# Patient Record
Sex: Female | Born: 1950
Health system: Southern US, Community
[De-identification: ages and names within clinical notes are randomized; demographics above are authoritative.]

## PROBLEM LIST (undated history)

## (undated) DIAGNOSIS — K635 Polyp of colon: Secondary | ICD-10-CM

## (undated) DIAGNOSIS — F329 Major depressive disorder, single episode, unspecified: Secondary | ICD-10-CM

## (undated) DIAGNOSIS — G4730 Sleep apnea, unspecified: Secondary | ICD-10-CM

## (undated) DIAGNOSIS — J189 Pneumonia, unspecified organism: Secondary | ICD-10-CM

## (undated) DIAGNOSIS — N189 Chronic kidney disease, unspecified: Secondary | ICD-10-CM

## (undated) DIAGNOSIS — F32A Depression, unspecified: Secondary | ICD-10-CM

## (undated) DIAGNOSIS — D151 Benign neoplasm of heart: Secondary | ICD-10-CM

## (undated) DIAGNOSIS — H269 Unspecified cataract: Secondary | ICD-10-CM

## (undated) DIAGNOSIS — E785 Hyperlipidemia, unspecified: Secondary | ICD-10-CM

## (undated) DIAGNOSIS — G061 Intraspinal abscess and granuloma: Secondary | ICD-10-CM

## (undated) DIAGNOSIS — K219 Gastro-esophageal reflux disease without esophagitis: Secondary | ICD-10-CM

## (undated) DIAGNOSIS — A4902 Methicillin resistant Staphylococcus aureus infection, unspecified site: Secondary | ICD-10-CM

## (undated) DIAGNOSIS — K5900 Constipation, unspecified: Secondary | ICD-10-CM

## (undated) DIAGNOSIS — Z803 Family history of malignant neoplasm of breast: Secondary | ICD-10-CM

## (undated) DIAGNOSIS — R0902 Hypoxemia: Secondary | ICD-10-CM

## (undated) DIAGNOSIS — Z8042 Family history of malignant neoplasm of prostate: Secondary | ICD-10-CM

## (undated) DIAGNOSIS — I639 Cerebral infarction, unspecified: Secondary | ICD-10-CM

## (undated) DIAGNOSIS — D509 Iron deficiency anemia, unspecified: Secondary | ICD-10-CM

## (undated) DIAGNOSIS — Z1379 Encounter for other screening for genetic and chromosomal anomalies: Principal | ICD-10-CM

## (undated) DIAGNOSIS — I2510 Atherosclerotic heart disease of native coronary artery without angina pectoris: Secondary | ICD-10-CM

## (undated) DIAGNOSIS — C50919 Malignant neoplasm of unspecified site of unspecified female breast: Secondary | ICD-10-CM

## (undated) DIAGNOSIS — I6609 Occlusion and stenosis of unspecified middle cerebral artery: Secondary | ICD-10-CM

## (undated) DIAGNOSIS — M1990 Unspecified osteoarthritis, unspecified site: Secondary | ICD-10-CM

## (undated) DIAGNOSIS — Z923 Personal history of irradiation: Secondary | ICD-10-CM

## (undated) DIAGNOSIS — I10 Essential (primary) hypertension: Secondary | ICD-10-CM

## (undated) DIAGNOSIS — Z8673 Personal history of transient ischemic attack (TIA), and cerebral infarction without residual deficits: Secondary | ICD-10-CM

## (undated) HISTORY — DX: Family history of malignant neoplasm of breast: Z80.3

## (undated) HISTORY — DX: Atherosclerotic heart disease of native coronary artery without angina pectoris: I25.10

## (undated) HISTORY — DX: Intraspinal abscess and granuloma: G06.1

## (undated) HISTORY — DX: Depression, unspecified: F32.A

## (undated) HISTORY — DX: Iron deficiency anemia, unspecified: D50.9

## (undated) HISTORY — DX: Unspecified cataract: H26.9

## (undated) HISTORY — DX: Polyp of colon: K63.5

## (undated) HISTORY — DX: Gastro-esophageal reflux disease without esophagitis: K21.9

## (undated) HISTORY — DX: Benign neoplasm of heart: D15.1

## (undated) HISTORY — DX: Sleep apnea, unspecified: G47.30

## (undated) HISTORY — DX: Personal history of transient ischemic attack (TIA), and cerebral infarction without residual deficits: Z86.73

## (undated) HISTORY — PX: OTHER SURGICAL HISTORY: SHX169

## (undated) HISTORY — DX: Major depressive disorder, single episode, unspecified: F32.9

## (undated) HISTORY — DX: Encounter for other screening for genetic and chromosomal anomalies: Z13.79

## (undated) HISTORY — DX: Occlusion and stenosis of unspecified middle cerebral artery: I66.09

## (undated) HISTORY — DX: Cerebral infarction, unspecified: I63.9

## (undated) HISTORY — DX: Essential (primary) hypertension: I10

## (undated) HISTORY — DX: Unspecified osteoarthritis, unspecified site: M19.90

## (undated) HISTORY — PX: VESICOVAGINAL FISTULA CLOSURE W/ TAH: SUR271

## (undated) HISTORY — DX: Hyperlipidemia, unspecified: E78.5

## (undated) HISTORY — PX: BREAST LUMPECTOMY: SHX2

## (undated) HISTORY — DX: Family history of malignant neoplasm of prostate: Z80.42

---

## 1969-06-27 HISTORY — PX: TONSILLECTOMY: SUR1361

## 1976-06-27 HISTORY — PX: TOTAL ABDOMINAL HYSTERECTOMY: SHX209

## 1998-01-20 ENCOUNTER — Encounter: Admission: RE | Admit: 1998-01-20 | Discharge: 1998-04-20 | Payer: Self-pay | Admitting: Family Medicine

## 1999-04-26 ENCOUNTER — Ambulatory Visit (HOSPITAL_COMMUNITY): Admission: RE | Admit: 1999-04-26 | Discharge: 1999-04-26 | Payer: Self-pay | Admitting: Orthopedic Surgery

## 1999-04-26 ENCOUNTER — Encounter: Payer: Self-pay | Admitting: Orthopedic Surgery

## 1999-05-18 ENCOUNTER — Ambulatory Visit (HOSPITAL_COMMUNITY): Admission: RE | Admit: 1999-05-18 | Discharge: 1999-05-18 | Payer: Self-pay | Admitting: Family Medicine

## 1999-05-18 ENCOUNTER — Encounter: Payer: Self-pay | Admitting: Family Medicine

## 1999-12-05 ENCOUNTER — Emergency Department (HOSPITAL_COMMUNITY): Admission: EM | Admit: 1999-12-05 | Discharge: 1999-12-05 | Payer: Self-pay | Admitting: Emergency Medicine

## 1999-12-05 ENCOUNTER — Encounter: Payer: Self-pay | Admitting: Emergency Medicine

## 2000-03-02 ENCOUNTER — Ambulatory Visit (HOSPITAL_COMMUNITY): Admission: RE | Admit: 2000-03-02 | Discharge: 2000-03-02 | Payer: Self-pay | Admitting: Family Medicine

## 2000-03-02 ENCOUNTER — Encounter: Payer: Self-pay | Admitting: Family Medicine

## 2000-12-28 ENCOUNTER — Encounter: Payer: Self-pay | Admitting: Emergency Medicine

## 2000-12-28 ENCOUNTER — Emergency Department (HOSPITAL_COMMUNITY): Admission: EM | Admit: 2000-12-28 | Discharge: 2000-12-29 | Payer: Self-pay | Admitting: Emergency Medicine

## 2002-03-11 ENCOUNTER — Emergency Department (HOSPITAL_COMMUNITY): Admission: EM | Admit: 2002-03-11 | Discharge: 2002-03-11 | Payer: Self-pay | Admitting: Emergency Medicine

## 2002-03-11 ENCOUNTER — Encounter: Payer: Self-pay | Admitting: Emergency Medicine

## 2003-08-15 ENCOUNTER — Emergency Department (HOSPITAL_COMMUNITY): Admission: EM | Admit: 2003-08-15 | Discharge: 2003-08-15 | Payer: Self-pay | Admitting: Emergency Medicine

## 2006-03-24 ENCOUNTER — Ambulatory Visit: Payer: Self-pay | Admitting: Internal Medicine

## 2006-03-24 ENCOUNTER — Inpatient Hospital Stay (HOSPITAL_COMMUNITY): Admission: EM | Admit: 2006-03-24 | Discharge: 2006-04-07 | Payer: Self-pay | Admitting: Emergency Medicine

## 2006-03-27 DIAGNOSIS — G061 Intraspinal abscess and granuloma: Secondary | ICD-10-CM

## 2006-03-27 HISTORY — DX: Intraspinal abscess and granuloma: G06.1

## 2006-04-04 ENCOUNTER — Encounter (INDEPENDENT_AMBULATORY_CARE_PROVIDER_SITE_OTHER): Payer: Self-pay | Admitting: Specialist

## 2006-05-05 ENCOUNTER — Ambulatory Visit: Payer: Self-pay | Admitting: Internal Medicine

## 2006-05-05 ENCOUNTER — Encounter (INDEPENDENT_AMBULATORY_CARE_PROVIDER_SITE_OTHER): Payer: Self-pay | Admitting: Internal Medicine

## 2006-05-05 LAB — CONVERTED CEMR LAB
BUN: 12 mg/dL (ref 6–23)
Chloride: 106 meq/L (ref 96–112)
Creatinine, Ser: 0.7 mg/dL (ref 0.40–1.20)
HCT: 34.6 % (ref 34.4–43.3)
MCHC: 33.5 g/dL (ref 33.1–35.4)
Platelets: 250 10*3/uL (ref 152–374)
Potassium: 4.6 meq/L (ref 3.5–5.3)
RDW: 14.7 % (ref 11.5–15.3)

## 2006-09-29 ENCOUNTER — Encounter: Admission: RE | Admit: 2006-09-29 | Discharge: 2006-09-29 | Payer: Self-pay | Admitting: *Deleted

## 2006-09-29 ENCOUNTER — Ambulatory Visit: Payer: Self-pay | Admitting: *Deleted

## 2006-09-29 ENCOUNTER — Ambulatory Visit: Payer: Self-pay | Admitting: Internal Medicine

## 2006-09-29 ENCOUNTER — Inpatient Hospital Stay (HOSPITAL_COMMUNITY): Admission: AD | Admit: 2006-09-29 | Discharge: 2006-10-04 | Payer: Self-pay | Admitting: Internal Medicine

## 2006-09-29 DIAGNOSIS — D631 Anemia in chronic kidney disease: Secondary | ICD-10-CM | POA: Insufficient documentation

## 2006-09-29 DIAGNOSIS — E119 Type 2 diabetes mellitus without complications: Secondary | ICD-10-CM | POA: Insufficient documentation

## 2006-09-29 DIAGNOSIS — L03818 Cellulitis of other sites: Secondary | ICD-10-CM

## 2006-09-29 DIAGNOSIS — D509 Iron deficiency anemia, unspecified: Secondary | ICD-10-CM

## 2006-09-29 DIAGNOSIS — L02818 Cutaneous abscess of other sites: Secondary | ICD-10-CM

## 2006-09-29 DIAGNOSIS — I10 Essential (primary) hypertension: Secondary | ICD-10-CM

## 2006-09-29 LAB — CONVERTED CEMR LAB
Blood Glucose, Fingerstick: 341
Hgb A1c MFr Bld: 9.4 %

## 2006-10-12 ENCOUNTER — Ambulatory Visit: Payer: Self-pay | Admitting: Internal Medicine

## 2006-10-12 LAB — CONVERTED CEMR LAB: Blood Glucose, Fingerstick: 174

## 2006-10-17 ENCOUNTER — Ambulatory Visit: Payer: Self-pay | Admitting: Internal Medicine

## 2006-12-14 ENCOUNTER — Encounter (INDEPENDENT_AMBULATORY_CARE_PROVIDER_SITE_OTHER): Payer: Self-pay | Admitting: Internal Medicine

## 2007-03-30 ENCOUNTER — Encounter: Admission: RE | Admit: 2007-03-30 | Discharge: 2007-03-30 | Payer: Self-pay | Admitting: Internal Medicine

## 2007-05-19 ENCOUNTER — Emergency Department (HOSPITAL_COMMUNITY): Admission: EM | Admit: 2007-05-19 | Discharge: 2007-05-19 | Payer: Self-pay | Admitting: Family Medicine

## 2007-08-20 ENCOUNTER — Encounter: Admission: RE | Admit: 2007-08-20 | Discharge: 2007-08-20 | Payer: Self-pay | Admitting: Internal Medicine

## 2009-01-07 ENCOUNTER — Emergency Department (HOSPITAL_COMMUNITY): Admission: EM | Admit: 2009-01-07 | Discharge: 2009-01-07 | Payer: Self-pay | Admitting: Emergency Medicine

## 2009-10-19 ENCOUNTER — Encounter: Admission: RE | Admit: 2009-10-19 | Discharge: 2009-10-19 | Payer: Self-pay | Admitting: Internal Medicine

## 2009-12-12 ENCOUNTER — Ambulatory Visit: Payer: Self-pay | Admitting: Surgery

## 2009-12-21 ENCOUNTER — Ambulatory Visit: Payer: Self-pay | Admitting: Cardiology

## 2009-12-21 ENCOUNTER — Inpatient Hospital Stay (HOSPITAL_COMMUNITY)
Admission: EM | Admit: 2009-12-21 | Discharge: 2009-12-30 | Payer: Self-pay | Source: Home / Self Care | Admitting: Emergency Medicine

## 2009-12-22 ENCOUNTER — Encounter (INDEPENDENT_AMBULATORY_CARE_PROVIDER_SITE_OTHER): Payer: Self-pay | Admitting: Internal Medicine

## 2009-12-23 ENCOUNTER — Encounter: Payer: Self-pay | Admitting: Cardiology

## 2009-12-24 ENCOUNTER — Ambulatory Visit: Payer: Self-pay | Admitting: Physical Medicine & Rehabilitation

## 2010-02-08 ENCOUNTER — Ambulatory Visit: Payer: Self-pay | Admitting: Internal Medicine

## 2010-02-08 LAB — CONVERTED CEMR LAB
ALT: 13 units/L (ref 0–35)
AST: 17 units/L (ref 0–37)
Alkaline Phosphatase: 48 units/L (ref 39–117)
Anticardiolipin IgM: 1 (ref <11)
BUN: 22 mg/dL (ref 6–23)
Basophils Absolute: 0 10*3/uL (ref 0.0–0.1)
Basophils Relative: 1 % (ref 0–1)
Calcium: 9.5 mg/dL (ref 8.4–10.5)
Creatinine, Ser: 0.99 mg/dL (ref 0.40–1.20)
Eosinophils Absolute: 0.1 10*3/uL (ref 0.0–0.7)
Eosinophils Relative: 1 % (ref 0–5)
Hemoglobin: 11.8 g/dL — ABNORMAL LOW (ref 12.0–15.0)
MCHC: 31.3 g/dL (ref 30.0–36.0)
MCV: 82.9 fL (ref 78.0–100.0)
Monocytes Absolute: 0.3 10*3/uL (ref 0.1–1.0)
Monocytes Relative: 5 % (ref 3–12)
Neutro Abs: 2.9 10*3/uL (ref 1.7–7.7)
RBC: 4.55 M/uL (ref 3.87–5.11)
RDW: 15.1 % (ref 11.5–15.5)
Total Bilirubin: 0.4 mg/dL (ref 0.3–1.2)

## 2010-03-12 ENCOUNTER — Encounter (INDEPENDENT_AMBULATORY_CARE_PROVIDER_SITE_OTHER): Payer: Self-pay | Admitting: Internal Medicine

## 2010-03-12 LAB — CONVERTED CEMR LAB
ALT: 12 units/L (ref 0–35)
AST: 16 units/L (ref 0–37)
CO2: 24 meq/L (ref 19–32)
Calcium: 9.1 mg/dL (ref 8.4–10.5)
Chloride: 110 meq/L (ref 96–112)
Creatinine, Ser: 0.94 mg/dL (ref 0.40–1.20)
INR: 1.05 (ref <1.50)
Potassium: 5 meq/L (ref 3.5–5.3)
Prothrombin Time: 13.9 s (ref 11.6–15.2)
Sodium: 143 meq/L (ref 135–145)
Total CHOL/HDL Ratio: 2.9
Total Protein: 6.6 g/dL (ref 6.0–8.3)
VLDL: 19 mg/dL (ref 0–40)

## 2010-03-16 ENCOUNTER — Encounter (INDEPENDENT_AMBULATORY_CARE_PROVIDER_SITE_OTHER): Payer: Self-pay | Admitting: Internal Medicine

## 2010-03-16 LAB — CONVERTED CEMR LAB
INR: 1.05 (ref <1.50)
Prothrombin Time: 13.9 s (ref 11.6–15.2)

## 2010-05-28 ENCOUNTER — Encounter (INDEPENDENT_AMBULATORY_CARE_PROVIDER_SITE_OTHER): Payer: Self-pay | Admitting: Family Medicine

## 2010-05-28 LAB — CONVERTED CEMR LAB
ALT: 16 units/L (ref 0–35)
AST: 17 units/L (ref 0–37)
Albumin: 4.1 g/dL (ref 3.5–5.2)
CO2: 26 meq/L (ref 19–32)
Calcium: 9 mg/dL (ref 8.4–10.5)
Chloride: 107 meq/L (ref 96–112)
Cholesterol: 237 mg/dL — ABNORMAL HIGH (ref 0–200)
Hemoglobin: 10.9 g/dL — ABNORMAL LOW (ref 12.0–15.0)
Platelets: 268 10*3/uL (ref 150–400)
Potassium: 4.7 meq/L (ref 3.5–5.3)
RDW: 15.6 % — ABNORMAL HIGH (ref 11.5–15.5)
WBC: 7.4 10*3/uL (ref 4.0–10.5)

## 2010-06-03 ENCOUNTER — Emergency Department (HOSPITAL_COMMUNITY): Admission: EM | Admit: 2010-06-03 | Discharge: 2009-10-05 | Payer: Self-pay | Admitting: Emergency Medicine

## 2010-06-04 ENCOUNTER — Inpatient Hospital Stay (HOSPITAL_COMMUNITY)
Admission: EM | Admit: 2010-06-04 | Discharge: 2010-06-09 | Payer: Self-pay | Source: Home / Self Care | Attending: Internal Medicine | Admitting: Internal Medicine

## 2010-06-04 ENCOUNTER — Encounter: Payer: Self-pay | Admitting: Cardiology

## 2010-06-07 ENCOUNTER — Encounter (INDEPENDENT_AMBULATORY_CARE_PROVIDER_SITE_OTHER): Payer: Self-pay | Admitting: Internal Medicine

## 2010-06-23 ENCOUNTER — Ambulatory Visit (HOSPITAL_COMMUNITY)
Admission: RE | Admit: 2010-06-23 | Discharge: 2010-06-23 | Payer: Self-pay | Source: Home / Self Care | Attending: Cardiology | Admitting: Cardiology

## 2010-06-23 ENCOUNTER — Encounter (INDEPENDENT_AMBULATORY_CARE_PROVIDER_SITE_OTHER): Payer: Self-pay | Admitting: *Deleted

## 2010-06-27 DIAGNOSIS — D151 Benign neoplasm of heart: Secondary | ICD-10-CM

## 2010-06-27 HISTORY — PX: CORONARY ARTERY BYPASS GRAFT: SHX141

## 2010-06-27 HISTORY — DX: Benign neoplasm of heart: D15.1

## 2010-06-27 HISTORY — PX: OTHER SURGICAL HISTORY: SHX169

## 2010-06-29 ENCOUNTER — Ambulatory Visit
Admission: RE | Admit: 2010-06-29 | Discharge: 2010-06-29 | Payer: Self-pay | Source: Home / Self Care | Attending: Cardiology | Admitting: Cardiology

## 2010-06-29 ENCOUNTER — Other Ambulatory Visit: Payer: Self-pay | Admitting: Cardiology

## 2010-06-29 DIAGNOSIS — E7800 Pure hypercholesterolemia, unspecified: Secondary | ICD-10-CM | POA: Insufficient documentation

## 2010-06-29 DIAGNOSIS — G459 Transient cerebral ischemic attack, unspecified: Secondary | ICD-10-CM | POA: Insufficient documentation

## 2010-06-29 DIAGNOSIS — E785 Hyperlipidemia, unspecified: Secondary | ICD-10-CM | POA: Insufficient documentation

## 2010-06-29 LAB — PROTIME-INR
INR: 1.1 ratio — ABNORMAL HIGH (ref 0.8–1.0)
Prothrombin Time: 12 s — ABNORMAL HIGH (ref 9.7–11.8)

## 2010-07-12 ENCOUNTER — Ambulatory Visit
Admission: RE | Admit: 2010-07-12 | Discharge: 2010-07-12 | Payer: Self-pay | Source: Home / Self Care | Attending: Thoracic Surgery (Cardiothoracic Vascular Surgery) | Admitting: Thoracic Surgery (Cardiothoracic Vascular Surgery)

## 2010-07-14 ENCOUNTER — Encounter
Admission: RE | Admit: 2010-07-14 | Discharge: 2010-07-14 | Payer: Self-pay | Source: Home / Self Care | Attending: Dentistry | Admitting: Dentistry

## 2010-07-14 ENCOUNTER — Encounter: Payer: Self-pay | Admitting: Cardiology

## 2010-07-18 ENCOUNTER — Encounter: Payer: Self-pay | Admitting: Internal Medicine

## 2010-07-19 ENCOUNTER — Inpatient Hospital Stay (HOSPITAL_COMMUNITY)
Admission: RE | Admit: 2010-07-19 | Discharge: 2010-07-28 | DRG: 229 | Disposition: A | Discharge status: Home Health | Payer: Medicaid Other | Attending: Thoracic Surgery (Cardiothoracic Vascular Surgery) | Admitting: Thoracic Surgery (Cardiothoracic Vascular Surgery)

## 2010-07-19 DIAGNOSIS — G4733 Obstructive sleep apnea (adult) (pediatric): Secondary | ICD-10-CM | POA: Diagnosis present

## 2010-07-19 DIAGNOSIS — I10 Essential (primary) hypertension: Secondary | ICD-10-CM | POA: Diagnosis present

## 2010-07-19 DIAGNOSIS — Z7982 Long term (current) use of aspirin: Secondary | ICD-10-CM

## 2010-07-19 DIAGNOSIS — D62 Acute posthemorrhagic anemia: Secondary | ICD-10-CM | POA: Diagnosis not present

## 2010-07-19 DIAGNOSIS — E785 Hyperlipidemia, unspecified: Secondary | ICD-10-CM | POA: Diagnosis present

## 2010-07-19 DIAGNOSIS — F17200 Nicotine dependence, unspecified, uncomplicated: Secondary | ICD-10-CM | POA: Diagnosis present

## 2010-07-19 DIAGNOSIS — I424 Endocardial fibroelastosis: Secondary | ICD-10-CM | POA: Diagnosis present

## 2010-07-19 DIAGNOSIS — Z7901 Long term (current) use of anticoagulants: Secondary | ICD-10-CM

## 2010-07-19 DIAGNOSIS — I2510 Atherosclerotic heart disease of native coronary artery without angina pectoris: Principal | ICD-10-CM | POA: Diagnosis present

## 2010-07-19 DIAGNOSIS — E1169 Type 2 diabetes mellitus with other specified complication: Secondary | ICD-10-CM | POA: Diagnosis present

## 2010-07-19 DIAGNOSIS — D696 Thrombocytopenia, unspecified: Secondary | ICD-10-CM | POA: Diagnosis not present

## 2010-07-19 DIAGNOSIS — Z8673 Personal history of transient ischemic attack (TIA), and cerebral infarction without residual deficits: Secondary | ICD-10-CM

## 2010-07-20 LAB — COMPREHENSIVE METABOLIC PANEL
ALT: 17 U/L (ref 0–35)
AST: 20 U/L (ref 0–37)
CO2: 25 mEq/L (ref 19–32)
Chloride: 107 mEq/L (ref 96–112)
GFR calc Af Amer: >60 mL/min (ref >60)
GFR calc non Af Amer: 58 mL/min — ABNORMAL LOW (ref >60)
Glucose, Bld: 138 mg/dL — ABNORMAL HIGH (ref 70–99)
Sodium: 140 mEq/L (ref 135–145)
Total Bilirubin: 0.7 mg/dL (ref 0.3–1.2)

## 2010-07-20 LAB — GLUCOSE, CAPILLARY
Glucose-Capillary: 137 mg/dL — ABNORMAL HIGH (ref 70–99)
Glucose-Capillary: 130 mg/dL — ABNORMAL HIGH (ref 70–99)
Glucose-Capillary: 194 mg/dL — ABNORMAL HIGH (ref 70–99)

## 2010-07-20 LAB — PROTIME-INR
INR: 0.89 (ref 0.00–1.49)
Prothrombin Time: 12.2 seconds (ref 11.6–15.2)

## 2010-07-20 LAB — CBC
Hemoglobin: 11.2 g/dL — ABNORMAL LOW (ref 12.0–15.0)
RBC: 4.27 MIL/uL (ref 3.87–5.11)

## 2010-07-20 LAB — DIFFERENTIAL
Basophils Absolute: 0 10*3/uL (ref 0.0–0.1)
Basophils Relative: 1 % (ref 0–1)
Neutro Abs: 2.9 10*3/uL (ref 1.7–7.7)
Neutrophils Relative %: 47 % (ref 43–77)

## 2010-07-21 ENCOUNTER — Other Ambulatory Visit: Payer: Self-pay | Admitting: Thoracic Surgery (Cardiothoracic Vascular Surgery)

## 2010-07-21 LAB — BLOOD GAS, ARTERIAL
Acid-base deficit: 1.3 mmol/L (ref 0.0–2.0)
Bicarbonate: 22.1 mEq/L (ref 20.0–24.0)
FIO2: 0.21 %
O2 Saturation: 96.7 %
pO2, Arterial: 88.9 mmHg (ref 80.0–100.0)

## 2010-07-21 LAB — POCT I-STAT 4, (NA,K, GLUC, HGB,HCT)
Glucose, Bld: 125 mg/dL — ABNORMAL HIGH (ref 70–99)
Glucose, Bld: 100 mg/dL — ABNORMAL HIGH (ref 70–99)
HCT: 33 % — ABNORMAL LOW (ref 36.0–46.0)
HCT: 23 % — ABNORMAL LOW (ref 36.0–46.0)
HCT: 21 % — ABNORMAL LOW (ref 36.0–46.0)
Hemoglobin: 7.8 g/dL — ABNORMAL LOW (ref 12.0–15.0)
Hemoglobin: 7.8 g/dL — ABNORMAL LOW (ref 12.0–15.0)
Potassium: 4.2 mEq/L (ref 3.5–5.1)
Potassium: 4.2 mEq/L (ref 3.5–5.1)
Potassium: 4.1 mEq/L (ref 3.5–5.1)
Potassium: 3.7 mEq/L (ref 3.5–5.1)
Sodium: 136 mEq/L (ref 135–145)
Sodium: 138 mEq/L (ref 135–145)
Sodium: 137 mEq/L (ref 135–145)
Sodium: 138 mEq/L (ref 135–145)
Sodium: 140 mEq/L (ref 135–145)

## 2010-07-21 LAB — URINALYSIS, ROUTINE W REFLEX MICROSCOPIC
Nitrite: NEGATIVE
Protein, ur: NEGATIVE mg/dL
Specific Gravity, Urine: 1.022 (ref 1.005–1.030)
Urobilinogen, UA: 1 mg/dL (ref 0.0–1.0)

## 2010-07-21 LAB — POCT I-STAT 3, ART BLOOD GAS (G3+)
Acid-base deficit: 2 mmol/L (ref 0.0–2.0)
Acid-base deficit: 5 mmol/L — ABNORMAL HIGH (ref 0.0–2.0)
O2 Saturation: 100 %
O2 Saturation: 97 %
Patient temperature: 37.3
pCO2 arterial: 38.5 mmHg (ref 35.0–45.0)
pO2, Arterial: 552 mmHg — ABNORMAL HIGH (ref 80.0–100.0)
pO2, Arterial: 354 mmHg — ABNORMAL HIGH (ref 80.0–100.0)

## 2010-07-21 LAB — CBC
HCT: 30.2 % — ABNORMAL LOW (ref 36.0–46.0)
Hemoglobin: 11 g/dL — ABNORMAL LOW (ref 12.0–15.0)
MCH: 26.2 pg (ref 26.0–34.0)
MCHC: 32.6 g/dL (ref 30.0–36.0)
MCHC: 33.8 g/dL (ref 30.0–36.0)
MCV: 81.4 fL (ref 78.0–100.0)
Platelets: 127 10*3/uL — ABNORMAL LOW (ref 150–400)
Platelets: 130 10*3/uL — ABNORMAL LOW (ref 150–400)
RBC: 2.76 MIL/uL — ABNORMAL LOW (ref 3.87–5.11)
RDW: 14.8 % (ref 11.5–15.5)
RDW: 14.6 % (ref 11.5–15.5)
RDW: 14.6 % (ref 11.5–15.5)
WBC: 6.1 10*3/uL (ref 4.0–10.5)
WBC: 10.2 10*3/uL (ref 4.0–10.5)

## 2010-07-21 LAB — COMPREHENSIVE METABOLIC PANEL
ALT: 16 U/L (ref 0–35)
Alkaline Phosphatase: 44 U/L (ref 39–117)
CO2: 22 mEq/L (ref 19–32)
GFR calc non Af Amer: >60 mL/min (ref >60)
Glucose, Bld: 139 mg/dL — ABNORMAL HIGH (ref 70–99)
Potassium: 4.8 mEq/L (ref 3.5–5.1)
Sodium: 140 mEq/L (ref 135–145)
Total Bilirubin: 0.5 mg/dL (ref 0.3–1.2)

## 2010-07-21 LAB — URINE MICROSCOPIC-ADD ON

## 2010-07-21 LAB — GLUCOSE, CAPILLARY
Glucose-Capillary: 116 mg/dL — ABNORMAL HIGH (ref 70–99)
Glucose-Capillary: 211 mg/dL — ABNORMAL HIGH (ref 70–99)
Glucose-Capillary: 172 mg/dL — ABNORMAL HIGH (ref 70–99)
Glucose-Capillary: 141 mg/dL — ABNORMAL HIGH (ref 70–99)
Glucose-Capillary: 139 mg/dL — ABNORMAL HIGH (ref 70–99)
Glucose-Capillary: 93 mg/dL (ref 70–99)
Glucose-Capillary: 98 mg/dL (ref 70–99)
Glucose-Capillary: 134 mg/dL — ABNORMAL HIGH (ref 70–99)
Glucose-Capillary: 180 mg/dL — ABNORMAL HIGH (ref 70–99)

## 2010-07-21 LAB — GLUCOSE, POCT (MANUAL RESULT ENTRY): Operator id: 3408

## 2010-07-21 LAB — POCT I-STAT, CHEM 8
Calcium, Ion: 1.12 mmol/L (ref 1.12–1.32)
Glucose, Bld: 190 mg/dL — ABNORMAL HIGH (ref 70–99)
HCT: 31 % — ABNORMAL LOW (ref 36.0–46.0)
TCO2: 21 mmol/L (ref 0–100)

## 2010-07-21 LAB — BASIC METABOLIC PANEL
BUN: 16 mg/dL (ref 6–23)
Calcium: 8.9 mg/dL (ref 8.4–10.5)
Creatinine, Ser: 0.94 mg/dL (ref 0.4–1.2)
GFR calc non Af Amer: >60 mL/min (ref >60)
Glucose, Bld: 188 mg/dL — ABNORMAL HIGH (ref 70–99)
Potassium: 4.2 mEq/L (ref 3.5–5.1)

## 2010-07-21 LAB — APTT: aPTT: 30 seconds (ref 24–37)

## 2010-07-21 LAB — CREATININE, SERUM
Creatinine, Ser: 0.78 mg/dL (ref 0.4–1.2)
GFR calc non Af Amer: >60 mL/min (ref >60)

## 2010-07-21 LAB — MRSA PCR SCREENING: MRSA by PCR: NEGATIVE

## 2010-07-21 LAB — ABO/RH: ABO/RH(D): AB POS

## 2010-07-21 LAB — PROTIME-INR
INR: 1.27 (ref 0.00–1.49)
Prothrombin Time: 16.1 seconds — ABNORMAL HIGH (ref 11.6–15.2)

## 2010-07-21 LAB — PLATELET COUNT: Platelets: 134 10*3/uL — ABNORMAL LOW (ref 150–400)

## 2010-07-21 LAB — HEMOGLOBIN AND HEMATOCRIT, BLOOD: HCT: 21.1 % — ABNORMAL LOW (ref 36.0–46.0)

## 2010-07-22 LAB — POCT I-STAT, CHEM 8
Glucose, Bld: 114 mg/dL — ABNORMAL HIGH (ref 70–99)
HCT: 33 % — ABNORMAL LOW (ref 36.0–46.0)
Hemoglobin: 11.2 g/dL — ABNORMAL LOW (ref 12.0–15.0)
Potassium: 4 mEq/L (ref 3.5–5.1)
TCO2: 25 mmol/L (ref 0–100)

## 2010-07-22 LAB — CBC
Hemoglobin: 10.5 g/dL — ABNORMAL LOW (ref 12.0–15.0)
MCHC: 33.1 g/dL (ref 30.0–36.0)
MCV: 81.5 fL (ref 78.0–100.0)
Platelets: 140 10*3/uL — ABNORMAL LOW (ref 150–400)
Platelets: 146 10*3/uL — ABNORMAL LOW (ref 150–400)
RBC: 3.67 MIL/uL — ABNORMAL LOW (ref 3.87–5.11)
RDW: 14.5 % (ref 11.5–15.5)
RDW: 14.9 % (ref 11.5–15.5)
WBC: 12.8 10*3/uL — ABNORMAL HIGH (ref 4.0–10.5)

## 2010-07-22 LAB — GLUCOSE, CAPILLARY
Glucose-Capillary: 156 mg/dL — ABNORMAL HIGH (ref 70–99)
Glucose-Capillary: 112 mg/dL — ABNORMAL HIGH (ref 70–99)

## 2010-07-22 LAB — BASIC METABOLIC PANEL
Chloride: 113 mEq/L — ABNORMAL HIGH (ref 96–112)
GFR calc Af Amer: >60 mL/min (ref >60)
GFR calc non Af Amer: >60 mL/min (ref >60)
Potassium: 4.5 mEq/L (ref 3.5–5.1)
Sodium: 141 mEq/L (ref 135–145)

## 2010-07-22 LAB — POCT I-STAT 3, ART BLOOD GAS (G3+)
Acid-base deficit: 4 mmol/L — ABNORMAL HIGH (ref 0.0–2.0)
Bicarbonate: 20.3 mEq/L (ref 20.0–24.0)
O2 Saturation: 94 %
pO2, Arterial: 68 mmHg — ABNORMAL LOW (ref 80.0–100.0)

## 2010-07-22 LAB — TYPE AND SCREEN
ABO/RH(D): AB POS
Unit division: 0

## 2010-07-22 LAB — CREATININE, SERUM
Creatinine, Ser: 0.97 mg/dL (ref 0.4–1.2)
GFR calc Af Amer: >60 mL/min (ref >60)

## 2010-07-22 LAB — MAGNESIUM: Magnesium: 3 mg/dL — ABNORMAL HIGH (ref 1.5–2.5)

## 2010-07-22 NOTE — Consult Note (Addendum)
  [name], [name]              ACCOUNT NO.:  [id number]  MEDICAL RECORD NO.:  [id number]          PATIENT TYPE:  INP  LOCATION:  2009                         FACILITY:  [place]  PHYSICIAN:  Minus Breeding, MD, FACCDATE OF BIRTH:  1950/08/18  DATE OF CONSULTATION:  07/19/2010 DATE OF DISCHARGE:                                CONSULTATION   PRIMARY:  [name], MD  CARDIOLOGIST:  [name]. [name], MD, [facility]  PROCEDURE:  Left and right heart catheterization.  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  PROCEDURE NOTE:  Left heart catheterization was performed via the left femoral artery, right heart catheterization was performed via the left femoral vein.  Both vessels were cannulated using anterior wall puncture.  A #5-French arterial sheath and a #7-French venous sheath were inserted via the modified Seldinger technique.  Preformed Judkins and pigtail catheter were utilized.  The patient tolerated the procedure well and left the lab in stable condition.  RESULTS:  Hemodynamics:  RA mean 8, RV 45/6, PA 29/14 with a mean of 20, pulmonary capillary pressure mean 10, aorta 143/72.  Coronaries:  The left main was normal.  The LAD had proximal luminal irregularities.  There were luminal irregularities throughout the mid and distal vessel.  First diagonal was moderate sized with proximal 25% stenosis.  Second diagonal was moderate sized with long 70% stenosis. The circumflex had a long proximal 30-40% lesions.  There was a mid obtuse marginal which was large and branching with long proximal 40% stenosis.  Right coronary artery was a dominant vessel.  It was occluded in the distal segment before the PDA.  The PDA was large and appeared to be free of high-grade disease.  There was reasonable collateral flow from the LAD, circumflex to the right coronary artery.  Left ventricle:  The left ventricle was not injected and the valve was not crossed  secondary to the fibroelastoma.  CONCLUSION:  Known mitral valve fibroelastoma.  Two-vessel coronary artery disease including diagonal and right coronary artery.  PLAN:  The patient will have mitral valve repair and CABG by Dr. [name].     Minus Breeding, MD, [facility]     JH/MEDQ  D:  07/19/2010  T:  07/20/2010  Job:  [id number]  Electronically Signed by Minus Breeding MD [facility] on 07/22/2010 12:40:56 PM

## 2010-07-23 LAB — BASIC METABOLIC PANEL
CO2: 26 mEq/L (ref 19–32)
Calcium: 8.4 mg/dL (ref 8.4–10.5)
Chloride: 105 mEq/L (ref 96–112)
Creatinine, Ser: 0.97 mg/dL (ref 0.4–1.2)
Glucose, Bld: 111 mg/dL — ABNORMAL HIGH (ref 70–99)

## 2010-07-23 LAB — CBC
MCH: 27.2 pg (ref 26.0–34.0)
MCHC: 33.1 g/dL (ref 30.0–36.0)
MCV: 82.1 fL (ref 78.0–100.0)
Platelets: 130 10*3/uL — ABNORMAL LOW (ref 150–400)
RDW: 15.1 % (ref 11.5–15.5)
WBC: 11.5 10*3/uL — ABNORMAL HIGH (ref 4.0–10.5)

## 2010-07-23 LAB — GLUCOSE, CAPILLARY: Glucose-Capillary: 130 mg/dL — ABNORMAL HIGH (ref 70–99)

## 2010-07-24 LAB — GLUCOSE, CAPILLARY
Glucose-Capillary: 59 mg/dL — ABNORMAL LOW (ref 70–99)
Glucose-Capillary: 115 mg/dL — ABNORMAL HIGH (ref 70–99)
Glucose-Capillary: 32 mg/dL — CL (ref 70–99)
Glucose-Capillary: 45 mg/dL — ABNORMAL LOW (ref 70–99)

## 2010-07-24 LAB — BASIC METABOLIC PANEL
Chloride: 105 mEq/L (ref 96–112)
GFR calc non Af Amer: 57 mL/min — ABNORMAL LOW (ref >60)
Glucose, Bld: 68 mg/dL — ABNORMAL LOW (ref 70–99)
Potassium: 3.9 mEq/L (ref 3.5–5.1)
Sodium: 139 mEq/L (ref 135–145)

## 2010-07-24 LAB — CBC
HCT: 29.3 % — ABNORMAL LOW (ref 36.0–46.0)
Hemoglobin: 9.6 g/dL — ABNORMAL LOW (ref 12.0–15.0)
MCV: 84 fL (ref 78.0–100.0)
RBC: 3.49 MIL/uL — ABNORMAL LOW (ref 3.87–5.11)
WBC: 10.9 10*3/uL — ABNORMAL HIGH (ref 4.0–10.5)

## 2010-07-25 LAB — GLUCOSE, CAPILLARY
Glucose-Capillary: 42 mg/dL — CL (ref 70–99)
Glucose-Capillary: 157 mg/dL — ABNORMAL HIGH (ref 70–99)
Glucose-Capillary: 101 mg/dL — ABNORMAL HIGH (ref 70–99)

## 2010-07-25 LAB — PROTIME-INR: INR: 0.97 (ref 0.00–1.49)

## 2010-07-26 LAB — GLUCOSE, CAPILLARY
Glucose-Capillary: 58 mg/dL — ABNORMAL LOW (ref 70–99)
Glucose-Capillary: 51 mg/dL — ABNORMAL LOW (ref 70–99)
Glucose-Capillary: 105 mg/dL — ABNORMAL HIGH (ref 70–99)
Glucose-Capillary: 114 mg/dL — ABNORMAL HIGH (ref 70–99)

## 2010-07-27 LAB — PROTIME-INR
INR: 1.08 (ref 0.00–1.49)
Prothrombin Time: 14.2 seconds (ref 11.6–15.2)

## 2010-07-27 LAB — GLUCOSE, CAPILLARY: Glucose-Capillary: 180 mg/dL — ABNORMAL HIGH (ref 70–99)

## 2010-07-28 LAB — GLUCOSE, CAPILLARY

## 2010-07-28 LAB — PROTIME-INR
INR: 1.16 (ref 0.00–1.49)
Prothrombin Time: 15 seconds (ref 11.6–15.2)

## 2010-07-29 NOTE — Discharge Summary (Signed)
[name], [name]              ACCOUNT NO.:  [id number]  MEDICAL RECORD NO.:  [id number]          PATIENT TYPE:  INP  LOCATION:  2017                         FACILITY:  [place]  PHYSICIAN:  Valentina Gu. [name], M.D. DATE OF BIRTH:  05-14-1951  DATE OF ADMISSION:  07/19/2010 DATE OF DISCHARGE:  07/27/2010                              DISCHARGE SUMMARY   ADMITTING DIAGNOSES: 1. Coronary artery vessel disease. 2. Mitral valve mass. 3. 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 4. History of type 2 diabetes mellitus. 5. History of hyperlipidemia. 6. History of hypertension. 7. History of tobacco abuse.  DISCHARGE DIAGNOSES: 1. Coronary artery vessel disease. 2. Mitral valve mass. 3. 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 4. History of type 2 diabetes mellitus. 5. History of hyperlipidemia. 6. History of hypertension. 7. History of tobacco abuse. 8. Acute blood loss anemia. 9. Mild postoperative thrombocytopenia.  PROCEDURE:  Median sternotomy for resection of mitral valve mass, mitral valve repair using CorMatrix patch, CABG x2 (SVG to diagonal 1, SVG to posterior descending coronary artery, coronary endarterectomy of the posterior descending coronary artery), EVH from the right thigh by Dr. [name] on July 21, 2010.  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  HISTORY OF PRESENT ILLNESS:  This is a 60 year old African American female with the aforementioned past medical history, the most recent was hospitalized in June 2011 at [facility] for an acute embolic stroke. MRI and MRA of the brain revealed multiple puncture with areas of the right posterior frontal, right parietal and right occipital lobes consistent with an acute embolic infarction.  There were other scattered areas of small vessel disease as well.  MRA revealed what was felt to be a high-grade stenosis of the proximal right  middle cerebral artery.  TEE done at that time revealed the presence of a small mass attached in the atrial surface of the middle scalp with the posterior leaflet of mitral valve (likely consistent with a benign tumor).  The patient was eventually discharged home on Coumadin as well as physical therapy.  She gradually recovered from her initial stroke, although she did present in December with episodes of increased slurred speech as well as numbness and clumsiness of the left hand.  These symptoms apparently were transient and it was found that the patient had not been noncompliant with Coumadin at that time.  A repeat MRI did not reveal any new findings.  Again, the patient's symptoms resolved and she was discharged home.  She was then seen by Dr. Shawn Stall  with a [facility] and was eventually referred to Dr. [name] regarding further followup of the mitral valve mass.  Repeat transesophageal echocardiogram was done on December 28.  This confirmed the presence of a mass in the atrial surface of posterior leaflet of mitral valve suspicious for a benign papillary fibroelastoma.  There was mild mitral regurgitation, normal left ventricular function and there were no other abnormalities noted.  The patient was then seen in consultation by Dr. [name] on July 12, 2010.  A long discussion was had with the patient regarding necessitation for surgical  intervention for removal of the mitral valve mass.  The patient did at that time have complaints of frequent headaches.  As a result, another MRI of the brain might be obtained prior to surgery.  Dr. [name] then contacted Dr. [name] and it was ultimately decided that the patient needed to be admitted to [facility] on July 19, 2010, so that she could undergo IV heparin window as the Coumadin need to be discontinued prior to undergoing a cardiac catheterization as well as ultimately heart surgery.  A cardiac catheterization was then  done by Dr. Percival Spanish on July 19, 2010.  The patient was found to have a 70% stenosis of the second diagonal as well as an occluded distal segment before the PDA.  The patient was then seen in consultation by Dr. Brett Fairy (as the patient had previous history of CVA).  Dr. Brett Fairy then had a discussion with Dr. Leonie Man who had followed her since June 2011) for first CVA event.  It was ultimately decided the patient was surgically stable to undergo CABG x2, mitral valve repair with CorMatrix patch and resection of mitral valve mass on July 21, 2010.  The patient was extubated without difficultly on the evening of surgery.  She remained afebrile and hemodynamically stable. Swan-Ganz, A-line, chest tubes and Foley were all removed earlier in her postoperative course.  She was found to have acute blood loss anemia postoperatively.  Her H and H went as low as 9.4 and 28.4 respectively. She did not require postoperative transfusion.  The patient was felt surgically stable for transfer from the Intensive Care Unit to [facility] for further convalescence on July 22, 2010.  She had been started on low- dose Coumadin and her PT and INR monitored daily.  She was found to be volume overloaded and diuresed accordingly.  She then did experience several episodes of hypoglycemia.  As a result, the patient's Lantus insulin was discontinued.  She did remain on her Glucotrol and metformin as she had taken preoperatively and her glucose stayed well control without any further episodes of hypoglycemia.  The patient had already been started on a low-dose beta-blocker.  In addition, she was also placed on an ACE inhibitor as her blood pressure did allow this.  She continued to progress with cardiac rehab.  Currently on postop day #5, she is afebrile, heart rate in the 70s to 80s, BP 121/78, O2 sat 92-93% on room air.  Preop weight is 100 kg, today's weight is 104.8 kg.  CBG is 142, 88 and 101 respectively.  On  physical examination; cardiovascular, regular rate and rhythm. Pulmonary, slightly decrease at the base.  Abdomen is soft, nontender. Bowel sounds present.  Extremities, trace lower extremity edema. Sternal wound is clean, dry.  There is some slight erythema proximally, however, there is no drainage from the sternal wound.  On tele, the patient was found to have PVCs as well as ventricular bigeminies. Provided she remains afebrile and hemodynamically stable, she will be surgically stable for discharge on July 27, 2010.  Latest laboratory studies are as follows.  PT and INR 13.6 and 1.02 respectively.  BMET done on January 28; potassium 3.9, sodium 139, BUN and creatinine 10 and 0.99 respectively.  CBC done on this date; H and H is 9.6 and 29.3, white count 10,900, platelet count 148,000.  Last chest x-ray done on July 23, 2010, showed no pneumothorax, low lung volumes, atelectasis at the left base, right pleural effusion and atelectasis.  DISCHARGE INSTRUCTIONS: 1. Diet,  the patient to remain on a low-sodium, heart-healthy, diabetic     diet. 2. Activity.  The patient may walk up steps.  She may shower.  She     should not lift more than 10 pounds for 2 weeks, not to drive until     after 2 weeks.  She is to continue with her breathing exercise     daily.  She is to walk up daily and increase frequency duration as     tolerates. 3. Wound care, she is to use soap and water on her sternal wound and     she is to contact the office if any wound problems arise.  FOLLOWUP APPOINTMENTS: 1. The patient has an appointment to see Dr. [name] on August 09, 2010, at 12:45 p.m., 30 minutes prior to this office appointment a     chest x-ray will be obtained. 2. The patient is to contact Dr. Jacalyn Lefevre office for followup     appointment in 2 weeks. 3. The patient needs to call for followup appointment with her medical     doctor, Dr. Cruzita Lederer regarding further diabetes management. 4.  The patient should contact her neurologist to arrange an     appointment at their discretion.  Finally, the patient is going to have to have a PT and INR obtained 48 hours after discharge with results faxed to Dr. [name].  DISCHARGE MEDICATIONS:  At the time of this dictation include the following; 1. Lasix 40 mg p.o. daily x5 days. 2. Potassium chloride 20 mEq p.o. daily x5 days. 3. Mucinex 600 mg p.o. b.i.d. for cough. 4. Lisinopril 10 mg p.o. daily. 5. Enteric-coated aspirin 81 mg p.o. daily. 6. Ultram 50 mg 1-2 tablets every 4-6 hours as needed for pain. 7. Coumadin 7.5 mg p.o. every evening or as directed by Dr. Jacalyn Lefevre     office. 8. Glipizide 10 mg p.o. q.a.m. 9. Metformin 500 mg p.o. q.a.m. 10.Metoclopramide 10 mg p.o. 3 times daily before meals. 11.Metoprolol tartrate 25 mg p.o. 2 times daily. 12.Pravastatin 40 mg p.o. at bedtime.     Lars Pinks, PA   ______________________________ Valentina Gu [name], M.D.    DZ/MEDQ  D:  07/26/2010  T:  07/27/2010  Job:  [id number]  cc:   Valentina Gu. [name], M.D. [name] [name], MD, [facility] [name], M.D. Pramod P. Leonie Man, MD  Electronically Signed by Lars Pinks PA on 07/27/2010 10:46:39 AM Electronically Signed by [name] M.D. on 07/29/2010 07:44:58 AM

## 2010-07-29 NOTE — Miscellaneous (Signed)
Summary: Orders Update  Clinical Lists Changes  Orders: Added new Test order of TLB-PT (Protime) (85610-PTP) - Signed

## 2010-07-29 NOTE — Miscellaneous (Signed)
Summary: Orders Update  Clinical Lists Changes  Orders: Added new Test order of TLB-BMP (Basic Metabolic Panel-BMET) (80048-METABOL) - Signed 

## 2010-07-29 NOTE — H&P (Signed)
[name], [name]              ACCOUNT NO.:  [id number]  MEDICAL RECORD NO.:  [id number]          PATIENT TYPE:  INP  LOCATION:  2009                         FACILITY:  [place]  PHYSICIAN:  [name]. Johnsie Cancel, MD, FACCDATE OF BIRTH:  [birth year]  DATE OF ADMISSION:  07/19/2010 DATE OF DISCHARGE:                             HISTORY & PHYSICAL   PRIMARY CARDIOLOGIST:  [name]. Stacy Breed, MD, [facility]  PRIMARY CARE PROVIDER:  [name], MD  PATIENT PROFILE:  A 60 year old female with history of mitral valve mass and prior CVA and TIAs who presents for admission for cardiac catheterization followed by heparin bridging followed by mitral valvular surgery.  PROBLEM LIST: 1. A 7 x 9-mm mitral valve mass by TEE in December 2011. 2. History of cerebrovascular accident in June 2011. 3. Cerebrovascular disease including right MCA stenosis. 4. Hypertension. 5. Hyperlipidemia. 6. Type 2 diabetes mellitus. 7. Obstructive sleep apnea. 8. Iron-deficiency anemia. 9. History of lumbar abscess in October 2007. 10.Status post tonsillectomy. 11.Status post hysterectomy. 12.Status post appendectomy. 13.Status post right ankle surgery secondary to trauma.  ALLERGIES:  No known drug allergies.  HISTORY OF PRESENT ILLNESS:  A 60 year old female with prior history of CVA in June 2011 who at that time underwent transesophageal echocardiogram showing a 7 x 9 mm posterior leaflet mitral valve mass. He was subsequently placed on Coumadin as stroke was felt to be embolic. It should be noted that MRA at that time also showed right MCA stenosis. The patient was re-admitted in December for possible TIA symptoms as she had not been taking her Coumadin.  She was subsequently set up for an outpatient transesophageal echocardiogram in late December again revealing mitral valve mass affecting the posterior leaflet.  She was seen by Dr. Stanford Greene in the office on June 29, 2010, and was subsequently  referred to see Dr. Darylene Greene whom she saw on July 12, 2010.  She presents today for elective admission and cardiac catheterization with tentative plan for surgery on July 21, 2010. Coumadin has been on hold since July 16, 2010.  The patient reports chronic dyspnea on exertion after walking about half to one block.  She denies any chest pain.  She notes an occasional wheeze, but unfortunately still smoking intermittently.  HOME MEDICATIONS: 1. Glucotrol 10 mg daily. 2. Metformin 500 mg daily. 3. Lisinopril/hydrochlorothiazide 10/12.5 mg daily. 4. Aspirin 325 mg daily. 5. Pravastatin 40 mg at bedtime. 6. Metoprolol tartrate 25 mg b.i.d. 7. Coumadin 5 mg as directed on hold since July 16, 2010. 8. Reglan 10 mg b.i.d. 9. Tramadol 50 mg p.r.n.  FAMILY HISTORY:  Father died at 43 with cancer.  Mother died at 60 with colon cancer.  She has an older brother with a history of CVA and another older brother with hypertension and younger brother with history of coronary artery disease at 63.  SOCIAL HISTORY:  The patient lives in [place] with her husband.  She has been smoking a better part of 25 or so years.  She smoked 2-1/2 packs a day for 20 years, then quit for 10 years, then resumed about half pack a day for the  past 5 years.  Since November, she has been smoking about a pack of cigarettes every month or two.  She denies alcohol or drug use.  She is not routinely exercising and she is currently out of work since her stroke.  REVIEW OF SYSTEMS:  She says she stays cold ever since being placed on Coumadin.  She has chronic constipation.  She denies any dark or bloody stools.  She has dyspnea as outlined above.  She is a full code. Otherwise all systems reviewed and negative.  PHYSICAL EXAMINATION:  VITAL SIGNS:  She is afebrile, heart rate 70, respirations 16, blood pressure 120/72, pulse ox 98% on room air. GENERAL:  Pleasant African American female in no acute  distress.  Awake, alert and oriented x3.  She has a normal affect. HEENT:  Normal.  Nares grossly intact, nonfocal. SKIN:  Warm and dry without lesions or masses. NECK:  Supple without bruits or JVD. LUNGS:  Respirations are regular and unlabored.  Clear to auscultation. CARDIAC:  Regular S1 and S2.  No S3, S4, or murmurs. ABDOMEN:  Round, soft, nontender, and nondistended.  Bowel sounds present x4. EXTREMITIES:  Warm, dry, and pink.  No clubbing, cyanosis, or edema. Dorsalis pedis and posterior tibial pulses 2+ and equal bilaterally.  ACCESSORY CLINICAL FINDINGS:  All lab work, chest x-ray, and EKG is pending.  ASSESSMENT/PLAN: 1. Mitral valve mass.  The patient has been seen by Cardiothoracic     Surgery in the outpatient setting.  He is being admitted today for     cardiac catheterization and INR is acceptable.  We will check stat     labs now and hopefully proceed with catheterization.  During     catheterization, the patient will require left groin access and     also distal aortogram for possible minimally invasive mitral valve     surgery.  She will require heparin bridging post catheterization     and again surgery is tentatively scheduled for July 21, 2010,     with Dr. Darylene Greene.  It should be noted that the patient has     already been seen by dentistry in the outpatient setting. 2. History of cerebrovascular accident/transient ischemic attacks.     Plan to bridge with heparin post catheterization.  Surgery as     above.  The patient has known cerebrovascular disease and     significant right middle cerebral artery stenosis. 3. Hypertension stable. 4. Hyperlipidemia.  Continue statin therapy. 5. Diabetes mellitus.  We are holding metformin.  Continue Glucotrol,     ACE inhibitor, statin, and aspirin.  We will write for sliding     scale insulin.     Murray Hodgkins, ANP   ______________________________ [name]. Johnsie Cancel, MD, [facility]    CB/MEDQ  D:   07/19/2010  T:  07/20/2010  Job:  [id number]  Electronically Signed by Murray Hodgkins ANP on 07/26/2010 12:42:01 PM Electronically Signed by Jenkins Rouge MD [facility] on 07/29/2010 10:39:35 PM

## 2010-07-29 NOTE — Assessment & Plan Note (Signed)
Summary: np6. cardiac eval - [facility]...   Primary Provider:  [name] MD  CC:  fatigue.  History of Present Illness: 60 year old female evaluation of mitral valve mass and previous TIAs. Patient had a CVA in June of 2011. 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 She was treated with Coumadin. She was readmitted in December with possible TIA symptoms; she had not been taking coumadin. An echocardiogram revealed the posterior mass and she was discharged. She had an outpatient TEE last week which again revealed the mass. Because of the above we were asked to further evaluate. Blood cultures have been negative. Note previous MRA revealed right MCA stenosis as well. She does have some dyspnea with more extreme activities but not with routine activities. There is no orthopnea, PND, syncope or chest pain. She occasionally feels brief palpitations and has minimal pedal edema. She's had no recurrent neurological symptoms in early December.  Current Medications (verified): 1)  Glucotrol 10 Mg Tabs (Glipizide) .... Take 1 Tablet By Mouth Once A Day 2)  Metformin Hcl 500 Mg Tabs (Metformin Hcl) .... Take 1 Tablet By Mouth Once A Day 3)  Lisinopril-Hydrochlorothiazide 10-12.5 Mg Tabs (Lisinopril-Hydrochlorothiazide) .[name].. 1 Tab By Mouth Once Daily 4)  Aspirin Ec 325 Mg Tbec (Aspirin) .... Take One Tablet By Mouth Daily 5)  Pravastatin Sodium 40 Mg Tabs (Pravastatin Sodium) .... Take One Tablet By Mouth Daily At Bedtime 6)  Metoprolol Tartrate 25 Mg Tabs (Metoprolol Tartrate) .... Take One Tablet By Mouth Twice A Day 7)  Warfarin Sodium 5 Mg Tabs (Warfarin Sodium) .... As Directed 8)  Metoclopramide Hcl 10 Mg Tabs (Metoclopramide Hcl) .[name].. 1 Tab By Mouth Two Times A Day 9)  Tramadol Hcl 50 Mg Tabs (Tramadol Hcl) .... As Needed  Allergies: No Known Drug Allergies  Past History:  Past Medical History: Diabetes  mellitus, type II Hypertension Hyperlipidemia Hx of lumber abscess 03/2006 Anemia-iron deficiency OSA CVA  Past Surgical History: Back surgery for abscess Tonsillectomy Hysterectomy Appendectomy Right ankle surgery from previous accident  Family History: Brother with CAD  Social History: Lives alone in [place]. Former tobacco abuse (Quit 11/11). No ETOH, No IVDU. Married.  Vital Signs:  Patient profile:   60 year old female Height:      67 inches Weight:      227 pounds BMI:     35.68 Pulse rate:   70 / minute Resp:     14 per minute BP sitting:   124 / 70  (left arm)  Vitals Entered By: Burnett Kanaris (June 29, 2010 11:37 AM)  Physical Exam  General:  Well developed/well nourished in NAD Skin warm/dry Patient not depressed No peripheral clubbing Back-normal HEENT-normal/normal eyelids Neck supple/normal carotid upstroke bilaterally; no bruits; no JVD; no thyromegaly chest - CTA/ normal expansion CV - RRR/normal S1 and S2; no murmurs, rubs or gallops;  PMI nondisplaced Abdomen -NT/ND, no HSM, no mass, + bowel sounds, no bruit 2+ femoral pulses, no bruits Ext-no edema, chords, 2+ DP Neuro-grossly nonfocal     Impression & Recommendations:  Problem # 1:  TIA (ICD-435.9) Patient has had recurrent neurological events and is also noted to have a mitral valve mass. 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 Blood cultures have been negative. Regardless I think it could be the source of her recurrent events. I think it will need to be resected. I have discussed the patient with Dr. [name]. He will see  the patient in clinic and wants the surgery has been scheduled we will plan cardiac catheterization the day prior to the procedure. She will continue the Coumadin for now. Note the risks and benefits of cardiac catheterization including but not limited to myocardial infarction, CVA and death were discussed and the patient agrees to proceed.  Problem # 2:  HYPERTENSION,  ESSENTIAL NOS (ICD-401.9) Blood pressure controlled on present medications. Will continue. The following medications were removed from the medication list:    Lisinopril 20 Mg Tabs (Lisinopril) .[name]... Take 1 tablet by mouth once a day    Hydrochlorothiazide 25 Mg Tabs (Hydrochlorothiazide) .[name]... Take 1 tablet by mouth once a day Her updated medication list for this problem includes:    Lisinopril-hydrochlorothiazide 10-12.5 Mg Tabs (Lisinopril-hydrochlorothiazide) .[name]... 1 tab by mouth once daily    Aspirin Ec 325 Mg Tbec (Aspirin) .[name]... Take one tablet by mouth daily    Metoprolol Tartrate 25 Mg Tabs (Metoprolol tartrate) .[name]... Take one tablet by mouth twice a day  Problem # 3:  HYPERLIPIDEMIA (ICD-272.4) Continue statin. Lipids and liver monitored by primary care. Her updated medication list for this problem includes:    Pravastatin Sodium 40 Mg Tabs (Pravastatin sodium) .[name]... Take one tablet by mouth daily at bedtime  Problem # 4:  DIABETES MELLITUS, TYPE II (ICD-250.00)  The following medications were removed from the medication list:    Lisinopril 20 Mg Tabs (Lisinopril) .[name]... Take 1 tablet by mouth once a day Her updated medication list for this problem includes:    Glucotrol 10 Mg Tabs (Glipizide) .[name]... Take 1 tablet by mouth once a day    Metformin Hcl 500 Mg Tabs (Metformin hcl) .[name]... Take 1 tablet by mouth once a day    Lisinopril-hydrochlorothiazide 10-12.5 Mg Tabs (Lisinopril-hydrochlorothiazide) .[name]... 1 tab by mouth once daily    Aspirin Ec 325 Mg Tbec (Aspirin) .[name]... Take one tablet by mouth daily  Other Orders: TLB-PT (Protime) (85610-PTP)  Patient Instructions: 1)  Your physician recommends that you schedule a follow-up appointment in: 3 MONTHS  Prevention & Chronic Care Immunizations   Influenza vaccine: Not documented    Tetanus booster: Not documented    Pneumococcal vaccine: Not documented    H. zoster vaccine: Not documented  Colorectal Screening    Hemoccult: Not documented    Colonoscopy: Not documented  Other Screening   Pap smear: Not documented    Mammogram: Not documented    DXA bone density scan: Not documented   Smoking status: quit  (09/29/2006)  Diabetes Mellitus   HgbA1C: 8.1  (02/08/2010)    Eye exam: Not documented    Foot exam: Not documented   High risk foot: Not documented   Foot care education: Not documented    Urine microalbumin/creatinine ratio: Not documented  Lipids   Total Cholesterol: 237  (05/28/2010)   LDL: 137  (05/28/2010)   LDL Direct: Not documented   HDL: 82  (05/28/2010)   Triglycerides: 88  (05/28/2010)    SGOT (AST): 17  (05/28/2010)   SGPT (ALT): 16  (05/28/2010)   Alkaline phosphatase: 48  (05/28/2010)   Total bilirubin: 0.4  (05/28/2010)  Hypertension   Last Blood Pressure: 124 / 70  (06/29/2010)   Serum creatinine: 0.94  (05/28/2010)   Serum potassium 4.7  (05/28/2010)  Self-Management Support :    Diabetes self-management support: Not documented    Hypertension self-management support: Not documented    Lipid self-management support: Not documented     Appended Document: np6.  cardiac eval - [facility]... I discussed the patient with Dr. [name] today. He plans to proceed with resection of her mitral valve mass on January 25. We will discontinue her Coumadin on January 20. She will be admitted to [facility] on January 23 and proceed with cardiac catheterization if her INR is less than 1.6. Following her cardiac catheterization she should be placed on IV heparin until her surgery occurs. She has had 2 previous neurological events. Dr. [name] also plans further evaluation of headaches including repeat MRI prior to her surgery. He has also requested aortogram at the time of her catheterization.

## 2010-07-29 NOTE — Op Note (Signed)
[name], [name]              ACCOUNT NO.:  [id number]  MEDICAL RECORD NO.:  [id number]           PATIENT TYPE:  LOCATION:                                 FACILITY:  PHYSICIAN:  Valentina Gu. [name], M.D. DATE OF BIRTH:  [birth year]  DATE OF PROCEDURE:  07/21/2010 DATE OF DISCHARGE:                              OPERATIVE REPORT   PREOPERATIVE DIAGNOSES: 1. Mitral valve mass. 2. Coronary artery disease.  POSTOPERATIVE DIAGNOSES: 1. Mitral valve mass. 2. Coronary artery disease.  PROCEDURES:  Median sternotomy for resection of mitral valve mass, mitral valve repair, and coronary artery bypass grafting x2 (saphenous vein graft to first diagonal branch, saphenous vein graft to posterior descending coronary artery with coronary endarterectomy, endoscopic saphenous vein harvest from right thigh).  SURGEON:  Valentina Gu. [name], MD  ASSISTANT:  Lars Pinks, PA  ANESTHESIOLOGIST:  Finis Bud, MD  BRIEF CLINICAL NOTE:  The patient is a 60 year old obese female with history of hypertension, type 2 diabetes mellitus, hyperlipidemia, and cerebrovascular disease.  The patient suffered a stroke in June 2011. She was found to have high-grade atherosclerotic stenosis of the right middle cerebral artery.  The patient also had transesophageal echocardiogram performed at that time demonstrating a small mass on the atrial surface of the mitral valve.  She was treated with Coumadin.  She was noncompliant with Coumadin and suffered TIAs in December prompting repeat evaluation.  Repeat MRI and MRA confirmed no new strokes and no significant change in the high-grade stenosis of the right middle cerebral artery.  Repeat transesophageal echocardiogram confirmed the presence of a small mass on the atrial surface of the mitral valve suggestive of papillary fibroelastoma.  The patient was admitted to the hospital and underwent cardiac catheterization demonstrating two-vessel coronary  artery disease with normal left ventricular function.  A full consultation note has been dictated previously.  The patient has also been seen in consultation by the Neurology Team and it is felt that the patient should proceed with surgical intervention for definitive resection of the mitral valve mass in question.  The patient and her family have been counseled at length regarding the indications, risks, and potential benefits of surgery.  Alternative treatment strategies have been discussed.  The patient and her family understand that she may be at somewhat elevated risk for perioperative stroke.  The patient also understands that even in the absence of a new stroke, symptoms and signs related to her previous stroke could transiently become worse during the recovery from her surgery.  They understand and accept all potential associated risks and desire to proceed with surgery as described.  OPERATIVE FINDINGS: 1. Small mass on the atrial surface of the mitral valve which appeared     consistent with papillary fibroelastoma on frozen section     histology. 2. Trace-to-mild mitral regurgitation. 3. Moderate left ventricular hypertrophy. 4. Severe atherosclerotic disease involving the chronically occluded     distal right coronary artery with poor target vessel for grafting. 5. Good-quality saphenous vein conduit for grafting.  OPERATIVE PROCEDURE IN DETAIL:  The patient was brought to the operating room on the above-mentioned date and placed  in the supine position on the operating table.  Central monitoring was established by the anesthesia team under the care and direction of Dr. Finis Bud. Specifically, a Swan-Ganz catheter was placed through the right internal jugular approach.  A radial arterial line was placed.  Intravenous antibiotics were administered.  Following induction with general endotracheal anesthesia, Foley catheter was placed.  The patient's chest, abdomen,  both groins, and both lower extremities were prepared and draped in sterile manner.  Baseline transesophageal echocardiogram was performed by Dr. Oletta Lamas. This confirms the presence of a small mass emanating from the atrial surface of the posterior leaflet of the mitral valve.  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  There is trace to mild mitral regurgitation.  There is normal left ventricular systolic function.  No other significant abnormalities are noted.  Greater saphenous vein is removed from the patient's right thigh using endoscopic vein harvest technique through a small incision made just below the right knee.  The saphenous vein is good-quality conduit. After the saphenous vein has been removed, the small incision in the right leg was closed with absorbable suture.  A median sternotomy incision was performed.  The pericardium was opened. The ascending aorta is normal in appearance.  The patient is heparinized systemically.  The ascending aorta was cannulated for cardiopulmonary bypass.  A small stab incision was made in the right groin.  The right common femoral vein was cannulated with a Seldinger technique and a long flexible guidewire was advanced up through the inferior vena cava through the right atrium into the superior vena cava using transesophageal echocardiogram for guidance.  The femoral vein was dilated with serial dilators, and a 22-French long femoral venous cannula was advanced over the guidewire up through the right atrium until the tip of the cannula extends into the superior vena cava.  A retrograde cardioplegic cannula was placed through the right atrium into the coronary sinus.  Cardiopulmonary bypass was begun.  A second venous cannula was placed directly in the superior vena cava.  An antegrade cardioplegic cannula was placed directly in the ascending aorta.  Distal target vessels were selected for coronary bypass grafting.   There is moderate left ventricular hypertrophy.  The distal right coronary artery and the posterior descending coronary artery are diffusely diseased. The distal right coronary artery is chronically occluded.  Aortic crossclamp was applied, and cold blood cardioplegia was delivered in the antegrade fashion through the aortic root.  Iced saline slush was applied for topical hypothermia.  Supplemental cardioplegia was administered retrograde through the coronary sinus catheter.  The initial cardioplegic arrest was rapid with early diastolic arrest.  The patient is cooled systemically to 32 degrees systemic temperature. Repeat doses of cardioplegia are administered intermittently throughout the entire crossclamp portion of the operation through the aortic root, down subsequently placed vein grafts, and retrograde through the coronary sinus catheter to maintain completely flat electrocardiogram, and left ventricular septal myocardial temperature below 15 degrees centigrade.  The following distal coronary anastomoses are performed: 1. The diagonal branch of the left anterior descending coronary artery     is grafted with a saphenous vein graft in end-to-side fashion.     This vessel measured 1.9 mm in diameter and is a good-quality     target vessel at the site of distal grafting. 2. The posterior descending coronary artery is grafted with a     saphenous vein graft in end-to-side fashion after performing     coronary endarterectomy.  No good  site to open the vessel could be     located and after opening the posterior descending coronary artery     approximately 6 or 8 mm beyond the bifurcation, it is clear that     coronary endarterectomy will be necessary in order to successfully     graft the vessel.  The telescoping technique to remove plaque is     performed and a long plaque from the distal right coronary artery     and the posterior descending coronary arteries is easily removed      without disruption of the plaque.  The distal graft was constructed     without difficulty.  The left atriotomy incision was performed posteriorly through the interatrial groove and continued partway across the back wall of the left atrium after opening the oblique sinus inferiorly.  The left atrium was quite small eliminating exposure.  However, after placement of a self-retaining retractor, there was an obvious fungating mass emanating from the atrial surface of the posterior mitral annulus in the midportion of the posterior leaflet of the valve.  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 There is no associated thrombus.  There is nothing to suggest vegetation or other infection.  The mass is adherent to the posterior leaflet at the level of the posterior annulus via a very small stalk.  The mass and the associated stalk and associated endocardial surface of the posterior leaflet were removed in one piece and sent to pathology.  Preliminary frozen section histology demonstrates findings consistent with papillary fibroelastoma.  It is requested to the pathology office to submit specimen for routine culture.  The small hole in the posterior leaflet was closed with a patch of CorMatrix, bovine submucosal tissue patch.  This small patch was sewn in place with running CV5 Gore-Tex suture.  Ring annuloplasty was not performed as the patient had trivial to mild mitral regurgitation prior to surgery, and the patch did not affect function of the posterior leaflets of the valve at all.  Upon saline testing, the patient's valve remains competent.  Rewarming was begun.  The left atriotomy incision was closed using a two- layer closure of running 3-0 Prolene suture.  Both proximal saphenous vein anastomoses were performed directly to the ascending aorta prior to removal of the aortic crossclamp.  The lungs were ventilated and heart allowed to fill  after which time one final dose of warm retrograde hot shot cardioplegia was administered.  The aortic crossclamp was removed after a total crossclamp time of 84 minutes.  The heart began to beat spontaneously without need for cardioversion. The retrograde cardioplegic cannula was removed.  Epicardial pacing wires were fixed to the right ventricular free wall and to the right atrial appendage.  The superior vena cava cannula was removed.  The patient is rewarmed to 37 degrees centigrade temperature.  The patient is weaned from cardiopulmonary bypass without difficulty.  The patient's rhythm at separation from bypass is a slow junctional escape rhythm.  AV sequential pacing was employed.  No inotropic support is required. Followup transesophageal echocardiogram performed by Dr. Oletta Lamas after separation from bypass demonstrates normal functioning mitral valve with mild mitral regurgitation.  There is normal left ventricular function. No other abnormalities are noted.  Protamine was administered to reverse anticoagulation.  The aortic cannula was removed uneventfully.  The femoral venous cannula was removed, and manual pressure was held on the groin for 30 minutes.  The mediastinum was irrigated with saline solution.  Meticulous surgical hemostasis was ascertained.  Mediastinum was drained with two chest tubes placed through separate stab incisions inferiorly.  The pericardium and soft tissues anterior to the aorta are reapproximated loosely and a patch of CorMatrix was utilized to re-close the pericardium on the anterior surface of the right ventricular outflow tract.  The sternum was closed with double-strength sternal wire.  The soft tissues anterior to the sternum are closed in multiple layers and the skin was closed using running subcuticular skin closure.  The patient tolerated the procedure well and was transported to surgical intensive care unit in stable condition.  There are no  intraoperative complications.  All sponge, instrument, and needle counts were verified correct at completion of the operation.  No blood products were administered.     Valentina Gu. [name], M.D.     CHO/MEDQ  D:  07/21/2010  T:  07/22/2010  Job:  [id number]  cc:   [name]. [name], MD, [facility] Minus Breeding, MD, [place] [facility]. Leonie Man, MD [name], M.D.  Electronically Signed by [name] M.D. on 07/29/2010 07:44:50 AM

## 2010-07-30 ENCOUNTER — Encounter: Payer: Self-pay | Admitting: Cardiology

## 2010-07-30 LAB — CONVERTED CEMR LAB
POC INR: 1.6
Prothrombin Time: 18.8 s

## 2010-08-03 ENCOUNTER — Encounter: Payer: Self-pay | Admitting: Cardiovascular Disease

## 2010-08-03 LAB — CONVERTED CEMR LAB
POC INR: 1.9
Prothrombin Time: 22.8 s

## 2010-08-04 ENCOUNTER — Encounter: Payer: Self-pay | Admitting: Cardiovascular Disease

## 2010-08-04 NOTE — Medication Information (Signed)
Summary: Coumadin Clinic  Anticoagulant Therapy  Managed by: Porfirio Oar, PharmD Referring MD: [name] PCP: [name] MD Supervising MD: [name] MD, Aaron Edelman Indication 1: Atrial Fibrillation Indication 2: CVA Lab Used: [facility] [place] Site: [place] PT 18.8 INR POC 1.6 INR RANGE 2.0-3.0  Dietary changes: no    Health status changes: no    Bleeding/hemorrhagic complications: no    Recent/future hospitalizations: yes       Details: recently discharged after mitral valve repair, CABGx 2 and coronary endarterectomy  Any changes in medication regimen? no    Recent/future dental: no  Any missed doses?: no       Is patient compliant with meds? yes      Comments: Pt took 10mg  last night.  [facility]   Allergies: No Known Drug Allergies  Anticoagulation Management History:      The patient is taking warfarin and comes in today for a routine follow up visit.  Positive risk factors for bleeding include history of CVA/TIA and presence of serious comorbidities.  Negative risk factors for bleeding include an age less than 12 years old.  The bleeding index is 'intermediate risk'.  Positive CHADS2 values include History of HTN, History of Diabetes, and Prior Stroke/CVA/TIA.  Negative CHADS2 values include Age > 60 years old.  Her last INR was 1.1 ratio.  Prothrombin time is 18.8.  Anticoagulation responsible provider: [name] MD, Aaron Edelman.  INR POC: 1.6.    Anticoagulation Management Assessment/Plan:      The patient's current anticoagulation dose is Warfarin sodium 5 mg tabs: as directed.  The next INR is due 08/02/2010.  Anticoagulation instructions were given to patient.  Results were reviewed/authorized by Porfirio Oar, PharmD.  She was notified by Porfirio Oar PharmD.         Current Anticoagulation Instructions: INR 1.6  Spoke with Al while in pt's home.  Continue 7.5mg  daily.  Recheck INR on Monday.

## 2010-08-06 ENCOUNTER — Encounter (INDEPENDENT_AMBULATORY_CARE_PROVIDER_SITE_OTHER): Payer: Medicaid Other | Admitting: Thoracic Surgery (Cardiothoracic Vascular Surgery)

## 2010-08-06 ENCOUNTER — Ambulatory Visit
Admission: RE | Admit: 2010-08-06 | Discharge: 2010-08-06 | Disposition: A | Discharge status: Home / Self Care | Payer: Medicaid Other | Source: Ambulatory Visit | Attending: Thoracic Surgery (Cardiothoracic Vascular Surgery) | Admitting: Thoracic Surgery (Cardiothoracic Vascular Surgery)

## 2010-08-06 ENCOUNTER — Encounter: Payer: Self-pay | Admitting: Cardiology

## 2010-08-06 ENCOUNTER — Other Ambulatory Visit: Payer: Self-pay | Admitting: Thoracic Surgery (Cardiothoracic Vascular Surgery)

## 2010-08-06 DIAGNOSIS — I2510 Atherosclerotic heart disease of native coronary artery without angina pectoris: Secondary | ICD-10-CM

## 2010-08-06 DIAGNOSIS — I059 Rheumatic mitral valve disease, unspecified: Secondary | ICD-10-CM

## 2010-08-06 NOTE — Assessment & Plan Note (Signed)
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  Her postoperative recovery has been entirely uncomplicated.  Since hospital discharge, she has continued to improve very nicely.  She has had a prothrombin time checked on one occasion.  Her INR reportedly was 1.9 and her dose of Coumadin was increased.  She has not yet seen Dr. [name] for routine followup in the office.  Medications Remain unchanged from the time of hospital discharge.  [name] returns for routine followup and reports that overall she is getting along quite nicely.  She still has mild soreness in her chest.  She uses pain medication primarily to help her sleep at night.  She states that sometimes she will take a pain pill in the morning if she feels tight when she first gets up.  Otherwise, she is getting along very nicely. She has not had any shortness of breath.  She is ambulating as well as she did prior to surgery.  She still feels somewhat weak and this seems to be improving.  Her appetite is good.  She has no other complaints. She has not had any tachy palpitations or dizzy spells.  PHYSICAL EXAMINATION:  GENERAL:  Notable for well-appearing female. VITAL SIGNS:  Blood pressure 117/76, pulse 96 and regular, and oxygen saturation 98% on room air. CHEST:  Notable for median sternotomy incision that is healing nicely. The sternum is stable on palpation.  Breath sounds are clear to auscultation and symmetrical bilaterally.  No wheezes, rales, or rhonchi are noted. CARDIOVASCULAR:  Regular rate and rhythm.  No murmurs, rubs, or gallops are appreciated. ABDOMEN:  Soft and  nontender. EXTREMITIES:  Warm and well perfused.  There is no lower extremity edema.  The small incision from endoscopic vein harvest is healing nicely.  There is no lower extremity edema.  The remainder of her physical exam is unremarkable.  DIAGNOSTIC TESTS:  Chest x-ray performed today at the [facility] is reviewed.  This reveals clear lung fields bilaterally.  There is trivial right pleural effusion.  All the sternal wires appear intact. No other abnormalities are noted.  IMPRESSION:  Excellent progress following recent median sternotomy for resection of mitral valve papillary fibroelastoma, mitral valve repair, and coronary artery bypass grafting x2.  [name] is doing very well.  PLAN:  I have encouraged [name] to continue to increase her physical activity as tolerated with a primary limitation at this point remaining that she refrain from heavy lifting or strenuous use of her arms or shoulders.  I have encouraged her to enroll in outpatient cardiac rehab program to help with her strengthening and endurance.  All of her questions have been addressed.  We have not made any changes to her current medications.  We will plan to see [name] back for routine followup in 3 months.  Valentina Gu. [name], M.D. Electronically Signed  CHO/MEDQ  D:  08/06/2010  T:  08/06/2010  Job:  [id number]  cc:   [name]. [name], MD, [place]. Leonie Man, MD [name],  M.D. Minus Breeding, MD, [facility]

## 2010-08-09 ENCOUNTER — Encounter: Payer: Self-pay | Admitting: Thoracic Surgery (Cardiothoracic Vascular Surgery)

## 2010-08-09 DIAGNOSIS — I25810 Atherosclerosis of coronary artery bypass graft(s) without angina pectoris: Secondary | ICD-10-CM | POA: Insufficient documentation

## 2010-08-11 ENCOUNTER — Encounter: Payer: Self-pay | Admitting: Internal Medicine

## 2010-08-11 ENCOUNTER — Telehealth: Payer: Self-pay | Admitting: Cardiovascular Disease

## 2010-08-11 NOTE — Consult Note (Signed)
[name], [name]              ACCOUNT NO.:  [id number]  MEDICAL RECORD NO.:  [id number]          PATIENT TYPE:  INP  LOCATION:  2009                         FACILITY:  [facility]  PHYSICIAN:  Larey Seat, M.D.  DATE OF BIRTH:  1951/04/09  DATE OF CONSULTATION: DATE OF DISCHARGE:                                CONSULTATION   A 60 year old African American right-handed female.  Ms. [name] is also a patient of Dr. [name] and Dr. Antony Contras.  CHIEF COMPLAINT:  Fatigue.  This 60 year old female patient has been admitted to the cardiology service in preparation for possible invasive surgery.  She had a stroke in June 2011 and has since been followed by Dr. Leonie Man.  The stroke was of embolic origin and had left as well as scattered right brain ischemic strokes.  A high-grade stenosis of the right MCA was later found and was considered that these were vasogenic strokes or could have been watershed infarcts if they occurred in combination with low blood pressure but then in the workup completion mitral valve mass was found as well as mitral regurgitation.  The patient was placed on Coumadin. She was noncompliant with the Coumadin and presented in December again with left body symptoms, hand clumsiness, dysarthria, arm numbness, finger numbness on the left hand.  An MRI was repeated but was negative for new stroke.  A TEE had been ordered in December 2011 and again a mass was documented, thought to be a benign mitral valve tumor.  The mitral regurgitation has not progressed, but the patient had clearly suffered TIAs.  The past medical and surgical history includes diabetes mellitus, hypertension, pain, and nausea.  The patient has now been admitted and is awaiting cardiac surgery.  Dr. Clydene Fake opinion about how to approach this surgery was requested.  The patient is currently on Glucotrol, metformin, lisinopril, hydrochlorothiazide, aspirin,  metoprolol, metoclopramide, and tramadol.  She has a sedentary lifestyle.  She has diabetes mellitus, hyperlipidemia.  She is obese and she has hypertension, stroke risk factors as well as the embolic risk factor of the mitral valve mass.  She has no known drug allergies, family history was not obtained.  SOCIAL HISTORY:  The patient quit smoking last month.  She has been disabled since the stroke 2011 and until then worked at the [employer] here in town.  Tobacco use yes.  EtOH no.  Illicit drug use no.  The patient presents with a systolic blood pressure today of 99991111 and a diastolic of 80.  She has a room air saturation of 98%, her temperature is 97.4 degrees Fahrenheit, pulse rate is 69 beats per minute, respiration is 18 per minute.  Her I&O's have not been documented.  CURRENT MEDICATIONS:  Aspirin full dose once a day, diazepam 5 mg once a day, fentanyl patch 100 mcg every 3 days, glipizide 10 mg every day, heparin currently on IV 25,000 units by drip, hydrochlorothiazide in the morning 12.5 mg, insulin NovoLog 15 units t.i.d., lidocaine 30 mg was only for procedure one time, lisinopril cardiovascular medication 10 mg daily, metoprolol 25 mg b.i.d., p.r.n. Versed use.  LAB RESULTS:  Today activated clotting time 99 seconds.  CMP; normal sodium, potassium chloride, CO2, glucose of 138.  The patient clearly has diabetes, BUN is 15, creatinine was 0.98, bilirubin was 0.7, SGOT 20, GPT 17, calcium 9.2, PTT 25, PT 12.2, INR 0.89.  CBC with differential shows a white blood cell count of 6.1 and H&H of 11.2/34.7. The patient's platelet count 256,000.  In December 2011, the patient had a positive cocaine test in this hospital updated June 09, 2010, so last month.  She had a negative versus screening and an HbA1c in December of 7.2, mean plasma glucose of 160.  Lipid profile of 187. Total cholesterol; LDL 102, HDL 60, triglyceride 127.  Besides positive for cocaine, there was no  other drugs of abuse found.  The patient had a left-sided pronator drift.  She feels numb in the left arm and hand.  Her left leg is weaker more than the upper extremity and she does limp with her gait.  She also has dysarthria rather than aphasia and feels paroxysmally worsening of the clumsiness and slurred speech.  She has used a cane or walking stick for stabilization.  She is negative to a straight leg raising pain.  She is negative for Lhermitte's sign.  She is negative for any vision impairment, ptosis, or for abnormal extraocular movements.  She did not provide dorsiflexion and plantar flexion with the right ankle and had an upgoing toe on the left side.  CONCLUSION:  The patient has just undergone a catheterization in preparation for surgery which showed no significant cardiovascular disease.  I would agree with the patient to be on heparin until surgery and to resume Coumadin shortly thereafter of a incomplete resection would be found.  I would consider and have to discuss this with Dr. Leonie Man that he tomorrow morning that she may want to treat the MCA stenosis first and then follow with the mitral valve mass resection. This would allow the reduction of the watershed risk during surgery given that her blood pressures will be low.  Again after surgery, she probably will need to be on Plavix or aspirin unless the resection would be incomplete and Coumadin would need to continue in that case.  I think that her neurological symptoms are likely related to the stenosis since all her documented strokes occurred in the right hemisphere and all symptoms are left-sided.  Again, I will ask Dr. Leonie Man to comment on this since I have seen the patient for the very first time and he has followed her since June 2011.  I appreciate Dr. Jacalyn Lefevre consultation. I will relate to Dr. Leonie Man that the patient will be seen early in the morning of July 20, 2010.     Larey Seat,  M.D.     CD/MEDQ  D:  07/19/2010  T:  07/20/2010  Job:  [id number]  Electronically Signed by Larey Seat M.D. on 08/11/2010 09:36:16 AM

## 2010-08-12 NOTE — Consult Note (Signed)
Summary: [facility]   Imported By: Sallee Provencal 08/05/2010 09:37:19  _____________________________________________________________________  External Attachment:    Type:   Image     Comment:   External Document

## 2010-08-12 NOTE — Medication Information (Addendum)
Summary: Coumadin Clinic  Anticoagulant Therapy  Managed by: Porfirio Oar, PharmD Referring MD: [name] PCP: [name] MD Supervising MD: Johnsie Cancel MD, [name] Indication 1: Atrial Fibrillation Indication 2: CVA Lab Used: [place] Site: [place] [facility] 22.8 INR POC 1.9 INR RANGE 2.0-3.0  Dietary changes: no    Health status changes: no    Bleeding/hemorrhagic complications: no    Recent/future hospitalizations: no    Any changes in medication regimen? no    Recent/future dental: no  Any missed doses?: no       Is patient compliant with meds? yes       Allergies: No Known Drug Allergies  Anticoagulation Management History:      The patient is taking warfarin and comes in today for a routine follow up visit.  Positive risk factors for bleeding include history of CVA/TIA and presence of serious comorbidities.  Negative risk factors for bleeding include an age less than 43 years old.  The bleeding index is 'intermediate risk'.  Positive CHADS2 values include History of HTN, History of Diabetes, and Prior Stroke/CVA/TIA.  Negative CHADS2 values include Age > 48 years old.  Her last INR was 1.1 ratio.  Prothrombin time is 22.8.  Anticoagulation responsible provider: Johnsie Cancel MD, [name].  INR POC: 1.9.    Anticoagulation Management Assessment/Plan:      The patient's current anticoagulation dose is Warfarin sodium 5 mg tabs: as directed.  The next INR is due 08/10/2010.  Anticoagulation instructions were given to patient.  Results were reviewed/authorized by Porfirio Oar, PharmD.  She was notified by Porfirio Oar PharmD.         Prior Anticoagulation Instructions: INR 1.6  Spoke with Al while in pt's home.  Continue 7.5mg  daily.  Recheck INR on Monday.   Current Anticoagulation Instructions: INR 1.9  Spoke with Santiago Glad with [facility] while in pt's home.  Take 10mg  x 1 then resume 7.5mg  daily.  Recheck INR in 1 week.

## 2010-08-13 ENCOUNTER — Encounter: Payer: Self-pay | Admitting: Physician Assistant

## 2010-08-13 ENCOUNTER — Ambulatory Visit (INDEPENDENT_AMBULATORY_CARE_PROVIDER_SITE_OTHER): Payer: Medicaid Other | Admitting: Physician Assistant

## 2010-08-13 DIAGNOSIS — I2510 Atherosclerotic heart disease of native coronary artery without angina pectoris: Secondary | ICD-10-CM

## 2010-08-13 DIAGNOSIS — M629 Disorder of muscle, unspecified: Secondary | ICD-10-CM

## 2010-08-15 ENCOUNTER — Encounter: Payer: Self-pay | Admitting: Cardiology

## 2010-08-17 ENCOUNTER — Encounter: Payer: Self-pay | Admitting: Cardiovascular Disease

## 2010-08-17 LAB — CONVERTED CEMR LAB: Prothrombin Time: 13.5 s

## 2010-08-18 NOTE — Assessment & Plan Note (Signed)
Summary: Post CABG/MV repair   Primary Provider:  Dr. Shawn Stall at [facility]   History of Present Illness: Primary Cardiologist:  Dr. Kirk Ruths  [name] is a 60 year old female with a h/o of mitral valve mass and previous CVA/TIAs. Patient had a CVA in June of 2011. 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 She was treated with Coumadin. She was readmitted in December with possible TIA symptoms; she had not been taking coumadin.  She was referred to surgery.  She was brought in to the hospital 1/24 for a pre-operative cardiac cath that demonstrated 2v CAD with D2 70% and totally occluded dRCA before the PDA.  She then underwent a mitral valve mass resection with MV repair and 2v CABG with S-D2 and S-PDA.  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  She was noted to have post-op blood loss anemia and volume overload.  She returns for follow up.  She is doing well.  She still has some chest soreness.  She denies orthopnea, PND or pedal edema.  She denies significant shortness of breath with exertion.  She denies syncope.  Her Coumadin is currently followed by home health nursing.  She will have this followed with her primary care provider after home health hands.  She has not yet enrolled in cardiac rehabilitation.  She is interested in this.  Current Medications (verified): 1)  Glucotrol 10 Mg Tabs (Glipizide) .... Take 1 Tablet By Mouth Once A Day 2)  Metformin Hcl 500 Mg Tabs (Metformin Hcl) .... Take 1 Tablet By Mouth Once A Day 3)  Lisinopril-Hydrochlorothiazide 10-12.5 Mg Tabs (Lisinopril-Hydrochlorothiazide) .[name].. 1 Tab By Mouth Once Daily 4)  Aspirin Ec 325 Mg Tbec (Aspirin) .... Take One Tablet By Mouth Daily 5)  Pravastatin Sodium 40 Mg Tabs (Pravastatin Sodium) .... Take One Tablet By Mouth Daily At Bedtime 6)  Metoprolol Tartrate 25 Mg Tabs (Metoprolol Tartrate) .... Take One Tablet By Mouth Twice A Day 7)  Warfarin Sodium 5  Mg Tabs (Warfarin Sodium) .... As Directed 8)  Metoclopramide Hcl 10 Mg Tabs (Metoclopramide Hcl) .[name].. 1 Tab By Mouth Two Times A Day 9)  Tramadol Hcl 50 Mg Tabs (Tramadol Hcl) .... As Needed  Allergies (verified): No Known Drug Allergies  Past History:  Past Medical History: CAD   a. s/p CABG 06/2010: S-D2; S-PDA MV fibroelastoma    a. s/p resection and MV repair 06/2010 Dr. [name] HYPERTENSION, ESSENTIAL NOS (ICD-401.9) HYPERLIPIDEMIA (ICD-272.4) h/o TIA (ICD-435.9) ANEMIA-IRON DEFICIENCY (ICD-280.9) DIABETES MELLITUS, TYPE II (ICD-250.00) History of right middle cerebral artery high-grade stenosis. Hx of lumber abscess 03/2006      Review of Systems       As per  the HPI.  All other systems reviewed and negative.   Vital Signs:  Patient profile:   60 year old female Height:      67 inches Weight:      215 pounds BMI:     33.80 Pulse rate:   89 / minute Resp:     16 per minute BP sitting:   124 / 79  (right arm)  Vitals Entered By: Levora Angel, CNA (August 13, 2010 9:31 AM)  Physical Exam  General:  Well nourished, well developed, in no acute distress HEENT: normal Neck: no JVD Cardiac:  normal S1, S2; RRR; no murmur Lungs:  clear to auscultation bilaterally, no wheezing, rhonchi or rales Abd: soft, nontender, no hepatomegaly Ext: no edema Skin: warm and dry Neuro:  CNs  2-12 intact, no focal abnormalities noted    EKG  Procedure date:  08/13/2010  Findings:      normal sinus rhythm Heart rate 87 Normal axis T-wave inversion in one and aVL  Impression & Recommendations:  Problem # 1:  CAD, ARTERY BYPASS GRAFT (ICD-414.04)  She is doing well.  Her chest is still somewhat sore.  We will make sure she is referred to cardiac rehabilitation.  She will continue on aspirin and statin therapy.  Orders: EKG w/ Interpretation (93000) Cardiac Rehabilitation (Cardiac Rehab)  Problem # 2:  MITRAL VALVE PAPILLARY FIBROELASTOMA (ICD-429.81) Status post  excision and mitral valve repair.  Follow up with Dr. [name] as scheduled.  She will continue on Coumadin.  This is followed by home health currently.  She was asked to followup at [facility] thereafter.  Problem # 3:  HYPERTENSION, ESSENTIAL NOS (ICD-401.9) Controlled.  Problem # 4:  HYPERLIPIDEMIA (B2193296.4) This is followed by primary care. Her updated medication list for this problem includes:    Pravastatin Sodium 40 Mg Tabs (Pravastatin sodium) .[name]... Take one tablet by mouth daily at bedtime  Problem # 5:  TIA (ICD-435.9) Continue Coumadin.  Patient Instructions: 1)  Your physician recommends referral and attendance at a Cardiac Rehab Program. as per Richardson Dopp, PA-C.Marland KitchenMarland KitchenCrenshaw. 2)  Your physician recommends that you continue on your current medications as directed. Please refer to the Current Medication list given to you today. 3)  Your physician wants you to follow-up in:  3 month with Dr. [name]. You will receive a reminder letter in the mail two months in advance. If you don't receive a letter, please call our office to schedule the follow-up appointment.

## 2010-08-18 NOTE — Medication Information (Signed)
Summary: Coumadin Clinic  Anticoagulant Therapy  Managed by: Porfirio Oar, PharmD Referring MD: [name] PCP: [name] MD Supervising MD: Haroldine Laws MD, Quillian Quince Indication 1: Atrial Fibrillation Indication 2: CVA Lab Used: [place] Site: [place] [facility] 14.6 INR POC 1.2 INR RANGE 2.0-3.0  Dietary changes: no    Health status changes: no    Bleeding/hemorrhagic complications: no    Recent/future hospitalizations: no    Any changes in medication regimen? no    Recent/future dental: no  Any missed doses?: no       Is patient compliant with meds? yes       Allergies: No Known Drug Allergies  Anticoagulation Management History:      Her anticoagulation is being managed by telephone today.  Positive risk factors for bleeding include history of CVA/TIA and presence of serious comorbidities.  Negative risk factors for bleeding include an age less than 45 years old.  The bleeding index is 'intermediate risk'.  Positive CHADS2 values include History of HTN, History of Diabetes, and Prior Stroke/CVA/TIA.  Negative CHADS2 values include Age > 66 years old.  Her last INR was 1.1 ratio.  Prothrombin time is 14.6.  Anticoagulation responsible provider: Aniella Wandrey MD, Quillian Quince.  INR POC: 1.2.    Anticoagulation Management Assessment/Plan:      The patient's current anticoagulation dose is Warfarin sodium 5 mg tabs: as directed.  The next INR is due 08/17/2010.  Anticoagulation instructions were given to patient.  Results were reviewed/authorized by Porfirio Oar, PharmD.  She was notified by Porfirio Oar PharmD.         Prior Anticoagulation Instructions: INR 1.9  Spoke with Santiago Glad with [facility] while in pt's home.  Take 10mg  x 1 then resume 7.5mg  daily.  Recheck INR in 1 week.   Current Anticoagulation Instructions: INR 1.2  Spoke with Santiago Glad while in pt's home.  Take 10mg  today and tomorrow then increase dose to 7.5mg  daily except 10mg  on Wednesday and Saturday.   Recheck INR in 1 week.

## 2010-08-18 NOTE — Progress Notes (Signed)
Summary: orders for post heart care/3rd call  Phone Note From Other Clinic Call back at [phone]   Caller: [facility] Summary of Call: karen needs order for post heart care teaching. Initial call taken by: Regan Lemming,  August 11, 2010 11:28 AM Caller: [facility] Summary of Call: karen needs order for post heart care teaching. Initial call taken by: Regan Lemming,  August 12, 2010 8:00 AM  Follow-up for Phone Call        per karen from [facility]- pt order ends on sat Neil Crouch  August 12, 2010 8:40 AM  okay given Fredia Beets, RN  August 12, 2010 5:08 PM

## 2010-08-23 ENCOUNTER — Ambulatory Visit (HOSPITAL_COMMUNITY): Payer: Medicaid Other

## 2010-08-23 ENCOUNTER — Encounter: Payer: Self-pay | Admitting: Cardiology

## 2010-08-24 ENCOUNTER — Encounter: Payer: Self-pay | Admitting: Cardiology

## 2010-08-24 NOTE — Progress Notes (Addendum)
Summary: Office Visit  Office Visit   Imported By: Roddie Mc 08/20/2010 09:06:52  _____________________________________________________________________  External Attachment:    Type:   Image     Comment:   External Document

## 2010-08-24 NOTE — Medication Information (Signed)
Summary: Coumadin Clinic  Anticoagulant Therapy  Managed by: Porfirio Oar, PharmD Referring MD: [name] PCP: Dr. Shawn Stall at [facility] MD: Burt Knack MD, Legrand Como Indication 1: Atrial Fibrillation Indication 2: CVA Lab Used: [place] Site: [place] [facility] 13.5 INR POC 1.1 INR RANGE 2.0-3.0  Dietary changes: no    Health status changes: no    Bleeding/hemorrhagic complications: no    Recent/future hospitalizations: no    Any changes in medication regimen? no    Recent/future dental: no   Is patient compliant with meds? yes      Comments: Had RN verify that pt is taking medication. Pt does not use a pill box so unable to fix her weekly dose for her.  The last time her Coumadin was refilled was first part of January for 45 tablets.  Pt should be out of pills but states she put her old bottle in with the new bottle so she has extra pills so unsure if pt is compliant.   Allergies: No Known Drug Allergies  Anticoagulation Management History:      Her anticoagulation is being managed by telephone today.  Positive risk factors for bleeding include history of CVA/TIA and presence of serious comorbidities.  Negative risk factors for bleeding include an age less than 52 years old.  The bleeding index is 'intermediate risk'.  Positive CHADS2 values include History of HTN, History of Diabetes, and Prior Stroke/CVA/TIA.  Negative CHADS2 values include Age > 77 years old.  Her last INR was 1.1 ratio.  Prothrombin time is 13.5.  Anticoagulation responsible provider: Burt Knack MD, Legrand Como.  INR POC: 1.1.    Anticoagulation Management Assessment/Plan:      The patient's current anticoagulation dose is Warfarin sodium 5 mg tabs: as directed.  The target INR is 2.0-3.0.  The next INR is due 08/24/2010.  Anticoagulation instructions were given to patient.  Results were reviewed/authorized by Porfirio Oar, PharmD.  She was notified by Porfirio Oar PharmD.         Prior  Anticoagulation Instructions: INR 1.2  Spoke with Santiago Glad while in pt's home.  Take 10mg  today and tomorrow then increase dose to 7.5mg  daily except 10mg  on Wednesday and Saturday.  Recheck INR in 1 week.   Current Anticoagulation Instructions: INR 1.1  Spoke with Santiago Glad with [facility] while in pt's home.  Increase dose to 10mg  daily except 7.5mg  on Monday, Wednesday and Friday.  Recheck INR in 1 week.

## 2010-08-24 NOTE — Progress Notes (Signed)
Summary: [facility]: Office Visit  [facility]: Office Visit   Imported By: Roddie Mc 08/17/2010 18:16:58  _____________________________________________________________________  External Attachment:    Type:   Image     Comment:   External Document

## 2010-08-25 ENCOUNTER — Ambulatory Visit (HOSPITAL_COMMUNITY): Payer: Medicaid Other

## 2010-08-27 ENCOUNTER — Ambulatory Visit (HOSPITAL_COMMUNITY): Payer: Medicaid Other

## 2010-08-30 ENCOUNTER — Ambulatory Visit (HOSPITAL_COMMUNITY): Payer: Medicaid Other

## 2010-08-31 ENCOUNTER — Encounter (INDEPENDENT_AMBULATORY_CARE_PROVIDER_SITE_OTHER): Payer: Medicaid Other

## 2010-08-31 ENCOUNTER — Encounter: Payer: Self-pay | Admitting: Cardiology

## 2010-08-31 DIAGNOSIS — I4891 Unspecified atrial fibrillation: Secondary | ICD-10-CM

## 2010-08-31 DIAGNOSIS — I6789 Other cerebrovascular disease: Secondary | ICD-10-CM

## 2010-08-31 DIAGNOSIS — Z7901 Long term (current) use of anticoagulants: Secondary | ICD-10-CM

## 2010-08-31 LAB — CONVERTED CEMR LAB: POC INR: 1.4

## 2010-09-01 ENCOUNTER — Ambulatory Visit (HOSPITAL_COMMUNITY): Payer: Medicaid Other

## 2010-09-02 NOTE — Medication Information (Signed)
Summary: [facility]  Anticoagulant Therapy  Managed by: Tula Nakayama, RN, BSN Referring MD: [name] PCP: Dr. Shawn Stall at [place] MD: Aundra Dubin MD, Piya Mesch Indication 1: Atrial Fibrillation Indication 2: CVA Lab Used: [place] Site: [place] PT 16.5 INR POC 1.4 INR RANGE 2.0-3.0  Dietary changes: no    Health status changes: no    Bleeding/hemorrhagic complications: no    Recent/future hospitalizations: no    Any changes in medication regimen? no    Recent/future dental: no  Any missed doses?: no       Is patient compliant with meds? yes       Allergies: No Known Drug Allergies  Anticoagulation Management History:      Her anticoagulation is being managed by telephone today.  Positive risk factors for bleeding include history of CVA/TIA and presence of serious comorbidities.  Negative risk factors for bleeding include an age less than 40 years old.  The bleeding index is 'intermediate risk'.  Positive CHADS2 values include History of HTN, History of Diabetes, and Prior Stroke/CVA/TIA.  Negative CHADS2 values include Age > 73 years old.  Her last INR was 1.1 ratio.  Prothrombin time is 16.5.  Anticoagulation responsible provider: Aundra Dubin MD, Keaton Stirewalt.  INR POC: 1.4.    Anticoagulation Management Assessment/Plan:      The patient's current anticoagulation dose is Warfarin sodium 5 mg tabs: as directed.  The target INR is 2.0-3.0.  The next INR is due 08/31/2010.  Anticoagulation instructions were given to patient.  Results were reviewed/authorized by Tula Nakayama, RN, BSN.  She was notified by Tula Nakayama, RN, BSN.         Prior Anticoagulation Instructions: INR 1.1  Spoke with Santiago Glad with [facility] while in pt's home.  Increase dose to 10mg  daily except 7.5mg  on Monday, Wednesday and Friday.  Recheck INR in 1 week.   Current Anticoagulation Instructions: INR 1.4 Today take 12.5mg s then change dose to 10mg s daily except 7.5mg s on  Mondays. Recheck in one week at [place], [facility] discharging  today.

## 2010-09-03 ENCOUNTER — Ambulatory Visit (HOSPITAL_COMMUNITY): Payer: Medicaid Other

## 2010-09-06 ENCOUNTER — Ambulatory Visit (HOSPITAL_COMMUNITY): Payer: Medicaid Other

## 2010-09-06 LAB — LIPID PANEL
Triglycerides: 127 mg/dL (ref <150)
VLDL: 25 mg/dL (ref 0–40)

## 2010-09-06 LAB — HEMOGLOBIN A1C
Hgb A1c MFr Bld: 7.2 % — ABNORMAL HIGH (ref <5.7)
Mean Plasma Glucose: 160 mg/dL — ABNORMAL HIGH (ref <117)

## 2010-09-06 LAB — PROTIME-INR
INR: 0.89 (ref 0.00–1.49)
INR: 0.89 (ref 0.00–1.49)
INR: 1.11 (ref 0.00–1.49)
Prothrombin Time: 12.2 seconds (ref 11.6–15.2)
Prothrombin Time: 12.2 seconds (ref 11.6–15.2)
Prothrombin Time: 12.8 seconds (ref 11.6–15.2)

## 2010-09-06 LAB — URINALYSIS, ROUTINE W REFLEX MICROSCOPIC
Nitrite: NEGATIVE
Specific Gravity, Urine: 1.03 (ref 1.005–1.030)
pH: 5.5 (ref 5.0–8.0)

## 2010-09-06 LAB — GLUCOSE, CAPILLARY
Glucose-Capillary: 107 mg/dL — ABNORMAL HIGH (ref 70–99)
Glucose-Capillary: 114 mg/dL — ABNORMAL HIGH (ref 70–99)
Glucose-Capillary: 124 mg/dL — ABNORMAL HIGH (ref 70–99)
Glucose-Capillary: 131 mg/dL — ABNORMAL HIGH (ref 70–99)
Glucose-Capillary: 133 mg/dL — ABNORMAL HIGH (ref 70–99)
Glucose-Capillary: 162 mg/dL — ABNORMAL HIGH (ref 70–99)
Glucose-Capillary: 165 mg/dL — ABNORMAL HIGH (ref 70–99)
Glucose-Capillary: 174 mg/dL — ABNORMAL HIGH (ref 70–99)
Glucose-Capillary: 90 mg/dL (ref 70–99)

## 2010-09-06 LAB — CBC
HCT: 32.4 % — ABNORMAL LOW (ref 36.0–46.0)
HCT: 35 % — ABNORMAL LOW (ref 36.0–46.0)
Hemoglobin: 10.5 g/dL — ABNORMAL LOW (ref 12.0–15.0)
MCH: 26.4 pg (ref 26.0–34.0)
MCH: 26.6 pg (ref 26.0–34.0)
MCHC: 32.4 g/dL (ref 30.0–36.0)
MCV: 82 fL (ref 78.0–100.0)
MCV: 82.2 fL (ref 78.0–100.0)
Platelets: 244 10*3/uL (ref 150–400)
RBC: 4.26 MIL/uL (ref 3.87–5.11)
RDW: 14.6 % (ref 11.5–15.5)
RDW: 14.6 % (ref 11.5–15.5)
WBC: 6.3 10*3/uL (ref 4.0–10.5)
WBC: 8 10*3/uL (ref 4.0–10.5)

## 2010-09-06 LAB — BASIC METABOLIC PANEL
BUN: 19 mg/dL (ref 6–23)
Chloride: 107 mEq/L (ref 96–112)
Chloride: 108 mEq/L (ref 96–112)
GFR calc Af Amer: 55 mL/min — ABNORMAL LOW (ref >60)
GFR calc Af Amer: 58 mL/min — ABNORMAL LOW (ref >60)
GFR calc non Af Amer: 46 mL/min — ABNORMAL LOW (ref >60)
Potassium: 3.9 mEq/L (ref 3.5–5.1)
Potassium: 4.4 mEq/L (ref 3.5–5.1)

## 2010-09-06 LAB — URINE MICROSCOPIC-ADD ON

## 2010-09-06 LAB — COMPREHENSIVE METABOLIC PANEL
ALT: 12 U/L (ref 0–35)
BUN: 22 mg/dL (ref 6–23)
CO2: 25 mEq/L (ref 19–32)
Calcium: 8.8 mg/dL (ref 8.4–10.5)
Creatinine, Ser: 1.28 mg/dL — ABNORMAL HIGH (ref 0.4–1.2)
GFR calc non Af Amer: 43 mL/min — ABNORMAL LOW (ref >60)
Glucose, Bld: 124 mg/dL — ABNORMAL HIGH (ref 70–99)
Sodium: 139 mEq/L (ref 135–145)
Total Protein: 5.7 g/dL — ABNORMAL LOW (ref 6.0–8.3)

## 2010-09-06 LAB — URINE DRUGS OF ABUSE SCREEN W ALC, ROUTINE (REF LAB)
Benzodiazepines.: NEGATIVE
Ethyl Alcohol: <10 mg/dL (ref <10)
Marijuana Metabolite: NEGATIVE
Methadone: NEGATIVE
Propoxyphene: NEGATIVE

## 2010-09-06 LAB — CARDIAC PANEL(CRET KIN+CKTOT+MB+TROPI)
CK, MB: 1 ng/mL (ref 0.3–4.0)
Relative Index: INVALID (ref 0.0–2.5)
Total CK: 73 U/L (ref 7–177)

## 2010-09-06 LAB — COCAINE, URINE, CONFIRMATION: Benzoylecgonine GC/MS Conf: 120335 NG/ML — ABNORMAL HIGH

## 2010-09-06 LAB — TSH: TSH: 1.559 u[IU]/mL (ref 0.350–4.500)

## 2010-09-07 NOTE — Letter (Signed)
Summary: [facility] Office Visit Note   [facility] Visit Note   Imported By: Sallee Provencal 08/31/2010 14:45:10  _____________________________________________________________________  External Attachment:    Type:   Image     Comment:   External Document

## 2010-09-07 NOTE — Medication Information (Signed)
Summary: rov/tm  Anticoagulant Therapy  Managed by: Tula Nakayama, RN, BSN Referring MD: [name] PCP: Dr. Shawn Stall at [place] MD: Percival Spanish MD, Jeneen Rinks Indication 1: Atrial Fibrillation Indication 2: CVA Lab Used: [facility] [place] Site: [place] INR POC 1.4 INR RANGE 2.0-3.0  Dietary changes: yes       Details: Ate less green  Health status changes: no    Bleeding/hemorrhagic complications: no    Recent/future hospitalizations: no    Any changes in medication regimen? no    Recent/future dental: no  Any missed doses?: no       Is patient compliant with meds? yes       Allergies: No Known Drug Allergies  Anticoagulation Management History:      The patient is taking warfarin and comes in today for a routine follow up visit.  Positive risk factors for bleeding include history of CVA/TIA and presence of serious comorbidities.  Negative risk factors for bleeding include an age less than 19 years old.  The bleeding index is 'intermediate risk'.  Positive CHADS2 values include History of HTN, History of Diabetes, and Prior Stroke/CVA/TIA.  Negative CHADS2 values include Age > 53 years old.  Her last INR was 1.1 ratio.  Anticoagulation responsible provider: Percival Spanish MD, Jeneen Rinks.  INR POC: 1.4.  Cuvette Lot#: [id number].  Exp: 06/2011.    Anticoagulation Management Assessment/Plan:      The patient's current anticoagulation dose is Warfarin sodium 5 mg tabs: as directed.  The target INR is 2.0-3.0.  The next INR is due 09/09/2010.  Anticoagulation instructions were given to patient.  Results were reviewed/authorized by Tula Nakayama, RN, BSN.  She was notified by Tula Nakayama, RN, BSN.         Prior Anticoagulation Instructions: INR 1.4 Today take 12.5mg s then change dose to 10mg s daily except 7.5mg s on Mondays. Recheck in one week at [place], [facility] discharging  today.   Current Anticoagulation Instructions: INR 1.4 Today take 3 pills then change dose to 2  pills everyday except 2.5 pills on Tuesdays and Saturdays. Recheck in one week.

## 2010-09-08 ENCOUNTER — Ambulatory Visit (HOSPITAL_COMMUNITY): Payer: Medicaid Other

## 2010-09-09 ENCOUNTER — Encounter: Payer: Self-pay | Admitting: Internal Medicine

## 2010-09-09 ENCOUNTER — Encounter: Payer: Self-pay | Admitting: Cardiology

## 2010-09-09 ENCOUNTER — Encounter (INDEPENDENT_AMBULATORY_CARE_PROVIDER_SITE_OTHER): Payer: Medicaid Other

## 2010-09-09 DIAGNOSIS — I6789 Other cerebrovascular disease: Secondary | ICD-10-CM

## 2010-09-09 DIAGNOSIS — I4891 Unspecified atrial fibrillation: Secondary | ICD-10-CM

## 2010-09-09 DIAGNOSIS — G459 Transient cerebral ischemic attack, unspecified: Secondary | ICD-10-CM

## 2010-09-09 DIAGNOSIS — Z7901 Long term (current) use of anticoagulants: Secondary | ICD-10-CM

## 2010-09-10 ENCOUNTER — Encounter: Payer: Self-pay | Admitting: Cardiology

## 2010-09-10 ENCOUNTER — Ambulatory Visit (HOSPITAL_COMMUNITY): Payer: Medicaid Other

## 2010-09-10 DIAGNOSIS — G459 Transient cerebral ischemic attack, unspecified: Secondary | ICD-10-CM

## 2010-09-10 DIAGNOSIS — I4891 Unspecified atrial fibrillation: Secondary | ICD-10-CM

## 2010-09-10 DIAGNOSIS — Z7901 Long term (current) use of anticoagulants: Secondary | ICD-10-CM

## 2010-09-12 LAB — BASIC METABOLIC PANEL
BUN: 18 mg/dL (ref 6–23)
BUN: 12 mg/dL (ref 6–23)
BUN: 13 mg/dL (ref 6–23)
CO2: 24 mEq/L (ref 19–32)
CO2: 28 mEq/L (ref 19–32)
Calcium: 8.8 mg/dL (ref 8.4–10.5)
Calcium: 8.4 mg/dL (ref 8.4–10.5)
Calcium: 8.9 mg/dL (ref 8.4–10.5)
Chloride: 107 mEq/L (ref 96–112)
Chloride: 105 mEq/L (ref 96–112)
Chloride: 110 mEq/L (ref 96–112)
Creatinine, Ser: 0.84 mg/dL (ref 0.4–1.2)
GFR calc Af Amer: >60 mL/min (ref >60)
GFR calc Af Amer: >60 mL/min (ref >60)
GFR calc Af Amer: >60 mL/min (ref >60)
GFR calc non Af Amer: >60 mL/min (ref >60)
GFR calc non Af Amer: >60 mL/min (ref >60)
GFR calc non Af Amer: >60 mL/min (ref >60)
Glucose, Bld: 132 mg/dL — ABNORMAL HIGH (ref 70–99)
Glucose, Bld: 136 mg/dL — ABNORMAL HIGH (ref 70–99)
Glucose, Bld: 170 mg/dL — ABNORMAL HIGH (ref 70–99)
Potassium: 4.2 mEq/L (ref 3.5–5.1)
Potassium: 3.7 mEq/L (ref 3.5–5.1)
Potassium: 3.7 mEq/L (ref 3.5–5.1)
Sodium: 137 mEq/L (ref 135–145)
Sodium: 139 mEq/L (ref 135–145)
Sodium: 137 mEq/L (ref 135–145)
Sodium: 139 mEq/L (ref 135–145)

## 2010-09-12 LAB — GLUCOSE, CAPILLARY
Glucose-Capillary: 116 mg/dL — ABNORMAL HIGH (ref 70–99)
Glucose-Capillary: 149 mg/dL — ABNORMAL HIGH (ref 70–99)
Glucose-Capillary: 162 mg/dL — ABNORMAL HIGH (ref 70–99)
Glucose-Capillary: 166 mg/dL — ABNORMAL HIGH (ref 70–99)
Glucose-Capillary: 167 mg/dL — ABNORMAL HIGH (ref 70–99)
Glucose-Capillary: 180 mg/dL — ABNORMAL HIGH (ref 70–99)
Glucose-Capillary: 78 mg/dL (ref 70–99)
Glucose-Capillary: 110 mg/dL — ABNORMAL HIGH (ref 70–99)
Glucose-Capillary: 139 mg/dL — ABNORMAL HIGH (ref 70–99)
Glucose-Capillary: 142 mg/dL — ABNORMAL HIGH (ref 70–99)
Glucose-Capillary: 144 mg/dL — ABNORMAL HIGH (ref 70–99)
Glucose-Capillary: 151 mg/dL — ABNORMAL HIGH (ref 70–99)
Glucose-Capillary: 157 mg/dL — ABNORMAL HIGH (ref 70–99)
Glucose-Capillary: 158 mg/dL — ABNORMAL HIGH (ref 70–99)
Glucose-Capillary: 193 mg/dL — ABNORMAL HIGH (ref 70–99)
Glucose-Capillary: 223 mg/dL — ABNORMAL HIGH (ref 70–99)

## 2010-09-12 LAB — PROTIME-INR
INR: 0.9 (ref 0.00–1.49)
INR: 1 (ref 0.00–1.49)
Prothrombin Time: 11.5 seconds — ABNORMAL LOW (ref 11.6–15.2)
Prothrombin Time: 12.1 seconds (ref 11.6–15.2)
Prothrombin Time: 13.1 seconds (ref 11.6–15.2)

## 2010-09-12 LAB — CBC
HCT: 33.1 % — ABNORMAL LOW (ref 36.0–46.0)
HCT: 33.9 % — ABNORMAL LOW (ref 36.0–46.0)
HCT: 32.8 % — ABNORMAL LOW (ref 36.0–46.0)
HCT: 37.3 % (ref 36.0–46.0)
Hemoglobin: 11 g/dL — ABNORMAL LOW (ref 12.0–15.0)
Hemoglobin: 11.1 g/dL — ABNORMAL LOW (ref 12.0–15.0)
Hemoglobin: 10.7 g/dL — ABNORMAL LOW (ref 12.0–15.0)
Hemoglobin: 12.4 g/dL (ref 12.0–15.0)
MCH: 27.1 pg (ref 26.0–34.0)
MCH: 27.2 pg (ref 26.0–34.0)
MCH: 27.6 pg (ref 26.0–34.0)
MCHC: 32.8 g/dL (ref 30.0–36.0)
MCHC: 32.9 g/dL (ref 30.0–36.0)
MCHC: 33.2 g/dL (ref 30.0–36.0)
MCHC: 33.4 g/dL (ref 30.0–36.0)
MCV: 82.2 fL (ref 78.0–100.0)
MCV: 82.3 fL (ref 78.0–100.0)
MCV: 82.5 fL (ref 78.0–100.0)
Platelets: 179 10*3/uL (ref 150–400)
Platelets: 194 10*3/uL (ref 150–400)
Platelets: 225 10*3/uL (ref 150–400)
Platelets: 166 10*3/uL (ref 150–400)
RBC: 3.89 MIL/uL (ref 3.87–5.11)
RBC: 4.09 MIL/uL (ref 3.87–5.11)
RBC: 3.66 MIL/uL — ABNORMAL LOW (ref 3.87–5.11)
RBC: 3.98 MIL/uL (ref 3.87–5.11)
RDW: 14.9 % (ref 11.5–15.5)
RDW: 14.7 % (ref 11.5–15.5)
WBC: 6 10*3/uL (ref 4.0–10.5)
WBC: 7.1 10*3/uL (ref 4.0–10.5)
WBC: 5.7 10*3/uL (ref 4.0–10.5)
WBC: 6.3 10*3/uL (ref 4.0–10.5)
WBC: 7.3 10*3/uL (ref 4.0–10.5)

## 2010-09-12 LAB — URINALYSIS, MICROSCOPIC ONLY
Bilirubin Urine: NEGATIVE
Ketones, ur: NEGATIVE mg/dL
Nitrite: NEGATIVE
Protein, ur: 100 mg/dL — AB

## 2010-09-12 LAB — COMPREHENSIVE METABOLIC PANEL
Albumin: 3.1 g/dL — ABNORMAL LOW (ref 3.5–5.2)
Alkaline Phosphatase: 52 U/L (ref 39–117)
BUN: 13 mg/dL (ref 6–23)
Chloride: 106 mEq/L (ref 96–112)
Potassium: 4.4 mEq/L (ref 3.5–5.1)
Total Bilirubin: 0.2 mg/dL — ABNORMAL LOW (ref 0.3–1.2)

## 2010-09-12 LAB — LIPID PANEL
HDL: 57 mg/dL (ref >39)
Triglycerides: 245 mg/dL — ABNORMAL HIGH (ref <150)
VLDL: 49 mg/dL — ABNORMAL HIGH (ref 0–40)

## 2010-09-12 LAB — CULTURE, BLOOD (ROUTINE X 2): Culture: NO GROWTH

## 2010-09-12 LAB — HEMOCCULT GUIAC POC 1CARD (OFFICE): Fecal Occult Bld: POSITIVE

## 2010-09-12 LAB — LIPASE, BLOOD: Lipase: 61 U/L — ABNORMAL HIGH (ref 11–59)

## 2010-09-12 LAB — ANA: Anti Nuclear Antibody(ANA): NEGATIVE

## 2010-09-12 LAB — CARDIAC PANEL(CRET KIN+CKTOT+MB+TROPI): CK, MB: 1.2 ng/mL (ref 0.3–4.0)

## 2010-09-12 LAB — HEMOGLOBIN A1C: Mean Plasma Glucose: 226 mg/dL — ABNORMAL HIGH (ref <117)

## 2010-09-12 LAB — MRSA PCR SCREENING: MRSA by PCR: NEGATIVE

## 2010-09-12 LAB — AMYLASE: Amylase: 178 U/L — ABNORMAL HIGH (ref 0–105)

## 2010-09-13 ENCOUNTER — Ambulatory Visit (HOSPITAL_COMMUNITY): Payer: Medicaid Other

## 2010-09-14 NOTE — Medication Information (Signed)
Summary: rov/tm  Anticoagulant Therapy  Managed by: Tula Nakayama, RN, BSN Referring MD: [name] PCP: Dr. Shawn Stall at [place] MD: Blaire Palomino MD, Jeneen Rinks Indication 1: Atrial Fibrillation Indication 2: CVA Lab Used: [facility] [place] Site: [place] INR POC 1.4 INR RANGE 2.0-3.0  Dietary changes: no    Health status changes: no    Bleeding/hemorrhagic complications: no    Recent/future hospitalizations: no    Any changes in medication regimen? no    Recent/future dental: no  Any missed doses?: yes     Details: missed Tuesday's dose  Is patient compliant with meds? yes       Allergies: No Known Drug Allergies  Anticoagulation Management History:      The patient is taking warfarin and comes in today for a routine follow up visit.  Positive risk factors for bleeding include history of CVA/TIA and presence of serious comorbidities.  Negative risk factors for bleeding include an age less than 43 years old.  The bleeding index is 'intermediate risk'.  Positive CHADS2 values include History of HTN, History of Diabetes, and Prior Stroke/CVA/TIA.  Negative CHADS2 values include Age > 51 years old.  Her last INR was 1.1 ratio.  Anticoagulation responsible provider: Tammera Engert MD, Jeneen Rinks.  INR POC: 1.4.  Cuvette Lot#: [id number].  Exp: 08/2011.    Anticoagulation Management Assessment/Plan:      The patient's current anticoagulation dose is Warfarin sodium 5 mg tabs: as directed.  The target INR is 2.0-3.0.  The next INR is due 09/16/2010.  Anticoagulation instructions were given to patient.  Results were reviewed/authorized by Tula Nakayama, RN, BSN.  She was notified by Freddrick March RN.         Prior Anticoagulation Instructions: INR 1.4 Today take 3 pills then change dose to 2 pills everyday except 2.5 pills on Tuesdays and Saturdays. Recheck in one week.   Current Anticoagulation Instructions: INR 1.4 Today take 3 pills then resume 2 pills everyday  except 2.5 pills on Tuesdays and Saturdays. Recheck in one week.

## 2010-09-15 ENCOUNTER — Ambulatory Visit (HOSPITAL_COMMUNITY): Payer: Medicaid Other

## 2010-09-16 ENCOUNTER — Ambulatory Visit (INDEPENDENT_AMBULATORY_CARE_PROVIDER_SITE_OTHER): Payer: Medicaid Other | Admitting: *Deleted

## 2010-09-16 DIAGNOSIS — G459 Transient cerebral ischemic attack, unspecified: Secondary | ICD-10-CM

## 2010-09-16 DIAGNOSIS — I4891 Unspecified atrial fibrillation: Secondary | ICD-10-CM

## 2010-09-16 DIAGNOSIS — Z7901 Long term (current) use of anticoagulants: Secondary | ICD-10-CM

## 2010-09-16 NOTE — Patient Instructions (Signed)
Start taking 2 tablets daily except 2.5 tablets on Tuesdays, Thursdays, and Saturdays.  Recheck in 1 week.

## 2010-09-17 ENCOUNTER — Ambulatory Visit (HOSPITAL_COMMUNITY): Payer: Medicaid Other

## 2010-09-20 ENCOUNTER — Ambulatory Visit (HOSPITAL_COMMUNITY): Payer: Medicaid Other

## 2010-09-22 ENCOUNTER — Ambulatory Visit (HOSPITAL_COMMUNITY): Payer: Medicaid Other

## 2010-09-23 ENCOUNTER — Ambulatory Visit (INDEPENDENT_AMBULATORY_CARE_PROVIDER_SITE_OTHER): Payer: Medicaid Other | Admitting: *Deleted

## 2010-09-23 DIAGNOSIS — Z7901 Long term (current) use of anticoagulants: Secondary | ICD-10-CM

## 2010-09-23 DIAGNOSIS — G459 Transient cerebral ischemic attack, unspecified: Secondary | ICD-10-CM

## 2010-09-23 DIAGNOSIS — I4891 Unspecified atrial fibrillation: Secondary | ICD-10-CM

## 2010-09-23 NOTE — Letter (Signed)
Summary: Cardiac Rehab  Cardiac Rehab   Imported By: Marilynne Drivers 09/13/2010 16:43:09  _____________________________________________________________________  External Attachment:    Type:   Image     Comment:   External Document

## 2010-09-23 NOTE — Letter (Signed)
Summary: Cardiac Rehab Program  Cardiac Rehab Program   Imported By: Marilynne Drivers 09/13/2010 16:43:47  _____________________________________________________________________  External Attachment:    Type:   Image     Comment:   External Document

## 2010-09-23 NOTE — Patient Instructions (Signed)
INR 2.2 Return to clinic in 3 weeks Try and not to miss any doses Cont with current regimen

## 2010-09-23 NOTE — Letter (Signed)
Summary: [employer]   Imported By: Marilynne Drivers 09/13/2010 16:42:22  _____________________________________________________________________  External Attachment:    Type:   Image     Comment:   External Document

## 2010-09-24 ENCOUNTER — Ambulatory Visit (HOSPITAL_COMMUNITY): Payer: Medicaid Other

## 2010-09-27 ENCOUNTER — Ambulatory Visit (HOSPITAL_COMMUNITY): Payer: Medicaid Other

## 2010-09-29 ENCOUNTER — Ambulatory Visit (HOSPITAL_COMMUNITY): Payer: Medicaid Other

## 2010-10-01 ENCOUNTER — Ambulatory Visit (HOSPITAL_COMMUNITY): Payer: Medicaid Other

## 2010-10-03 LAB — POCT CARDIAC MARKERS
Myoglobin, poc: 70.1 ng/mL (ref 12–200)
Troponin i, poc: <0.05 ng/mL (ref 0.00–0.09)

## 2010-10-03 LAB — GLUCOSE, CAPILLARY

## 2010-10-03 LAB — POCT I-STAT, CHEM 8
BUN: 11 mg/dL (ref 6–23)
Calcium, Ion: 1.13 mmol/L (ref 1.12–1.32)
Chloride: 103 mEq/L (ref 96–112)
HCT: 38 % (ref 36.0–46.0)
Potassium: 4.2 mEq/L (ref 3.5–5.1)
Sodium: 136 mEq/L (ref 135–145)

## 2010-10-04 ENCOUNTER — Ambulatory Visit (HOSPITAL_COMMUNITY): Payer: Medicaid Other

## 2010-10-06 ENCOUNTER — Ambulatory Visit (HOSPITAL_COMMUNITY): Payer: Medicaid Other

## 2010-10-08 ENCOUNTER — Ambulatory Visit (HOSPITAL_COMMUNITY): Payer: Medicaid Other

## 2010-10-11 ENCOUNTER — Ambulatory Visit (HOSPITAL_COMMUNITY): Payer: Medicaid Other

## 2010-10-13 ENCOUNTER — Ambulatory Visit (HOSPITAL_COMMUNITY): Payer: Medicaid Other

## 2010-10-14 ENCOUNTER — Encounter: Payer: Medicaid Other | Admitting: *Deleted

## 2010-10-15 ENCOUNTER — Ambulatory Visit (HOSPITAL_COMMUNITY): Payer: Medicaid Other

## 2010-10-18 ENCOUNTER — Ambulatory Visit (HOSPITAL_COMMUNITY): Payer: Medicaid Other

## 2010-10-20 ENCOUNTER — Ambulatory Visit (HOSPITAL_COMMUNITY): Payer: Medicaid Other

## 2010-10-22 ENCOUNTER — Ambulatory Visit (HOSPITAL_COMMUNITY): Payer: Medicaid Other

## 2010-10-25 ENCOUNTER — Ambulatory Visit (HOSPITAL_COMMUNITY): Payer: Medicaid Other

## 2010-10-27 ENCOUNTER — Ambulatory Visit (HOSPITAL_COMMUNITY): Payer: Medicaid Other

## 2010-10-29 ENCOUNTER — Ambulatory Visit (HOSPITAL_COMMUNITY): Payer: Medicaid Other

## 2010-10-29 ENCOUNTER — Ambulatory Visit (INDEPENDENT_AMBULATORY_CARE_PROVIDER_SITE_OTHER): Payer: Medicaid Other | Admitting: *Deleted

## 2010-10-29 DIAGNOSIS — Z7901 Long term (current) use of anticoagulants: Secondary | ICD-10-CM

## 2010-10-29 DIAGNOSIS — I4891 Unspecified atrial fibrillation: Secondary | ICD-10-CM

## 2010-10-29 DIAGNOSIS — G459 Transient cerebral ischemic attack, unspecified: Secondary | ICD-10-CM

## 2010-10-29 LAB — POCT INR: INR: 1.8

## 2010-10-29 MED ORDER — WARFARIN SODIUM 5 MG PO TABS: 5.0000 mg | ORAL_TABLET | ORAL | Status: DC

## 2010-11-01 ENCOUNTER — Encounter (INDEPENDENT_AMBULATORY_CARE_PROVIDER_SITE_OTHER): Payer: Medicaid Other | Admitting: Thoracic Surgery (Cardiothoracic Vascular Surgery)

## 2010-11-01 ENCOUNTER — Ambulatory Visit (HOSPITAL_COMMUNITY): Payer: Medicaid Other

## 2010-11-01 DIAGNOSIS — I359 Nonrheumatic aortic valve disorder, unspecified: Secondary | ICD-10-CM

## 2010-11-01 DIAGNOSIS — I2510 Atherosclerotic heart disease of native coronary artery without angina pectoris: Secondary | ICD-10-CM

## 2010-11-02 NOTE — Assessment & Plan Note (Signed)
OFFICE VISIT  [name], [name] DOB:  Dec 21, 1950                                        Nov 01, 2010 CHART #:  [id number]  HISTORY OF PRESENT ILLNESS:  The patient returns to the office today for routine followup now approximately 3 months status post coronary artery bypass grafting x2, mitral valve repair and resection of mitral valve papillary fibroelastoma.  She was last seen here in the office on August 06, 2010.  Since then she has done very well.  She is proud of the fact that she continues to abstain from any tobacco use.  Her activity level is good.  She has no shortness of breath.  She has not had any tachypalpitations or dizzy spells.  The remainder of her review of systems is unremarkable.  PHYSICAL EXAMINATION:  Notable for well-appearing female.  Blood pressure 112/77, pulse 65 regular, oxygen saturation 98% on room air. Examination of the chest reveals a well-healed median sternotomy scar. The sternum is stable on palpation.  Auscultation of the chest reveals clear breath sounds that are symmetrical bilaterally.  No wheezes, rales, or rhonchi are noted.  Cardiovascular exam includes regular rate and rhythm.  No murmurs, rubs, or gallops are noted.  The abdomen is soft, nontender.  The extremities are warm and well perfused.  There is no lower extremity edema.  IMPRESSION:  Excellent progress following mitral valve repair, resection of papillary fibroelastoma, and coronary artery bypass grafting.  PLAN:  In the future, the patient will call and return to see [place] as needed.  At this point, she has no significant physical limitations with respect to her previous surgery.  All of her questions have been addressed.  Valentina Gu. [name], M.D. Electronically Signed  CHO/MEDQ  D:  11/01/2010  T:  11/01/2010  Job:  [id number]  cc:   [name]. [name], MD, [place]. Leonie Man, MD [name], M.D.

## 2010-11-03 ENCOUNTER — Ambulatory Visit (HOSPITAL_COMMUNITY): Payer: Medicaid Other

## 2010-11-05 ENCOUNTER — Ambulatory Visit (HOSPITAL_COMMUNITY): Payer: Medicaid Other

## 2010-11-08 ENCOUNTER — Ambulatory Visit (HOSPITAL_COMMUNITY): Payer: Medicaid Other

## 2010-11-09 NOTE — Consult Note (Signed)
NEW PATIENT CONSULTATION   [name], [name]  DOB:  11/19/1950                                        July 12, 2010  CHART #:  [id number]   REASON FOR CONSULTATION:  Mitral valve mass.   HISTORY OF PRESENT ILLNESS:  The patient is a 60 year old obese African  American female from [place] with history of hypertension, type 2  diabetes mellitus, hyperlipidemia, and cerebrovascular disease.  The  patient suffered an acute embolic stroke in June [id number] for which she was  treated at [facility].  MRI and MRA of the brain  revealed multiple punctate areas in the right posterior frontal, right  parietal and right occipital lobes consistent with acute embolic  infarction.  There were other scattered areas of small-vessel disease.  MRA revealed what was felt to be high-grade stenosis of the proximal  right middle cerebral artery.  Transesophageal echocardiogram performed  during that hospitalization revealed the presence of a small mass  attached to the atrial surface of the middle scalp of the posterior  leaflet of the mitral valve consistent with a benign tumor.  The patient  was discharged home with physical therapy on long-term Coumadin therapy.  She gradually recovered from the stroke, although the patient returned  in December with episodes of increased slurred speech as well as  numbness and clumsiness of the left hand.  These symptoms seem to come  and go.  It was noted that the patient had been noncompliant with a  Coumadin at that time.  Repeat MRI at that time did not reveal any new  findings.  The patient's symptoms resolved and she was discharged home.  She was seen in followup by [facility]. [facility], and she  was subsequently referred to Dr. Kirk Ruths for followup of her  mitral valve mass.  Repeat transesophageal echocardiogram was performed  December 28, by Dr. [name].  This confirmed the presence of a mass on  the atrial surface of the posterior leaf of the mitral valve suspicious  for a benign papillary fibroelastoma.  There was mild mitral  regurgitation.  There is normal left ventricular function.  No other  abnormalities were noted.  The patient was referred for possible  surgical intervention.   REVIEW OF SYSTEMS:  GENERAL:  The patient reports gradual progressive  exertional fatigue.  She states that she does not have much of an  appetite, although she has not been gaining nor losing weight over the  last 6 months.  VITAL SIGNS:  She is 5 feet, 7-1/2 inches tall and weighs approximately  227 pounds.  CARDIAC:  The patient does describe symptoms of exertional shortness of  breath as well as occasional palpitations.  She is not having any chest  pain, chest tightness and chest pressure.  She has occasional bilateral  lower extremity edema.  RESPIRATORY:  Notable for intermittent cough.  The patient denies  productive cough, hemoptysis, wheezing.  GASTROINTESTINAL:  Notable for chronic constipation.  The patient  reports no difficulty swallowing.  She denies hematochezia, hematemesis,  melena.  GENITOURINARY:  Negative.  PERIPHERAL VASCULAR:  Negative.  NEUROLOGIC:  Notable for history of stroke this past June manifest as  left-sided numbness and weakness particularly afflicting her left leg  more than her left arm.  She also had associated slurred speech.  She  still has spells, where her slurred speech and clumsiness with her hand  and foot seemed to get a little bit worse as she described in December.  She has not had any other issues other than chronic mild instability of  gait.  She walks with a cane.  She does have frequent headaches and  notes that she has headaches almost every day.  This has been stable for  the past 6 weeks.  MUSCULOSKELETAL:  Notable for chronic problems with arthritis afflicting  her back and shoulders.  PSYCHIATRIC:  Notable for some depression related to  her physical  limitations dating back to her stroke.  HEENT:  Negative.  HEMATOLOGIC:  Notable.  The patient has history of iron deficient  anemia.   PAST MEDICAL HISTORY:  1. Cerebrovascular disease, status post stroke.  2. Right middle cerebral artery stenosis.  3. Hypertension.  4. Type 2 diabetes mellitus.  5. Hyperlipidemia.  6. Mitral valve mass with mild mitral regurgitation.  7. Obstructive sleep apnea.  8. Iron-deficient anemia.   PAST SURGICAL HISTORY:  1. Tonsillectomy.  2. Partial hysterectomy with appendectomy.  3. ORIF of right ankle.  4. Back surgery x2 for lumbar abscess.  5. Breast biopsy.   FAMILY HISTORY:  Noncontributory.   SOCIAL HISTORY:  The patient is married and lives with her husband here  in [place].  She previously worked at the [employer], but she  is now disabled dating back to her stroke in June of this past year.  She has a history of tobacco abuse, although she quit smoking in  December.  Prior to that, she smoked approximately one half pack  cigarettes daily off and on for years.  She does not use alcohol.   CURRENT MEDICATIONS:  1. Glucotrol XL 10 mg daily.  2. Metformin 500 mg daily.  3. Lisinopril/hydrochlorothiazide 10/12.5 one tablet daily.  4. Aspirin 325 mg daily.  5. Pravastatin 40 mg daily.  6. Metoprolol 25 mg twice daily.  7. Warfarin 5 mg daily.  8. Metoclopramide 10 mg daily.  9. Tramadol 50 mg as needed.   DRUG ALLERGIES:  None known.   PHYSICAL EXAMINATION:  General:  The patient is a well-appearing  moderately obese [place] American female, who appears her stated age, in  no acute distress.  Vital Signs:  Blood pressure 123/83, pulse 68 and  regular, oxygen saturation 98% on room air.  HEENT:  Unrevealing.  Neck:  Supple.  There is no cervical nor supraclavicular lymphadenopathy.  Auscultation of the chest reveals clear breath sounds that are  symmetrical bilaterally.  No wheezes, rales or rhonchi noted.   Cardiovascular:  Notable for regular rate and rhythm.  No murmurs, rubs  or gallops are appreciated.  Abdomen:  Moderately obese, soft and  nontender.  Bowel sounds are present.  Extremities:  Warm and well-  perfused.  Femoral pulses are very diminished and difficult to palpate,  although the patient's body habitus may contribute to this.  Distal  pulses are not palpable.  Rectal and GU:  Both deferred.  Neurologic:  Notable for some mild weakness and numbness reported in the left arm and  left leg.   DIAGNOSTIC TEST:  Transesophageal echocardiogram performed June 23, 2010, is reviewed.  This demonstrates a fond-like mass that measures  close to 1-cm in its greatest dimension adherent to the atrial surface  of the middle scalp of the posterior leaflet of mitral valve.  The  appearance of this mass  is suggestive of a papillary fibroelastoma.  No  other abnormalities are noted.  There is mild mitral regurgitation.  The  remainder of the mitral valve leaflets appear normal and there is normal  leaflet motion.  There is nothing to suggest endocarditis or the  presence of a vegetation per se.  There is no calcification noted.  No  other abnormalities are noted.   IMPRESSION:  Benign-appearing mass adherent to the atrial surface of the  posterior leaflet of the mitral valve with appearance on transesophageal  echocardiogram consistent with likely papillary fibroelastoma.  The  patient suffered an embolic stroke in June [id number], and has had recurrent  transient ischemic attack symptoms recently.  The patient also has high-  grade stenosis of the right middle cerebral artery noted on MRA  performed in June of this year as well as repeat MRA in December.  The  patient's stroke was in the right middle cerebral artery distribution  and it is quite possible that the MCA stenosis is the source of her  events.  However, the patient also has mass on the atrial surface of the  mitral valve with  appearance consistent with fibroelastoma and the  possibility of an embolic event from this remains significant.  Presuming that she is stable from a medical standpoint, elective  surgical removal of this mass would probably be beneficial to decrease  her risk for future embolism.  Surgical intervention may come with some  increased risk of exacerbation of ischemic symptoms in the right middle  cerebral artery distribution due to the untreated right middle cerebral  artery stenosis.  The patient also is at significant risk for the  presence of underlying coronary artery disease, and she will certainly  require a cardiac catheterization.   PLAN:  I have discussed matters at length with the patient here in the  office today.  Unfortunately, no family members came with her for her  appointment today.  The rationale for considering surgical intervention  for removal of the mitral valve mass has been discussed.  She  understands that this will not in any way affect the presence of the  high-grade stenosis of the right middle cerebral artery and as such she  will certainly still potentially be at risk for further events related  to this particular issue.  Furthermore, going through open heart surgery  could exacerbate symptoms of ischemia in the right MCA distribution and  her risk of stroke maybe somewhat higher than it would be for patient's  without such well documented disease.  Nevertheless, I think it makes  sense to treat her well-documented mitral valve mass surgically.  There  is no question about its presence and persistence on multiple  transesophageal echocardiograms.  She remains on Coumadin at this time.  She is having frequent headaches, and I think it might make sense to  repeat her MRI of the brain prior to surgery.  I have discussed matters  over the telephone with Dr. [name].  We will plan to admit the patient  to the hospital early next week for intravenous heparin window  as she  comes off of Coumadin, so that she can undergo cardiac catheterization.  We will tentatively plan to proceed with surgery on Wednesday, January  25.  At the time of catheterization, we would specifically request that  imaging of her descending thoracic abdominal aorta and iliac vessels be  performed to evaluate the possibility of aortoiliac disease.  If she  does not have  significant coronary artery disease, we can consider use  of minimally invasive approach for treatment of her mitral valve mass.  She  also needs consultation with the dental service, and we will notify Dr.  Lawana Chambers.  All of her questions have been addressed.   Valentina Gu. [name], M.D.  Electronically Signed   CHO/MEDQ  D:  07/12/2010  T:  07/13/2010  Job:  [id number]   cc:   [name]. [name], MD, [place]. Leonie Man, MD  [name], M.D.  Dr. Shawn Stall

## 2010-11-10 ENCOUNTER — Encounter: Payer: Self-pay | Admitting: Cardiology

## 2010-11-10 ENCOUNTER — Ambulatory Visit (HOSPITAL_COMMUNITY): Payer: Medicaid Other

## 2010-11-11 ENCOUNTER — Ambulatory Visit (INDEPENDENT_AMBULATORY_CARE_PROVIDER_SITE_OTHER): Payer: Medicaid Other | Admitting: *Deleted

## 2010-11-11 DIAGNOSIS — I4891 Unspecified atrial fibrillation: Secondary | ICD-10-CM

## 2010-11-11 DIAGNOSIS — G459 Transient cerebral ischemic attack, unspecified: Secondary | ICD-10-CM

## 2010-11-11 DIAGNOSIS — Z7901 Long term (current) use of anticoagulants: Secondary | ICD-10-CM

## 2010-11-12 ENCOUNTER — Ambulatory Visit (HOSPITAL_COMMUNITY): Payer: Medicaid Other

## 2010-11-12 NOTE — Consult Note (Signed)
NAMEMONTGOMERY, BARTOO                ACCOUNT NO.:  [id number]   MEDICAL RECORD NO.:  [id number]          PATIENT TYPE:  INP   LOCATION:  [id number]                         FACILITY:  [facility]   PHYSICIAN:  Tory Emerald. Benson Norway, MD    DATE OF BIRTH:  [birth year]   DATE OF CONSULTATION:  03/30/2006  DATE OF DISCHARGE:                                   CONSULTATION   REASON FOR CONSULTATION:  Heme positive stool and anemia.   REFERRING PHYSICIAN:  Luane School, M.D.   HISTORY OF PRESENT ILLNESS:  This is a 60 year old female with a past  medical history of uncontrolled diabetes and history of noncompliance with  her medications who is admitted to the hospital with back pain.  The patient  states that her symptoms initially started 1 week prior to admission as a  small blister and then it progressively worsened over the intervening days.  She had fever at home and denied having any drainage at that time.  She  subsequently presented to the emergency room for further evaluation and  treatment.  The patient was admitted to the hospital and found to have a  large abscess in the right sacral region and upper buttocks and underwent a  surgical debridement on March 25, 2006, operatively.  Since that time,  the patient has been doing well and she has been treated with antibiotics  and was felt to have MRSA.  During the course of her hospitalization, her  hemoglobin was noted be trending down from a value of approximately 13, to  her current level in the 10 range.  The patient states having a history of  anemia, however, she does not know the exact values.  The patient also had a  rectal examination and was found to be heme-positive.  Subsequently, a GI  consult was requested for further evaluation.  Additionally, the patient  states having a family history of colon cancer.  She believes her mother  developed colon cancer in her late 37s and she subsequently died at the age  of 30.   PAST MEDICAL AND  SURGICAL HISTORY:  As stated above.  She is status post  hysterectomy.   FAMILY HISTORY:  Significant for the colon cancer in her mother.   SOCIAL HISTORY:  The patient is divorced.  Works as a Scientist, water quality in [employer].  She lives alone and has a daughter.  The patient was previously  a smoker and quit 8 years ago.   REVIEW OF SYSTEMS:  Unable to adequately obtain at this time as the patient  is medicated with pain medicines.   PHYSICAL EXAMINATION:  VITAL SIGNS:  Blood pressure is 178/96, heart rate is  66, respirations 18, temperature is 99.  GENERAL:  The patient is in no acute distress, but she is groggy.  HEENT:  Normocephalic, atraumatic.  Extraocular muscles intact.  Pupils  equal, round, reactive to light.  NECK:  Neck is supple.  No lymphadenopathy.  LUNGS:  Clear to auscultation bilaterally.  CARDIOVASCULAR:  Regular rhythm.  ABDOMEN:  Obese, soft, nontender, nondistended.  EXTREMITIES:  No  clubbing, cyanosis or edema.  The patient has an 8 cm  lateral incision in her upper right buttock which is packed with dressing at  this time and appears to be in intact.  No evidence of overt infection.   LABORATORY DATA AND X-RAY FINDINGS:  White blood cell count 7.9, hemoglobin  9.8, MCV is 79.1, platelets at 307.  Sodium 142, potassium 3.5, chloride is  113, CO2 24, glucose 62, BUN 5, creatinine is 1.3.   IMPRESSION:  1. Heme-positive stool.  2. Family history of colon cancer.  3. Status post incision and drainage of her abscess and lumbar abscess.  4. Diabetes mellitus.  5. Anemia.   RECOMMENDATIONS:  After evaluation of the patient, she does require a  colonoscopy, however, there is no emergent need for it.  Given her history  of noncompliance with diabetes and also the declining hemoglobin, it may be  wise to perform the procedure while she is in the hospital.  I do not feel  that there will be a significant risk of contamination of her wound site  during the prep.  At  this time, the patient would not like to undergo the  colonoscopy tomorrow, but would like to wait.  This will be adequate as this  will allow time to follow her hemoglobin and hematocrit over the intervening  days.  Her anemia is uncertain at this time, but I suspect is most likely  anemia of chronic disease, but further evaluation with iron panel and  ferritin will be performed to check for any evidence of iron-deficiency.  In  addition, her family history of colon cancer does not have any significant  bearing on her as her mother developed disease at a late age. She is still  at average risk for colon cancer, but her heme-positive stool needs to be  evaluated.  Plan is to place the patient for colonoscopy on Tuesday for  evaluation and possibly EGD depending on iron panel and ferritin.      Tory Emerald Benson Norway, MD  Electronically Signed     PDH/MEDQ  D:  03/30/2006  T:  04/01/2006  Job:  [id number]

## 2010-11-12 NOTE — Consult Note (Signed)
NAMECALIA, ARTIM                ACCOUNT NO.:  [id number]   MEDICAL RECORD NO.:  [id number]          PATIENT TYPE:  INP   LOCATION:  3028                         FACILITY:  [facility]   PHYSICIAN:  Adin Hector, MD     DATE OF BIRTH:  1950-07-01   DATE OF CONSULTATION:  09/30/2006  DATE OF DISCHARGE:                                 CONSULTATION   GENERAL SURGERY CONSULTATION:   REQUESTING PHYSICIAN:  Evette Doffing, M.D.   REASON FOR CONSULTATION:  Abscesses on back and right posterior flank  and supergluteal region.   HISTORY OF PRESENT ILLNESS:  Mrs. Rolena Infante is a 60 year old female  diabetic who has had difficulty with having control at home.  She has  had a history of prior skin infections in the past but has had about a  two-week history of swelling and boils with two areas becoming red and  hot and she came to be evaluated in the outpatient clinic for these  issues.  Out of basic concern she was admitted and placed on IV  antibiotics and request for surgical consultation was made for drainage.  She denies any traumas or falls.   PAST MEDICAL HISTORY:  1. Diabetes.  2. Hypertension.  3. History of lumbar abscess in 03/2006.  I do not know the culture      status.  4. Iron-deficiency anemia.  5. She also has acute renal failure in the past in 03/2006 and she had      a tubulovillous polyp noted on a colonoscopy in the past.   PAST SURGICAL HISTORY:  Incision and drainage of right posterior flank  lumbar abscess.   SOCIAL HISTORY:  She lives alone in [place] and is here with her  boyfriend.  About a 50-pack-year history of tobacco, but quit this week.  No alcohol or other IV drug use.   MEDICATIONS:  Include Advil, Glucotrol, ferrous sulfate, lisinopril and  hydrochlorothiazide.   ALLERGIES:  NO KNOWN DRUG ALLERGIES.   FAMILY HISTORY:  Negative for any major recurrent skin infections.  There is no one in the household with recurrent bad infections.  Her  mother  passed away from colon cancer, coronary artery disease but was a  heavy smoker, bronchitis as well.  Siblings:  She has 50 female and 1  female, one was heavy into alcohol and had cirrhosis.  She has three  children who are otherwise healthy.  Note:  She is divorced.   REVIEW OF SYSTEMS:  No fevers, chills, or sweats.  No change with weight  gain or weight loss.  Ophthalmologic, ENT, cardiovascular, respiratory,  GI, GU, dermatologic, endocrine, hematologic, lymph, musculoskeletal,  neurological, psych otherwise negative except for endocrine.  She has  poorly controlled diabetes.  Cardiovascular:  She has hypertension.   PHYSICAL EXAMINATION:  VITAL SIGNS:  Temperature 97.1, blood pressure  162/82, pulse 82, respirations 18, sats 100% sats.  GENERAL:  She is a well-developed, well-nourished, morbidly obese  female, not toxic or in any major distress.  EYES:  Pupils equal, round and reactive to light.  Extraocular movement  are  intact.  Sclerae are not icteric or injected.  HEENT:  Normocephalic with no facial asymmetry.  Mucous membranes are  moist.  She wears dentures.  Neck is supple without any masses.  HEART:  Regular rate and rhythm.  No murmurs, gallops or rubs.  CHEST:  Clear to auscultation bilaterally.  No wheezes, rales or  rhonchi.  ABDOMEN:  Soft, nontender and nondistended.  EXTREMITIES:  No significant clubbing or cyanosis, mild edema.  LYMPH:  No head, neck, axillary or groin lymphadenopathy.  MUSCULOSKELETAL:  She has full active range of motion shoulders, elbows,  wrists, hips, knees and ankles.  NEUROLOGICAL:  Cranial nerves II-XII are intact.  Hand grip is 5/5 equal  and symmetric with no resting or attention tremors.  PSYCHOLOGICAL:  No evidence of dementia, psychosis or paranoia.  SKIN:  She has numerous centimeter sized, raised lesions with some  central dried epidermolysis but no active drainage primarily involving  her right lateral and posterior flank, and on her  posterior back she has  about an 8 x 5 cm area of erythema and tenderness. She has another one  that is about 3 x 3 cm in size and is in the intergluteal fold just  superiorly on the right buttocks side.   LABORATORY DATA:  Her white count is 7.2.  Her hemoglobin A1c is 9.4%.  Glucose is 251.   ASSESSMENT AND PLAN:  [age]-year-old with history of skin  infections in the past with new skin infections suspicious for  folliculitis and poorly controlled diabetes.  1. Agree with aggressive diabetic control.  2. Hypertension, controlled.  3. Options discussed and informed consent was given for incision and      drainage of the abscesses.   PROCEDURE:  She was placed left side down.  Her right back in the area  was prepped and draped in sterile fashion.  Field block of 1% lidocaine  with epinephrine was given.  An about 4 cm incision was made over the  larger area and after some deep probing of about 2 cm deep we did get  into a pocket with some pus and some old wall cavity consistent with an  infected deep sebaceous cyst.  The rest of the tissue appears to be  viable.  A similar incision and drainage was done down the supergluteal  region and again that one was about 4 cm to a deeper cavity that  evacuated pus, although it was not nearly as large.  Wounds were packed  with some gauze, set up with an anesthetic as deep as possible using the  hemostat.  Dry dressing was applied.  Patient tolerated the procedure  well.   I agree with IV Vancomycin.  We will follow up on cultures.  We will  follow with you for some wound care and reconsider excision if her  cellulitis does not improve.      Adin Hector, MD  Electronically Signed     SCG/MEDQ  D:  09/29/2006  T:  10/01/2006  Job:  [id number]

## 2010-11-12 NOTE — Discharge Summary (Signed)
NAMEDEYA, BEADLE                ACCOUNT NO.:  [id number]   MEDICAL RECORD NO.:  [id number]          PATIENT TYPE:  INP   LOCATION:  3028                         FACILITY:  [facility]   PHYSICIAN:  Evette Doffing, M.D.  DATE OF BIRTH:  22-Jan-1951   DATE OF ADMISSION:  09/29/2006  DATE OF DISCHARGE:  10/04/2006                               DISCHARGE SUMMARY   DISCHARGE DIAGNOSES:  1. Abscess of the lower lumbar back and supragluteal crease,      methicillin-resistant Staphylococcus aureus positive and vancomycin      and doxycycline sensitive.  2. Diabetes mellitus type 2, poorly controlled.  3. Hypertension.  4. Vaginal yeast infection.  5. Anemia, normocytic.  6. History of recurrent skin infection and soft tissue abscesses, with      methicillin-resistant Staphylococcus aureus positive in the past.  7. Multiple colonic polyps status post colonoscopy in October2007,      consistent with tubular adenoma, tubulovillous adenoma with no high-      grade dysplasia or malignancy identified.  8. History of acute renal failure during last hospitalization in the      setting of vancomycin, Zosyn and metformin use as well as her acute      illness.   DISCHARGE MEDICATIONS:  1. Doxycycline 100 mg p.o. b.i.d. x14 days.  2. Glipizide 10 mg p.o. daily.  3. Hydrochlorothiazide 25 mg p.o. daily.  4. Lisinopril 10 mg p.o. b.i.d.  5. Iron sulfate 325 mg p.o. b.i.d.  6. Hydrocodone/APAP 5/325 1-2 tabs p.o. q.6 h. p.r.n. pain, with      instructions to take one pill 1 hour before dressing changes.  7. Colace 100 mg p.o. daily.   DISPOSITION AND FOLLOW UP:  Miss Rolena Infante is being discharged from [facility] in stable condition.  She is to follow up first with her  primary care physician, Dr. Cruzita Lederer, on October 17, 2006, at 9:15 a.m. in  the [facility].  At that point Dr. Cruzita Lederer should  assess her lumbar and supragluteal wounds to ensure continued  appropriate healing as  well as response to doxycycline.  Again, she is  being discharged on 2 weeks of antibiotics, at the time of Dr. Arman Filter  evaluation should she need prolonged treatment that can be decided on  then.  Also for Dr. Cruzita Lederer to consider would be a repeat trial of  metformin given this may afford the patient better diabetes control.  Of  note, she has a hemoglobin A1c of 9.4.   She is then to followup with Dr. Johney Maine, [facility] surgery on  April24,2008, at 10:00 a.m.  Dr. Johney Maine will assess the patient's  surgical incision sites as well as  appropriate wound healing.  Of note,  the patient is being discharged with home health RN for daily dressing  changes per surgery and wound care recommendations.   PROCEDURES PERFORMED:  On April5,2008, the patient underwent I&D of the  two abscesses as described below by Dr. Johney Maine.  She tolerated this well  without any complications.   CONSULTATIONS:  Dr. Michael Boston of  [facility].   BRIEF ADMISSION HISTORY AND PHYSICAL:  Miss Rolena Infante is a 60 year old  African-American woman with a history of poorly controlled diabetes,  hypertension, history of MRSA positive cellulitis and abscesses in the  past who presents to the outpatient clinic with complaints of multiple  boils and redness on her lower back and upper buttocks.  Apparently this  has been present for at least the last 2 weeks and the swelling and  redness has grown increasingly worse and painful to the patient.  She  feels that they have become hot and bothersome to her.  She reports this  is similar to her prior presentation earlier in the year when she was  admitted for a MRSA positive abscess.  For further details please see  the chart.   ADMISSION LABORATORIES AND VITALS:   PHYSICAL EXAMINATION:  VITAL SIGNS:  Temperature 97.1, blood pressure  162/92, pulse of 82, respiratory rate of 18, O2 saturations are 100 on  room air.  GENERAL:  This a normal-appearing middle-aged  woman in no apparent  distress.  HEENT:  Eyes:Pupils equally round and reactive to light.  Extraocular  muscles intact.  Mucous membranes were moist with no erythema or  exudate.  NECK:  Was supple, nontender with no JVD.  LUNGS:  Had good air entry bilaterally with no wheezes, rhonchi or  rales.  ABDOMEN:  Was soft, nontender, nondistended.  CARDIOVASCULAR:  She had a regular rate and rhythm with no murmurs, rubs  or gallops.  SKIN:  Examination revealed multiple boils with a large indurated area  approximately 8-10 cm long and 3-4 cm wide that was quite erythematous  and tender to palpation.  Just above the supragluteal fold there is  another large indurated area with a fluctuant mass just over the left  buttock crest.  Again tender to palpation throughout.  MUSCULOSKELETAL:  She had no joint abnormalities.  NEUROLOGICAL:  Cranial nerves II-XII were grossly intact.  She had  normal sensation throughout.  No focal deficits.  PSYCHIATRIC:  Her affect was full and she was appropriate.   ADMISSION LABORATORIES:  White blood cell count 7.2, hemoglobin 11.8,  platelets 259, MCV of 78, sodium 136, potassium 3.9, chloride 103,  bicarb 26, BUN 9, creatinine 0.6 and a glucose of 251, hemoglobin A1c  was 9.4%.  LFTs: Bilirubin 0.4, alk phos 58, AST 19, ALT 13, protein  6.8, albumin 3.2 and a calcium of 9.2.   HOSPITAL COURSE:  Problem 1. LOWER BACK AND SUPRAGLUTEAL ABSCESSES.  The  patient was admitted for IV antibiotics and surgical consultation was  obtained.  Dr. Johney Maine felt comfortable proceeding with I&D of the  lesions.  She tolerated this well and then underwent b.i.d. dressing  changes for several days.  For antibiotic choice she was empirically  treated with vancomycin until further culture data was available.  At  the time of discharge final culture data is available which revealed abundant methicillin-resistant Staphylococcus aureus resistant to  erythromycin and sensitive to  gentamicin, rifampin, Bactrim, vancomycin,  and tetracyclines.  Resistant to oxacillin, penicillin, and  erythromycin.  For further details please see E-Chart.  The patient's  wounds progressively continued to improve in terms of their appearance  and physical exam characteristics.  Again, we are discharging her with 2  weeks of oral antibiotic therapy in the form of b.i.d. doxycycline.  Should she need extended course of an antibiotics I will leave that at  the discretion of Dr. Johney Maine as well  as Dr. Cruzita Lederer.   Problem 2. DIABETES MELLITUS TYPE 2 POORLY CONTROLLED.  The patient has  had these infections in the setting of poorly controlled diabetes.  Apparently, during her last hospitalization, she had been on metformin  when she went into acute renal failure.  However, I think the patient  warrants another retrial of this.  At the time of discharge we will  leave to the discretion of Dr. Cruzita Lederer.  The patient was frequently  brought in food from outside, including [employer], and her blood  sugar was quite difficult to control in-house even on Lantus as well as  her oral hypoglycemics.  Ultimately this patient may require  insulin/lantus therapy.  We've asked that she see Barnabas Harries as well.   Problem 3. ANEMIA. APPARENTLY THIS IS A CHRONIC ISSUE WITH HER AND SHE  IS ON IRON REPLACEMENT THERAPY.  She has undergone colonoscopy as  described in the past medical history section which multiple polyps were  clipped.  Otherwise, her hemoglobin was stable and unchanged throughout  his hospitalization.   Problem 4. HYPERTENSION.  The patient was maintained on her home regimen  with adequate control of blood pressure.  We made no further changes to  this.   Problem 5. 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  We discussed this at length with the patient who is quite  concerned about having a recurrence of these.  This is understandable as  she has had at this  point two pretty major lesions on her back that have  been MRSA positive.  We recommend the patient obtain Hibiclens solution  and apply this as instructed on the bottle.  She is understanding of  this and will attempt to give this a trial.   Problem 6. CONSTIPATION.  The patient apparently deals chronically with  constipation however this been exacerbated in-house with opiate pain  medications.  I also wonder as an outpatient if iron has had a function  of constipation.  Regardless, we are discharging her with a stool  softener and should she still have complaints of constipation at her  followup certainly any other laxative including stimulant laxatives can  be tried.   DISCHARGE LABORATORIES AND VITALS ON THE DAY OF DISCHARGE:  Her  temperature is 98.4, blood pressure 115/72, pulse of 77, respiratory  rate of 20.  No labs were obtained on the day of discharge, however her last BMET revealed a sodium of 139, potassium 4.1, chloride of 105,  bicarb of 29, BUN 13, creatinine 0.92, glucose of 66.  Again, culture  data x2 revealed MRSA.  Her last CBC on April6,2008, revealed a white  blood cell count of 7.6, hemoglobin of 10.9 and MCV of 78 and platelet  count 257.      Katy Apo, M.D.  Electronically Signed      Evette Doffing, M.D.  Electronically Signed    GL/MEDQ  D:  10/04/2006  T:  10/04/2006  Job:  [id number]   cc:   Adin Hector, MD

## 2010-11-12 NOTE — Op Note (Signed)
NAMEKARNA, DOWNHOUR                ACCOUNT NO.:  [id number]   MEDICAL RECORD NO.:  [id number]          PATIENT TYPE:  INP   LOCATION:  [id number]                         FACILITY:  [facility]   PHYSICIAN:  Shellia Carwin, M.D. DATE OF BIRTH:  1950-09-06   DATE OF PROCEDURE:  03/25/2006  DATE OF DISCHARGE:                                 OPERATIVE REPORT   OPERATIVE PROCEDURE:  Incision, drainage, debridement of the large right  sacral-buttock abscess.   SURGEON:  Magdalene River, M.D.   ANESTHESIA:  General.   PREOPERATIVE DIAGNOSIS:  Abscess, right sacral region, low back, upper  buttock.   POSTOPERATIVE DIAGNOSIS:  Abscess, right sacral region, low back, upper  buttock.   CLINICAL SUMMARY:  A 60 year old female with an infected mass of her right  lower back.  She is an overweight diabetic.   OPERATIVE FINDINGS:  The patient had a large abscess with multiple  extensions in the deep subcu and did not extend into the muscle.  The entire  cavity ended up being approximately 8 x 5 inches.   OPERATIVE PROCEDURE:  Under satisfactory general endotracheal anesthesia,  the patient was positioned, prepped and draped in the standard fashion.  A  generous transverse incision made centered over the most fluctuant part of  the mass and I immediately encountered pus.  Then, using finger dissection  and exploration to determine the extent, I cut down and basically unroofed  the entire length of the abscess cavity and went deep to open any pockets.   When we were sure that there were no additional areas, the wound was  irrigated with saline, infiltrated with 30 cc of Marcaine for postop  analgesia and packed with multiple 4x4 gauzes moistened with Betadine.  A  dressing was applied.  She tolerated the procedure.  It is anticipated that  she will probably need repeat wound care in the operating room and then  progressed to outpatient therapy.  The sponge and needle counts were  correct.     ______________________________  Shellia Carwin, M.D.     MRL/MEDQ  D:  03/25/2006  T:  03/27/2006  Job:  [id number]

## 2010-11-12 NOTE — Consult Note (Signed)
[name], Greene                ACCOUNT NO.:  [id number]   MEDICAL RECORD NO.:  [id number]          PATIENT TYPE:  INP   LOCATION:  [id number]                         FACILITY:  [facility]   PHYSICIAN:  Sammuel Hines. Daiva Nakayama, M.D. DATE OF BIRTH:  1950/08/15   DATE OF CONSULTATION:  03/24/2006  DATE OF DISCHARGE:                                   CONSULTATION   HISTORY OF PRESENT ILLNESS:  Ms. [name] is a 60 year old black female with  poorly controlled diabetes who presents with a painful, swollen red area on  her right low back.  The area started about a week ago as a blister.  She  popped it with a pin and it then became more painful and swollen and tender  over the next several days.  She has been running low-grade fevers at home.  She has not had an drainage in the area.  She denies any chest pain,  shortness of breath, diarrhea, or dysuria.  The rest of review of systems  were unremarkable.   PAST MEDICAL HISTORY:  Her past medical history is significant for diabetes  and morbid obesity.   PAST SURGICAL HISTORY:  Past surgical history is significant for ORIF of the  ankle, hysterectomy.   MEDICATIONS:  Her medications include Aleve, and she was supposed to be on  diabetes medicines but stopped taking them.   ALLERGIES:  No know drug allergies.   SOCIAL HISTORY:  She denies any use of alcohol and tobacco products.   FAMILY HISTORY:  Family history is significant for colon cancer in her  mother, pneumonia in her father.   PHYSICAL EXAMINATION:  On physical exam, in general she is an obese, black  female in no acute distress.  Her skin is warm and dry.  No jaundice.  Eyes:  Her extraocular muscles are intact.  Pupils:  Equal, round, and  reactive to light.  Sclerae nonicteric.  Lungs are clear bilaterally with no use of accessory respiratory muscles.  Heart has a regular rate and rhythm with an impulse in the left chest.  Abdomen is soft and nontender with no palpable mass or  hepatosplenomegaly.  EXTREMITIES:  No cyanosis, clubbing, or edema.  Good strength in her arms  and legs.  Psychologically, she is alert and oriented x3 with no evidence of anxiety or  depression.  On examination of her right low back, she has a large, indurated red area  that is very painful.  White count is 12,000.   ASSESSMENT AND PLAN:  This is a 60 year old poorly controlled diabetic lady  with large abscess on the right low back.  I think this will need to be  opened and drained in the operating room.  I have explained to her in detail  the  risks and benefits of the operation to do this as well as some of the  technical aspects including leaving the wound open and needing dressing  changes until it heals up.  She understands and wishes to proceed.  She has  been fed dinner and therefore will have to wait until morning to  go to the  operating room.      Sammuel Hines. Daiva Nakayama, M.D.  Electronically Signed     PST/MEDQ  D:  03/24/2006  T:  03/25/2006  Job:  [id number]

## 2010-11-12 NOTE — Discharge Summary (Signed)
NAMEATIKA, ROGACKI                ACCOUNT NO.:  [id number]   MEDICAL RECORD NO.:  [id number]          PATIENT TYPE:  INP   LOCATION:  [id number]                         FACILITY:  [facility]   PHYSICIAN:  [name], M.D. DATE OF BIRTH:  [birth year]   DATE OF ADMISSION:  03/24/2006  DATE OF DISCHARGE:  04/07/2006                                 DISCHARGE SUMMARY   DISCHARGE DIAGNOSES:  1. Right lumbar abscess, drained.  2. Diabetes mellitus, type 2.  3. Hypertension.  4. Acute renal failure, resolved.  5. Iron deficiency anemia.  6. Tubulovillous polyps x2 plus tubular adenoma by colonoscopy.   DISCHARGE MEDICATIONS:  1. Hydrochlorothiazide 25 mg by mouth daily.  2. Lisinopril 10 mg by mouth 2 times daily.  3. Glipizide 10 mg by mouth daily.  4. Percocet 5/325 mg 1 tablet by mouth every 8 hours plus take 2 tablets      before dressing changes and skip the midday tablet on those days.  5. Ferrous sulfate 325 mg by mouth 2 times daily with meals.   The patient has an appointment with Dr. Rebekah Chesterfield at the [facility] on April 27, 2006 at 8:30 a.m. and with Dr. Cruzita Lederer in the  outpatient clinic on May 06, 2006 at 9:30 a.m.  The patient was  discharged with a vacuum pump and she will follow up with surgery.  Also,  the patient is to return to work when cleared by the Psychologist, sport and exercise.  The patient  was advised to follow carbohydrate moderate diet.  When she will be seen in  the outpatient clinic by Dr. Cruzita Lederer, she should have the BMET and CBC  checked along with blood pressure and glucose.   CONDITION ON DISCHARGE:  Improved.   PROCEDURE:  Mrs. Rolena Infante had a chest x-ray on admission that showed mild  linear atelectasis in the lingula, but no acute abnormality.  She had a  lumbar spine x-ray that showed normal alignment, no fracture or mass, mild  disk degeneration at L3-4 and L4-5 without significant spurring, but no  acute abnormality.  She also had a colonoscopy that  identified 3 polyps.  The first one in the sigmoid colon was a tubular adenoma, no high-grade  dysplasia or malignancy.  The second polyp in the descending colon showed a  tubulovillous adenoma, no high-grade dysplasia or malignancy.  The third one  in the ascending colon showed a tubulovillous adenoma, no high-grade  dysplasia or malignancy.   CONSULTATIONS:  1. Dr. Benson Norway with GI.  [place]. Toth with surgery.   HISTORY OF PRESENT ILLNESS:  The patient was admitted for lower back pain.  For full details, please refer to the patient's chart.  Briefly, Ms. Rolena Infante  is a 60 year old African American woman with a past medical history  significant for diabetes mellitus type 2, currently on no medication for the  last 3 years, presenting with complaints of pain, swelling, and redness in  the right lower back area that started as 2 small blisters along with pain 7  days prior to admission.  She self-punctured the blisters with  a pen, saw  clear discharge coming out, and has tried to rub it with alcohol and Vitamin  E ointment; however, the pain got worse and the swelling increased.  She has a history of left lumbar abscess more than 10 years ago.  She is  also morbidly obese.  She has a history of colon cancer in her mother.   PHYSICAL EXAMINATION:  VITAL SIGNS:  Temperature 99, blood pressure 155/86,  pulse 103 and decreased to 83, respiration rate 22, oxygen saturation 96% on  room air.  GENERAL:  The patient was in no acute distress; however, she was in pain.  She was sitting in bed comfortably.  HEENT:  Eyes:  PERRLA.  Extraocular muscles intact.  No icterus.  ENT:  Oropharynx was clear.  NECK:  Supple with no lymphadenopathy.  No JVD.  No thyromegaly.  RESPIRATIONS:  Clear to auscultation bilaterally.  No wheezes, rales, or  rhonchi; however, distant breath sounds.  CARDIOVASCULAR:  Regular rate and rhythm.  Possibly doubled S1 sounds.  No  murmurs or rubs.  GI:  The abdomen was  soft, nontender, and nondistended.  It was obese.  It  had positive bowel sounds.  She had a midline vertical scar status post  hysterectomy.  EXTREMITIES:  No cyanosis, clubbing, or edema.  SKIN:  On the skin, she had a 6-7 cm area of erythematous elevated region  with diffuse induration, tender to palpation, but with no open lesion, no  discharge, and no soft or fluctuant area noted.  She had 2 healed blisters  near the edge of this macula.  MENTAL STATUS:  She was alert and oriented x3.  NEURO EXAM:  Within normal limits.  PSYCH:  Appropriate.   LABORATORY DATA:  Sodium 135; potassium 4; chloride 99; bicarb 23; BUN 6;  creatinine 0.8; glucose 253; white blood count 12.6; hemoglobin 12.1;  hematocrit 39; thrombocytes 189,000; ANC 10.8; MCV 78.9; anion gap 13;  bilirubin 0.7; alk phos 67; SGOT 13; SGPT 9; protein 6.6; albumin 2.8;  calcium 8.7.  Urinalysis showed small white blood count of 3 to 6, 0 to 2  red blood cells, rare bacteria, negative nitrites, negative leukocyte  esterase, protein 30, glucose 500, microscopic no casts.  She had a PT of  12.6, INR 0.9, PTT 32, TSH 2.090, hemoglobin A1c 11.2%.  A fasting lipid  profile showed cholesterol of 160, triglycerides 94, HDL 40, and LDL 101.   ASSESSMENT/PLAN:  1. Right lumbar abscess.  The patient had a large area that was      erythematous, indurated, and tender to palpation with blisters that      proved to be a deep infection (abscess).  We have treated empirically      with vancomycin and Zosyn to cover for possible methicillin-resistant      Staphylococcus aureus.  We have continued the treatment with vancomycin      for a total of 13 days. The abscess was incised and drained on      March 25, 2006. With antibiotics and IND, the patient started to      feel better, was afebrile and the white blood count started to      decrease.  The patient continued to feel pain during wound dressings     and that was controlled with  Dilaudid at first and then with Percocet      p.o. Wound care and surgery have followed her all along her hospital      stay.  Towards the end of the stay, she was placed on a vacuum pump.      Before discharge, the patient was feeling well and her pain was well      controlled on Percocet.  She was discharged home without any      antibiotics; however, she will receive vacuum treatments at home for at      least 2 months after discharge.  2. Diabetes mellitus, type 2.  The patient has been on no treatment for      the last 3 months before admission.  Her hemoglobin A1c was 11.2.  We      started her on sliding scale insulin and Lantus until after the      surgery, when we re-started metformin.  However, after we re-started      metformin, her creatinine increased from 0.8 to 1.9 and at that point,      we discontinued the metformin.  We also checked a microalbumin to      creatinine ratio and that was 16.6, so less than 30; however, we have      started the patient on an ACE inhibitor at home to reno-protect her      from the diabetes consequences.  3. Hypertension.  The patient continued to have high blood pressure after      the admission from 155/86 to 198/92.  We have started her on      hydrochlorothiazide 12.5 mg a day and then we increased it to 25 mg a      day.  Also, before discharge, we have started her on lisinopril 10 mg      b.i.d.  She is to follow up in the clinic with Dr. Cruzita Lederer, who will      adjust this treatment according to her blood pressure.  4. Anemia.  Her hemoglobin decreased to the lowest value of 9.5 during      this hospital stay. Her iron was 21.  TIBC was 245 and percent      saturation 11.  We concluded that this might be iron deficiency anemia,      but also anemia of chronic disease; however, we have consulted GI and      the patient had a colonoscopy that found 3 colonic polyps.  We have      started her on iron medication b.i.d. and Dr. Cruzita Lederer will  check a CBC      at the patient's clinic appointment in November.  5. Acute renal failure, resolved.  Please see problem number 2 + The      increase in creatinine to 1.9 might have been caused Zosyn (along with      Metformin and likely along with a baseline abnormality induced by      diabetes), therefore at that point we stopped the Zosyn and observed      her for several days to see if her infection would be controlled by      vancomycin alone.  She did not develop any fever, increased white blood      count, or increased pain, so we have maintained her only on the      vancomycin until the end of her hospital stay.  Also towards the end of      the hospital stay, the creatinine has started to decrease.  The day of      discharge, it was 1.1.   DISCHARGE LABORATORY DATA:  Maximum temperature 99; systolic blood pressure  168/88; pulse 69; respiration rate 20; oxygen saturation 98% on room air; white blood count 6.1; hemoglobin 10.4; hematocrit 31.8; thrombocytes 396;  sodium 139; potassium 3.6; chloride 104; bicarb 29; BUN 10; creatinine 1.1;  glucose 200; calcium 9.1.      [name], M.D.  Electronically Signed     CG/MEDQ  D:  04/17/2006  T:  04/18/2006  Job:  [id number]   cc:   Shellia Carwin, M.D.  Tory Emerald Benson Norway, [place]. Daiva Nakayama, M.D.

## 2010-11-15 ENCOUNTER — Ambulatory Visit (HOSPITAL_COMMUNITY): Payer: Medicaid Other

## 2010-11-16 ENCOUNTER — Encounter: Payer: Self-pay | Admitting: Cardiology

## 2010-11-16 ENCOUNTER — Ambulatory Visit (INDEPENDENT_AMBULATORY_CARE_PROVIDER_SITE_OTHER): Payer: Medicaid Other | Admitting: Cardiology

## 2010-11-16 DIAGNOSIS — I059 Rheumatic mitral valve disease, unspecified: Secondary | ICD-10-CM

## 2010-11-16 DIAGNOSIS — Z9889 Other specified postprocedural states: Secondary | ICD-10-CM | POA: Insufficient documentation

## 2010-11-16 DIAGNOSIS — E785 Hyperlipidemia, unspecified: Secondary | ICD-10-CM

## 2010-11-16 DIAGNOSIS — I679 Cerebrovascular disease, unspecified: Secondary | ICD-10-CM | POA: Insufficient documentation

## 2010-11-16 DIAGNOSIS — I2510 Atherosclerotic heart disease of native coronary artery without angina pectoris: Secondary | ICD-10-CM

## 2010-11-16 LAB — LIPID PANEL
HDL: 73.7 mg/dL (ref >39.00)
LDL Cholesterol: 65 mg/dL (ref 0–99)
Total CHOL/HDL Ratio: 2
Triglycerides: 49 mg/dL (ref 0.0–149.0)

## 2010-11-16 LAB — HEPATIC FUNCTION PANEL
AST: 31 U/L (ref 0–37)
Albumin: 3.5 g/dL (ref 3.5–5.2)
Alkaline Phosphatase: 44 U/L (ref 39–117)
Total Bilirubin: 0.2 mg/dL — ABNORMAL LOW (ref 0.3–1.2)

## 2010-11-16 LAB — BASIC METABOLIC PANEL
CO2: 27 mEq/L (ref 19–32)
Chloride: 109 mEq/L (ref 96–112)
Glucose, Bld: 142 mg/dL — ABNORMAL HIGH (ref 70–99)
Sodium: 140 mEq/L (ref 135–145)

## 2010-11-16 MED ORDER — ASPIRIN EC 81 MG PO TBEC: 81.0000 mg | DELAYED_RELEASE_TABLET | Freq: Every day | ORAL | Status: AC

## 2010-11-16 NOTE — Assessment & Plan Note (Signed)
Continue Coumadin. 

## 2010-11-16 NOTE — Assessment & Plan Note (Signed)
Blood pressure controlled. Continue present medications. Check potassium and renal function. 

## 2010-11-16 NOTE — Assessment & Plan Note (Signed)
Previously documented right MCA stenosis. Continue Coumadin but will need to review with neurology long-term needs. 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 This has now been resected.

## 2010-11-16 NOTE — Patient Instructions (Signed)
Your physician recommends that you schedule a follow-up appointment in: 6 months with Dr. [name] Your physician has recommended you make the following change in your medication: Decrease aspirin to 81 mg daily Your physician recommends that you have lab work in: today Your physician has requested that you have an echocardiogram. Echocardiography is a painless test that uses sound waves to create images of your heart. It provides your doctor with information about the size and shape of your heart and how well your heart's chambers and valves are working. This procedure takes approximately one hour. There are no restrictions for this procedure.

## 2010-11-16 NOTE — Assessment & Plan Note (Signed)
Continued SBE prophylaxis. Schedule baseline echocardiogram status post repair.

## 2010-11-16 NOTE — Assessment & Plan Note (Signed)
Continue aspirin and statin. 
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
## 2010-11-17 ENCOUNTER — Ambulatory Visit (HOSPITAL_COMMUNITY): Payer: Medicaid Other

## 2010-11-19 ENCOUNTER — Ambulatory Visit (HOSPITAL_COMMUNITY): Payer: Medicaid Other

## 2010-11-22 ENCOUNTER — Ambulatory Visit (HOSPITAL_COMMUNITY): Payer: Medicaid Other

## 2010-11-24 ENCOUNTER — Ambulatory Visit (HOSPITAL_COMMUNITY): Payer: Medicaid Other

## 2010-11-25 ENCOUNTER — Ambulatory Visit (INDEPENDENT_AMBULATORY_CARE_PROVIDER_SITE_OTHER): Payer: Medicaid Other | Admitting: *Deleted

## 2010-11-25 ENCOUNTER — Ambulatory Visit (HOSPITAL_COMMUNITY): Payer: Medicaid Other | Attending: Cardiology

## 2010-11-25 DIAGNOSIS — E785 Hyperlipidemia, unspecified: Secondary | ICD-10-CM | POA: Insufficient documentation

## 2010-11-25 DIAGNOSIS — G459 Transient cerebral ischemic attack, unspecified: Secondary | ICD-10-CM

## 2010-11-25 DIAGNOSIS — E119 Type 2 diabetes mellitus without complications: Secondary | ICD-10-CM | POA: Insufficient documentation

## 2010-11-25 DIAGNOSIS — I4891 Unspecified atrial fibrillation: Secondary | ICD-10-CM

## 2010-11-25 DIAGNOSIS — I319 Disease of pericardium, unspecified: Secondary | ICD-10-CM | POA: Insufficient documentation

## 2010-11-25 DIAGNOSIS — Z7901 Long term (current) use of anticoagulants: Secondary | ICD-10-CM

## 2010-11-25 DIAGNOSIS — I079 Rheumatic tricuspid valve disease, unspecified: Secondary | ICD-10-CM | POA: Insufficient documentation

## 2010-11-25 DIAGNOSIS — I059 Rheumatic mitral valve disease, unspecified: Secondary | ICD-10-CM | POA: Insufficient documentation

## 2010-11-25 DIAGNOSIS — I10 Essential (primary) hypertension: Secondary | ICD-10-CM | POA: Insufficient documentation

## 2010-11-25 DIAGNOSIS — I379 Nonrheumatic pulmonary valve disorder, unspecified: Secondary | ICD-10-CM | POA: Insufficient documentation

## 2010-11-25 MED ORDER — WARFARIN SODIUM 5 MG PO TABS: 5.0000 mg | ORAL_TABLET | ORAL | Status: DC

## 2010-11-26 ENCOUNTER — Ambulatory Visit (HOSPITAL_COMMUNITY): Payer: Medicaid Other

## 2010-12-09 ENCOUNTER — Ambulatory Visit (INDEPENDENT_AMBULATORY_CARE_PROVIDER_SITE_OTHER): Payer: Medicaid Other | Admitting: *Deleted

## 2010-12-09 DIAGNOSIS — G459 Transient cerebral ischemic attack, unspecified: Secondary | ICD-10-CM

## 2010-12-09 DIAGNOSIS — Z7901 Long term (current) use of anticoagulants: Secondary | ICD-10-CM

## 2010-12-09 DIAGNOSIS — I4891 Unspecified atrial fibrillation: Secondary | ICD-10-CM

## 2010-12-09 LAB — POCT INR: INR: 2.3

## 2010-12-23 ENCOUNTER — Ambulatory Visit (INDEPENDENT_AMBULATORY_CARE_PROVIDER_SITE_OTHER): Payer: Medicaid Other | Admitting: *Deleted

## 2010-12-23 DIAGNOSIS — I4891 Unspecified atrial fibrillation: Secondary | ICD-10-CM

## 2010-12-23 DIAGNOSIS — G459 Transient cerebral ischemic attack, unspecified: Secondary | ICD-10-CM

## 2010-12-23 DIAGNOSIS — Z7901 Long term (current) use of anticoagulants: Secondary | ICD-10-CM

## 2010-12-23 LAB — POCT INR: INR: 1.8

## 2011-01-06 ENCOUNTER — Ambulatory Visit (INDEPENDENT_AMBULATORY_CARE_PROVIDER_SITE_OTHER): Payer: Medicaid Other | Admitting: *Deleted

## 2011-01-06 DIAGNOSIS — I4891 Unspecified atrial fibrillation: Secondary | ICD-10-CM

## 2011-01-06 DIAGNOSIS — G459 Transient cerebral ischemic attack, unspecified: Secondary | ICD-10-CM

## 2011-01-06 DIAGNOSIS — Z7901 Long term (current) use of anticoagulants: Secondary | ICD-10-CM

## 2011-01-26 ENCOUNTER — Telehealth: Payer: Self-pay | Admitting: Cardiovascular Disease

## 2011-01-26 ENCOUNTER — Other Ambulatory Visit: Payer: Self-pay | Admitting: Pharmacist

## 2011-01-26 MED ORDER — WARFARIN SODIUM 5 MG PO TABS: ORAL_TABLET | ORAL | Status: DC

## 2011-01-26 NOTE — Telephone Encounter (Signed)
Spoke with [name] at [facility] and verified pt dose of Warfarin.

## 2011-01-26 NOTE — Telephone Encounter (Signed)
Clarification of warfarin.

## 2011-01-28 ENCOUNTER — Ambulatory Visit (INDEPENDENT_AMBULATORY_CARE_PROVIDER_SITE_OTHER): Payer: Medicaid Other | Admitting: *Deleted

## 2011-01-28 DIAGNOSIS — G459 Transient cerebral ischemic attack, unspecified: Secondary | ICD-10-CM

## 2011-01-28 DIAGNOSIS — Z7901 Long term (current) use of anticoagulants: Secondary | ICD-10-CM

## 2011-01-28 DIAGNOSIS — I4891 Unspecified atrial fibrillation: Secondary | ICD-10-CM

## 2011-01-28 LAB — POCT INR: INR: 1

## 2011-02-11 ENCOUNTER — Encounter: Payer: Medicaid Other | Admitting: *Deleted

## 2011-02-18 ENCOUNTER — Ambulatory Visit (INDEPENDENT_AMBULATORY_CARE_PROVIDER_SITE_OTHER): Payer: Medicaid Other | Admitting: *Deleted

## 2011-02-18 DIAGNOSIS — I4891 Unspecified atrial fibrillation: Secondary | ICD-10-CM

## 2011-02-18 DIAGNOSIS — Z7901 Long term (current) use of anticoagulants: Secondary | ICD-10-CM

## 2011-02-18 DIAGNOSIS — G459 Transient cerebral ischemic attack, unspecified: Secondary | ICD-10-CM

## 2011-02-18 LAB — POCT INR: INR: 2.3

## 2011-03-11 ENCOUNTER — Encounter: Payer: Medicaid Other | Admitting: *Deleted

## 2011-03-18 ENCOUNTER — Ambulatory Visit (INDEPENDENT_AMBULATORY_CARE_PROVIDER_SITE_OTHER): Payer: Medicaid Other | Admitting: *Deleted

## 2011-03-18 DIAGNOSIS — G459 Transient cerebral ischemic attack, unspecified: Secondary | ICD-10-CM

## 2011-03-18 DIAGNOSIS — Z7901 Long term (current) use of anticoagulants: Secondary | ICD-10-CM

## 2011-03-18 DIAGNOSIS — I4891 Unspecified atrial fibrillation: Secondary | ICD-10-CM

## 2011-03-18 LAB — POCT INR: INR: 2.1

## 2011-04-15 ENCOUNTER — Ambulatory Visit (INDEPENDENT_AMBULATORY_CARE_PROVIDER_SITE_OTHER): Payer: Medicaid Other | Admitting: *Deleted

## 2011-04-15 DIAGNOSIS — Z7901 Long term (current) use of anticoagulants: Secondary | ICD-10-CM

## 2011-04-15 DIAGNOSIS — I4891 Unspecified atrial fibrillation: Secondary | ICD-10-CM

## 2011-04-15 DIAGNOSIS — G459 Transient cerebral ischemic attack, unspecified: Secondary | ICD-10-CM

## 2011-05-02 ENCOUNTER — Encounter (HOSPITAL_COMMUNITY): Payer: Self-pay | Admitting: Emergency Medicine

## 2011-05-02 ENCOUNTER — Emergency Department (HOSPITAL_COMMUNITY)
Admission: EM | Admit: 2011-05-02 | Discharge: 2011-05-02 | Disposition: A | Discharge status: Home / Self Care | Payer: Medicaid Other | Attending: Emergency Medicine | Admitting: Emergency Medicine

## 2011-05-02 DIAGNOSIS — I059 Rheumatic mitral valve disease, unspecified: Secondary | ICD-10-CM | POA: Insufficient documentation

## 2011-05-02 DIAGNOSIS — L03031 Cellulitis of right toe: Secondary | ICD-10-CM

## 2011-05-02 DIAGNOSIS — E119 Type 2 diabetes mellitus without complications: Secondary | ICD-10-CM | POA: Insufficient documentation

## 2011-05-02 DIAGNOSIS — Z8673 Personal history of transient ischemic attack (TIA), and cerebral infarction without residual deficits: Secondary | ICD-10-CM | POA: Insufficient documentation

## 2011-05-02 DIAGNOSIS — I2510 Atherosclerotic heart disease of native coronary artery without angina pectoris: Secondary | ICD-10-CM | POA: Insufficient documentation

## 2011-05-02 DIAGNOSIS — M79609 Pain in unspecified limb: Secondary | ICD-10-CM | POA: Insufficient documentation

## 2011-05-02 DIAGNOSIS — I10 Essential (primary) hypertension: Secondary | ICD-10-CM | POA: Insufficient documentation

## 2011-05-02 DIAGNOSIS — Z9889 Other specified postprocedural states: Secondary | ICD-10-CM | POA: Insufficient documentation

## 2011-05-02 DIAGNOSIS — E785 Hyperlipidemia, unspecified: Secondary | ICD-10-CM | POA: Insufficient documentation

## 2011-05-02 DIAGNOSIS — L03039 Cellulitis of unspecified toe: Secondary | ICD-10-CM | POA: Insufficient documentation

## 2011-05-02 DIAGNOSIS — Z951 Presence of aortocoronary bypass graft: Secondary | ICD-10-CM | POA: Insufficient documentation

## 2011-05-02 MED ORDER — CEPHALEXIN 250 MG PO CAPS: 500.0000 mg | ORAL_CAPSULE | Freq: Three times a day (TID) | ORAL | Status: DC

## 2011-05-02 MED ORDER — CEPHALEXIN 500 MG PO CAPS: 500.0000 mg | ORAL_CAPSULE | Freq: Three times a day (TID) | ORAL | Status: AC

## 2011-05-02 NOTE — ED Notes (Signed)
Pt c/o left great toe pain after filing down toenail at little too far several weeks ago; pt sts painful and not healing; pt diabetic
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
## 2011-05-02 NOTE — ED Notes (Signed)
C/o left great toe [pain times three weeks, worse today rated 10/10 when touched.  No pain meds taken .

## 2011-05-05 NOTE — ED Provider Notes (Signed)
Medical screening examination/treatment/procedure(s) were performed by non-physician practitioner and as supervising physician I was immediately available for consultation/collaboration. Rolland Porter, MD, Abram Sander   Janice Norrie, MD 05/05/11 1022

## 2011-05-27 ENCOUNTER — Encounter: Payer: Self-pay | Admitting: Cardiology

## 2011-05-27 ENCOUNTER — Ambulatory Visit: Payer: Medicaid Other | Admitting: Cardiology

## 2011-05-27 ENCOUNTER — Ambulatory Visit (INDEPENDENT_AMBULATORY_CARE_PROVIDER_SITE_OTHER): Payer: Medicaid Other | Admitting: Cardiology

## 2011-05-27 ENCOUNTER — Telehealth: Payer: Self-pay | Admitting: Cardiology

## 2011-05-27 ENCOUNTER — Ambulatory Visit (INDEPENDENT_AMBULATORY_CARE_PROVIDER_SITE_OTHER): Payer: Medicaid Other | Admitting: *Deleted

## 2011-05-27 DIAGNOSIS — Z7901 Long term (current) use of anticoagulants: Secondary | ICD-10-CM

## 2011-05-27 DIAGNOSIS — I4891 Unspecified atrial fibrillation: Secondary | ICD-10-CM

## 2011-05-27 DIAGNOSIS — I10 Essential (primary) hypertension: Secondary | ICD-10-CM

## 2011-05-27 DIAGNOSIS — Z79899 Other long term (current) drug therapy: Secondary | ICD-10-CM

## 2011-05-27 DIAGNOSIS — G459 Transient cerebral ischemic attack, unspecified: Secondary | ICD-10-CM

## 2011-05-27 DIAGNOSIS — E7800 Pure hypercholesterolemia, unspecified: Secondary | ICD-10-CM

## 2011-05-27 LAB — POCT INR: INR: 2.7

## 2011-05-27 MED ORDER — LOSARTAN POTASSIUM 50 MG PO TABS: 50.0000 mg | ORAL_TABLET | Freq: Every day | ORAL | Status: DC

## 2011-05-27 MED ORDER — PRAVASTATIN SODIUM 40 MG PO TABS: 40.0000 mg | ORAL_TABLET | Freq: Every evening | ORAL | Status: DC

## 2011-05-27 MED ORDER — METOPROLOL TARTRATE 25 MG PO TABS: 25.0000 mg | ORAL_TABLET | Freq: Two times a day (BID) | ORAL | Status: DC

## 2011-05-27 NOTE — Assessment & Plan Note (Signed)
Patient not on aspirin as she is on Coumadin. Resume statin.

## 2011-05-27 NOTE — Assessment & Plan Note (Signed)
Patient discontinued Lipitor because of expense. Begin Pravachol 40 mg daily. Check lipids and liver in 6 weeks. Given Coumadin use we will also check CBC. Will also check potassium and renal function given ARB use.

## 2011-05-27 NOTE — Progress Notes (Signed)
[id number] [name] is a pleasant female with a h/o of mitral valve mass and previous CVA/TIAs. Patient had a CVA in June of 2011. 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 She was treated with Coumadin. She was readmitted in December 2011 with possible TIA symptoms; she had not been taking coumadin. She was referred to surgery. She was brought in to the hospital 1/24 for a pre-operative cardiac cath that demonstrated 2v CAD with D2 70% and totally occluded dRCA before the PDA. She then underwent a mitral valve mass resection with MV repair and 2v CABG with S-D2 and S-PDA in Jan 2012. 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 Repeat echocardiogram in May of 2012 showed normal LV function, grade 2 diastolic dysfunction, status post mitral valve repair with mild mitral regurgitation and a trivial pericardial effusion. I last saw her in May of 2012. Since then, the patient denies any dyspnea on exertion, orthopnea, PND, pedal edema, palpitations, syncope or chest pain. Patient has had a cough.    Current Outpatient Prescriptions  Medication Sig Dispense Refill  . aspirin EC 81 MG tablet Take 1 tablet (81 mg total) by mouth daily.  30 tablet  2  . glipiZIDE (GLUCOTROL) 10 MG tablet Take 10 mg by mouth daily.       [name] lisinopril-hydrochlorothiazide (PRINZIDE,ZESTORETIC) 10-12.5 MG per tablet Take 1 tablet by mouth daily.       . metFORMIN (GLUCOPHAGE) 500 MG tablet Take 500 mg by mouth daily.       . metoCLOPramide (REGLAN) 10 MG tablet Take 10 mg by mouth 3 (three) times daily before meals.       . metoprolol tartrate (LOPRESSOR) 25 MG tablet Take 25 mg by mouth daily.       . traMADol (ULTRAM) 50 MG tablet Take 50 mg by mouth 3 (three) times daily as needed. For pain      . warfarin (COUMADIN) 5 MG tablet Take 5 mg by mouth daily. Take as directed by Anticoagulation clinic Takes 2 tabs on Monday and Friday then 2.5 tab all other days          Past  Medical History  Diagnosis Date  . Coronary artery disease     s/p CABG 06/2010: S-D2; S-PDA  . 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 [name]  . Hypertension     essential, NOS  . Hyperlipidemia   . Hx of transient ischemic attack (TIA)   . Anemia, iron deficiency   . Diabetes mellitus     type II  . Stenosis of middle cerebral artery   . Abscess in epidural space of L2-L5 lumbar spine 03/2006    Past Surgical History  Procedure Date  . Vesicovaginal fistula closure w/ tah   . History of ankle fractures requiring surgery   . Mv repair and resection of mass 1/12  . Coronary artery bypass graft 1/12    History   Social History  . Marital Status: Married    Spouse Name: N/A    Number of Children: N/A  . Years of Education: N/A   Occupational History  . Not on file.   Social History Main Topics  . Smoking status: Former Research scientist (life sciences)  . Smokeless tobacco: Not on file  . Alcohol Use: Not on file  . Drug Use: Not on file  . Sexually Active: Not on file   Other Topics Concern  .  Not on file   Social History Narrative  . No narrative on file    ROS: no fevers or chills, productive cough, hemoptysis, dysphasia, odynophagia, melena, hematochezia, dysuria, hematuria, rash, seizure activity, orthopnea, PND, pedal edema, claudication. Remaining systems are negative.  Physical Exam: Well-developed well-nourished in no acute distress.  Skin is warm and dry.  HEENT is normal.  Neck is supple. No thyromegaly.  Chest is clear to auscultation with normal expansion. Status post sternotomy. Cardiovascular exam is regular rate and rhythm. 2/6 systolic ejection murmur Abdominal exam nontender or distended. No masses palpated. Extremities show no edema. neuro grossly intact  ECG NSR with no ST changes

## 2011-05-27 NOTE — Assessment & Plan Note (Signed)
Blood pressure is elevated. She is also complaining of cough which may be related to ACE inhibitor. Discontinue lisinopril HCT and begin Cozaar 50 mg daily. Increase metoprolol to 25 mg b.i.d. Further adjustments based on followup readings.

## 2011-05-27 NOTE — Assessment & Plan Note (Signed)
Continued SBE prophylaxis.

## 2011-05-27 NOTE — Assessment & Plan Note (Signed)
Patient with history of TIAs felt possibly secondary to distal right MCA stenosis. She has been on Coumadin for this. She also had a mitral valve mass that we felt could be contributing to previous TIAs. I have asked her to followup with neurology. If her previous TIAs were clearly related to right MCA stenosis we will continue Coumadin. If more likely related to previous mitral valve mass which has now been resected we could discontinue Coumadin and instead treat with aspirin. I would like neurology input concerning this issue.

## 2011-05-27 NOTE — Patient Instructions (Signed)
Your physician wants you to follow-up in: one year You will receive a reminder letter in the mail two months in advance. If you don't receive a letter, please call our office to schedule the follow-up appointment.   STOP LISINOPRIL/HCT  START LOSARTAN 50 MG ONCE DAILY  INCREASE METOPROLOL TART TO 25 MG TWICE DAILY  START PRAVASTATIN 40 MG ONCE DAILY AT BEDTIME  Your physician recommends that you return for lab work in: [place] TO CONT COUMADIN FOR RIGHT MCA STENOSIS

## 2011-07-08 ENCOUNTER — Other Ambulatory Visit: Payer: Medicaid Other | Admitting: *Deleted

## 2011-07-13 ENCOUNTER — Other Ambulatory Visit: Payer: Self-pay | Admitting: Pharmacist

## 2011-07-13 MED ORDER — WARFARIN SODIUM 5 MG PO TABS: ORAL_TABLET | ORAL | Status: DC

## 2011-07-13 NOTE — Telephone Encounter (Signed)
Patient called and states she is out of her coumadin, she has appointment on Friday with clinic and [facility] lab

## 2011-07-15 ENCOUNTER — Other Ambulatory Visit: Payer: Medicaid Other | Admitting: *Deleted

## 2011-07-15 ENCOUNTER — Encounter: Payer: Medicaid Other | Admitting: *Deleted

## 2011-07-18 ENCOUNTER — Ambulatory Visit (INDEPENDENT_AMBULATORY_CARE_PROVIDER_SITE_OTHER): Payer: Medicaid Other | Admitting: *Deleted

## 2011-07-18 ENCOUNTER — Other Ambulatory Visit (INDEPENDENT_AMBULATORY_CARE_PROVIDER_SITE_OTHER): Payer: Medicaid Other | Admitting: *Deleted

## 2011-07-18 DIAGNOSIS — E7800 Pure hypercholesterolemia, unspecified: Secondary | ICD-10-CM

## 2011-07-18 DIAGNOSIS — I4891 Unspecified atrial fibrillation: Secondary | ICD-10-CM

## 2011-07-18 DIAGNOSIS — I10 Essential (primary) hypertension: Secondary | ICD-10-CM

## 2011-07-18 DIAGNOSIS — Z7901 Long term (current) use of anticoagulants: Secondary | ICD-10-CM

## 2011-07-18 DIAGNOSIS — Z79899 Other long term (current) drug therapy: Secondary | ICD-10-CM

## 2011-07-18 DIAGNOSIS — G459 Transient cerebral ischemic attack, unspecified: Secondary | ICD-10-CM

## 2011-07-18 LAB — CBC WITH DIFFERENTIAL/PLATELET
Basophils Absolute: 0.1 10*3/uL (ref 0.0–0.1)
Basophils Relative: 0.9 % (ref 0.0–3.0)
Eosinophils Absolute: 0.1 10*3/uL (ref 0.0–0.7)
Lymphocytes Relative: 53.3 % — ABNORMAL HIGH (ref 12.0–46.0)
MCHC: 32.5 g/dL (ref 30.0–36.0)
MCV: 83.7 fl (ref 78.0–100.0)
Monocytes Absolute: 0.3 10*3/uL (ref 0.1–1.0)
Neutro Abs: 2.2 10*3/uL (ref 1.4–7.7)
Neutrophils Relative %: 38.6 % — ABNORMAL LOW (ref 43.0–77.0)
RBC: 4 Mil/uL (ref 3.87–5.11)
RDW: 15.7 % — ABNORMAL HIGH (ref 11.5–14.6)

## 2011-07-18 LAB — POCT INR: INR: 2.9

## 2011-07-18 LAB — BASIC METABOLIC PANEL
BUN: 22 mg/dL (ref 6–23)
Creatinine, Ser: 1 mg/dL (ref 0.4–1.2)
GFR: 69.03 mL/min (ref >60.00)
Potassium: 4.3 mEq/L (ref 3.5–5.1)

## 2011-07-18 LAB — LIPID PANEL
Cholesterol: 216 mg/dL — ABNORMAL HIGH (ref 0–200)
Total CHOL/HDL Ratio: 3
Triglycerides: 89 mg/dL (ref 0.0–149.0)
VLDL: 17.8 mg/dL (ref 0.0–40.0)

## 2011-07-18 LAB — LDL CHOLESTEROL, DIRECT: Direct LDL: 101.7 mg/dL

## 2011-07-18 LAB — HEPATIC FUNCTION PANEL
Bilirubin, Direct: 0 mg/dL (ref 0.0–0.3)
Total Bilirubin: 0.3 mg/dL (ref 0.3–1.2)

## 2011-07-19 ENCOUNTER — Telehealth: Payer: Self-pay | Admitting: Cardiology

## 2011-07-19 NOTE — Telephone Encounter (Signed)
Spoke with pt, aware of labs and need for follow up with PCP. She is currently looking for one

## 2011-07-19 NOTE — Telephone Encounter (Signed)
FU Call: Pt returning call from our office to get pt lab results. Please return pt call to discuss further.

## 2011-08-07 ENCOUNTER — Observation Stay (HOSPITAL_COMMUNITY)
Admission: EM | Admit: 2011-08-07 | Discharge: 2011-08-09 | Disposition: A | Discharge status: Home / Self Care | Payer: Medicaid Other | Attending: Internal Medicine | Admitting: Internal Medicine

## 2011-08-07 ENCOUNTER — Emergency Department (HOSPITAL_COMMUNITY): Payer: Medicaid Other

## 2011-08-07 ENCOUNTER — Encounter (HOSPITAL_COMMUNITY): Payer: Self-pay

## 2011-08-07 DIAGNOSIS — R0989 Other specified symptoms and signs involving the circulatory and respiratory systems: Secondary | ICD-10-CM | POA: Insufficient documentation

## 2011-08-07 DIAGNOSIS — Z7901 Long term (current) use of anticoagulants: Secondary | ICD-10-CM

## 2011-08-07 DIAGNOSIS — L03818 Cellulitis of other sites: Secondary | ICD-10-CM

## 2011-08-07 DIAGNOSIS — I4891 Unspecified atrial fibrillation: Secondary | ICD-10-CM

## 2011-08-07 DIAGNOSIS — E119 Type 2 diabetes mellitus without complications: Secondary | ICD-10-CM

## 2011-08-07 DIAGNOSIS — Z79899 Other long term (current) drug therapy: Secondary | ICD-10-CM | POA: Insufficient documentation

## 2011-08-07 DIAGNOSIS — J40 Bronchitis, not specified as acute or chronic: Secondary | ICD-10-CM

## 2011-08-07 DIAGNOSIS — Z9889 Other specified postprocedural states: Secondary | ICD-10-CM

## 2011-08-07 DIAGNOSIS — I2510 Atherosclerotic heart disease of native coronary artery without angina pectoris: Secondary | ICD-10-CM | POA: Insufficient documentation

## 2011-08-07 DIAGNOSIS — I10 Essential (primary) hypertension: Secondary | ICD-10-CM

## 2011-08-07 DIAGNOSIS — Z951 Presence of aortocoronary bypass graft: Secondary | ICD-10-CM | POA: Insufficient documentation

## 2011-08-07 DIAGNOSIS — I679 Cerebrovascular disease, unspecified: Secondary | ICD-10-CM

## 2011-08-07 DIAGNOSIS — J069 Acute upper respiratory infection, unspecified: Principal | ICD-10-CM

## 2011-08-07 DIAGNOSIS — Z8673 Personal history of transient ischemic attack (TIA), and cerebral infarction without residual deficits: Secondary | ICD-10-CM | POA: Insufficient documentation

## 2011-08-07 DIAGNOSIS — I672 Cerebral atherosclerosis: Secondary | ICD-10-CM | POA: Insufficient documentation

## 2011-08-07 DIAGNOSIS — E785 Hyperlipidemia, unspecified: Secondary | ICD-10-CM

## 2011-08-07 DIAGNOSIS — G459 Transient cerebral ischemic attack, unspecified: Secondary | ICD-10-CM

## 2011-08-07 DIAGNOSIS — R509 Fever, unspecified: Secondary | ICD-10-CM

## 2011-08-07 DIAGNOSIS — E871 Hypo-osmolality and hyponatremia: Secondary | ICD-10-CM

## 2011-08-07 DIAGNOSIS — D509 Iron deficiency anemia, unspecified: Secondary | ICD-10-CM

## 2011-08-07 DIAGNOSIS — I25810 Atherosclerosis of coronary artery bypass graft(s) without angina pectoris: Secondary | ICD-10-CM

## 2011-08-07 DIAGNOSIS — R0902 Hypoxemia: Secondary | ICD-10-CM

## 2011-08-07 DIAGNOSIS — L02818 Cutaneous abscess of other sites: Secondary | ICD-10-CM

## 2011-08-07 MED ORDER — ACETAMINOPHEN 325 MG PO TABS
650.0000 mg | ORAL_TABLET | Freq: Once | ORAL | Status: AC
Start: 2011-08-07 — End: 2011-08-08
  Administered 2011-08-08: 650 mg via ORAL
  Filled 2011-08-07: qty 2

## 2011-08-07 MED ORDER — SODIUM CHLORIDE 0.9 % IV SOLN
INTRAVENOUS | Status: DC
  Administered 2011-08-07 – 2011-08-08 (×3): via INTRAVENOUS

## 2011-08-07 NOTE — ED Notes (Signed)
Pt complains of upper respiratory sx since Wednesday

## 2011-08-07 NOTE — ED Provider Notes (Addendum)
History     CSN: [id number]  Arrival date & time 08/07/11  2234   First MD Initiated Contact with Patient 08/07/11 2313      Chief Complaint  Patient presents with  . URI    (Consider location/radiation/quality/duration/timing/severity/associated sxs/prior treatment) HPI This is a 61 year old black female with a 5 day history of fever, chills, cough, body aches, slight sore throat, nausea and decreased appetite. She is here tonight because of worsening shortness of breath and generalized weakness. Her symptoms are now been relieved by over-the-counter medications. She states she is having severe pain in her chest when she coughs or takes a deep breath. She was noted to be hypoxic and febrile in triage. She describes her symptoms as moderate to severe, particularly the shortness of breath and weakness. She states she is chilled at the present time and requests blankets. She also has some chronic pain in her right knee for which an x-ray is pending. She denies vomiting or diarrhea.  Past Medical History  Diagnosis Date  . Coronary artery disease     s/p CABG 06/2010: S-D2; S-PDA  . 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 [name]  . Hypertension     essential, NOS  . Hyperlipidemia   . Hx of transient ischemic attack (TIA)   . Anemia, iron deficiency   . Diabetes mellitus     type II  . Stenosis of middle cerebral artery   . Abscess in epidural space of L2-L5 lumbar spine 03/2006    Past Surgical History  Procedure Date  . Vesicovaginal fistula closure w/ tah   . History of ankle fractures requiring surgery   . Mv repair and resection of mass 1/12  . Coronary artery bypass graft 1/12    Family History  Problem Relation Age of Onset  . Cancer Mother     colon  . Cancer Father     colon  . Cirrhosis Sister     died of GI bleed associated with cirrhosis of the liver  . Stroke Brother     History  Substance Use Topics  .  Smoking status: Former Research scientist (life sciences)  . Smokeless tobacco: Not on file  . Alcohol Use: No    OB History    Grav Para Term Preterm Abortions TAB SAB Ect Mult Living                  Review of Systems  All other systems reviewed and are negative.    Allergies  Review of patient's allergies indicates no known allergies.  Home Medications   Current Outpatient Rx  Name Route Sig Dispense Refill  . ASPIRIN EC 81 MG PO TBEC Oral Take 1 tablet (81 mg total) by mouth daily. 30 tablet 2  . GLIPIZIDE 10 MG PO TABS Oral Take 10 mg by mouth daily.     [name] LOSARTAN POTASSIUM 50 MG PO TABS Oral Take 1 tablet (50 mg total) by mouth daily. 30 tablet 12  . METFORMIN HCL 500 MG PO TABS Oral Take 500 mg by mouth daily.     [name] METOCLOPRAMIDE HCL 10 MG PO TABS Oral Take 10 mg by mouth 3 (three) times daily before meals.     [name] METOPROLOL TARTRATE 25 MG PO TABS Oral Take 1 tablet (25 mg total) by mouth 2 (two) times daily. 60 tablet 12  . PRAVASTATIN SODIUM 40 MG PO TABS Oral Take 1 tablet (40 mg  total) by mouth every evening. 30 tablet 11  . TRAMADOL HCL 50 MG PO TABS Oral Take 50 mg by mouth 3 (three) times daily as needed. For pain    . WARFARIN SODIUM 5 MG PO TABS  take as directed by Anticoagulation clinic 75 tablet 0    BP 140/73  Pulse 117  Temp(Src) 100.9 F (38.3 C) (Oral)  Resp 24  SpO2 86%  Physical Exam General: Well-developed, well-nourished female in no acute distress; appearance consistent with age of record HENT: normocephalic, atraumatic Eyes: pupils equal round and reactive to light; extraocular muscles intact; arcus senilis bilaterally Neck: supple Heart: regular rate and rhythm; no murmurs Lungs: Rales in the bases; rattly cough Abdomen: soft; nondistended; nontender; no masses or hepatosplenomegaly; bowel sounds present Extremities: No deformity; pulses normal; no edema Neurologic: Awake, alert and oriented; motor function intact in all extremities and symmetric; no facial  droop Skin: Warm and dry    ED Course  Procedures (including critical care time)     MDM   Nursing notes and vitals signs, including pulse oximetry, reviewed.  Summary of this visit's results, reviewed by myself:  Labs:  Results for orders placed during the hospital encounter of 08/07/11  CBC      Component Value Range   WBC 7.2  4.0 - 10.5 (K/uL)   RBC 4.46  3.87 - 5.11 (MIL/uL)   Hemoglobin 11.9 (*) 12.0 - 15.0 (g/dL)   HCT 36.1  36.0 - 46.0 (%)   MCV 80.9  78.0 - 100.0 (fL)   MCH 26.7  26.0 - 34.0 (pg)   MCHC 33.0  30.0 - 36.0 (g/dL)   RDW 14.4  11.5 - 15.5 (%)   Platelets 149 (*) 150 - 400 (K/uL)  DIFFERENTIAL      Component Value Range   Neutrophils Relative 73  43 - 77 (%)   Neutro Abs 5.2  1.7 - 7.7 (K/uL)   Lymphocytes Relative 20  12 - 46 (%)   Lymphs Abs 1.4  0.7 - 4.0 (K/uL)   Monocytes Relative 8  3 - 12 (%)   Monocytes Absolute 0.6  0.1 - 1.0 (K/uL)   Eosinophils Relative 0  0 - 5 (%)   Eosinophils Absolute 0.0  0.0 - 0.7 (K/uL)   Basophils Relative 0  0 - 1 (%)   Basophils Absolute 0.0  0.0 - 0.1 (K/uL)  BASIC METABOLIC PANEL      Component Value Range   Sodium 134 (*) 135 - 145 (mEq/L)   Potassium 4.5  3.5 - 5.1 (mEq/L)   Chloride 99  96 - 112 (mEq/L)   CO2 25  19 - 32 (mEq/L)   Glucose, Bld 166 (*) 70 - 99 (mg/dL)   BUN 13  6 - 23 (mg/dL)   Creatinine, Ser 1.02  0.50 - 1.10 (mg/dL)   Calcium 8.9  8.4 - 10.5 (mg/dL)   GFR calc non Af Amer 58 (*) >90 (mL/min)   GFR calc Af Amer 67 (*) >90 (mL/min)  PROTIME-INR      Component Value Range   Prothrombin Time 22.0 (*) 11.6 - 15.2 (seconds)   INR 1.89 (*) 0.00 - 1.49     Imaging Studies: Dg Chest 2 View  08/07/2011  *RADIOLOGY REPORT*  Clinical Data: Rule the chest pain.  Upper respiratory infection. The  CHEST - 2 VIEW  Comparison: Two-view chest 08/06/2010.  Findings: The heart size is normal.  The patient is status post median sternotomy for CABG.  Previously seen right lateral airspace  disease and effusion has cleared.  Minimal bibasilar atelectasis is present.  The lungs are otherwise clear. The visualized soft tissues and bony thorax are unremarkable.  IMPRESSION:  1.  No acute cardiopulmonary disease. 2.  Minimal bibasilar atelectasis.  Original Report Authenticated By: Resa Miner. MATTERN, M.D.   2:21 AM Feels better after nebulizer treatment but oxygen saturation is only 90% on oxygen by nasal cannula. Will have patient admitted. We'll start antibiotics for possible nascent pneumonia.         Wynetta Fines, MD 08/08/11 0221  Wynetta Fines, MD 08/08/11 [phone]

## 2011-08-08 ENCOUNTER — Encounter (HOSPITAL_COMMUNITY): Payer: Self-pay | Admitting: Internal Medicine

## 2011-08-08 DIAGNOSIS — R509 Fever, unspecified: Secondary | ICD-10-CM | POA: Diagnosis present

## 2011-08-08 DIAGNOSIS — E871 Hypo-osmolality and hyponatremia: Secondary | ICD-10-CM | POA: Diagnosis present

## 2011-08-08 DIAGNOSIS — R0902 Hypoxemia: Secondary | ICD-10-CM | POA: Diagnosis present

## 2011-08-08 DIAGNOSIS — J069 Acute upper respiratory infection, unspecified: Secondary | ICD-10-CM | POA: Diagnosis present

## 2011-08-08 DIAGNOSIS — J40 Bronchitis, not specified as acute or chronic: Secondary | ICD-10-CM | POA: Diagnosis present

## 2011-08-08 LAB — MRSA PCR SCREENING: MRSA by PCR: NEGATIVE

## 2011-08-08 LAB — CBC
HCT: 36.1 % (ref 36.0–46.0)
Hemoglobin: 11.9 g/dL — ABNORMAL LOW (ref 12.0–15.0)
MCH: 26.7 pg (ref 26.0–34.0)
MCHC: 33 g/dL (ref 30.0–36.0)

## 2011-08-08 LAB — BASIC METABOLIC PANEL
BUN: 13 mg/dL (ref 6–23)
Chloride: 99 mEq/L (ref 96–112)
Creatinine, Ser: 1.02 mg/dL (ref 0.50–1.10)
GFR calc Af Amer: 67 mL/min — ABNORMAL LOW (ref >90)
GFR calc non Af Amer: 58 mL/min — ABNORMAL LOW (ref >90)
Glucose, Bld: 166 mg/dL — ABNORMAL HIGH (ref 70–99)

## 2011-08-08 LAB — DIFFERENTIAL
Basophils Relative: 0 % (ref 0–1)
Eosinophils Absolute: 0 10*3/uL (ref 0.0–0.7)
Monocytes Absolute: 0.6 10*3/uL (ref 0.1–1.0)
Monocytes Relative: 8 % (ref 3–12)

## 2011-08-08 LAB — INFLUENZA PANEL BY PCR (TYPE A & B)
H1N1 flu by pcr: NOT DETECTED
Influenza A By PCR: NEGATIVE
Influenza B By PCR: NEGATIVE

## 2011-08-08 LAB — GLUCOSE, CAPILLARY
Glucose-Capillary: 189 mg/dL — ABNORMAL HIGH (ref 70–99)
Glucose-Capillary: 110 mg/dL — ABNORMAL HIGH (ref 70–99)

## 2011-08-08 MED ORDER — GUAIFENESIN ER 600 MG PO TB12
600.0000 mg | ORAL_TABLET | Freq: Two times a day (BID) | ORAL | Status: DC
  Administered 2011-08-08 – 2011-08-09 (×3): 600 mg via ORAL
  Filled 2011-08-08 (×4): qty 1

## 2011-08-08 MED ORDER — SODIUM CHLORIDE 0.9 % IV SOLN
Freq: Once | INTRAVENOUS | Status: AC
  Administered 2011-08-08: 08:00:00 via INTRAVENOUS

## 2011-08-08 MED ORDER — LOSARTAN POTASSIUM 50 MG PO TABS
100.0000 mg | ORAL_TABLET | Freq: Every day | ORAL | Status: DC
  Administered 2011-08-08 – 2011-08-09 (×2): 100 mg via ORAL
  Filled 2011-08-08 (×2): qty 2

## 2011-08-08 MED ORDER — DIPHENHYDRAMINE HCL 50 MG PO CAPS
50.0000 mg | ORAL_CAPSULE | Freq: Every evening | ORAL | Status: DC | PRN
  Administered 2011-08-09: 50 mg via ORAL
  Filled 2011-08-08: qty 1

## 2011-08-08 MED ORDER — DEXTROSE 5 % IV SOLN
1.0000 g | Freq: Once | INTRAVENOUS | Status: AC
  Administered 2011-08-08: 1 g via INTRAVENOUS
  Filled 2011-08-08: qty 10

## 2011-08-08 MED ORDER — IPRATROPIUM BROMIDE 0.02 % IN SOLN
0.5000 mg | Freq: Four times a day (QID) | RESPIRATORY_TRACT | Status: DC
  Administered 2011-08-08 – 2011-08-09 (×3): 0.5 mg via RESPIRATORY_TRACT
  Filled 2011-08-08 (×4): qty 2.5

## 2011-08-08 MED ORDER — METOPROLOL TARTRATE 25 MG PO TABS
25.0000 mg | ORAL_TABLET | Freq: Two times a day (BID) | ORAL | Status: DC
  Administered 2011-08-08 – 2011-08-09 (×3): 25 mg via ORAL
  Filled 2011-08-08 (×4): qty 1

## 2011-08-08 MED ORDER — ACETAMINOPHEN 650 MG RE SUPP: 650.0000 mg | Freq: Four times a day (QID) | RECTAL | Status: DC | PRN

## 2011-08-08 MED ORDER — WARFARIN SODIUM 2.5 MG PO TABS
12.5000 mg | ORAL_TABLET | Freq: Once | ORAL | Status: AC
  Administered 2011-08-08: 12.5 mg via ORAL
  Filled 2011-08-08: qty 1

## 2011-08-08 MED ORDER — ONDANSETRON HCL 4 MG/2ML IJ SOLN: 4.0000 mg | Freq: Four times a day (QID) | INTRAMUSCULAR | Status: DC | PRN

## 2011-08-08 MED ORDER — DOCUSATE SODIUM 100 MG PO CAPS
100.0000 mg | ORAL_CAPSULE | Freq: Two times a day (BID) | ORAL | Status: DC
  Administered 2011-08-08 – 2011-08-09 (×3): 100 mg via ORAL
  Filled 2011-08-08 (×4): qty 1

## 2011-08-08 MED ORDER — ALBUTEROL SULFATE (5 MG/ML) 0.5% IN NEBU
2.5000 mg | INHALATION_SOLUTION | Freq: Four times a day (QID) | RESPIRATORY_TRACT | Status: DC
  Administered 2011-08-08 – 2011-08-09 (×3): 2.5 mg via RESPIRATORY_TRACT
  Filled 2011-08-08 (×4): qty 0.5

## 2011-08-08 MED ORDER — ACETAMINOPHEN 325 MG PO TABS
650.0000 mg | ORAL_TABLET | Freq: Four times a day (QID) | ORAL | Status: DC | PRN
Start: 2011-08-08 — End: 2011-08-09
  Administered 2011-08-09: 650 mg via ORAL
  Filled 2011-08-08: qty 2

## 2011-08-08 MED ORDER — WARFARIN SODIUM 5 MG PO TABS
5.0000 mg | ORAL_TABLET | Freq: Once | ORAL | Status: AC
  Administered 2011-08-08: 5 mg via ORAL
  Filled 2011-08-08 (×2): qty 1

## 2011-08-08 MED ORDER — OSELTAMIVIR PHOSPHATE 75 MG PO CAPS
75.0000 mg | ORAL_CAPSULE | Freq: Two times a day (BID) | ORAL | Status: DC
  Administered 2011-08-08: 75 mg via ORAL
  Filled 2011-08-08 (×4): qty 1

## 2011-08-08 MED ORDER — DOXYLAMINE SUCCINATE (SLEEP) 25 MG PO TABS
25.0000 mg | ORAL_TABLET | Freq: Every evening | ORAL | Status: DC | PRN
  Filled 2011-08-08 (×2): qty 1

## 2011-08-08 MED ORDER — METFORMIN HCL 500 MG PO TABS
500.0000 mg | ORAL_TABLET | Freq: Every day | ORAL | Status: DC
  Administered 2011-08-08 – 2011-08-09 (×2): 500 mg via ORAL
  Filled 2011-08-08 (×2): qty 1

## 2011-08-08 MED ORDER — VITAMINS A & D EX OINT
TOPICAL_OINTMENT | CUTANEOUS | Status: AC
  Filled 2011-08-08: qty 5

## 2011-08-08 MED ORDER — AZITHROMYCIN 250 MG PO TABS
250.0000 mg | ORAL_TABLET | Freq: Every day | ORAL | Status: DC
  Administered 2011-08-08 – 2011-08-09 (×2): 250 mg via ORAL
  Filled 2011-08-08 (×2): qty 1

## 2011-08-08 MED ORDER — SENNA 8.6 MG PO TABS
1.0000 | ORAL_TABLET | Freq: Two times a day (BID) | ORAL | Status: DC
  Administered 2011-08-08 – 2011-08-09 (×3): 8.6 mg via ORAL
  Filled 2011-08-08 (×3): qty 1

## 2011-08-08 MED ORDER — ALBUTEROL SULFATE (5 MG/ML) 0.5% IN NEBU
5.0000 mg | INHALATION_SOLUTION | Freq: Once | RESPIRATORY_TRACT | Status: AC
  Administered 2011-08-08: 5 mg via RESPIRATORY_TRACT
  Filled 2011-08-08: qty 1

## 2011-08-08 MED ORDER — AZITHROMYCIN 500 MG IV SOLR
500.0000 mg | Freq: Once | INTRAVENOUS | Status: AC
  Administered 2011-08-08: 500 mg via INTRAVENOUS
  Filled 2011-08-08: qty 500

## 2011-08-08 MED ORDER — HEPARIN SODIUM (PORCINE) 5000 UNIT/ML IJ SOLN
5000.0000 [IU] | Freq: Three times a day (TID) | INTRAMUSCULAR | Status: DC
  Administered 2011-08-08 – 2011-08-09 (×3): 5000 [IU] via SUBCUTANEOUS
  Filled 2011-08-08 (×7): qty 1

## 2011-08-08 MED ORDER — ASPIRIN EC 81 MG PO TBEC
81.0000 mg | DELAYED_RELEASE_TABLET | Freq: Every day | ORAL | Status: DC
  Administered 2011-08-08 – 2011-08-09 (×2): 81 mg via ORAL
  Filled 2011-08-08 (×2): qty 1

## 2011-08-08 MED ORDER — ALBUTEROL SULFATE (5 MG/ML) 0.5% IN NEBU: 2.5000 mg | INHALATION_SOLUTION | RESPIRATORY_TRACT | Status: DC | PRN

## 2011-08-08 MED ORDER — SIMVASTATIN 20 MG PO TABS
20.0000 mg | ORAL_TABLET | Freq: Every day | ORAL | Status: DC
  Administered 2011-08-08: 20 mg via ORAL
  Filled 2011-08-08 (×2): qty 1

## 2011-08-08 MED ORDER — IPRATROPIUM BROMIDE 0.02 % IN SOLN
0.5000 mg | Freq: Once | RESPIRATORY_TRACT | Status: AC
  Administered 2011-08-08: 0.5 mg via RESPIRATORY_TRACT
  Filled 2011-08-08: qty 2.5

## 2011-08-08 MED ORDER — GLIPIZIDE 10 MG PO TABS
10.0000 mg | ORAL_TABLET | Freq: Every day | ORAL | Status: DC
  Administered 2011-08-08 – 2011-08-09 (×2): 10 mg via ORAL
  Filled 2011-08-08 (×2): qty 1

## 2011-08-08 MED ORDER — ONDANSETRON HCL 4 MG PO TABS: 4.0000 mg | ORAL_TABLET | Freq: Four times a day (QID) | ORAL | Status: DC | PRN

## 2011-08-08 NOTE — ED Notes (Signed)
Report given to Carilyn Goodpasture, RN

## 2011-08-08 NOTE — Progress Notes (Signed)
ANTICOAGULATION CONSULT NOTE - Initial Consult  Pharmacy Consult for warfarin Indication: hx of CVA  No Known Allergies  Patient Measurements:   Heparin Dosing Weight:   Vital Signs: Temp: 100.9 F (38.3 C) (02/10 2245) Temp src: Oral (02/10 2245) BP: 129/60 mmHg (02/11 0245) Pulse Rate: 101  (02/11 0300)  Labs:  Basename 08/07/11 2346  HGB 11.9*  HCT 36.1  PLT 149*  APTT --  LABPROT 22.0*  INR 1.89*  HEPARINUNFRC --  CREATININE 1.02  CKTOTAL --  CKMB --  TROPONINI --   The CrCl is unknown because both a height and weight (above a minimum accepted value) are required for this calculation.  Medical History: Past Medical History  Diagnosis Date  . Coronary artery disease 06/2010    s/p CABG 06/2010: S-D2; S-PDA  . 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 [name]  . Hypertension     essential, NOS  . Hyperlipidemia   . Hx of transient ischemic attack (TIA)   . Anemia, iron deficiency   . Diabetes mellitus     type II  . Stenosis of middle cerebral artery   . Abscess in epidural space of L2-L5 lumbar spine 03/2006    Medications:  Prescriptions prior to admission  Medication Sig Dispense Refill  . aspirin EC 81 MG tablet Take 1 tablet (81 mg total) by mouth daily.  30 tablet  2  . dextromethorphan-guaiFENesin (MUCINEX DM) 30-600 MG per 12 hr tablet Take 1 tablet by mouth every 12 (twelve) hours.      [name] doxylamine, Sleep, (UNISOM) 25 MG tablet Take 25 mg by mouth at bedtime as needed.      [name] glipiZIDE (GLUCOTROL) 10 MG tablet Take 10 mg by mouth daily.       [name] losartan (COZAAR) 100 MG tablet Take 100 mg by mouth daily.      . metFORMIN (GLUCOPHAGE) 500 MG tablet Take 500 mg by mouth daily.       . metoCLOPramide (REGLAN) 10 MG tablet Take 10 mg by mouth 3 (three) times daily before meals.       . metoprolol tartrate (LOPRESSOR) 25 MG tablet Take 1 tablet (25 mg total) by mouth 2 (two) times daily.  60 tablet   12  . pravastatin (PRAVACHOL) 40 MG tablet Take 1 tablet (40 mg total) by mouth every evening.  30 tablet  11  . traMADol (ULTRAM) 50 MG tablet Take 50 mg by mouth 3 (three) times daily as needed. For pain      . warfarin (COUMADIN) 5 MG tablet Take 10-12.5 mg by mouth daily. M,F take 10mg  T,W,TH,S,SUN take 12.5 mg  take as directed by Anticoagulation clinic         Assessment: Patient on chronic warfarin for hx of CVA with Low INR.  5mg  given in ED 2/10.  Goal of Therapy:  INR 2-3  Plan:  Warfarin 12.5mg  po x1, daily INR  Nani Skillern Crowford 08/08/2011,5:44 AM

## 2011-08-08 NOTE — H&P (Signed)
PCP:  No primary provider on file. She goes to [facility] cardiology  Chief Complaint:  Viral respiratory symptoms  HPI: 60yoF with h/o CVA 11/2009 and TIA 05/2010, mitral valve papillary fibroelastoma s/p  resection and 2v CABG in 06/2010, middle cerebral artery stenosis presents with upper and  lower respiratory viral infectious type symptoms complicated by hypoxia in the ED.   Pt states almost a week ago she began having upper respiratory infectious type symptoms  consisting of cough, sneezing, runny nose, minimal postnasal drip, minimal sore throat,  cough productive of minimal whitish thick sputum, cold shaking chills, and diarrhea. She  has felt run down and malaised, and not eating much. No high spiking fevers she's noted  but hasn't measured temp. Her chest hurts from having coughed so much, but she denies any  crushing pressure cardiac type chest pain.   In the ED pt was febrile to 100.9, tachy to 117, hypoxic to 86%, improved to mid 90's on  [place]. Labs with hypoNa 134, otherwise normal. CBC normal. INR was 1.89. CXR showed no acute  cardiopulmonary disease, but minimal bibasilar atelectasis, and previously seen right  lateral airspace disease and effusion had cleared. Pt was given tylenol,  albuterol/ipratropium, coumadin 5mg , ceftriaxone and azithromycin. Admission was  requested for the hypoxia.   ROS o/w negative   Past Medical History  Diagnosis Date  . Coronary artery disease 06/2010    s/p CABG 06/2010: S-D2; S-PDA  . 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 [name]  . Hypertension     essential, NOS  . Hyperlipidemia   . Hx of transient ischemic attack (TIA)   . Anemia, iron deficiency   . Diabetes mellitus     type II  . Stenosis of middle cerebral artery   . Abscess in epidural space of L2-L5 lumbar spine 03/2006    Past Surgical History  Procedure Date  . Vesicovaginal fistula closure w/ tah    . History of ankle fractures requiring surgery   . Mv repair and resection of mass 1/12  . Coronary artery bypass graft 1/12   Medications:  HOME MEDS: Reconciled by name with the patient.  Prior to Admission medications   Medication Sig Start Date End Date Taking? Authorizing Provider  aspirin EC 81 MG tablet Take 1 tablet (81 mg total) by mouth daily. 11/16/10 11/16/11 Yes Lelon Perla, MD  dextromethorphan-guaiFENesin [facility] DM) 30-600 MG per 12 hr tablet Take 1 tablet by mouth every 12 (twelve) hours.   Yes Historical Provider, MD  doxylamine, Sleep, (UNISOM) 25 MG tablet Take 25 mg by mouth at bedtime as needed.   Yes Historical Provider, MD  glipiZIDE (GLUCOTROL) 10 MG tablet Take 10 mg by mouth daily.    Yes Historical Provider, MD  losartan (COZAAR) 100 MG tablet Take 100 mg by mouth daily.   Yes Historical Provider, MD  metFORMIN (GLUCOPHAGE) 500 MG tablet Take 500 mg by mouth daily.    Yes Historical Provider, MD  metoCLOPramide (REGLAN) 10 MG tablet Take 10 mg by mouth 3 (three) times daily before meals.    Yes Historical Provider, MD  metoprolol tartrate (LOPRESSOR) 25 MG tablet Take 1 tablet (25 mg total) by mouth 2 (two) times daily. 05/27/11  Yes Lelon Perla, MD  pravastatin (PRAVACHOL) 40 MG tablet Take 1 tablet (40 mg total) by mouth every evening. 05/27/11 05/26/12 Yes Lelon Perla, MD  traMADol Veatrice Bourbon) 50  MG tablet Take 50 mg by mouth 3 (three) times daily as needed. For pain   Yes Historical Provider, MD  warfarin (COUMADIN) 5 MG tablet Take 10-12.5 mg by mouth daily. M,F take 10mg  T,W,TH,S,SUN take 12.5 mg  take as directed by Anticoagulation clinic  07/13/11  Yes Lelon Perla, MD   Allergies:  No Known Allergies  Social History:   reports that she has quit smoking. She does not have any smokeless tobacco history on file. She reports that she does not drink alcohol. Her drug history not on file. Lives at home with her husband.   Family  History: Family History  Problem Relation Age of Onset  . Cancer Mother     colon  . Cancer Father     colon  . Cirrhosis Sister     died of GI bleed associated with cirrhosis of the liver  . Stroke Brother     Physical Exam: Filed Vitals:   08/08/11 0215 08/08/11 0230 08/08/11 0245 08/08/11 0300  BP:   129/60   Pulse: 100 103 101 101  Temp:      TempSrc:      Resp: 23 19 20 15   SpO2: 90% 90% 90% 93%   Blood pressure 129/60, pulse 101, temperature 100.9 F (38.3 C), temperature source Oral, resp. rate 15, SpO2 93.00%.  Gen: Younger than stated age appearing F in no distress but does occasionally have a  junky sounding cough, but is breathing well no distress, no increased WOB, no accessory  muscles. Can relate history well doesn't look toxic HEENT: Arcus senilis, pupils round, sclera clear, normal. Mouth moderately dry appearing.  Lungs: Light inspiratory rales/crackles in the bases, but fairly clear otherwise, good  air movement, no wheezing Heart: regular, not tachycardic, holosystolic murmur heard throughout.  Abd: Obese but soft, NT ND benign overall Extrem: Warm, perfusing well, increased bulk, normal tone Neuro: Alert attentive CN 2-12 intact, moves extremities well, normal exam, non-focal   Labs & Imaging Results for orders placed during the hospital encounter of 08/07/11 (from the past 48 hour(s))  CBC     Status: Abnormal   Collection Time   08/07/11 11:46 PM      Component Value Range Comment   WBC 7.2  4.0 - 10.5 (K/uL)    RBC 4.46  3.87 - 5.11 (MIL/uL)    Hemoglobin 11.9 (*) 12.0 - 15.0 (g/dL)    HCT 36.1  36.0 - 46.0 (%)    MCV 80.9  78.0 - 100.0 (fL)    MCH 26.7  26.0 - 34.0 (pg)    MCHC 33.0  30.0 - 36.0 (g/dL)    RDW 14.4  11.5 - 15.5 (%)    Platelets 149 (*) 150 - 400 (K/uL)   DIFFERENTIAL     Status: Normal   Collection Time   08/07/11 11:46 PM      Component Value Range Comment   Neutrophils Relative 73  43 - 77 (%)    Neutro Abs 5.2  1.7 - 7.7  (K/uL)    Lymphocytes Relative 20  12 - 46 (%)    Lymphs Abs 1.4  0.7 - 4.0 (K/uL)    Monocytes Relative 8  3 - 12 (%)    Monocytes Absolute 0.6  0.1 - 1.0 (K/uL)    Eosinophils Relative 0  0 - 5 (%)    Eosinophils Absolute 0.0  0.0 - 0.7 (K/uL)    Basophils Relative 0  0 - 1 (%)  Basophils Absolute 0.0  0.0 - 0.1 (K/uL)   BASIC METABOLIC PANEL     Status: Abnormal   Collection Time   08/07/11 11:46 PM      Component Value Range Comment   Sodium 134 (*) 135 - 145 (mEq/L)    Potassium 4.5  3.5 - 5.1 (mEq/L)    Chloride 99  96 - 112 (mEq/L)    CO2 25  19 - 32 (mEq/L)    Glucose, Bld 166 (*) 70 - 99 (mg/dL)    BUN 13  6 - 23 (mg/dL)    Creatinine, Ser 1.02  0.50 - 1.10 (mg/dL)    Calcium 8.9  8.4 - 10.5 (mg/dL)    GFR calc non Af Amer 58 (*) >90 (mL/min)    GFR calc Af Amer 67 (*) >90 (mL/min)   PROTIME-INR     Status: Abnormal   Collection Time   08/07/11 11:46 PM      Component Value Range Comment   Prothrombin Time 22.0 (*) 11.6 - 15.2 (seconds)    INR 1.89 (*) 0.00 - 1.49     Dg Chest 2 View  08/07/2011  *RADIOLOGY REPORT*  Clinical Data: Rule the chest pain.  Upper respiratory infection. The  CHEST - 2 VIEW  Comparison: Two-view chest 08/06/2010.  Findings: The heart size is normal.  The patient is status post median sternotomy for CABG.  Previously seen right lateral airspace disease and effusion has cleared.  Minimal bibasilar atelectasis is present.  The lungs are otherwise clear. The visualized soft tissues and bony thorax are unremarkable.  IMPRESSION:  1.  No acute cardiopulmonary disease. 2.  Minimal bibasilar atelectasis.  Original Report Authenticated By: Resa Miner. MATTERN, M.D.    Impression Present on Admission:  .Upper respiratory infection .Bronchitis .Hypoxia .Fever .Hyponatremia .DM w/o Complication Type II  [id number] with h/o CVA 11/2009 and TIA 05/2010, mitral valve papillary fibroelastoma s/p  resection and 2v CABG in 06/2010, middle cerebral artery  stenosis presents with upper and  lower respiratory viral infectious type symptoms complicated by hypoxia in the ED.   1. Fever, tachycardia, hypoxia: She has an upper and likely lower viral respiratory  infection, possibly influenza. She has no WBC count. Reason for admission is hypoxia  noted in the ED. Therefore, will give supportive care, test for flu. No signs of full on  lobar consolidative PNA, but atypical PNA possible so will keep azithromycin.   - Admit observation, once her hypoxia resolves she can be discharged, PT consult with  ambultory O2 sats.  - Flu swab, mucinex, nebulizers, keep on O2 wean as tolerated, stop ceftriaxone and  continue azithromycin, IVF's   2. HypoNa: very minimal. Likely poor PO intake. IVF's and trend.   3. MV mass s/p repair, on Coumadin: Minimally low INR, will trend daily INR and adjust  coumadin per pharmacy   4. Diabetes: continue home glipizide and metformin, do not anticipate any contrast  studies   5. HTN: Continue home losartan, metoprolol, statin, ASA 81  Regular, [facility] team 5 Presumed full code   Other plans as per orders.   Janda Cargo 08/08/2011, 4:49 AM

## 2011-08-08 NOTE — Progress Notes (Signed)
Patient is a pleasant 60 year old black woman who was just admitted a few hours ago for hypoxemia and viral respiratory symptoms. 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 Chest x-ray does not show evidence for pneumonia. Agree with checking her influenza PCR, and agree with the azithromycin given possibility of atypical pneumonia, I will go ahead and treat her presumptively with Tamiflu pending PCR results. We'll also try to wean her off oxygen. As soon as she is able to tolerate activity on room air we'll proceed with discharging her home.
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
## 2011-08-09 LAB — CBC
HCT: 31.2 % — ABNORMAL LOW (ref 36.0–46.0)
Hemoglobin: 10.3 g/dL — ABNORMAL LOW (ref 12.0–15.0)
MCV: 80 fL (ref 78.0–100.0)
Platelets: 155 10*3/uL (ref 150–400)
RBC: 3.9 MIL/uL (ref 3.87–5.11)
WBC: 5.4 10*3/uL (ref 4.0–10.5)

## 2011-08-09 LAB — BASIC METABOLIC PANEL
CO2: 23 mEq/L (ref 19–32)
Calcium: 8.6 mg/dL (ref 8.4–10.5)
Chloride: 104 mEq/L (ref 96–112)
Potassium: 4 mEq/L (ref 3.5–5.1)
Sodium: 137 mEq/L (ref 135–145)

## 2011-08-09 LAB — GLUCOSE, CAPILLARY: Glucose-Capillary: 149 mg/dL — ABNORMAL HIGH (ref 70–99)

## 2011-08-09 LAB — PROTIME-INR: INR: 2.64 — ABNORMAL HIGH (ref 0.00–1.49)

## 2011-08-09 MED ORDER — IPRATROPIUM-ALBUTEROL 18-103 MCG/ACT IN AERO: 2.0000 | INHALATION_SPRAY | Freq: Four times a day (QID) | RESPIRATORY_TRACT | Status: DC

## 2011-08-09 MED ORDER — DM-GUAIFENESIN ER 30-600 MG PO TB12: 2.0000 | ORAL_TABLET | Freq: Two times a day (BID) | ORAL | Status: DC

## 2011-08-09 MED ORDER — AZITHROMYCIN 250 MG PO TABS: ORAL_TABLET | ORAL | Status: DC

## 2011-08-09 MED ORDER — IPRATROPIUM-ALBUTEROL 18-103 MCG/ACT IN AERO
2.0000 | INHALATION_SPRAY | Freq: Four times a day (QID) | RESPIRATORY_TRACT | Status: DC
  Administered 2011-08-09: 2 via RESPIRATORY_TRACT
  Filled 2011-08-09: qty 14.7

## 2011-08-09 MED ORDER — AZITHROMYCIN 250 MG PO TABS: ORAL_TABLET | ORAL | Status: AC

## 2011-08-09 NOTE — Discharge Summary (Signed)
Discharge Note  Name: [name] MRN: [id number] DOB: 07-05-1950 61 y.o.  Date of Admission: 08/07/2011 10:36 PM Date of Discharge: 08/09/2011 Attending Physician: Sheila Oats, MD  Discharge Diagnosis: Active Problems: Upper respiratory infection/ Bronchitis DM w/o Complication Type II S/P mitral valve repair  Hypoxia  Fever  Hyponatremia   Discharge Medications: Medication List  As of 08/09/2011 11:05 AM   TAKE these medications         albuterol-ipratropium 18-103 MCG/ACT inhaler   Commonly known as: COMBIVENT   Inhale 2 puffs into the lungs 4 (four) times daily.      aspirin EC 81 MG tablet   Take 1 tablet (81 mg total) by mouth daily.      azithromycin 250 MG tablet   Commonly known as: ZITHROMAX   For 3 more days      dextromethorphan-guaiFENesin 30-600 MG per 12 hr tablet   Commonly known as: MUCINEX DM   Take 2 tablets by mouth every 12 (twelve) hours.      doxylamine (Sleep) 25 MG tablet   Commonly known as: UNISOM   Take 25 mg by mouth at bedtime as needed.      glipiZIDE 10 MG tablet   Commonly known as: GLUCOTROL   Take 10 mg by mouth daily.      losartan 100 MG tablet   Commonly known as: COZAAR   Take 100 mg by mouth daily.      metFORMIN 500 MG tablet   Commonly known as: GLUCOPHAGE   Take 500 mg by mouth daily.      metoCLOPramide 10 MG tablet   Commonly known as: REGLAN   Take 10 mg by mouth 3 (three) times daily before meals.      metoprolol tartrate 25 MG tablet   Commonly known as: LOPRESSOR   Take 1 tablet (25 mg total) by mouth 2 (two) times daily.      pravastatin 40 MG tablet   Commonly known as: PRAVACHOL   Take 1 tablet (40 mg total) by mouth every evening.      traMADol 50 MG tablet   Commonly known as: ULTRAM   Take 50 mg by mouth 3 (three) times daily as needed. For pain      warfarin 5 MG tablet   Commonly known as: COUMADIN   Take 10-12.5 mg by mouth daily. M,F take 10mg   T,W,TH,S,SUN take 12.5  mg    take as directed by Anticoagulation clinic              Disposition and follow-up:   [name] was discharged from [facility] in improved/ condition.    Follow-up Appointments: Discharge Orders    Future Appointments: Provider: Department: Dept Phone: Center:   08/26/2011 1:45 PM Tiffany Hollace Kinnier, RN [facility] [phone] None     Future Orders Please Complete By Expires   Diet Carb Modified      Increase activity slowly         Consultations:    Procedures Performed:  Dg Chest 2 View  08/07/2011  *RADIOLOGY REPORT*  Clinical Data: Rule the chest pain.  Upper respiratory infection. The  CHEST - 2 VIEW  Comparison: Two-view chest 08/06/2010.  Findings: The heart size is normal.  The patient is status post median sternotomy for CABG.  Previously seen right lateral airspace disease and effusion has cleared.  Minimal bibasilar atelectasis is present.  The lungs are otherwise clear. The visualized soft tissues and  bony thorax are unremarkable.  IMPRESSION:  1.  No acute cardiopulmonary disease. 2.  Minimal bibasilar atelectasis.  Original Report Authenticated By: Resa Miner. MATTERN, M.D.     Admission HPI 70yoF with h/o CVA 11/2009 and TIA 05/2010, mitral valve papillary fibroelastoma s/p  resection and 2v CABG in 06/2010, middle cerebral artery stenosis presents with upper and  lower respiratory viral infectious type symptoms complicated by hypoxia in the ED.  Pt states almost a week ago she began having upper respiratory infectious type symptoms  consisting of cough, sneezing, runny nose, minimal postnasal drip, minimal sore throat,  cough productive of minimal whitish thick sputum, cold shaking chills, and diarrhea. She  has felt run down and malaised, and not eating much. No high spiking fevers she's noted  but hasn't measured temp. Her chest hurts from having coughed so much, but she denies any  crushing pressure cardiac type chest  pain.  In the ED pt was febrile to 100.9, tachy to 117, hypoxic to 86%, improved to mid 90's on  [place]. Labs with hypoNa 134, otherwise normal. CBC normal. INR was 1.89. CXR showed no acute  cardiopulmonary disease, but minimal bibasilar atelectasis, and previously seen right  lateral airspace disease and effusion had cleared. Pt was given tylenol,  albuterol/ipratropium, coumadin 5mg , ceftriaxone and azithromycin. Admission was  requested for the hypoxia.   Physical exam In general: alert and oriented x3 in no distress.  Lungs: Scattered faint inspiratory crackles,  good air movement, no wheezing  Heart: regular, not tachycardic, holosystolic murmur heard throughout.  Abd: Obese but soft, NT ND benign overall  Extrem: No cyanosis and no edema Neuro: CN 2-12 intact, moves extremities well, normal exam, non-focal   Hospital Course by problem list: Active Problems:  DM w/o Complication Type II  S/P mitral valve repair  Upper respiratory infection  Bronchitis  Hypoxia  Fever  Hyponatremia  1. bronchitis/URI:  As discussed above upon admission the patient had a chest x-ray done which showed no acute cardiopulmonary disease-minimal  bibasilar atelectasis was noted. Patient was also hypoxic to 86% on room air and was found to be febrile initially. Following admission she was started on bronchodilators mucolytics and antibiotics with Zithromax. An influenza panel was done and came back negative. The patient's symptoms improved-she is oxygenating well on room air, no further shortness of breath and cough is much better. She is medically stable for discharge at this time and is to follow up outpatient. 2. HypoNa: very minimal. Likely secondary to volume depletion, resolved with IV fluids. Her .  3. MV mass s/p repair, on Coumadin: She was maintained on her Coumadin during this hospital stay, her INR today is therapeutic at 2.64 [facility]/INR done and  further adjustment of her dose as appropriate. 4. Diabetes: She she was maintained on glipizide and metformin, and is to continue them upon discharge.  5. HTN: She was maintained on her outpatient medications and his continue them upon discharge.      Discharge Vitals:  BP 152/80  Pulse 79  Temp(Src) 98.2 F (36.8 C) (Oral)  Resp 18  Ht 5\' 7"  (1.702 m)  Wt 104.6 kg (230 lb 9.6 oz)  BMI 36.12 kg/m2  SpO2 94%  Discharge Labs:  Results for orders placed during the hospital encounter of 08/07/11 (from the past 24 hour(s))  GLUCOSE, CAPILLARY     Status: Normal   Collection Time   08/08/11 12:15 PM  Component Value Range   Glucose-Capillary 88  70 - 99 (mg/dL)   Comment 1 Documented in Chart     Comment 2 Notify RN    GLUCOSE, CAPILLARY     Status: Abnormal   Collection Time   08/08/11  5:13 PM      Component Value Range   Glucose-Capillary 69 (*) 70 - 99 (mg/dL)   Comment 1 Documented in Chart     Comment 2 Notify RN    GLUCOSE, CAPILLARY     Status: Abnormal   Collection Time   08/08/11  6:49 PM      Component Value Range   Glucose-Capillary 110 (*) 70 - 99 (mg/dL)   Comment 1 Documented in Chart     Comment 2 Notify RN    PROTIME-INR     Status: Abnormal   Collection Time   08/09/11  3:40 AM      Component Value Range   Prothrombin Time 28.6 (*) 11.6 - 15.2 (seconds)   INR 2.64 (*) 0.00 - 99991111   BASIC METABOLIC PANEL     Status: Abnormal   Collection Time   08/09/11  3:40 AM      Component Value Range   Sodium 137  135 - 145 (mEq/L)   Potassium 4.0  3.5 - 5.1 (mEq/L)   Chloride 104  96 - 112 (mEq/L)   CO2 23  19 - 32 (mEq/L)   Glucose, Bld 90  70 - 99 (mg/dL)   BUN 10  6 - 23 (mg/dL)   Creatinine, Ser 0.93  0.50 - 1.10 (mg/dL)   Calcium 8.6  8.4 - 10.5 (mg/dL)   GFR calc non Af Amer 65 (*) >90 (mL/min)   GFR calc Af Amer 75 (*) >90 (mL/min)  CBC     Status: Abnormal   Collection Time   08/09/11  3:40 AM      Component Value Range   WBC 5.4  4.0 - 10.5  (K/uL)   RBC 3.90  3.87 - 5.11 (MIL/uL)   Hemoglobin 10.3 (*) 12.0 - 15.0 (g/dL)   HCT 31.2 (*) 36.0 - 46.0 (%)   MCV 80.0  78.0 - 100.0 (fL)   MCH 26.4  26.0 - 34.0 (pg)   MCHC 33.0  30.0 - 36.0 (g/dL)   RDW 14.5  11.5 - 15.5 (%)   Platelets 155  150 - 400 (K/uL)    Signed: Emilene Roma C 08/09/2011, 11:05 AM

## 2011-08-09 NOTE — Progress Notes (Signed)
CARE MANAGEMENT NOTE 08/09/2011  Patient:  ZENOBIA, ALDERFER   Account Number:  [id number]  Date Initiated:  08/09/2011  Documentation initiated by:  Holleigh Crihfield  Subjective/Objective Assessment:   pulnmonary congestion hx of mv replacement, cardiac diease, hypoxia on admit     Action/Plan:   lives at home   Anticipated DC Date:  08/12/2011   Anticipated DC Plan:  HOME/SELF CARE  In-house referral  NA      DC Planning Services  NA      [facility] Choice  NA   Choice offered to / List presented to:  NA   DME arranged  NA      DME agency  NA     [place] arranged  NA      [place] agency  NA   Status of service:  In process, will continue to follow Medicare Important Message given?  NA - LOS <3 / Initial given by admissions (If response is "NO", the following Medicare IM given date fields will be blank) Date Medicare IM given:   Date Additional Medicare IM given:    Discharge Disposition:    Per UR Regulation:  Reviewed for med. necessity/level of care/duration of stay  Comments:  [id number]/Logann Whitebread,RN,BSN,CCM

## 2011-08-09 NOTE — Progress Notes (Signed)
Pt requested sleep aid.  Contacted NP.  Received new orders.  Will continue to monitor pt. Gunnar Bulla Preston .  21:23 06/18/2012

## 2011-08-26 ENCOUNTER — Ambulatory Visit (INDEPENDENT_AMBULATORY_CARE_PROVIDER_SITE_OTHER): Payer: Medicaid Other | Admitting: *Deleted

## 2011-08-26 DIAGNOSIS — Z7901 Long term (current) use of anticoagulants: Secondary | ICD-10-CM

## 2011-08-26 DIAGNOSIS — G459 Transient cerebral ischemic attack, unspecified: Secondary | ICD-10-CM

## 2011-08-26 DIAGNOSIS — I4891 Unspecified atrial fibrillation: Secondary | ICD-10-CM

## 2011-08-26 LAB — POCT INR: INR: 1.9

## 2011-09-05 ENCOUNTER — Encounter: Payer: Self-pay | Admitting: Internal Medicine

## 2011-09-21 ENCOUNTER — Ambulatory Visit: Payer: Medicaid Other | Admitting: Internal Medicine

## 2011-09-26 ENCOUNTER — Ambulatory Visit (INDEPENDENT_AMBULATORY_CARE_PROVIDER_SITE_OTHER): Payer: Medicaid Other | Admitting: Pharmacist

## 2011-09-26 DIAGNOSIS — G459 Transient cerebral ischemic attack, unspecified: Secondary | ICD-10-CM

## 2011-09-26 DIAGNOSIS — Z7901 Long term (current) use of anticoagulants: Secondary | ICD-10-CM

## 2011-09-26 DIAGNOSIS — I4891 Unspecified atrial fibrillation: Secondary | ICD-10-CM

## 2011-10-07 ENCOUNTER — Other Ambulatory Visit: Payer: Self-pay | Admitting: Cardiology

## 2011-10-12 ENCOUNTER — Encounter: Payer: Self-pay | Admitting: Internal Medicine

## 2011-10-14 ENCOUNTER — Ambulatory Visit (INDEPENDENT_AMBULATORY_CARE_PROVIDER_SITE_OTHER): Payer: Medicaid Other | Admitting: Internal Medicine

## 2011-10-14 ENCOUNTER — Other Ambulatory Visit (INDEPENDENT_AMBULATORY_CARE_PROVIDER_SITE_OTHER): Payer: Medicaid Other

## 2011-10-14 ENCOUNTER — Telehealth: Payer: Self-pay | Admitting: Gastroenterology

## 2011-10-14 ENCOUNTER — Encounter: Payer: Self-pay | Admitting: Internal Medicine

## 2011-10-14 VITALS — BP 110/64 | HR 64 | Ht 67.75 in | Wt 237.2 lb

## 2011-10-14 DIAGNOSIS — D649 Anemia, unspecified: Secondary | ICD-10-CM

## 2011-10-14 DIAGNOSIS — K5900 Constipation, unspecified: Secondary | ICD-10-CM

## 2011-10-14 LAB — CBC WITH DIFFERENTIAL/PLATELET
Basophils Absolute: 0.1 10*3/uL (ref 0.0–0.1)
Eosinophils Absolute: 0.1 10*3/uL (ref 0.0–0.7)
Lymphocytes Relative: 38.4 % (ref 12.0–46.0)
MCHC: 32.6 g/dL (ref 30.0–36.0)
Neutrophils Relative %: 54 % (ref 43.0–77.0)
RBC: 4.52 Mil/uL (ref 3.87–5.11)
RDW: 16.1 % — ABNORMAL HIGH (ref 11.5–14.6)

## 2011-10-14 LAB — URINALYSIS, ROUTINE W REFLEX MICROSCOPIC
Hgb urine dipstick: NEGATIVE
Ketones, ur: NEGATIVE
Urine Glucose: NEGATIVE
Urobilinogen, UA: 0.2 (ref 0.0–1.0)

## 2011-10-14 LAB — IBC PANEL
Saturation Ratios: 10.6 % — ABNORMAL LOW (ref 20.0–50.0)
Transferrin: 331.5 mg/dL (ref 212.0–360.0)

## 2011-10-14 MED ORDER — PEG-KCL-NACL-NASULF-NA ASC-C 100 G PO SOLR: 1.0000 | Freq: Once | ORAL | Status: DC

## 2011-10-14 NOTE — Patient Instructions (Signed)
You have been scheduled for a colonoscopy/Endoscopy with propofol. Please follow written instructions given to you at your visit today.  Please pick up your prep kit at the pharmacy within the next 1-3 days.  Your physician has requested that you go to the basement for  lab work before leaving today:  You will be contacted about your [place]. If you do not hear from [place] at least 14 days prior to your procedure Please call us [phone]

## 2011-10-14 NOTE — Telephone Encounter (Signed)
  10/14/2011    RE: EMBERLIN HOWER DOB: Sep 06, 1950 MRN: [id number]   Dear Dr. [name],    We have scheduled the above patient for an endoscopic procedure (endo/colon). Our records show that she is on anticoagulation therapy.   Please advise as to how long the patient may come off her therapy of warfarin prior to the procedure, which is scheduled for 11/25/2011, and whether or not Lovenox bridge is recommended.  Also, normally we do not give prophylactic antibiotics for standard EGD (without dilation) and colonoscopy.  Would you recommend antibiotics in her case?  Please fax back/ or route the completed form to [place] or Caren Griffins at [phone].   Sincerely,  Zenovia Jarred,  MD
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
## 2011-10-21 ENCOUNTER — Telehealth: Payer: Self-pay | Admitting: Pharmacist

## 2011-10-21 ENCOUNTER — Telehealth: Payer: Self-pay | Admitting: Cardiology

## 2011-10-21 NOTE — Telephone Encounter (Signed)
Patient's chart reviewed today for continued Coumadin use. It was noted that atrial fibrillation is included in her problem list. There is no documentation that she has had this previously. It was placed in her chart by error. It will be removed. Her previous CVA may have been related to her mitral valve mass that was resected. We are discontinuing Coumadin and beginning aspirin for cerebrovascular disease. Kirk Ruths

## 2011-10-21 NOTE — Telephone Encounter (Signed)
Reviewed chart with Dr. [name] regarding pt stopping Coumadin for upcoming GI procedure.  During chart review, it was noticed that patient was originally placed on Coumadin for mass on MV that was thought to be causing her TIAs.  She has since had the mass removed.  We had originally planned on having patient f/u with neurology to determine if Coumadin could be switched to ASA only, but pt has an outstanding bill and is unable to make an appt at this time.   In light of this information, Dr. [name] felt it appropriate to stop Coumadin and continue ASA 81mg  daily only for cerebral artery stenosis.   Pt is aware of this information.  Will inform Dr. Vena Rua office of this medication change as well.

## 2011-10-21 NOTE — Telephone Encounter (Signed)
Okay per Dr. [name] to hold Coumadin 5 days prior to procedure.

## 2011-10-21 NOTE — Telephone Encounter (Signed)
Agree with Dcing coumadin and treating with ASA 81 mg daily. Kirk Ruths

## 2011-10-24 ENCOUNTER — Telehealth: Payer: Self-pay | Admitting: Cardiology

## 2011-10-24 NOTE — Telephone Encounter (Signed)
Spoke with pt. Explained to pt that Dr. [name] reviewed her chart last week.  Coumadin was started for mass on valve.  Since mass was removed, will stop Coumadin and continue ASA only for her cerebral artery stenosis.  She is agreeable with this plan.

## 2011-10-24 NOTE — Telephone Encounter (Signed)
New Problem:     Patient called in concerned that stopping her coumadin will have an adverse affect on her cerebral artery.  Please call back.

## 2011-11-25 ENCOUNTER — Ambulatory Visit (AMBULATORY_SURGERY_CENTER): Payer: Medicaid Other | Admitting: Internal Medicine

## 2011-11-25 ENCOUNTER — Encounter: Payer: Self-pay | Admitting: Internal Medicine

## 2011-11-25 VITALS — BP 137/78 | HR 89 | Temp 97.4°F | Resp 20 | Ht 67.0 in | Wt 237.0 lb

## 2011-11-25 DIAGNOSIS — K2990 Gastroduodenitis, unspecified, without bleeding: Secondary | ICD-10-CM

## 2011-11-25 DIAGNOSIS — D126 Benign neoplasm of colon, unspecified: Secondary | ICD-10-CM

## 2011-11-25 DIAGNOSIS — K2970 Gastritis, unspecified, without bleeding: Secondary | ICD-10-CM

## 2011-11-25 DIAGNOSIS — K635 Polyp of colon: Secondary | ICD-10-CM

## 2011-11-25 DIAGNOSIS — D649 Anemia, unspecified: Secondary | ICD-10-CM

## 2011-11-25 MED ORDER — PANTOPRAZOLE SODIUM 40 MG PO TBEC: 40.0000 mg | DELAYED_RELEASE_TABLET | Freq: Every day | ORAL | Status: DC

## 2011-11-25 MED ORDER — SODIUM CHLORIDE 0.9 % IV SOLN: 500.0000 mL | INTRAVENOUS | Status: DC

## 2011-11-25 NOTE — Op Note (Signed)
[facility] [address]. [place], [place]  [id number]  ENDOSCOPY PROCEDURE REPORT  PATIENT:  [name], [name]  MR#:  [id number] BIRTHDATE:  07-21-1950, 60 yrs. old  GENDER:  female ENDOSCOPIST:  Lajuan Lines. Etoy Mcdonnell, MD  PROCEDURE DATE:  11/25/2011 PROCEDURE:  EGD with biopsy, 43239 ASA CLASS:  Class III INDICATIONS:  anemia MEDICATIONS:  MAC sedation, administered by CRNA, propofol (Diprivan) 150 mg IV TOPICAL ANESTHETIC:  Cetacaine Spray  DESCRIPTION OF PROCEDURE:   After the risks benefits and alternatives of the procedure were thoroughly explained, informed consent was obtained.  The LB GIF-H180 [id number] endoscope was introduced through the mouth and advanced to the second portion of the duodenum, without limitations.  The instrument was slowly withdrawn as the mucosa was fully examined. <<PROCEDUREIMAGES>>  LA Grade B, esophagitis was found at the gastroesophageal junction.  Otherwise normal esophagus.  Mild gastritis with scattered erosions was found antrum. Biopsies of the antrum and body of the stomach were obtained and sent to pathology.  A hiatal hernia was found.  Duodenitis was found in the bulb of the duodenum.  Normal otherwise in the second portion of the duodenum. Retroflexed views revealed a hiatal hernia.    The scope was then withdrawn from the patient and the procedure completed.  COMPLICATIONS:  None  ENDOSCOPIC IMPRESSION: 1) Esophagitis at the gastroesophageal junction 2) Otherwise normal esophagus 3) Mild gastritis in the antrum.  Multiple biopsies obtained. 4) Hiatal hernia 5) Duodenitis in the bulb of duodenum 6) Normal in the second portion duodenum  RECOMMENDATIONS: 1) Await pathology results 2) avoid NSAIDS 3) follow-up of helicobacter pylori status, treat if indicated 4) Begin pantoprazole 40 mg daily for acid suppression and to treat the inflammation seen today.  Lajuan Lines. Hilarie Fredrickson, MD  CC:  [facility] Dr.  Jarold Song  n. eSIGNED:   Lajuan Lines. Maricia Scotti at 11/25/2011 10:21 AM  [name], [id number]

## 2011-11-25 NOTE — Op Note (Signed)
[facility] [address]. [place], [place]  [id number]  COLONOSCOPY PROCEDURE REPORT  PATIENT:  [name], [name]  MR#:  [id number] BIRTHDATE:  1950-08-16, 77 yrs. old  GENDER:  female ENDOSCOPIST:  Lajuan Lines. Altus Zaino, MD  PROCEDURE DATE:  11/25/2011 PROCEDURE:  Colonoscopy with snare polypectomy, Colon with cold biopsy polypectomy ASA CLASS:  Class III INDICATIONS:  Anemia, Screening MEDICATIONS:   MAC sedation, administered by CRNA, propofol (Diprivan) 450 mg IV  DESCRIPTION OF PROCEDURE:   After the risks benefits and alternatives of the procedure were thoroughly explained, informed consent was obtained.  Digital rectal exam was performed and revealed no rectal masses.   The LB PCF-H180AL [id number] endoscope was introduced through the anus and advanced to the cecum, which was identified by both the appendix and ileocecal valve, without limitations.  The quality of the prep was good, using MoviPrep. The instrument was then slowly withdrawn as the colon was fully examined. <<PROCEDUREIMAGES>>  FINDINGS:  A 2 cm sessile polyp was found in the ascending colon. Polyp was snared in piecemeal fashion both in forward and retroflexed view, then cauterized with monopolar cautery. Retrieval was successful.  Polypectomy site was tattooed with 1 cc [place] ink.  A 5 mm sessile polyp was found in the ascending colon. Polyp was snared without cautery. Retrieval was successful.  A 1 mm sessile polyp was found in the ascending colon. The polyp was removed using cold biopsy forceps.  Four sessile polyps measuring 4 - 7 mm were found in the transverse colon. Polyps were snared without cautery. Retrieval was successful.  Mild diverticulosis was found in the left colon.   Retroflexed views in the rectum revealed no abnormalities.   The scope was then withdrawn  from the cecum and the procedure completed.  COMPLICATIONS:  None  ENDOSCOPIC IMPRESSION: 1) Sessile polyp in the ascending colon.  Removed and sent to pathology.  Tattoo placed. 2) 2 additional sessile polyps in the ascending colon. Removed and sent to pathology. 3) Four polyps in the transverse colon 4) Mild diverticulosis in the left colon  RECOMMENDATIONS: 1) Hold aspirin, aspirin products, and anti-inflammatory medication for 2 weeks. 2) Await pathology results 3) High fiber diet. 4) Repeat Colonoscopy in 6 months given piecemeal polypectomy. 5) You will receive a letter within 1-2 weeks with the results of your biopsy as well as final recommendations. Please call my office if you have not received a letter after 3 weeks.  Lajuan Lines. Aroldo Galli, MD  CC:  The Patient Dr. Jarold Song  n. eSIGNED:   Lajuan Lines. Mercedies Ganesh at 11/25/2011 10:29 AM  [name], [id number]

## 2011-11-25 NOTE — Patient Instructions (Addendum)
YOU HAD AN ENDOSCOPIC PROCEDURE TODAY AT THE [facility]: Refer to the procedure report that was given to you for any specific questions about what was found during the examination.  If the procedure report does not answer your questions, please call your gastroenterologist to clarify.  If you requested that your care partner not be given the details of your procedure findings, then the procedure report has been included in a sealed envelope for you to review at your convenience later.  YOU SHOULD EXPECT: Some feelings of bloating in the abdomen. Passage of more gas than usual.  Walking can help get rid of the air that was put into your GI tract during the procedure and reduce the bloating. If you had a lower endoscopy (such as a colonoscopy or flexible sigmoidoscopy) you may notice spotting of blood in your stool or on the toilet paper. If you underwent a bowel prep for your procedure, then you may not have a normal bowel movement for a few days.  DIET: Your first meal following the procedure should be a light meal and then it is ok to progress to your normal diet.  A half-sandwich or bowl of soup is an example of a good first meal.  Heavy or fried foods are harder to digest and may make you feel nauseous or bloated.  Likewise meals heavy in dairy and vegetables can cause extra gas to form and this can also increase the bloating.  Drink plenty of fluids but you should avoid alcoholic beverages for 24 hours.  ACTIVITY: Your care partner should take you home directly after the procedure.  You should plan to take it easy, moving slowly for the rest of the day.  You can resume normal activity the day after the procedure however you should NOT DRIVE or use heavy machinery for 24 hours (because of the sedation medicines used during the test).    SYMPTOMS TO REPORT IMMEDIATELY: A gastroenterologist can be reached at any hour.  During normal business hours, 8:30 AM to 5:00 PM Monday through Friday,  call [phone].  After hours and on weekends, please call the GI answering service at [phone] who will take a message and have the physician on call contact you.   Following lower endoscopy (colonoscopy or flexible sigmoidoscopy):  Excessive amounts of blood in the stool  Significant tenderness or worsening of abdominal pains  Swelling of the abdomen that is new, acute  Fever of 100F or higher  Following upper endoscopy (EGD)  Vomiting of blood or coffee ground material  New chest pain or pain under the shoulder blades  Painful or persistently difficult swallowing  New shortness of breath  Fever of 100F or higher  Black, tarry-looking stools  FOLLOW UP: If any biopsies were taken you will be contacted by phone or by letter within the next 1-3 weeks.  Call your gastroenterologist if you have not heard about the biopsies in 3 weeks.  Our staff will call the home number listed on your records the next business day following your procedure to check on you and address any questions or concerns that you may have at that time regarding the information given to you following your procedure. This is a courtesy call and so if there is no answer at the home number and we have not heard from you through the emergency physician on call, we will assume that you have returned to your regular daily activities without incident.  SIGNATURES/CONFIDENTIALITY: You and/or your care   partner have signed paperwork which will be entered into your electronic medical record.  These signatures attest to the fact that that the information above on your After Visit Summary has been reviewed and is understood.  Full responsibility of the confidentiality of this discharge information lies with you and/or your care-partner.   HOLD ASPIRIN, ASPIRIN-CONTAINING PRODUCTS, AND NSAIDS FOR 2 WEEKS  AVOIDS NSAIDS (ANTI-INFLAMMATORIES)  FOLLOW A HIGH FIBER DIET- SEE HANDOUT  REPEAT COLONOSCOPY IN 6 SIX MONTHS- YOU  WILL RECEIVE A REMINDER LETTER AT THE APPROPRIATE TIME

## 2011-11-25 NOTE — Progress Notes (Addendum)
Patient did not experience any of the following events: a burn prior to discharge; a fall within the facility; wrong site/side/patient/procedure/implant event; or a hospital transfer or hospital admission upon discharge from the facility. [phone]) Patient did not have preoperative order for IV antibiotic SSI prophylaxis. [phone])   Reviewed pt's history and with Dr. Hilarie Fredrickson- she is no longer taking Coumadin but is on ASA therapy now.  She and husband are told to hold for 2 weeks, along with any NSAIDs per D.O,

## 2011-11-28 ENCOUNTER — Telehealth: Payer: Self-pay | Admitting: *Deleted

## 2011-11-28 NOTE — Telephone Encounter (Signed)
Left message on callback for f/u

## 2011-12-04 ENCOUNTER — Encounter: Payer: Self-pay | Admitting: Internal Medicine

## 2012-01-12 ENCOUNTER — Ambulatory Visit: Payer: Self-pay | Admitting: Cardiology

## 2012-01-12 DIAGNOSIS — G459 Transient cerebral ischemic attack, unspecified: Secondary | ICD-10-CM

## 2012-01-12 DIAGNOSIS — Z7901 Long term (current) use of anticoagulants: Secondary | ICD-10-CM

## 2012-04-27 ENCOUNTER — Telehealth: Payer: Self-pay | Admitting: *Deleted

## 2012-04-27 NOTE — Telephone Encounter (Signed)
Called pt to remind her of Recall COLON and to ask if she wants to schedule it; pt asks that I call her next week.

## 2012-04-27 NOTE — Telephone Encounter (Signed)
Message copied by Lance Morin on Fri Apr 27, 2012  1:48 PM ------      Message from: Lance Morin      Created: Mon Dec 05, 2011  9:38 AM       Needs recall colon in 6 months

## 2012-04-30 ENCOUNTER — Encounter: Payer: Self-pay | Admitting: Internal Medicine

## 2012-07-26 ENCOUNTER — Encounter: Payer: Self-pay | Admitting: Internal Medicine

## 2012-07-26 NOTE — Telephone Encounter (Signed)
Patient's husband will have her call tomorrow.  I have mailed another recall letter to her home incase she doesn't remember to call or doesn't get the message

## 2012-07-26 NOTE — Telephone Encounter (Signed)
Left message for patient to call back  

## 2012-07-27 ENCOUNTER — Encounter: Payer: Self-pay | Admitting: Internal Medicine

## 2012-08-17 ENCOUNTER — Ambulatory Visit (AMBULATORY_SURGERY_CENTER): Payer: Medicare Other | Admitting: *Deleted

## 2012-08-17 VITALS — Ht 67.75 in | Wt 240.0 lb

## 2012-08-17 DIAGNOSIS — Z1211 Encounter for screening for malignant neoplasm of colon: Secondary | ICD-10-CM

## 2012-08-17 MED ORDER — MOVIPREP 100 G PO SOLR: ORAL | Status: DC

## 2012-08-31 ENCOUNTER — Encounter: Payer: Medicaid Other | Admitting: Internal Medicine

## 2012-10-10 ENCOUNTER — Telehealth: Payer: Self-pay | Admitting: Internal Medicine

## 2012-10-10 DIAGNOSIS — K2990 Gastroduodenitis, unspecified, without bleeding: Secondary | ICD-10-CM

## 2012-10-10 MED ORDER — PANTOPRAZOLE SODIUM 40 MG PO TBEC: 40.0000 mg | DELAYED_RELEASE_TABLET | Freq: Every day | ORAL | Status: DC

## 2012-10-10 NOTE — Telephone Encounter (Signed)
Sent pantoprazole to pt's pharmacy

## 2012-10-12 ENCOUNTER — Ambulatory Visit
Admission: RE | Admit: 2012-10-12 | Discharge: 2012-10-12 | Disposition: A | Discharge status: Home / Self Care | Payer: Medicare Other | Source: Ambulatory Visit | Attending: Orthopedic Surgery | Admitting: Orthopedic Surgery

## 2012-10-12 ENCOUNTER — Other Ambulatory Visit: Payer: Self-pay | Admitting: Orthopedic Surgery

## 2012-10-12 DIAGNOSIS — M25512 Pain in left shoulder: Secondary | ICD-10-CM

## 2012-10-12 DIAGNOSIS — M25511 Pain in right shoulder: Secondary | ICD-10-CM

## 2012-10-12 DIAGNOSIS — M549 Dorsalgia, unspecified: Secondary | ICD-10-CM

## 2012-12-07 ENCOUNTER — Encounter: Payer: Self-pay | Admitting: Internal Medicine

## 2013-02-16 ENCOUNTER — Emergency Department (HOSPITAL_COMMUNITY): Payer: Medicare Other

## 2013-02-16 ENCOUNTER — Inpatient Hospital Stay (HOSPITAL_COMMUNITY)
Admission: EM | Admit: 2013-02-16 | Discharge: 2013-02-20 | DRG: 193 | Disposition: A | Discharge status: Home / Self Care | Payer: Medicare Other | Attending: Internal Medicine | Admitting: Internal Medicine

## 2013-02-16 ENCOUNTER — Encounter (HOSPITAL_COMMUNITY): Payer: Self-pay | Admitting: *Deleted

## 2013-02-16 DIAGNOSIS — Z791 Long term (current) use of non-steroidal anti-inflammatories (NSAID): Secondary | ICD-10-CM

## 2013-02-16 DIAGNOSIS — F3289 Other specified depressive episodes: Secondary | ICD-10-CM | POA: Diagnosis present

## 2013-02-16 DIAGNOSIS — I2510 Atherosclerotic heart disease of native coronary artery without angina pectoris: Secondary | ICD-10-CM | POA: Diagnosis present

## 2013-02-16 DIAGNOSIS — Z9889 Other specified postprocedural states: Secondary | ICD-10-CM

## 2013-02-16 DIAGNOSIS — D631 Anemia in chronic kidney disease: Secondary | ICD-10-CM | POA: Diagnosis present

## 2013-02-16 DIAGNOSIS — J9801 Acute bronchospasm: Secondary | ICD-10-CM | POA: Diagnosis present

## 2013-02-16 DIAGNOSIS — H9220 Otorrhagia, unspecified ear: Secondary | ICD-10-CM | POA: Diagnosis present

## 2013-02-16 DIAGNOSIS — Z8673 Personal history of transient ischemic attack (TIA), and cerebral infarction without residual deficits: Secondary | ICD-10-CM

## 2013-02-16 DIAGNOSIS — F329 Major depressive disorder, single episode, unspecified: Secondary | ICD-10-CM | POA: Diagnosis present

## 2013-02-16 DIAGNOSIS — H9210 Otorrhea, unspecified ear: Secondary | ICD-10-CM | POA: Diagnosis present

## 2013-02-16 DIAGNOSIS — Z951 Presence of aortocoronary bypass graft: Secondary | ICD-10-CM

## 2013-02-16 DIAGNOSIS — R0902 Hypoxemia: Secondary | ICD-10-CM

## 2013-02-16 DIAGNOSIS — E119 Type 2 diabetes mellitus without complications: Secondary | ICD-10-CM | POA: Diagnosis present

## 2013-02-16 DIAGNOSIS — D509 Iron deficiency anemia, unspecified: Secondary | ICD-10-CM | POA: Diagnosis present

## 2013-02-16 DIAGNOSIS — H9209 Otalgia, unspecified ear: Secondary | ICD-10-CM | POA: Diagnosis present

## 2013-02-16 DIAGNOSIS — H269 Unspecified cataract: Secondary | ICD-10-CM | POA: Diagnosis present

## 2013-02-16 DIAGNOSIS — I10 Essential (primary) hypertension: Secondary | ICD-10-CM | POA: Diagnosis present

## 2013-02-16 DIAGNOSIS — K219 Gastro-esophageal reflux disease without esophagitis: Secondary | ICD-10-CM | POA: Diagnosis present

## 2013-02-16 DIAGNOSIS — J9601 Acute respiratory failure with hypoxia: Secondary | ICD-10-CM | POA: Diagnosis present

## 2013-02-16 DIAGNOSIS — E7800 Pure hypercholesterolemia, unspecified: Secondary | ICD-10-CM | POA: Diagnosis present

## 2013-02-16 DIAGNOSIS — J189 Pneumonia, unspecified organism: Principal | ICD-10-CM | POA: Diagnosis present

## 2013-02-16 DIAGNOSIS — M129 Arthropathy, unspecified: Secondary | ICD-10-CM | POA: Diagnosis present

## 2013-02-16 DIAGNOSIS — Z87891 Personal history of nicotine dependence: Secondary | ICD-10-CM

## 2013-02-16 DIAGNOSIS — Z7982 Long term (current) use of aspirin: Secondary | ICD-10-CM

## 2013-02-16 DIAGNOSIS — E785 Hyperlipidemia, unspecified: Secondary | ICD-10-CM | POA: Diagnosis present

## 2013-02-16 DIAGNOSIS — Z79899 Other long term (current) drug therapy: Secondary | ICD-10-CM

## 2013-02-16 DIAGNOSIS — J9600 Acute respiratory failure, unspecified whether with hypoxia or hypercapnia: Secondary | ICD-10-CM | POA: Diagnosis present

## 2013-02-16 DIAGNOSIS — G4730 Sleep apnea, unspecified: Secondary | ICD-10-CM | POA: Diagnosis present

## 2013-02-16 LAB — CBC WITH DIFFERENTIAL/PLATELET
Basophils Absolute: 0 10*3/uL (ref 0.0–0.1)
Basophils Relative: 0 % (ref 0–1)
Basophils Relative: 0 % (ref 0–1)
Eosinophils Absolute: 0.1 10*3/uL (ref 0.0–0.7)
Hemoglobin: 11.4 g/dL — ABNORMAL LOW (ref 12.0–15.0)
Hemoglobin: 11.5 g/dL — ABNORMAL LOW (ref 12.0–15.0)
MCH: 26.6 pg (ref 26.0–34.0)
MCHC: 31.9 g/dL (ref 30.0–36.0)
MCHC: 32 g/dL (ref 30.0–36.0)
Monocytes Relative: 9 % (ref 3–12)
Monocytes Relative: 6 % (ref 3–12)
Neutro Abs: 5.2 10*3/uL (ref 1.7–7.7)
Neutrophils Relative %: 58 % (ref 43–77)
Neutrophils Relative %: 61 % (ref 43–77)
Platelets: 264 10*3/uL (ref 150–400)
Platelets: 256 10*3/uL (ref 150–400)

## 2013-02-16 LAB — EXPECTORATED SPUTUM ASSESSMENT W GRAM STAIN, RFLX TO RESP C

## 2013-02-16 LAB — COMPREHENSIVE METABOLIC PANEL
Albumin: 3.2 g/dL — ABNORMAL LOW (ref 3.5–5.2)
BUN: 9 mg/dL (ref 6–23)
Chloride: 100 mEq/L (ref 96–112)
Creatinine, Ser: 0.96 mg/dL (ref 0.50–1.10)
GFR calc Af Amer: 71 mL/min — ABNORMAL LOW (ref >90)
Total Bilirubin: 0.4 mg/dL (ref 0.3–1.2)
Total Protein: 7.4 g/dL (ref 6.0–8.3)

## 2013-02-16 LAB — MAGNESIUM: Magnesium: 2.4 mg/dL (ref 1.5–2.5)

## 2013-02-16 LAB — BASIC METABOLIC PANEL
BUN: 11 mg/dL (ref 6–23)
GFR calc Af Amer: 68 mL/min — ABNORMAL LOW (ref >90)
GFR calc non Af Amer: 59 mL/min — ABNORMAL LOW (ref >90)
Potassium: 4.2 mEq/L (ref 3.5–5.1)

## 2013-02-16 LAB — PHOSPHORUS: Phosphorus: 3 mg/dL (ref 2.3–4.6)

## 2013-02-16 LAB — PROTIME-INR
INR: 0.9 (ref 0.00–1.49)
Prothrombin Time: 12 seconds (ref 11.6–15.2)

## 2013-02-16 LAB — APTT: aPTT: 28 seconds (ref 24–37)

## 2013-02-16 LAB — STREP PNEUMONIAE URINARY ANTIGEN: Strep Pneumo Urinary Antigen: NEGATIVE

## 2013-02-16 MED ORDER — ALBUTEROL SULFATE (5 MG/ML) 0.5% IN NEBU
5.0000 mg | INHALATION_SOLUTION | RESPIRATORY_TRACT | Status: AC
  Administered 2013-02-16 (×3): 5 mg via RESPIRATORY_TRACT
  Filled 2013-02-16 (×3): qty 1

## 2013-02-16 MED ORDER — ASPIRIN EC 81 MG PO TBEC
81.0000 mg | DELAYED_RELEASE_TABLET | Freq: Every day | ORAL | Status: DC
  Administered 2013-02-16 – 2013-02-20 (×5): 81 mg via ORAL
  Filled 2013-02-16 (×5): qty 1

## 2013-02-16 MED ORDER — DEXTROSE 5 % IV SOLN
1.0000 g | Freq: Once | INTRAVENOUS | Status: AC
  Administered 2013-02-16: 1 g via INTRAVENOUS
  Filled 2013-02-16: qty 10

## 2013-02-16 MED ORDER — GUAIFENESIN ER 600 MG PO TB12
1200.0000 mg | ORAL_TABLET | Freq: Once | ORAL | Status: AC
  Administered 2013-02-16: 1200 mg via ORAL
  Filled 2013-02-16: qty 2

## 2013-02-16 MED ORDER — AMLODIPINE BESYLATE 10 MG PO TABS
10.0000 mg | ORAL_TABLET | Freq: Every day | ORAL | Status: DC
  Administered 2013-02-16 – 2013-02-20 (×5): 10 mg via ORAL
  Filled 2013-02-16 (×5): qty 1

## 2013-02-16 MED ORDER — VITAMIN D 1000 UNITS PO TABS
2000.0000 [IU] | ORAL_TABLET | Freq: Every day | ORAL | Status: DC
  Administered 2013-02-16 – 2013-02-20 (×5): 2000 [IU] via ORAL
  Filled 2013-02-16 (×5): qty 2

## 2013-02-16 MED ORDER — ACETAMINOPHEN 650 MG RE SUPP: 650.0000 mg | Freq: Four times a day (QID) | RECTAL | Status: DC | PRN

## 2013-02-16 MED ORDER — ETODOLAC ER 400 MG PO TB24: 400.0000 mg | ORAL_TABLET | Freq: Two times a day (BID) | ORAL | Status: DC

## 2013-02-16 MED ORDER — SODIUM CHLORIDE 0.9 % IV SOLN
1000.0000 mL | Freq: Once | INTRAVENOUS | Status: AC
  Administered 2013-02-16: 1000 mL via INTRAVENOUS

## 2013-02-16 MED ORDER — SODIUM CHLORIDE 0.9 % IV SOLN
1000.0000 mL | INTRAVENOUS | Status: DC
  Administered 2013-02-17 – 2013-02-20 (×5): 1000 mL via INTRAVENOUS

## 2013-02-16 MED ORDER — ONDANSETRON HCL 4 MG/2ML IJ SOLN: 4.0000 mg | Freq: Four times a day (QID) | INTRAMUSCULAR | Status: DC | PRN

## 2013-02-16 MED ORDER — ONDANSETRON HCL 4 MG PO TABS: 4.0000 mg | ORAL_TABLET | Freq: Four times a day (QID) | ORAL | Status: DC | PRN

## 2013-02-16 MED ORDER — ETODOLAC 200 MG PO CAPS
200.0000 mg | ORAL_CAPSULE | Freq: Four times a day (QID) | ORAL | Status: DC
  Administered 2013-02-16 – 2013-02-20 (×16): 200 mg via ORAL
  Filled 2013-02-16 (×20): qty 1

## 2013-02-16 MED ORDER — HYDROCODONE-ACETAMINOPHEN 5-325 MG PO TABS
1.0000 | ORAL_TABLET | ORAL | Status: DC | PRN
  Administered 2013-02-16: 1 via ORAL
  Administered 2013-02-17: 2 via ORAL
  Filled 2013-02-16: qty 2
  Filled 2013-02-16: qty 1

## 2013-02-16 MED ORDER — ALBUTEROL SULFATE (5 MG/ML) 0.5% IN NEBU
5.0000 mg | INHALATION_SOLUTION | Freq: Once | RESPIRATORY_TRACT | Status: AC
  Administered 2013-02-16: 5 mg via RESPIRATORY_TRACT
  Filled 2013-02-16: qty 1

## 2013-02-16 MED ORDER — DEXTROSE 5 % IV SOLN
1.0000 g | INTRAVENOUS | Status: DC
  Administered 2013-02-17 – 2013-02-20 (×4): 1 g via INTRAVENOUS
  Filled 2013-02-16 (×4): qty 10

## 2013-02-16 MED ORDER — IPRATROPIUM BROMIDE 0.02 % IN SOLN
0.5000 mg | RESPIRATORY_TRACT | Status: DC
  Administered 2013-02-16 (×3): 0.5 mg via RESPIRATORY_TRACT
  Filled 2013-02-16: qty 7.5
  Filled 2013-02-16: qty 2.5

## 2013-02-16 MED ORDER — LOSARTAN POTASSIUM 50 MG PO TABS
100.0000 mg | ORAL_TABLET | Freq: Every day | ORAL | Status: DC
  Administered 2013-02-16 – 2013-02-20 (×5): 100 mg via ORAL
  Filled 2013-02-16 (×5): qty 2

## 2013-02-16 MED ORDER — AZITHROMYCIN 250 MG PO TABS
500.0000 mg | ORAL_TABLET | Freq: Once | ORAL | Status: AC
  Administered 2013-02-16: 500 mg via ORAL
  Filled 2013-02-16: qty 2

## 2013-02-16 MED ORDER — GUAIFENESIN ER 600 MG PO TB12
1200.0000 mg | ORAL_TABLET | Freq: Two times a day (BID) | ORAL | Status: DC
  Administered 2013-02-16 – 2013-02-20 (×9): 1200 mg via ORAL
  Filled 2013-02-16 (×10): qty 2

## 2013-02-16 MED ORDER — ATORVASTATIN CALCIUM 20 MG PO TABS
20.0000 mg | ORAL_TABLET | Freq: Every day | ORAL | Status: DC
  Administered 2013-02-16 – 2013-02-18 (×3): 20 mg via ORAL
  Filled 2013-02-16 (×5): qty 1

## 2013-02-16 MED ORDER — DEXTROSE 5 % IV SOLN
500.0000 mg | INTRAVENOUS | Status: DC
  Administered 2013-02-17 – 2013-02-18 (×2): 500 mg via INTRAVENOUS
  Filled 2013-02-16 (×2): qty 500

## 2013-02-16 MED ORDER — ADULT MULTIVITAMIN W/MINERALS CH
1.0000 | ORAL_TABLET | Freq: Every day | ORAL | Status: DC
  Administered 2013-02-16 – 2013-02-20 (×5): 1 via ORAL
  Filled 2013-02-16 (×5): qty 1

## 2013-02-16 MED ORDER — IPRATROPIUM BROMIDE 0.02 % IN SOLN: 0.5000 mg | RESPIRATORY_TRACT | Status: DC | PRN

## 2013-02-16 MED ORDER — ALBUTEROL SULFATE (5 MG/ML) 0.5% IN NEBU
2.5000 mg | INHALATION_SOLUTION | Freq: Two times a day (BID) | RESPIRATORY_TRACT | Status: DC
  Administered 2013-02-17 – 2013-02-20 (×7): 2.5 mg via RESPIRATORY_TRACT
  Filled 2013-02-16 (×6): qty 0.5

## 2013-02-16 MED ORDER — METOCLOPRAMIDE HCL 10 MG PO TABS
10.0000 mg | ORAL_TABLET | Freq: Three times a day (TID) | ORAL | Status: DC
  Administered 2013-02-16 – 2013-02-20 (×13): 10 mg via ORAL
  Filled 2013-02-16 (×17): qty 1

## 2013-02-16 MED ORDER — DEXTROSE 5 % IV SOLN: 1.0000 g | INTRAVENOUS | Status: DC

## 2013-02-16 MED ORDER — ALBUTEROL SULFATE (5 MG/ML) 0.5% IN NEBU: 2.5000 mg | INHALATION_SOLUTION | RESPIRATORY_TRACT | Status: DC | PRN

## 2013-02-16 MED ORDER — LINAGLIPTIN 5 MG PO TABS
5.0000 mg | ORAL_TABLET | Freq: Every day | ORAL | Status: DC
  Administered 2013-02-16 – 2013-02-20 (×5): 5 mg via ORAL
  Filled 2013-02-16 (×5): qty 1

## 2013-02-16 MED ORDER — IPRATROPIUM BROMIDE 0.02 % IN SOLN
0.5000 mg | Freq: Once | RESPIRATORY_TRACT | Status: AC
  Administered 2013-02-16: 0.5 mg via RESPIRATORY_TRACT
  Filled 2013-02-16: qty 2.5

## 2013-02-16 MED ORDER — METFORMIN HCL 850 MG PO TABS
850.0000 mg | ORAL_TABLET | Freq: Two times a day (BID) | ORAL | Status: DC
  Administered 2013-02-16 – 2013-02-20 (×8): 850 mg via ORAL
  Filled 2013-02-16 (×10): qty 1

## 2013-02-16 MED ORDER — SODIUM CHLORIDE 0.9 % IV SOLN: INTRAVENOUS | Status: DC

## 2013-02-16 MED ORDER — IPRATROPIUM BROMIDE 0.02 % IN SOLN
RESPIRATORY_TRACT | Status: AC
  Filled 2013-02-16: qty 2.5

## 2013-02-16 MED ORDER — ZOLPIDEM TARTRATE 5 MG PO TABS
5.0000 mg | ORAL_TABLET | Freq: Once | ORAL | Status: AC
  Administered 2013-02-17: 5 mg via ORAL
  Filled 2013-02-16: qty 1

## 2013-02-16 MED ORDER — ACETAMINOPHEN 325 MG PO TABS: 650.0000 mg | ORAL_TABLET | Freq: Four times a day (QID) | ORAL | Status: DC | PRN

## 2013-02-16 MED ORDER — PANTOPRAZOLE SODIUM 40 MG PO TBEC
40.0000 mg | DELAYED_RELEASE_TABLET | Freq: Every day | ORAL | Status: DC
  Administered 2013-02-16 – 2013-02-20 (×5): 40 mg via ORAL
  Filled 2013-02-16 (×5): qty 1

## 2013-02-16 MED ORDER — HYDRALAZINE HCL 20 MG/ML IJ SOLN
10.0000 mg | Freq: Four times a day (QID) | INTRAMUSCULAR | Status: DC | PRN
  Administered 2013-02-17: 10 mg via INTRAVENOUS
  Filled 2013-02-16: qty 1

## 2013-02-16 MED ORDER — IPRATROPIUM BROMIDE 0.02 % IN SOLN
0.5000 mg | Freq: Two times a day (BID) | RESPIRATORY_TRACT | Status: DC
  Administered 2013-02-17 – 2013-02-20 (×7): 0.5 mg via RESPIRATORY_TRACT
  Filled 2013-02-16 (×6): qty 2.5

## 2013-02-16 MED ORDER — INDAPAMIDE 1.25 MG PO TABS
1.2500 mg | ORAL_TABLET | Freq: Every morning | ORAL | Status: DC
  Administered 2013-02-16 – 2013-02-20 (×5): 1.25 mg via ORAL
  Filled 2013-02-16 (×6): qty 1

## 2013-02-16 NOTE — ED Notes (Signed)
Attempting to PIV before sending her upstairs.  Already attempted 2X
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
## 2013-02-16 NOTE — ED Notes (Addendum)
Incessant coughing.with brown puss per patient. Warm tea given to sooth throat

## 2013-02-16 NOTE — ED Notes (Signed)
Ambulated around unit. O2 saturation down to 88% with HR 111.

## 2013-02-16 NOTE — ED Notes (Signed)
Receiving breathing treatment 

## 2013-02-16 NOTE — ED Notes (Signed)
Pt has been having cough, chills, body aches,  Nasal congestion and coughing up "pus with blood"
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
## 2013-02-17 DIAGNOSIS — E119 Type 2 diabetes mellitus without complications: Secondary | ICD-10-CM

## 2013-02-17 DIAGNOSIS — H9220 Otorrhagia, unspecified ear: Secondary | ICD-10-CM | POA: Diagnosis present

## 2013-02-17 LAB — LEGIONELLA ANTIGEN, URINE: Legionella Antigen, Urine: NEGATIVE

## 2013-02-17 LAB — CBC
HCT: 31.5 % — ABNORMAL LOW (ref 36.0–46.0)
Hemoglobin: 9.7 g/dL — ABNORMAL LOW (ref 12.0–15.0)
MCH: 25.8 pg — ABNORMAL LOW (ref 26.0–34.0)
MCHC: 30.8 g/dL (ref 30.0–36.0)
RDW: 14.3 % (ref 11.5–15.5)

## 2013-02-17 LAB — COMPREHENSIVE METABOLIC PANEL
ALT: 6 U/L (ref 0–35)
AST: 9 U/L (ref 0–37)
Alkaline Phosphatase: 62 U/L (ref 39–117)
CO2: 26 mEq/L (ref 19–32)
GFR calc Af Amer: 62 mL/min — ABNORMAL LOW (ref >90)
GFR calc non Af Amer: 53 mL/min — ABNORMAL LOW (ref >90)
Glucose, Bld: 165 mg/dL — ABNORMAL HIGH (ref 70–99)
Potassium: 4.3 mEq/L (ref 3.5–5.1)
Sodium: 137 mEq/L (ref 135–145)

## 2013-02-17 LAB — URINE CULTURE

## 2013-02-17 LAB — GLUCOSE, CAPILLARY: Glucose-Capillary: 153 mg/dL — ABNORMAL HIGH (ref 70–99)

## 2013-02-17 MED ORDER — ZOLPIDEM TARTRATE 10 MG PO TABS: 10.0000 mg | ORAL_TABLET | Freq: Every evening | ORAL | Status: DC | PRN

## 2013-02-17 MED ORDER — MORPHINE SULFATE 2 MG/ML IJ SOLN: 1.0000 mg | INTRAMUSCULAR | Status: DC | PRN

## 2013-02-17 MED ORDER — ZOLPIDEM TARTRATE 5 MG PO TABS
5.0000 mg | ORAL_TABLET | Freq: Every evening | ORAL | Status: DC | PRN
  Administered 2013-02-17 – 2013-02-19 (×3): 5 mg via ORAL
  Filled 2013-02-17 (×3): qty 1
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
## 2013-02-17 NOTE — Progress Notes (Signed)
[facility] PROGRESS NOTE  [name] [id number] DOB: Jul 03, 1950 DOA: 02/16/2013 PCP: No primary provider on file.  Brief narrative: 62 year old female with past medical history of hypertension, dyslipidemia and diabetes who presented to [facility] ED with complaints of ongoing cough for past 1 week prior to this admission. Patient reported having cough productive of brownish sputum with subjective fever and chills.  In ED, BP was 197/83 but it did come down to 170/76. Pt did not take morning medications but they were started once she was admitted. T max was 62F. O2 saturation was 93% on room air but it dropped to 88% on ambulation. BMP was unremarkable. CBC revealed only mild drop in hemoglobin at 11.4. CXR was significant for scattered patchy right upper lobe airspace opacities suggesting possible early pneumonia. Pt was started on azithromycin and ceftriaxone in ED.   Assessment and Plan:   Principal Problem:  Acute respiratory failure with hypoxia  - possibly related to community acquired pneumonia; pneumonia order set in place - continue current antibiotics, azithromycin and rocephin started in ED  - continue albuterol and atrovent every 2 hours PRN and every 4 hours scheduled; pt reported significant relief with nebulizer treatments - oxygen support via nasal canula to keep O2 saturation above 90%; O2 saturation 95% this am - mucinex every 12 hours  - follow up blood culture, respiratory culture, legionella results; strep pneumoniae negative  Active Problems:  DM w/o Complication Type II  - 123456 7.4 on this admission indicating poor glycemic control - continue metformin; replace januvia with tradjenta while in hospital  ANEMIA-IRON DEFICIENCY  - hemoglobin 11.4 on this admission; 9.7 today - no indications for transfusion  - continue to monitor Unspecified essential hypertension  - continue cozaar and Norvasc  - hydralazine PRN IV  HYPERLIPIDEMIA  - continue statin therpay   Ear pain/bleeding - unclear if there was a bleed or not but pt reported some ear fullness last night - pain resolved with norco but now has ear pain again so we ordered morphine 1 mg IV every 4 hours PRN severe pain - continue to monitor for signs of bleed  Code Status: full code Family Communication: pt husband at the bedside Disposition Plan: home when stable  [name], MD  [facility] Pager [phone]  If 7PM-7AM, please contact night-coverage [email or web address] Password [facility] 02/17/2013, 8:56 AM   LOS: 1 day   Consultants:  None   Procedures:  None   Antibiotics:  Azithromycin 02/16/2013 -->  Rocephin 02/16/2013 -->  HPI/Subjective: Still congested, coughing but not much sputum coming out.   Objective: Filed Vitals:   02/16/13 1948 02/16/13 2137 02/17/13 0550 02/17/13 0826  BP:  151/66 137/69   Pulse:  100 79   Temp:  98.6 F (37 C) 98.1 F (36.7 C)   TempSrc:  Oral Oral   Resp:  18 16   Height:      Weight:   111.585 kg (246 lb)   SpO2: 95% 97% 97% 95%    Intake/Output Summary (Last 24 hours) at 02/17/13 0856 Last data filed at 02/17/13 0500  Gross per 24 hour  Intake  427.5 ml  Output   2300 ml  Net -1872.5 ml    Exam:   General:  Pt is alert, follows commands appropriately, not in acute distress  Cardiovascular: Regular rate and rhythm, S1/S2, no murmurs, no rubs, no gallops  Respiratory: course breath sounds bilaterally, some wheezing in mid lung lobes  Abdomen: Soft, non tender, non  distended, bowel sounds present, no guarding  Extremities: No edema, pulses DP and PT palpable bilaterally  Neuro: Grossly nonfocal  Data Reviewed: Basic Metabolic Panel:  Recent Labs Lab 02/16/13 0730 02/16/13 1230 02/17/13 0358  NA 135 138 137  K 4.2 4.0 4.3  CL 100 100 104  CO2 26 27 26   GLUCOSE 157* 180* 165*  BUN 11 9 12   CREATININE 1.00 0.96 1.08  CALCIUM 9.5 9.7 8.8  MG  --  2.4  --   PHOS  --  3.0  --    Liver Function Tests:  Recent  Labs Lab 02/16/13 1230 02/17/13 0358  AST 12 9  ALT 8 6  ALKPHOS 75 62  BILITOT 0.4 0.2*  PROT 7.4 6.1  ALBUMIN 3.2* 2.6*   No results found for this basename: LIPASE, AMYLASE,  in the last 168 hours No results found for this basename: AMMONIA,  in the last 168 hours CBC:  Recent Labs Lab 02/16/13 0730 02/16/13 1230 02/17/13 0358  WBC 7.7 8.5 6.9  NEUTROABS 4.4 5.2  --   HGB 11.4* 11.5* 9.7*  HCT 35.7* 35.9* 31.5*  MCV 83.2 83.1 83.8  PLT 264 256 214   Cardiac Enzymes: No results found for this basename: CKTOTAL, CKMB, CKMBINDEX, TROPONINI,  in the last 168 hours BNP: No components found with this basename: POCBNP,  CBG:  Recent Labs Lab 02/17/13 0729  GLUCAP 153*    MRSA PCR SCREENING     Status: None   Collection Time    02/16/13 12:24 PM      Result Value Range Status   MRSA by PCR NEGATIVE  NEGATIVE Final  CULTURE, EXPECTORATED SPUTUM-ASSESSMENT     Status: None   Collection Time    02/16/13  3:51 PM      Result Value Range Status   Specimen Description SPUTUM   Final   Special Requests NONE   Final   Sputum evaluation     Final   Value: THIS SPECIMEN IS ACCEPTABLE. RESPIRATORY CULTURE REPORT TO FOLLOW.   Report Status 02/16/2013 FINAL   Final     Studies: Dg Chest 2 View  02/16/2013   *RADIOLOGY REPORT*  Clinical Data: Cough, wheezing, fever.  CHEST - 2 VIEW  Comparison: 08/07/2011  Findings: Patchy ill-defined airspace opacities in the right upper lobe.  Left lung clear.  Coarse chronic   perihilar interstitial markings.  Heart size normal.  Previous CABG.  No effusion.  IMPRESSION:  1. Scattered patchy right upper lobe air space opacities suggesting possible early pneumonia.   Original Report Authenticated By: D. Vernard Gambles III, MD    Scheduled Meds: . albuterol  2.5 mg Nebulization BID  . amLODipine  10 mg Oral Daily  . aspirin EC  81 mg Oral Daily  . atorvastatin  20 mg Oral q1800  . azithromycin  500 mg Intravenous Q24H  . cefTRIAXone  (ROCEPHIN)  IV  1 g Intravenous Q24H  . cholecalciferol  2,000 Units Oral Daily  . etodolac  200 mg Oral Q6H  . guaiFENesin  1,200 mg Oral BID  . indapamide  1.25 mg Oral q morning - 10a  . ipratropium  0.5 mg Nebulization BID  . linagliptin  5 mg Oral Daily  . losartan  100 mg Oral Daily  . metFORMIN  850 mg Oral BID WC  . metoCLOPramide  10 mg Oral TID AC  . multivitamin with minerals  1 tablet Oral Daily  . pantoprazole  40 mg Oral Daily  Continuous Infusions: . sodium chloride 1,000 mL (02/16/13 1230)

## 2013-02-18 LAB — CBC
Hemoglobin: 9.8 g/dL — ABNORMAL LOW (ref 12.0–15.0)
MCH: 26.7 pg (ref 26.0–34.0)
MCHC: 32.1 g/dL (ref 30.0–36.0)
MCV: 83.1 fL (ref 78.0–100.0)
RBC: 3.67 MIL/uL — ABNORMAL LOW (ref 3.87–5.11)

## 2013-02-18 LAB — GLUCOSE, CAPILLARY: Glucose-Capillary: 104 mg/dL — ABNORMAL HIGH (ref 70–99)

## 2013-02-18 MED ORDER — AZITHROMYCIN 500 MG PO TABS
500.0000 mg | ORAL_TABLET | Freq: Every day | ORAL | Status: DC
  Administered 2013-02-19 – 2013-02-20 (×2): 500 mg via ORAL
  Filled 2013-02-18 (×2): qty 1

## 2013-02-18 MED ORDER — METHYLPREDNISOLONE SODIUM SUCC 125 MG IJ SOLR
60.0000 mg | Freq: Two times a day (BID) | INTRAMUSCULAR | Status: DC
  Administered 2013-02-18 – 2013-02-20 (×4): 60 mg via INTRAVENOUS
  Filled 2013-02-18 (×6): qty 0.96

## 2013-02-18 NOTE — Progress Notes (Signed)
[facility] PROGRESS NOTE  [name] [id number] DOB: 1951-06-20 DOA: 02/16/2013 PCP: No primary provider on file.  Brief narrative: 62 year old female with past medical history of hypertension, dyslipidemia and diabetes who presented to [facility] ED with complaints of ongoing cough for past 1 week prior to this admission. Patient reported having cough productive of brownish sputum with subjective fever and chills.  In ED, BP was 197/83 but it did come down to 170/76. Pt did not take morning medications but they were started once she was admitted. T max was 58F. O2 saturation was 93% on room air but it dropped to 88% on ambulation. BMP was unremarkable. CBC revealed only mild drop in hemoglobin at 11.4. CXR was significant for scattered patchy right upper lobe airspace opacities suggesting possible early pneumonia. Pt was started on azithromycin and ceftriaxone in ED.   Assessment and Plan:   Principal Problem:  Acute respiratory failure with hypoxia  - possibly related to community acquired pneumonia; pneumonia order set in place  - continue current antibiotics, azithromycin and rocephin started in ED  - continue albuterol and atrovent every 2 hours PRN and every 4 hours scheduled - We will add flutter valve as well as Solu-Medrol 60 mg IV every 12 hours; patient has rhonchi on physical exam and lot of congestion unable to clear secretions - oxygen support via nasal canula to keep O2 saturation above 90%; O2 saturation 95% this am  - mucinex every 12 hours  - blood cultures ordered at the time of the admission as a part of pneumonia order set but were not drawn for some reason. Resp culture pending. Legionella and strep pneumoniae negative   Active Problems:  DM w/o Complication Type II  - 123456 7.4 on this admission indicating poor glycemic control  - continue metformin; replaced [place] with tradjenta while in hospital  ANEMIA-IRON DEFICIENCY  - hemoglobin 11.4 on this admission;  9.7 today  - no indications for transfusion  - continue to monitor  Unspecified essential hypertension  - continue cozaar and Norvasc  - BP 134/57 - hydralazine PRN IV  HYPERLIPIDEMIA  - continue statin therpay  Ear pain/bleeding  - unclear if there was a bleed or not but pt reported some ear fullness last night  - pain resolved with norco but now has ear pain again so we ordered morphine 1 mg IV every 4 hours PRN severe pain  - continue to monitor for signs of bleed   Code Status: full code  Family Communication: pt husband at the bedside  Disposition Plan: home when stable   [name], MD  [facility]  Pager [phone]   Consultants:  None  Procedures:  None  Antibiotics:  Azithromycin 02/16/2013 -->  Rocephin 02/16/2013 -->   If 7PM-7AM, please contact night-coverage [email or web address] Password [facility] 02/18/2013, 12:57 PM   LOS: 2 days    HPI/Subjective: No acute overnight events,   Objective: Filed Vitals:   02/17/13 2006 02/17/13 2158 02/18/13 0535 02/18/13 0909  BP:  141/56 134/57   Pulse: 87 89 76   Temp:  98.7 F (37.1 C) 98.4 F (36.9 C)   TempSrc:  Oral Oral   Resp: 17 16 16    Height:      Weight:   111.63 kg (246 lb 1.6 oz)   SpO2: 97% 95% 96% 95%    Intake/Output Summary (Last 24 hours) at 02/18/13 1257 Last data filed at 02/18/13 0656  Gross per 24 hour  Intake 5836.67 ml  Output  2850 ml  Net 2986.67 ml    Exam:   General:  Pt is alert, follows commands appropriately, not in acute distress  Cardiovascular: Regular rate and rhythm, S1/S2, no murmurs, no rubs, no gallops  Respiratory: congested with rhonchi in mid lungs, mild wheezing   Abdomen: Soft, non tender, non distended, bowel sounds present, no guarding  Extremities: No edema, pulses DP and PT palpable bilaterally  Neuro: Grossly nonfocal  Data Reviewed: Basic Metabolic Panel:  Recent Labs Lab 02/16/13 0730 02/16/13 1230 02/17/13 0358  NA 135 138 137  K 4.2 4.0 4.3  CL 100  100 104  CO2 26 27 26   GLUCOSE 157* 180* 165*  BUN 11 9 12   CREATININE 1.00 0.96 1.08  CALCIUM 9.5 9.7 8.8  MG  --  2.4  --   PHOS  --  3.0  --    Liver Function Tests:  Recent Labs Lab 02/16/13 1230 02/17/13 0358  AST 12 9  ALT 8 6  ALKPHOS 75 62  BILITOT 0.4 0.2*  PROT 7.4 6.1  ALBUMIN 3.2* 2.6*   No results found for this basename: LIPASE, AMYLASE,  in the last 168 hours No results found for this basename: AMMONIA,  in the last 168 hours CBC:  Recent Labs Lab 02/16/13 0730 02/16/13 1230 02/17/13 0358 02/18/13 0340  WBC 7.7 8.5 6.9 7.1  NEUTROABS 4.4 5.2  --   --   HGB 11.4* 11.5* 9.7* 9.8*  HCT 35.7* 35.9* 31.5* 30.5*  MCV 83.2 83.1 83.8 83.1  PLT 264 256 214 231   Cardiac Enzymes: No results found for this basename: CKTOTAL, CKMB, CKMBINDEX, TROPONINI,  in the last 168 hours BNP: No components found with this basename: POCBNP,  CBG:  Recent Labs Lab 02/17/13 0729 02/18/13 0737  GLUCAP 153* 104*    MRSA PCR SCREENING     Status: None   Collection Time    02/16/13 12:24 PM      Result Value Range Status   MRSA by PCR NEGATIVE  NEGATIVE Final  CULTURE, EXPECTORATED SPUTUM-ASSESSMENT     Status: None   Collection Time    02/16/13  3:51 PM      Result Value Range Status   Specimen Description SPUTUM   Final   Special Requests NONE   Final   Sputum evaluation     Final   Value: THIS SPECIMEN IS ACCEPTABLE. RESPIRATORY CULTURE REPORT TO FOLLOW.   Report Status 02/16/2013 FINAL   Final  CULTURE, RESPIRATORY (NON-EXPECTORATED)     Status: None   Collection Time    02/16/13  3:51 PM      Result Value Range Status   Specimen Description SPUTUM   Final   Special Requests NONE   Final   Gram Stain     Final   Value: RARE WBC PRESENT, PREDOMINANTLY PMN     RARE SQUAMOUS EPITHELIAL CELLS PRESENT     RARE GRAM POSITIVE COCCI IN PAIRS     Performed at [facility]   Culture     Final   Value: NORMAL OROPHARYNGEAL FLORA     Performed at [facility]   Report Status PENDING   Incomplete  URINE CULTURE     Status: None   Collection Time    02/16/13  5:00 PM      Result Value Range Status   Specimen Description URINE, RANDOM   Final   Special Requests NONE   Final   Culture  Setup  Time     Final   Value: 02/16/2013 21:07     Performed at [place]     Final   Value: NO GROWTH     Performed at [facility]   Culture     Final   Value: NO GROWTH     Performed at [facility]   Report Status 02/17/2013 FINAL   Final     Studies: No results found.  Scheduled Meds: . albuterol  2.5 mg Nebulization BID  . amLODipine  10 mg Oral Daily  . aspirin EC  81 mg Oral Daily  . atorvastatin  20 mg Oral q1800  . azithromycin  500 mg Intravenous Q24H  . cefTRIAXone (ROCEPHIN)  IV  1 g Intravenous Q24H  . cholecalciferol  2,000 Units Oral Daily  . etodolac  200 mg Oral Q6H  . guaiFENesin  1,200 mg Oral BID  . indapamide  1.25 mg Oral q morning - 10a  . ipratropium  0.5 mg Nebulization BID  . linagliptin  5 mg Oral Daily  . losartan  100 mg Oral Daily  . metFORMIN  850 mg Oral BID WC  . methylPREDNISolone (SOLU-MEDROL) injection  60 mg Intravenous Q12H  . metoCLOPramide  10 mg Oral TID AC  . multivitamin with minerals  1 tablet Oral Daily  . pantoprazole  40 mg Oral Daily   Continuous Infusions: . sodium chloride 1,000 mL (02/17/13 1056)

## 2013-02-18 NOTE — Care Management Note (Signed)
   CARE MANAGEMENT NOTE 02/18/2013  Patient:  [name], [name]   Account Number:  [id number]  Date Initiated:  02/18/2013  Documentation initiated by:  Tarita Deshmukh  Subjective/Objective Assessment:   62 yo female admitted with PNA. PTA pt from home. No PCP on record.     Action/Plan:   Home when stable   Anticipated DC Date:     Anticipated DC Plan:  [place]  CM consult      Choice offered to / List presented to:  NA   DME arranged  NA      DME agency  NA     [place] arranged  NA      [place] agency  NA   Status of service:  In process, will continue to follow Medicare Important Message given?   (If response is "NO", the following Medicare IM given date fields will be blank) Date Medicare IM given:   Date Additional Medicare IM given:    Discharge Disposition:    Per UR Regulation:  Reviewed for med. necessity/level of care/duration of stay  If discussed at [place] of Stay Meetings, dates discussed:    Comments:  02/18/13 [place] [id number] Chart reviewed for utilization of services. NO PCP on record. CM to provide pt with PCP resources prior to discharge.

## 2013-02-19 LAB — CULTURE, RESPIRATORY W GRAM STAIN

## 2013-02-19 LAB — GLUCOSE, CAPILLARY

## 2013-02-19 NOTE — Progress Notes (Signed)
[facility] PROGRESS NOTE  [name] [id number] DOB: May 26, 1951 DOA: 02/16/2013 PCP: No primary provider on file.  Brief narrative: 62 year old female with past medical history of hypertension, dyslipidemia and diabetes who presented to [facility] ED with complaints of ongoing cough for past 1 week prior to this admission. Patient reported having cough productive of brownish sputum with subjective fever and chills.  In ED, BP was 197/83 but it did come down to 170/76. Pt did not take morning medications but they were started once she was admitted. T max was 57F. O2 saturation was 93% on room air but it dropped to 88% on ambulation. BMP was unremarkable. CBC revealed only mild drop in hemoglobin at 11.4. CXR was significant for scattered patchy right upper lobe airspace opacities suggesting possible early pneumonia. Pt was started on azithromycin and ceftriaxone in ED.   Assessment and Plan:   Principal Problem:  Acute respiratory failure with hypoxia  - Likely secondary to community acquired pneumonia  - We will continue current antibiotics, azithromycin and Rocephin - We added Solu-Medrol 60 mg IV every 12 hours and flutter valve and patient reported significant improvement overnight - Continue to use albuterol and Atrovent every 2 hours as needed as well as every 4 hours schedule - May use nasal cannula to keep oxygen saturation above 90% if needed; patient maintains oxygen saturation at 96% at this time with 2 L nasal cannula  - mucinex every 12 hours  - blood cultures ordered at the time of the admission as a part of pneumonia order set but were not drawn for some reason. Resp culture is acceptable but results still pending. Legionella and strep pneumoniae negative  Active Problems:  DM w/o Complication Type II  - 123456 7.4 on this admission indicating slightly poor glycemic control  - continue metformin; replaced [place] with tradjenta while in hospital  ANEMIA-IRON DEFICIENCY  -  hemoglobin 11.4 on this admission and subsequently 9.7, 9.8 - no indications for transfusion  - continue to monitor  Unspecified essential hypertension  - continue cozaar and Norvasc  - BP 132/81 - hydralazine PRN IV  HYPERLIPIDEMIA  - continue statin therpay  Ear pain/bleeding  - unclear if there was a bleed or not; no reports of bleeding from the ears overnight or this morning - Patient still experiences ear fullness but better since yesterday  Code Status: full code  Family Communication: pt husband at the bedside  Disposition Plan: home when stable   [name], MD  [facility]  Pager [phone]   Consultants:  None  Procedures:  None  Antibiotics:  Azithromycin 02/16/2013 -->  Rocephin 02/16/2013 -->   If 7PM-7AM, please contact night-coverage [email or web address] Password [facility] 02/19/2013, 12:33 PM   LOS: 3 days    HPI/Subjective: Feels somewhat better this am, less congested.  Objective: Filed Vitals:   02/18/13 2117 02/18/13 2151 02/19/13 0631 02/19/13 0851  BP:  176/69 182/81   Pulse: 93 100 87   Temp:  98.1 F (36.7 C) 97.7 F (36.5 C)   TempSrc:  Oral Oral   Resp: 18 18 18    Height:      Weight:      SpO2: 97% 94% 97% 96%    Intake/Output Summary (Last 24 hours) at 02/19/13 1233 Last data filed at 02/19/13 0900  Gross per 24 hour  Intake   2303 ml  Output   4150 ml  Net  -1847 ml    Exam:   General:  Pt is alert,  follows commands appropriately, not in acute distress  Cardiovascular: Regular rate and rhythm, S1/S2, no murmurs, no rubs, no gallops  Respiratory: still congested, rhonchi in mid lung lobes to upper lung lobes  Abdomen: Soft, non tender, non distended, bowel sounds present, no guarding  Extremities: No edema, pulses DP and PT palpable bilaterally  Neuro: Grossly nonfocal  Data Reviewed: Basic Metabolic Panel:  Recent Labs Lab 02/16/13 0730 02/16/13 1230 02/17/13 0358  NA 135 138 137  K 4.2 4.0 4.3  CL 100 100 104  CO2 26 27  26   GLUCOSE 157* 180* 165*  BUN 11 9 12   CREATININE 1.00 0.96 1.08  CALCIUM 9.5 9.7 8.8  MG  --  2.4  --   PHOS  --  3.0  --    Liver Function Tests:  Recent Labs Lab 02/16/13 1230 02/17/13 0358  AST 12 9  ALT 8 6  ALKPHOS 75 62  BILITOT 0.4 0.2*  PROT 7.4 6.1  ALBUMIN 3.2* 2.6*   No results found for this basename: LIPASE, AMYLASE,  in the last 168 hours No results found for this basename: AMMONIA,  in the last 168 hours CBC:  Recent Labs Lab 02/16/13 0730 02/16/13 1230 02/17/13 0358 02/18/13 0340  WBC 7.7 8.5 6.9 7.1  NEUTROABS 4.4 5.2  --   --   HGB 11.4* 11.5* 9.7* 9.8*  HCT 35.7* 35.9* 31.5* 30.5*  MCV 83.2 83.1 83.8 83.1  PLT 264 256 214 231   Cardiac Enzymes: No results found for this basename: CKTOTAL, CKMB, CKMBINDEX, TROPONINI,  in the last 168 hours BNP: No components found with this basename: POCBNP,  CBG:  Recent Labs Lab 02/17/13 0729 02/18/13 0737 02/19/13 0751  GLUCAP 153* 104* 252*    Recent Results (from the past 240 hour(s))  MRSA PCR SCREENING     Status: None   Collection Time    02/16/13 12:24 PM      Result Value Range Status   MRSA by PCR NEGATIVE  NEGATIVE Final   Comment:            The GeneXpert MRSA Assay (FDA     approved for NASAL specimens     only), is one component of a     comprehensive MRSA colonization     surveillance program. It is not     intended to diagnose MRSA     infection nor to guide or     monitor treatment for     MRSA infections.  CULTURE, EXPECTORATED SPUTUM-ASSESSMENT     Status: None   Collection Time    02/16/13  3:51 PM      Result Value Range Status   Specimen Description SPUTUM   Final   Special Requests NONE   Final   Sputum evaluation     Final   Value: THIS SPECIMEN IS ACCEPTABLE. RESPIRATORY CULTURE REPORT TO FOLLOW.   Report Status 02/16/2013 FINAL   Final  CULTURE, RESPIRATORY (NON-EXPECTORATED)     Status: None   Collection Time    02/16/13  3:51 PM      Result Value Range  Status   Specimen Description SPUTUM   Final   Special Requests NONE   Final   Gram Stain     Final   Value: RARE WBC PRESENT, PREDOMINANTLY PMN     RARE SQUAMOUS EPITHELIAL CELLS PRESENT     RARE GRAM POSITIVE COCCI IN PAIRS     Performed at [facility]  Final   Value: NORMAL OROPHARYNGEAL FLORA     Performed at [facility]   Report Status 02/19/2013 FINAL   Final  URINE CULTURE     Status: None   Collection Time    02/16/13  5:00 PM      Result Value Range Status   Specimen Description URINE, RANDOM   Final   Special Requests NONE   Final   Culture  Setup Time     Final   Value: 02/16/2013 21:07     Performed at [place]     Final   Value: NO GROWTH     Performed at [facility]   Culture     Final   Value: NO GROWTH     Performed at [facility]   Report Status 02/17/2013 FINAL   Final     Studies: No results found.  Scheduled Meds: . albuterol  2.5 mg Nebulization BID  . amLODipine  10 mg Oral Daily  . aspirin EC  81 mg Oral Daily  . atorvastatin  20 mg Oral q1800  . azithromycin  500 mg Oral Daily  . cefTRIAXone (ROCEPHIN)  IV  1 g Intravenous Q24H  . cholecalciferol  2,000 Units Oral Daily  . etodolac  200 mg Oral Q6H  . guaiFENesin  1,200 mg Oral BID  . indapamide  1.25 mg Oral q morning - 10a  . ipratropium  0.5 mg Nebulization BID  . linagliptin  5 mg Oral Daily  . losartan  100 mg Oral Daily  . metFORMIN  850 mg Oral BID WC  . methylPREDNISolone (SOLU-MEDROL) injection  60 mg Intravenous Q12H  . metoCLOPramide  10 mg Oral TID AC  . multivitamin with minerals  1 tablet Oral Daily  . pantoprazole  40 mg Oral Daily   Continuous Infusions: . sodium chloride 1,000 mL (02/19/13 0547)

## 2013-02-19 NOTE — Progress Notes (Signed)
Inpatient Diabetes Program Recommendations  AACE/ADA: New Consensus Statement on Inpatient Glycemic Control (2013)  Target Ranges:  Prepandial:   less than 140 mg/dL      Peak postprandial:   less than 180 mg/dL (1-2 hours)      Critically ill patients:  140 - 180 mg/dL   Reason for Visit: Hyperglycemia Results for [name], [name] (MRN [id number]) as of 02/19/2013 13:59  Ref. Range 02/19/2013 07:51  Glucose-Capillary Latest Range: 70-99 mg/dL 252 (H)  Results for [name], [name] (MRN [id number]) as of 02/19/2013 13:59  Ref. Range 02/16/2013 07:30  Hemoglobin A1C Latest Range: <5.7 % 7.4 (H)     Inpatient Diabetes Program Recommendations Correction (SSI): Add Novolog moderate tidwc and hs HgbA1C: 7.4% Diet: Change diet to CHO mod med  Note: Will follow while inpatient.  Thank you. Lorenda Peck, RD, LDN, CDE Inpatient Diabetes Coordinator [phone]

## 2013-02-19 NOTE — Progress Notes (Signed)
Stopped to see patient today. She and her husband were appreciative of my visit. Prayed, asking for presence, healing, and continued improvement in her condition. Offered support and encouragement.

## 2013-02-20 LAB — GLUCOSE, CAPILLARY: Glucose-Capillary: 327 mg/dL — ABNORMAL HIGH (ref 70–99)

## 2013-02-20 MED ORDER — PREDNISONE (PAK) 10 MG PO TABS: ORAL_TABLET | ORAL | Status: DC

## 2013-02-20 MED ORDER — LEVOFLOXACIN 500 MG PO TABS: 500.0000 mg | ORAL_TABLET | Freq: Every day | ORAL | Status: DC

## 2013-02-20 MED ORDER — GUAIFENESIN ER 600 MG PO TB12: 1200.0000 mg | ORAL_TABLET | Freq: Two times a day (BID) | ORAL | Status: DC

## 2013-02-20 MED ORDER — ALBUTEROL SULFATE HFA 108 (90 BASE) MCG/ACT IN AERS: 2.0000 | INHALATION_SPRAY | Freq: Four times a day (QID) | RESPIRATORY_TRACT | Status: DC | PRN

## 2013-02-20 NOTE — Care Management Note (Signed)
Cm spoke with patient at the bedside concerning discharge planning. No PCP on record. Pt states PCP Dr. Darlyne Russian. Pt utilizes [facility] on [address].  Patient lives home with spouse.No other barriers identified.   Mechele Claude, RN, MSN [phone]

## 2013-02-20 NOTE — Discharge Summary (Signed)
Physician Discharge Summary  [name] [id number] DOB: 13-Aug-1950 DOA: 02/16/2013  PCP: [name], MD  Admit date: 02/16/2013 Discharge date: 02/20/2013  Recommendations for Outpatient Follow-up:  1. Recommend followup with PCP 1-2 weeks post discharge. Consider outpatient pulmonary function testing. 2. Recommend reassessment of glycemic control by PCP given hemoglobin A1c 7. 4%.  Discharge Diagnoses:  Principal Problem:    Acute respiratory failure with hypoxia secondary to community-acquired pneumonia and bronchospasm Active Problems:    DM w/o Complication Type II    ANEMIA-IRON DEFICIENCY    Unspecified essential hypertension    HYPERLIPIDEMIA    CAP (community acquired pneumonia)    Ear bleeding   Discharge Condition: Improved.  Diet recommendation: Low-sodium, heart healthy, carbohydrate modified.  History of present illness:  The patient is a 62 year old female with past medical history of hypertension, dyslipidemia and diabetes who was admitted on 02/16/2013 with cough, fever and chills. She's been treated for community-acquired pneumonia and bronchospasm during the course of her hospital stay.  Hospital Course by problem:  Principal Problem:  Acute respiratory failure with hypoxia  -Secondary to community acquired pneumonia with bronchospasm. Discharge home on an additional 7 days of Levaquin therapy and albuterol inhaler to use his needed. -Solu-Medrol added 02/19/2013. Will send home on a six-day prednisone taper. -Blood cultures  ordered at the time of the admission as a part of pneumonia order set but were not drawn for some reason.  -Resp culture only grew normal oropharyngeal flora . Legionella and strep pneumoniae negative. Active Problems:  DM w/o Complication Type II  -Hemoglobin A1c 7.4 on this admission indicating suboptimal glycemic control. Recommend close followup with PCP for reassessment of glycemic control. -Continue metformin and  Januvia at discharge.   ANEMIA-IRON DEFICIENCY  -Mild, with no indications for transfusion while in the hospital.  Unspecified essential hypertension  -Controlled on Cozaar and Norvasc.  HYPERLIPIDEMIA  -Continue  statin therapy.  Ear pain/bleeding  -No specific etiology found. Recommend outpatient referral to ENT if symptoms persistent.    Procedures:  None.   Consultations:  None.   Discharge Exam: Filed Vitals:   02/20/13 0515  BP: 166/82  Pulse: 84  Temp: 97.8 F (36.6 C)  Resp: 15   Filed Vitals:   02/19/13 1932 02/19/13 2205 02/20/13 0515 02/20/13 0755  BP:  155/66 166/82   Pulse:  102 84   Temp:  98 F (36.7 C) 97.8 F (36.6 C)   TempSrc:  Oral Oral   Resp:  16 15   Height:      Weight:      SpO2: 96% 97% 96% 99%    Gen:  NAD Cardiovascular:  RRR, No M/R/G Respiratory: Lungs CTAB, no active wheezing heard.  Gastrointestinal: Abdomen soft, NT/ND with normal active bowel sounds. Extremities: No C/E/C   Discharge Instructions  Discharge Orders   Future Orders Complete By Expires   Call MD for:  extreme fatigue  As directed    Call MD for:  temperature >100.4  As directed    Call MD for:  As directed    Scheduling Instructions:     Worsening shortness of breath.   Diet - low sodium heart healthy  As directed    Diet Carb Modified  As directed    Discharge instructions  As directed    Comments:     You were cared for by Dr. Jacquelynn Cree  (a hospitalist) during your hospital stay. If you have any questions about your discharge medications or  the care you received while you were in the hospital after you are discharged, you can call the unit and ask to speak with the hospitalist on call if the hospitalist that took care of you is not available. Once you are discharged, your primary care physician will handle any further medical issues. Please note that NO REFILLS for any discharge medications will be authorized once you are discharged, as it is  imperative that you return to your primary care physician (or establish a relationship with a primary care physician if you do not have one) for your aftercare needs so that they can reassess your need for medications and monitor your lab values.  Any outstanding tests can be reviewed by your PCP at your follow up visit.  It is also important to review any medicine changes with your PCP.  Please bring these d/c instructions with you to your next visit so your physician can review these changes with you.  If you do not have a primary care physician, you can call [phone] for a physician referral.  It is highly recommended that you obtain a PCP for hospital follow up.   Increase activity slowly  As directed        Medication List    STOP taking these medications       ALKA-SELTZER PLS ALLERGY & CGH 5-6.25-10-325 MG Caps  Generic drug:  Phenyleph-Doxylamine-DM-APAP     THERAFLU SEVERE COLD & COUGH PACKET Misc  Generic drug:  Phenyleph-Diphenhyd-DM-APAP     VICKS DAYQUIL/NYQUIL CLD & FLU PO      TAKE these medications       albuterol 108 (90 BASE) MCG/ACT inhaler  Commonly known as:  PROVENTIL HFA;VENTOLIN HFA  Inhale 2 puffs into the lungs every 6 (six) hours as needed for wheezing.     amLODipine 10 MG tablet  Commonly known as:  NORVASC  Take 10 mg by mouth daily.     aspirin EC 81 MG tablet  Take 81 mg by mouth daily.     atorvastatin 20 MG tablet  Commonly known as:  LIPITOR  Take 20 mg by mouth daily.     etodolac 400 MG 24 hr tablet  Commonly known as:  LODINE XL  Take 400 mg by mouth 2 (two) times daily.     guaiFENesin 600 MG 12 hr tablet  Commonly known as:  MUCINEX  Take 2 tablets (1,200 mg total) by mouth 2 (two) times daily.     indapamide 1.25 MG tablet  Commonly known as:  LOZOL  Take 1.25 mg by mouth every morning.     levofloxacin 500 MG tablet  Commonly known as:  LEVAQUIN  Take 1 tablet (500 mg total) by mouth daily.     losartan 100 MG tablet   Commonly known as:  COZAAR  Take 100 mg by mouth daily.     metFORMIN 850 MG tablet  Commonly known as:  GLUCOPHAGE  Take 850 mg by mouth 2 (two) times daily with a meal.     metoCLOPramide 10 MG tablet  Commonly known as:  REGLAN  Take 10 mg by mouth 3 (three) times daily before meals.     multivitamin with minerals Tabs tablet  Take 1 tablet by mouth daily.     pantoprazole 40 MG tablet  Commonly known as:  PROTONIX  Take 1 tablet (40 mg total) by mouth daily.     predniSONE 10 MG tablet  Commonly known as:  STERAPRED UNI-PAK  Take  6 tablets on 02/21/2013, then decrease by one tablet daily until off.     sitaGLIPtin 100 MG tablet  Commonly known as:  JANUVIA  Take 100 mg by mouth daily.     traMADol 50 MG tablet  Commonly known as:  ULTRAM  Take 50 mg by mouth 3 (three) times daily as needed. For pain     Vitamin D 2000 UNITS tablet  Take 2,000 Units by mouth daily.          The results of significant diagnostics from this hospitalization (including imaging, microbiology, ancillary and laboratory) are listed below for reference.    Significant Diagnostic Studies: Dg Chest 2 View  02/16/2013   *RADIOLOGY REPORT*  Clinical Data: Cough, wheezing, fever.  CHEST - 2 VIEW  Comparison: 08/07/2011  Findings: Patchy ill-defined airspace opacities in the right upper lobe.  Left lung clear.  Coarse chronic   perihilar interstitial markings.  Heart size normal.  Previous CABG.  No effusion.  IMPRESSION:  1. Scattered patchy right upper lobe air space opacities suggesting possible early pneumonia.   Original Report Authenticated By: D. Wallace Going, MD    Labs:  Basic Metabolic Panel:  Recent Labs Lab 02/16/13 0730 02/16/13 1230 02/17/13 0358  NA 135 138 137  K 4.2 4.0 4.3  CL 100 100 104  CO2 26 27 26   GLUCOSE 157* 180* 165*  BUN 11 9 12   CREATININE 1.00 0.96 1.08  CALCIUM 9.5 9.7 8.8  MG  --  2.4  --   PHOS  --  3.0  --    GFR Estimated Creatinine Clearance:  68.7 ml/min (by C-G formula based on Cr of 1.08). Liver Function Tests:  Recent Labs Lab 02/16/13 1230 02/17/13 0358  AST 12 9  ALT 8 6  ALKPHOS 75 62  BILITOT 0.4 0.2*  PROT 7.4 6.1  ALBUMIN 3.2* 2.6*   Coagulation profile  Recent Labs Lab 02/16/13 1230  INR 0.90    CBC:  Recent Labs Lab 02/16/13 0730 02/16/13 1230 02/17/13 0358 02/18/13 0340  WBC 7.7 8.5 6.9 7.1  NEUTROABS 4.4 5.2  --   --   HGB 11.4* 11.5* 9.7* 9.8*  HCT 35.7* 35.9* 31.5* 30.5*  MCV 83.2 83.1 83.8 83.1  PLT 264 256 214 231   CBG:  Recent Labs Lab 02/17/13 0729 02/18/13 0737 02/19/13 0751 02/20/13 0752  GLUCAP 153* 104* 252* 327*   Microbiology Recent Results (from the past 240 hour(s))  MRSA PCR SCREENING     Status: None   Collection Time    02/16/13 12:24 PM      Result Value Range Status   MRSA by PCR NEGATIVE  NEGATIVE Final   Comment:            The GeneXpert MRSA Assay (FDA     approved for NASAL specimens     only), is one component of a     comprehensive MRSA colonization     surveillance program. It is not     intended to diagnose MRSA     infection nor to guide or     monitor treatment for     MRSA infections.  CULTURE, EXPECTORATED SPUTUM-ASSESSMENT     Status: None   Collection Time    02/16/13  3:51 PM      Result Value Range Status   Specimen Description SPUTUM   Final   Special Requests NONE   Final   Sputum evaluation     Final  Value: THIS SPECIMEN IS ACCEPTABLE. RESPIRATORY CULTURE REPORT TO FOLLOW.   Report Status 02/16/2013 FINAL   Final  CULTURE, RESPIRATORY (NON-EXPECTORATED)     Status: None   Collection Time    02/16/13  3:51 PM      Result Value Range Status   Specimen Description SPUTUM   Final   Special Requests NONE   Final   Gram Stain     Final   Value: RARE WBC PRESENT, PREDOMINANTLY PMN     RARE SQUAMOUS EPITHELIAL CELLS PRESENT     RARE GRAM POSITIVE COCCI IN PAIRS     Performed at [facility]   Culture     Final    Value: NORMAL OROPHARYNGEAL FLORA     Performed at [facility]   Report Status 02/19/2013 FINAL   Final  URINE CULTURE     Status: None   Collection Time    02/16/13  5:00 PM      Result Value Range Status   Specimen Description URINE, RANDOM   Final   Special Requests NONE   Final   Culture  Setup Time     Final   Value: 02/16/2013 21:07     Performed at [facility]     Final   Value: NO GROWTH     Performed at [facility]   Culture     Final   Value: NO GROWTH     Performed at [facility]   Report Status 02/17/2013 FINAL   Final    Time coordinating discharge: 35 minutes.   Signed:  Lynee Rosenbach  Pager [phone] [facility] 02/20/2013, 11:47 AM

## 2013-02-20 NOTE — Progress Notes (Signed)
Patient discharged home, all discharge medications and instructions reviewed and questions answered. Patient to be assisted to vehicle by wheelchair when ride arrives.

## 2013-03-05 ENCOUNTER — Ambulatory Visit: Payer: Medicare Other

## 2013-07-22 ENCOUNTER — Encounter (HOSPITAL_COMMUNITY): Payer: Self-pay | Admitting: Emergency Medicine

## 2013-07-22 ENCOUNTER — Emergency Department (HOSPITAL_COMMUNITY)
Admission: EM | Admit: 2013-07-22 | Discharge: 2013-07-22 | Disposition: A | Discharge status: Home / Self Care | Payer: Medicare Other | Attending: Emergency Medicine | Admitting: Emergency Medicine

## 2013-07-22 DIAGNOSIS — Z860100 Personal history of colon polyps, unspecified: Secondary | ICD-10-CM | POA: Insufficient documentation

## 2013-07-22 DIAGNOSIS — F3289 Other specified depressive episodes: Secondary | ICD-10-CM | POA: Insufficient documentation

## 2013-07-22 DIAGNOSIS — M129 Arthropathy, unspecified: Secondary | ICD-10-CM | POA: Insufficient documentation

## 2013-07-22 DIAGNOSIS — Z8673 Personal history of transient ischemic attack (TIA), and cerebral infarction without residual deficits: Secondary | ICD-10-CM | POA: Insufficient documentation

## 2013-07-22 DIAGNOSIS — E785 Hyperlipidemia, unspecified: Secondary | ICD-10-CM | POA: Insufficient documentation

## 2013-07-22 DIAGNOSIS — Z79899 Other long term (current) drug therapy: Secondary | ICD-10-CM | POA: Insufficient documentation

## 2013-07-22 DIAGNOSIS — E119 Type 2 diabetes mellitus without complications: Secondary | ICD-10-CM | POA: Insufficient documentation

## 2013-07-22 DIAGNOSIS — K029 Dental caries, unspecified: Secondary | ICD-10-CM | POA: Insufficient documentation

## 2013-07-22 DIAGNOSIS — I10 Essential (primary) hypertension: Secondary | ICD-10-CM | POA: Insufficient documentation

## 2013-07-22 DIAGNOSIS — Z792 Long term (current) use of antibiotics: Secondary | ICD-10-CM | POA: Insufficient documentation

## 2013-07-22 DIAGNOSIS — Z951 Presence of aortocoronary bypass graft: Secondary | ICD-10-CM | POA: Insufficient documentation

## 2013-07-22 DIAGNOSIS — R51 Headache: Secondary | ICD-10-CM | POA: Insufficient documentation

## 2013-07-22 DIAGNOSIS — H9209 Otalgia, unspecified ear: Secondary | ICD-10-CM | POA: Insufficient documentation

## 2013-07-22 DIAGNOSIS — I2510 Atherosclerotic heart disease of native coronary artery without angina pectoris: Secondary | ICD-10-CM | POA: Insufficient documentation

## 2013-07-22 DIAGNOSIS — Z87891 Personal history of nicotine dependence: Secondary | ICD-10-CM | POA: Insufficient documentation

## 2013-07-22 DIAGNOSIS — Z862 Personal history of diseases of the blood and blood-forming organs and certain disorders involving the immune mechanism: Secondary | ICD-10-CM | POA: Insufficient documentation

## 2013-07-22 DIAGNOSIS — Z7982 Long term (current) use of aspirin: Secondary | ICD-10-CM | POA: Insufficient documentation

## 2013-07-22 DIAGNOSIS — F329 Major depressive disorder, single episode, unspecified: Secondary | ICD-10-CM | POA: Insufficient documentation

## 2013-07-22 DIAGNOSIS — K219 Gastro-esophageal reflux disease without esophagitis: Secondary | ICD-10-CM | POA: Insufficient documentation

## 2013-07-22 DIAGNOSIS — K089 Disorder of teeth and supporting structures, unspecified: Secondary | ICD-10-CM | POA: Insufficient documentation

## 2013-07-22 DIAGNOSIS — K0889 Other specified disorders of teeth and supporting structures: Secondary | ICD-10-CM

## 2013-07-22 MED ORDER — HYDROCODONE-ACETAMINOPHEN 5-325 MG PO TABS: 1.0000 | ORAL_TABLET | ORAL | Status: DC | PRN

## 2013-07-22 MED ORDER — HYDROCODONE-ACETAMINOPHEN 5-325 MG PO TABS
1.0000 | ORAL_TABLET | Freq: Once | ORAL | Status: AC
  Administered 2013-07-22: 1 via ORAL
  Filled 2013-07-22: qty 1

## 2013-07-22 MED ORDER — AMOXICILLIN 500 MG PO CAPS
500.0000 mg | ORAL_CAPSULE | Freq: Once | ORAL | Status: AC
Start: 2013-07-22 — End: 2013-07-22
  Administered 2013-07-22: 500 mg via ORAL
  Filled 2013-07-22: qty 1

## 2013-07-22 MED ORDER — AMOXICILLIN 500 MG PO CAPS: 500.0000 mg | ORAL_CAPSULE | Freq: Three times a day (TID) | ORAL | Status: DC

## 2013-07-22 NOTE — ED Provider Notes (Signed)
Medical screening examination/treatment/procedure(s) were performed by non-physician practitioner and as supervising physician I was immediately available for consultation/collaboration.  EKG Interpretation   None        Merryl Hacker, MD 07/22/13 [phone]

## 2013-07-22 NOTE — Discharge Instructions (Signed)
Dental Care and Dentist Visits Dental care supports good overall health. Regular dental visits can also help you avoid dental pain, bleeding, infection, and other more serious health problems in the future. It is important to keep the mouth healthy because diseases in the teeth, gums, and other oral tissues can spread to other areas of the body. Some problems, such as diabetes, heart disease, and pre-term labor have been associated with poor oral health.  See your dentist every 6 months. If you experience emergency problems such as a toothache or broken tooth, go to the dentist right away. If you see your dentist regularly, you may catch problems early. It is easier to be treated for problems in the early stages.  WHAT TO EXPECT AT A DENTIST VISIT  Your dentist will look for many common oral health problems and recommend proper treatment. At your regular dental visit, you can expect:  Gentle cleaning of the teeth and gums. This includes scraping and polishing. This helps to remove the sticky substance around the teeth and gums (plaque). Plaque forms in the mouth shortly after eating. Over time, plaque hardens on the teeth as tartar. If tartar is not removed regularly, it can cause problems. Cleaning also helps remove stains.  Periodic X-rays. These pictures of the teeth and supporting bone will help your dentist assess the health of your teeth.  Periodic fluoride treatments. Fluoride is a natural mineral shown to help strengthen teeth. Fluoride treatmentinvolves applying a fluoride gel or varnish to the teeth. It is most commonly done in children.  Examination of the mouth, tongue, jaws, teeth, and gums to look for any oral health problems, such as:  Cavities (dental caries). This is decay on the tooth caused by plaque, sugar, and acid in the mouth. It is best to catch a cavity when it is small.  Inflammation of the gums caused by plaque buildup (gingivitis).  Problems with the mouth or malformed  or misaligned teeth.  Oral cancer or other diseases of the soft tissues or jaws. KEEP YOUR TEETH AND GUMS HEALTHY For healthy teeth and gums, follow these general guidelines as well as your dentist's specific advice:  Have your teeth professionally cleaned at the dentist every 6 months.  Brush twice daily with a fluoride toothpaste.  Floss your teeth daily.  Ask your dentist if you need fluoride supplements, treatments, or fluoride toothpaste.  Eat a healthy diet. Reduce foods and drinks with added sugar.  Avoid smoking. TREATMENT FOR ORAL HEALTH PROBLEMS If you have oral health problems, treatment varies depending on the conditions present in your teeth and gums.  Your caregiver will most likely recommend good oral hygiene at each visit.  For cavities, gingivitis, or other oral health disease, your caregiver will perform a procedure to treat the problem. This is typically done at a separate appointment. Sometimes your caregiver will refer you to another dental specialist for specific tooth problems or for surgery. SEEK IMMEDIATE DENTAL CARE IF:  You have pain, bleeding, or soreness in the gum, tooth, jaw, or mouth area.  A permanent tooth becomes loose or separated from the gum socket.  You experience a blow or injury to the mouth or jaw area. Document Released: 02/23/2011 Document Revised: 09/05/2011 Document Reviewed: 02/23/2011 [facility] Patient Information 2014 [place], [place].  Dental Pain A tooth ache may be caused by cavities (tooth decay). Cavities expose the nerve of the tooth to air and hot or cold temperatures. It may come from an infection or abscess (also called a  boil or furuncle) around your tooth. It is also often caused by dental caries (tooth decay). This causes the pain you are having. DIAGNOSIS  Your caregiver can diagnose this problem by exam. TREATMENT   If caused by an infection, it may be treated with medications which kill germs (antibiotics) and pain  medications as prescribed by your caregiver. Take medications as directed.  Only take over-the-counter or prescription medicines for pain, discomfort, or fever as directed by your caregiver.  Whether the tooth ache today is caused by infection or dental disease, you should see your dentist as soon as possible for further care. SEEK MEDICAL CARE IF: The exam and treatment you received today has been provided on an emergency basis only. This is not a substitute for complete medical or dental care. If your problem worsens or new problems (symptoms) appear, and you are unable to meet with your dentist, call or return to this location. SEEK IMMEDIATE MEDICAL CARE IF:   You have a fever.  You develop redness and swelling of your face, jaw, or neck.  You are unable to open your mouth.  You have severe pain uncontrolled by pain medicine. MAKE SURE YOU:   Understand these instructions.  Will watch your condition.  Will get help right away if you are not doing well or get worse. Document Released: 06/13/2005 Document Revised: 09/05/2011 Document Reviewed: 01/30/2008 [facility] Patient Information 2014 [place].

## 2013-07-22 NOTE — ED Notes (Signed)
Pt c/o top right jaw toothache x 3 wks
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
## 2013-07-22 NOTE — ED Notes (Signed)
Pt has been out of her BP med due to financial trouble

## 2013-12-24 ENCOUNTER — Encounter (HOSPITAL_COMMUNITY): Payer: Self-pay | Admitting: Emergency Medicine

## 2013-12-24 ENCOUNTER — Emergency Department (HOSPITAL_COMMUNITY): Payer: Medicare HMO

## 2013-12-24 ENCOUNTER — Inpatient Hospital Stay (HOSPITAL_COMMUNITY)
Admission: EM | Admit: 2013-12-24 | Discharge: 2013-12-27 | DRG: 189 | Disposition: A | Discharge status: Home / Self Care | Payer: Medicare HMO | Attending: Internal Medicine | Admitting: Internal Medicine

## 2013-12-24 DIAGNOSIS — I3139 Other pericardial effusion (noninflammatory): Secondary | ICD-10-CM

## 2013-12-24 DIAGNOSIS — Z8673 Personal history of transient ischemic attack (TIA), and cerebral infarction without residual deficits: Secondary | ICD-10-CM | POA: Diagnosis not present

## 2013-12-24 DIAGNOSIS — R0602 Shortness of breath: Secondary | ICD-10-CM | POA: Diagnosis not present

## 2013-12-24 DIAGNOSIS — D509 Iron deficiency anemia, unspecified: Secondary | ICD-10-CM | POA: Diagnosis present

## 2013-12-24 DIAGNOSIS — Z9889 Other specified postprocedural states: Secondary | ICD-10-CM

## 2013-12-24 DIAGNOSIS — D631 Anemia in chronic kidney disease: Secondary | ICD-10-CM | POA: Diagnosis present

## 2013-12-24 DIAGNOSIS — Z800 Family history of malignant neoplasm of digestive organs: Secondary | ICD-10-CM | POA: Diagnosis not present

## 2013-12-24 DIAGNOSIS — Z951 Presence of aortocoronary bypass graft: Secondary | ICD-10-CM | POA: Diagnosis not present

## 2013-12-24 DIAGNOSIS — J9600 Acute respiratory failure, unspecified whether with hypoxia or hypercapnia: Principal | ICD-10-CM | POA: Diagnosis present

## 2013-12-24 DIAGNOSIS — Z79899 Other long term (current) drug therapy: Secondary | ICD-10-CM

## 2013-12-24 DIAGNOSIS — I272 Pulmonary hypertension, unspecified: Secondary | ICD-10-CM

## 2013-12-24 DIAGNOSIS — J449 Chronic obstructive pulmonary disease, unspecified: Secondary | ICD-10-CM | POA: Diagnosis present

## 2013-12-24 DIAGNOSIS — Z7982 Long term (current) use of aspirin: Secondary | ICD-10-CM

## 2013-12-24 DIAGNOSIS — Z833 Family history of diabetes mellitus: Secondary | ICD-10-CM | POA: Diagnosis not present

## 2013-12-24 DIAGNOSIS — I313 Pericardial effusion (noninflammatory): Secondary | ICD-10-CM

## 2013-12-24 DIAGNOSIS — Z8614 Personal history of Methicillin resistant Staphylococcus aureus infection: Secondary | ICD-10-CM

## 2013-12-24 DIAGNOSIS — E785 Hyperlipidemia, unspecified: Secondary | ICD-10-CM | POA: Diagnosis present

## 2013-12-24 DIAGNOSIS — I10 Essential (primary) hypertension: Secondary | ICD-10-CM | POA: Diagnosis present

## 2013-12-24 DIAGNOSIS — J9601 Acute respiratory failure with hypoxia: Secondary | ICD-10-CM | POA: Diagnosis present

## 2013-12-24 DIAGNOSIS — E1165 Type 2 diabetes mellitus with hyperglycemia: Secondary | ICD-10-CM

## 2013-12-24 DIAGNOSIS — Z860100 Personal history of colon polyps, unspecified: Secondary | ICD-10-CM

## 2013-12-24 DIAGNOSIS — K219 Gastro-esophageal reflux disease without esophagitis: Secondary | ICD-10-CM | POA: Diagnosis present

## 2013-12-24 DIAGNOSIS — J441 Chronic obstructive pulmonary disease with (acute) exacerbation: Secondary | ICD-10-CM

## 2013-12-24 DIAGNOSIS — E119 Type 2 diabetes mellitus without complications: Secondary | ICD-10-CM

## 2013-12-24 DIAGNOSIS — I2510 Atherosclerotic heart disease of native coronary artery without angina pectoris: Secondary | ICD-10-CM | POA: Diagnosis present

## 2013-12-24 DIAGNOSIS — IMO0001 Reserved for inherently not codable concepts without codable children: Secondary | ICD-10-CM | POA: Diagnosis present

## 2013-12-24 DIAGNOSIS — Z87891 Personal history of nicotine dependence: Secondary | ICD-10-CM

## 2013-12-24 DIAGNOSIS — R079 Chest pain, unspecified: Secondary | ICD-10-CM

## 2013-12-24 DIAGNOSIS — N189 Chronic kidney disease, unspecified: Secondary | ICD-10-CM | POA: Diagnosis present

## 2013-12-24 DIAGNOSIS — E7800 Pure hypercholesterolemia, unspecified: Secondary | ICD-10-CM | POA: Diagnosis present

## 2013-12-24 DIAGNOSIS — I200 Unstable angina: Secondary | ICD-10-CM

## 2013-12-24 DIAGNOSIS — G4730 Sleep apnea, unspecified: Secondary | ICD-10-CM | POA: Diagnosis present

## 2013-12-24 DIAGNOSIS — Z954 Presence of other heart-valve replacement: Secondary | ICD-10-CM | POA: Diagnosis not present

## 2013-12-24 DIAGNOSIS — Z823 Family history of stroke: Secondary | ICD-10-CM | POA: Diagnosis not present

## 2013-12-24 DIAGNOSIS — R0600 Dyspnea, unspecified: Secondary | ICD-10-CM | POA: Diagnosis present

## 2013-12-24 DIAGNOSIS — I319 Disease of pericardium, unspecified: Secondary | ICD-10-CM | POA: Diagnosis present

## 2013-12-24 DIAGNOSIS — J438 Other emphysema: Secondary | ICD-10-CM

## 2013-12-24 HISTORY — DX: Methicillin resistant Staphylococcus aureus infection, unspecified site: A49.02

## 2013-12-24 HISTORY — DX: Hypoxemia: R09.02

## 2013-12-24 LAB — HEPATIC FUNCTION PANEL
ALK PHOS: 63 U/L (ref 39–117)
ALT: 9 U/L (ref 0–35)
AST: 13 U/L (ref 0–37)
Albumin: 3.2 g/dL — ABNORMAL LOW (ref 3.5–5.2)
BILIRUBIN DIRECT: 0.2 mg/dL (ref 0.0–0.3)
Indirect Bilirubin: 0.1 mg/dL — ABNORMAL LOW (ref 0.3–0.9)
TOTAL PROTEIN: 6.5 g/dL (ref 6.0–8.3)
Total Bilirubin: 0.3 mg/dL (ref 0.3–1.2)

## 2013-12-24 LAB — CBC
HEMATOCRIT: 36.6 % (ref 36.0–46.0)
Hemoglobin: 11.9 g/dL — ABNORMAL LOW (ref 12.0–15.0)
MCH: 25.9 pg — AB (ref 26.0–34.0)
MCHC: 32.5 g/dL (ref 30.0–36.0)
MCV: 79.7 fL (ref 78.0–100.0)
Platelets: 232 10*3/uL (ref 150–400)
RBC: 4.59 MIL/uL (ref 3.87–5.11)
RDW: 14.4 % (ref 11.5–15.5)
WBC: 8.1 10*3/uL (ref 4.0–10.5)

## 2013-12-24 LAB — BASIC METABOLIC PANEL
BUN: 16 mg/dL (ref 6–23)
CHLORIDE: 100 meq/L (ref 96–112)
CO2: 25 meq/L (ref 19–32)
CREATININE: 1.01 mg/dL (ref 0.50–1.10)
Calcium: 9.3 mg/dL (ref 8.4–10.5)
GFR calc Af Amer: 67 mL/min — ABNORMAL LOW (ref >90)
GFR calc non Af Amer: 58 mL/min — ABNORMAL LOW (ref >90)
GLUCOSE: 234 mg/dL — AB (ref 70–99)
Potassium: 4.1 mEq/L (ref 3.7–5.3)
Sodium: 138 mEq/L (ref 137–147)

## 2013-12-24 LAB — GLUCOSE, CAPILLARY: Glucose-Capillary: 509 mg/dL — ABNORMAL HIGH (ref 70–99)

## 2013-12-24 LAB — PRO B NATRIURETIC PEPTIDE: Pro B Natriuretic peptide (BNP): 460.7 pg/mL — ABNORMAL HIGH (ref 0–125)

## 2013-12-24 LAB — TROPONIN I: Troponin I: <0.3 ng/mL (ref <0.30)

## 2013-12-24 LAB — I-STAT TROPONIN, ED: Troponin i, poc: 0.02 ng/mL (ref 0.00–0.08)

## 2013-12-24 MED ORDER — METHYLPREDNISOLONE SODIUM SUCC 125 MG IJ SOLR
125.0000 mg | Freq: Once | INTRAMUSCULAR | Status: AC
  Administered 2013-12-24: 125 mg via INTRAVENOUS
  Filled 2013-12-24: qty 2

## 2013-12-24 MED ORDER — IPRATROPIUM-ALBUTEROL 0.5-2.5 (3) MG/3ML IN SOLN
3.0000 mL | Freq: Once | RESPIRATORY_TRACT | Status: AC
  Administered 2013-12-24: 3 mL via RESPIRATORY_TRACT
  Filled 2013-12-24: qty 3

## 2013-12-24 MED ORDER — ONDANSETRON HCL 4 MG/2ML IJ SOLN
4.0000 mg | Freq: Four times a day (QID) | INTRAMUSCULAR | Status: DC | PRN
  Administered 2013-12-26: 4 mg via INTRAVENOUS
  Filled 2013-12-24: qty 2

## 2013-12-24 MED ORDER — PANTOPRAZOLE SODIUM 40 MG PO TBEC
40.0000 mg | DELAYED_RELEASE_TABLET | Freq: Every day | ORAL | Status: DC
  Administered 2013-12-24: 40 mg via ORAL
  Filled 2013-12-24: qty 1

## 2013-12-24 MED ORDER — INDAPAMIDE 1.25 MG PO TABS
1.2500 mg | ORAL_TABLET | Freq: Every morning | ORAL | Status: DC
  Administered 2013-12-25 – 2013-12-27 (×3): 1.25 mg via ORAL
  Filled 2013-12-24 (×4): qty 1

## 2013-12-24 MED ORDER — SODIUM CHLORIDE 0.9 % IV SOLN
Freq: Once | INTRAVENOUS | Status: AC
  Administered 2013-12-24: 15:00:00 via INTRAVENOUS

## 2013-12-24 MED ORDER — AMLODIPINE BESYLATE 10 MG PO TABS
10.0000 mg | ORAL_TABLET | Freq: Every day | ORAL | Status: DC
  Administered 2013-12-24 – 2013-12-27 (×4): 10 mg via ORAL
  Filled 2013-12-24 (×4): qty 1

## 2013-12-24 MED ORDER — ASPIRIN 81 MG PO CHEW
CHEWABLE_TABLET | ORAL | Status: AC
  Filled 2013-12-24: qty 1

## 2013-12-24 MED ORDER — ENOXAPARIN SODIUM 60 MG/0.6ML ~~LOC~~ SOLN
0.5000 mg/kg | SUBCUTANEOUS | Status: DC
  Administered 2013-12-25 – 2013-12-26 (×3): 55 mg via SUBCUTANEOUS
  Filled 2013-12-24 (×5): qty 0.6

## 2013-12-24 MED ORDER — SODIUM CHLORIDE 0.9 % IJ SOLN
3.0000 mL | Freq: Two times a day (BID) | INTRAMUSCULAR | Status: DC
  Administered 2013-12-24 – 2013-12-27 (×4): 3 mL via INTRAVENOUS

## 2013-12-24 MED ORDER — HYDROMORPHONE HCL PF 1 MG/ML IJ SOLN: 1.0000 mg | INTRAMUSCULAR | Status: DC | PRN

## 2013-12-24 MED ORDER — METOCLOPRAMIDE HCL 10 MG PO TABS: 10.0000 mg | ORAL_TABLET | Freq: Three times a day (TID) | ORAL | Status: DC

## 2013-12-24 MED ORDER — ASPIRIN EC 81 MG PO TBEC
81.0000 mg | DELAYED_RELEASE_TABLET | Freq: Every day | ORAL | Status: DC
  Administered 2013-12-24 – 2013-12-27 (×4): 81 mg via ORAL
  Filled 2013-12-24 (×4): qty 1

## 2013-12-24 MED ORDER — ADULT MULTIVITAMIN W/MINERALS CH
1.0000 | ORAL_TABLET | Freq: Every day | ORAL | Status: DC
  Administered 2013-12-24: 1 via ORAL
  Filled 2013-12-24: qty 1

## 2013-12-24 MED ORDER — IOHEXOL 350 MG/ML SOLN
100.0000 mL | Freq: Once | INTRAVENOUS | Status: AC | PRN
  Administered 2013-12-24: 100 mL via INTRAVENOUS

## 2013-12-24 MED ORDER — IPRATROPIUM-ALBUTEROL 0.5-2.5 (3) MG/3ML IN SOLN
3.0000 mL | RESPIRATORY_TRACT | Status: DC | PRN
  Administered 2013-12-26: 3 mL via RESPIRATORY_TRACT

## 2013-12-24 MED ORDER — ATORVASTATIN CALCIUM 20 MG PO TABS
20.0000 mg | ORAL_TABLET | Freq: Every day | ORAL | Status: DC
  Administered 2013-12-24 – 2013-12-27 (×4): 20 mg via ORAL
  Filled 2013-12-24: qty 2
  Filled 2013-12-24 (×3): qty 1

## 2013-12-24 MED ORDER — IBUPROFEN 200 MG PO TABS
200.0000 mg | ORAL_TABLET | Freq: Four times a day (QID) | ORAL | Status: DC | PRN
  Filled 2013-12-24: qty 1

## 2013-12-24 MED ORDER — ASPIRIN 325 MG PO TABS
325.0000 mg | ORAL_TABLET | Freq: Once | ORAL | Status: AC
  Administered 2013-12-24: 325 mg via ORAL
  Filled 2013-12-24: qty 1

## 2013-12-24 MED ORDER — MORPHINE SULFATE 4 MG/ML IJ SOLN
4.0000 mg | Freq: Once | INTRAMUSCULAR | Status: AC
  Administered 2013-12-24: 4 mg via INTRAVENOUS
  Filled 2013-12-24: qty 1

## 2013-12-24 MED ORDER — PANTOPRAZOLE SODIUM 40 MG PO TBEC: 40.0000 mg | DELAYED_RELEASE_TABLET | Freq: Every day | ORAL | Status: DC

## 2013-12-24 MED ORDER — NITROGLYCERIN IN D5W 200-5 MCG/ML-% IV SOLN
10.0000 ug/min | INTRAVENOUS | Status: DC
  Administered 2013-12-24: 10 ug/min via INTRAVENOUS
  Filled 2013-12-24: qty 250

## 2013-12-24 MED ORDER — NITROGLYCERIN 0.4 MG SL SUBL
0.4000 mg | SUBLINGUAL_TABLET | SUBLINGUAL | Status: AC | PRN
  Administered 2013-12-24 (×3): 0.4 mg via SUBLINGUAL
  Filled 2013-12-24: qty 1

## 2013-12-24 MED ORDER — TRAMADOL HCL 50 MG PO TABS: 50.0000 mg | ORAL_TABLET | Freq: Three times a day (TID) | ORAL | Status: DC | PRN

## 2013-12-24 MED ORDER — ENOXAPARIN SODIUM 40 MG/0.4ML ~~LOC~~ SOLN: 40.0000 mg | SUBCUTANEOUS | Status: DC

## 2013-12-24 MED ORDER — IPRATROPIUM-ALBUTEROL 0.5-2.5 (3) MG/3ML IN SOLN
3.0000 mL | RESPIRATORY_TRACT | Status: DC
  Administered 2013-12-24 (×2): 3 mL via RESPIRATORY_TRACT
  Filled 2013-12-24 (×2): qty 3

## 2013-12-24 MED ORDER — METFORMIN HCL 850 MG PO TABS
850.0000 mg | ORAL_TABLET | Freq: Every day | ORAL | Status: DC
  Administered 2013-12-25 – 2013-12-27 (×3): 850 mg via ORAL
  Filled 2013-12-24 (×5): qty 1

## 2013-12-24 MED ORDER — IPRATROPIUM-ALBUTEROL 0.5-2.5 (3) MG/3ML IN SOLN
3.0000 mL | Freq: Three times a day (TID) | RESPIRATORY_TRACT | Status: DC
  Administered 2013-12-25 – 2013-12-26 (×4): 3 mL via RESPIRATORY_TRACT
  Filled 2013-12-24 (×6): qty 3

## 2013-12-24 MED ORDER — LOSARTAN POTASSIUM 50 MG PO TABS
100.0000 mg | ORAL_TABLET | Freq: Every day | ORAL | Status: DC
  Administered 2013-12-24 – 2013-12-27 (×4): 100 mg via ORAL
  Filled 2013-12-24 (×5): qty 2

## 2013-12-24 MED ORDER — ONDANSETRON HCL 4 MG PO TABS: 4.0000 mg | ORAL_TABLET | Freq: Four times a day (QID) | ORAL | Status: DC | PRN

## 2013-12-24 NOTE — ED Notes (Signed)
Pt held in ER at this time, elevators are not working and pt is therefore unable to be transported to floor.

## 2013-12-24 NOTE — Progress Notes (Signed)
ANTICOAGULATION CONSULT NOTE - Initial Consult  Pharmacy Consult for Lovenox Indication: VTE prophylaxis  No Known Allergies  Patient Measurements:     Vital Signs: Temp: 98.9 F (37.2 C) (06/30 1251) Temp src: Oral (06/30 1251) BP: 160/65 mmHg (06/30 1713) Pulse Rate: 88 (06/30 1713)  Labs:  Recent Labs  12/24/13 1148 12/24/13 1601  HGB 11.9*  --   HCT 36.6  --   PLT 232  --   CREATININE 1.01  --   TROPONINI  --  <0.30    The CrCl is unknown because both a height and weight (above a minimum accepted value) are required for this calculation.   Medical History: Past Medical History  Diagnosis Date  . Coronary artery disease     a. s/p CABG 06/2010: S-D2; S-PDA (at time of MV surgery).  . 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 mitral valve - s/p resection and MV repair 06/2010 Dr. [name].  . Hypertension   . Stroke     a. 11/2009: mitral mass diagnosed at this time, also has distal R MCA stenosis, tx with coumadin. b. Readmitted 05/2010 with TIA symptoms - had not been taking Coumadin. s/p MV surgery 06/2010. Coumadin stopped 2013 after review of chart by Dr. [name] since mass was removed (stroke felt possibly related to this).   [name] Hx of transient ischemic attack (TIA)     a. See stroke section.  . Anemia, iron deficiency   . Diabetes mellitus     type II  . Stenosis of middle cerebral artery     a. Distal R MCA.  [name] Abscess in epidural space of L2-L5 lumbar spine 03/2006  . Arthritis   . Colon polyp     a. Multiple colonic polyps status post colonoscopy in October 2007, consistent with tubular adenoma, tubulovillous adenoma with no high-grade dysplasia or malignancy identified.   . Hepatitis     ?  [name] Sleep apnea   . Cataract   . Depression   . GERD (gastroesophageal reflux disease)   . Hyperlipidemia   . MRSA infection     a. History of recurrent skin infection and soft tissue abscesses, with MRSA positive in the past.  . Hypoxia     a. Has history of  acute hypoxic respiratory failure in the setting of bronchitis/PNA or prior admissions.      Assessment: 58 yoF admitted with acute respiratory failure with hypoxia/COPD exacerbation.  Pharmacy consulted to dose Lovenox for VTE prophylaxis.  Renal function WNL, Hgb 11.9, platelets WNL.  Weight 111 kg.  BMI 38.  Goal of Therapy:  VTE prophylaxis Monitor platelets by anticoagulation protocol: Yes   Plan:  Lovenox 55 mg (1 mg/kg for BMI>30) SQ q24h.  Hershal Coria 12/24/2013,7:14 PM

## 2013-12-24 NOTE — ED Notes (Signed)
Per pt, states central chest pain on and off for over a week-states discomfort with deep inhalation-history of stroke, and heart surgery and cath placement

## 2013-12-24 NOTE — ED Notes (Signed)
Pt to CT

## 2013-12-24 NOTE — ED Notes (Signed)
Initial Contact - pt to [place] with family, changed to hospital gown, placed to cardiac/02 monitor.  Pt reports constant, but intermittently worse, L sided CP and SOB x1week.  Pt reports +DOE, +orthopnea.  Appears winded while at rest.  Speaking full/clear sentences.  Pt denies fevers/chills.  Pt reports CP L sided, nonradiating, reproducible with deep breath.  Skin PWD.  A+Ox4.  Dr. Darl Householder at bedside on arrival.  NAD.

## 2013-12-24 NOTE — Consult Note (Signed)
Cardiology Consultation Note  Patient ID: [name], MRN: [id number], DOB/AGE: 08/05/1950 63 y.o. Admit date: 12/24/2013   Date of Consult: 12/24/2013 Primary Physician: Stacy Peers, MD Primary Cardiologist: [name]  Chief Complaint: CP Reason for Consult: CP  HPI: Ms. [name] is a 63 y/o F with history of CVA 11/2009 in setting of distal R MCA stenosis treated with Coumadin and also dx'd with mitral valve papillary fibroelastoma, TIA 05/2010 with subsequent resection/MV repair & 2V CABG 06/2010, hypoxia in setting of PNA/bronchitis in the past, 30+ yrs former tobacco (now with secondhand), DM, and HTN. Per notes from 2013 from our Coumadin clinic, after Dr. Stanford Greene had reviewed chart, her Coumadin was stopped since mass was removed and she was continued on ASA only for her cerebral artery stenosis. She has not been seen by [place] in 2 years. She states she had insurance issues so no longer has a PCP and as a result has been out of her medicines for over a month, except aspirin, metformin, and ibuprofen for her back PRN.  For the past week she's had left-sided sharp chest pain particularly related to inspiration. It is described as a pulling sensation that feels like it's pulling up towards her neck. She's also had more SOB this past week. No nausea, vomiting, palpitations, weight changes, syncope, LEE, chills, cough, fever. Chest pain is not exertional. She's had intermittent headaches for the last month. No visual changes or focal neuro changes reported. She came to the ER where she received 3 SL NTG without relief, then 4mg  morphine with improvement in symptoms. She also was given a Duoneb and NTG gtt. BP elevated in the 160s range. CXR: mild bibasilar atx. CT Angio: no PE; dilation of pulm artery up to 3.9cm which can be seen with pulm HTN, small L pleural effusion, small pericardial effusion. Labs significant for glu 234, Hgb 11.9, troponin neg x1. EKG with lateral TW changes that have been  seen on tracings in 2012. O2 sat in ED occasionally dipping into upper 80s, stable in the upper 90s post-neb on nasal cannula. ED has also ordered Solu-Medrol.   Past Medical History  Diagnosis Date  . Coronary artery disease     a. s/p CABG 06/2010: S-D2; S-PDA (at time of MV surgery).  . 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 mitral valve - s/p resection and MV repair 06/2010 Dr. Roxy Greene.  . Hypertension   . Stroke     a. 11/2009: mitral mass diagnosed at this time, also has distal R MCA stenosis, tx with coumadin. b. Readmitted 05/2010 with TIA symptoms - had not been taking Coumadin. s/p MV surgery 06/2010.    [name] Hx of transient ischemic attack (TIA)     a. See stroke section.  . Anemia, iron deficiency   . Diabetes mellitus     type II  . Stenosis of middle cerebral artery     a. Distal R MCA.  [name] Abscess in epidural space of L2-L5 lumbar spine 03/2006  . Arthritis   . Colon polyp     a. Multiple colonic polyps status post colonoscopy in October 2007, consistent with tubular adenoma, tubulovillous adenoma with no high-grade dysplasia or malignancy identified.   . Hepatitis     ?  [name] Sleep apnea   . Cataract   . Depression   . GERD (gastroesophageal reflux disease)   . Hyperlipidemia   . MRSA infection     a. History of recurrent skin infection and soft  tissue abscesses, with MRSA positive in the past.  . Hypoxia     a. Has history of acute hypoxic respiratory failure in the setting of bronchitis/PNA or prior admissions.      Most Recent Cardiac Studies: 06/2010: PROCEDURE: Median sternotomy for resection of mitral valve mass, mitral  valve repair using CorMatrix patch, CABG x2 (SVG to diagonal 1, SVG to  posterior descending coronary artery, coronary endarterectomy of the  posterior descending coronary artery), EVH from the right thigh by Dr.  Roxy Greene on July 21, 2010.  Cath 06/2010 PROCEDURE: Left and right heart catheterization.  INDICATIONS: Evaluate the patient with  mitral valve fibroelastoma.  PROCEDURE NOTE: Left heart catheterization was performed via the left  femoral artery, right heart catheterization was performed via the left  femoral vein. Both vessels were cannulated using anterior wall  puncture. A #5-French arterial sheath and a #7-French venous sheath  were inserted via the modified Seldinger technique. Preformed Judkins  and pigtail catheter were utilized. The patient tolerated the procedure  well and left the lab in stable condition.  RESULTS: Hemodynamics: RA mean 8, RV 45/6, PA 29/14 with a mean of 20,  pulmonary capillary pressure mean 10, aorta 143/72.  Coronaries: The left main was normal. The LAD had proximal luminal  irregularities. There were luminal irregularities throughout the mid  and distal vessel. First diagonal was moderate sized with proximal 25%  stenosis. Second diagonal was moderate sized with long 70% stenosis.  The circumflex had a long proximal 30-40% lesions. There was a mid  obtuse marginal which was large and branching with long proximal 40%  stenosis. Right coronary artery was a dominant vessel. It was occluded  in the distal segment before the PDA. The PDA was large and appeared to  be free of high-grade disease. There was reasonable collateral flow  from the LAD, circumflex to the right coronary artery.  Left ventricle: The left ventricle was not injected and the valve was  not crossed secondary to the fibroelastoma.  CONCLUSION: Known mitral valve fibroelastoma. Two-vessel coronary  artery disease including diagonal and right coronary artery.  PLAN: The patient will have mitral valve repair and CABG by Dr. Roxy Greene.  Stacy Breeding, MD, [facility]    2D Echo 10/2010 - Left ventricle: The cavity size was normal. Wall thickness was increased in a pattern of moderate LVH. Systolic function was normal. The estimated ejection fraction was in the range of 55% to 60%. Wall motion was normal; there were no regional wall  motion abnormalities. Features are consistent with a pseudonormal left ventricular filling pattern, with concomitant abnormal relaxation and increased filling pressure (grade 2 diastolic dysfunction). - Mitral valve: Prior procedures included surgical repair. There was systolic anterior motion. Mild regurgitation. - Pulmonary arteries: Systolic pressure was mildly increased. PA peak pressure: 16mm Hg (S). - Pericardium, extracardiac: A trivial pericardial effusion was identified.   Surgical History:  Past Surgical History  Procedure Laterality Date  . Vesicovaginal fistula closure w/ tah    . History of ankle fractures requiring surgery    . Mv repair and resection of mass  1/12  . Coronary artery bypass graft  1/12  . Total abdominal hysterectomy  1978  . Tonsillectomy  1971     Home Meds: Prior to Admission medications   Medication Sig Start Date End Date Taking? Authorizing Tresia Revolorio  albuterol (PROVENTIL HFA;VENTOLIN HFA) 108 (90 BASE) MCG/ACT inhaler Inhale 2 puffs into the lungs every 6 (six) hours as needed for wheezing or shortness  of breath.   Yes Historical Hendrix Yurkovich, MD  amLODipine (NORVASC) 10 MG tablet Take 10 mg by mouth daily.   Yes Historical Elaysha Bevard, MD  aspirin EC 81 MG tablet Take 81 mg by mouth daily.   Yes Historical Camara Renstrom, MD  atorvastatin (LIPITOR) 20 MG tablet Take 20 mg by mouth daily.   Yes Historical Loveda Colaizzi, MD  calcium-vitamin D 250-100 MG-UNIT per tablet Take 1 tablet by mouth daily.   Yes Historical Raechal Raben, MD  Cholecalciferol (VITAMIN D) 2000 UNITS tablet Take 2,000 Units by mouth daily.   Yes Historical Malijah Lietz, MD  etodolac (LODINE XL) 400 MG 24 hr tablet Take 400 mg by mouth 2 (two) times daily as needed (muscle pain.).    Yes Historical Ronzell Laban, MD  ibuprofen (ADVIL,MOTRIN) 200 MG tablet Take 200 mg by mouth every 6 (six) hours as needed for moderate pain.   Yes Historical Timofey Carandang, MD  indapamide (LOZOL) 1.25 MG tablet Take 1.25 mg by mouth every  morning.   Yes Historical Lynae Pederson, MD  losartan (COZAAR) 100 MG tablet Take 100 mg by mouth daily.   Yes Historical Ashland Wiseman, MD  metFORMIN (GLUCOPHAGE) 850 MG tablet Take 850 mg by mouth daily with breakfast.    Yes Historical Aliciana Ricciardi, MD  metoCLOPramide (REGLAN) 10 MG tablet Take 10 mg by mouth 3 (three) times daily before meals.   Yes Historical Dwain Huhn, MD  Multiple Vitamin (MULTIVITAMIN WITH MINERALS) TABS tablet Take 1 tablet by mouth daily.   Yes Historical Sala Tague, MD  pantoprazole (PROTONIX) 40 MG tablet Take 40 mg by mouth daily.   Yes Historical Kilynn Fitzsimmons, MD  traMADol (ULTRAM) 50 MG tablet Take 50 mg by mouth 3 (three) times daily as needed. For pain   Yes Historical Kevork Joyce, MD  pantoprazole (PROTONIX) 40 MG tablet Take 1 tablet (40 mg total) by mouth daily. 10/10/12 10/10/13  Jerene Bears, MD    Inpatient Medications:    . nitroGLYCERIN 10 mcg/min (12/24/13 1432)    Allergies: No Known Allergies  History   Social History  . Marital Status: Married    Spouse Name: N/A    Number of Children: 3  . Years of Education: N/A   Occupational History  .     Social History Main Topics  . Smoking status: Former Smoker    Quit date: 10/14/2010  . Smokeless tobacco: Never Used  . Alcohol Use: No  . Drug Use: No  . Sexual Activity: Not on file   Other Topics Concern  . Not on file   Social History Narrative   Lives with husband, stay at home, uses cane occasionally, still active/ambulatory.      Family History  Problem Relation Age of Onset  . Colon cancer Mother   . Prostate cancer Father   . Cirrhosis Sister     died of GI bleed associated with cirrhosis of the liver  . Stroke Brother   . Diabetes Brother     x3  . Kidney disease Brother   . Irritable bowel syndrome Mother      Review of Systems: General: negative for chills, fever, night sweats or weight changes.  Cardiovascular: see above Dermatological: negative for rash Respiratory: negative for  cough or wheezing Urologic: negative for hematuria Abdominal: negative for nausea, vomiting, diarrhea, bright red blood per rectum, melena, or hematemesis Neurologic: negative for visual changes, syncope, or dizziness All other systems reviewed and are otherwise negative except as noted above.  Labs:  Lab Results  Component Value Date  WBC 8.1 12/24/2013   HGB 11.9* 12/24/2013   HCT 36.6 12/24/2013   MCV 79.7 12/24/2013   PLT 232 12/24/2013    Recent Labs Lab 12/24/13 1148  NA 138  K 4.1  CL 100  CO2 25  BUN 16  CREATININE 1.01  CALCIUM 9.3  GLUCOSE 234*   Lab Results  Component Value Date   CHOL 216* 07/18/2011   HDL 82.30 07/18/2011   LDLCALC 65 11/16/2010   TRIG 89.0 07/18/2011   Troponin neg x 1  Radiology/Studies:  Dg Chest 2 View  12/24/2013   CLINICAL DATA:  Chest pain  EXAM: CHEST  2 VIEW  COMPARISON:  02/16/2013  FINDINGS: CABG changes. Heart size is within normal limits. Negative for heart failure. Negative for pneumonia. Mild atelectasis in the lung bases. Negative for pleural effusion.  IMPRESSION: Very mild bibasilar atelectasis.   Electronically Signed   By: [name] M.D.   On: 12/24/2013 13:20   Ct Angio Chest Pe W/cm &/or Wo Cm  12/24/2013   CLINICAL DATA:  Shortness of breath  EXAM: CT ANGIOGRAPHY CHEST WITH CONTRAST  TECHNIQUE: Multidetector CT imaging of the chest was performed using the standard protocol during bolus administration of intravenous contrast. Multiplanar CT image reconstructions and MIPs were obtained to evaluate the vascular anatomy.  CONTRAST:  130mL OMNIPAQUE IOHEXOL 350 MG/ML SOLN  COMPARISON:  Prior radiograph from earlier the same day.  FINDINGS: The thyroid gland is within normal limits.  No pathologically enlarged mediastinal, hilar, or axillary lymph nodes are identified.  Intrathoracic aorta is of normal caliber. Moderate atherosclerotic calcifications present within the aortic arch. Great vessels within normal limits.  Sequelae of  prior CABG are seen. Small pericardial effusion present.  Pulmonary arterial tree is well opacified. The main pulmonary artery is dilated 3.9 cm, which can be seen with underlying pulmonary hypertension. No filling defects seen to suggest acute pulmonary embolus. Re-formatted imaging confirms these findings.  A small layering left pleural effusion is present. No interlobular septal thickening to suggest pulmonary edema identified. Bibasilar atelectasis is present. No focal infiltrates identified. No pneumothorax.  Visualized portions of the upper abdomen are unremarkable.  No acute osseous abnormality. No worrisome lytic or blastic osseous lesions.  IMPRESSION: 1. No CT evidence of acute pulmonary embolism. 2. Dilatation of the main pulmonary artery up to 3.9 cm, which can be seen with underlying pulmonary hypertension. 3. Small layering left pleural effusion. 4. Small pericardial effusion. 5. Sequelae of prior CABG. 6.   Electronically Signed   By: [name] M.D.   On: 12/24/2013 13:54   EKG: NSR 99bpm, TWI I, avL, V5-V6 (TWI I, V5-V6 new from 04/2011 tracing but similar changes seen in 07/2010 ekg)  Physical Exam: Blood pressure 177/79, pulse 91, temperature 98.9 F (37.2 C), temperature source Oral, resp. rate 13, SpO2 99.00%. General: Well developed, well nourished AAF in no acute distress. Head: Normocephalic, atraumatic, sclera non-icteric, no xanthomas, nares are without discharge.  Neck: Negative for carotid bruits. JVD not elevated. Lungs: Coarse at bases but otherwise clear without wheezes, rales, or rhonchi. Breathing is unlabored. Heart: RRR with S1 S2. No murmurs, rubs, or gallops appreciated. Abdomen: Soft, non-tender, non-distended with normoactive bowel sounds. No hepatomegaly. No rebound/guarding. No obvious abdominal masses. Msk:  Strength and tone appear normal for age. Extremities: No clubbing or cyanosis. No edema.  Distal pedal pulses are 2+ and equal  bilaterally. Neuro: Alert and oriented X 3. No facial asymmetry. No focal deficit. Moves all extremities  spontaneously. Psych:  Responds to questions appropriately with a normal affect.   Assessment and Plan:  1. Chest pain and intermittent hypoxia in ED ? COPD, symptoms atypical for ischemia   Pain does not appear to be cardiac in origin  Most likely pleurisy   She has rhonchi on exam  I think this may be triggered by possible infection or exacerbation of COPD>  2. CAD s/p 2V CABG 06/2010  Again, I am not convinced symptoms represent angina.   3. H/o mitral papillary fibroelastoma s/p resection/MV repair 06/2010 4. Possible pulmonary HTN 5. Small pericardial effusion 6. H/o CVA 7. Recent increase in headaches  Signed, Dayna Dunn PA-C 12/24/2013, 3:02 PM  Patinet seen and examined I have amended note to reflect my findings.  Patients symptoms of chest pain began last wk.  Pleuritic.  Initial trop negative  Exam with rhonchi, symptoms of discomfort worse with deep breath.   EKG changes are not new.  Can check one additional enzyme (non point of care)  Would not pursue further .  I  Would not recomm any further cardiac testing  Rx pulmonary.    Resume meds to control HTN and hyperlipidemia.  WIll make sure that patient has f/u as outpatinet.  Will be available as needed.  Please call  Dorris Carnes

## 2013-12-24 NOTE — ED Notes (Addendum)
Pt resting on stretcher, denies needs/complaints.  NAD.
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
## 2013-12-24 NOTE — ED Notes (Signed)
Reassumed charting at this time in EPIC, refer to downtime forms
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
## 2013-12-24 NOTE — ED Notes (Signed)
Cardiology at bedside.

## 2013-12-24 NOTE — ED Notes (Signed)
Patient transported to X-ray 

## 2013-12-25 DIAGNOSIS — I319 Disease of pericardium, unspecified: Secondary | ICD-10-CM

## 2013-12-25 LAB — BASIC METABOLIC PANEL
Anion gap: 14 (ref 5–15)
BUN: 22 mg/dL (ref 6–23)
BUN: 21 mg/dL (ref 6–23)
BUN: 17 mg/dL (ref 6–23)
BUN: 17 mg/dL (ref 6–23)
CALCIUM: 9.4 mg/dL (ref 8.4–10.5)
CO2: 17 mEq/L — ABNORMAL LOW (ref 19–32)
CO2: 22 mEq/L (ref 19–32)
CO2: 22 mEq/L (ref 19–32)
CO2: 23 mEq/L (ref 19–32)
CREATININE: 0.94 mg/dL (ref 0.50–1.10)
Calcium: 8.7 mg/dL (ref 8.4–10.5)
Calcium: 9 mg/dL (ref 8.4–10.5)
Calcium: 9.3 mg/dL (ref 8.4–10.5)
Chloride: 95 mEq/L — ABNORMAL LOW (ref 96–112)
Chloride: 102 mEq/L (ref 96–112)
Chloride: 99 mEq/L (ref 96–112)
Chloride: 99 mEq/L (ref 96–112)
Creatinine, Ser: 1.13 mg/dL — ABNORMAL HIGH (ref 0.50–1.10)
Creatinine, Ser: 1.05 mg/dL (ref 0.50–1.10)
Creatinine, Ser: 0.9 mg/dL (ref 0.50–1.10)
GFR calc Af Amer: 58 mL/min — ABNORMAL LOW (ref >90)
GFR calc Af Amer: 64 mL/min — ABNORMAL LOW (ref >90)
GFR calc Af Amer: 77 mL/min — ABNORMAL LOW (ref >90)
GFR calc non Af Amer: 50 mL/min — ABNORMAL LOW (ref >90)
GFR calc non Af Amer: 55 mL/min — ABNORMAL LOW (ref >90)
GFR calc non Af Amer: 66 mL/min — ABNORMAL LOW (ref >90)
GFR calc non Af Amer: 63 mL/min — ABNORMAL LOW (ref >90)
GFR, EST AFRICAN AMERICAN: 73 mL/min — AB (ref >90)
Glucose, Bld: 541 mg/dL — ABNORMAL HIGH (ref 70–99)
Glucose, Bld: 277 mg/dL — ABNORMAL HIGH (ref 70–99)
Glucose, Bld: 189 mg/dL — ABNORMAL HIGH (ref 70–99)
Glucose, Bld: 250 mg/dL — ABNORMAL HIGH (ref 70–99)
Potassium: 4.4 mEq/L (ref 3.7–5.3)
Potassium: 4.5 mEq/L (ref 3.7–5.3)
Potassium: 3.8 mEq/L (ref 3.7–5.3)
Potassium: 3.9 mEq/L (ref 3.7–5.3)
Sodium: 133 mEq/L — ABNORMAL LOW (ref 137–147)
Sodium: 138 mEq/L (ref 137–147)
Sodium: 138 mEq/L (ref 137–147)
Sodium: 136 mEq/L — ABNORMAL LOW (ref 137–147)

## 2013-12-25 LAB — CBC
HCT: 32.2 % — ABNORMAL LOW (ref 36.0–46.0)
HEMOGLOBIN: 10.7 g/dL — AB (ref 12.0–15.0)
MCH: 26.2 pg (ref 26.0–34.0)
MCHC: 33.2 g/dL (ref 30.0–36.0)
MCV: 78.7 fL (ref 78.0–100.0)
Platelets: 214 10*3/uL (ref 150–400)
RBC: 4.09 MIL/uL (ref 3.87–5.11)
RDW: 14.4 % (ref 11.5–15.5)
WBC: 7.4 10*3/uL (ref 4.0–10.5)

## 2013-12-25 LAB — GLUCOSE, CAPILLARY
GLUCOSE-CAPILLARY: 444 mg/dL — AB (ref 70–99)
GLUCOSE-CAPILLARY: 117 mg/dL — AB (ref 70–99)
GLUCOSE-CAPILLARY: 188 mg/dL — AB (ref 70–99)
Glucose-Capillary: 378 mg/dL — ABNORMAL HIGH (ref 70–99)
Glucose-Capillary: 322 mg/dL — ABNORMAL HIGH (ref 70–99)
Glucose-Capillary: 174 mg/dL — ABNORMAL HIGH (ref 70–99)
Glucose-Capillary: 157 mg/dL — ABNORMAL HIGH (ref 70–99)
Glucose-Capillary: 173 mg/dL — ABNORMAL HIGH (ref 70–99)
Glucose-Capillary: 163 mg/dL — ABNORMAL HIGH (ref 70–99)
Glucose-Capillary: 238 mg/dL — ABNORMAL HIGH (ref 70–99)

## 2013-12-25 LAB — HEMOGLOBIN A1C
HEMOGLOBIN A1C: 9.5 % — AB (ref <5.7)
Mean Plasma Glucose: 226 mg/dL — ABNORMAL HIGH (ref <117)

## 2013-12-25 LAB — LIPID PANEL
Cholesterol: 217 mg/dL — ABNORMAL HIGH (ref 0–200)
HDL: 118 mg/dL (ref >39)
LDL Cholesterol: 88 mg/dL (ref 0–99)
TRIGLYCERIDES: 53 mg/dL (ref <150)
Total CHOL/HDL Ratio: 1.8 RATIO
VLDL: 11 mg/dL (ref 0–40)

## 2013-12-25 LAB — MRSA PCR SCREENING: MRSA BY PCR: NEGATIVE

## 2013-12-25 LAB — TROPONIN I
Troponin I: <0.3 ng/mL (ref <0.30)
Troponin I: <0.3 ng/mL (ref <0.30)
Troponin I: <0.3 ng/mL (ref <0.30)

## 2013-12-25 MED ORDER — INSULIN GLARGINE 100 UNIT/ML ~~LOC~~ SOLN
10.0000 [IU] | Freq: Every day | SUBCUTANEOUS | Status: DC
  Administered 2013-12-25 – 2013-12-27 (×3): 10 [IU] via SUBCUTANEOUS
  Filled 2013-12-25 (×3): qty 0.1

## 2013-12-25 MED ORDER — ACETAMINOPHEN 500 MG PO TABS: 1000.0000 mg | ORAL_TABLET | ORAL | Status: DC | PRN

## 2013-12-25 MED ORDER — POTASSIUM CHLORIDE 10 MEQ/100ML IV SOLN
10.0000 meq | INTRAVENOUS | Status: AC
  Administered 2013-12-25 (×2): 10 meq via INTRAVENOUS
  Filled 2013-12-25: qty 100

## 2013-12-25 MED ORDER — DEXTROSE-NACL 5-0.45 % IV SOLN
INTRAVENOUS | Status: DC
  Administered 2013-12-25: 08:00:00 via INTRAVENOUS

## 2013-12-25 MED ORDER — DEXTROSE 50 % IV SOLN: 25.0000 mL | INTRAVENOUS | Status: DC | PRN

## 2013-12-25 MED ORDER — HYDRALAZINE HCL 20 MG/ML IJ SOLN
5.0000 mg | Freq: Four times a day (QID) | INTRAMUSCULAR | Status: DC | PRN
  Administered 2013-12-25: 5 mg via INTRAVENOUS
  Filled 2013-12-25: qty 0.25
  Filled 2013-12-25: qty 1

## 2013-12-25 MED ORDER — HYDRALAZINE HCL 25 MG PO TABS
25.0000 mg | ORAL_TABLET | Freq: Three times a day (TID) | ORAL | Status: DC
  Administered 2013-12-25 – 2013-12-27 (×6): 25 mg via ORAL
  Filled 2013-12-25 (×9): qty 1

## 2013-12-25 MED ORDER — LIVING WELL WITH DIABETES BOOK
Freq: Once | Status: AC
  Administered 2013-12-25: 16:00:00
  Filled 2013-12-25: qty 1

## 2013-12-25 MED ORDER — INSULIN ASPART 100 UNIT/ML ~~LOC~~ SOLN
0.0000 [IU] | Freq: Three times a day (TID) | SUBCUTANEOUS | Status: DC
  Administered 2013-12-25: 2 [IU] via SUBCUTANEOUS
  Administered 2013-12-25: 1 [IU] via SUBCUTANEOUS
  Administered 2013-12-26: 2 [IU] via SUBCUTANEOUS
  Administered 2013-12-26: 3 [IU] via SUBCUTANEOUS
  Administered 2013-12-26 – 2013-12-27 (×3): 2 [IU] via SUBCUTANEOUS

## 2013-12-25 MED ORDER — TRAZODONE HCL 50 MG PO TABS
25.0000 mg | ORAL_TABLET | Freq: Every evening | ORAL | Status: DC | PRN
  Administered 2013-12-25 – 2013-12-26 (×2): 25 mg via ORAL
  Filled 2013-12-25 (×2): qty 1

## 2013-12-25 MED ORDER — SODIUM CHLORIDE 0.9 % IV SOLN
INTRAVENOUS | Status: DC
  Administered 2013-12-25: 3.8 [IU]/h via INTRAVENOUS
  Filled 2013-12-25: qty 1

## 2013-12-25 MED ORDER — SODIUM CHLORIDE 0.9 % IV SOLN: INTRAVENOUS | Status: DC

## 2013-12-25 MED ORDER — SODIUM CHLORIDE 0.9 % IV SOLN
INTRAVENOUS | Status: DC
  Administered 2013-12-25: 100 mL via INTRAVENOUS
  Administered 2013-12-25 (×2): via INTRAVENOUS

## 2013-12-25 NOTE — Progress Notes (Addendum)
Clinical Social Work Department BRIEF PSYCHOSOCIAL ASSESSMENT 12/25/2013  Patient:  [name], [name]     Account Number:  [id number]     Admit date:  12/24/2013  Clinical Social Worker:  Lacie Scotts  Date/Time:  12/25/2013 03:19 PM  Referred by:  Physician  Date Referred:  12/25/2013 Referred for  Other - See comment   Other Referral:   COPD GOLD   Interview type:  Patient Other interview type:    PSYCHOSOCIAL DATA Living Status:  HUSBAND Admitted from facility:   Level of care:   Primary support name:  Shanon Brow Primary support relationship to patient:  SPOUSE Degree of support available:   supportive    CURRENT CONCERNS Current Concerns  Other - See comment   Other Concerns:   COPD GOLD    SOCIAL WORK ASSESSMENT / PLAN Pt is a 63 yr old female living at home prior to hospitalization. CSW consulted due to COPD Gold protocol. CSW met with pt to complete anxiety / depression questionnaire. Completed forms are in shadow chart. Pt does experience depressed mood and anxiety at times. Pt states she was referred to counseling following her stroke a few yrs ago. Pt expressed an interest in receiving resources and would like to return to counseling for additional support.  Pt also requesting assistance with arranging for a PCP, and possibly assistance with [employer] [facility] [employer] services. RNCM has been alerted.   Assessment/plan status:  Signing off  Other assessment/ plan:   Information/referral to community resources:   MH resources provided.    PATIENT'S/FAMILY'S RESPONSE TO PLAN OF CARE: Pt is feeling better and looking forward to transferring off of step down unit. " I was scared last night . Staff was very reassuring. " Pt is interested in supportive counseling and looking forward to returning to her home when she is stable.   Werner Lean LCSW [phone]
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
## 2013-12-25 NOTE — Progress Notes (Signed)
PT Cancellation Note  Patient Details Name: [name] MRN: [id number] DOB: Dec 10, 1950   Cancelled Treatment:    Reason Eval/Treat Not Completed: Medical issues which prohibited therapy (BP too high)Check back in AM.   Claretha Cooper 12/25/2013, 4:26 PM

## 2013-12-25 NOTE — Progress Notes (Signed)
Echocardiogram 2D Echocardiogram has been performed.  [name] 12/25/2013, 9:20 AM

## 2013-12-25 NOTE — Progress Notes (Signed)
Inpatient Diabetes Program Recommendations  AACE/ADA: New Consensus Statement on Inpatient Glycemic Control (2013)  Target Ranges:  Prepandial:   less than 140 mg/dL      Peak postprandial:   less than 180 mg/dL (1-2 hours)      Critically ill patients:  140 - 180 mg/dL   Reason for Visit: Diabetes Consult  Diabetes history: DM2 Outpatient Diabetes medications: None, previously on metformin 850 mg bid Current orders for Inpatient glycemic control: Lantus 10 units QAM, Novolog sensitive tidwc, metformin 850 mg bid Transitioned off insulin drip to SQ insulin.  Pt states her insurance did not allow her to continue seeing her PCP and needed to find another one. Made several attempts to secure a PCP but was unable to. Has not taken meds x 1 month.  Pt states she needs glucose meter and supplies prior to discharge. Has "fear of needles" and does not want to go home on insulin. Discussed insulin pens and pt seems willing to try this.  Will teach insulin pen administration tomorrow if care manager states her insurance will cover pens. Will order consult. Pt seems very motivated to make lifestyle changes to control her diabetes. Will need PCP when discharged for diabetes management.  Inpatient Diabetes Program Recommendations Insulin - Basal: Increase Lantus to 20 units QAM (.2xkg) Correction (SSI): Increase Novolog to moderate tidwc and hs Insulin - Meal Coverage: If post-prandial blood sugars >180 mg/dL, add Novolog 3 units tidwc Oral Agents: Consider addition of tradjenta 5 mg QD HgbA1C: 9.5% - uncontrolled Outpatient Referral: OP Diabetes Education consult for uncontrolled DM  Note: Will order Living Well With Diabetes book. Care manager consult for PCP and coverage of insulin pens on pt's private insurance.  Will follow closely. Lorenda Peck, RD, LDN, CDE Inpatient Diabetes Coordinator [phone]

## 2013-12-25 NOTE — Progress Notes (Signed)
PROGRESS NOTE  [name] [id number] DOB: [birth year] DOA: 12/24/2013 PCP: [name], MD  HPI: 63 year old female with past medical history of mitral valve papillary fibroelastoma status post MV repair, was on coumadin but this was stopped since 2013, CABG in 2012, history of CVA (right distal MCA stenosis), diabetes, ran out of medications for past 1 month. She presented to [facility] ED 12/24/2013 with worsening left sided chest pain for past 1 week prior to this admission associated with worsening shortness of breath especially on exertion and also with deep inspiration.   Assessment/Plan:  Acute respiratory failure with hypoxia / COPD exacerbation  - Possible COPD exacerbation but CXR on admission showed only very mild bibasilar atelectasis  - she is on room air this morning, improving.  - We will continue BD treatment, duoneb every 4 hours scheduled and as needed  - Oxygen support via [place] to keep O2 saturation above 90%; Dr. Charlies Silvers spoke with Dr. Halford Chessman of PCCM on admission - since pt resp status is stable at this time no specific recommendations from PCCM.   Chest pain / small pericardial efusion  - Possibly related to hypoxia, cardiac enzymes normal, cardiology evaluated patient - 2D echo pending  - Continue aspirin and atorvastatin   DM w/o Complication Type II  - [id number] is 9.5, patient does NOT want to start insulin, will restart metformin since she has been off of it for 1 month.   ANEMIA-IRON DEFICIENCY - No current indications for transfusion   Essential hypertension - Per cardiology, started nitroglycerin drip initially.  - restarted home losartan and norvasc, monitor and titrate as indicated. She has been off of her BP meds for a month.   HYPERLIPIDEMIA - Continue atorvastatin 20 mg daily   Diet: carb modified Fluids: NS at 100 cc/h DVT Prophylaxis: Lovenox  Code Status: Full Family Communication: d/w patient  Disposition Plan: floor transfer later today    Consultants:  Cardiology   Procedures:  None    Antibiotics - none   HPI/Subjective: No complaints, feels much better this morning, very talkative.   Objective: Filed Vitals:   12/25/13 0758 12/25/13 0800 12/25/13 0853 12/25/13 0936  BP:  176/74 198/79 174/71  Pulse:  76  84  Temp:  97.9 F (36.6 C)    TempSrc:  Oral    Resp:  12  16  Height:      Weight:      SpO2: 100% 100%  98%    Intake/Output Summary (Last 24 hours) at 12/25/13 1131 Last data filed at 12/25/13 1000  Gross per 24 hour  Intake   1243 ml  Output      0 ml  Net   1243 ml   Filed Weights   12/24/13 1937 12/24/13 2228  Weight: 111.585 kg (246 lb) 112.8 kg (248 lb 10.9 oz)    Exam:   General:  NAD  Cardiovascular: regular rate and rhythm, without MRG  Respiratory: good air movement, clear to auscultation throughout, no wheezing, ronchi or rales  Abdomen: soft, not tender to palpation, positive bowel sounds  MSK: no peripheral edema  Neuro: non focal  Data Reviewed: Basic Metabolic Panel:  Recent Labs Lab 12/24/13 1148 12/24/13 2302 12/25/13 0520 12/25/13 1022  NA 138 133* 138 138  K 4.1 4.4 4.5 3.8  CL 100 95* 102 99  CO2 25 17* 22 22  GLUCOSE 234* 541* 277* 189*  BUN 16 22 21 17   CREATININE 1.01 1.13* 1.05 0.90  CALCIUM 9.3 8.7 9.0 9.3   Liver Function Tests:  Recent Labs Lab 12/24/13 1601  AST 13  ALT 9  ALKPHOS 63  BILITOT 0.3  PROT 6.5  ALBUMIN 3.2*   No results found for this basename: LIPASE, AMYLASE,  in the last 168 hours No results found for this basename: AMMONIA,  in the last 168 hours CBC:  Recent Labs Lab 12/24/13 1148 12/25/13 0500  WBC 8.1 7.4  HGB 11.9* 10.7*  HCT 36.6 32.2*  MCV 79.7 78.7  PLT 232 214   Cardiac Enzymes:  Recent Labs Lab 12/24/13 1601 12/24/13 2330 12/25/13 0427 12/25/13 1022  TROPONINI <0.30 <0.30 <0.30 <0.30   BNP (last 3 results)  Recent Labs  12/24/13 1601  PROBNP 460.7*   CBG:  Recent  Labs Lab 12/25/13 0403 12/25/13 0627 12/25/13 0736 12/25/13 0849 12/25/13 0953  GLUCAP 322* 174* 117* 157* 188*    Recent Results (from the past 240 hour(s))  MRSA PCR SCREENING     Status: None   Collection Time    12/24/13 10:58 PM      Result Value Ref Range Status   MRSA by PCR NEGATIVE  NEGATIVE Final   Comment:            The GeneXpert MRSA Assay (FDA     approved for NASAL specimens     only), is one component of a     comprehensive MRSA colonization     surveillance program. It is not     intended to diagnose MRSA     infection nor to guide or     monitor treatment for     MRSA infections.     Performed at [facility]     Studies: Dg Chest 2 View  12/24/2013   CLINICAL DATA:  Chest pain  EXAM: CHEST  2 VIEW  COMPARISON:  02/16/2013  FINDINGS: CABG changes. Heart size is within normal limits. Negative for heart failure. Negative for pneumonia. Mild atelectasis in the lung bases. Negative for pleural effusion.  IMPRESSION: Very mild bibasilar atelectasis.   Electronically Signed   By: [name] M.D.   On: 12/24/2013 13:20   Ct Angio Chest Pe W/cm &/or Wo Cm  12/24/2013   CLINICAL DATA:  Shortness of breath  EXAM: CT ANGIOGRAPHY CHEST WITH CONTRAST  TECHNIQUE: Multidetector CT imaging of the chest was performed using the standard protocol during bolus administration of intravenous contrast. Multiplanar CT image reconstructions and MIPs were obtained to evaluate the vascular anatomy.  CONTRAST:  144mL OMNIPAQUE IOHEXOL 350 MG/ML SOLN  COMPARISON:  Prior radiograph from earlier the same day.  FINDINGS: The thyroid gland is within normal limits.  No pathologically enlarged mediastinal, hilar, or axillary lymph nodes are identified.  Intrathoracic aorta is of normal caliber. Moderate atherosclerotic calcifications present within the aortic arch. Great vessels within normal limits.  Sequelae of prior CABG are seen. Small pericardial effusion present.  Pulmonary arterial  tree is well opacified. The main pulmonary artery is dilated 3.9 cm, which can be seen with underlying pulmonary hypertension. No filling defects seen to suggest acute pulmonary embolus. Re-formatted imaging confirms these findings.  A small layering left pleural effusion is present. No interlobular septal thickening to suggest pulmonary edema identified. Bibasilar atelectasis is present. No focal infiltrates identified. No pneumothorax.  Visualized portions of the upper abdomen are unremarkable.  No acute osseous abnormality. No worrisome lytic or blastic osseous lesions.  IMPRESSION: 1. No CT evidence of acute pulmonary embolism.  2. Dilatation of the main pulmonary artery up to 3.9 cm, which can be seen with underlying pulmonary hypertension. 3. Small layering left pleural effusion. 4. Small pericardial effusion. 5. Sequelae of prior CABG. 6.   Electronically Signed   By: [name] M.D.   On: 12/24/2013 13:54    Scheduled Meds: . amLODipine  10 mg Oral Daily  . aspirin EC  81 mg Oral Daily  . atorvastatin  20 mg Oral Daily  . enoxaparin (LOVENOX) injection  0.5 mg/kg Subcutaneous Q24H  . indapamide  1.25 mg Oral q morning - 10a  . insulin aspart  0-9 Units Subcutaneous TID WC  . insulin glargine  10 Units Subcutaneous Daily  . ipratropium-albuterol  3 mL Nebulization TID  . losartan  100 mg Oral Daily  . metFORMIN  850 mg Oral Q breakfast  . sodium chloride  3 mL Intravenous Q12H   Continuous Infusions: . sodium chloride 100 mL/hr at 12/25/13 1000  . dextrose 5 % and 0.45% NaCl Stopped (12/25/13 1000)  . insulin (NOVOLIN-R) infusion Stopped (12/25/13 0954)    Principal Problem:   Acute respiratory failure with hypoxia Active Problems:   DM w/o Complication Type II   ANEMIA-IRON DEFICIENCY   Unspecified essential hypertension   HYPERLIPIDEMIA   S/P mitral valve repair   COPD (chronic obstructive pulmonary disease)   Time spent: 35  This note has been created with Scientist, clinical (histocompatibility and immunogenetics). Any transcriptional errors are unintentional.   Marzetta Board, MD [facility] Pager [phone]. If 7 PM - 7 AM, please contact night-coverage at [email or web address], password [facility] 12/25/2013, 11:31 AM  LOS: 1 day

## 2013-12-26 LAB — BASIC METABOLIC PANEL
ANION GAP: 11 (ref 5–15)
BUN: 16 mg/dL (ref 6–23)
CO2: 24 meq/L (ref 19–32)
Calcium: 8.8 mg/dL (ref 8.4–10.5)
Chloride: 105 mEq/L (ref 96–112)
Creatinine, Ser: 0.94 mg/dL (ref 0.50–1.10)
GFR calc Af Amer: 73 mL/min — ABNORMAL LOW (ref >90)
GFR calc non Af Amer: 63 mL/min — ABNORMAL LOW (ref >90)
GLUCOSE: 178 mg/dL — AB (ref 70–99)
POTASSIUM: 4.1 meq/L (ref 3.7–5.3)
SODIUM: 140 meq/L (ref 137–147)

## 2013-12-26 LAB — GLUCOSE, CAPILLARY
GLUCOSE-CAPILLARY: 151 mg/dL — AB (ref 70–99)
Glucose-Capillary: 198 mg/dL — ABNORMAL HIGH (ref 70–99)
Glucose-Capillary: 201 mg/dL — ABNORMAL HIGH (ref 70–99)
Glucose-Capillary: 204 mg/dL — ABNORMAL HIGH (ref 70–99)

## 2013-12-26 MED ORDER — METOCLOPRAMIDE HCL 10 MG PO TABS
10.0000 mg | ORAL_TABLET | Freq: Once | ORAL | Status: AC
  Administered 2013-12-26: 10 mg via ORAL
  Filled 2013-12-26: qty 1

## 2013-12-26 MED ORDER — METOCLOPRAMIDE HCL 10 MG PO TABS
10.0000 mg | ORAL_TABLET | Freq: Three times a day (TID) | ORAL | Status: DC
  Administered 2013-12-27 (×2): 10 mg via ORAL
  Filled 2013-12-26 (×5): qty 1

## 2013-12-26 MED ORDER — IPRATROPIUM-ALBUTEROL 0.5-2.5 (3) MG/3ML IN SOLN
3.0000 mL | Freq: Four times a day (QID) | RESPIRATORY_TRACT | Status: DC
  Administered 2013-12-26 – 2013-12-27 (×3): 3 mL via RESPIRATORY_TRACT
  Filled 2013-12-26 (×3): qty 3

## 2013-12-26 NOTE — Evaluation (Signed)
Occupational Therapy Evaluation Patient Details Name: [name] MRN: [id number] DOB: 1951/04/02 Today's Date: 12/26/2013    History of Present Illness Pt was admitted for acute respiratory failure with hypoxia.  PMH is significant for MV repair, CABG, CVA and DM   Clinical Impression   This 63 year old female was admitted for the above.  She is close to baseline for adls, however, she has recently had dysequilibrium with falls/near falls when walking to bathroom.  OT will focus on safety and balance to increase independence with this.  Only performed SPT today.  Recommended that she use BSC at home when alone, but pt may not be agreeable to this.  Will also work on ambulating to bathroom. Goals are for mod I to supervision.    Follow Up Recommendations  Home health OT (with adaptations to minimize fall risk;would benefit from OP)    Equipment Recommendations  Other (comment) (recommend shower seat vs. tub bench.  HHOT can further asses)    Recommendations for Other Services       Precautions / Restrictions Precautions Precautions: Fall Restrictions Weight Bearing Restrictions: No      Mobility Bed Mobility                  Transfers Overall transfer level: Needs assistance Equipment used: None Transfers: Sit to/from Stand;Stand Pivot Transfers Sit to Stand: Supervision Stand pivot transfers: Min guard       General transfer comment: for safety.  Pt held onto bed rail and chair.  Needs bil UEs to push up    Balance Overall balance assessment: History of Falls                                          ADL Overall ADL's : Needs assistance/impaired     Grooming: Set up;Sitting   Upper Body Bathing: Set up;Sitting   Lower Body Bathing: Supervison/ safety;Sit to/from stand   Upper Body Dressing : Set up;Sitting   Lower Body Dressing: Supervision/safety;Sit to/from stand   Toilet Transfer: Min guard;Stand-pivot   Toileting-  Water quality scientist and Hygiene: Supervision/safety;Sit to/from stand         General ADL Comments: Discussed safety at home:  pt is not willing to try RW. She reports she mostly falls to side.  She has a 3:1 commode and is willing to put it over commode to make rising easier.  Also discussed using next to bed/chair in day with pine cleaner in bucket.  She will think about this.  Educated on tub seat/bench, but this is an out of pocket expense for her.  Also, educated on energy conservation.  Pt reports taking rest breaks at home and breaking activities up.  She reports that she doesn't do much other than basic ADLs     Vision                     Perception     Praxis      Pertinent Vitals/Pain No c/o pain     Hand Dominance     Extremity/Trunk Assessment Upper Extremity Assessment Upper Extremity Assessment: Overall WFL for tasks assessed           Communication Communication Communication: No difficulties   Cognition Arousal/Alertness: Awake/alert Behavior During Therapy: WFL for tasks assessed/performed Overall Cognitive Status: Within Functional Limits for tasks assessed  General Comments       Exercises       Shoulder Instructions      Home Living Family/patient expects to be discharged to:: Private residence Living Arrangements: Spouse/significant other Available Help at Discharge: Family;Available PRN/intermittently               Bathroom Shower/Tub: Tub/shower unit   Bathroom Toilet: Standard     Home Equipment: Cane - single point          Prior Functioning/Environment Level of Independence: Needs assistance;Independent with assistive device(s)        Comments: mod I ambulating but falls; assist with ADLs from husband intermittently and he is near when she showers    OT Diagnosis: Generalized weakness   OT Problem List: Decreased strength;Decreased activity tolerance;Impaired balance (sitting  and/or standing);Decreased knowledge of use of DME or AE   OT Treatment/Interventions: Self-care/ADL training;DME and/or AE instruction;Energy conservation;Balance training;Patient/family education    OT Goals(Current goals can be found in the care plan section) Acute Rehab OT Goals Patient Stated Goal: agreeable to OT.  No specific goal stated OT Goal Formulation: With patient Time For Goal Achievement: 01/09/14 Potential to Achieve Goals: Good ADL Goals Pt Will Perform Grooming: with supervision;standing Pt Will Transfer to Toilet: with supervision;bedside commode;ambulating Additional ADL Goal #1: pt will perform SPT to [facility] at mod I level  OT Frequency: Min 2X/week   Barriers to D/C:            Co-evaluation              End of Session    Activity Tolerance: Patient tolerated treatment well Patient left: in chair;with call bell/phone within reach;with chair alarm set   Time: [id number] OT Time Calculation (min): 20 min Charges:  OT General Charges $OT Visit: 1 Procedure OT Evaluation $Initial OT Evaluation Tier I: 1 Procedure OT Treatments $Self Care/Home Management : 8-22 mins G-Codes:    [name] 2014-01-06, 9:36 AM  Lesle Chris, OTR/L [phone] January 06, 2014

## 2013-12-26 NOTE — Evaluation (Signed)
Physical Therapy Evaluation Patient Details Name: [name] MRN: [id number] DOB: [birth year] Today's Date: 12/26/2013   History of Present Illness  Pt was admitted for acute respiratory failure with hypoxia.  PMH is significant for MV repair, CABG, CVA and DM  Clinical Impression  Pt ambulated in hall, HR 121, sats 100%. Pt will benefit from PT to address problems listed in note.     Follow Up Recommendations No PT follow up    Equipment Recommendations  None recommended by PT    Recommendations for Other Services       Precautions / Restrictions Precautions Precautions: Fall Precaution Comments: monitor VS      Mobility  Bed Mobility                  Transfers Overall transfer level: Needs assistance Equipment used: None Transfers: Sit to/from Stand Sit to Stand: Supervision Stand pivot transfers: Min guard       General transfer comment: for safety.  Pt held onto bed rail and chair.  Needs bil UEs to push up  Ambulation/Gait Ambulation/Gait assistance: Min guard Ambulation Distance (Feet): 300 Feet Assistive device: 1 person hand held assist Gait Pattern/deviations: Step-through pattern     General Gait Details: laterally shifts trunk to L, had  ankle fx in past.  Stairs            Wheelchair Mobility    Modified Rankin (Stroke Patients Only)       Balance Overall balance assessment: History of Falls                                           Pertinent Vitals/Pain Pre 100-121 Sats100%-98%    Home Living Family/patient expects to be discharged to:: Private residence Living Arrangements: Spouse/significant other Available Help at Discharge: Family;Available PRN/intermittently Type of Home: House Home Access: Stairs to enter       Home Equipment: Kasandra Knudsen - single point      Prior Function Level of Independence: Needs assistance;Independent with assistive device(s)         Comments: mod I ambulating but  falls; assist with ADLs from husband intermittently and he is near when she showers     Hand Dominance        Extremity/Trunk Assessment   Upper Extremity Assessment: Overall WFL for tasks assessed           Lower Extremity Assessment: Overall WFL for tasks assessed         Communication      Cognition Arousal/Alertness: Awake/alert Behavior During Therapy: WFL for tasks assessed/performed Overall Cognitive Status: Within Functional Limits for tasks assessed                      General Comments      Exercises        Assessment/Plan    PT Assessment Patient needs continued PT services  PT Diagnosis Difficulty walking   PT Problem List Decreased mobility  PT Treatment Interventions Gait training;Functional mobility training;Therapeutic activities   PT Goals (Current goals can be found in the Care Plan section) Acute Rehab PT Goals Patient Stated Goal: wants to go home PT Goal Formulation: With patient Time For Goal Achievement: 12/26/13 Potential to Achieve Goals: Good    Frequency Min 3X/week   Barriers to discharge        Co-evaluation  End of Session Equipment Utilized During Treatment: Gait belt Activity Tolerance: Patient tolerated treatment well Patient left: in chair;with call bell/phone within reach;with chair alarm set;with family/visitor present Nurse Communication: Mobility status         Time: 1445-1506 PT Time Calculation (min): 21 min   Charges:   PT Evaluation $Initial PT Evaluation Tier I: 1 Procedure PT Treatments $Gait Training: 8-22 mins   PT G Codes:          Claretha Cooper 12/26/2013, 3:56 PM Tresa Endo PT [phone]

## 2013-12-26 NOTE — Progress Notes (Signed)
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 She presented to [facility] ED 12/24/2013 with worsening left sided chest pain for past 1 week prior to this admission associated with worsening shortness of breath especially on exertion and also with deep inspiration.   Assessment/Plan:  Acute respiratory failure with hypoxia / COPD exacerbation  - Possible COPD exacerbation but CXR on admission showed only very mild bibasilar atelectasis  - she is on room air this morning, improving.  - We will continue BD treatment, duoneb every 4 hours scheduled and as needed  - Oxygen support via [place] to keep O2 saturation above 90%; Dr. Charlies Silvers spoke with Dr. Halford Chessman of PCCM on admission - since pt resp status is stable at this time no specific recommendations from PCCM.   Chest pain / small pericardial efusion  - Possibly related to hypoxia, cardiac enzymes normal, cardiology evaluated patient - 2D echo EF normal, grade 2 diastolic - Continue aspirin and atorvastatin   DM w/o Complication Type II  - [id number] is 9.5, patient does NOT want to start insulin, will restart metformin since she has been off of it for 1 month.   ANEMIA-IRON DEFICIENCY - No current indications for transfusion   Essential hypertension - Per cardiology, started nitroglycerin drip initially.  - restarted home losartan and norvasc, monitor and titrate as indicated. She has been off of her BP meds for a month.   HYPERLIPIDEMIA - Continue atorvastatin 20 mg daily   Diet: carb modified Fluids: NS at 100 cc/h DVT Prophylaxis: Lovenox  Code Status: Full Family Communication: d/w patient  Disposition Plan: floor transfer  later today   Consultants:  Cardiology   Procedures:  2D echo   Antibiotics - none   HPI/Subjective: Feels well, breathing better   Objective: Filed Vitals:   12/26/13 1034 12/26/13 1333 12/26/13 1405 12/26/13 1437  BP:  160/72 153/80   Pulse:   95   Temp:   98.5 F (36.9 C)   TempSrc:   Oral   Resp:   18   Height:      Weight:      SpO2: 98%  99% 97%    Intake/Output Summary (Last 24 hours) at 12/26/13 1546 Last data filed at 12/26/13 1300  Gross per 24 hour  Intake    840 ml  Output      0 ml  Net    840 ml   Filed Weights   12/24/13 1937 12/24/13 2228 12/25/13 1800  Weight: 111.585 kg (246 lb) 112.8 kg (248 lb 10.9 oz) 112.8 kg (248 lb 10.9 oz)    Exam:  General:  NAD  Cardiovascular: regular rate and rhythm, without MRG  Respiratory: good air movement, clear to auscultation throughout, no wheezing, ronchi or rales  Abdomen: soft, not tender to palpation, positive bowel sounds  MSK: no peripheral edema  Neuro: non focal  Data Reviewed: Basic Metabolic Panel:  Recent Labs Lab 12/24/13 2302 12/25/13 0520 12/25/13 1022 12/25/13 1825 12/26/13 0622  NA 133* 138 138 136* 140  K 4.4 4.5 3.8 3.9 4.1  CL 95* 102 99 99 105  CO2 17* 22 22 23 24   GLUCOSE 541* 277* 189* 250*  178*  BUN 22 21 17 17 16   CREATININE 1.13* 1.05 0.90 0.94 0.94  CALCIUM 8.7 9.0 9.3 9.4 8.8   Liver Function Tests:  Recent Labs Lab 12/24/13 1601  AST 13  ALT 9  ALKPHOS 63  BILITOT 0.3  PROT 6.5  ALBUMIN 3.2*   CBC:  Recent Labs Lab 12/24/13 1148 12/25/13 0500  WBC 8.1 7.4  HGB 11.9* 10.7*  HCT 36.6 32.2*  MCV 79.7 78.7  PLT 232 214   Cardiac Enzymes:  Recent Labs Lab 12/24/13 1601 12/24/13 2330 12/25/13 0427 12/25/13 1022  TROPONINI <0.30 <0.30 <0.30 <0.30   BNP (last 3 results)  Recent Labs  12/24/13 1601  PROBNP 460.7*   CBG:  Recent Labs Lab 12/25/13 1117 12/25/13 1642 12/25/13 2142 12/26/13 0746 12/26/13 1158  GLUCAP 173*  163* 238* 151* 198*    Recent Results (from the past 240 hour(s))  MRSA PCR SCREENING     Status: None   Collection Time    12/24/13 10:58 PM      Result Value Ref Range Status   MRSA by PCR NEGATIVE  NEGATIVE Final   Comment:            The GeneXpert MRSA Assay (FDA     approved for NASAL specimens     only), is one component of a     comprehensive MRSA colonization     surveillance program. It is not     intended to diagnose MRSA     infection nor to guide or     monitor treatment for     MRSA infections.     Performed at [facility]     Studies: No results found.  Scheduled Meds: . amLODipine  10 mg Oral Daily  . aspirin EC  81 mg Oral Daily  . atorvastatin  20 mg Oral Daily  . enoxaparin (LOVENOX) injection  0.5 mg/kg Subcutaneous Q24H  . hydrALAZINE  25 mg Oral 3 times per day  . indapamide  1.25 mg Oral q morning - 10a  . insulin aspart  0-9 Units Subcutaneous TID WC  . insulin glargine  10 Units Subcutaneous Daily  . ipratropium-albuterol  3 mL Nebulization QID  . losartan  100 mg Oral Daily  . metFORMIN  850 mg Oral Q breakfast  . sodium chloride  3 mL Intravenous Q12H   Continuous Infusions: . dextrose 5 % and 0.45% NaCl Stopped (12/25/13 1000)   Principal Problem:   Acute respiratory failure with hypoxia Active Problems:   DM w/o Complication Type II   ANEMIA-IRON DEFICIENCY   Unspecified essential hypertension   HYPERLIPIDEMIA   S/P mitral valve repair   COPD (chronic obstructive pulmonary disease)  Time spent: 25  This note has been created with Surveyor, quantity. Any transcriptional errors are unintentional.   Marzetta Board, MD [facility] Pager [phone]. If 7 PM - 7 AM, please contact night-coverage at [email or web address], password [facility] 12/26/2013, 3:46 PM  LOS: 2 days

## 2013-12-27 LAB — GLUCOSE, CAPILLARY
GLUCOSE-CAPILLARY: 169 mg/dL — AB (ref 70–99)
Glucose-Capillary: 157 mg/dL — ABNORMAL HIGH (ref 70–99)

## 2013-12-27 MED ORDER — TRAMADOL HCL 50 MG PO TABS: 50.0000 mg | ORAL_TABLET | Freq: Three times a day (TID) | ORAL | Status: DC | PRN

## 2013-12-27 MED ORDER — INDAPAMIDE 1.25 MG PO TABS: 1.2500 mg | ORAL_TABLET | Freq: Every morning | ORAL | Status: DC

## 2013-12-27 MED ORDER — ASPIRIN EC 81 MG PO TBEC: 81.0000 mg | DELAYED_RELEASE_TABLET | Freq: Every day | ORAL | Status: DC

## 2013-12-27 MED ORDER — PANTOPRAZOLE SODIUM 40 MG PO TBEC: 40.0000 mg | DELAYED_RELEASE_TABLET | Freq: Every day | ORAL | Status: DC

## 2013-12-27 MED ORDER — ALBUTEROL SULFATE HFA 108 (90 BASE) MCG/ACT IN AERS: 2.0000 | INHALATION_SPRAY | Freq: Four times a day (QID) | RESPIRATORY_TRACT | Status: DC | PRN

## 2013-12-27 MED ORDER — AMLODIPINE BESYLATE 10 MG PO TABS: 10.0000 mg | ORAL_TABLET | Freq: Every day | ORAL | Status: DC

## 2013-12-27 MED ORDER — CALCIUM CITRATE-VITAMIN D 250-100 MG-UNIT PO TABS: 1.0000 | ORAL_TABLET | Freq: Every day | ORAL | Status: DC

## 2013-12-27 MED ORDER — METFORMIN HCL 850 MG PO TABS: 850.0000 mg | ORAL_TABLET | Freq: Two times a day (BID) | ORAL | Status: DC

## 2013-12-27 MED ORDER — LOSARTAN POTASSIUM 100 MG PO TABS: 100.0000 mg | ORAL_TABLET | Freq: Every day | ORAL | Status: DC

## 2013-12-27 MED ORDER — TRAZODONE 25 MG HALF TABLET: 25.0000 mg | ORAL_TABLET | Freq: Every evening | ORAL | Status: DC | PRN

## 2013-12-27 MED ORDER — ATORVASTATIN CALCIUM 20 MG PO TABS: 20.0000 mg | ORAL_TABLET | Freq: Every day | ORAL | Status: DC

## 2013-12-27 MED ORDER — HYDRALAZINE HCL 25 MG PO TABS: 25.0000 mg | ORAL_TABLET | Freq: Three times a day (TID) | ORAL | Status: DC

## 2013-12-27 MED ORDER — METOCLOPRAMIDE HCL 10 MG PO TABS: 10.0000 mg | ORAL_TABLET | Freq: Three times a day (TID) | ORAL | Status: DC

## 2013-12-27 MED ORDER — LINAGLIPTIN 5 MG PO TABS: 5.0000 mg | ORAL_TABLET | Freq: Every day | ORAL | Status: DC

## 2013-12-27 NOTE — Progress Notes (Signed)
Information given to pt for [facility] and [facility]. Pt states that she will go there on Monday for an appointment.

## 2013-12-27 NOTE — Discharge Instructions (Signed)
You were cared for by a hospitalist during your hospital stay. If you have any questions about your discharge medications or the care you received while you were in the hospital after you are discharged, you can call the unit and asked to speak with the hospitalist on call if the hospitalist that took care of you is not available. Once you are discharged, your primary care physician will handle any further medical issues. Please note that NO REFILLS for any discharge medications will be authorized once you are discharged, as it is imperative that you return to your primary care physician (or establish a relationship with a primary care physician if you do not have one) for your aftercare needs so that they can reassess your need for medications and monitor your lab values.     If you do not have a primary care physician, you can call [phone] for a physician referral.  Follow with Primary MD in 1-2 weeks  Accuchecks 4 times/day, Once in AM empty stomach and then before each meal. Log in all results and show them to your Prim.MD. If any glucose reading is under 80 or above 300 call your Prim MD immediately. Follow Low glucose instructions for glucose under 80 as instructed.   Get CBC, CMP checked by your doctor and again as further instructed.  Get a 2 view Chest X ray done next visit if you had Pneumonia of Lung problems at the [place] reviewed and adjusted.  Please request your Prim.MD to go over all Hospital Tests and Procedure/Radiological results at the follow up, please get all Hospital records sent to your Prim MD by signing hospital release before you go home.  Activity: As tolerated with Full fall precautions use walker/cane & assistance as needed  Diet: diabetic  For Heart failure patients - Check your Weight same time everyday, if you gain over 2 pounds, or you develop in leg swelling, experience more shortness of breath or chest pain, call your Primary MD  immediately. Follow Cardiac Low Salt Diet and 1.8 lit/day fluid restriction.  Disposition Home  If you experience worsening of your admission symptoms, develop shortness of breath, life threatening emergency, suicidal or homicidal thoughts you must seek medical attention immediately by calling 911 or calling your MD immediately  if symptoms less severe.  You Must read complete instructions/literature along with all the possible adverse reactions/side effects for all the Medicines you take and that have been prescribed to you. Take any new Medicines after you have completely understood and accpet all the possible adverse reactions/side effects.   Do not drive and provide baby sitting services if your were admitted for syncope or siezures until you have seen by Primary MD or a Neurologist and advised to do so again.  Do not drive when taking Pain medications.   Do not take more than prescribed Pain, Sleep and Anxiety Medications  Special Instructions: If you have smoked or chewed Tobacco  in the last 2 yrs please stop smoking, stop any regular Alcohol  and or any Recreational drug use.  Wear Seat belts while driving.

## 2013-12-28 NOTE — Discharge Summary (Signed)
Physician Discharge Summary  ALLISYN SCHEIBNER [id number] DOB: 1951/06/14 DOA: 12/24/2013  PCP: [name], MD  Admit date: 12/24/2013 Discharge date: 12/28/2013  Time spent: 35 minutes  Recommendations for Outpatient Follow-up:  1. Follow up with PCP in 1-2 weeks   Recommendations for primary care physician for things to follow:  Check CBGs  Discharge Diagnoses:  Principal Problem:   Acute respiratory failure with hypoxia Active Problems:   DM w/o Complication Type II   ANEMIA-IRON DEFICIENCY   Unspecified essential hypertension   HYPERLIPIDEMIA   S/P mitral valve repair   COPD (chronic obstructive pulmonary disease)   Discharge Condition: stable  Diet recommendation: heart healthy. 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 She presented to [facility] ED 12/24/2013 with worsening left sided chest pain for past 1 week prior to this admission associated with worsening shortness of breath especially on exertion and also with deep inspiration.   Hospital Course:  Acute respiratory failure with hypoxia / COPD exacerbation - Possible COPD exacerbation but CXR on admission showed only very mild bibasilar atelectasis, patient improved rapidly with breathing treatments alone, was able to be weaned off oxygen within 24 hours, she received one dose of steroids in the emergency room, however had no wheezing and steroids were not continued. It was not felt that she has active infection, and she was not on antibiotics. Her presentation is likely due to the fact that she ran out of all of her  medications. Per patient, she was no longer able to see her primary care provider per her insurance company, however could not get an appointment with any of the other recommended primary care physicians in time and she was unable to get any of her chronic medications refilled. Chest pain / small pericardial efusion - Possibly related to hypoxia, cardiac enzymes normal, cardiology evaluated patient while hospitalized. She had a 2-D echo which showed normal ejection fraction and grade 2 diastolic dysfunction, and patient was counseled for low salt intake and daily weights. Continue her previous medications. DM w/o Complication Type II, uncontrolled.  A1C is 9.5, patient does NOT want to start insulin, will restart metformin since she has been off of it for 1 month. Her regimen has been supplemented by [facility], and she was advised to check her CBGs at least 2 times a day before breakfast and before lunch or dinner, have her sugar log and see her primary care provider in one to 2 weeks. ANEMIA-IRON DEFICIENCY - No current indications for transfusion  Essential hypertension - restarted her home medications HYPERLIPIDEMIA - Continue atorvastatin 20 mg daily  Procedures:  2-D echo  Study Conclusions  - Left ventricle: The cavity size was normal. Wall thickness was normal. Systolic function was vigorous. The estimated ejection fraction was in the range of 65% to 70%. Wall motion was normal; there were no regional wall motion abnormalities. Features are consistent with a pseudonormal left ventricular filling pattern, with concomitant abnormal relaxation and increased filling pressure (grade 2 diastolic dysfunction). - Aortic valve: Not well visualized. Mildly calcified annulus. - Mitral  valve: Calcified annulus. Mildly thickened leaflets. Records indicate prior mitral valve repair. There was trivial regurgitation. Mean gradient (D): 4 mm Hg. Valve area by continuity equation (using LVOT flow): 3.97 cm^2. -  Right atrium: Central venous pressure (est): 3 mm Hg. - Tricuspid valve: There was trivial regurgitation. - Pulmonary arteries: PA peak pressure: 28 mm Hg (S). - Pericardium, extracardiac: There was no pericardial effusion.  Consultations:  Cardiology   Discharge Exam: Filed Vitals:   12/27/13 0524 12/27/13 0536 12/27/13 0840 12/27/13 1149  BP: 173/70 162/71    Pulse:  84    Temp:  98.1 F (36.7 C)    TempSrc:  Oral    Resp:  18    Height:      Weight:      SpO2:  98% 98% 98%    General: NAD Cardiovascular: RRR Respiratory: CTA biL  Discharge Instructions  Discharge Instructions   Ambulatory referral to Nutrition and Diabetic Education    Complete by:  As directed             Medication List         albuterol 108 (90 BASE) MCG/ACT inhaler  Commonly known as:  PROVENTIL HFA;VENTOLIN HFA  Inhale 2 puffs into the lungs every 6 (six) hours as needed for wheezing or shortness of breath.     amLODipine 10 MG tablet  Commonly known as:  NORVASC  Take 1 tablet (10 mg total) by mouth daily.     aspirin EC 81 MG tablet  Take 1 tablet (81 mg total) by mouth daily.     atorvastatin 20 MG tablet  Commonly known as:  LIPITOR  Take 1 tablet (20 mg total) by mouth daily.     calcium-vitamin D 250-100 MG-UNIT per tablet  Take 1 tablet by mouth daily.     hydrALAZINE 25 MG tablet  Commonly known as:  APRESOLINE  Take 1 tablet (25 mg total) by mouth 3 (three) times daily.     ibuprofen 200 MG tablet  Commonly known as:  ADVIL,MOTRIN  Take 200 mg by mouth every 6 (six) hours as needed for moderate pain.     indapamide 1.25 MG tablet  Commonly known as:  LOZOL  Take 1 tablet (1.25 mg total) by mouth every morning.     linagliptin 5 MG Tabs tablet  Commonly known as:  TRADJENTA  Take 1 tablet (5 mg total) by mouth daily.     losartan 100 MG tablet  Commonly known as:  COZAAR  Take 1 tablet (100 mg total) by mouth daily.     metFORMIN 850 MG tablet  Commonly known  as:  GLUCOPHAGE  Take 1 tablet (850 mg total) by mouth 2 (two) times daily with a meal.     metoCLOPramide 10 MG tablet  Commonly known as:  REGLAN  Take 1 tablet (10 mg total) by mouth 3 (three) times daily before meals.     multivitamin with minerals Tabs tablet  Take 1 tablet by mouth daily.     pantoprazole 40 MG tablet  Commonly known as:  PROTONIX  Take 1 tablet (40 mg total) by mouth daily.     pantoprazole 40 MG tablet  Commonly known as:  PROTONIX  Take 1 tablet (40 mg total) by mouth daily.     traMADol 50 MG tablet  Commonly known as:  ULTRAM  Take 1 tablet (50 mg total) by mouth 3 (three) times daily as needed. For pain  traZODone 25 mg Tabs tablet  Commonly known as:  DESYREL  Take 0.5 tablets (25 mg total) by mouth at bedtime as needed for sleep.     Vitamin D 2000 UNITS tablet  Take 2,000 Units by mouth daily.           Follow-up Information   Follow up with DEVINE, ALMA, MD. Schedule an appointment as soon as possible for a visit in 2 weeks.   Specialty:  Internal Medicine   Contact information:   [address] [place] [address] [phone]      The results of significant diagnostics from this hospitalization (including imaging, microbiology, ancillary and laboratory) are listed below for reference.    Significant Diagnostic Studies: Dg Chest 2 View  12/24/2013   CLINICAL DATA:  Chest pain  EXAM: CHEST  2 VIEW  COMPARISON:  02/16/2013  FINDINGS: CABG changes. Heart size is within normal limits. Negative for heart failure. Negative for pneumonia. Mild atelectasis in the lung bases. Negative for pleural effusion.  IMPRESSION: Very mild bibasilar atelectasis.   Electronically Signed   By: [name] M.D.   On: 12/24/2013 13:20   Ct Angio Chest Pe W/cm &/or Wo Cm  12/24/2013   CLINICAL DATA:  Shortness of breath  EXAM: CT ANGIOGRAPHY CHEST WITH CONTRAST  TECHNIQUE: Multidetector CT imaging of the chest was performed using the standard  protocol during bolus administration of intravenous contrast. Multiplanar CT image reconstructions and MIPs were obtained to evaluate the vascular anatomy.  CONTRAST:  177mL OMNIPAQUE IOHEXOL 350 MG/ML SOLN  COMPARISON:  Prior radiograph from earlier the same day.  FINDINGS: The thyroid gland is within normal limits.  No pathologically enlarged mediastinal, hilar, or axillary lymph nodes are identified.  Intrathoracic aorta is of normal caliber. Moderate atherosclerotic calcifications present within the aortic arch. Great vessels within normal limits.  Sequelae of prior CABG are seen. Small pericardial effusion present.  Pulmonary arterial tree is well opacified. The main pulmonary artery is dilated 3.9 cm, which can be seen with underlying pulmonary hypertension. No filling defects seen to suggest acute pulmonary embolus. Re-formatted imaging confirms these findings.  A small layering left pleural effusion is present. No interlobular septal thickening to suggest pulmonary edema identified. Bibasilar atelectasis is present. No focal infiltrates identified. No pneumothorax.  Visualized portions of the upper abdomen are unremarkable.  No acute osseous abnormality. No worrisome lytic or blastic osseous lesions.  IMPRESSION: 1. No CT evidence of acute pulmonary embolism. 2. Dilatation of the main pulmonary artery up to 3.9 cm, which can be seen with underlying pulmonary hypertension. 3. Small layering left pleural effusion. 4. Small pericardial effusion. 5. Sequelae of prior CABG. 6.   Electronically Signed   By: [name] M.D.   On: 12/24/2013 13:54    Microbiology: Recent Results (from the past 240 hour(s))  MRSA PCR SCREENING     Status: None   Collection Time    12/24/13 10:58 PM      Result Value Ref Range Status   MRSA by PCR NEGATIVE  NEGATIVE Final   Comment:            The GeneXpert MRSA Assay (FDA     approved for NASAL specimens     only), is one component of a     comprehensive MRSA  colonization     surveillance program. It is not     intended to diagnose MRSA     infection nor to guide or  monitor treatment for     MRSA infections.     Performed at [facility]: Basic Metabolic Panel:  Recent Labs Lab 12/24/13 2302 12/25/13 0520 12/25/13 1022 12/25/13 1825 12/26/13 0622  NA 133* 138 138 136* 140  K 4.4 4.5 3.8 3.9 4.1  CL 95* 102 99 99 105  CO2 17* 22 22 23 24   GLUCOSE 541* 277* 189* 250* 178*  BUN 22 21 17 17 16   CREATININE 1.13* 1.05 0.90 0.94 0.94  CALCIUM 8.7 9.0 9.3 9.4 8.8   Liver Function Tests:  Recent Labs Lab 12/24/13 1601  AST 13  ALT 9  ALKPHOS 63  BILITOT 0.3  PROT 6.5  ALBUMIN 3.2*   CBC:  Recent Labs Lab 12/24/13 1148 12/25/13 0500  WBC 8.1 7.4  HGB 11.9* 10.7*  HCT 36.6 32.2*  MCV 79.7 78.7  PLT 232 214   Cardiac Enzymes:  Recent Labs Lab 12/24/13 1601 12/24/13 2330 12/25/13 0427 12/25/13 1022  TROPONINI <0.30 <0.30 <0.30 <0.30   BNP: BNP (last 3 results)  Recent Labs  12/24/13 1601  PROBNP 460.7*   CBG:  Recent Labs Lab 12/26/13 1158 12/26/13 1703 12/26/13 2158 12/27/13 0735 12/27/13 1143  GLUCAP 198* 201* 204* 157* 169*       Signed:  GHERGHE, COSTIN  [facility] 12/28/2013, 12:20 PM

## 2014-01-10 ENCOUNTER — Ambulatory Visit: Payer: Medicare HMO | Attending: Internal Medicine | Admitting: Internal Medicine

## 2014-01-10 VITALS — BP 104/70 | HR 95 | Temp 98.4°F | Resp 14 | Ht 67.0 in | Wt 237.0 lb

## 2014-01-10 DIAGNOSIS — J4489 Other specified chronic obstructive pulmonary disease: Secondary | ICD-10-CM | POA: Insufficient documentation

## 2014-01-10 DIAGNOSIS — I10 Essential (primary) hypertension: Secondary | ICD-10-CM | POA: Insufficient documentation

## 2014-01-10 DIAGNOSIS — Z87891 Personal history of nicotine dependence: Secondary | ICD-10-CM | POA: Insufficient documentation

## 2014-01-10 DIAGNOSIS — J449 Chronic obstructive pulmonary disease, unspecified: Secondary | ICD-10-CM | POA: Insufficient documentation

## 2014-01-10 DIAGNOSIS — E785 Hyperlipidemia, unspecified: Secondary | ICD-10-CM | POA: Insufficient documentation

## 2014-01-10 DIAGNOSIS — E119 Type 2 diabetes mellitus without complications: Secondary | ICD-10-CM | POA: Insufficient documentation

## 2014-01-10 LAB — GLUCOSE, POCT (MANUAL RESULT ENTRY): POC GLUCOSE: 162 mg/dL — AB (ref 70–99)

## 2014-01-10 MED ORDER — FREESTYLE LANCETS MISC: Status: DC

## 2014-01-10 MED ORDER — GLUCOSE BLOOD VI STRP: ORAL_STRIP | Status: DC

## 2014-01-10 MED ORDER — LINAGLIPTIN 5 MG PO TABS: 5.0000 mg | ORAL_TABLET | Freq: Every day | ORAL | Status: DC

## 2014-01-10 MED ORDER — FREESTYLE LITE DEVI: Status: DC

## 2014-01-10 MED ORDER — TRAMADOL HCL 50 MG PO TABS: 50.0000 mg | ORAL_TABLET | Freq: Three times a day (TID) | ORAL | Status: DC | PRN

## 2014-01-10 MED ORDER — TRAZODONE 25 MG HALF TABLET: 25.0000 mg | ORAL_TABLET | Freq: Every evening | ORAL | Status: DC | PRN

## 2014-01-10 MED ORDER — FREESTYLE LITE DEVI: 1.0000 | Freq: Once | Status: DC

## 2014-01-10 NOTE — Patient Instructions (Signed)
Blood Glucose Monitoring Monitoring your blood glucose (also know as blood sugar) helps you to manage your diabetes. It also helps you and your health care provider monitor your diabetes and determine how well your treatment plan is working. WHY SHOULD YOU MONITOR YOUR BLOOD GLUCOSE?  It can help you understand how food, exercise, and medicine affect your blood glucose.  It allows you to know what your blood glucose is at any given moment. You can quickly tell if you are having low blood glucose (hypoglycemia) or high blood glucose (hyperglycemia).  It can help you and your health care provider know how to adjust your medicines.  It can help you understand how to manage an illness or adjust medicine for exercise. WHEN SHOULD YOU TEST? Your health care provider will help you decide how often you should check your blood glucose. This may depend on the type of diabetes you have, your diabetes control, or the types of medicines you are taking. Be sure to write down all of your blood glucose readings so that this information can be reviewed with your health care provider. See below for examples of testing times that your health care provider may suggest. Type 1 Diabetes  Test 4 times a day if you are in good control, using an insulin pump, or perform multiple daily injections.  If your diabetes is not well-controlled or if you are sick, you may need to monitor more often.  It is a good idea to also monitor:  Before and after exercise.  Between meals and 2 hours after a meal.  Occasionally between 2:00 and 3:00 a.m. Type 2 Diabetes  It can vary with each person, but generally, if you are on insulin, test 4 times a day.  If you take medicines by mouth (orally), test 2 times a day.  If you are on a controlled diet, test once a day.  If your diabetes is not well controlled or if you are sick, you may need to monitor more often. HOW TO MONITOR YOUR BLOOD GLUCOSE Supplies Needed  Blood  glucose meter.  Test strips for your meter. Each meter has its own strips. You must use the strips that go with your own meter.  A pricking needle (lancet).  A device that holds the lancet (lancing device).  A journal or log book to write down your results. Procedure  Wash your hands with soap and water. Alcohol is not preferred.  Prick the side of your finger (not the tip) with the lancet.  Gently milk the finger until a small drop of blood appears.  Follow the instructions that come with your meter for inserting the test strip, applying blood to the strip, and using your blood glucose meter. Other Areas to Get Blood for Testing Some meters allow you to use other areas of your body (other than your finger) to test your blood. These areas are called alternative sites. The most common alternative sites are:  The forearm.  The thigh.  The back area of the lower leg.  The palm of the hand. The blood flow in these areas is slower. Therefore, the blood glucose values you get may be delayed, and the numbers are different from what you would get from your fingers. Do not use alternative sites if you think you are having hypoglycemia. Your reading will not be accurate. Always use a finger if you are having hypoglycemia. Also, if you cannot feel your lows (hypoglycemia unawareness), always use your fingers for your blood glucose  checks. ADDITIONAL TIPS FOR GLUCOSE MONITORING  Do not reuse lancets.  Always carry your supplies with you.  All blood glucose meters have a 24-hour "hotline" number to call if you have questions or need help.  Adjust (calibrate) your blood glucose meter with a control solution after finishing a few boxes of strips. BLOOD GLUCOSE RECORD KEEPING It is a good idea to keep a daily record or log of your blood glucose readings. Most glucose meters, if not all, keep your glucose records stored in the meter. Some meters come with the ability to download your records to  your home computer. Keeping a record of your blood glucose readings is especially helpful if you are wanting to look for patterns. Make notes to go along with the blood glucose readings because you might forget what happened at that exact time. Keeping good records helps you and your health care provider to work together to achieve good diabetes management.  Document Released: 06/16/2003 Document Revised: 06/18/2013 Document Reviewed: 11/05/2012 [facility] Patient Information 2015 [place], [place]. This information is not intended to replace advice given to you by your health care provider. Make sure you discuss any questions you have with your health care provider.

## 2014-01-10 NOTE — Progress Notes (Signed)
HFU Pt is here to manage her diabetes and her pulmonary  HTN.

## 2014-01-10 NOTE — Progress Notes (Signed)
Patient ID: [name], female   DOB: 01/31/1951, 63 y.o.   MRN: [id number]  CC: Followup from recent hospitalization  HPI: 63 year old female with multiple medical comorbidities including hypertension, diabetes, COPD, recent hospitalization for COPD exacerbation who presents to clinic for followup. She is doing well at this time. Still has occasional chest tightness associated with coughing. No shortness of breath on exertion. No fevers or chills.  No Known Allergies Past Medical History  Diagnosis Date  . Coronary artery disease     a. s/p CABG 06/2010: S-D2; S-PDA (at time of MV surgery).  . 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 mitral valve - s/p resection and MV repair 06/2010 Dr. [name].  . Hypertension   . Stroke     a. 11/2009: mitral mass diagnosed at this time, also has distal R MCA stenosis, tx with coumadin. b. Readmitted 05/2010 with TIA symptoms - had not been taking Coumadin. s/p MV surgery 06/2010. Coumadin stopped 2013 after review of chart by Dr. [name] since mass was removed (stroke felt possibly related to this).   [name] Hx of transient ischemic attack (TIA)     a. See stroke section.  . Anemia, iron deficiency   . Diabetes mellitus     type II  . Stenosis of middle cerebral artery     a. Distal R MCA.  [name] Abscess in epidural space of L2-L5 lumbar spine 03/2006  . Arthritis   . Colon polyp     a. Multiple colonic polyps status post colonoscopy in October 2007, consistent with tubular adenoma, tubulovillous adenoma with no high-grade dysplasia or malignancy identified.   . Hepatitis     ?  [name] Sleep apnea   . Cataract   . Depression   . GERD (gastroesophageal reflux disease)   . Hyperlipidemia   . MRSA infection     a. History of recurrent skin infection and soft tissue abscesses, with MRSA positive in the past.  . Hypoxia     a. Has history of acute hypoxic respiratory failure in the setting of bronchitis/PNA or prior admissions.   Current Outpatient  Prescriptions on File Prior to Visit  Medication Sig Dispense Refill  . albuterol (PROVENTIL HFA;VENTOLIN HFA) 108 (90 BASE) MCG/ACT inhaler Inhale 2 puffs into the lungs every 6 (six) hours as needed for wheezing or shortness of breath.  1 Inhaler  2  . amLODipine (NORVASC) 10 MG tablet Take 1 tablet (10 mg total) by mouth daily.  30 tablet  1  . aspirin EC 81 MG tablet Take 1 tablet (81 mg total) by mouth daily.  30 tablet  1  . atorvastatin (LIPITOR) 20 MG tablet Take 1 tablet (20 mg total) by mouth daily.  30 tablet  1  . calcium-vitamin D 250-100 MG-UNIT per tablet Take 1 tablet by mouth daily.  30 tablet  1  . Cholecalciferol (VITAMIN D) 2000 UNITS tablet Take 2,000 Units by mouth daily.      . hydrALAZINE (APRESOLINE) 25 MG tablet Take 1 tablet (25 mg total) by mouth 3 (three) times daily.  90 tablet  1  . indapamide (LOZOL) 1.25 MG tablet Take 1 tablet (1.25 mg total) by mouth every morning.  30 tablet  1  . losartan (COZAAR) 100 MG tablet Take 1 tablet (100 mg total) by mouth daily.  30 tablet  1  . metFORMIN (GLUCOPHAGE) 850 MG tablet Take 1 tablet (850 mg total) by mouth 2 (two) times daily with a meal.  60 tablet  1  . metoCLOPramide (REGLAN) 10 MG tablet Take 1 tablet (10 mg total) by mouth 3 (three) times daily before meals.  90 tablet  1  . pantoprazole (PROTONIX) 40 MG tablet Take 1 tablet (40 mg total) by mouth daily.  30 tablet  1  . ibuprofen (ADVIL,MOTRIN) 200 MG tablet Take 200 mg by mouth every 6 (six) hours as needed for moderate pain.      . Multiple Vitamin (MULTIVITAMIN WITH MINERALS) TABS tablet Take 1 tablet by mouth daily.      . pantoprazole (PROTONIX) 40 MG tablet Take 1 tablet (40 mg total) by mouth daily.  30 tablet  5   No current facility-administered medications on file prior to visit.   Family History  Problem Relation Age of Onset  . Colon cancer Mother   . Prostate cancer Father   . Cirrhosis Sister     died of GI bleed associated with cirrhosis of the  liver  . Stroke Brother   . Diabetes Brother     x3  . Kidney disease Brother   . Irritable bowel syndrome Mother    History   Social History  . Marital Status: Married    Spouse Name: N/A    Number of Children: 3  . Years of Education: N/A   Occupational History  .     Social History Main Topics  . Smoking status: Former Smoker    Quit date: 10/14/2010  . Smokeless tobacco: Never Used  . Alcohol Use: No  . Drug Use: No  . Sexual Activity: No   Other Topics Concern  . Not on file   Social History Narrative   Lives with husband, stay at home, uses cane occasionally, still active/ambulatory.     Review of Systems  Constitutional: Negative for fever, chills, diaphoresis, activity change, appetite change and fatigue.  HENT: Negative for ear pain, nosebleeds, congestion, facial swelling, rhinorrhea, neck pain, neck stiffness and ear discharge.   Eyes: Negative for pain, discharge, redness, itching and visual disturbance.  Respiratory: Negative for cough, choking, chest tightness, shortness of breath, wheezing and stridor.   Cardiovascular: Negative for chest pain, palpitations and leg swelling.  Gastrointestinal: Negative for abdominal distention.  Genitourinary: Negative for dysuria, urgency, frequency, hematuria, flank pain, decreased urine volume, difficulty urinating and dyspareunia.  Musculoskeletal: Negative for back pain, joint swelling, arthralgias and gait problem.  Neurological: Negative for dizziness, tremors, seizures, syncope, facial asymmetry, speech difficulty, weakness, light-headedness, numbness and headaches.  Hematological: Negative for adenopathy. Does not bruise/bleed easily.  Psychiatric/Behavioral: Negative for hallucinations, behavioral problems, confusion, dysphoric mood, decreased concentration and agitation.    Objective:   Filed Vitals:   01/10/14 1017  BP: 104/70  Pulse: 95  Temp: 98.4 F (36.9 C)  Resp: 14    Physical Exam   Constitutional: Appears well-developed and well-nourished. No distress.  HENT: Normocephalic. External right and left ear normal. Oropharynx is clear and moist.  Eyes: Conjunctivae and EOM are normal. PERRLA, no scleral icterus.  Neck: Normal ROM. Neck supple. No JVD. No tracheal deviation. No thyromegaly.  CVS: RRR, S1/S2 +, no murmurs, no gallops, no carotid bruit.  Pulmonary: Effort and breath sounds normal, no stridor, rhonchi, wheezes, rales.  Abdominal: Soft. BS +,  no distension, tenderness, rebound or guarding.  Musculoskeletal: Normal range of motion. No edema and no tenderness.  Lymphadenopathy: No lymphadenopathy noted, cervical, inguinal. Neuro: Alert. Normal reflexes, muscle tone coordination. No cranial nerve deficit. Skin: Skin is warm and  dry. No rash noted. Not diaphoretic. No erythema. No pallor.  Psychiatric: Normal mood and affect. Behavior, judgment, thought content normal.   Lab Results  Component Value Date   WBC 7.4 12/25/2013   HGB 10.7* 12/25/2013   HCT 32.2* 12/25/2013   MCV 78.7 12/25/2013   PLT 214 12/25/2013   Lab Results  Component Value Date   CREATININE 0.94 12/26/2013   BUN 16 12/26/2013   NA 140 12/26/2013   K 4.1 12/26/2013   CL 105 12/26/2013   CO2 24 12/26/2013    Lab Results  Component Value Date   HGBA1C 9.5* 12/24/2013   Lipid Panel     Component Value Date/Time   CHOL 217* 12/25/2013 0500   TRIG 53 12/25/2013 0500   HDL 118 12/25/2013 0500   CHOLHDL 1.8 12/25/2013 0500   VLDL 11 12/25/2013 0500   LDLCALC 88 12/25/2013 0500       Assessment and plan:   Patient Active Problem List   Diagnosis Date Noted  . COPD (chronic obstructive pulmonary disease) 12/24/2013    Priority: High stable  - continue albuterol per prior home dosing and frequency   . HYPERLIPIDEMIA 06/29/2010    Priority: Medium  - continue statin therapy   . DM w/o Complication Type II [id number]    Priority: Medium - continue current meds - A1c due in 02/2014  . Unspecified essential  hypertension 09/29/2006    Priority: Medium - We have discussed target BP range - I have advised pt to check BP regularly and to call us back if the numbers are higher than 140/90 - discussed the importance of compliance with medical therapy and diet  - continue current meds

## 2014-01-10 NOTE — Addendum Note (Signed)
Addended by: Robbie Lis on: 01/10/2014 10:58 AM   Modules accepted: Orders

## 2014-02-07 ENCOUNTER — Other Ambulatory Visit: Payer: Self-pay | Admitting: *Deleted

## 2014-02-07 DIAGNOSIS — G4700 Insomnia, unspecified: Secondary | ICD-10-CM

## 2014-02-07 DIAGNOSIS — E785 Hyperlipidemia, unspecified: Secondary | ICD-10-CM

## 2014-02-07 DIAGNOSIS — I10 Essential (primary) hypertension: Secondary | ICD-10-CM

## 2014-02-07 DIAGNOSIS — E119 Type 2 diabetes mellitus without complications: Secondary | ICD-10-CM

## 2014-02-07 DIAGNOSIS — J441 Chronic obstructive pulmonary disease with (acute) exacerbation: Secondary | ICD-10-CM

## 2014-02-07 MED ORDER — LOSARTAN POTASSIUM 100 MG PO TABS: 100.0000 mg | ORAL_TABLET | Freq: Every day | ORAL | Status: DC

## 2014-02-07 MED ORDER — ATORVASTATIN CALCIUM 20 MG PO TABS: 20.0000 mg | ORAL_TABLET | Freq: Every day | ORAL | Status: DC

## 2014-02-07 MED ORDER — TRAZODONE 25 MG HALF TABLET: 25.0000 mg | ORAL_TABLET | Freq: Every evening | ORAL | Status: DC | PRN

## 2014-02-07 MED ORDER — INDAPAMIDE 1.25 MG PO TABS: 1.2500 mg | ORAL_TABLET | Freq: Every morning | ORAL | Status: DC

## 2014-02-07 MED ORDER — TRAMADOL HCL 50 MG PO TABS: 50.0000 mg | ORAL_TABLET | Freq: Three times a day (TID) | ORAL | Status: DC | PRN

## 2014-02-07 MED ORDER — AMLODIPINE BESYLATE 10 MG PO TABS: 10.0000 mg | ORAL_TABLET | Freq: Every day | ORAL | Status: DC

## 2014-02-07 MED ORDER — HYDRALAZINE HCL 25 MG PO TABS: 25.0000 mg | ORAL_TABLET | Freq: Three times a day (TID) | ORAL | Status: DC

## 2014-02-07 MED ORDER — ALBUTEROL SULFATE HFA 108 (90 BASE) MCG/ACT IN AERS: 2.0000 | INHALATION_SPRAY | Freq: Four times a day (QID) | RESPIRATORY_TRACT | Status: DC | PRN

## 2014-02-07 MED ORDER — LINAGLIPTIN 5 MG PO TABS: 5.0000 mg | ORAL_TABLET | Freq: Every day | ORAL | Status: DC

## 2014-02-07 MED ORDER — METOCLOPRAMIDE HCL 10 MG PO TABS: 10.0000 mg | ORAL_TABLET | Freq: Three times a day (TID) | ORAL | Status: DC

## 2014-02-07 MED ORDER — METFORMIN HCL 850 MG PO TABS: 850.0000 mg | ORAL_TABLET | Freq: Two times a day (BID) | ORAL | Status: DC

## 2014-02-07 NOTE — Telephone Encounter (Signed)
[name], [name] presents today for patient's refills to be sent to [facility] as they will be less expensive.

## 2014-02-14 ENCOUNTER — Telehealth: Payer: Self-pay | Admitting: Family Medicine

## 2014-02-14 NOTE — Telephone Encounter (Signed)
Nurse from [facility] calling to get Protonix 40mg  and Tramadol have not been sent to the [facility] Fax: [phone]. Nurse states that she spoke to [place] about medications. Please f/u with nurse.

## 2014-02-14 NOTE — Telephone Encounter (Signed)
Scripts for Tramadol and Trazadone faxed to [facility] @ [phone] per Dr.Jegede

## 2014-02-17 ENCOUNTER — Telehealth: Payer: Self-pay | Admitting: Family Medicine

## 2014-02-17 NOTE — Telephone Encounter (Signed)
[employer] nurse is calling to request a script for Protonix 40mg . Please f/u with nurse.

## 2014-02-17 NOTE — Telephone Encounter (Signed)
Scripts were faxed last Friday to [facility]. Left message on VM.

## 2014-02-19 ENCOUNTER — Encounter: Payer: Medicare HMO | Attending: Internal Medicine | Admitting: *Deleted

## 2014-02-19 ENCOUNTER — Encounter: Payer: Self-pay | Admitting: *Deleted

## 2014-02-19 VITALS — Ht 67.75 in | Wt 233.3 lb

## 2014-02-19 DIAGNOSIS — Z713 Dietary counseling and surveillance: Secondary | ICD-10-CM | POA: Insufficient documentation

## 2014-02-19 DIAGNOSIS — E119 Type 2 diabetes mellitus without complications: Secondary | ICD-10-CM | POA: Diagnosis not present

## 2014-02-19 NOTE — Progress Notes (Signed)
Appt start time: 1400 end time:  1530.  Assessment:  Patient was seen on  02/19/2014 for individual diabetes education. Patient lives with her husband, he shops and they share the preparation of the meals. Recently in hospital for COPD, Does not have a meter, she has a Rx but has not filled it yet so not SMBG yet. States no previous diabetes education. Not able to exercise right now due to breathing difficulties. States feelings of depression with multiple medical problems  Patient Education Plan per assessed needs and concerns is to attend individual session for Diabetes Self Management Education.  Current HbA1c: 9.5%  Preferred Learning Style:   No preference indicated   Learning Readiness:   Ready  Change in progress  MEDICATIONS: see list, diabetes medications are Metformin and Tradjenta  DIETARY INTAKE:  24-hr recall:  B ( AM): if she wakes up in time, will have a bowl of cereal or brunch of left overs  Snk ( AM): no  L ( PM): nutrition bar or chips occasionally Snk ( PM): no D ( PM): lean meat, occasionally starch, always vegetables Snk ( PM): not usually Beverages: water or fruit juice  Usual physical activity: limited due to COPD  Estimated energy needs: 1400 calories 158 g carbohydrates 105 g protein 39 g fat  Progress Towards Goal(s):  In progress.   Nutritional Diagnosis:  NB-1.1 Food and nutrition-related knowledge deficit As related to Diabetes.  As evidenced by A1c of 9.5%.    Intervention:  Nutrition counseling provided.  Discussed diabetes disease process and treatment options.  Discussed physiology of diabetes and role of obesity on insulin resistance.  Encouraged moderate weight reduction to improve glucose levels.    Discussed effects of physical activity on glucose levels and long-term glucose control.  Suggested Arm Chair Exercises as a safer form of physical activity/week.  Reviewed patient medications.  Discussed role of medication on blood  glucose and possible side effects  Discussed blood glucose monitoring and interpretation.  Discussed recommended target ranges and individual ranges.    Described short-term complications: hyper- and hypo-glycemia.  Discussed causes,symptoms, and treatment options.  Discussed prevention, detection, and treatment of long-term complications.  Discussed the role of prolonged elevated glucose levels on body systems.  Discussed role of stress on blood glucose levels and discussed strategies to manage psychosocial issues.  Informed her of [facility] [employer] that she can call for support for her depression if needed.  Next visit:   Provid education on macronutrients on glucose levels.  Provided education on carb counting, importance of regularly scheduled meals/snacks, and meal planning  Discuss recommendations for long-term diabetes self-care.  Provide checklist for medical, dental, and emotional self-care.  Plan:  Consider  increasing your activity level by dancing or Arm Chair Exercises at home  for 5-10 minutes several times daily as tolerated Consider checking BG at alternate times per day    Teaching Method Utilized: Visual, Auditory and Hands on  Handouts given during visit include: Living Well with Diabetes Carb Counting and Food Label handouts Meal Plan Card [facility] [employer] Card  Barriers to learning/adherence to lifestyle change: depression and multiple medical issues  Diabetes self-care support plan:   [facility] support group available  Demonstrated degree of understanding via:  Teach Back   Monitoring/Evaluation:  Dietary intake, exercise, SMBG, and body weight in 1 month(s). Patient wants to verify her insurance coverage for these visits.

## 2014-02-19 NOTE — Patient Instructions (Signed)
Plan:  Consider  increasing your activity level by dancing or Arm Chair Exercises at home  for 5-10 minutes several times daily as tolerated Consider checking BG at alternate times per day

## 2014-03-06 ENCOUNTER — Emergency Department (HOSPITAL_COMMUNITY): Payer: Medicare HMO

## 2014-03-06 ENCOUNTER — Emergency Department (HOSPITAL_COMMUNITY)
Admission: EM | Admit: 2014-03-06 | Discharge: 2014-03-06 | Disposition: A | Discharge status: Home / Self Care | Payer: Medicare HMO | Attending: Emergency Medicine | Admitting: Emergency Medicine

## 2014-03-06 ENCOUNTER — Encounter (HOSPITAL_COMMUNITY): Payer: Self-pay | Admitting: Emergency Medicine

## 2014-03-06 DIAGNOSIS — Z8669 Personal history of other diseases of the nervous system and sense organs: Secondary | ICD-10-CM | POA: Diagnosis not present

## 2014-03-06 DIAGNOSIS — Z23 Encounter for immunization: Secondary | ICD-10-CM | POA: Insufficient documentation

## 2014-03-06 DIAGNOSIS — K219 Gastro-esophageal reflux disease without esophagitis: Secondary | ICD-10-CM | POA: Diagnosis not present

## 2014-03-06 DIAGNOSIS — E119 Type 2 diabetes mellitus without complications: Secondary | ICD-10-CM | POA: Diagnosis not present

## 2014-03-06 DIAGNOSIS — Z7982 Long term (current) use of aspirin: Secondary | ICD-10-CM | POA: Insufficient documentation

## 2014-03-06 DIAGNOSIS — Z8673 Personal history of transient ischemic attack (TIA), and cerebral infarction without residual deficits: Secondary | ICD-10-CM | POA: Diagnosis not present

## 2014-03-06 DIAGNOSIS — Z8614 Personal history of Methicillin resistant Staphylococcus aureus infection: Secondary | ICD-10-CM | POA: Diagnosis not present

## 2014-03-06 DIAGNOSIS — Y9289 Other specified places as the place of occurrence of the external cause: Secondary | ICD-10-CM | POA: Insufficient documentation

## 2014-03-06 DIAGNOSIS — W268XXA Contact with other sharp object(s), not elsewhere classified, initial encounter: Secondary | ICD-10-CM | POA: Diagnosis not present

## 2014-03-06 DIAGNOSIS — M129 Arthropathy, unspecified: Secondary | ICD-10-CM | POA: Diagnosis not present

## 2014-03-06 DIAGNOSIS — E785 Hyperlipidemia, unspecified: Secondary | ICD-10-CM | POA: Diagnosis not present

## 2014-03-06 DIAGNOSIS — Z79899 Other long term (current) drug therapy: Secondary | ICD-10-CM | POA: Insufficient documentation

## 2014-03-06 DIAGNOSIS — Z87891 Personal history of nicotine dependence: Secondary | ICD-10-CM | POA: Diagnosis not present

## 2014-03-06 DIAGNOSIS — Z951 Presence of aortocoronary bypass graft: Secondary | ICD-10-CM | POA: Insufficient documentation

## 2014-03-06 DIAGNOSIS — S91332A Puncture wound without foreign body, left foot, initial encounter: Secondary | ICD-10-CM

## 2014-03-06 DIAGNOSIS — Z860100 Personal history of colon polyps, unspecified: Secondary | ICD-10-CM | POA: Insufficient documentation

## 2014-03-06 DIAGNOSIS — I10 Essential (primary) hypertension: Secondary | ICD-10-CM | POA: Diagnosis not present

## 2014-03-06 DIAGNOSIS — I2510 Atherosclerotic heart disease of native coronary artery without angina pectoris: Secondary | ICD-10-CM | POA: Insufficient documentation

## 2014-03-06 DIAGNOSIS — Y9389 Activity, other specified: Secondary | ICD-10-CM | POA: Insufficient documentation

## 2014-03-06 DIAGNOSIS — S91309A Unspecified open wound, unspecified foot, initial encounter: Secondary | ICD-10-CM | POA: Diagnosis present

## 2014-03-06 DIAGNOSIS — Z862 Personal history of diseases of the blood and blood-forming organs and certain disorders involving the immune mechanism: Secondary | ICD-10-CM | POA: Insufficient documentation

## 2014-03-06 MED ORDER — TETANUS-DIPHTH-ACELL PERTUSSIS 5-2.5-18.5 LF-MCG/0.5 IM SUSP
0.5000 mL | Freq: Once | INTRAMUSCULAR | Status: AC
  Administered 2014-03-06: 0.5 mL via INTRAMUSCULAR
  Filled 2014-03-06: qty 0.5

## 2014-03-06 MED ORDER — CIPROFLOXACIN HCL 500 MG PO TABS: 500.0000 mg | ORAL_TABLET | Freq: Two times a day (BID) | ORAL | Status: DC

## 2014-03-06 NOTE — ED Notes (Signed)
Gave pt coffee and warm blanket per request

## 2014-03-06 NOTE — ED Notes (Signed)
Ortho paged for crutches

## 2014-03-06 NOTE — ED Notes (Signed)
Pt with small laceration to left foot from glass last night; bleeding controlled

## 2014-03-06 NOTE — ED Notes (Signed)
Pt states she stepped on a lightbulb last night and was worried about having glass stuck inside; she is also worried about having a wound on her foot due to diabetes.  Pt states she feels pain 5/10.

## 2014-03-06 NOTE — Discharge Instructions (Signed)
Puncture Wound °A puncture wound is an injury that extends through all layers of the skin and into the tissue beneath the skin (subcutaneous tissue). Puncture wounds become infected easily because germs often enter the body and go beneath the skin during the injury. Having a deep wound with a small entrance point makes it difficult for your caregiver to adequately clean the wound. This is especially true if you have stepped on a nail and it has passed through a dirty shoe or other situations where the wound is obviously contaminated. °CAUSES  °Many puncture wounds involve glass, nails, splinters, fish hooks, or other objects that enter the skin (foreign bodies). A puncture wound may also be caused by a human bite or animal bite. °DIAGNOSIS  °A puncture wound is usually diagnosed by your history and a physical exam. You may need to have an X-ray or an ultrasound to check for any foreign bodies still in the wound. °TREATMENT  °· Your caregiver will clean the wound as thoroughly as possible. Depending on the location of the wound, a bandage (dressing) may be applied. °· Your caregiver might prescribe antibiotic medicines. °· You may need a follow-up visit to check on your wound. Follow all instructions as directed by your caregiver. °HOME CARE INSTRUCTIONS  °· Change your dressing once per day, or as directed by your caregiver. If the dressing sticks, it may be removed by soaking the area in water. °· If your caregiver has given you follow-up instructions, it is very important that you return for a follow-up appointment. Not following up as directed could result in a chronic or permanent injury, pain, and disability. °· Only take over-the-counter or prescription medicines for pain, discomfort, or fever as directed by your caregiver. °· If you are given antibiotics, take them as directed. Finish them even if you start to feel better. °You may need a tetanus shot if: °· You cannot remember when you had your last tetanus  shot. °· You have never had a tetanus shot. °If you got a tetanus shot, your arm may swell, get red, and feel warm to the touch. This is common and not a problem. If you need a tetanus shot and you choose not to have one, there is a rare chance of getting tetanus. Sickness from tetanus can be serious. °You may need a rabies shot if an animal bite caused your puncture wound. °SEEK MEDICAL CARE IF:  °· You have redness, swelling, or increasing pain in the wound. °· You have red streaks going away from the wound. °· You notice a bad smell coming from the wound or dressing. °· You have yellowish-white fluid (pus) coming from the wound. °· You are treated with an antibiotic for infection, but the infection is not getting better. °· You notice something in the wound, such as rubber from your shoe, cloth, or another object. °· You have a fever. °· You have severe pain. °· You have difficulty breathing. °· You feel dizzy or faint. °· You cannot stop vomiting. °· You lose feeling, develop numbness, or cannot move a limb below the wound. °· Your symptoms worsen. °MAKE SURE YOU: °· Understand these instructions. °· Will watch your condition. °· Will get help right away if you are not doing well or get worse. °Document Released: 03/23/2005 Document Revised: 09/05/2011 Document Reviewed: 11/30/2010 °[employer]® Patient Information ©2015 [employer]. This information is not intended to replace advice given to you by your health care provider. Make sure you discuss any questions you   have with your health care provider. ° °

## 2014-03-06 NOTE — Progress Notes (Signed)
Orthopedic Tech Progress Note Patient Details:  [name] [birth year] [id number]  Ortho Devices Type of Ortho Device: Crutches Ortho Device/Splint Interventions: Ordered;Adjustment   Braulio Bosch 03/06/2014, 3:37 PM

## 2014-03-06 NOTE — ED Provider Notes (Signed)
CSN: [id number]     Arrival date & time 03/06/14  1246 History  This chart was scribed for non-physician practitioner Margarita Mail working with No att. providers found by Donato Schultz, ED Scribe. This patient was seen in room TR11C/TR11C and the patient's care was started at 2:24 PM.     Chief Complaint  Patient presents with  . Extremity Laceration    The history is provided by the patient. No language interpreter was used.   HPI Comments: [name] is a 63 y.o. female with a history of DM who presents to the Emergency Department complaining of a painful, sore, small laceration on the sole of her left foot that she incurred last night after she stepped on a light bulb.  She was able to get the bleeding controlled last night but states that the wound started bleeding again this morning.  The bleeding is now controlled.  She is not sure when her last TDAP was.    Past Medical History  Diagnosis Date  . Coronary artery disease     a. s/p CABG 06/2010: S-D2; S-PDA (at time of MV surgery).  . 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 mitral valve - s/p resection and MV repair 06/2010 Dr. [name].  . Hypertension   . Stroke     a. 11/2009: mitral mass diagnosed at this time, also has distal R MCA stenosis, tx with coumadin. b. Readmitted 05/2010 with TIA symptoms - had not been taking Coumadin. s/p MV surgery 06/2010. Coumadin stopped 2013 after review of chart by Dr. [name] since mass was removed (stroke felt possibly related to this).   [name] Hx of transient ischemic attack (TIA)     a. See stroke section.  . Anemia, iron deficiency   . Diabetes mellitus     type II  . Stenosis of middle cerebral artery     a. Distal R MCA.  [name] Abscess in epidural space of L2-L5 lumbar spine 03/2006  . Arthritis   . Colon polyp     a. Multiple colonic polyps status post colonoscopy in October 2007, consistent with tubular adenoma, tubulovillous adenoma with no high-grade dysplasia or malignancy  identified.   . Hepatitis     ?  [name] Sleep apnea   . Cataract   . Depression   . GERD (gastroesophageal reflux disease)   . Hyperlipidemia   . MRSA infection     a. History of recurrent skin infection and soft tissue abscesses, with MRSA positive in the past.  . Hypoxia     a. Has history of acute hypoxic respiratory failure in the setting of bronchitis/PNA or prior admissions.   Past Surgical History  Procedure Laterality Date  . Vesicovaginal fistula closure w/ tah    . History of ankle fractures requiring surgery    . Mv repair and resection of mass  1/12  . Coronary artery bypass graft  1/12  . Total abdominal hysterectomy  1978  . Tonsillectomy  1971   Family History  Problem Relation Age of Onset  . Colon cancer Mother   . Prostate cancer Father   . Cirrhosis Sister     died of GI bleed associated with cirrhosis of the liver  . Stroke Brother   . Diabetes Brother     x3  . Kidney disease Brother   . Irritable bowel syndrome Mother    History  Substance Use Topics  . Smoking status: Former Smoker    Quit date: 10/14/2010  .  Smokeless tobacco: Never Used  . Alcohol Use: No   OB History   Grav Para Term Preterm Abortions TAB SAB Ect Mult Living                 Review of Systems  Skin: Positive for wound.  All other systems reviewed and are negative.     Allergies  Review of patient's allergies indicates no known allergies.  Home Medications   Prior to Admission medications   Medication Sig Start Date End Date Taking? Authorizing Provider  albuterol (PROVENTIL HFA;VENTOLIN HFA) 108 (90 BASE) MCG/ACT inhaler Inhale 2 puffs into the lungs every 6 (six) hours as needed for wheezing or shortness of breath. 02/07/14  Yes Tresa Garter, MD  amLODipine (NORVASC) 10 MG tablet Take 1 tablet (10 mg total) by mouth daily. 02/07/14  Yes Tresa Garter, MD  aspirin EC 81 MG tablet Take 1 tablet (81 mg total) by mouth daily. 12/27/13  Yes Costin Karlyne Greenspan, MD   atorvastatin (LIPITOR) 20 MG tablet Take 1 tablet (20 mg total) by mouth daily. 02/07/14  Yes Tresa Garter, MD  Blood Glucose Monitoring Suppl (FREESTYLE LITE) DEVI Use a test once a day Dx code: 250.00 01/10/14  Yes Robbie Lis, MD  calcium-vitamin D 250-100 MG-UNIT per tablet Take 1 tablet by mouth daily. 12/27/13  Yes Costin Karlyne Greenspan, MD  Cholecalciferol (VITAMIN D) 2000 UNITS tablet Take 2,000 Units by mouth daily.   Yes Historical Provider, MD  glucose blood (FREESTYLE TEST STRIPS) test strip Use a test once a day Dx code: 250.00 01/10/14  Yes Robbie Lis, MD  hydrALAZINE (APRESOLINE) 25 MG tablet Take 1 tablet (25 mg total) by mouth 3 (three) times daily. 02/07/14  Yes Tresa Garter, MD  ibuprofen (ADVIL,MOTRIN) 200 MG tablet Take 200 mg by mouth every 6 (six) hours as needed for moderate pain.   Yes Historical Provider, MD  indapamide (LOZOL) 1.25 MG tablet Take 1 tablet (1.25 mg total) by mouth every morning. 02/07/14  Yes Tresa Garter, MD  Lancets (FREESTYLE) lancets Use a test once a day Dx code: 250.00 01/10/14  Yes Robbie Lis, MD  linagliptin (TRADJENTA) 5 MG TABS tablet Take 1 tablet (5 mg total) by mouth daily. 02/07/14  Yes Tresa Garter, MD  losartan (COZAAR) 100 MG tablet Take 1 tablet (100 mg total) by mouth daily. 02/07/14  Yes Tresa Garter, MD  metFORMIN (GLUCOPHAGE) 850 MG tablet Take 1 tablet (850 mg total) by mouth 2 (two) times daily with a meal. 02/07/14  Yes Tresa Garter, MD  metoCLOPramide (REGLAN) 10 MG tablet Take 1 tablet (10 mg total) by mouth 3 (three) times daily before meals. 02/07/14  Yes Tresa Garter, MD  Multiple Vitamin (MULTIVITAMIN WITH MINERALS) TABS tablet Take 1 tablet by mouth daily.   Yes Historical Provider, MD  pantoprazole (PROTONIX) 40 MG tablet Take 1 tablet (40 mg total) by mouth daily. 12/27/13  Yes Costin Karlyne Greenspan, MD  traMADol (ULTRAM) 50 MG tablet Take 1 tablet (50 mg total) by mouth 3 (three) times  daily as needed. For pain 02/07/14  Yes Tresa Garter, MD  traZODone (DESYREL) 25 mg TABS tablet Take 0.5 tablets (25 mg total) by mouth at bedtime as needed for sleep. 02/07/14  Yes Tresa Garter, MD  ciprofloxacin (CIPRO) 500 MG tablet Take 1 tablet (500 mg total) by mouth 2 (two) times daily. 03/06/14   Margarita Mail, PA-C  pantoprazole (  PROTONIX) 40 MG tablet Take 1 tablet (40 mg total) by mouth daily. 10/10/12 10/10/13  Jerene Bears, MD   BP 131/73  Pulse 78  Temp(Src) 98.4 F (36.9 C) (Oral)  Resp 12  SpO2 99%  Physical Exam  Nursing note and vitals reviewed. Constitutional: She is oriented to person, place, and time. She appears well-developed and well-nourished.  HENT:  Head: Normocephalic and atraumatic.  Eyes: EOM are normal.  Neck: Normal range of motion.  Cardiovascular: Normal rate.   Pulmonary/Chest: Effort normal.  Musculoskeletal: Normal range of motion.  Neurological: She is alert and oriented to person, place, and time.  Skin: Skin is warm and dry.  Psychiatric: She has a normal mood and affect. Her behavior is normal.    ED Course  Procedures (including critical care time)  DIAGNOSTIC STUDIES: Oxygen Saturation is 100% on room air, normal by my interpretation.    COORDINATION OF CARE: 2:25 PM- Discussed obtaining an x-ray of the patient's left foot and the patient agreed to the treatment plan.   Labs Review Labs Reviewed - No data to display  Imaging Review No results found.   EKG Interpretation None      MDM   Final diagnoses:  Puncture wound to foot, left, initial encounter    Negative xray Wound cleansed  PATIENT INFORMED OF POSSIBILITY OF RETAINED FOREIGN MATERIAL EVEN AFTER CLEANSING AND DBRIDEMENT.   D/c with pain meds and abx ad pt has dm/ tinea pedis I personally performed the services described in this documentation, which was scribed in my presence. The recorded information has been reviewed and is accurate.       Margarita Mail, PA-C 03/10/14 2212

## 2014-03-11 NOTE — ED Provider Notes (Signed)
Medical screening examination/treatment/procedure(s) were performed by non-physician practitioner and as supervising physician I was immediately available for consultation/collaboration.   EKG Interpretation None        Houston Siren III, MD 03/11/14 [id number]

## 2014-04-21 ENCOUNTER — Telehealth: Payer: Self-pay | Admitting: Emergency Medicine

## 2014-04-21 NOTE — Telephone Encounter (Signed)
Pt instructed to bring medication list for refills at next week OV No medication need refilled at this time

## 2014-04-28 ENCOUNTER — Ambulatory Visit: Payer: Commercial Managed Care - HMO | Admitting: Internal Medicine

## 2014-05-30 ENCOUNTER — Other Ambulatory Visit: Payer: Self-pay | Admitting: Internal Medicine

## 2014-06-06 ENCOUNTER — Other Ambulatory Visit: Payer: Self-pay | Admitting: Internal Medicine

## 2014-06-09 ENCOUNTER — Other Ambulatory Visit: Payer: Self-pay | Admitting: Internal Medicine

## 2014-06-13 ENCOUNTER — Other Ambulatory Visit: Payer: Self-pay | Admitting: *Deleted

## 2014-06-13 ENCOUNTER — Telehealth: Payer: Self-pay | Admitting: Internal Medicine

## 2014-06-13 NOTE — Telephone Encounter (Signed)
[facility] mail order pharmacy calling for refill on pt's medications. Please f/u with pt/pharmacy.

## 2014-06-13 NOTE — Telephone Encounter (Signed)
[name]

## 2014-06-18 ENCOUNTER — Telehealth: Payer: Self-pay | Admitting: Internal Medicine

## 2014-06-18 DIAGNOSIS — E119 Type 2 diabetes mellitus without complications: Secondary | ICD-10-CM

## 2014-06-18 DIAGNOSIS — I10 Essential (primary) hypertension: Secondary | ICD-10-CM

## 2014-06-18 DIAGNOSIS — E785 Hyperlipidemia, unspecified: Secondary | ICD-10-CM

## 2014-06-18 MED ORDER — METFORMIN HCL 850 MG PO TABS: 850.0000 mg | ORAL_TABLET | Freq: Two times a day (BID) | ORAL | Status: DC

## 2014-06-18 MED ORDER — AMLODIPINE BESYLATE 10 MG PO TABS: 10.0000 mg | ORAL_TABLET | Freq: Every day | ORAL | Status: DC

## 2014-06-18 MED ORDER — LOSARTAN POTASSIUM 100 MG PO TABS
100.0000 mg | ORAL_TABLET | Freq: Every day | ORAL | Status: DC
Start: 2014-06-18 — End: 2014-06-26

## 2014-06-18 MED ORDER — ATORVASTATIN CALCIUM 20 MG PO TABS: 20.0000 mg | ORAL_TABLET | Freq: Every day | ORAL | Status: DC

## 2014-06-18 NOTE — Telephone Encounter (Signed)
Rx Norvasc, Losartan, Metformin and Lipitor was e-script to [facility]  Pt Notified Rx was refill , needs to maintain visit for furfures refills

## 2014-06-18 NOTE — Telephone Encounter (Signed)
Patient calling to speak to clinic staff in regards to a medication refill request. Patient has not been seen in clinic since July. Informed patient that a OV would be needed in order for the providers to refill any meds. Scheduled patient first available appt, 06/26/14. Patient states she was phoned by a Chiropodist and the representative informed patient that she (representative) had spoken to a nurse in regards to med refill and was told by nurse that clinic would be faxing over a 30 day supply of meds, until patient is able to be seen in clinic. Patient would like to know if and when this fax will be made, as she is out of meds. Please assist.

## 2014-06-26 ENCOUNTER — Ambulatory Visit (HOSPITAL_BASED_OUTPATIENT_CLINIC_OR_DEPARTMENT_OTHER): Payer: Medicaid Other | Admitting: *Deleted

## 2014-06-26 ENCOUNTER — Ambulatory Visit: Payer: Commercial Managed Care - HMO | Attending: Internal Medicine | Admitting: Internal Medicine

## 2014-06-26 ENCOUNTER — Encounter: Payer: Self-pay | Admitting: Internal Medicine

## 2014-06-26 VITALS — BP 121/75 | HR 69 | Temp 98.4°F | Resp 16 | Ht 67.0 in | Wt 247.0 lb

## 2014-06-26 DIAGNOSIS — K219 Gastro-esophageal reflux disease without esophagitis: Secondary | ICD-10-CM | POA: Insufficient documentation

## 2014-06-26 DIAGNOSIS — Z79899 Other long term (current) drug therapy: Secondary | ICD-10-CM | POA: Insufficient documentation

## 2014-06-26 DIAGNOSIS — M1990 Unspecified osteoarthritis, unspecified site: Secondary | ICD-10-CM | POA: Insufficient documentation

## 2014-06-26 DIAGNOSIS — G4700 Insomnia, unspecified: Secondary | ICD-10-CM | POA: Insufficient documentation

## 2014-06-26 DIAGNOSIS — M545 Low back pain: Secondary | ICD-10-CM | POA: Insufficient documentation

## 2014-06-26 DIAGNOSIS — E785 Hyperlipidemia, unspecified: Secondary | ICD-10-CM | POA: Insufficient documentation

## 2014-06-26 DIAGNOSIS — I10 Essential (primary) hypertension: Secondary | ICD-10-CM | POA: Diagnosis not present

## 2014-06-26 DIAGNOSIS — E119 Type 2 diabetes mellitus without complications: Secondary | ICD-10-CM | POA: Diagnosis not present

## 2014-06-26 DIAGNOSIS — Z23 Encounter for immunization: Secondary | ICD-10-CM | POA: Diagnosis not present

## 2014-06-26 DIAGNOSIS — Z7982 Long term (current) use of aspirin: Secondary | ICD-10-CM | POA: Insufficient documentation

## 2014-06-26 LAB — COMPLETE METABOLIC PANEL WITH GFR
ALT: 10 U/L (ref 0–35)
AST: 15 U/L (ref 0–37)
Albumin: 3.9 g/dL (ref 3.5–5.2)
Alkaline Phosphatase: 42 U/L (ref 39–117)
BUN: 20 mg/dL (ref 6–23)
CO2: 26 meq/L (ref 19–32)
CREATININE: 1.13 mg/dL — AB (ref 0.50–1.10)
Calcium: 9 mg/dL (ref 8.4–10.5)
Chloride: 105 mEq/L (ref 96–112)
GFR, EST AFRICAN AMERICAN: 59 mL/min — AB
GFR, EST NON AFRICAN AMERICAN: 51 mL/min — AB
GLUCOSE: 126 mg/dL — AB (ref 70–99)
Potassium: 4.6 mEq/L (ref 3.5–5.3)
Sodium: 139 mEq/L (ref 135–145)
TOTAL PROTEIN: 6.1 g/dL (ref 6.0–8.3)
Total Bilirubin: 0.5 mg/dL (ref 0.2–1.2)

## 2014-06-26 LAB — GLUCOSE, POCT (MANUAL RESULT ENTRY): POC Glucose: 126 mg/dl — AB (ref 70–99)

## 2014-06-26 LAB — POCT GLYCOSYLATED HEMOGLOBIN (HGB A1C): Hemoglobin A1C: 7.6

## 2014-06-26 MED ORDER — TRAMADOL HCL 50 MG PO TABS: 50.0000 mg | ORAL_TABLET | Freq: Three times a day (TID) | ORAL | Status: DC | PRN

## 2014-06-26 MED ORDER — LOSARTAN POTASSIUM 100 MG PO TABS: 100.0000 mg | ORAL_TABLET | Freq: Every day | ORAL | Status: DC

## 2014-06-26 MED ORDER — AMLODIPINE BESYLATE 10 MG PO TABS: 10.0000 mg | ORAL_TABLET | Freq: Every day | ORAL | Status: DC

## 2014-06-26 MED ORDER — PANTOPRAZOLE SODIUM 40 MG PO TBEC: 40.0000 mg | DELAYED_RELEASE_TABLET | Freq: Every day | ORAL | Status: DC

## 2014-06-26 MED ORDER — ATORVASTATIN CALCIUM 20 MG PO TABS: 20.0000 mg | ORAL_TABLET | Freq: Every day | ORAL | Status: DC

## 2014-06-26 MED ORDER — INDAPAMIDE 1.25 MG PO TABS: 1.2500 mg | ORAL_TABLET | Freq: Every morning | ORAL | Status: DC

## 2014-06-26 MED ORDER — METFORMIN HCL 850 MG PO TABS: 850.0000 mg | ORAL_TABLET | Freq: Two times a day (BID) | ORAL | Status: DC

## 2014-06-26 MED ORDER — HYDRALAZINE HCL 25 MG PO TABS: 25.0000 mg | ORAL_TABLET | Freq: Three times a day (TID) | ORAL | Status: DC

## 2014-06-26 MED ORDER — LINAGLIPTIN 5 MG PO TABS: 5.0000 mg | ORAL_TABLET | Freq: Every day | ORAL | Status: DC

## 2014-06-26 MED ORDER — ASPIRIN EC 81 MG PO TBEC: 81.0000 mg | DELAYED_RELEASE_TABLET | Freq: Every day | ORAL | Status: AC

## 2014-06-26 MED ORDER — TRAZODONE 25 MG HALF TABLET: 25.0000 mg | ORAL_TABLET | Freq: Every evening | ORAL | Status: DC | PRN

## 2014-06-26 NOTE — Progress Notes (Signed)
Patient here to follow up on DM type 2-battery in your glucose monitor so she has been unable to check sugars She normally checks in the am before breakfast and then again at lunch.  Has not been keeping a log-information given to patient regarding recording her sugars and log given Needs medication refills on tramadol, trazadone, and indapamide. Patient not smoking currently. Patient asking about flu shot and says she had PNA shot in hospital.

## 2014-06-26 NOTE — Patient Instructions (Signed)
Diabetes and Foot Care Diabetes may cause you to have problems because of poor blood supply (circulation) to your feet and legs. This may cause the skin on your feet to become thinner, break easier, and heal more slowly. Your skin may become dry, and the skin may peel and crack. You may also have nerve damage in your legs and feet causing decreased feeling in them. You may not notice minor injuries to your feet that could lead to infections or more serious problems. Taking care of your feet is one of the most important things you can do for yourself.  HOME CARE INSTRUCTIONS  Wear shoes at all times, even in the house. Do not go barefoot. Bare feet are easily injured.  Check your feet daily for blisters, cuts, and redness. If you cannot see the bottom of your feet, use a mirror or ask someone for help.  Wash your feet with warm water (do not use hot water) and mild soap. Then pat your feet and the areas between your toes until they are completely dry. Do not soak your feet as this can dry your skin.  Apply a moisturizing lotion or petroleum jelly (that does not contain alcohol and is unscented) to the skin on your feet and to dry, brittle toenails. Do not apply lotion between your toes.  Trim your toenails straight across. Do not dig under them or around the cuticle. File the edges of your nails with an emery board or nail file.  Do not cut corns or calluses or try to remove them with medicine.  Wear clean socks or stockings every day. Make sure they are not too tight. Do not wear knee-high stockings since they may decrease blood flow to your legs.  Wear shoes that fit properly and have enough cushioning. To break in new shoes, wear them for just a few hours a day. This prevents you from injuring your feet. Always look in your shoes before you put them on to be sure there are no objects inside.  Do not cross your legs. This may decrease the blood flow to your feet.  If you find a minor scrape,  cut, or break in the skin on your feet, keep it and the skin around it clean and dry. These areas may be cleansed with mild soap and water. Do not cleanse the area with peroxide, alcohol, or iodine.  When you remove an adhesive bandage, be sure not to damage the skin around it.  If you have a wound, look at it several times a day to make sure it is healing.  Do not use heating pads or hot water bottles. They may burn your skin. If you have lost feeling in your feet or legs, you may not know it is happening until it is too late.  Make sure your health care provider performs a complete foot exam at least annually or more often if you have foot problems. Report any cuts, sores, or bruises to your health care provider immediately. SEEK MEDICAL CARE IF:   You have an injury that is not healing.  You have cuts or breaks in the skin.  You have an ingrown nail.  You notice redness on your legs or feet.  You feel burning or tingling in your legs or feet.  You have pain or cramps in your legs and feet.  Your legs or feet are numb.  Your feet always feel cold. SEEK IMMEDIATE MEDICAL CARE IF:   There is increasing redness,   swelling, or pain in or around a wound.  There is a red line that goes up your leg.  Pus is coming from a wound.  You develop a fever or as directed by your health care provider.  You notice a bad smell coming from an ulcer or wound. Document Released: 06/10/2000 Document Revised: 02/13/2013 Document Reviewed: 11/20/2012 Exit[employer] Patient Information 2015 [employer]. This information is not intended to replace advice given to you by your health care provider. Make sure you discuss any questions you have with your health care provider.  

## 2014-06-26 NOTE — Progress Notes (Signed)
Patient ID: [name], female   DOB: [birth year], 63 y.o.   MRN: [id number] SUBJECTIVE: 63 y.o. female for follow up of diabetes. Diabetic Review of Systems - medication compliance: compliant all of the time, diabetic diet compliance: compliant most of the time, home glucose monitoring: is not performed due to no battery for machine, last eye exam approximately 3 ago.  Other symptoms and concerns: Takes BP medication daily without skipped doses.  Does not exercise.  She is compliant with DASH diet.      Pain in lower back and joints d/t arthritis. Feeling stiff and pain in the leg.    Current Outpatient Prescriptions  Medication Sig Dispense Refill  . albuterol (PROVENTIL HFA;VENTOLIN HFA) 108 (90 BASE) MCG/ACT inhaler Inhale 2 puffs into the lungs every 6 (six) hours as needed for wheezing or shortness of breath. 1 Inhaler 2  . amLODipine (NORVASC) 10 MG tablet Take 1 tablet (10 mg total) by mouth daily. 30 tablet 1  . aspirin EC 81 MG tablet Take 1 tablet (81 mg total) by mouth daily. 30 tablet 1  . atorvastatin (LIPITOR) 20 MG tablet Take 1 tablet (20 mg total) by mouth daily. 30 tablet 1  . glucose blood (FREESTYLE TEST STRIPS) test strip Use a test once a day Dx code: 250.00 100 each 12  . hydrALAZINE (APRESOLINE) 25 MG tablet TAKE 1 TABLET THREE TIMES DAILY 180 tablet 1  . indapamide (LOZOL) 1.25 MG tablet TAKE 1 TABLET EVERY MORNING 60 tablet 1  . Lancets (FREESTYLE) lancets Use a test once a day Dx code: 250.00 100 each 12  . losartan (COZAAR) 100 MG tablet Take 1 tablet (100 mg total) by mouth daily. 30 tablet 1  . metFORMIN (GLUCOPHAGE) 850 MG tablet Take 1 tablet (850 mg total) by mouth 2 (two) times daily with a meal. 60 tablet 1  . metoCLOPramide (REGLAN) 10 MG tablet Take 10 mg by mouth 4 (four) times daily.    . Multiple Vitamin (MULTIVITAMIN WITH MINERALS) TABS tablet Take 1 tablet by mouth daily.    . pantoprazole (PROTONIX) 40 MG tablet Take 1 tablet (40 mg total) by  mouth daily. 30 tablet 1  . TRADJENTA 5 MG TABS tablet TAKE 1 TABLET EVERY DAY 90 tablet 3  . traMADol (ULTRAM) 50 MG tablet Take 1 tablet (50 mg total) by mouth 3 (three) times daily as needed. For pain 45 tablet 1  . traZODone (DESYREL) 25 mg TABS tablet Take 0.5 tablets (25 mg total) by mouth at bedtime as needed for sleep. 30 tablet 3  . Blood Glucose Monitoring Suppl (FREESTYLE LITE) DEVI Use a test once a day Dx code: 250.00 1 each 0  . calcium-vitamin D 250-100 MG-UNIT per tablet Take 1 tablet by mouth daily. 30 tablet 1  . Cholecalciferol (VITAMIN D) 2000 UNITS tablet Take 2,000 Units by mouth daily.    . ciprofloxacin (CIPRO) 500 MG tablet Take 1 tablet (500 mg total) by mouth 2 (two) times daily. 14 tablet 0  . ibuprofen (ADVIL,MOTRIN) 200 MG tablet Take 200 mg by mouth every 6 (six) hours as needed for moderate pain.    . pantoprazole (PROTONIX) 40 MG tablet Take 1 tablet (40 mg total) by mouth daily. 30 tablet 5   No current facility-administered medications for this visit.    OBJECTIVE: Appearance: alert, well appearing, and in no distress, oriented to person, place, and time and overweight. BP 121/75 mmHg  Pulse 69  Temp(Src) 98.4 F (36.9 C)  Resp 16  Ht 5\' 7"  (1.702 m)  Wt 247 lb (112.038 kg)  BMI 38.68 kg/m2  SpO2 96%  Exam: heart sounds normal rate, regular rhythm, normal S1, S2, no murmurs, rubs, clicks or gallops, normal bilateral carotid upstroke without bruits, no JVD, chest clear, no carotid bruits, feet: warm, good capillary refill, dry cracking heels, normal DP and PT pulses, normal monofilament exam and normal sensory exam  ASSESSMENT: Diabetes Mellitus: stable  PLAN: See orders for this visit as documented in the electronic medical record. Issues reviewed with her: diabetic diet discussed in detail, written exchange diet given, low cholesterol diet, weight control and daily exercise discussed, foot care discussed and Podiatry visits discussed, annual eye  examinations at Ophthalmology discussed and long term diabetic complications discussed.  [name] was seen today for follow-up.  Diagnoses and associated orders for this visit:  Type 2 diabetes mellitus without complication - Glucose (CBG) - HgB A1c - linagliptin (TRADJENTA) 5 MG TABS tablet; Take 1 tablet (5 mg total) by mouth daily. - metFORMIN (GLUCOPHAGE) 850 MG tablet; Take 1 tablet (850 mg total) by mouth 2 (two) times daily with a meal. - Microalbumin, urine - Ambulatory referral to Ophthalmology  Essential hypertension - indapamide (LOZOL) 1.25 MG tablet; Take 1 tablet (1.25 mg total) by mouth every morning. - losartan (COZAAR) 100 MG tablet; Take 1 tablet (100 mg total) by mouth daily. - hydrALAZINE (APRESOLINE) 25 MG tablet; Take 1 tablet (25 mg total) by mouth 3 (three) times daily. - amLODipine (NORVASC) 10 MG tablet; Take 1 tablet (10 mg total) by mouth daily. - COMPLETE METABOLIC PANEL WITH GFR Patient blood pressure is stable and may continue on current medication.  Education on diet, exercise, and modifiable risk factors discussed. Will obtain appropriate labs as needed. Will follow up in 3-6 months.   Hyperlipidemia - atorvastatin (LIPITOR) 20 MG tablet; Take 1 tablet (20 mg total) by mouth daily. - aspirin EC 81 MG tablet; Take 1 tablet (81 mg total) by mouth daily.  Insomnia - traZODone (DESYREL) 25 mg TABS tablet; Take 0.5 tablets (25 mg total) by mouth at bedtime as needed for sleep. Sleep hygiene discussed  Gastroesophageal reflux disease, esophagitis presence not specified - pantoprazole (PROTONIX) 40 MG tablet; Take 1 tablet (40 mg total) by mouth daily. Discussed diet and weight with patient relating to acid reflux.  Went over things that may exacerbate acid reflux such as tomatoes, spicy foods, coffee, carbonated beverages, chocolates, etc.  Advised patient to avoid laying down at least two hours after meals and sleep with HOB elevated.   Other  Orders - traMADol (ULTRAM) 50 MG tablet; Take 1 tablet (50 mg total) by mouth 3 (three) times daily as needed. For pain   Chari Manning, NP 06/27/2014 1:14 PM

## 2014-06-27 LAB — MICROALBUMIN, URINE: Microalb, Ur: 2 mg/dL (ref <2.0)

## 2014-07-02 ENCOUNTER — Telehealth: Payer: Self-pay

## 2014-07-02 NOTE — Telephone Encounter (Signed)
Returned patient phone call Patient was calling in reference to her Lozol Patient had not yet received it from [facility] i informed patient it was confirmed they received it electronically Patient states she will call pharmacy and if need be we can always resend Prescription if pharmacy did not receive

## 2014-08-22 ENCOUNTER — Other Ambulatory Visit: Payer: Self-pay | Admitting: Internal Medicine

## 2014-09-05 ENCOUNTER — Telehealth: Payer: Self-pay | Admitting: Emergency Medicine

## 2014-09-05 NOTE — Telephone Encounter (Signed)
[facility] needs to know if the patient can have a 90 day Rx for Trazadone 50mg 

## 2014-09-08 NOTE — Telephone Encounter (Signed)
[facility] needs to know if the patient can have a 90 day Rx for Trazadone 50mg    c

## 2014-09-10 ENCOUNTER — Telehealth: Payer: Self-pay | Admitting: Emergency Medicine

## 2014-09-10 ENCOUNTER — Encounter: Payer: Self-pay | Admitting: Emergency Medicine

## 2014-09-12 ENCOUNTER — Telehealth: Payer: Self-pay | Admitting: Internal Medicine

## 2014-09-12 NOTE — Telephone Encounter (Signed)
Pharmacy called requesting medication Trazadon 50mg  tab for #90 day supply. Please f/u with pharmacy is approved

## 2014-10-13 ENCOUNTER — Other Ambulatory Visit: Payer: Self-pay | Admitting: Internal Medicine

## 2015-02-22 ENCOUNTER — Encounter (HOSPITAL_COMMUNITY): Payer: Self-pay

## 2015-02-22 ENCOUNTER — Emergency Department (HOSPITAL_COMMUNITY): Payer: Medicare HMO

## 2015-02-22 ENCOUNTER — Emergency Department (HOSPITAL_COMMUNITY)
Admission: EM | Admit: 2015-02-22 | Discharge: 2015-02-22 | Disposition: A | Discharge status: Home / Self Care | Payer: Medicare HMO | Attending: Emergency Medicine | Admitting: Emergency Medicine

## 2015-02-22 DIAGNOSIS — I2510 Atherosclerotic heart disease of native coronary artery without angina pectoris: Secondary | ICD-10-CM | POA: Diagnosis not present

## 2015-02-22 DIAGNOSIS — I10 Essential (primary) hypertension: Secondary | ICD-10-CM | POA: Diagnosis not present

## 2015-02-22 DIAGNOSIS — Z8673 Personal history of transient ischemic attack (TIA), and cerebral infarction without residual deficits: Secondary | ICD-10-CM | POA: Diagnosis not present

## 2015-02-22 DIAGNOSIS — Z862 Personal history of diseases of the blood and blood-forming organs and certain disorders involving the immune mechanism: Secondary | ICD-10-CM | POA: Diagnosis not present

## 2015-02-22 DIAGNOSIS — E785 Hyperlipidemia, unspecified: Secondary | ICD-10-CM | POA: Diagnosis not present

## 2015-02-22 DIAGNOSIS — K219 Gastro-esophageal reflux disease without esophagitis: Secondary | ICD-10-CM | POA: Diagnosis not present

## 2015-02-22 DIAGNOSIS — Z8614 Personal history of Methicillin resistant Staphylococcus aureus infection: Secondary | ICD-10-CM | POA: Diagnosis not present

## 2015-02-22 DIAGNOSIS — F329 Major depressive disorder, single episode, unspecified: Secondary | ICD-10-CM | POA: Insufficient documentation

## 2015-02-22 DIAGNOSIS — Z79899 Other long term (current) drug therapy: Secondary | ICD-10-CM | POA: Insufficient documentation

## 2015-02-22 DIAGNOSIS — M1990 Unspecified osteoarthritis, unspecified site: Secondary | ICD-10-CM | POA: Diagnosis not present

## 2015-02-22 DIAGNOSIS — Z87891 Personal history of nicotine dependence: Secondary | ICD-10-CM | POA: Diagnosis not present

## 2015-02-22 DIAGNOSIS — Z7982 Long term (current) use of aspirin: Secondary | ICD-10-CM | POA: Diagnosis not present

## 2015-02-22 DIAGNOSIS — E119 Type 2 diabetes mellitus without complications: Secondary | ICD-10-CM | POA: Insufficient documentation

## 2015-02-22 DIAGNOSIS — M25552 Pain in left hip: Secondary | ICD-10-CM | POA: Diagnosis present

## 2015-02-22 DIAGNOSIS — M5136 Other intervertebral disc degeneration, lumbar region: Secondary | ICD-10-CM | POA: Insufficient documentation

## 2015-02-22 DIAGNOSIS — M25559 Pain in unspecified hip: Secondary | ICD-10-CM

## 2015-02-22 MED ORDER — OXYCODONE-ACETAMINOPHEN 5-325 MG PO TABS
1.0000 | ORAL_TABLET | Freq: Once | ORAL | Status: AC
  Administered 2015-02-22: 1 via ORAL
  Filled 2015-02-22: qty 1

## 2015-02-22 MED ORDER — ONDANSETRON 4 MG PO TBDP: 4.0000 mg | ORAL_TABLET | Freq: Three times a day (TID) | ORAL | Status: DC | PRN

## 2015-02-22 MED ORDER — ONDANSETRON 8 MG PO TBDP
8.0000 mg | ORAL_TABLET | Freq: Once | ORAL | Status: AC
  Administered 2015-02-22: 8 mg via ORAL
  Filled 2015-02-22: qty 1

## 2015-02-22 MED ORDER — HYDROMORPHONE HCL 1 MG/ML IJ SOLN
1.0000 mg | Freq: Once | INTRAMUSCULAR | Status: AC
  Administered 2015-02-22: 1 mg via INTRAMUSCULAR
  Filled 2015-02-22: qty 1

## 2015-02-22 MED ORDER — HYDROCODONE-ACETAMINOPHEN 5-325 MG PO TABS: 1.0000 | ORAL_TABLET | ORAL | Status: DC | PRN

## 2015-02-22 NOTE — ED Notes (Signed)
Discharge instructions reviewed with patient and husband ° °

## 2015-02-22 NOTE — ED Provider Notes (Signed)
CSN: [id number]     Arrival date & time 02/22/15  [id number] History   First MD Initiated Contact with Patient 02/22/15 1106     Chief Complaint  Patient presents with  . Hip Pain     (Consider location/radiation/quality/duration/timing/severity/associated sxs/prior Treatment) HPI Comments: Pt is a 64 yo female with history of degenerative disc disease who presents to the ED with complaint of left hip pain, onset 1 week. Pt reports worsening pain to her left lower back and hip that radiates around to her anterior hip/groin region. She states that the pain was tolerable until Tuesday when she tripped and fell on her left hip. Denies head injury, LOC. She states pain is aggravated with any movement. Pt denies fever, numbness, tingling, loss of bowel or bladder, weakness, IVDU, cancer or recent spinal manipulation. Denies abdominal pain, N/V/D, urinary sxs. Pt reports she has tried taking ibuprofen at home without relief. She notes she has had difficulty ambulating at home due to the pain. She notes she has a walker at home that she uses intermittently.    Past Medical History  Diagnosis Date  . Coronary artery disease     a. s/p CABG 06/2010: S-D2; S-PDA (at time of MV surgery).  . 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 mitral valve - s/p resection and MV repair 06/2010 Dr. [name].  . Hypertension   . Stroke     a. 11/2009: mitral mass diagnosed at this time, also has distal R MCA stenosis, tx with coumadin. b. Readmitted 05/2010 with TIA symptoms - had not been taking Coumadin. s/p MV surgery 06/2010. Coumadin stopped 2013 after review of chart by Dr. [name] since mass was removed (stroke felt possibly related to this).   [name] Hx of transient ischemic attack (TIA)     a. See stroke section.  . Anemia, iron deficiency   . Diabetes mellitus     type II  . Stenosis of middle cerebral artery     a. Distal R MCA.  [name] Abscess in epidural space of L2-L5 lumbar spine 03/2006  . Arthritis   . Colon  polyp     a. Multiple colonic polyps status post colonoscopy in October 2007, consistent with tubular adenoma, tubulovillous adenoma with no high-grade dysplasia or malignancy identified.   . Hepatitis     ?  [name] Sleep apnea   . Cataract   . Depression   . GERD (gastroesophageal reflux disease)   . Hyperlipidemia   . MRSA infection     a. History of recurrent skin infection and soft tissue abscesses, with MRSA positive in the past.  . Hypoxia     a. Has history of acute hypoxic respiratory failure in the setting of bronchitis/PNA or prior admissions.   Past Surgical History  Procedure Laterality Date  . Vesicovaginal fistula closure w/ tah    . History of ankle fractures requiring surgery    . Mv repair and resection of mass  1/12  . Coronary artery bypass graft  1/12  . Total abdominal hysterectomy  1978  . Tonsillectomy  1971   Family History  Problem Relation Age of Onset  . Colon cancer Mother   . Prostate cancer Father   . Cirrhosis Sister     died of GI bleed associated with cirrhosis of the liver  . Stroke Brother   . Diabetes Brother     x3  . Kidney disease Brother   . Irritable bowel syndrome Mother    Social  History  Substance Use Topics  . Smoking status: Former Smoker    Quit date: 10/14/2010  . Smokeless tobacco: Never Used  . Alcohol Use: No   OB History    No data available     Review of Systems  Musculoskeletal: Positive for back pain.       Left hip pain.  All other systems reviewed and are negative.     Allergies  Review of patient's allergies indicates no known allergies.  Home Medications   Prior to Admission medications   Medication Sig Start Date End Date Taking? Authorizing Provider  albuterol (PROVENTIL HFA;VENTOLIN HFA) 108 (90 BASE) MCG/ACT inhaler Inhale 2 puffs into the lungs every 6 (six) hours as needed for wheezing or shortness of breath. 02/07/14  Yes Tresa Garter, MD  amLODipine (NORVASC) 10 MG tablet Take 1 tablet  (10 mg total) by mouth daily. 06/26/14  Yes Lance Bosch, NP  aspirin EC 81 MG tablet Take 1 tablet (81 mg total) by mouth daily. 06/26/14  Yes Lance Bosch, NP  atorvastatin (LIPITOR) 20 MG tablet Take 1 tablet (20 mg total) by mouth daily. 06/26/14  Yes Lance Bosch, NP  Blood Glucose Monitoring Suppl (FREESTYLE LITE) DEVI Use a test once a day Dx code: 250.00 01/10/14  Yes Robbie Lis, MD  glucose blood (FREESTYLE TEST STRIPS) test strip Use a test once a day Dx code: 250.00 01/10/14  Yes Robbie Lis, MD  hydrALAZINE (APRESOLINE) 25 MG tablet Take 1 tablet (25 mg total) by mouth 3 (three) times daily. 06/26/14  Yes Lance Bosch, NP  ibuprofen (ADVIL,MOTRIN) 200 MG tablet Take 200 mg by mouth every 4 (four) hours as needed for fever, headache, mild pain, moderate pain or cramping.    Yes Historical Provider, MD  indapamide (LOZOL) 1.25 MG tablet Take 1 tablet (1.25 mg total) by mouth every morning. 06/26/14  Yes Lance Bosch, NP  Lancets (FREESTYLE) lancets Use a test once a day Dx code: 250.00 01/10/14  Yes Robbie Lis, MD  linagliptin (TRADJENTA) 5 MG TABS tablet Take 1 tablet (5 mg total) by mouth daily. 06/26/14  Yes Lance Bosch, NP  losartan (COZAAR) 100 MG tablet Take 1 tablet (100 mg total) by mouth daily. 06/26/14  Yes Lance Bosch, NP  metFORMIN (GLUCOPHAGE) 850 MG tablet Take 1 tablet (850 mg total) by mouth 2 (two) times daily with a meal. 06/26/14  Yes Lance Bosch, NP  metoCLOPramide (REGLAN) 10 MG tablet Take 10 mg by mouth 3 (three) times daily before meals.    Yes Historical Provider, MD  pantoprazole (PROTONIX) 40 MG tablet Take 1 tablet (40 mg total) by mouth daily. 06/26/14  Yes Lance Bosch, NP  calcium-vitamin D 250-100 MG-UNIT per tablet Take 1 tablet by mouth daily. Patient not taking: Reported on 02/22/2015 12/27/13   Caren Griffins, MD  ciprofloxacin (CIPRO) 500 MG tablet Take 1 tablet (500 mg total) by mouth 2 (two) times daily. Patient not taking:  Reported on 02/22/2015 03/06/14   Margarita Mail, PA-C  HYDROcodone-acetaminophen (NORCO/VICODIN) 5-325 MG per tablet Take 1 tablet by mouth every 4 (four) hours as needed. 02/22/15   Nona Dell, PA-C  ondansetron (ZOFRAN ODT) 4 MG disintegrating tablet Take 1 tablet (4 mg total) by mouth every 8 (eight) hours as needed for nausea. 02/22/15   Nona Dell, PA-C  pantoprazole (PROTONIX) 40 MG tablet Take 1 tablet (40 mg total) by mouth daily.  10/10/12 10/10/13  Jerene Bears, MD  traZODone (DESYREL) 25 mg TABS tablet Take 0.5 tablets (25 mg total) by mouth at bedtime as needed for sleep. Patient not taking: Reported on 02/22/2015 06/26/14   Lance Bosch, NP   BP 176/82 mmHg  Pulse 74  Temp(Src) 98 F (36.7 C) (Oral)  Resp 18  SpO2 94% Physical Exam  Constitutional: She is oriented to person, place, and time. She appears well-developed and well-nourished.  HENT:  Head: Normocephalic and atraumatic. Head is without raccoon's eyes, without Battle's sign, without abrasion and without contusion.  Eyes: Conjunctivae and EOM are normal. Pupils are equal, round, and reactive to light. Right eye exhibits no discharge. Left eye exhibits no discharge. No scleral icterus.  Neck: Normal range of motion. Neck supple.  Cardiovascular: Normal rate, regular rhythm, normal heart sounds and intact distal pulses.   Pulmonary/Chest: Effort normal and breath sounds normal. She has no wheezes. She has no rales. She exhibits no tenderness.  Abdominal: Soft. Bowel sounds are normal. She exhibits no mass. There is no tenderness. There is no rebound and no guarding.  Musculoskeletal: She exhibits tenderness. She exhibits no edema.  TTP at left paraspinal muscles and left anterior groin/hip. No midline C/T/L tenderness. Decreased ROM of left hip due to pain. Pt able to stand but unable to ambulate due to pain. No deformity, contusions, abrasions noted.  Neurological: She is alert and oriented to person,  place, and time. She has normal strength and normal reflexes. No cranial nerve deficit or sensory deficit.  Decreased strength with flexion of left hip due to pain.  Skin: Skin is warm and dry.  Nursing note and vitals reviewed.   ED Course  Procedures (including critical care time) Labs Review Labs Reviewed - No data to display  Imaging Review Mr Lumbar Spine Wo Contrast  02/22/2015   CLINICAL DATA:  Low back pain extending into the hip, worse with movement. Patient fell 1 week ago. Difficulty walking. No previous relevant surgery. Initial encounter.  EXAM: MRI LUMBAR SPINE WITHOUT CONTRAST  TECHNIQUE: Multiplanar, multisequence MR imaging of the lumbar spine was performed. No intravenous contrast was administered.  COMPARISON:  Lumbar spine radiographs 10/12/2012.  FINDINGS: Radiographs demonstrate 5 lumbar type vertebral bodies. There is a mild convex left scoliosis. The lateral alignment is normal. There is no evidence of acute fracture or pars defect. There are chronic advanced endplate degenerative changes at L3-4 and L4-5. The lumbar pedicles are short on a congenital basis.  The conus medullaris extends to the L1 level and appears normal. No paraspinal abnormalities are identified.  No significant disc space findings from T11-12 through L1-2.  L2-3: There is annular disc bulging with a broad-based extraforaminal disc protrusion on the left. There appears to be a focal extruded disc fragment extending superiorly within the lateral aspect of the left foramen, best seen on axial image 15 of series 7. The fat surrounding the exiting left L2 nerve root is effaced, and there is probable left L2 nerve root encroachment. Facet and ligamentous hypertrophy contribute to mild spinal stenosis.  L3-4: Chronic degenerative disc disease with loss of disc height, annular disc bulging and endplate degeneration asymmetric to the right. There is mild facet and ligamentous hypertrophy contributing to mild spinal  stenosis and mild narrowing of the lateral recesses. There is moderate right and mild left foraminal narrowing.  L4-5: Chronic degenerative disc disease with loss of disc height, annular disc bulging and endplate degeneration. Moderate facet and ligamentous hypertrophy.  These factors contribute to mild spinal stenosis with fairly symmetric narrowing of the lateral recesses and foramina bilaterally.  L5-S1: Disc height and hydration are maintained. Mild bilateral facet hypertrophy. No spinal stenosis or nerve root encroachment.  IMPRESSION: 1. Focal extraforaminal disc extrusion on the left at L2-3 causing probable left L2 nerve root encroachment. This seems the most likely explanation for the patient's current symptoms. 2. Chronic degenerative disc disease at L3-4 and L4-5 with loss of disc height and prominent endplate degeneration. At both levels, there is mild spinal stenosis and narrowing of the lateral recesses. There is also some foraminal narrowing which appears greatest on the right at L3-4. The findings at these levels may be contributory to the patient's symptoms.   Electronically Signed   By: Richardean Sale M.D.   On: 02/22/2015 14:42   Mr Hip Left Wo Contrast  02/22/2015   CLINICAL DATA:  Low back pain extending into the hip, worse with movement. Patient fell 1 week ago. Difficulty walking. No previous relevant surgery. Initial encounter.  EXAM: MR OF THE LEFT HIP WITHOUT CONTRAST  TECHNIQUE: Multiplanar, multisequence MR imaging was performed. No intravenous contrast was administered.  COMPARISON:  Left hip radiographs 02/22/2015.  Lumbar MRI today.  FINDINGS: Bones: Both femoral heads appear normal without evidence of acute fracture, dislocation or avascular necrosis. The visualized bony pelvis appears normal. The sacroiliac joints and symphysis pubis appear normal. Lower lumbar spine findings deferred to earlier examination.  Articular cartilage and labrum  Articular cartilage:  Mild degenerative  changes of both hips per  Labrum: There is no gross labral tear or paralabral abnormality.  Joint or bursal effusion  Joint effusion: No significant hip joint effusion.  Bursae: No focal periarticular fluid collection.  Muscles and tendons  Muscles and tendons: Mild symmetric gluteus tendinosis. The hamstring and iliopsoas tendons appear normal. The piriformis muscles are symmetric.  Other findings  Miscellaneous: A small amount of free pelvic fluid is noted status post hysterectomy. No evidence of adnexal mass.  IMPRESSION: 1. No acute or significant findings demonstrated in the pelvis or hips. 2. Mild degenerative changes of both hips and mild gluteus tendinosis bilaterally. 3. See separate examination of the lumbar spine.   Electronically Signed   By: Richardean Sale M.D.   On: 02/22/2015 15:24   Dg Hip Unilat With Pelvis 2-3 Views Left  02/22/2015   CLINICAL DATA:  Fall, left hip pain.  Fall on 02/21/2015.  EXAM: DG HIP (WITH OR WITHOUT PELVIS) 2-3V LEFT  COMPARISON:  None.  FINDINGS: Mild symmetric degenerative changes in the hips bilaterally. Degenerate changes in the visualized lower lumbar spine. SI joints are symmetric and unremarkable. No acute bony abnormality. Specifically, no fracture, subluxation, or dislocation. Soft tissues are intact.  IMPRESSION: No acute bony abnormality.   Electronically Signed   By: Rolm Baptise M.D.   On: 02/22/2015 11:53   I have personally reviewed and evaluated these images and lab results as part of my medical decision-making.  Filed Vitals:   02/22/15 1517  BP: 176/82  Pulse: 74  Temp:   Resp: 18   Meds given in ED:  Medications  HYDROmorphone (DILAUDID) injection 1 mg (1 mg Intramuscular Given 02/22/15 1150)  oxyCODONE-acetaminophen (PERCOCET/ROXICET) 5-325 MG per tablet 1 tablet (1 tablet Oral Given 02/22/15 1542)  ondansetron (ZOFRAN-ODT) disintegrating tablet 8 mg (8 mg Oral Given 02/22/15 1541)    New Prescriptions   HYDROCODONE-ACETAMINOPHEN  (NORCO/VICODIN) 5-325 MG PER TABLET    Take 1 tablet by  mouth every 4 (four) hours as needed.   ONDANSETRON (ZOFRAN ODT) 4 MG DISINTEGRATING TABLET    Take 1 tablet (4 mg total) by mouth every 8 (eight) hours as needed for nausea.     MDM   Final diagnoses:  Degenerative disc disease, lumbar    Pt presents with left lower back and hip pain that worsened s/p fall. No neuro deficits. No prior history of back surgeries. Ordered xray. Pt given pain medications.   Xray reveals no acute bony abnormality. Pt reports her pain has improved. Pt able to ambulate to restroom but unable to walk back to her room without assistance due to pain. MRI ordered to further evaluate left hip and lumbar spine. Pt reported nausea and pain after returning from MRI, pt given meds for symptomatic relief. Pt reports pain and nausea have improved.  MRI revealed no acute or significant findings, mild degenerative changes of both hips, focal extraforaminal discextrusion on left at L2-3 causing probable left L2 nerve root enroachment. This appears to be the likley cause of the pt's current sxs.   Evaluation does not show pathology requring ongoing emergent intervention or admission. Pt is hemodynamically stable and mentating appropriately. Discussed findings/results and plan with patient/guardian, who agrees with plan. All questions answered. Pt advised to follow up with Neurosurgery, contact info provided. Pt d/c home with rx for Zofran and Norco. Return precautions discussed and outpatient follow up given.     Chesley Noon [place], [place] 02/22/15 1550  Dorie Rank, MD 02/22/15 [phone]

## 2015-02-22 NOTE — ED Notes (Signed)
Pt states pain in left side going down into hip bone.  Pt states pain is worse with movement.  Pt did fall Monday tripping but pain was there before fall.  Pt denies changes in urination.  States she has hx of sciatic pain and feels it is same.

## 2015-02-22 NOTE — ED Notes (Signed)
She had ambulated with much trouble and with limp, however, she did manage to ambulate to our b.r. And back with minimal steadying assistance.  She is aware that our provider has ordered mri.  She states the "pain medicine really helped--I feel pretty good when I'm not walking".

## 2015-02-22 NOTE — ED Notes (Signed)
Patient transported to X-ray 

## 2015-02-22 NOTE — Discharge Instructions (Signed)
Please call the Neurosurgery office for follow up. Please take your prescriptions of Zofran for nausea and Norco for pain control as prescribed. Please return to the emergency department if symptoms worsen.

## 2015-02-22 NOTE — ED Notes (Signed)
Bed: WA04 Expected date:  Expected time:  Means of arrival:  Comments: 

## 2015-02-22 NOTE — ED Notes (Signed)
She is in mri as I write this.

## 2015-02-25 ENCOUNTER — Emergency Department (HOSPITAL_COMMUNITY)
Admission: EM | Admit: 2015-02-25 | Discharge: 2015-02-25 | Disposition: A | Discharge status: Home / Self Care | Payer: Medicare HMO | Attending: Emergency Medicine | Admitting: Emergency Medicine

## 2015-02-25 ENCOUNTER — Encounter (HOSPITAL_COMMUNITY): Payer: Self-pay | Admitting: Emergency Medicine

## 2015-02-25 DIAGNOSIS — Z7982 Long term (current) use of aspirin: Secondary | ICD-10-CM | POA: Insufficient documentation

## 2015-02-25 DIAGNOSIS — I2510 Atherosclerotic heart disease of native coronary artery without angina pectoris: Secondary | ICD-10-CM | POA: Diagnosis not present

## 2015-02-25 DIAGNOSIS — E119 Type 2 diabetes mellitus without complications: Secondary | ICD-10-CM | POA: Diagnosis not present

## 2015-02-25 DIAGNOSIS — Z86010 Personal history of colonic polyps: Secondary | ICD-10-CM | POA: Insufficient documentation

## 2015-02-25 DIAGNOSIS — Z862 Personal history of diseases of the blood and blood-forming organs and certain disorders involving the immune mechanism: Secondary | ICD-10-CM | POA: Insufficient documentation

## 2015-02-25 DIAGNOSIS — M545 Low back pain: Secondary | ICD-10-CM | POA: Diagnosis present

## 2015-02-25 DIAGNOSIS — I10 Essential (primary) hypertension: Secondary | ICD-10-CM | POA: Diagnosis not present

## 2015-02-25 DIAGNOSIS — Z8673 Personal history of transient ischemic attack (TIA), and cerebral infarction without residual deficits: Secondary | ICD-10-CM | POA: Diagnosis not present

## 2015-02-25 DIAGNOSIS — M1990 Unspecified osteoarthritis, unspecified site: Secondary | ICD-10-CM | POA: Insufficient documentation

## 2015-02-25 DIAGNOSIS — Z87891 Personal history of nicotine dependence: Secondary | ICD-10-CM | POA: Diagnosis not present

## 2015-02-25 DIAGNOSIS — H269 Unspecified cataract: Secondary | ICD-10-CM | POA: Insufficient documentation

## 2015-02-25 DIAGNOSIS — Z79899 Other long term (current) drug therapy: Secondary | ICD-10-CM | POA: Diagnosis not present

## 2015-02-25 DIAGNOSIS — M549 Dorsalgia, unspecified: Secondary | ICD-10-CM

## 2015-02-25 DIAGNOSIS — Z951 Presence of aortocoronary bypass graft: Secondary | ICD-10-CM | POA: Insufficient documentation

## 2015-02-25 DIAGNOSIS — Z8614 Personal history of Methicillin resistant Staphylococcus aureus infection: Secondary | ICD-10-CM | POA: Diagnosis not present

## 2015-02-25 DIAGNOSIS — F329 Major depressive disorder, single episode, unspecified: Secondary | ICD-10-CM | POA: Diagnosis not present

## 2015-02-25 DIAGNOSIS — K219 Gastro-esophageal reflux disease without esophagitis: Secondary | ICD-10-CM | POA: Insufficient documentation

## 2015-02-25 DIAGNOSIS — E785 Hyperlipidemia, unspecified: Secondary | ICD-10-CM | POA: Insufficient documentation

## 2015-02-25 LAB — CBC WITH DIFFERENTIAL/PLATELET
BASOS PCT: 0 % (ref 0–1)
Basophils Absolute: 0 10*3/uL (ref 0.0–0.1)
EOS ABS: 0 10*3/uL (ref 0.0–0.7)
Eosinophils Relative: 0 % (ref 0–5)
HCT: 42.3 % (ref 36.0–46.0)
HEMOGLOBIN: 14.1 g/dL (ref 12.0–15.0)
LYMPHS ABS: 2.7 10*3/uL (ref 0.7–4.0)
Lymphocytes Relative: 41 % (ref 12–46)
MCH: 27 pg (ref 26.0–34.0)
MCHC: 33.3 g/dL (ref 30.0–36.0)
MCV: 80.9 fL (ref 78.0–100.0)
Monocytes Absolute: 0.4 10*3/uL (ref 0.1–1.0)
Monocytes Relative: 5 % (ref 3–12)
NEUTROS PCT: 54 % (ref 43–77)
Neutro Abs: 3.5 10*3/uL (ref 1.7–7.7)
Platelets: 221 10*3/uL (ref 150–400)
RBC: 5.23 MIL/uL — AB (ref 3.87–5.11)
RDW: 14.3 % (ref 11.5–15.5)
WBC: 6.7 10*3/uL (ref 4.0–10.5)

## 2015-02-25 LAB — BASIC METABOLIC PANEL
ANION GAP: 13 (ref 5–15)
BUN: 18 mg/dL (ref 6–20)
CHLORIDE: 103 mmol/L (ref 101–111)
CO2: 22 mmol/L (ref 22–32)
Calcium: 9.9 mg/dL (ref 8.9–10.3)
Creatinine, Ser: 1.09 mg/dL — ABNORMAL HIGH (ref 0.44–1.00)
GFR calc non Af Amer: 52 mL/min — ABNORMAL LOW (ref >60)
Glucose, Bld: 168 mg/dL — ABNORMAL HIGH (ref 65–99)
POTASSIUM: 4.1 mmol/L (ref 3.5–5.1)
SODIUM: 138 mmol/L (ref 135–145)

## 2015-02-25 MED ORDER — PREDNISONE 20 MG PO TABS: 40.0000 mg | ORAL_TABLET | Freq: Every day | ORAL | Status: DC

## 2015-02-25 MED ORDER — DEXAMETHASONE SODIUM PHOSPHATE 10 MG/ML IJ SOLN
10.0000 mg | Freq: Once | INTRAMUSCULAR | Status: AC
  Administered 2015-02-25: 10 mg via INTRAVENOUS
  Filled 2015-02-25: qty 1

## 2015-02-25 MED ORDER — LORAZEPAM 2 MG/ML IJ SOLN
1.0000 mg | Freq: Once | INTRAMUSCULAR | Status: AC
  Administered 2015-02-25: 1 mg via INTRAVENOUS
  Filled 2015-02-25: qty 1

## 2015-02-25 MED ORDER — DIAZEPAM 5 MG PO TABS: 5.0000 mg | ORAL_TABLET | ORAL | Status: DC | PRN

## 2015-02-25 MED ORDER — SODIUM CHLORIDE 0.9 % IV SOLN
INTRAVENOUS | Status: DC
  Administered 2015-02-25: 15:00:00 via INTRAVENOUS

## 2015-02-25 MED ORDER — OXYCODONE-ACETAMINOPHEN 7.5-325 MG PO TABS: 1.0000 | ORAL_TABLET | ORAL | Status: DC | PRN

## 2015-02-25 MED ORDER — HYDROMORPHONE HCL 1 MG/ML IJ SOLN
1.0000 mg | Freq: Once | INTRAMUSCULAR | Status: AC
  Administered 2015-02-25: 1 mg via INTRAVENOUS
  Filled 2015-02-25: qty 1

## 2015-02-25 NOTE — Progress Notes (Signed)
Pt has an already scheduled appt with [facility] dr Feliciana Rossetti on 03/06/15 at 1200  \Cm entered in to d/c instructions

## 2015-02-25 NOTE — ED Notes (Signed)
Per pt, states she has bulging disc-was seen and evaluated on Sunday-has appointment with neurosurgeon in Sept

## 2015-02-25 NOTE — Discharge Instructions (Signed)
Back Pain, Adult Low back pain is very common. About 1 in 5 people have back pain.The cause of low back pain is rarely dangerous. The pain often gets better over time.About half of people with a sudden onset of back pain feel better in just 2 weeks. About 8 in 10 people feel better by 6 weeks.  CAUSES Some common causes of back pain include:  Strain of the muscles or ligaments supporting the spine.  Wear and tear (degeneration) of the spinal discs.  Arthritis.  Direct injury to the back. DIAGNOSIS Most of the time, the direct cause of low back pain is not known.However, back pain can be treated effectively even when the exact cause of the pain is unknown.Answering your caregiver's questions about your overall health and symptoms is one of the most accurate ways to make sure the cause of your pain is not dangerous. If your caregiver needs more information, he or she may order lab work or imaging tests (X-rays or MRIs).However, even if imaging tests show changes in your back, this usually does not require surgery. HOME CARE INSTRUCTIONS For many people, back pain returns.Since low back pain is rarely dangerous, it is often a condition that people can learn to manageon their own.   Remain active. It is stressful on the back to sit or stand in one place. Do not sit, drive, or stand in one place for more than 30 minutes at a time. Take short walks on level surfaces as soon as pain allows.Try to increase the length of time you walk each day.  Do not stay in bed.Resting more than 1 or 2 days can delay your recovery.  Do not avoid exercise or work.Your body is made to move.It is not dangerous to be active, even though your back may hurt.Your back will likely heal faster if you return to being active before your pain is gone.  Pay attention to your body when you bend and lift. Many people have less discomfortwhen lifting if they bend their knees, keep the load close to their bodies,and  avoid twisting. Often, the most comfortable positions are those that put less stress on your recovering back.  Find a comfortable position to sleep. Use a firm mattress and lie on your side with your knees slightly bent. If you lie on your back, put a pillow under your knees.  Only take over-the-counter or prescription medicines as directed by your caregiver. Over-the-counter medicines to reduce pain and inflammation are often the most helpful.Your caregiver may prescribe muscle relaxant drugs.These medicines help dull your pain so you can more quickly return to your normal activities and healthy exercise.  Put ice on the injured area.  Put ice in a plastic bag.  Place a towel between your skin and the bag.  Leave the ice on for 15-20 minutes, 03-04 times a day for the first 2 to 3 days. After that, ice and heat may be alternated to reduce pain and spasms.  Ask your caregiver about trying back exercises and gentle massage. This may be of some benefit.  Avoid feeling anxious or stressed.Stress increases muscle tension and can worsen back pain.It is important to recognize when you are anxious or stressed and learn ways to manage it.Exercise is a great option. SEEK MEDICAL CARE IF:  You have pain that is not relieved with rest or medicine.  You have pain that does not improve in 1 week.  You have new symptoms.  You are generally not feeling well. SEEK   IMMEDIATE MEDICAL CARE IF:   You have pain that radiates from your back into your legs.  You develop new bowel or bladder control problems.  You have unusual weakness or numbness in your arms or legs.  You develop nausea or vomiting.  You develop abdominal pain.  You feel faint. Document Released: 06/13/2005 Document Revised: 12/13/2011 Document Reviewed: 10/15/2013 Exit[employer] Patient Information 2015 [employer]. This information is not intended to replace advice given to you by your health care provider. Make sure you  discuss any questions you have with your health care provider.  

## 2015-02-25 NOTE — ED Notes (Signed)
RN at bedside. Will get labs with IV start.

## 2015-02-25 NOTE — ED Notes (Addendum)
Pt discharged to home with husband. Pain is controlled.  Pt verbalized understanding of discharge instructions and follow up care.

## 2015-02-25 NOTE — ED Provider Notes (Signed)
CSN: [id number]     Arrival date & time 02/25/15  1234 History   First MD Initiated Contact with Patient 02/25/15 1411     Chief Complaint  Patient presents with  . Back Pain     (Consider location/radiation/quality/duration/timing/severity/associated sxs/prior Treatment) HPI Comments: Patient here complaining of worsening lower back pain. Seen for similar symptoms 3 days ago and diagnosed with MRI as having disc impingement. No neurological findings. Has been given referral for outpatient neurosurgical evaluation. Pain has continued and characterized as sharp and worse with movement. No bowel or bladder dysfunction. Has used hydrocodone without relief. Denies any trauma. Pain is somewhat better with remaining still  Patient is a 64 y.o. female presenting with back pain. The history is provided by the patient and a relative.  Back Pain   Past Medical History  Diagnosis Date  . Coronary artery disease     a. s/p CABG 06/2010: S-D2; S-PDA (at time of MV surgery).  . 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 mitral valve - s/p resection and MV repair 06/2010 Dr. [name].  . Hypertension   . Stroke     a. 11/2009: mitral mass diagnosed at this time, also has distal R MCA stenosis, tx with coumadin. b. Readmitted 05/2010 with TIA symptoms - had not been taking Coumadin. s/p MV surgery 06/2010. Coumadin stopped 2013 after review of chart by Dr. [name] since mass was removed (stroke felt possibly related to this).   [name] Hx of transient ischemic attack (TIA)     a. See stroke section.  . Anemia, iron deficiency   . Diabetes mellitus     type II  . Stenosis of middle cerebral artery     a. Distal R MCA.  [name] Abscess in epidural space of L2-L5 lumbar spine 03/2006  . Arthritis   . Colon polyp     a. Multiple colonic polyps status post colonoscopy in October 2007, consistent with tubular adenoma, tubulovillous adenoma with no high-grade dysplasia or malignancy identified.   . Hepatitis     ?  [name]  Sleep apnea   . Cataract   . Depression   . GERD (gastroesophageal reflux disease)   . Hyperlipidemia   . MRSA infection     a. History of recurrent skin infection and soft tissue abscesses, with MRSA positive in the past.  . Hypoxia     a. Has history of acute hypoxic respiratory failure in the setting of bronchitis/PNA or prior admissions.   Past Surgical History  Procedure Laterality Date  . Vesicovaginal fistula closure w/ tah    . History of ankle fractures requiring surgery    . Mv repair and resection of mass  1/12  . Coronary artery bypass graft  1/12  . Total abdominal hysterectomy  1978  . Tonsillectomy  1971   Family History  Problem Relation Age of Onset  . Colon cancer Mother   . Prostate cancer Father   . Cirrhosis Sister     died of GI bleed associated with cirrhosis of the liver  . Stroke Brother   . Diabetes Brother     x3  . Kidney disease Brother   . Irritable bowel syndrome Mother    Social History  Substance Use Topics  . Smoking status: Former Smoker    Quit date: 10/14/2010  . Smokeless tobacco: Never Used  . Alcohol Use: No   OB History    No data available     Review of Systems  Musculoskeletal: Positive for back pain.  All other systems reviewed and are negative.     Allergies  Review of patient's allergies indicates no known allergies.  Home Medications   Prior to Admission medications   Medication Sig Start Date End Date Taking? Authorizing Provider  albuterol (PROVENTIL HFA;VENTOLIN HFA) 108 (90 BASE) MCG/ACT inhaler Inhale 2 puffs into the lungs every 6 (six) hours as needed for wheezing or shortness of breath. 02/07/14  Yes Tresa Garter, MD  amLODipine (NORVASC) 10 MG tablet Take 1 tablet (10 mg total) by mouth daily. 06/26/14  Yes Lance Bosch, NP  aspirin EC 81 MG tablet Take 1 tablet (81 mg total) by mouth daily. 06/26/14  Yes Lance Bosch, NP  atorvastatin (LIPITOR) 20 MG tablet Take 1 tablet (20 mg total) by  mouth daily. 06/26/14  Yes Lance Bosch, NP  Blood Glucose Monitoring Suppl (FREESTYLE LITE) DEVI Use a test once a day Dx code: 250.00 01/10/14  Yes Robbie Lis, MD  glucose blood (FREESTYLE TEST STRIPS) test strip Use a test once a day Dx code: 250.00 01/10/14  Yes Robbie Lis, MD  hydrALAZINE (APRESOLINE) 25 MG tablet Take 1 tablet (25 mg total) by mouth 3 (three) times daily. 06/26/14  Yes Lance Bosch, NP  ibuprofen (ADVIL,MOTRIN) 200 MG tablet Take 200 mg by mouth every 4 (four) hours as needed for fever, headache, mild pain, moderate pain or cramping.    Yes Historical Provider, MD  indapamide (LOZOL) 1.25 MG tablet Take 1 tablet (1.25 mg total) by mouth every morning. 06/26/14  Yes Lance Bosch, NP  Lancets (FREESTYLE) lancets Use a test once a day Dx code: 250.00 01/10/14  Yes Robbie Lis, MD  linagliptin (TRADJENTA) 5 MG TABS tablet Take 1 tablet (5 mg total) by mouth daily. 06/26/14  Yes Lance Bosch, NP  losartan (COZAAR) 100 MG tablet Take 1 tablet (100 mg total) by mouth daily. 06/26/14  Yes Lance Bosch, NP  metFORMIN (GLUCOPHAGE) 850 MG tablet Take 1 tablet (850 mg total) by mouth 2 (two) times daily with a meal. 06/26/14  Yes Lance Bosch, NP  metoCLOPramide (REGLAN) 10 MG tablet Take 10 mg by mouth 3 (three) times daily before meals.    Yes Historical Provider, MD  pantoprazole (PROTONIX) 40 MG tablet Take 1 tablet (40 mg total) by mouth daily. 06/26/14  Yes Lance Bosch, NP  calcium-vitamin D 250-100 MG-UNIT per tablet Take 1 tablet by mouth daily. Patient not taking: Reported on 02/22/2015 12/27/13   Caren Griffins, MD  ciprofloxacin (CIPRO) 500 MG tablet Take 1 tablet (500 mg total) by mouth 2 (two) times daily. Patient not taking: Reported on 02/22/2015 03/06/14   Margarita Mail, PA-C  HYDROcodone-acetaminophen (NORCO/VICODIN) 5-325 MG per tablet Take 1 tablet by mouth every 4 (four) hours as needed. Patient not taking: Reported on 02/25/2015 02/22/15   Nona Dell, PA-C  ondansetron (ZOFRAN ODT) 4 MG disintegrating tablet Take 1 tablet (4 mg total) by mouth every 8 (eight) hours as needed for nausea. Patient not taking: Reported on 02/25/2015 02/22/15   Nona Dell, PA-C  pantoprazole (PROTONIX) 40 MG tablet Take 1 tablet (40 mg total) by mouth daily. Patient not taking: Reported on 02/25/2015 10/10/12 02/25/15  Jerene Bears, MD  traZODone (DESYREL) 25 mg TABS tablet Take 0.5 tablets (25 mg total) by mouth at bedtime as needed for sleep. Patient not taking: Reported on 02/22/2015 06/26/14  Lance Bosch, NP   BP 200/105 mmHg  Pulse 99  Temp(Src) 98.2 F (36.8 C) (Oral)  Resp 20  SpO2 97% Physical Exam  Constitutional: She is oriented to person, place, and time. She appears well-developed and well-nourished.  Non-toxic appearance. No distress.  HENT:  Head: Normocephalic and atraumatic.  Eyes: Conjunctivae, EOM and lids are normal. Pupils are equal, round, and reactive to light.  Neck: Normal range of motion. Neck supple. No tracheal deviation present. No thyroid mass present.  Cardiovascular: Normal rate, regular rhythm and normal heart sounds.  Exam reveals no gallop.   No murmur heard. Pulmonary/Chest: Effort normal and breath sounds normal. No stridor. No respiratory distress. She has no decreased breath sounds. She has no wheezes. She has no rhonchi. She has no rales.  Abdominal: Soft. Normal appearance and bowel sounds are normal. She exhibits no distension. There is no tenderness. There is no rebound and no CVA tenderness.  Musculoskeletal: Normal range of motion. She exhibits no edema or tenderness.       Back:  Neurological: She is alert and oriented to person, place, and time. She has normal strength. No cranial nerve deficit or sensory deficit. GCS eye subscore is 4. GCS verbal subscore is 5. GCS motor subscore is 6.  Skin: Skin is warm and dry. No abrasion and no rash noted.  Psychiatric: She has a normal mood  and affect. Her speech is normal and behavior is normal.  Nursing note and vitals reviewed.   ED Course  Procedures (including critical care time) Labs Review Labs Reviewed  CBC WITH DIFFERENTIAL/PLATELET  BASIC METABOLIC PANEL    Imaging Review No results found. I have personally reviewed and evaluated these images and lab results as part of my medical decision-making.   EKG Interpretation None      MDM   Final diagnoses:  None    Patient given pain meds here feels better. Neurological exam is stable. Will follow with the neurosurgeon  Lacretia Leigh, MD 02/25/15 [phone]

## 2015-03-06 ENCOUNTER — Ambulatory Visit: Payer: Medicare HMO | Attending: Internal Medicine | Admitting: Internal Medicine

## 2015-03-06 ENCOUNTER — Encounter: Payer: Self-pay | Admitting: Internal Medicine

## 2015-03-06 VITALS — BP 143/82 | HR 85 | Temp 98.0°F | Resp 16

## 2015-03-06 DIAGNOSIS — M545 Low back pain, unspecified: Secondary | ICD-10-CM

## 2015-03-06 DIAGNOSIS — S30820A Blister (nonthermal) of lower back and pelvis, initial encounter: Secondary | ICD-10-CM | POA: Insufficient documentation

## 2015-03-06 DIAGNOSIS — E119 Type 2 diabetes mellitus without complications: Secondary | ICD-10-CM | POA: Diagnosis not present

## 2015-03-06 LAB — POCT GLYCOSYLATED HEMOGLOBIN (HGB A1C): Hemoglobin A1C: 8.5

## 2015-03-06 LAB — GLUCOSE, POCT (MANUAL RESULT ENTRY): POC GLUCOSE: 248 mg/dL — AB (ref 70–99)

## 2015-03-06 MED ORDER — HYDROCODONE-ACETAMINOPHEN 5-325 MG PO TABS: 1.0000 | ORAL_TABLET | Freq: Four times a day (QID) | ORAL | Status: DC | PRN

## 2015-03-06 MED ORDER — METHOCARBAMOL 500 MG PO TABS: 500.0000 mg | ORAL_TABLET | Freq: Three times a day (TID) | ORAL | Status: DC

## 2015-03-06 NOTE — Progress Notes (Signed)
Patient here for follow up on her back pain Patients son is asking if she can have some flexeril and something else for pain Patient does have an appointment with neuro surgeon coming up this month

## 2015-03-06 NOTE — Progress Notes (Signed)
Patient ID: [name], female   DOB: Nov 03, 1950, 64 y.o.   MRN: [id number]  CC: Back pain,  rash  HPI: [name] is a 64 y.o. female here today for a ED follow-up for back pain.  Patient has past medical history of coronary artery disease, hypertension, stroke, diabetes, sleep apnea. Patient was seen in the ED on 8/28 and 8/31 with complaint of left hip pain and lower back pain. Pt reports worsening pain to her left lower back and hip that radiates around to her anterior hip/groin region. She states that the pain was tolerable until Tuesday when she tripped and fell on her left hip. Denies head injury or LOC. She states pain is aggravated with any movement. Pt denies fever, numbness, tingling, loss of bowel or bladder, weakness. Pt reports she has tried taking ibuprofen at home without relief. She notes she has had difficulty ambulating at home due to the pain. She notes she has a walker at home that she uses intermittently. Pain has now been present for over 2 weeks. She is requesting a muscle relaxer and notes that she has a appointment next week with neurosurgery since her last MRI on 8/28 revealed L2 nerve root encroachment and foraminal narrowing at L3-4.   patient also complains of a rash on her left buttocks that has been present for 4 days.  Patient reports that the area is itchy and red and feels sore to touch.  Patient states she's had these clusters of bumps on her buttocks before that only come in at one area but they quickly blister up scab over and disappear.      Patient has No headache, No chest pain, No abdominal pain - No Nausea, No new weakness tingling or numbness, No Cough - SOB.  No Known Allergies Past Medical History  Diagnosis Date  . Coronary artery disease     a. s/p CABG 06/2010: S-D2; S-PDA (at time of MV surgery).  . 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 mitral valve - s/p resection and MV repair 06/2010 Dr. [name].  . Hypertension   . Stroke     a.  11/2009: mitral mass diagnosed at this time, also has distal R MCA stenosis, tx with coumadin. b. Readmitted 05/2010 with TIA symptoms - had not been taking Coumadin. s/p MV surgery 06/2010. Coumadin stopped 2013 after review of chart by Dr. [name] since mass was removed (stroke felt possibly related to this).   [name] Hx of transient ischemic attack (TIA)     a. See stroke section.  . Anemia, iron deficiency   . Diabetes mellitus     type II  . Stenosis of middle cerebral artery     a. Distal R MCA.  [name] Abscess in epidural space of L2-L5 lumbar spine 03/2006  . Arthritis   . Colon polyp     a. Multiple colonic polyps status post colonoscopy in October 2007, consistent with tubular adenoma, tubulovillous adenoma with no high-grade dysplasia or malignancy identified.   . Hepatitis     ?  [name] Sleep apnea   . Cataract   . Depression   . GERD (gastroesophageal reflux disease)   . Hyperlipidemia   . MRSA infection     a. History of recurrent skin infection and soft tissue abscesses, with MRSA positive in the past.  . Hypoxia     a. Has history of acute hypoxic respiratory failure in the setting of bronchitis/PNA or prior admissions.   Current Outpatient  Prescriptions on File Prior to Visit  Medication Sig Dispense Refill  . amLODipine (NORVASC) 10 MG tablet Take 1 tablet (10 mg total) by mouth daily. 30 tablet 3  . aspirin EC 81 MG tablet Take 1 tablet (81 mg total) by mouth daily. 30 tablet 5  . atorvastatin (LIPITOR) 20 MG tablet Take 1 tablet (20 mg total) by mouth daily. 30 tablet 3  . diazepam (VALIUM) 5 MG tablet Take 1 tablet (5 mg total) by mouth every 4 (four) hours as needed for anxiety. 30 tablet 0  . hydrALAZINE (APRESOLINE) 25 MG tablet Take 1 tablet (25 mg total) by mouth 3 (three) times daily. 180 tablet 3  . HYDROcodone-acetaminophen (NORCO/VICODIN) 5-325 MG per tablet Take 1 tablet by mouth every 4 (four) hours as needed. 10 tablet 0  . ibuprofen (ADVIL,MOTRIN) 200 MG tablet Take  200 mg by mouth every 4 (four) hours as needed for fever, headache, mild pain, moderate pain or cramping.     . indapamide (LOZOL) 1.25 MG tablet Take 1 tablet (1.25 mg total) by mouth every morning. 60 tablet 2  . linagliptin (TRADJENTA) 5 MG TABS tablet Take 1 tablet (5 mg total) by mouth daily. 30 tablet 3  . losartan (COZAAR) 100 MG tablet Take 1 tablet (100 mg total) by mouth daily. 30 tablet 3  . metFORMIN (GLUCOPHAGE) 850 MG tablet Take 1 tablet (850 mg total) by mouth 2 (two) times daily with a meal. 60 tablet 3  . metoCLOPramide (REGLAN) 10 MG tablet Take 10 mg by mouth 3 (three) times daily before meals.     . pantoprazole (PROTONIX) 40 MG tablet Take 1 tablet (40 mg total) by mouth daily. 30 tablet 3  . predniSONE (DELTASONE) 20 MG tablet Take 2 tablets (40 mg total) by mouth daily with breakfast. 10 tablet 0  . albuterol (PROVENTIL HFA;VENTOLIN HFA) 108 (90 BASE) MCG/ACT inhaler Inhale 2 puffs into the lungs every 6 (six) hours as needed for wheezing or shortness of breath. 1 Inhaler 2  . Blood Glucose Monitoring Suppl (FREESTYLE LITE) DEVI Use a test once a day Dx code: 250.00 1 each 0  . calcium-vitamin D 250-100 MG-UNIT per tablet Take 1 tablet by mouth daily. (Patient not taking: Reported on 02/22/2015) 30 tablet 1  . glucose blood (FREESTYLE TEST STRIPS) test strip Use a test once a day Dx code: 250.00 100 each 12  . Lancets (FREESTYLE) lancets Use a test once a day Dx code: 250.00 100 each 12  . ondansetron (ZOFRAN ODT) 4 MG disintegrating tablet Take 1 tablet (4 mg total) by mouth every 8 (eight) hours as needed for nausea. (Patient not taking: Reported on 02/25/2015) 10 tablet 0  . oxyCODONE-acetaminophen (PERCOCET) 7.5-325 MG per tablet Take 1 tablet by mouth every 4 (four) hours as needed for severe pain. 30 tablet 0  . traZODone (DESYREL) 25 mg TABS tablet Take 0.5 tablets (25 mg total) by mouth at bedtime as needed for sleep. (Patient not taking: Reported on 02/22/2015) 30  tablet 3   No current facility-administered medications on file prior to visit.   Family History  Problem Relation Age of Onset  . Colon cancer Mother   . Prostate cancer Father   . Cirrhosis Sister     died of GI bleed associated with cirrhosis of the liver  . Stroke Brother   . Diabetes Brother     x3  . Kidney disease Brother   . Irritable bowel syndrome Mother    Social  History   Social History  . Marital Status: Married    Spouse Name: N/A  . Number of Children: 3  . Years of Education: N/A   Occupational History  .     Social History Main Topics  . Smoking status: Former Smoker    Quit date: 10/14/2010  . Smokeless tobacco: Never Used  . Alcohol Use: No  . Drug Use: No  . Sexual Activity: No   Other Topics Concern  . Not on file   Social History Narrative   Lives with husband, stay at home, uses cane occasionally, still active/ambulatory.     Review of Systems: Other than what is stated in HPI, all other systems are negative.   Objective:   Filed Vitals:   03/06/15 1215  BP: 143/82  Pulse: 85  Temp: 98 F (36.7 C)  Resp: 16    Physical Exam  Constitutional: She is oriented to person, place, and time.  Cardiovascular: Normal rate, regular rhythm and normal heart sounds.   Pulmonary/Chest: Effort normal and breath sounds normal.  Musculoskeletal: She exhibits tenderness ( left paraspinal muscle ). She exhibits no edema.  Neurological: She is alert and oriented to person, place, and time.  Skin: Skin is warm and dry. Rash ( cluster of blisters on left buttocks wwith surrounding erythematouss skin) noted.  Psychiatric: She has a normal mood and affect.     Lab Results  Component Value Date   WBC 6.7 02/25/2015   HGB 14.1 02/25/2015   HCT 42.3 02/25/2015   MCV 80.9 02/25/2015   PLT 221 02/25/2015   Lab Results  Component Value Date   CREATININE 1.09* 02/25/2015   BUN 18 02/25/2015   NA 138 02/25/2015   K 4.1 02/25/2015   CL 103  02/25/2015   CO2 22 02/25/2015    Lab Results  Component Value Date   HGBA1C 8.50 03/06/2015   Lipid Panel     Component Value Date/Time   CHOL 217* 12/25/2013 0500   TRIG 53 12/25/2013 0500   HDL 118 12/25/2013 0500   CHOLHDL 1.8 12/25/2013 0500   VLDL 11 12/25/2013 0500   LDLCALC 88 12/25/2013 0500       Assessment and plan:   Samanthia was seen today for follow-up.  Diagnoses and all orders for this visit:  Left-sided low back pain without sciatica -      Begin methocarbamol (ROBAXIN) 500 MG tablet; Take 1 tablet (500 mg total) by mouth 3 (three) times daily. -     Begin HYDROcodone-acetaminophen (NORCO/VICODIN) 5-325 MG per tablet; Take 1 tablet by mouth every 6 (six) hours as needed.  patient has a follow-up appointment with neurosurgery on September 12.  Find a patient this will be a one-time short refill of narcotic pain medicine she is able to get him a neurosurgery.  Blister of buttock, initial encounter -     Herpes simplex virus(hsv) dna by pcr  Blister on the buttocks appear to be herpes zoster versus herpes simplex  Type 2 diabetes mellitus without complication -     Glucose (CBG) -     HgB A1c   Patient's A1c has increased since last visit 9 months ago.  I have asked patient make a follow-up appointment in 2 weeks to discuss hypertension and diabetes  Return in about 2 weeks (around 03/20/2015) for DM/HTN follow up.       Lance Bosch, [facility] [phone] 03/06/2015, 12:22 PM

## 2015-03-10 LAB — HERPES SIMPLEX VIRUS(HSV) DNA BY PCR

## 2015-03-11 ENCOUNTER — Other Ambulatory Visit: Payer: Self-pay | Admitting: Internal Medicine

## 2015-03-11 ENCOUNTER — Telehealth: Payer: Self-pay | Admitting: Internal Medicine

## 2015-03-11 DIAGNOSIS — Z952 Presence of prosthetic heart valve: Secondary | ICD-10-CM

## 2015-03-11 NOTE — Telephone Encounter (Signed)
Nurse from Dr. Barnett Abu surgeon called to states that patient will need a L2-L3 diskectomy performed soon but she will need surgical clearance from a Cardiologist due to history of mitral valve repair in the past. She was last seen by [facility] in 2015. I will send referral.  Lance Bosch, NP 03/11/2015 2:28 PM

## 2015-03-23 ENCOUNTER — Ambulatory Visit: Payer: Commercial Managed Care - HMO | Admitting: Internal Medicine

## 2015-03-26 ENCOUNTER — Ambulatory Visit: Payer: Medicare HMO | Attending: Internal Medicine | Admitting: Internal Medicine

## 2015-03-26 ENCOUNTER — Encounter: Payer: Self-pay | Admitting: Internal Medicine

## 2015-03-26 VITALS — BP 148/76 | HR 80 | Temp 98.2°F | Resp 16 | Ht 67.0 in | Wt 241.0 lb

## 2015-03-26 DIAGNOSIS — E2839 Other primary ovarian failure: Secondary | ICD-10-CM | POA: Diagnosis not present

## 2015-03-26 DIAGNOSIS — I10 Essential (primary) hypertension: Secondary | ICD-10-CM | POA: Insufficient documentation

## 2015-03-26 DIAGNOSIS — E785 Hyperlipidemia, unspecified: Secondary | ICD-10-CM | POA: Insufficient documentation

## 2015-03-26 DIAGNOSIS — E119 Type 2 diabetes mellitus without complications: Secondary | ICD-10-CM | POA: Diagnosis present

## 2015-03-26 DIAGNOSIS — M545 Low back pain, unspecified: Secondary | ICD-10-CM

## 2015-03-26 DIAGNOSIS — Z794 Long term (current) use of insulin: Secondary | ICD-10-CM | POA: Insufficient documentation

## 2015-03-26 DIAGNOSIS — Z87891 Personal history of nicotine dependence: Secondary | ICD-10-CM | POA: Insufficient documentation

## 2015-03-26 DIAGNOSIS — Z1231 Encounter for screening mammogram for malignant neoplasm of breast: Secondary | ICD-10-CM | POA: Diagnosis not present

## 2015-03-26 DIAGNOSIS — Z1239 Encounter for other screening for malignant neoplasm of breast: Secondary | ICD-10-CM

## 2015-03-26 LAB — GLUCOSE, POCT (MANUAL RESULT ENTRY): POC Glucose: 149 mg/dl — AB (ref 70–99)

## 2015-03-26 MED ORDER — METFORMIN HCL 850 MG PO TABS: 850.0000 mg | ORAL_TABLET | Freq: Two times a day (BID) | ORAL | Status: DC

## 2015-03-26 MED ORDER — ATORVASTATIN CALCIUM 20 MG PO TABS: 20.0000 mg | ORAL_TABLET | Freq: Every day | ORAL | Status: DC

## 2015-03-26 MED ORDER — METHOCARBAMOL 500 MG PO TABS: 500.0000 mg | ORAL_TABLET | Freq: Three times a day (TID) | ORAL | Status: DC

## 2015-03-26 MED ORDER — LOSARTAN POTASSIUM 100 MG PO TABS: 100.0000 mg | ORAL_TABLET | Freq: Every day | ORAL | Status: DC

## 2015-03-26 MED ORDER — AMLODIPINE BESYLATE 10 MG PO TABS: 10.0000 mg | ORAL_TABLET | Freq: Every day | ORAL | Status: DC

## 2015-03-26 MED ORDER — LINAGLIPTIN 5 MG PO TABS: 5.0000 mg | ORAL_TABLET | Freq: Every day | ORAL | Status: DC

## 2015-03-26 MED ORDER — HYDRALAZINE HCL 25 MG PO TABS: 25.0000 mg | ORAL_TABLET | Freq: Three times a day (TID) | ORAL | Status: DC

## 2015-03-26 NOTE — Progress Notes (Signed)
Patient ID: [name], female   DOB: March 07, 1951, 64 y.o.   MRN: [id number] SUBJECTIVE: 64 y.o. female for follow up of diabetes and HTN. Patient reports that she took her blood pressure medication right before entering the room. Diabetic Review of Systems - medication compliance: compliant all of the time, diabetic diet compliance: noncompliant much of the time, home glucose monitoring: is performed regularly, fasting values range 129-159, last eye exam approximately 2 years ago.  Other symptoms and concerns: She reports that she has been seen by Orthopedics and was told that she needs clearance from Cardiology before she can have repair of bulging disc. She notes improvement in pain with Vicodin and Robaxin.   Current Outpatient Prescriptions  Medication Sig Dispense Refill  . amLODipine (NORVASC) 10 MG tablet Take 1 tablet (10 mg total) by mouth daily. 30 tablet 3  . aspirin EC 81 MG tablet Take 1 tablet (81 mg total) by mouth daily. 30 tablet 5  . atorvastatin (LIPITOR) 20 MG tablet Take 1 tablet (20 mg total) by mouth daily. 30 tablet 3  . diazepam (VALIUM) 5 MG tablet Take 1 tablet (5 mg total) by mouth every 4 (four) hours as needed for anxiety. 30 tablet 0  . hydrALAZINE (APRESOLINE) 25 MG tablet Take 1 tablet (25 mg total) by mouth 3 (three) times daily. 180 tablet 3  . HYDROcodone-acetaminophen (NORCO/VICODIN) 5-325 MG per tablet Take 1 tablet by mouth every 6 (six) hours as needed. 60 tablet 0  . indapamide (LOZOL) 1.25 MG tablet Take 1 tablet (1.25 mg total) by mouth every morning. 60 tablet 2  . linagliptin (TRADJENTA) 5 MG TABS tablet Take 1 tablet (5 mg total) by mouth daily. 30 tablet 3  . losartan (COZAAR) 100 MG tablet Take 1 tablet (100 mg total) by mouth daily. 30 tablet 3  . metFORMIN (GLUCOPHAGE) 850 MG tablet Take 1 tablet (850 mg total) by mouth 2 (two) times daily with a meal. 60 tablet 3  . methocarbamol (ROBAXIN) 500 MG tablet Take 1 tablet (500 mg total) by mouth 3  (three) times daily. 60 tablet 0  . metoCLOPramide (REGLAN) 10 MG tablet Take 10 mg by mouth 3 (three) times daily before meals.     . ondansetron (ZOFRAN ODT) 4 MG disintegrating tablet Take 1 tablet (4 mg total) by mouth every 8 (eight) hours as needed for nausea. 10 tablet 0  . pantoprazole (PROTONIX) 40 MG tablet Take 1 tablet (40 mg total) by mouth daily. 30 tablet 3  . predniSONE (DELTASONE) 20 MG tablet Take 2 tablets (40 mg total) by mouth daily with breakfast. 10 tablet 0  . albuterol (PROVENTIL HFA;VENTOLIN HFA) 108 (90 BASE) MCG/ACT inhaler Inhale 2 puffs into the lungs every 6 (six) hours as needed for wheezing or shortness of breath. 1 Inhaler 2  . Blood Glucose Monitoring Suppl (FREESTYLE LITE) DEVI Use a test once a day Dx code: 250.00 1 each 0  . calcium-vitamin D 250-100 MG-UNIT per tablet Take 1 tablet by mouth daily. (Patient not taking: Reported on 02/22/2015) 30 tablet 1  . glucose blood (FREESTYLE TEST STRIPS) test strip Use a test once a day Dx code: 250.00 100 each 12  . ibuprofen (ADVIL,MOTRIN) 200 MG tablet Take 200 mg by mouth every 4 (four) hours as needed for fever, headache, mild pain, moderate pain or cramping.     . Lancets (FREESTYLE) lancets Use a test once a day Dx code: 250.00 100 each 12  . oxyCODONE-acetaminophen (PERCOCET) 7.5-325  MG per tablet Take 1 tablet by mouth every 4 (four) hours as needed for severe pain. 30 tablet 0  . traZODone (DESYREL) 25 mg TABS tablet Take 0.5 tablets (25 mg total) by mouth at bedtime as needed for sleep. (Patient not taking: Reported on 02/22/2015) 30 tablet 3   No current facility-administered medications for this visit.   Review of Systems  Musculoskeletal: Positive for back pain.  Neurological: Positive for tingling.  All other systems reviewed and are negative.   OBJECTIVE: Physical Exam  Constitutional: She is oriented to person, place, and time.  Neck: No JVD present.  Cardiovascular: Normal rate, regular rhythm  and normal heart sounds.   Pulses:      Dorsalis pedis pulses are 2+ on the right side, and 2+ on the left side.       Posterior tibial pulses are 2+ on the right side, and 2+ on the left side.  Pulmonary/Chest: Effort normal and breath sounds normal.  Musculoskeletal: She exhibits no edema or tenderness.  Feet:  Right Foot:  Protective Sensation: 10 sites tested.10 sites sensed. Skin Integrity: Negative for skin breakdown.  Left Foot:  Protective Sensation: 10 sites tested. 10 sites sensed. Skin Integrity: Negative for skin breakdown.  Neurological: She is alert and oriented to person, place, and time.     BP 153/77 mmHg  Pulse 80  Temp(Src) 98.2 F (36.8 C)  Resp 16  Ht 5\' 7"  (1.702 m)  Wt 241 lb (109.317 kg)  BMI 37.74 kg/m2  SpO2 100%   [name] was seen today for follow-up.  Diagnoses and all orders for this visit:  Type 2 diabetes mellitus without complication -     Glucose (CBG) -     Flu Vaccine QUAD 36+ mos PF IM (Fluarix & Fluzone Quad PF) -     Ambulatory referral to Ophthalmology -     Ambulatory referral to Podiatry -     linagliptin (TRADJENTA) 5 MG TABS tablet; Take 1 tablet (5 mg total) by mouth daily. -     metFORMIN (GLUCOPHAGE) 850 MG tablet; Take 1 tablet (850 mg total) by mouth 2 (two) times daily with a meal. Patients diabetes remains uncontrolled as evidence by hemoglobin a1c >8.  Patient has been non-compliant with diet recommendations. Stressed the multiple complications associated with uncontrolled diabetes.  Patient will stay on current medication dose and we will reassess in 3 months.  Essential hypertension -     amLODipine (NORVASC) 10 MG tablet; Take 1 tablet (10 mg total) by mouth daily. -     hydrALAZINE (APRESOLINE) 25 MG tablet; Take 1 tablet (25 mg total) by mouth 3 (three) times daily. -     losartan (COZAAR) 100 MG tablet; Take 1 tablet (100 mg total) by mouth daily. BP elevated in office today because she took medication upon arrival to  clinic. I have stressed compliance with regimen and possible long term effects of kidney damage.  Hyperlipidemia -     atorvastatin (LIPITOR) 20 MG tablet; Take 1 tablet (20 mg total) by mouth daily. Education provided on proper lifestyle changes in order to lower cholesterol. Patient advised to maintain healthy weight and to keep total fat intake at 25-35% of total calories and carbohydrates 50-60% of total daily calories. Explained how high cholesterol places patient at risk for heart disease. Patient placed on appropriate medication and repeat labs in 6 months   Left-sided low back pain without sciatica -     methocarbamol (ROBAXIN) 500 MG tablet;  Take 1 tablet (500 mg total) by mouth 3 (three) times daily. Continue f/u with Ortho  Breast cancer screening, high risk patient -     MM Digital Screening; Future  Estrogen deficiency -     DG Bone Density; Future  Return in about 3 months (around 06/25/2015) for DM/HTN.    Lance Bosch, NP 03/27/2015 3:43 PM

## 2015-03-26 NOTE — Patient Instructions (Signed)
Next week go get your Shingles Vaccine and get in opposite arm as flu shot

## 2015-03-26 NOTE — Progress Notes (Signed)
Patient here for follow up on her diabetes and HTN Patient will need refills but gets some filled at [facility] and the [facility] thru [place]

## 2015-03-30 ENCOUNTER — Ambulatory Visit (INDEPENDENT_AMBULATORY_CARE_PROVIDER_SITE_OTHER): Payer: Medicare HMO | Admitting: Cardiology

## 2015-03-30 ENCOUNTER — Ambulatory Visit (HOSPITAL_COMMUNITY)
Admission: RE | Admit: 2015-03-30 | Discharge: 2015-03-30 | Disposition: A | Discharge status: Home / Self Care | Payer: Medicare HMO | Source: Ambulatory Visit | Attending: Cardiology | Admitting: Cardiology

## 2015-03-30 ENCOUNTER — Encounter: Payer: Self-pay | Admitting: Cardiology

## 2015-03-30 VITALS — BP 140/66 | HR 101 | Ht 67.75 in | Wt 234.2 lb

## 2015-03-30 DIAGNOSIS — I340 Nonrheumatic mitral (valve) insufficiency: Secondary | ICD-10-CM | POA: Diagnosis not present

## 2015-03-30 DIAGNOSIS — I10 Essential (primary) hypertension: Secondary | ICD-10-CM | POA: Insufficient documentation

## 2015-03-30 DIAGNOSIS — D151 Benign neoplasm of heart: Secondary | ICD-10-CM | POA: Insufficient documentation

## 2015-03-30 DIAGNOSIS — E785 Hyperlipidemia, unspecified: Secondary | ICD-10-CM

## 2015-03-30 DIAGNOSIS — E119 Type 2 diabetes mellitus without complications: Secondary | ICD-10-CM | POA: Diagnosis not present

## 2015-03-30 DIAGNOSIS — Z9889 Other specified postprocedural states: Secondary | ICD-10-CM

## 2015-03-30 DIAGNOSIS — I2510 Atherosclerotic heart disease of native coronary artery without angina pectoris: Secondary | ICD-10-CM

## 2015-03-30 DIAGNOSIS — I071 Rheumatic tricuspid insufficiency: Secondary | ICD-10-CM | POA: Insufficient documentation

## 2015-03-30 DIAGNOSIS — I517 Cardiomegaly: Secondary | ICD-10-CM | POA: Insufficient documentation

## 2015-03-30 DIAGNOSIS — I059 Rheumatic mitral valve disease, unspecified: Secondary | ICD-10-CM | POA: Diagnosis present

## 2015-03-30 DIAGNOSIS — I350 Nonrheumatic aortic (valve) stenosis: Secondary | ICD-10-CM | POA: Insufficient documentation

## 2015-03-30 HISTORY — DX: Atherosclerotic heart disease of native coronary artery without angina pectoris: I25.10
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
## 2015-03-30 NOTE — Patient Instructions (Signed)
Medication Instructions:  Your physician recommends that you continue on your current medications as directed. Please refer to the Current Medication list given to you today.   Labwork: None  Testing/Procedures: Your physician has requested that you have an echocardiogram AS SOON AS POSSIBLE. Echocardiography is a painless test that uses sound waves to create images of your heart. It provides your doctor with information about the size and shape of your heart and how well your heart's chambers and valves are working. This procedure takes approximately one hour. There are no restrictions for this procedure.  Your physician has requested that you have a lexiscan myoview AS SOON AS POSSIBLE. For further information please visit [email or web address]. Please follow instruction sheet, as given.  Follow-Up: Your physician wants you to follow-up in: 1 year with Dr. [name]. You will receive a reminder letter in the mail two months in advance. If you don't receive a letter, please call our office to schedule the follow-up appointment.   Any Other Special Instructions Will Be Listed Below (If Applicable).

## 2015-03-30 NOTE — Progress Notes (Signed)
  Echocardiogram 2D Echocardiogram has been performed.  [name] 03/30/2015, 2:03 PM

## 2015-03-30 NOTE — Addendum Note (Signed)
Addended by: Thompson Grayer on: 03/30/2015 10:08 AM   Modules accepted: Orders

## 2015-04-01 ENCOUNTER — Encounter (HOSPITAL_COMMUNITY)
Admission: RE | Admit: 2015-04-01 | Discharge: 2015-04-01 | Disposition: A | Discharge status: Home / Self Care | Payer: Medicare HMO | Source: Ambulatory Visit | Attending: Cardiology | Admitting: Cardiology

## 2015-04-01 ENCOUNTER — Encounter (HOSPITAL_COMMUNITY): Admission: RE | Admit: 2015-04-01 | Payer: Medicare HMO | Source: Ambulatory Visit

## 2015-04-01 ENCOUNTER — Encounter (HOSPITAL_COMMUNITY): Payer: Medicare HMO

## 2015-04-01 DIAGNOSIS — R9439 Abnormal result of other cardiovascular function study: Secondary | ICD-10-CM | POA: Diagnosis not present

## 2015-04-01 DIAGNOSIS — I2510 Atherosclerotic heart disease of native coronary artery without angina pectoris: Secondary | ICD-10-CM

## 2015-04-01 LAB — NM MYOCAR MULTI W/SPECT W/WALL MOTION / EF
CHL CUP MPHR: 155 {beats}/min
CHL CUP NUCLEAR SRS: 5
CHL CUP RESTING HR STRESS: 74 {beats}/min
CSEPEW: 1 METS
Exercise duration (min): 5 min
LV sys vol: 41 mL
LVDIAVOL: 93 mL
NUC STRESS TID: 1.25
Peak HR: 103 {beats}/min
Percent HR: 66 %
RATE: 0.24
SDS: 15
SSS: 20

## 2015-04-01 MED ORDER — REGADENOSON 0.4 MG/5ML IV SOLN
INTRAVENOUS | Status: AC
  Administered 2015-04-01: 0.4 mg via INTRAVENOUS
  Filled 2015-04-01: qty 5

## 2015-04-01 MED ORDER — TECHNETIUM TC 99M SESTAMIBI GENERIC - CARDIOLITE
30.0000 | Freq: Once | INTRAVENOUS | Status: AC | PRN
  Administered 2015-04-01: 30 via INTRAVENOUS

## 2015-04-01 MED ORDER — REGADENOSON 0.4 MG/5ML IV SOLN
0.4000 mg | Freq: Once | INTRAVENOUS | Status: AC
  Administered 2015-04-01: 0.4 mg via INTRAVENOUS

## 2015-04-01 MED ORDER — TECHNETIUM TC 99M SESTAMIBI GENERIC - CARDIOLITE
10.0000 | Freq: Once | INTRAVENOUS | Status: AC | PRN
  Administered 2015-04-01: 10 via INTRAVENOUS

## 2015-04-02 ENCOUNTER — Telehealth: Payer: Self-pay

## 2015-04-02 NOTE — Telephone Encounter (Addendum)
Catheterization scheduled tomorrow, 10/7 at noon with Dr. [place]. Reviewed instructions in great detail with patient and she has no further questions. Patient understands to hold her metformin until 48 hours after cath.    Patient st she is not taking Lozol. Med list updated.

## 2015-04-02 NOTE — Telephone Encounter (Signed)
-----   Message from Sueanne Margarita, MD sent at 04/02/2015  9:54 AM EDT ----- I have just contacted patient and reviewed the results of the study and recommended proceeding with cath.  She is in agreement.  Cardiac catheterization was discussed with the patient fully including risks on myocardial infarction, death, stroke, bleeding, arrhythmia, dye allergy, renal insufficiency or bleeding.  All patient questions and concerns were discussed and the patient understands and is willing to proceed.  She will not need to see PA prior to cath.  Please see if cath can be set up for tomorrow.

## 2015-04-02 NOTE — Addendum Note (Signed)
Addended by: Harland German A on: 04/02/2015 12:26 PM   Modules accepted: Orders, Medications

## 2015-04-03 ENCOUNTER — Encounter (HOSPITAL_COMMUNITY)
Admission: RE | Disposition: A | Discharge status: Home / Self Care | Payer: Self-pay | Source: Ambulatory Visit | Attending: Cardiology

## 2015-04-03 ENCOUNTER — Ambulatory Visit (HOSPITAL_COMMUNITY)
Admission: RE | Admit: 2015-04-03 | Discharge: 2015-04-03 | Disposition: A | Discharge status: Home / Self Care | Payer: Medicare HMO | Source: Ambulatory Visit | Attending: Cardiology | Admitting: Cardiology

## 2015-04-03 DIAGNOSIS — F329 Major depressive disorder, single episode, unspecified: Secondary | ICD-10-CM | POA: Diagnosis not present

## 2015-04-03 DIAGNOSIS — E119 Type 2 diabetes mellitus without complications: Secondary | ICD-10-CM | POA: Diagnosis not present

## 2015-04-03 DIAGNOSIS — R9439 Abnormal result of other cardiovascular function study: Secondary | ICD-10-CM | POA: Diagnosis present

## 2015-04-03 DIAGNOSIS — Z8673 Personal history of transient ischemic attack (TIA), and cerebral infarction without residual deficits: Secondary | ICD-10-CM | POA: Insufficient documentation

## 2015-04-03 DIAGNOSIS — Z8614 Personal history of Methicillin resistant Staphylococcus aureus infection: Secondary | ICD-10-CM | POA: Diagnosis not present

## 2015-04-03 DIAGNOSIS — I6601 Occlusion and stenosis of right middle cerebral artery: Secondary | ICD-10-CM | POA: Diagnosis not present

## 2015-04-03 DIAGNOSIS — G4730 Sleep apnea, unspecified: Secondary | ICD-10-CM | POA: Insufficient documentation

## 2015-04-03 DIAGNOSIS — I2582 Chronic total occlusion of coronary artery: Secondary | ICD-10-CM | POA: Diagnosis not present

## 2015-04-03 DIAGNOSIS — E785 Hyperlipidemia, unspecified: Secondary | ICD-10-CM | POA: Insufficient documentation

## 2015-04-03 DIAGNOSIS — Z7982 Long term (current) use of aspirin: Secondary | ICD-10-CM | POA: Diagnosis not present

## 2015-04-03 DIAGNOSIS — Z7952 Long term (current) use of systemic steroids: Secondary | ICD-10-CM | POA: Diagnosis not present

## 2015-04-03 DIAGNOSIS — I10 Essential (primary) hypertension: Secondary | ICD-10-CM | POA: Insufficient documentation

## 2015-04-03 DIAGNOSIS — D509 Iron deficiency anemia, unspecified: Secondary | ICD-10-CM | POA: Insufficient documentation

## 2015-04-03 DIAGNOSIS — Z9889 Other specified postprocedural states: Secondary | ICD-10-CM

## 2015-04-03 DIAGNOSIS — M1990 Unspecified osteoarthritis, unspecified site: Secondary | ICD-10-CM | POA: Insufficient documentation

## 2015-04-03 DIAGNOSIS — I2510 Atherosclerotic heart disease of native coronary artery without angina pectoris: Secondary | ICD-10-CM | POA: Diagnosis not present

## 2015-04-03 DIAGNOSIS — Z952 Presence of prosthetic heart valve: Secondary | ICD-10-CM | POA: Insufficient documentation

## 2015-04-03 DIAGNOSIS — Z7984 Long term (current) use of oral hypoglycemic drugs: Secondary | ICD-10-CM | POA: Diagnosis not present

## 2015-04-03 DIAGNOSIS — Z87891 Personal history of nicotine dependence: Secondary | ICD-10-CM | POA: Insufficient documentation

## 2015-04-03 DIAGNOSIS — K219 Gastro-esophageal reflux disease without esophagitis: Secondary | ICD-10-CM | POA: Diagnosis not present

## 2015-04-03 DIAGNOSIS — Z951 Presence of aortocoronary bypass graft: Secondary | ICD-10-CM | POA: Diagnosis not present

## 2015-04-03 HISTORY — PX: CARDIAC CATHETERIZATION: SHX172

## 2015-04-03 LAB — BASIC METABOLIC PANEL
Anion gap: 9 (ref 5–15)
BUN: 12 mg/dL (ref 6–20)
CALCIUM: 9.4 mg/dL (ref 8.9–10.3)
CHLORIDE: 105 mmol/L (ref 101–111)
CO2: 27 mmol/L (ref 22–32)
CREATININE: 1.17 mg/dL — AB (ref 0.44–1.00)
GFR calc non Af Amer: 48 mL/min — ABNORMAL LOW (ref >60)
GFR, EST AFRICAN AMERICAN: 55 mL/min — AB (ref >60)
Glucose, Bld: 181 mg/dL — ABNORMAL HIGH (ref 65–99)
Potassium: 4.3 mmol/L (ref 3.5–5.1)
SODIUM: 141 mmol/L (ref 135–145)

## 2015-04-03 LAB — CBC
HEMATOCRIT: 38.3 % (ref 36.0–46.0)
HEMOGLOBIN: 12.2 g/dL (ref 12.0–15.0)
MCH: 26 pg (ref 26.0–34.0)
MCHC: 31.9 g/dL (ref 30.0–36.0)
MCV: 81.7 fL (ref 78.0–100.0)
Platelets: 264 10*3/uL (ref 150–400)
RBC: 4.69 MIL/uL (ref 3.87–5.11)
RDW: 14.3 % (ref 11.5–15.5)
WBC: 4.9 10*3/uL (ref 4.0–10.5)

## 2015-04-03 LAB — GLUCOSE, CAPILLARY
GLUCOSE-CAPILLARY: 179 mg/dL — AB (ref 65–99)
Glucose-Capillary: 137 mg/dL — ABNORMAL HIGH (ref 65–99)

## 2015-04-03 LAB — PROTIME-INR
INR: 0.95 (ref 0.00–1.49)
PROTHROMBIN TIME: 12.8 s (ref 11.6–15.2)

## 2015-04-03 SURGERY — LEFT HEART CATH AND CORS/GRAFTS ANGIOGRAPHY

## 2015-04-03 MED ORDER — FENTANYL CITRATE (PF) 100 MCG/2ML IJ SOLN
INTRAMUSCULAR | Status: AC
  Filled 2015-04-03: qty 4

## 2015-04-03 MED ORDER — SODIUM CHLORIDE 0.9 % IV SOLN: 250.0000 mL | INTRAVENOUS | Status: DC | PRN

## 2015-04-03 MED ORDER — LIDOCAINE HCL (PF) 1 % IJ SOLN
INTRAMUSCULAR | Status: DC | PRN
  Administered 2015-04-03: 12:00:00

## 2015-04-03 MED ORDER — VERAPAMIL HCL 2.5 MG/ML IV SOLN
INTRAVENOUS | Status: DC | PRN
  Administered 2015-04-03: 10 mL via INTRA_ARTERIAL

## 2015-04-03 MED ORDER — HEPARIN SODIUM (PORCINE) 1000 UNIT/ML IJ SOLN
INTRAMUSCULAR | Status: AC
  Filled 2015-04-03: qty 1

## 2015-04-03 MED ORDER — METFORMIN HCL 850 MG PO TABS: 850.0000 mg | ORAL_TABLET | Freq: Two times a day (BID) | ORAL | Status: DC

## 2015-04-03 MED ORDER — SODIUM CHLORIDE 0.9 % WEIGHT BASED INFUSION: 3.0000 mL/kg/h | INTRAVENOUS | Status: AC

## 2015-04-03 MED ORDER — MIDAZOLAM HCL 2 MG/2ML IJ SOLN
INTRAMUSCULAR | Status: DC | PRN
  Administered 2015-04-03 (×2): 1 mg via INTRAVENOUS

## 2015-04-03 MED ORDER — SODIUM CHLORIDE 0.9 % IJ SOLN: 3.0000 mL | INTRAMUSCULAR | Status: DC | PRN

## 2015-04-03 MED ORDER — ASPIRIN 81 MG PO CHEW
81.0000 mg | CHEWABLE_TABLET | ORAL | Status: AC
  Administered 2015-04-03: 81 mg via ORAL

## 2015-04-03 MED ORDER — HYDRALAZINE HCL 25 MG PO TABS: 25.0000 mg | ORAL_TABLET | Freq: Once | ORAL | Status: DC

## 2015-04-03 MED ORDER — ASPIRIN 81 MG PO CHEW
CHEWABLE_TABLET | ORAL | Status: AC
  Filled 2015-04-03: qty 1

## 2015-04-03 MED ORDER — SODIUM CHLORIDE 0.9 % IJ SOLN: 3.0000 mL | Freq: Two times a day (BID) | INTRAMUSCULAR | Status: DC

## 2015-04-03 MED ORDER — LIDOCAINE HCL (PF) 1 % IJ SOLN
INTRAMUSCULAR | Status: AC
  Filled 2015-04-03: qty 30

## 2015-04-03 MED ORDER — VERAPAMIL HCL 2.5 MG/ML IV SOLN
INTRAVENOUS | Status: AC
  Filled 2015-04-03: qty 2

## 2015-04-03 MED ORDER — SODIUM CHLORIDE 0.9 % WEIGHT BASED INFUSION: 1.0000 mL/kg/h | INTRAVENOUS | Status: DC

## 2015-04-03 MED ORDER — MIDAZOLAM HCL 2 MG/2ML IJ SOLN
INTRAMUSCULAR | Status: AC
  Filled 2015-04-03: qty 4

## 2015-04-03 MED ORDER — HEPARIN (PORCINE) IN NACL 2-0.9 UNIT/ML-% IJ SOLN
INTRAMUSCULAR | Status: AC
  Filled 2015-04-03: qty 1000

## 2015-04-03 MED ORDER — IOHEXOL 350 MG/ML SOLN
INTRAVENOUS | Status: DC | PRN
  Administered 2015-04-03: 90 mL via INTRA_ARTERIAL

## 2015-04-03 MED ORDER — FENTANYL CITRATE (PF) 100 MCG/2ML IJ SOLN
INTRAMUSCULAR | Status: DC | PRN
  Administered 2015-04-03 (×2): 25 ug via INTRAVENOUS

## 2015-04-03 MED ORDER — HEPARIN SODIUM (PORCINE) 1000 UNIT/ML IJ SOLN
INTRAMUSCULAR | Status: DC | PRN
  Administered 2015-04-03: 5000 [IU] via INTRAVENOUS

## 2015-04-03 MED ORDER — SODIUM CHLORIDE 0.9 % WEIGHT BASED INFUSION
3.0000 mL/kg/h | INTRAVENOUS | Status: AC
  Administered 2015-04-03: 3 mL/kg/h via INTRAVENOUS

## 2015-04-03 SURGICAL SUPPLY — 13 items
CATH INFINITI 5 FR JL3.5 (CATHETERS) ×4 IMPLANT
CATH INFINITI 5 FR RCB (CATHETERS) ×4 IMPLANT
CATH INFINITI 5FR AL1 (CATHETERS) ×3 IMPLANT
CATH INFINITI 5FR ANG PIGTAIL (CATHETERS) ×4 IMPLANT
CATH INFINITI JR4 5F (CATHETERS) ×4 IMPLANT
DEVICE RAD COMP TR BAND LRG (VASCULAR PRODUCTS) ×4 IMPLANT
GLIDESHEATH SLEND SS 6F .021 (SHEATH) ×4 IMPLANT
KIT HEART LEFT (KITS) ×4 IMPLANT
PACK CARDIAC CATHETERIZATION (CUSTOM PROCEDURE TRAY) ×4 IMPLANT
SYR MEDRAD MARK V 150ML (SYRINGE) ×4 IMPLANT
TRANSDUCER W/STOPCOCK (MISCELLANEOUS) ×4 IMPLANT
TUBING CIL FLEX 10 FLL-RA (TUBING) ×4 IMPLANT
WIRE SAFE-T 1.5MM-J .035X260CM (WIRE) ×4 IMPLANT
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
## 2015-04-03 NOTE — Discharge Instructions (Signed)
Radial Site Care °Refer to this sheet in the next few weeks. These instructions provide you with information about caring for yourself after your procedure. Your health care provider may also give you more specific instructions. Your treatment has been planned according to current medical practices, but problems sometimes occur. Call your health care provider if you have any problems or questions after your procedure. °WHAT TO EXPECT AFTER THE PROCEDURE °After your procedure, it is typical to have the following: °· Bruising at the radial site that usually fades within 1-2 weeks. °· Blood collecting in the tissue (hematoma) that may be painful to the touch. It should usually decrease in size and tenderness within 1-2 weeks. °HOME CARE INSTRUCTIONS °· Take medicines only as directed by your health care provider. °· You may shower 24-48 hours after the procedure or as directed by your health care provider. Remove the bandage (dressing) and gently wash the site with plain soap and water. Pat the area dry with a clean towel. Do not rub the site, because this may cause bleeding. °· Do not take baths, swim, or use a hot tub until your health care provider approves. °· Check your insertion site every day for redness, swelling, or drainage. °· Do not apply powder or lotion to the site. °· Do not flex or bend the affected arm for 24 hours or as directed by your health care provider. °· Do not push or pull heavy objects with the affected arm for 24 hours or as directed by your health care provider. °· Do not lift over 10 lb (4.5 kg) for 5 days after your procedure or as directed by your health care provider. °· Ask your health care provider when it is okay to: °¨ Return to work or school. °¨ Resume usual physical activities or sports. °¨ Resume sexual activity. °· Do not drive home if you are discharged the same day as the procedure. Have someone else drive you. °· You may drive 24 hours after the procedure unless otherwise  instructed by your health care provider. °· Do not operate machinery or power tools for 24 hours after the procedure. °· If your procedure was done as an outpatient procedure, which means that you went home the same day as your procedure, a responsible adult should be with you for the first 24 hours after you arrive home. °· Keep all follow-up visits as directed by your health care provider. This is important. °SEEK MEDICAL CARE IF: °· You have a fever. °· You have chills. °· You have increased bleeding from the radial site. Hold pressure on the site. °SEEK IMMEDIATE MEDICAL CARE IF: °· You have unusual pain at the radial site. °· You have redness, warmth, or swelling at the radial site. °· You have drainage (other than a small amount of blood on the dressing) from the radial site. °· The radial site is bleeding, and the bleeding does not stop after 30 minutes of holding steady pressure on the site. °· Your arm or hand becomes pale, cool, tingly, or numb. °  °This information is not intended to replace advice given to you by your health care provider. Make sure you discuss any questions you have with your health care provider. °  °Document Released: 07/16/2010 Document Revised: 07/04/2014 Document Reviewed: 12/30/2013 °Elsevier Interactive Patient Education ©2016 [employer] ° °

## 2015-04-03 NOTE — Interval H&P Note (Signed)
History and Physical Interval Note:  04/03/2015 11:21 AM  [name]  has presented today for surgery, with the diagnosis of abnormal stress  The various methods of treatment have been discussed with the patient and family. After consideration of risks, benefits and other options for treatment, the patient has consented to  Procedure(s): Left Heart Cath and Coronary Angiography (N/A) as a surgical intervention .  The patient's history has been reviewed, patient examined, no change in status, stable for surgery.  I have reviewed the patient's chart and labs.  Questions were answered to the patient's satisfaction.   Cath Lab Visit (complete for each Cath Lab visit)  Clinical Evaluation Leading to the Procedure:   ACS: No.  Non-ACS:    Anginal Classification: CCS I  Anti-ischemic medical therapy: Minimal Therapy (1 class of medications)  Non-Invasive Test Results: Intermediate-risk stress test findings: cardiac mortality 1-3%/year  Prior CABG: Previous CABG        [name] 04/03/2015 11:21 AM

## 2015-04-06 ENCOUNTER — Telehealth: Payer: Self-pay | Admitting: Cardiology

## 2015-04-06 ENCOUNTER — Encounter (HOSPITAL_COMMUNITY): Payer: Self-pay | Admitting: Cardiology

## 2015-04-06 NOTE — Telephone Encounter (Signed)
New message      Request for surgical clearance:  What type of surgery is being performed? back surgery 1. When is this surgery scheduled? Pending clearance  2. Are there any medications that need to be held prior to surgery and how long? Is pt cleared for back surgery?  3. Name of physician performing surgery? Dr Arnoldo Morale  4. What is your office phone and fax number? Fax [phone]

## 2015-04-06 NOTE — Telephone Encounter (Signed)
Low risk from cardiac standpoint for surgery.

## 2015-04-06 NOTE — Telephone Encounter (Signed)
Please advise 

## 2015-04-07 ENCOUNTER — Other Ambulatory Visit: Payer: Self-pay | Admitting: Neurosurgery

## 2015-04-07 NOTE — Telephone Encounter (Signed)
Printed and placed in MR "to be faxed" bin.

## 2015-04-09 ENCOUNTER — Other Ambulatory Visit: Payer: Medicare HMO

## 2015-04-09 ENCOUNTER — Ambulatory Visit: Payer: Medicare HMO

## 2015-04-14 ENCOUNTER — Encounter (HOSPITAL_COMMUNITY): Payer: Self-pay | Admitting: *Deleted

## 2015-04-14 NOTE — Progress Notes (Signed)
Anesthesia Chart Review: SAME DAY WORK-UP.  Patient is a 64 year old female scheduled for left L2-3 microdiskectomy on 04/15/15 by Dr. Arnoldo Morale.  History includes former smoker, CAD with papillary fibroelastoma of MV s/p CABG (SVG to D2, SVG to PDA) with resection of fibroelastoma and MV repair 06/2010, HTN, GERD, depression, HLD, MRSA, CVA with work-up revealing distal R MCA stenosis but with preserved distal flow and no major bracnch occlusion and MV fibroelastoma 6/2011s/p resection 06/2010, OSA, DM2, PNA. BMI is consistent with obesity. PCP is Chari Manning, NP.  Cardiologist is Dr. Fransico Him who felt patient was low risk from cardiac standpoint for surgery (see telephone encounter 04/06/15).  04/01/15 Nuclear stress test:  There was no ST segment deviation noted during stress.  Findings consistent with ischemia.  This is an intermediate risk study.  The left ventricular ejection fraction is normal (55-65%). 1. Medium-sized, reversible mid to apical inferior and inferolateral perfusion defect. This is suggestive of ischemia. 2. Normal EF with mild inferior hypokinesis.   Subsequently, patient had LHC on 04/03/15 that showed:   Mid RCA lesion, 95% stenosed.  Mid RCA to Dist RCA lesion, 100% stenosed.  Ost Ramus to Ramus lesion, 95% stenosed. This is a tiny branch.  2nd Diag lesion, 100% stenosed.  Prox Cx to Mid Cx lesion, 30% stenosed.  SVG to PDA was injected is normal in caliber, and is anatomically normal.  SVG to second diagonal was injected is normal in caliber, and is anatomically normal.  The left ventricular systolic function is normal. 1. Severe 2 vessel obstructive CAD. The ramus branch is tiny.  2. Patent SVG to PDA 3. Patent SVG to second diagonal 4. Normal LV function 5. No MR Plan: continue medical therapy. She should be low risk for back surgery.  03/30/15 Echo: Study Conclusions - Left ventricle: The cavity size was normal. There was severe  concentric hypertrophy. Systolic function was normal. Theestimated ejection fraction was in the range of 60% to 65%. Wall motion was normal; there were no regional wall motionabnormalities. Doppler parameters are consistent with abnormal left ventricular relaxation (grade 1 diastolic dysfunction). Doppler parameters are consistent with intermediate ventricular end-diastolic filling pressure. - Aortic valve: Trileaflet; normal thickness leaflets. There was mild stenosis. Valve area (VTI): 2.35 cm^2. Valve area (Vmax): 2.31 cm^2. Valve area (Vmean): 2.05 cm^2. - Aortic root: The aortic root was normal in size. - Mitral valve: S/P mitral annulus repair. Mildly thickened leaflets. Mean gradient (D): 5 mm Hg. Valve area by continuity equation (using LVOT flow): 3.04 cm^2. - Right ventricle: The cavity size was normal. Wall thickness was normal. Systolic function was normal. - Right atrium: The atrium was normal in size. - Tricuspid valve: There was mild regurgitation. - Pulmonic valve: There was no regurgitation. - Pulmonary arteries: Systolic pressure was within the normalrange. - Inferior vena cava: The vessel was normal in size. - Pericardium, extracardiac: There was no pericardial effusion. Impressions: There is severe LVH with maximum LVOT gradient 16 mmHg. Gradients across the mitral valve are mildly elevated.  03/30/15 EKG: ST at 101 bpm. Negative T waves in high lateral leads.  Labs (CBC, BMET, PT/INR) from 04/03/15 noted. Glucose 181.  She had recent LHC with medical treatment recommended. If no acute changes then I would anticipate that she could proceed as planned.  George Hugh [facility] Short Stay [facility]/Anesthesiology Phone [phone] 04/14/2015 1:03 PM

## 2015-04-15 ENCOUNTER — Ambulatory Visit (HOSPITAL_COMMUNITY): Payer: Medicare HMO | Admitting: Vascular Surgery

## 2015-04-15 ENCOUNTER — Ambulatory Visit (HOSPITAL_COMMUNITY)
Admission: RE | Admit: 2015-04-15 | Discharge: 2015-04-16 | Disposition: A | Discharge status: Home / Self Care | Payer: Medicare HMO | Source: Ambulatory Visit | Attending: Neurosurgery | Admitting: Neurosurgery

## 2015-04-15 ENCOUNTER — Encounter (HOSPITAL_COMMUNITY)
Admission: RE | Disposition: A | Discharge status: Home / Self Care | Payer: Self-pay | Source: Ambulatory Visit | Attending: Neurosurgery

## 2015-04-15 ENCOUNTER — Ambulatory Visit (HOSPITAL_COMMUNITY): Payer: Medicare HMO

## 2015-04-15 DIAGNOSIS — I10 Essential (primary) hypertension: Secondary | ICD-10-CM | POA: Diagnosis not present

## 2015-04-15 DIAGNOSIS — J449 Chronic obstructive pulmonary disease, unspecified: Secondary | ICD-10-CM | POA: Diagnosis not present

## 2015-04-15 DIAGNOSIS — Z8614 Personal history of Methicillin resistant Staphylococcus aureus infection: Secondary | ICD-10-CM | POA: Insufficient documentation

## 2015-04-15 DIAGNOSIS — Z6836 Body mass index (BMI) 36.0-36.9, adult: Secondary | ICD-10-CM | POA: Insufficient documentation

## 2015-04-15 DIAGNOSIS — E119 Type 2 diabetes mellitus without complications: Secondary | ICD-10-CM | POA: Diagnosis not present

## 2015-04-15 DIAGNOSIS — E785 Hyperlipidemia, unspecified: Secondary | ICD-10-CM | POA: Diagnosis not present

## 2015-04-15 DIAGNOSIS — I2510 Atherosclerotic heart disease of native coronary artery without angina pectoris: Secondary | ICD-10-CM | POA: Insufficient documentation

## 2015-04-15 DIAGNOSIS — Z7982 Long term (current) use of aspirin: Secondary | ICD-10-CM | POA: Diagnosis not present

## 2015-04-15 DIAGNOSIS — Z955 Presence of coronary angioplasty implant and graft: Secondary | ICD-10-CM | POA: Diagnosis not present

## 2015-04-15 DIAGNOSIS — K219 Gastro-esophageal reflux disease without esophagitis: Secondary | ICD-10-CM | POA: Diagnosis not present

## 2015-04-15 DIAGNOSIS — Z8673 Personal history of transient ischemic attack (TIA), and cerebral infarction without residual deficits: Secondary | ICD-10-CM | POA: Insufficient documentation

## 2015-04-15 DIAGNOSIS — M549 Dorsalgia, unspecified: Secondary | ICD-10-CM

## 2015-04-15 DIAGNOSIS — Z87891 Personal history of nicotine dependence: Secondary | ICD-10-CM | POA: Insufficient documentation

## 2015-04-15 DIAGNOSIS — Z7984 Long term (current) use of oral hypoglycemic drugs: Secondary | ICD-10-CM | POA: Diagnosis not present

## 2015-04-15 DIAGNOSIS — M5126 Other intervertebral disc displacement, lumbar region: Secondary | ICD-10-CM | POA: Diagnosis present

## 2015-04-15 DIAGNOSIS — M5116 Intervertebral disc disorders with radiculopathy, lumbar region: Secondary | ICD-10-CM | POA: Diagnosis not present

## 2015-04-15 HISTORY — DX: Constipation, unspecified: K59.00

## 2015-04-15 HISTORY — PX: LUMBAR LAMINECTOMY/DECOMPRESSION MICRODISCECTOMY: SHX5026

## 2015-04-15 LAB — GLUCOSE, CAPILLARY
GLUCOSE-CAPILLARY: 180 mg/dL — AB (ref 65–99)
GLUCOSE-CAPILLARY: 140 mg/dL — AB (ref 65–99)
GLUCOSE-CAPILLARY: 198 mg/dL — AB (ref 65–99)
GLUCOSE-CAPILLARY: 266 mg/dL — AB (ref 65–99)
Glucose-Capillary: 145 mg/dL — ABNORMAL HIGH (ref 65–99)

## 2015-04-15 LAB — CBC
HEMATOCRIT: 37.2 % (ref 36.0–46.0)
HEMOGLOBIN: 11.7 g/dL — AB (ref 12.0–15.0)
MCH: 25.4 pg — AB (ref 26.0–34.0)
MCHC: 31.5 g/dL (ref 30.0–36.0)
MCV: 80.9 fL (ref 78.0–100.0)
Platelets: 248 10*3/uL (ref 150–400)
RBC: 4.6 MIL/uL (ref 3.87–5.11)
RDW: 14.7 % (ref 11.5–15.5)
WBC: 7.9 10*3/uL (ref 4.0–10.5)

## 2015-04-15 LAB — BASIC METABOLIC PANEL
ANION GAP: 11 (ref 5–15)
BUN: 14 mg/dL (ref 6–20)
CALCIUM: 9.5 mg/dL (ref 8.9–10.3)
CO2: 23 mmol/L (ref 22–32)
Chloride: 104 mmol/L (ref 101–111)
Creatinine, Ser: 1.14 mg/dL — ABNORMAL HIGH (ref 0.44–1.00)
GFR, EST AFRICAN AMERICAN: 57 mL/min — AB (ref >60)
GFR, EST NON AFRICAN AMERICAN: 49 mL/min — AB (ref >60)
Glucose, Bld: 183 mg/dL — ABNORMAL HIGH (ref 65–99)
Potassium: 4.2 mmol/L (ref 3.5–5.1)
SODIUM: 138 mmol/L (ref 135–145)

## 2015-04-15 LAB — SURGICAL PCR SCREEN
MRSA, PCR: NEGATIVE
Staphylococcus aureus: POSITIVE — AB

## 2015-04-15 SURGERY — LUMBAR LAMINECTOMY/DECOMPRESSION MICRODISCECTOMY 1 LEVEL
Anesthesia: General | Site: Back | Laterality: Left

## 2015-04-15 MED ORDER — DOCUSATE SODIUM 100 MG PO CAPS
100.0000 mg | ORAL_CAPSULE | Freq: Two times a day (BID) | ORAL | Status: DC
  Administered 2015-04-15 – 2015-04-16 (×3): 100 mg via ORAL
  Filled 2015-04-15 (×3): qty 1

## 2015-04-15 MED ORDER — SODIUM CHLORIDE 0.9 % IR SOLN
Status: DC | PRN
  Administered 2015-04-15: 500 mL

## 2015-04-15 MED ORDER — LINAGLIPTIN 5 MG PO TABS
5.0000 mg | ORAL_TABLET | Freq: Every day | ORAL | Status: DC
  Administered 2015-04-15 – 2015-04-16 (×2): 5 mg via ORAL
  Filled 2015-04-15 (×2): qty 1

## 2015-04-15 MED ORDER — DIAZEPAM 5 MG PO TABS
ORAL_TABLET | ORAL | Status: AC
  Filled 2015-04-15: qty 1

## 2015-04-15 MED ORDER — BUPIVACAINE-EPINEPHRINE (PF) 0.5% -1:200000 IJ SOLN
INTRAMUSCULAR | Status: DC | PRN
  Administered 2015-04-15: 20 mL via PERINEURAL

## 2015-04-15 MED ORDER — AMLODIPINE BESYLATE 10 MG PO TABS
10.0000 mg | ORAL_TABLET | Freq: Every day | ORAL | Status: DC
  Administered 2015-04-16: 10 mg via ORAL
  Filled 2015-04-15: qty 1

## 2015-04-15 MED ORDER — HYDROCODONE-ACETAMINOPHEN 5-325 MG PO TABS: 1.0000 | ORAL_TABLET | ORAL | Status: DC | PRN

## 2015-04-15 MED ORDER — OXYCODONE HCL 5 MG PO TABS: 5.0000 mg | ORAL_TABLET | Freq: Once | ORAL | Status: DC | PRN

## 2015-04-15 MED ORDER — ONDANSETRON HCL 4 MG/2ML IJ SOLN
INTRAMUSCULAR | Status: DC | PRN
  Administered 2015-04-15: 4 mg via INTRAVENOUS

## 2015-04-15 MED ORDER — LOSARTAN POTASSIUM 50 MG PO TABS
100.0000 mg | ORAL_TABLET | Freq: Every day | ORAL | Status: DC
  Administered 2015-04-15 – 2015-04-16 (×2): 100 mg via ORAL
  Filled 2015-04-15 (×2): qty 2

## 2015-04-15 MED ORDER — HYDRALAZINE HCL 25 MG PO TABS
25.0000 mg | ORAL_TABLET | Freq: Three times a day (TID) | ORAL | Status: DC
Start: 2015-04-15 — End: 2015-04-16
  Administered 2015-04-15 – 2015-04-16 (×2): 25 mg via ORAL
  Filled 2015-04-15 (×4): qty 1

## 2015-04-15 MED ORDER — INSULIN ASPART 100 UNIT/ML ~~LOC~~ SOLN
0.0000 [IU] | Freq: Three times a day (TID) | SUBCUTANEOUS | Status: DC
  Administered 2015-04-16 (×2): 3 [IU] via SUBCUTANEOUS

## 2015-04-15 MED ORDER — LACTATED RINGERS IV SOLN: INTRAVENOUS | Status: DC

## 2015-04-15 MED ORDER — HEMOSTATIC AGENTS (NO CHARGE) OPTIME
TOPICAL | Status: DC | PRN
  Administered 2015-04-15: 1 via TOPICAL

## 2015-04-15 MED ORDER — NEOSTIGMINE METHYLSULFATE 10 MG/10ML IV SOLN
INTRAVENOUS | Status: AC
  Filled 2015-04-15: qty 1

## 2015-04-15 MED ORDER — CEFAZOLIN SODIUM 1-5 GM-% IV SOLN
1.0000 g | Freq: Three times a day (TID) | INTRAVENOUS | Status: AC
  Administered 2015-04-15 – 2015-04-16 (×2): 1 g via INTRAVENOUS
  Filled 2015-04-15 (×2): qty 50

## 2015-04-15 MED ORDER — PROMETHAZINE HCL 25 MG/ML IJ SOLN: 6.2500 mg | INTRAMUSCULAR | Status: DC | PRN

## 2015-04-15 MED ORDER — IBUPROFEN 200 MG PO TABS: 200.0000 mg | ORAL_TABLET | ORAL | Status: DC | PRN

## 2015-04-15 MED ORDER — MIDAZOLAM HCL 2 MG/2ML IJ SOLN
INTRAMUSCULAR | Status: AC
  Filled 2015-04-15: qty 4

## 2015-04-15 MED ORDER — ACETAMINOPHEN 325 MG PO TABS: 650.0000 mg | ORAL_TABLET | ORAL | Status: DC | PRN

## 2015-04-15 MED ORDER — ALBUTEROL SULFATE (2.5 MG/3ML) 0.083% IN NEBU: 2.5000 mg | INHALATION_SOLUTION | Freq: Four times a day (QID) | RESPIRATORY_TRACT | Status: DC | PRN

## 2015-04-15 MED ORDER — CEFAZOLIN SODIUM-DEXTROSE 2-3 GM-% IV SOLR
2.0000 g | INTRAVENOUS | Status: AC
  Administered 2015-04-15: 2 g via INTRAVENOUS
  Filled 2015-04-15: qty 50

## 2015-04-15 MED ORDER — ONDANSETRON 4 MG PO TBDP
4.0000 mg | ORAL_TABLET | Freq: Three times a day (TID) | ORAL | Status: DC | PRN
  Filled 2015-04-15: qty 1

## 2015-04-15 MED ORDER — MIDAZOLAM HCL 5 MG/5ML IJ SOLN
INTRAMUSCULAR | Status: DC | PRN
  Administered 2015-04-15: 2 mg via INTRAVENOUS

## 2015-04-15 MED ORDER — LIDOCAINE HCL (CARDIAC) 20 MG/ML IV SOLN
INTRAVENOUS | Status: AC
  Filled 2015-04-15: qty 5

## 2015-04-15 MED ORDER — OXYCODONE-ACETAMINOPHEN 10-325 MG PO TABS: 1.0000 | ORAL_TABLET | ORAL | Status: DC | PRN

## 2015-04-15 MED ORDER — MORPHINE SULFATE (PF) 2 MG/ML IV SOLN: 1.0000 mg | INTRAVENOUS | Status: DC | PRN

## 2015-04-15 MED ORDER — LACTATED RINGERS IV SOLN
INTRAVENOUS | Status: DC | PRN
  Administered 2015-04-15 (×2): via INTRAVENOUS

## 2015-04-15 MED ORDER — OXYCODONE HCL 5 MG/5ML PO SOLN: 5.0000 mg | Freq: Once | ORAL | Status: DC | PRN

## 2015-04-15 MED ORDER — HYDROMORPHONE HCL 1 MG/ML IJ SOLN
0.2500 mg | INTRAMUSCULAR | Status: DC | PRN
  Administered 2015-04-15 (×4): 0.5 mg via INTRAVENOUS

## 2015-04-15 MED ORDER — BISACODYL 10 MG RE SUPP: 10.0000 mg | Freq: Every day | RECTAL | Status: DC | PRN

## 2015-04-15 MED ORDER — MUPIROCIN 2 % EX OINT
1.0000 "application " | TOPICAL_OINTMENT | Freq: Once | CUTANEOUS | Status: AC
  Administered 2015-04-15: 1 via TOPICAL
  Filled 2015-04-15: qty 22

## 2015-04-15 MED ORDER — SUCCINYLCHOLINE CHLORIDE 20 MG/ML IJ SOLN
INTRAMUSCULAR | Status: AC
  Filled 2015-04-15: qty 1

## 2015-04-15 MED ORDER — MENTHOL 3 MG MT LOZG: 1.0000 | LOZENGE | OROMUCOSAL | Status: DC | PRN

## 2015-04-15 MED ORDER — ACETAMINOPHEN 650 MG RE SUPP: 650.0000 mg | RECTAL | Status: DC | PRN

## 2015-04-15 MED ORDER — PROPOFOL 10 MG/ML IV BOLUS
INTRAVENOUS | Status: DC | PRN
  Administered 2015-04-15: 200 mg via INTRAVENOUS

## 2015-04-15 MED ORDER — NEOSTIGMINE METHYLSULFATE 10 MG/10ML IV SOLN
INTRAVENOUS | Status: DC | PRN
  Administered 2015-04-15: 4 mg via INTRAVENOUS

## 2015-04-15 MED ORDER — HYDROMORPHONE HCL 1 MG/ML IJ SOLN
INTRAMUSCULAR | Status: AC
  Filled 2015-04-15: qty 1

## 2015-04-15 MED ORDER — OXYCODONE-ACETAMINOPHEN 5-325 MG PO TABS
1.0000 | ORAL_TABLET | ORAL | Status: DC | PRN
  Administered 2015-04-15 – 2015-04-16 (×4): 2 via ORAL
  Filled 2015-04-15 (×4): qty 2

## 2015-04-15 MED ORDER — GLYCOPYRROLATE 0.2 MG/ML IJ SOLN
INTRAMUSCULAR | Status: AC
  Filled 2015-04-15: qty 3

## 2015-04-15 MED ORDER — LACTATED RINGERS IV SOLN
INTRAVENOUS | Status: DC
  Administered 2015-04-15: 50 mL/h via INTRAVENOUS

## 2015-04-15 MED ORDER — ATORVASTATIN CALCIUM 20 MG PO TABS
20.0000 mg | ORAL_TABLET | Freq: Every day | ORAL | Status: DC
  Administered 2015-04-15 – 2015-04-16 (×2): 20 mg via ORAL
  Filled 2015-04-15 (×2): qty 1

## 2015-04-15 MED ORDER — ALUM & MAG HYDROXIDE-SIMETH 200-200-20 MG/5ML PO SUSP: 30.0000 mL | Freq: Four times a day (QID) | ORAL | Status: DC | PRN

## 2015-04-15 MED ORDER — METOCLOPRAMIDE HCL 10 MG PO TABS
10.0000 mg | ORAL_TABLET | Freq: Three times a day (TID) | ORAL | Status: DC
  Administered 2015-04-15 – 2015-04-16 (×2): 10 mg via ORAL
  Filled 2015-04-15 (×2): qty 1

## 2015-04-15 MED ORDER — ONDANSETRON HCL 4 MG/2ML IJ SOLN
INTRAMUSCULAR | Status: AC
Start: 2015-04-15 — End: 2015-04-15
  Filled 2015-04-15: qty 2

## 2015-04-15 MED ORDER — LIDOCAINE HCL (CARDIAC) 20 MG/ML IV SOLN
INTRAVENOUS | Status: DC | PRN
Start: 2015-04-15 — End: 2015-04-15
  Administered 2015-04-15: 40 mg via INTRAVENOUS
  Administered 2015-04-15: 60 mg via INTRAVENOUS

## 2015-04-15 MED ORDER — ROCURONIUM BROMIDE 50 MG/5ML IV SOLN
INTRAVENOUS | Status: AC
  Filled 2015-04-15: qty 1

## 2015-04-15 MED ORDER — ONDANSETRON HCL 4 MG/2ML IJ SOLN: 4.0000 mg | INTRAMUSCULAR | Status: DC | PRN

## 2015-04-15 MED ORDER — FENTANYL CITRATE (PF) 100 MCG/2ML IJ SOLN
INTRAMUSCULAR | Status: DC | PRN
  Administered 2015-04-15: 150 ug via INTRAVENOUS
  Administered 2015-04-15 (×2): 50 ug via INTRAVENOUS

## 2015-04-15 MED ORDER — PHENOL 1.4 % MT LIQD: 1.0000 | OROMUCOSAL | Status: DC | PRN

## 2015-04-15 MED ORDER — PANTOPRAZOLE SODIUM 40 MG PO TBEC
40.0000 mg | DELAYED_RELEASE_TABLET | Freq: Every day | ORAL | Status: DC
  Administered 2015-04-16: 40 mg via ORAL

## 2015-04-15 MED ORDER — FENTANYL CITRATE (PF) 250 MCG/5ML IJ SOLN
INTRAMUSCULAR | Status: AC
  Filled 2015-04-15: qty 5

## 2015-04-15 MED ORDER — 0.9 % SODIUM CHLORIDE (POUR BTL) OPTIME
TOPICAL | Status: DC | PRN
  Administered 2015-04-15: 1000 mL

## 2015-04-15 MED ORDER — THROMBIN 5000 UNITS EX SOLR
CUTANEOUS | Status: DC | PRN
  Administered 2015-04-15 (×2): 5000 [IU] via TOPICAL

## 2015-04-15 MED ORDER — GLYCOPYRROLATE 0.2 MG/ML IJ SOLN
INTRAMUSCULAR | Status: DC | PRN
  Administered 2015-04-15: .7 mg via INTRAVENOUS

## 2015-04-15 MED ORDER — INSULIN ASPART 100 UNIT/ML ~~LOC~~ SOLN
0.0000 [IU] | Freq: Every day | SUBCUTANEOUS | Status: DC
  Administered 2015-04-15: 3 [IU] via SUBCUTANEOUS

## 2015-04-15 MED ORDER — ROCURONIUM BROMIDE 100 MG/10ML IV SOLN
INTRAVENOUS | Status: DC | PRN
  Administered 2015-04-15: 50 mg via INTRAVENOUS

## 2015-04-15 MED ORDER — DIAZEPAM 5 MG PO TABS
5.0000 mg | ORAL_TABLET | Freq: Four times a day (QID) | ORAL | Status: DC | PRN
  Administered 2015-04-15 (×2): 5 mg via ORAL
  Filled 2015-04-15: qty 1

## 2015-04-15 SURGICAL SUPPLY — 50 items
APL SKNCLS STERI-STRIP NONHPOA (GAUZE/BANDAGES/DRESSINGS) ×1
BAG DECANTER FOR FLEXI CONT (MISCELLANEOUS) ×3 IMPLANT
BENZOIN TINCTURE PRP APPL 2/3 (GAUZE/BANDAGES/DRESSINGS) ×3 IMPLANT
BLADE CLIPPER SURG (BLADE) IMPLANT
BRUSH SCRUB EZ PLAIN DRY (MISCELLANEOUS) ×3 IMPLANT
BUR MATCHSTICK NEURO 3.0 LAGG (BURR) ×3 IMPLANT
BUR PRECISION FLUTE 6.0 (BURR) ×3 IMPLANT
CANISTER SUCT 3000ML PPV (MISCELLANEOUS) ×3 IMPLANT
CLOSURE WOUND 1/2 X4 (GAUZE/BANDAGES/DRESSINGS) ×1
DRAPE LAPAROTOMY 100X72X124 (DRAPES) ×3 IMPLANT
DRAPE MICROSCOPE LEICA (MISCELLANEOUS) ×3 IMPLANT
DRAPE POUCH INSTRU U-SHP 10X18 (DRAPES) ×3 IMPLANT
DRAPE SURG 17X23 STRL (DRAPES) ×12 IMPLANT
ELECT BLADE 4.0 EZ CLEAN MEGAD (MISCELLANEOUS) ×3
ELECT REM PT RETURN 9FT ADLT (ELECTROSURGICAL) ×3
ELECTRODE BLDE 4.0 EZ CLN MEGD (MISCELLANEOUS) ×1 IMPLANT
ELECTRODE REM PT RTRN 9FT ADLT (ELECTROSURGICAL) ×1 IMPLANT
GAUZE SPONGE 4X4 12PLY STRL (GAUZE/BANDAGES/DRESSINGS) ×3 IMPLANT
GAUZE SPONGE 4X4 16PLY XRAY LF (GAUZE/BANDAGES/DRESSINGS) IMPLANT
GLOVE BIO SURGEON STRL SZ8 (GLOVE) ×3 IMPLANT
GLOVE BIO SURGEON STRL SZ8.5 (GLOVE) ×3 IMPLANT
GLOVE EXAM NITRILE LRG STRL (GLOVE) IMPLANT
GLOVE EXAM NITRILE MD LF STRL (GLOVE) IMPLANT
GLOVE EXAM NITRILE XL STR (GLOVE) IMPLANT
GLOVE EXAM NITRILE XS STR PU (GLOVE) IMPLANT
GOWN STRL REUS W/ TWL LRG LVL3 (GOWN DISPOSABLE) IMPLANT
GOWN STRL REUS W/ TWL XL LVL3 (GOWN DISPOSABLE) ×1 IMPLANT
GOWN STRL REUS W/TWL 2XL LVL3 (GOWN DISPOSABLE) IMPLANT
GOWN STRL REUS W/TWL LRG LVL3 (GOWN DISPOSABLE)
GOWN STRL REUS W/TWL XL LVL3 (GOWN DISPOSABLE) ×3
KIT BASIN OR (CUSTOM PROCEDURE TRAY) ×3 IMPLANT
KIT ROOM TURNOVER OR (KITS) ×3 IMPLANT
NDL HYPO 21X1.5 SAFETY (NEEDLE) IMPLANT
NEEDLE HYPO 21X1.5 SAFETY (NEEDLE) IMPLANT
NEEDLE HYPO 22GX1.5 SAFETY (NEEDLE) ×3 IMPLANT
NS IRRIG 1000ML POUR BTL (IV SOLUTION) ×3 IMPLANT
PACK LAMINECTOMY NEURO (CUSTOM PROCEDURE TRAY) ×3 IMPLANT
PAD ARMBOARD 7.5X6 YLW CONV (MISCELLANEOUS) ×9 IMPLANT
PATTIES SURGICAL .5 X1 (DISPOSABLE) IMPLANT
RUBBERBAND STERILE (MISCELLANEOUS) ×6 IMPLANT
SPONGE SURGIFOAM ABS GEL SZ50 (HEMOSTASIS) ×3 IMPLANT
STRIP CLOSURE SKIN 1/2X4 (GAUZE/BANDAGES/DRESSINGS) ×2 IMPLANT
SUT VIC AB 1 CT1 18XBRD ANBCTR (SUTURE) ×1 IMPLANT
SUT VIC AB 1 CT1 8-18 (SUTURE) ×3
SUT VIC AB 2-0 CP2 18 (SUTURE) ×3 IMPLANT
TAPE CLOTH SURG 4X10 WHT LF (GAUZE/BANDAGES/DRESSINGS) ×2 IMPLANT
TAPE STRIPS DRAPE STRL (GAUZE/BANDAGES/DRESSINGS) ×2 IMPLANT
TOWEL OR 17X24 6PK STRL BLUE (TOWEL DISPOSABLE) ×3 IMPLANT
TOWEL OR 17X26 10 PK STRL BLUE (TOWEL DISPOSABLE) ×3 IMPLANT
WATER STERILE IRR 1000ML POUR (IV SOLUTION) ×3 IMPLANT

## 2015-04-15 NOTE — Op Note (Signed)
Brief history: The patient is a 64 year old black female who has complained of back and left leg pain consistent with a lumbar radiculopathy. She has failed medical management and was worked up with a lumbar MRI. This demonstrated a far lateral herniated discs at L2-3 on the left. I discussed the situation with the patient. She has weighed the risks, benefits, and alternative surgery and decided proceed with a left L2-3 discectomy.  Preoperative diagnosis: Left L2-3 herniated disc, lumbar radiculopathy, lumbago  Postoperative diagnosis: The same  Procedure: Left L2-3 far lateral Intervertebral discectomy using micro-dissection  Surgeon: Dr. Earle Gell  Asst.: Dr. Dayton Bailiff  Anesthesia: Gen. endotracheal  Estimated blood loss: Minimal  Drains: None  Complications: None  Description of procedure: The patient was brought to the operating room by the anesthesia team. General endotracheal anesthesia was induced. The patient was turned to the prone position on the Wilson frame. The patient's lumbosacral region was then prepared with Betadine scrub and Betadine solution. Sterile drapes were applied.  I then injected the area to be incised with Marcaine with epinephrine solution. I then used a scalpel to make a linear midline incision over the L2-3 intervertebral disc space. I then used electrocautery to perform a left sided subperiosteal dissection exposing the spinous process and lamina of L2 and L3. We obtained intraoperative radiograph to confirm our location. I then inserted the [facility] retractor for exposure.  We then brought the operative microscope into the field. Under its magnification and illumination we completed the microdissection. I used a high-speed drill to drill off the lateral aspect of the left L2-3 pars. I dissected through the interspinous ligament and removed with a Kerrison punch. We then used microdissection second through the soft tissue and to identify the exiting  left L2 nerve root . We identified a large far lateral herniated disc just cephalad to the nerve root. We removed it and multiple fragments using a micropituitary forceps.   I then palpated along the ventral surface of the thecal sac and along exit route  of the left L2 nerve  root and noted that the neural structures were well decompressed. This completed the decompression.  We then obtained hemostasis using bipolar electrocautery. We irrigated the wound out with bacitracin solution. We then removed the retractor. We then reapproximated the patient's thoracolumbar fascia with interrupted #1 Vicryl suture. We then reapproximated the patient's subcutaneous tissue with interrupted 2-0 Vicryl suture. We then reapproximated patient's skin with Steri-Strips and benzoin. The was then coated with bacitracin ointment. The drapes were removed. The patient was subsequently returned to the supine position where they were extubated by the anesthesia team. The patient was then transported to the postanesthesia care unit in stable condition. All sponge instrument and needle counts were reportedly correct at the end of this case.

## 2015-04-15 NOTE — Progress Notes (Signed)
Subjective:  The patient is somnolent but arousable. She is in no apparent distress.  Objective: Vital signs in last 24 hours: Temp:  [98.4 F (36.9 C)] 98.4 F (36.9 C) (10/19 0900) Pulse Rate:  [77] 77 (10/19 0900) Resp:  [16] 16 (10/19 0900) BP: (167)/(74) 167/74 mmHg (10/19 0900) SpO2:  [99 %] 99 % (10/19 0900) Weight:  [106.142 kg (234 lb)] 106.142 kg (234 lb) (10/19 0900)  Intake/Output from previous day:   Intake/Output this shift: Total I/O In: 1250 [I.V.:1250] Out: 100 [Blood:100]  Physical exam the patient is solid but arousable. She is moving her lower extremities well.  Lab Results:  Recent Labs  04/15/15 0922  WBC 7.9  HGB 11.7*  HCT 37.2  PLT 248   BMET  Recent Labs  04/15/15 0922  NA 138  K 4.2  CL 104  CO2 23  GLUCOSE 183*  BUN 14  CREATININE 1.14*  CALCIUM 9.5    Studies/Results: No results found.  Assessment/Plan: The patient is doing well.      [name] 04/15/2015, 1:46 PM

## 2015-04-15 NOTE — Anesthesia Preprocedure Evaluation (Addendum)
Anesthesia Evaluation  Patient identified by MRN, date of birth, ID band Patient awake    Reviewed: Allergy & Precautions, NPO status , Patient's Chart, lab work & pertinent test results  Airway Mallampati: III  TM Distance: >3 FB Neck ROM: Full    Dental  (+) Teeth Intact, Dental Advisory Given   Pulmonary sleep apnea , COPD, former smoker,    breath sounds clear to auscultation       Cardiovascular hypertension, Pt. on medications + CAD, + CABG and + Peripheral Vascular Disease  + Valvular Problems/Murmurs (s/p mitral valve repair)  Rhythm:Regular Rate:Normal     Neuro/Psych Depression CVA    GI/Hepatic Neg liver ROS, GERD  ,  Endo/Other  diabetes, Type 2, Oral Hypoglycemic AgentsMorbid obesity  Renal/GU Renal InsufficiencyRenal disease     Musculoskeletal  (+) Arthritis ,   Abdominal   Peds  Hematology  (+) anemia ,   Anesthesia Other Findings   Reproductive/Obstetrics                            Lab Results  Component Value Date   WBC 7.9 04/15/2015   HGB 11.7* 04/15/2015   HCT 37.2 04/15/2015   MCV 80.9 04/15/2015   PLT 248 04/15/2015   Lab Results  Component Value Date   CREATININE 1.14* 04/15/2015   BUN 14 04/15/2015   NA 138 04/15/2015   K 4.2 04/15/2015   CL 104 04/15/2015   CO2 23 04/15/2015    Anesthesia Physical Anesthesia Plan  ASA: III  Anesthesia Plan: General   Post-op Pain Management:    Induction: Intravenous  Airway Management Planned: Oral ETT  Additional Equipment:   Intra-op Plan:   Post-operative Plan: Extubation in OR  Informed Consent: I have reviewed the patients History and Physical, chart, labs and discussed the procedure including the risks, benefits and alternatives for the proposed anesthesia with the patient or authorized representative who has indicated his/her understanding and acceptance.   Dental advisory given  Plan Discussed  with: CRNA  Anesthesia Plan Comments:         Anesthesia Quick Evaluation

## 2015-04-15 NOTE — Anesthesia Postprocedure Evaluation (Signed)
  Anesthesia Post-op Note  Patient: [name]  Procedure(s) Performed: Procedure(s) with comments: LUMBAR LAMINECTOMY/DECOMPRESSION MICRODISCECTOMY 1 LEVEL (Left) - Left L23 microdiskectomy  Patient Location: PACU  Anesthesia Type:General  Level of Consciousness: awake and alert   Airway and Oxygen Therapy: Patient Spontanous Breathing  Post-op Pain: mild  Post-op Assessment: Post-op Vital signs reviewed              Post-op Vital Signs: Reviewed  Last Vitals:  Filed Vitals:   04/15/15 1430  BP: 185/73  Pulse: 69  Temp:   Resp: 12    Complications: No apparent anesthesia complications

## 2015-04-15 NOTE — Anesthesia Procedure Notes (Signed)
Procedure Name: Intubation Date/Time: 04/15/2015 11:48 AM Performed by: Eligha Bridegroom Pre-anesthesia Checklist: Emergency Drugs available, Patient identified, Timeout performed, Suction available and Patient being monitored Patient Re-evaluated:Patient Re-evaluated prior to inductionOxygen Delivery Method: Circle system utilized Preoxygenation: Pre-oxygenation with 100% oxygen Intubation Type: IV induction Ventilation: Mask ventilation without difficulty and Oral airway inserted - appropriate to patient size Laryngoscope Size: Mac and 4 Grade View: Grade III Tube type: Oral Tube size: 7.0 mm Airway Equipment and Method: Stylet Placement Confirmation: ETT inserted through vocal cords under direct vision,  breath sounds checked- equal and bilateral and positive ETCO2 Secured at: 21 cm Tube secured with: Tape Dental Injury: Teeth and Oropharynx as per pre-operative assessment  Difficulty Due To: Difficult Airway- due to anterior larynx

## 2015-04-15 NOTE — Evaluation (Signed)
Physical Therapy Evaluation Patient Details Name: [name] MRN: [id number] DOB: 05/07/1951 Today's Date: 04/15/2015   History of Present Illness  pt is a 64 y/o female admitted with L23 disc  herniation, back and left leg radicular pain, s/p L23 microdiscectomy.  Clinical Impression  Pt admitted with/for lumbar surgery..  Pt currently limited functionally due to the problems listed below.  (see problems list.)  Pt will benefit from PT to maximize function and safety to be able to get home safely with available assist of family.     Follow Up Recommendations Home health PT    Equipment Recommendations   (?3 in 1)    Recommendations for Other Services       Precautions / Restrictions Precautions Precautions: Back;Fall Restrictions Weight Bearing Restrictions: No      Mobility  Bed Mobility Overal bed mobility: Needs Assistance Bed Mobility: Sidelying to Sit;Sit to Sidelying;Rolling Rolling: Min assist Sidelying to sit: Min assist     Sit to sidelying: Min assist General bed mobility comments: cued for safest technique and assisted minimally  Transfers Overall transfer level: Needs assistance Equipment used: Rolling walker (2 wheeled) Transfers: Sit to/from Stand Sit to Stand: Min assist         General transfer comment: cued for safety  Ambulation/Gait Ambulation/Gait assistance: Min guard Ambulation Distance (Feet): 250 Feet Assistive device: Rolling walker (2 wheeled) Gait Pattern/deviations: Step-through pattern Gait velocity: slower   General Gait Details: mildly unsteady and guarded with RW  Stairs            Wheelchair Mobility    Modified Rankin (Stroke Patients Only)       Balance Overall balance assessment: Needs assistance   Sitting balance-Leahy Scale: Good     Standing balance support: No upper extremity supported Standing balance-Leahy Scale: Fair                               Pertinent Vitals/Pain  Pain Assessment: 0-10 Pain Score: 6  Pain Location: back Pain Descriptors / Indicators: Aching;Sore Pain Intervention(s): Monitored during session    Home Living Family/patient expects to be discharged to:: Private residence Living Arrangements: Spouse/significant other Available Help at Discharge: Family;Available PRN/intermittently (24/7 through the weekend) Type of Home: House Home Access: Stairs to enter Entrance Stairs-Rails: Right;Left Entrance Stairs-Number of Steps: several Home Layout: One level Home Equipment: Walker - 2 wheels;Cane - single point      Prior Function Level of Independence: Independent with assistive device(s)               Hand Dominance        Extremity/Trunk Assessment   Upper Extremity Assessment: Overall WFL for tasks assessed           Lower Extremity Assessment: Overall WFL for tasks assessed;Generalized weakness (L generally weak from long term pain)         Communication   Communication: No difficulties  Cognition Arousal/Alertness: Awake/alert Behavior During Therapy: WFL for tasks assessed/performed Overall Cognitive Status: Within Functional Limits for tasks assessed                      General Comments General comments (skin integrity, edema, etc.): Advised pt of back prec/care, log roll, lifting restrictions and progression of activity    Exercises        Assessment/Plan    PT Assessment Patient needs continued PT services  PT Diagnosis Acute pain;Generalized weakness  PT Problem List Decreased strength;Decreased activity tolerance;Decreased mobility;Decreased knowledge of use of DME;Decreased knowledge of precautions;Pain  PT Treatment Interventions DME instruction;Gait training;Stair training;Functional mobility training;Therapeutic activities;Patient/family education   PT Goals (Current goals can be found in the Care Plan section) Acute Rehab PT Goals Patient Stated Goal: Home independent PT Goal  Formulation: With patient Time For Goal Achievement: 04/22/15 Potential to Achieve Goals: Good    Frequency Min 5X/week   Barriers to discharge        Co-evaluation               End of Session   Activity Tolerance: Patient tolerated treatment well Patient left: in bed;in CPM Nurse Communication: Mobility status    Functional Assessment Tool Used: clinical judgement Functional Limitation: Mobility: Walking and moving around Mobility: Walking and Moving Around Current Status [id number]): At least 1 percent but less than 20 percent impaired, limited or restricted Mobility: Walking and Moving Around Goal Status [phone]): At least 1 percent but less than 20 percent impaired, limited or restricted    Time: 1745-1814 PT Time Calculation (min) (ACUTE ONLY): 29 min   Charges:   PT Evaluation $Initial PT Evaluation Tier I: 1 Procedure PT Treatments $Gait Training: 8-22 mins   PT G Codes:   PT G-Codes **NOT FOR INPATIENT CLASS** Functional Assessment Tool Used: clinical judgement Functional Limitation: Mobility: Walking and moving around Mobility: Walking and Moving Around Current Status [id number]): At least 1 percent but less than 20 percent impaired, limited or restricted Mobility: Walking and Moving Around Goal Status [phone]): At least 1 percent but less than 20 percent impaired, limited or restricted    [name], Tessie Fass 04/15/2015, 6:31 PM  04/15/2015  Donnella Sham, PT [phone]  (pager)

## 2015-04-15 NOTE — H&P (Signed)
Subjective: The patient is a 64 year old black female who has complained of back and left leg pain consistent with a lumbar radiculopathy. She has failed medical management and has been worked up with a lumbar MRI which demonstrated a far lateral herniated disc at L2-3 on the left. I discussed the various treatment options with the patient including surgery. She has weighed the risks, benefits, and alternative surgery and decided to proceed with a left L2-3 far lateral discectomy.   Past Medical History  Diagnosis Date  . Coronary artery disease     a. s/p CABG 06/2010: S-D2; S-PDA (at time of MV surgery).  . 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 mitral valve - s/p resection and MV repair 06/2010 Dr. [name].  . Hypertension   . Hx of transient ischemic attack (TIA)     a. See stroke section.  . Anemia, iron deficiency   . Stenosis of middle cerebral artery     a. Distal R MCA.  [name] Abscess in epidural space of L2-L5 lumbar spine 03/2006  . Arthritis   . Colon polyp     a. Multiple colonic polyps status post colonoscopy in October 2007, consistent with tubular adenoma, tubulovillous adenoma with no high-grade dysplasia or malignancy identified.   . Sleep apnea   . Cataract   . Depression   . GERD (gastroesophageal reflux disease)   . Hyperlipidemia   . MRSA infection     a. History of recurrent skin infection and soft tissue abscesses, with MRSA positive in the past.  . Hypoxia     a. Has history of acute hypoxic respiratory failure in the setting of bronchitis/PNA or prior admissions.  . 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 CAD (coronary artery disease), native coronary artery 03/30/2015  . Diabetes mellitus     type II  . Constipation   . Stroke [facility])     a. 11/2009: mitral mass diagnosed at this time, also has distal R MCA stenosis, tx with coumadin. b. Readmitted 05/2010 with TIA symptoms - had not been taking Coumadin. s/p MV surgery 06/2010. Coumadin stopped 2013  after review of chart by Dr. [name] since mass was removed (stroke felt possibly related to this).     Past Surgical History  Procedure Laterality Date  . Vesicovaginal fistula closure w/ tah    . History of ankle fractures requiring surgery    . Mv repair and resection of mass  1/12  . Total abdominal hysterectomy  1978  . Tonsillectomy  1971  . Cardiac catheterization  04/03/2015    Procedure: Left Heart Cath and Cors/Grafts Angiography;  Surgeon: Peter M Martinique, MD;  Location: [facility];  Service: Cardiovascular;;  . Coronary artery bypass graft  1/12    No Known Allergies  Social History  Substance Use Topics  . Smoking status: Former Smoker    Quit date: 10/14/2010  . Smokeless tobacco: Never Used  . Alcohol Use: No    Family History  Problem Relation Age of Onset  . Colon cancer Mother   . Prostate cancer Father   . Cirrhosis Sister     died of GI bleed associated with cirrhosis of the liver  . Stroke Brother   . Diabetes Brother     x3  . Kidney disease Brother   . Irritable bowel syndrome Mother    Prior to Admission medications   Medication Sig Start Date End Date Taking? Authorizing Provider  amLODipine (NORVASC) 10 MG tablet Take  1 tablet (10 mg total) by mouth daily. 03/26/15  Yes Lance Bosch, NP  aspirin EC 81 MG tablet Take 1 tablet (81 mg total) by mouth daily. 06/26/14  Yes Lance Bosch, NP  atorvastatin (LIPITOR) 20 MG tablet Take 1 tablet (20 mg total) by mouth daily. 03/26/15  Yes Lance Bosch, NP  hydrALAZINE (APRESOLINE) 25 MG tablet Take 1 tablet (25 mg total) by mouth 3 (three) times daily. 03/26/15  Yes Lance Bosch, NP  linagliptin (TRADJENTA) 5 MG TABS tablet Take 1 tablet (5 mg total) by mouth daily. 03/26/15  Yes Lance Bosch, NP  losartan (COZAAR) 100 MG tablet Take 1 tablet (100 mg total) by mouth daily. 03/26/15  Yes Lance Bosch, NP  methocarbamol (ROBAXIN) 500 MG tablet Take 1 tablet (500 mg total) by mouth 3 (three) times  daily. 03/26/15  Yes Lance Bosch, NP  metoCLOPramide (REGLAN) 10 MG tablet Take 10 mg by mouth 3 (three) times daily before meals.    Yes Historical Provider, MD  oxyCODONE-acetaminophen (PERCOCET) 10-325 MG tablet Take 1 tablet by mouth every 4 (four) hours as needed for pain.  03/10/15  Yes Historical Provider, MD  pantoprazole (PROTONIX) 40 MG tablet Take 1 tablet (40 mg total) by mouth daily. 06/26/14  Yes Lance Bosch, NP  albuterol (PROVENTIL HFA;VENTOLIN HFA) 108 (90 BASE) MCG/ACT inhaler Inhale 2 puffs into the lungs every 6 (six) hours as needed for wheezing or shortness of breath. 02/07/14   Tresa Garter, MD  ibuprofen (ADVIL,MOTRIN) 200 MG tablet Take 200 mg by mouth every 4 (four) hours as needed for fever, headache, mild pain, moderate pain or cramping.     Historical Provider, MD  ondansetron (ZOFRAN-ODT) 4 MG disintegrating tablet Take 4 mg by mouth every 8 (eight) hours as needed for nausea or vomiting.    Historical Provider, MD     Review of Systems  Positive ROS: As above  All other systems have been reviewed and were otherwise negative with the exception of those mentioned in the HPI and as above.  Objective: Vital signs in last 24 hours: Temp:  [98.4 F (36.9 C)] 98.4 F (36.9 C) (10/19 0900) Pulse Rate:  [77] 77 (10/19 0900) Resp:  [16] 16 (10/19 0900) BP: (167)/(74) 167/74 mmHg (10/19 0900) SpO2:  [99 %] 99 % (10/19 0900) Weight:  [106.142 kg (234 lb)] 106.142 kg (234 lb) (10/19 0900)  General Appearance: Alert, cooperative, no distress, Head: Normocephalic, without obvious abnormality, atraumatic Eyes: PERRL, conjunctiva/corneas clear, EOM's intact,    Ears: Normal  Throat: Normal  Neck: Supple, symmetrical, trachea midline, no adenopathy; thyroid: No enlargement/tenderness/nodules; no carotid bruit or JVD Back: Symmetric, no curvature, ROM normal, no CVA tenderness Lungs: Clear to auscultation bilaterally, respirations unlabored Heart: Regular rate  and rhythm, no murmur, rub or gallop Abdomen: Soft, non-tender,, no masses, no organomegaly Extremities: Extremities normal, atraumatic, no cyanosis or edema Pulses: 2+ and symmetric all extremities Skin: Skin color, texture, turgor normal, no rashes or lesions  NEUROLOGIC:   Mental status: alert and oriented, no aphasia, good attention span, Fund of knowledge/ memory ok Motor Exam - grossly normal Sensory Exam - grossly normal Reflexes:  Coordination - grossly normal Gait - grossly normal Balance - grossly normal Cranial Nerves: I: smell Not tested  II: visual acuity  OS: Normal  OD: Normal   II: visual fields Full to confrontation  II: pupils Equal, round, reactive to light  III,VII: ptosis None  III,IV,VI: extraocular  muscles  Full ROM  V: mastication Normal  V: facial light touch sensation  Normal  V,VII: corneal reflex  Present  VII: facial muscle function - upper  Normal  VII: facial muscle function - lower Normal  VIII: hearing Not tested  IX: soft palate elevation  Normal  IX,X: gag reflex Present  XI: trapezius strength  5/5  XI: sternocleidomastoid strength 5/5  XI: neck flexion strength  5/5  XII: tongue strength  Normal    Data Review Lab Results  Component Value Date   WBC 7.9 04/15/2015   HGB 11.7* 04/15/2015   HCT 37.2 04/15/2015   MCV 80.9 04/15/2015   PLT 248 04/15/2015   Lab Results  Component Value Date   NA 138 04/15/2015   K 4.2 04/15/2015   CL 104 04/15/2015   CO2 23 04/15/2015   BUN 14 04/15/2015   CREATININE 1.14* 04/15/2015   GLUCOSE 183* 04/15/2015   Lab Results  Component Value Date   INR 0.95 04/03/2015    Assessment/Plan: Left L2-3 far lateral disc, lumbago, lumbar radiculopathy: I have discussed the situation with the patient. I have reviewed her imaging studies with her and pointed out the abnormalities. We have discussed the various treatment options including surgery. I have described the surgical treatment option of left  L2-3 far lateral discectomy. I have shown her surgical models. We have discussed the risks, benefits, alternatives, and likelihood of achieving goals with surgery. I have answered all the patient's questions. She has decided to proceed with surgery.   Villa Burgin D 04/15/2015 10:49 AM

## 2015-04-15 NOTE — Plan of Care (Signed)
Problem: Consults Goal: Diagnosis - Spinal Surgery Outcome: Completed/Met Date Met:  04/15/15 Microdiscectomy     

## 2015-04-15 NOTE — Transfer of Care (Signed)
Immediate Anesthesia Transfer of Care Note  Patient: [name]  Procedure(s) Performed: Procedure(s) with comments: LUMBAR LAMINECTOMY/DECOMPRESSION MICRODISCECTOMY 1 LEVEL (Left) - Left L23 microdiskectomy  Patient Location: PACU  Anesthesia Type:General  Level of Consciousness: awake and alert   Airway & Oxygen Therapy: Patient Spontanous Breathing and Patient connected to nasal cannula oxygen  Post-op Assessment: Report given to RN and Post -op Vital signs reviewed and stable  Post vital signs: Reviewed and stable  Last Vitals:  Filed Vitals:   04/15/15 0900  BP: 167/74  Pulse: 77  Temp: 36.9 C  Resp: 16    Complications: No apparent anesthesia complications

## 2015-04-16 ENCOUNTER — Encounter (HOSPITAL_COMMUNITY): Payer: Self-pay | Admitting: Neurosurgery

## 2015-04-16 DIAGNOSIS — M5116 Intervertebral disc disorders with radiculopathy, lumbar region: Secondary | ICD-10-CM | POA: Diagnosis not present

## 2015-04-16 LAB — GLUCOSE, CAPILLARY
GLUCOSE-CAPILLARY: 168 mg/dL — AB (ref 65–99)
Glucose-Capillary: 173 mg/dL — ABNORMAL HIGH (ref 65–99)

## 2015-04-16 MED ORDER — CYCLOBENZAPRINE HCL 10 MG PO TABS: 10.0000 mg | ORAL_TABLET | Freq: Three times a day (TID) | ORAL | Status: DC | PRN

## 2015-04-16 MED ORDER — MUPIROCIN 2 % EX OINT
1.0000 "application " | TOPICAL_OINTMENT | Freq: Two times a day (BID) | CUTANEOUS | Status: DC
  Administered 2015-04-16: 1 via NASAL

## 2015-04-16 MED ORDER — DOCUSATE SODIUM 100 MG PO CAPS: 100.0000 mg | ORAL_CAPSULE | Freq: Two times a day (BID) | ORAL | Status: DC

## 2015-04-16 MED ORDER — OXYCODONE-ACETAMINOPHEN 10-325 MG PO TABS: 1.0000 | ORAL_TABLET | ORAL | Status: DC | PRN

## 2015-04-16 NOTE — Progress Notes (Signed)
Patient alert and oriented, mae's well, voiding adequate amount of urine, swallowing without difficulty, no c/o pain. Patient discharged home with family. Script and discharged instructions given to patient. Patient and family stated understanding of d/c instructions given and has an appointment with MD. 

## 2015-04-16 NOTE — Discharge Summary (Signed)
Physician Discharge Summary  Patient ID: [name] MRN: [id number] DOB/AGE: Feb 10, 1951 64 y.o.  Admit date: 04/15/2015 Discharge date: 04/16/2015  Admission Diagnoses: Left L2-3 far lateral herniated disc, lumbago, lumbar radiculopathy  Discharge Diagnoses: The same Active Problems:   Lumbar herniated disc   Discharged Condition: good  Hospital Course: I performed a left L2-3 far lateral discectomy on the patient on 04/15/2015. Surgery went well.  On postoperative day #1 the patient requested discharge home. She was given oral and written discharge instructions. All her questions were answered.    Consults: Physical therapy  Significant Diagnostic Studies: None Treatments: Left L2-3 far lateral discectomy using microdissection Discharge Exam: Blood pressure 107/56, pulse 76, temperature 98.8 F (37.1 C), temperature source Oral, resp. rate 18, height 5' 7.25" (1.708 m), weight 106.142 kg (234 lb), SpO2 97 %. The patient is alert and pleasant. She looks well. She is moving her lower extremities well.  Disposition: Home     Medication List    STOP taking these medications        methocarbamol 500 MG tablet  Commonly known as:  ROBAXIN      TAKE these medications        albuterol 108 (90 BASE) MCG/ACT inhaler  Commonly known as:  PROVENTIL HFA;VENTOLIN HFA  Inhale 2 puffs into the lungs every 6 (six) hours as needed for wheezing or shortness of breath.     amLODipine 10 MG tablet  Commonly known as:  NORVASC  Take 1 tablet (10 mg total) by mouth daily.     aspirin EC 81 MG tablet  Take 1 tablet (81 mg total) by mouth daily.     atorvastatin 20 MG tablet  Commonly known as:  LIPITOR  Take 1 tablet (20 mg total) by mouth daily.     cyclobenzaprine 10 MG tablet  Commonly known as:  FLEXERIL  Take 1 tablet (10 mg total) by mouth 3 (three) times daily as needed for muscle spasms.     docusate sodium 100 MG capsule  Commonly known as:  COLACE  Take 1  capsule (100 mg total) by mouth 2 (two) times daily.     hydrALAZINE 25 MG tablet  Commonly known as:  APRESOLINE  Take 1 tablet (25 mg total) by mouth 3 (three) times daily.     ibuprofen 200 MG tablet  Commonly known as:  ADVIL,MOTRIN  Take 200 mg by mouth every 4 (four) hours as needed for fever, headache, mild pain, moderate pain or cramping.     linagliptin 5 MG Tabs tablet  Commonly known as:  TRADJENTA  Take 1 tablet (5 mg total) by mouth daily.     losartan 100 MG tablet  Commonly known as:  COZAAR  Take 1 tablet (100 mg total) by mouth daily.     metoCLOPramide 10 MG tablet  Commonly known as:  REGLAN  Take 10 mg by mouth 3 (three) times daily before meals.     ondansetron 4 MG disintegrating tablet  Commonly known as:  ZOFRAN-ODT  Take 4 mg by mouth every 8 (eight) hours as needed for nausea or vomiting.     oxyCODONE-acetaminophen 10-325 MG tablet  Commonly known as:  PERCOCET  Take 1 tablet by mouth every 4 (four) hours as needed for pain.     oxyCODONE-acetaminophen 10-325 MG tablet  Commonly known as:  PERCOCET  Take 1 tablet by mouth every 4 (four) hours as needed for pain.     pantoprazole 40 MG tablet  Commonly known as:  PROTONIX  Take 1 tablet (40 mg total) by mouth daily.         SignedOphelia Charter 04/16/2015, 7:37 AM

## 2015-04-16 NOTE — Progress Notes (Signed)
Physical Therapy Treatment Patient Details Name: [name] MRN: [id number] DOB: 08/07/1950 Today's Date: 04/16/2015    History of Present Illness pt is a 64 y/o female admitted with L23 disc  herniation, back and left leg radicular pain, s/p L23 microdiscectomy.    PT Comments    Pt moves slowly and needs encouragement, but demonstrates good safety.  Pt indicates eager for D/C today.  Feel pt will be safe to D/C home with husband's support.    Follow Up Recommendations  Home health PT;Supervision - Intermittent     Equipment Recommendations  3in1 (PT)    Recommendations for Other Services       Precautions / Restrictions Precautions Precautions: Back;Fall Precaution Comments: Reviewed back precautions Restrictions Weight Bearing Restrictions: No    Mobility  Bed Mobility Overal bed mobility: Needs Assistance Bed Mobility: Rolling;Sidelying to Sit;Sit to Sidelying Rolling: Supervision Sidelying to sit: Supervision     Sit to sidelying: Min assist General bed mobility comments: pt able to demonstrate good log roll technique and only needed A to return LEs to the bed.    Transfers Overall transfer level: Needs assistance Equipment used: Rolling walker (2 wheeled) Transfers: Sit to/from Stand Sit to Stand: Min guard         General transfer comment: cues for UE use.    Ambulation/Gait Ambulation/Gait assistance: Min guard Ambulation Distance (Feet): 250 Feet Assistive device: Rolling walker (2 wheeled) Gait Pattern/deviations: Step-through pattern;Decreased stride length     General Gait Details: mildly unsteady and guarded with RW   Stairs Stairs: Yes Stairs assistance: Min guard Stair Management: One rail Left;Step to pattern;Sideways Number of Stairs: 6 General stair comments: cues for stair technique and safety.  pt moves slowly, but able to complete without physical A.    Wheelchair Mobility    Modified Rankin (Stroke Patients Only)        Balance Overall balance assessment: Needs assistance Sitting-balance support: No upper extremity supported;Feet supported Sitting balance-Leahy Scale: Good     Standing balance support: During functional activity Standing balance-Leahy Scale: Fair                      Cognition Arousal/Alertness: Awake/alert Behavior During Therapy: WFL for tasks assessed/performed Overall Cognitive Status: Within Functional Limits for tasks assessed                      Exercises      General Comments        Pertinent Vitals/Pain Pain Assessment: 0-10 Pain Score: 5  Pain Location: Back Pain Descriptors / Indicators: Aching;Pressure Pain Intervention(s): Monitored during session;Premedicated before session;Repositioned    Home Living                      Prior Function            PT Goals (current goals can now be found in the care plan section) Acute Rehab PT Goals Patient Stated Goal: Home independent PT Goal Formulation: With patient Time For Goal Achievement: 04/22/15 Potential to Achieve Goals: Good Progress towards PT goals: Progressing toward goals    Frequency  Min 5X/week    PT Plan Current plan remains appropriate    Co-evaluation             End of Session Equipment Utilized During Treatment: Gait belt Activity Tolerance: Patient tolerated treatment well Patient left: in bed;with call bell/phone within reach;with family/visitor present     Time:  [id number] PT Time Calculation (min) (ACUTE ONLY): 32 min  Charges:  $Gait Training: 23-37 mins                    G CodesCatarina Hartshorn, [place] 04/16/2015, 12:11 PM

## 2015-05-05 ENCOUNTER — Telehealth: Payer: Self-pay | Admitting: Internal Medicine

## 2015-05-05 NOTE — Telephone Encounter (Signed)
[name] from [facility] called stating that pt. Was referred to do an to them and pt. Missed the appt. Kasey followed up with pt. And pt informed that she had surgery and was going to call back to schedule an appt.

## 2015-05-27 ENCOUNTER — Other Ambulatory Visit: Payer: Self-pay | Admitting: Internal Medicine

## 2015-05-27 DIAGNOSIS — J441 Chronic obstructive pulmonary disease with (acute) exacerbation: Secondary | ICD-10-CM

## 2015-05-27 MED ORDER — ALBUTEROL SULFATE HFA 108 (90 BASE) MCG/ACT IN AERS: 2.0000 | INHALATION_SPRAY | Freq: Four times a day (QID) | RESPIRATORY_TRACT | Status: DC | PRN

## 2015-05-28 ENCOUNTER — Other Ambulatory Visit: Payer: Self-pay | Admitting: Internal Medicine

## 2015-06-04 ENCOUNTER — Telehealth: Payer: Self-pay | Admitting: Internal Medicine

## 2015-06-04 NOTE — Telephone Encounter (Signed)
Patient came in requesting a medication refill for, Tramadol, Trazadone, Albuterol   Fax to [facility]

## 2015-06-15 ENCOUNTER — Ambulatory Visit: Payer: Medicare HMO | Admitting: Internal Medicine

## 2015-06-23 ENCOUNTER — Ambulatory Visit: Payer: Medicare HMO | Attending: Internal Medicine | Admitting: Internal Medicine

## 2015-06-23 ENCOUNTER — Encounter: Payer: Self-pay | Admitting: Internal Medicine

## 2015-06-23 VITALS — BP 136/79 | HR 90 | Temp 98.0°F | Resp 16 | Ht 68.0 in | Wt 235.6 lb

## 2015-06-23 DIAGNOSIS — I129 Hypertensive chronic kidney disease with stage 1 through stage 4 chronic kidney disease, or unspecified chronic kidney disease: Secondary | ICD-10-CM | POA: Diagnosis not present

## 2015-06-23 DIAGNOSIS — E785 Hyperlipidemia, unspecified: Secondary | ICD-10-CM | POA: Insufficient documentation

## 2015-06-23 DIAGNOSIS — I10 Essential (primary) hypertension: Secondary | ICD-10-CM | POA: Diagnosis not present

## 2015-06-23 DIAGNOSIS — N183 Chronic kidney disease, stage 3 unspecified: Secondary | ICD-10-CM

## 2015-06-23 DIAGNOSIS — Z7982 Long term (current) use of aspirin: Secondary | ICD-10-CM | POA: Diagnosis not present

## 2015-06-23 DIAGNOSIS — F329 Major depressive disorder, single episode, unspecified: Secondary | ICD-10-CM | POA: Insufficient documentation

## 2015-06-23 DIAGNOSIS — K219 Gastro-esophageal reflux disease without esophagitis: Secondary | ICD-10-CM | POA: Diagnosis not present

## 2015-06-23 DIAGNOSIS — E119 Type 2 diabetes mellitus without complications: Secondary | ICD-10-CM | POA: Diagnosis not present

## 2015-06-23 DIAGNOSIS — I2510 Atherosclerotic heart disease of native coronary artery without angina pectoris: Secondary | ICD-10-CM | POA: Insufficient documentation

## 2015-06-23 DIAGNOSIS — Z8673 Personal history of transient ischemic attack (TIA), and cerebral infarction without residual deficits: Secondary | ICD-10-CM | POA: Insufficient documentation

## 2015-06-23 DIAGNOSIS — G4730 Sleep apnea, unspecified: Secondary | ICD-10-CM | POA: Diagnosis not present

## 2015-06-23 DIAGNOSIS — Z951 Presence of aortocoronary bypass graft: Secondary | ICD-10-CM | POA: Diagnosis not present

## 2015-06-23 DIAGNOSIS — Z86010 Personal history of colonic polyps: Secondary | ICD-10-CM | POA: Insufficient documentation

## 2015-06-23 DIAGNOSIS — Z87891 Personal history of nicotine dependence: Secondary | ICD-10-CM | POA: Insufficient documentation

## 2015-06-23 LAB — COMPLETE METABOLIC PANEL WITH GFR
ALT: 11 U/L (ref 6–29)
AST: 14 U/L (ref 10–35)
Albumin: 3.8 g/dL (ref 3.6–5.1)
Alkaline Phosphatase: 50 U/L (ref 33–130)
BUN: 17 mg/dL (ref 7–25)
CALCIUM: 9.3 mg/dL (ref 8.6–10.4)
CHLORIDE: 99 mmol/L (ref 98–110)
CO2: 25 mmol/L (ref 20–31)
Creat: 1.14 mg/dL — ABNORMAL HIGH (ref 0.50–0.99)
GFR, EST AFRICAN AMERICAN: 58 mL/min — AB (ref >=60)
GFR, Est Non African American: 50 mL/min — ABNORMAL LOW (ref >=60)
Glucose, Bld: 154 mg/dL — ABNORMAL HIGH (ref 65–99)
POTASSIUM: 4 mmol/L (ref 3.5–5.3)
Sodium: 138 mmol/L (ref 135–146)
Total Bilirubin: 0.5 mg/dL (ref 0.2–1.2)
Total Protein: 6.5 g/dL (ref 6.1–8.1)

## 2015-06-23 LAB — GLUCOSE, POCT (MANUAL RESULT ENTRY): POC GLUCOSE: 164 mg/dL — AB (ref 70–99)

## 2015-06-23 LAB — POCT GLYCOSYLATED HEMOGLOBIN (HGB A1C): Hemoglobin A1C: 8.3

## 2015-06-23 MED ORDER — PANTOPRAZOLE SODIUM 40 MG PO TBEC: 40.0000 mg | DELAYED_RELEASE_TABLET | Freq: Every day | ORAL | Status: DC

## 2015-06-23 MED ORDER — AMLODIPINE BESYLATE 10 MG PO TABS: 10.0000 mg | ORAL_TABLET | Freq: Every day | ORAL | Status: DC

## 2015-06-23 MED ORDER — LOSARTAN POTASSIUM 100 MG PO TABS: 100.0000 mg | ORAL_TABLET | Freq: Every day | ORAL | Status: DC

## 2015-06-23 MED ORDER — METFORMIN HCL 850 MG PO TABS: 850.0000 mg | ORAL_TABLET | Freq: Two times a day (BID) | ORAL | Status: DC

## 2015-06-23 MED ORDER — HYDRALAZINE HCL 25 MG PO TABS
25.0000 mg | ORAL_TABLET | Freq: Three times a day (TID) | ORAL | Status: DC
Start: 2015-06-23 — End: 2016-04-28

## 2015-06-23 MED ORDER — ATORVASTATIN CALCIUM 20 MG PO TABS: 20.0000 mg | ORAL_TABLET | Freq: Every day | ORAL | Status: DC

## 2015-06-23 MED ORDER — LINAGLIPTIN 5 MG PO TABS: 5.0000 mg | ORAL_TABLET | Freq: Every day | ORAL | Status: DC

## 2015-06-23 NOTE — Patient Instructions (Addendum)
Melatonin 5 mg at bedtime to help with sleep. Can get from any pharmacy  I have decided to keep Metformin but we will do close follow ups every 3 months to monitor your kidney function. If it does not continue to improve then we will refer you to a Kidney specialist. Remember uncontrolled diabetes affects the kidneys greatly. It is your responsibility to keep control of your diabetes and high blood pressure to reduce damage to the kidneys. If no improvement in sugars it may be necessary for you to go on insulin

## 2015-06-23 NOTE — Progress Notes (Signed)
Patient ID: [name], female   DOB: 10-06-1950, 64 y.o.   MRN: [id number]  CC: follow up   HPI: [name] is a 64 y.o. female here today for a follow up visit.  Patient has past medical history of coronary artery disease, hypertension, stroke, diabetes, sleep apnea. Patient reports that she has been very stressed with her family and feels like her sugars are not as controlled as they should be. She states that she did enjoy several foods during the holidays and has not been as compliant with her diet. Patient reports that she has continued to take Metformin because she was not informed to stop last year. She does admit to only taking the Metformin once per day if she remembers.   Patient has No headache, No chest pain, No abdominal pain - No Nausea, No new weakness tingling or numbness, No Cough - SOB.  No Known Allergies Past Medical History  Diagnosis Date  . Coronary artery disease     a. s/p CABG 06/2010: S-D2; S-PDA (at time of MV surgery).  . 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 mitral valve - s/p resection and MV repair 06/2010 Dr. [name].  . Hypertension   . Hx of transient ischemic attack (TIA)     a. See stroke section.  . Anemia, iron deficiency   . Stenosis of middle cerebral artery     a. Distal R MCA.  [name] Abscess in epidural space of L2-L5 lumbar spine 03/2006  . Arthritis   . Colon polyp     a. Multiple colonic polyps status post colonoscopy in October 2007, consistent with tubular adenoma, tubulovillous adenoma with no high-grade dysplasia or malignancy identified.   . Sleep apnea   . Cataract   . Depression   . GERD (gastroesophageal reflux disease)   . Hyperlipidemia   . MRSA infection     a. History of recurrent skin infection and soft tissue abscesses, with MRSA positive in the past.  . Hypoxia     a. Has history of acute hypoxic respiratory failure in the setting of bronchitis/PNA or prior admissions.  . Papillary fibroelastoma of heart  03/30/2015  . CAD (coronary artery disease), native coronary artery 03/30/2015  . Diabetes mellitus     type II  . Constipation   . Stroke [facility])     a. 11/2009: mitral mass diagnosed at this time, also has distal R MCA stenosis, tx with coumadin. b. Readmitted 05/2010 with TIA symptoms - had not been taking Coumadin. s/p MV surgery 06/2010. Coumadin stopped 2013 after review of chart by Dr. [name] since mass was removed (stroke felt possibly related to this).    Current Outpatient Prescriptions on File Prior to Visit  Medication Sig Dispense Refill  . albuterol (PROVENTIL HFA;VENTOLIN HFA) 108 (90 BASE) MCG/ACT inhaler Inhale 2 puffs into the lungs every 6 (six) hours as needed for wheezing or shortness of breath. 1 Inhaler 2  . amLODipine (NORVASC) 10 MG tablet Take 1 tablet (10 mg total) by mouth daily. 30 tablet 3  . aspirin EC 81 MG tablet Take 1 tablet (81 mg total) by mouth daily. 30 tablet 5  . atorvastatin (LIPITOR) 20 MG tablet Take 1 tablet (20 mg total) by mouth daily. 30 tablet 3  . hydrALAZINE (APRESOLINE) 25 MG tablet Take 1 tablet (25 mg total) by mouth 3 (three) times daily. 180 tablet 3  . linagliptin (TRADJENTA) 5 MG TABS tablet Take 1 tablet (5 mg total) by mouth  daily. 30 tablet 3  . losartan (COZAAR) 100 MG tablet Take 1 tablet (100 mg total) by mouth daily. 30 tablet 3  . cyclobenzaprine (FLEXERIL) 10 MG tablet Take 1 tablet (10 mg total) by mouth 3 (three) times daily as needed for muscle spasms. 50 tablet 1  . docusate sodium (COLACE) 100 MG capsule Take 1 capsule (100 mg total) by mouth 2 (two) times daily. 60 capsule 0  . ibuprofen (ADVIL,MOTRIN) 200 MG tablet Take 200 mg by mouth every 4 (four) hours as needed for fever, headache, mild pain, moderate pain or cramping. Reported on 06/23/2015    . metoCLOPramide (REGLAN) 10 MG tablet Take 10 mg by mouth 3 (three) times daily before meals. Reported on 06/23/2015    . ondansetron (ZOFRAN-ODT) 4 MG disintegrating tablet Take  4 mg by mouth every 8 (eight) hours as needed for nausea or vomiting. Reported on 06/23/2015    . oxyCODONE-acetaminophen (PERCOCET) 10-325 MG tablet Take 1 tablet by mouth every 4 (four) hours as needed for pain.     [name] oxyCODONE-acetaminophen (PERCOCET) 10-325 MG tablet Take 1 tablet by mouth every 4 (four) hours as needed for pain. 100 tablet 0  . pantoprazole (PROTONIX) 40 MG tablet Take 1 tablet (40 mg total) by mouth daily. 30 tablet 3   No current facility-administered medications on file prior to visit.   Family History  Problem Relation Age of Onset  . Colon cancer Mother   . Prostate cancer Father   . Cirrhosis Sister     died of GI bleed associated with cirrhosis of the liver  . Stroke Brother   . Diabetes Brother     x3  . Kidney disease Brother   . Irritable bowel syndrome Mother    Social History   Social History  . Marital Status: Married    Spouse Name: N/A  . Number of Children: 3  . Years of Education: N/A   Occupational History  .     Social History Main Topics  . Smoking status: Former Smoker    Quit date: 10/14/2010  . Smokeless tobacco: Never Used  . Alcohol Use: No  . Drug Use: No  . Sexual Activity: No   Other Topics Concern  . Not on file   Social History Narrative   Lives with husband, stay at home, uses cane occasionally, still active/ambulatory.     Review of Systems: Other than what is stated in HPI, all other systems are negative.   Objective:   Filed Vitals:   06/23/15 1625  BP: 136/79  Pulse: 90  Temp: 98 F (36.7 C)  Resp: 16    Physical Exam  Constitutional: She is oriented to person, place, and time.  Cardiovascular: Normal rate, regular rhythm and normal heart sounds.   Pulmonary/Chest: Effort normal and breath sounds normal.  Musculoskeletal: She exhibits no edema.  Neurological: She is alert and oriented to person, place, and time.  Skin: Skin is warm and dry.  Psychiatric: She has a normal mood and affect.      Lab Results  Component Value Date   WBC 7.9 04/15/2015   HGB 11.7* 04/15/2015   HCT 37.2 04/15/2015   MCV 80.9 04/15/2015   PLT 248 04/15/2015   Lab Results  Component Value Date   CREATININE 1.14* 04/15/2015   BUN 14 04/15/2015   NA 138 04/15/2015   K 4.2 04/15/2015   CL 104 04/15/2015   CO2 23 04/15/2015    Lab Results  Component  Value Date   HGBA1C 8.30 06/23/2015   Lipid Panel     Component Value Date/Time   CHOL 217* 12/25/2013 0500   TRIG 53 12/25/2013 0500   HDL 118 12/25/2013 0500   CHOLHDL 1.8 12/25/2013 0500   VLDL 11 12/25/2013 0500   LDLCALC 88 12/25/2013 0500       Assessment and plan:   Katilyn was seen today for follow-up.  Diagnoses and all orders for this visit:  Type 2 diabetes mellitus without complication, without long-term current use of insulin (HCC) -     Glucose (CBG) -     Cancel: Glucose (CBG), Fasting -     HgB A1c -     linagliptin (TRADJENTA) 5 MG TABS tablet; Take 1 tablet (5 mg total) by mouth daily. -     COMPLETE METABOLIC PANEL WITH GFR -     metFORMIN (GLUCOPHAGE) 850 MG tablet; Take 1 tablet (850 mg total) by mouth 2 (two) times daily with a meal. Per new recommendations, patient may continue on Metformin until they have a GFR of 30. Will restart Metformin today. Encouraged her to take twice a day as directed. I have stressed that if she does not get her sugars controlled then she may be at risk for requiring insulin later. Explained how uncontrolled sugars and higher A1C places her at risk for further kidney disease  Essential hypertension -     amLODipine (NORVASC) 10 MG tablet; Take 1 tablet (10 mg total) by mouth daily. -     hydrALAZINE (APRESOLINE) 25 MG tablet; Take 1 tablet (25 mg total) by mouth 3 (three) times daily. -     losartan (COZAAR) 100 MG tablet; Take 1 tablet (100 mg total) by mouth daily. Patient blood pressure is stable and may continue on current medication.  Education on diet, exercise, and  modifiable risk factors discussed. Will obtain appropriate labs as needed. Will follow up in 3-6 months.   Hyperlipidemia -     Refill atorvastatin (LIPITOR) 20 MG tablet; Take 1 tablet (20 mg total) by mouth daily. Education provided on proper lifestyle changes in order to lower cholesterol. Patient advised to maintain healthy weight and to keep total fat intake at 25-35% of total calories and carbohydrates 50-60% of total daily calories. Explained how high cholesterol places patient at risk for heart disease. Patient placed on appropriate medication and repeat labs in 6 months   CKD Stressed blood pressure and diabetes control to prevent further damage. No NSAID's. See above  Gastroesophageal reflux disease, esophagitis presence not specified -     pantoprazole (PROTONIX) 40 MG tablet; Take 1 tablet (40 mg total) by mouth daily. Discussed diet and weight with patient relating to acid reflux.  Went over things that may exacerbate acid reflux such as tomatoes, spicy foods, coffee, carbonated beverages, chocolates, etc.  Advised patient to avoid laying down at least two hours after meals and sleep with HOB elevated.    Return in about 3 months (around 09/21/2015) for DM/HTN.       Lance Bosch, [place] [phone] 06/23/2015, 4:37 PM'

## 2015-06-23 NOTE — Progress Notes (Signed)
Patient here for follow up on her diabetes and for medication refills

## 2015-06-24 ENCOUNTER — Telehealth: Payer: Self-pay

## 2015-06-24 NOTE — Telephone Encounter (Signed)
-----   Message from Lance Bosch, NP sent at 06/24/2015 12:34 PM EST ----- Kidney function is still the same. She is ok to stay on Metformin

## 2015-06-24 NOTE — Telephone Encounter (Signed)
Spoke with patient this am  She is aware of her lab results and to continue  with her metformin

## 2015-07-30 ENCOUNTER — Other Ambulatory Visit: Payer: Self-pay | Admitting: Internal Medicine

## 2015-09-16 ENCOUNTER — Other Ambulatory Visit: Payer: Self-pay | Admitting: Internal Medicine

## 2016-04-28 ENCOUNTER — Ambulatory Visit: Payer: Medicare HMO | Attending: Internal Medicine | Admitting: Physician Assistant

## 2016-04-28 ENCOUNTER — Encounter: Payer: Self-pay | Admitting: Physician Assistant

## 2016-04-28 ENCOUNTER — Other Ambulatory Visit: Payer: Self-pay | Admitting: Pharmacist

## 2016-04-28 VITALS — BP 191/84 | HR 77 | Temp 98.2°F | Resp 16 | Wt 238.4 lb

## 2016-04-28 DIAGNOSIS — Z8614 Personal history of Methicillin resistant Staphylococcus aureus infection: Secondary | ICD-10-CM | POA: Diagnosis not present

## 2016-04-28 DIAGNOSIS — E1300 Other specified diabetes mellitus with hyperosmolarity without nonketotic hyperglycemic-hyperosmolar coma (NKHHC): Secondary | ICD-10-CM | POA: Insufficient documentation

## 2016-04-28 DIAGNOSIS — M1990 Unspecified osteoarthritis, unspecified site: Secondary | ICD-10-CM | POA: Insufficient documentation

## 2016-04-28 DIAGNOSIS — E785 Hyperlipidemia, unspecified: Secondary | ICD-10-CM | POA: Insufficient documentation

## 2016-04-28 DIAGNOSIS — I2510 Atherosclerotic heart disease of native coronary artery without angina pectoris: Secondary | ICD-10-CM | POA: Insufficient documentation

## 2016-04-28 DIAGNOSIS — Z951 Presence of aortocoronary bypass graft: Secondary | ICD-10-CM | POA: Insufficient documentation

## 2016-04-28 DIAGNOSIS — F329 Major depressive disorder, single episode, unspecified: Secondary | ICD-10-CM | POA: Insufficient documentation

## 2016-04-28 DIAGNOSIS — Z1231 Encounter for screening mammogram for malignant neoplasm of breast: Secondary | ICD-10-CM | POA: Diagnosis not present

## 2016-04-28 DIAGNOSIS — E784 Other hyperlipidemia: Secondary | ICD-10-CM | POA: Diagnosis not present

## 2016-04-28 DIAGNOSIS — E0800 Diabetes mellitus due to underlying condition with hyperosmolarity without nonketotic hyperglycemic-hyperosmolar coma (NKHHC): Secondary | ICD-10-CM | POA: Diagnosis not present

## 2016-04-28 DIAGNOSIS — D509 Iron deficiency anemia, unspecified: Secondary | ICD-10-CM | POA: Diagnosis not present

## 2016-04-28 DIAGNOSIS — G4730 Sleep apnea, unspecified: Secondary | ICD-10-CM | POA: Insufficient documentation

## 2016-04-28 DIAGNOSIS — E7849 Other hyperlipidemia: Secondary | ICD-10-CM

## 2016-04-28 DIAGNOSIS — I10 Essential (primary) hypertension: Secondary | ICD-10-CM | POA: Insufficient documentation

## 2016-04-28 DIAGNOSIS — J441 Chronic obstructive pulmonary disease with (acute) exacerbation: Secondary | ICD-10-CM | POA: Diagnosis not present

## 2016-04-28 DIAGNOSIS — Z8673 Personal history of transient ischemic attack (TIA), and cerebral infarction without residual deficits: Secondary | ICD-10-CM | POA: Insufficient documentation

## 2016-04-28 DIAGNOSIS — K219 Gastro-esophageal reflux disease without esophagitis: Secondary | ICD-10-CM | POA: Diagnosis not present

## 2016-04-28 LAB — LIPID PANEL
CHOL/HDL RATIO: 2.8 ratio (ref <=5.0)
Cholesterol: 336 mg/dL — ABNORMAL HIGH (ref 125–200)
HDL: 119 mg/dL (ref >=46)
LDL Cholesterol: 198 mg/dL — ABNORMAL HIGH (ref <130)
Triglycerides: 97 mg/dL (ref <150)
VLDL: 19 mg/dL (ref <30)

## 2016-04-28 LAB — CBC WITH DIFFERENTIAL/PLATELET
BASOS PCT: 0 %
Basophils Absolute: 0 cells/uL (ref 0–200)
Eosinophils Absolute: 77 cells/uL (ref 15–500)
Eosinophils Relative: 1 %
HEMATOCRIT: 39.5 % (ref 35.0–45.0)
Hemoglobin: 12.3 g/dL (ref 11.7–15.5)
LYMPHS PCT: 33 %
Lymphs Abs: 2541 cells/uL (ref 850–3900)
MCH: 24.7 pg — ABNORMAL LOW (ref 27.0–33.0)
MCHC: 31.1 g/dL — AB (ref 32.0–36.0)
MCV: 79.3 fL — AB (ref 80.0–100.0)
MONO ABS: 385 {cells}/uL (ref 200–950)
MONOS PCT: 5 %
MPV: 11.1 fL (ref 7.5–12.5)
NEUTROS PCT: 61 %
Neutro Abs: 4697 cells/uL (ref 1500–7800)
PLATELETS: 269 10*3/uL (ref 140–400)
RBC: 4.98 MIL/uL (ref 3.80–5.10)
RDW: 16.1 % — AB (ref 11.0–15.0)
WBC: 7.7 10*3/uL (ref 3.8–10.8)

## 2016-04-28 LAB — COMPREHENSIVE METABOLIC PANEL
ALBUMIN: 4 g/dL (ref 3.6–5.1)
ALT: 9 U/L (ref 6–29)
AST: 15 U/L (ref 10–35)
Alkaline Phosphatase: 62 U/L (ref 33–130)
BILIRUBIN TOTAL: 0.6 mg/dL (ref 0.2–1.2)
BUN: 18 mg/dL (ref 7–25)
CHLORIDE: 102 mmol/L (ref 98–110)
CO2: 27 mmol/L (ref 20–31)
CREATININE: 1.17 mg/dL — AB (ref 0.50–0.99)
Calcium: 9.5 mg/dL (ref 8.6–10.4)
Glucose, Bld: 157 mg/dL — ABNORMAL HIGH (ref 65–99)
Potassium: 4.1 mmol/L (ref 3.5–5.3)
SODIUM: 138 mmol/L (ref 135–146)
TOTAL PROTEIN: 7 g/dL (ref 6.1–8.1)

## 2016-04-28 LAB — GLUCOSE, POCT (MANUAL RESULT ENTRY): POC Glucose: 156 mg/dl — AB (ref 70–99)

## 2016-04-28 LAB — POCT GLYCOSYLATED HEMOGLOBIN (HGB A1C): HEMOGLOBIN A1C: 8.6

## 2016-04-28 MED ORDER — GLUCOSE BLOOD VI STRP: ORAL_STRIP | 12 refills | Status: DC

## 2016-04-28 MED ORDER — ALBUTEROL SULFATE HFA 108 (90 BASE) MCG/ACT IN AERS: 2.0000 | INHALATION_SPRAY | Freq: Four times a day (QID) | RESPIRATORY_TRACT | 2 refills | Status: DC | PRN

## 2016-04-28 MED ORDER — ACCU-CHEK AVIVA PLUS W/DEVICE KIT: PACK | 0 refills | Status: DC

## 2016-04-28 MED ORDER — LOSARTAN POTASSIUM 100 MG PO TABS: 100.0000 mg | ORAL_TABLET | Freq: Every day | ORAL | 3 refills | Status: DC

## 2016-04-28 MED ORDER — ACCU-CHEK SOFTCLIX LANCETS MISC: 12 refills | Status: DC

## 2016-04-28 MED ORDER — HYDRALAZINE HCL 25 MG PO TABS: 25.0000 mg | ORAL_TABLET | Freq: Three times a day (TID) | ORAL | 3 refills | Status: DC

## 2016-04-28 MED ORDER — AMLODIPINE BESYLATE 10 MG PO TABS: 10.0000 mg | ORAL_TABLET | Freq: Every day | ORAL | 3 refills | Status: DC

## 2016-04-28 MED ORDER — ONETOUCH ULTRA SYSTEM W/DEVICE KIT: 1.0000 | PACK | Freq: Once | 0 refills | Status: DC

## 2016-04-28 MED ORDER — METFORMIN HCL 1000 MG PO TABS: 1000.0000 mg | ORAL_TABLET | Freq: Two times a day (BID) | ORAL | 3 refills | Status: DC

## 2016-04-28 MED ORDER — ATORVASTATIN CALCIUM 20 MG PO TABS: 20.0000 mg | ORAL_TABLET | Freq: Every day | ORAL | 2 refills | Status: DC

## 2016-04-28 NOTE — Patient Instructions (Signed)
Check blood sugar fasting and at bedtime and record and bring to next visit.  Check blood pressure at least 3 X/week and record and bring to next visit.
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
## 2016-04-28 NOTE — Progress Notes (Signed)
Pt is in the office today for hypertension Pt states she is in pain everyday Pt states her pain level is a 6 today in the office Pt states her pain is coming from her back

## 2016-04-28 NOTE — Progress Notes (Unsigned)
.  acc

## 2016-05-04 ENCOUNTER — Telehealth: Payer: Self-pay

## 2016-05-04 NOTE — Telephone Encounter (Signed)
Contacted pt to go over lab results pt is aware of results and doesn't have any questions or concerns 

## 2016-05-18 ENCOUNTER — Encounter: Payer: Self-pay | Admitting: Podiatry

## 2016-05-18 ENCOUNTER — Ambulatory Visit (INDEPENDENT_AMBULATORY_CARE_PROVIDER_SITE_OTHER): Payer: Medicare HMO | Admitting: Podiatry

## 2016-05-18 VITALS — BP 173/92 | HR 91 | Resp 18

## 2016-05-18 DIAGNOSIS — B351 Tinea unguium: Secondary | ICD-10-CM | POA: Diagnosis not present

## 2016-05-18 MED FILL — ACCU-CHEK AVIVA PLUS METER: W/DEVICE | 30 days supply | Qty: 1 | Fill #0

## 2016-05-18 MED FILL — ACCU-CHEK AVIVA PLUS TEST S: 25 days supply | Qty: 100 | Fill #0

## 2016-05-18 MED FILL — ACCU-CHEK SOFTCLIX LANCETS: 25 days supply | Qty: 100 | Fill #0

## 2016-05-18 NOTE — Progress Notes (Signed)
   Subjective:    Patient ID: [name], female    DOB: [birth year], 65 y.o.   MRN: [id number]  HPI    As patient presents today describing a 1 month history of the left great toenail loosening after patient hit left toenail area against a table. Patient has not noticed any drainage, warmth, fever surrounding this site. She noticed the nail has loosened slightly after this minor trauma. Patient has a proximally 1 week history of noticing a small circular dark area on the medial left heel. The lesion has not increased in size or generalized appearance that she noticed this area.  Patient is diabetic and denies any history of foot ulceration, claudication or amputation     Review of Systems  Constitutional: Positive for fatigue.  HENT: Positive for sinus pressure.   Respiratory: Positive for cough and wheezing.   Cardiovascular: Positive for palpitations and leg swelling.  Gastrointestinal: Positive for constipation and vomiting.  Genitourinary: Positive for urgency.  Musculoskeletal: Positive for back pain and gait problem.  Neurological: Positive for headaches.  Hematological: Bruises/bleeds easily.  All other systems reviewed and are negative.      Objective:   Physical Exam  Orientated 3  Vascular: DP pulses 2/4 bilaterally PT pulses trace palpable bilaterally Capillary reflex immediate bilaterally  Neurological: Sensation to 10 g monofilament wire intact 5/5 bilaterally Vibratory sensation nonreactive bilaterally Ankle reflexes equal and reactive bilaterally  Dermatological: No open skin lesions bilaterally Is a 5 mm sharply circumscribed area of dried blood in the medial left heel. General debridement of this reveals some dried blood beneath the lesion. There is no surrounding erythema, edema, warmth or inflammatory reactions around this patch of dried blood on the medial left heel Scar anterior medial right ankle The toenails diffusely are hypertrophic,  discolored, brittle with slight loosening of the left hallux toenail. There is no surrounding erythema, edema, drainage, more frontal left hallux nail  Musculoskeletal: Hammertoe second bilaterally Manual motor testing dorsi flexion, plantar flexion, inversion, eversion 5/5 bilaterally       Assessment & Plan:   Assessment: Diabetic with decreased pulses suggestive possible performed to disease Diabetic peripheral neuropathy Mycotic toenails including left hallux nail Small area of dried blood on medial left heel  Plan: I reviewed the results of exam with patient today. Informed at all the nail nails were mycotic in nature including the left great toenail and I recommended debridement of the left great toenail. Also, informed patient that the lesion on the left heel appears to be dried blood and no active treatment indicated this time. This area should eventually heal in slough and regrow normal skin. Return if this does not happen Toenails 1-5 are debrided mechanically and left without any bleeding or drainage with particular attention the left hallux nail  Reappoint at patient's request or yearly

## 2016-05-18 NOTE — Patient Instructions (Signed)
Today I trim back the thickened left great toenail which was becoming detached from the nailbed because of a fungal infection. The nail will grow back gradually over 4-6 months. Return as needed or at least yearly  Diabetes and Foot Care Diabetes may cause you to have problems because of poor blood supply (circulation) to your feet and legs. This may cause the skin on your feet to become thinner, break easier, and heal more slowly. Your skin may become dry, and the skin may peel and crack. You may also have nerve damage in your legs and feet causing decreased feeling in them. You may not notice minor injuries to your feet that could lead to infections or more serious problems. Taking care of your feet is one of the most important things you can do for yourself. Follow these instructions at home:  Wear shoes at all times, even in the house. Do not go barefoot. Bare feet are easily injured.  Check your feet daily for blisters, cuts, and redness. If you cannot see the bottom of your feet, use a mirror or ask someone for help.  Wash your feet with warm water (do not use hot water) and mild soap. Then pat your feet and the areas between your toes until they are completely dry. Do not soak your feet as this can dry your skin.  Apply a moisturizing lotion or petroleum jelly (that does not contain alcohol and is unscented) to the skin on your feet and to dry, brittle toenails. Do not apply lotion between your toes.  Trim your toenails straight across. Do not dig under them or around the cuticle. File the edges of your nails with an emery board or nail file.  Do not cut corns or calluses or try to remove them with medicine.  Wear clean socks or stockings every day. Make sure they are not too tight. Do not wear knee-high stockings since they may decrease blood flow to your legs.  Wear shoes that fit properly and have enough cushioning. To break in new shoes, wear them for just a few hours a day. This  prevents you from injuring your feet. Always look in your shoes before you put them on to be sure there are no objects inside.  Do not cross your legs. This may decrease the blood flow to your feet.  If you find a minor scrape, cut, or break in the skin on your feet, keep it and the skin around it clean and dry. These areas may be cleansed with mild soap and water. Do not cleanse the area with peroxide, alcohol, or iodine.  When you remove an adhesive bandage, be sure not to damage the skin around it.  If you have a wound, look at it several times a day to make sure it is healing.  Do not use heating pads or hot water bottles. They may burn your skin. If you have lost feeling in your feet or legs, you may not know it is happening until it is too late.  Make sure your health care provider performs a complete foot exam at least annually or more often if you have foot problems. Report any cuts, sores, or bruises to your health care provider immediately. Contact a health care provider if:  You have an injury that is not healing.  You have cuts or breaks in the skin.  You have an ingrown nail.  You notice redness on your legs or feet.  You feel burning or tingling  in your legs or feet.  You have pain or cramps in your legs and feet.  Your legs or feet are numb.  Your feet always feel cold. Get help right away if:  There is increasing redness, swelling, or pain in or around a wound.  There is a red line that goes up your leg.  Pus is coming from a wound.  You develop a fever or as directed by your health care provider.  You notice a bad smell coming from an ulcer or wound. This information is not intended to replace advice given to you by your health care provider. Make sure you discuss any questions you have with your health care provider. Document Released: 06/10/2000 Document Revised: 11/19/2015 Document Reviewed: 11/20/2012 Elsevier Interactive Patient Education  2017  [employer].

## 2016-05-23 ENCOUNTER — Ambulatory Visit: Payer: Medicare HMO | Attending: Family Medicine | Admitting: Family Medicine

## 2016-05-23 ENCOUNTER — Encounter: Payer: Self-pay | Admitting: Licensed Clinical Social Worker

## 2016-05-23 ENCOUNTER — Encounter: Payer: Self-pay | Admitting: Family Medicine

## 2016-05-23 VITALS — BP 180/90 | HR 73 | Temp 98.1°F | Resp 16 | Ht 67.75 in | Wt 245.4 lb

## 2016-05-23 DIAGNOSIS — E870 Hyperosmolality and hypernatremia: Secondary | ICD-10-CM | POA: Insufficient documentation

## 2016-05-23 DIAGNOSIS — M549 Dorsalgia, unspecified: Secondary | ICD-10-CM | POA: Diagnosis present

## 2016-05-23 DIAGNOSIS — M545 Low back pain: Secondary | ICD-10-CM | POA: Diagnosis not present

## 2016-05-23 DIAGNOSIS — Z0000 Encounter for general adult medical examination without abnormal findings: Secondary | ICD-10-CM

## 2016-05-23 DIAGNOSIS — I10 Essential (primary) hypertension: Secondary | ICD-10-CM | POA: Diagnosis not present

## 2016-05-23 DIAGNOSIS — E0800 Diabetes mellitus due to underlying condition with hyperosmolarity without nonketotic hyperglycemic-hyperosmolar coma (NKHHC): Secondary | ICD-10-CM | POA: Diagnosis not present

## 2016-05-23 DIAGNOSIS — Z7982 Long term (current) use of aspirin: Secondary | ICD-10-CM | POA: Diagnosis not present

## 2016-05-23 DIAGNOSIS — G4700 Insomnia, unspecified: Secondary | ICD-10-CM | POA: Diagnosis not present

## 2016-05-23 DIAGNOSIS — E119 Type 2 diabetes mellitus without complications: Secondary | ICD-10-CM | POA: Insufficient documentation

## 2016-05-23 DIAGNOSIS — G8929 Other chronic pain: Secondary | ICD-10-CM

## 2016-05-23 DIAGNOSIS — Z7984 Long term (current) use of oral hypoglycemic drugs: Secondary | ICD-10-CM | POA: Insufficient documentation

## 2016-05-23 DIAGNOSIS — F329 Major depressive disorder, single episode, unspecified: Secondary | ICD-10-CM | POA: Diagnosis not present

## 2016-05-23 DIAGNOSIS — F32A Depression, unspecified: Secondary | ICD-10-CM

## 2016-05-23 LAB — GLUCOSE, POCT (MANUAL RESULT ENTRY): POC Glucose: 239 mg/dl — AB (ref 70–99)

## 2016-05-23 MED ORDER — GLUCOSE BLOOD VI STRP: ORAL_STRIP | 12 refills | Status: DC

## 2016-05-23 MED ORDER — TRAMADOL HCL 50 MG PO TABS: 50.0000 mg | ORAL_TABLET | Freq: Three times a day (TID) | ORAL | 0 refills | Status: DC | PRN

## 2016-05-23 MED ORDER — BUPROPION HCL ER (XL) 150 MG PO TB24: 150.0000 mg | ORAL_TABLET | Freq: Every day | ORAL | 0 refills | Status: DC

## 2016-05-23 MED ORDER — ACCU-CHEK SOFTCLIX LANCETS MISC: 12 refills | Status: DC

## 2016-05-23 MED ORDER — TRAZODONE HCL 50 MG PO TABS: 25.0000 mg | ORAL_TABLET | Freq: Every evening | ORAL | 0 refills | Status: DC | PRN

## 2016-05-23 MED ORDER — HYDRALAZINE HCL 25 MG PO TABS: 50.0000 mg | ORAL_TABLET | Freq: Three times a day (TID) | ORAL | 3 refills | Status: DC

## 2016-05-23 MED ORDER — ACCU-CHEK AVIVA PLUS W/DEVICE KIT: PACK | 0 refills | Status: DC

## 2016-05-23 MED FILL — BUPROPION HCL XL 150 MG TAB: 150 | 30 days supply | Qty: 30 | Fill #0

## 2016-05-23 MED FILL — ACCU-CHEK AVIVA PLUS METER: W/DEVICE | 1 days supply | Qty: 1 | Fill #0

## 2016-05-23 MED FILL — traZODone HCL 50 MG TABS: 50 | 60 days supply | Qty: 30 | Fill #0

## 2016-05-23 NOTE — Progress Notes (Signed)
Pt is being seen for back pain Pt needs a refill on the Trazodone to help her sleep Pt needs something stronger for pain Pt wants to know if her glucose monitor and strips will be ready for her today Pt also needs a refill on Trajenta Pt CBG 239

## 2016-05-23 NOTE — Patient Instructions (Signed)
[address], [place] [address] Phone: [phone]
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
## 2016-05-24 LAB — HEPATITIS C ANTIBODY: HCV AB: NEGATIVE

## 2016-05-24 MED ORDER — LINAGLIPTIN 5 MG PO TABS: 5.0000 mg | ORAL_TABLET | Freq: Every day | ORAL | 3 refills | Status: DC

## 2016-05-24 MED FILL — TRADJENTA 5 MG TABLET: 5 | 30 days supply | Qty: 30 | Fill #0
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
## 2016-05-27 ENCOUNTER — Telehealth: Payer: Self-pay

## 2016-05-27 NOTE — Telephone Encounter (Addendum)
Left message requested return call to [facility].   Attempted to advise patient per Toy Baker, FNP: Hepatitis C is negative. Follow a low sodium/carbohydrate modified diet.  Return in about 1 month for Low back pain, depression, and insomnia follow up.

## 2016-05-31 NOTE — Telephone Encounter (Addendum)
Patient fully hipaa verif;  RN advised patient per Alfonse Spruce, FNP-Hepatitis C is negative. Follow a low sodium/carbohydrate modified diet.  Return in about 1 month for Low back pain, depression, and insomnia follow up.  Also reminded to schedule BP check as discussed at visit.  Patient verbalized understanding.  Priscille Heidelberg, RN, BSN

## 2016-06-17 ENCOUNTER — Other Ambulatory Visit: Payer: Self-pay | Admitting: Family Medicine

## 2016-06-17 DIAGNOSIS — Z1231 Encounter for screening mammogram for malignant neoplasm of breast: Secondary | ICD-10-CM

## 2016-07-18 ENCOUNTER — Telehealth: Payer: Self-pay | Admitting: Family Medicine

## 2016-07-18 ENCOUNTER — Ambulatory Visit: Payer: Medicare HMO

## 2016-07-18 DIAGNOSIS — F329 Major depressive disorder, single episode, unspecified: Secondary | ICD-10-CM

## 2016-07-18 DIAGNOSIS — G4700 Insomnia, unspecified: Secondary | ICD-10-CM

## 2016-07-18 DIAGNOSIS — F32A Depression, unspecified: Secondary | ICD-10-CM

## 2016-07-18 MED ORDER — BUPROPION HCL ER (XL) 150 MG PO TB24: 150.0000 mg | ORAL_TABLET | Freq: Every day | ORAL | 0 refills | Status: DC

## 2016-07-18 MED ORDER — TRAZODONE HCL 50 MG PO TABS: 25.0000 mg | ORAL_TABLET | Freq: Every evening | ORAL | 0 refills | Status: DC | PRN

## 2016-07-18 MED FILL — traZODone HCL 50 MG TABS: 50 | 30 days supply | Qty: 15 | Fill #0

## 2016-07-18 MED FILL — BUPROPION HCL XL 150 MG TAB: 150 | 30 days supply | Qty: 30 | Fill #0

## 2016-07-18 NOTE — Telephone Encounter (Signed)
Requested medications refilled x 30 days - patient needs office visit to follow up with [facility]

## 2016-07-18 NOTE — Telephone Encounter (Signed)
Pt. Called requesting a refill on the following medications:  traZODone (DESYREL) 50 MG tablet   buPROPion (WELLBUTRIN XL) 150 MG 24 hr tablet  Pt. Would like her medication to be sent to [facility] pharmacy. Please f/u with pt.

## 2016-07-28 LAB — HM DIABETES EYE EXAM

## 2016-08-16 ENCOUNTER — Other Ambulatory Visit: Payer: Self-pay | Admitting: Internal Medicine

## 2016-08-24 ENCOUNTER — Other Ambulatory Visit: Payer: Self-pay | Admitting: Internal Medicine

## 2016-08-24 ENCOUNTER — Other Ambulatory Visit: Payer: Self-pay | Admitting: Family Medicine

## 2016-08-24 DIAGNOSIS — F329 Major depressive disorder, single episode, unspecified: Secondary | ICD-10-CM

## 2016-08-24 DIAGNOSIS — G8929 Other chronic pain: Secondary | ICD-10-CM

## 2016-08-24 DIAGNOSIS — M545 Low back pain, unspecified: Secondary | ICD-10-CM

## 2016-08-24 DIAGNOSIS — F32A Depression, unspecified: Secondary | ICD-10-CM

## 2016-08-24 DIAGNOSIS — G4700 Insomnia, unspecified: Secondary | ICD-10-CM

## 2016-08-25 ENCOUNTER — Telehealth: Payer: Self-pay

## 2016-08-25 MED FILL — BUPROPION HCL XL 150 MG TAB: 150 | 30 days supply | Qty: 30 | Fill #0

## 2016-08-25 MED FILL — traZODone HCL 50 MG TABS: 50 | 30 days supply | Qty: 15 | Fill #0

## 2016-08-25 NOTE — Telephone Encounter (Signed)
CMA call to inform patient she has RX to pickup at front desk & pharmacy also that for an additional refill she needs to be seen at the office for an evaluation and to sign the CSA form   Patient was aware and understood

## 2016-08-25 NOTE — Telephone Encounter (Signed)
If patient is having chronic back pain she need to make an appointment to be re-evaluated. She would also need to sign a controlled substance agreement form for additional refills. I will refill for only Tramadol (20 count) until she is able to schedule an appointment to be seen.

## 2016-09-26 ENCOUNTER — Encounter: Payer: Self-pay | Admitting: Family Medicine

## 2016-09-26 ENCOUNTER — Ambulatory Visit: Payer: Medicare PPO | Attending: Family Medicine | Admitting: Family Medicine

## 2016-09-26 VITALS — BP 141/80 | HR 84 | Temp 98.7°F | Resp 18 | Ht 67.0 in | Wt 234.0 lb

## 2016-09-26 DIAGNOSIS — M545 Low back pain: Secondary | ICD-10-CM | POA: Diagnosis not present

## 2016-09-26 DIAGNOSIS — G8929 Other chronic pain: Secondary | ICD-10-CM

## 2016-09-26 DIAGNOSIS — Z7984 Long term (current) use of oral hypoglycemic drugs: Secondary | ICD-10-CM | POA: Diagnosis not present

## 2016-09-26 DIAGNOSIS — G4700 Insomnia, unspecified: Secondary | ICD-10-CM | POA: Diagnosis not present

## 2016-09-26 DIAGNOSIS — F329 Major depressive disorder, single episode, unspecified: Secondary | ICD-10-CM | POA: Diagnosis not present

## 2016-09-26 DIAGNOSIS — I10 Essential (primary) hypertension: Secondary | ICD-10-CM

## 2016-09-26 DIAGNOSIS — E0800 Diabetes mellitus due to underlying condition with hyperosmolarity without nonketotic hyperglycemic-hyperosmolar coma (NKHHC): Secondary | ICD-10-CM

## 2016-09-26 DIAGNOSIS — Z7982 Long term (current) use of aspirin: Secondary | ICD-10-CM | POA: Diagnosis not present

## 2016-09-26 DIAGNOSIS — E119 Type 2 diabetes mellitus without complications: Secondary | ICD-10-CM | POA: Insufficient documentation

## 2016-09-26 DIAGNOSIS — Z0000 Encounter for general adult medical examination without abnormal findings: Secondary | ICD-10-CM

## 2016-09-26 DIAGNOSIS — E1165 Type 2 diabetes mellitus with hyperglycemia: Secondary | ICD-10-CM

## 2016-09-26 DIAGNOSIS — F32A Depression, unspecified: Secondary | ICD-10-CM

## 2016-09-26 LAB — GLUCOSE, POCT (MANUAL RESULT ENTRY): POC GLUCOSE: 226 mg/dL — AB (ref 70–99)

## 2016-09-26 LAB — POCT GLYCOSYLATED HEMOGLOBIN (HGB A1C): HEMOGLOBIN A1C: 8.3

## 2016-09-26 MED ORDER — IBUPROFEN 800 MG PO TABS: 800.0000 mg | ORAL_TABLET | Freq: Three times a day (TID) | ORAL | 0 refills | Status: DC | PRN

## 2016-09-26 MED ORDER — HYDROCHLOROTHIAZIDE 25 MG PO TABS: 50.0000 mg | ORAL_TABLET | Freq: Every day | ORAL | 2 refills | Status: DC

## 2016-09-26 MED ORDER — BUPROPION HCL ER (XL) 150 MG PO TB24: 150.0000 mg | ORAL_TABLET | Freq: Every day | ORAL | 0 refills | Status: DC

## 2016-09-26 MED ORDER — TRAZODONE HCL 50 MG PO TABS: 25.0000 mg | ORAL_TABLET | Freq: Every evening | ORAL | 0 refills | Status: DC | PRN

## 2016-09-26 MED ORDER — TRAMADOL HCL 50 MG PO TABS: 50.0000 mg | ORAL_TABLET | Freq: Three times a day (TID) | ORAL | 0 refills | Status: DC | PRN

## 2016-09-26 NOTE — Patient Instructions (Addendum)
Schedule follow up with clinical pharmacist in 2 weeks for Diabetes and Hypertension. Start taking blood sugars twice a day, am and pm. Start taking BP 3 times a week.  Bring log of blood sugars and blood pressure to follow up in appointment 2 weeks.   Hypertension Hypertension is another name for high blood pressure. High blood pressure forces your heart to work harder to pump blood. This can cause problems over time. There are two numbers in a blood pressure reading. There is a top number (systolic) over a bottom number (diastolic). It is best to have a blood pressure below 120/80. Healthy choices can help lower your blood pressure. You may need medicine to help lower your blood pressure if:  Your blood pressure cannot be lowered with healthy choices.  Your blood pressure is higher than 130/80. Follow these instructions at home: Eating and drinking   If directed, follow the DASH eating plan. This diet includes:  Filling half of your plate at each meal with fruits and vegetables.  Filling one quarter of your plate at each meal with whole grains. Whole grains include whole wheat pasta, brown rice, and whole grain bread.  Eating or drinking low-fat dairy products, such as skim milk or low-fat yogurt.  Filling one quarter of your plate at each meal with low-fat (lean) proteins. Low-fat proteins include fish, skinless chicken, eggs, beans, and tofu.  Avoiding fatty meat, cured and processed meat, or chicken with skin.  Avoiding premade or processed food.  Eat less than 1,500 mg of salt (sodium) a day.  Limit alcohol use to no more than 1 drink a day for nonpregnant women and 2 drinks a day for men. One drink equals 12 oz of beer, 5 oz of wine, or 1 oz of hard liquor. Lifestyle   Work with your doctor to stay at a healthy weight or to lose weight. Ask your doctor what the best weight is for you.  Get at least 30 minutes of exercise that causes your heart to beat faster (aerobic  exercise) most days of the week. This may include walking, swimming, or biking.  Get at least 30 minutes of exercise that strengthens your muscles (resistance exercise) at least 3 days a week. This may include lifting weights or pilates.  Do not use any products that contain nicotine or tobacco. This includes cigarettes and e-cigarettes. If you need help quitting, ask your doctor.  Check your blood pressure at home as told by your doctor.  Keep all follow-up visits as told by your doctor. This is important. Medicines   Take over-the-counter and prescription medicines only as told by your doctor. Follow directions carefully.  Do not skip doses of blood pressure medicine. The medicine does not work as well if you skip doses. Skipping doses also puts you at risk for problems.  Ask your doctor about side effects or reactions to medicines that you should watch for. Contact a doctor if:  You think you are having a reaction to the medicine you are taking.  You have headaches that keep coming back (recurring).  You feel dizzy.  You have swelling in your ankles.  You have trouble with your vision. Get help right away if:  You get a very bad headache.  You start to feel confused.  You feel weak or numb.  You feel faint.  You get very bad pain in your:  Chest.  Belly (abdomen).  You throw up (vomit) more than once.  You have trouble breathing.  Summary  Hypertension is another name for high blood pressure.  Making healthy choices can help lower blood pressure. If your blood pressure cannot be controlled with healthy choices, you may need to take medicine. This information is not intended to replace advice given to you by your health care provider. Make sure you discuss any questions you have with your health care provider. Document Released: 11/30/2007 Document Revised: 05/11/2016 Document Reviewed: 05/11/2016 Elsevier Interactive Patient Education  2017 [employer].

## 2016-09-26 NOTE — Progress Notes (Signed)
Patient is here for  lower back pain  Patient ha snot taking her meds for today  Patient has eaten for today

## 2016-09-26 NOTE — Progress Notes (Signed)
Subjective:  Patient ID: [name], female    DOB: 12/17/1950  Age: 66 y.o. MRN: [id number]  CC: No chief complaint on file.   HPI KEATYN LUCK presents for  HTN: Reports not being adherent with taking blood pressure medications daily. Denies any CP, SOB, swelling of the BLE, or dizziness.   DM: She reports not taking daily blood glucose. She reports taking CBG 1 week ago. Reports opthalmologic appointment last week and cataracts were removed.  Chronic lower back pain: Reports pain on average is 7/10. Denies any spasms. Aggravated by sitting or standing. History of surgical back interventions in the past. Reports taking Tramadol and ibuprofen for pain for relief of symptoms.  Depression: Reports improvement of depression and insomnia symptoms on current medication and dose. Denies any SI/HI. Reports 7 hours of sleep most nights.  Outpatient Medications Prior to Visit  Medication Sig Dispense Refill  . ACCU-CHEK SOFTCLIX LANCETS lancets Use as instructed 100 each 12  . albuterol (PROVENTIL HFA;VENTOLIN HFA) 108 (90 Base) MCG/ACT inhaler Inhale 2 puffs into the lungs every 6 (six) hours as needed for wheezing or shortness of breath. 1 Inhaler 2  . amLODipine (NORVASC) 10 MG tablet Take 1 tablet (10 mg total) by mouth daily. 90 tablet 3  . aspirin EC 81 MG tablet Take 1 tablet (81 mg total) by mouth daily. 30 tablet 5  . Blood Glucose Monitoring Suppl (ACCU-CHEK AVIVA PLUS) w/Device KIT Use as directed 1 kit 0  . docusate sodium (COLACE) 100 MG capsule Take 1 capsule (100 mg total) by mouth 2 (two) times daily. (Patient not taking: Reported on 05/23/2016) 60 capsule 0  . glucose blood (ACCU-CHEK AVIVA PLUS) test strip Use as instructed 100 each 12  . linagliptin (TRADJENTA) 5 MG TABS tablet Take 1 tablet (5 mg total) by mouth daily. 30 tablet 3  . losartan (COZAAR) 100 MG tablet Take 1 tablet (100 mg total) by mouth daily. 90 tablet 3  . metFORMIN (GLUCOPHAGE) 1000 MG tablet  Take 1 tablet (1,000 mg total) by mouth 2 (two) times daily with a meal. 180 tablet 3  . oxyCODONE-acetaminophen (PERCOCET) 10-325 MG tablet Take 1 tablet by mouth every 4 (four) hours as needed for pain.     [name] oxyCODONE-acetaminophen (PERCOCET) 10-325 MG tablet Take 1 tablet by mouth every 4 (four) hours as needed for pain. (Patient not taking: Reported on 05/23/2016) 100 tablet 0  . pantoprazole (PROTONIX) 40 MG tablet TAKE 1 TABLET EVERY DAY 90 tablet 0  . atorvastatin (LIPITOR) 20 MG tablet Take 1 tablet (20 mg total) by mouth daily. 90 tablet 2  . buPROPion (WELLBUTRIN XL) 150 MG 24 hr tablet TAKE 1 TABLET BY MOUTH DAILY 30 tablet 0  . hydrALAZINE (APRESOLINE) 25 MG tablet Take 2 tablets (50 mg total) by mouth 3 (three) times daily. 270 tablet 3  . traMADol (ULTRAM) 50 MG tablet Take 1 tablet (50 mg total) by mouth every 8 (eight) hours as needed for moderate pain or severe pain. 20 tablet 0  . traZODone (DESYREL) 50 MG tablet TAKE 1/2 TABLET BY MOUTH AT BEDTIME AS NEEDED FOR SLEEP 15 tablet 0   No facility-administered medications prior to visit.     ROS Review of Systems  Eyes: Negative.   Respiratory: Negative.   Cardiovascular: Negative.   Gastrointestinal: Negative.   Musculoskeletal: Positive for back pain (chronic).  Psychiatric/Behavioral:       History of depression.    Objective:  BP (!) 141/80 (  BP Location: Right Arm, Patient Position: Sitting, Cuff Size: Normal)   Pulse 84   Temp 98.7 F (37.1 C) (Oral)   Resp 18   Ht 5' 7" (1.702 m)   Wt 234 lb (106.1 kg)   SpO2 99%   BMI 36.65 kg/m   BP/Weight 09/26/2016 05/23/2016 09/32/3557  Systolic BP 322 025 427  Diastolic BP 80 90 92  Wt. (Lbs) 234 245.4 -  BMI 36.65 37.59 -    Physical Exam  Eyes: Conjunctivae are normal. Pupils are equal, round, and reactive to light.  Neck: Normal range of motion. No JVD present.  Cardiovascular: Normal rate, regular rhythm, normal heart sounds and intact distal pulses.     Pulmonary/Chest: Effort normal and breath sounds normal.  Abdominal: Soft. Bowel sounds are normal.  Skin: Skin is warm and dry.  Psychiatric: She has a normal mood and affect.  Nursing note and vitals reviewed.   Depression screen [facility] 2/9 09/26/2016 05/23/2016 04/28/2016  Decreased Interest 0 0 0  Down, Depressed, Hopeless 2 0 1  PHQ - 2 Score 2 0 1  Altered sleeping 3 0 -  Tired, decreased energy 2 0 -  Change in appetite 0 0 -  Feeling bad or failure about yourself  0 0 -  Trouble concentrating 0 0 -  Moving slowly or fidgety/restless 0 0 -  Suicidal thoughts 0 0 -  PHQ-9 Score 7 0 -    GAD 7 : Generalized Anxiety Score 09/26/2016 05/23/2016 04/28/2016  Nervous, Anxious, on Edge 0 0 0  Control/stop worrying 0 0 1  Worry too much - different things 0 0 2  Trouble relaxing 0 0 1  Restless 0 0 0  Easily annoyed or irritable 2 0 1  Afraid - awful might happen 0 0 0  Total GAD 7 Score 2 0 5     Assessment & Plan:   Problem List Items Addressed This Visit      Cardiovascular and Mediastinum   Essential hypertension   Start taking BP TID. Bring log of BP to follow up appointment in 2 weeks.        Follow up in  3 months w/ PCP for HTN/ DM /Depression   Relevant Medications   hydrochlorothiazide (HYDRODIURIL) 25 MG tablet   Other Relevant Orders   Lipid Panel (Completed)   CMP14+EGFR (Completed)   Microalbumin/Creatinine Ratio, Urine (Completed)     Endocrine   Diabetes ([place]) - Primary   Start taking blood sugars BID. Bring log of CBG to follow up appointment in 2 weeks.   Relevant Orders   Glucose (CBG) (Completed)   HgB A1c (Completed)   Lipid Panel (Completed)   CMP14+EGFR (Completed)    Other Visit Diagnoses    Chronic bilateral low back pain without sciatica       Relevant Medications   traMADol (ULTRAM) 50 MG tablet   ibuprofen (ADVIL,MOTRIN) 800 MG tablet   Other Relevant Orders   Ambulatory referral to Orthopedics   DG Lumbar Spine Complete    Depression, unspecified depression type       Relevant Medications   buPROPion (WELLBUTRIN XL) 150 MG 24 hr tablet   traZODone (DESYREL) 50 MG tablet   Healthcare maintenance       Relevant Orders   MM SCREENING BREAST TOMO BILATERAL   Insomnia, unspecified type       Relevant Medications   traZODone (DESYREL) 50 MG tablet    Meds ordered this encounter  Medications  .  traMADol (ULTRAM) 50 MG tablet    Sig: Take 1 tablet (50 mg total) by mouth every 8 (eight) hours as needed for moderate pain or severe pain.    Dispense:  30 tablet    Refill:  0    Order Specific Question:   Supervising Provider    Answer:   Tresa Garter [id number]  . ibuprofen (ADVIL,MOTRIN) 800 MG tablet    Sig: Take 1 tablet (800 mg total) by mouth every 8 (eight) hours as needed for moderate pain or cramping.    Dispense:  40 tablet    Refill:  0    Order Specific Question:   Supervising Provider    Answer:   Tresa Garter [id number]  . buPROPion (WELLBUTRIN XL) 150 MG 24 hr tablet    Sig: Take 1 tablet (150 mg total) by mouth daily.    Dispense:  90 tablet    Refill:  0    Order Specific Question:   Supervising Provider    Answer:   Tresa Garter [id number]  . hydrochlorothiazide (HYDRODIURIL) 25 MG tablet    Sig: Take 2 tablets (50 mg total) by mouth daily.    Dispense:  60 tablet    Refill:  2    Order Specific Question:   Supervising Provider    Answer:   Tresa Garter [id number]  . traZODone (DESYREL) 50 MG tablet    Sig: Take 0.5 tablets (25 mg total) by mouth at bedtime as needed for sleep.    Dispense:  60 tablet    Refill:  0    Order Specific Question:   Supervising Provider    Answer:   Tresa Garter [[id number]]    Follow-up: Return in about 2 weeks (around 10/10/2016) for BP & HTN with clinical pharmacist.     Alfonse Spruce FNP

## 2016-09-27 ENCOUNTER — Telehealth: Payer: Self-pay

## 2016-09-27 ENCOUNTER — Other Ambulatory Visit: Payer: Self-pay | Admitting: Family Medicine

## 2016-09-27 DIAGNOSIS — N183 Chronic kidney disease, stage 3 unspecified: Secondary | ICD-10-CM

## 2016-09-27 DIAGNOSIS — E782 Mixed hyperlipidemia: Secondary | ICD-10-CM

## 2016-09-27 LAB — CMP14+EGFR
ALBUMIN: 4.3 g/dL (ref 3.6–4.8)
ALK PHOS: 66 IU/L (ref 39–117)
ALT: 11 IU/L (ref 0–32)
AST: 19 IU/L (ref 0–40)
Albumin/Globulin Ratio: 1.7 (ref 1.2–2.2)
BUN/Creatinine Ratio: 22 (ref 12–28)
BUN: 26 mg/dL (ref 8–27)
Bilirubin Total: 0.3 mg/dL (ref 0.0–1.2)
CALCIUM: 9.1 mg/dL (ref 8.7–10.3)
CHLORIDE: 101 mmol/L (ref 96–106)
CO2: 24 mmol/L (ref 18–29)
Creatinine, Ser: 1.17 mg/dL — ABNORMAL HIGH (ref 0.57–1.00)
GFR calc Af Amer: 56 mL/min/{1.73_m2} — ABNORMAL LOW (ref >59)
GFR calc non Af Amer: 48 mL/min/{1.73_m2} — ABNORMAL LOW (ref >59)
GLOBULIN, TOTAL: 2.5 g/dL (ref 1.5–4.5)
GLUCOSE: 196 mg/dL — AB (ref 65–99)
POTASSIUM: 4.4 mmol/L (ref 3.5–5.2)
Sodium: 139 mmol/L (ref 134–144)
Total Protein: 6.8 g/dL (ref 6.0–8.5)

## 2016-09-27 LAB — MICROALBUMIN / CREATININE URINE RATIO
Creatinine, Urine: 112.1 mg/dL
MICROALB/CREAT RATIO: 566.8 mg/g{creat} — AB (ref 0.0–30.0)
MICROALBUM., U, RANDOM: 635.4 ug/mL

## 2016-09-27 LAB — LIPID PANEL
Chol/HDL Ratio: 2.7 ratio (ref 0.0–4.4)
Cholesterol, Total: 247 mg/dL — ABNORMAL HIGH (ref 100–199)
HDL: 91 mg/dL (ref >39)
LDL Calculated: 119 mg/dL — ABNORMAL HIGH (ref 0–99)
Triglycerides: 187 mg/dL — ABNORMAL HIGH (ref 0–149)
VLDL CHOLESTEROL CAL: 37 mg/dL (ref 5–40)

## 2016-09-27 MED ORDER — ATORVASTATIN CALCIUM 40 MG PO TABS: 40.0000 mg | ORAL_TABLET | Freq: Every day | ORAL | 0 refills | Status: DC

## 2016-09-27 MED FILL — ATORVASTATIN 40 MG TABLET: 40 | 90 days supply | Qty: 90 | Fill #0

## 2016-09-27 NOTE — Telephone Encounter (Signed)
-----   Message from Alfonse Spruce, [place] sent at 09/27/2016  4:39 PM EDT ----- -Kidney function is stable since last check but is still decreased. Levels indicate you have chronic kidney disease stage 3. Take your medications for blood pressure consistently, avoid taking NSAID medications, reduce salt intake to 2 to 4 grams/day, do not smoke. -Recommend monitoring again in 6 months. If your levels have signifcantly increased you will be referred to nephrology. -Microalbumin/creatinine ratio level was elevated. This tests for protein in your urine that could indicate early signs of kidney damage. Take your medications for blood pressure consistently. Recommend recheck in 3 months. -Lipid levels were elevated. This can increase your risk of heart disease. Your dose of atorvastatin has been increased. Recommend recheck in 3 months.

## 2016-09-27 NOTE — Telephone Encounter (Signed)
CMA call to inform patient about results  Patient Verify DOB  Patient was aware and understood

## 2016-10-11 ENCOUNTER — Ambulatory Visit: Payer: Self-pay | Admitting: Pharmacist

## 2016-11-16 LAB — GLUCOSE, POCT (MANUAL RESULT ENTRY): POC GLUCOSE: 198 mg/dL — AB (ref 70–99)

## 2017-01-18 ENCOUNTER — Other Ambulatory Visit: Payer: Self-pay | Admitting: Family Medicine

## 2017-01-18 DIAGNOSIS — F32A Depression, unspecified: Secondary | ICD-10-CM

## 2017-01-18 DIAGNOSIS — F329 Major depressive disorder, single episode, unspecified: Secondary | ICD-10-CM

## 2017-01-18 MED ORDER — BUPROPION HCL ER (XL) 150 MG PO TB24: 150.0000 mg | ORAL_TABLET | Freq: Every day | ORAL | 0 refills | Status: DC

## 2017-01-19 MED FILL — BUPROPION HCL XL 150 MG TAB: 150 | 30 days supply | Qty: 30 | Fill #0

## 2017-02-09 ENCOUNTER — Other Ambulatory Visit: Payer: Self-pay | Admitting: Family Medicine

## 2017-02-09 DIAGNOSIS — M545 Low back pain, unspecified: Secondary | ICD-10-CM

## 2017-02-09 DIAGNOSIS — G8929 Other chronic pain: Secondary | ICD-10-CM

## 2017-02-09 NOTE — Telephone Encounter (Signed)
CMA call regarding medication refill been sent to pharmacy   Patient verify DOB   Patient was aware and understood

## 2017-02-28 ENCOUNTER — Other Ambulatory Visit: Payer: Self-pay | Admitting: Family Medicine

## 2017-02-28 DIAGNOSIS — F329 Major depressive disorder, single episode, unspecified: Secondary | ICD-10-CM

## 2017-02-28 DIAGNOSIS — F32A Depression, unspecified: Secondary | ICD-10-CM

## 2017-03-01 MED FILL — BUPROPION HCL XL 150 MG TAB: 150 | 30 days supply | Qty: 30 | Fill #0

## 2017-03-02 NOTE — Telephone Encounter (Signed)
CMA call regarding medication refill sent to our pharmacy  & is ready for pick up   Patient did not answer but left a VM stating the reason of the call & to call back

## 2017-04-05 ENCOUNTER — Telehealth: Payer: Self-pay | Admitting: Family Medicine

## 2017-04-05 NOTE — Telephone Encounter (Signed)
Patient called requesting medication refill on buPROPion (WELLBUTRIN XL) 150 MG 24 hr tablet, ibuprofen (ADVIL,MOTRIN) 800 MG tablet, hydrochlorothiazide (HYDRODIURIL) 25 MG tablet,amLODipine (NORVASC) 10 MG tablet, losartan (COZAAR) 100 MG tablet,traZODone (DESYREL) 50 MG tablet.  Pt will not have enough medication to last until appt. Please f/up

## 2017-04-05 NOTE — Telephone Encounter (Signed)
Per PCP's last note, patient was supposed to be seen before any refills would be given. Will forward to PCP for review.

## 2017-04-06 ENCOUNTER — Other Ambulatory Visit: Payer: Self-pay | Admitting: Family Medicine

## 2017-04-06 DIAGNOSIS — M545 Low back pain: Secondary | ICD-10-CM

## 2017-04-06 DIAGNOSIS — F329 Major depressive disorder, single episode, unspecified: Secondary | ICD-10-CM

## 2017-04-06 DIAGNOSIS — E0800 Diabetes mellitus due to underlying condition with hyperosmolarity without nonketotic hyperglycemic-hyperosmolar coma (NKHHC): Secondary | ICD-10-CM

## 2017-04-06 DIAGNOSIS — G4700 Insomnia, unspecified: Secondary | ICD-10-CM

## 2017-04-06 DIAGNOSIS — I10 Essential (primary) hypertension: Secondary | ICD-10-CM

## 2017-04-06 DIAGNOSIS — G8929 Other chronic pain: Secondary | ICD-10-CM

## 2017-04-06 DIAGNOSIS — F32A Depression, unspecified: Secondary | ICD-10-CM

## 2017-04-06 MED ORDER — TRAZODONE HCL 50 MG PO TABS: 25.0000 mg | ORAL_TABLET | Freq: Every evening | ORAL | 0 refills | Status: DC | PRN

## 2017-04-06 MED ORDER — BUPROPION HCL ER (XL) 150 MG PO TB24: 150.0000 mg | ORAL_TABLET | Freq: Every day | ORAL | 0 refills | Status: DC

## 2017-04-06 MED ORDER — IBUPROFEN 800 MG PO TABS: ORAL_TABLET | ORAL | 0 refills | Status: DC

## 2017-04-06 MED ORDER — METFORMIN HCL 1000 MG PO TABS: 1000.0000 mg | ORAL_TABLET | Freq: Two times a day (BID) | ORAL | 0 refills | Status: DC

## 2017-04-06 MED ORDER — AMLODIPINE BESYLATE 10 MG PO TABS: 10.0000 mg | ORAL_TABLET | Freq: Every day | ORAL | 0 refills | Status: DC

## 2017-04-06 MED ORDER — LOSARTAN POTASSIUM 100 MG PO TABS: 100.0000 mg | ORAL_TABLET | Freq: Every day | ORAL | 0 refills | Status: DC

## 2017-04-06 NOTE — Telephone Encounter (Signed)
Encourage patient to keep appointment for follow up. Will refill for 30 day supply only. Last fill without office visit.

## 2017-04-07 NOTE — Telephone Encounter (Signed)
CMA call regarding medication refill is already order   Patient did not answer but left a VM stating the reason of the call & if have any questions just to call back

## 2017-04-10 ENCOUNTER — Other Ambulatory Visit: Payer: Self-pay | Admitting: Family Medicine

## 2017-04-10 DIAGNOSIS — E782 Mixed hyperlipidemia: Secondary | ICD-10-CM

## 2017-04-10 DIAGNOSIS — F32A Depression, unspecified: Secondary | ICD-10-CM

## 2017-04-10 DIAGNOSIS — F329 Major depressive disorder, single episode, unspecified: Secondary | ICD-10-CM

## 2017-04-10 DIAGNOSIS — G4700 Insomnia, unspecified: Secondary | ICD-10-CM

## 2017-04-10 MED FILL — BUPROPION HCL XL 150 MG TAB: 150 | 30 days supply | Qty: 30 | Fill #0

## 2017-04-10 MED FILL — traZODone HCL 50 MG TABS: 50 | 30 days supply | Qty: 15 | Fill #0

## 2017-04-10 MED FILL — TRADJENTA 5 MG TABLET: 5 | 30 days supply | Qty: 30 | Fill #1

## 2017-04-10 MED FILL — ATORVASTATIN 40 MG TABLET: 40 | 30 days supply | Qty: 30 | Fill #0

## 2017-04-24 ENCOUNTER — Ambulatory Visit: Payer: Medicare PPO | Attending: Family Medicine | Admitting: Family Medicine

## 2017-04-24 ENCOUNTER — Encounter: Payer: Self-pay | Admitting: Family Medicine

## 2017-04-24 VITALS — BP 167/95 | HR 81 | Temp 97.8°F | Resp 18 | Ht 67.0 in | Wt 242.0 lb

## 2017-04-24 DIAGNOSIS — Z760 Encounter for issue of repeat prescription: Secondary | ICD-10-CM

## 2017-04-24 DIAGNOSIS — E1165 Type 2 diabetes mellitus with hyperglycemia: Secondary | ICD-10-CM | POA: Diagnosis not present

## 2017-04-24 DIAGNOSIS — E785 Hyperlipidemia, unspecified: Secondary | ICD-10-CM | POA: Diagnosis not present

## 2017-04-24 DIAGNOSIS — Z1231 Encounter for screening mammogram for malignant neoplasm of breast: Secondary | ICD-10-CM | POA: Diagnosis not present

## 2017-04-24 DIAGNOSIS — Z7984 Long term (current) use of oral hypoglycemic drugs: Secondary | ICD-10-CM | POA: Diagnosis not present

## 2017-04-24 DIAGNOSIS — E782 Mixed hyperlipidemia: Secondary | ICD-10-CM | POA: Diagnosis not present

## 2017-04-24 DIAGNOSIS — K5900 Constipation, unspecified: Secondary | ICD-10-CM | POA: Diagnosis not present

## 2017-04-24 DIAGNOSIS — Z79899 Other long term (current) drug therapy: Secondary | ICD-10-CM | POA: Diagnosis not present

## 2017-04-24 DIAGNOSIS — M549 Dorsalgia, unspecified: Secondary | ICD-10-CM

## 2017-04-24 DIAGNOSIS — M545 Low back pain: Secondary | ICD-10-CM | POA: Diagnosis not present

## 2017-04-24 DIAGNOSIS — F329 Major depressive disorder, single episode, unspecified: Secondary | ICD-10-CM | POA: Diagnosis not present

## 2017-04-24 DIAGNOSIS — I10 Essential (primary) hypertension: Secondary | ICD-10-CM | POA: Diagnosis not present

## 2017-04-24 DIAGNOSIS — Z7982 Long term (current) use of aspirin: Secondary | ICD-10-CM | POA: Diagnosis not present

## 2017-04-24 DIAGNOSIS — Z23 Encounter for immunization: Secondary | ICD-10-CM | POA: Diagnosis not present

## 2017-04-24 DIAGNOSIS — Z1239 Encounter for other screening for malignant neoplasm of breast: Secondary | ICD-10-CM

## 2017-04-24 DIAGNOSIS — G4700 Insomnia, unspecified: Secondary | ICD-10-CM | POA: Diagnosis not present

## 2017-04-24 DIAGNOSIS — F32A Depression, unspecified: Secondary | ICD-10-CM

## 2017-04-24 DIAGNOSIS — G8929 Other chronic pain: Secondary | ICD-10-CM

## 2017-04-24 LAB — GLUCOSE, POCT (MANUAL RESULT ENTRY): POC GLUCOSE: 147 mg/dL — AB (ref 70–99)

## 2017-04-24 LAB — POCT GLYCOSYLATED HEMOGLOBIN (HGB A1C): Hemoglobin A1C: 8.2

## 2017-04-24 MED ORDER — METFORMIN HCL 1000 MG PO TABS: 1000.0000 mg | ORAL_TABLET | Freq: Two times a day (BID) | ORAL | 3 refills | Status: DC

## 2017-04-24 MED ORDER — AMLODIPINE BESYLATE 10 MG PO TABS: 10.0000 mg | ORAL_TABLET | Freq: Every day | ORAL | 1 refills | Status: DC

## 2017-04-24 MED ORDER — HYDROCHLOROTHIAZIDE 25 MG PO TABS: 25.0000 mg | ORAL_TABLET | Freq: Every day | ORAL | 1 refills | Status: DC

## 2017-04-24 MED ORDER — POLYETHYLENE GLYCOL 3350 17 G PO PACK: 17.0000 g | PACK | Freq: Every day | ORAL | 2 refills | Status: DC | PRN

## 2017-04-24 MED ORDER — BUPROPION HCL ER (XL) 150 MG PO TB24: 150.0000 mg | ORAL_TABLET | Freq: Every day | ORAL | 1 refills | Status: DC

## 2017-04-24 MED ORDER — ATORVASTATIN CALCIUM 40 MG PO TABS: 40.0000 mg | ORAL_TABLET | Freq: Every day | ORAL | 1 refills | Status: DC

## 2017-04-24 MED ORDER — CARVEDILOL 6.25 MG PO TABS: 6.2500 mg | ORAL_TABLET | Freq: Two times a day (BID) | ORAL | 0 refills | Status: DC

## 2017-04-24 MED ORDER — TRAZODONE HCL 50 MG PO TABS: 50.0000 mg | ORAL_TABLET | Freq: Every evening | ORAL | 0 refills | Status: DC | PRN

## 2017-04-24 MED ORDER — GLIPIZIDE 5 MG PO TABS: 5.0000 mg | ORAL_TABLET | Freq: Every day | ORAL | 2 refills | Status: DC

## 2017-04-24 MED ORDER — LOSARTAN POTASSIUM 100 MG PO TABS: 100.0000 mg | ORAL_TABLET | Freq: Every day | ORAL | 1 refills | Status: DC

## 2017-04-24 MED ORDER — CELECOXIB 200 MG PO CAPS: 200.0000 mg | ORAL_CAPSULE | Freq: Two times a day (BID) | ORAL | 3 refills | Status: DC | PRN

## 2017-04-24 MED ORDER — PNEUMOCOCCAL 13-VAL CONJ VACC IM SUSP
0.5000 mL | Freq: Once | INTRAMUSCULAR | Status: AC
  Administered 2017-04-24: 0.5 mL via INTRAMUSCULAR

## 2017-04-24 MED ORDER — ALBUTEROL SULFATE HFA 108 (90 BASE) MCG/ACT IN AERS: 2.0000 | INHALATION_SPRAY | Freq: Four times a day (QID) | RESPIRATORY_TRACT | 2 refills | Status: DC | PRN

## 2017-04-24 MED ORDER — DICLOFENAC SODIUM 1 % TD GEL: 2.0000 g | Freq: Four times a day (QID) | TRANSDERMAL | 1 refills | Status: DC | PRN

## 2017-04-24 NOTE — Progress Notes (Signed)
Subjective:  Patient ID: [name], female    DOB: 18-Oct-1950  Age: 66 y.o. MRN: [id number]  CC: Medication Refill   HPI [name] presents for follow up. PHM includes HTN, DM, HLD, and depression. HTN: Reports being adherent with taking blood pressure medications daily. Denies any CP, SOB, swelling of the BLE, or dizziness. DM: She reports not taking daily blood glucose.. Reports  She reports nonadherence with taking metformin daily. Chronic lower back pain moderate to severe.  Aggravated by sitting or standing. History of surgical back interventions in the past. Reports taking Tramadol and ibuprofen for pain for relief of symptoms.Depression: Reports improvement of depression and insomnia symptoms on current medication and dose. Denies any SI/HI.  She reports episodes of constipation.  She denies any abdominal pain or blood in stool.   Outpatient Medications Prior to Visit  Medication Sig Dispense Refill  . ACCU-CHEK SOFTCLIX LANCETS lancets Use as instructed 100 each 12  . aspirin EC 81 MG tablet Take 1 tablet (81 mg total) by mouth daily. 30 tablet 5  . Blood Glucose Monitoring Suppl (ACCU-CHEK AVIVA PLUS) w/Device KIT Use as directed 1 kit 0  . docusate sodium (COLACE) 100 MG capsule Take 1 capsule (100 mg total) by mouth 2 (two) times daily. (Patient not taking: Reported on 05/23/2016) 60 capsule 0  . glucose blood (ACCU-CHEK AVIVA PLUS) test strip Use as instructed 100 each 12  . ibuprofen (ADVIL,MOTRIN) 800 MG tablet TAKE 1 TABLET BY MOUTH EVERY 8 HOURS AS NEEDED FOR MODERATE PAIN OR CRAMPING. TAKE WITH FOOD. 30 tablet 0  . oxyCODONE-acetaminophen (PERCOCET) 10-325 MG tablet Take 1 tablet by mouth every 4 (four) hours as needed for pain.     [name] oxyCODONE-acetaminophen (PERCOCET) 10-325 MG tablet Take 1 tablet by mouth every 4 (four) hours as needed for pain. (Patient not taking: Reported on 05/23/2016) 100 tablet 0  . pantoprazole (PROTONIX) 40 MG tablet TAKE 1 TABLET  EVERY DAY 90 tablet 0  . albuterol (PROVENTIL HFA;VENTOLIN HFA) 108 (90 Base) MCG/ACT inhaler Inhale 2 puffs into the lungs every 6 (six) hours as needed for wheezing or shortness of breath. 1 Inhaler 2  . amLODipine (NORVASC) 10 MG tablet Take 1 tablet (10 mg total) by mouth daily. 30 tablet 0  . atorvastatin (LIPITOR) 40 MG tablet TAKE 1 TABLET BY MOUTH DAILY. 90 tablet 0  . buPROPion (WELLBUTRIN XL) 150 MG 24 hr tablet Take 1 tablet (150 mg total) by mouth daily. 30 tablet 0  . buPROPion (WELLBUTRIN XL) 150 MG 24 hr tablet TAKE 1 TABLET BY MOUTH DAILY. 30 tablet 0  . hydrochlorothiazide (HYDRODIURIL) 25 MG tablet Take 2 tablets (50 mg total) by mouth daily. 60 tablet 2  . linagliptin (TRADJENTA) 5 MG TABS tablet Take 1 tablet (5 mg total) by mouth daily. 30 tablet 3  . losartan (COZAAR) 100 MG tablet Take 1 tablet (100 mg total) by mouth daily. 30 tablet 0  . metFORMIN (GLUCOPHAGE) 1000 MG tablet Take 1 tablet (1,000 mg total) by mouth 2 (two) times daily with a meal. 60 tablet 0  . traMADol (ULTRAM) 50 MG tablet Take 1 tablet (50 mg total) by mouth every 8 (eight) hours as needed for moderate pain or severe pain. 30 tablet 0  . traZODone (DESYREL) 50 MG tablet Take 0.5 tablets (25 mg total) by mouth at bedtime as needed for sleep. 30 tablet 0  . traZODone (DESYREL) 50 MG tablet TAKE 1/2 TABLET BY MOUTH AT BEDTIME  AS NEEDED FOR SLEEP 15 tablet 0   No facility-administered medications prior to visit.     ROS Review of Systems  Eyes: Negative.   Respiratory: Negative.   Cardiovascular: Negative.   Gastrointestinal: Positive for constipation.  Musculoskeletal: Positive for back pain (chronic).  Psychiatric/Behavioral:       History of depression.    Objective:  BP (!) 167/95 (BP Location: Left Arm, Patient Position: Sitting, Cuff Size: Normal)   Pulse 81   Temp 97.8 F (36.6 C) (Oral)   Resp 18   Ht '5\' 7"'$  (1.702 m)   Wt 242 lb (109.8 kg)   SpO2 98%   BMI 37.90 kg/m   BP/Weight  04/24/2017 3/55/7322 0/07/5425  Systolic BP 062 376 283  Diastolic BP 95 86 80  Wt. (Lbs) 242 - 234  BMI 37.9 - 36.65    Physical Exam  Constitutional: She appears well-developed and well-nourished.  Eyes: Pupils are equal, round, and reactive to light. Conjunctivae are normal.  Neck: Normal range of motion. No JVD present.  Cardiovascular: Normal rate, regular rhythm, normal heart sounds and intact distal pulses.   Pulmonary/Chest: Effort normal and breath sounds normal.  Abdominal: Soft. Bowel sounds are normal. There is no tenderness.  Musculoskeletal:       Lumbar back: She exhibits pain.  Skin: Skin is warm and dry.  Psychiatric: She has a normal mood and affect.  Nursing note and vitals reviewed.   Depression screen [facility] 2/9 04/24/2017 09/26/2016 05/23/2016  Decreased Interest 2 0 0  Down, Depressed, Hopeless 1 2 0  PHQ - 2 Score 3 2 0  Altered sleeping 3 3 0  Tired, decreased energy 1 2 0  Change in appetite 0 0 0  Feeling bad or failure about yourself  0 0 0  Trouble concentrating 0 0 0  Moving slowly or fidgety/restless 0 0 0  Suicidal thoughts 0 0 0  PHQ-9 Score 7 7 0    GAD 7 : Generalized Anxiety Score 04/24/2017 09/26/2016 05/23/2016 04/28/2016  Nervous, Anxious, on Edge 0 0 0 0  Control/stop worrying 2 0 0 1  Worry too much - different things 3 0 0 2  Trouble relaxing 2 0 0 1  Restless 0 0 0 0  Easily annoyed or irritable 1 2 0 1  Afraid - awful might happen 0 0 0 0  Total GAD 7 Score 8 2 0 5     Assessment & Plan:   1. Uncontrolled type 2 diabetes mellitus with hyperglycemia ([place]) Encouraged to start checking blood sugars and bring glucometer or blood sugar log to next visit. Follow-up with clinical pharmacist in 2 weeks. Follow-up with PCP in 3 months. - HgB A1c - Glucose (CBG) - metFORMIN (GLUCOPHAGE) 1000 MG tablet; Take 1 tablet (1,000 mg total) by mouth 2 (two) times daily with a meal.  Dispense: 180 tablet; Refill: 3 - glipiZIDE (GLUCOTROL) 5 MG  tablet; Take 1 tablet (5 mg total) by mouth daily before breakfast.  Dispense: 30 tablet; Refill: 2  2. Essential hypertension Follow-up with clinical pharmacist in 2 weeks. Follow-up with PCP in 3 months. - amLODipine (NORVASC) 10 MG tablet; Take 1 tablet (10 mg total) by mouth daily.  Dispense: 90 tablet; Refill: 1 - hydrochlorothiazide (HYDRODIURIL) 25 MG tablet; Take 1 tablet (25 mg total) by mouth daily.  Dispense: 90 tablet; Refill: 1 - losartan (COZAAR) 100 MG tablet; Take 1 tablet (100 mg total) by mouth daily.  Dispense: 90 tablet; Refill: 1 -  carvedilol (COREG) 6.25 MG tablet; Take 1 tablet (6.25 mg total) by mouth 2 (two) times daily with a meal.  Dispense: 60 tablet; Refill: 0  3. Chronic back pain greater than 3 months duration  - Ambulatory referral to Pain Clinic - Ambulatory referral to Physical Therapy - DG Lumbar Spine Complete; Future - celecoxib (CELEBREX) 200 MG capsule; Take 1 capsule (200 mg total) by mouth 2 (two) times daily as needed.  Dispense: 60 capsule; Refill: 3  4. Mixed hyperlipidemia  - atorvastatin (LIPITOR) 40 MG tablet; Take 1 tablet (40 mg total) by mouth daily.  Dispense: 90 tablet; Refill: 1  5. Depression, unspecified depression type  - buPROPion (WELLBUTRIN XL) 150 MG 24 hr tablet; Take 1 tablet (150 mg total) by mouth daily.  Dispense: 90 tablet; Refill: 1  6. Constipation, unspecified constipation type  - polyethylene glycol (MIRALAX / GLYCOLAX) packet; Take 17 g by mouth daily as needed for mild constipation or moderate constipation.  Dispense: 30 each; Refill: 2  7. Needs flu shot  - Flu Vaccine QUAD 6+ mos PF IM (Fluarix Quad PF) - pneumococcal 13-valent conjugate vaccine (PREVNAR 13) injection 0.5 mL; Inject 0.5 mLs into the muscle once.  8. Medication refill  - albuterol (PROVENTIL HFA;VENTOLIN HFA) 108 (90 Base) MCG/ACT inhaler; Inhale 2 puffs into the lungs every 6 (six) hours as needed for wheezing or shortness of breath.   Dispense: 1 Inhaler; Refill: 2 - traZODone (DESYREL) 50 MG tablet; Take 1 tablet (50 mg total) by mouth at bedtime as needed for sleep.  Dispense: 60 tablet; Refill: 0  9. Screening for breast cancer  - MM SCREENING BREAST TOMO BILATERAL; Future   Meds ordered this encounter  Medications  . diclofenac sodium (VOLTAREN) 1 % GEL    Sig: Apply 2 g topically 4 (four) times daily as needed.    Dispense:  1 Tube    Refill:  1    Order Specific Question:   Supervising Provider    Answer:   Tresa Garter [id number]  . buPROPion (WELLBUTRIN XL) 150 MG 24 hr tablet    Sig: Take 1 tablet (150 mg total) by mouth daily.    Dispense:  90 tablet    Refill:  1    Order Specific Question:   Supervising Provider    Answer:   Tresa Garter [id number]  . albuterol (PROVENTIL HFA;VENTOLIN HFA) 108 (90 Base) MCG/ACT inhaler    Sig: Inhale 2 puffs into the lungs every 6 (six) hours as needed for wheezing or shortness of breath.    Dispense:  1 Inhaler    Refill:  2    Order Specific Question:   Supervising Provider    Answer:   Tresa Garter [id number]  . traZODone (DESYREL) 50 MG tablet    Sig: Take 1 tablet (50 mg total) by mouth at bedtime as needed for sleep.    Dispense:  60 tablet    Refill:  0    Order Specific Question:   Supervising Provider    Answer:   Tresa Garter [id number]  . polyethylene glycol (MIRALAX / GLYCOLAX) packet    Sig: Take 17 g by mouth daily as needed for mild constipation or moderate constipation.    Dispense:  30 each    Refill:  2    Order Specific Question:   Supervising Provider    Answer:   Tresa Garter [id number]  . atorvastatin (LIPITOR) 40 MG tablet    Sig: Take  1 tablet (40 mg total) by mouth daily.    Dispense:  90 tablet    Refill:  1    Order Specific Question:   Supervising Provider    Answer:   Tresa Garter [id number]  . metFORMIN (GLUCOPHAGE) 1000 MG tablet    Sig: Take 1 tablet (1,000 mg total) by mouth 2  (two) times daily with a meal.    Dispense:  180 tablet    Refill:  3    Order Specific Question:   Supervising Provider    Answer:   Tresa Garter [id number]  . amLODipine (NORVASC) 10 MG tablet    Sig: Take 1 tablet (10 mg total) by mouth daily.    Dispense:  90 tablet    Refill:  1    Order Specific Question:   Supervising Provider    Answer:   Tresa Garter [id number]  . hydrochlorothiazide (HYDRODIURIL) 25 MG tablet    Sig: Take 1 tablet (25 mg total) by mouth daily.    Dispense:  90 tablet    Refill:  1    Order Specific Question:   Supervising Provider    Answer:   Tresa Garter [id number]  . losartan (COZAAR) 100 MG tablet    Sig: Take 1 tablet (100 mg total) by mouth daily.    Dispense:  90 tablet    Refill:  1    Order Specific Question:   Supervising Provider    Answer:   Tresa Garter [id number]  . carvedilol (COREG) 6.25 MG tablet    Sig: Take 1 tablet (6.25 mg total) by mouth 2 (two) times daily with a meal.    Dispense:  60 tablet    Refill:  0    Order Specific Question:   Supervising Provider    Answer:   Tresa Garter [id number]  . glipiZIDE (GLUCOTROL) 5 MG tablet    Sig: Take 1 tablet (5 mg total) by mouth daily before breakfast.    Dispense:  30 tablet    Refill:  2    Order Specific Question:   Supervising Provider    Answer:   Tresa Garter [id number]  . celecoxib (CELEBREX) 200 MG capsule    Sig: Take 1 capsule (200 mg total) by mouth 2 (two) times daily as needed.    Dispense:  60 capsule    Refill:  3    Order Specific Question:   Supervising Provider    Answer:   Tresa Garter [id number]  . pneumococcal 13-valent conjugate vaccine (PREVNAR 13) injection 0.5 mL    Follow-up: Return in about 2 weeks (around 10/10/2016) for BP & HTN with clinical pharmacist.     Alfonse Spruce FNP

## 2017-04-24 NOTE — Progress Notes (Signed)
Patient has not taking her metformin   Patient complains  back pain everyday & both ankles are bothering her

## 2017-04-24 NOTE — Patient Instructions (Addendum)
Bring BP and blood sugar readings to next office visit.  Type 2 Diabetes Mellitus, Self Care, Adult When you have type 2 diabetes (type 2 diabetes mellitus), you must keep your blood sugar (glucose) under control. You can do this with:  Nutrition.  Exercise.  Lifestyle changes.  Medicines or insulin, if needed.  Support from your doctors and others.  How do I manage my blood sugar?  Check your blood sugar level every day, as often as told.  Call your doctor if your blood sugar is above your goal numbers for 2 tests in a row.  Have your A1c (hemoglobin A1c) level checked at least two times a year. Have it checked more often if your doctor tells you to. Your doctor will set treatment goals for you. Generally, you should have these blood sugar levels:  Before meals (preprandial): 80-130 mg/dL (4.4-7.2 mmol/L).  After meals (postprandial): lower than 180 mg/dL (10 mmol/L).  A1c level: less than 7%.  What do I need to know about high blood sugar? High blood sugar is called hyperglycemia. Know the signs of high blood sugar. Signs may include:  Feeling: ? Thirsty. ? Hungry. ? Very tired.  Needing to pee (urinate) more than usual.  Blurry vision.  What do I need to know about low blood sugar? Low blood sugar is called hypoglycemia. This is when blood sugar is at or below 70 mg/dL (3.9 mmol/L). Symptoms may include:  Feeling: ? Hungry. ? Worried or nervous (anxious). ? Sweaty and clammy. ? Confused. ? Dizzy. ? Sleepy. ? Sick to your stomach (nauseous).  Having: ? A fast heartbeat (palpitations). ? A headache. ? A change in your vision. ? Jerky movements that you cannot control (seizure). ? Nightmares. ? Tingling or no feeling (numbness) around the mouth, lips, or tongue.  Having trouble with: ? Talking. ? Paying attention (concentrating). ? Moving (coordination). ? Sleeping.  Shaking.  Passing out (fainting).  Getting upset easily  (irritability).  Treating low blood sugar  To treat low blood sugar, eat or drink something sugary right away. If you can think clearly and swallow safely, follow the 15:15 rule:  Take 15 grams of a fast-acting carb (carbohydrate). Some fast-acting carbs are: ? 1 tube of glucose gel. ? 3 sugar tablets (glucose pills). ? 6-8 pieces of hard candy. ? 4 oz (120 mL) of fruit juice. ? 4 oz (120 mL) regular (not diet) soda.  Check your blood sugar 15 minutes after you take the carb.  If your blood sugar is still at or below 70 mg/dL (3.9 mmol/L), take 15 grams of a carb again.  If your blood sugar does not go above 70 mg/dL (3.9 mmol/L) after 3 tries, get help right away.  After your blood sugar goes back to normal, eat a meal or a snack within 1 hour.  Treating very low blood sugar If your blood sugar is at or below 54 mg/dL (3 mmol/L), you have very low blood sugar (severe hypoglycemia). This is an emergency. Do not wait to see if the symptoms will go away. Get medical help right away. Call your local emergency services (911 in the [place] Do not drive yourself to the hospital. If you have very low blood sugar and you cannot eat or drink, you may need a glucagon shot (injection). A family member or friend should learn how to check your blood sugar and how to give you a glucagon shot. Ask your doctor if you need to have a glucagon shot kit  at home. What else is important to manage my diabetes? Medicine Follow these instructions about insulin and diabetes medicines:  Take them as told by your doctor.  Adjust them as told by your doctor.  Do not run out of them.  Having diabetes can raise your risk for other long-term conditions. These include heart or kidney disease. Your doctor may prescribe medicines to help prevent problems from diabetes. Food   Make healthy food choices. These include: ? Chicken, fish, egg whites, and beans. ? Oats, whole wheat, bulgur, brown rice, quinoa, and  millet. ? Fresh fruits and vegetables. ? Low-fat dairy products. ? Nuts, avocado, olive oil, and canola oil.  Make a food plan with a specialist (dietitian).  Follow instructions from your doctor about what you cannot eat or drink.  Drink enough fluid to keep your pee (urine) clear or pale yellow.  Eat healthy snacks between healthy meals.  Keep track of carbs that you eat. Read food labels. Learn food serving sizes.  Follow your sick day plan when you cannot eat or drink normally. Make this plan with your doctor so it is ready to use. Activity  Exercise at least 3 times a week.  Do not go more than 2 days without exercising.  Talk with your doctor before you start a new exercise. Your doctor may need to adjust your insulin, medicines, or food. Lifestyle   Do not use any tobacco products. These include cigarettes, chewing tobacco, and e-cigarettes.If you need help quitting, ask your doctor.  Ask your doctor how much alcohol is safe for you.  Learn to deal with stress. If you need help with this, ask your doctor. Body care  Stay up to date with your shots (immunizations).  Have your eyes and feet checked by a doctor as often as told.  Check your skin and feet every day. Check for cuts, bruises, redness, blisters, or sores.  Brush your teeth and gums two times a day.  Floss at least one time a day.  Go to the dentist least one time every 6 months.  Stay at a healthy weight. General instructions   Take over-the-counter and prescription medicines only as told by your doctor.  Share your diabetes care plan with: ? Your work or school. ? People you live with.  Check your pee (urine) for ketones: ? When you are sick. ? As told by your doctor.  Carry a card or wear jewelry that says that you have diabetes.  Ask your doctor: ? Do I need to meet with a diabetes educator? ? Where can I find a support group for people with diabetes?  Keep all follow-up visits as  told by your doctor. This is important. Where to find more information: To learn more about diabetes, visit:  American Diabetes Association: www.diabetes.org  American Association of Diabetes Educators: www.diabeteseducator.org/patient-resources  This information is not intended to replace advice given to you by your health care provider. Make sure you discuss any questions you have with your health care provider. Document Released: 10/05/2015 Document Revised: 11/19/2015 Document Reviewed: 07/17/2015 Elsevier Interactive Patient Education  [employer].

## 2017-04-25 ENCOUNTER — Telehealth: Payer: Self-pay | Admitting: Family Medicine

## 2017-04-25 NOTE — Telephone Encounter (Signed)
Patient called bone density test the same day as mammogram

## 2017-04-28 ENCOUNTER — Other Ambulatory Visit: Payer: Self-pay | Admitting: Family Medicine

## 2017-04-28 MED FILL — glipiZIDE 5 MG TABS: 5 | 30 days supply | Qty: 30 | Fill #0

## 2017-04-28 MED FILL — VENTOLIN HFA 90 MCG INHALER: 108 (90 BAS | 25 days supply | Qty: 18 | Fill #0

## 2017-04-28 MED FILL — POLYETHYLENE GLYCOL 3350 PO: 30 days supply | Qty: 510 | Fill #0

## 2017-04-28 MED FILL — HYDROCHLOROTHIAZIDE 25 MG T: 25 | 90 days supply | Qty: 90 | Fill #0

## 2017-04-28 MED FILL — CELECOXIB 200 MG CAPSULE: 200 | 30 days supply | Qty: 60 | Fill #0

## 2017-04-28 MED FILL — VOLTAREN 1% GEL: 1 | 12 days supply | Qty: 100 | Fill #0

## 2017-04-28 MED FILL — CARVEDILOL 6.25 MG TABLET: 6.25 | 30 days supply | Qty: 60 | Fill #0

## 2017-05-04 ENCOUNTER — Other Ambulatory Visit: Payer: Self-pay | Admitting: Family Medicine

## 2017-05-04 DIAGNOSIS — E2839 Other primary ovarian failure: Secondary | ICD-10-CM

## 2017-05-04 NOTE — Telephone Encounter (Signed)
Placed order for bone density same day as MM.

## 2017-05-09 ENCOUNTER — Encounter: Payer: Self-pay | Admitting: Pharmacist

## 2017-05-09 ENCOUNTER — Ambulatory Visit: Payer: Medicare PPO | Attending: Family Medicine | Admitting: Pharmacist

## 2017-05-09 VITALS — BP 121/74 | HR 74 | Wt 248.2 lb

## 2017-05-09 DIAGNOSIS — Z79899 Other long term (current) drug therapy: Secondary | ICD-10-CM | POA: Diagnosis not present

## 2017-05-09 DIAGNOSIS — E1165 Type 2 diabetes mellitus with hyperglycemia: Secondary | ICD-10-CM | POA: Diagnosis not present

## 2017-05-09 DIAGNOSIS — I10 Essential (primary) hypertension: Secondary | ICD-10-CM | POA: Diagnosis not present

## 2017-05-09 DIAGNOSIS — Z7984 Long term (current) use of oral hypoglycemic drugs: Secondary | ICD-10-CM | POA: Diagnosis not present

## 2017-05-09 LAB — GLUCOSE, POCT (MANUAL RESULT ENTRY): POC GLUCOSE: 212 mg/dL — AB (ref 70–99)

## 2017-05-09 NOTE — Patient Instructions (Addendum)
Thanks for coming to see me!  Your blood pressure is great!!!  Check some blood sugars about 2 hours after you eat. Our goal for those blood sugars is to be less than 180.   The goal for the blood sugars first thing in the morning before you eat are less than 130  Come back and see me in 1 month for blood sugar review

## 2017-05-09 NOTE — Progress Notes (Signed)
    S:     Chief Complaint  Patient presents with  . Medication Management    Patient arrives in good spirits.  Presents for diabetes evaluation, education, and management at the request of Dr. Doreene Burke. Patient was referred on 04/24/17.  Patient was last seen by Primary Care Provider on 04/24/17.   Patient reports adherence with medications.  Current diabetes medications include: metformin 1000 mg BID, glipizide 5 mg daily.  Current hypertension medications include: amlodipine 10 mg daily, carvedilol 6.25 mg BID, hydrochlorothiazide 25 mg daily, losartan 100 mg daily  Patient denies hypoglycemic events.  Patient reported dietary habits: trying to eat better but today she went to a friend's house and ate a sandwich, chips, and 6 oatmeal cookies for lunch.   O:  Physical Exam   ROS   Lab Results  Component Value Date   HGBA1C 8.2 04/24/2017   Vitals:   05/09/17 1538  BP: 121/74  Pulse: 74    Home fasting CBG: 120s-130s  2 hour post-prandial/random CBG: doesn't check.  POCT glucose = 212 (post-prandial)  A/P: Diabetes longstanding currently uncontrolled based on A1c of 8.2. Patient denies hypoglycemic events and is able to verbalize appropriate hypoglycemia management plan. Patient reports adherence with medication. Control is suboptimal due to dietary indiscretion and sedentary lifestyle.  Not enough readings from home to make any changes. POCT in office was elevated at 212 but this is expected from what she ate for lunch. Continue current medications as prescribed and instructed patient to obtain more home CBGs, including post-prandial readings. Next A1C anticipated February 2019.    Hypertension longstanding currently controlled.  Patient reports adherence with medication. Congratulated patient on blood pressure control and encouraged her to continue to take her medications as prescribed.  Written patient instructions provided.  Total time in face to face counseling 15  minutes.   Follow up in Pharmacist Clinic Visit in 1 month.

## 2017-05-24 ENCOUNTER — Ambulatory Visit: Payer: Medicare PPO | Attending: Internal Medicine | Admitting: Physical Therapy

## 2017-05-24 ENCOUNTER — Encounter: Payer: Self-pay | Admitting: Physical Therapy

## 2017-05-24 ENCOUNTER — Ambulatory Visit
Admission: RE | Admit: 2017-05-24 | Discharge: 2017-05-24 | Disposition: A | Discharge status: Home / Self Care | Payer: Medicare PPO | Source: Ambulatory Visit | Attending: Family Medicine | Admitting: Family Medicine

## 2017-05-24 DIAGNOSIS — R262 Difficulty in walking, not elsewhere classified: Secondary | ICD-10-CM | POA: Insufficient documentation

## 2017-05-24 DIAGNOSIS — Z1239 Encounter for other screening for malignant neoplasm of breast: Secondary | ICD-10-CM

## 2017-05-24 DIAGNOSIS — M545 Low back pain: Secondary | ICD-10-CM | POA: Insufficient documentation

## 2017-05-24 DIAGNOSIS — G8929 Other chronic pain: Secondary | ICD-10-CM | POA: Diagnosis present

## 2017-05-24 NOTE — Therapy (Signed)
[facility] [facility] [address] [place], [place], [address] Phone: [phone]   Fax:  [phone]  Physical Therapy Evaluation  Patient Details  Name: AYDEN APODACA MRN: [id number] Date of Birth: 1951/02/22 Referring Provider: Fredia Beets, FNP   Encounter Date: 05/24/2017  PT End of Session - 05/24/17 1252    Visit Number  1    Date for PT Re-Evaluation  07/24/17    PT Start Time  1230    PT Stop Time  1330    PT Time Calculation (min)  60 min    Activity Tolerance  Patient limited by pain    Behavior During Therapy  [facility] for tasks assessed/performed       Past Medical History:  Diagnosis Date  . Abscess in epidural space of L2-L5 lumbar spine 03/2006  . Anemia, iron deficiency   . Arthritis   . CAD (coronary artery disease), native coronary artery 03/30/2015  . Cataract   . Colon polyp    a. Multiple colonic polyps status post colonoscopy in October 2007, consistent with tubular adenoma, tubulovillous adenoma with no high-grade dysplasia or malignancy identified.   . Constipation   . Coronary artery disease    a. s/p CABG 06/2010: S-D2; S-PDA (at time of MV surgery).  . Depression   . Diabetes mellitus    type II  . GERD (gastroesophageal reflux disease)   . Hx of transient ischemic attack (TIA)    a. See stroke section.  . Hyperlipidemia   . Hypertension   . Hypoxia    a. Has history of acute hypoxic respiratory failure in the setting of bronchitis/PNA or prior admissions.  [name] MRSA infection    a. History of recurrent skin infection and soft tissue abscesses, with MRSA positive in the past.  . 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 mitral valve - s/p resection and MV repair 06/2010 Dr. [name].  . 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 Sleep apnea   . Stenosis of middle cerebral artery    a. Distal R MCA.  . Stroke [facility])    a. 11/2009: mitral mass diagnosed at this time, also has distal R  MCA stenosis, tx with coumadin. b. Readmitted 05/2010 with TIA symptoms - had not been taking Coumadin. s/p MV surgery 06/2010. Coumadin stopped 2013 after review of chart by Dr. [name] since mass was removed (stroke felt possibly related to this).     Past Surgical History:  Procedure Laterality Date  . CARDIAC CATHETERIZATION  04/03/2015   Procedure: Left Heart Cath and Cors/Grafts Angiography;  Surgeon: Peter M Martinique, MD;  Location: [facility];  Service: Cardiovascular;;  . CORONARY ARTERY BYPASS GRAFT  1/12  . history of ankle fractures requiring surgery    . LUMBAR LAMINECTOMY/DECOMPRESSION MICRODISCECTOMY Left 04/15/2015   Procedure: LUMBAR LAMINECTOMY/DECOMPRESSION MICRODISCECTOMY 1 LEVEL;  Surgeon: Newman Pies, MD;  Location: [facility];  Service: Neurosurgery;  Laterality: Left;  Left L23 microdiskectomy  . MV repair and resection of mass  1/12  . TONSILLECTOMY  1971  . TOTAL ABDOMINAL HYSTERECTOMY  1978  . VESICOVAGINAL FISTULA CLOSURE W/ TAH      There were no vitals filed for this visit.   Subjective Assessment - 05/24/17 1229    Subjective  Patient reports that she has "excruciating pain" daily.  She underwent a laminectomy and discectomy in 2016.  She reports that she has not had any type of therapy.  Limitations  Lifting;Standing;House hold activities    How long can you stand comfortably?  2 minutes    How long can you walk comfortably?  200 feet    Patient Stated Goals  have less pain    Currently in Pain?  Yes    Pain Score  5     Pain Location  Back    Pain Orientation  Lower    Pain Descriptors / Indicators  Aching;Cramping;Spasm    Pain Type  Chronic pain    Pain Radiating Towards  into buttocks    Pain Onset  More than a month ago    Pain Frequency  Constant    Aggravating Factors   pain up to 9-10/10, just get up and walk, trying to do ADL's    Pain Relieving Factors  heat helps some, some pain medications, rest, at best pain is a 4/10     Effect of Pain on Daily Activities  limits everything          [facility] PT Assessment - 05/24/17 0001      Assessment   Medical Diagnosis  low back pain    Referring Provider  Fredia Beets, FNP    Onset Date/Surgical Date  03/24/17    Prior Therapy  none      Precautions   Precautions  None      Balance Screen   Has the patient fallen in the past 6 months  No    Has the patient had a decrease in activity level because of a fear of falling?   No    Is the patient reluctant to leave their home because of a fear of falling?   No      Home Environment   Additional Comments  reports that husband does most of the housework, reports that she rides an Web designer to shop      Prior Function   Level of Independence  Independent with household mobility with device    Vocation  Retired    Leisure  no exercise      Posture/Postural Control   Posture Comments  fwd head, rounded shoulders      ROM / Strength   AROM / PROM / Strength  AROM;Strength      AROM   Overall AROM Comments  Lumbar ROM extension decreased 100%, other motions decreased 75% with increased pain for all motions      Strength   Overall Strength Comments  4-/5 for the LE's with back pain      Flexibility   Soft Tissue Assessment /Muscle Length  -- tight piriformis      Palpation   Palpation comment  she is tight with some tenderness in the lumbar paraspinals      Transfers   Comments  difficulty getting up from sitting has to use hands on thighs to help stand      Ambulation/Gait   Gait Comments  waddles, toes out, weakness of the hips             Objective measurements completed on examination: See above findings.      [facility] Adult PT Treatment/Exercise - 05/24/17 0001      Modalities   Modalities  Electrical Stimulation;Moist Heat      Moist Heat Therapy   Number Minutes Moist Heat  15 Minutes    Moist Heat Location  Lumbar Spine      Electrical Stimulation   Electrical Stimulation  Location  lumbar spine  Electrical Stimulation Action  IFC    Electrical Stimulation Parameters  supine    Electrical Stimulation Goals  Pain             PT Education - 05/24/17 1252    Education provided  Yes    Education Details  Wms flexion    Person(s) Educated  Patient    Methods  Explanation;Demonstration;Handout    Comprehension  Verbalized understanding       PT Short Term Goals - 05/24/17 1302      PT SHORT TERM GOAL #1   Title  independent with initial HEP    Time  2    Period  Weeks    Status  New        PT Long Term Goals - 05/24/17 1302      PT LONG TERM GOAL #1   Title  decrease pain 25%    Time  8    Period  Weeks    Status  New      PT LONG TERM GOAL #2   Title  increase lumbar ROM 25%    Time  8    Period  Weeks    Status  New      PT LONG TERM GOAL #3   Title  understand proper posture and body mechanics    Time  8    Period  Weeks    Status  New      PT LONG TERM GOAL #4   Title  tolerate walking 400 feet without rest    Time  8    Period  Weeks    Status  New             Plan - 05/24/17 1253    Clinical Impression Statement  Patient reports tha tshe has had long standing low back pain, had a laminectomy and discectomy in 2016, reports she has never had PT, reports that the surgery helped some.  She uses an electric cart to shop, uses a stool to sit when she has to cook.  Lumbar ROM is decreased 100% for extension and 75% for all other motions.    Clinical Presentation  Stable    Clinical Decision Making  Low    Rehab Potential  Fair    PT Frequency  2x / week    PT Duration  8 weeks    PT Treatment/Interventions  ADLs/Self Care Home Management;Cryotherapy;Electrical Stimulation;Moist Heat;Traction;Neuromuscular re-education;Balance training;Therapeutic exercise;Therapeutic activities;Functional mobility training;Patient/family education;Manual techniques    PT Next Visit Plan  slowly add activities with easy  stabilization    Consulted and Agree with Plan of Care  Patient       Patient will benefit from skilled therapeutic intervention in order to improve the following deficits and impairments:  Abnormal gait, Decreased range of motion, Difficulty walking, Increased muscle spasms, Pain, Decreased balance, Impaired flexibility, Improper body mechanics, Postural dysfunction, Decreased strength, Decreased mobility  Visit Diagnosis: Chronic bilateral low back pain without sciatica - Plan: PT plan of care cert/re-cert  Difficulty in walking, not elsewhere classified - Plan: PT plan of care cert/re-cert  G-Codes - 78/29/56 1316    Functional Assessment Tool Used (Outpatient Only)  foto 67% limitation    Functional Limitation  Self care    Self Care Current Status (O1308)  At least 60 percent but less than 80 percent impaired, limited or restricted    Self Care Goal Status (M5784)  At least 40 percent but less than 60 percent impaired,  limited or restricted        Problem List Patient Active Problem List   Diagnosis Date Noted  . Lumbar herniated disc 04/15/2015  . Abnormal nuclear stress test 04/03/2015  . 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 CAD (coronary artery disease), native coronary artery 03/30/2015  . COPD (chronic obstructive pulmonary disease) ([place]) 12/24/2013  . Acute respiratory failure with hypoxia ([place]) 02/16/2013  . S/P mitral valve repair 11/16/2010  . Dyslipidemia 06/29/2010  . Diabetes ([place]) 09/29/2006  . ANEMIA-IRON DEFICIENCY 09/29/2006  . Essential hypertension 09/29/2006    Sumner Boast., PT 05/24/2017, 1:18 PM  [address] [place], [place], [address] Phone: [phone]   Fax:  [phone]  Name: ALEXIANNA NACHREINER MRN: [id number] Date of Birth: [birth year]

## 2017-05-26 ENCOUNTER — Other Ambulatory Visit: Payer: Self-pay | Admitting: Family Medicine

## 2017-05-26 DIAGNOSIS — R928 Other abnormal and inconclusive findings on diagnostic imaging of breast: Secondary | ICD-10-CM

## 2017-06-01 ENCOUNTER — Ambulatory Visit
Admission: RE | Admit: 2017-06-01 | Discharge: 2017-06-01 | Disposition: A | Discharge status: Home / Self Care | Payer: Medicare PPO | Source: Ambulatory Visit | Attending: Family Medicine | Admitting: Family Medicine

## 2017-06-01 ENCOUNTER — Other Ambulatory Visit: Payer: Self-pay | Admitting: Family Medicine

## 2017-06-01 DIAGNOSIS — R928 Other abnormal and inconclusive findings on diagnostic imaging of breast: Secondary | ICD-10-CM

## 2017-06-01 DIAGNOSIS — R921 Mammographic calcification found on diagnostic imaging of breast: Secondary | ICD-10-CM

## 2017-06-01 DIAGNOSIS — N6310 Unspecified lump in the right breast, unspecified quadrant: Secondary | ICD-10-CM

## 2017-06-08 ENCOUNTER — Ambulatory Visit: Payer: Medicare PPO | Admitting: Pharmacist

## 2017-06-12 ENCOUNTER — Ambulatory Visit
Admission: RE | Admit: 2017-06-12 | Discharge: 2017-06-12 | Disposition: A | Discharge status: Home / Self Care | Payer: Medicare PPO | Source: Ambulatory Visit | Attending: Family Medicine | Admitting: Family Medicine

## 2017-06-12 DIAGNOSIS — C50919 Malignant neoplasm of unspecified site of unspecified female breast: Secondary | ICD-10-CM

## 2017-06-12 DIAGNOSIS — R921 Mammographic calcification found on diagnostic imaging of breast: Secondary | ICD-10-CM

## 2017-06-12 DIAGNOSIS — N6310 Unspecified lump in the right breast, unspecified quadrant: Secondary | ICD-10-CM

## 2017-06-12 DIAGNOSIS — R928 Other abnormal and inconclusive findings on diagnostic imaging of breast: Secondary | ICD-10-CM

## 2017-06-12 HISTORY — DX: Malignant neoplasm of unspecified site of unspecified female breast: C50.919

## 2017-06-15 ENCOUNTER — Telehealth: Payer: Self-pay

## 2017-06-15 ENCOUNTER — Telehealth: Payer: Self-pay | Admitting: *Deleted

## 2017-06-15 NOTE — Telephone Encounter (Signed)
   [facility] Pre-operative Risk Assessment    Request for surgical clearance:  1. What type of surgery is being performed? Right breast lumpectomy (seed localization) and right axillary sentinel lymph node biopsy   2. When is this surgery scheduled? Not scheduled--will require written cardiac clearance in order to get a surgery date   3. Are there any medications that need to be held prior to surgery and how long?none noted   4. Practice name and name of physician performing surgery? [facility]--Dr. Lucia Gaskins   5. What is your office phone and fax number?        Phone [phone]       [phone]--fax clearance note to the attention of April Staton, CMA  6. Anesthesia type (None, local, MAC, general) ? general   Please call to advise if this patient will require an office visit or further medical work up before clearance can be given.  Please call [phone] (main number) and leave a message with the triage nurse. _________________________________________________________________   (provider comments below)

## 2017-06-15 NOTE — Telephone Encounter (Signed)
Spoke to [name] CMA with Dr.Newman's office.Advised patient has appointment with Cecilie Kicks NP 06/29/17 for surgical clearance.

## 2017-06-15 NOTE — Telephone Encounter (Signed)
   [facility] Pre-operative Risk Assessment    Request for surgical clearance:  1. What type of surgery is being performed? Right breast lumpectomy and right axillary sentinel lymph node biopsy   2. When is this surgery scheduled? TBD  3. Are there any medications that need to be held prior to surgery and how long? None listed    4. Practice name and name of physician performing surgery?  1. [facility] 2. Dr. Lucia Gaskins   5. What is your office phone and fax number?   Phone: [phone]  Fax: [phone] ATTN April Staton, [place]   6. Anesthesia type (None, local, MAC, general) ? General    _________________________________________________________________   (provider comments below)

## 2017-06-15 NOTE — Telephone Encounter (Signed)
   Primary Cardiologist:Dr Turner  Chart reviewed as part of pre-operative protocol coverage. Because of [name]'s past medical history and time since last visit, he/she will require a follow-up visit in order to better assess preoperative cardiovascular risk.  Pre-op covering staff: - Please schedule appointment and call patient to inform them. - Please contact requesting surgeon's office via preferred method (i.e, phone, fax) to inform them of need for appointment prior to surgery.  [name], PA-C  06/15/2017, 3:27 PM

## 2017-06-15 NOTE — Telephone Encounter (Signed)
Spoke to patient appointment scheduled with Cecilie Kicks NP 06/29/17 at 11:30 am.

## 2017-06-21 ENCOUNTER — Telehealth: Payer: Self-pay | Admitting: Hematology and Oncology

## 2017-06-21 NOTE — Telephone Encounter (Signed)
Left message on voice mail for patient regarding appointment D/T/Loc/Phone#

## 2017-06-22 ENCOUNTER — Encounter: Payer: Self-pay | Admitting: Radiation Oncology

## 2017-06-22 NOTE — Progress Notes (Signed)
Location of Breast Cancer: Right Breast Upper outer Quadrant  Histology per Pathology Report: Diagnosis 06/12/17: 1. Breast, right, needle core biopsy, 10:00 o'clock - INVASIVE DUCTAL CARCINOMA- SEE COMMENT 2. Breast, right, needle core biopsy, anterior UOQ - FIBROADENOMATOID NODULE WITH CALCIFICATIONS- NO CARCINOMA IDENTIFIED 3. Breast, right, needle core biopsy, posterior UOQ- FIBROCYSTIC CHANGES AND FIBROADENOMATOID NODULE WITH CALCIFICATIONS- NO CARCINOMA IDENTIFIED  Receptor Status: ER(95%+), PR (30%+), Her2-neu (neg ratio=1.36), Ki-(15%)  Did patient present with symptoms (if so, please note symptoms) or was this found on screening mammography?:  Routine mammogram  Past/Anticipated interventions by surgeon, if any: Alphonsa Overall, MD not scheduled as yet,needs cardiac clearance first seeing tomorrow  Past/Anticipated interventions by medical oncology, if any: Chemotherapy : Dr. Lucien Mons appt 06/26/17: follow up after surgery  Lymphedema issues, if any: no  Pain issues, if any: low back pain   SAFETY ISSUES: yes walks with cane at times,  Prior radiation? NO  Pacemaker/ICD?  NO  Possible current pregnancy? NO  Is the patient on methotrexate? No  Current Complaints / other details: Married, 3 children,  Depression, DM II, HxTIA,HTN,Hypoxia, MRSA+,sleep apnea,CAD,.Papillary fibroelastoma of heart, ,MV repair 06/2010,stenosis of middle cerebral artery,stroke, Cardiac cath 04/03/15,Coronary artery bypass graft 06/2010,vesicogaginal fistula closure with TAH 1978   Mother colon cancer, Father prostate cancer, ,Sister Gi bleed,cirrhosis liver  Deceased,Brother stroke,   Allergies:NKA BP (!) 174/72   Pulse 85   Temp 98.7 F (37.1 C) (Oral)   Resp 20   Ht '5\' 7"'$  (1.702 m)   Wt 242 lb 9.6 oz (110 kg)   SpO2 99%   BMI 38.00 kg/m   Wt Readings from Last 3 Encounters:  06/29/17 242 lb 9.6 oz (110 kg)  06/26/17 244 lb 14.4 oz (111.1 kg)  05/09/17 248 lb 3.2 oz (112.6 kg)       Rebecca Eaton, RN 06/22/2017,10:49 AM

## 2017-06-26 ENCOUNTER — Ambulatory Visit (HOSPITAL_BASED_OUTPATIENT_CLINIC_OR_DEPARTMENT_OTHER): Payer: Medicare PPO | Admitting: Hematology and Oncology

## 2017-06-26 ENCOUNTER — Encounter: Payer: Self-pay | Admitting: *Deleted

## 2017-06-26 ENCOUNTER — Encounter: Payer: Self-pay | Admitting: Radiation Oncology

## 2017-06-26 DIAGNOSIS — Z170 Estrogen receptor positive status [ER+]: Secondary | ICD-10-CM | POA: Diagnosis not present

## 2017-06-26 DIAGNOSIS — C50411 Malignant neoplasm of upper-outer quadrant of right female breast: Secondary | ICD-10-CM | POA: Diagnosis not present

## 2017-06-26 DIAGNOSIS — I10 Essential (primary) hypertension: Secondary | ICD-10-CM | POA: Diagnosis not present

## 2017-06-26 DIAGNOSIS — E119 Type 2 diabetes mellitus without complications: Secondary | ICD-10-CM | POA: Diagnosis not present

## 2017-06-26 MED FILL — LOSARTAN POTASSIUM 100 MG T: 100 | 90 days supply | Qty: 90 | Fill #0

## 2017-06-26 MED FILL — AMLODIPINE BESYLATE 10 MG T: 10 | 90 days supply | Qty: 90 | Fill #0

## 2017-06-26 MED FILL — BUPROPION HCL XL 150 MG TAB: 150 | 90 days supply | Qty: 90 | Fill #0

## 2017-06-26 NOTE — Progress Notes (Signed)
[facility] CONSULT NOTE  Patient Care Team: Alfonse Spruce, FNP as PCP - General (Family Medicine)  CHIEF COMPLAINTS/PURPOSE OF CONSULTATION:  Newly diagnosed breast cancer  HISTORY OF PRESENTING ILLNESS:  [name] 66 y.o. female is here because of recent diagnosis of right breast cancer.  Patient had a routine screening mammogram that detected abnormality in the right breast.  This was further evaluated by ultrasound.  She is a 1.7 cm lesion in the upper outer quadrant of the right breast.  This was biopsy proven to be invasive ductal carcinoma grade 2 that was ER PR positive HER-2 negative.  She was seen by surgery and she is here today to discuss adjuvant treatment plans.  I reviewed her records extensively and collaborated the history with the patient.  SUMMARY OF ONCOLOGIC HISTORY:   Malignant neoplasm of upper-outer quadrant of right breast in female, estrogen receptor positive ([place])   06/12/2017 Initial Diagnosis    Right breast biopsy 10:00: IDC grade 2, ER 95%, PR 30%, Ki-67 15%, HER-2 negative ratio 1.36, 1.7 cm mass in the right upper outer quadrant, T1CN0 stage I a clinical stage      MEDICAL HISTORY:  Past Medical History:  Diagnosis Date  . Abscess in epidural space of L2-L5 lumbar spine 03/2006  . Anemia, iron deficiency   . Arthritis   . Breast cancer ([place]) 06/12/2017   right breast  . CAD (coronary artery disease), native coronary artery 03/30/2015  . Cataract   . Colon polyp    a. Multiple colonic polyps status post colonoscopy in October 2007, consistent with tubular adenoma, tubulovillous adenoma with no high-grade dysplasia or malignancy identified.   . Constipation   . Coronary artery disease    a. s/p CABG 06/2010: S-D2; S-PDA (at time of MV surgery).  . Depression   . Diabetes mellitus    type II  . GERD (gastroesophageal reflux disease)   . Hx of transient ischemic attack (TIA)    a. See stroke section.  . Hyperlipidemia    . Hypertension   . Hypoxia    a. Has history of acute hypoxic respiratory failure in the setting of bronchitis/PNA or prior admissions.  [name] MRSA infection    a. History of recurrent skin infection and soft tissue abscesses, with MRSA positive in the past.  . 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 mitral valve - s/p resection and MV repair 06/2010 Dr. [name].  . 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 Sleep apnea   . Stenosis of middle cerebral artery    a. Distal R MCA.  . Stroke [facility])    a. 11/2009: mitral mass diagnosed at this time, also has distal R MCA stenosis, tx with coumadin. b. Readmitted 05/2010 with TIA symptoms - had not been taking Coumadin. s/p MV surgery 06/2010. Coumadin stopped 2013 after review of chart by Dr. [name] since mass was removed (stroke felt possibly related to this).     SURGICAL HISTORY: Past Surgical History:  Procedure Laterality Date  . CARDIAC CATHETERIZATION  04/03/2015   Procedure: Left Heart Cath and Cors/Grafts Angiography;  Surgeon: Peter M Martinique, MD;  Location: [facility];  Service: Cardiovascular;;  . CORONARY ARTERY BYPASS GRAFT  1/12  . history of ankle fractures requiring surgery    . LUMBAR LAMINECTOMY/DECOMPRESSION MICRODISCECTOMY Left 04/15/2015   Procedure: LUMBAR LAMINECTOMY/DECOMPRESSION MICRODISCECTOMY 1 LEVEL;  Surgeon: Newman Pies, MD;  Location: [facility];  Service: Neurosurgery;  Laterality: Left;  Left L23 microdiskectomy  . MV repair and resection of mass  1/12  . TONSILLECTOMY  1971  . TOTAL ABDOMINAL HYSTERECTOMY  1978  . VESICOVAGINAL FISTULA CLOSURE W/ TAH      SOCIAL HISTORY: Social History   Socioeconomic History  . Marital status: Married    Spouse name: Not on file  . Number of children: 3  . Years of education: Not on file  . Highest education level: Not on file  Social Needs  . Financial resource strain: Not on file  . Food insecurity - worry: Not on file  . Food insecurity -  inability: Not on file  . Transportation needs - medical: Not on file  . Transportation needs - non-medical: Not on file  Occupational History    Employer: UNEMPLOYED  Tobacco Use  . Smoking status: Former Smoker    Last attempt to quit: 10/14/2010    Years since quitting: 6.7  . Smokeless tobacco: Never Used  Substance and Sexual Activity  . Alcohol use: No  . Drug use: No  . Sexual activity: No  Other Topics Concern  . Not on file  Social History Narrative   Lives with husband, stay at home, uses cane occasionally, still active/ambulatory.     FAMILY HISTORY: Family History  Problem Relation Age of Onset  . Colon cancer Mother   . Irritable bowel syndrome Mother   . Prostate cancer Father   . Cirrhosis Sister        died of GI bleed associated with cirrhosis of the liver  . Stroke Brother   . Diabetes Brother        x3  . Kidney disease Brother     ALLERGIES:  has No Known Allergies.  MEDICATIONS:  Current Outpatient Medications  Medication Sig Dispense Refill  . ACCU-CHEK SOFTCLIX LANCETS lancets Use as instructed 100 each 12  . albuterol (PROVENTIL HFA;VENTOLIN HFA) 108 (90 Base) MCG/ACT inhaler Inhale 2 puffs into the lungs every 6 (six) hours as needed for wheezing or shortness of breath. 1 Inhaler 2  . amLODipine (NORVASC) 10 MG tablet Take 1 tablet (10 mg total) by mouth daily. 90 tablet 1  . aspirin EC 81 MG tablet Take 1 tablet (81 mg total) by mouth daily. 30 tablet 5  . atorvastatin (LIPITOR) 40 MG tablet Take 1 tablet (40 mg total) by mouth daily. 90 tablet 1  . Blood Glucose Monitoring Suppl (ACCU-CHEK AVIVA PLUS) w/Device KIT Use as directed 1 kit 0  . buPROPion (WELLBUTRIN XL) 150 MG 24 hr tablet Take 1 tablet (150 mg total) by mouth daily. 90 tablet 1  . carvedilol (COREG) 6.25 MG tablet Take 1 tablet (6.25 mg total) by mouth 2 (two) times daily with a meal. 60 tablet 0  . celecoxib (CELEBREX) 200 MG capsule Take 1 capsule (200 mg total) by mouth 2 (two)  times daily as needed. 60 capsule 3  . diclofenac sodium (VOLTAREN) 1 % GEL Apply 2 g topically 4 (four) times daily as needed. 1 Tube 1  . docusate sodium (COLACE) 100 MG capsule Take 1 capsule (100 mg total) by mouth 2 (two) times daily. (Patient not taking: Reported on 05/23/2016) 60 capsule 0  . glipiZIDE (GLUCOTROL) 5 MG tablet Take 1 tablet (5 mg total) by mouth daily before breakfast. 30 tablet 2  . glucose blood (ACCU-CHEK AVIVA PLUS) test strip Use as instructed 100 each 12  . hydrochlorothiazide (HYDRODIURIL) 25 MG tablet Take 1 tablet (25 mg total) by mouth  daily. 90 tablet 1  . ibuprofen (ADVIL,MOTRIN) 800 MG tablet TAKE 1 TABLET BY MOUTH EVERY 8 HOURS AS NEEDED FOR MODERATE PAIN OR CRAMPING. TAKE WITH FOOD. 30 tablet 0  . losartan (COZAAR) 100 MG tablet Take 1 tablet (100 mg total) by mouth daily. 90 tablet 1  . metFORMIN (GLUCOPHAGE) 1000 MG tablet Take 1 tablet (1,000 mg total) by mouth 2 (two) times daily with a meal. 180 tablet 3  . oxyCODONE-acetaminophen (PERCOCET) 10-325 MG tablet Take 1 tablet by mouth every 4 (four) hours as needed for pain.     [name] oxyCODONE-acetaminophen (PERCOCET) 10-325 MG tablet Take 1 tablet by mouth every 4 (four) hours as needed for pain. (Patient not taking: Reported on 05/23/2016) 100 tablet 0  . pantoprazole (PROTONIX) 40 MG tablet TAKE 1 TABLET EVERY DAY 90 tablet 0  . polyethylene glycol (MIRALAX / GLYCOLAX) packet Take 17 g by mouth daily as needed for mild constipation or moderate constipation. 30 each 2  . traZODone (DESYREL) 50 MG tablet Take 1 tablet (50 mg total) by mouth at bedtime as needed for sleep. 60 tablet 0   No current facility-administered medications for this visit.     REVIEW OF SYSTEMS:   Constitutional: Denies fevers, chills or abnormal night sweats Eyes: Denies blurriness of vision, double vision or watery eyes Ears, nose, mouth, throat, and face: Denies mucositis or sore throat Respiratory: Denies cough, dyspnea or  wheezes Cardiovascular: Denies palpitation, chest discomfort or lower extremity swelling Gastrointestinal:  Denies nausea, heartburn or change in bowel habits Skin: Denies abnormal skin rashes Lymphatics: Denies new lymphadenopathy or easy bruising Neurological:Denies numbness, tingling or new weaknesses Behavioral/Psych: Mood is stable, no new changes  Breast:  Denies any palpable lumps or discharge All other systems were reviewed with the patient and are negative.  PHYSICAL EXAMINATION: ECOG PERFORMANCE STATUS: 1 - Symptomatic but completely ambulatory  Vitals:   06/26/17 1212  BP: (!) 188/73  Pulse: 88  Resp: 17  Temp: 98.6 F (37 C)  SpO2: 100%   Filed Weights   06/26/17 1212  Weight: 244 lb 14.4 oz (111.1 kg)    GENERAL:alert, no distress and comfortable SKIN: skin color, texture, turgor are normal, no rashes or significant lesions EYES: normal, conjunctiva are pink and non-injected, sclera clear OROPHARYNX:no exudate, no erythema and lips, buccal mucosa, and tongue normal  NECK: supple, thyroid normal size, non-tender, without nodularity LYMPH:  no palpable lymphadenopathy in the cervical, axillary or inguinal LUNGS: clear to auscultation and percussion with normal breathing effort HEART: regular rate & rhythm and no murmurs and no lower extremity edema ABDOMEN:abdomen soft, non-tender and normal bowel sounds Musculoskeletal:no cyanosis of digits and no clubbing  PSYCH: alert & oriented x 3 with fluent speech NEURO: no focal motor/sensory deficits  LABORATORY DATA:  I have reviewed the data as listed Lab Results  Component Value Date   WBC 7.7 04/28/2016   HGB 12.3 04/28/2016   HCT 39.5 04/28/2016   MCV 79.3 (L) 04/28/2016   PLT 269 04/28/2016   Lab Results  Component Value Date   NA 139 09/26/2016   K 4.4 09/26/2016   CL 101 09/26/2016   CO2 24 09/26/2016    RADIOGRAPHIC STUDIES: I have personally reviewed the radiological reports and agreed with the  findings in the report.  ASSESSMENT AND PLAN:  Malignant neoplasm of upper-outer quadrant of right breast in female, estrogen receptor positive ([place]) 06/12/2017: Right breast biopsy 10:00: IDC grade 2, ER 95%, PR 30%, Ki-67 15%,  HER-2 negative ratio 1.36, 1.7 cm mass in the right upper outer quadrant, T1CN0 stage I a clinical stage  Pathology and radiology counseling:Discussed with the patient, the details of pathology including the type of breast cancer,the clinical staging, the significance of ER, PR and HER-2/neu receptors and the implications for treatment. After reviewing the pathology in detail, we proceeded to discuss the different treatment options between surgery, radiation, chemotherapy, antiestrogen therapies.  Recommendations: 1. Breast conserving surgery followed by 2. Oncotype DX testing to determine if chemotherapy would be of any benefit followed by 3. Adjuvant radiation therapy followed by 4. Adjuvant antiestrogen therapy  Oncotype counseling: I discussed Oncotype DX test. I explained to the patient that this is a 21 gene panel to evaluate patient tumors DNA to calculate recurrence score. This would help determine whether patient has high risk or intermediate risk or low risk breast cancer. She understands that if her tumor was found to be high risk, she would benefit from systemic chemotherapy. If low risk, no need of chemotherapy. If she was found to be intermediate risk, we would need to evaluate the score as well as other risk factors and determine if an abbreviated chemotherapy may be of benefit.  Because the patient had a previous cardiac issues, she is getting cardiac clearance  Return to clinic after surgery to discuss final pathology report and then determine if Oncotype DX testing will need to be sent.     All questions were answered. The patient knows to call the clinic with any problems, questions or concerns.    Harriette Ohara, MD 06/26/17

## 2017-06-26 NOTE — Assessment & Plan Note (Signed)
06/12/2017: Right breast biopsy 10:00: IDC grade 2, ER 95%, PR 30%, Ki-67 15%, HER-2 negative ratio 1.36, 1.7 cm mass in the right upper outer quadrant, T1CN0 stage I a clinical stage  Pathology and radiology counseling:Discussed with the patient, the details of pathology including the type of breast cancer,the clinical staging, the significance of ER, PR and HER-2/neu receptors and the implications for treatment. After reviewing the pathology in detail, we proceeded to discuss the different treatment options between surgery, radiation, chemotherapy, antiestrogen therapies.  Recommendations: 1. Breast conserving surgery followed by 2. Oncotype DX testing to determine if chemotherapy would be of any benefit followed by 3. Adjuvant radiation therapy followed by 4. Adjuvant antiestrogen therapy  Oncotype counseling: I discussed Oncotype DX test. I explained to the patient that this is a 21 gene panel to evaluate patient tumors DNA to calculate recurrence score. This would help determine whether patient has high risk or intermediate risk or low risk breast cancer. She understands that if her tumor was found to be high risk, she would benefit from systemic chemotherapy. If low risk, no need of chemotherapy. If she was found to be intermediate risk, we would need to evaluate the score as well as other risk factors and determine if an abbreviated chemotherapy may be of benefit.  Return to clinic after surgery to discuss final pathology report and then determine if Oncotype DX testing will need to be sent.

## 2017-06-28 NOTE — Progress Notes (Signed)
Radiation Oncology         [phone] ________________________________  Name: [name]        MRN: [id number]  Date of Service: 06/29/2017 DOB: 07/10/1950  [id number], Maylon Peppers, FNP  Alphonsa Overall, MD     REFERRING PHYSICIAN: Alphonsa Overall, MD   DIAGNOSIS: The encounter diagnosis was Malignant neoplasm of upper-outer quadrant of right breast in female, estrogen receptor positive ([place]).   HISTORY OF PRESENT ILLNESS: [name] is a 67 y.o. female seen at the request of Dr. Lucia Gaskins  for a new diagnosis of right breast cancer. The patient was noted to have a screening detected mass in the upper outer quadrant of the right breast. The underwent diagnostic imaging which revealed concerns for a 2 x 1.7 cm mass at 10:00 in the upper outer quadrant of the right breast as well as a grouping of calcifications that spanned about 5 mm in the upper outer quadrant as well. She underwent a biopsy of these sites on 06/12/17 which revealed a grade invasive ductal carcinoma of the right breast, ER/PR positive, HER2 negative with a Ki 67 of 15%. The other two sites were fibroadenomatous changes that corresponded to the calcifications that were sampled. She is to undergo cardiac clearance tomorrow and schedule her surgery for lumpectomy with sentinel node biopsy thereafter. She will have oncotype Dx score ordered. She comes today to discuss options of adjuvant radiotherapy.    PREVIOUS RADIATION THERAPY: No   PAST MEDICAL HISTORY:  Past Medical History:  Diagnosis Date  . Abscess in epidural space of L2-L5 lumbar spine 03/2006  . Anemia, iron deficiency   . Arthritis   . Breast cancer ([place]) 06/12/2017   right breast  . CAD (coronary artery disease), native coronary artery 03/30/2015  . Cataract   . Colon polyp    a. Multiple colonic polyps status post colonoscopy in October 2007, consistent with tubular adenoma, tubulovillous adenoma with no high-grade dysplasia or malignancy identified.    . Constipation   . Coronary artery disease    a. s/p CABG 06/2010: S-D2; S-PDA (at time of MV surgery).  . Depression   . Diabetes mellitus    type II  . GERD (gastroesophageal reflux disease)   . Hx of transient ischemic attack (TIA)    a. See stroke section.  . Hyperlipidemia   . Hypertension   . Hypoxia    a. Has history of acute hypoxic respiratory failure in the setting of bronchitis/PNA or prior admissions.  [name] MRSA infection    a. History of recurrent skin infection and soft tissue abscesses, with MRSA positive in the past.  . 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 mitral valve - s/p resection and MV repair 06/2010 Dr. [name].  . 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 Sleep apnea   . Stenosis of middle cerebral artery    a. Distal R MCA.  . Stroke [facility])    a. 11/2009: mitral mass diagnosed at this time, also has distal R MCA stenosis, tx with coumadin. b. Readmitted 05/2010 with TIA symptoms - had not been taking Coumadin. s/p MV surgery 06/2010. Coumadin stopped 2013 after review of chart by Dr. [name] since mass was removed (stroke felt possibly related to this).        PAST SURGICAL HISTORY: Past Surgical History:  Procedure Laterality Date  . CARDIAC CATHETERIZATION  04/03/2015   Procedure: Left Heart Cath and Cors/Grafts Angiography;  Surgeon: Peter M Martinique, MD;  Location: [facility]  INVASIVE CV LAB;  Service: Cardiovascular;;  . CORONARY ARTERY BYPASS GRAFT  1/12  . history of ankle fractures requiring surgery    . LUMBAR LAMINECTOMY/DECOMPRESSION MICRODISCECTOMY Left 04/15/2015   Procedure: LUMBAR LAMINECTOMY/DECOMPRESSION MICRODISCECTOMY 1 LEVEL;  Surgeon: Newman Pies, MD;  Location: [place] [facility];  Service: Neurosurgery;  Laterality: Left;  Left L23 microdiskectomy  . MV repair and resection of mass  1/12  . TONSILLECTOMY  1971  . TOTAL ABDOMINAL HYSTERECTOMY  1978  . VESICOVAGINAL FISTULA CLOSURE W/ TAH       FAMILY HISTORY:  Family History  Problem  Relation Age of Onset  . Colon cancer Mother   . Irritable bowel syndrome Mother   . Breast cancer Mother   . Prostate cancer Father   . Cirrhosis Sister        died of GI bleed associated with cirrhosis of the liver  . Stroke Brother   . Colon cancer Brother   . Diabetes Brother        x3  . Kidney disease Brother   . Breast cancer Cousin        several 1st and 2nd cousins with breast cancer     SOCIAL HISTORY:  reports that she quit smoking about 6 years ago. she has never used smokeless tobacco. She reports that she does not drink alcohol or use drugs.   ALLERGIES: Patient has no known allergies.   MEDICATIONS:  Current Outpatient Medications  Medication Sig Dispense Refill  . ACCU-CHEK SOFTCLIX LANCETS lancets Use as instructed 100 each 12  . albuterol (PROVENTIL HFA;VENTOLIN HFA) 108 (90 Base) MCG/ACT inhaler Inhale 2 puffs into the lungs every 6 (six) hours as needed for wheezing or shortness of breath. 1 Inhaler 2  . amLODipine (NORVASC) 10 MG tablet Take 1 tablet (10 mg total) by mouth daily. 90 tablet 1  . aspirin EC 81 MG tablet Take 1 tablet (81 mg total) by mouth daily. 30 tablet 5  . atorvastatin (LIPITOR) 40 MG tablet Take 1 tablet (40 mg total) by mouth daily. 90 tablet 1  . Blood Glucose Monitoring Suppl (ACCU-CHEK AVIVA PLUS) w/Device KIT Use as directed 1 kit 0  . buPROPion (WELLBUTRIN XL) 150 MG 24 hr tablet Take 1 tablet (150 mg total) by mouth daily. 90 tablet 1  . carvedilol (COREG) 6.25 MG tablet Take 1 tablet (6.25 mg total) by mouth 2 (two) times daily with a meal. 60 tablet 0  . celecoxib (CELEBREX) 200 MG capsule Take 1 capsule (200 mg total) by mouth 2 (two) times daily as needed. 60 capsule 3  . docusate sodium (COLACE) 100 MG capsule Take 1 capsule (100 mg total) by mouth 2 (two) times daily. 60 capsule 0  . hydrochlorothiazide (HYDRODIURIL) 25 MG tablet Take 1 tablet (25 mg total) by mouth daily. 90 tablet 1  . ibuprofen (ADVIL,MOTRIN) 800 MG tablet  TAKE 1 TABLET BY MOUTH EVERY 8 HOURS AS NEEDED FOR MODERATE PAIN OR CRAMPING. TAKE WITH FOOD. 30 tablet 0  . losartan (COZAAR) 100 MG tablet Take 1 tablet (100 mg total) by mouth daily. 90 tablet 1  . metFORMIN (GLUCOPHAGE) 1000 MG tablet Take 1 tablet (1,000 mg total) by mouth 2 (two) times daily with a meal. 180 tablet 3  . pantoprazole (PROTONIX) 40 MG tablet TAKE 1 TABLET EVERY DAY 90 tablet 0  . polyethylene glycol (MIRALAX / GLYCOLAX) packet Take 17 g by mouth daily as needed for mild constipation or moderate constipation. 30 each 2  .  traZODone (DESYREL) 50 MG tablet Take 1 tablet (50 mg total) by mouth at bedtime as needed for sleep. 60 tablet 0  . diclofenac sodium (VOLTAREN) 1 % GEL Apply 2 g topically 4 (four) times daily as needed. (Patient not taking: Reported on 06/29/2017) 1 Tube 1  . glipiZIDE (GLUCOTROL) 5 MG tablet Take 1 tablet (5 mg total) by mouth daily before breakfast. 30 tablet 2  . glucose blood (ACCU-CHEK AVIVA PLUS) test strip Use as instructed 100 each 12  . oxyCODONE-acetaminophen (PERCOCET) 10-325 MG tablet Take 1 tablet by mouth every 4 (four) hours as needed for pain.     [name] oxyCODONE-acetaminophen (PERCOCET) 10-325 MG tablet Take 1 tablet by mouth every 4 (four) hours as needed for pain. (Patient not taking: Reported on 05/23/2016) 100 tablet 0   No current facility-administered medications for this encounter.      REVIEW OF SYSTEMS: On review of systems, the patient reports that she is doing well overall. She denies any chest pain, shortness of breath, cough, fevers, chills, night sweats, unintended weight changes. She denies any bowel or bladder disturbances, and denies abdominal pain, nausea or vomiting. She denies any new musculoskeletal or joint aches or pains. A complete review of systems is obtained and is otherwise negative.     PHYSICAL EXAM:  Wt Readings from Last 3 Encounters:  06/29/17 242 lb 9.6 oz (110 kg)  06/26/17 244 lb 14.4 oz (111.1 kg)    05/09/17 248 lb 3.2 oz (112.6 kg)   Temp Readings from Last 3 Encounters:  06/29/17 98.7 F (37.1 C) (Oral)  06/26/17 98.6 F (37 C) (Oral)  04/24/17 97.8 F (36.6 C) (Oral)   BP Readings from Last 3 Encounters:  06/29/17 (!) 174/72  06/26/17 (!) 188/73  05/09/17 121/74   Pulse Readings from Last 3 Encounters:  06/29/17 85  06/26/17 88  05/09/17 74     In general this is a well appearing African American  female in no acute distress. She is alert and oriented x4 and appropriate throughout the examination. HEENT reveals that the patient is normocephalic, atraumatic. EOMs are intact. PERRLA. Skin is intact without any evidence of gross lesions. Cardiovascular exam reveals a regular rate and rhythm, no clicks rubs or murmurs are auscultated. Chest is clear to auscultation bilaterally. Lymphatic assessment is performed and does not reveal any adenopathy in the cervical, supraclavicular, axillary, or inguinal chains. Bilateral breast exam is performed and reveals fullness deep to the biopsy sites on the right breast in the upper outer quadrant. No palpable mass is noted in the left breast. Neither breast has nipple bleeding or discharge.  Abdomen has active bowel sounds in all quadrants and is intact. The abdomen is soft, non tender, non distended. Lower extremities are negative for pretibial pitting edema, deep calf tenderness, cyanosis or clubbing.   ECOG = 0  0 - Asymptomatic (Fully active, able to carry on all predisease activities without restriction)  1 - Symptomatic but completely ambulatory (Restricted in physically strenuous activity but ambulatory and able to carry out work of a light or sedentary nature. For example, light housework, office work)  2 - Symptomatic, <50% in bed during the day (Ambulatory and capable of all self care but unable to carry out any work activities. Up and about more than 50% of waking hours)  3 - Symptomatic, >50% in bed, but not bedbound (Capable  of only limited self-care, confined to bed or chair 50% or more of waking hours)  4 -  Bedbound (Completely disabled. Cannot carry on any self-care. Totally confined to bed or chair)  5 - Death   Eustace Pen MM, Creech RH, Tormey DC, et al. [phone]). "Toxicity and response criteria of the [facility]". [place] Oncol. 5 (6): 649-55    LABORATORY DATA:  Lab Results  Component Value Date   WBC 7.7 04/28/2016   HGB 12.3 04/28/2016   HCT 39.5 04/28/2016   MCV 79.3 (L) 04/28/2016   PLT 269 04/28/2016   Lab Results  Component Value Date   NA 139 09/26/2016   K 4.4 09/26/2016   CL 101 09/26/2016   CO2 24 09/26/2016   Lab Results  Component Value Date   ALT 11 09/26/2016   AST 19 09/26/2016   ALKPHOS 66 09/26/2016   BILITOT 0.3 09/26/2016      RADIOGRAPHY: US Breast Ltd Uni Right Inc Axilla  Result Date: 06/01/2017 CLINICAL DATA:  Screening recall for a right breast mass and upper outer quadrant calcifications. EXAM: 2D DIGITAL DIAGNOSTIC RIGHT MAMMOGRAM WITH CAD AND ADJUNCT TOMO ULTRASOUND RIGHT BREAST COMPARISON:  Previous exam(s). ACR Breast Density Category c: The breast tissue is heterogeneously dense, which may obscure small masses. FINDINGS: On the diagnostic images, the possible mass seen in the posterior, axillary tail region, of the right breast, persists. It is a spiculated mass measuring approximately 1.7 cm in greatest dimension. On the magnification images, there are several groups of similar-appearing calcifications, mildly pleomorphic, in the upper outer quadrant of the right breast. There is no linearity or branching. There is no definite associated mass and no associated architectural distortion. These groups measure up to 5 mm in greatest dimension. Mammographic images were processed with CAD. On physical exam, there is a firm, somewhat fixed, palpable mass along the lateral aspect of the right breast with mild overlying skin dimpling. Targeted  ultrasound is performed, showing an irregular hypoechoic shadowing mass with internal vascularity in the right breast at 10 o'clock, 15 cm from the nipple, corresponding to the palpable abnormality and mammographic abnormalities. Mass measures 2.0 x 1.7 x 1.6 cm. Ultrasound evaluation of the right axilla shows no enlarged or abnormal lymph nodes. IMPRESSION: 1. 2 cm mass in the far lateral aspect of the right breast highly suspicious for breast carcinoma. Biopsy is indicated. 2. Several small groups of indeterminate calcifications in the upper outer quadrant of the right breast. Recommend biopsy of 2 of these groups. RECOMMENDATION: 1. Ultrasound-guided core needle biopsy of far lateral right breast mass. 2. Stereotactic guided core needle biopsy of 2 of upper-outer quadrant right breast calcifications, 1 of the more anterior groups and the more posterior group. These procedures will be scheduled today. I have discussed the findings and recommendations with the patient. Results were also provided in writing at the conclusion of the visit. If applicable, a reminder letter will be sent to the patient regarding the next appointment. BI-RADS CATEGORY  5: Highly suggestive of malignancy. Electronically Signed   By: Lajean Manes M.D.   On: 06/01/2017 11:48   Mm Diag Breast Tomo Uni Right  Result Date: 06/01/2017 CLINICAL DATA:  Screening recall for a right breast mass and upper outer quadrant calcifications. EXAM: 2D DIGITAL DIAGNOSTIC RIGHT MAMMOGRAM WITH CAD AND ADJUNCT TOMO ULTRASOUND RIGHT BREAST COMPARISON:  Previous exam(s). ACR Breast Density Category c: The breast tissue is heterogeneously dense, which may obscure small masses. FINDINGS: On the diagnostic images, the possible mass seen in the posterior, axillary tail region, of the right breast, persists.  It is a spiculated mass measuring approximately 1.7 cm in greatest dimension. On the magnification images, there are several groups of similar-appearing  calcifications, mildly pleomorphic, in the upper outer quadrant of the right breast. There is no linearity or branching. There is no definite associated mass and no associated architectural distortion. These groups measure up to 5 mm in greatest dimension. Mammographic images were processed with CAD. On physical exam, there is a firm, somewhat fixed, palpable mass along the lateral aspect of the right breast with mild overlying skin dimpling. Targeted ultrasound is performed, showing an irregular hypoechoic shadowing mass with internal vascularity in the right breast at 10 o'clock, 15 cm from the nipple, corresponding to the palpable abnormality and mammographic abnormalities. Mass measures 2.0 x 1.7 x 1.6 cm. Ultrasound evaluation of the right axilla shows no enlarged or abnormal lymph nodes. IMPRESSION: 1. 2 cm mass in the far lateral aspect of the right breast highly suspicious for breast carcinoma. Biopsy is indicated. 2. Several small groups of indeterminate calcifications in the upper outer quadrant of the right breast. Recommend biopsy of 2 of these groups. RECOMMENDATION: 1. Ultrasound-guided core needle biopsy of far lateral right breast mass. 2. Stereotactic guided core needle biopsy of 2 of upper-outer quadrant right breast calcifications, 1 of the more anterior groups and the more posterior group. These procedures will be scheduled today. I have discussed the findings and recommendations with the patient. Results were also provided in writing at the conclusion of the visit. If applicable, a reminder letter will be sent to the patient regarding the next appointment. BI-RADS CATEGORY  5: Highly suggestive of malignancy. Electronically Signed   By: Lajean Manes M.D.   On: 06/01/2017 11:48   Mm Clip Placement Right  Result Date: 06/12/2017 CLINICAL DATA:  Status post ultrasound-guided core needle biopsy of a 2.0 cm mass in the 10 o'clock position of the right breast and two 4 mm groups of indeterminate  calcifications in the upper-outer quadrant of the right breast. EXAM: DIAGNOSTIC RIGHT MAMMOGRAM POST ULTRASOUND AND STEREOTACTIC BIOPSIES COMPARISON:  Previous exam(s). FINDINGS: Mammographic images were obtained following ultrasound and stereotactic guided biopsies of the right breast. These demonstrate an X shaped biopsy marker clip at the location of the 4 mm group of biopsied calcifications in the upper-outer quadrant of the right breast and a coil shaped biopsy marker clip at the location of the biopsied calcifications in the posterior aspect of the upper-outer quadrant of the right breast. These clips are located 3.5 cm apart. There are no residual calcifications at the either biopsy site. There is also a ribbon shaped biopsy marker clip within the anterior aspect of the biopsied mass in the posterior aspect of the 10 o'clock position of the right breast. This is located 12.8 cm from the X shaped biopsy marker clip and 10.3 cm from the coil shaped biopsy marker clip. The mass and X shaped biopsy marker clip encompass an area measuring 13.8 cm. IMPRESSION: Appropriate deployment of the ribbon shaped, X shaped and coil shaped biopsy marker clips. Final Assessment: Post Procedure Mammograms for Marker Placement Electronically Signed   By: Claudie Revering M.D.   On: 06/12/2017 12:27   Mm Rt Breast Bx W Loc Dev 1st Lesion Image Bx Spec Stereo Guide  Addendum Date: 06/13/2017   ADDENDUM REPORT: 06/13/2017 15:17 ADDENDUM: Pathology revealed GRADE II INVASIVE DUCTAL CARCINOMA of the Right breast, 10:00 o'clock. FIBROADENOMATOID NODULE WITH CALCIFICATIONS of the Right breast, upper outer quadrant, anterior. FIBROCYSTIC CHANGES AND FIBROADENOMATOID  NODULE WITH CALCIFICATIONS of the Right breast, upper outer quadrant, posterior. This was found to be concordant by Dr. Claudie Revering. Pathology results were discussed with the patient by telephone. The patient reported doing well after the biopsies with tenderness at the  sites. Post biopsy instructions and care were reviewed and questions were answered. The patient was encouraged to call [facility] for any additional concerns. Surgical consultation has been arranged with Dr. Alphonsa Overall at [facility] on June 14, 2017. Pathology results reported by Terie Purser, RN on 06/13/2017. Electronically Signed   By: Claudie Revering M.D.   On: 06/13/2017 15:17   Result Date: 06/13/2017 CLINICAL DATA:  4 mm group of indeterminate calcifications in the anterior aspect of the upper-outer quadrant of the right breast at recent mammography. EXAM: RIGHT BREAST STEREOTACTIC CORE NEEDLE BIOPSY COMPARISON:  Previous exams. FINDINGS: The patient and I discussed the procedure of stereotactic-guided biopsy including benefits and alternatives. We discussed the high likelihood of a successful procedure. We discussed the risks of the procedure including infection, bleeding, tissue injury, clip migration, and inadequate sampling. Informed written consent was given. The usual time out protocol was performed immediately prior to the procedure. Using sterile technique and 1% Lidocaine as local anesthetic, under stereotactic guidance, a 9 gauge vacuum assisted device was used to perform core needle biopsy of the recently demonstrated 4 mm group of calcifications in the anterior aspect of the upper-outer quadrant of the right breast using a cephalad approach. Specimen radiograph was performed showing multiple calcifications in both of the specimens. Specimens with calcifications are identified for pathology. Lesion quadrant: Upper outer quadrant At the conclusion of the procedure, a X shaped tissue marker clip was deployed into the biopsy cavity. Follow-up 2-view mammogram was performed and dictated separately. IMPRESSION: Stereotactic-guided biopsy of a 4 mm group of calcifications in the anterior aspect of the upper-outer quadrant of the right breast. No apparent  complications. Electronically Signed: By: Claudie Revering M.D. On: 06/12/2017 12:19   Mm Rt Breast Bx W Loc Dev Ea Ad Lesion Img Bx Spec Stereo Guide  Addendum Date: 06/13/2017   ADDENDUM REPORT: 06/13/2017 15:20 ADDENDUM: Pathology revealed GRADE II INVASIVE DUCTAL CARCINOMA of the Right breast, 10:00 o'clock. FIBROADENOMATOID NODULE WITH CALCIFICATIONS of the Right breast, upper outer quadrant, anterior. FIBROCYSTIC CHANGES AND FIBROADENOMATOID NODULE WITH CALCIFICATIONS of the Right breast, upper outer quadrant, posterior. This was found to be concordant by Dr. Claudie Revering. Pathology results were discussed with the patient by telephone. The patient reported doing well after the biopsies with tenderness at the sites. Post biopsy instructions and care were reviewed and questions were answered. The patient was encouraged to call [facility] for any additional concerns. Surgical consultation has been arranged with Dr. Alphonsa Overall at [facility] on June 14, 2017. Pathology results reported by Terie Purser, RN on 06/13/2017. Electronically Signed   By: Claudie Revering M.D.   On: 06/13/2017 15:20   Result Date: 06/13/2017 CLINICAL DATA:  4 mm group of indeterminate calcifications more posteriorly located in the upper-outer quadrant of the right breast than 2 similar-appearing groups at recent mammography. EXAM: RIGHT BREAST STEREOTACTIC CORE NEEDLE BIOPSY COMPARISON:  Previous exams. FINDINGS: The patient and I discussed the procedure of stereotactic-guided biopsy including benefits and alternatives. We discussed the high likelihood of a successful procedure. We discussed the risks of the procedure including infection, bleeding, tissue injury, clip migration, and inadequate sampling. Informed written consent was  given. The usual time out protocol was performed immediately prior to the procedure. Using sterile technique and 1% Lidocaine as local anesthetic, under  stereotactic guidance, a 9 gauge vacuum assisted device was used to perform core needle biopsy of the recently demonstrated 4 mm group of calcifications located posteriorly in the upper-outer quadrant of the right breast using a cephalad approach. Specimen radiograph was performed showing multiple calcifications within one of the specimens. The specimen with calcifications was identified for pathology. Lesion quadrant: Upper outer quadrant At the conclusion of the procedure, a coil shaped tissue marker clip was deployed into the biopsy cavity. Follow-up 2-view mammogram was performed and dictated separately. IMPRESSION: Stereotactic-guided biopsy of a 4 mm group of calcifications located posteriorly in the upper-outer quadrant of the right breast. No apparent complications. Electronically Signed: By: Claudie Revering M.D. On: 06/12/2017 12:21   [place] Rt Breast Bx W Loc Dev 1st Lesion Img Bx Spec US Guide  Addendum Date: 06/13/2017   ADDENDUM REPORT: 06/13/2017 15:20 ADDENDUM: Pathology revealed GRADE II INVASIVE DUCTAL CARCINOMA of the Right breast, 10:00 o'clock. FIBROADENOMATOID NODULE WITH CALCIFICATIONS of the Right breast, upper outer quadrant, anterior. FIBROCYSTIC CHANGES AND FIBROADENOMATOID NODULE WITH CALCIFICATIONS of the Right breast, upper outer quadrant, posterior. This was found to be concordant by Dr. Claudie Revering. Pathology results were discussed with the patient by telephone. The patient reported doing well after the biopsies with tenderness at the sites. Post biopsy instructions and care were reviewed and questions were answered. The patient was encouraged to call [facility] for any additional concerns. Surgical consultation has been arranged with Dr. Alphonsa Overall at [facility] on June 14, 2017. Pathology results reported by Terie Purser, RN on 06/13/2017. Electronically Signed   By: Claudie Revering M.D.   On: 06/13/2017 15:20   Result Date:  06/13/2017 CLINICAL DATA:  2.0 cm mass highly suspicious for malignancy in the 10 o'clock position of the right breast at recent mammography and ultrasound. EXAM: ULTRASOUND GUIDED RIGHT BREAST CORE NEEDLE BIOPSY COMPARISON:  Previous exam(s). FINDINGS: I met with the patient and we discussed the procedure of ultrasound-guided biopsy, including benefits and alternatives. We discussed the high likelihood of a successful procedure. We discussed the risks of the procedure, including infection, bleeding, tissue injury, clip migration, and inadequate sampling. Informed written consent was given. The usual time-out protocol was performed immediately prior to the procedure. Lesion quadrant: Upper outer quadrant Using sterile technique and 1% Lidocaine as local anesthetic, under direct ultrasound visualization, a 12 gauge spring-loaded device was used to perform biopsy of the recently demonstrated 2.0 cm mass in the 10 o'clock position of the right breast using a caudal approach. At the conclusion of the procedure a ribbon shaped tissue marker clip was deployed into the biopsy cavity. Follow up 2 view mammogram was performed and dictated separately. IMPRESSION: Ultrasound guided biopsy of a 2.0 cm mass in the 10 o'clock position of the right breast. No apparent complications. Electronically Signed: By: Claudie Revering M.D. On: 06/12/2017 11:15       IMPRESSION/PLAN: 1. Stage IA, [id number], grade 2, ER/PR positive invasive ductal carcinoma of the right breast. Dr. Lisbeth Renshaw discusses the pathology findings and reviews the nature of invasive breast disease. Once she undergoes cardiac clearance, she will undergo breast conservation with lumpectomy with  sentinel mapping. Her tumor will also be tested for oncotype dx score to determine a role for systemic therapy. Provided that chemotherapy is not indicated, the patient's course would  then be followed by external radiotherapy to the breast followed by antiestrogen therapy. We  discussed the risks, benefits, short, and long term effects of radiotherapy, and the patient is interested in proceeding. Dr. Lisbeth Renshaw discusses the delivery and logistics of radiotherapy and anticipates a course of 4 or 6 1/2 weeks. We will see her back about 2 weeks after surgery to move forward with the simulation and planning process and anticipate starting radiotherapy about 4 weeks after surgery.  2. Possible genetic predisposition to malignancy. Given her personal and family history of breast cancer, she is interested in testing. A referral will be made for genetic counseling.   The above documentation reflects my direct findings during this shared patient visit. Please see the separate note by Dr. Lisbeth Renshaw on this date for the remainder of the patient's plan of care.    Carola Rhine, PAC

## 2017-06-29 ENCOUNTER — Ambulatory Visit: Payer: Medicare PPO | Admitting: Cardiology

## 2017-06-29 ENCOUNTER — Ambulatory Visit
Admission: RE | Admit: 2017-06-29 | Discharge: 2017-06-29 | Disposition: A | Discharge status: Home / Self Care | Payer: Medicare PPO | Source: Ambulatory Visit | Attending: Radiation Oncology | Admitting: Radiation Oncology

## 2017-06-29 ENCOUNTER — Encounter: Payer: Self-pay | Admitting: Radiation Oncology

## 2017-06-29 VITALS — BP 174/72 | HR 85 | Temp 98.7°F | Resp 20 | Ht 67.0 in | Wt 242.6 lb

## 2017-06-29 DIAGNOSIS — Z7984 Long term (current) use of oral hypoglycemic drugs: Secondary | ICD-10-CM | POA: Diagnosis not present

## 2017-06-29 DIAGNOSIS — Z170 Estrogen receptor positive status [ER+]: Secondary | ICD-10-CM | POA: Insufficient documentation

## 2017-06-29 DIAGNOSIS — Z951 Presence of aortocoronary bypass graft: Secondary | ICD-10-CM | POA: Diagnosis not present

## 2017-06-29 DIAGNOSIS — Z803 Family history of malignant neoplasm of breast: Secondary | ICD-10-CM | POA: Diagnosis not present

## 2017-06-29 DIAGNOSIS — Z87891 Personal history of nicotine dependence: Secondary | ICD-10-CM | POA: Diagnosis not present

## 2017-06-29 DIAGNOSIS — Z9889 Other specified postprocedural states: Secondary | ICD-10-CM | POA: Insufficient documentation

## 2017-06-29 DIAGNOSIS — I10 Essential (primary) hypertension: Secondary | ICD-10-CM | POA: Diagnosis not present

## 2017-06-29 DIAGNOSIS — E119 Type 2 diabetes mellitus without complications: Secondary | ICD-10-CM | POA: Insufficient documentation

## 2017-06-29 DIAGNOSIS — Z8673 Personal history of transient ischemic attack (TIA), and cerebral infarction without residual deficits: Secondary | ICD-10-CM | POA: Insufficient documentation

## 2017-06-29 DIAGNOSIS — I2510 Atherosclerotic heart disease of native coronary artery without angina pectoris: Secondary | ICD-10-CM | POA: Insufficient documentation

## 2017-06-29 DIAGNOSIS — C50411 Malignant neoplasm of upper-outer quadrant of right female breast: Secondary | ICD-10-CM | POA: Insufficient documentation

## 2017-06-29 DIAGNOSIS — Z90710 Acquired absence of both cervix and uterus: Secondary | ICD-10-CM | POA: Diagnosis not present

## 2017-06-29 DIAGNOSIS — G4730 Sleep apnea, unspecified: Secondary | ICD-10-CM | POA: Insufficient documentation

## 2017-06-29 DIAGNOSIS — K219 Gastro-esophageal reflux disease without esophagitis: Secondary | ICD-10-CM | POA: Diagnosis not present

## 2017-06-29 DIAGNOSIS — Z79899 Other long term (current) drug therapy: Secondary | ICD-10-CM | POA: Diagnosis not present

## 2017-06-29 DIAGNOSIS — F329 Major depressive disorder, single episode, unspecified: Secondary | ICD-10-CM | POA: Insufficient documentation

## 2017-06-29 DIAGNOSIS — Z7982 Long term (current) use of aspirin: Secondary | ICD-10-CM | POA: Diagnosis not present

## 2017-06-29 DIAGNOSIS — E785 Hyperlipidemia, unspecified: Secondary | ICD-10-CM | POA: Diagnosis not present

## 2017-06-29 DIAGNOSIS — Z809 Family history of malignant neoplasm, unspecified: Secondary | ICD-10-CM

## 2017-06-29 HISTORY — DX: Malignant neoplasm of unspecified site of unspecified female breast: C50.919

## 2017-06-29 NOTE — Progress Notes (Signed)
Please see the Nurse Progress Note in the MD Initial Consult Encounter for this patient. 

## 2017-06-30 ENCOUNTER — Ambulatory Visit (INDEPENDENT_AMBULATORY_CARE_PROVIDER_SITE_OTHER): Payer: Medicare PPO | Admitting: Cardiology

## 2017-06-30 ENCOUNTER — Encounter: Payer: Self-pay | Admitting: Cardiology

## 2017-06-30 VITALS — BP 142/72 | HR 81 | Ht 67.0 in | Wt 239.8 lb

## 2017-06-30 DIAGNOSIS — Z01818 Encounter for other preprocedural examination: Secondary | ICD-10-CM | POA: Diagnosis not present

## 2017-06-30 DIAGNOSIS — I10 Essential (primary) hypertension: Secondary | ICD-10-CM | POA: Diagnosis not present

## 2017-06-30 NOTE — Progress Notes (Signed)
06/30/2017 [name]   09-Mar-1951  [id number]  Primary Physician Alfonse Spruce, FNP Primary Cardiologist: Dr. Radford Pax  Reason for Visit/CC: Preoperative cardiac evaluation/ surgical clearance  HPI:  [name] is a 67 y.o. female who is being seen today for preoperative cardiovascular examination prior to undergoing surgery. She was recently diagnosed with breast cancer and needs lumpectomy.   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 [name] in 2012 . Also prior history of CVA, DM (not on insulin), HTN and HLD. She has not been seen since 2016.   She notes that she has done well. She denies any cardiac symptoms. No chest pain or dyspnea. She is able to perform >4 METS of activity (walk up a flight of stairs and 4 blocks) without exertional CP. She also notes no syncope/ near syncope, palpitations.   EKG shows NSR.   Current Meds  Medication Sig  . ACCU-CHEK SOFTCLIX LANCETS lancets Use as instructed  . albuterol (PROVENTIL HFA;VENTOLIN HFA) 108 (90 Base) MCG/ACT inhaler Inhale 2 puffs into the lungs every 6 (six) hours as needed for wheezing or shortness of breath.  [name] amLODipine (NORVASC) 10 MG tablet Take 1 tablet (10 mg total) by mouth daily.  [name] aspirin EC 81 MG tablet Take 1 tablet (81 mg total) by mouth daily.  [name] atorvastatin (LIPITOR) 40 MG tablet Take 1 tablet (40 mg total) by mouth daily.  . Blood Glucose Monitoring Suppl (ACCU-CHEK AVIVA PLUS) w/Device KIT Use as directed  . buPROPion (WELLBUTRIN XL) 150 MG 24 hr tablet Take 1 tablet (150 mg total) by mouth daily.  . carvedilol (COREG) 6.25 MG tablet Take 1 tablet (6.25 mg total) by mouth 2 (two) times daily with a meal.  . celecoxib (CELEBREX) 200 MG capsule Take 1 capsule (200 mg total) by mouth 2 (two) times daily as needed.  . diclofenac sodium (VOLTAREN) 1 % GEL Apply 2 g topically 4  (four) times daily as needed.  . docusate sodium (COLACE) 100 MG capsule Take 1 capsule (100 mg total) by mouth 2 (two) times daily.  [name] glipiZIDE (GLUCOTROL) 5 MG tablet Take 1 tablet (5 mg total) by mouth daily before breakfast.  . glucose blood (ACCU-CHEK AVIVA PLUS) test strip Use as instructed  . hydrochlorothiazide (HYDRODIURIL) 25 MG tablet Take 1 tablet (25 mg total) by mouth daily.  [name] ibuprofen (ADVIL,MOTRIN) 800 MG tablet TAKE 1 TABLET BY MOUTH EVERY 8 HOURS AS NEEDED FOR MODERATE PAIN OR CRAMPING. TAKE WITH FOOD.  [name] losartan (COZAAR) 100 MG tablet Take 1 tablet (100 mg total) by mouth daily.  . metFORMIN (GLUCOPHAGE) 1000 MG tablet Take 1 tablet (1,000 mg total) by mouth 2 (two) times daily with a meal.  . pantoprazole (PROTONIX) 40 MG tablet TAKE 1 TABLET EVERY DAY  . polyethylene glycol (MIRALAX / GLYCOLAX) packet Take 17 g by mouth daily as needed for mild constipation or moderate constipation.  . traZODone (DESYREL) 50 MG tablet Take 1 tablet (50 mg total) by mouth at bedtime as needed for sleep.   No Known Allergies Past Medical History:  Diagnosis Date  . Abscess in epidural space of L2-L5 lumbar spine 03/2006  . Anemia, iron deficiency   . Arthritis   . Breast cancer ([place]) 06/12/2017   right breast  . CAD (coronary artery disease), native coronary artery 03/30/2015  . Cataract   .  Colon polyp    a. Multiple colonic polyps status post colonoscopy in October 2007, consistent with tubular adenoma, tubulovillous adenoma with no high-grade dysplasia or malignancy identified.   . Constipation   . Coronary artery disease    a. s/p CABG 06/2010: S-D2; S-PDA (at time of MV surgery).  . Depression   . Diabetes mellitus    type II  . GERD (gastroesophageal reflux disease)   . Hx of transient ischemic attack (TIA)    a. See stroke section.  . Hyperlipidemia   . Hypertension   . Hypoxia    a. Has history of acute hypoxic respiratory failure in the setting of bronchitis/PNA or  prior admissions.  [name] MRSA infection    a. History of recurrent skin infection and soft tissue abscesses, with MRSA positive in the past.  . 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 mitral valve - s/p resection and MV repair 06/2010 Dr. [name].  . 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 Sleep apnea   . Stenosis of middle cerebral artery    a. Distal R MCA.  . Stroke [facility])    a. 11/2009: mitral mass diagnosed at this time, also has distal R MCA stenosis, tx with coumadin. b. Readmitted 05/2010 with TIA symptoms - had not been taking Coumadin. s/p MV surgery 06/2010. Coumadin stopped 2013 after review of chart by Dr. [name] since mass was removed (stroke felt possibly related to this).    Family History  Problem Relation Age of Onset  . Colon cancer Mother   . Irritable bowel syndrome Mother   . Breast cancer Mother   . Prostate cancer Father   . Cirrhosis Sister        died of GI bleed associated with cirrhosis of the liver  . Stroke Brother   . Colon cancer Brother   . Diabetes Brother        x3  . Kidney disease Brother   . Breast cancer Cousin        several 1st and 2nd cousins with breast cancer   Past Surgical History:  Procedure Laterality Date  . CARDIAC CATHETERIZATION  04/03/2015   Procedure: Left Heart Cath and Cors/Grafts Angiography;  Surgeon: Peter M Martinique, MD;  Location: [place] [facility];  Service: Cardiovascular;;  . CORONARY ARTERY BYPASS GRAFT  1/12  . history of ankle fractures requiring surgery    . LUMBAR LAMINECTOMY/DECOMPRESSION MICRODISCECTOMY Left 04/15/2015   Procedure: LUMBAR LAMINECTOMY/DECOMPRESSION MICRODISCECTOMY 1 LEVEL;  Surgeon: Newman Pies, MD;  Location: [place] [facility];  Service: Neurosurgery;  Laterality: Left;  Left L23 microdiskectomy  . MV repair and resection of mass  1/12  . TONSILLECTOMY  1971  . TOTAL ABDOMINAL HYSTERECTOMY  1978  . VESICOVAGINAL FISTULA CLOSURE W/ TAH     Social History   Socioeconomic History  . Marital  status: Married    Spouse name: Not on file  . Number of children: 3  . Years of education: Not on file  . Highest education level: Not on file  Social Needs  . Financial resource strain: Not on file  . Food insecurity - worry: Not on file  . Food insecurity - inability: Not on file  . Transportation needs - medical: Not on file  . Transportation needs - non-medical: Not on file  Occupational History    Employer: UNEMPLOYED  Tobacco Use  . Smoking status: Former Smoker    Last attempt to quit: 10/14/2010    Years since quitting: 6.7  .  Smokeless tobacco: Never Used  Substance and Sexual Activity  . Alcohol use: No  . Drug use: No  . Sexual activity: No  Other Topics Concern  . Not on file  Social History Narrative   Lives with husband, stay at home, uses cane occasionally, still active/ambulatory.      Review of Systems: General: negative for chills, fever, night sweats or weight changes.  Cardiovascular: negative for chest pain, dyspnea on exertion, edema, orthopnea, palpitations, paroxysmal nocturnal dyspnea or shortness of breath Dermatological: negative for rash Respiratory: negative for cough or wheezing Urologic: negative for hematuria Abdominal: negative for nausea, vomiting, diarrhea, bright red blood per rectum, melena, or hematemesis Neurologic: negative for visual changes, syncope, or dizziness All other systems reviewed and are otherwise negative except as noted above.   Physical Exam:  Blood pressure (!) 142/72, pulse 81, height '5\' 7"'$  (1.702 m), weight 239 lb 12.8 oz (108.8 kg), SpO2 97 %.  General appearance: alert, cooperative and no distress Neck: no carotid bruit and no JVD Lungs: clear to auscultation bilaterally Heart: regular rate and rhythm, S1, S2 normal, no murmur, click, rub or gallop Extremities: extremities normal, atraumatic, no cyanosis or edema Pulses: 2+ and symmetric Skin: Skin color, texture, turgor normal. No rashes or  lesions Neurologic: Grossly normal  EKG NSR -- personally reviewed   ASSESSMENT AND PLAN:   1. CAD: h/o CABG in 2012. She denies any anginal symptoms. No exertional CP or dyspnea. Continue ASA, statin and BB during the perioperative period.   2. Valvular Disease: h/o papillary fibroelastoma of the MV, s/p resection of fibroelastoma and MV repair by Dr. [name] in 2012. She denies dyspnea and chest pain. No notable murmurs on exam.   3. HTN: controlled on current regimen.   4. DM: oral agents only. No insulin. Followed by PCP.   5. HLD: on statin therapy with Lipitor. Lipids are followed by PCP.   6. Preoperative Assessment:  According to the Revised Cardiac Risk Index (RCRI), her Perioperative Risk of Major Cardiac Event is (%): 0.9   She can also perform > 4 METS of physical activity (walk a flight of stairs and ambulate 4 blocks) w/o exertional CP or dyspnea. For this reason, she will not require further cardiac testing prior to surgery. She can be cleared for noncardiac surgery and is of acceptable risk, <1% risk for major cardiac events based on the RCRI calculator. We recommend continuation of ASA, statin and beta blocker during the perioperative period.   Follow-Up w/ Dr. Radford Pax in 1 year.   Brittainy Ladoris Gene, MHS [facility] 06/30/2017 12:29 PM

## 2017-06-30 NOTE — Patient Instructions (Signed)
Medication Instructions:  Your physician recommends that you continue on your current medications as directed. Please refer to the Current Medication list given to you today.  Labwork: NONE  Testing/Procedures: NONE  Follow-Up: Your physician wants you to follow-up in: 1 year with Dr. Radford Pax .You will receive a reminder letter in the mail two months in advance. If you don't receive a letter, please call our office to schedule the follow-up appointment.   Any Other Special Instructions Will Be Listed Below (If Applicable).     If you need a refill on your cardiac medications before your next appointment, please call your pharmacy.

## 2017-07-02 ENCOUNTER — Ambulatory Visit: Payer: Self-pay | Admitting: Surgery

## 2017-07-02 DIAGNOSIS — Z170 Estrogen receptor positive status [ER+]: Principal | ICD-10-CM

## 2017-07-02 DIAGNOSIS — C50911 Malignant neoplasm of unspecified site of right female breast: Secondary | ICD-10-CM

## 2017-07-10 ENCOUNTER — Other Ambulatory Visit: Payer: Self-pay | Admitting: Pharmacist

## 2017-07-10 DIAGNOSIS — E782 Mixed hyperlipidemia: Secondary | ICD-10-CM

## 2017-07-10 DIAGNOSIS — F329 Major depressive disorder, single episode, unspecified: Secondary | ICD-10-CM

## 2017-07-10 DIAGNOSIS — E1165 Type 2 diabetes mellitus with hyperglycemia: Secondary | ICD-10-CM

## 2017-07-10 DIAGNOSIS — E0800 Diabetes mellitus due to underlying condition with hyperosmolarity without nonketotic hyperglycemic-hyperosmolar coma (NKHHC): Secondary | ICD-10-CM

## 2017-07-10 DIAGNOSIS — I10 Essential (primary) hypertension: Secondary | ICD-10-CM

## 2017-07-10 DIAGNOSIS — F32A Depression, unspecified: Secondary | ICD-10-CM

## 2017-07-10 DIAGNOSIS — Z760 Encounter for issue of repeat prescription: Secondary | ICD-10-CM

## 2017-07-10 MED ORDER — ATORVASTATIN CALCIUM 40 MG PO TABS: 40.0000 mg | ORAL_TABLET | Freq: Every day | ORAL | 0 refills | Status: DC

## 2017-07-10 MED ORDER — GLIPIZIDE 5 MG PO TABS: 5.0000 mg | ORAL_TABLET | Freq: Every day | ORAL | 0 refills | Status: DC

## 2017-07-10 MED ORDER — METFORMIN HCL 1000 MG PO TABS: 1000.0000 mg | ORAL_TABLET | Freq: Two times a day (BID) | ORAL | 0 refills | Status: DC

## 2017-07-10 MED ORDER — HYDROCHLOROTHIAZIDE 25 MG PO TABS: 25.0000 mg | ORAL_TABLET | Freq: Every day | ORAL | 0 refills | Status: DC

## 2017-07-10 MED ORDER — ACCU-CHEK AVIVA PLUS W/DEVICE KIT: PACK | 0 refills | Status: DC

## 2017-07-10 MED ORDER — BUPROPION HCL ER (XL) 150 MG PO TB24: 150.0000 mg | ORAL_TABLET | Freq: Every day | ORAL | 0 refills | Status: DC

## 2017-07-10 MED ORDER — AMLODIPINE BESYLATE 10 MG PO TABS: 10.0000 mg | ORAL_TABLET | Freq: Every day | ORAL | 0 refills | Status: DC

## 2017-07-10 MED ORDER — ACCU-CHEK SOFTCLIX LANCETS MISC: 12 refills | Status: DC

## 2017-07-10 MED ORDER — TRAZODONE HCL 50 MG PO TABS: 50.0000 mg | ORAL_TABLET | Freq: Every evening | ORAL | 0 refills | Status: DC | PRN

## 2017-07-10 MED ORDER — CARVEDILOL 6.25 MG PO TABS: 6.2500 mg | ORAL_TABLET | Freq: Two times a day (BID) | ORAL | 0 refills | Status: DC

## 2017-07-10 MED ORDER — ALBUTEROL SULFATE HFA 108 (90 BASE) MCG/ACT IN AERS: 2.0000 | INHALATION_SPRAY | Freq: Four times a day (QID) | RESPIRATORY_TRACT | 2 refills | Status: DC | PRN

## 2017-07-10 MED ORDER — PANTOPRAZOLE SODIUM 40 MG PO TBEC: 40.0000 mg | DELAYED_RELEASE_TABLET | Freq: Every day | ORAL | 0 refills | Status: DC

## 2017-07-10 MED ORDER — GLUCOSE BLOOD VI STRP: ORAL_STRIP | 12 refills | Status: DC

## 2017-07-11 ENCOUNTER — Other Ambulatory Visit: Payer: Self-pay | Admitting: Surgery

## 2017-07-11 DIAGNOSIS — Z170 Estrogen receptor positive status [ER+]: Principal | ICD-10-CM

## 2017-07-11 DIAGNOSIS — C50911 Malignant neoplasm of unspecified site of right female breast: Secondary | ICD-10-CM

## 2017-07-13 ENCOUNTER — Telehealth: Payer: Self-pay | Admitting: Hematology and Oncology

## 2017-07-13 NOTE — Telephone Encounter (Signed)
Spoke to patient regarding upcoming January appointments per 1/16 sch message  °

## 2017-07-13 NOTE — Pre-Procedure Instructions (Signed)
[name]  07/13/2017      [facility] [place], [place] - [address] [place] [address] Phone: [phone] Fax: [phone]    Your procedure is scheduled on Thursday, July 20, 2017  Report to [facility] Admitting Entrance "A" at 5:30AM   Call this number if you have problems the morning of surgery:  [phone]   Remember:  Do not eat food or drink liquids after midnight.  Take these medicines the morning of surgery with A SIP OF WATER: AmLODipine (NORVASC), BuPROPion (WELLBUTRIN XL), Carvedilol (COREG), and Pantoprazole (PROTONIX). If needed Albuterol Inhaler for cough or wheezing (Bring with you the day of surgery).  Follow your doctor's instruction regarding Aspirin.  As of today, stop taking all Aspirins, Vitamins, Fish oils, and Herbal medications. Also stop all NSAIDS i.e. Advil, Ibuprofen, Motrin, Aleve, Anaprox, Naproxen, BC and Goody Powders.  Please complete your PRE-SURGERY ENSURE that was given to before you leave your house the morning of surgery.  Please, if able, drink it in one setting. DO NOT SIP.  How to Manage Your Diabetes Before and After Surgery  Why is it important to control my blood sugar before and after surgery? . Improving blood sugar levels before and after surgery helps healing and can limit problems. . A way of improving blood sugar control is eating a healthy diet by: o  Eating less sugar and carbohydrates o  Increasing activity/exercise o  Talking with your doctor about reaching your blood sugar goals . High blood sugars (greater than 180 mg/dL) can raise your risk of infections and slow your recovery, so you will need to focus on controlling your diabetes during the weeks before surgery. . Make sure that the doctor who takes care of your diabetes knows about your planned surgery including the date and location.  How do I manage my blood sugar before  surgery? . Check your blood sugar at least 4 times a day, starting 2 days before surgery, to make sure that the level is not too high or low. o Check your blood sugar the morning of your surgery when you wake up and every 2 hours until you get to the Short Stay unit. . If your blood sugar is less than 70 mg/dL, you will need to treat for low blood sugar: o Do not take insulin. o Treat a low blood sugar (less than 70 mg/dL) with  cup of clear juice (cranberry or apple), 4 glucose tablets, OR glucose gel. Recheck blood sugar in 15 minutes after treatment (to make sure it is greater than 70 mg/dL). If your blood sugar is not greater than 70 mg/dL on recheck, call [phone] o  for further instructions. . Report your blood sugar to the short stay nurse when you get to Short Stay.  . If you are admitted to the hospital after surgery: o Your blood sugar will be checked by the staff and you will probably be given insulin after surgery (instead of oral diabetes medicines) to make sure you have good blood sugar levels. o The goal for blood sugar control after surgery is 80-180 mg/dL.  WHAT DO I DO ABOUT MY DIABETES MEDICATION?  [name] Do not take GlipiZIDE (GLUCOTROL) and MetFORMIN (GLUCOPHAGE) the morning of surgery.  . If your CBG is greater than 220 mg/dL, call us at [phone]   Do not wear jewelry, make-up or nail polish.  Do not wear lotions, powders, perfumes, or deodorant.  Do not shave 48 hours prior to surgery.   Do not bring valuables to the hospital.  [facility] is not responsible for any belongings or valuables.  Contacts, dentures or bridgework may not be worn into surgery.  Leave your suitcase in the car.  After surgery it may be brought to your room.  For patients admitted to the hospital, discharge time will be determined by your treatment team.  Patients discharged the day of surgery will not be allowed to drive home.   Special instructions:   [place]- Preparing For  Surgery  Before surgery, you can play an important role. Because skin is not sterile, your skin needs to be as free of germs as possible. You can reduce the number of germs on your skin by washing with CHG (chlorahexidine gluconate) Soap before surgery.  CHG is an antiseptic cleaner which kills germs and bonds with the skin to continue killing germs even after washing.  Please do not use if you have an allergy to CHG or antibacterial soaps. If your skin becomes reddened/irritated stop using the CHG.  Do not shave (including legs and underarms) for at least 48 hours prior to first CHG shower. It is OK to shave your face.  Please follow these instructions carefully.   1. Shower the NIGHT BEFORE SURGERY and the MORNING OF SURGERY with CHG.   2. If you chose to wash your hair, wash your hair first as usual with your normal shampoo.  3. After you shampoo, rinse your hair and body thoroughly to remove the shampoo.  4. Use CHG as you would any other liquid soap. You can apply CHG directly to the skin and wash gently with a scrungie or a clean washcloth.   5. Apply the CHG Soap to your body ONLY FROM THE NECK DOWN.  Do not use on open wounds or open sores. Avoid contact with your eyes, ears, mouth and genitals (private parts). Wash Face and genitals (private parts)  with your normal soap.  6. Wash thoroughly, paying special attention to the area where your surgery will be performed.  7. Thoroughly rinse your body with warm water from the neck down.  8. DO NOT shower/wash with your normal soap after using and rinsing off the CHG Soap.  9. Pat yourself dry with a CLEAN TOWEL.  10. Wear CLEAN PAJAMAS to bed the night before surgery, wear comfortable clothes the morning of surgery  11. Place CLEAN SHEETS on your bed the night of your first shower and DO NOT SLEEP WITH PETS.  Day of Surgery: Do not apply any deodorants/lotions. Please wear clean clothes to the hospital/surgery center.    Please  read over the following fact sheets that you were given. Pain Booklet, Coughing and Deep Breathing and Surgical Site Infection Prevention

## 2017-07-14 ENCOUNTER — Encounter (HOSPITAL_COMMUNITY)
Admission: RE | Admit: 2017-07-14 | Discharge: 2017-07-14 | Disposition: A | Discharge status: Home / Self Care | Payer: Medicare PPO | Source: Ambulatory Visit | Attending: Surgery | Admitting: Surgery

## 2017-07-14 ENCOUNTER — Encounter (HOSPITAL_COMMUNITY): Payer: Self-pay

## 2017-07-14 ENCOUNTER — Other Ambulatory Visit: Payer: Self-pay

## 2017-07-14 DIAGNOSIS — Z87891 Personal history of nicotine dependence: Secondary | ICD-10-CM | POA: Diagnosis not present

## 2017-07-14 DIAGNOSIS — Z853 Personal history of malignant neoplasm of breast: Secondary | ICD-10-CM | POA: Insufficient documentation

## 2017-07-14 DIAGNOSIS — I25810 Atherosclerosis of coronary artery bypass graft(s) without angina pectoris: Secondary | ICD-10-CM | POA: Diagnosis not present

## 2017-07-14 DIAGNOSIS — G4733 Obstructive sleep apnea (adult) (pediatric): Secondary | ICD-10-CM | POA: Insufficient documentation

## 2017-07-14 DIAGNOSIS — E785 Hyperlipidemia, unspecified: Secondary | ICD-10-CM | POA: Diagnosis not present

## 2017-07-14 DIAGNOSIS — Z6837 Body mass index (BMI) 37.0-37.9, adult: Secondary | ICD-10-CM | POA: Insufficient documentation

## 2017-07-14 DIAGNOSIS — Z01818 Encounter for other preprocedural examination: Secondary | ICD-10-CM | POA: Diagnosis present

## 2017-07-14 DIAGNOSIS — Z8673 Personal history of transient ischemic attack (TIA), and cerebral infarction without residual deficits: Secondary | ICD-10-CM | POA: Diagnosis not present

## 2017-07-14 DIAGNOSIS — Z8614 Personal history of Methicillin resistant Staphylococcus aureus infection: Secondary | ICD-10-CM | POA: Insufficient documentation

## 2017-07-14 DIAGNOSIS — Z951 Presence of aortocoronary bypass graft: Secondary | ICD-10-CM | POA: Insufficient documentation

## 2017-07-14 DIAGNOSIS — Z9889 Other specified postprocedural states: Secondary | ICD-10-CM | POA: Diagnosis not present

## 2017-07-14 DIAGNOSIS — Z90710 Acquired absence of both cervix and uterus: Secondary | ICD-10-CM | POA: Diagnosis not present

## 2017-07-14 DIAGNOSIS — K219 Gastro-esophageal reflux disease without esophagitis: Secondary | ICD-10-CM | POA: Diagnosis not present

## 2017-07-14 DIAGNOSIS — Z7984 Long term (current) use of oral hypoglycemic drugs: Secondary | ICD-10-CM | POA: Insufficient documentation

## 2017-07-14 DIAGNOSIS — E669 Obesity, unspecified: Secondary | ICD-10-CM | POA: Insufficient documentation

## 2017-07-14 DIAGNOSIS — Z79899 Other long term (current) drug therapy: Secondary | ICD-10-CM | POA: Diagnosis not present

## 2017-07-14 DIAGNOSIS — E119 Type 2 diabetes mellitus without complications: Secondary | ICD-10-CM | POA: Diagnosis not present

## 2017-07-14 DIAGNOSIS — Z7982 Long term (current) use of aspirin: Secondary | ICD-10-CM | POA: Diagnosis not present

## 2017-07-14 DIAGNOSIS — Z981 Arthrodesis status: Secondary | ICD-10-CM | POA: Diagnosis not present

## 2017-07-14 LAB — CBC
HCT: 34.6 % — ABNORMAL LOW (ref 36.0–46.0)
Hemoglobin: 10.9 g/dL — ABNORMAL LOW (ref 12.0–15.0)
MCH: 25.6 pg — AB (ref 26.0–34.0)
MCHC: 31.5 g/dL (ref 30.0–36.0)
MCV: 81.4 fL (ref 78.0–100.0)
PLATELETS: 297 10*3/uL (ref 150–400)
RBC: 4.25 MIL/uL (ref 3.87–5.11)
RDW: 15.1 % (ref 11.5–15.5)
WBC: 8.9 10*3/uL (ref 4.0–10.5)

## 2017-07-14 LAB — BASIC METABOLIC PANEL
Anion gap: 11 (ref 5–15)
BUN: 22 mg/dL — ABNORMAL HIGH (ref 6–20)
CALCIUM: 9.5 mg/dL (ref 8.9–10.3)
CO2: 23 mmol/L (ref 22–32)
CREATININE: 1.4 mg/dL — AB (ref 0.44–1.00)
Chloride: 105 mmol/L (ref 101–111)
GFR calc Af Amer: 44 mL/min — ABNORMAL LOW (ref >60)
GFR calc non Af Amer: 38 mL/min — ABNORMAL LOW (ref >60)
Glucose, Bld: 189 mg/dL — ABNORMAL HIGH (ref 65–99)
Potassium: 4.4 mmol/L (ref 3.5–5.1)
Sodium: 139 mmol/L (ref 135–145)

## 2017-07-14 LAB — HEMOGLOBIN A1C
HEMOGLOBIN A1C: 8.1 % — AB (ref 4.8–5.6)
Mean Plasma Glucose: 185.77 mg/dL

## 2017-07-14 LAB — GLUCOSE, CAPILLARY: GLUCOSE-CAPILLARY: 131 mg/dL — AB (ref 65–99)

## 2017-07-14 NOTE — Progress Notes (Signed)
PCP - Dr. Braulio Conte  Cardiologist - Dr. Tressia Miners Turner/ PA Silas Flood Rosita Fire- Clearance note in [place] 06/30/17- Per note, pt is to continue taking Aspirin. Confirmed with Abigail Butts from Dr. Pollie Friar office.  Chest x-ray - Denies  EKG - 06/30/17 (E)  Stress Test - 04/01/15 (E)  ECHO - 03/30/15 (E)  Cardiac Cath - 04/03/15 (E)  Sleep Study - Yes CPAP - No. Pt sts her insurance would not cover the cost of the machine  LABS- 07/14/17: CBC, BMP, HA1C  HA1C- 07/14/17 Fasting Blood Sugar - 125-140, Today 131 Checks Blood Sugar ____1_ times a day  Anesthesia- Yes. Cardiac history and clearance note in Epic.  Pt denies having chest pain, sob, or fever at this time. All instructions explained to the pt, with a verbal understanding of the material. Pt agrees to go over the instructions while at home for a better understanding. The opportunity to ask questions was provided.

## 2017-07-17 NOTE — Progress Notes (Signed)
Anesthesia Chart Review: Patient is a 67 year old female scheduled for right breast lumpectomy with radioactive seed and sentinel lymph node biopsy on 07/20/17 by Dr. Alphonsa Overall. It appears that seed implant is scheduled for 07/19/17.  History includes former smoker (quit '12), CAD with papillary fibroelastoma of MV s/p CABG (SVG-D2, SVG-PDA) with resection of fibroelastoma and MV repair 06/2010, HTN, GERD, depression, HLD, CVA with work-up revealing distal R MCA stenosis but with preserved distal flow and no major branch occlusion and MV fibroelastoma 11/2009 (s/p MV fibroelastoma resection 06/2010), right breast cancer  (invasive ductal carcinoma, grade II) 05/2017, OSA (no CPAP), DM2, anemia, MRSA, left L2-3 microdiskectomy 04/15/15, hysterectomy, tonsillectomy. BMI is consistent with obesity.   - PCP is Fredia Beets, NP. - HEM-ONC is Dr. Nicholas Lose. - Cardiologist is Dr. Fransico Him. Last visit 06/30/17 with Lyda Jester, PA-C for preoperative evaluation. She wrote, "She can be cleared for noncardiac surgery and is of acceptable risk, <1% risk for major cardiac events based on the RCRI calculator. We recommend continuation of ASA, statin and beta blocker during the perioperative period." One year cardiology follow-up recommended.  Meds include ASA 81 mg (to continue perioperatively), albuterol HFA, amlodipine, Lipitor, Wellbutrin XL, Coreg, glipizide, Tradjenta, HCTZ, losartan, metformin, Protonix, Trazodone.   BP (!) 156/60   Pulse 74   Temp 36.9 C (Oral)   Resp 18   Ht 5' 7.75" (1.721 m)   Wt 247 lb 7 oz (112.2 kg)   SpO2 100%   BMI 37.90 kg/m   EKG 06/30/17: NSR, T wave abnormality (negative high lateral T waves), consider lateral ischemia. T wave abnormality also seen on 03/30/15 tracing.  Nuclear stress test 04/01/15:  There was no ST segment deviation noted during stress.  Findings consistent with ischemia.  This is an intermediate risk study.  The left ventricular  ejection fraction is normal (55-65%). 1. Medium-sized, reversible mid to apical inferior and inferolateral perfusion defect. This is suggestive of ischemia. 2. Normal EF with mild inferior hypokinesis.   Subsequently, patient had LHC 04/03/15 that showed:   Mid RCA lesion, 95% stenosed.  Mid RCA to Dist RCA lesion, 100% stenosed.  Ost Ramus to Ramus lesion, 95% stenosed. This is a tiny branch.  2nd Diag lesion, 100% stenosed.  Prox Cx to Mid Cx lesion, 30% stenosed.  SVG to PDA was injected is normal in caliber, and is anatomically normal.  SVG to second diagonal was injected is normal in caliber, and is anatomically normal.  The left ventricular systolic function is normal. 1. Severe 2 vessel obstructive CAD. The ramus branch is tiny.  2. Patent SVG to PDA 3. Patent SVG to second diagonal 4. Normal LV function 5. No MR Plan: continue medical therapy.   Echo 03/30/15: Study Conclusions - Left ventricle: The cavity size was normal. There was severe concentric hypertrophy. Systolic function was normal. Theestimated ejection fraction was in the range of 60% to 65%. Wall motion was normal; there were no regional wall motionabnormalities. Doppler parameters are consistent with abnormal left ventricular relaxation (grade 1 diastolic dysfunction). Doppler parameters are consistent with intermediate ventricular end-diastolic filling pressure. - Aortic valve: Trileaflet; normal thickness leaflets. Mobility was not restricted.  Doppler:   There was mild stenosis.   There was no regurgitation.    VTI ratio of LVOT to aortic valve: 0.83. Valve area (VTI): 2.35 cm^2. Indexed valve area (VTI): 1.03 cm^2/m^2. Peak velocity ratio of LVOT to aortic valve: 0.81. Valve area (Vmax): 2.31 cm^2. Indexed valve area (  Vmax): 1.01 cm^2/m^2. Mean velocity ratio of LVOT to aortic valve: 0.72. Valve area (Vmean): 2.05 cm^2. Indexed valve area (Vmean): 0.9 cm^2/m^2. - Aortic root: The aortic root was normal  in size. - Mitral valve: S/P mitral annulus repair. Mildly thickened leaflets. Mean gradient (D): 5 mm Hg. Valve area by continuity equation (using LVOT flow): 3.04 cm^2. - Right ventricle: The cavity size was normal. Wall thickness was normal. Systolic function was normal. - Right atrium: The atrium was normal in size.  - Tricuspid valve: There was mild regurgitation. - Pulmonic valve: There was no regurgitation. - Pulmonary arteries: Systolic pressure was within the normalrange. - Inferior vena cava: The vessel was normal in size. - Pericardium, extracardiac: There was no pericardial effusion. Impressions: There is severe LVH with maximum LVOT gradient 16 mmHg. Gradients across the mitral valve are mildly elevated.  Preoperative labs noted. Cr 1.40, up from 1.17 on 09/26/16. Glucose 189. A1c 8.1. H/H 10.9/34.6. PLT 297.  She had recent cardiology follow-up and cleared for surgery. If no acute changes then I would anticipate that she can proceed as planned.     George [facility] Short Stay Center/Anesthesiology Phone [phone] 07/17/2017 10:47 PM

## 2017-07-19 ENCOUNTER — Ambulatory Visit
Admission: RE | Admit: 2017-07-19 | Discharge: 2017-07-19 | Disposition: A | Discharge status: Home / Self Care | Payer: Medicare PPO | Source: Ambulatory Visit | Attending: Surgery | Admitting: Surgery

## 2017-07-19 ENCOUNTER — Other Ambulatory Visit: Payer: Self-pay | Admitting: Surgery

## 2017-07-19 DIAGNOSIS — Z170 Estrogen receptor positive status [ER+]: Principal | ICD-10-CM

## 2017-07-19 DIAGNOSIS — C50911 Malignant neoplasm of unspecified site of right female breast: Secondary | ICD-10-CM
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
## 2017-07-20 ENCOUNTER — Ambulatory Visit (HOSPITAL_COMMUNITY): Payer: Medicare PPO | Admitting: Vascular Surgery

## 2017-07-20 ENCOUNTER — Encounter (HOSPITAL_COMMUNITY)
Admission: RE | Disposition: A | Discharge status: Home / Self Care | Payer: Self-pay | Source: Ambulatory Visit | Attending: Surgery

## 2017-07-20 ENCOUNTER — Ambulatory Visit (HOSPITAL_COMMUNITY)
Admission: RE | Admit: 2017-07-20 | Discharge: 2017-07-20 | Disposition: A | Discharge status: Home / Self Care | Payer: Medicare PPO | Source: Ambulatory Visit | Attending: Surgery | Admitting: Surgery

## 2017-07-20 ENCOUNTER — Ambulatory Visit (HOSPITAL_COMMUNITY): Payer: Medicare PPO | Admitting: Anesthesiology

## 2017-07-20 ENCOUNTER — Ambulatory Visit
Admission: RE | Admit: 2017-07-20 | Discharge: 2017-07-20 | Disposition: A | Discharge status: Home / Self Care | Payer: Medicare PPO | Source: Ambulatory Visit | Attending: Surgery | Admitting: Surgery

## 2017-07-20 ENCOUNTER — Encounter (HOSPITAL_COMMUNITY): Payer: Self-pay | Admitting: *Deleted

## 2017-07-20 DIAGNOSIS — J449 Chronic obstructive pulmonary disease, unspecified: Secondary | ICD-10-CM | POA: Insufficient documentation

## 2017-07-20 DIAGNOSIS — E7800 Pure hypercholesterolemia, unspecified: Secondary | ICD-10-CM | POA: Insufficient documentation

## 2017-07-20 DIAGNOSIS — I2510 Atherosclerotic heart disease of native coronary artery without angina pectoris: Secondary | ICD-10-CM | POA: Insufficient documentation

## 2017-07-20 DIAGNOSIS — Z952 Presence of prosthetic heart valve: Secondary | ICD-10-CM | POA: Diagnosis not present

## 2017-07-20 DIAGNOSIS — Z87891 Personal history of nicotine dependence: Secondary | ICD-10-CM | POA: Diagnosis not present

## 2017-07-20 DIAGNOSIS — F329 Major depressive disorder, single episode, unspecified: Secondary | ICD-10-CM | POA: Insufficient documentation

## 2017-07-20 DIAGNOSIS — Z8673 Personal history of transient ischemic attack (TIA), and cerebral infarction without residual deficits: Secondary | ICD-10-CM | POA: Insufficient documentation

## 2017-07-20 DIAGNOSIS — E119 Type 2 diabetes mellitus without complications: Secondary | ICD-10-CM | POA: Insufficient documentation

## 2017-07-20 DIAGNOSIS — C50911 Malignant neoplasm of unspecified site of right female breast: Secondary | ICD-10-CM

## 2017-07-20 DIAGNOSIS — Z951 Presence of aortocoronary bypass graft: Secondary | ICD-10-CM | POA: Diagnosis not present

## 2017-07-20 DIAGNOSIS — G40909 Epilepsy, unspecified, not intractable, without status epilepticus: Secondary | ICD-10-CM | POA: Insufficient documentation

## 2017-07-20 DIAGNOSIS — Z7984 Long term (current) use of oral hypoglycemic drugs: Secondary | ICD-10-CM | POA: Diagnosis not present

## 2017-07-20 DIAGNOSIS — I10 Essential (primary) hypertension: Secondary | ICD-10-CM | POA: Diagnosis not present

## 2017-07-20 DIAGNOSIS — C50411 Malignant neoplasm of upper-outer quadrant of right female breast: Secondary | ICD-10-CM | POA: Diagnosis not present

## 2017-07-20 DIAGNOSIS — Z79899 Other long term (current) drug therapy: Secondary | ICD-10-CM | POA: Diagnosis not present

## 2017-07-20 DIAGNOSIS — K219 Gastro-esophageal reflux disease without esophagitis: Secondary | ICD-10-CM | POA: Insufficient documentation

## 2017-07-20 DIAGNOSIS — Z170 Estrogen receptor positive status [ER+]: Principal | ICD-10-CM

## 2017-07-20 HISTORY — PX: BREAST LUMPECTOMY WITH RADIOACTIVE SEED AND SENTINEL LYMPH NODE BIOPSY: SHX6550

## 2017-07-20 LAB — GLUCOSE, CAPILLARY
GLUCOSE-CAPILLARY: 114 mg/dL — AB (ref 65–99)
Glucose-Capillary: 150 mg/dL — ABNORMAL HIGH (ref 65–99)

## 2017-07-20 LAB — SURGICAL PCR SCREEN
MRSA, PCR: NEGATIVE
STAPHYLOCOCCUS AUREUS: NEGATIVE

## 2017-07-20 SURGERY — BREAST LUMPECTOMY WITH RADIOACTIVE SEED AND SENTINEL LYMPH NODE BIOPSY
Anesthesia: General | Site: Breast | Laterality: Right

## 2017-07-20 MED ORDER — DEXAMETHASONE SODIUM PHOSPHATE 10 MG/ML IJ SOLN
INTRAMUSCULAR | Status: AC
  Filled 2017-07-20: qty 1

## 2017-07-20 MED ORDER — TECHNETIUM TC 99M SULFUR COLLOID FILTERED
1.0000 | Freq: Once | INTRAVENOUS | Status: AC | PRN
  Administered 2017-07-20: 1 via INTRADERMAL

## 2017-07-20 MED ORDER — MIDAZOLAM HCL 5 MG/5ML IJ SOLN
INTRAMUSCULAR | Status: DC | PRN
  Administered 2017-07-20: 2 mg via INTRAVENOUS

## 2017-07-20 MED ORDER — ROCURONIUM BROMIDE 10 MG/ML (PF) SYRINGE
PREFILLED_SYRINGE | INTRAVENOUS | Status: AC
  Filled 2017-07-20: qty 5

## 2017-07-20 MED ORDER — METHYLENE BLUE 0.5 % INJ SOLN
INTRAVENOUS | Status: AC
  Filled 2017-07-20: qty 10

## 2017-07-20 MED ORDER — PROPOFOL 10 MG/ML IV BOLUS
INTRAVENOUS | Status: DC | PRN
  Administered 2017-07-20: 150 mg via INTRAVENOUS

## 2017-07-20 MED ORDER — ONDANSETRON HCL 4 MG/2ML IJ SOLN: 4.0000 mg | Freq: Once | INTRAMUSCULAR | Status: DC | PRN

## 2017-07-20 MED ORDER — SODIUM CHLORIDE 0.9 % IJ SOLN
INTRAVENOUS | Status: DC | PRN
  Administered 2017-07-20: 2 mL

## 2017-07-20 MED ORDER — DEXAMETHASONE SODIUM PHOSPHATE 10 MG/ML IJ SOLN
INTRAMUSCULAR | Status: DC | PRN
  Administered 2017-07-20: 5 mg via INTRAVENOUS

## 2017-07-20 MED ORDER — CHLORHEXIDINE GLUCONATE CLOTH 2 % EX PADS: 6.0000 | MEDICATED_PAD | Freq: Once | CUTANEOUS | Status: DC

## 2017-07-20 MED ORDER — SUGAMMADEX SODIUM 200 MG/2ML IV SOLN
INTRAVENOUS | Status: AC
  Filled 2017-07-20: qty 2

## 2017-07-20 MED ORDER — LIDOCAINE 2% (20 MG/ML) 5 ML SYRINGE
INTRAMUSCULAR | Status: AC
  Filled 2017-07-20: qty 5

## 2017-07-20 MED ORDER — 0.9 % SODIUM CHLORIDE (POUR BTL) OPTIME
TOPICAL | Status: DC | PRN
  Administered 2017-07-20: 1000 mL

## 2017-07-20 MED ORDER — BUPIVACAINE-EPINEPHRINE (PF) 0.5% -1:200000 IJ SOLN
INTRAMUSCULAR | Status: AC
  Filled 2017-07-20: qty 30

## 2017-07-20 MED ORDER — PHENYLEPHRINE HCL 10 MG/ML IJ SOLN
INTRAVENOUS | Status: DC | PRN
  Administered 2017-07-20: 15 ug/min via INTRAVENOUS

## 2017-07-20 MED ORDER — MIDAZOLAM HCL 2 MG/2ML IJ SOLN
INTRAMUSCULAR | Status: AC
  Filled 2017-07-20: qty 2

## 2017-07-20 MED ORDER — GABAPENTIN 300 MG PO CAPS
300.0000 mg | ORAL_CAPSULE | ORAL | Status: AC
  Administered 2017-07-20: 300 mg via ORAL
  Filled 2017-07-20: qty 1

## 2017-07-20 MED ORDER — LACTATED RINGERS IV SOLN
INTRAVENOUS | Status: DC | PRN
  Administered 2017-07-20: 07:00:00 via INTRAVENOUS

## 2017-07-20 MED ORDER — BUPIVACAINE-EPINEPHRINE 0.5% -1:200000 IJ SOLN
INTRAMUSCULAR | Status: DC | PRN
  Administered 2017-07-20: 20 mL

## 2017-07-20 MED ORDER — FENTANYL CITRATE (PF) 100 MCG/2ML IJ SOLN: 25.0000 ug | INTRAMUSCULAR | Status: DC | PRN

## 2017-07-20 MED ORDER — CEFAZOLIN SODIUM-DEXTROSE 2-4 GM/100ML-% IV SOLN
2.0000 g | INTRAVENOUS | Status: AC
  Administered 2017-07-20: 2 g via INTRAVENOUS
  Filled 2017-07-20: qty 100

## 2017-07-20 MED ORDER — OXYCODONE HCL 5 MG PO TABS: 5.0000 mg | ORAL_TABLET | Freq: Four times a day (QID) | ORAL | 0 refills | Status: DC | PRN

## 2017-07-20 MED ORDER — ROPIVACAINE HCL 7.5 MG/ML IJ SOLN
INTRAMUSCULAR | Status: DC | PRN
  Administered 2017-07-20: 20 mL via PERINEURAL

## 2017-07-20 MED ORDER — SUGAMMADEX SODIUM 200 MG/2ML IV SOLN
INTRAVENOUS | Status: DC | PRN
  Administered 2017-07-20: 200 mg via INTRAVENOUS

## 2017-07-20 MED ORDER — SODIUM CHLORIDE 0.9 % IJ SOLN
INTRAMUSCULAR | Status: AC
  Filled 2017-07-20: qty 10

## 2017-07-20 MED ORDER — ACETAMINOPHEN 500 MG PO TABS
1000.0000 mg | ORAL_TABLET | ORAL | Status: AC
  Administered 2017-07-20: 1000 mg via ORAL
  Filled 2017-07-20: qty 2

## 2017-07-20 MED ORDER — ONDANSETRON HCL 4 MG/2ML IJ SOLN
INTRAMUSCULAR | Status: AC
  Filled 2017-07-20: qty 2

## 2017-07-20 MED ORDER — ONDANSETRON HCL 4 MG/2ML IJ SOLN
INTRAMUSCULAR | Status: DC | PRN
  Administered 2017-07-20: 4 mg via INTRAVENOUS

## 2017-07-20 MED ORDER — ROCURONIUM BROMIDE 100 MG/10ML IV SOLN
INTRAVENOUS | Status: DC | PRN
  Administered 2017-07-20: 30 mg via INTRAVENOUS

## 2017-07-20 MED ORDER — LIDOCAINE HCL (CARDIAC) 20 MG/ML IV SOLN
INTRAVENOUS | Status: DC | PRN
  Administered 2017-07-20: 50 mg via INTRAVENOUS

## 2017-07-20 MED ORDER — FENTANYL CITRATE (PF) 250 MCG/5ML IJ SOLN
INTRAMUSCULAR | Status: AC
  Filled 2017-07-20: qty 5

## 2017-07-20 MED ORDER — PROPOFOL 10 MG/ML IV BOLUS
INTRAVENOUS | Status: AC
  Filled 2017-07-20: qty 20

## 2017-07-20 MED ORDER — FENTANYL CITRATE (PF) 100 MCG/2ML IJ SOLN
INTRAMUSCULAR | Status: DC | PRN
  Administered 2017-07-20: 100 ug via INTRAVENOUS
  Administered 2017-07-20: 50 ug via INTRAVENOUS

## 2017-07-20 SURGICAL SUPPLY — 52 items
ADH SKN CLS APL DERMABOND .7 (GAUZE/BANDAGES/DRESSINGS) ×1
BINDER BREAST LRG (GAUZE/BANDAGES/DRESSINGS) IMPLANT
BINDER BREAST XLRG (GAUZE/BANDAGES/DRESSINGS) IMPLANT
BINDER BREAST XXLRG (GAUZE/BANDAGES/DRESSINGS) ×1 IMPLANT
BIOPATCH RED 1 DISK 7.0 (GAUZE/BANDAGES/DRESSINGS) ×1 IMPLANT
BLADE SURG 15 STRL LF DISP TIS (BLADE) ×2 IMPLANT
BLADE SURG 15 STRL SS (BLADE) ×4
CANISTER SUCT 3000ML PPV (MISCELLANEOUS) ×2 IMPLANT
CHLORAPREP W/TINT 26ML (MISCELLANEOUS) ×2 IMPLANT
COVER PROBE W GEL 5X96 (DRAPES) ×2 IMPLANT
COVER SURGICAL LIGHT HANDLE (MISCELLANEOUS) ×2 IMPLANT
DERMABOND ADVANCED (GAUZE/BANDAGES/DRESSINGS) ×1
DERMABOND ADVANCED .7 DNX12 (GAUZE/BANDAGES/DRESSINGS) ×1 IMPLANT
DEVICE DUBIN SPECIMEN MAMMOGRA (MISCELLANEOUS) ×2 IMPLANT
DRAIN CHANNEL 19F RND (DRAIN) ×1 IMPLANT
DRAPE CHEST BREAST 15X10 FENES (DRAPES) ×2 IMPLANT
DRAPE UTILITY XL STRL (DRAPES) ×2 IMPLANT
ELECT COATED BLADE 2.86 ST (ELECTRODE) ×2 IMPLANT
ELECT REM PT RETURN 9FT ADLT (ELECTROSURGICAL) ×2
ELECTRODE REM PT RTRN 9FT ADLT (ELECTROSURGICAL) ×1 IMPLANT
EVACUATOR SILICONE 100CC (DRAIN) ×1 IMPLANT
GAUZE SPONGE 4X4 12PLY STRL (GAUZE/BANDAGES/DRESSINGS) ×2 IMPLANT
GAUZE SPONGE 4X4 12PLY STRL LF (GAUZE/BANDAGES/DRESSINGS) ×1 IMPLANT
GLOVE SURG SIGNA 7.5 PF LTX (GLOVE) ×4 IMPLANT
GOWN STRL REUS W/ TWL LRG LVL3 (GOWN DISPOSABLE) ×1 IMPLANT
GOWN STRL REUS W/ TWL XL LVL3 (GOWN DISPOSABLE) ×1 IMPLANT
GOWN STRL REUS W/TWL LRG LVL3 (GOWN DISPOSABLE) ×2
GOWN STRL REUS W/TWL XL LVL3 (GOWN DISPOSABLE) ×2
ILLUMINATOR WAVEGUIDE N/F (MISCELLANEOUS) IMPLANT
KIT BASIN OR (CUSTOM PROCEDURE TRAY) ×2 IMPLANT
KIT MARKER MARGIN INK (KITS) ×2 IMPLANT
LIGHT WAVEGUIDE WIDE FLAT (MISCELLANEOUS) IMPLANT
NDL FILTER BLUNT 18X1 1/2 (NEEDLE) IMPLANT
NDL SAFETY ECLIPSE 18X1.5 (NEEDLE) IMPLANT
NEEDLE FILTER BLUNT 18X 1/2SAF (NEEDLE)
NEEDLE FILTER BLUNT 18X1 1/2 (NEEDLE) IMPLANT
NEEDLE HYPO 18GX1.5 SHARP (NEEDLE)
NEEDLE HYPO 25GX1X1/2 BEV (NEEDLE) ×2 IMPLANT
NS IRRIG 1000ML POUR BTL (IV SOLUTION) ×2 IMPLANT
PACK SURGICAL SETUP 50X90 (CUSTOM PROCEDURE TRAY) ×2 IMPLANT
PAD ABD 8X10 STRL (GAUZE/BANDAGES/DRESSINGS) ×2 IMPLANT
PENCIL BUTTON HOLSTER BLD 10FT (ELECTRODE) ×2 IMPLANT
SPONGE LAP 18X18 X RAY DECT (DISPOSABLE) ×2 IMPLANT
SUT ETHILON 2 0 FS 18 (SUTURE) ×1 IMPLANT
SUT MNCRL AB 4-0 PS2 18 (SUTURE) ×2 IMPLANT
SUT VIC AB 3-0 SH 8-18 (SUTURE) ×3 IMPLANT
SYR BULB 3OZ (MISCELLANEOUS) ×2 IMPLANT
SYR CONTROL 10ML LL (SYRINGE) ×2 IMPLANT
TOWEL OR 17X24 6PK STRL BLUE (TOWEL DISPOSABLE) ×2 IMPLANT
TOWEL OR 17X26 10 PK STRL BLUE (TOWEL DISPOSABLE) ×2 IMPLANT
TUBE CONNECTING 12X1/4 (SUCTIONS) ×2 IMPLANT
YANKAUER SUCT BULB TIP NO VENT (SUCTIONS) ×2 IMPLANT

## 2017-07-20 NOTE — Anesthesia Procedure Notes (Signed)
Anesthesia Regional Block: Pectoralis block   Pre-Anesthetic Checklist: ,, timeout performed, Correct Patient, Correct Site, Correct Laterality, Correct Procedure,, site marked, risks and benefits discussed, Surgical consent,  Pre-op evaluation,  At surgeon's request and post-op pain management  Laterality: Right  Prep: chloraprep       Needles:  Injection technique: Single-shot  Needle Type: Echogenic Stimulator Needle     Needle Length: 9cm  Needle Gauge: 21     Additional Needles:   Procedures:,,,, ultrasound used (permanent image in chart),,,,  Narrative:  Start time: 07/20/2017 7:05 AM End time: 07/20/2017 7:15 AM Injection made incrementally with aspirations every 5 mL.  Performed by: Personally  Anesthesiologist: Murvin Natal, MD  Additional Notes: Functioning IV was confirmed and monitors were applied.  A 8mm 21ga Arrow echogenic stimulator needle was used. Sterile prep,hand hygiene and sterile gloves were used.  Negative aspiration and negative test dose prior to incremental administration of local anesthetic. The patient tolerated the procedure well.

## 2017-07-20 NOTE — Transfer of Care (Signed)
Immediate Anesthesia Transfer of Care Note  Patient: [name]  Procedure(s) Performed: RIGHT BREAST LUMPECTOMY WITH RADIOACTIVE SEED AND SENTINEL LYMPH NODE BIOPSY (Right Breast)  Patient Location: PACU  Anesthesia Type:GA combined with regional for post-op pain  Level of Consciousness: awake, alert  and oriented  Airway & Oxygen Therapy: Patient Spontanous Breathing and Patient connected to nasal cannula oxygen  Post-op Assessment: Report given to RN, Post -op Vital signs reviewed and stable and Patient moving all extremities X 4  Post vital signs: Reviewed and stable  Last Vitals:  Vitals:   07/20/17 0540 07/20/17 0915  BP: (!) 138/56 138/74  Pulse: 78   Resp: 18   Temp: 36.8 C 36.5 C  SpO2: 98%     Last Pain:  Vitals:   07/20/17 0625  TempSrc:   PainSc: 4          Complications: No apparent anesthesia complications

## 2017-07-20 NOTE — Anesthesia Procedure Notes (Signed)
Procedure Name: Intubation Date/Time: 07/20/2017 7:37 AM Performed by: Kyung Rudd, CRNA Pre-anesthesia Checklist: Patient identified, Emergency Drugs available, Suction available and Patient being monitored Patient Re-evaluated:Patient Re-evaluated prior to induction Oxygen Delivery Method: Circle system utilized Preoxygenation: Pre-oxygenation with 100% oxygen Induction Type: IV induction Ventilation: Mask ventilation without difficulty Laryngoscope Size: Mac and 4 Grade View: Grade II Tube type: Oral Tube size: 7.0 mm Number of attempts: 1 Airway Equipment and Method: Stylet Placement Confirmation: ETT inserted through vocal cords under direct vision,  positive ETCO2 and breath sounds checked- equal and bilateral Secured at: 21 cm Tube secured with: Tape Dental Injury: Teeth and Oropharynx as per pre-operative assessment

## 2017-07-20 NOTE — Interval H&P Note (Signed)
History and Physical Interval Note:  07/20/2017 7:22 AM  [name]  has presented today for surgery, with the diagnosis of RIGHT BREAST CANCER  The various methods of treatment have been discussed with the patient and family.   Her husband is here with her.  After consideration of risks, benefits and other options for treatment, the patient has consented to  Procedure(s): BREAST LUMPECTOMY WITH RADIOACTIVE SEED AND SENTINEL LYMPH NODE BIOPSY (Right) as a surgical intervention .  The patient's history has been reviewed, patient examined, no change in status, stable for surgery.  I have reviewed the patient's chart and labs.  Questions were answered to the patient's satisfaction.     Shann Medal

## 2017-07-20 NOTE — Anesthesia Postprocedure Evaluation (Signed)
Anesthesia Post Note  Patient: [name]  Procedure(s) Performed: RIGHT BREAST LUMPECTOMY WITH RADIOACTIVE SEED AND SENTINEL LYMPH NODE BIOPSY (Right Breast)     Patient location during evaluation: PACU Anesthesia Type: General and Regional Level of consciousness: awake and alert Pain management: pain level controlled Vital Signs Assessment: post-procedure vital signs reviewed and stable Respiratory status: spontaneous breathing, nonlabored ventilation, respiratory function stable and patient connected to nasal cannula oxygen Cardiovascular status: blood pressure returned to baseline and stable Postop Assessment: no apparent nausea or vomiting Anesthetic complications: no    Last Vitals:  Vitals:   07/20/17 1030 07/20/17 1055  BP: 128/60 123/62  Pulse: 65 64  Resp: 15 16  Temp:    SpO2: 100% 100%    Last Pain:  Vitals:   07/20/17 1020  TempSrc:   PainSc: 0-No pain                 Ryan P Ellender

## 2017-07-20 NOTE — Discharge Instructions (Signed)
[facility] - DISCHARGE INSTRUCTIONS TO PATIENT  Activity:  Driving - May drive in 3 or 4 days, if doing well   Lifting - No lifting more than 15 pounds for one week, then no limit  Wound Care:   Leave bandage for 2 days.  Then you may remove the bandage and shower.        Empty the drain twice a day and record the amount of the drainage.  You can empty the drain more frequently if there is a lot of output in the drain.  Diet:  As tolerated.  Follow up appointment:  Call Dr. Pollie Friar office [facility]) at [phone] for an appointment in 1 week.  Medications and dosages:  Resume your home medications.  You have a prescription for:  Percocet  Call Dr. Lucia Gaskins or his office  [phone]) if you have:  Temperature greater than 100.4,  Persistent nausea and vomiting,  Severe uncontrolled pain,  Redness, tenderness, or signs of infection (pain, swelling, redness, odor or green/yellow discharge around the site),  Difficulty breathing, headache or visual disturbances,  Any other questions or concerns you may have after discharge.  In an emergency, call 911 or go to an Emergency Department at a nearby hospital.

## 2017-07-20 NOTE — Op Note (Addendum)
07/20/2017  9:03 AM  PATIENT:  [name] DOB: [birth year] MRN: [id number]  PREOP DIAGNOSIS:   RIGHT BREAST CANCER  POSTOP DIAGNOSIS:    Right breast cancer, lateral 10 o'clock position (T1, N0)  PROCEDURE:   Procedure(s):   RIGHT BREAST LUMPECTOMY WITH RADIOACTIVE SEED AND SENTINEL LYMPH NODE BIOPSY, Injection of peri areolar area of breast with methylene blue (2.0 cc), deep sentinel lymph node biopsy [Single incision]  SURGEON:   Alphonsa Overall, M.D.  ANESTHESIA:   general  Anesthesiologist: Murvin Natal, MD CRNA: Inda Coke, CRNA; Kyung Rudd, CRNA  General  EBL:  75  ml  DRAINS:  19 F Blake drain  LOCAL MEDICATIONS USED:   20 cc 1/2% marcaine, right pectoral block by anesthesia  SPECIMEN:   Right breast lumpectomy (6 color paint set), skin over cancer (short suture superior, long suture lateral), sentinel node (counts - 110, background 5, blue)  COUNTS CORRECT:  YES  INDICATIONS FOR PROCEDURE:  [name] is a 67 y.o. (DOB: April 18, 1951) AA female whose primary care physician is Alfonse Spruce, FNP and comes for right breast lumpectomy and right axillary sentinel lymph node biopsy.\   The patient has seen Drs. Remus Blake In consultation.  The options for breast cancer treatment have been discussed with the patient. She elected to proceed with lumpectomy and axillary sentinel lymph node.     The indications and potential complications of surgery were explained to the patient. Potential complications include, but are not limited to, bleeding, infection, the need for further surgery, and nerve injury.     She had a I131 seed placed on 07/19/2017 in her right breast at [facility].  The seed is in the later 10:30 o'clock position of the right breast (almost int he axilla).   In the holding area, her right areola was injected with 1 millicurie of Technitium Sulfur Colloid.  OPERATIVE NOTE:   The patient was taken to operating room # 1 at [facility] where she underwent a general anesthesia  supervised by Anesthesiologist: Murvin Natal, MD CRNA: Inda Coke, CRNA; Kyung Rudd, CRNA. Her right breast and axilla were prepped with  ChloraPrep and sterilely draped.    A time-out and the surgical check list was reviewed.    I injected about 2.0 mL of 40% methylene blue around her right areola.   I turned attention to the cancer which was about at the 10:30 o'clock position of the right breast (almost in the axilla).   I used the Neoprobe to identify the I131 seed.  I made an incision in the right axilla. I tried to excise an area around the tumor of at least 1 cm.    I excised this block of breast tissue approximately 4 cm by 5 cm  in diameter.   I took the dissection down to the chest wall.  I painted the lumpectomy specimen with the 6 color paint kit and did a specimen mammogram which confirmed the mass, clip, and the seed were all in the right position in the specimen.  The specimen was sent to pathology who called back to confirm that they have the seed and the specimen.  Because the cancer was so superficial, I excised the skin over the cancer and sent this as a separate specimen.   Through the same incision I dissected out the right deep axillary sentinel lymph node biopsy.  I found a hot area at the junction of  the breast and the pectoralis major muscle, deep in the axilla. I cut down and  identified a hot node that had counts of 110 and the background has 5 counts. The lymph node was blue. I checked her internal mammary nodes and supraclavicular nodes with the neoprobe and found no other hot area. The axillary node was then sent to pathology.    I then irrigated the wound with saline. I infiltrated approximately 20 mL of 1/2% Marcaine in the incisions. I placed 4 clips to mark biopsy cavity, at 12, 3, 6, and 9 o'clock.  Because I had a single incision, I placed a 44 French drain coming through a stab wound below the right breast.   The drain was sewn in place with a 2-0 nylon.   I then closed all the wounds in layers using 3-0 Vicryl sutures for the deep layer. At the skin, I closed the incisions with a 4-0 Monocryl suture. The incisions were then painted with Dermabond.  She had gauze place over the wounds and placed in a breast binder.   The patient tolerated the procedure well, was transported to the recovery room in good condition. Sponge and needle count were correct at the end of the case.   Final pathology is pending.   Alphonsa Overall, MD, [facility] Surgery Pager: [phone] Office phone:  [phone]

## 2017-07-20 NOTE — Anesthesia Preprocedure Evaluation (Addendum)
Anesthesia Evaluation  Patient identified by MRN, date of birth, ID band Patient awake    Reviewed: Allergy & Precautions, NPO status , Patient's Chart, lab work & pertinent test results, reviewed documented beta blocker date and time   Airway Mallampati: III  TM Distance: >3 FB Neck ROM: Full    Dental  (+) Teeth Intact, Dental Advisory Given, Chipped,    Pulmonary sleep apnea , COPD (controlled, mild),  COPD inhaler, former smoker,    breath sounds clear to auscultation       Cardiovascular hypertension, Pt. on medications and Pt. on home beta blockers + CAD, + CABG and + Peripheral Vascular Disease  + Valvular Problems/Murmurs (s/p mitral valve repair)  Rhythm:Regular Rate:Normal  ECG: NSR, rate 81  Cardiologist is Dr. Fransico Him. Last visit 06/30/17 with Lyda Jester, PA-C for preoperative evaluation.   Cath Mid RCA lesion, 95% stenosed. Mid RCA to Dist RCA lesion, 100% stenosed. Ost Ramus to Ramus lesion, 95% stenosed. This is a tiny branch. 2nd Diag lesion, 100% stenosed. Prox Cx to Mid Cx lesion, 30% stenosed. SVG to PDA was injected is normal in caliber, and is anatomically normal. SVG to second diagonal was injected is normal in caliber, and is anatomically normal. The left ventricular systolic function is normal.   1. Severe 2 vessel obstructive CAD. The ramus branch is tiny.  2. Patent SVG to PDA 3. Patent SVG to second diagonal 4. Normal LV function 5. No MR    Neuro/Psych PSYCHIATRIC DISORDERS Depression TIACVA, No Residual Symptoms    GI/Hepatic Neg liver ROS, GERD  Medicated and Controlled,Colon polyp   Endo/Other  diabetes, Type 2, Oral Hypoglycemic Agents  Renal/GU Renal disease     Musculoskeletal  (+) Arthritis ,   Abdominal (+) + obese,   Peds  Hematology  (+) anemia , HLD   Anesthesia Other Findings  RIGHT BREAST CANCER  Reproductive/Obstetrics                            Lab Results  Component Value Date   WBC 8.9 07/14/2017   HGB 10.9 (L) 07/14/2017   HCT 34.6 (L) 07/14/2017   MCV 81.4 07/14/2017   PLT 297 07/14/2017   Lab Results  Component Value Date   CREATININE 1.40 (H) 07/14/2017   BUN 22 (H) 07/14/2017   NA 139 07/14/2017   K 4.4 07/14/2017   CL 105 07/14/2017   CO2 23 07/14/2017    Anesthesia Physical  Anesthesia Plan  ASA: III  Anesthesia Plan: General   Post-op Pain Management: GA combined w/ Regional for post-op pain   Induction: Intravenous  PONV Risk Score and Plan: 3 and Midazolam, Dexamethasone, Ondansetron and Treatment may vary due to age or medical condition  Airway Management Planned: Oral ETT  Additional Equipment:   Intra-op Plan:   Post-operative Plan: Extubation in OR  Informed Consent: I have reviewed the patients History and Physical, chart, labs and discussed the procedure including the risks, benefits and alternatives for the proposed anesthesia with the patient or authorized representative who has indicated his/her understanding and acceptance.   Dental advisory given  Plan Discussed with: CRNA  Anesthesia Plan Comments:         Anesthesia Quick Evaluation

## 2017-07-21 ENCOUNTER — Encounter (HOSPITAL_COMMUNITY): Payer: Self-pay | Admitting: Surgery

## 2017-07-26 NOTE — Assessment & Plan Note (Signed)
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 I discussed the margins as well as lymph node surgeries. We also discussed the final staging along with previously performed ER/PR and HER-2/neu testing.  Plan: 1. Oncotype DX testing to determine if chemotherapy would be of any benefit followed by 2. Adjuvant radiation therapy followed by 3. Adjuvant antiestrogen therapy  RTC based on Oncotype result

## 2017-07-27 ENCOUNTER — Telehealth: Payer: Self-pay | Admitting: *Deleted

## 2017-07-27 ENCOUNTER — Inpatient Hospital Stay: Payer: Medicare PPO | Attending: Hematology and Oncology | Admitting: Hematology and Oncology

## 2017-07-27 DIAGNOSIS — C50411 Malignant neoplasm of upper-outer quadrant of right female breast: Secondary | ICD-10-CM | POA: Diagnosis not present

## 2017-07-27 DIAGNOSIS — Z170 Estrogen receptor positive status [ER+]: Secondary | ICD-10-CM | POA: Diagnosis not present

## 2017-07-27 NOTE — Progress Notes (Signed)
Patient Care Team: Alfonse Spruce, FNP as PCP - General (Family Medicine) Nicholas Lose, MD as Consulting Physician (Hematology and Oncology) Kyung Rudd, MD as Consulting Physician (Radiation Oncology)  DIAGNOSIS:  Encounter Diagnosis  Name Primary?  . Malignant neoplasm of upper-outer quadrant of right breast in female, estrogen receptor positive ([place])     SUMMARY OF ONCOLOGIC HISTORY:   Malignant neoplasm of upper-outer quadrant of right breast in female, estrogen receptor positive ([place])   06/12/2017 Initial Diagnosis    Right breast biopsy 10:00: IDC grade 2, ER 95%, PR 30%, Ki-67 15%, HER-2 negative ratio 1.36, 1.7 cm mass in the right upper outer quadrant, T1CN0 stage I a clinical stage      07/21/2017 Surgery    Rt Lumpectomy: Grade 2 IDC 1.7 cm, 0/2 LN Neg, Margins Neg, ER 95%, PR 30%, Ki-67 15%, HER-2 negative ratio 1.36 T1cN0 Stage 1A       CHIEF COMPLIANT: Follow-up after recent right lumpectomy  INTERVAL HISTORY: [name] is a 67 year old with above-mentioned history of right breast cancer underwent lumpectomy and is here today to discuss the pathology report.  She has a right-sided drain but otherwise she is doing quite well.  Pain is manageable.  REVIEW OF SYSTEMS:   Constitutional: Denies fevers, chills or abnormal weight loss Eyes: Denies blurriness of vision Ears, nose, mouth, throat, and face: Denies mucositis or sore throat Respiratory: Denies cough, dyspnea or wheezes Cardiovascular: Denies palpitation, chest discomfort Gastrointestinal:  Denies nausea, heartburn or change in bowel habits Skin: Denies abnormal skin rashes Lymphatics: Denies new lymphadenopathy or easy bruising Neurological:Denies numbness, tingling or new weaknesses Behavioral/Psych: Mood is stable, no new changes  Extremities: No lower extremity edema Breast: Right axillary drain, right lumpectomy All other systems were reviewed with the patient and are negative.  I  have reviewed the past medical history, past surgical history, social history and family history with the patient and they are unchanged from previous note.  ALLERGIES:  has No Known Allergies.  MEDICATIONS:  Current Outpatient Medications  Medication Sig Dispense Refill  . ACCU-CHEK SOFTCLIX LANCETS lancets Use as instructed 100 each 12  . albuterol (PROVENTIL HFA;VENTOLIN HFA) 108 (90 Base) MCG/ACT inhaler Inhale 2 puffs into the lungs every 6 (six) hours as needed for wheezing or shortness of breath. 1 Inhaler 2  . amLODipine (NORVASC) 10 MG tablet Take 1 tablet (10 mg total) by mouth daily. 30 tablet 0  . aspirin EC 81 MG tablet Take 1 tablet (81 mg total) by mouth daily. 30 tablet 5  . atorvastatin (LIPITOR) 40 MG tablet Take 1 tablet (40 mg total) by mouth daily. 30 tablet 0  . Blood Glucose Monitoring Suppl (ACCU-CHEK AVIVA PLUS) w/Device KIT Use as directed 1 kit 0  . buPROPion (WELLBUTRIN XL) 150 MG 24 hr tablet Take 1 tablet (150 mg total) by mouth daily. 30 tablet 0  . carvedilol (COREG) 6.25 MG tablet Take 1 tablet (6.25 mg total) by mouth 2 (two) times daily with a meal. 60 tablet 0  . celecoxib (CELEBREX) 200 MG capsule Take 1 capsule (200 mg total) by mouth 2 (two) times daily as needed. (Patient taking differently: Take 200 mg by mouth 2 (two) times daily as needed for mild pain. ) 60 capsule 3  . diclofenac sodium (VOLTAREN) 1 % GEL Apply 2 g topically 4 (four) times daily as needed. (Patient taking differently: Apply 2 g topically 4 (four) times daily as needed (pain). ) 1 Tube 1  .  docusate sodium (COLACE) 100 MG capsule Take 1 capsule (100 mg total) by mouth 2 (two) times daily. (Patient not taking: Reported on 07/11/2017) 60 capsule 0  . glipiZIDE (GLUCOTROL) 5 MG tablet Take 1 tablet (5 mg total) by mouth daily before breakfast. 30 tablet 0  . glucose blood (ACCU-CHEK AVIVA PLUS) test strip Use as instructed 100 each 12  . hydrochlorothiazide (HYDRODIURIL) 25 MG tablet Take 1  tablet (25 mg total) by mouth daily. 30 tablet 0  . ibuprofen (ADVIL,MOTRIN) 800 MG tablet TAKE 1 TABLET BY MOUTH EVERY 8 HOURS AS NEEDED FOR MODERATE PAIN OR CRAMPING. TAKE WITH FOOD. (Patient taking differently: Take 800 mg by mouth every 8 (eight) hours as needed. FOR MODERATE PAIN OR CRAMPING. TAKE WITH FOOD.) 30 tablet 0  . linagliptin (TRADJENTA) 5 MG TABS tablet Take 5 mg by mouth daily.    [name] losartan (COZAAR) 100 MG tablet Take 1 tablet (100 mg total) by mouth daily. 90 tablet 1  . metFORMIN (GLUCOPHAGE) 1000 MG tablet Take 1 tablet (1,000 mg total) by mouth 2 (two) times daily with a meal. 60 tablet 0  . Multiple Vitamins-Minerals (CENTRUM SILVER 50+WOMEN) TABS Take 1 tablet by mouth daily.    [name] oxyCODONE (OXY IR/ROXICODONE) 5 MG immediate release tablet Take 1 tablet (5 mg total) by mouth every 6 (six) hours as needed for severe pain. 20 tablet 0  . pantoprazole (PROTONIX) 40 MG tablet Take 1 tablet (40 mg total) by mouth daily. 30 tablet 0  . polyethylene glycol (MIRALAX / GLYCOLAX) packet Take 17 g by mouth daily as needed for mild constipation or moderate constipation. 30 each 2  . traZODone (DESYREL) 50 MG tablet Take 1 tablet (50 mg total) by mouth at bedtime as needed for sleep. (Patient taking differently: Take 50 mg by mouth at bedtime. ) 30 tablet 0   No current facility-administered medications for this visit.     PHYSICAL EXAMINATION: ECOG PERFORMANCE STATUS: 1 - Symptomatic but completely ambulatory  Vitals:   07/27/17 1202  BP: (!) 143/66  Pulse: 88  Resp: 20  Temp: 98.5 F (36.9 C)  SpO2: 100%   Filed Weights   07/27/17 1202  Weight: 245 lb (111.1 kg)    GENERAL:alert, no distress and comfortable SKIN: skin color, texture, turgor are normal, no rashes or significant lesions EYES: normal, Conjunctiva are pink and non-injected, sclera clear OROPHARYNX:no exudate, no erythema and lips, buccal mucosa, and tongue normal  NECK: supple, thyroid normal size,  non-tender, without nodularity LYMPH:  no palpable lymphadenopathy in the cervical, axillary or inguinal LUNGS: clear to auscultation and percussion with normal breathing effort HEART: regular rate & rhythm and no murmurs and no lower extremity edema ABDOMEN:abdomen soft, non-tender and normal bowel sounds MUSCULOSKELETAL:no cyanosis of digits and no clubbing  NEURO: alert & oriented x 3 with fluent speech, no focal motor/sensory deficits EXTREMITIES: No lower extremity edema  LABORATORY DATA:  I have reviewed the data as listed CMP Latest Ref Rng & Units 07/14/2017 09/26/2016 04/28/2016  Glucose 65 - 99 mg/dL 189(H) 196(H) 157(H)  BUN 6 - 20 mg/dL 22(H) 26 18  Creatinine 0.44 - 1.00 mg/dL 1.40(H) 1.17(H) 1.17(H)  Sodium 135 - 145 mmol/L 139 139 138  Potassium 3.5 - 5.1 mmol/L 4.4 4.4 4.1  Chloride 101 - 111 mmol/L 105 101 102  CO2 22 - 32 mmol/L _0 Calcium 8.9 - 10.3 mg/dL 9.5 9.1 9.5  Total Protein 6.0 - 8.5 g/dL - 6.8 7.0  Total Bilirubin 0.0 - 1.2 mg/dL - 0.3 0.6  Alkaline Phos 39 - 117 IU/L - 66 62  AST 0 - 40 IU/L - 19 15  ALT 0 - 32 IU/L - 11 9    Lab Results  Component Value Date   WBC 8.9 07/14/2017   HGB 10.9 (L) 07/14/2017   HCT 34.6 (L) 07/14/2017   MCV 81.4 07/14/2017   PLT 297 07/14/2017   NEUTROABS 4,697 04/28/2016    ASSESSMENT & PLAN:  Malignant neoplasm of upper-outer quadrant of right breast in female, estrogen receptor positive ([place]) 07/21/17: Rt Lumpectomy: Grade 2 IDC 1.7 cm, 0/2 LN Neg, Margins Neg, ER 95%, PR 30%, Ki-67 15%, HER-2 negative ratio 1.36 T1cN0 Stage 1A  Pathology counseling: I discussed the final pathology report of the patient provided  a copy of this report. I discussed the margins as well as lymph node surgeries. We also discussed the final staging along with previously performed ER/PR and HER-2/neu testing.  Plan: 1. Oncotype DX testing to determine if chemotherapy would be of any benefit followed by 2. Adjuvant radiation therapy  followed by 3. Adjuvant antiestrogen therapy  RTC based on Oncotype result    I spent 25 minutes talking to the patient of which more than half was spent in counseling and coordination of care.  No orders of the defined types were placed in this encounter.  The patient has a good understanding of the overall plan. she agrees with it. she will call with any problems that may develop before the next visit here.   Harriette Ohara, MD 07/27/17

## 2017-07-27 NOTE — Telephone Encounter (Signed)
Received order for oncotype testing. Requisition sent to pathology. Received by Keisha 

## 2017-08-07 MED FILL — CELECOXIB 200 MG CAPSULE: 200 | 30 days supply | Qty: 60 | Fill #1

## 2017-08-09 ENCOUNTER — Encounter (HOSPITAL_COMMUNITY): Payer: Self-pay

## 2017-08-09 ENCOUNTER — Telehealth: Payer: Self-pay | Admitting: *Deleted

## 2017-08-09 DIAGNOSIS — Z170 Estrogen receptor positive status [ER+]: Principal | ICD-10-CM

## 2017-08-09 DIAGNOSIS — C50411 Malignant neoplasm of upper-outer quadrant of right female breast: Secondary | ICD-10-CM

## 2017-08-09 NOTE — Telephone Encounter (Signed)
Received oncotype score of 14/4%. Physician team notified. Called pt with results. Discussed she does not need chemotherapy and her next step will be xrt with Dr. Lisbeth Renshaw. Denies questions or needs. Encourage pt to call with concerns. Received verbal understanding. Referral placed for Dr. Lisbeth Renshaw

## 2017-08-10 ENCOUNTER — Encounter: Payer: Self-pay | Admitting: Radiation Oncology

## 2017-08-14 NOTE — Progress Notes (Signed)
  Location of Breast Cancer: Right Breast Upper outer Quadrant    FUN after surgery and oncotype for planning.  Histology per Pathology Report: Diagnosis 06/12/17: 1. Breast, right, needle core biopsy, 10:00 o'clock - INVASIVE DUCTAL CARCINOMA- SEE COMMENT 2. Breast, right, needle core biopsy, anterior UOQ - FIBROADENOMATOID NODULE WITH CALCIFICATIONS- NO CARCINOMA IDENTIFIED 3. Breast, right, needle core biopsy, posterior UOQ- FIBROCYSTIC CHANGES AND FIBROADENOMATOID NODULE WITH CALCIFICATIONS- NO CARCINOMA IDENTIFIED  Receptor Status: ER(95%+), PR (30%+), Her2-neu (neg ratio=1.36), Ki-(15%)  Did patient present with symptoms (if so, please note symptoms) or was this found on screening mammography?:  Routine mammogram  Past/Anticipated interventions by surgeon, if any: Alphonsa Overall Diagnosis 07-20-17   Dr. Alphonsa Overall 1. Breast, lumpectomy, Right - INVASIVE DUCTAL CARCINOMA, 1.7 CM, MSBR GRADE II. - MARGINS NOT INVOLVED. - TUMOR 0.1 CM FROM ANTERIOR MARGIN. - PREVIOUS BIOPSY SITE AND CLIP. 2. Lymph node, sentinel, biopsy, Right Axillary - ONE BENIGN LYMPH NODE (0/1). 3. Lymph node, sentinel, biopsy, Right - ONE BENIGN LYMPH NODE (0/1). 4. Skin , Right Breast - BENIGN SKIN AND SUBCUTANEOUS ADIPOSE TISSUE. - NO EVIDENCE OF MALIGNANCY  Receptor Status: ER(95%+), PR (30%+), Her2-neu (neg ratio=1.36), Ki-(15%)    cardiac clearance 06-30-17 Brittainy Simmons PA-C  Past/Anticipated interventions by medical oncology, if any: Chemotherapy : 07-27-17 Dr. Hassan Rowan  1. Oncotype DX testing result 14  08-03-17 to determine if chemotherapy would be of any benefit followed by 2. Adjuvant radiation therapy followed by 3. Adjuvant antiestrogen therapy   appt 06/26/17: follow up after surgery  Lymphedema issues, if any: No ROM good to right arm. Skin to right breast is healing well with some tanning and numbness.  Had a follow up with Dr. Lucia Gaskins a few weeks ago will see ago in 6  months. Pain issues, if any:No, low back pain ongoing   SAFETY ISSUES: yes walks with cane at times,  Prior radiation? No  Pacemaker/ICD?  No  Possible current pregnancy? No  Is the patient on methotrexate? 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

## 2017-08-16 ENCOUNTER — Ambulatory Visit
Admission: RE | Admit: 2017-08-16 | Discharge: 2017-08-16 | Disposition: A | Discharge status: Home / Self Care | Payer: Medicare PPO | Source: Ambulatory Visit | Attending: Radiation Oncology | Admitting: Radiation Oncology

## 2017-08-16 ENCOUNTER — Other Ambulatory Visit: Payer: Self-pay

## 2017-08-16 ENCOUNTER — Encounter: Payer: Self-pay | Admitting: Radiation Oncology

## 2017-08-16 VITALS — BP 161/68 | HR 74 | Temp 98.1°F | Resp 20 | Ht 67.75 in | Wt 248.4 lb

## 2017-08-16 DIAGNOSIS — F329 Major depressive disorder, single episode, unspecified: Secondary | ICD-10-CM | POA: Insufficient documentation

## 2017-08-16 DIAGNOSIS — I2510 Atherosclerotic heart disease of native coronary artery without angina pectoris: Secondary | ICD-10-CM | POA: Diagnosis not present

## 2017-08-16 DIAGNOSIS — Z8673 Personal history of transient ischemic attack (TIA), and cerebral infarction without residual deficits: Secondary | ICD-10-CM | POA: Insufficient documentation

## 2017-08-16 DIAGNOSIS — Z7984 Long term (current) use of oral hypoglycemic drugs: Secondary | ICD-10-CM | POA: Diagnosis not present

## 2017-08-16 DIAGNOSIS — E119 Type 2 diabetes mellitus without complications: Secondary | ICD-10-CM | POA: Insufficient documentation

## 2017-08-16 DIAGNOSIS — Z803 Family history of malignant neoplasm of breast: Secondary | ICD-10-CM | POA: Insufficient documentation

## 2017-08-16 DIAGNOSIS — E785 Hyperlipidemia, unspecified: Secondary | ICD-10-CM | POA: Diagnosis not present

## 2017-08-16 DIAGNOSIS — Z170 Estrogen receptor positive status [ER+]: Secondary | ICD-10-CM | POA: Insufficient documentation

## 2017-08-16 DIAGNOSIS — Z87891 Personal history of nicotine dependence: Secondary | ICD-10-CM | POA: Insufficient documentation

## 2017-08-16 DIAGNOSIS — Z8614 Personal history of Methicillin resistant Staphylococcus aureus infection: Secondary | ICD-10-CM | POA: Insufficient documentation

## 2017-08-16 DIAGNOSIS — I10 Essential (primary) hypertension: Secondary | ICD-10-CM | POA: Insufficient documentation

## 2017-08-16 DIAGNOSIS — C50411 Malignant neoplasm of upper-outer quadrant of right female breast: Secondary | ICD-10-CM | POA: Diagnosis not present

## 2017-08-16 DIAGNOSIS — M129 Arthropathy, unspecified: Secondary | ICD-10-CM | POA: Insufficient documentation

## 2017-08-16 DIAGNOSIS — Z8041 Family history of malignant neoplasm of ovary: Secondary | ICD-10-CM | POA: Insufficient documentation

## 2017-08-16 DIAGNOSIS — Z86010 Personal history of colonic polyps: Secondary | ICD-10-CM | POA: Insufficient documentation

## 2017-08-16 DIAGNOSIS — D509 Iron deficiency anemia, unspecified: Secondary | ICD-10-CM | POA: Insufficient documentation

## 2017-08-16 DIAGNOSIS — Z800 Family history of malignant neoplasm of digestive organs: Secondary | ICD-10-CM | POA: Diagnosis not present

## 2017-08-16 DIAGNOSIS — Z8619 Personal history of other infectious and parasitic diseases: Secondary | ICD-10-CM | POA: Insufficient documentation

## 2017-08-16 DIAGNOSIS — K219 Gastro-esophageal reflux disease without esophagitis: Secondary | ICD-10-CM | POA: Insufficient documentation

## 2017-08-16 DIAGNOSIS — Z809 Family history of malignant neoplasm, unspecified: Secondary | ICD-10-CM

## 2017-08-16 NOTE — Progress Notes (Signed)
Radiation Oncology         [phone] ________________________________  Name: [name]        MRN: [id number]  Date of Service: 08/16/2017 DOB: 26-Apr-1951  IR:CVELFYBO, Stacy Peppers, FNP  Nicholas Lose, MD     REFERRING PHYSICIAN: Nicholas Lose, MD   DIAGNOSIS: The encounter diagnosis was Malignant neoplasm of upper-outer quadrant of right breast in female, estrogen receptor positive ([place]).   HISTORY OF PRESENT ILLNESS: [name] is a 67 y.o. female originally seen at the request of Dr. Lucia Gaskins  for a new diagnosis of right breast cancer. The patient was noted to have a screening detected mass in the upper outer quadrant of the right breast. The underwent diagnostic imaging which revealed concerns for a 2 x 1.7 cm mass at 10:00 in the upper outer quadrant of the right breast as well as a grouping of calcifications that spanned about 5 mm in the upper outer quadrant as well. She underwent a biopsy of these sites on 06/12/17 which revealed a grade invasive ductal carcinoma of the right breast, ER/PR positive, HER2 negative with a Ki 67 of 15%. The other two sites were fibroadenomatous changes that corresponded to the calcifications that were sampled.  She underwent lumpectomy on 07/20/17 revealing a 1.7 cm, grade 2 invasive ductal carcinoma 1 mm from the anterior margin, but negative overall. Of the two nodes sampled, none contained disease. She also had a skin excision that was also benign. She comes today to discuss options of adjuvant radiotherapy.   PREVIOUS RADIATION THERAPY: No   PAST MEDICAL HISTORY:  Past Medical History:  Diagnosis Date  . Abscess in epidural space of L2-L5 lumbar spine 03/2006  . Anemia, iron deficiency   . Arthritis   . Breast cancer ([place]) 06/12/2017   right breast  . CAD (coronary artery disease), native coronary artery 03/30/2015  . Cataract   . Colon polyp    a. Multiple colonic polyps status post colonoscopy in October 2007, consistent with  tubular adenoma, tubulovillous adenoma with no high-grade dysplasia or malignancy identified.   . Constipation   . Coronary artery disease    a. s/p CABG 06/2010: S-D2; S-PDA (at time of MV surgery).  . Depression   . Diabetes mellitus    type II  . GERD (gastroesophageal reflux disease)   . Hx of transient ischemic attack (TIA)    a. See stroke section.  . Hyperlipidemia   . Hypertension   . Hypoxia    a. Has history of acute hypoxic respiratory failure in the setting of bronchitis/PNA or prior admissions.  [name] MRSA infection    a. History of recurrent skin infection and soft tissue abscesses, with MRSA positive in the past.  . 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 mitral valve - s/p resection and MV repair 06/2010 Dr. [name].  . 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 Sleep apnea   . Stenosis of middle cerebral artery    a. Distal R MCA.  . Stroke [facility])    a. 11/2009: mitral mass diagnosed at this time, also has distal R MCA stenosis, tx with coumadin. b. Readmitted 05/2010 with TIA symptoms - had not been taking Coumadin. s/p MV surgery 06/2010. Coumadin stopped 2013 after review of chart by Dr. [name] since mass was removed (stroke felt possibly related to this).        PAST SURGICAL HISTORY: Past Surgical History:  Procedure Laterality Date  . BREAST LUMPECTOMY WITH RADIOACTIVE SEED  AND SENTINEL LYMPH NODE BIOPSY Right 07/20/2017   Procedure: RIGHT BREAST LUMPECTOMY WITH RADIOACTIVE SEED AND SENTINEL LYMPH NODE BIOPSY;  Surgeon: Alphonsa Overall, MD;  Location: [facility];  Service: General;  Laterality: Right;  . CARDIAC CATHETERIZATION  04/03/2015   Procedure: Left Heart Cath and Cors/Grafts Angiography;  Surgeon: Peter M Martinique, MD;  Location: [facility];  Service: Cardiovascular;;  . CORONARY ARTERY BYPASS GRAFT  1/12  . history of ankle fractures requiring surgery    . LUMBAR LAMINECTOMY/DECOMPRESSION MICRODISCECTOMY Left 04/15/2015   Procedure: LUMBAR  LAMINECTOMY/DECOMPRESSION MICRODISCECTOMY 1 LEVEL;  Surgeon: Newman Pies, MD;  Location: [place] [facility]UR[facility];  Service: Neurosurgery;  Laterality: Left;  Left L23 microdiskectomy  . MV repair and resection of mass  1/12  . TONSILLECTOMY  1971  . TOTAL ABDOMINAL HYSTERECTOMY  1978  . VESICOVAGINAL FISTULA CLOSURE W/ TAH       FAMILY HISTORY:  Family History  Problem Relation Age of Onset  . Colon cancer Mother   . Irritable bowel syndrome Mother   . Breast cancer Mother   . Prostate cancer Father   . Cirrhosis Sister        died of GI bleed associated with cirrhosis of the liver  . Stroke Brother   . Colon cancer Brother   . Diabetes Brother        x3  . Kidney disease Brother   . Breast cancer Cousin        several 1st and 2nd cousins with breast cancer     SOCIAL HISTORY:  reports that she quit smoking about 6 years ago. she has never used smokeless tobacco. She reports that she does not drink alcohol or use drugs. The patient is married and lives in [place]. She is accompanied by her husband.  ALLERGIES: Patient has no known allergies.   MEDICATIONS:  Current Outpatient Medications  Medication Sig Dispense Refill  . ACCU-CHEK SOFTCLIX LANCETS lancets Use as instructed 100 each 12  . albuterol (PROVENTIL HFA;VENTOLIN HFA) 108 (90 Base) MCG/ACT inhaler Inhale 2 puffs into the lungs every 6 (six) hours as needed for wheezing or shortness of breath. 1 Inhaler 2  . amLODipine (NORVASC) 10 MG tablet Take 1 tablet (10 mg total) by mouth daily. 30 tablet 0  . aspirin EC 81 MG tablet Take 1 tablet (81 mg total) by mouth daily. 30 tablet 5  . atorvastatin (LIPITOR) 40 MG tablet Take 1 tablet (40 mg total) by mouth daily. 30 tablet 0  . Blood Glucose Monitoring Suppl (ACCU-CHEK AVIVA PLUS) w/Device KIT Use as directed 1 kit 0  . buPROPion (WELLBUTRIN XL) 150 MG 24 hr tablet Take 1 tablet (150 mg total) by mouth daily. 30 tablet 0  . carvedilol (COREG) 6.25 MG tablet Take 1 tablet  (6.25 mg total) by mouth 2 (two) times daily with a meal. 60 tablet 0  . celecoxib (CELEBREX) 200 MG capsule Take 1 capsule (200 mg total) by mouth 2 (two) times daily as needed. (Patient taking differently: Take 200 mg by mouth 2 (two) times daily as needed for mild pain. ) 60 capsule 3  . diclofenac sodium (VOLTAREN) 1 % GEL Apply 2 g topically 4 (four) times daily as needed. (Patient taking differently: Apply 2 g topically 4 (four) times daily as needed (pain). ) 1 Tube 1  . docusate sodium (COLACE) 100 MG capsule Take 1 capsule (100 mg total) by mouth 2 (two) times daily. (Patient not taking: Reported on 07/11/2017) 60 capsule 0  .  glipiZIDE (GLUCOTROL) 5 MG tablet Take 1 tablet (5 mg total) by mouth daily before breakfast. 30 tablet 0  . glucose blood (ACCU-CHEK AVIVA PLUS) test strip Use as instructed 100 each 12  . hydrochlorothiazide (HYDRODIURIL) 25 MG tablet Take 1 tablet (25 mg total) by mouth daily. 30 tablet 0  . ibuprofen (ADVIL,MOTRIN) 800 MG tablet TAKE 1 TABLET BY MOUTH EVERY 8 HOURS AS NEEDED FOR MODERATE PAIN OR CRAMPING. TAKE WITH FOOD. (Patient taking differently: Take 800 mg by mouth every 8 (eight) hours as needed. FOR MODERATE PAIN OR CRAMPING. TAKE WITH FOOD.) 30 tablet 0  . linagliptin (TRADJENTA) 5 MG TABS tablet Take 5 mg by mouth daily.    [name] losartan (COZAAR) 100 MG tablet Take 1 tablet (100 mg total) by mouth daily. 90 tablet 1  . metFORMIN (GLUCOPHAGE) 1000 MG tablet Take 1 tablet (1,000 mg total) by mouth 2 (two) times daily with a meal. 60 tablet 0  . Multiple Vitamins-Minerals (CENTRUM SILVER 50+WOMEN) TABS Take 1 tablet by mouth daily.    [name] oxyCODONE (OXY IR/ROXICODONE) 5 MG immediate release tablet Take 1 tablet (5 mg total) by mouth every 6 (six) hours as needed for severe pain. 20 tablet 0  . pantoprazole (PROTONIX) 40 MG tablet Take 1 tablet (40 mg total) by mouth daily. 30 tablet 0  . polyethylene glycol (MIRALAX / GLYCOLAX) packet Take 17 g by mouth daily as  needed for mild constipation or moderate constipation. 30 each 2  . traZODone (DESYREL) 50 MG tablet Take 1 tablet (50 mg total) by mouth at bedtime as needed for sleep. (Patient taking differently: Take 50 mg by mouth at bedtime. ) 30 tablet 0   No current facility-administered medications for this encounter.      REVIEW OF SYSTEMS: On review of systems, the patient reports that she is doing well overall. She denies any chest pain, shortness of breath, cough, fevers, chills, night sweats, unintended weight changes. She denies any bowel or bladder disturbances, and denies abdominal pain, nausea or vomiting. She denies any new musculoskeletal or joint aches or pains, new skin lesions or concerns. A complete review of systems is obtained and is otherwise negative.      PHYSICAL EXAM:  Wt Readings from Last 3 Encounters:  07/27/17 245 lb (111.1 kg)  07/20/17 247 lb (112 kg)  07/14/17 247 lb 7 oz (112.2 kg)   Temp Readings from Last 3 Encounters:  07/27/17 98.5 F (36.9 C) (Oral)  07/20/17 98 F (36.7 C)  07/14/17 98.4 F (36.9 C) (Oral)   BP Readings from Last 3 Encounters:  07/27/17 (!) 143/66  07/20/17 123/62  07/14/17 (!) 156/60   Pulse Readings from Last 3 Encounters:  07/27/17 88  07/20/17 64  07/14/17 74     In general this is a well appearing African American  female in no acute distress. She is alert and oriented x4 and appropriate throughout the examination. HEENT reveals that the patient is normocephalic, atraumatic. EOMs are intact. PERRLA. Skin is intact without any evidence of gross lesions.  Cardiopulmonary assessment is negative for acute distress and she exhibits normal effort. The right breast is evaluated and reveals a well healed incision site without edema of the chest wall or RUE.    ECOG = 0  0 - Asymptomatic (Fully active, able to carry on all predisease activities without restriction)  1 - Symptomatic but completely ambulatory (Restricted in  physically strenuous activity but ambulatory and able to carry out work of  a light or sedentary nature. For example, light housework, office work)  2 - Symptomatic, <50% in bed during the day (Ambulatory and capable of all self care but unable to carry out any work activities. Up and about more than 50% of waking hours)  3 - Symptomatic, >50% in bed, but not bedbound (Capable of only limited self-care, confined to bed or chair 50% or more of waking hours)  4 - Bedbound (Completely disabled. Cannot carry on any self-care. Totally confined to bed or chair)  5 - Death   Eustace Pen MM, Creech RH, Tormey DC, et al. [phone]). "Toxicity and response criteria of the [facility]". [place] Oncol. 5 (6): 649-55    LABORATORY DATA:  Lab Results  Component Value Date   WBC 8.9 07/14/2017   HGB 10.9 (L) 07/14/2017   HCT 34.6 (L) 07/14/2017   MCV 81.4 07/14/2017   PLT 297 07/14/2017   Lab Results  Component Value Date   NA 139 07/14/2017   K 4.4 07/14/2017   CL 105 07/14/2017   CO2 23 07/14/2017   Lab Results  Component Value Date   ALT 11 09/26/2016   AST 19 09/26/2016   ALKPHOS 66 09/26/2016   BILITOT 0.3 09/26/2016      RADIOGRAPHY: Nm Sentinel Node Inj-no Rpt (breast)  Result Date: 07/20/2017 Sulfur colloid was injected by the nuclear medicine technologist for melanoma sentinel node.   Mm Breast Surgical Specimen  Result Date: 07/20/2017 CLINICAL DATA:  Evaluate specimen EXAM: SPECIMEN RADIOGRAPH OF THE RIGHT BREAST COMPARISON:  Previous exam(s). FINDINGS: Status post excision of the right breast. The radioactive seed and biopsy marker clip are present, completely intact, and were marked for pathology. IMPRESSION: Specimen radiograph of the right breast. Electronically Signed   By: Dorise Bullion III M.D   On: 07/20/2017 08:25   [place] Rt Radioactive Seed Loc  Result Date: 07/19/2017 CLINICAL DATA:  Preoperative radioactive seed localization of right breast  10:30 o'clock mass. EXAM: ULTRASOUND GUIDED RADIOACTIVE SEED LOCALIZATION OF THE RIGHT BREAST COMPARISON:  Previous exam(s). FINDINGS: Patient presents for radioactive seed localization prior to right breast lumpectomy. I met with the patient and we discussed the procedure of seed localization including benefits and alternatives. We discussed the high likelihood of a successful procedure. We discussed the risks of the procedure including infection, bleeding, tissue injury and further surgery. We discussed the low dose of radioactivity involved in the procedure. Informed, written consent was given. The usual time-out protocol was performed immediately prior to the procedure. Using ultrasound guidance, sterile technique, 1% lidocaine and an I-125 radioactive seed, right breast 10:30 o'clock mass was localized using a inferior approach. The follow-up mammogram images confirm the seed in the expected location and were marked for Dr. Lucia Gaskins. Follow-up survey of the patient confirms presence of the radioactive seed. Order number of I-125 seed:  [id number]. Total activity:  2.725 millicurie reference Date: July 19, 2017 The patient tolerated the procedure well and was released from the [place]. She was given instructions regarding seed removal. IMPRESSION: Radioactive seed localization right breast. No apparent complications. Electronically Signed   By: Fidela Salisbury M.D.   On: 07/19/2017 15:43   Mm Clip Placement Right  Result Date: 07/19/2017 CLINICAL DATA:  Post radioactive seed localization of right breast 10:30 o'clock mass. EXAM: DIAGNOSTIC RIGHT MAMMOGRAM POST ULTRASOUND GUIDED RADIOACTIVE SEED LOCALIZATION. COMPARISON:  Previous exam(s). FINDINGS: Mammographic images were obtained following ultrasound guided radioactive seed localization of right breast 10:30 o'clock mass.  Two-view mammography demonstrates presence of radioactive seed within the mass of interest, adjacent to a ribbon shaped  marker. IMPRESSION: Successful placement of radioactive seed within right breast 10:30 o'clock biopsy-proven malignancy. Final Assessment: Post Procedure Mammograms for Marker Placement Electronically Signed   By: Fidela Salisbury M.D.   On: 07/19/2017 15:45       IMPRESSION/PLAN: 1. Stage IA, pT1cN0M0, grade 2, ER/PR positive invasive ductal carcinoma of the right breast. Dr. Lisbeth Renshaw reviews her final pathology and the role for external radiotherapy to the breast followed by antiestrogen therapy. We discussed the risks, benefits, short, and long term effects of radiotherapy, and the patient is interested in proceeding. Dr. Lisbeth Renshaw discusses the delivery and logistics of radiotherapy and anticipates a course of 4 weeks. Written consent is obtained and placed in the chart, a copy was provided to the patient. She will be contacted for simulation appointment to occur next week.  2. Possible genetic predisposition to malignancy. The patient is elligible for genetic testing, she is interested in testing and will be referred for evaluation. 3.  Research project for understanding the patient experience.  In our department we are currently looking at a better way to understand patient experience for those undergoing radiotherapy.  She was counseled on the opportunity to participate in this project, after discussing the logistics of this, the patient is interested in proceeding.   In a visit lasting 25 minutes, greater than 50% of the time was spent face to face discussing her case, and coordinating the patient's care.   The above documentation reflects my direct findings during this shared patient visit. Please see the separate note by Dr. Lisbeth Renshaw on this date for the remainder of the patient's plan of care.    Carola Rhine, PAC

## 2017-08-17 ENCOUNTER — Ambulatory Visit
Admission: RE | Admit: 2017-08-17 | Discharge: 2017-08-17 | Disposition: A | Discharge status: Home / Self Care | Payer: Medicare PPO | Source: Ambulatory Visit | Attending: Radiation Oncology | Admitting: Radiation Oncology

## 2017-08-17 DIAGNOSIS — Z510 Encounter for antineoplastic radiation therapy: Secondary | ICD-10-CM | POA: Insufficient documentation

## 2017-08-17 DIAGNOSIS — C50411 Malignant neoplasm of upper-outer quadrant of right female breast: Secondary | ICD-10-CM | POA: Insufficient documentation

## 2017-08-17 DIAGNOSIS — Z170 Estrogen receptor positive status [ER+]: Secondary | ICD-10-CM | POA: Diagnosis not present

## 2017-08-18 ENCOUNTER — Telehealth: Payer: Self-pay | Admitting: Hematology and Oncology

## 2017-08-18 ENCOUNTER — Telehealth: Payer: Self-pay | Admitting: Genetic Counselor

## 2017-08-18 DIAGNOSIS — Z510 Encounter for antineoplastic radiation therapy: Secondary | ICD-10-CM | POA: Diagnosis not present

## 2017-08-18 NOTE — Progress Notes (Signed)
  Radiation Oncology         [phone] ________________________________  Name: [name] MRN: [id number]  Date: 08/17/2017  DOB: [birth year]  Optical Surface Tracking Plan:  Since intensity modulated radiotherapy (IMRT) and 3D conformal radiation treatment methods are predicated on accurate and precise positioning for treatment, intrafraction motion monitoring is medically necessary to ensure accurate and safe treatment delivery.  The ability to quantify intrafraction motion without excessive ionizing radiation dose can only be performed with optical surface tracking. Accordingly, surface imaging offers the opportunity to obtain 3D measurements of patient position throughout IMRT and 3D treatments without excessive radiation exposure.  I am ordering optical surface tracking for this patient's upcoming course of radiotherapy. ________________________________  Kyung Rudd, MD 08/18/2017 1:46 PM    Reference:   Ursula Alert, J, et al. Surface imaging-based analysis of intrafraction motion for breast radiotherapy patients.Journal of [place], n. 6, nov. 2014. ISSN [id number].   Available at: <http://www.jacmp.org/index.php/jacmp/article/view/4957>.

## 2017-08-18 NOTE — Telephone Encounter (Signed)
Mailed patient calendar of upcoming march appointments per 2/22 sch message.

## 2017-08-18 NOTE — Progress Notes (Signed)
  Radiation Oncology         [phone] ________________________________  Name: [name] MRN: [id number]  Date: 08/17/2017  DOB: 1950-07-28   DIAGNOSIS:     ICD-10-CM   1. Malignant neoplasm of upper-outer quadrant of right breast in female, estrogen receptor positive ([place]) C50.411    Z17.0     SIMULATION AND TREATMENT PLANNING NOTE  The patient presented for simulation prior to beginning her course of radiation treatment for her diagnosis of right-sided breast cancer. The patient was placed in a supine position on a breast board. A customized vac-lock bag was constructed and this complex treatment device will be used on a daily basis during her treatment. In this fashion, a CT scan was obtained through the chest area and an isocenter was placed near the chest wall within the breast.  The patient will be planned to receive a course of radiation initially to a dose of 42.5 Gy. This will consist of a whole breast radiotherapy technique. To accomplish this, 2 customized blocks have been designed which will correspond to medial and lateral whole breast tangent fields. This treatment will be accomplished at 2.5 Gy per fraction. A forward planning technique will also be evaluated to determine if this approach improves the plan. It is anticipated that the patient will then receive a 7.5 Gy boost to the seroma cavity which has been contoured. This will be accomplished at 2.5 Gy per fraction.   This initial treatment will consist of a 3-D conformal technique. The seroma has been contoured as the primary target structure. Additionally, dose volume histograms of both this target as well as the lungs and heart will also be evaluated. Such an approach is necessary to ensure that the target area is adequately covered while the nearby critical  normal structures are adequately spared.  Plan:  The final anticipated total dose therefore will correspond to 50  Gy.    _______________________________   Jodelle Gross, MD, PhD

## 2017-08-18 NOTE — Telephone Encounter (Signed)
Scheduled genetics appt per 2/22 sch message - pt aware.

## 2017-08-21 ENCOUNTER — Ambulatory Visit: Payer: Medicare PPO | Admitting: Radiation Oncology

## 2017-08-22 ENCOUNTER — Encounter: Payer: Self-pay | Admitting: Radiation Oncology

## 2017-08-22 ENCOUNTER — Ambulatory Visit
Admission: RE | Admit: 2017-08-22 | Discharge: 2017-08-22 | Disposition: A | Discharge status: Home / Self Care | Payer: Medicare PPO | Source: Ambulatory Visit | Attending: Radiation Oncology | Admitting: Radiation Oncology

## 2017-08-22 DIAGNOSIS — Z510 Encounter for antineoplastic radiation therapy: Secondary | ICD-10-CM | POA: Diagnosis not present

## 2017-08-22 NOTE — Progress Notes (Signed)
Financial Counseling--Spoke with patient today regarding financial concerns--she has signed up for [employer] transportation grant and will also bring in income verification to apply for 1,000.00 [employer]

## 2017-08-23 ENCOUNTER — Ambulatory Visit
Admission: RE | Admit: 2017-08-23 | Discharge: 2017-08-23 | Disposition: A | Discharge status: Home / Self Care | Payer: Medicare PPO | Source: Ambulatory Visit | Attending: Radiation Oncology | Admitting: Radiation Oncology

## 2017-08-23 DIAGNOSIS — Z510 Encounter for antineoplastic radiation therapy: Secondary | ICD-10-CM | POA: Diagnosis not present

## 2017-08-24 ENCOUNTER — Ambulatory Visit
Admission: RE | Admit: 2017-08-24 | Discharge: 2017-08-24 | Disposition: A | Discharge status: Home / Self Care | Payer: Medicare PPO | Source: Ambulatory Visit | Attending: Radiation Oncology | Admitting: Radiation Oncology

## 2017-08-24 ENCOUNTER — Ambulatory Visit: Payer: Medicare PPO | Admitting: Radiation Oncology

## 2017-08-24 DIAGNOSIS — Z510 Encounter for antineoplastic radiation therapy: Secondary | ICD-10-CM | POA: Diagnosis not present

## 2017-08-25 ENCOUNTER — Ambulatory Visit
Admission: RE | Admit: 2017-08-25 | Discharge: 2017-08-25 | Disposition: A | Discharge status: Home / Self Care | Payer: Medicare PPO | Source: Ambulatory Visit | Attending: Radiation Oncology | Admitting: Radiation Oncology

## 2017-08-25 DIAGNOSIS — Z510 Encounter for antineoplastic radiation therapy: Secondary | ICD-10-CM | POA: Diagnosis present

## 2017-08-25 DIAGNOSIS — C50411 Malignant neoplasm of upper-outer quadrant of right female breast: Secondary | ICD-10-CM

## 2017-08-25 DIAGNOSIS — Z170 Estrogen receptor positive status [ER+]: Secondary | ICD-10-CM | POA: Diagnosis not present

## 2017-08-25 MED ORDER — RADIAPLEXRX EX GEL
Freq: Once | CUTANEOUS | Status: AC
  Administered 2017-08-25: 14:00:00 via TOPICAL

## 2017-08-25 MED ORDER — ALRA NON-METALLIC DEODORANT (RAD-ONC)
1.0000 "application " | Freq: Once | TOPICAL | Status: AC
  Administered 2017-08-25: 1 via TOPICAL

## 2017-08-28 ENCOUNTER — Ambulatory Visit
Admission: RE | Admit: 2017-08-28 | Discharge: 2017-08-28 | Disposition: A | Discharge status: Home / Self Care | Payer: Medicare PPO | Source: Ambulatory Visit | Attending: Radiation Oncology | Admitting: Radiation Oncology

## 2017-08-28 DIAGNOSIS — Z510 Encounter for antineoplastic radiation therapy: Secondary | ICD-10-CM | POA: Diagnosis not present

## 2017-08-29 ENCOUNTER — Ambulatory Visit
Admission: RE | Admit: 2017-08-29 | Discharge: 2017-08-29 | Disposition: A | Discharge status: Home / Self Care | Payer: Medicare PPO | Source: Ambulatory Visit | Attending: Radiation Oncology | Admitting: Radiation Oncology

## 2017-08-29 DIAGNOSIS — Z510 Encounter for antineoplastic radiation therapy: Secondary | ICD-10-CM | POA: Diagnosis not present

## 2017-08-30 ENCOUNTER — Ambulatory Visit
Admission: RE | Admit: 2017-08-30 | Discharge: 2017-08-30 | Disposition: A | Discharge status: Home / Self Care | Payer: Medicare PPO | Source: Ambulatory Visit | Attending: Radiation Oncology | Admitting: Radiation Oncology

## 2017-08-30 DIAGNOSIS — Z510 Encounter for antineoplastic radiation therapy: Secondary | ICD-10-CM | POA: Diagnosis not present

## 2017-08-31 ENCOUNTER — Ambulatory Visit
Admission: RE | Admit: 2017-08-31 | Discharge: 2017-08-31 | Disposition: A | Discharge status: Home / Self Care | Payer: Medicare PPO | Source: Ambulatory Visit | Attending: Radiation Oncology | Admitting: Radiation Oncology

## 2017-08-31 DIAGNOSIS — Z510 Encounter for antineoplastic radiation therapy: Secondary | ICD-10-CM | POA: Diagnosis not present

## 2017-09-01 ENCOUNTER — Ambulatory Visit
Admission: RE | Admit: 2017-09-01 | Discharge: 2017-09-01 | Disposition: A | Discharge status: Home / Self Care | Payer: Medicare PPO | Source: Ambulatory Visit | Attending: Radiation Oncology | Admitting: Radiation Oncology

## 2017-09-01 DIAGNOSIS — Z510 Encounter for antineoplastic radiation therapy: Secondary | ICD-10-CM | POA: Diagnosis not present

## 2017-09-04 ENCOUNTER — Ambulatory Visit
Admission: RE | Admit: 2017-09-04 | Discharge: 2017-09-04 | Disposition: A | Discharge status: Home / Self Care | Payer: Medicare PPO | Source: Ambulatory Visit | Attending: Radiation Oncology | Admitting: Radiation Oncology

## 2017-09-04 DIAGNOSIS — Z510 Encounter for antineoplastic radiation therapy: Secondary | ICD-10-CM | POA: Diagnosis not present

## 2017-09-05 ENCOUNTER — Ambulatory Visit
Admission: RE | Admit: 2017-09-05 | Discharge: 2017-09-05 | Disposition: A | Discharge status: Home / Self Care | Payer: Medicare PPO | Source: Ambulatory Visit | Attending: Radiation Oncology | Admitting: Radiation Oncology

## 2017-09-05 DIAGNOSIS — Z510 Encounter for antineoplastic radiation therapy: Secondary | ICD-10-CM | POA: Diagnosis not present

## 2017-09-06 ENCOUNTER — Ambulatory Visit
Admission: RE | Admit: 2017-09-06 | Discharge: 2017-09-06 | Disposition: A | Discharge status: Home / Self Care | Payer: Medicare PPO | Source: Ambulatory Visit | Attending: Radiation Oncology | Admitting: Radiation Oncology

## 2017-09-06 DIAGNOSIS — Z510 Encounter for antineoplastic radiation therapy: Secondary | ICD-10-CM | POA: Diagnosis not present

## 2017-09-07 ENCOUNTER — Inpatient Hospital Stay: Payer: Medicare PPO

## 2017-09-07 ENCOUNTER — Encounter: Payer: Self-pay | Admitting: Genetic Counselor

## 2017-09-07 ENCOUNTER — Ambulatory Visit
Admission: RE | Admit: 2017-09-07 | Discharge: 2017-09-07 | Disposition: A | Discharge status: Home / Self Care | Payer: Medicare PPO | Source: Ambulatory Visit | Attending: Radiation Oncology | Admitting: Radiation Oncology

## 2017-09-07 ENCOUNTER — Inpatient Hospital Stay: Payer: Medicare PPO | Attending: Hematology and Oncology | Admitting: Genetic Counselor

## 2017-09-07 DIAGNOSIS — Z7982 Long term (current) use of aspirin: Secondary | ICD-10-CM | POA: Insufficient documentation

## 2017-09-07 DIAGNOSIS — L598 Other specified disorders of the skin and subcutaneous tissue related to radiation: Secondary | ICD-10-CM | POA: Insufficient documentation

## 2017-09-07 DIAGNOSIS — Z803 Family history of malignant neoplasm of breast: Secondary | ICD-10-CM | POA: Diagnosis not present

## 2017-09-07 DIAGNOSIS — Z79899 Other long term (current) drug therapy: Secondary | ICD-10-CM | POA: Insufficient documentation

## 2017-09-07 DIAGNOSIS — Z1379 Encounter for other screening for genetic and chromosomal anomalies: Secondary | ICD-10-CM | POA: Diagnosis not present

## 2017-09-07 DIAGNOSIS — Z8042 Family history of malignant neoplasm of prostate: Secondary | ICD-10-CM

## 2017-09-07 DIAGNOSIS — Z7984 Long term (current) use of oral hypoglycemic drugs: Secondary | ICD-10-CM | POA: Insufficient documentation

## 2017-09-07 DIAGNOSIS — C50411 Malignant neoplasm of upper-outer quadrant of right female breast: Secondary | ICD-10-CM | POA: Insufficient documentation

## 2017-09-07 DIAGNOSIS — Z170 Estrogen receptor positive status [ER+]: Secondary | ICD-10-CM | POA: Insufficient documentation

## 2017-09-07 DIAGNOSIS — Z510 Encounter for antineoplastic radiation therapy: Secondary | ICD-10-CM | POA: Diagnosis not present

## 2017-09-07 NOTE — Progress Notes (Signed)
[facility]      Initial Visit   Patient Name: [name] Patient DOB: [birth year] Patient Age: 67 y.o. Encounter Date: 09/07/2017  Referring Provider: Nicholas Lose, MD  Primary Care Provider: Alfonse Spruce, FNP  Reason for Visit: Evaluate for hereditary susceptibility to cancer    Assessment and Plan:  . [name] history is not highly suggestive of a hereditary predisposition to cancer. However, she knows very little information about her paternal family. Her mother had distant cousins with breast cancers. A genetics evaluation is indicated.  . Testing is recommended to determine whether she has a pathogenic mutation that will impact her screening and risk-reduction for cancer. A negative result will be reassuring.  . [name] wished to pursue genetic testing and a blood sample will be sent for analysis of the 83 genes on [facility]'s Multi-Cancer panel (ALK, APC, ATM, AXIN2, BAP1, BARD1, BLM, BMPR1A, BRCA1, BRCA2, BRIP1, CASR, CDC73, CDH1, CDK4, CDKN1B, CDKN1C, CDKN2A, CEBPA, CHEK2, CTNNA1, DICER1, DIS3L2, EGFR, EPCAM, FH, FLCN, GATA2, GPC3, GREM1, HOXB13, HRAS, KIT, MAX, MEN1, MET, MITF, MLH1, MSH2, MSH3, MSH6, MUTYH, NBN, NF1, NF2, NTHL1, PALB2, PDGFRA, PHOX2B, PMS2, POLD1, POLE, POT1, PRKAR1A, PTCH1, PTEN, RAD50, RAD51C, RAD51D, RB1, RECQL4, RET, RUNX1, SDHA, SDHAF2, SDHB, SDHC, SDHD, SMAD4, SMARCA4, SMARCB1, SMARCE1, STK11, SUFU, TERC, TERT, TMEM127, TP53, TSC1, TSC2, VHL, WRN, WT1).   . Results should be available in approximately 2-4 weeks, at which point we will contact her and address implications for her as well as address genetic testing for at-risk family members, if needed.     Dr. Lindi Adie was available for questions concerning this case. Total time spent by me in face-to-face counseling was approximately 30 minutes.   _____________________________________________________________________   History of Present Illness: Ms.  [name], a 67 y.o. female, is being seen at the [facility] due to a personal and family history of cancer. She presents to clinic today with her husband, Shanon Brow, to discuss the possibility of a hereditary predisposition to cancer and discuss whether genetic testing is warranted.  Ms. Taaffe was diagnosed with breast cancer at the age of 42. She is s/p lumpectomy and is completing radiation treatments.   She reportedly had colon polyps removed in 2007, but did not know the exact number or type(s). She has not had a colonoscopy since then.    Malignant neoplasm of upper-outer quadrant of right breast in female, estrogen receptor positive ([place])   06/12/2017 Initial Diagnosis    Right breast biopsy 10:00: IDC grade 2, ER 95%, PR 30%, Ki-67 15%, HER-2 negative ratio 1.36, 1.7 cm mass in the right upper outer quadrant, T1CN0 stage I a clinical stage      07/21/2017 Surgery    Rt Lumpectomy: Grade 2 IDC 1.7 cm, 0/2 LN Neg, Margins Neg, ER 95%, PR 30%, Ki-67 15%, HER-2 negative ratio 1.36 T1cN0 Stage 1A       Past Medical History:  Diagnosis Date  . Abscess in epidural space of L2-L5 lumbar spine 03/2006  . Anemia, iron deficiency   . Arthritis   . Breast cancer ([place]) 06/12/2017   right breast  . CAD (coronary artery disease), native coronary artery 03/30/2015  . Cataract   . Colon polyp    a. Multiple colonic polyps status post colonoscopy in October 2007, consistent with tubular adenoma, tubulovillous adenoma with no high-grade dysplasia or malignancy identified.   . Constipation   . Coronary artery disease  a. s/p CABG 06/2010: S-D2; S-PDA (at time of MV surgery).  . Depression   . Diabetes mellitus    type II  . Family history of breast cancer   . Family history of prostate cancer   . GERD (gastroesophageal reflux disease)   . Hx of transient ischemic attack (TIA)    a. See stroke section.  . Hyperlipidemia   . Hypertension   . Hypoxia    a. Has  history of acute hypoxic respiratory failure in the setting of bronchitis/PNA or prior admissions.  [name] MRSA infection    a. History of recurrent skin infection and soft tissue abscesses, with MRSA positive in the past.  . 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 mitral valve - s/p resection and MV repair 06/2010 Dr. [name].  . 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 Sleep apnea   . Stenosis of middle cerebral artery    a. Distal R MCA.  . Stroke [facility])    a. 11/2009: mitral mass diagnosed at this time, also has distal R MCA stenosis, tx with coumadin. b. Readmitted 05/2010 with TIA symptoms - had not been taking Coumadin. s/p MV surgery 06/2010. Coumadin stopped 2013 after review of chart by Dr. [name] since mass was removed (stroke felt possibly related to this).     Past Surgical History:  Procedure Laterality Date  . BREAST LUMPECTOMY WITH RADIOACTIVE SEED AND SENTINEL LYMPH NODE BIOPSY Right 07/20/2017   Procedure: RIGHT BREAST LUMPECTOMY WITH RADIOACTIVE SEED AND SENTINEL LYMPH NODE BIOPSY;  Surgeon: Alphonsa Overall, MD;  Location: [place];  Service: General;  Laterality: Right;  . CARDIAC CATHETERIZATION  04/03/2015   Procedure: Left Heart Cath and Cors/Grafts Angiography;  Surgeon: Peter M Martinique, MD;  Location: [place] [facility];  Service: Cardiovascular;;  . CORONARY ARTERY BYPASS GRAFT  1/12  . history of ankle fractures requiring surgery    . LUMBAR LAMINECTOMY/DECOMPRESSION MICRODISCECTOMY Left 04/15/2015   Procedure: LUMBAR LAMINECTOMY/DECOMPRESSION MICRODISCECTOMY 1 LEVEL;  Surgeon: Newman Pies, MD;  Location: [place] [facility];  Service: Neurosurgery;  Laterality: Left;  Left L23 microdiskectomy  . MV repair and resection of mass  1/12  . TONSILLECTOMY  1971  . TOTAL ABDOMINAL HYSTERECTOMY  1978  . VESICOVAGINAL FISTULA CLOSURE W/ TAH      Social History   Socioeconomic History  . Marital status: Married    Spouse name: Not on file  . Number of children: 3  . Years of  education: Not on file  . Highest education level: Not on file  Social Needs  . Financial resource strain: Not on file  . Food insecurity - worry: Not on file  . Food insecurity - inability: Not on file  . Transportation needs - medical: Not on file  . Transportation needs - non-medical: Not on file  Occupational History    Employer: UNEMPLOYED  Tobacco Use  . Smoking status: Former Smoker    Last attempt to quit: 10/14/2010    Years since quitting: 6.9  . Smokeless tobacco: Never Used  Substance and Sexual Activity  . Alcohol use: No  . Drug use: No  . Sexual activity: No  Other Topics Concern  . Not on file  Social History Narrative   Lives with husband, stay at home, uses cane occasionally, still active/ambulatory.      Family History:  During the visit, a 4-generation pedigree was obtained. Family tree will be scanned in the Media tab in Epic  Significant diagnoses include the following:  Family History  Problem Relation Age of Onset  . Colon cancer Mother        dx 35s; deceased 16  . Irritable bowel syndrome Mother   . Prostate cancer Father        deceased 46s  . Cirrhosis Sister        died of GI bleed associated with cirrhosis of the liver  . Stroke Brother   . Prostate cancer Brother 59       deceased 57  . Diabetes Brother        x3  . Kidney disease Brother   . Cancer Brother 31       unk. type  . Breast cancer Cousin        several maternal 1st and 2nd cousins with breast cancer    Additionally, has two sons (ages 70 and 26) and a daughter (age 66). She had 3 full brothers and a full sister. She also had a paternal half-brother and half-sister. Her mother had 3 full sisters and 9 half-siblings. It's the children of the half-siblings who reportedly had breast cancers. Her father had multiple siblings, but she did not have information about them.  Ms. Remlinger ancestry is African American. There is no known Jewish ancestry and no  consanguinity.  Discussion: We reviewed the characteristics, features and inheritance patterns of hereditary cancer syndromes. We discussed her risk of harboring a mutation in the context of her personal and family history. We discussed that her unknown paternal family makes risk assessment somewhat challenging. We discussed the process of genetic testing, insurance coverage and implications of results: positive, negative and variant of unknown significance (VUS).    Ms. Kohrs questions were answered to her satisfaction today and she is welcome to call with any additional questions or concerns. Thank you for the referral and allowing [place] to share in the care of your patient.    Steele Berg, MS, [place] Certified Genetic Counselor phone: [phone] [email or web address]

## 2017-09-08 ENCOUNTER — Ambulatory Visit
Admission: RE | Admit: 2017-09-08 | Discharge: 2017-09-08 | Disposition: A | Discharge status: Home / Self Care | Payer: Medicare PPO | Source: Ambulatory Visit | Attending: Radiation Oncology | Admitting: Radiation Oncology

## 2017-09-08 ENCOUNTER — Ambulatory Visit: Payer: Medicare PPO | Admitting: Radiation Oncology

## 2017-09-08 DIAGNOSIS — Z510 Encounter for antineoplastic radiation therapy: Secondary | ICD-10-CM | POA: Diagnosis not present

## 2017-09-11 ENCOUNTER — Ambulatory Visit
Admission: RE | Admit: 2017-09-11 | Discharge: 2017-09-11 | Disposition: A | Discharge status: Home / Self Care | Payer: Medicare PPO | Source: Ambulatory Visit | Attending: Radiation Oncology | Admitting: Radiation Oncology

## 2017-09-11 DIAGNOSIS — Z510 Encounter for antineoplastic radiation therapy: Secondary | ICD-10-CM | POA: Diagnosis not present

## 2017-09-12 ENCOUNTER — Ambulatory Visit
Admission: RE | Admit: 2017-09-12 | Discharge: 2017-09-12 | Disposition: A | Discharge status: Home / Self Care | Payer: Medicare PPO | Source: Ambulatory Visit | Attending: Radiation Oncology | Admitting: Radiation Oncology

## 2017-09-12 DIAGNOSIS — Z510 Encounter for antineoplastic radiation therapy: Secondary | ICD-10-CM | POA: Diagnosis not present

## 2017-09-13 ENCOUNTER — Ambulatory Visit
Admission: RE | Admit: 2017-09-13 | Discharge: 2017-09-13 | Disposition: A | Discharge status: Home / Self Care | Payer: Medicare PPO | Source: Ambulatory Visit | Attending: Radiation Oncology | Admitting: Radiation Oncology

## 2017-09-13 DIAGNOSIS — Z510 Encounter for antineoplastic radiation therapy: Secondary | ICD-10-CM | POA: Diagnosis not present

## 2017-09-14 ENCOUNTER — Ambulatory Visit
Admission: RE | Admit: 2017-09-14 | Discharge: 2017-09-14 | Disposition: A | Discharge status: Home / Self Care | Payer: Medicare PPO | Source: Ambulatory Visit | Attending: Radiation Oncology | Admitting: Radiation Oncology

## 2017-09-14 DIAGNOSIS — Z510 Encounter for antineoplastic radiation therapy: Secondary | ICD-10-CM | POA: Diagnosis not present

## 2017-09-15 ENCOUNTER — Ambulatory Visit: Payer: Medicare PPO | Admitting: Hematology and Oncology

## 2017-09-15 ENCOUNTER — Ambulatory Visit
Admission: RE | Admit: 2017-09-15 | Discharge: 2017-09-15 | Disposition: A | Discharge status: Home / Self Care | Payer: Medicare PPO | Source: Ambulatory Visit | Attending: Radiation Oncology | Admitting: Radiation Oncology

## 2017-09-15 DIAGNOSIS — Z510 Encounter for antineoplastic radiation therapy: Secondary | ICD-10-CM | POA: Diagnosis not present

## 2017-09-15 NOTE — Progress Notes (Deleted)
Patient Care Team: Alfonse Spruce, FNP as PCP - General (Family Medicine) Nicholas Lose, MD as Consulting Physician (Hematology and Oncology) Kyung Rudd, MD as Consulting Physician (Radiation Oncology)  DIAGNOSIS:  Encounter Diagnosis  Name Primary?  . Malignant neoplasm of upper-outer quadrant of right breast in female, estrogen receptor positive ([place])     SUMMARY OF ONCOLOGIC HISTORY:   Malignant neoplasm of upper-outer quadrant of right breast in female, estrogen receptor positive ([place])   06/12/2017 Initial Diagnosis    Right breast biopsy 10:00: IDC grade 2, ER 95%, PR 30%, Ki-67 15%, HER-2 negative ratio 1.36, 1.7 cm mass in the right upper outer quadrant, T1CN0 stage I a clinical stage      07/21/2017 Surgery    Rt Lumpectomy: Grade 2 IDC 1.7 cm, 0/2 LN Neg, Margins Neg, ER 95%, PR 30%, Ki-67 15%, HER-2 negative ratio 1.36 T1cN0 Stage 1A      08/22/2017 - 09/15/2017 Radiation Therapy    Adjuvant radiation therapy      08/23/2017 Oncotype testing    Oncotype DX recurrence score 14: Risk of distant recurrence of 9 years: 4%, low risk       CHIEF COMPLIANT: Follow-up after radiation therapy  INTERVAL HISTORY: [name] is a 67 year old with above-mentioned history of right breast cancer treated with lumpectomy followed by radiation.  She had Oncotype DX recurrence score of 14 which was low risk.  She did not need systemic chemotherapy.  She is here after radiation therapy to discuss starting antiestrogen therapy.  She has mild radiation dermatitis.  REVIEW OF SYSTEMS:   Constitutional: Denies fevers, chills or abnormal weight loss Eyes: Denies blurriness of vision Ears, nose, mouth, throat, and face: Denies mucositis or sore throat Respiratory: Denies cough, dyspnea or wheezes Cardiovascular: Denies palpitation, chest discomfort Gastrointestinal:  Denies nausea, heartburn or change in bowel habits Skin: Denies abnormal skin rashes Lymphatics: Denies new  lymphadenopathy or easy bruising Neurological:Denies numbness, tingling or new weaknesses Behavioral/Psych: Mood is stable, no new changes  Extremities: No lower extremity edema Breast: Radiation dermatitis All other systems were reviewed with the patient and are negative.  I have reviewed the past medical history, past surgical history, social history and family history with the patient and they are unchanged from previous note.  ALLERGIES:  has No Known Allergies.  MEDICATIONS:  Current Outpatient Medications  Medication Sig Dispense Refill  . ACCU-CHEK SOFTCLIX LANCETS lancets Use as instructed 100 each 12  . albuterol (PROVENTIL HFA;VENTOLIN HFA) 108 (90 Base) MCG/ACT inhaler Inhale 2 puffs into the lungs every 6 (six) hours as needed for wheezing or shortness of breath. 1 Inhaler 2  . amLODipine (NORVASC) 10 MG tablet Take 1 tablet (10 mg total) by mouth daily. 30 tablet 0  . aspirin EC 81 MG tablet Take 1 tablet (81 mg total) by mouth daily. 30 tablet 5  . atorvastatin (LIPITOR) 40 MG tablet Take 1 tablet (40 mg total) by mouth daily. 30 tablet 0  . Blood Glucose Monitoring Suppl (ACCU-CHEK AVIVA PLUS) w/Device KIT Use as directed 1 kit 0  . buPROPion (WELLBUTRIN XL) 150 MG 24 hr tablet Take 1 tablet (150 mg total) by mouth daily. 30 tablet 0  . carvedilol (COREG) 6.25 MG tablet Take 1 tablet (6.25 mg total) by mouth 2 (two) times daily with a meal. 60 tablet 0  . celecoxib (CELEBREX) 200 MG capsule Take 1 capsule (200 mg total) by mouth 2 (two) times daily as needed. (Patient taking differently: Take 200  mg by mouth 2 (two) times daily as needed for mild pain. ) 60 capsule 3  . diclofenac sodium (VOLTAREN) 1 % GEL Apply 2 g topically 4 (four) times daily as needed. (Patient not taking: Reported on 08/16/2017) 1 Tube 1  . docusate sodium (COLACE) 100 MG capsule Take 1 capsule (100 mg total) by mouth 2 (two) times daily. 60 capsule 0  . glipiZIDE (GLUCOTROL) 5 MG tablet Take 1 tablet (5  mg total) by mouth daily before breakfast. 30 tablet 0  . glucose blood (ACCU-CHEK AVIVA PLUS) test strip Use as instructed 100 each 12  . hydrochlorothiazide (HYDRODIURIL) 25 MG tablet Take 1 tablet (25 mg total) by mouth daily. 30 tablet 0  . ibuprofen (ADVIL,MOTRIN) 800 MG tablet TAKE 1 TABLET BY MOUTH EVERY 8 HOURS AS NEEDED FOR MODERATE PAIN OR CRAMPING. TAKE WITH FOOD. (Patient taking differently: Take 800 mg by mouth every 8 (eight) hours as needed. FOR MODERATE PAIN OR CRAMPING. TAKE WITH FOOD.) 30 tablet 0  . linagliptin (TRADJENTA) 5 MG TABS tablet Take 5 mg by mouth daily.    [name] losartan (COZAAR) 100 MG tablet Take 1 tablet (100 mg total) by mouth daily. 90 tablet 1  . metFORMIN (GLUCOPHAGE) 1000 MG tablet Take 1 tablet (1,000 mg total) by mouth 2 (two) times daily with a meal. 60 tablet 0  . Multiple Vitamins-Minerals (CENTRUM SILVER 50+WOMEN) TABS Take 1 tablet by mouth daily.    [name] oxyCODONE (OXY IR/ROXICODONE) 5 MG immediate release tablet Take 1 tablet (5 mg total) by mouth every 6 (six) hours as needed for severe pain. 20 tablet 0  . pantoprazole (PROTONIX) 40 MG tablet Take 1 tablet (40 mg total) by mouth daily. 30 tablet 0  . polyethylene glycol (MIRALAX / GLYCOLAX) packet Take 17 g by mouth daily as needed for mild constipation or moderate constipation. 30 each 2  . traZODone (DESYREL) 50 MG tablet Take 1 tablet (50 mg total) by mouth at bedtime as needed for sleep. (Patient taking differently: Take 50 mg by mouth at bedtime. ) 30 tablet 0   No current facility-administered medications for this visit.     PHYSICAL EXAMINATION: ECOG PERFORMANCE STATUS: 1 - Symptomatic but completely ambulatory  There were no vitals filed for this visit. There were no vitals filed for this visit.  GENERAL:alert, no distress and comfortable SKIN: skin color, texture, turgor are normal, no rashes or significant lesions EYES: normal, Conjunctiva are pink and non-injected, sclera  clear OROPHARYNX:no exudate, no erythema and lips, buccal mucosa, and tongue normal  NECK: supple, thyroid normal size, non-tender, without nodularity LYMPH:  no palpable lymphadenopathy in the cervical, axillary or inguinal LUNGS: clear to auscultation and percussion with normal breathing effort HEART: regular rate & rhythm and no murmurs and no lower extremity edema ABDOMEN:abdomen soft, non-tender and normal bowel sounds MUSCULOSKELETAL:no cyanosis of digits and no clubbing  NEURO: alert & oriented x 3 with fluent speech, no focal motor/sensory deficits EXTREMITIES: No lower extremity edema  LABORATORY DATA:  I have reviewed the data as listed CMP Latest Ref Rng & Units 07/14/2017 09/26/2016 04/28/2016  Glucose 65 - 99 mg/dL 189(H) 196(H) 157(H)  BUN 6 - 20 mg/dL 22(H) 26 18  Creatinine 0.44 - 1.00 mg/dL 1.40(H) 1.17(H) 1.17(H)  Sodium 135 - 145 mmol/L 139 139 138  Potassium 3.5 - 5.1 mmol/L 4.4 4.4 4.1  Chloride 101 - 111 mmol/L 105 101 102  CO2 22 - 32 mmol/L '23 24 27  '$ Calcium 8.9 -  10.3 mg/dL 9.5 9.1 9.5  Total Protein 6.0 - 8.5 g/dL - 6.8 7.0  Total Bilirubin 0.0 - 1.2 mg/dL - 0.3 0.6  Alkaline Phos 39 - 117 IU/L - 66 62  AST 0 - 40 IU/L - 19 15  ALT 0 - 32 IU/L - 11 9    Lab Results  Component Value Date   WBC 8.9 07/14/2017   HGB 10.9 (L) 07/14/2017   HCT 34.6 (L) 07/14/2017   MCV 81.4 07/14/2017   PLT 297 07/14/2017   NEUTROABS 4,697 04/28/2016    ASSESSMENT & PLAN:  Malignant neoplasm of upper-outer quadrant of right breast in female, estrogen receptor positive ([facility]) 07/21/17: Rt Lumpectomy: Grade 2 IDC 1.7 cm, 0/2 LN Neg, Margins Neg, ER 95%, PR 30%, Ki-67 15%, HER-2 negative ratio 1.36 T1cN0 Stage 1A Oncotype DX recurrence score 14: Risk of distant recurrence of 9 years: 4%, low risk Adjuvant radiation therapy started 08/22/2017-09/15/2017  Treatment plan: Adjuvant antiestrogen therapy with letrozole 2.5 mg daily to start 10/01/2017  Letrozole counseling: We  discussed the risks and benefits of anti-estrogen therapy with aromatase inhibitors. These include but not limited to insomnia, hot flashes, mood changes, vaginal dryness, bone density loss, and weight gain. We strongly believe that the benefits far outweigh the risks. Patient understands these risks and consented to starting treatment. Planned treatment duration is 5-7 years.  Return to clinic in 3 months for survivorship care plan visit     I spent 25 minutes talking to the patient of which more than half was spent in counseling and coordination of care.  No orders of the defined types were placed in this encounter.  The patient has a good understanding of the overall plan. she agrees with it. she will call with any problems that may develop before the next visit here.   Harriette Ohara, MD 09/15/17

## 2017-09-15 NOTE — Assessment & Plan Note (Deleted)
07/21/17: Rt Lumpectomy: Grade 2 IDC 1.7 cm, 0/2 LN Neg, Margins Neg, ER 95%, PR 30%, Ki-67 15%, HER-2 negative ratio 1.36 T1cN0 Stage 1A Oncotype DX recurrence score 14: Risk of distant recurrence of 9 years: 4%, low risk Adjuvant radiation therapy started 08/22/2017-09/15/2017  Treatment plan: Adjuvant antiestrogen therapy with letrozole 2.5 mg daily to start 10/01/2017  Letrozole counseling: We discussed the risks and benefits of anti-estrogen therapy with aromatase inhibitors. These include but not limited to insomnia, hot flashes, mood changes, vaginal dryness, bone density loss, and weight gain. We strongly believe that the benefits far outweigh the risks. Patient understands these risks and consented to starting treatment. Planned treatment duration is 5-7 years.  Return to clinic in 3 months for survivorship care plan visit

## 2017-09-18 ENCOUNTER — Ambulatory Visit
Admission: RE | Admit: 2017-09-18 | Discharge: 2017-09-18 | Disposition: A | Discharge status: Home / Self Care | Payer: Medicare PPO | Source: Ambulatory Visit | Attending: Radiation Oncology | Admitting: Radiation Oncology

## 2017-09-18 ENCOUNTER — Encounter: Payer: Self-pay | Admitting: Radiation Oncology

## 2017-09-18 DIAGNOSIS — Z510 Encounter for antineoplastic radiation therapy: Secondary | ICD-10-CM | POA: Diagnosis not present

## 2017-09-19 ENCOUNTER — Telehealth: Payer: Self-pay | Admitting: Hematology and Oncology

## 2017-09-19 NOTE — Telephone Encounter (Signed)
Scheduled appt per 3/25 sch message - patient is aware of appt date and time.

## 2017-09-20 ENCOUNTER — Telehealth: Payer: Self-pay

## 2017-09-20 NOTE — Telephone Encounter (Signed)
Returned patient call and left a voice message to verify her appointment for 3/27 @11 :15 am. Per 3/27 am phone message return

## 2017-09-20 NOTE — Progress Notes (Signed)
  Radiation Oncology         ([phone]) [phone] ________________________________  Name: [name] MRN: [id number]  Date: 09/18/2017  DOB: [birth year]  End of Treatment Note  Diagnosis:   67 y.o. female with Stage IA, pT1cN0M0, grade 2, ER/PR positive invasive ductal carcinoma of the right breast   Indication for treatment:  Curative       Radiation treatment dates:   08/22/2017 - 09/18/2017  Site/dose:   The patient initially received a dose of 42.5 Gy in 17 fractions to the right breast using whole-breast tangent fields. This was delivered using a 3-D conformal technique. The patient then received a boost to the seroma. This delivered an additional 7.5 Gy in 3 fractions using a 3 field photon technique due to the depth of the seroma. The total dose was 50 Gy.  Narrative: The patient tolerated radiation treatment relatively well.   The patient had some expected skin irritation as she progressed during treatment. Moist desquamation was not present at the end of treatment.  Plan: The patient has completed radiation treatment. The patient will return to radiation oncology clinic for routine followup in one month. I advised the patient to call or return sooner if they have any questions or concerns related to their recovery or treatment. ________________________________  Jodelle Gross, MD, PhD  This document serves as a record of services personally performed by Kyung Rudd, MD. It was created on his behalf by Rae Lips, a trained medical scribe. The creation of this record is based on the scribe's personal observations and the provider's statements to them. This document has been checked and approved by the attending provider.

## 2017-09-21 ENCOUNTER — Telehealth: Payer: Self-pay | Admitting: Hematology and Oncology

## 2017-09-21 ENCOUNTER — Inpatient Hospital Stay (HOSPITAL_BASED_OUTPATIENT_CLINIC_OR_DEPARTMENT_OTHER): Payer: Medicare PPO | Admitting: Hematology and Oncology

## 2017-09-21 DIAGNOSIS — Z170 Estrogen receptor positive status [ER+]: Secondary | ICD-10-CM

## 2017-09-21 DIAGNOSIS — L598 Other specified disorders of the skin and subcutaneous tissue related to radiation: Secondary | ICD-10-CM | POA: Diagnosis not present

## 2017-09-21 DIAGNOSIS — Z79899 Other long term (current) drug therapy: Secondary | ICD-10-CM | POA: Diagnosis not present

## 2017-09-21 DIAGNOSIS — C50411 Malignant neoplasm of upper-outer quadrant of right female breast: Secondary | ICD-10-CM | POA: Diagnosis present

## 2017-09-21 DIAGNOSIS — Z7984 Long term (current) use of oral hypoglycemic drugs: Secondary | ICD-10-CM | POA: Diagnosis not present

## 2017-09-21 DIAGNOSIS — Z923 Personal history of irradiation: Secondary | ICD-10-CM

## 2017-09-21 DIAGNOSIS — Z7982 Long term (current) use of aspirin: Secondary | ICD-10-CM | POA: Diagnosis not present

## 2017-09-21 MED ORDER — LETROZOLE 2.5 MG PO TABS: 2.5000 mg | ORAL_TABLET | Freq: Every day | ORAL | 3 refills | Status: DC

## 2017-09-21 NOTE — Assessment & Plan Note (Signed)
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 I discussed the margins as well as lymph node surgeries. We also discussed the final staging along with previously performed ER/PR and HER-2/neu testing. Oncotype DX recurrence score 14: Risk of distant recurrence of 9 years: 4%, low risk Adjuvant radiation 08/22/2017-09/18/2017  Plan: Adjuvant antiestrogen therapy with letrozole 2.5 mg daily to start 10/05/2017  Letrozole counseling: We discussed the risks and benefits of anti-estrogen therapy with aromatase inhibitors. These include but not limited to insomnia, hot flashes, mood changes, vaginal dryness, bone density loss, and weight gain. We strongly believe that the benefits far outweigh the risks. Patient understands these risks and consented to starting treatment. Planned treatment duration is 5-7 years.  Return to clinic in 3 months for survivorship care plan visit

## 2017-09-21 NOTE — Telephone Encounter (Signed)
Gave patient AVs and calendar of upcoming July appointments.  °

## 2017-09-21 NOTE — Progress Notes (Signed)
Patient Care Team: Alfonse Spruce, FNP as PCP - General (Family Medicine) Nicholas Lose, MD as Consulting Physician (Hematology and Oncology) Kyung Rudd, MD as Consulting Physician (Radiation Oncology)  DIAGNOSIS:  Encounter Diagnosis  Name Primary?  . Malignant neoplasm of upper-outer quadrant of right breast in female, estrogen receptor positive ([place])     SUMMARY OF ONCOLOGIC HISTORY:   Malignant neoplasm of upper-outer quadrant of right breast in female, estrogen receptor positive ([place])   06/12/2017 Initial Diagnosis    Right breast biopsy 10:00: IDC grade 2, ER 95%, PR 30%, Ki-67 15%, HER-2 negative ratio 1.36, 1.7 cm mass in the right upper outer quadrant, T1CN0 stage I a clinical stage      07/21/2017 Surgery    Rt Lumpectomy: Grade 2 IDC 1.7 cm, 0/2 LN Neg, Margins Neg, ER 95%, PR 30%, Ki-67 15%, HER-2 negative ratio 1.36 T1cN0 Stage 1A      08/22/2017 - 09/15/2017 Radiation Therapy    Adjuvant radiation therapy      08/23/2017 Oncotype testing    Oncotype DX recurrence score 14: Risk of distant recurrence of 9 years: 4%, low risk       CHIEF COMPLIANT: Follow-up after radiation therapy to discuss antiestrogen therapy  INTERVAL HISTORY: [name] is a 67 year old with above-mentioned history of right breast cancer treated with lumpectomy radiation and is currently here to discuss starting antiestrogen therapy.  She is still healing from the effects of recent radiation.  She has a spot underneath her right breast which appears to be related to a drain that he had originally.  She contacted surgeon's office and they instructed her to apply an ointment.  Apart from that and radiation dermatitis she appears to be doing quite well.  REVIEW OF SYSTEMS:   Constitutional: Denies fevers, chills or abnormal weight loss Eyes: Denies blurriness of vision Ears, nose, mouth, throat, and face: Denies mucositis or sore throat Respiratory: Denies cough, dyspnea or  wheezes Cardiovascular: Denies palpitation, chest discomfort Gastrointestinal:  Denies nausea, heartburn or change in bowel habits Skin: Denies abnormal skin rashes Lymphatics: Denies new lymphadenopathy or easy bruising Neurological:Denies numbness, tingling or new weaknesses Behavioral/Psych: Mood is stable, no new changes  Extremities: No lower extremity edema Breast: Radiation dermatitis and sore spot in the right breast All other systems were reviewed with the patient and are negative.  I have reviewed the past medical history, past surgical history, social history and family history with the patient and they are unchanged from previous note.  ALLERGIES:  has No Known Allergies.  MEDICATIONS:  Current Outpatient Medications  Medication Sig Dispense Refill  . ACCU-CHEK SOFTCLIX LANCETS lancets Use as instructed 100 each 12  . albuterol (PROVENTIL HFA;VENTOLIN HFA) 108 (90 Base) MCG/ACT inhaler Inhale 2 puffs into the lungs every 6 (six) hours as needed for wheezing or shortness of breath. 1 Inhaler 2  . amLODipine (NORVASC) 10 MG tablet Take 1 tablet (10 mg total) by mouth daily. 30 tablet 0  . aspirin EC 81 MG tablet Take 1 tablet (81 mg total) by mouth daily. 30 tablet 5  . atorvastatin (LIPITOR) 40 MG tablet Take 1 tablet (40 mg total) by mouth daily. 30 tablet 0  . Blood Glucose Monitoring Suppl (ACCU-CHEK AVIVA PLUS) w/Device KIT Use as directed 1 kit 0  . buPROPion (WELLBUTRIN XL) 150 MG 24 hr tablet Take 1 tablet (150 mg total) by mouth daily. 30 tablet 0  . carvedilol (COREG) 6.25 MG tablet Take 1 tablet (6.25 mg  total) by mouth 2 (two) times daily with a meal. 60 tablet 0  . celecoxib (CELEBREX) 200 MG capsule Take 1 capsule (200 mg total) by mouth 2 (two) times daily as needed. (Patient taking differently: Take 200 mg by mouth 2 (two) times daily as needed for mild pain. ) 60 capsule 3  . diclofenac sodium (VOLTAREN) 1 % GEL Apply 2 g topically 4 (four) times daily as needed.  (Patient not taking: Reported on 08/16/2017) 1 Tube 1  . docusate sodium (COLACE) 100 MG capsule Take 1 capsule (100 mg total) by mouth 2 (two) times daily. 60 capsule 0  . glipiZIDE (GLUCOTROL) 5 MG tablet Take 1 tablet (5 mg total) by mouth daily before breakfast. 30 tablet 0  . glucose blood (ACCU-CHEK AVIVA PLUS) test strip Use as instructed 100 each 12  . hydrochlorothiazide (HYDRODIURIL) 25 MG tablet Take 1 tablet (25 mg total) by mouth daily. 30 tablet 0  . ibuprofen (ADVIL,MOTRIN) 800 MG tablet TAKE 1 TABLET BY MOUTH EVERY 8 HOURS AS NEEDED FOR MODERATE PAIN OR CRAMPING. TAKE WITH FOOD. (Patient taking differently: Take 800 mg by mouth every 8 (eight) hours as needed. FOR MODERATE PAIN OR CRAMPING. TAKE WITH FOOD.) 30 tablet 0  . linagliptin (TRADJENTA) 5 MG TABS tablet Take 5 mg by mouth daily.    [name] losartan (COZAAR) 100 MG tablet Take 1 tablet (100 mg total) by mouth daily. 90 tablet 1  . metFORMIN (GLUCOPHAGE) 1000 MG tablet Take 1 tablet (1,000 mg total) by mouth 2 (two) times daily with a meal. 60 tablet 0  . Multiple Vitamins-Minerals (CENTRUM SILVER 50+WOMEN) TABS Take 1 tablet by mouth daily.    [name] oxyCODONE (OXY IR/ROXICODONE) 5 MG immediate release tablet Take 1 tablet (5 mg total) by mouth every 6 (six) hours as needed for severe pain. 20 tablet 0  . pantoprazole (PROTONIX) 40 MG tablet Take 1 tablet (40 mg total) by mouth daily. 30 tablet 0  . polyethylene glycol (MIRALAX / GLYCOLAX) packet Take 17 g by mouth daily as needed for mild constipation or moderate constipation. 30 each 2  . traZODone (DESYREL) 50 MG tablet Take 1 tablet (50 mg total) by mouth at bedtime as needed for sleep. (Patient taking differently: Take 50 mg by mouth at bedtime. ) 30 tablet 0   No current facility-administered medications for this visit.     PHYSICAL EXAMINATION: ECOG PERFORMANCE STATUS: 1 - Symptomatic but completely ambulatory  Vitals:   09/21/17 1127  BP: (!) 143/70  Pulse: 85  Resp: 17    Temp: 98.2 F (36.8 C)  SpO2: 100%   Filed Weights   09/21/17 1127  Weight: 246 lb 6.4 oz (111.8 kg)    GENERAL:alert, no distress and comfortable SKIN: skin color, texture, turgor are normal, no rashes or significant lesions EYES: normal, Conjunctiva are pink and non-injected, sclera clear OROPHARYNX:no exudate, no erythema and lips, buccal mucosa, and tongue normal  NECK: supple, thyroid normal size, non-tender, without nodularity LYMPH:  no palpable lymphadenopathy in the cervical, axillary or inguinal LUNGS: clear to auscultation and percussion with normal breathing effort HEART: regular rate & rhythm and no murmurs and no lower extremity edema ABDOMEN:abdomen soft, non-tender and normal bowel sounds MUSCULOSKELETAL:no cyanosis of digits and no clubbing  NEURO: alert & oriented x 3 with fluent speech, no focal motor/sensory deficits EXTREMITIES: No lower extremity edema  LABORATORY DATA:  I have reviewed the data as listed CMP Latest Ref Rng & Units 07/14/2017 09/26/2016 04/28/2016  Glucose 65 - 99 mg/dL 189(H) 196(H) 157(H)  BUN 6 - 20 mg/dL 22(H) 26 18  Creatinine 0.44 - 1.00 mg/dL 1.40(H) 1.17(H) 1.17(H)  Sodium 135 - 145 mmol/L 139 139 138  Potassium 3.5 - 5.1 mmol/L 4.4 4.4 4.1  Chloride 101 - 111 mmol/L 105 101 102  CO2 22 - 32 mmol/L _0 Calcium 8.9 - 10.3 mg/dL 9.5 9.1 9.5  Total Protein 6.0 - 8.5 g/dL - 6.8 7.0  Total Bilirubin 0.0 - 1.2 mg/dL - 0.3 0.6  Alkaline Phos 39 - 117 IU/L - 66 62  AST 0 - 40 IU/L - 19 15  ALT 0 - 32 IU/L - 11 9    Lab Results  Component Value Date   WBC 8.9 07/14/2017   HGB 10.9 (L) 07/14/2017   HCT 34.6 (L) 07/14/2017   MCV 81.4 07/14/2017   PLT 297 07/14/2017   NEUTROABS 4,697 04/28/2016    ASSESSMENT & PLAN:  Malignant neoplasm of upper-outer quadrant of right breast in female, estrogen receptor positive ([place]) 07/21/17: Rt Lumpectomy: Grade 2 IDC 1.7 cm, 0/2 LN Neg, Margins Neg, ER 95%, PR 30%, Ki-67 15%, HER-2 negative  ratio 1.36 T1cN0 Stage 1A  Pathology counseling: I discussed the final pathology report of the patient provided  a copy of this report. I discussed the margins as well as lymph node surgeries. We also discussed the final staging along with previously performed ER/PR and HER-2/neu testing. Oncotype DX recurrence score 14: Risk of distant recurrence of 9 years: 4%, low risk Adjuvant radiation 08/22/2017-09/18/2017  Plan: Adjuvant antiestrogen therapy with letrozole 2.5 mg daily to start 10/19/2017  Letrozole counseling: We discussed the risks and benefits of anti-estrogen therapy with aromatase inhibitors. These include but not limited to insomnia, hot flashes, mood changes, vaginal dryness, bone density loss, and weight gain. We strongly believe that the benefits far outweigh the risks. Patient understands these risks and consented to starting treatment. Planned treatment duration is 5-7 years.  Return to clinic in 4 months for survivorship care plan visit   No orders of the defined types were placed in this encounter.  The patient has a good understanding of the overall plan. she agrees with it. she will call with any problems that may develop before the next visit here.   Harriette Ohara, MD 09/21/17

## 2017-09-25 ENCOUNTER — Ambulatory Visit: Payer: Self-pay | Admitting: Genetic Counselor

## 2017-09-25 ENCOUNTER — Encounter: Payer: Self-pay | Admitting: Genetic Counselor

## 2017-09-25 DIAGNOSIS — Z1379 Encounter for other screening for genetic and chromosomal anomalies: Secondary | ICD-10-CM

## 2017-09-25 HISTORY — DX: Encounter for other screening for genetic and chromosomal anomalies: Z13.79

## 2017-09-25 NOTE — Progress Notes (Signed)
Cancer Genetics Clinic       Genetic Test Results    Patient Name: [name] Patient DOB: 08-16-1950 Patient Age: 67 y.o. Encounter Date: 09/25/2017  Referring Provider: Nicholas Lose, MD  Primary Care Provider: Alfonse Spruce, FNP   Ms. [name] was called today to discuss genetic test results. Please see the Genetics note from her visit on 09/07/2017 for a detailed discussion of her personal and family history.  Genetic Testing: At the time of Ms. [name] visit, she decided to pursue genetic testing of multiple genes associated with hereditary susceptibility to cancer. Testing included sequencing and deletion/duplication analysis. Testing did not reveal a pathogenic mutation in any of the genes analyzed.  A copy of the genetic test report will be scanned into Epic under the Media tab.  The genes analyzed were the 83 genes on [facility]'s Multi-Cancer panel (ALK, APC, ATM, AXIN2, BAP1, BARD1, BLM, BMPR1A, BRCA1, BRCA2, BRIP1, CASR, CDC73, CDH1, CDK4, CDKN1B, CDKN1C, CDKN2A, CEBPA, CHEK2, CTNNA1, DICER1, DIS3L2, EGFR, EPCAM, FH, FLCN, GATA2, GPC3, GREM1, HOXB13, HRAS, KIT, MAX, MEN1, MET, MITF, MLH1, MSH2, MSH3, MSH6, MUTYH, NBN, NF1, NF2, NTHL1, PALB2, PDGFRA, PHOX2B, PMS2, POLD1, POLE, POT1, PRKAR1A, PTCH1, PTEN, RAD50, RAD51C, RAD51D, RB1, RECQL4, RET, RUNX1, SDHA, SDHAF2, SDHB, SDHC, SDHD, SMAD4, SMARCA4, SMARCB1, SMARCE1, STK11, SUFU, TERC, TERT, TMEM127, TP53, TSC1, TSC2, VHL, WRN, WT1).  Since the current test is not perfect, it is possible that there may be a gene mutation that current testing cannot detect, but that chance is small. It is possible that a different genetic factor, which has not yet been discovered or is not on this panel, is responsible for the cancer diagnoses in the family. Again, the likelihood of this is low. No additional testing is recommended at this time for Ms. [name].  Cancer Screening: These results suggest that Ms.  [name] cancer was most likely not due to an inherited predisposition. Most cancers happen by chance and this test, along with details of her family history, suggests that her cancer falls into this category. She is recommended to follow the cancer screening guidelines provided by her physician.   Family Members: Family members are at some increased risk of developing cancer, over the general population risk, simply due to the family history. Women are recommended to have a yearly mammogram beginning at age 31, a yearly clinical breast exam, a yearly gynecologic exam and perform monthly breast self-exams. Colon cancer screening is recommended to begin by age 35 in both men and women, unless there is a family history of colon cancer or colon polyps or an individual has a personal history to warrant initiating screening at a younger age.  Any relative who had cancer at a young age or had a particularly rare cancer may also wish to pursue genetic testing. Genetic counselors can be located in other cities, by visiting the website of the [employer] ([email or web address]) and Field seismologist for a Dietitian by zip code.   Lastly, cancer genetics is a rapidly advancing field and it is possible that new genetic tests will be appropriate for Ms. [name] in the future. We encourage her to remain in contact with [place] on an annual basis so we can update her personal and family histories, and let her know of advances in cancer genetics that may benefit the family. Our contact number was provided. Ms. [name] is welcome to call anytime with additional  questions.     Steele Berg, MS, [place] Certified Genetic Counselor phone: [phone]

## 2017-10-03 ENCOUNTER — Other Ambulatory Visit: Payer: Self-pay

## 2017-10-03 DIAGNOSIS — I10 Essential (primary) hypertension: Secondary | ICD-10-CM

## 2017-10-03 DIAGNOSIS — E1165 Type 2 diabetes mellitus with hyperglycemia: Secondary | ICD-10-CM

## 2017-10-03 NOTE — Telephone Encounter (Signed)
JA 

## 2017-10-05 MED ORDER — GLIPIZIDE 5 MG PO TABS: 5.0000 mg | ORAL_TABLET | Freq: Every day | ORAL | 0 refills | Status: DC

## 2017-10-05 MED ORDER — CARVEDILOL 6.25 MG PO TABS: 6.2500 mg | ORAL_TABLET | Freq: Two times a day (BID) | ORAL | 0 refills | Status: DC

## 2017-10-05 MED ORDER — PANTOPRAZOLE SODIUM 40 MG PO TBEC: 40.0000 mg | DELAYED_RELEASE_TABLET | Freq: Every day | ORAL | 0 refills | Status: DC

## 2017-10-05 NOTE — Addendum Note (Signed)
Addended by: Rica Mast on: 10/05/2017 09:22 AM   Modules accepted: Orders

## 2017-10-05 NOTE — Telephone Encounter (Signed)
refilled 

## 2017-10-06 ENCOUNTER — Other Ambulatory Visit: Payer: Self-pay

## 2017-10-09 ENCOUNTER — Other Ambulatory Visit: Payer: Self-pay

## 2017-10-11 ENCOUNTER — Other Ambulatory Visit: Payer: Self-pay | Admitting: Internal Medicine

## 2017-10-11 DIAGNOSIS — Z760 Encounter for issue of repeat prescription: Secondary | ICD-10-CM

## 2017-10-11 MED ORDER — TRAZODONE HCL 50 MG PO TABS: 50.0000 mg | ORAL_TABLET | Freq: Every evening | ORAL | 0 refills | Status: DC | PRN

## 2017-10-16 ENCOUNTER — Telehealth: Payer: Self-pay | Admitting: *Deleted

## 2017-10-16 ENCOUNTER — Encounter: Payer: Self-pay | Admitting: Radiation Oncology

## 2017-10-16 ENCOUNTER — Other Ambulatory Visit: Payer: Self-pay

## 2017-10-16 ENCOUNTER — Ambulatory Visit
Admission: RE | Admit: 2017-10-16 | Discharge: 2017-10-16 | Disposition: A | Discharge status: Home / Self Care | Payer: Medicare PPO | Source: Ambulatory Visit | Attending: Radiation Oncology | Admitting: Radiation Oncology

## 2017-10-16 VITALS — BP 156/7 | HR 93 | Temp 98.8°F | Resp 20 | Ht 67.75 in | Wt 242.0 lb

## 2017-10-16 DIAGNOSIS — Z7982 Long term (current) use of aspirin: Secondary | ICD-10-CM | POA: Diagnosis not present

## 2017-10-16 DIAGNOSIS — C50911 Malignant neoplasm of unspecified site of right female breast: Secondary | ICD-10-CM | POA: Diagnosis present

## 2017-10-16 DIAGNOSIS — Z170 Estrogen receptor positive status [ER+]: Secondary | ICD-10-CM | POA: Diagnosis not present

## 2017-10-16 DIAGNOSIS — Z7984 Long term (current) use of oral hypoglycemic drugs: Secondary | ICD-10-CM | POA: Insufficient documentation

## 2017-10-16 DIAGNOSIS — Z923 Personal history of irradiation: Secondary | ICD-10-CM | POA: Diagnosis not present

## 2017-10-16 DIAGNOSIS — Z79899 Other long term (current) drug therapy: Secondary | ICD-10-CM | POA: Insufficient documentation

## 2017-10-16 DIAGNOSIS — C50411 Malignant neoplasm of upper-outer quadrant of right female breast: Secondary | ICD-10-CM

## 2017-10-16 NOTE — Telephone Encounter (Signed)
CALLED PATIENT TO INFORM OF PT APPT. FOR 10-19-17- ARRIVAL TIME - 11 AM @ [facility], SPOKE WITH PATIENT AND SHE IS AWARE OF THIS APPT.

## 2017-10-16 NOTE — Progress Notes (Signed)
Radiation Oncology         [phone] ________________________________  Name: [name] MRN: [id number]  Date of Service: 10/16/2017  DOB: [birth year]  Post Treatment Note  CC: Alfonse Spruce, FNP  Nicholas Lose, MD  Diagnosis:   Stage IA,pT1cN0M0, grade 2, ER/PR positive invasive ductal carcinoma of the right breast   Interval Since Last Radiation: 4 weeks   08/22/2017 - 09/18/2017: The patient initially received a dose of 42.5 Gy in 17 fractions to the right breast using whole-breast tangent fields. This was delivered using a 3-D conformal technique. The patient then received a boost to the seroma. This delivered an additional 7.5 Gy in 3 fractions using a 3 field photon technique due to the depth of the seroma. The total dose was 50 Gy.   Narrative:  The patient returns today for routine follow-up. During treatment she did very well with radiotherapy and did not have significant desquamation.                             On review of systems, the patient states she's doing okay overall. She has had some fullness in her right breast and some soreness and shooting pains in her right axilla and breast. No other complaints are noted.  ALLERGIES:  has No Known Allergies.  Meds: Current Outpatient Medications  Medication Sig Dispense Refill  . ACCU-CHEK SOFTCLIX LANCETS lancets Use as instructed 100 each 12  . albuterol (PROVENTIL HFA;VENTOLIN HFA) 108 (90 Base) MCG/ACT inhaler Inhale 2 puffs into the lungs every 6 (six) hours as needed for wheezing or shortness of breath. 1 Inhaler 2  . amLODipine (NORVASC) 10 MG tablet Take 1 tablet (10 mg total) by mouth daily. 30 tablet 0  . aspirin EC 81 MG tablet Take 1 tablet (81 mg total) by mouth daily. 30 tablet 5  . atorvastatin (LIPITOR) 40 MG tablet Take 1 tablet (40 mg total) by mouth daily. 30 tablet 0  . Blood Glucose Monitoring Suppl (ACCU-CHEK AVIVA PLUS) w/Device KIT Use as directed 1 kit 0  . buPROPion (WELLBUTRIN XL)  150 MG 24 hr tablet Take 1 tablet (150 mg total) by mouth daily. 30 tablet 0  . celecoxib (CELEBREX) 200 MG capsule Take 1 capsule (200 mg total) by mouth 2 (two) times daily as needed. (Patient taking differently: Take 200 mg by mouth 2 (two) times daily as needed for mild pain. ) 60 capsule 3  . diclofenac sodium (VOLTAREN) 1 % GEL Apply 2 g topically 4 (four) times daily as needed. 1 Tube 1  . glucose blood (ACCU-CHEK AVIVA PLUS) test strip Use as instructed 100 each 12  . hydrochlorothiazide (HYDRODIURIL) 25 MG tablet Take 1 tablet (25 mg total) by mouth daily. 30 tablet 0  . linagliptin (TRADJENTA) 5 MG TABS tablet Take 5 mg by mouth daily.    [name] losartan (COZAAR) 100 MG tablet Take 1 tablet (100 mg total) by mouth daily. 90 tablet 1  . metFORMIN (GLUCOPHAGE) 1000 MG tablet Take 1 tablet (1,000 mg total) by mouth 2 (two) times daily with a meal. 60 tablet 0  . polyethylene glycol (MIRALAX / GLYCOLAX) packet Take 17 g by mouth daily as needed for mild constipation or moderate constipation. 30 each 2  . traZODone (DESYREL) 50 MG tablet Take 1 tablet (50 mg total) by mouth at bedtime as needed for sleep. 30 tablet 0  . carvedilol (COREG) 6.25 MG tablet Take 1  tablet (6.25 mg total) by mouth 2 (two) times daily with a meal. (Patient not taking: Reported on 10/16/2017) 60 tablet 0  . glipiZIDE (GLUCOTROL) 5 MG tablet Take 1 tablet (5 mg total) by mouth daily before breakfast. (Patient not taking: Reported on 10/16/2017) 30 tablet 0  . ibuprofen (ADVIL,MOTRIN) 800 MG tablet TAKE 1 TABLET BY MOUTH EVERY 8 HOURS AS NEEDED FOR MODERATE PAIN OR CRAMPING. TAKE WITH FOOD. (Patient not taking: Reported on 10/16/2017) 30 tablet 0  . letrozole (FEMARA) 2.5 MG tablet Take 1 tablet (2.5 mg total) by mouth daily. (Patient not taking: Reported on 10/16/2017) 90 tablet 3  . Multiple Vitamins-Minerals (CENTRUM SILVER 50+WOMEN) TABS Take 1 tablet by mouth daily.    . pantoprazole (PROTONIX) 40 MG tablet Take 1 tablet (40  mg total) by mouth daily. (Patient not taking: Reported on 10/16/2017) 30 tablet 0   No current facility-administered medications for this encounter.     Physical Findings:  height is 5' 7.75" (1.721 m) and weight is 242 lb (109.8 kg). Her oral temperature is 98.8 F (37.1 C). Her blood pressure is 156/7 (abnormal) and her pulse is 93. Her respiration is 20 and oxygen saturation is 100%.  Pain Assessment Pain Score: 7  Pain Loc: Back(Back  Right breast level 5)/10 In general this is a well appearing African American female in no acute distress. She's alert and oriented x4 and appropriate throughout the examination. Cardiopulmonary assessment is negative for acute distress and she exhibits normal effort. The right breast was examined and reveals hyperpgimentation and no desquamation. She has visible pores and edema of the right breast.   Lab Findings: Lab Results  Component Value Date   WBC 8.9 07/14/2017   HGB 10.9 (L) 07/14/2017   HCT 34.6 (L) 07/14/2017   MCV 81.4 07/14/2017   PLT 297 07/14/2017     Radiographic Findings: No results found.  Impression/Plan: 1. Stage IA,pT1cN0M0, grade 2, ER/PR positive invasive ductal carcinoma of the right breast. The patient has been doing well since completion of radiotherapy. We discussed that we would be happy to continue to follow her as needed, but she will also continue to follow up with Dr. Lindi Adie in medical oncology. She was counseled on skin care as well as measures to avoid sun exposure to this area.  2. Survivorship. We discussed the importance of survivorship evaluation and she is currently scheduled for this in the near future. She was also given the monthly calendar for access to resources offered within the cancer center. 3. Right breast edema concerning for early lymphedema. We discussed evaluation with the PT team to determine if she would benefit from stretching exercises for her right breast and axillary pain as well as for  evaluation of breast lymphedema. She is in agreement and this will be coordinated.    Carola Rhine, PAC

## 2017-10-19 ENCOUNTER — Encounter: Payer: Self-pay | Admitting: Physical Therapy

## 2017-10-19 ENCOUNTER — Other Ambulatory Visit: Payer: Self-pay

## 2017-10-19 ENCOUNTER — Ambulatory Visit: Payer: Medicare PPO | Attending: Radiation Oncology | Admitting: Physical Therapy

## 2017-10-19 DIAGNOSIS — M6281 Muscle weakness (generalized): Secondary | ICD-10-CM | POA: Insufficient documentation

## 2017-10-19 DIAGNOSIS — M25511 Pain in right shoulder: Secondary | ICD-10-CM | POA: Diagnosis present

## 2017-10-19 DIAGNOSIS — I890 Lymphedema, not elsewhere classified: Secondary | ICD-10-CM | POA: Insufficient documentation

## 2017-10-19 DIAGNOSIS — M25611 Stiffness of right shoulder, not elsewhere classified: Secondary | ICD-10-CM | POA: Diagnosis present

## 2017-10-19 NOTE — Therapy (Addendum)
[place], [place], [address] Phone: [phone]   Fax:  [phone]  Physical Therapy Evaluation  Patient Details  Name: [name] MRN: [id number] Date of Birth: 10-31-1950 Referring Provider: Hayden Pedro   Encounter Date: 10/19/2017  PT End of Session - 10/19/17 1155    Visit Number  1    Number of Visits  5    Date for PT Re-Evaluation  11/16/17    PT Start Time  1113 pt arrived late due to misprinted schedule    PT Stop Time  1145    PT Time Calculation (min)  32 min    Activity Tolerance  Patient tolerated treatment well    Behavior During Therapy  [facility] for tasks assessed/performed       Past Medical History:  Diagnosis Date  . Abscess in epidural space of L2-L5 lumbar spine 03/2006  . Anemia, iron deficiency   . Arthritis   . Breast cancer ([place]) 06/12/2017   right breast  . CAD (coronary artery disease), native coronary artery 03/30/2015  . Cataract   . Colon polyp    a. Multiple colonic polyps status post colonoscopy in October 2007, consistent with tubular adenoma, tubulovillous adenoma with no high-grade dysplasia or malignancy identified.   . Constipation   . Coronary artery disease    a. s/p CABG 06/2010: S-D2; S-PDA (at time of MV surgery).  . Depression   . Diabetes mellitus    type II  . Family history of breast cancer   . Family history of prostate cancer   . Genetic testing 09/25/2017   Multi-Cancer panel (83 genes) @ [facility] - No pathogenic mutations detected  . GERD (gastroesophageal reflux disease)   . Hx of transient ischemic attack (TIA)    a. See stroke section.  . Hyperlipidemia   . Hypertension   . Hypoxia    a. Has history of acute hypoxic respiratory failure in the setting of bronchitis/PNA or prior admissions.  [name] MRSA infection    a. History of recurrent skin infection and soft tissue abscesses, with MRSA positive in the past.  . 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 mitral valve - s/p resection and MV repair 06/2010 Dr. [name].  . 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 Sleep apnea   . Stenosis of middle cerebral artery    a. Distal R MCA.  . Stroke [facility])    a. 11/2009: mitral mass diagnosed at this time, also has distal R MCA stenosis, tx with coumadin. b. Readmitted 05/2010 with TIA symptoms - had not been taking Coumadin. s/p MV surgery 06/2010. Coumadin stopped 2013 after review of chart by Dr. [name] since mass was removed (stroke felt possibly related to this).     Past Surgical History:  Procedure Laterality Date  . BREAST LUMPECTOMY WITH RADIOACTIVE SEED AND SENTINEL LYMPH NODE BIOPSY Right 07/20/2017   Procedure: RIGHT BREAST LUMPECTOMY WITH RADIOACTIVE SEED AND SENTINEL LYMPH NODE BIOPSY;  Surgeon: Alphonsa Overall, MD;  Location: [facility];  Service: General;  Laterality: Right;  . CARDIAC CATHETERIZATION  04/03/2015   Procedure: Left Heart Cath and Cors/Grafts Angiography;  Surgeon: Peter M Martinique, MD;  Location: [facility];  Service: Cardiovascular;;  . CORONARY ARTERY BYPASS GRAFT  1/12  . history of ankle fractures requiring surgery    . LUMBAR LAMINECTOMY/DECOMPRESSION MICRODISCECTOMY Left 04/15/2015   Procedure: LUMBAR LAMINECTOMY/DECOMPRESSION MICRODISCECTOMY 1 LEVEL;  Surgeon: Newman Pies, MD;  Location: [facility]  NEURO ORS;  Service: Neurosurgery;  Laterality: Left;  Left L23 microdiskectomy  . MV repair and resection of mass  1/12  . TONSILLECTOMY  1971  . TOTAL ABDOMINAL HYSTERECTOMY  1978  . VESICOVAGINAL FISTULA CLOSURE W/ TAH      There were no vitals filed for this visit.   Subjective Assessment - 10/19/17 1119    Subjective  Pt reports she has right breast swelling and her right breast is hard. She also had pain when lifting right arm. She says the pain is a pulling pain and she has trouble reaching out and reaching up. She reports the pain is really bad.     Pertinent History  COPD,  diabetes, DDD, hx of back surgery, 04/15/15 lumbar laminectomy, right breast cancer, Rt Lumpectomy: Grade 2 IDC 1.7 cm, 0/2 LN Neg, Margins Neg, ER 95%, PR 30%, Ki-67 15%, HER-2 negative ratio 1.36 T1cN0 Stage 1A, pt completed radiation    Patient Stated Goals  to get right breast swelling down and be able to move right arm up without pain    Currently in Pain?  No/denies    Pain Score  0-No pain         [facility] PT Assessment - 10/19/17 0001      Assessment   Medical Diagnosis  right breast pain    Referring Provider  Hayden Pedro    Onset Date/Surgical Date  07/20/17    Hand Dominance  Left    Prior Therapy  therapy for back in 2018      Precautions   Precautions  Other (comment)    Precaution Comments  at risk for lymphedema      Restrictions   Weight Bearing Restrictions  No      Balance Screen   Has the patient fallen in the past 6 months  No    Has the patient had a decrease in activity level because of a fear of falling?   No    Is the patient reluctant to leave their home because of a fear of falling?   No      Home Film/video editor residence    Living Arrangements  Other (Comment);Spouse/significant other daughter    Available Help at Discharge  Family    Type of Home  Apartment    Home Access  Level entry    [place];Able to live on main level with bedroom/bathroom    Alternate Level Stairs-Number of Steps  14    Alternate Level Stairs-Rails  Right    Home Equipment  None reports she uses cane or cart for longer distances      Prior Function   Level of Independence  Independent    Vocation  Retired    Leisure  pt reports she does very very little exercise      Cognition   Overall Cognitive Status  Within Functional Limits for tasks assessed      Observation/Other Assessments   Observations  right breast 1/4 to 1/3 larger than left, pt has increased scar tissue around right lumpectomy scar      ROM / Strength    AROM / PROM / Strength  AROM      AROM   AROM Assessment Site  Shoulder    Right/Left Shoulder  Left;Right    Right Shoulder Flexion  163 Degrees    Right Shoulder ABduction  140 Degrees    Right Shoulder Internal Rotation  54 Degrees  with pain in arm    Right Shoulder External Rotation  71 Degrees    Left Shoulder Flexion  168 Degrees    Left Shoulder ABduction  169 Degrees    Left Shoulder Internal Rotation  76 Degrees    Left Shoulder External Rotation  87 Degrees        LYMPHEDEMA/ONCOLOGY QUESTIONNAIRE - 10/19/17 1136      Type   Cancer Type  right breast cancer      Surgeries   Lumpectomy Date  07/20/17    Sentinel Lymph Node Biopsy Date  07/20/17    Number Lymph Nodes Removed  2      Date Lymphedema/Swelling Started   Date  07/20/17      Treatment   Active Chemotherapy Treatment  No    Past Chemotherapy Treatment  No    Active Radiation Treatment  No    Past Radiation Treatment  Yes    Current Hormone Treatment  Yes    Drug Name  Femara    Past Hormone Therapy  No      What other symptoms do you have   Are you Having Heaviness or Tightness  Yes    Are you having Pain  Yes    Are you having pitting edema  No    Is it Hard or Difficult finding clothes that fit  No    Do you have infections  No    Is there Decreased scar mobility  Yes      Lymphedema Assessments   Lymphedema Assessments  Upper extremities      Right Upper Extremity Lymphedema   15 cm Proximal to Olecranon Process  33 cm    Olecranon Process  26 cm    15 cm Proximal to Ulnar Styloid Process  22.5 cm    Just Proximal to Ulnar Styloid Process  16 cm    Across Hand at [employer]  19.5 cm    At [place] of 2nd Digit  6.5 cm      Left Upper Extremity Lymphedema   15 cm Proximal to Olecranon Process  33 cm    Olecranon Process  27 cm    15 cm Proximal to Ulnar Styloid Process  23 cm    Just Proximal to Ulnar Styloid Process  16 cm    Across Hand at [employer]  19.5 cm    At [place]  of 2nd Digit  6.5 cm             Outpatient Re[facility] 10/19/[facility]  Lymphedema Life Impact Scale Total Score  41.18 %      Objective measurements completed on examination: See above findings.              PT Education - 10/19/17 1206    Education provided  Yes    Education Details  anatomy and physiology of lymphatic system    Person(s) Educated  Patient    Methods  Explanation    Comprehension  Verbalized understanding        PT Long Term Goals - 10/19/17 1203      PT LONG TERM GOAL #1   Title  Pt will be independent in self MLD for management of right breast lymphedema    Time  4    Period  Weeks    Status  New    [place] Date  11/16/17      PT LONG TERM GOAL #2  Title  Pt will receive a compression bra from DME supplier for management of right breast lymphedema    Time  4    Period  Weeks    Status  New    Target Date  11/16/17      PT LONG TERM GOAL #3   Title  Pt will be independent in a home exercise program for continued strengthening and stretching    Time  4    Period  Weeks    Status  New    Target Date  11/16/17      PT LONG TERM GOAL #4   Title  Pt wil report a 75% improvement in feelings of pain in RUE with RUE movement to allow increased function of RUE    Time  4    Period  Weeks    Status  New    Target Date  11/16/17      PT LONG TERM GOAL #5   Title  Pt will be able to independently verbalize lymphedema risk reduction practices    Time  4    Period  Weeks    Status  New    Target Date  11/16/17             Plan - 10/19/17 1156    Clinical Impression Statement  Pt presents to PT with decreased R shoulder ROM, R shoulder pain, and R breast lymphedema following a lumpectomy on 07/20/17 and completion of radiation for treatment of right breast cancer. Pt reports she has this pain when she reaches her arm up or out and she states it is a pulling pain and sometimes causes her  to yell out. Her right breast also demonstrates increased swelling - it is approximately 1/4 to 1/3 larger than the left breast with increased pore size and increased fibrosis especially under right lumpectomy scar. Pt would benefit from skilled PT services to decrease right breast lymphedema, improve R shoulder ROM and decrease right shoulder pain. Pt also has increased back pain of a chronic nature and would benefit from some core strengthening exercises.     History and Personal Factors relevant to plan of care:  pt has 1 car for transportation that she shares with her husband    Clinical Presentation  Stable    Clinical Decision Making  Low    Rehab Potential  Good    Clinical Impairments Affecting Rehab Potential  hx of radiation    PT Frequency  1x / week    PT Duration  4 weeks    PT Treatment/Interventions  ADLs/Self Care Home Management;Therapeutic exercise;Therapeutic activities;Patient/family education;Manual techniques;Manual lymph drainage;Compression bandaging;Scar mobilization;Passive range of motion;Taping;Vasopneumatic Device    PT Next Visit Plan  give [place] decompression and supine dowel, see if signed Rx back for compression bra, begin R breast MLD and instruct pt, she is only coming 1x/wk due to high copay    Consulted and Agree with Plan of Care  Patient       Patient will benefit from skilled therapeutic intervention in order to improve the following deficits and impairments:  Decreased range of motion, Pain, Postural dysfunction, Decreased strength, Decreased scar mobility, Decreased safety awareness, Increased edema, Increased fascial restricitons  Visit Diagnosis: Lymphedema, not elsewhere classified  Stiffness of right shoulder, not elsewhere classified  Acute pain of right shoulder  Muscle weakness (generalized)     Problem List Patient Active Problem List   Diagnosis Date Noted  . Genetic testing 09/25/2017  . Family history  of breast cancer   . Family  history of prostate cancer   . Malignant neoplasm of upper-outer quadrant of right breast in female, estrogen receptor positive ([place]) 06/26/2017  . Lumbar herniated disc 04/15/2015  . Abnormal nuclear stress test 04/03/2015  . 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 CAD (coronary artery disease), native coronary artery 03/30/2015  . COPD (chronic obstructive pulmonary disease) ([place]) 12/24/2013  . Acute respiratory failure with hypoxia ([place]) 02/16/2013  . S/P mitral valve repair 11/16/2010  . Dyslipidemia 06/29/2010  . Diabetes ([place]) 09/29/2006  . ANEMIA-IRON DEFICIENCY 09/29/2006  . Essential hypertension 09/29/2006    Allyson Sabal [facility] 10/19/2017, 12:07 PM  [place], [place], [address] Phone: [phone]   Fax:  [phone]  Name: [name] MRN: [id number] Date of Birth: [birth year]  Allyson Sabal Ragland, PT 10/19/17 12:07 PM  PHYSICAL THERAPY DISCHARGE SUMMARY  Visits from Start of Care: 1  Current functional level related to goals / functional outcomes: Unknown: The patient did not return for follow-up therapy as was planned. Patient had several sessions scheduled but did not come, despite phone calls to reach her about this.   Remaining deficits: Unknown.   Education / Equipment: Beginning instruction about lymphatics. Plan: Patient agrees to discharge.  Patient goals were not met. Patient is being discharged due to not returning since the last visit.  ?????    She did not show for several appointments that she scheduled immediately after the initial evaluation.  Serafina Royals, PT 11/13/17 4:02 PM

## 2017-10-23 ENCOUNTER — Ambulatory Visit: Payer: Medicare PPO | Admitting: Physical Therapy

## 2017-10-30 ENCOUNTER — Encounter: Payer: Medicare PPO | Admitting: Physical Therapy

## 2017-11-06 ENCOUNTER — Ambulatory Visit: Payer: Medicare PPO | Attending: Radiation Oncology | Admitting: Physical Therapy

## 2017-11-06 ENCOUNTER — Telehealth: Payer: Self-pay | Admitting: Physical Therapy

## 2017-11-07 NOTE — Telephone Encounter (Signed)
Serafina Royals, PT @TODAY @ 1:17 PM

## 2017-11-09 ENCOUNTER — Encounter: Payer: Self-pay | Admitting: Internal Medicine

## 2017-11-09 ENCOUNTER — Ambulatory Visit: Payer: Medicare PPO | Attending: Internal Medicine | Admitting: Internal Medicine

## 2017-11-09 VITALS — BP 143/82 | HR 75 | Temp 98.1°F | Resp 16 | Ht 68.0 in | Wt 244.4 lb

## 2017-11-09 DIAGNOSIS — N183 Chronic kidney disease, stage 3 unspecified: Secondary | ICD-10-CM

## 2017-11-09 DIAGNOSIS — Z952 Presence of prosthetic heart valve: Secondary | ICD-10-CM | POA: Diagnosis not present

## 2017-11-09 DIAGNOSIS — Z8673 Personal history of transient ischemic attack (TIA), and cerebral infarction without residual deficits: Secondary | ICD-10-CM | POA: Insufficient documentation

## 2017-11-09 DIAGNOSIS — M545 Low back pain, unspecified: Secondary | ICD-10-CM

## 2017-11-09 DIAGNOSIS — Z853 Personal history of malignant neoplasm of breast: Secondary | ICD-10-CM | POA: Diagnosis not present

## 2017-11-09 DIAGNOSIS — Z7984 Long term (current) use of oral hypoglycemic drugs: Secondary | ICD-10-CM | POA: Insufficient documentation

## 2017-11-09 DIAGNOSIS — I10 Essential (primary) hypertension: Secondary | ICD-10-CM

## 2017-11-09 DIAGNOSIS — Z79899 Other long term (current) drug therapy: Secondary | ICD-10-CM | POA: Insufficient documentation

## 2017-11-09 DIAGNOSIS — G8929 Other chronic pain: Secondary | ICD-10-CM | POA: Diagnosis not present

## 2017-11-09 DIAGNOSIS — Z803 Family history of malignant neoplasm of breast: Secondary | ICD-10-CM | POA: Insufficient documentation

## 2017-11-09 DIAGNOSIS — Z823 Family history of stroke: Secondary | ICD-10-CM | POA: Insufficient documentation

## 2017-11-09 DIAGNOSIS — I129 Hypertensive chronic kidney disease with stage 1 through stage 4 chronic kidney disease, or unspecified chronic kidney disease: Secondary | ICD-10-CM | POA: Diagnosis present

## 2017-11-09 DIAGNOSIS — D649 Anemia, unspecified: Secondary | ICD-10-CM | POA: Insufficient documentation

## 2017-11-09 DIAGNOSIS — R05 Cough: Secondary | ICD-10-CM | POA: Insufficient documentation

## 2017-11-09 DIAGNOSIS — Z87891 Personal history of nicotine dependence: Secondary | ICD-10-CM | POA: Insufficient documentation

## 2017-11-09 DIAGNOSIS — E1165 Type 2 diabetes mellitus with hyperglycemia: Secondary | ICD-10-CM | POA: Diagnosis not present

## 2017-11-09 DIAGNOSIS — Z800 Family history of malignant neoplasm of digestive organs: Secondary | ICD-10-CM | POA: Insufficient documentation

## 2017-11-09 DIAGNOSIS — Z170 Estrogen receptor positive status [ER+]: Secondary | ICD-10-CM | POA: Diagnosis not present

## 2017-11-09 DIAGNOSIS — K219 Gastro-esophageal reflux disease without esophagitis: Secondary | ICD-10-CM | POA: Diagnosis not present

## 2017-11-09 DIAGNOSIS — F329 Major depressive disorder, single episode, unspecified: Secondary | ICD-10-CM | POA: Insufficient documentation

## 2017-11-09 DIAGNOSIS — Z951 Presence of aortocoronary bypass graft: Secondary | ICD-10-CM | POA: Insufficient documentation

## 2017-11-09 DIAGNOSIS — E669 Obesity, unspecified: Secondary | ICD-10-CM | POA: Insufficient documentation

## 2017-11-09 DIAGNOSIS — E1122 Type 2 diabetes mellitus with diabetic chronic kidney disease: Secondary | ICD-10-CM | POA: Insufficient documentation

## 2017-11-09 DIAGNOSIS — I2510 Atherosclerotic heart disease of native coronary artery without angina pectoris: Secondary | ICD-10-CM | POA: Insufficient documentation

## 2017-11-09 DIAGNOSIS — Z9889 Other specified postprocedural states: Secondary | ICD-10-CM | POA: Insufficient documentation

## 2017-11-09 DIAGNOSIS — Z833 Family history of diabetes mellitus: Secondary | ICD-10-CM | POA: Diagnosis not present

## 2017-11-09 DIAGNOSIS — R269 Unspecified abnormalities of gait and mobility: Secondary | ICD-10-CM | POA: Diagnosis not present

## 2017-11-09 DIAGNOSIS — Z7982 Long term (current) use of aspirin: Secondary | ICD-10-CM | POA: Insufficient documentation

## 2017-11-09 LAB — POCT GLYCOSYLATED HEMOGLOBIN (HGB A1C): HEMOGLOBIN A1C: 9.4

## 2017-11-09 LAB — GLUCOSE, POCT (MANUAL RESULT ENTRY): POC Glucose: 220 mg/dl — AB (ref 70–99)

## 2017-11-09 MED ORDER — PANTOPRAZOLE SODIUM 40 MG PO TBEC: 40.0000 mg | DELAYED_RELEASE_TABLET | Freq: Every day | ORAL | 3 refills | Status: DC

## 2017-11-09 MED ORDER — CARVEDILOL 6.25 MG PO TABS: 6.2500 mg | ORAL_TABLET | Freq: Two times a day (BID) | ORAL | 3 refills | Status: DC

## 2017-11-09 MED ORDER — ACETAMINOPHEN 500 MG PO TABS: 500.0000 mg | ORAL_TABLET | Freq: Three times a day (TID) | ORAL | 1 refills | Status: DC | PRN

## 2017-11-09 MED ORDER — GLIPIZIDE 5 MG PO TABS: 5.0000 mg | ORAL_TABLET | Freq: Every day | ORAL | 3 refills | Status: DC

## 2017-11-09 NOTE — Patient Instructions (Signed)
Please remember to schedule your eye appointment. Stop Celebrex and Ibuprofen.  Take Tylenol 500 mg instead.   Barrett Esophagus Barrett esophagus occurs when the tissue that lines the esophagus changes or becomes damaged. The esophagus is the tube that carries food from the throat to the stomach. With Barrett esophagus, the cells that line the esophagus are replaced by cells that are similar to the lining of the intestines (intestinal metaplasia). Barrett esophagus itself may not cause any symptoms. However, many people who have Barrett esophagus also have gastroesophageal reflux disease (GERD), which may cause symptoms such as heartburn. Treatment may include medicines, procedures to destroy the abnormal cells, or surgery. Over time, a few people with this condition may develop cancer of the esophagus. What are the causes? The exact cause of this condition is not known. In some cases, the condition develops from damage to the lining of the esophagus caused by GERD. GERD occurs when stomach acids flow up from the stomach into the esophagus. Frequent symptoms of GERD may cause intestinal metaplasia or cause cell changes (dysplasia). What increases the risk? The following factors may make you more likely to develop this condition:  Having GERD.  Being any of the following: ? Female. ? White (Caucasian). ? Obese. ? Older than 50.  Having a hiatal hernia.  Smoking.  What are the signs or symptoms? People with Barrett esophagus often have no symptoms. However, many people with this condition also have GERD. Symptoms of GERD may include:  Heartburn.  Difficulty swallowing.  Dry cough.  How is this diagnosed? Barrett esophagus may be diagnosed with an exam called an upper gastrointestinal endoscopy. During this exam, a thin, flexible tube (endoscope) is passed down your esophagus. The endoscope has a light and camera on the end of it. Your health care provider uses the endoscope to view the  inside of your esophagus. During the exam, several tissue samples will be removed (biopsy) from your esophagus so they can be checked for intestinal metaplasia or dysplasia. How is this treated? Treatment for this condition may include:  Medicines (proton pump inhibitors, or PPIs) to decrease or stop GERD.  Periodic endoscopic exams to make sure that cancer is not developing.  A procedure or surgery for dysplasia. This may include: ? Endoscopic removal or destruction of abnormal cells. ? Removal of part of the esophagus (esophagectomy).  Follow these instructions at home: Eating and drinking  Eat more fruits and vegetables.  Avoid fatty foods.  Eat small, frequent meals instead of large meals.  Avoid foods that cause heartburn. These foods include: ? Coffee and alcoholic drinks. ? Tomatoes and foods made with tomatoes. ? Greasy or spicy foods. ? Chocolate and peppermint. General instructions   Take over-the-counter and prescription medicines only as told by your health care provider.  Do not use any tobacco products, such as cigarettes, chewing tobacco, and e-cigarettes. If you need help quitting, ask your health care provider.  If your health care provider is treating you for GERD, make sure you follow all instructions and take medicines as directed.  Keep all follow-up visits as told by your health care provider. This is important. Contact a health care provider if:  You have heartburn or GERD symptoms.  You have difficulty swallowing. Get help right away if:  You have chest pain.  You are unable to swallow.  You vomit blood or material that looks like coffee grounds.  Your stool (feces) is bright red or dark. This information is not intended to  replace advice given to you by your health care provider. Make sure you discuss any questions you have with your health care provider. Document Released: 09/03/2003 Document Revised: 11/19/2015 Document Reviewed:  03/26/2015 Elsevier Interactive Patient Education  [employer].

## 2017-11-09 NOTE — Progress Notes (Signed)
Patient ID: [name], female    DOB: Jul 25, 1950  MRN: [id number]  CC: re-establish; Diabetes; and Hypertension   Subjective: [name] is a 67 y.o. female who presents for chronic ds management and to est with me as PCP.  Previous PCP, NP [place]on, has left the practice Her concerns today include:  Pt with hx of HTN, DM, HL, depression, chronic back pain, RT breast CA (intraductal CA, ER/PR+, lumpectomy and XRT), CKD stage 3, CVA 2011 (residual weakness on LT side), CAD s/p CABG and MVR  Out of several meds including Celebrex, Coreg, Glipizide, Pantoprazole for 1 mth.  Pt states that her mail order pharmacy tried contacting us but was told that she needed to be seen  C/o bad reflux/GI upset over past mth since being out of Protonix.  She loves eating tomato sandwiches  Chronic LBP:  Since lumbar surgery 2016 for rupture disc. Daily pain but not as bad as it was prior to surgery.  In mornings pain 10. Taking Celebrex brings it to a 5/10.  When she is out of Celebrex, she takes Ibuprofen Pain limits her ability to stand for long periods  Complains of poor balance when she first stands up the past 6 months.  She will fail at a friend's house about a week ago when she missed the slight step up in the doorway.  She denies pain in the knees.  She has screws in RT ankle previous fracture.  Has problems walking on pavements since surgery on this ankle.  -Has to use hands to push self up from chairs since CVA Has a cane but does not use it Plans to start walking in her neighborhood and will use her cane when doing so.  Also has [employer] and plans to start utilizing a gym  DM:  Out of Glucotrol x 1 mth Compliant with other oral meds Not checking BS regularly Feels BS and A1C elev today because she has been using cough drops over past 2 wks for what sounds like URI Eating habits:  She avoids white carbs but admits to drinking a lot of lemonade sweetened with sugar and loves  Snacker bars Last eye exam was about 1 yr ago by  Dr. Rutherford Limerick  HTN/CAD: out of Coreg x 1 mth Trying to do better with limiting salt in foods No CP/SOB/LE edema  CKD stage 3:  Noted on review of her chart.  Pt states she thinks she was informed of this once in the past.  Makes good urine Patient Active Problem List   Diagnosis Date Noted  . Genetic testing 09/25/2017  . Family history of breast cancer   . Family history of prostate cancer   . Malignant neoplasm of upper-outer quadrant of right breast in female, estrogen receptor positive ([place]) 06/26/2017  . Lumbar herniated disc 04/15/2015  . Abnormal nuclear stress test 04/03/2015  . 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 CAD (coronary artery disease), native coronary artery 03/30/2015  . COPD (chronic obstructive pulmonary disease) ([place]) 12/24/2013  . Acute respiratory failure with hypoxia ([place]) 02/16/2013  . S/P mitral valve repair 11/16/2010  . Dyslipidemia 06/29/2010  . Diabetes ([place]) 09/29/2006  . ANEMIA-IRON DEFICIENCY 09/29/2006  . Essential hypertension 09/29/2006     Current Outpatient Medications on File Prior to Visit  Medication Sig Dispense Refill  . ACCU-CHEK SOFTCLIX LANCETS lancets Use as instructed 100 each 12  . albuterol (PROVENTIL HFA;VENTOLIN HFA) 108 (90 Base) MCG/ACT inhaler Inhale 2 puffs into  the lungs every 6 (six) hours as needed for wheezing or shortness of breath. 1 Inhaler 2  . amLODipine (NORVASC) 10 MG tablet Take 1 tablet (10 mg total) by mouth daily. 30 tablet 0  . aspirin EC 81 MG tablet Take 1 tablet (81 mg total) by mouth daily. 30 tablet 5  . atorvastatin (LIPITOR) 40 MG tablet Take 1 tablet (40 mg total) by mouth daily. 30 tablet 0  . Blood Glucose Monitoring Suppl (ACCU-CHEK AVIVA PLUS) w/Device KIT Use as directed 1 kit 0  . buPROPion (WELLBUTRIN XL) 150 MG 24 hr tablet Take 1 tablet (150 mg total) by mouth daily. 30 tablet 0  . diclofenac sodium (VOLTAREN) 1 % GEL Apply 2 g  topically 4 (four) times daily as needed. 1 Tube 1  . glucose blood (ACCU-CHEK AVIVA PLUS) test strip Use as instructed 100 each 12  . hydrochlorothiazide (HYDRODIURIL) 25 MG tablet Take 1 tablet (25 mg total) by mouth daily. 30 tablet 0  . letrozole (FEMARA) 2.5 MG tablet Take 1 tablet (2.5 mg total) by mouth daily. (Patient not taking: Reported on 10/16/2017) 90 tablet 3  . linagliptin (TRADJENTA) 5 MG TABS tablet Take 5 mg by mouth daily.    [name] losartan (COZAAR) 100 MG tablet Take 1 tablet (100 mg total) by mouth daily. 90 tablet 1  . metFORMIN (GLUCOPHAGE) 1000 MG tablet Take 1 tablet (1,000 mg total) by mouth 2 (two) times daily with a meal. 60 tablet 0  . Multiple Vitamins-Minerals (CENTRUM SILVER 50+WOMEN) TABS Take 1 tablet by mouth daily.    . polyethylene glycol (MIRALAX / GLYCOLAX) packet Take 17 g by mouth daily as needed for mild constipation or moderate constipation. 30 each 2  . traZODone (DESYREL) 50 MG tablet Take 1 tablet (50 mg total) by mouth at bedtime as needed for sleep. 30 tablet 0   No current facility-administered medications on file prior to visit.     No Known Allergies  Social History   Socioeconomic History  . Marital status: Married    Spouse name: Not on file  . Number of children: 3  . Years of education: Not on file  . Highest education level: Not on file  Occupational History    Employer: UNEMPLOYED  Social Needs  . Financial resource strain: Not on file  . Food insecurity:    Worry: Not on file    Inability: Not on file  . Transportation needs:    Medical: Not on file    Non-medical: Not on file  Tobacco Use  . Smoking status: Former Smoker    Last attempt to quit: 10/14/2010    Years since quitting: 7.0  . Smokeless tobacco: Never Used  Substance and Sexual Activity  . Alcohol use: No  . Drug use: No  . Sexual activity: Never  Lifestyle  . Physical activity:    Days per week: Not on file    Minutes per session: Not on file  . Stress:  Not on file  Relationships  . Social connections:    Talks on phone: Not on file    Gets together: Not on file    Attends religious service: Not on file    Active member of club or organization: Not on file    Attends meetings of clubs or organizations: Not on file    Relationship status: Not on file  . Intimate partner violence:    Fear of current or ex partner: Not on file    Emotionally  abused: Not on file    Physically abused: Not on file    Forced sexual activity: Not on file  Other Topics Concern  . Not on file  Social History Narrative   Lives with husband, stay at home, uses cane occasionally, still active/ambulatory.     Family History  Problem Relation Age of Onset  . Colon cancer Mother        dx 19s; deceased 41  . Irritable bowel syndrome Mother   . Prostate cancer Father        deceased 57s  . Cirrhosis Sister        died of GI bleed associated with cirrhosis of the liver  . Stroke Brother   . Prostate cancer Brother 40       deceased 30  . Diabetes Brother        x3  . Kidney disease Brother   . Cancer Brother 28       unk. type  . Breast cancer Cousin        several maternal 1st and 2nd cousins with breast cancer    Past Surgical History:  Procedure Laterality Date  . BREAST LUMPECTOMY WITH RADIOACTIVE SEED AND SENTINEL LYMPH NODE BIOPSY Right 07/20/2017   Procedure: RIGHT BREAST LUMPECTOMY WITH RADIOACTIVE SEED AND SENTINEL LYMPH NODE BIOPSY;  Surgeon: Alphonsa Overall, MD;  Location: [facility];  Service: General;  Laterality: Right;  . CARDIAC CATHETERIZATION  04/03/2015   Procedure: Left Heart Cath and Cors/Grafts Angiography;  Surgeon: Peter M Martinique, MD;  Location: [facility];  Service: Cardiovascular;;  . CORONARY ARTERY BYPASS GRAFT  1/12  . history of ankle fractures requiring surgery    . LUMBAR LAMINECTOMY/DECOMPRESSION MICRODISCECTOMY Left 04/15/2015   Procedure: LUMBAR LAMINECTOMY/DECOMPRESSION MICRODISCECTOMY 1 LEVEL;  Surgeon: Newman Pies, MD;  Location: [facility];  Service: Neurosurgery;  Laterality: Left;  Left L23 microdiskectomy  . MV repair and resection of mass  1/12  . TONSILLECTOMY  1971  . TOTAL ABDOMINAL HYSTERECTOMY  1978  . VESICOVAGINAL FISTULA CLOSURE W/ TAH      ROS: Review of Systems MSK:  Lower RT thigh larger than LT.  Has been this way for over 10 yrs.  States she has mentioned it in the past to other providers.  No pain and does not appear to be inc in size PHYSICAL EXAM: BP (!) 143/82   Pulse 75   Temp 98.1 F (36.7 C) (Oral)   Resp 16   Ht '5\' 8"'$  (1.727 m)   Wt 244 lb 6.4 oz (110.9 kg)   SpO2 100%   BMI 37.16 kg/m   Wt Readings from Last 3 Encounters:  11/09/17 244 lb 6.4 oz (110.9 kg)  10/16/17 242 lb (109.8 kg)  09/21/17 246 lb 6.4 oz (111.8 kg)    Physical Exam  General appearance - alert, well appearing, obese older AAF and in no distress Mental status - normal mood, behavior, speech, dress, motor activity, and thought processes Eyes - pupils equal and reactive, extraocular eye movements intact Neck - supple, no significant adenopathy Chest - clear to auscultation, no wheezes, rales or rhonchi, symmetric air entry Heart - normal rate, regular rhythm, normal S1, S2, no murmurs, rubs, clicks or gallops Musculoskeletal - pushes up from chair with both hands.  Get up and go >12 secs.  Gait with dec feet to floor clearance. Slow walking pace. Slight valgum def at the knees. Power: Grip: 5/5 BL. LUE 4+/5, RUE 5/5.  RLE 5/5,  LLE 4+/5 prox Extremities - peripheral pulses normal, no pedal edema, no clubbing or cyanosis Diabetic Foot Exam - Simple   Simple Foot Form Visual Inspection No deformities, no ulcerations, no other skin breakdown bilaterally:  Yes Sensation Testing Intact to touch and monofilament testing bilaterally:  Yes Pulse Check Posterior Tibialis and Dorsalis pulse intact bilaterally:  Yes Comments        Chemistry      Component Value Date/Time   NA 139  07/14/2017 0930   NA 139 09/26/2016 1715   K 4.4 07/14/2017 0930   CL 105 07/14/2017 0930   CO2 23 07/14/2017 0930   BUN 22 (H) 07/14/2017 0930   BUN 26 09/26/2016 1715   CREATININE 1.40 (H) 07/14/2017 0930   CREATININE 1.17 (H) 04/28/2016 1028      Component Value Date/Time   CALCIUM 9.5 07/14/2017 0930   ALKPHOS 66 09/26/2016 1715   AST 19 09/26/2016 1715   ALT 11 09/26/2016 1715   BILITOT 0.3 09/26/2016 1715     Lab Results  Component Value Date   WBC 8.9 07/14/2017   HGB 10.9 (L) 07/14/2017   HCT 34.6 (L) 07/14/2017   MCV 81.4 07/14/2017   PLT 297 07/14/2017     ASSESSMENT AND PLAN: 1. Essential hypertension Not at goal. RF Coreg Continue Norvasc and Losartan Encourage DASH - carvedilol (COREG) 6.25 MG tablet; Take 1 tablet (6.25 mg total) by mouth 2 (two) times daily with a meal.  Dispense: 180 tablet; Refill: 3 - CBC - Comprehensive metabolic panel  2. Uncontrolled type 2 diabetes mellitus with hyperglycemia ([place]) Encouraged healthy eating habits.  Pt set goal to d/c drinking sugary drinks and snack on healthy items, not chocolate bars -pt will schedule her eye exam with Dr. Wyvonnia Dusky RF Glucotrol - POCT glucose (manual entry) - POCT glycosylated hemoglobin (Hb A1C) - glipiZIDE (GLUCOTROL) 5 MG tablet; Take 1 tablet (5 mg total) by mouth daily before breakfast.  Dispense: 90 tablet; Refill: 3 - Lipid panel  3. Gait disturbance Rec RF for P.T but pt declined because she can not afford her co-pay of $40.  However, she plans to join a gym and try to seek out a personal trainer to work with her.  I recommend use of her cane   4. CKD (chronic kidney disease) stage 3, GFR 30-59 ml/min ([facility]) Rec d/c Ibuprofen and Celebrex.  Use Tylenol PRN instead Check BMP today.   she is on Metformin  5. Anemia, unspecified type - Iron, TIBC and Ferritin Panel  6. Gastroesophageal reflux disease without esophagitis GERD precautions discussed including foods to avoid, trying to  eat last meal 2-3 hrs before bedtime and sleeping with head of bed elev - pantoprazole (PROTONIX) 40 MG tablet; Take 1 tablet (40 mg total) by mouth daily.  Dispense: 90 tablet; Refill: 3  7. Obesity (BMI 35.0-39.9 without comorbidity) See #2 above  8. Chronic midline low back pain without sciatica - acetaminophen (TYLENOL) 500 MG tablet; Take 1 tablet (500 mg total) by mouth every 8 (eight) hours as needed.  Dispense: 100 tablet; Refill: 1   Patient was given the opportunity to ask questions.  Patient verbalized understanding of the plan and was able to repeat key elements of the plan.   Orders Placed This Encounter  Procedures  . CBC  . Comprehensive metabolic panel  . Iron, TIBC and Ferritin Panel  . Lipid panel  . POCT glucose (manual entry)  . POCT glycosylated hemoglobin (Hb A1C)  Requested Prescriptions   Signed Prescriptions Disp Refills  . carvedilol (COREG) 6.25 MG tablet 180 tablet 3    Sig: Take 1 tablet (6.25 mg total) by mouth 2 (two) times daily with a meal.  . glipiZIDE (GLUCOTROL) 5 MG tablet 90 tablet 3    Sig: Take 1 tablet (5 mg total) by mouth daily before breakfast.  . pantoprazole (PROTONIX) 40 MG tablet 90 tablet 3    Sig: Take 1 tablet (40 mg total) by mouth daily.  [name] acetaminophen (TYLENOL) 500 MG tablet 100 tablet 1    Sig: Take 1 tablet (500 mg total) by mouth every 8 (eight) hours as needed.    Return in about 2 months (around 01/09/2018).  Karle Plumber, MD, FACP

## 2017-11-10 LAB — COMPREHENSIVE METABOLIC PANEL
A/G RATIO: 1.7 (ref 1.2–2.2)
ALBUMIN: 4 g/dL (ref 3.6–4.8)
ALT: 13 IU/L (ref 0–32)
AST: 14 IU/L (ref 0–40)
Alkaline Phosphatase: 70 IU/L (ref 39–117)
BUN/Creatinine Ratio: 11 — ABNORMAL LOW (ref 12–28)
BUN: 15 mg/dL (ref 8–27)
Bilirubin Total: 0.4 mg/dL (ref 0.0–1.2)
CALCIUM: 9.6 mg/dL (ref 8.7–10.3)
CO2: 22 mmol/L (ref 20–29)
Chloride: 100 mmol/L (ref 96–106)
Creatinine, Ser: 1.42 mg/dL — ABNORMAL HIGH (ref 0.57–1.00)
GFR, EST AFRICAN AMERICAN: 44 mL/min/{1.73_m2} — AB (ref >59)
GFR, EST NON AFRICAN AMERICAN: 38 mL/min/{1.73_m2} — AB (ref >59)
GLOBULIN, TOTAL: 2.4 g/dL (ref 1.5–4.5)
Glucose: 196 mg/dL — ABNORMAL HIGH (ref 65–99)
POTASSIUM: 4.8 mmol/L (ref 3.5–5.2)
SODIUM: 138 mmol/L (ref 134–144)
TOTAL PROTEIN: 6.4 g/dL (ref 6.0–8.5)

## 2017-11-10 LAB — LIPID PANEL
CHOL/HDL RATIO: 2.5 ratio (ref 0.0–4.4)
Cholesterol, Total: 223 mg/dL — ABNORMAL HIGH (ref 100–199)
HDL: 90 mg/dL (ref >39)
LDL CALC: 109 mg/dL — AB (ref 0–99)
TRIGLYCERIDES: 121 mg/dL (ref 0–149)
VLDL Cholesterol Cal: 24 mg/dL (ref 5–40)

## 2017-11-10 LAB — IRON,TIBC AND FERRITIN PANEL
Ferritin: 37 ng/mL (ref 15–150)
IRON SATURATION: 14 % — AB (ref 15–55)
IRON: 47 ug/dL (ref 27–139)
Total Iron Binding Capacity: 325 ug/dL (ref 250–450)
UIBC: 278 ug/dL (ref 118–369)

## 2017-11-10 LAB — CBC
HEMOGLOBIN: 11.4 g/dL (ref 11.1–15.9)
Hematocrit: 35.4 % (ref 34.0–46.6)
MCH: 25.9 pg — ABNORMAL LOW (ref 26.6–33.0)
MCHC: 32.2 g/dL (ref 31.5–35.7)
MCV: 80 fL (ref 79–97)
Platelets: 223 10*3/uL (ref 150–379)
RBC: 4.41 x10E6/uL (ref 3.77–5.28)
RDW: 15.4 % (ref 12.3–15.4)
WBC: 5.1 10*3/uL (ref 3.4–10.8)

## 2017-11-12 ENCOUNTER — Other Ambulatory Visit: Payer: Self-pay | Admitting: Internal Medicine

## 2017-11-12 DIAGNOSIS — E782 Mixed hyperlipidemia: Secondary | ICD-10-CM

## 2017-11-12 MED ORDER — ATORVASTATIN CALCIUM 40 MG PO TABS: 40.0000 mg | ORAL_TABLET | Freq: Every day | ORAL | 2 refills | Status: DC

## 2017-11-12 MED ORDER — FERROUS SULFATE 325 (65 FE) MG PO TABS: 325.0000 mg | ORAL_TABLET | Freq: Every day | ORAL | 0 refills | Status: DC

## 2017-11-13 ENCOUNTER — Telehealth: Payer: Self-pay

## 2017-11-13 ENCOUNTER — Ambulatory Visit: Payer: Medicare PPO | Admitting: Physical Therapy

## 2017-11-13 NOTE — Telephone Encounter (Signed)
Contacted pt to go over lab results pt is aware and doesn't have any questions or concerns 

## 2017-12-10 ENCOUNTER — Encounter (HOSPITAL_COMMUNITY): Payer: Self-pay | Admitting: Emergency Medicine

## 2017-12-10 ENCOUNTER — Emergency Department (HOSPITAL_COMMUNITY): Payer: Medicare PPO

## 2017-12-10 ENCOUNTER — Emergency Department (HOSPITAL_COMMUNITY)
Admission: EM | Admit: 2017-12-10 | Discharge: 2017-12-10 | Disposition: A | Discharge status: Home / Self Care | Payer: Medicare PPO | Attending: Emergency Medicine | Admitting: Emergency Medicine

## 2017-12-10 DIAGNOSIS — Z7984 Long term (current) use of oral hypoglycemic drugs: Secondary | ICD-10-CM | POA: Insufficient documentation

## 2017-12-10 DIAGNOSIS — I2510 Atherosclerotic heart disease of native coronary artery without angina pectoris: Secondary | ICD-10-CM | POA: Diagnosis not present

## 2017-12-10 DIAGNOSIS — Z79899 Other long term (current) drug therapy: Secondary | ICD-10-CM | POA: Diagnosis not present

## 2017-12-10 DIAGNOSIS — I10 Essential (primary) hypertension: Secondary | ICD-10-CM | POA: Insufficient documentation

## 2017-12-10 DIAGNOSIS — R05 Cough: Secondary | ICD-10-CM | POA: Diagnosis present

## 2017-12-10 DIAGNOSIS — Z951 Presence of aortocoronary bypass graft: Secondary | ICD-10-CM | POA: Diagnosis not present

## 2017-12-10 DIAGNOSIS — Z7982 Long term (current) use of aspirin: Secondary | ICD-10-CM | POA: Diagnosis not present

## 2017-12-10 DIAGNOSIS — J441 Chronic obstructive pulmonary disease with (acute) exacerbation: Secondary | ICD-10-CM | POA: Insufficient documentation

## 2017-12-10 DIAGNOSIS — Z87891 Personal history of nicotine dependence: Secondary | ICD-10-CM | POA: Diagnosis not present

## 2017-12-10 DIAGNOSIS — E119 Type 2 diabetes mellitus without complications: Secondary | ICD-10-CM | POA: Diagnosis not present

## 2017-12-10 MED ORDER — PREDNISONE 20 MG PO TABS: ORAL_TABLET | ORAL | 0 refills | Status: DC

## 2017-12-10 MED ORDER — IPRATROPIUM-ALBUTEROL 0.5-2.5 (3) MG/3ML IN SOLN
3.0000 mL | RESPIRATORY_TRACT | Status: AC
  Administered 2017-12-10 (×3): 3 mL via RESPIRATORY_TRACT
  Filled 2017-12-10: qty 9

## 2017-12-10 MED ORDER — HYDROCOD POLST-CPM POLST ER 10-8 MG/5ML PO SUER
5.0000 mL | Freq: Once | ORAL | Status: AC
  Administered 2017-12-10: 5 mL via ORAL
  Filled 2017-12-10: qty 5

## 2017-12-10 MED ORDER — PREDNISONE 20 MG PO TABS
60.0000 mg | ORAL_TABLET | Freq: Once | ORAL | Status: AC
  Administered 2017-12-10: 60 mg via ORAL
  Filled 2017-12-10: qty 3

## 2017-12-10 MED ORDER — HYDROCOD POLST-CPM POLST ER 10-8 MG/5ML PO SUER: 5.0000 mL | Freq: Every evening | ORAL | 0 refills | Status: DC | PRN

## 2017-12-10 NOTE — ED Provider Notes (Signed)
[facility] DEPT Provider Note   CSN: [id number] Arrival date & time: 12/10/17  1113     History   Chief Complaint Chief Complaint  Patient presents with  . Cough  . Generalized Body Aches  . Headache    HPI [name] is a 67 y.o. female.  67 yo F with a chief complaint of cough and congestion.  Going on for the past 3 days.  And feels exhausted and she is not able to sleep because when she lays back flat her coughing increases.  She is tried multiple over-the-counter regiment without improvement.  Denies fevers or chills.  Having some diffuse myalgias from coughing.  Has a history of COPD and has been using her inhaler with some mild improvement.  Babysat some grandchildren who had a gastroenteritis, denies other sick contacts.  The history is provided by the patient.  Illness  This is a new problem. The current episode started 2 days ago. The problem occurs constantly. The problem has been gradually worsening. Associated symptoms include chest pain (with coughing). Pertinent negatives include no headaches and no shortness of breath. Nothing aggravates the symptoms. Nothing relieves the symptoms. She has tried nothing for the symptoms. The treatment provided no relief.    Past Medical History:  Diagnosis Date  . Abscess in epidural space of L2-L5 lumbar spine 03/2006  . Anemia, iron deficiency   . Arthritis   . Breast cancer ([place]) 06/12/2017   right breast  . CAD (coronary artery disease), native coronary artery 03/30/2015  . Cataract   . Colon polyp    a. Multiple colonic polyps status post colonoscopy in October 2007, consistent with tubular adenoma, tubulovillous adenoma with no high-grade dysplasia or malignancy identified.   . Constipation   . Coronary artery disease    a. s/p CABG 06/2010: S-D2; S-PDA (at time of MV surgery).  . Depression   . Diabetes mellitus    type II  . Family history of breast cancer   . Family history of  prostate cancer   . Genetic testing 09/25/2017   Multi-Cancer panel (83 genes) @ [facility] - No pathogenic mutations detected  . GERD (gastroesophageal reflux disease)   . Hx of transient ischemic attack (TIA)    a. See stroke section.  . Hyperlipidemia   . Hypertension   . Hypoxia    a. Has history of acute hypoxic respiratory failure in the setting of bronchitis/PNA or prior admissions.  [name] MRSA infection    a. History of recurrent skin infection and soft tissue abscesses, with MRSA positive in the past.  . 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 mitral valve - s/p resection and MV repair 06/2010 Dr. [name].  . 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 Sleep apnea   . Stenosis of middle cerebral artery    a. Distal R MCA.  . Stroke [facility])    a. 11/2009: mitral mass diagnosed at this time, also has distal R MCA stenosis, tx with coumadin. b. Readmitted 05/2010 with TIA symptoms - had not been taking Coumadin. s/p MV surgery 06/2010. Coumadin stopped 2013 after review of chart by Dr. [name] since mass was removed (stroke felt possibly related to this).     Patient Active Problem List   Diagnosis Date Noted  . Genetic testing 09/25/2017  . Family history of breast cancer   . Family history of prostate cancer   . Malignant neoplasm of upper-outer quadrant of right breast in female, estrogen  receptor positive ([place]) 06/26/2017  . Lumbar herniated disc 04/15/2015  . Abnormal nuclear stress test 04/03/2015  . 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 CAD (coronary artery disease), native coronary artery 03/30/2015  . COPD (chronic obstructive pulmonary disease) ([place]) 12/24/2013  . Acute respiratory failure with hypoxia ([place]) 02/16/2013  . S/P mitral valve repair 11/16/2010  . Dyslipidemia 06/29/2010  . Diabetes ([place]) 09/29/2006  . ANEMIA-IRON DEFICIENCY 09/29/2006  . Essential hypertension 09/29/2006    Past Surgical History:  Procedure Laterality Date  . BREAST LUMPECTOMY  WITH RADIOACTIVE SEED AND SENTINEL LYMPH NODE BIOPSY Right 07/20/2017   Procedure: RIGHT BREAST LUMPECTOMY WITH RADIOACTIVE SEED AND SENTINEL LYMPH NODE BIOPSY;  Surgeon: Alphonsa Overall, MD;  Location: [facility];  Service: General;  Laterality: Right;  . CARDIAC CATHETERIZATION  04/03/2015   Procedure: Left Heart Cath and Cors/Grafts Angiography;  Surgeon: Peter M Martinique, MD;  Location: [facility] CV [facility];  Service: Cardiovascular;;  . CORONARY ARTERY BYPASS GRAFT  1/12  . history of ankle fractures requiring surgery    . LUMBAR LAMINECTOMY/DECOMPRESSION MICRODISCECTOMY Left 04/15/2015   Procedure: LUMBAR LAMINECTOMY/DECOMPRESSION MICRODISCECTOMY 1 LEVEL;  Surgeon: Newman Pies, MD;  Location: [facility];  Service: Neurosurgery;  Laterality: Left;  Left L23 microdiskectomy  . MV repair and resection of mass  1/12  . TONSILLECTOMY  1971  . TOTAL ABDOMINAL HYSTERECTOMY  1978  . VESICOVAGINAL FISTULA CLOSURE W/ TAH       OB History   None      Home Medications    Prior to Admission medications   Medication Sig Start Date End Date Taking? Authorizing Provider  albuterol (PROVENTIL HFA;VENTOLIN HFA) 108 (90 Base) MCG/ACT inhaler Inhale 2 puffs into the lungs every 6 (six) hours as needed for wheezing or shortness of breath. 07/10/17  Yes Hairston, Maylon Peppers, FNP  amLODipine (NORVASC) 10 MG tablet Take 1 tablet (10 mg total) by mouth daily. 07/10/17  Yes Alfonse Spruce, FNP  aspirin EC 81 MG tablet Take 1 tablet (81 mg total) by mouth daily. 06/26/14  Yes Lance Bosch, NP  atorvastatin (LIPITOR) 40 MG tablet Take 1 tablet (40 mg total) by mouth daily. 11/12/17  Yes [name], MD  buPROPion (WELLBUTRIN XL) 150 MG 24 hr tablet Take 1 tablet (150 mg total) by mouth daily. 07/10/17  Yes Alfonse Spruce, FNP  carvedilol (COREG) 6.25 MG tablet Take 1 tablet (6.25 mg total) by mouth 2 (two) times daily with a meal. 11/09/17  Yes [name], MD  ferrous sulfate (FERROUSUL)  325 (65 FE) MG tablet Take 1 tablet (325 mg total) by mouth daily with breakfast. 11/12/17  Yes [name], MD  glipiZIDE (GLUCOTROL) 5 MG tablet Take 1 tablet (5 mg total) by mouth daily before breakfast. 11/09/17  Yes [name], MD  hydrochlorothiazide (HYDRODIURIL) 25 MG tablet Take 1 tablet (25 mg total) by mouth daily. 07/10/17  Yes Fredia Beets R, FNP  letrozole (FEMARA) 2.5 MG tablet Take 1 tablet (2.5 mg total) by mouth daily. 09/21/17  Yes Nicholas Lose, MD  linagliptin (TRADJENTA) 5 MG TABS tablet Take 5 mg by mouth daily.   Yes [provider]  losartan (COZAAR) 100 MG tablet Take 1 tablet (100 mg total) by mouth daily. 04/24/17  Yes Alfonse Spruce, FNP  metFORMIN (GLUCOPHAGE) 1000 MG tablet Take 1 tablet (1,000 mg total) by mouth 2 (two) times daily with a meal. 07/10/17  Yes Hairston, Maylon Peppers, FNP  Multiple  Vitamins-Minerals (CENTRUM SILVER 50+WOMEN) TABS Take 1 tablet by mouth daily.   Yes [provider]  pantoprazole (PROTONIX) 40 MG tablet Take 1 tablet (40 mg total) by mouth daily. 11/09/17  Yes [name], MD  traZODone (DESYREL) 50 MG tablet Take 1 tablet (50 mg total) by mouth at bedtime as needed for sleep. 10/11/17  Yes [name], MD  ACCU-CHEK SOFTCLIX LANCETS lancets Use as instructed 07/10/17   Alfonse Spruce, FNP  acetaminophen (TYLENOL) 500 MG tablet Take 1 tablet (500 mg total) by mouth every 8 (eight) hours as needed. Patient taking differently: Take 500 mg by mouth every 8 (eight) hours as needed for moderate pain.  11/09/17   [name], MD  Blood Glucose Monitoring Suppl (ACCU-CHEK AVIVA PLUS) w/Device KIT Use as directed 07/10/17   Alfonse Spruce, FNP  chlorpheniramine-HYDROcodone (TUSSIONEX PENNKINETIC ER) 10-8 MG/5ML SUER Take 5 mLs by mouth at bedtime as needed for cough. 12/10/17   Deno Etienne, DO  diclofenac sodium (VOLTAREN) 1 % GEL Apply 2 g topically 4 (four) times daily as  needed. Patient taking differently: Apply 2 g topically 4 (four) times daily as needed (pain).  04/24/17   Alfonse Spruce, FNP  glucose blood (ACCU-CHEK AVIVA PLUS) test strip Use as instructed 07/10/17   Alfonse Spruce, FNP  polyethylene glycol (MIRALAX / GLYCOLAX) packet Take 17 g by mouth daily as needed for mild constipation or moderate constipation. 04/24/17   Alfonse Spruce, FNP  predniSONE (DELTASONE) 20 MG tablet 2 tabs po daily x 4 days 12/10/17   Deno Etienne, DO    Family History Family History  Problem Relation Age of Onset  . Colon cancer Mother        dx 3s; deceased 61  . Irritable bowel syndrome Mother   . Prostate cancer Father        deceased 62s  . Cirrhosis Sister        died of GI bleed associated with cirrhosis of the liver  . Stroke Brother   . Prostate cancer Brother 51       deceased 4  . Diabetes Brother        x3  . Kidney disease Brother   . Cancer Brother 48       unk. type  . Breast cancer Cousin        several maternal 1st and 2nd cousins with breast cancer    Social History Social History   Tobacco Use  . Smoking status: Former Smoker    Last attempt to quit: 10/14/2010    Years since quitting: 7.1  . Smokeless tobacco: Never Used  Substance Use Topics  . Alcohol use: No  . Drug use: No     Allergies   Patient has no known allergies.   Review of Systems Review of Systems  Constitutional: Negative for chills and fever.  HENT: Positive for congestion. Negative for rhinorrhea.   Eyes: Negative for redness and visual disturbance.  Respiratory: Positive for cough. Negative for shortness of breath and wheezing.   Cardiovascular: Positive for chest pain (with coughing). Negative for palpitations.  Gastrointestinal: Negative for nausea and vomiting.  Genitourinary: Negative for dysuria and urgency.  Musculoskeletal: Positive for myalgias. Negative for arthralgias.  Skin: Negative for pallor and wound.  Neurological:  Negative for dizziness and headaches.     Physical Exam Updated Vital Signs BP (!) 182/72   Pulse 96   Temp 99.8 F (37.7 C) (Oral)  Resp 16   Ht 5' 7.75" (1.721 m)   Wt 108.9 kg (240 lb)   SpO2 91%   BMI 36.76 kg/m   Physical Exam  Constitutional: She is oriented to person, place, and time. She appears well-developed and well-nourished. No distress.  HENT:  Head: Normocephalic and atraumatic.  Swollen turbinates, posterior nasal drip, no noted sinus ttp, tm normal bilaterally.    Eyes: Pupils are equal, round, and reactive to light. EOM are normal.  Neck: Normal range of motion. Neck supple.  Cardiovascular: Normal rate and regular rhythm. Exam reveals no gallop and no friction rub.  No murmur heard. Pulmonary/Chest: Effort normal. She has no wheezes. She has no rales. She exhibits no tenderness.  Prolonged expiration, difficult to assess due to frequent bronchospasm  Abdominal: Soft. She exhibits no distension and no mass. There is no tenderness. There is no guarding.  Musculoskeletal: She exhibits no edema or tenderness.  Neurological: She is alert and oriented to person, place, and time.  Skin: Skin is warm and dry. She is not diaphoretic.  Psychiatric: She has a normal mood and affect. Her behavior is normal.  Nursing note and vitals reviewed.    ED Treatments / Results  Labs (all labs ordered are listed, but only abnormal results are displayed) Labs Reviewed - No data to display  EKG None  Radiology Dg Chest 2 View  Result Date: 12/10/2017 CLINICAL DATA:  Pt reports non-productive cough and sneezing, body aches, headache, no appetite, not able to sleep due to cough x4 days. Denies fever. EXAM: CHEST - 2 VIEW COMPARISON:  12/24/2013 FINDINGS: Lateral view degraded by patient arm position. Prior median sternotomy. Midline trachea. Normal heart size. Atherosclerosis in the transverse aorta. No pleural effusion or pneumothorax. Clear lungs. IMPRESSION: No acute  cardiopulmonary disease. Aortic Atherosclerosis (ICD10-I70.0). Electronically Signed   By: Abigail Miyamoto M.D.   On: 12/10/2017 12:12    Procedures Procedures (including critical care time)  Medications Ordered in ED Medications  chlorpheniramine-HYDROcodone (TUSSIONEX) 10-8 MG/5ML suspension 5 mL (5 mLs Oral Given 12/10/17 1202)  ipratropium-albuterol (DUONEB) 0.5-2.5 (3) MG/3ML nebulizer solution 3 mL (3 mLs Nebulization Given 12/10/17 1205)  predniSONE (DELTASONE) tablet 60 mg (60 mg Oral Given 12/10/17 1202)     Initial Impression / Assessment and Plan / ED Course  I have reviewed the triage vital signs and the nursing notes.  Pertinent labs & imaging results that were available during my care of the patient were reviewed by me and considered in my medical decision making (see chart for details).     67 yo  F with cough and congestion and body aches.  Going on for the past 3 days.  My exam patient is well-appearing nontoxic.  Clear lung sounds.  With history of COPD we will do 3 duo nebs and steroids and reassess. CXR my view without focal infiltrate.   Ambulates without hypoxia.  Feeling better.  Better aeration on my exam.  D/c home, burst steroids, cough medicine.   3:29 PM:  I have discussed the diagnosis/risks/treatment options with the patient and family and believe the pt to be eligible for discharge home to follow-up with PCP. We also discussed returning to the ED immediately if new or worsening sx occur. We discussed the sx which are most concerning (e.g., sudden worsening pain, fever, inability to tolerate by mouth) that necessitate immediate return. Medications administered to the patient during their visit and any new prescriptions provided to the patient are listed below.  Medications  given during this visit Medications  chlorpheniramine-HYDROcodone (TUSSIONEX) 10-8 MG/5ML suspension 5 mL (5 mLs Oral Given 12/10/17 1202)  ipratropium-albuterol (DUONEB) 0.5-2.5 (3) MG/3ML  nebulizer solution 3 mL (3 mLs Nebulization Given 12/10/17 1205)  predniSONE (DELTASONE) tablet 60 mg (60 mg Oral Given 12/10/17 1202)      The patient appears reasonably screen and/or stabilized for discharge and I doubt any other medical condition or other [facility] requiring further screening, evaluation, or treatment in the ED at this time prior to discharge.    Final Clinical Impressions(s) / ED Diagnoses   Final diagnoses:  COPD with acute exacerbation [facility])    ED Discharge Orders        Ordered    predniSONE (DELTASONE) 20 MG tablet     12/10/17 1348    chlorpheniramine-HYDROcodone (TUSSIONEX PENNKINETIC ER) 10-8 MG/5ML SUER  At bedtime PRN     12/10/17 [place], Joli Koob, DO 12/10/17 1529

## 2017-12-10 NOTE — ED Notes (Signed)
O2 STATS 89% heart rate 118 while walking

## 2017-12-10 NOTE — ED Triage Notes (Signed)
Pt reports non-productive cough and sneezing, body aches, headache, no appetite, not able to sleep due to cough since Wed last week.

## 2017-12-10 NOTE — Discharge Instructions (Signed)
Use your inhaler every 4 hours(6 puffs) while awake, return for sudden worsening shortness of breath, or if you need to use your inhaler more often.  ° °

## 2018-01-04 ENCOUNTER — Ambulatory Visit: Payer: Medicare PPO | Attending: Internal Medicine | Admitting: Internal Medicine

## 2018-01-04 ENCOUNTER — Encounter: Payer: Self-pay | Admitting: Internal Medicine

## 2018-01-04 VITALS — BP 138/83 | HR 84 | Temp 98.1°F | Resp 16 | Ht 68.0 in | Wt 236.2 lb

## 2018-01-04 DIAGNOSIS — Z853 Personal history of malignant neoplasm of breast: Secondary | ICD-10-CM | POA: Insufficient documentation

## 2018-01-04 DIAGNOSIS — Z170 Estrogen receptor positive status [ER+]: Secondary | ICD-10-CM | POA: Insufficient documentation

## 2018-01-04 DIAGNOSIS — Z87891 Personal history of nicotine dependence: Secondary | ICD-10-CM | POA: Insufficient documentation

## 2018-01-04 DIAGNOSIS — D509 Iron deficiency anemia, unspecified: Secondary | ICD-10-CM | POA: Insufficient documentation

## 2018-01-04 DIAGNOSIS — F329 Major depressive disorder, single episode, unspecified: Secondary | ICD-10-CM | POA: Diagnosis not present

## 2018-01-04 DIAGNOSIS — Z800 Family history of malignant neoplasm of digestive organs: Secondary | ICD-10-CM | POA: Insufficient documentation

## 2018-01-04 DIAGNOSIS — Z79899 Other long term (current) drug therapy: Secondary | ICD-10-CM | POA: Insufficient documentation

## 2018-01-04 DIAGNOSIS — I2510 Atherosclerotic heart disease of native coronary artery without angina pectoris: Secondary | ICD-10-CM | POA: Diagnosis not present

## 2018-01-04 DIAGNOSIS — N183 Chronic kidney disease, stage 3 (moderate): Secondary | ICD-10-CM | POA: Diagnosis not present

## 2018-01-04 DIAGNOSIS — Z7984 Long term (current) use of oral hypoglycemic drugs: Secondary | ICD-10-CM | POA: Insufficient documentation

## 2018-01-04 DIAGNOSIS — M545 Low back pain: Secondary | ICD-10-CM | POA: Diagnosis not present

## 2018-01-04 DIAGNOSIS — G8929 Other chronic pain: Secondary | ICD-10-CM | POA: Insufficient documentation

## 2018-01-04 DIAGNOSIS — I129 Hypertensive chronic kidney disease with stage 1 through stage 4 chronic kidney disease, or unspecified chronic kidney disease: Secondary | ICD-10-CM | POA: Insufficient documentation

## 2018-01-04 DIAGNOSIS — Z952 Presence of prosthetic heart valve: Secondary | ICD-10-CM | POA: Diagnosis not present

## 2018-01-04 DIAGNOSIS — Z7982 Long term (current) use of aspirin: Secondary | ICD-10-CM | POA: Diagnosis not present

## 2018-01-04 DIAGNOSIS — Z951 Presence of aortocoronary bypass graft: Secondary | ICD-10-CM | POA: Diagnosis not present

## 2018-01-04 DIAGNOSIS — E1122 Type 2 diabetes mellitus with diabetic chronic kidney disease: Secondary | ICD-10-CM | POA: Diagnosis present

## 2018-01-04 DIAGNOSIS — J40 Bronchitis, not specified as acute or chronic: Secondary | ICD-10-CM | POA: Insufficient documentation

## 2018-01-04 DIAGNOSIS — E1165 Type 2 diabetes mellitus with hyperglycemia: Secondary | ICD-10-CM | POA: Insufficient documentation

## 2018-01-04 DIAGNOSIS — E785 Hyperlipidemia, unspecified: Secondary | ICD-10-CM | POA: Insufficient documentation

## 2018-01-04 LAB — GLUCOSE, POCT (MANUAL RESULT ENTRY): POC Glucose: 290 mg/dL — AB (ref 70–99)

## 2018-01-04 NOTE — Progress Notes (Signed)
Patient ID: [name], female    DOB: 1951-05-20  MRN: [id number]  CC: Follow-up   Subjective: [name] is a 66 y.o. female who presents for 2 mth f/u Her concerns today include:  Pt with hx of HTN, DM, HL, depression, chronic back pain, RT breast CA (intraductal CA, ER/PR+, lumpectomy and XRT), CKD stage 3, CVA 2011 (residual weakness on LT side), CAD s/p CABG and MVR  Patient seen in ED 12/10/2017 for acute cough was using regular cough drops after that point.  Looks like she was probably diagnosed with bronchitis and was discharged home with cough syrup, prednisone and albuterol inhaler.  She reports that the cough is now less.  She uses albuterol now only as needed.  Anemia: I went over blood test from last visit.  Her anemia had improved but iron studies certainly consistent with iron deficiency anemia.  I recommend starting iron supplement which she has been taking.   Gait distrubance and chronic LBP:  Did not start going to gym as yet.  She has silver sneakers.  Patient states that she has had a lot going on including getting over the bronchitis from last month.   DM: Not checking blood sugars consistently recently.  She admits that she has only been taking metformin once a day instead of twice a day because she has only been eating a full meal about once a day since being sick with bronchitis. -She has tried cutting back on sugary drinks but admits that she still drinks lemonade with regular sugar.  Patient Active Problem List   Diagnosis Date Noted  . Genetic testing 09/25/2017  . Family history of breast cancer   . Family history of prostate cancer   . Malignant neoplasm of upper-outer quadrant of right breast in female, estrogen receptor positive ([place]) 06/26/2017  . Lumbar herniated disc 04/15/2015  . Abnormal nuclear stress test 04/03/2015  . 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 CAD (coronary artery disease), native coronary artery 03/30/2015  .  COPD (chronic obstructive pulmonary disease) ([place]) 12/24/2013  . Acute respiratory failure with hypoxia ([place]) 02/16/2013  . S/P mitral valve repair 11/16/2010  . Dyslipidemia 06/29/2010  . Diabetes ([place]) 09/29/2006  . ANEMIA-IRON DEFICIENCY 09/29/2006  . Essential hypertension 09/29/2006     Current Outpatient Medications on File Prior to Visit  Medication Sig Dispense Refill  . ACCU-CHEK SOFTCLIX LANCETS lancets Use as instructed 100 each 12  . albuterol (PROVENTIL HFA;VENTOLIN HFA) 108 (90 Base) MCG/ACT inhaler Inhale 2 puffs into the lungs every 6 (six) hours as needed for wheezing or shortness of breath. 1 Inhaler 2  . amLODipine (NORVASC) 10 MG tablet Take 1 tablet (10 mg total) by mouth daily. 30 tablet 0  . aspirin EC 81 MG tablet Take 1 tablet (81 mg total) by mouth daily. 30 tablet 5  . atorvastatin (LIPITOR) 40 MG tablet Take 1 tablet (40 mg total) by mouth daily. 90 tablet 2  . Blood Glucose Monitoring Suppl (ACCU-CHEK AVIVA PLUS) w/Device KIT Use as directed 1 kit 0  . buPROPion (WELLBUTRIN XL) 150 MG 24 hr tablet Take 1 tablet (150 mg total) by mouth daily. 30 tablet 0  . carvedilol (COREG) 6.25 MG tablet Take 1 tablet (6.25 mg total) by mouth 2 (two) times daily with a meal. 180 tablet 3  . diclofenac sodium (VOLTAREN) 1 % GEL Apply 2 g topically 4 (four) times daily as needed. (Patient taking differently: Apply 2 g topically 4 (four) times  daily as needed (pain). ) 1 Tube 1  . ferrous sulfate (FERROUSUL) 325 (65 FE) MG tablet Take 1 tablet (325 mg total) by mouth daily with breakfast. 90 tablet 0  . glipiZIDE (GLUCOTROL) 5 MG tablet Take 1 tablet (5 mg total) by mouth daily before breakfast. 90 tablet 3  . glucose blood (ACCU-CHEK AVIVA PLUS) test strip Use as instructed 100 each 12  . hydrochlorothiazide (HYDRODIURIL) 25 MG tablet Take 1 tablet (25 mg total) by mouth daily. 30 tablet 0  . letrozole (FEMARA) 2.5 MG tablet Take 1 tablet (2.5 mg total) by mouth daily. 90 tablet 3   . linagliptin (TRADJENTA) 5 MG TABS tablet Take 5 mg by mouth daily.    [name] losartan (COZAAR) 100 MG tablet Take 1 tablet (100 mg total) by mouth daily. 90 tablet 1  . metFORMIN (GLUCOPHAGE) 1000 MG tablet Take 1 tablet (1,000 mg total) by mouth 2 (two) times daily with a meal. 60 tablet 0  . pantoprazole (PROTONIX) 40 MG tablet Take 1 tablet (40 mg total) by mouth daily. 90 tablet 3  . polyethylene glycol (MIRALAX / GLYCOLAX) packet Take 17 g by mouth daily as needed for mild constipation or moderate constipation. 30 each 2  . traZODone (DESYREL) 50 MG tablet Take 1 tablet (50 mg total) by mouth at bedtime as needed for sleep. 30 tablet 0  . acetaminophen (TYLENOL) 500 MG tablet Take 1 tablet (500 mg total) by mouth every 8 (eight) hours as needed. (Patient not taking: Reported on 01/04/2018) 100 tablet 1  . Multiple Vitamins-Minerals (CENTRUM SILVER 50+WOMEN) TABS Take 1 tablet by mouth daily.     No current facility-administered medications on file prior to visit.     No Known Allergies  Social History   Socioeconomic History  . Marital status: Married    Spouse name: Not on file  . Number of children: 3  . Years of education: Not on file  . Highest education level: Not on file  Occupational History    Employer: UNEMPLOYED  Social Needs  . Financial resource strain: Not on file  . Food insecurity:    Worry: Not on file    Inability: Not on file  . Transportation needs:    Medical: Not on file    Non-medical: Not on file  Tobacco Use  . Smoking status: Former Smoker    Last attempt to quit: 10/14/2010    Years since quitting: 7.2  . Smokeless tobacco: Never Used  Substance and Sexual Activity  . Alcohol use: No  . Drug use: No  . Sexual activity: Never  Lifestyle  . Physical activity:    Days per week: Not on file    Minutes per session: Not on file  . Stress: Not on file  Relationships  . Social connections:    Talks on phone: Not on file    Gets together: Not on  file    Attends religious service: Not on file    Active member of club or organization: Not on file    Attends meetings of clubs or organizations: Not on file    Relationship status: Not on file  . Intimate partner violence:    Fear of current or ex partner: Not on file    Emotionally abused: Not on file    Physically abused: Not on file    Forced sexual activity: Not on file  Other Topics Concern  . Not on file  Social History Narrative   Lives with  husband, stay at home, uses cane occasionally, still active/ambulatory.     Family History  Problem Relation Age of Onset  . Colon cancer Mother        dx 23s; deceased 40  . Irritable bowel syndrome Mother   . Prostate cancer Father        deceased 50s  . Cirrhosis Sister        died of GI bleed associated with cirrhosis of the liver  . Stroke Brother   . Prostate cancer Brother 34       deceased 40  . Diabetes Brother        x3  . Kidney disease Brother   . Cancer Brother 19       unk. type  . Breast cancer Cousin        several maternal 1st and 2nd cousins with breast cancer    Past Surgical History:  Procedure Laterality Date  . BREAST LUMPECTOMY WITH RADIOACTIVE SEED AND SENTINEL LYMPH NODE BIOPSY Right 07/20/2017   Procedure: RIGHT BREAST LUMPECTOMY WITH RADIOACTIVE SEED AND SENTINEL LYMPH NODE BIOPSY;  Surgeon: Alphonsa Overall, MD;  Location: [facility];  Service: General;  Laterality: Right;  . CARDIAC CATHETERIZATION  04/03/2015   Procedure: Left Heart Cath and Cors/Grafts Angiography;  Surgeon: Peter M Martinique, MD;  Location: [facility];  Service: Cardiovascular;;  . CORONARY ARTERY BYPASS GRAFT  1/12  . history of ankle fractures requiring surgery    . LUMBAR LAMINECTOMY/DECOMPRESSION MICRODISCECTOMY Left 04/15/2015   Procedure: LUMBAR LAMINECTOMY/DECOMPRESSION MICRODISCECTOMY 1 LEVEL;  Surgeon: Newman Pies, MD;  Location: [facility];  Service: Neurosurgery;  Laterality: Left;  Left L23 microdiskectomy  . MV  repair and resection of mass  1/12  . TONSILLECTOMY  1971  . TOTAL ABDOMINAL HYSTERECTOMY  1978  . VESICOVAGINAL FISTULA CLOSURE W/ TAH      ROS: Review of Systems Negative except as stated above PHYSICAL EXAM: BP 138/83 (BP Location: Left Arm, Patient Position: Sitting, Cuff Size: Large)   Pulse 84   Temp 98.1 F (36.7 C) (Oral)   Resp 16   Ht _0  (1.727 m)   Wt 236 lb 3.2 oz (107.1 kg)   SpO2 99%   BMI 35.91 kg/m   Physical Exam  General appearance - alert, well appearing, and in no distress Mental status - normal mood, behavior, speech, dress, motor activity, and thought processes Chest - clear to auscultation, no wheezes, rales or rhonchi, symmetric air entry Heart - normal rate, regular rhythm, normal S1, S2, no murmurs, rubs, clicks or gallops Extremities - peripheral pulses normal, no pedal edema, no clubbing or cyanosis   Results for orders placed or performed in visit on 01/04/18  Glucose (CBG)  Result Value Ref Range   POC Glucose 290 (A) 70 - 99 mg/dl   Lab Results  Component Value Date   HGBA1C 9.4 11/09/2017     ASSESSMENT AND PLAN: 1. Uncontrolled type 2 diabetes mellitus with hyperglycemia ([place]) Encourage patient to get back on track with taking the metformin twice a day.  She has agreed to do this.  She will continue glipizide.  She will continue to work on cutting back on sugary drinks. -She plans to start going to the gym in a few weeks. - Glucose (CBG)  2. Iron deficiency anemia, unspecified iron deficiency anemia type Continue iron.  3. Bronchitis She is on the tail end of this.   Patient was given the opportunity to ask questions.  Patient  verbalized understanding of the plan and was able to repeat key elements of the plan.   Orders Placed This Encounter  Procedures  . Glucose (CBG)     Requested Prescriptions    No prescriptions requested or ordered in this encounter    Return in about 3 months (around 04/06/2018).  Karle Plumber, MD, FACP

## 2018-01-10 ENCOUNTER — Telehealth: Payer: Self-pay

## 2018-01-10 NOTE — Telephone Encounter (Signed)
Spoke with pt reminding of [facility] visit with NP on 7/26/1/ at 11:30 am.  Pt said she will come to appt.

## 2018-01-19 ENCOUNTER — Encounter: Payer: Self-pay | Admitting: Adult Health

## 2018-01-19 ENCOUNTER — Telehealth: Payer: Self-pay | Admitting: Hematology and Oncology

## 2018-01-19 ENCOUNTER — Inpatient Hospital Stay: Payer: Medicare PPO | Attending: Adult Health | Admitting: Adult Health

## 2018-01-19 VITALS — BP 196/76 | HR 86 | Temp 98.5°F | Resp 17 | Ht 68.0 in | Wt 243.1 lb

## 2018-01-19 DIAGNOSIS — Z923 Personal history of irradiation: Secondary | ICD-10-CM | POA: Diagnosis not present

## 2018-01-19 DIAGNOSIS — Z170 Estrogen receptor positive status [ER+]: Secondary | ICD-10-CM | POA: Insufficient documentation

## 2018-01-19 DIAGNOSIS — Z87891 Personal history of nicotine dependence: Secondary | ICD-10-CM | POA: Insufficient documentation

## 2018-01-19 DIAGNOSIS — C50411 Malignant neoplasm of upper-outer quadrant of right female breast: Secondary | ICD-10-CM | POA: Diagnosis not present

## 2018-01-19 DIAGNOSIS — Z79899 Other long term (current) drug therapy: Secondary | ICD-10-CM | POA: Diagnosis not present

## 2018-01-19 DIAGNOSIS — Z79811 Long term (current) use of aromatase inhibitors: Secondary | ICD-10-CM | POA: Insufficient documentation

## 2018-01-19 DIAGNOSIS — E2839 Other primary ovarian failure: Secondary | ICD-10-CM

## 2018-01-19 NOTE — Progress Notes (Signed)
CLINIC:  Survivorship   REASON FOR VISIT:  Routine follow-up post-treatment for a recent history of breast cancer.  BRIEF ONCOLOGIC HISTORY:    Malignant neoplasm of upper-outer quadrant of right breast in female, estrogen receptor positive ([place])   06/12/2017 Initial Diagnosis    Right breast biopsy 10:00: IDC grade 2, ER 95%, PR 30%, Ki-67 15%, HER-2 negative ratio 1.36, 1.7 cm mass in the right upper outer quadrant, T1CN0 stage I a clinical stage      07/21/2017 Surgery    Rt Lumpectomy: Grade 2 IDC 1.7 cm, 0/2 LN Neg, Margins Neg, ER 95%, PR 30%, Ki-67 15%, HER-2 negative ratio 1.36 T1cN0 Stage 1A      08/22/2017 - 09/15/2017 Radiation Therapy    Adjuvant radiation therapy      08/23/2017 Oncotype testing    Oncotype DX recurrence score 14: Risk of distant recurrence of 9 years: 4%, low risk      10/19/2017 -  Anti-estrogen oral therapy    Letrozole daily       INTERVAL HISTORY:  [name] presents to the [facility] today for our initial meeting to review her survivorship care plan detailing her treatment course for breast cancer, as well as monitoring long-term side effects of that treatment, education regarding health maintenance, screening, and overall wellness and health promotion.     Overall, [name] reports doing moderately well.  She has a chronic back pain issue that has been present for many years and is followed by her PCP.  She notes that she has been referred to a pain clinic, however she cannot afford those appointments so she just lives in pain.    She is taking the letrozole daily.  She is tolerating it well.  She has manageable hot flashes, and manageable vaginal dryness.  She notes that she has not had an increase in arthralgias since starting the Letrozole.   REVIEW OF SYSTEMS:  Review of Systems  Constitutional: Negative for appetite change, chills, fatigue and unexpected weight change.  HENT:   Negative for hearing loss and lump/mass.    Eyes: Negative for eye problems and icterus.  Respiratory: Negative for chest tightness, cough and shortness of breath.   Cardiovascular: Negative for chest pain, leg swelling and palpitations.  Gastrointestinal: Negative for abdominal distention, abdominal pain, constipation, diarrhea, nausea and vomiting.  Endocrine: Negative for hot flashes.  Genitourinary: Negative for dyspareunia, vaginal bleeding and vaginal discharge.   Musculoskeletal: Positive for back pain.  Skin: Negative for itching and rash.  Neurological: Negative for dizziness.  Hematological: Negative for adenopathy.  Psychiatric/Behavioral: Negative for depression. The patient is not nervous/anxious.    Breast: Denies any new nodularity, masses, tenderness, nipple changes, or nipple discharge.      ONCOLOGY TREATMENT TEAM:  1. Surgeon:  Dr. Lucia Gaskins at [facility] 2. Medical Oncologist: Dr. Lindi Adie  3. Radiation Oncologist: Dr. Lisbeth Renshaw    PAST MEDICAL/SURGICAL HISTORY:  Past Medical History:  Diagnosis Date  . Abscess in epidural space of L2-L5 lumbar spine 03/2006  . Anemia, iron deficiency   . Arthritis   . Breast cancer ([place]) 06/12/2017   right breast  . CAD (coronary artery disease), native coronary artery 03/30/2015  . Cataract   . Colon polyp    a. Multiple colonic polyps status post colonoscopy in October 2007, consistent with tubular adenoma, tubulovillous adenoma with no high-grade dysplasia or malignancy identified.   . Constipation   . Coronary artery disease    a. s/p CABG  06/2010: S-D2; S-PDA (at time of MV surgery).  . Depression   . Diabetes mellitus    type II  . Family history of breast cancer   . Family history of prostate cancer   . Genetic testing 09/25/2017   Multi-Cancer panel (83 genes) @ [facility] - No pathogenic mutations detected  . GERD (gastroesophageal reflux disease)   . Hx of transient ischemic attack (TIA)    a. See stroke section.  . Hyperlipidemia   .  Hypertension   . Hypoxia    a. Has history of acute hypoxic respiratory failure in the setting of bronchitis/PNA or prior admissions.  [name] MRSA infection    a. History of recurrent skin infection and soft tissue abscesses, with MRSA positive in the past.  . 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 mitral valve - s/p resection and MV repair 06/2010 Dr. [name].  . 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 Sleep apnea   . Stenosis of middle cerebral artery    a. Distal R MCA.  . Stroke [facility])    a. 11/2009: mitral mass diagnosed at this time, also has distal R MCA stenosis, tx with coumadin. b. Readmitted 05/2010 with TIA symptoms - had not been taking Coumadin. s/p MV surgery 06/2010. Coumadin stopped 2013 after review of chart by Dr. [name] since mass was removed (stroke felt possibly related to this).    Past Surgical History:  Procedure Laterality Date  . BREAST LUMPECTOMY WITH RADIOACTIVE SEED AND SENTINEL LYMPH NODE BIOPSY Right 07/20/2017   Procedure: RIGHT BREAST LUMPECTOMY WITH RADIOACTIVE SEED AND SENTINEL LYMPH NODE BIOPSY;  Surgeon: Alphonsa Overall, MD;  Location: [place] [facility];  Service: General;  Laterality: Right;  . CARDIAC CATHETERIZATION  04/03/2015   Procedure: Left Heart Cath and Cors/Grafts Angiography;  Surgeon: Peter M Martinique, MD;  Location: [facility];  Service: Cardiovascular;;  . CORONARY ARTERY BYPASS GRAFT  1/12  . history of ankle fractures requiring surgery    . LUMBAR LAMINECTOMY/DECOMPRESSION MICRODISCECTOMY Left 04/15/2015   Procedure: LUMBAR LAMINECTOMY/DECOMPRESSION MICRODISCECTOMY 1 LEVEL;  Surgeon: Newman Pies, MD;  Location: [place] [facility];  Service: Neurosurgery;  Laterality: Left;  Left L23 microdiskectomy  . MV repair and resection of mass  1/12  . TONSILLECTOMY  1971  . TOTAL ABDOMINAL HYSTERECTOMY  1978  . VESICOVAGINAL FISTULA CLOSURE W/ TAH       ALLERGIES:  No Known Allergies   CURRENT MEDICATIONS:  Outpatient Encounter Medications as of  01/19/2018  Medication Sig  . ACCU-CHEK SOFTCLIX LANCETS lancets Use as instructed  . acetaminophen (TYLENOL) 500 MG tablet Take 1 tablet (500 mg total) by mouth every 8 (eight) hours as needed.  [name] albuterol (PROVENTIL HFA;VENTOLIN HFA) 108 (90 Base) MCG/ACT inhaler Inhale 2 puffs into the lungs every 6 (six) hours as needed for wheezing or shortness of breath.  [name] amLODipine (NORVASC) 10 MG tablet Take 1 tablet (10 mg total) by mouth daily.  [name] aspirin EC 81 MG tablet Take 1 tablet (81 mg total) by mouth daily.  [name] atorvastatin (LIPITOR) 40 MG tablet Take 1 tablet (40 mg total) by mouth daily.  . Blood Glucose Monitoring Suppl (ACCU-CHEK AVIVA PLUS) w/Device KIT Use as directed  . buPROPion (WELLBUTRIN XL) 150 MG 24 hr tablet Take 1 tablet (150 mg total) by mouth daily.  . carvedilol (COREG) 6.25 MG tablet Take 1 tablet (6.25 mg total) by mouth 2 (two) times daily with a meal.  . diclofenac sodium (VOLTAREN) 1 % GEL Apply  2 g topically 4 (four) times daily as needed. (Patient taking differently: Apply 2 g topically 4 (four) times daily as needed (pain). )  . ferrous sulfate (FERROUSUL) 325 (65 FE) MG tablet Take 1 tablet (325 mg total) by mouth daily with breakfast.  . glipiZIDE (GLUCOTROL) 5 MG tablet Take 1 tablet (5 mg total) by mouth daily before breakfast.  . glucose blood (ACCU-CHEK AVIVA PLUS) test strip Use as instructed  . hydrochlorothiazide (HYDRODIURIL) 25 MG tablet Take 1 tablet (25 mg total) by mouth daily.  [name] letrozole (FEMARA) 2.5 MG tablet Take 1 tablet (2.5 mg total) by mouth daily.  [name] linagliptin (TRADJENTA) 5 MG TABS tablet Take 5 mg by mouth daily.  [name] losartan (COZAAR) 100 MG tablet Take 1 tablet (100 mg total) by mouth daily.  . metFORMIN (GLUCOPHAGE) 1000 MG tablet Take 1 tablet (1,000 mg total) by mouth 2 (two) times daily with a meal.  . pantoprazole (PROTONIX) 40 MG tablet Take 1 tablet (40 mg total) by mouth daily.  . polyethylene glycol (MIRALAX / GLYCOLAX) packet Take  17 g by mouth daily as needed for mild constipation or moderate constipation.  . traZODone (DESYREL) 50 MG tablet Take 1 tablet (50 mg total) by mouth at bedtime as needed for sleep.  . [DISCONTINUED] Multiple Vitamins-Minerals (CENTRUM SILVER 50+WOMEN) TABS Take 1 tablet by mouth daily.   No facility-administered encounter medications on file as of 01/19/2018.      ONCOLOGIC FAMILY HISTORY:  Family History  Problem Relation Age of Onset  . Colon cancer Mother        dx 55s; deceased 1  . Irritable bowel syndrome Mother   . Prostate cancer Father        deceased 68s  . Cirrhosis Sister        died of GI bleed associated with cirrhosis of the liver  . Stroke Brother   . Prostate cancer Brother 68       deceased 81  . Diabetes Brother        x3  . Kidney disease Brother   . Cancer Brother 31       unk. type  . Breast cancer Cousin        several maternal 1st and 2nd cousins with breast cancer     GENETIC COUNSELING/TESTING: negative  SOCIAL HISTORY:  Social History   Socioeconomic History  . Marital status: Married    Spouse name: Not on file  . Number of children: 3  . Years of education: Not on file  . Highest education level: Not on file  Occupational History    Employer: UNEMPLOYED  Social Needs  . Financial resource strain: Not on file  . Food insecurity:    Worry: Not on file    Inability: Not on file  . Transportation needs:    Medical: Not on file    Non-medical: Not on file  Tobacco Use  . Smoking status: Former Smoker    Last attempt to quit: 10/14/2010    Years since quitting: 7.2  . Smokeless tobacco: Never Used  Substance and Sexual Activity  . Alcohol use: No  . Drug use: No  . Sexual activity: Never  Lifestyle  . Physical activity:    Days per week: Not on file    Minutes per session: Not on file  . Stress: Not on file  Relationships  . Social connections:    Talks on phone: Not on file    Gets together: Not on  file    Attends  religious service: Not on file    Active member of club or organization: Not on file    Attends meetings of clubs or organizations: Not on file    Relationship status: Not on file  . Intimate partner violence:    Fear of current or ex partner: Not on file    Emotionally abused: Not on file    Physically abused: Not on file    Forced sexual activity: Not on file  Other Topics Concern  . Not on file  Social History Narrative   Lives with husband, stay at home, uses cane occasionally, still active/ambulatory.       PHYSICAL EXAMINATION:  Vital Signs:   Vitals:   01/19/18 1127  BP: (!) 196/76  Pulse: 86  Resp: 17  Temp: 98.5 F (36.9 C)  SpO2: 99%   Filed Weights   01/19/18 1127  Weight: 243 lb 1.6 oz (110.3 kg)   General: Well-nourished, well-appearing female in no acute distress.  She is unaccompanied today.   HEENT: Head is normocephalic.  Pupils equal and reactive to light. Conjunctivae clear without exudate.  Sclerae anicteric. Oral mucosa is pink, moist.  Oropharynx is pink without lesions or erythema.  Lymph: No cervical, supraclavicular, or infraclavicular lymphadenopathy noted on palpation.  Cardiovascular: Regular rate and rhythm.[name] Respiratory: Clear to auscultation bilaterally. Chest expansion symmetric; breathing non-labored.  Breasts: right breast s/p lumpectomy, mild amt of scar tissue present, minimal swelling noted, no nodules or masses, left breast without nodules, masses, skin or nipple changes. GI: Abdomen soft and round; non-tender, non-distended. Bowel sounds normoactive.  GU: Deferred.  Neuro: No focal deficits. Steady gait.  Psych: Mood and affect normal and appropriate for situation.  Extremities: No edema. MSK: No focal spinal tenderness to palpation.  Full range of motion in bilateral upper extremities Skin: Warm and dry.  LABORATORY DATA:  None for this visit.  DIAGNOSTIC IMAGING:  None for this visit.      ASSESSMENT AND PLAN:  Ms..  Mungin is a pleasant 67 y.o. female with Stage IA right breast invasive ductal carcinoma, ER+/PR+/HER2-, diagnosed in 05/2017, treated with lumpectomy, adjuvant radiation therapy, and anti-estrogen therapy with Letrozole beginning in 09/2017.  She presents to the Survivorship Clinic for our initial meeting and routine follow-up post-completion of treatment for breast cancer.    1. Stage IA right breast cancer:  [name] is continuing to recover from definitive treatment for breast cancer. She will follow-up with her medical oncologist, Dr. Lindi Adie in 6 months with history and physical exam per surveillance protocol.  She will continue her anti-estrogen therapy with Letrozole. Thus far, she is tolerating the Letrozole well, with minimal side effects. She was instructed to make Dr. Lindi Adie or myself aware if she begins to experience any worsening side effects of the medication and I could see her back in clinic to help manage those side effects, as needed. T Today, a comprehensive survivorship care plan and treatment summary was reviewed with the patient today detailing her breast cancer diagnosis, treatment course, potential late/long-term effects of treatment, appropriate follow-up care with recommendations for the future, and patient education resources.  A copy of this summary, along with a letter will be sent to the patient's primary care provider via mail/fax/In Basket message after today's visit.    2. Back pain: She will continue to f/u with her PCP about this.    3. Bone health:  Given [name] age/history of breast cancer and her  current treatment regimen including anti-estrogen therapy with Letrozole, she is at risk for bone demineralization.  She is overdue for bone density testing, so I have ordered this to be done with her mammogram that is due in November at her request.  She was given education on specific activities to promote bone health.  4. Cancer screening:  Due to [name]  history and her age, she should receive screening for skin cancers, colon cancer, and gynecologic cancers.  The information and recommendations are listed on the patient's comprehensive care plan/treatment summary and were reviewed in detail with the patient.    5. Health maintenance and wellness promotion: [name] was encouraged to consume 5-7 servings of fruits and vegetables per day. We reviewed the "Nutrition Rainbow" handout, as well as the handout "Take Control of Your Health and Reduce Your Cancer Risk" from the [facility].  She was also encouraged to engage in moderate to vigorous exercise for 30 minutes per day most days of the week. We discussed the [employer] fitness program, which is designed for cancer survivors to help them become more physically fit after cancer treatments.  She was instructed to limit her alcohol consumption and continue to abstain from tobacco use.     6. Support services/counseling: It is not uncommon for this period of the patient's cancer care trajectory to be one of many emotions and stressors.  We discussed an opportunity for her to participate in the next session of [facility] ("Finding Your New Normal") support group series designed for patients after they have completed treatment.   [name] was encouraged to take advantage of our many other support services programs, support groups, and/or counseling in coping with her new life as a cancer survivor after completing anti-cancer treatment.  She was offered support today through active listening and expressive supportive counseling.  She was given information regarding our available services and encouraged to contact me with any questions or for help enrolling in any of our support group/programs.    Dispo:   -Return to cancer center in 6 months for f/u with Dr. Lindi Adie  -Mammogram due in 04/2018 -Bone density in 04/2018 -She is welcome to return back to the Survivorship Clinic at any time; no  additional follow-up needed at this time.  -Consider referral back to survivorship as a long-term survivor for continued surveillance  A total of (30) minutes of face-to-face time was spent with this patient with greater than 50% of that time in counseling and care-coordination.   [name], [place] [phone]   Note: PRIMARY CARE PROVIDER [name], [place] [phone]

## 2018-01-19 NOTE — Telephone Encounter (Signed)
Per 7/26 los.  Referral for Bone density/mammo.

## 2018-01-31 ENCOUNTER — Other Ambulatory Visit: Payer: Self-pay

## 2018-01-31 DIAGNOSIS — I10 Essential (primary) hypertension: Secondary | ICD-10-CM

## 2018-01-31 DIAGNOSIS — Z760 Encounter for issue of repeat prescription: Secondary | ICD-10-CM

## 2018-01-31 DIAGNOSIS — F329 Major depressive disorder, single episode, unspecified: Secondary | ICD-10-CM

## 2018-01-31 DIAGNOSIS — F32A Depression, unspecified: Secondary | ICD-10-CM

## 2018-01-31 MED ORDER — TRAZODONE HCL 50 MG PO TABS: 50.0000 mg | ORAL_TABLET | Freq: Every evening | ORAL | 0 refills | Status: DC | PRN

## 2018-02-01 NOTE — Telephone Encounter (Signed)
Contacted pt to go over Dr. Wynetta Emery. Pt states she is taking both hctz and wellbutrin but she had ran out.

## 2018-02-04 ENCOUNTER — Other Ambulatory Visit: Payer: Self-pay | Admitting: Internal Medicine

## 2018-02-04 DIAGNOSIS — I10 Essential (primary) hypertension: Secondary | ICD-10-CM

## 2018-02-04 DIAGNOSIS — F32A Depression, unspecified: Secondary | ICD-10-CM

## 2018-02-04 DIAGNOSIS — F329 Major depressive disorder, single episode, unspecified: Secondary | ICD-10-CM

## 2018-02-04 MED ORDER — BUPROPION HCL ER (XL) 150 MG PO TB24: 150.0000 mg | ORAL_TABLET | Freq: Every day | ORAL | 1 refills | Status: DC

## 2018-02-04 MED ORDER — HYDROCHLOROTHIAZIDE 25 MG PO TABS: 25.0000 mg | ORAL_TABLET | Freq: Every day | ORAL | 1 refills | Status: DC

## 2018-03-14 ENCOUNTER — Telehealth: Payer: Self-pay | Admitting: Hematology and Oncology

## 2018-03-14 NOTE — Telephone Encounter (Signed)
Faxed medical records to [facility], Release ID: [id number]

## 2018-04-06 ENCOUNTER — Encounter: Payer: Self-pay | Admitting: Internal Medicine

## 2018-04-06 ENCOUNTER — Ambulatory Visit: Payer: Medicare PPO | Attending: Internal Medicine | Admitting: Internal Medicine

## 2018-04-06 VITALS — BP 156/82 | HR 86 | Temp 98.2°F | Resp 16 | Wt 234.8 lb

## 2018-04-06 DIAGNOSIS — J449 Chronic obstructive pulmonary disease, unspecified: Secondary | ICD-10-CM | POA: Insufficient documentation

## 2018-04-06 DIAGNOSIS — E119 Type 2 diabetes mellitus without complications: Secondary | ICD-10-CM

## 2018-04-06 DIAGNOSIS — I25810 Atherosclerosis of coronary artery bypass graft(s) without angina pectoris: Secondary | ICD-10-CM

## 2018-04-06 DIAGNOSIS — Z23 Encounter for immunization: Secondary | ICD-10-CM | POA: Insufficient documentation

## 2018-04-06 DIAGNOSIS — E785 Hyperlipidemia, unspecified: Secondary | ICD-10-CM | POA: Insufficient documentation

## 2018-04-06 DIAGNOSIS — Z7984 Long term (current) use of oral hypoglycemic drugs: Secondary | ICD-10-CM | POA: Insufficient documentation

## 2018-04-06 DIAGNOSIS — M545 Low back pain, unspecified: Secondary | ICD-10-CM

## 2018-04-06 DIAGNOSIS — M549 Dorsalgia, unspecified: Secondary | ICD-10-CM | POA: Insufficient documentation

## 2018-04-06 DIAGNOSIS — Z7982 Long term (current) use of aspirin: Secondary | ICD-10-CM | POA: Insufficient documentation

## 2018-04-06 DIAGNOSIS — Z8249 Family history of ischemic heart disease and other diseases of the circulatory system: Secondary | ICD-10-CM | POA: Insufficient documentation

## 2018-04-06 DIAGNOSIS — I2510 Atherosclerotic heart disease of native coronary artery without angina pectoris: Secondary | ICD-10-CM | POA: Insufficient documentation

## 2018-04-06 DIAGNOSIS — Z853 Personal history of malignant neoplasm of breast: Secondary | ICD-10-CM | POA: Insufficient documentation

## 2018-04-06 DIAGNOSIS — G8929 Other chronic pain: Secondary | ICD-10-CM

## 2018-04-06 DIAGNOSIS — N183 Chronic kidney disease, stage 3 (moderate): Secondary | ICD-10-CM | POA: Insufficient documentation

## 2018-04-06 DIAGNOSIS — Z79899 Other long term (current) drug therapy: Secondary | ICD-10-CM | POA: Insufficient documentation

## 2018-04-06 DIAGNOSIS — I129 Hypertensive chronic kidney disease with stage 1 through stage 4 chronic kidney disease, or unspecified chronic kidney disease: Secondary | ICD-10-CM | POA: Insufficient documentation

## 2018-04-06 DIAGNOSIS — F329 Major depressive disorder, single episode, unspecified: Secondary | ICD-10-CM | POA: Insufficient documentation

## 2018-04-06 DIAGNOSIS — Z8673 Personal history of transient ischemic attack (TIA), and cerebral infarction without residual deficits: Secondary | ICD-10-CM | POA: Insufficient documentation

## 2018-04-06 DIAGNOSIS — E1122 Type 2 diabetes mellitus with diabetic chronic kidney disease: Secondary | ICD-10-CM | POA: Insufficient documentation

## 2018-04-06 DIAGNOSIS — Z951 Presence of aortocoronary bypass graft: Secondary | ICD-10-CM | POA: Insufficient documentation

## 2018-04-06 DIAGNOSIS — Z87891 Personal history of nicotine dependence: Secondary | ICD-10-CM | POA: Insufficient documentation

## 2018-04-06 DIAGNOSIS — I10 Essential (primary) hypertension: Secondary | ICD-10-CM | POA: Diagnosis not present

## 2018-04-06 LAB — POCT GLYCOSYLATED HEMOGLOBIN (HGB A1C): HbA1c, POC (controlled diabetic range): 7.8 % — AB (ref 0.0–7.0)

## 2018-04-06 LAB — GLUCOSE, POCT (MANUAL RESULT ENTRY): POC Glucose: 162 mg/dl — AB (ref 70–99)

## 2018-04-06 MED ORDER — DICLOFENAC SODIUM 1 % TD GEL: 2.0000 g | Freq: Four times a day (QID) | TRANSDERMAL | 1 refills | Status: DC | PRN

## 2018-04-06 MED FILL — DICLOFENAC SODIUM 1% GEL: 1 | 12 days supply | Qty: 100 | Fill #0

## 2018-04-06 NOTE — Progress Notes (Signed)
cbg- [id number] a1c-7.8

## 2018-04-06 NOTE — Progress Notes (Signed)
Patient ID: [name], female    DOB: 18-May-1951  MRN: [id number]  CC: Diabetes and Hypertension   Subjective: [name] is a 67 y.o. female who presents for chronic ds management. Her concerns today include:  Pt with hx of HTN, DM, HL, depression, chronic back pain, RT breast CA (intraductal CA, ER/PR+, lumpectomy and XRT - completed 08/2017), CKD stage 3, CVA 2011 (residual weakness on LT side), CAD s/p CABG andMVR/resection of fibroelastoma, IDA  DM:  She stopped eating sweets and less bread.  Occasional "I eat something sweet."   Med:  Compliant with Trajenta, Metformin and Glipizide Not "checking as often as I should."  Checks about 2 x a wk.  Gives range of 140-160.  Not getting in much exercise due to back pain No blurred vision.  Over due for eye exam  But does not recall the name of the eye doctor she always goes to  Chronic Lower back pain:  Tylenol helps but "my back hurts every day, "like my body is about to split in half."     -Pain does not radiate.  No loss of bowel or bladder function.  No numbness or tingling in the legs.  She denies any weakness in the legs.  She has not had any recent falls.   Requesting form completed for handicap sticker -had laminectomy 2016 for slip disc. States she was on Oxycodone then Hydrocodone for a while post surgery.    HTN:  Did not take meds as yet for the a.m. reports compliance with medications. Trying to limit salt in the foods  CAD/VHD:  No CP/SOB/LE edema/PND/orthopnea.  Compliant with medications and aspirin.  HM:  Has appt for MMG and bone density scheduled for 05/28/2018.  Due for eye exam but does not recall name of eye doctor Patient Active Problem List   Diagnosis Date Noted  . Genetic testing 09/25/2017  . Family history of breast cancer   . Family history of prostate cancer   . Malignant neoplasm of upper-outer quadrant of right breast in female, estrogen receptor positive ([place]) 06/26/2017  . Lumbar  herniated disc 04/15/2015  . Abnormal nuclear stress test 04/03/2015  . 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 CAD (coronary artery disease), native coronary artery 03/30/2015  . COPD (chronic obstructive pulmonary disease) ([place]) 12/24/2013  . Acute respiratory failure with hypoxia ([place]) 02/16/2013  . S/P mitral valve repair 11/16/2010  . Dyslipidemia 06/29/2010  . Diabetes ([place]) 09/29/2006  . ANEMIA-IRON DEFICIENCY 09/29/2006  . Essential hypertension 09/29/2006     Current Outpatient Medications on File Prior to Visit  Medication Sig Dispense Refill  . ACCU-CHEK SOFTCLIX LANCETS lancets Use as instructed 100 each 12  . acetaminophen (TYLENOL) 500 MG tablet Take 1 tablet (500 mg total) by mouth every 8 (eight) hours as needed. 100 tablet 1  . albuterol (PROVENTIL HFA;VENTOLIN HFA) 108 (90 Base) MCG/ACT inhaler Inhale 2 puffs into the lungs every 6 (six) hours as needed for wheezing or shortness of breath. 1 Inhaler 2  . amLODipine (NORVASC) 10 MG tablet Take 1 tablet (10 mg total) by mouth daily. 30 tablet 0  . aspirin EC 81 MG tablet Take 1 tablet (81 mg total) by mouth daily. 30 tablet 5  . atorvastatin (LIPITOR) 40 MG tablet Take 1 tablet (40 mg total) by mouth daily. 90 tablet 2  . Blood Glucose Monitoring Suppl (ACCU-CHEK AVIVA PLUS) w/Device KIT Use as directed 1 kit 0  . buPROPion (WELLBUTRIN XL) 150  MG 24 hr tablet Take 1 tablet (150 mg total) by mouth daily. 90 tablet 1  . carvedilol (COREG) 6.25 MG tablet Take 1 tablet (6.25 mg total) by mouth 2 (two) times daily with a meal. 180 tablet 3  . diclofenac sodium (VOLTAREN) 1 % GEL Apply 2 g topically 4 (four) times daily as needed. (Patient taking differently: Apply 2 g topically 4 (four) times daily as needed (pain). ) 1 Tube 1  . ferrous sulfate (FERROUSUL) 325 (65 FE) MG tablet Take 1 tablet (325 mg total) by mouth daily with breakfast. 90 tablet 0  . glipiZIDE (GLUCOTROL) 5 MG tablet Take 1 tablet (5 mg total) by mouth  daily before breakfast. 90 tablet 3  . glucose blood (ACCU-CHEK AVIVA PLUS) test strip Use as instructed 100 each 12  . hydrochlorothiazide (HYDRODIURIL) 25 MG tablet Take 1 tablet (25 mg total) by mouth daily. 90 tablet 1  . letrozole (FEMARA) 2.5 MG tablet Take 1 tablet (2.5 mg total) by mouth daily. 90 tablet 3  . linagliptin (TRADJENTA) 5 MG TABS tablet Take 5 mg by mouth daily.    [name] losartan (COZAAR) 100 MG tablet Take 1 tablet (100 mg total) by mouth daily. 90 tablet 1  . metFORMIN (GLUCOPHAGE) 1000 MG tablet Take 1 tablet (1,000 mg total) by mouth 2 (two) times daily with a meal. 60 tablet 0  . pantoprazole (PROTONIX) 40 MG tablet Take 1 tablet (40 mg total) by mouth daily. 90 tablet 3  . polyethylene glycol (MIRALAX / GLYCOLAX) packet Take 17 g by mouth daily as needed for mild constipation or moderate constipation. 30 each 2  . traZODone (DESYREL) 50 MG tablet Take 1 tablet (50 mg total) by mouth at bedtime as needed for sleep. 90 tablet 0   No current facility-administered medications on file prior to visit.     No Known Allergies  Social History   Socioeconomic History  . Marital status: Married    Spouse name: Not on file  . Number of children: 3  . Years of education: Not on file  . Highest education level: Not on file  Occupational History    Employer: UNEMPLOYED  Social Needs  . Financial resource strain: Not on file  . Food insecurity:    Worry: Not on file    Inability: Not on file  . Transportation needs:    Medical: Not on file    Non-medical: Not on file  Tobacco Use  . Smoking status: Former Smoker    Last attempt to quit: 10/14/2010    Years since quitting: 7.4  . Smokeless tobacco: Never Used  Substance and Sexual Activity  . Alcohol use: No  . Drug use: No  . Sexual activity: Never  Lifestyle  . Physical activity:    Days per week: Not on file    Minutes per session: Not on file  . Stress: Not on file  Relationships  . Social connections:     Talks on phone: Not on file    Gets together: Not on file    Attends religious service: Not on file    Active member of club or organization: Not on file    Attends meetings of clubs or organizations: Not on file    Relationship status: Not on file  . Intimate partner violence:    Fear of current or ex partner: Not on file    Emotionally abused: Not on file    Physically abused: Not on file  Forced sexual activity: Not on file  Other Topics Concern  . Not on file  Social History Narrative   Lives with husband, stay at home, uses cane occasionally, still active/ambulatory.     Family History  Problem Relation Age of Onset  . Colon cancer Mother        dx 68s; deceased 39  . Irritable bowel syndrome Mother   . Prostate cancer Father        deceased 40s  . Cirrhosis Sister        died of GI bleed associated with cirrhosis of the liver  . Stroke Brother   . Prostate cancer Brother 18       deceased 82  . Diabetes Brother        x3  . Kidney disease Brother   . Cancer Brother 21       unk. type  . Breast cancer Cousin        several maternal 1st and 2nd cousins with breast cancer    Past Surgical History:  Procedure Laterality Date  . BREAST LUMPECTOMY WITH RADIOACTIVE SEED AND SENTINEL LYMPH NODE BIOPSY Right 07/20/2017   Procedure: RIGHT BREAST LUMPECTOMY WITH RADIOACTIVE SEED AND SENTINEL LYMPH NODE BIOPSY;  Surgeon: Alphonsa Overall, MD;  Location: [facility];  Service: General;  Laterality: Right;  . CARDIAC CATHETERIZATION  04/03/2015   Procedure: Left Heart Cath and Cors/Grafts Angiography;  Surgeon: Peter M Martinique, MD;  Location: [facility];  Service: Cardiovascular;;  . CORONARY ARTERY BYPASS GRAFT  1/12  . history of ankle fractures requiring surgery    . LUMBAR LAMINECTOMY/DECOMPRESSION MICRODISCECTOMY Left 04/15/2015   Procedure: LUMBAR LAMINECTOMY/DECOMPRESSION MICRODISCECTOMY 1 LEVEL;  Surgeon: Newman Pies, MD;  Location: [facility];  Service:  Neurosurgery;  Laterality: Left;  Left L23 microdiskectomy  . MV repair and resection of mass  1/12  . TONSILLECTOMY  1971  . TOTAL ABDOMINAL HYSTERECTOMY  1978  . VESICOVAGINAL FISTULA CLOSURE W/ TAH      ROS: Review of Systems Negative except as above. PHYSICAL EXAM: BP (!) 156/82   Pulse 86   Temp 98.2 F (36.8 C) (Oral)   Resp 16   Wt 234 lb 12.8 oz (106.5 kg)   SpO2 98%   BMI 35.70 kg/m   Physical Exam  General appearance - alert, well appearing, and in no distress Mental status - normal mood, behavior, speech, dress, motor activity, and thought processes Neck - supple, no significant adenopathy Chest - clear to auscultation, no wheezes, rales or rhonchi, symmetric air entry Heart - normal rate, regular rhythm, normal S1, S2, no murmurs, rubs, clicks or gallops Extremities - peripheral pulses normal, no pedal edema, no clubbing or cyanosis Diabetic Foot Exam - Simple   Simple Foot Form Visual Inspection No deformities, no ulcerations, no other skin breakdown bilaterally:  Yes Sensation Testing Intact to touch and monofilament testing bilaterally:  Yes Pulse Check Posterior Tibialis and Dorsalis pulse intact bilaterally:  Yes Comments     BS 162/ A1C 7.8  ASSESSMENT AND PLAN: 1. Type 2 diabetes mellitus without complication, without long-term current use of insulin (HCC) A1c has improved since last visit.  I would like to try to get her closer to 7.  We discussed increasing 1 of her medications versus patient working on continuing to improve eating habits and trying to move more.  She opted for the latter.  So she will continue current dose of metformin, glipizide and Tradjenta for now. Patient advised to  schedule an appointment to get her yearly eye exam done. - POCT glucose (manual entry) - POCT glycosylated hemoglobin (Hb A1C) - Microalbumin / creatinine urine ratio  2. Need for immunization against influenza - Flu Vaccine QUAD 36+ mos IM  3. Essential  hypertension Not at goal.  She has not taken her meds for today.  4. Chronic midline low back pain without sciatica We will get an updated x-ray of the lower back. Prescribed Voltaren gel to use as needed.  Patient told that she can take the Tylenol every 8 hours as needed To consider referral for physical therapy versus orthopedics once we get the results of x-rays. I will see her back in about 6 weeks to see how she is doing - DG Lumbar Spine Complete; Future - diclofenac sodium (VOLTAREN) 1 % GEL; Apply 2 g topically 4 (four) times daily as needed.  Dispense: 100 g; Refill: 1  5. Coronary artery disease involving coronary bypass graft of native heart without angina pectoris Clinically stable.  Continue Lipitor, carvedilol, aspirin and losartan.   Patient was given the opportunity to ask questions.  Patient verbalized understanding of the plan and was able to repeat key elements of the plan.   No orders of the defined types were placed in this encounter.    Requested Prescriptions    No prescriptions requested or ordered in this encounter    No follow-ups on file.  Karle Plumber, MD, FACP

## 2018-04-07 LAB — MICROALBUMIN / CREATININE URINE RATIO
Creatinine, Urine: 142.7 mg/dL
Microalb/Creat Ratio: 506.4 mg/g creat — ABNORMAL HIGH (ref 0.0–30.0)
Microalbumin, Urine: 722.7 ug/mL

## 2018-04-08 ENCOUNTER — Encounter: Payer: Self-pay | Admitting: Internal Medicine

## 2018-04-08 DIAGNOSIS — R809 Proteinuria, unspecified: Secondary | ICD-10-CM | POA: Insufficient documentation

## 2018-04-09 ENCOUNTER — Telehealth: Payer: Self-pay

## 2018-04-09 NOTE — Telephone Encounter (Signed)
Contacted pt to go over lab results pt is aware of results and doesn't have any questions or concerns 

## 2018-05-04 ENCOUNTER — Other Ambulatory Visit: Payer: Self-pay | Admitting: Internal Medicine

## 2018-05-04 DIAGNOSIS — Z760 Encounter for issue of repeat prescription: Secondary | ICD-10-CM

## 2018-05-15 ENCOUNTER — Telehealth: Payer: Self-pay | Admitting: Internal Medicine

## 2018-05-15 NOTE — Telephone Encounter (Signed)
Patient called because she would like to speak to a nurse regarding head pain. Patient would like to know what she can take that is over the counter. Please follow up with patient.

## 2018-05-16 NOTE — Telephone Encounter (Signed)
Pt wanted advise on OTC medication for  dizziness, off balance and headache for x 3 -4 days. Blood sugar at home while on the phone is 76. Advised patient to eat food with CHO and protein and check blood sugar tonight before going to sleep.  She denies cough or runny nose.  She took all medication today and takes everyday.   Advised if dizziness  and headaches does not improve and blood sugars are better, to proceed for further evaluation to ED. Advised to call office tomorrow to see if there is any cancellations to for eval and medication management. Pt verbalized understanding.

## 2018-05-17 ENCOUNTER — Ambulatory Visit: Payer: Medicare PPO | Admitting: Internal Medicine

## 2018-05-22 NOTE — Telephone Encounter (Signed)
Phone call placed to patient this evening.  Patient reports that she checks her blood sugars in the mornings.  For the past 1 week blood sugars have been running low.  She read off her log to me.  These numbers were 76, 78, 70, 84, 79 and 94.  I advised patient to stop the glipizide and continue metformin and Tradjenta.  Advised to continue to check blood sugars every morning and call me back if levels continue to run low.  Patient expressed understanding.

## 2018-05-22 NOTE — Telephone Encounter (Signed)
Pt states her headache and dizziness has went away. Pt states she thinks it was because of her sugars being low. Pt states she is feeling better   Pt states she has been checking her sugar regularly. Pt states when she check her sugars and it's low she eats peanut butter

## 2018-05-28 ENCOUNTER — Ambulatory Visit
Admission: RE | Admit: 2018-05-28 | Discharge: 2018-05-28 | Disposition: A | Discharge status: Home / Self Care | Payer: Medicare PPO | Source: Ambulatory Visit | Attending: Adult Health | Admitting: Adult Health

## 2018-05-28 DIAGNOSIS — Z170 Estrogen receptor positive status [ER+]: Principal | ICD-10-CM

## 2018-05-28 DIAGNOSIS — R922 Inconclusive mammogram: Secondary | ICD-10-CM | POA: Diagnosis not present

## 2018-05-28 DIAGNOSIS — E2839 Other primary ovarian failure: Secondary | ICD-10-CM

## 2018-05-28 DIAGNOSIS — M85851 Other specified disorders of bone density and structure, right thigh: Secondary | ICD-10-CM | POA: Diagnosis not present

## 2018-05-28 DIAGNOSIS — Z780 Asymptomatic menopausal state: Secondary | ICD-10-CM | POA: Diagnosis not present

## 2018-05-28 DIAGNOSIS — Z853 Personal history of malignant neoplasm of breast: Secondary | ICD-10-CM | POA: Diagnosis not present

## 2018-05-28 DIAGNOSIS — C50411 Malignant neoplasm of upper-outer quadrant of right female breast: Secondary | ICD-10-CM

## 2018-05-28 HISTORY — DX: Personal history of irradiation: Z92.3

## 2018-05-29 ENCOUNTER — Telehealth: Payer: Self-pay

## 2018-05-29 ENCOUNTER — Other Ambulatory Visit: Payer: Self-pay | Admitting: Internal Medicine

## 2018-05-29 NOTE — Telephone Encounter (Signed)
Spoke with patient regarding BD results and NP recommendations.  Patient wrote down info.  She voiced understanding and had no further questions/concerns at this time.

## 2018-05-29 NOTE — Telephone Encounter (Signed)
-----   Message from Gardenia Phlegm, NP sent at 05/29/2018  8:19 AM EST ----- Bone density is consistent with osteopenia, or thinning of the bones.  Recommend calcium, vitamin d and weight bearing exercises.  She can review further with Dr. Lindi Adie at her next appointment with him.    Thanks,   Joiner ----- Message ----- From: Interface, Rad Results In Sent: 05/28/2018   3:23 PM EST To: Gardenia Phlegm, NP

## 2018-05-30 ENCOUNTER — Other Ambulatory Visit: Payer: Self-pay

## 2018-05-30 DIAGNOSIS — I10 Essential (primary) hypertension: Secondary | ICD-10-CM

## 2018-05-30 MED ORDER — LOSARTAN POTASSIUM 100 MG PO TABS: 100.0000 mg | ORAL_TABLET | Freq: Every day | ORAL | 0 refills | Status: DC

## 2018-06-01 ENCOUNTER — Other Ambulatory Visit: Payer: Self-pay

## 2018-06-01 DIAGNOSIS — E1165 Type 2 diabetes mellitus with hyperglycemia: Secondary | ICD-10-CM

## 2018-06-01 MED ORDER — METFORMIN HCL 1000 MG PO TABS: 1000.0000 mg | ORAL_TABLET | Freq: Two times a day (BID) | ORAL | 0 refills | Status: DC

## 2018-06-13 ENCOUNTER — Other Ambulatory Visit: Payer: Self-pay

## 2018-06-13 DIAGNOSIS — I10 Essential (primary) hypertension: Secondary | ICD-10-CM

## 2018-06-13 MED ORDER — AMLODIPINE BESYLATE 10 MG PO TABS: 10.0000 mg | ORAL_TABLET | Freq: Every day | ORAL | 2 refills | Status: DC

## 2018-06-13 NOTE — Telephone Encounter (Signed)
[employer] is requesting a 90 day refill for Amlodipine 10mg  according to Epic her last Rx was from 07/10/17 and was only for 30 tablets. At the patients last appt on 04/06/18 her BP was 156/82, please authorize this refill if appropriate.

## 2018-07-19 NOTE — Progress Notes (Addendum)
Patient Care Team: [name], MD as PCP - General (Internal Medicine) Nicholas Lose, MD as Consulting Physician (Hematology and Oncology) Kyung Rudd, MD as Consulting Physician (Radiation Oncology) Gardenia Phlegm, NP as Nurse Practitioner (Hematology and Oncology) Alphonsa Overall, MD as Consulting Physician (General Surgery)  DIAGNOSIS: stage IA right sided breast cancer  SUMMARY OF ONCOLOGIC HISTORY:   Malignant neoplasm of upper-outer quadrant of right breast in female, estrogen receptor positive ([place])   06/12/2017 Initial Diagnosis    Right breast biopsy 10:00: IDC grade 2, ER 95%, PR 30%, Ki-67 15%, HER-2 negative ratio 1.36, 1.7 cm mass in the right upper outer quadrant, T1CN0 stage I a clinical stage    07/21/2017 Surgery    Rt Lumpectomy: Grade 2 IDC 1.7 cm, 0/2 LN Neg, Margins Neg, ER 95%, PR 30%, Ki-67 15%, HER-2 negative ratio 1.36 T1cN0 Stage 1A    08/22/2017 - 09/15/2017 Radiation Therapy    Adjuvant radiation therapy    08/23/2017 Oncotype testing    Oncotype DX recurrence score 14: Risk of distant recurrence of 9 years: 4%, low risk    10/19/2017 -  Anti-estrogen oral therapy    Letrozole daily     CHIEF COMPLIANT: Follow-up of antiestrogen therapy Letrozole  INTERVAL HISTORY: [name] is a 68 y.o. with above-mentioned history of right breast cancer treated with lumpectomy and radiation and is currently on anti-estrogen therapy with letrozole. She continues to tolerate it well.  Her most recent mammogram from 05/28/18 showed no evidence of malignancy bilaterally. She also underwent bone density testing at that time that showed osteopenia with a t score of -1.7.  She has continued on Calcium, vitamin d and understands the importance of weight bearing exercises.    [name] is feeling unwell today.  Since Sunday she has had tiredness and body aches.  She has had chills, but did not know she had a fever until she came in today.  She notes an  occasional dry cough, but does have some nasal drainage.  She denies any shortness of breath, chest pain, palpitations, or any other concerns.     REVIEW OF SYSTEMS:   Review of Systems  Constitutional: Positive for chills, fatigue and fever. Negative for appetite change and unexpected weight change.  HENT:   Negative for hearing loss, lump/mass and sore throat.   Eyes: Negative for eye problems and icterus.  Respiratory: Positive for cough. Negative for chest tightness, shortness of breath and wheezing.   Cardiovascular: Negative for chest pain, leg swelling and palpitations.  Gastrointestinal: Negative for abdominal distention, abdominal pain, constipation, diarrhea, nausea and vomiting.  Endocrine: Negative for hot flashes.  Genitourinary: Negative for difficulty urinating.   Musculoskeletal: Positive for arthralgias.  Skin: Negative for itching and rash.  Neurological: Positive for headaches.  Hematological: Negative for adenopathy. Does not bruise/bleed easily.  Psychiatric/Behavioral: Negative for depression. The patient is not nervous/anxious.   Breast: no breast changes bilaterally   I have reviewed the past medical history, past surgical history, social history and family history with the patient and they are unchanged from previous note.  ALLERGIES:  has No Known Allergies.  MEDICATIONS:  Current Outpatient Medications  Medication Sig Dispense Refill  . ACCU-CHEK SOFTCLIX LANCETS lancets Use as instructed 100 each 12  . acetaminophen (TYLENOL) 500 MG tablet Take 1 tablet (500 mg total) by mouth every 8 (eight) hours as needed. 100 tablet 1  . albuterol (PROVENTIL HFA;VENTOLIN HFA) 108 (90 Base) MCG/ACT inhaler Inhale 2 puffs into  the lungs every 6 (six) hours as needed for wheezing or shortness of breath. 1 Inhaler 2  . amLODipine (NORVASC) 10 MG tablet Take 1 tablet (10 mg total) by mouth daily. 90 tablet 2  . aspirin EC 81 MG tablet Take 1 tablet (81 mg total) by mouth  daily. 30 tablet 5  . atorvastatin (LIPITOR) 40 MG tablet Take 1 tablet (40 mg total) by mouth daily. 90 tablet 2  . Blood Glucose Monitoring Suppl (ACCU-CHEK AVIVA PLUS) w/Device KIT Use as directed 1 kit 0  . buPROPion (WELLBUTRIN XL) 150 MG 24 hr tablet Take 1 tablet (150 mg total) by mouth daily. 90 tablet 1  . carvedilol (COREG) 6.25 MG tablet Take 1 tablet (6.25 mg total) by mouth 2 (two) times daily with a meal. 180 tablet 3  . diclofenac sodium (VOLTAREN) 1 % GEL Apply 2 g topically 4 (four) times daily as needed. 100 g 1  . FEROSUL 325 (65 Fe) MG tablet TAKE 1 TABLET (325 MG TOTAL) BY MOUTH DAILY WITH BREAKFAST. 90 tablet 0  . glucose blood (ACCU-CHEK AVIVA PLUS) test strip Use as instructed 100 each 12  . hydrochlorothiazide (HYDRODIURIL) 25 MG tablet Take 1 tablet (25 mg total) by mouth daily. 90 tablet 1  . letrozole (FEMARA) 2.5 MG tablet Take 1 tablet (2.5 mg total) by mouth daily. 90 tablet 3  . linagliptin (TRADJENTA) 5 MG TABS tablet Take 5 mg by mouth daily.    [name] losartan (COZAAR) 100 MG tablet Take 1 tablet (100 mg total) by mouth daily. 90 tablet 0  . metFORMIN (GLUCOPHAGE) 1000 MG tablet Take 1 tablet (1,000 mg total) by mouth 2 (two) times daily with a meal. 180 tablet 0  . pantoprazole (PROTONIX) 40 MG tablet Take 1 tablet (40 mg total) by mouth daily. 90 tablet 3  . polyethylene glycol (MIRALAX / GLYCOLAX) packet Take 17 g by mouth daily as needed for mild constipation or moderate constipation. 30 each 2  . traZODone (DESYREL) 50 MG tablet TAKE 1 TABLET AT BEDTIME AS NEEDED FOR SLEEP 90 tablet 0   No current facility-administered medications for this visit.     PHYSICAL EXAMINATION: ECOG PERFORMANCE STATUS: 2 - Symptomatic, <50% confined to bed  Vitals:   07/24/18 1109  BP: 124/89  Pulse: 90  Resp: 16  Temp: (!) 102 F (38.9 C)  SpO2: 98%   Filed Weights   07/24/18 1109  Weight: 236 lb 11.2 oz (107.4 kg)   GENERAL: Patient is non toxic appearing, but  obviously tired and unwell feeling HEENT:  Sclerae anicteric.  Oropharynx clear and moist. No ulcerations or evidence of oropharyngeal candidiasis. Neck is supple.  NODES:  No cervical, supraclavicular, or axillary lymphadenopathy palpated.  BREAST EXAM:  Right breast s/p lumpectomy, no sign of recurrence, left breast benign LUNGS:  Clear to auscultation bilaterally.  No wheezes or rhonchi. HEART:  Regular rate and rhythm. No murmur appreciated. ABDOMEN:  Soft, nontender.  Positive, normoactive bowel sounds. No organomegaly palpated. MSK:  No focal spinal tenderness to palpation. Full range of motion bilaterally in the upper extremities. EXTREMITIES:  No peripheral edema.   SKIN:  Clear with no obvious rashes or skin changes. No nail dyscrasia. NEURO:  Nonfocal. Well oriented.  Appropriate affect.    LABORATORY DATA:  I have reviewed the data as listed CMP Latest Ref Rng & Units 11/09/2017 07/14/2017 09/26/2016  Glucose 65 - 99 mg/dL 196(H) 189(H) 196(H)  BUN 8 - 27 mg/dL 15 22(H) 26  Creatinine 0.57 - 1.00 mg/dL 1.42(H) 1.40(H) 1.17(H)  Sodium 134 - 144 mmol/L 138 139 139  Potassium 3.5 - 5.2 mmol/L 4.8 4.4 4.4  Chloride 96 - 106 mmol/L 100 105 101  CO2 20 - 29 mmol/L _0 Calcium 8.7 - 10.3 mg/dL 9.6 9.5 9.1  Total Protein 6.0 - 8.5 g/dL 6.4 - 6.8  Total Bilirubin 0.0 - 1.2 mg/dL 0.4 - 0.3  Alkaline Phos 39 - 117 IU/L 70 - 66  AST 0 - 40 IU/L 14 - 19  ALT 0 - 32 IU/L 13 - 11    Lab Results  Component Value Date   WBC 5.1 11/09/2017   HGB 11.4 11/09/2017   HCT 35.4 11/09/2017   MCV 80 11/09/2017   PLT 223 11/09/2017   NEUTROABS 4,697 04/28/2016    ASSESSMENT & PLAN:  Malignant neoplasm of upper-outer quadrant of right breast in female, estrogen receptor positive ([place]) 07/21/17: Rt Lumpectomy: Grade 2 IDC 1.7 cm, 0/2 LN Neg, Margins Neg, ER 95%, PR 30%, Ki-67 15%, HER-2 negative ratio 1.36 T1cN0 Stage 1A  Pathology counseling: I discussed the final pathology report of  the patient provided  a copy of this report. I discussed the margins as well as lymph node surgeries. We also discussed the final staging along with previously performed ER/PR and HER-2/neu testing. Oncotype DX recurrence score 14: Risk of distant recurrence of 9 years: 4%, low risk Adjuvant radiation 08/22/2017-09/18/2017 Adjuvant Radiation: 08/22/2017-09/15/2017 Adjuvant antiestrogen therapy with letrozole 2.5 mg daily started on 10/19/2017 _____________________________________________________________________________  [name] has no clinical or radiographic sign of breast cancer recurrence.  She continues on Letrozole daily and is tolerating it well.  She will continue this.  She will undergo mammogram in 05/2019 when due and will have bone density testing in 05/2020 when due.  We again reviewed bone health.  I suggested she ensure that she sees her surgeon in 6 months.  We will see her back in one year after her mammogram.    [name] is not feeling well today.  This is not related to her breast cancer.  She has symptoms and physical exam findings concerning for a viral illness such as the flu.  I swabbed her for the flu and we will call her with the results.  Reviewed supportive care for viral illnesses.  I gave her a mask to wear and escorted her out of the building via the side door to avoid exposing other patients.  I reviewed red flags with her and her husband to seek further care or emergent care.        The patient has a good understanding of the overall plan. she agrees with it. she will call with any problems that may develop before the next visit here.  A total of (30) minutes of face-to-face time was spent with this patient with greater than 50% of that time in counseling and care-coordination.   Wilber Bihari, NP 07/24/2018 1200

## 2018-07-24 ENCOUNTER — Encounter: Payer: Self-pay | Admitting: Adult Health

## 2018-07-24 ENCOUNTER — Inpatient Hospital Stay: Payer: Medicare PPO | Attending: Hematology and Oncology | Admitting: Adult Health

## 2018-07-24 ENCOUNTER — Other Ambulatory Visit: Payer: Self-pay

## 2018-07-24 ENCOUNTER — Ambulatory Visit: Payer: Medicare PPO

## 2018-07-24 ENCOUNTER — Telehealth: Payer: Self-pay | Admitting: Adult Health

## 2018-07-24 VITALS — BP 124/89 | HR 90 | Temp 102.0°F | Resp 16 | Ht 68.0 in | Wt 236.7 lb

## 2018-07-24 DIAGNOSIS — Z79811 Long term (current) use of aromatase inhibitors: Secondary | ICD-10-CM | POA: Diagnosis not present

## 2018-07-24 DIAGNOSIS — Z923 Personal history of irradiation: Secondary | ICD-10-CM | POA: Diagnosis not present

## 2018-07-24 DIAGNOSIS — Z170 Estrogen receptor positive status [ER+]: Secondary | ICD-10-CM | POA: Diagnosis not present

## 2018-07-24 DIAGNOSIS — Z79899 Other long term (current) drug therapy: Secondary | ICD-10-CM | POA: Diagnosis not present

## 2018-07-24 DIAGNOSIS — C50411 Malignant neoplasm of upper-outer quadrant of right female breast: Secondary | ICD-10-CM | POA: Insufficient documentation

## 2018-07-24 DIAGNOSIS — R509 Fever, unspecified: Secondary | ICD-10-CM

## 2018-07-24 LAB — INFLUENZA PANEL BY PCR (TYPE A & B)
INFLBPCR: NEGATIVE
Influenza A By PCR: NEGATIVE

## 2018-07-24 NOTE — Assessment & Plan Note (Addendum)
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 I discussed the margins as well as lymph node surgeries. We also discussed the final staging along with previously performed ER/PR and HER-2/neu testing. Oncotype DX recurrence score 14: Risk of distant recurrence of 9 years: 4%, low risk Adjuvant radiation 08/22/2017-09/18/2017 Adjuvant Radiation: 08/22/2017-09/15/2017 Adjuvant antiestrogen therapy with letrozole 2.5 mg daily started on 10/19/2017 _____________________________________________________________________________  [name] has no clinical or radiographic sign of breast cancer recurrence.  She continues on Letrozole daily and is tolerating it well.  She will continue this.  She will undergo mammogram in 05/2019 when due and will have bone density testing in 05/2020 when due.  We again reviewed bone health.  I suggested she ensure that she sees her surgeon in 6 months.  We will see her back in one year after her mammogram.    [name] is not feeling well today.  This is not related to her breast cancer.  She has symptoms and physical exam findings concerning for a viral illness such as the flu.  I swabbed her for the flu and we will call her with the results.  Reviewed supportive care for viral illnesses.  I gave her a mask to wear and escorted her out of the building via the side door to avoid exposing other patients.  I reviewed red flags with her and her husband to seek further care or emergent care.

## 2018-07-24 NOTE — Progress Notes (Signed)
Pt febrile with temp 102. Pt not feeling well today and has some cough and chills. Per Lindsey,NP obtain flu swab today.

## 2018-07-24 NOTE — Telephone Encounter (Signed)
Called and informed patient of flu test results that were negative.  Let her know that if she develops any worsening symptoms or new symptoms, or if she doesn't improve to see PCP or urgent care.  Red flags reviewed, she knows when to go to ER.  Patient voiced understanding and thanked me for the call.   Wilber Bihari, NP

## 2018-07-25 ENCOUNTER — Telehealth: Payer: Self-pay | Admitting: Adult Health

## 2018-07-25 NOTE — Telephone Encounter (Signed)
No los °

## 2018-08-10 ENCOUNTER — Encounter: Payer: Self-pay | Admitting: Internal Medicine

## 2018-08-10 ENCOUNTER — Ambulatory Visit: Payer: Medicare PPO | Attending: Internal Medicine | Admitting: Internal Medicine

## 2018-08-10 VITALS — BP 138/74 | HR 67 | Temp 98.3°F | Resp 16 | Ht 67.5 in | Wt 228.8 lb

## 2018-08-10 DIAGNOSIS — R269 Unspecified abnormalities of gait and mobility: Secondary | ICD-10-CM

## 2018-08-10 DIAGNOSIS — Z0000 Encounter for general adult medical examination without abnormal findings: Secondary | ICD-10-CM

## 2018-08-10 DIAGNOSIS — E119 Type 2 diabetes mellitus without complications: Secondary | ICD-10-CM | POA: Diagnosis not present

## 2018-08-10 DIAGNOSIS — Z23 Encounter for immunization: Secondary | ICD-10-CM

## 2018-08-10 MED ORDER — PNEUMOCOCCAL VAC POLYVALENT 25 MCG/0.5ML IJ INJ: 0.5000 mL | INJECTION | INTRAMUSCULAR | 0 refills | Status: AC

## 2018-08-10 MED FILL — PNEUMOVAX 23 VIAL: 25 | 1 days supply | Qty: 1 | Fill #0

## 2018-08-10 NOTE — Patient Instructions (Addendum)
I have given you a prescription for shower chair and toilet seat extender.  You can take this prescription to any medical supply store to have it filled.  Please review the information that I have given you about end-of-life decisions and consider putting together a living will and a medical power of attorney.  Once executed please bring me a copy for our records.  Pneumococcal Polysaccharide Vaccine: What You Need to Know 1. Why get vaccinated? Vaccination can protect older adults (and some children and younger adults) from pneumococcal disease. Pneumococcal disease is caused by bacteria that can spread from person to person through close contact. It can cause ear infections, and it can also lead to more serious infections of the:  Lungs (pneumonia),  Blood (bacteremia), and  Covering of the brain and spinal cord (meningitis). Meningitis can cause deafness and brain damage, and it can be fatal. Anyone can get pneumococcal disease, but children under 72 years of age, people with certain medical conditions, adults over 63 years of age, and cigarette smokers are at the highest risk. About 18,000 older adults die each year from pneumococcal disease in the [place]. Treatment of pneumococcal infections with penicillin and other drugs used to be more effective. But some strains of the disease have become resistant to these drugs. This makes prevention of the disease, through vaccination, even more important. 2. Pneumococcal polysaccharide vaccine (PPSV23) Pneumococcal polysaccharide vaccine (PPSV23) protects against 23 types of pneumococcal bacteria. It will not prevent all pneumococcal disease. PPSV23 is recommended for:  All adults 25 years of age and older,  Anyone 2 through 70 years of age with certain long-term health problems,  Anyone 2 through 70 years of age with a weakened immune system,  Adults 57 through 70 years of age who smoke cigarettes or have asthma. Most people need only  one dose of PPSV. A second dose is recommended for certain high-risk groups. People 29 and older should get a dose even if they have gotten one or more doses of the vaccine before they turned 65. Your healthcare provider can give you more information about these recommendations. Most healthy adults develop protection within 2 to 3 weeks of getting the shot. 3. Some people should not get this vaccine  Anyone who has had a life-threatening allergic reaction to PPSV should not get another dose.  Anyone who has a severe allergy to any component of PPSV should not receive it. Tell your provider if you have any severe allergies.  Anyone who is moderately or severely ill when the shot is scheduled may be asked to wait until they recover before getting the vaccine. Someone with a mild illness can usually be vaccinated.  Children less than 68 years of age should not receive this vaccine.  There is no evidence that PPSV is harmful to either a pregnant woman or to her fetus. However, as a precaution, women who need the vaccine should be vaccinated before becoming pregnant, if possible. 4. Risks of a vaccine reaction With any medicine, including vaccines, there is a chance of side effects. These are usually mild and go away on their own, but serious reactions are also possible. About half of people who get PPSV have mild side effects, such as redness or pain where the shot is given, which go away within about two days. Less than 1 out of 100 people develop a fever, muscle aches, or more severe local reactions. Problems that could happen after any vaccine:  People sometimes faint after a  medical procedure, including vaccination. Sitting or lying down for about 15 minutes can help prevent fainting, and injuries caused by a fall. Tell your doctor if you feel dizzy, or have vision changes or ringing in the ears.  Some people get severe pain in the shoulder and have difficulty moving the arm where a shot was  given. This happens very rarely.  Any medication can cause a severe allergic reaction. Such reactions from a vaccine are very rare, estimated at about 1 in a million doses, and would happen within a few minutes to a few hours after the vaccination. As with any medicine, there is a very remote chance of a vaccine causing a serious injury or death. The safety of vaccines is always being monitored. For more information, visit: http://www.aguilar.org/ 5. What if there is a serious reaction? What should I look for? Look for anything that concerns you, such as signs of a severe allergic reaction, very high fever, or unusual behavior. Signs of a severe allergic reaction can include hives, swelling of the face and throat, difficulty breathing, a fast heartbeat, dizziness, and weakness. These would usually start a few minutes to a few hours after the vaccination. What should I do? If you think it is a severe allergic reaction or other emergency that can't wait, call 9-1-1 or get to the nearest hospital. Otherwise, call your doctor. Afterward, the reaction should be reported to the Vaccine Adverse Event Reporting System (VAERS). Your doctor might file this report, or you can do it yourself through the VAERS web site at www.vaers.SamedayNews.es, or by calling 518-794-0156. VAERS does not give medical advice. 6. How can I learn more?  Ask your doctor. He or she can give you the vaccine package insert or suggest other sources of information.  Call your local or state health department.  Contact the Centers for Disease Control and Prevention (CDC): ? Call 949 161 6728 (1-800-CDC-INFO) or ? Visit CDC's website at http://hunter.com/ CDC Vaccine Information Statement PPSV Vaccine (10/18/2013) This information is not intended to replace advice given to you by your health care provider. Make sure you discuss any questions you have with your health care provider. Document Released: 04/10/2006 Document Revised:  01/23/2018 Document Reviewed: 01/23/2018 Elsevier Interactive Patient Education  2019 [employer].

## 2018-08-10 NOTE — Progress Notes (Signed)
Patient ID: [name], female    DOB: 03/26/1951  MRN: [id number]   Annual Wellness Visit  [name] is a 68 y.o. Female who presents for an Annual Wellness Visit.  Her spouse is with her. Pt with hx of HTN, DM, HL, depression, chronic back pain, RT breast CA (intraductal CA, ER/PR+, lumpectomy and XRT - completed 08/2017), CKD stage 3, CVA 2011 (residual weakness on LT side), CAD s/p CABG andMVR/resection of fibroelastoma, IDA  Patient Active Problem List   Diagnosis Date Noted  . Microalbuminuria 04/08/2018  . Genetic testing 09/25/2017  . Family history of breast cancer   . Family history of prostate cancer   . Malignant neoplasm of upper-outer quadrant of right breast in female, estrogen receptor positive ([place]) 06/26/2017  . Lumbar herniated disc 04/15/2015  . Abnormal nuclear stress test 04/03/2015  . 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 CAD (coronary artery disease), native coronary artery 03/30/2015  . COPD (chronic obstructive pulmonary disease) ([place]) 12/24/2013  . Acute respiratory failure with hypoxia ([place]) 02/16/2013  . S/P mitral valve repair 11/16/2010  . Dyslipidemia 06/29/2010  . Diabetes ([place]) 09/29/2006  . ANEMIA-IRON DEFICIENCY 09/29/2006  . Essential hypertension 09/29/2006    Current Outpatient Medications on File Prior to Visit  Medication Sig Dispense Refill  . ACCU-CHEK SOFTCLIX LANCETS lancets Use as instructed 100 each 12  . acetaminophen (TYLENOL) 500 MG tablet Take 1 tablet (500 mg total) by mouth every 8 (eight) hours as needed. 100 tablet 1  . albuterol (PROVENTIL HFA;VENTOLIN HFA) 108 (90 Base) MCG/ACT inhaler Inhale 2 puffs into the lungs every 6 (six) hours as needed for wheezing or shortness of breath. 1 Inhaler 2  . amLODipine (NORVASC) 10 MG tablet Take 1 tablet (10 mg total) by mouth daily. 90 tablet 2  . aspirin EC 81 MG tablet Take 1 tablet (81 mg total) by mouth daily. 30 tablet 5  . atorvastatin (LIPITOR) 40 MG  tablet Take 1 tablet (40 mg total) by mouth daily. 90 tablet 2  . Blood Glucose Monitoring Suppl (ACCU-CHEK AVIVA PLUS) w/Device KIT Use as directed 1 kit 0  . buPROPion (WELLBUTRIN XL) 150 MG 24 hr tablet Take 1 tablet (150 mg total) by mouth daily. 90 tablet 1  . carvedilol (COREG) 6.25 MG tablet Take 1 tablet (6.25 mg total) by mouth 2 (two) times daily with a meal. 180 tablet 3  . diclofenac sodium (VOLTAREN) 1 % GEL Apply 2 g topically 4 (four) times daily as needed. 100 g 1  . FEROSUL 325 (65 Fe) MG tablet TAKE 1 TABLET (325 MG TOTAL) BY MOUTH DAILY WITH BREAKFAST. 90 tablet 0  . glucose blood (ACCU-CHEK AVIVA PLUS) test strip Use as instructed 100 each 12  . hydrochlorothiazide (HYDRODIURIL) 25 MG tablet Take 1 tablet (25 mg total) by mouth daily. 90 tablet 1  . letrozole (FEMARA) 2.5 MG tablet Take 1 tablet (2.5 mg total) by mouth daily. 90 tablet 3  . linagliptin (TRADJENTA) 5 MG TABS tablet Take 5 mg by mouth daily.    [name] losartan (COZAAR) 100 MG tablet Take 1 tablet (100 mg total) by mouth daily. 90 tablet 0  . metFORMIN (GLUCOPHAGE) 1000 MG tablet Take 1 tablet (1,000 mg total) by mouth 2 (two) times daily with a meal. 180 tablet 0  . pantoprazole (PROTONIX) 40 MG tablet Take 1 tablet (40 mg total) by mouth daily. 90 tablet 3  . polyethylene glycol (MIRALAX / GLYCOLAX) packet Take 17 g by  mouth daily as needed for mild constipation or moderate constipation. 30 each 2  . traZODone (DESYREL) 50 MG tablet TAKE 1 TABLET AT BEDTIME AS NEEDED FOR SLEEP 90 tablet 0   No current facility-administered medications on file prior to visit.     No Known Allergies   Health Risk Assessment The patient has completed a Health Risk Assessment. This has been reviewed with the patient and has been scanned into the [facility] system as a separate document.   Current Medical Providers and Suppliers The providers who are involved in the care of this patient are listed above. Additional providers and suppliers  are listed below: Oncology: Dr. Nicholas Lose, NP [place] Surgeon:  Dr. Alphonsa Overall Ophthalmology: Dr. Gershon Crane  Age-appropriate Screening Schedule Refer to the list in the Health Maintenance section for an age appropriated screening completed by this patient. Additional screening recommendations are listed below in the plan section. The patient has been provided with a written plan.    Health Maintenance Due  Topic Date Due  . OPHTHALMOLOGY EXAM  07/28/2017  . PNA vac Low Risk Adult (2 of 2 - PPSV23) 04/24/2018   Depression Screen Over the past two weeks have you:     Felt down or depressed? no     Had little interest or pleasure in doing things? no     depression    Functional Ability/Safety Screen 1. Falls Risk: Does the patient need assistance with ambulation? no Does the patient have a history of a fall in the last 90 days? no Is the patient at risk for falls? Yes, sometimes she feels like balance is off. Was the patient's timed "Get Up and Go Test" unsteady or longer than 30 seconds?  Patient did get up and go test in 10 seconds.  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  She has decreased foot to floor clearance.   2. Does the patient need help with: Ron Parker index)         Bathing: no         Dressing : no         Toileting: yes - has to hold onto sink and tub to push self up off toilet.  She feels she would benefit from a toilet seat extender or [place] the toilet to hold onto         Transferring: no only when getting off the toilet         Continence: incontinence of urine only if she holds it a long time         Feeding: no           3. Does the home have:         Rugs in the hallway: yes, dose have grip mat under it         Grab bars in the bathroom: no.  Feels she will benefit from having shower chair or bench due to feeling off balance at times.  She does not wish to have grab bars.         Handrails on the stairs:yes         Stairs in home: yes          Poor lightning: no           Hearing Evaluation:     Do you have trouble hearing the television when others do not? no     Do you have to strain to hear/understand conversations? no  Advanced Care Planning  Patient has executed an Advance Directive: no     If no, patient was given the opportunity to execute an Advance Directive today?  Patient given permission packet on advanced care planning including how to execute an advance directive and medical power of attorney     This patient has the ability to prepare an Advance Directive: yes     Provider is willing to follow the patient's wishes: yes      Cognitive Assessment: Does the patient have evidence of cognitive impairment? No The patient does not have evidence of a change in mood/affect, appearance, speech, memory or motor skills.  Patient scored 5/5 on mini cog exam.  She was able to draw clock and set it to the requested time correctly and she was able to recall 3 out of 3 on 5-minute recall  Identification of Risk Factors: Risk factors include: Risk for fall when getting off the toilet and when in the shower..    PHYSICAL EXAM: Vitals:   08/10/18 1031  BP: 138/74  Pulse: 67  Resp: 16  Temp: 98.3 F (36.8 C)  TempSrc: Oral  SpO2: 99%  Weight: 228 lb 12.8 oz (103.8 kg)  Height: 5' 7.5" (1.715 m)   Body mass index is 35.31 kg/m. General appearance - alert, well appearing, and in no distress     ASSESSMENT AND PLAN: 1. Medicare annual wellness visit, initial Patient given packet on advanced care planning.  The packet includes simple forms that she can complete to execute advanced directive and assign a medical power of attorney.  I have encouraged her to have this discussion with her spouse and her children and if she decides to execute, she should have it notarized, keep a copy for herself and bring a copy for our records.  2. Need for vaccination against Streptococcus pneumoniae Given today.  3. Type 2 diabetes  mellitus without complication, without long-term current use of insulin ([place]) Not addressed today.  We will bring her back in 3 weeks to address her chronic medical issues Referred to Dr. Gershon Crane for her eye exam.  4. Gait disturbance Patient given prescription for toilet seat extender and shower chair. - DME Other see comment   During the course of the visit the patient was educated and counseled about appropriate screening and preventive services including:          Orders placed during this encounter include: Orders Placed This Encounter  Procedures  . DME Other see comment    Shower chair Toilet seat extender  . Ambulatory referral to Ophthalmology    Referral Priority:   Routine    Referral Type:   Consultation    Referral Reason:   Specialty Services Required    Requested Specialty:   Ophthalmology    Number of Visits Requested:   1     Return in about 3 weeks (around 08/31/2018) for chronic ds managemen.   ? An after visit summary with all of these plans was given to the patient.

## 2018-08-30 DIAGNOSIS — E119 Type 2 diabetes mellitus without complications: Secondary | ICD-10-CM | POA: Diagnosis not present

## 2018-08-30 DIAGNOSIS — Z961 Presence of intraocular lens: Secondary | ICD-10-CM | POA: Diagnosis not present

## 2018-08-30 DIAGNOSIS — Z7984 Long term (current) use of oral hypoglycemic drugs: Secondary | ICD-10-CM | POA: Diagnosis not present

## 2018-09-10 ENCOUNTER — Other Ambulatory Visit: Payer: Self-pay | Admitting: Internal Medicine

## 2018-09-10 DIAGNOSIS — G8929 Other chronic pain: Secondary | ICD-10-CM

## 2018-09-10 DIAGNOSIS — Z760 Encounter for issue of repeat prescription: Secondary | ICD-10-CM

## 2018-09-10 DIAGNOSIS — F329 Major depressive disorder, single episode, unspecified: Secondary | ICD-10-CM

## 2018-09-10 DIAGNOSIS — M545 Low back pain: Secondary | ICD-10-CM

## 2018-09-10 DIAGNOSIS — E1165 Type 2 diabetes mellitus with hyperglycemia: Secondary | ICD-10-CM

## 2018-09-10 DIAGNOSIS — F32A Depression, unspecified: Secondary | ICD-10-CM

## 2018-09-10 DIAGNOSIS — I10 Essential (primary) hypertension: Secondary | ICD-10-CM

## 2018-09-11 ENCOUNTER — Other Ambulatory Visit: Payer: Self-pay

## 2018-09-11 MED ORDER — LINAGLIPTIN 5 MG PO TABS: 5.0000 mg | ORAL_TABLET | Freq: Every day | ORAL | 1 refills | Status: DC

## 2018-09-12 ENCOUNTER — Other Ambulatory Visit: Payer: Self-pay

## 2018-09-12 DIAGNOSIS — Z760 Encounter for issue of repeat prescription: Secondary | ICD-10-CM

## 2018-09-12 MED ORDER — ALBUTEROL SULFATE HFA 108 (90 BASE) MCG/ACT IN AERS: 2.0000 | INHALATION_SPRAY | Freq: Four times a day (QID) | RESPIRATORY_TRACT | 2 refills | Status: DC | PRN

## 2018-09-14 ENCOUNTER — Other Ambulatory Visit: Payer: Self-pay

## 2018-09-14 ENCOUNTER — Ambulatory Visit: Payer: Medicare PPO | Attending: Internal Medicine | Admitting: Internal Medicine

## 2018-09-14 ENCOUNTER — Encounter: Payer: Self-pay | Admitting: Internal Medicine

## 2018-09-14 VITALS — BP 161/79 | HR 73 | Temp 98.2°F | Resp 16 | Wt 237.6 lb

## 2018-09-14 DIAGNOSIS — G8929 Other chronic pain: Secondary | ICD-10-CM | POA: Diagnosis not present

## 2018-09-14 DIAGNOSIS — L600 Ingrowing nail: Secondary | ICD-10-CM

## 2018-09-14 DIAGNOSIS — E119 Type 2 diabetes mellitus without complications: Secondary | ICD-10-CM

## 2018-09-14 DIAGNOSIS — M545 Low back pain: Secondary | ICD-10-CM | POA: Diagnosis not present

## 2018-09-14 DIAGNOSIS — I25810 Atherosclerosis of coronary artery bypass graft(s) without angina pectoris: Secondary | ICD-10-CM | POA: Diagnosis not present

## 2018-09-14 DIAGNOSIS — I10 Essential (primary) hypertension: Secondary | ICD-10-CM | POA: Diagnosis not present

## 2018-09-14 LAB — GLUCOSE, POCT (MANUAL RESULT ENTRY): POC Glucose: 163 mg/dl — AB (ref 70–99)

## 2018-09-14 MED ORDER — GLIPIZIDE 5 MG PO TABS: 5.0000 mg | ORAL_TABLET | Freq: Every day | ORAL | 1 refills | Status: DC

## 2018-09-14 NOTE — Progress Notes (Signed)
Patient ID: [name], female    DOB: Sep 02, 1950  MRN: [id number]  CC: Diabetes and Hypertension   Subjective: [name] is a 68 y.o. female who presents for chronic ds management Her concerns today include:  Pt with hx of HTN, DM, HL, depression, chronic back pain, RT breast CA (intraductal CA, ER/PR+, lumpectomy and XRT- completed 08/2017), CKD stage 3, CVA 2011 (residual weakness on LT side), CAD s/p CABG andMVR/resection of fibroelastoma, IDA  DM:  Checking BS once a day in morning.  Gives range 85-93.  BS started inc after he had stop Glucotrol 5 mg daily back in October 2019, so she started taking again. Taking Glucotrol, Metformin and Tradjenta. -doing better with eating habits, less sugar drinks; eating more fruits. Had A1C checked by [facility] through [facility] at home recently and level was 7.1 Had eye exam by Dr. Diana Eves this mth.  No retinopthy Results for orders placed or performed in visit on 09/14/18  POCT glucose (manual entry)  Result Value Ref Range   POC Glucose 163 (A) 70 - 99 mg/dl   HTN/CAD:  Misplaced device to check BP Feels BP elev today because it is the anniversary of her sisters's death and she was crying this a.m because of it.  Did not take meds as yet -no CP/SOB/LE edema/PND/orthopnea -reports compliance of meds and Lipitor  COPD: does not smoke. Uses Albuterol inhaler but not every day or every wk  "Everyday I'm in a lot of pain with my back."  Very stiff and painful in mornings. voltaren gel helps.  Takes Tylenol 500 mg once a day Last surgery was in 2016.  Forgot to get x-ray done that was ordered 03/2018 No numbness or tingling.  No radiation down legs  Not walking as much as she would like to because of pain and also because she has problems walking on uneven surfaces.  She has not had any falls.  Requested that I take a look at her left big toe.  Has some soreness around the distal nail bed medial aspect.  She wants to make  sure it is not infected.  She get her nails clipped at the nail salon  Patient Active Problem List   Diagnosis Date Noted  . Microalbuminuria 04/08/2018  . Genetic testing 09/25/2017  . Family history of breast cancer   . Family history of prostate cancer   . Malignant neoplasm of upper-outer quadrant of right breast in female, estrogen receptor positive ([place]) 06/26/2017  . Lumbar herniated disc 04/15/2015  . Abnormal nuclear stress test 04/03/2015  . 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 CAD (coronary artery disease), native coronary artery 03/30/2015  . COPD (chronic obstructive pulmonary disease) ([place]) 12/24/2013  . Acute respiratory failure with hypoxia ([place]) 02/16/2013  . S/P mitral valve repair 11/16/2010  . Dyslipidemia 06/29/2010  . Diabetes ([place]) 09/29/2006  . ANEMIA-IRON DEFICIENCY 09/29/2006  . Essential hypertension 09/29/2006     Current Outpatient Medications on File Prior to Visit  Medication Sig Dispense Refill  . ACCU-CHEK SOFTCLIX LANCETS lancets Use as instructed 100 each 12  . ACETAMINOPHEN EXTRA STRENGTH 500 MG tablet TAKE 1 TABLET EVERY 8 HOURS AS NEEDED 100 tablet 0  . albuterol (PROVENTIL HFA;VENTOLIN HFA) 108 (90 Base) MCG/ACT inhaler Inhale 2 puffs into the lungs every 6 (six) hours as needed for wheezing or shortness of breath. 1 Inhaler 2  . amLODipine (NORVASC) 10 MG tablet Take 1 tablet (10 mg total) by mouth daily. 90 tablet  2  . aspirin EC 81 MG tablet Take 1 tablet (81 mg total) by mouth daily. 30 tablet 5  . atorvastatin (LIPITOR) 40 MG tablet Take 1 tablet (40 mg total) by mouth daily. 90 tablet 2  . Blood Glucose Monitoring Suppl (ACCU-CHEK AVIVA PLUS) w/Device KIT Use as directed 1 kit 0  . buPROPion (WELLBUTRIN XL) 150 MG 24 hr tablet TAKE 1 TABLET (150 MG TOTAL) BY MOUTH DAILY. 90 tablet 0  . carvedilol (COREG) 6.25 MG tablet Take 1 tablet (6.25 mg total) by mouth 2 (two) times daily with a meal. 180 tablet 3  . diclofenac sodium  (VOLTAREN) 1 % GEL Apply 2 g topically 4 (four) times daily as needed. 100 g 1  . FEROSUL 325 (65 Fe) MG tablet TAKE 1 TABLET (325 MG TOTAL) BY MOUTH DAILY WITH BREAKFAST. 90 tablet 0  . glucose blood (ACCU-CHEK AVIVA PLUS) test strip Use as instructed 100 each 12  . hydrochlorothiazide (HYDRODIURIL) 25 MG tablet TAKE 1 TABLET (25 MG TOTAL) BY MOUTH DAILY. 90 tablet 0  . letrozole (FEMARA) 2.5 MG tablet Take 1 tablet (2.5 mg total) by mouth daily. 90 tablet 3  . linagliptin (TRADJENTA) 5 MG TABS tablet Take 1 tablet (5 mg total) by mouth daily. 90 tablet 1  . losartan (COZAAR) 100 MG tablet TAKE 1 TABLET (100 MG TOTAL) BY MOUTH DAILY. 90 tablet 0  . metFORMIN (GLUCOPHAGE) 1000 MG tablet TAKE 1 TABLET TWICE DAILY WITH MEALS 180 tablet 0  . pantoprazole (PROTONIX) 40 MG tablet Take 1 tablet (40 mg total) by mouth daily. 90 tablet 3  . polyethylene glycol (MIRALAX / GLYCOLAX) packet Take 17 g by mouth daily as needed for mild constipation or moderate constipation. 30 each 2  . traZODone (DESYREL) 50 MG tablet TAKE 1 TABLET AT BEDTIME AS NEEDED FOR SLEEP 90 tablet 0   No current facility-administered medications on file prior to visit.     No Known Allergies  Social History   Socioeconomic History  . Marital status: Married    Spouse name: Not on file  . Number of children: 3  . Years of education: Not on file  . Highest education level: Not on file  Occupational History    Employer: UNEMPLOYED  Social Needs  . Financial resource strain: Not on file  . Food insecurity:    Worry: Not on file    Inability: Not on file  . Transportation needs:    Medical: Not on file    Non-medical: Not on file  Tobacco Use  . Smoking status: Former Smoker    Last attempt to quit: 10/14/2010    Years since quitting: 7.9  . Smokeless tobacco: Never Used  Substance and Sexual Activity  . Alcohol use: No  . Drug use: No  . Sexual activity: Never  Lifestyle  . Physical activity:    Days per week:  Not on file    Minutes per session: Not on file  . Stress: Not on file  Relationships  . Social connections:    Talks on phone: Not on file    Gets together: Not on file    Attends religious service: Not on file    Active member of club or organization: Not on file    Attends meetings of clubs or organizations: Not on file    Relationship status: Not on file  . Intimate partner violence:    Fear of current or ex partner: Not on file    Emotionally  abused: Not on file    Physically abused: Not on file    Forced sexual activity: Not on file  Other Topics Concern  . Not on file  Social History Narrative   Lives with husband, stay at home, uses cane occasionally, still active/ambulatory.     Family History  Problem Relation Age of Onset  . Colon cancer Mother        dx 65s; deceased 11  . Irritable bowel syndrome Mother   . Prostate cancer Father        deceased 32s  . Cirrhosis Sister        died of GI bleed associated with cirrhosis of the liver  . Stroke Brother   . Prostate cancer Brother 79       deceased 76  . Diabetes Brother        x3  . Kidney disease Brother   . Cancer Brother 79       unk. type  . Breast cancer Cousin        several maternal 1st and 2nd cousins with breast cancer    Past Surgical History:  Procedure Laterality Date  . BREAST LUMPECTOMY Right   . BREAST LUMPECTOMY WITH RADIOACTIVE SEED AND SENTINEL LYMPH NODE BIOPSY Right 07/20/2017   Procedure: RIGHT BREAST LUMPECTOMY WITH RADIOACTIVE SEED AND SENTINEL LYMPH NODE BIOPSY;  Surgeon: Alphonsa Overall, MD;  Location: [facility];  Service: General;  Laterality: Right;  . CARDIAC CATHETERIZATION  04/03/2015   Procedure: Left Heart Cath and Cors/Grafts Angiography;  Surgeon: Peter M Martinique, MD;  Location: [facility];  Service: Cardiovascular;;  . CORONARY ARTERY BYPASS GRAFT  1/12  . history of ankle fractures requiring surgery    . LUMBAR LAMINECTOMY/DECOMPRESSION MICRODISCECTOMY Left 04/15/2015    Procedure: LUMBAR LAMINECTOMY/DECOMPRESSION MICRODISCECTOMY 1 LEVEL;  Surgeon: Newman Pies, MD;  Location: [facility];  Service: Neurosurgery;  Laterality: Left;  Left L23 microdiskectomy  . MV repair and resection of mass  1/12  . TONSILLECTOMY  1971  . TOTAL ABDOMINAL HYSTERECTOMY  1978  . VESICOVAGINAL FISTULA CLOSURE W/ TAH      ROS: Review of Systems Negative except as stated above  PHYSICAL EXAM: BP (!) 161/79   Pulse 73   Temp 98.2 F (36.8 C) (Oral)   Resp 16   Wt 237 lb 9.6 oz (107.8 kg)   SpO2 99%   BMI 36.66 kg/m   Physical Exam  General appearance - alert, well appearing, obese older African-American female and in no distress Mental status - normal mood, behavior, speech, dress, motor activity, and thought processes Neck - supple, no significant adenopathy Chest - clear to auscultation, no wheezes, rales or rhonchi, symmetric air entry Heart - normal rate, regular rhythm, normal S1, S2, no murmurs, rubs, clicks or gallops Musculoskeletal -patient ambulates independently and transfers onto exam table independently.  Mild tenderness on palpation of the lower lumbar spine.  Power in lower extremities 5/5 bilaterally. Extremities -no lower extremity edema Left big toe: No signs of inflammation or infection.  Distal nail edge on the medial aspect is slightly ingrown Depression screen [facility] 2/9 08/10/2018 04/06/2018 01/04/2018  Decreased Interest '2 1 2  '$ Down, Depressed, Hopeless 1 0 1  PHQ - 2 Score '3 1 3  '$ Altered sleeping 2 - 3  Tired, decreased energy 2 - 3  Change in appetite 3 - 3  Feeling bad or failure about yourself  0 - 2  Trouble concentrating 0 - 0  Moving slowly  or fidgety/restless 0 - 0  Suicidal thoughts 0 - 0  PHQ-9 Score 10 - 14  Some recent data might be hidden    CMP Latest Ref Rng & Units 11/09/2017 07/14/2017 09/26/2016  Glucose 65 - 99 mg/dL 196(H) 189(H) 196(H)  BUN 8 - 27 mg/dL 15 22(H) 26  Creatinine 0.57 - 1.00 mg/dL 1.42(H) 1.40(H) 1.17(H)   Sodium 134 - 144 mmol/L 138 139 139  Potassium 3.5 - 5.2 mmol/L 4.8 4.4 4.4  Chloride 96 - 106 mmol/L 100 105 101  CO2 20 - 29 mmol/L '22 23 24  '$ Calcium 8.7 - 10.3 mg/dL 9.6 9.5 9.1  Total Protein 6.0 - 8.5 g/dL 6.4 - 6.8  Total Bilirubin 0.0 - 1.2 mg/dL 0.4 - 0.3  Alkaline Phos 39 - 117 IU/L 70 - 66  AST 0 - 40 IU/L 14 - 19  ALT 0 - 32 IU/L 13 - 11   Lipid Panel     Component Value Date/Time   CHOL 223 (H) 11/09/2017 1243   TRIG 121 11/09/2017 1243   HDL 90 11/09/2017 1243   CHOLHDL 2.5 11/09/2017 1243   CHOLHDL 2.8 04/28/2016 1028   VLDL 19 04/28/2016 1028   LDLCALC 109 (H) 11/09/2017 1243   LDLDIRECT 101.7 07/18/2011 0901    CBC    Component Value Date/Time   WBC 5.1 11/09/2017 1243   WBC 8.9 07/14/2017 0930   RBC 4.41 11/09/2017 1243   RBC 4.25 07/14/2017 0930   HGB 11.4 11/09/2017 1243   HCT 35.4 11/09/2017 1243   PLT 223 11/09/2017 1243   MCV 80 11/09/2017 1243   MCH 25.9 (L) 11/09/2017 1243   MCH 25.6 (L) 07/14/2017 0930   MCHC 32.2 11/09/2017 1243   MCHC 31.5 07/14/2017 0930   RDW 15.4 11/09/2017 1243   LYMPHSABS 2,541 04/28/2016 1028   MONOABS 385 04/28/2016 1028   EOSABS 77 04/28/2016 1028   BASOSABS 0 04/28/2016 1028    ASSESSMENT AND PLAN: 1. Type 2 diabetes mellitus without complication, without long-term current use of insulin (HCC) Blood sugar is good.  I have added glipizide back to the med list since she has restarted taking that without any hypoglycemia.  Encouraged her to continue healthy eating habits.  Encouraged her to try to get out and walk on level surface a few times a week - POCT glucose (manual entry) - Hemoglobin A1c - CBC - Comprehensive metabolic panel - Lipid panel  2. Essential hypertension Not at goal.  Patient has not taken meds as yet for today.  No changes made  3. Coronary artery disease involving coronary bypass graft of native heart without angina pectoris Stable and asymptomatic.  Continue aspirin, Lipitor, labetalol  and Cozaar  4. Chronic midline low back pain without sciatica Encourage patient to get the x-ray of the lumbar spine done whenever the hospital radiology starts allowing routine imaging to be done.  Right now we are in the midst of the coronavirus outbreak. Advised patient to take the Tylenol as scheduled and at least 2-3 times a day and use the Voltaren gel twice a day If these medications still do not control the pain enough to allow her to be functional, the next step would be to refer her to PMR for pain management  5. Ingrown nail of great toe of left foot Discussed referral to podiatry but patient wants to hold off for now.    Patient was given the opportunity to ask questions.  Patient verbalized understanding of the plan  and was able to repeat key elements of the plan.   Orders Placed This Encounter  Procedures  . Hemoglobin A1c  . CBC  . Comprehensive metabolic panel  . Lipid panel  . POCT glucose (manual entry)     Requested Prescriptions   Signed Prescriptions Disp Refills  . glipiZIDE (GLUCOTROL) 5 MG tablet 90 tablet 1    Sig: Take 1 tablet (5 mg total) by mouth daily before breakfast.    Return in about 3 months (around 12/15/2018).  Karle Plumber, MD, FACP

## 2018-09-14 NOTE — Patient Instructions (Signed)
Please remember to go to the radiology department at [facility] to get the x-ray of your lower back.  Once we have the results of that we can discuss sending you to physical medicine and rehab for pain management.  I recommend taking the Tylenol 3 times a day.  Use the Voltaren gel at least twice a day.

## 2018-09-15 LAB — COMPREHENSIVE METABOLIC PANEL
ALT: 9 IU/L (ref 0–32)
AST: 14 IU/L (ref 0–40)
Albumin/Globulin Ratio: 1.4 (ref 1.2–2.2)
Albumin: 4 g/dL (ref 3.8–4.8)
Alkaline Phosphatase: 54 IU/L (ref 39–117)
BUN / CREAT RATIO: 17 (ref 12–28)
BUN: 21 mg/dL (ref 8–27)
Bilirubin Total: 0.4 mg/dL (ref 0.0–1.2)
CO2: 21 mmol/L (ref 20–29)
Calcium: 9.2 mg/dL (ref 8.7–10.3)
Chloride: 103 mmol/L (ref 96–106)
Creatinine, Ser: 1.22 mg/dL — ABNORMAL HIGH (ref 0.57–1.00)
GFR calc Af Amer: 52 mL/min/{1.73_m2} — ABNORMAL LOW (ref >59)
GFR calc non Af Amer: 45 mL/min/{1.73_m2} — ABNORMAL LOW (ref >59)
Globulin, Total: 2.8 g/dL (ref 1.5–4.5)
Glucose: 105 mg/dL — ABNORMAL HIGH (ref 65–99)
Potassium: 4.5 mmol/L (ref 3.5–5.2)
Sodium: 139 mmol/L (ref 134–144)
Total Protein: 6.8 g/dL (ref 6.0–8.5)

## 2018-09-15 LAB — CBC
Hematocrit: 37.1 % (ref 34.0–46.6)
Hemoglobin: 11.7 g/dL (ref 11.1–15.9)
MCH: 26.2 pg — ABNORMAL LOW (ref 26.6–33.0)
MCHC: 31.5 g/dL (ref 31.5–35.7)
MCV: 83 fL (ref 79–97)
Platelets: 238 10*3/uL (ref 150–450)
RBC: 4.47 x10E6/uL (ref 3.77–5.28)
RDW: 14.1 % (ref 11.7–15.4)
WBC: 5.1 10*3/uL (ref 3.4–10.8)

## 2018-09-15 LAB — LIPID PANEL
CHOLESTEROL TOTAL: 235 mg/dL — AB (ref 100–199)
Chol/HDL Ratio: 2.5 ratio (ref 0.0–4.4)
HDL: 93 mg/dL (ref >39)
LDL Calculated: 116 mg/dL — ABNORMAL HIGH (ref 0–99)
Triglycerides: 132 mg/dL (ref 0–149)
VLDL Cholesterol Cal: 26 mg/dL (ref 5–40)

## 2018-09-15 LAB — HEMOGLOBIN A1C
ESTIMATED AVERAGE GLUCOSE: 186 mg/dL
Hgb A1c MFr Bld: 8.1 % — ABNORMAL HIGH (ref 4.8–5.6)

## 2018-09-17 ENCOUNTER — Telehealth: Payer: Self-pay

## 2018-09-17 DIAGNOSIS — E782 Mixed hyperlipidemia: Secondary | ICD-10-CM

## 2018-09-17 MED ORDER — ATORVASTATIN CALCIUM 40 MG PO TABS: 60.0000 mg | ORAL_TABLET | Freq: Every day | ORAL | 2 refills | Status: DC

## 2018-09-17 NOTE — Telephone Encounter (Signed)
Lipitor to be increased to 60 mg daily.

## 2018-09-17 NOTE — Telephone Encounter (Signed)
Contacted pt to go over lab results pt doesn't have any questions or concerns  Dr. Wynetta Emery pt states she is taking Liptor everyday. Pt will like nex rx sent to [facility]

## 2018-11-06 ENCOUNTER — Telehealth: Payer: Self-pay | Admitting: Internal Medicine

## 2018-11-06 NOTE — Telephone Encounter (Signed)
[name] with [facility] called stating that the patient reported decreased energy and increased in depression. Patient would like for her anti depressant to be increased if possible. Please follow up.

## 2018-11-07 NOTE — Telephone Encounter (Signed)
Will forward to pcp

## 2018-12-19 ENCOUNTER — Ambulatory Visit: Payer: Medicare PPO | Attending: Internal Medicine | Admitting: Internal Medicine

## 2018-12-19 ENCOUNTER — Other Ambulatory Visit: Payer: Self-pay

## 2018-12-19 ENCOUNTER — Encounter: Payer: Self-pay | Admitting: Internal Medicine

## 2018-12-19 VITALS — Wt 220.0 lb

## 2018-12-19 DIAGNOSIS — E118 Type 2 diabetes mellitus with unspecified complications: Secondary | ICD-10-CM | POA: Diagnosis not present

## 2018-12-19 DIAGNOSIS — E669 Obesity, unspecified: Secondary | ICD-10-CM

## 2018-12-19 DIAGNOSIS — K5900 Constipation, unspecified: Secondary | ICD-10-CM | POA: Diagnosis not present

## 2018-12-19 DIAGNOSIS — M8580 Other specified disorders of bone density and structure, unspecified site: Secondary | ICD-10-CM | POA: Diagnosis not present

## 2018-12-19 DIAGNOSIS — Z853 Personal history of malignant neoplasm of breast: Secondary | ICD-10-CM | POA: Diagnosis not present

## 2018-12-19 DIAGNOSIS — I25810 Atherosclerosis of coronary artery bypass graft(s) without angina pectoris: Secondary | ICD-10-CM

## 2018-12-19 DIAGNOSIS — E1165 Type 2 diabetes mellitus with hyperglycemia: Secondary | ICD-10-CM | POA: Insufficient documentation

## 2018-12-19 DIAGNOSIS — N183 Chronic kidney disease, stage 3 unspecified: Secondary | ICD-10-CM

## 2018-12-19 DIAGNOSIS — I10 Essential (primary) hypertension: Secondary | ICD-10-CM | POA: Diagnosis not present

## 2018-12-19 MED ORDER — POLYETHYLENE GLYCOL 3350 17 G PO PACK: 17.0000 g | PACK | Freq: Every day | ORAL | 5 refills | Status: DC | PRN

## 2018-12-19 NOTE — Progress Notes (Signed)
Virtual Visit via Telephone Note Due to current restrictions/limitations of in-office visits due to the COVID-19 pandemic, this scheduled clinical appointment was converted to a telehealth visit  I connected with [name] on 12/19/18 at 1:43 p.m by telephone and verified that I am speaking with the correct person using two identifiers. I am in my office.  The patient is at home.  Only the patient and myself participated in this encounter.  I discussed the limitations, risks, security and privacy concerns of performing an evaluation and management service by telephone and the availability of in person appointments. I also discussed with the patient that there may be a patient responsible charge related to this service. The patient expressed understanding and agreed to proceed.   History of Present Illness: Pt with hx of HTN, DM, HL, depression, chronic back pain, RT breast CA (intraductal CA, ER/PR+, lumpectomy and XRT- completed 08/2017), CKD stage 3, CVA 2011 (residual weakness on LT side), CAD s/p CABG andMVR/resection of fibroelastoma, IDA.  Last seen 08/2018.  Purpose of today's visit was chronic disease management.  DM:  Checking BS 1-2 times a day. Before BF 78-128.  The 70s are occasionally.  Evenings in the 90s -Eating habits: eating more fruits and smaller portion sizes.  Wgh 2 days ago was 220 lbs.  Loss 17 lbs since last visit 3 mths Exercise:  She tries to move as much as she can Med:  Compliant with meds below  HYPERTENSION/CAD/hx of MVR Currently taking: see medication list Med Adherence: '[x]'$  Yes    '[]'$  No Medication side effects: '[]'$  Yes    '[x]'$  No Adherence with salt restriction: '[x]'$  Yes    '[]'$  No Home Monitoring?: '[]'$  Yes    '[x]'$  No, misplaced BP cuff/device Monitoring Frequency: '[]'$  Yes    '[]'$  No Home BP results range: '[]'$  Yes    '[]'$  No SOB? '[]'$  Yes    '[x]'$  No Chest Pain?: '[]'$  Yes    '[x]'$  No Leg swelling?: '[]'$  Yes    '[x]'$  No Headaches?: '[]'$  Yes    '[x]'$  No Dizziness? '[]'$  Yes     '[x]'$  No Comments: no palpitations/PND/orthopnea  CKD3:  Makes good urine.    Hx of breast CA: saw her cancer specialist earlier this yr Had BMD 05/2018 that revealed osteopenia.  Pt on OTC Ca+ Vit.  She does not recall the dose    Current Outpatient Medications on File Prior to Visit  Medication Sig Dispense Refill  . ACCU-CHEK SOFTCLIX LANCETS lancets Use as instructed 100 each 12  . ACETAMINOPHEN EXTRA STRENGTH 500 MG tablet TAKE 1 TABLET EVERY 8 HOURS AS NEEDED 100 tablet 0  . albuterol (PROVENTIL HFA;VENTOLIN HFA) 108 (90 Base) MCG/ACT inhaler Inhale 2 puffs into the lungs every 6 (six) hours as needed for wheezing or shortness of breath. 1 Inhaler 2  . amLODipine (NORVASC) 10 MG tablet Take 1 tablet (10 mg total) by mouth daily. 90 tablet 2  . aspirin EC 81 MG tablet Take 1 tablet (81 mg total) by mouth daily. 30 tablet 5  . atorvastatin (LIPITOR) 40 MG tablet Take 1.5 tablets (60 mg total) by mouth daily. 135 tablet 2  . Blood Glucose Monitoring Suppl (ACCU-CHEK AVIVA PLUS) w/Device KIT Use as directed 1 kit 0  . buPROPion (WELLBUTRIN XL) 150 MG 24 hr tablet TAKE 1 TABLET (150 MG TOTAL) BY MOUTH DAILY. 90 tablet 0  . carvedilol (COREG) 6.25 MG tablet Take 1 tablet (6.25 mg total) by mouth 2 (two) times daily  with a meal. 180 tablet 3  . diclofenac sodium (VOLTAREN) 1 % GEL Apply 2 g topically 4 (four) times daily as needed. 100 g 1  . FEROSUL 325 (65 Fe) MG tablet TAKE 1 TABLET (325 MG TOTAL) BY MOUTH DAILY WITH BREAKFAST. 90 tablet 0  . glipiZIDE (GLUCOTROL) 5 MG tablet Take 1 tablet (5 mg total) by mouth daily before breakfast. 90 tablet 1  . glucose blood (ACCU-CHEK AVIVA PLUS) test strip Use as instructed 100 each 12  . hydrochlorothiazide (HYDRODIURIL) 25 MG tablet TAKE 1 TABLET (25 MG TOTAL) BY MOUTH DAILY. 90 tablet 0  . letrozole (FEMARA) 2.5 MG tablet Take 1 tablet (2.5 mg total) by mouth daily. 90 tablet 3  . linagliptin (TRADJENTA) 5 MG TABS tablet Take 1 tablet (5 mg  total) by mouth daily. 90 tablet 1  . losartan (COZAAR) 100 MG tablet TAKE 1 TABLET (100 MG TOTAL) BY MOUTH DAILY. 90 tablet 0  . metFORMIN (GLUCOPHAGE) 1000 MG tablet TAKE 1 TABLET TWICE DAILY WITH MEALS 180 tablet 0  . pantoprazole (PROTONIX) 40 MG tablet Take 1 tablet (40 mg total) by mouth daily. 90 tablet 3  . polyethylene glycol (MIRALAX / GLYCOLAX) packet Take 17 g by mouth daily as needed for mild constipation or moderate constipation. 30 each 2  . traZODone (DESYREL) 50 MG tablet TAKE 1 TABLET AT BEDTIME AS NEEDED FOR SLEEP 90 tablet 0   No current facility-administered medications on file prior to visit.     Observations/Objective:  Results for orders placed or performed in visit on 09/14/18  Hemoglobin A1c  Result Value Ref Range   Hgb A1c MFr Bld 8.1 (H) 4.8 - 5.6 %   Est. average glucose Bld gHb Est-mCnc 186 mg/dL  CBC  Result Value Ref Range   WBC 5.1 3.4 - 10.8 x10E3/uL   RBC 4.47 3.77 - 5.28 x10E6/uL   Hemoglobin 11.7 11.1 - 15.9 g/dL   Hematocrit 37.1 34.0 - 46.6 %   MCV 83 79 - 97 fL   MCH 26.2 (L) 26.6 - 33.0 pg   MCHC 31.5 31.5 - 35.7 g/dL   RDW 14.1 11.7 - 15.4 %   Platelets 238 150 - 450 x10E3/uL  Comprehensive metabolic panel  Result Value Ref Range   Glucose 105 (H) 65 - 99 mg/dL   BUN 21 8 - 27 mg/dL   Creatinine, Ser 1.22 (H) 0.57 - 1.00 mg/dL   GFR calc non Af Amer 45 (L) >59 mL/min/1.73   GFR calc Af Amer 52 (L) >59 mL/min/1.73   BUN/Creatinine Ratio 17 12 - 28   Sodium 139 134 - 144 mmol/L   Potassium 4.5 3.5 - 5.2 mmol/L   Chloride 103 96 - 106 mmol/L   CO2 21 20 - 29 mmol/L   Calcium 9.2 8.7 - 10.3 mg/dL   Total Protein 6.8 6.0 - 8.5 g/dL   Albumin 4.0 3.8 - 4.8 g/dL   Globulin, Total 2.8 1.5 - 4.5 g/dL   Albumin/Globulin Ratio 1.4 1.2 - 2.2   Bilirubin Total 0.4 0.0 - 1.2 mg/dL   Alkaline Phosphatase 54 39 - 117 IU/L   AST 14 0 - 40 IU/L   ALT 9 0 - 32 IU/L  Lipid panel  Result Value Ref Range   Cholesterol, Total 235 (H) 100 - 199  mg/dL   Triglycerides 132 0 - 149 mg/dL   HDL 93 >39 mg/dL   VLDL Cholesterol Cal 26 5 - 40 mg/dL   LDL Calculated  116 (H) 0 - 99 mg/dL   Chol/HDL Ratio 2.5 0.0 - 4.4 ratio  POCT glucose (manual entry)  Result Value Ref Range   POC Glucose 163 (A) 70 - 99 mg/dl   Depression screen [facility] 2/9 12/19/2018 08/10/2018 04/06/2018  Decreased Interest 0 2 1  Down, Depressed, Hopeless 0 1 0  PHQ - 2 Score 0 3 1  Altered sleeping - 2 -  Tired, decreased energy - 2 -  Change in appetite - 3 -  Feeling bad or failure about yourself  - 0 -  Trouble concentrating - 0 -  Moving slowly or fidgety/restless - 0 -  Suicidal thoughts - 0 -  PHQ-9 Score - 10 -  Some recent data might be hidden    Assessment and Plan: 1. Controlled type 2 diabetes mellitus with complication, without long-term current use of insulin ([place]) Reported home blood sugar readings are at goal. Advised patient that blood sugars in the 70s are too low for her.  If this happens more often she should stop the glipizide.  Encourage her to continue healthy eating habits and regular exercise.  2. Obesity (BMI 30-39.9) See #1 above  3. Essential hypertension Patient will try to locate her BP device.  Advised to check blood pressure at least twice a week with goal being 130/80 or lower.  4. Constipation, unspecified constipation type Reports good results with MiraLAX.  Requests refill - polyethylene glycol (MIRALAX / GLYCOLAX) 17 g packet; Take 17 g by mouth daily as needed for mild constipation or moderate constipation.  Dispense: 30 each; Refill: 5  5. Coronary artery disease involving coronary bypass graft of native heart without angina pectoris Clinically stable.  Continue carvedilol, Lipitor, aspirin  6. CKD (chronic kidney disease) stage 3, GFR 30-59 ml/min (HCC) We will continue to monitor.  Advised to not take oral NSAIDs  7. Osteopenia, unspecified location Continue vitamin D supplement.  8. History of right breast  cancer Followed by oncology   Follow Up Instructions: 3 mths   I discussed the assessment and treatment plan with the patient. The patient was provided an opportunity to ask questions and all were answered. The patient agreed with the plan and demonstrated an understanding of the instructions.   The patient was advised to call back or seek an in-person evaluation if the symptoms worsen or if the condition fails to improve as anticipated.  I provided  13 minutes of non-face-to-face time during this encounter.   Karle Plumber, MD

## 2018-12-19 NOTE — Progress Notes (Signed)
Patient verified DOB Patient has not taken medication today. Patient has not eaten today. Patient wakes up with pain in her tailbone daily. Patients receives relief from cream and pain medication. Patient states she uses devices in her home to assist in raising out the bed or off a chair. Patient would like Miralax prescribed again. CBG: 124

## 2018-12-21 ENCOUNTER — Ambulatory Visit: Payer: Medicare PPO | Admitting: Internal Medicine

## 2018-12-25 ENCOUNTER — Telehealth: Payer: Self-pay | Admitting: Licensed Clinical Social Worker

## 2018-12-25 NOTE — Telephone Encounter (Signed)
LCSW placed call to patient to follow up on a consult from Dr. Wynetta Emery to address depression.  Pt shared that she has been managing her depression with medication management. She has not identified any specific triggers, stating "everything is fine, I don't know why I feel depressed sometimes"  LCSW provided psychoeducation on depression and discussed healthy coping skills. Pt was appreciative for the information. She has plans to remain active, implement healthier eating, and spend more time with family. No additional concerns noted.

## 2019-01-07 ENCOUNTER — Telehealth: Payer: Self-pay | Admitting: Internal Medicine

## 2019-01-07 DIAGNOSIS — F329 Major depressive disorder, single episode, unspecified: Secondary | ICD-10-CM

## 2019-01-07 DIAGNOSIS — F32A Depression, unspecified: Secondary | ICD-10-CM

## 2019-01-07 NOTE — Telephone Encounter (Signed)
[name] from [facility] called stating that pt has reported that's she's been feeling more depressed lately, and pt  would like to know if her depression medication can be increased..please follow up

## 2019-01-08 NOTE — Telephone Encounter (Signed)
Will forward to pcp and Jasmine to follow up with pt

## 2019-01-13 MED ORDER — BUPROPION HCL ER (XL) 150 MG PO TB24: 150.0000 mg | ORAL_TABLET | Freq: Two times a day (BID) | ORAL | 2 refills | Status: DC

## 2019-01-13 NOTE — Telephone Encounter (Signed)
PC placed to pt this a.m regarding message received this wk from case worker at [employer]. Pt states that she has been more depressed lately and not sure why.  Takes Bupropion daily in the mornings but feels she needs higher dose.  I told her we can increase it to 150 mg BID.  I inquired where she feels she would benefit from some counseling also. Pt said yes.  She would like to discuss some things with a counselor.  Denies SI.  I will submit referral.   Pt also told me that she is still having problems with her lower back.  I had ordered x-ray 03/2018 but she has not done it as yet.  I encouraged her to get it done then we can go from there.  Advised that she can go to [facility] radiology department.

## 2019-01-16 DIAGNOSIS — Z20828 Contact with and (suspected) exposure to other viral communicable diseases: Secondary | ICD-10-CM | POA: Diagnosis not present

## 2019-01-22 ENCOUNTER — Telehealth: Payer: Self-pay | Admitting: Licensed Clinical Social Worker

## 2019-01-22 NOTE — Telephone Encounter (Signed)
Call placed to patient to follow up on consult from PCP to address depression. Pt shared that she was experiencing overwhelming feelings of sadness. Pt has increased her medication dosage per PCP since last week. Reports decrease in symptoms.   LCSW discussed the importance of applying additional coping skills outside of medication management. Pt was strongly encouraged to schedule a behavioral health appointment. Pt verbalized understanding and will schedule appointment should her symptoms not resolve in the near future. No additional concerns noted.

## 2019-01-24 ENCOUNTER — Telehealth: Payer: Self-pay | Admitting: Internal Medicine

## 2019-01-24 DIAGNOSIS — G8929 Other chronic pain: Secondary | ICD-10-CM

## 2019-01-24 NOTE — Telephone Encounter (Signed)
Will forward to pcp

## 2019-01-24 NOTE — Telephone Encounter (Signed)
Patient called requesting a new  Order for DG Lumbar Spine Complete she called Radiology and they told her to contact her pcp for a new order .  Please, call her back  Thank you  .

## 2019-01-31 ENCOUNTER — Other Ambulatory Visit: Payer: Self-pay | Admitting: Pharmacist

## 2019-01-31 DIAGNOSIS — Z760 Encounter for issue of repeat prescription: Secondary | ICD-10-CM

## 2019-01-31 DIAGNOSIS — G8929 Other chronic pain: Secondary | ICD-10-CM

## 2019-01-31 MED ORDER — TRAZODONE HCL 50 MG PO TABS: 50.0000 mg | ORAL_TABLET | Freq: Every evening | ORAL | 0 refills | Status: DC | PRN

## 2019-01-31 MED ORDER — ACETAMINOPHEN 500 MG PO TABS: 500.0000 mg | ORAL_TABLET | Freq: Three times a day (TID) | ORAL | 0 refills | Status: DC | PRN

## 2019-02-14 ENCOUNTER — Other Ambulatory Visit: Payer: Self-pay | Admitting: Internal Medicine

## 2019-02-14 DIAGNOSIS — E119 Type 2 diabetes mellitus without complications: Secondary | ICD-10-CM

## 2019-03-08 ENCOUNTER — Encounter: Payer: Self-pay | Admitting: Family Medicine

## 2019-03-08 ENCOUNTER — Ambulatory Visit: Payer: Medicare PPO | Attending: Family Medicine | Admitting: Family Medicine

## 2019-03-08 DIAGNOSIS — M545 Low back pain, unspecified: Secondary | ICD-10-CM

## 2019-03-08 DIAGNOSIS — M5440 Lumbago with sciatica, unspecified side: Secondary | ICD-10-CM

## 2019-03-08 DIAGNOSIS — G8929 Other chronic pain: Secondary | ICD-10-CM | POA: Diagnosis not present

## 2019-03-08 MED ORDER — METHOCARBAMOL 500 MG PO TABS: 500.0000 mg | ORAL_TABLET | Freq: Three times a day (TID) | ORAL | 0 refills | Status: DC | PRN

## 2019-03-08 MED ORDER — ACETAMINOPHEN 500 MG PO TABS: 500.0000 mg | ORAL_TABLET | Freq: Three times a day (TID) | ORAL | 0 refills | Status: DC | PRN

## 2019-03-08 NOTE — Progress Notes (Signed)
Virtual Visit via Telephone Note  I connected with [name]  on 03/08/19 at  2:50 PM EDT by telephone and verified that I am speaking with the correct person using two identifiers.   I discussed the limitations, risks, security and privacy concerns of performing an evaluation and management service by telephone and the availability of in person appointments. I also discussed with the patient that there may be a patient responsible charge related to this service. The patient expressed understanding and agreed to proceed.  Patient Location: Home Provider Location: [facility] office Others participating in call: call initiated by Emilio Aspen, CMA   History of Present Illness:      68 year old female who states that she has had chronic issues with low back pain and that she was supposed to have an x-ray of her lumbar spine done as her insurance company recommended that she have this is the first prior to further imaging such as an MRI.  Patient states that she was scheduled to have the x-ray done earlier in the year but due to the COVID pandemic, the hospital stopped doing x-rays and when she called to try and reschedule her x-ray she was told that her order for her lumbar spine film had expired.  Patient needs to have new order placed for a lumbar spine film.  She reports that she has chronic pain in her lower back midline which radiates into her buttocks bilaterally right greater than left and also radiates around the right upper thigh to the groin area.  She also has increased stiffness in her lower back after sleeping and has a great deal of difficulty getting up out of the bed in the mornings due to to the bathroom.  She often has to hold onto something to help pull herself out of bed and to walk to the restroom.  She reports that her pain can be a 9-10 in the mornings.  Pain otherwise is usually somewhere between 8 and 9.  She is taking the acetaminophen that was prescribed.  Patient states that  she sometimes cries secondary to the degree of pain that she is in.  She denies any issues with bowel or bladder dysfunction.  No recent issues with fever or chills.  No current shortness of breath or cough.  No current issues with chest pain or palpitations.  No abdominal pain-no nausea or vomiting.  No numbness or tingling in the feet related to her back pain.  Pain is usually a sharp sensation and sometimes a stabbing sensation in the lower back.   Past Medical History:  Diagnosis Date  . Abscess in epidural space of L2-L5 lumbar spine 03/2006  . Anemia, iron deficiency   . Arthritis   . Breast cancer ([place]) 06/12/2017   right breast  . CAD (coronary artery disease), native coronary artery 03/30/2015  . Cataract   . Colon polyp    a. Multiple colonic polyps status post colonoscopy in October 2007, consistent with tubular adenoma, tubulovillous adenoma with no high-grade dysplasia or malignancy identified.   . Constipation   . Coronary artery disease    a. s/p CABG 06/2010: S-D2; S-PDA (at time of MV surgery).  . Depression   . Diabetes mellitus    type II  . Family history of breast cancer   . Family history of prostate cancer   . Genetic testing 09/25/2017   Multi-Cancer panel (83 genes) @ [facility] - No pathogenic mutations detected  . GERD (gastroesophageal reflux disease)   .  Hx of transient ischemic attack (TIA)    a. See stroke section.  . Hyperlipidemia   . Hypertension   . Hypoxia    a. Has history of acute hypoxic respiratory failure in the setting of bronchitis/PNA or prior admissions.  [name] MRSA infection    a. History of recurrent skin infection and soft tissue abscesses, with MRSA positive in the past.  . 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 mitral valve - s/p resection and MV repair 06/2010 Dr. [name].  . 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 Personal history of radiation therapy   . Sleep apnea   . Stenosis of middle cerebral artery    a. Distal R MCA.   . Stroke [facility])    a. 11/2009: mitral mass diagnosed at this time, also has distal R MCA stenosis, tx with coumadin. b. Readmitted 05/2010 with TIA symptoms - had not been taking Coumadin. s/p MV surgery 06/2010. Coumadin stopped 2013 after review of chart by Dr. [name] since mass was removed (stroke felt possibly related to this).     Past Surgical History:  Procedure Laterality Date  . BREAST LUMPECTOMY Right   . BREAST LUMPECTOMY WITH RADIOACTIVE SEED AND SENTINEL LYMPH NODE BIOPSY Right 07/20/2017   Procedure: RIGHT BREAST LUMPECTOMY WITH RADIOACTIVE SEED AND SENTINEL LYMPH NODE BIOPSY;  Surgeon: Alphonsa Overall, MD;  Location: [place];  Service: General;  Laterality: Right;  . CARDIAC CATHETERIZATION  04/03/2015   Procedure: Left Heart Cath and Cors/Grafts Angiography;  Surgeon: Peter M Martinique, MD;  Location: [place] CV LAB;  Service: Cardiovascular;;  . CORONARY ARTERY BYPASS GRAFT  1/12  . history of ankle fractures requiring surgery    . LUMBAR LAMINECTOMY/DECOMPRESSION MICRODISCECTOMY Left 04/15/2015   Procedure: LUMBAR LAMINECTOMY/DECOMPRESSION MICRODISCECTOMY 1 LEVEL;  Surgeon: Newman Pies, MD;  Location: [place] NEURO ORS;  Service: Neurosurgery;  Laterality: Left;  Left L23 microdiskectomy  . MV repair and resection of mass  1/12  . TONSILLECTOMY  1971  . TOTAL ABDOMINAL HYSTERECTOMY  1978  . VESICOVAGINAL FISTULA CLOSURE W/ TAH      Family History  Problem Relation Age of Onset  . Colon cancer Mother        dx 82s; deceased 20  . Irritable bowel syndrome Mother   . Prostate cancer Father        deceased 53s  . Cirrhosis Sister        died of GI bleed associated with cirrhosis of the liver  . Stroke Brother   . Prostate cancer Brother 71       deceased 40  . Diabetes Brother        x3  . Kidney disease Brother   . Cancer Brother 67       unk. type  . Breast cancer Cousin        several maternal 1st and 2nd cousins with breast cancer    Social History   Tobacco Use   . Smoking status: Former Smoker    Quit date: 10/14/2010    Years since quitting: 8.4  . Smokeless tobacco: Never Used  Substance Use Topics  . Alcohol use: No  . Drug use: No     No Known Allergies     Observations/Objective: No vital signs or physical exam conducted as visit was done via telephone  Assessment and Plan: 1. Low back pain with radiation Patient with complaint of chronic low back pain with radiation to the buttock and anterior thigh.  New order placed  for patient to obtain lumbar spine film.  Refill provided of her acetaminophen extra strength and new prescription for 90-day supply sent in for patient to try Robaxin 500 mg up to 3 times daily as needed for muscle spasm/stiffness and back pain.  Patient is encouraged to make follow-up appointment in 1 to 2 weeks after her x-ray with her primary care provider as patient is also due for follow-up of her chronic medical issues and diabetes. - DG Lumbar Spine Complete; Future - methocarbamol (ROBAXIN) 500 MG tablet; Take 1 tablet (500 mg total) by mouth every 8 (eight) hours as needed for muscle spasms. Wynelle Link pain  Dispense: 270 tablet; Refill: 0 - acetaminophen (ACETAMINOPHEN EXTRA STRENGTH) 500 MG tablet; Take 1 tablet (500 mg total) by mouth every 8 (eight) hours as needed.  Dispense: 100 tablet; Refill: 0  Follow Up Instructions:Return in about 2 weeks (around 03/22/2019) for back pain and DM/chronic issues with Dr. Wynetta Emery.    I discussed the assessment and treatment plan with the patient. The patient was provided an opportunity to ask questions and all were answered. The patient agreed with the plan and demonstrated an understanding of the instructions.   The patient was advised to call back or seek an in-person evaluation if the symptoms worsen or if the condition fails to improve as anticipated.  I provided 11 minutes of non-face-to-face time during this encounter.   Antony Blackbird, MD

## 2019-03-18 ENCOUNTER — Ambulatory Visit (HOSPITAL_COMMUNITY)
Admission: RE | Admit: 2019-03-18 | Discharge: 2019-03-18 | Disposition: A | Discharge status: Home / Self Care | Payer: Medicare PPO | Source: Ambulatory Visit | Attending: Family Medicine | Admitting: Family Medicine

## 2019-03-18 ENCOUNTER — Other Ambulatory Visit: Payer: Self-pay

## 2019-03-18 DIAGNOSIS — M5440 Lumbago with sciatica, unspecified side: Secondary | ICD-10-CM | POA: Insufficient documentation

## 2019-03-18 DIAGNOSIS — M545 Low back pain, unspecified: Secondary | ICD-10-CM

## 2019-03-21 ENCOUNTER — Telehealth: Payer: Self-pay | Admitting: Internal Medicine

## 2019-03-21 ENCOUNTER — Other Ambulatory Visit: Payer: Self-pay | Admitting: Family Medicine

## 2019-03-21 DIAGNOSIS — M545 Low back pain, unspecified: Secondary | ICD-10-CM

## 2019-03-21 DIAGNOSIS — G8929 Other chronic pain: Secondary | ICD-10-CM

## 2019-03-21 NOTE — Progress Notes (Signed)
Patient ID: [name], female   DOB: Jul 24, 1950, 68 y.o.   MRN: [id number]   Patient contacted with the results of her lumbar spine film and she continues to have midline low back pain but no longer radiating into her buttocks at this time. X-rays showed lumbar spondylosis. Referral will be placed for patient to follow-up with Orthopedics for further evaluation and treatment

## 2019-03-21 NOTE — Telephone Encounter (Signed)
Pt called to request her x-ray results from the date 03/19/2019. Please follow up as soon as possible

## 2019-03-22 NOTE — Telephone Encounter (Signed)
Patient was contacted by phone with results and Ortho referral placed due to continued low back pain

## 2019-03-31 DIAGNOSIS — S46012A Strain of muscle(s) and tendon(s) of the rotator cuff of left shoulder, initial encounter: Secondary | ICD-10-CM | POA: Diagnosis not present

## 2019-03-31 DIAGNOSIS — S335XXA Sprain of ligaments of lumbar spine, initial encounter: Secondary | ICD-10-CM | POA: Diagnosis not present

## 2019-04-01 ENCOUNTER — Ambulatory Visit (INDEPENDENT_AMBULATORY_CARE_PROVIDER_SITE_OTHER): Payer: Medicare PPO | Admitting: Family Medicine

## 2019-04-01 ENCOUNTER — Encounter: Payer: Self-pay | Admitting: Family Medicine

## 2019-04-01 DIAGNOSIS — G8929 Other chronic pain: Secondary | ICD-10-CM | POA: Diagnosis not present

## 2019-04-01 DIAGNOSIS — M25512 Pain in left shoulder: Secondary | ICD-10-CM | POA: Diagnosis not present

## 2019-04-01 DIAGNOSIS — M545 Low back pain, unspecified: Secondary | ICD-10-CM

## 2019-04-01 MED ORDER — HYDROCODONE-ACETAMINOPHEN 5-325 MG PO TABS: 1.0000 | ORAL_TABLET | Freq: Three times a day (TID) | ORAL | 0 refills | Status: DC | PRN

## 2019-04-01 NOTE — Progress Notes (Signed)
Office Visit Note   Patient: [name]           Date of Birth: 05/01/1951           MRN: [id number] Visit Date: 04/01/2019 Requested by: Antony Blackbird, MD [place],  [place] [id number] PCP: [name], MD  Subjective: Chief Complaint  Patient presents with  . Lower Back - Pain    Chronic pain  . Left Shoulder - Pain    [place] 03/30/19 at a wedding - stepped on her dress and fell on her left side. Went to [facility] - brought xray cd. In sling.    HPI: She is here with left shoulder and low back pain.  Shoulder was hurt acutely 2 days ago when she was at a wedding, tripped on her dress and fell landing directly on her left side.  She has been unable to reach overhead since then.  She went to [facility] yesterday and had x-rays which were negative for fracture.  She was diagnosed with possible rotator cuff tear and given a shoulder sling.  She is right-hand dominant.  She has chronic low back pain for many years.  Midline pain without sciatica.  Constant pain, worse when transitioning.  She had back surgery many years ago and her pain improved with hydrocodone but she was told she cannot take it long-term without going to a pain clinic.  She has never been to physical therapy or to a chiropractor.               ROS: No bowel or bladder dysfunction.  No fevers or chills.  All other systems were reviewed and are negative.  Objective: Vital Signs: There were no vitals taken for this visit.  Physical Exam:  General:  Alert and oriented, in no acute distress. Pulm:  Breathing unlabored. Psy:  Normal mood, congruent affect. Skin: No visible bruising. Left shoulder: She has positive drop test for supraspinatus tear.  Internal/external rotation strength still intact and minimally painful.  She is tender in the lateral subacromial space. Low back: Tender in the midline mainly near L5-S1.  No pain in the sciatic notch, straight leg raise  negative, lower extremity strength reflexes are normal.  Imaging: None today.  X-rays brought with her on CD show moderate to severe degenerative disc disease from L3-S1.  No definite acute fracture seen.  Shoulder x-rays show anatomic alignment with no definite fracture.  She has a type III acromion.    Assessment & Plan: 1.  Acute left shoulder pain status post fall with exam concerning for supraspinatus tear. -MRI to further evaluate.  She wants surgical consult if rotator cuff is torn.  2.  Chronic low back pain with degenerative disc disease -MRI to evaluate followed by epidural injection/pain clinic referral if indicated.     Procedures: No procedures performed  No notes on file     PMFS History: Patient Active Problem List   Diagnosis Date Noted  . Uncontrolled type 2 diabetes mellitus with hyperglycemia ([place]) 12/19/2018  . Microalbuminuria 04/08/2018  . Genetic testing 09/25/2017  . Family history of breast cancer   . Family history of prostate cancer   . Malignant neoplasm of upper-outer quadrant of right breast in female, estrogen receptor positive ([place]) 06/26/2017  . Lumbar herniated disc 04/15/2015  . Abnormal nuclear stress test 04/03/2015  . 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 CAD (coronary artery disease), native coronary artery 03/30/2015  . S/P  mitral valve repair 11/16/2010  . Dyslipidemia 06/29/2010  . Diabetes ([place]) 09/29/2006  . ANEMIA-IRON DEFICIENCY 09/29/2006  . Essential hypertension 09/29/2006   Past Medical History:  Diagnosis Date  . Abscess in epidural space of L2-L5 lumbar spine 03/2006  . Anemia, iron deficiency   . Arthritis   . Breast cancer ([place]) 06/12/2017   right breast  . CAD (coronary artery disease), native coronary artery 03/30/2015  . Cataract   . Colon polyp    a. Multiple colonic polyps status post colonoscopy in October 2007, consistent with tubular adenoma, tubulovillous adenoma with no high-grade dysplasia or  malignancy identified.   . Constipation   . Coronary artery disease    a. s/p CABG 06/2010: S-D2; S-PDA (at time of MV surgery).  . Depression   . Diabetes mellitus    type II  . Family history of breast cancer   . Family history of prostate cancer   . Genetic testing 09/25/2017   Multi-Cancer panel (83 genes) @ [facility] - No pathogenic mutations detected  . GERD (gastroesophageal reflux disease)   . Hx of transient ischemic attack (TIA)    a. See stroke section.  . Hyperlipidemia   . Hypertension   . Hypoxia    a. Has history of acute hypoxic respiratory failure in the setting of bronchitis/PNA or prior admissions.  [name] MRSA infection    a. History of recurrent skin infection and soft tissue abscesses, with MRSA positive in the past.  . 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 mitral valve - s/p resection and MV repair 06/2010 Dr. [name].  . 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 Personal history of radiation therapy   . Sleep apnea   . Stenosis of middle cerebral artery    a. Distal R MCA.  . Stroke [facility])    a. 11/2009: mitral mass diagnosed at this time, also has distal R MCA stenosis, tx with coumadin. b. Readmitted 05/2010 with TIA symptoms - had not been taking Coumadin. s/p MV surgery 06/2010. Coumadin stopped 2013 after review of chart by Dr. [name] since mass was removed (stroke felt possibly related to this).     Family History  Problem Relation Age of Onset  . Colon cancer Mother        dx 81s; deceased 18  . Irritable bowel syndrome Mother   . Prostate cancer Father        deceased 49s  . Cirrhosis Sister        died of GI bleed associated with cirrhosis of the liver  . Stroke Brother   . Prostate cancer Brother 50       deceased 23  . Diabetes Brother        x3  . Kidney disease Brother   . Cancer Brother 46       unk. type  . Breast cancer Cousin        several maternal 1st and 2nd cousins with breast cancer    Past Surgical History:  Procedure  Laterality Date  . BREAST LUMPECTOMY Right   . BREAST LUMPECTOMY WITH RADIOACTIVE SEED AND SENTINEL LYMPH NODE BIOPSY Right 07/20/2017   Procedure: RIGHT BREAST LUMPECTOMY WITH RADIOACTIVE SEED AND SENTINEL LYMPH NODE BIOPSY;  Surgeon: Alphonsa Overall, MD;  Location: [place];  Service: General;  Laterality: Right;  . CARDIAC CATHETERIZATION  04/03/2015   Procedure: Left Heart Cath and Cors/Grafts Angiography;  Surgeon: Peter M Martinique, MD;  Location: [place] CV LAB;  Service: Cardiovascular;;  .  CORONARY ARTERY BYPASS GRAFT  1/12  . history of ankle fractures requiring surgery    . LUMBAR LAMINECTOMY/DECOMPRESSION MICRODISCECTOMY Left 04/15/2015   Procedure: LUMBAR LAMINECTOMY/DECOMPRESSION MICRODISCECTOMY 1 LEVEL;  Surgeon: Newman Pies, MD;  Location: [place] NEURO ORS;  Service: Neurosurgery;  Laterality: Left;  Left L23 microdiskectomy  . MV repair and resection of mass  1/12  . TONSILLECTOMY  1971  . TOTAL ABDOMINAL HYSTERECTOMY  1978  . VESICOVAGINAL FISTULA CLOSURE W/ TAH     Social History   Occupational History    Employer: UNEMPLOYED  Tobacco Use  . Smoking status: Former Smoker    Quit date: 10/14/2010    Years since quitting: 8.4  . Smokeless tobacco: Never Used  Substance and Sexual Activity  . Alcohol use: No  . Drug use: No  . Sexual activity: Not Currently

## 2019-04-01 NOTE — Addendum Note (Signed)
Addended by: Hortencia Pilar on: 04/01/2019 03:13 PM   Modules accepted: Orders

## 2019-04-08 ENCOUNTER — Ambulatory Visit: Payer: Medicare PPO | Admitting: Internal Medicine

## 2019-04-10 ENCOUNTER — Telehealth: Payer: Self-pay | Admitting: Family Medicine

## 2019-04-10 MED ORDER — HYDROCODONE-ACETAMINOPHEN 5-325 MG PO TABS: 1.0000 | ORAL_TABLET | Freq: Two times a day (BID) | ORAL | 0 refills | Status: DC | PRN

## 2019-04-10 NOTE — Telephone Encounter (Signed)
Patient called needing Rx refilled Hydrocodone) The number to contact patient is [phone]

## 2019-04-10 NOTE — Telephone Encounter (Signed)
Sent!

## 2019-04-10 NOTE — Telephone Encounter (Signed)
I called and advised the patient. 

## 2019-04-10 NOTE — Telephone Encounter (Signed)
Please advise. Her MRIs are scheduled for 04/21/19.

## 2019-04-19 ENCOUNTER — Ambulatory Visit: Payer: Medicare PPO | Attending: Internal Medicine | Admitting: Internal Medicine

## 2019-04-19 ENCOUNTER — Other Ambulatory Visit: Payer: Self-pay

## 2019-04-19 ENCOUNTER — Ambulatory Visit: Payer: Medicare PPO | Attending: Internal Medicine | Admitting: Pharmacist

## 2019-04-19 ENCOUNTER — Encounter: Payer: Self-pay | Admitting: Internal Medicine

## 2019-04-19 VITALS — BP 159/81 | HR 65 | Temp 98.7°F | Resp 16 | Wt 237.8 lb

## 2019-04-19 DIAGNOSIS — D509 Iron deficiency anemia, unspecified: Secondary | ICD-10-CM | POA: Diagnosis not present

## 2019-04-19 DIAGNOSIS — Z23 Encounter for immunization: Secondary | ICD-10-CM

## 2019-04-19 DIAGNOSIS — M545 Low back pain, unspecified: Secondary | ICD-10-CM

## 2019-04-19 DIAGNOSIS — G8929 Other chronic pain: Secondary | ICD-10-CM

## 2019-04-19 DIAGNOSIS — I10 Essential (primary) hypertension: Secondary | ICD-10-CM

## 2019-04-19 DIAGNOSIS — N1831 Chronic kidney disease, stage 3a: Secondary | ICD-10-CM

## 2019-04-19 DIAGNOSIS — E1159 Type 2 diabetes mellitus with other circulatory complications: Secondary | ICD-10-CM

## 2019-04-19 DIAGNOSIS — E669 Obesity, unspecified: Secondary | ICD-10-CM

## 2019-04-19 DIAGNOSIS — M25512 Pain in left shoulder: Secondary | ICD-10-CM

## 2019-04-19 DIAGNOSIS — I25810 Atherosclerosis of coronary artery bypass graft(s) without angina pectoris: Secondary | ICD-10-CM

## 2019-04-19 LAB — GLUCOSE, POCT (MANUAL RESULT ENTRY): POC Glucose: 104 mg/dl — AB (ref 70–99)

## 2019-04-19 LAB — POCT GLYCOSYLATED HEMOGLOBIN (HGB A1C): HbA1c, POC (controlled diabetic range): 7.3 % — AB (ref 0.0–7.0)

## 2019-04-19 NOTE — Patient Instructions (Addendum)
Your diabetes has improved.  Please keep up the good work and continue to work on improving her eating habits.  Your blood pressure is elevated.  Please take your blood pressure medications as soon as you return home  Influenza Virus Vaccine injection (Fluarix) What is this medicine? INFLUENZA VIRUS VACCINE (in floo EN zuh VAHY ruhs vak SEEN) helps to reduce the risk of getting influenza also known as the flu. This medicine may be used for other purposes; ask your health care provider or pharmacist if you have questions. COMMON BRAND NAME(S): Fluarix, Fluzone What should I tell my health care provider before I take this medicine? They need to know if you have any of these conditions:  bleeding disorder like hemophilia  fever or infection  Guillain-Barre syndrome or other neurological problems  immune system problems  infection with the human immunodeficiency virus (HIV) or AIDS  low blood platelet counts  multiple sclerosis  an unusual or allergic reaction to influenza virus vaccine, eggs, chicken proteins, latex, gentamicin, other medicines, foods, dyes or preservatives  pregnant or trying to get pregnant  breast-feeding How should I use this medicine? This vaccine is for injection into a muscle. It is given by a health care professional. A copy of Vaccine Information Statements will be given before each vaccination. Read this sheet carefully each time. The sheet may change frequently. Talk to your pediatrician regarding the use of this medicine in children. Special care may be needed. Overdosage: If you think you have taken too much of this medicine contact a poison control center or emergency room at once. NOTE: This medicine is only for you. Do not share this medicine with others. What if I miss a dose? This does not apply. What may interact with this medicine?  chemotherapy or radiation therapy  medicines that lower your immune system like etanercept, anakinra,  infliximab, and adalimumab  medicines that treat or prevent blood clots like warfarin  phenytoin  steroid medicines like prednisone or cortisone  theophylline  vaccines This list may not describe all possible interactions. Give your health care provider a list of all the medicines, herbs, non-prescription drugs, or dietary supplements you use. Also tell them if you smoke, drink alcohol, or use illegal drugs. Some items may interact with your medicine. What should I watch for while using this medicine? Report any side effects that do not go away within 3 days to your doctor or health care professional. Call your health care provider if any unusual symptoms occur within 6 weeks of receiving this vaccine. You may still catch the flu, but the illness is not usually as bad. You cannot get the flu from the vaccine. The vaccine will not protect against colds or other illnesses that may cause fever. The vaccine is needed every year. What side effects may I notice from receiving this medicine? Side effects that you should report to your doctor or health care professional as soon as possible:  allergic reactions like skin rash, itching or hives, swelling of the face, lips, or tongue Side effects that usually do not require medical attention (report to your doctor or health care professional if they continue or are bothersome):  fever  headache  muscle aches and pains  pain, tenderness, redness, or swelling at site where injected  weak or tired This list may not describe all possible side effects. Call your doctor for medical advice about side effects. You may report side effects to FDA at 1-800-FDA-1088. Where should I keep my medicine?  This vaccine is only given in a clinic, pharmacy, doctor's office, or other health care setting and will not be stored at home. NOTE: This sheet is a summary. It may not cover all possible information. If you have questions about this medicine, talk to your  doctor, pharmacist, or health care provider.  2020 Elsevier/Gold Standard (2008-01-09 09:30:40)

## 2019-04-19 NOTE — Progress Notes (Signed)
Patient ID: [name], female    DOB: [birth year]  MRN: [id number]  CC: Diabetes and Hypertension  Started 10:47 p.m  Subjective: [name] is a 68 y.o. female who presents for chronic ds management Her concerns today include:  Pt with hx of HTN, DM, HL, depression, chronic back pain, RT breast CA (intraductal CA, ER/PR+, lumpectomy and XRT- completed 08/2017), CKD stage 3, CVA 2011 (residual weakness on LT side), CAD s/p CABG andMVR/resection of fibroelastoma, IDA.   Lt shoulder pain:  [place] Oct [place] while putting on address.  She tripped over the edge of it landing on her left side.  She has since seen orthopedics Dr. Junius Roads.  He thinks she may have rotator cuff injury and is ordered an MRI.  In regards to her chronic back pain, he is also ordered an MRI of the lumbar spine.  He is prescribed to him hydrocodone for her to take as needed for pain.  HTN/CAD:  Did not take meds as yet for the morning Reports compliance with meds and salt restriction No CP/SOB/no inc LE edema  DM:  A1c dec from 8.1 to 7.3 today She has cut back on eating sweet foods Compliant with meds No recent low BS Checking BS BID.  Gives range 91-123  IDA:  Taking iron supplement daily as prescribed.  No dizziness or fatigue..   Patient Active Problem List   Diagnosis Date Noted  . Uncontrolled type 2 diabetes mellitus with hyperglycemia ([place]) 12/19/2018  . Microalbuminuria 04/08/2018  . Genetic testing 09/25/2017  . Family history of breast cancer   . Family history of prostate cancer   . Malignant neoplasm of upper-outer quadrant of right breast in female, estrogen receptor positive ([place]) 06/26/2017  . Lumbar herniated disc 04/15/2015  . Abnormal nuclear stress test 04/03/2015  . 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 CAD (coronary artery disease), native coronary artery 03/30/2015  . S/P mitral valve repair 11/16/2010  . Dyslipidemia 06/29/2010  . Diabetes ([place]) 09/29/2006  .  ANEMIA-IRON DEFICIENCY 09/29/2006  . Essential hypertension 09/29/2006     Current Outpatient Medications on File Prior to Visit  Medication Sig Dispense Refill  . ACCU-CHEK SOFTCLIX LANCETS lancets Use as instructed 100 each 12  . acetaminophen (ACETAMINOPHEN EXTRA STRENGTH) 500 MG tablet Take 1 tablet (500 mg total) by mouth every 8 (eight) hours as needed. 100 tablet 0  . albuterol (PROVENTIL HFA;VENTOLIN HFA) 108 (90 Base) MCG/ACT inhaler Inhale 2 puffs into the lungs every 6 (six) hours as needed for wheezing or shortness of breath. 1 Inhaler 2  . amLODipine (NORVASC) 10 MG tablet Take 1 tablet (10 mg total) by mouth daily. 90 tablet 2  . aspirin EC 81 MG tablet Take 1 tablet (81 mg total) by mouth daily. 30 tablet 5  . atorvastatin (LIPITOR) 40 MG tablet Take 1.5 tablets (60 mg total) by mouth daily. 135 tablet 2  . Blood Glucose Monitoring Suppl (ACCU-CHEK AVIVA PLUS) w/Device KIT Use as directed 1 kit 0  . buPROPion (WELLBUTRIN XL) 150 MG 24 hr tablet Take 1 tablet (150 mg total) by mouth 2 (two) times daily. 180 tablet 2  . carvedilol (COREG) 6.25 MG tablet Take 1 tablet (6.25 mg total) by mouth 2 (two) times daily with a meal. 180 tablet 3  . diclofenac sodium (VOLTAREN) 1 % GEL Apply 2 g topically 4 (four) times daily as needed. 100 g 1  . FEROSUL 325 (65 Fe) MG tablet TAKE 1 TABLET (325  MG TOTAL) BY MOUTH DAILY WITH BREAKFAST. 90 tablet 0  . glipiZIDE (GLUCOTROL) 5 MG tablet TAKE 1 TABLET DAILY BEFORE BREAKFAST 90 tablet 1  . glucose blood (ACCU-CHEK AVIVA PLUS) test strip Use as instructed 100 each 12  . hydrochlorothiazide (HYDRODIURIL) 25 MG tablet TAKE 1 TABLET (25 MG TOTAL) BY MOUTH DAILY. 90 tablet 0  . HYDROcodone-acetaminophen (NORCO/VICODIN) 5-325 MG tablet Take 1 tablet by mouth 2 (two) times daily as needed for moderate pain. 20 tablet 0  . letrozole (FEMARA) 2.5 MG tablet Take 1 tablet (2.5 mg total) by mouth daily. 90 tablet 3  . linagliptin (TRADJENTA) 5 MG TABS tablet  Take 1 tablet (5 mg total) by mouth daily. 90 tablet 1  . losartan (COZAAR) 100 MG tablet TAKE 1 TABLET (100 MG TOTAL) BY MOUTH DAILY. 90 tablet 0  . metFORMIN (GLUCOPHAGE) 1000 MG tablet TAKE 1 TABLET TWICE DAILY WITH MEALS 180 tablet 0  . methocarbamol (ROBAXIN) 500 MG tablet Take 1 tablet (500 mg total) by mouth every 8 (eight) hours as needed for muscle spasms. /back pain 270 tablet 0  . pantoprazole (PROTONIX) 40 MG tablet Take 1 tablet (40 mg total) by mouth daily. 90 tablet 3  . polyethylene glycol (MIRALAX / GLYCOLAX) 17 g packet Take 17 g by mouth daily as needed for mild constipation or moderate constipation. 30 each 5  . traZODone (DESYREL) 50 MG tablet Take 1 tablet (50 mg total) by mouth at bedtime as needed. for sleep 90 tablet 0   No current facility-administered medications on file prior to visit.     No Known Allergies  Social History   Socioeconomic History  . Marital status: Married    Spouse name: Not on file  . Number of children: 3  . Years of education: Not on file  . Highest education level: Not on file  Occupational History    Employer: UNEMPLOYED  Social Needs  . Financial resource strain: Not on file  . Food insecurity    Worry: Not on file    Inability: Not on file  . Transportation needs    Medical: Not on file    Non-medical: Not on file  Tobacco Use  . Smoking status: Former Smoker    Quit date: 10/14/2010    Years since quitting: 8.5  . Smokeless tobacco: Never Used  Substance and Sexual Activity  . Alcohol use: No  . Drug use: No  . Sexual activity: Not Currently  Lifestyle  . Physical activity    Days per week: Not on file    Minutes per session: Not on file  . Stress: Not on file  Relationships  . Social Herbalist on phone: Not on file    Gets together: Not on file    Attends religious service: Not on file    Active member of club or organization: Not on file    Attends meetings of clubs or organizations: Not on file     Relationship status: Not on file  . Intimate partner violence    Fear of current or ex partner: Not on file    Emotionally abused: Not on file    Physically abused: Not on file    Forced sexual activity: Not on file  Other Topics Concern  . Not on file  Social History Narrative   Lives with husband, stay at home, uses cane occasionally, still active/ambulatory.     Family History  Problem Relation Age of Onset  .  Colon cancer Mother        dx 36s; deceased 28  . Irritable bowel syndrome Mother   . Prostate cancer Father        deceased 73s  . Cirrhosis Sister        died of GI bleed associated with cirrhosis of the liver  . Stroke Brother   . Prostate cancer Brother 54       deceased 41  . Diabetes Brother        x3  . Kidney disease Brother   . Cancer Brother 64       unk. type  . Breast cancer Cousin        several maternal 1st and 2nd cousins with breast cancer    Past Surgical History:  Procedure Laterality Date  . BREAST LUMPECTOMY Right   . BREAST LUMPECTOMY WITH RADIOACTIVE SEED AND SENTINEL LYMPH NODE BIOPSY Right 07/20/2017   Procedure: RIGHT BREAST LUMPECTOMY WITH RADIOACTIVE SEED AND SENTINEL LYMPH NODE BIOPSY;  Surgeon: Alphonsa Overall, MD;  Location: [facility];  Service: General;  Laterality: Right;  . CARDIAC CATHETERIZATION  04/03/2015   Procedure: Left Heart Cath and Cors/Grafts Angiography;  Surgeon: Peter M Martinique, MD;  Location: [facility];  Service: Cardiovascular;;  . CORONARY ARTERY BYPASS GRAFT  1/12  . history of ankle fractures requiring surgery    . LUMBAR LAMINECTOMY/DECOMPRESSION MICRODISCECTOMY Left 04/15/2015   Procedure: LUMBAR LAMINECTOMY/DECOMPRESSION MICRODISCECTOMY 1 LEVEL;  Surgeon: Newman Pies, MD;  Location: [facility];  Service: Neurosurgery;  Laterality: Left;  Left L23 microdiskectomy  . MV repair and resection of mass  1/12  . TONSILLECTOMY  1971  . TOTAL ABDOMINAL HYSTERECTOMY  1978  . VESICOVAGINAL FISTULA CLOSURE W/ TAH       ROS: Review of Systems Negative except as stated above  PHYSICAL EXAM: BP (!) 159/81   Pulse 65   Temp 98.7 F (37.1 C) (Oral)   Resp 16   Wt 237 lb 12.8 oz (107.9 kg)   SpO2 100%   BMI 36.70 kg/m   Wt Readings from Last 3 Encounters:  04/19/19 237 lb 12.8 oz (107.9 kg)  12/19/18 220 lb (99.8 kg)  09/14/18 237 lb 9.6 oz (107.8 kg)    Physical Exam  General appearance - alert, well appearing, and in no distress Mental status - normal mood, behavior, speech, dress, motor activity, and thought processes Neck - supple, no significant adenopathy Chest - clear to auscultation, no wheezes, rales or rhonchi, symmetric air entry Heart - normal rate, regular rhythm, normal S1, S2, no murmurs, rubs, clicks or gallops Extremities - peripheral pulses normal, no pedal edema, no clubbing or cyanosis Diabetic Foot Exam - Simple   Simple Foot Form Visual Inspection No deformities, no ulcerations, no other skin breakdown bilaterally: Yes Sensation Testing Intact to touch and monofilament testing bilaterally: Yes Pulse Check Posterior Tibialis and Dorsalis pulse intact bilaterally: Yes Comments      CMP Latest Ref Rng & Units 09/14/2018 11/09/2017 07/14/2017  Glucose 65 - 99 mg/dL 105(H) 196(H) 189(H)  BUN 8 - 27 mg/dL 21 15 22(H)  Creatinine 0.57 - 1.00 mg/dL 1.22(H) 1.42(H) 1.40(H)  Sodium 134 - 144 mmol/L 139 138 139  Potassium 3.5 - 5.2 mmol/L 4.5 4.8 4.4  Chloride 96 - 106 mmol/L 103 100 105  CO2 20 - 29 mmol/L '21 22 23  '$ Calcium 8.7 - 10.3 mg/dL 9.2 9.6 9.5  Total Protein 6.0 - 8.5 g/dL 6.8 6.4 -  Total Bilirubin  0.0 - 1.2 mg/dL 0.4 0.4 -  Alkaline Phos 39 - 117 IU/L 54 70 -  AST 0 - 40 IU/L 14 14 -  ALT 0 - 32 IU/L 9 13 -   Lipid Panel     Component Value Date/Time   CHOL 235 (H) 09/14/2018 1559   TRIG 132 09/14/2018 1559   HDL 93 09/14/2018 1559   CHOLHDL 2.5 09/14/2018 1559   CHOLHDL 2.8 04/28/2016 1028   VLDL 19 04/28/2016 1028   LDLCALC 116 (H) 09/14/2018  1559   LDLDIRECT 101.7 07/18/2011 0901    CBC    Component Value Date/Time   WBC 5.1 09/14/2018 1559   WBC 8.9 07/14/2017 0930   RBC 4.47 09/14/2018 1559   RBC 4.25 07/14/2017 0930   HGB 11.7 09/14/2018 1559   HCT 37.1 09/14/2018 1559   PLT 238 09/14/2018 1559   MCV 83 09/14/2018 1559   MCH 26.2 (L) 09/14/2018 1559   MCH 25.6 (L) 07/14/2017 0930   MCHC 31.5 09/14/2018 1559   MCHC 31.5 07/14/2017 0930   RDW 14.1 09/14/2018 1559   LYMPHSABS 2,541 04/28/2016 1028   MONOABS 385 04/28/2016 1028   EOSABS 77 04/28/2016 1028   BASOSABS 0 04/28/2016 1028   Results for orders placed or performed in visit on 04/19/19  POCT glucose (manual entry)  Result Value Ref Range   POC Glucose 104 (A) 70 - 99 mg/dl  POCT glycosylated hemoglobin (Hb A1C)  Result Value Ref Range   Hemoglobin A1C     HbA1c POC (<> result, manual entry)     HbA1c, POC (prediabetic range)     HbA1c, POC (controlled diabetic range) 7.3 (A) 0.0 - 7.0 %    ASSESSMENT AND PLAN: 1. Controlled type 2 diabetes mellitus with other circulatory complication, without long-term current use of insulin ([place]) Reported blood sugars are at goal.  A1c is improved.  She will continue current medications including Tradjenta, Metformin and glipizide.  Encouraged her to continue healthy eating habits and regular exercise - POCT glucose (manual entry) - POCT glycosylated hemoglobin (Hb A1C) - Microalbumin / creatinine urine ratio - Basic Metabolic Panel - CBC  2. Essential hypertension Not at goal but patient has not taken medicines as yet for today.  She will continue her current medications and low-salt diet.  Medications include amlodipine, Cozaar, hydrochlorothiazide and carvedilol  3. Coronary artery disease involving coronary bypass graft of native heart without angina pectoris Clinically stable.  Continue carvedilol, aspirin, Lipitor  4. Obesity (BMI 30-39.9) See #1 above  5. Stage 3a chronic kidney disease GFR had  improved when last checked.  Advised to avoid NSAIDs  6. Iron deficiency anemia, unspecified iron deficiency anemia type - CBC  7. Acute pain of left shoulder Followed by Ortho  8. Chronic midline low back pain without sciatica Being worked up by orthopedics  9. Need for influenza vaccination Given     Patient was given the opportunity to ask questions.  Patient verbalized understanding of the plan and was able to repeat key elements of the plan.   Orders Placed This Encounter  Procedures  . Microalbumin / creatinine urine ratio  . Basic Metabolic Panel  . CBC  . POCT glucose (manual entry)  . POCT glycosylated hemoglobin (Hb A1C)     Requested Prescriptions    No prescriptions requested or ordered in this encounter    Return in about 3 months (around 07/20/2019).  Karle Plumber, MD, FACP

## 2019-04-19 NOTE — Progress Notes (Signed)
Patient presents for vaccination against influenza per orders of Dr. Johnson. Consent given. Counseling provided. No contraindications exists. Vaccine administered without incident.   

## 2019-04-21 ENCOUNTER — Other Ambulatory Visit: Payer: Self-pay

## 2019-04-21 ENCOUNTER — Ambulatory Visit
Admission: RE | Admit: 2019-04-21 | Discharge: 2019-04-21 | Disposition: A | Discharge status: Home / Self Care | Payer: Medicare PPO | Source: Ambulatory Visit | Attending: Family Medicine | Admitting: Family Medicine

## 2019-04-21 DIAGNOSIS — M25512 Pain in left shoulder: Secondary | ICD-10-CM

## 2019-04-21 DIAGNOSIS — M545 Low back pain, unspecified: Secondary | ICD-10-CM

## 2019-04-21 DIAGNOSIS — S46012A Strain of muscle(s) and tendon(s) of the rotator cuff of left shoulder, initial encounter: Secondary | ICD-10-CM | POA: Diagnosis not present

## 2019-04-21 DIAGNOSIS — G8929 Other chronic pain: Secondary | ICD-10-CM

## 2019-04-21 DIAGNOSIS — M48061 Spinal stenosis, lumbar region without neurogenic claudication: Secondary | ICD-10-CM | POA: Diagnosis not present

## 2019-04-22 LAB — BASIC METABOLIC PANEL
BUN/Creatinine Ratio: 9 — ABNORMAL LOW (ref 12–28)
BUN: 11 mg/dL (ref 8–27)
CO2: 24 mmol/L (ref 20–29)
Calcium: 9.6 mg/dL (ref 8.7–10.3)
Chloride: 104 mmol/L (ref 96–106)
Creatinine, Ser: 1.18 mg/dL — ABNORMAL HIGH (ref 0.57–1.00)
GFR calc Af Amer: 54 mL/min/{1.73_m2} — ABNORMAL LOW (ref >59)
GFR calc non Af Amer: 47 mL/min/{1.73_m2} — ABNORMAL LOW (ref >59)
Glucose: 76 mg/dL (ref 65–99)
Potassium: 4.6 mmol/L (ref 3.5–5.2)
Sodium: 141 mmol/L (ref 134–144)

## 2019-04-22 LAB — CBC
Hematocrit: 36.3 % (ref 34.0–46.6)
Hemoglobin: 11.6 g/dL (ref 11.1–15.9)
MCH: 26.2 pg — ABNORMAL LOW (ref 26.6–33.0)
MCHC: 32 g/dL (ref 31.5–35.7)
MCV: 82 fL (ref 79–97)
Platelets: 279 10*3/uL (ref 150–450)
RBC: 4.43 x10E6/uL (ref 3.77–5.28)
RDW: 13.2 % (ref 11.7–15.4)
WBC: 5.9 10*3/uL (ref 3.4–10.8)

## 2019-04-22 LAB — MICROALBUMIN / CREATININE URINE RATIO
Creatinine, Urine: 173.1 mg/dL
Microalb/Creat Ratio: 300 mg/g creat — ABNORMAL HIGH (ref 0–29)
Microalbumin, Urine: 519.9 ug/mL

## 2019-04-23 ENCOUNTER — Telehealth: Payer: Self-pay | Admitting: Family Medicine

## 2019-04-23 ENCOUNTER — Telehealth: Payer: Self-pay

## 2019-04-23 NOTE — Telephone Encounter (Signed)
Shoulder MRI shows a large rotator cuff tear which is pulled away from the bone by 3 cm.  This will need to be repaired.  Lumbar MRI shows several bulging discs with arthritic bone spurs, and these things cause moderate to severe narrowing of the spinal canal and nerve openings.    Please refer to CB for left shoulder surgical options.  For low back, options include:  - Referral to physical therapy.  - Referral to FN for lumbar epidural steroid injections.  - If she would prefer management with pain medication, then we can refer her to pain clinic instead.  - Surgical consult if the above treatments don't help.

## 2019-04-23 NOTE — Telephone Encounter (Signed)
Contacted pt to go over lab results pt is aware and doesn't have any questions or concerns 

## 2019-04-24 NOTE — Telephone Encounter (Signed)
Left message on the patient's voice mail to call back for results.

## 2019-04-25 ENCOUNTER — Telehealth: Payer: Self-pay | Admitting: Family Medicine

## 2019-04-25 MED ORDER — HYDROCODONE-ACETAMINOPHEN 5-325 MG PO TABS: 1.0000 | ORAL_TABLET | Freq: Two times a day (BID) | ORAL | 0 refills | Status: DC | PRN

## 2019-04-25 NOTE — Telephone Encounter (Signed)
Please advise. She has been taking Norco 5/325 bid prn (she does not know her MRI results yet - I had to leave a message to call back yesterday).

## 2019-04-25 NOTE — Telephone Encounter (Signed)
Refill sent.

## 2019-04-25 NOTE — Telephone Encounter (Signed)
Left another message on the patient's voice mail to call back regarding the results. Her pain medication was sent in to her pharmacy.

## 2019-04-25 NOTE — Telephone Encounter (Signed)
Left a message on the patient's voice mail - med was sent in - needs to call back for the results.

## 2019-04-25 NOTE — Telephone Encounter (Signed)
Patient called. She would like some pain medication called in for her. Also would like to know the results of her MRI. Her call back number is [phone]

## 2019-04-26 ENCOUNTER — Telehealth: Payer: Self-pay | Admitting: Family Medicine

## 2019-04-26 DIAGNOSIS — M545 Low back pain: Secondary | ICD-10-CM | POA: Diagnosis not present

## 2019-04-26 NOTE — Telephone Encounter (Signed)
I called and advised the patient of her results. She is scheduled to see Dr. Ninfa Linden on 05/06/19 for her RCT.    The patient's sister was listening in on the phone call and said "PT is a bandaid and the injections don't work," so the patient would like to see a Psychologist, sport and exercise for her back.

## 2019-04-26 NOTE — Telephone Encounter (Signed)
Patient called stating that she would like to talk to Dr. Junius Roads in regards to the results of her MRI.  She stated that she is still having pain in her shoulder and what she needs to do to move forward.  CB#[phone].  Thank you.

## 2019-04-26 NOTE — Telephone Encounter (Signed)
I called and spoke with the patient - see other message that includes the results from Dr. Junius Roads.

## 2019-04-27 NOTE — Telephone Encounter (Signed)
Ok, please schedule with JN or MY to discuss surgical options.

## 2019-04-29 ENCOUNTER — Telehealth: Payer: Self-pay

## 2019-04-29 NOTE — Telephone Encounter (Signed)
Contacted pt to go over urine results pt is aware and doesn't have any questions or concerns

## 2019-04-30 ENCOUNTER — Other Ambulatory Visit: Payer: Self-pay

## 2019-04-30 ENCOUNTER — Encounter: Payer: Self-pay | Admitting: Family Medicine

## 2019-04-30 ENCOUNTER — Ambulatory Visit (INDEPENDENT_AMBULATORY_CARE_PROVIDER_SITE_OTHER): Payer: Medicare PPO | Admitting: Family Medicine

## 2019-04-30 DIAGNOSIS — M545 Low back pain, unspecified: Secondary | ICD-10-CM

## 2019-04-30 DIAGNOSIS — M25512 Pain in left shoulder: Secondary | ICD-10-CM

## 2019-04-30 DIAGNOSIS — G8929 Other chronic pain: Secondary | ICD-10-CM | POA: Diagnosis not present

## 2019-04-30 MED ORDER — BACLOFEN 10 MG PO TABS: 5.0000 mg | ORAL_TABLET | Freq: Three times a day (TID) | ORAL | 3 refills | Status: DC | PRN

## 2019-04-30 NOTE — Progress Notes (Signed)
Office Visit Note   Patient: [name]           Date of Birth: 06-21-1951           MRN: [id number] Visit Date: 04/30/2019 Requested by: [name], MD [place],  [place] [id number] PCP: [name], MD  Subjective: Chief Complaint  Patient presents with  . Lower Back - Pain    Increased pain in the lower back since 04/26/19. Was sitting on a bench that then broke and she hit the floor (landing on knees). She thinks she hit her left arm also (was in a sling).    HPI: She is here with worsening left shoulder and low back pain.  On October 30 she was at a restaurant and sitting on a bench seat at the table.  She scooted to the edge to wave toward somebody and the seat tipped over.  She fell to the ground landing on her knees and had immediate increased pain in her left shoulder and her low back.  She eventually went to [facility] urgent care where x-rays were obtained and were reportedly negative for acute fracture.  She has been using her left shoulder sling and taking Robaxin for muscle spasm.  Her pain does not seem to be getting much better.  She is scheduled to meet with Dr. Ninfa Linden next week to discuss left shoulder rotator cuff repair.  She has not yet gotten an appointment with a spine surgeon to discuss surgery for her lumbar stenosis.  Pain pattern has not changed, but is more intense.               ROS: No fevers/chills.  All other systems were reviewed and are negative.  Objective: Vital Signs: There were no vitals taken for this visit.  Physical Exam:  General:  Alert and oriented, in no acute distress. Pulm:  Breathing unlabored. Psy:  Normal mood, congruent affect. Skin:  No rash or bruising seen.  Left shoulder:  Very limited active ROM. Low back:  Muscular tenderness in the right mid-lumbar paraspinous muscles.   Imaging: None today.  Assessment & Plan: 1.  Flare-up left shoulder pain with known RCT. - Follow with  Dr. Colleen Can as scheduled.  2.  Flare-up low back pain - Surgical consult. - Baclofen as needed.     Procedures: No procedures performed  No notes on file     PMFS History: Patient Active Problem List   Diagnosis Date Noted  . Uncontrolled type 2 diabetes mellitus with hyperglycemia ([place]) 12/19/2018  . Microalbuminuria 04/08/2018  . Genetic testing 09/25/2017  . Family history of breast cancer   . Family history of prostate cancer   . Malignant neoplasm of upper-outer quadrant of right breast in female, estrogen receptor positive ([place]) 06/26/2017  . Lumbar herniated disc 04/15/2015  . Abnormal nuclear stress test 04/03/2015  . 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 CAD (coronary artery disease), native coronary artery 03/30/2015  . S/P mitral valve repair 11/16/2010  . Dyslipidemia 06/29/2010  . Diabetes ([place]) 09/29/2006  . ANEMIA-IRON DEFICIENCY 09/29/2006  . Essential hypertension 09/29/2006   Past Medical History:  Diagnosis Date  . Abscess in epidural space of L2-L5 lumbar spine 03/2006  . Anemia, iron deficiency   . Arthritis   . Breast cancer ([place]) 06/12/2017   right breast  . CAD (coronary artery disease), native coronary artery 03/30/2015  . Cataract   . Colon polyp    a.  Multiple colonic polyps status post colonoscopy in October 2007, consistent with tubular adenoma, tubulovillous adenoma with no high-grade dysplasia or malignancy identified.   . Constipation   . Coronary artery disease    a. s/p CABG 06/2010: S-D2; S-PDA (at time of MV surgery).  . Depression   . Diabetes mellitus    type II  . Family history of breast cancer   . Family history of prostate cancer   . Genetic testing 09/25/2017   Multi-Cancer panel (83 genes) @ [facility] - No pathogenic mutations detected  . GERD (gastroesophageal reflux disease)   . Hx of transient ischemic attack (TIA)    a. See stroke section.  . Hyperlipidemia   . Hypertension   . Hypoxia    a. Has history  of acute hypoxic respiratory failure in the setting of bronchitis/PNA or prior admissions.  [name] MRSA infection    a. History of recurrent skin infection and soft tissue abscesses, with MRSA positive in the past.  . 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 mitral valve - s/p resection and MV repair 06/2010 Dr. [name].  . 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 Personal history of radiation therapy   . Sleep apnea   . Stenosis of middle cerebral artery    a. Distal R MCA.  . Stroke [facility])    a. 11/2009: mitral mass diagnosed at this time, also has distal R MCA stenosis, tx with coumadin. b. Readmitted 05/2010 with TIA symptoms - had not been taking Coumadin. s/p MV surgery 06/2010. Coumadin stopped 2013 after review of chart by Dr. [name] since mass was removed (stroke felt possibly related to this).     Family History  Problem Relation Age of Onset  . Colon cancer Mother        dx 70s; deceased 27  . Irritable bowel syndrome Mother   . Prostate cancer Father        deceased 57s  . Cirrhosis Sister        died of GI bleed associated with cirrhosis of the liver  . Stroke Brother   . Prostate cancer Brother 52       deceased 62  . Diabetes Brother        x3  . Kidney disease Brother   . Cancer Brother 64       unk. type  . Breast cancer Cousin        several maternal 1st and 2nd cousins with breast cancer    Past Surgical History:  Procedure Laterality Date  . BREAST LUMPECTOMY Right   . BREAST LUMPECTOMY WITH RADIOACTIVE SEED AND SENTINEL LYMPH NODE BIOPSY Right 07/20/2017   Procedure: RIGHT BREAST LUMPECTOMY WITH RADIOACTIVE SEED AND SENTINEL LYMPH NODE BIOPSY;  Surgeon: Alphonsa Overall, MD;  Location: [facility];  Service: General;  Laterality: Right;  . CARDIAC CATHETERIZATION  04/03/2015   Procedure: Left Heart Cath and Cors/Grafts Angiography;  Surgeon: Peter M Martinique, MD;  Location: [facility];  Service: Cardiovascular;;  . CORONARY ARTERY BYPASS GRAFT  1/12  . history  of ankle fractures requiring surgery    . LUMBAR LAMINECTOMY/DECOMPRESSION MICRODISCECTOMY Left 04/15/2015   Procedure: LUMBAR LAMINECTOMY/DECOMPRESSION MICRODISCECTOMY 1 LEVEL;  Surgeon: Newman Pies, MD;  Location: [facility];  Service: Neurosurgery;  Laterality: Left;  Left L23 microdiskectomy  . MV repair and resection of mass  1/12  . TONSILLECTOMY  1971  . TOTAL ABDOMINAL HYSTERECTOMY  1978  . VESICOVAGINAL FISTULA CLOSURE W/ TAH  Social History   Occupational History    Employer: UNEMPLOYED  Tobacco Use  . Smoking status: Former Smoker    Quit date: 10/14/2010    Years since quitting: 8.5  . Smokeless tobacco: Never Used  Substance and Sexual Activity  . Alcohol use: No  . Drug use: No  . Sexual activity: Not Currently

## 2019-04-30 NOTE — Telephone Encounter (Signed)
Patient was seen in the office today. Appointment scheduled with Dr. [name] while she was here.

## 2019-05-01 ENCOUNTER — Other Ambulatory Visit: Payer: Self-pay

## 2019-05-01 MED ORDER — LETROZOLE 2.5 MG PO TABS: 2.5000 mg | ORAL_TABLET | Freq: Every day | ORAL | 3 refills | Status: DC

## 2019-05-06 ENCOUNTER — Ambulatory Visit (INDEPENDENT_AMBULATORY_CARE_PROVIDER_SITE_OTHER): Payer: Medicare PPO | Admitting: Orthopaedic Surgery

## 2019-05-06 ENCOUNTER — Telehealth: Payer: Self-pay | Admitting: Family Medicine

## 2019-05-06 ENCOUNTER — Encounter: Payer: Self-pay | Admitting: Orthopaedic Surgery

## 2019-05-06 ENCOUNTER — Other Ambulatory Visit: Payer: Self-pay

## 2019-05-06 ENCOUNTER — Other Ambulatory Visit: Payer: Self-pay | Admitting: Orthopaedic Surgery

## 2019-05-06 DIAGNOSIS — S46012A Strain of muscle(s) and tendon(s) of the rotator cuff of left shoulder, initial encounter: Secondary | ICD-10-CM | POA: Diagnosis not present

## 2019-05-06 MED ORDER — HYDROCODONE-ACETAMINOPHEN 5-325 MG PO TABS: 1.0000 | ORAL_TABLET | Freq: Two times a day (BID) | ORAL | 0 refills | Status: DC | PRN

## 2019-05-06 MED ORDER — LETROZOLE 2.5 MG PO TABS: 2.5000 mg | ORAL_TABLET | Freq: Every day | ORAL | 3 refills | Status: DC

## 2019-05-06 NOTE — Progress Notes (Signed)
Office Visit Note   Patient: [name]           Date of Birth: 23-Aug-1950           MRN: [id number] Visit Date: 05/06/2019              Requested by: [name], MD [id number] [place],  [place] [id number] PCP: [name], MD   Assessment & Plan: Visit Diagnoses:  1. Traumatic complete tear of left rotator cuff, initial encounter     Plan: I showed a shoulder model to the patient explained in detail what rotator cuff surgery involves.  We had a long and thorough discussion about her interoperative and postoperative course.  We discussed the risk and benefits of surgery.  We talked about nonoperative and operative treatment measures.  Given that she had had no weakness before now that she does and given the fact that her rotator cuff is showing a complete tear, I do feel that arthroscopic intervention can be helpful.  She understands that the nature of the repair will be determined intraoperatively if we are able to repair it depends on the collagen fibers and integrity of her rotator cuff.  I explained in detail what this means and involves.  She is being having the surgery scheduled and I do feel that is warranted based on her MRI findings and her clinical exam.  All question concerns were answered and addressed.  We will work on getting her on the schedule.  We would then see her back in 1 week postoperative for suture removal and setting her up for outpatient physical therapy.  Follow-Up Instructions: Return for 1 week post-op.   Orders:  No orders of the defined types were placed in this encounter.  No orders of the defined types were placed in this encounter.     Procedures: No procedures performed   Clinical Data: No additional findings.   Subjective: Chief Complaint  Patient presents with  . Left Shoulder - Follow-up  The patient is a 68 year old left-hand-dominant female who was referred to me from Dr. Junius Roads to evaluate and treat a  complete full-thickness rotator cuff tear of the left shoulder.  She has had no previous shoulder issues until she sustained a mechanical fall in a restaurant on October 3.  After continued difficulty with pain and weakness in the left shoulder and a MRI was obtained on October 25.  I have this for my review today.  It does show a full-thickness retracted rotator cuff tear of only the supraspinatus tendon.  There is some edema in the muscle suggesting an acute tear.  There is only some slight atrophy.  This appears to be more acute in nature.  She is wearing a sling today.  She does report pain a diabetic with a hemoglobin A1c of below 7.5.  She denies any other acute medical issues.  Her sister is with her today who is her advocate since there is an attorney involved as well.  HPI  Review of Systems She currently denies any headache, chest pain, shortness of breath, fever, chills, nausea, vomiting  Objective: Vital Signs: There were no vitals taken for this visit.  Physical Exam She is alert and orient x3 and in no acute distress Ortho Exam Examination of her left shoulder does show that she uses her deltoids to abduct her shoulder.  She can hold it at 90 degrees abduction but there is weakness when I stressed the shoulder.  Clinically is well located.  Her external rotation has some limitations and weakness.  Her liftoff is negative but there is limitations in her internal rotation with adduction. Specialty Comments:  No specialty comments available.  Imaging: No results found. The MRI is independently reviewed and does show a full-thickness tear of the supraspinatus tendon.  There is some retraction from this tendon.  There is definitely edema in the shoulder showing this is more of an acute injury.  There is potentially some chronic nature of this due to some muscle atrophy however the MRI was several weeks after her injury.  I do feel that the edema is indicative of an acute event.  The  remainder of the shoulder shows no other acute findings.  The cartilage is well-maintained at the glenohumeral joint.  PMFS History: Patient Active Problem List   Diagnosis Date Noted  . Uncontrolled type 2 diabetes mellitus with hyperglycemia ([place]) 12/19/2018  . Microalbuminuria 04/08/2018  . Genetic testing 09/25/2017  . Family history of breast cancer   . Family history of prostate cancer   . Malignant neoplasm of upper-outer quadrant of right breast in female, estrogen receptor positive ([place]) 06/26/2017  . Lumbar herniated disc 04/15/2015  . Abnormal nuclear stress test 04/03/2015  . 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 CAD (coronary artery disease), native coronary artery 03/30/2015  . S/P mitral valve repair 11/16/2010  . Dyslipidemia 06/29/2010  . Diabetes ([place]) 09/29/2006  . ANEMIA-IRON DEFICIENCY 09/29/2006  . Essential hypertension 09/29/2006   Past Medical History:  Diagnosis Date  . Abscess in epidural space of L2-L5 lumbar spine 03/2006  . Anemia, iron deficiency   . Arthritis   . Breast cancer ([place]) 06/12/2017   right breast  . CAD (coronary artery disease), native coronary artery 03/30/2015  . Cataract   . Colon polyp    a. Multiple colonic polyps status post colonoscopy in October 2007, consistent with tubular adenoma, tubulovillous adenoma with no high-grade dysplasia or malignancy identified.   . Constipation   . Coronary artery disease    a. s/p CABG 06/2010: S-D2; S-PDA (at time of MV surgery).  . Depression   . Diabetes mellitus    type II  . Family history of breast cancer   . Family history of prostate cancer   . Genetic testing 09/25/2017   Multi-Cancer panel (83 genes) @ [facility] - No pathogenic mutations detected  . GERD (gastroesophageal reflux disease)   . Hx of transient ischemic attack (TIA)    a. See stroke section.  . Hyperlipidemia   . Hypertension   . Hypoxia    a. Has history of acute hypoxic respiratory failure in the setting  of bronchitis/PNA or prior admissions.  [name] MRSA infection    a. History of recurrent skin infection and soft tissue abscesses, with MRSA positive in the past.  . 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 mitral valve - s/p resection and MV repair 06/2010 Dr. [name].  . 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 Personal history of radiation therapy   . Sleep apnea   . Stenosis of middle cerebral artery    a. Distal R MCA.  . Stroke [facility])    a. 11/2009: mitral mass diagnosed at this time, also has distal R MCA stenosis, tx with coumadin. b. Readmitted 05/2010 with TIA symptoms - had not been taking Coumadin. s/p MV surgery 06/2010. Coumadin stopped 2013 after review of chart by Dr. [name] since mass was removed (stroke felt possibly related  to this).     Family History  Problem Relation Age of Onset  . Colon cancer Mother        dx 52s; deceased 36  . Irritable bowel syndrome Mother   . Prostate cancer Father        deceased 21s  . Cirrhosis Sister        died of GI bleed associated with cirrhosis of the liver  . Stroke Brother   . Prostate cancer Brother 47       deceased 32  . Diabetes Brother        x3  . Kidney disease Brother   . Cancer Brother 94       unk. type  . Breast cancer Cousin        several maternal 1st and 2nd cousins with breast cancer    Past Surgical History:  Procedure Laterality Date  . BREAST LUMPECTOMY Right   . BREAST LUMPECTOMY WITH RADIOACTIVE SEED AND SENTINEL LYMPH NODE BIOPSY Right 07/20/2017   Procedure: RIGHT BREAST LUMPECTOMY WITH RADIOACTIVE SEED AND SENTINEL LYMPH NODE BIOPSY;  Surgeon: Alphonsa Overall, MD;  Location: [place];  Service: General;  Laterality: Right;  . CARDIAC CATHETERIZATION  04/03/2015   Procedure: Left Heart Cath and Cors/Grafts Angiography;  Surgeon: Peter M Martinique, MD;  Location: [place] CV LAB;  Service: Cardiovascular;;  . CORONARY ARTERY BYPASS GRAFT  1/12  . history of ankle fractures requiring surgery    . LUMBAR  LAMINECTOMY/DECOMPRESSION MICRODISCECTOMY Left 04/15/2015   Procedure: LUMBAR LAMINECTOMY/DECOMPRESSION MICRODISCECTOMY 1 LEVEL;  Surgeon: Newman Pies, MD;  Location: [place] NEURO ORS;  Service: Neurosurgery;  Laterality: Left;  Left L23 microdiskectomy  . MV repair and resection of mass  1/12  . TONSILLECTOMY  1971  . TOTAL ABDOMINAL HYSTERECTOMY  1978  . VESICOVAGINAL FISTULA CLOSURE W/ TAH     Social History   Occupational History    Employer: UNEMPLOYED  Tobacco Use  . Smoking status: Former Smoker    Quit date: 10/14/2010    Years since quitting: 8.5  . Smokeless tobacco: Never Used  Substance and Sexual Activity  . Alcohol use: No  . Drug use: No  . Sexual activity: Not Currently

## 2019-05-06 NOTE — Telephone Encounter (Signed)
I called and advised the patient her hydrocodone has been refilled.

## 2019-05-06 NOTE — Telephone Encounter (Signed)
Patient called left voicemail message needing Rx refilled (Hydrocodone) Patient said she is out of her medication   The number to contact patient is [phone]

## 2019-05-06 NOTE — Telephone Encounter (Signed)
Please advise. She saw Dr. Ninfa Linden today and will be having surgery on the shoulder 05/16/19. I did not see that he refilled her medication.

## 2019-05-06 NOTE — Telephone Encounter (Signed)
Sent!

## 2019-05-09 ENCOUNTER — Other Ambulatory Visit: Payer: Self-pay | Admitting: Physician Assistant

## 2019-05-10 ENCOUNTER — Other Ambulatory Visit: Payer: Self-pay

## 2019-05-10 ENCOUNTER — Encounter (HOSPITAL_BASED_OUTPATIENT_CLINIC_OR_DEPARTMENT_OTHER): Payer: Self-pay | Admitting: *Deleted

## 2019-05-10 NOTE — Progress Notes (Signed)
Chart reviewed with Dr Fransisco Beau, [place] for [facility].

## 2019-05-13 ENCOUNTER — Other Ambulatory Visit (HOSPITAL_COMMUNITY)
Admission: RE | Admit: 2019-05-13 | Discharge: 2019-05-13 | Disposition: A | Discharge status: Home / Self Care | Payer: Medicare PPO | Source: Ambulatory Visit | Attending: Orthopaedic Surgery | Admitting: Orthopaedic Surgery

## 2019-05-13 ENCOUNTER — Encounter (HOSPITAL_BASED_OUTPATIENT_CLINIC_OR_DEPARTMENT_OTHER)
Admission: RE | Admit: 2019-05-13 | Discharge: 2019-05-13 | Disposition: A | Discharge status: Home / Self Care | Payer: Medicare PPO | Source: Ambulatory Visit | Attending: Orthopaedic Surgery | Admitting: Orthopaedic Surgery

## 2019-05-13 ENCOUNTER — Other Ambulatory Visit: Payer: Self-pay

## 2019-05-13 DIAGNOSIS — E119 Type 2 diabetes mellitus without complications: Secondary | ICD-10-CM | POA: Diagnosis not present

## 2019-05-13 DIAGNOSIS — Z951 Presence of aortocoronary bypass graft: Secondary | ICD-10-CM | POA: Insufficient documentation

## 2019-05-13 DIAGNOSIS — Z01818 Encounter for other preprocedural examination: Secondary | ICD-10-CM | POA: Diagnosis not present

## 2019-05-13 DIAGNOSIS — I10 Essential (primary) hypertension: Secondary | ICD-10-CM | POA: Insufficient documentation

## 2019-05-13 DIAGNOSIS — I2510 Atherosclerotic heart disease of native coronary artery without angina pectoris: Secondary | ICD-10-CM | POA: Diagnosis not present

## 2019-05-13 DIAGNOSIS — R9431 Abnormal electrocardiogram [ECG] [EKG]: Secondary | ICD-10-CM | POA: Diagnosis not present

## 2019-05-13 DIAGNOSIS — Z20828 Contact with and (suspected) exposure to other viral communicable diseases: Secondary | ICD-10-CM | POA: Insufficient documentation

## 2019-05-13 DIAGNOSIS — Z01812 Encounter for preprocedural laboratory examination: Secondary | ICD-10-CM | POA: Insufficient documentation

## 2019-05-13 LAB — BASIC METABOLIC PANEL
Anion gap: 12 (ref 5–15)
BUN: 16 mg/dL (ref 8–23)
CO2: 25 mmol/L (ref 22–32)
Calcium: 9.4 mg/dL (ref 8.9–10.3)
Chloride: 103 mmol/L (ref 98–111)
Creatinine, Ser: 1.09 mg/dL — ABNORMAL HIGH (ref 0.44–1.00)
GFR calc Af Amer: 60 mL/min — ABNORMAL LOW (ref >60)
GFR calc non Af Amer: 52 mL/min — ABNORMAL LOW (ref >60)
Glucose, Bld: 159 mg/dL — ABNORMAL HIGH (ref 70–99)
Potassium: 5.4 mmol/L — ABNORMAL HIGH (ref 3.5–5.1)
Sodium: 140 mmol/L (ref 135–145)

## 2019-05-13 NOTE — Progress Notes (Signed)

## 2019-05-14 LAB — NOVEL CORONAVIRUS, NAA (HOSP ORDER, SEND-OUT TO REF LAB; TAT 18-24 HRS): SARS-CoV-2, NAA: NOT DETECTED

## 2019-05-14 NOTE — Progress Notes (Signed)
K+ 5.4, notified Dr. Eligha Bridegroom, will proceed with surgery as scheduled.

## 2019-05-15 ENCOUNTER — Other Ambulatory Visit: Payer: Self-pay

## 2019-05-16 ENCOUNTER — Other Ambulatory Visit: Payer: Self-pay

## 2019-05-16 ENCOUNTER — Ambulatory Visit (HOSPITAL_BASED_OUTPATIENT_CLINIC_OR_DEPARTMENT_OTHER): Payer: Medicare PPO | Admitting: Certified Registered"

## 2019-05-16 ENCOUNTER — Encounter (HOSPITAL_BASED_OUTPATIENT_CLINIC_OR_DEPARTMENT_OTHER)
Admission: RE | Disposition: A | Discharge status: Home / Self Care | Payer: Self-pay | Source: Home / Self Care | Attending: Orthopaedic Surgery

## 2019-05-16 ENCOUNTER — Ambulatory Visit (HOSPITAL_BASED_OUTPATIENT_CLINIC_OR_DEPARTMENT_OTHER)
Admission: RE | Admit: 2019-05-16 | Discharge: 2019-05-16 | Disposition: A | Discharge status: Home / Self Care | Payer: Medicare PPO | Attending: Orthopaedic Surgery | Admitting: Orthopaedic Surgery

## 2019-05-16 ENCOUNTER — Encounter (HOSPITAL_BASED_OUTPATIENT_CLINIC_OR_DEPARTMENT_OTHER): Payer: Self-pay | Admitting: *Deleted

## 2019-05-16 DIAGNOSIS — Z8673 Personal history of transient ischemic attack (TIA), and cerebral infarction without residual deficits: Secondary | ICD-10-CM | POA: Insufficient documentation

## 2019-05-16 DIAGNOSIS — E119 Type 2 diabetes mellitus without complications: Secondary | ICD-10-CM | POA: Insufficient documentation

## 2019-05-16 DIAGNOSIS — Z87891 Personal history of nicotine dependence: Secondary | ICD-10-CM | POA: Insufficient documentation

## 2019-05-16 DIAGNOSIS — Z79899 Other long term (current) drug therapy: Secondary | ICD-10-CM | POA: Insufficient documentation

## 2019-05-16 DIAGNOSIS — M1990 Unspecified osteoarthritis, unspecified site: Secondary | ICD-10-CM | POA: Diagnosis not present

## 2019-05-16 DIAGNOSIS — D509 Iron deficiency anemia, unspecified: Secondary | ICD-10-CM | POA: Diagnosis not present

## 2019-05-16 DIAGNOSIS — I10 Essential (primary) hypertension: Secondary | ICD-10-CM | POA: Diagnosis not present

## 2019-05-16 DIAGNOSIS — E669 Obesity, unspecified: Secondary | ICD-10-CM | POA: Insufficient documentation

## 2019-05-16 DIAGNOSIS — K219 Gastro-esophageal reflux disease without esophagitis: Secondary | ICD-10-CM | POA: Diagnosis not present

## 2019-05-16 DIAGNOSIS — Z6839 Body mass index (BMI) 39.0-39.9, adult: Secondary | ICD-10-CM | POA: Insufficient documentation

## 2019-05-16 DIAGNOSIS — S46012D Strain of muscle(s) and tendon(s) of the rotator cuff of left shoulder, subsequent encounter: Secondary | ICD-10-CM | POA: Diagnosis not present

## 2019-05-16 DIAGNOSIS — Z7984 Long term (current) use of oral hypoglycemic drugs: Secondary | ICD-10-CM | POA: Insufficient documentation

## 2019-05-16 DIAGNOSIS — J449 Chronic obstructive pulmonary disease, unspecified: Secondary | ICD-10-CM | POA: Insufficient documentation

## 2019-05-16 DIAGNOSIS — E785 Hyperlipidemia, unspecified: Secondary | ICD-10-CM | POA: Diagnosis not present

## 2019-05-16 DIAGNOSIS — Z9114 Patient's other noncompliance with medication regimen: Secondary | ICD-10-CM | POA: Diagnosis not present

## 2019-05-16 DIAGNOSIS — Z791 Long term (current) use of non-steroidal anti-inflammatories (NSAID): Secondary | ICD-10-CM | POA: Diagnosis not present

## 2019-05-16 DIAGNOSIS — I129 Hypertensive chronic kidney disease with stage 1 through stage 4 chronic kidney disease, or unspecified chronic kidney disease: Secondary | ICD-10-CM | POA: Diagnosis not present

## 2019-05-16 DIAGNOSIS — Z7982 Long term (current) use of aspirin: Secondary | ICD-10-CM | POA: Diagnosis not present

## 2019-05-16 DIAGNOSIS — M75122 Complete rotator cuff tear or rupture of left shoulder, not specified as traumatic: Secondary | ICD-10-CM

## 2019-05-16 DIAGNOSIS — N189 Chronic kidney disease, unspecified: Secondary | ICD-10-CM | POA: Diagnosis not present

## 2019-05-16 DIAGNOSIS — G8918 Other acute postprocedural pain: Secondary | ICD-10-CM | POA: Diagnosis not present

## 2019-05-16 DIAGNOSIS — I2510 Atherosclerotic heart disease of native coronary artery without angina pectoris: Secondary | ICD-10-CM | POA: Diagnosis not present

## 2019-05-16 DIAGNOSIS — F329 Major depressive disorder, single episode, unspecified: Secondary | ICD-10-CM | POA: Diagnosis not present

## 2019-05-16 DIAGNOSIS — Z951 Presence of aortocoronary bypass graft: Secondary | ICD-10-CM | POA: Insufficient documentation

## 2019-05-16 DIAGNOSIS — Z853 Personal history of malignant neoplasm of breast: Secondary | ICD-10-CM | POA: Diagnosis not present

## 2019-05-16 HISTORY — PX: RESECTION DISTAL CLAVICAL: SHX5053

## 2019-05-16 HISTORY — PX: SHOULDER ARTHROSCOPY WITH ROTATOR CUFF REPAIR AND SUBACROMIAL DECOMPRESSION: SHX5686

## 2019-05-16 LAB — GLUCOSE, CAPILLARY
Glucose-Capillary: 120 mg/dL — ABNORMAL HIGH (ref 70–99)
Glucose-Capillary: 101 mg/dL — ABNORMAL HIGH (ref 70–99)

## 2019-05-16 SURGERY — SHOULDER ARTHROSCOPY WITH ROTATOR CUFF REPAIR AND SUBACROMIAL DECOMPRESSION
Anesthesia: Regional | Site: Shoulder | Laterality: Left

## 2019-05-16 MED ORDER — PROPOFOL 500 MG/50ML IV EMUL
INTRAVENOUS | Status: AC
  Filled 2019-05-16: qty 50

## 2019-05-16 MED ORDER — ONDANSETRON HCL 4 MG/2ML IJ SOLN
INTRAMUSCULAR | Status: DC | PRN
  Administered 2019-05-16: 4 mg via INTRAVENOUS

## 2019-05-16 MED ORDER — MIDAZOLAM HCL 2 MG/2ML IJ SOLN
INTRAMUSCULAR | Status: AC
  Filled 2019-05-16: qty 2

## 2019-05-16 MED ORDER — ACETAMINOPHEN 500 MG PO TABS
ORAL_TABLET | ORAL | Status: AC
  Filled 2019-05-16: qty 2

## 2019-05-16 MED ORDER — MIDAZOLAM HCL 2 MG/2ML IJ SOLN
1.0000 mg | INTRAMUSCULAR | Status: DC | PRN
  Administered 2019-05-16: 1 mg via INTRAVENOUS

## 2019-05-16 MED ORDER — DEXAMETHASONE SODIUM PHOSPHATE 10 MG/ML IJ SOLN
INTRAMUSCULAR | Status: AC
  Filled 2019-05-16: qty 1

## 2019-05-16 MED ORDER — OXYCODONE HCL 5 MG/5ML PO SOLN: 5.0000 mg | Freq: Once | ORAL | Status: DC | PRN

## 2019-05-16 MED ORDER — SODIUM CHLORIDE 0.9 % IV SOLN
INTRAVENOUS | Status: DC | PRN
  Administered 2019-05-16: 50 ug/min via INTRAVENOUS

## 2019-05-16 MED ORDER — LIDOCAINE 2% (20 MG/ML) 5 ML SYRINGE
INTRAMUSCULAR | Status: AC
  Filled 2019-05-16: qty 5

## 2019-05-16 MED ORDER — ACETAMINOPHEN 500 MG PO TABS
1000.0000 mg | ORAL_TABLET | Freq: Once | ORAL | Status: AC
  Administered 2019-05-16: 07:00:00 1000 mg via ORAL

## 2019-05-16 MED ORDER — PROMETHAZINE HCL 25 MG/ML IJ SOLN: 6.2500 mg | INTRAMUSCULAR | Status: DC | PRN

## 2019-05-16 MED ORDER — DEXAMETHASONE SODIUM PHOSPHATE 4 MG/ML IJ SOLN
INTRAMUSCULAR | Status: DC | PRN
  Administered 2019-05-16: 4 mg via INTRAVENOUS

## 2019-05-16 MED ORDER — EPHEDRINE SULFATE 50 MG/ML IJ SOLN
INTRAMUSCULAR | Status: DC | PRN
  Administered 2019-05-16: 10 mg via INTRAVENOUS
  Administered 2019-05-16: 5 mg via INTRAVENOUS
  Administered 2019-05-16: 10 mg via INTRAVENOUS
  Administered 2019-05-16: 15 mg via INTRAVENOUS

## 2019-05-16 MED ORDER — ROPIVACAINE HCL 5 MG/ML IJ SOLN
INTRAMUSCULAR | Status: DC | PRN
  Administered 2019-05-16: 20 mL via PERINEURAL

## 2019-05-16 MED ORDER — SUGAMMADEX SODIUM 200 MG/2ML IV SOLN
INTRAVENOUS | Status: DC | PRN
  Administered 2019-05-16: 200 mg via INTRAVENOUS

## 2019-05-16 MED ORDER — LIDOCAINE HCL (CARDIAC) PF 100 MG/5ML IV SOSY
PREFILLED_SYRINGE | INTRAVENOUS | Status: DC | PRN
  Administered 2019-05-16: 40 mg via INTRAVENOUS

## 2019-05-16 MED ORDER — OXYCODONE-ACETAMINOPHEN 5-325 MG PO TABS: 1.0000 | ORAL_TABLET | ORAL | 0 refills | Status: DC | PRN

## 2019-05-16 MED ORDER — CEFAZOLIN SODIUM-DEXTROSE 2-4 GM/100ML-% IV SOLN
INTRAVENOUS | Status: AC
  Filled 2019-05-16: qty 100

## 2019-05-16 MED ORDER — BUPIVACAINE LIPOSOME 1.3 % IJ SUSP
INTRAMUSCULAR | Status: DC | PRN
  Administered 2019-05-16: 10 mL via PERINEURAL

## 2019-05-16 MED ORDER — LACTATED RINGERS IV SOLN
INTRAVENOUS | Status: DC
  Administered 2019-05-16: 07:00:00 via INTRAVENOUS

## 2019-05-16 MED ORDER — FENTANYL CITRATE (PF) 100 MCG/2ML IJ SOLN
INTRAMUSCULAR | Status: DC | PRN
  Administered 2019-05-16: 100 ug via INTRAVENOUS

## 2019-05-16 MED ORDER — CEFAZOLIN SODIUM-DEXTROSE 2-4 GM/100ML-% IV SOLN
2.0000 g | INTRAVENOUS | Status: AC
  Administered 2019-05-16: 08:00:00 2 g via INTRAVENOUS

## 2019-05-16 MED ORDER — PHENYLEPHRINE HCL (PRESSORS) 10 MG/ML IV SOLN
INTRAVENOUS | Status: DC | PRN
  Administered 2019-05-16: 80 ug via INTRAVENOUS

## 2019-05-16 MED ORDER — FENTANYL CITRATE (PF) 100 MCG/2ML IJ SOLN
50.0000 ug | INTRAMUSCULAR | Status: DC | PRN
  Administered 2019-05-16: 50 ug via INTRAVENOUS

## 2019-05-16 MED ORDER — CHLORHEXIDINE GLUCONATE 4 % EX LIQD: 60.0000 mL | Freq: Once | CUTANEOUS | Status: DC

## 2019-05-16 MED ORDER — SODIUM CHLORIDE 0.9 % IR SOLN
Status: DC | PRN
  Administered 2019-05-16: 9000 mL

## 2019-05-16 MED ORDER — ROCURONIUM BROMIDE 100 MG/10ML IV SOLN
INTRAVENOUS | Status: DC | PRN
  Administered 2019-05-16: 60 mg via INTRAVENOUS

## 2019-05-16 MED ORDER — FENTANYL CITRATE (PF) 100 MCG/2ML IJ SOLN
INTRAMUSCULAR | Status: AC
  Filled 2019-05-16: qty 2

## 2019-05-16 MED ORDER — MEPERIDINE HCL 25 MG/ML IJ SOLN: 6.2500 mg | INTRAMUSCULAR | Status: DC | PRN

## 2019-05-16 MED ORDER — ONDANSETRON HCL 4 MG/2ML IJ SOLN
INTRAMUSCULAR | Status: AC
  Filled 2019-05-16: qty 2

## 2019-05-16 MED ORDER — PROPOFOL 10 MG/ML IV BOLUS
INTRAVENOUS | Status: DC | PRN
  Administered 2019-05-16: 150 mg via INTRAVENOUS

## 2019-05-16 MED ORDER — HYDROMORPHONE HCL 1 MG/ML IJ SOLN: 0.2500 mg | INTRAMUSCULAR | Status: DC | PRN

## 2019-05-16 MED ORDER — OXYCODONE HCL 5 MG PO TABS: 5.0000 mg | ORAL_TABLET | Freq: Once | ORAL | Status: DC | PRN

## 2019-05-16 SURGICAL SUPPLY — 68 items
AID PSTN UNV HD RSTRNT DISP (MISCELLANEOUS) ×1
BLADE SURG 15 STRL LF DISP TIS (BLADE) IMPLANT
BLADE SURG 15 STRL SS (BLADE)
BURR OVAL 8 FLU 4.0MM X 13CM (MISCELLANEOUS) ×1
BURR OVAL 8 FLU 4.0X13 (MISCELLANEOUS) ×1 IMPLANT
BURR OVAL 8 FLU 5.0MM X 13CM (MISCELLANEOUS)
BURR OVAL 8 FLU 5.0X13 (MISCELLANEOUS) ×1 IMPLANT
CANNULA TWIST IN 8.25X7CM (CANNULA) ×2 IMPLANT
COVER WAND RF STERILE (DRAPES) IMPLANT
DECANTER SPIKE VIAL GLASS SM (MISCELLANEOUS) IMPLANT
DISSECTOR  3.8MM X 13CM (MISCELLANEOUS) ×2
DISSECTOR 3.8MM X 13CM (MISCELLANEOUS) ×1 IMPLANT
DISSECTOR 4.0MM X 13CM (MISCELLANEOUS) ×3 IMPLANT
DRAPE IMP U-DRAPE 54X76 (DRAPES) ×3 IMPLANT
DRAPE SHOULDER BEACH CHAIR (DRAPES) ×3 IMPLANT
DRAPE U-SHAPE 47X51 STRL (DRAPES) ×3 IMPLANT
DRSG PAD ABDOMINAL 8X10 ST (GAUZE/BANDAGES/DRESSINGS) ×3 IMPLANT
DURAPREP 26ML APPLICATOR (WOUND CARE) ×3 IMPLANT
ELECT REM PT RETURN 9FT ADLT (ELECTROSURGICAL)
ELECTRODE REM PT RTRN 9FT ADLT (ELECTROSURGICAL) IMPLANT
GAUZE SPONGE 4X4 12PLY STRL (GAUZE/BANDAGES/DRESSINGS) ×3 IMPLANT
GAUZE XEROFORM 1X8 LF (GAUZE/BANDAGES/DRESSINGS) ×3 IMPLANT
GLOVE BIO SURGEON STRL SZ 6.5 (GLOVE) ×1 IMPLANT
GLOVE BIO SURGEON STRL SZ7.5 (GLOVE) ×3 IMPLANT
GLOVE BIO SURGEONS STRL SZ 6.5 (GLOVE) ×1
GLOVE BIOGEL PI IND STRL 7.0 (GLOVE) IMPLANT
GLOVE BIOGEL PI IND STRL 8 (GLOVE) ×2 IMPLANT
GLOVE BIOGEL PI INDICATOR 7.0 (GLOVE) ×2
GLOVE BIOGEL PI INDICATOR 8 (GLOVE) ×4
GLOVE ECLIPSE 6.5 STRL STRAW (GLOVE) ×2 IMPLANT
GLOVE SURG ORTHO 8.0 STRL STRW (GLOVE) ×3 IMPLANT
GOWN STRL REUS W/ TWL LRG LVL3 (GOWN DISPOSABLE) ×1 IMPLANT
GOWN STRL REUS W/ TWL XL LVL3 (GOWN DISPOSABLE) ×1 IMPLANT
GOWN STRL REUS W/TWL LRG LVL3 (GOWN DISPOSABLE) ×9
GOWN STRL REUS W/TWL XL LVL3 (GOWN DISPOSABLE) ×3
IMPL SPEEDBRIDGE KIT (Orthopedic Implant) IMPLANT
IMPLANT SPEEDBRIDGE KIT (Orthopedic Implant) ×3 IMPLANT
KIT SHOULDER TRACTION (DRAPES) ×3 IMPLANT
MANIFOLD NEPTUNE II (INSTRUMENTS) ×3 IMPLANT
NDL SAFETY ECLIPSE 18X1.5 (NEEDLE) IMPLANT
NEEDLE HYPO 18GX1.5 SHARP (NEEDLE)
NEEDLE SCORPION MULTI FIRE (NEEDLE) IMPLANT
NS IRRIG 1000ML POUR BTL (IV SOLUTION) IMPLANT
PACK ARTHROSCOPY DSU (CUSTOM PROCEDURE TRAY) ×3 IMPLANT
PACK BASIN DAY SURGERY FS (CUSTOM PROCEDURE TRAY) ×3 IMPLANT
PAD ORTHO SHOULDER 7X19 LRG (SOFTGOODS) ×2 IMPLANT
PENCIL SMOKE EVACUATOR (MISCELLANEOUS) IMPLANT
PORT APPOLLO RF 90DEGREE MULTI (SURGICAL WAND) ×3 IMPLANT
RESTRAINT HEAD UNIVERSAL NS (MISCELLANEOUS) ×3 IMPLANT
SLING ARM FOAM STRAP LRG (SOFTGOODS) IMPLANT
SLING ULTRA II MEDIUM (SOFTGOODS) IMPLANT
SPONGE LAP 4X18 RFD (DISPOSABLE) IMPLANT
SUCTION FRAZIER HANDLE 10FR (MISCELLANEOUS)
SUCTION TUBE FRAZIER 10FR DISP (MISCELLANEOUS) IMPLANT
SUT ETHIBOND 2 OS 4 DA (SUTURE) IMPLANT
SUT ETHILON 3 0 PS 1 (SUTURE) ×3 IMPLANT
SUT VIC AB 2-0 SH 27 (SUTURE)
SUT VIC AB 2-0 SH 27XBRD (SUTURE) IMPLANT
SYR 20ML LL LF (SYRINGE) IMPLANT
SYR BULB 3OZ (MISCELLANEOUS) IMPLANT
TAPE HYPAFIX 6 X30' (GAUZE/BANDAGES/DRESSINGS) ×1
TAPE HYPAFIX 6X30 (GAUZE/BANDAGES/DRESSINGS) ×2 IMPLANT
TOWEL GREEN STERILE FF (TOWEL DISPOSABLE) ×3 IMPLANT
TUBE CONNECTING 20'X1/4 (TUBING) ×1
TUBE CONNECTING 20X1/4 (TUBING) ×2 IMPLANT
TUBING ARTHROSCOPY IRRIG 16FT (MISCELLANEOUS) ×3 IMPLANT
WATER STERILE IRR 1000ML POUR (IV SOLUTION) ×3 IMPLANT
YANKAUER SUCT BULB TIP NO VENT (SUCTIONS) IMPLANT

## 2019-05-16 NOTE — Anesthesia Preprocedure Evaluation (Signed)
Anesthesia Evaluation  Patient identified by MRN, date of birth, ID band Patient awake    Reviewed: Allergy & Precautions, NPO status , Patient's Chart, lab work & pertinent test results, reviewed documented beta blocker date and time   Airway Mallampati: III  TM Distance: >3 FB Neck ROM: Full    Dental  (+) Teeth Intact, Dental Advisory Given, Chipped,    Pulmonary sleep apnea , pneumonia, resolved, COPD (controlled, mild),  COPD inhaler, former smoker,  Hx PNA with hypoxic respiratory failure in the past  No CPAP  Quit smoking 2012   breath sounds clear to auscultation       Cardiovascular hypertension, Pt. on medications and Pt. on home beta blockers + CAD, + CABG and + Peripheral Vascular Disease  + Valvular Problems/Murmurs (s/p mitral valve repair)  Rhythm:Regular Rate:Normal  Hx CABG x 2/MV repair 2012  Cardiologist is Dr. Fransico Him. Last visit 06/30/17 with Lyda Jester, PA-C for preoperative evaluation.   Abnormal stress test 2016--> sent for Cath 2016--> medical treatment 1. Severe 2 vessel obstructive CAD. The ramus branch is tiny.  2. Patent SVG to PDA 3. Patent SVG to second diagonal 4. Normal LV function 5. No MR    Neuro/Psych PSYCHIATRIC DISORDERS Depression Middle cerebral artery stenosis   CVA a/w noncompliance with coumadin TIACVA, No Residual Symptoms    GI/Hepatic GERD  Medicated and Controlled,(+)     substance abuse  alcohol use, Colon polyp   Endo/Other  diabetes, Poorly Controlled, Type 2, Oral Hypoglycemic AgentsLast A1c 7.3  Obesity BMI 39  Renal/GU CRFRenal diseaseCr 1.09  negative genitourinary   Musculoskeletal  (+) Arthritis , Osteoarthritis,  Left rotator cuff tear   Abdominal (+) + obese,   Peds  Hematology  (+) anemia , HLD Fe def anemia   Anesthesia Other Findings Hx recurrent skin infections, abscesses MRSA positive in past  Reproductive/Obstetrics Hx breast  ca                            Lab Results  Component Value Date   WBC 5.9 04/19/2019   HGB 11.6 04/19/2019   HCT 36.3 04/19/2019   MCV 82 04/19/2019   PLT 279 04/19/2019   Lab Results  Component Value Date   CREATININE 1.09 (H) 05/13/2019   BUN 16 05/13/2019   NA 140 05/13/2019   K 5.4 (H) 05/13/2019   CL 103 05/13/2019   CO2 25 05/13/2019    Anesthesia Physical  Anesthesia Plan  ASA: III  Anesthesia Plan: General and Regional   Post-op Pain Management: GA combined w/ Regional for post-op pain   Induction: Intravenous  PONV Risk Score and Plan: 3 and Midazolam, Dexamethasone, Ondansetron and Treatment may vary due to age or medical condition  Airway Management Planned: Oral ETT  Additional Equipment: None  Intra-op Plan:   Post-operative Plan: Extubation in OR  Informed Consent: I have reviewed the patients History and Physical, chart, labs and discussed the procedure including the risks, benefits and alternatives for the proposed anesthesia with the patient or authorized representative who has indicated his/her understanding and acceptance.     Dental advisory given  Plan Discussed with: CRNA  Anesthesia Plan Comments:        Anesthesia Quick Evaluation

## 2019-05-16 NOTE — H&P (Signed)
[name] is an 68 y.o. female.   Chief Complaint:   Left shoulder pain and weakness HPI: The patient is a 68 year old female who sustained a mechanical fall injuring her left shoulder.  A MRI was obtained and it did show a full-thickness tear of the supraspinatus tendon of the rotator cuff with retraction.  There is slight muscle atrophy as well.  Given her pain and weakness she does wish to proceed with an arthroscopic intervention which is warranted based on her MRI findings and clinical exam findings.  Past Medical History:  Diagnosis Date  . Abscess in epidural space of L2-L5 lumbar spine 03/2006  . Anemia, iron deficiency   . Arthritis   . Breast cancer ([place]) 06/12/2017   right breast  . CAD (coronary artery disease), native coronary artery 03/30/2015  . Cataract   . Colon polyp    a. Multiple colonic polyps status post colonoscopy in October 2007, consistent with tubular adenoma, tubulovillous adenoma with no high-grade dysplasia or malignancy identified.   . Constipation   . Coronary artery disease    a. s/p CABG 06/2010: S-D2; S-PDA (at time of MV surgery).  . Depression   . Diabetes mellitus    type II  . Family history of breast cancer   . Family history of prostate cancer   . Genetic testing 09/25/2017   Multi-Cancer panel (83 genes) @ [facility] - No pathogenic mutations detected  . GERD (gastroesophageal reflux disease)   . Hx of transient ischemic attack (TIA)    a. See stroke section.  . Hyperlipidemia   . Hypertension   . Hypoxia    a. Has history of acute hypoxic respiratory failure in the setting of bronchitis/PNA or prior admissions.  [name] MRSA infection    a. History of recurrent skin infection and soft tissue abscesses, with MRSA positive in the past.  . 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 mitral valve - s/p resection and MV repair 06/2010 Dr. [name].  . 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 Personal history of radiation therapy   .  Sleep apnea    does not use CPAP  . Stenosis of middle cerebral artery    a. Distal R MCA.  . Stroke [facility])    a. 11/2009: mitral mass diagnosed at this time, also has distal R MCA stenosis, tx with coumadin. b. Readmitted 05/2010 with TIA symptoms - had not been taking Coumadin. s/p MV surgery 06/2010. Coumadin stopped 2013 after review of chart by Dr. [name] since mass was removed (stroke felt possibly related to this).     Past Surgical History:  Procedure Laterality Date  . BREAST LUMPECTOMY Right   . BREAST LUMPECTOMY WITH RADIOACTIVE SEED AND SENTINEL LYMPH NODE BIOPSY Right 07/20/2017   Procedure: RIGHT BREAST LUMPECTOMY WITH RADIOACTIVE SEED AND SENTINEL LYMPH NODE BIOPSY;  Surgeon: Alphonsa Overall, MD;  Location: [place];  Service: General;  Laterality: Right;  . CARDIAC CATHETERIZATION  04/03/2015   Procedure: Left Heart Cath and Cors/Grafts Angiography;  Surgeon: Peter M Martinique, MD;  Location: [place] CV LAB;  Service: Cardiovascular;;  . CORONARY ARTERY BYPASS GRAFT  1/12  . history of ankle fractures requiring surgery    . LUMBAR LAMINECTOMY/DECOMPRESSION MICRODISCECTOMY Left 04/15/2015   Procedure: LUMBAR LAMINECTOMY/DECOMPRESSION MICRODISCECTOMY 1 LEVEL;  Surgeon: Newman Pies, MD;  Location: [place] NEURO ORS;  Service: Neurosurgery;  Laterality: Left;  Left L23 microdiskectomy  . MV repair and resection of mass  1/12  . TONSILLECTOMY  1971  .  TOTAL ABDOMINAL HYSTERECTOMY  1978  . VESICOVAGINAL FISTULA CLOSURE W/ TAH      Family History  Problem Relation Age of Onset  . Colon cancer Mother        dx 24s; deceased 25  . Irritable bowel syndrome Mother   . Prostate cancer Father        deceased 41s  . Cirrhosis Sister        died of GI bleed associated with cirrhosis of the liver  . Stroke Brother   . Prostate cancer Brother 97       deceased 66  . Diabetes Brother        x3  . Kidney disease Brother   . Cancer Brother 22       unk. type  . Breast cancer Cousin         several maternal 1st and 2nd cousins with breast cancer   Social History:  reports that she quit smoking about 8 years ago. She has never used smokeless tobacco. She reports current alcohol use. She reports that she does not use drugs.  Allergies: No Known Allergies  Medications Prior to Admission  Medication Sig Dispense Refill  . ACCU-CHEK SOFTCLIX LANCETS lancets Use as instructed 100 each 12  . acetaminophen (ACETAMINOPHEN EXTRA STRENGTH) 500 MG tablet Take 1 tablet (500 mg total) by mouth every 8 (eight) hours as needed. 100 tablet 0  . albuterol (PROVENTIL HFA;VENTOLIN HFA) 108 (90 Base) MCG/ACT inhaler Inhale 2 puffs into the lungs every 6 (six) hours as needed for wheezing or shortness of breath. 1 Inhaler 2  . amLODipine (NORVASC) 10 MG tablet Take 1 tablet (10 mg total) by mouth daily. 90 tablet 2  . aspirin EC 81 MG tablet Take 1 tablet (81 mg total) by mouth daily. 30 tablet 5  . atorvastatin (LIPITOR) 40 MG tablet Take 1.5 tablets (60 mg total) by mouth daily. 135 tablet 2  . baclofen (LIORESAL) 10 MG tablet Take 0.5-1 tablets (5-10 mg total) by mouth 3 (three) times daily as needed for muscle spasms. 60 each 3  . Blood Glucose Monitoring Suppl (ACCU-CHEK AVIVA PLUS) w/Device KIT Use as directed 1 kit 0  . buPROPion (WELLBUTRIN XL) 150 MG 24 hr tablet Take 1 tablet (150 mg total) by mouth 2 (two) times daily. 180 tablet 2  . carvedilol (COREG) 6.25 MG tablet Take 1 tablet (6.25 mg total) by mouth 2 (two) times daily with a meal. 180 tablet 3  . diclofenac sodium (VOLTAREN) 1 % GEL Apply 2 g topically 4 (four) times daily as needed. 100 g 1  . FEROSUL 325 (65 Fe) MG tablet TAKE 1 TABLET (325 MG TOTAL) BY MOUTH DAILY WITH BREAKFAST. 90 tablet 0  . glipiZIDE (GLUCOTROL) 5 MG tablet TAKE 1 TABLET DAILY BEFORE BREAKFAST 90 tablet 1  . glucose blood (ACCU-CHEK AVIVA PLUS) test strip Use as instructed 100 each 12  . hydrochlorothiazide (HYDRODIURIL) 25 MG tablet TAKE 1 TABLET (25 MG  TOTAL) BY MOUTH DAILY. 90 tablet 0  . HYDROcodone-acetaminophen (NORCO/VICODIN) 5-325 MG tablet Take 1 tablet by mouth 2 (two) times daily as needed for moderate pain. 20 tablet 0  . letrozole (FEMARA) 2.5 MG tablet Take 1 tablet (2.5 mg total) by mouth daily. 90 tablet 3  . linagliptin (TRADJENTA) 5 MG TABS tablet Take 1 tablet (5 mg total) by mouth daily. 90 tablet 1  . losartan (COZAAR) 100 MG tablet TAKE 1 TABLET (100 MG TOTAL) BY MOUTH DAILY. 90 tablet 0  .  metFORMIN (GLUCOPHAGE) 1000 MG tablet TAKE 1 TABLET TWICE DAILY WITH MEALS 180 tablet 0  . methocarbamol (ROBAXIN) 500 MG tablet Take 1 tablet (500 mg total) by mouth every 8 (eight) hours as needed for muscle spasms. /back pain 270 tablet 0  . pantoprazole (PROTONIX) 40 MG tablet Take 1 tablet (40 mg total) by mouth daily. 90 tablet 3  . polyethylene glycol (MIRALAX / GLYCOLAX) 17 g packet Take 17 g by mouth daily as needed for mild constipation or moderate constipation. 30 each 5  . traZODone (DESYREL) 50 MG tablet Take 1 tablet (50 mg total) by mouth at bedtime as needed. for sleep 90 tablet 0    Results for orders placed or performed during the hospital encounter of 05/16/19 (from the past 48 hour(s))  Glucose, capillary     Status: Abnormal   Collection Time: 05/16/19  6:55 AM  Result Value Ref Range   Glucose-Capillary 120 (H) 70 - 99 mg/dL   No results found.  ROS  Blood pressure (!) 148/76, pulse 67, temperature 98.4 F (36.9 C), temperature source Oral, resp. rate 18, height '5\' 7"'$  (1.702 m), weight 112 kg, SpO2 99 %. Physical Exam  Constitutional: She is oriented to person, place, and time. She appears well-developed and well-nourished.  HENT:  Head: Normocephalic and atraumatic.  Eyes: Pupils are equal, round, and reactive to light. EOM are normal.  Neck: Normal range of motion. Neck supple.  Cardiovascular: Normal rate.  Respiratory: Effort normal.  GI: Soft.  Musculoskeletal:     Left shoulder: She exhibits  decreased range of motion, tenderness and decreased strength.  Neurological: She is alert and oriented to person, place, and time.  Skin: Skin is warm and dry.  Psychiatric: She has a normal mood and affect.     Assessment/Plan Left shoulder full-thickness rotator cuff tear  The plan will be to proceed to the operating room today for surgery on her left shoulder.  This will be an arthroscopic intervention.  We will attempt to repair the rotator cuff but she understands this depends on the deconditioning of the rotator cuff and her collagen fibers intraoperatively.  We had a long and thorough discussion in my office about the surgery and what it involves in the anatomy of the shoulder.  All questions concerns were answered and addressed.  The risk and benefits of surgery were discussed.  Informed consent is obtained.  Mcarthur Rossetti, MD 05/16/2019, 7:01 AM

## 2019-05-16 NOTE — Brief Op Note (Signed)
05/16/2019  8:53 AM  PATIENT:  [name]  68 y.o. female  PRE-OPERATIVE DIAGNOSIS:  left shoulder full-thickness rotator cuff tear  POST-OPERATIVE DIAGNOSIS:  left shoulder full-thickness rotator cuff tear  PROCEDURE:  Procedure(s): SHOULDER ARTHROSCOPY WITH ROTATOR CUFF REPAIR AND SUBACROMIAL DECOMPRESSION (Left) RESECTION DISTAL CLAVICAL (Left)  SURGEON:  Surgeon(s) and Role:    Mcarthur Rossetti, MD - Primary  PHYSICIAN ASSISTANT: Benita Stabile, PA-C  ANESTHESIA:   regional and general  COUNTS:  YES  DICTATION: .Other Dictation: Dictation Number [id number]  PLAN OF CARE: Discharge to home after PACU  PATIENT DISPOSITION:  PACU - hemodynamically stable.   Delay start of Pharmacological VTE agent (>24hrs) due to surgical blood loss or risk of bleeding: no

## 2019-05-16 NOTE — Anesthesia Procedure Notes (Signed)
Anesthesia Regional Block: Interscalene brachial plexus block   Pre-Anesthetic Checklist: ,, timeout performed, Correct Patient, Correct Site, Correct Laterality, Correct Procedure, Correct Position, site marked, Risks and benefits discussed,  Surgical consent,  Pre-op evaluation,  At surgeon's request and post-op pain management  Laterality: Left  Prep: Maximum Sterile Barrier Precautions used, chloraprep       Needles:  Injection technique: Single-shot  Needle Type: Echogenic Stimulator Needle     Needle Length: 9cm  Needle Gauge: 22     Additional Needles:   Procedures:,,,, ultrasound used (permanent image in chart),,,,  Narrative:  Start time: 05/16/2019 7:00 AM End time: 05/16/2019 8:05 AM Injection made incrementally with aspirations every 5 mL.  Performed by: Personally  Anesthesiologist: Pervis Hocking, DO  Additional Notes: Monitors applied. No increased pain on injection. No increased resistance to injection. Injection made in 5cc increments. Good needle visualization. Patient tolerated procedure well.

## 2019-05-16 NOTE — Transfer of Care (Signed)
Immediate Anesthesia Transfer of Care Note  Patient: [name]  Procedure(s) Performed: SHOULDER ARTHROSCOPY WITH ROTATOR CUFF REPAIR AND SUBACROMIAL DECOMPRESSION (Left Shoulder) RESECTION DISTAL CLAVICAL (Left Shoulder)  Patient Location: PACU  Anesthesia Type:GA combined with regional for post-op pain  Level of Consciousness: sedated  Airway & Oxygen Therapy: Patient Spontanous Breathing and Patient connected to nasal cannula oxygen  Post-op Assessment: Report given to RN and Post -op Vital signs reviewed and stable  Post vital signs: Reviewed and stable  Last Vitals:  Vitals Value Taken Time  BP 137/65 05/16/19 0907  Temp    Pulse 66 05/16/19 0909  Resp 17 05/16/19 0909  SpO2 100 % 05/16/19 0909  Vitals shown include unvalidated device data.  Last Pain:  Vitals:   05/16/19 0637  TempSrc: Oral  PainSc: 7       Patients Stated Pain Goal: 3 (35/67/01 4103)  Complications: No apparent anesthesia complications

## 2019-05-16 NOTE — Op Note (Signed)
NAME: [name], [name] MEDICAL RECORD TK:[id number] ACCOUNT [id number] DATE OF BIRTH:1951-03-25 FACILITY: [facility] LOCATION: [facility] PHYSICIAN:Giovonnie Trettel Kerry Fort, MD  OPERATIVE REPORT  DATE OF PROCEDURE:  05/16/2019  PREOPERATIVE DIAGNOSIS:  Left shoulder full thickness rotator cuff tear.  POSTOPERATIVE DIAGNOSIS:  Left shoulder full thickness rotator cuff tear.  PROCEDURE:  Left shoulder arthroscopy with debridement and arthroscopically assisted rotator cuff repair.  FINDINGS:  Full thickness and retracted left shoulder rotator cuff with a supraspinatus tendon.  SURGEON:  Lind Guest. Ninfa Linden, MD  ASSISTANT:  Erskine Emery, PA-C.  ANESTHESIA: 1.  Left upper extremity regional block. 2.  General.  ESTIMATED BLOOD LOSS:  Minimal.  COMPLICATIONS:  None.  ANTIBIOTICS:  Two grams IV Ancef.  INDICATIONS:  The patient is a 68 year old diabetic, obese female who sustained a mechanical fall about a month ago.  I believe this was in a restaurant.  She had severe shoulder pain and weakness after that.  She was seen by family medicine sports  medicine physician.  An MRI was obtained and it confirmed a full thickness and retracted rotator cuff tear.  There was minimal muscle atrophy and edema, so this is definitely suggested an acute tear.  She does describe normal shoulder function prior to  the fall.  On exam, she definitely has weakness in the rotator cuff and I did review the MRI.  We talked about the possibility of rotator cuff repair based on these findings.  She understands at age 68 with being a diabetic that the collagen fibers may  not allow for repair.  Also, her bone quality may not allow for repair.  We felt that at least attempt at a repair is reasonable as well as just a debridement in general given the inflamed tissue and pain she is having.  After a long and thorough  discussion about the risks and benefits of surgery as well as our goals, she did wish to  proceed.  DESCRIPTION OF PROCEDURE:  After informed consent was obtained and appropriate left shoulder was marked, anesthesia obtained a regional block.  She was then brought to the operating room and placed on the operating table.  General anesthesia was then  obtained.  She was then fashioned into beach chair position.  Sequential compression devices were already placed on her legs.  There was padding at the waist, knees, head and neck.  There was appropriate position of the head and neck as well and padding  of the down nonoperative right arm.  Her left shoulder roll was placed and in-line skeletal traction using a fishing pole traction device and 10 pounds of traction with 45 degrees of forward flexion and neutral rotation.  Left shoulder was prepped and  draped in sterile with DuraPrep and sterile drapes.  A time-out was called.  She was identified, correct patient, correct left shoulder.  I then made a posterolateral arthroscopy portal and entered the glenohumeral joint.  There was definitely inflamed  tissue at the undersurface of the rotator cuff and you could see there was a full thickness tear.  The anterior labrum was just slightly degenerative.  I made an anterior portal through the rotator interval and carried out a minimal debridement in the  glenohumeral joint area.  I then entered the subacromial space through the posterior portal and made at least 2 separate lateral portals.  I was able to perform a bursectomy in that area and performed a partial acromioplasty using a high speed bur.  We  were able to mobilize the  rotator cuff and using a cuff grasper, brought it right back to the footprint.  I tugged on this several times and it felt like the fibers had significant integrity to them.  With that being said, we decided to proceed with a  double row repair of the cuff.  Using the Scorpion suture passer, we were able to place anterior and posterior sutures through the rotator cuff with  FiberTape sutures.  We placed an anterior and posterior anchor prior to passing these through.  We then  did our double row anchors to criss-cross the rotator cuff sutures and bury those into the bone and bring a nice taut repair.  We were able to fully cover the humeral head with this repair.  I put the shoulder through internal and external rotation and  it moved as a unit.  I then performed a minimal debridement again in the subacromial space following this.  We then removed all instrumentation and closed all portal sites with interrupted nylon suture.  Xeroform well-padded sterile dressing was applied.   She was placed in a shoulder abduction pillow sling, awakened, extubated, and taken to recovery room in stable condition.  All final counts were correct.  There were no complications noted.  Of note, Benita Stabile, PA-C, assisted in the entire case.  His  assistance was crucial for facilitating all aspects of this case.  Postoperatively, we will have her get the rehabilitation process for rotator cuff repair.  TN/NUANCE  D:05/16/2019 T:05/16/2019 JOB:[id number]

## 2019-05-16 NOTE — Anesthesia Procedure Notes (Signed)
Procedure Name: Intubation Date/Time: 05/16/2019 7:32 AM Performed by: Maryella Shivers, CRNA Pre-anesthesia Checklist: Patient identified, Emergency Drugs available, Suction available and Patient being monitored Patient Re-evaluated:Patient Re-evaluated prior to induction Oxygen Delivery Method: Circle system utilized Preoxygenation: Pre-oxygenation with 100% oxygen Induction Type: IV induction Ventilation: Mask ventilation without difficulty Laryngoscope Size: Mac and 3 Grade View: Grade I Tube type: Oral Tube size: 7.0 mm Number of attempts: 1 Airway Equipment and Method: Stylet and Oral airway Placement Confirmation: ETT inserted through vocal cords under direct vision,  positive ETCO2 and breath sounds checked- equal and bilateral Secured at: 21 cm Tube secured with: Tape Dental Injury: Teeth and Oropharynx as per pre-operative assessment

## 2019-05-16 NOTE — Anesthesia Postprocedure Evaluation (Signed)
Anesthesia Post Note  Patient: [name]  Procedure(s) Performed: SHOULDER ARTHROSCOPY WITH ROTATOR CUFF REPAIR AND SUBACROMIAL DECOMPRESSION (Left Shoulder) RESECTION DISTAL CLAVICAL (Left Shoulder)     Patient location during evaluation: PACU Anesthesia Type: Regional and General Level of consciousness: awake and alert, oriented and patient cooperative Pain management: pain level controlled Vital Signs Assessment: post-procedure vital signs reviewed and stable Respiratory status: spontaneous breathing, nonlabored ventilation and respiratory function stable Cardiovascular status: blood pressure returned to baseline and stable Postop Assessment: no apparent nausea or vomiting Anesthetic complications: no    Last Vitals:  Vitals:   05/16/19 0915 05/16/19 0930  BP: (!) 140/58 140/64  Pulse: 65 61  Resp: 16 17  Temp:    SpO2: 100% 100%    Last Pain:  Vitals:   05/16/19 0930  TempSrc:   PainSc: 0-No pain                 [name]

## 2019-05-16 NOTE — Progress Notes (Signed)
Assisted Dr. Doroteo Glassman with left, ultrasound guided, interscalene  block. Side rails up, monitors on throughout procedure. See vital signs in flow sheet. Tolerated Procedure well.

## 2019-05-16 NOTE — Discharge Instructions (Signed)
Information for Discharge Teaching: EXPAREL (bupivacaine liposome injectable suspension)   Your surgeon or anesthesiologist gave you EXPAREL(bupivacaine) to help control your pain after surgery.   EXPAREL is a local anesthetic that provides pain relief by numbing the tissue around the surgical site.  EXPAREL is designed to release pain medication over time and can control pain for up to 72 hours.  Depending on how you respond to EXPAREL, you may require less pain medication during your recovery.  Possible side effects:  Temporary loss of sensation or ability to move in the area where bupivacaine was injected.  Nausea, vomiting, constipation  Rarely, numbness and tingling in your mouth or lips, lightheadedness, or anxiety may occur.  Call your doctor right away if you think you may be experiencing any of these sensations, or if you have other questions regarding possible side effects.  Follow all other discharge instructions given to you by your surgeon or nurse. Eat a healthy diet and drink plenty of water or other fluids.  If you return to the hospital for any reason within 96 hours following the administration of EXPAREL, it is important for health care providers to know that you have received this anesthetic. A teal colored band has been placed on your arm with the date, time and amount of EXPAREL you have received in order to alert and inform your health care providers. Please leave this armband in place for the full 96 hours following administration, and then you may remove the band  Expect left shoulder swelling and bloody drainage. Ice intermittently throughout the day. You can remove your dressings in 1-2 days and then place band-aids over the incisions daily. You can shower in 2 days and get your incisions wet in the shower, then place band-aids. You must sleep in your sling. Do not try to reach overhead or behind with your left arm. You can occasionally come out of your sling  to gently move your shoulder, elbow and wrist.   Post Anesthesia Home Care Instructions  Activity: Get plenty of rest for the remainder of the day. A responsible individual must stay with you for 24 hours following the procedure.  For the next 24 hours, DO NOT: -Drive a car -Paediatric nurse -Drink alcoholic beverages -Take any medication unless instructed by your physician -Make any legal decisions or sign important papers.  Meals: Start with liquid foods such as gelatin or soup. Progress to regular foods as tolerated. Avoid greasy, spicy, heavy foods. If nausea and/or vomiting occur, drink only clear liquids until the nausea and/or vomiting subsides. Call your physician if vomiting continues.  Special Instructions/Symptoms: Your throat may feel dry or sore from the anesthesia or the breathing tube placed in your throat during surgery. If this causes discomfort, gargle with warm salt water. The discomfort should disappear within 24 hours.  If you had a scopolamine patch placed behind your ear for the management of post- operative nausea and/or vomiting:  1. The medication in the patch is effective for 72 hours, after which it should be removed.  Wrap patch in a tissue and discard in the trash. Wash hands thoroughly with soap and water. 2. You may remove the patch earlier than 72 hours if you experience unpleasant side effects which may include dry mouth, dizziness or visual disturbances. 3. Avoid touching the patch. Wash your hands with soap and water after contact with the patch.   No tylenol until after 1pm today   Regional Anesthesia Blocks  1. Numbness or the inability  to move the "blocked" extremity may last from 3-48 hours after placement. The length of time depends on the medication injected and your individual response to the medication. If the numbness is not going away after 48 hours, call your surgeon.  2. The extremity that is blocked will need to be protected until  the numbness is gone and the  Strength has returned. Because you cannot feel it, you will need to take extra care to avoid injury. Because it may be weak, you may have difficulty moving it or using it. You may not know what position it is in without looking at it while the block is in effect.  3. For blocks in the legs and feet, returning to weight bearing and walking needs to be done carefully. You will need to wait until the numbness is entirely gone and the strength has returned. You should be able to move your leg and foot normally before you try and bear weight or walk. You will need someone to be with you when you first try to ensure you do not fall and possibly risk injury.  4. Bruising and tenderness at the needle site are common side effects and will resolve in a few days.  5. Persistent numbness or new problems with movement should be communicated to the surgeon or the [place] [phone] [place] [phone]).

## 2019-05-20 ENCOUNTER — Encounter (HOSPITAL_BASED_OUTPATIENT_CLINIC_OR_DEPARTMENT_OTHER): Payer: Self-pay | Admitting: Orthopaedic Surgery

## 2019-05-21 ENCOUNTER — Encounter: Payer: Self-pay | Admitting: Orthopaedic Surgery

## 2019-05-21 ENCOUNTER — Other Ambulatory Visit: Payer: Self-pay

## 2019-05-21 ENCOUNTER — Ambulatory Visit (INDEPENDENT_AMBULATORY_CARE_PROVIDER_SITE_OTHER): Payer: Medicare PPO | Admitting: Orthopaedic Surgery

## 2019-05-21 VITALS — BP 191/81 | HR 79 | Ht 68.0 in | Wt 241.0 lb

## 2019-05-21 DIAGNOSIS — M5126 Other intervertebral disc displacement, lumbar region: Secondary | ICD-10-CM

## 2019-05-21 DIAGNOSIS — M48061 Spinal stenosis, lumbar region without neurogenic claudication: Secondary | ICD-10-CM | POA: Insufficient documentation

## 2019-05-21 NOTE — Progress Notes (Signed)
Office Visit Note   Patient: [name]           Date of Birth: [birth year]           MRN: [id number] Visit Date: 05/21/2019              Requested by: [name], MD [address] [place],  [place] [address] PCP: [name], MD   Assessment & Plan: Visit Diagnoses:  1. Lumbar herniated disc   2. Spinal stenosis of lumbar region, unspecified whether neurogenic claudication present    Plan: Patient needs to get over left shoulder surgery and finished rehab of her shoulder surgery then she can progress with some lower extremity strengthening with physical therapy to work on quad strengthening.  I am unsure some of this is due to residual from her previous CVA or is related to previous back surgery a few years ago or some of the lateral recess narrowing that is now present at the L3-4 level.  Follow-Up Instructions: Return if symptoms worsen or fail to improve.   Orders:  No orders of the defined types were placed in this encounter.  No orders of the defined types were placed in this encounter.     Procedures: No procedures performed   Clinical Data: No additional findings.   Subjective: Chief Complaint  Patient presents with   Lower Back - Pain    HPI 68 year old female who had recent repair last week of supraspinatus tear with arthroscopic rotator cuff repair done by Dr. Zollie Beckers.  Patient had a fall in the restaurant when she describes being on the end of a bench was signaling to someone with both Arrien and the bench tipped over throwing her down.  She had recent rotator cuff repair which seemed to be doing well she had past history of a CVA.  Previous back surgery by Dr. Arnoldo Morale L2-3 on the left.  Recent MRI scan 04/21/2019 lumbar spine MRI which showed some progression to moderately severe stenosis at L3-4 with slight compression of both lateral recesses and new soft tissue disc protrusion into the L2-3 lateral recess which was her  previous operated level.  She had some moderate stenosis at L2-3 and moderate facet arthritis right and left at L4-5 level.  Patient states she cannot walk far may be a block she has trouble when she stands for period of time.  She states her activity levels been down and she is not done as well as she did before her CVA.  She has had more trouble getting up since her left arm is in a sling and she cannot use her left arm to assist in helping get her to the upright position.  Review of Systems 14 point update unchanged from Dr. Junius Roads is notes November 2020 other than as mentioned in HPI.  Of note is type 2 diabetes not on insulin on oral medication.   Objective: Vital Signs: BP (!) 191/81 (BP Location: Left Arm, Patient Position: Sitting)    Pulse 79    Ht 5\' 8"  (1.727 m)    Wt 241 lb (109.3 kg)    BMI 36.64 kg/m   Physical Exam Constitutional:      Appearance: She is well-developed.  HENT:     Head: Normocephalic.     Right Ear: External ear normal.     Left Ear: External ear normal.  Eyes:     Pupils: Pupils are equal, round, and reactive to light.  Neck:  Thyroid: No thyromegaly.     Trachea: No tracheal deviation.  Cardiovascular:     Rate and Rhythm: Normal rate.  Pulmonary:     Effort: Pulmonary effort is normal.  Abdominal:     Palpations: Abdomen is soft.  Skin:    General: Skin is warm and dry.  Neurological:     Mental Status: She is alert and oriented to person, place, and time.  Psychiatric:        Behavior: Behavior normal.     Ortho Exam patient has difficulty getting from sitting to standing position she is ambulating with a cane.  She has to rock back and forth multiple times repositioning her right arm until she finally can get into an upright position.  She has some mild valgus of her right knee denies knee pain mild crepitus with both knees flexion extension.  Patient has some bilateral quad weakness.  When she is upright she can ambulate with and without  the cane.  Specialty Comments:  No specialty comments available.  Imaging: No results found.   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 Multiple colonic polyps status post colonoscopy in October 2007, consistent with tubular adenoma, tubulovillous adenoma with no high-grade dysplasia or malignancy identified.    Constipation    Coronary artery disease    a. s/p CABG 06/2010: S-D2; S-PDA (at time of MV surgery).   Depression    Diabetes mellitus    type II   Family history of breast cancer    Family history of prostate cancer    Genetic testing 09/25/2017   Multi-Cancer panel (83 genes) @ [facility] - No pathogenic mutations detected   GERD (gastroesophageal reflux disease)    Hx of transient ischemic attack (TIA)    a. See stroke section.   Hyperlipidemia    Hypertension      Hypoxia    a. Has history of acute hypoxic respiratory failure in the setting of bronchitis/PNA or prior admissions.   MRSA infection    a. History of recurrent skin infection and soft tissue abscesses, with MRSA positive in the past.   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 mitral valve - s/p resection and MV repair 06/2010 Dr. [name].   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 Distal R MCA.   Stroke [facility])    a. 11/2009: mitral mass diagnosed at this time, also has distal R MCA stenosis, tx with coumadin. b. Readmitted 05/2010 with TIA symptoms - had not been taking Coumadin. s/p MV surgery 06/2010. Coumadin stopped 2013 after review of chart by Dr. [name] since mass was removed (stroke felt possibly related to this).     Family History  Problem Relation Age of Onset   Colon cancer Mother        dx 26s; deceased 27   Irritable bowel syndrome Mother    Prostate cancer Father        deceased 37s   Cirrhosis Sister        died of GI bleed associated with cirrhosis of the liver   Stroke Brother    Prostate cancer Brother 66       deceased 59   Diabetes Brother        x3   Kidney disease Brother    Cancer Brother 66       unk. type   Breast cancer Cousin        several maternal 1st and 2nd cousins with breast cancer    Past Surgical History:  Procedure Laterality Date   BREAST LUMPECTOMY Right    BREAST LUMPECTOMY WITH RADIOACTIVE SEED AND SENTINEL LYMPH NODE BIOPSY Right 07/20/2017   Procedure: RIGHT BREAST LUMPECTOMY WITH RADIOACTIVE SEED AND SENTINEL LYMPH NODE BIOPSY;  Surgeon: Alphonsa Overall, MD;  Location: [facility];  Service: General;  Laterality: Right;   CARDIAC CATHETERIZATION  04/03/2015   Procedure: Left Heart Cath and Cors/Grafts Angiography;  Surgeon: Peter M Martinique, MD;  Location: [facility];  Service: Cardiovascular;;    CORONARY ARTERY BYPASS GRAFT  1/12   history of ankle fractures requiring surgery     LUMBAR LAMINECTOMY/DECOMPRESSION MICRODISCECTOMY Left 04/15/2015   Procedure: LUMBAR LAMINECTOMY/DECOMPRESSION MICRODISCECTOMY 1 LEVEL;  Surgeon: Newman Pies, MD;  Location: [place] [facility];  Service: Neurosurgery;  Laterality: Left;  Left L23 microdiskectomy   MV repair and resection of mass  1/12   RESECTION DISTAL CLAVICAL Left 05/16/2019   Procedure: RESECTION DISTAL CLAVICAL;  Surgeon: Mcarthur Rossetti, MD;  Location: [facility];  Service: Orthopedics;  Laterality: Left;   SHOULDER ARTHROSCOPY WITH ROTATOR CUFF REPAIR AND SUBACROMIAL DECOMPRESSION Left 05/16/2019   Procedure: SHOULDER ARTHROSCOPY WITH ROTATOR CUFF REPAIR AND SUBACROMIAL DECOMPRESSION;  Surgeon: Mcarthur Rossetti, MD;  Location: [facility];  Service: Orthopedics;  Laterality: Left;   TONSILLECTOMY  1971   TOTAL ABDOMINAL HYSTERECTOMY  1978   VESICOVAGINAL FISTULA CLOSURE W/ TAH     Social History   Occupational History    Employer: UNEMPLOYED  Tobacco Use   Smoking status: Former Smoker    Quit date: 10/14/2010    Years since quitting: 8.6   Smokeless tobacco: Never Used  Substance and Sexual Activity   Alcohol use: Yes    Comment: social   Drug use: No   Sexual activity: Not Currently

## 2019-05-22 ENCOUNTER — Encounter: Payer: Self-pay | Admitting: Orthopaedic Surgery

## 2019-05-22 ENCOUNTER — Ambulatory Visit (INDEPENDENT_AMBULATORY_CARE_PROVIDER_SITE_OTHER): Payer: Medicare PPO | Admitting: Orthopaedic Surgery

## 2019-05-22 ENCOUNTER — Other Ambulatory Visit: Payer: Self-pay

## 2019-05-22 DIAGNOSIS — Z9889 Other specified postprocedural states: Secondary | ICD-10-CM

## 2019-05-22 MED ORDER — OXYCODONE-ACETAMINOPHEN 5-325 MG PO TABS: 1.0000 | ORAL_TABLET | ORAL | 0 refills | Status: DC | PRN

## 2019-05-22 NOTE — Progress Notes (Signed)
The patient is 1 week status post a left shoulder arthroscopy with repair of a full-thickness rotator cuff tear.  She is 67 years old.  We did find good integrity of the rotator cuff and this was definitely an acute tear with some retraction.  We were able to perform a double row repair.  She is in her sling.  She is having a significant amount of pain.  On exam her axillary nerve is functioning.  Her shoulder is weak to be expected.  I did remove all sutures from the shoulder.  There is no evidence of infection.  At this point it is essential to get an outpatient physical therapy to work on mobility of the left shoulder and eventual strengthening.  This can be done at current physical therapy here at [facility].  I will refill her oxycodone but have counseled her about using this sparingly.  She will need to wear the sling for the next 3 weeks.  I will see her back as she is going through therapy which she understands she will need for an extended period time to be able to get her strength back at hopefully 5 to 6 months.

## 2019-05-30 ENCOUNTER — Ambulatory Visit: Payer: Medicare PPO | Admitting: Physical Therapy

## 2019-06-05 ENCOUNTER — Telehealth: Payer: Self-pay | Admitting: Orthopaedic Surgery

## 2019-06-05 MED ORDER — OXYCODONE-ACETAMINOPHEN 5-325 MG PO TABS: 1.0000 | ORAL_TABLET | Freq: Four times a day (QID) | ORAL | 0 refills | Status: DC | PRN

## 2019-06-05 NOTE — Telephone Encounter (Signed)
Patient called to request an RX refill on her Oxycodone.  Patient uses [facility] on [facility].  She would like a call back when the RX has been called into the pharmacy.  CB#[phone].  Thank you.

## 2019-06-05 NOTE — Telephone Encounter (Signed)
Please advise 

## 2019-06-10 ENCOUNTER — Ambulatory Visit (INDEPENDENT_AMBULATORY_CARE_PROVIDER_SITE_OTHER): Payer: Medicare PPO | Admitting: Orthopaedic Surgery

## 2019-06-10 ENCOUNTER — Encounter: Payer: Self-pay | Admitting: Orthopaedic Surgery

## 2019-06-10 ENCOUNTER — Other Ambulatory Visit: Payer: Self-pay

## 2019-06-10 DIAGNOSIS — Z9889 Other specified postprocedural states: Secondary | ICD-10-CM

## 2019-06-10 MED ORDER — OXYCODONE-ACETAMINOPHEN 5-325 MG PO TABS: 1.0000 | ORAL_TABLET | Freq: Four times a day (QID) | ORAL | 0 refills | Status: DC | PRN

## 2019-06-10 NOTE — Progress Notes (Signed)
The patient is now almost 4 weeks status post a left shoulder rotator cuff repair of a full-thickness tear.  She will be 68 years old tomorrow.  She did miss her first physical therapy appointment due to feeling sick.  She does not remember when she is supposed to come again in terms of her reschedule appointment.  We will work on checking on that for her.  I believe that is here at [facility].  She feels like she is ready to come out of her sling.  She still reports shoulder pain and weakness.  On examination I was able to abduct her shoulder passively to 90 degrees and she can hold it there.  She was scared to do so.  Her incisions have healed nicely.  She is demonstrating that the rotator cuff is intact and axillary nerve is functioning.  I do feel it is necessary to get her into physical therapy to work on improving her shoulder function.  She understands this as well.  She can stop her sling from my standpoint.  I would like to see her back in 4 weeks and hopefully she will have had some physical therapy by then.  She understands it can take 5 to 6 months to get her good recovery from rotator cuff surgery and it could be certainly longer given her age.  I will refill her pain medication as well.

## 2019-06-12 ENCOUNTER — Other Ambulatory Visit: Payer: Self-pay | Admitting: Internal Medicine

## 2019-06-12 DIAGNOSIS — I10 Essential (primary) hypertension: Secondary | ICD-10-CM

## 2019-06-14 ENCOUNTER — Ambulatory Visit: Payer: Medicare PPO | Admitting: Physical Therapy

## 2019-06-17 ENCOUNTER — Other Ambulatory Visit: Payer: Self-pay | Admitting: Orthopaedic Surgery

## 2019-06-17 ENCOUNTER — Telehealth: Payer: Self-pay | Admitting: Orthopaedic Surgery

## 2019-06-17 MED ORDER — OXYCODONE-ACETAMINOPHEN 5-325 MG PO TABS: 1.0000 | ORAL_TABLET | Freq: Four times a day (QID) | ORAL | 0 refills | Status: DC | PRN

## 2019-06-17 NOTE — Telephone Encounter (Signed)
Patient called in requesting a refill for oxycodone prescription.. Patient stated to have medication called in at [facility] on [facility]. [place]. Patient phone number is [phone].

## 2019-06-17 NOTE — Telephone Encounter (Signed)
Please advise 

## 2019-06-19 ENCOUNTER — Other Ambulatory Visit: Payer: Self-pay | Admitting: Internal Medicine

## 2019-06-19 DIAGNOSIS — K219 Gastro-esophageal reflux disease without esophagitis: Secondary | ICD-10-CM

## 2019-06-24 ENCOUNTER — Ambulatory Visit (INDEPENDENT_AMBULATORY_CARE_PROVIDER_SITE_OTHER): Payer: Medicare PPO | Admitting: Physical Therapy

## 2019-06-24 ENCOUNTER — Other Ambulatory Visit: Payer: Self-pay

## 2019-06-24 ENCOUNTER — Encounter: Payer: Self-pay | Admitting: Physical Therapy

## 2019-06-24 DIAGNOSIS — M25612 Stiffness of left shoulder, not elsewhere classified: Secondary | ICD-10-CM

## 2019-06-24 DIAGNOSIS — R293 Abnormal posture: Secondary | ICD-10-CM | POA: Diagnosis not present

## 2019-06-24 DIAGNOSIS — M25512 Pain in left shoulder: Secondary | ICD-10-CM | POA: Diagnosis not present

## 2019-06-24 DIAGNOSIS — R600 Localized edema: Secondary | ICD-10-CM

## 2019-06-24 NOTE — Patient Instructions (Signed)
Access Code: [email or web address]  URL: [email or web address]  Date: 06/24/2019  Prepared by: Faustino Congress   Exercises Supine Shoulder Flexion with Dowel - 10 reps - 1 sets - 1-2 sec hold - 2x daily - 7x weekly Supine Shoulder External Rotation with Dowel - 10 reps - 1 sets - 1-2 sec hold - 2x daily - 7x weekly Supine Shoulder Abduction AAROM with Dowel - 10 reps - 1 sets - 1-2 hold - 2x daily - 7x weekly

## 2019-06-24 NOTE — Therapy (Signed)
[facility] [address] [place], [place], [address] Phone: [phone]   Fax:  [phone]  Physical Therapy Evaluation  Patient Details  Name: [name] MRN: [id number] Date of Birth: 1951-03-25 Referring Provider (PT): Jean Rosenthal, MD   Encounter Date: 06/24/2019  PT End of Session - 06/24/19 1132    Visit Number  1    Number of Visits  16    Date for PT Re-Evaluation  08/19/19    PT Start Time  1100    PT Stop Time  1142    PT Time Calculation (min)  42 min    Activity Tolerance  Patient tolerated treatment well    Behavior During Therapy  [facility] for tasks assessed/performed       Past Medical History:  Diagnosis Date  . Abscess in epidural space of L2-L5 lumbar spine 03/2006  . Anemia, iron deficiency   . Arthritis   . Breast cancer ([place]) 06/12/2017   right breast  . CAD (coronary artery disease), native coronary artery 03/30/2015  . Cataract   . Colon polyp    a. Multiple colonic polyps status post colonoscopy in October 2007, consistent with tubular adenoma, tubulovillous adenoma with no high-grade dysplasia or malignancy identified.   . Constipation   . Coronary artery disease    a. s/p CABG 06/2010: S-D2; S-PDA (at time of MV surgery).  . Depression   . Diabetes mellitus    type II  . Family history of breast cancer   . Family history of prostate cancer   . Genetic testing 09/25/2017   Multi-Cancer panel (83 genes) @ [facility] - No pathogenic mutations detected  . GERD (gastroesophageal reflux disease)   . Hx of transient ischemic attack (TIA)    a. See stroke section.  . Hyperlipidemia   . Hypertension   . Hypoxia    a. Has history of acute hypoxic respiratory failure in the setting of bronchitis/PNA or prior admissions.  [name] MRSA infection    a. History of recurrent skin infection and soft tissue abscesses, with MRSA positive in the past.  . 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 mitral valve  - s/p resection and MV repair 06/2010 Dr. [name].  . 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 Personal history of radiation therapy   . Sleep apnea    does not use CPAP  . Stenosis of middle cerebral artery    a. Distal R MCA.  . Stroke [facility])    a. 11/2009: mitral mass diagnosed at this time, also has distal R MCA stenosis, tx with coumadin. b. Readmitted 05/2010 with TIA symptoms - had not been taking Coumadin. s/p MV surgery 06/2010. Coumadin stopped 2013 after review of chart by Dr. [name] since mass was removed (stroke felt possibly related to this).     Past Surgical History:  Procedure Laterality Date  . BREAST LUMPECTOMY Right   . BREAST LUMPECTOMY WITH RADIOACTIVE SEED AND SENTINEL LYMPH NODE BIOPSY Right 07/20/2017   Procedure: RIGHT BREAST LUMPECTOMY WITH RADIOACTIVE SEED AND SENTINEL LYMPH NODE BIOPSY;  Surgeon: Alphonsa Overall, MD;  Location: [place];  Service: General;  Laterality: Right;  . CARDIAC CATHETERIZATION  04/03/2015   Procedure: Left Heart Cath and Cors/Grafts Angiography;  Surgeon: Peter M Martinique, MD;  Location: [place] CV LAB;  Service: Cardiovascular;;  . CORONARY ARTERY BYPASS GRAFT  1/12  . history of ankle fractures requiring surgery    . LUMBAR LAMINECTOMY/DECOMPRESSION MICRODISCECTOMY Left 04/15/2015   Procedure: LUMBAR  LAMINECTOMY/DECOMPRESSION MICRODISCECTOMY 1 LEVEL;  Surgeon: Newman Pies, MD;  Location: [place] NEURO ORS;  Service: Neurosurgery;  Laterality: Left;  Left L23 microdiskectomy  . MV repair and resection of mass  1/12  . RESECTION DISTAL CLAVICAL Left 05/16/2019   Procedure: RESECTION DISTAL CLAVICAL;  Surgeon: Mcarthur Rossetti, MD;  Location: [place];  Service: Orthopedics;  Laterality: Left;  . SHOULDER ARTHROSCOPY WITH ROTATOR CUFF REPAIR AND SUBACROMIAL DECOMPRESSION Left 05/16/2019   Procedure: SHOULDER ARTHROSCOPY WITH ROTATOR CUFF REPAIR AND SUBACROMIAL DECOMPRESSION;  Surgeon: Mcarthur Rossetti, MD;   Location: [place];  Service: Orthopedics;  Laterality: Left;  . TONSILLECTOMY  1971  . TOTAL ABDOMINAL HYSTERECTOMY  1978  . VESICOVAGINAL FISTULA CLOSURE W/ TAH      There were no vitals filed for this visit.  Subjective Assessment - 06/24/19 1104    Subjective  Pt is a 68 y/o female who presents to OPPT s/p Lt RTC repair on 05/16/19.  Pt reports sling d/c'ed ~ 2 weeks ago, and continues to c/o difficulty with LUE use.    Limitations  Lifting    Patient Stated Goals  regain use of Lt shoulder, improve motion and strength, improve pain    Currently in Pain?  Yes    Pain Score  4    up to 7/10; at best 3/10   Pain Location  Shoulder    Pain Orientation  Left    Pain Descriptors / Indicators  Throbbing;Sharp    Pain Type  Acute pain;Surgical pain    Pain Onset  More than a month ago    Pain Frequency  Constant    Aggravating Factors   using LUE (trying to reach), bed repositioning, showering    Pain Relieving Factors  medication, ice         [facility] PT Assessment - 06/24/19 1108      Assessment   Medical Diagnosis  Lt RTC repair    Referring Provider (PT)  Jean Rosenthal, MD    Onset Date/Surgical Date  05/16/19    Hand Dominance  Left    Next MD Visit  07/08/19    Prior Therapy  seen at cancer rehab for one visit      Precautions   Precautions  Shoulder      Restrictions   Weight Bearing Restrictions  No      Balance Screen   Has the patient fallen in the past 6 months  Yes    How many times?  1    Has the patient had a decrease in activity level because of a fear of falling?   Yes    Is the patient reluctant to leave their home because of a fear of falling?   No      Home Social worker  Private residence    Living Arrangements  Spouse/significant other;Children;Other relatives   daughter, grandchildren (86, 47, 53 y/o)   Available Help at Discharge  Family;Available 24 hours/day    Type of [place] - single point    Additional Comments  min/mod A with ADLs       Prior Function   Level of Independence  Independent    Vocation  Retired    Leisure  spend time with family, play games, TV; no regular exercise      Cognition   Overall Cognitive Status  Within Functional Limits for tasks assessed  Observation/Other Assessments   Observations  mild swelling present Lt shoulder      Posture/Postural Control   Posture/Postural Control  Postural limitations    Postural Limitations  Rounded Shoulders;Forward head      ROM / Strength   AROM / PROM / Strength  AROM;PROM;Strength      AROM   Overall AROM Comments  all motions measured supine    AROM Assessment Site  Shoulder    Right/Left Shoulder  Right;Left    Right Shoulder Flexion  157 Degrees    Right Shoulder ABduction  171 Degrees    Right Shoulder Internal Rotation  90 Degrees    Right Shoulder External Rotation  83 Degrees    Left Shoulder Flexion  --   unable   Left Shoulder ABduction  76 Degrees    Left Shoulder Internal Rotation  62 Degrees    Left Shoulder External Rotation  44 Degrees      PROM   PROM Assessment Site  Shoulder    Right/Left Shoulder  Left    Left Shoulder Flexion  96 Degrees    Left Shoulder ABduction  90 Degrees    Left Shoulder Internal Rotation  71 Degrees    Left Shoulder External Rotation  55 Degrees   pt guarding     Strength   Overall Strength  Unable to assess;Due to precautions    Overall Strength Comments  grossly 1-2/5 Lt shoulder at this time              [facility]  Lymphedema Life Impact Scale Total Score  41.18 %           [facility] Adult PT Treatment/Exercise - 06/24/19 1108      Exercises   Exercises  Shoulder      Shoulder Exercises: Supine   External Rotation  Left;AAROM;5 reps   cane   Flexion  AAROM;5 reps   cane   ABduction  Left;AAROM;5 reps   cane     Modalities    Modalities  Vasopneumatic      Vasopneumatic   Number Minutes Vasopneumatic   10 minutes    Vasopnuematic Location   Shoulder    Vasopneumatic Pressure  Low    Vasopneumatic Temperature   34 deg             PT Education - 06/24/19 1132    Education Details  HEP    Person(s) Educated  Patient    Methods  Explanation;Demonstration;Handout    Comprehension  Verbalized understanding;Returned demonstration;Need further instruction       PT Short Term Goals - 06/24/19 1258      PT SHORT TERM GOAL #1   Title  independent with initial HEP    Status  New    Target Date  07/22/19      PT SHORT TERM GOAL #2   Title  Lt shoulder AROM improved by 15 degress each direction for improved function    Status  New    Target Date  07/22/19        PT Long Term Goals - 06/24/19 1259      PT LONG TERM GOAL #1   Title  independent with advanced HEP    Status  New    Target Date  08/19/19      PT LONG TERM GOAL #2   Title  improve Lt shoulder AROM to [facility] for improved function    Status  New  Target Date  08/19/19      PT LONG TERM GOAL #3   Title  report pain < 4/10 with activity for improved function    Status  New    Target Date  08/19/19      PT LONG TERM GOAL #4   Title  demonstrate at least 4/5 Lt shoulder strength for improved function    Status  New    Target Date  08/19/19      PT LONG TERM GOAL #5   Title  n/a            Plan - 06/24/19 1133    Clinical Impression Statement  Pt is a 68 y/o female who presents to OPPT s/p Lt RTC repair on 07/15/18.  Pt demonstrates elevated pain levels, decreased strength and ROM as well as postural abnormaltiies affecting functional mobility.  Pt will benefit from PT to address deficits listed.    Personal Factors and Comorbidities  Comorbidity 3+    Comorbidities  CVA, HTN, depression, DM, L2-5 abscess in epidural space, arthritis, hx breast cancer, CAD s/p CABG    Examination-Activity Limitations  Bathing;Lift;Bed  Mobility;Reach Overhead;Self Feeding;Carry;Sleep;Hygiene/Grooming;Dressing    Examination-Participation Restrictions  Cleaning;Meal Prep    Stability/Clinical Decision Making  Evolving/Moderate complexity    Clinical Decision Making  Moderate    Rehab Potential  Good    PT Frequency  2x / week    PT Duration  8 weeks    PT Treatment/Interventions  ADLs/Self Care Home Management;Cryotherapy;Electrical Stimulation;Ultrasound;Moist Heat;Functional mobility training;Therapeutic activities;Therapeutic exercise;Patient/family education;Neuromuscular re-education;Manual techniques;Vasopneumatic Device;Taping;Dry needling;Passive range of motion    PT Next Visit Plan  review HEP, gentle posture exercises, PROM/AAROM exercises    PT Home Exercise Plan  Access Code: [email or web address]    Consulted and Agree with Plan of Care  Patient       Patient will benefit from skilled therapeutic intervention in order to improve the following deficits and impairments:  Increased fascial restricitons, Pain, Postural dysfunction, Decreased mobility, Decreased activity tolerance, Decreased range of motion, Decreased strength, Impaired UE functional use  Visit Diagnosis: Acute pain of left shoulder  Stiffness of left shoulder, not elsewhere classified  Abnormal posture  Localized edema     Problem List Patient Active Problem List   Diagnosis Date Noted  . Spinal stenosis of lumbar region 05/21/2019  . Complete tear of left rotator cuff 05/16/2019  . Uncontrolled type 2 diabetes mellitus with hyperglycemia ([place]) 12/19/2018  . Microalbuminuria 04/08/2018  . Genetic testing 09/25/2017  . Family history of breast cancer   . Family history of prostate cancer   . Malignant neoplasm of upper-outer quadrant of right breast in female, estrogen receptor positive ([place]) 06/26/2017  . Lumbar herniated disc 04/15/2015  . Abnormal nuclear stress test 04/03/2015  . 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 CAD (coronary  artery disease), native coronary artery 03/30/2015  . S/P mitral valve repair 11/16/2010  . Dyslipidemia 06/29/2010  . Diabetes ([place]) 09/29/2006  . ANEMIA-IRON DEFICIENCY 09/29/2006  . Essential hypertension 09/29/2006      Laureen Abrahams, PT, DPT 06/24/19 1:02 PM     [facility] [address] [place], [place], [address] Phone: [phone]   Fax:  [phone]  Name: Chastity Noland MRN: [id number] Date of Birth: Jul 24, 1950

## 2019-06-25 ENCOUNTER — Encounter: Payer: Self-pay | Admitting: Physical Therapy

## 2019-06-25 ENCOUNTER — Ambulatory Visit (INDEPENDENT_AMBULATORY_CARE_PROVIDER_SITE_OTHER): Payer: Medicare PPO | Admitting: Physical Therapy

## 2019-06-25 DIAGNOSIS — R600 Localized edema: Secondary | ICD-10-CM

## 2019-06-25 DIAGNOSIS — M25612 Stiffness of left shoulder, not elsewhere classified: Secondary | ICD-10-CM

## 2019-06-25 DIAGNOSIS — R293 Abnormal posture: Secondary | ICD-10-CM

## 2019-06-25 DIAGNOSIS — M25512 Pain in left shoulder: Secondary | ICD-10-CM

## 2019-06-25 NOTE — Therapy (Signed)
[facility] [address] [place], [place], [address] Phone: [phone]   Fax:  [phone]  Physical Therapy Treatment  Patient Details  Name: Barrie Sigmund MRN: [id number] Date of Birth: Feb 18, 1951 Referring Provider (PT): Jean Rosenthal, MD   Encounter Date: 06/25/2019  PT End of Session - 06/25/19 1434    Visit Number  2    Number of Visits  16    Date for PT Re-Evaluation  08/19/19    PT Start Time  1401    PT Stop Time  1444    PT Time Calculation (min)  43 min    Activity Tolerance  Patient tolerated treatment well    Behavior During Therapy  [facility] for tasks assessed/performed       Past Medical History:  Diagnosis Date  . Abscess in epidural space of L2-L5 lumbar spine 03/2006  . Anemia, iron deficiency   . Arthritis   . Breast cancer ([place]) 06/12/2017   right breast  . CAD (coronary artery disease), native coronary artery 03/30/2015  . Cataract   . Colon polyp    a. Multiple colonic polyps status post colonoscopy in October 2007, consistent with tubular adenoma, tubulovillous adenoma with no high-grade dysplasia or malignancy identified.   . Constipation   . Coronary artery disease    a. s/p CABG 06/2010: S-D2; S-PDA (at time of MV surgery).  . Depression   . Diabetes mellitus    type II  . Family history of breast cancer   . Family history of prostate cancer   . Genetic testing 09/25/2017   Multi-Cancer panel (83 genes) @ [facility] - No pathogenic mutations detected  . GERD (gastroesophageal reflux disease)   . Hx of transient ischemic attack (TIA)    a. See stroke section.  . Hyperlipidemia   . Hypertension   . Hypoxia    a. Has history of acute hypoxic respiratory failure in the setting of bronchitis/PNA or prior admissions.  [name] MRSA infection    a. History of recurrent skin infection and soft tissue abscesses, with MRSA positive in the past.  . 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 mitral valve  - s/p resection and MV repair 06/2010 Dr. [name].  . 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 Personal history of radiation therapy   . Sleep apnea    does not use CPAP  . Stenosis of middle cerebral artery    a. Distal R MCA.  . Stroke [facility])    a. 11/2009: mitral mass diagnosed at this time, also has distal R MCA stenosis, tx with coumadin. b. Readmitted 05/2010 with TIA symptoms - had not been taking Coumadin. s/p MV surgery 06/2010. Coumadin stopped 2013 after review of chart by Dr. [name] since mass was removed (stroke felt possibly related to this).     Past Surgical History:  Procedure Laterality Date  . BREAST LUMPECTOMY Right   . BREAST LUMPECTOMY WITH RADIOACTIVE SEED AND SENTINEL LYMPH NODE BIOPSY Right 07/20/2017   Procedure: RIGHT BREAST LUMPECTOMY WITH RADIOACTIVE SEED AND SENTINEL LYMPH NODE BIOPSY;  Surgeon: Alphonsa Overall, MD;  Location: [place];  Service: General;  Laterality: Right;  . CARDIAC CATHETERIZATION  04/03/2015   Procedure: Left Heart Cath and Cors/Grafts Angiography;  Surgeon: Peter M Martinique, MD;  Location: [place] CV LAB;  Service: Cardiovascular;;  . CORONARY ARTERY BYPASS GRAFT  1/12  . history of ankle fractures requiring surgery    . LUMBAR LAMINECTOMY/DECOMPRESSION MICRODISCECTOMY Left 04/15/2015   Procedure: LUMBAR  LAMINECTOMY/DECOMPRESSION MICRODISCECTOMY 1 LEVEL;  Surgeon: Newman Pies, MD;  Location: [place] NEUR[facility]S;  Service: Neurosurgery;  Laterality: Left;  Left L23 microdiskectomy  . MV repair and resection of mass  1/12  . RESECTION DISTAL CLAVICAL Left 05/16/2019   Procedure: RESECTION DISTAL CLAVICAL;  Surgeon: Mcarthur Rossetti, MD;  Location: [place];  Service: Orthopedics;  Laterality: Left;  . SHOULDER ARTHROSCOPY WITH ROTATOR CUFF REPAIR AND SUBACROMIAL DECOMPRESSION Left 05/16/2019   Procedure: SHOULDER ARTHROSCOPY WITH ROTATOR CUFF REPAIR AND SUBACROMIAL DECOMPRESSION;  Surgeon: Mcarthur Rossetti, MD;   Location: [place];  Service: Orthopedics;  Laterality: Left;  . TONSILLECTOMY  1971  . TOTAL ABDOMINAL HYSTERECTOMY  1978  . VESICOVAGINAL FISTULA CLOSURE W/ TAH      There were no vitals filed for this visit.  Subjective Assessment - 06/25/19 1405    Subjective  sore after yesterday; wasn't able to do any exercises due to soreness.    Limitations  Lifting    Patient Stated Goals  regain use of Lt shoulder, improve motion and strength, improve pain    Currently in Pain?  No/denies   up to 4/10 Lt shoulder   Pain Onset  More than a month ago                  Outpatient Rehab from [facility]19/[facility]  Lymphedema Life Impact Scale Total Score  41.18 %           [facility] Adult PT Treatment/Exercise - 06/25/19 1406      Shoulder Exercises: Supine   External Rotation  Left;AAROM;15 reps   cane   Flexion  AAROM;15 reps   cane   ABduction  Left;AAROM;15 reps   cane   Other Supine Exercises  scapular retraction 15x5 sec      Shoulder Exercises: Seated   Other Seated Exercises  shoulder rolls backwards x 15 reps      Shoulder Exercises: Pulleys   Flexion  2 minutes    Scaption  2 minutes      Vasopneumatic   Number Minutes Vasopneumatic   10 minutes    Vasopnuematic Location   Shoulder    Vasopneumatic Pressure  Low    Vasopneumatic Temperature   34 deg      Manual Therapy   Manual Therapy  Passive ROM    Passive ROM  Lt shoulder flexion/abduction/er/ir to tolerance               PT Short Term Goals - 06/24/19 1258      PT SHORT TERM GOAL #1   Title  independent with initial HEP    Status  New    Target Date  07/22/19      PT SHORT TERM GOAL #2   Title  Lt shoulder AROM improved by 15 degress each direction for improved function    Status  New    Target Date  07/22/19        PT Long Term Goals - 06/24/19 1259      PT LONG TERM GOAL #1   Title  independent with advanced HEP     Status  New    Target Date  08/19/19      PT LONG TERM GOAL #2   Title  improve Lt shoulder AROM to [facility] for improved function    Status  New    Target Date  08/19/19      PT LONG TERM GOAL #3  Title  report pain < 4/10 with activity for improved function    Status  New    Target Date  08/19/19      PT LONG TERM GOAL #4   Title  demonstrate at least 4/5 Lt shoulder strength for improved function    Status  New    Target Date  08/19/19      PT LONG TERM GOAL #5   Title  n/a            Plan - 06/25/19 1435    Clinical Impression Statement  Pt sore after initial eval yesterday.  Decreased pain and soreness after session today.  No goals met as only 2nd visit.    Personal Factors and Comorbidities  Comorbidity 3+    Comorbidities  CVA, HTN, depression, DM, L2-5 abscess in epidural space, arthritis, hx breast cancer, CAD s/p CABG    Examination-Activity Limitations  Bathing;Lift;Bed Mobility;Reach Overhead;Self Feeding;Carry;Sleep;Hygiene/Grooming;Dressing    Examination-Participation Restrictions  Cleaning;Meal Prep    Stability/Clinical Decision Making  Evolving/Moderate complexity    Rehab Potential  Good    PT Frequency  2x / week    PT Duration  8 weeks    PT Treatment/Interventions  ADLs/Self Care Home Management;Cryotherapy;Electrical Stimulation;Ultrasound;Moist Heat;Functional mobility training;Therapeutic activities;Therapeutic exercise;Patient/family education;Neuromuscular re-education;Manual techniques;Vasopneumatic Device;Taping;Dry needling;Passive range of motion    PT Next Visit Plan  review HEP, gentle posture exercises, PROM/AAROM exercises    PT Home Exercise Plan  Access Code: [email or web address]    Consulted and Agree with Plan of Care  Patient       Patient will benefit from skilled therapeutic intervention in order to improve the following deficits and impairments:  Increased fascial restricitons, Pain, Postural dysfunction, Decreased mobility, Decreased activity  tolerance, Decreased range of motion, Decreased strength, Impaired UE functional use  Visit Diagnosis: Acute pain of left shoulder  Stiffness of left shoulder, not elsewhere classified  Abnormal posture  Localized edema     Problem List Patient Active Problem List   Diagnosis Date Noted  . Spinal stenosis of lumbar region 05/21/2019  . Complete tear of left rotator cuff 05/16/2019  . Uncontrolled type 2 diabetes mellitus with hyperglycemia ([place]) 12/19/2018  . Microalbuminuria 04/08/2018  . Genetic testing 09/25/2017  . Family history of breast cancer   . Family history of prostate cancer   . Malignant neoplasm of upper-outer quadrant of right breast in female, estrogen receptor positive ([place]) 06/26/2017  . Lumbar herniated disc 04/15/2015  . Abnormal nuclear stress test 04/03/2015  . 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 CAD (coronary artery disease), native coronary artery 03/30/2015  . S/P mitral valve repair 11/16/2010  . Dyslipidemia 06/29/2010  . Diabetes ([place]) 09/29/2006  . ANEMIA-IRON DEFICIENCY 09/29/2006  . Essential hypertension 09/29/2006      Laureen Abrahams, PT, DPT 06/25/19 2:36 PM     [facility] [address] [place], [place], [address] Phone: [phone]   Fax:  [phone]  Name: Chela Sutphen MRN: [id number] Date of Birth: 06/02/1951

## 2019-07-04 ENCOUNTER — Telehealth: Payer: Self-pay | Admitting: Orthopaedic Surgery

## 2019-07-04 ENCOUNTER — Other Ambulatory Visit: Payer: Self-pay | Admitting: Orthopaedic Surgery

## 2019-07-04 MED ORDER — OXYCODONE-ACETAMINOPHEN 5-325 MG PO TABS: 1.0000 | ORAL_TABLET | Freq: Four times a day (QID) | ORAL | 0 refills | Status: DC | PRN

## 2019-07-04 NOTE — Telephone Encounter (Signed)
Patient called. She would like a refill on Oxycodone. Her call back number is [phone]

## 2019-07-04 NOTE — Telephone Encounter (Signed)
Patient aware of the below message  

## 2019-07-04 NOTE — Telephone Encounter (Signed)
I did send in some more oxycodone.  Please call her and let her know that after this prescription we would need to wean her down to hydrocodone because we need to have her off of narcotics in another month.  She is almost 2 months out from her surgery.

## 2019-07-04 NOTE — Telephone Encounter (Signed)
Please advise 

## 2019-07-08 ENCOUNTER — Ambulatory Visit (INDEPENDENT_AMBULATORY_CARE_PROVIDER_SITE_OTHER): Payer: Medicare PPO | Admitting: Orthopaedic Surgery

## 2019-07-08 ENCOUNTER — Other Ambulatory Visit: Payer: Self-pay

## 2019-07-08 ENCOUNTER — Encounter: Payer: Self-pay | Admitting: Orthopaedic Surgery

## 2019-07-08 DIAGNOSIS — Z9889 Other specified postprocedural states: Secondary | ICD-10-CM

## 2019-07-08 NOTE — Progress Notes (Signed)
The patient is now 7/2 weeks status post a left shoulder arthroscopic rotator cuff repair.  She is 69 years old and active.  She does report decreased pain with improved motion and strength.  I did let her know today that we need to start weaning her narcotics use.  She has been on oxycodone for long period of time with taking Percocet.  I did refill this last week and let her know that we would need to wean her to hydrocodone next.  On exam she has much better left shoulder function in the last visit.  Her abduction is improving and there is improving strength as well.  She will continue outpatient physical therapy and we will see her back in 4 weeks to see how she is doing overall.  She still understands this can take 5 to 6 months to get a good recovery and that some of her limitations may be related to the age of her fibers in her shoulder but otherwise I do feel that she is made great progress over the last 4 weeks.

## 2019-07-09 ENCOUNTER — Encounter: Payer: Medicare PPO | Admitting: Physical Therapy

## 2019-07-09 ENCOUNTER — Telehealth: Payer: Self-pay | Admitting: Physical Therapy

## 2019-07-09 ENCOUNTER — Ambulatory Visit: Payer: Medicare PPO | Admitting: Physical Therapy

## 2019-07-09 NOTE — Telephone Encounter (Signed)
Pt no show for PT appointment today. They were contacted and informed of this and they state they overslept. They were provided the date and time of their next appointment and she confirms she will be there. They were instructed to call us to let us know if they cannot make their appointment.  Elsie Ra, PT, DPT 07/09/19 9:40 AM

## 2019-07-10 ENCOUNTER — Other Ambulatory Visit: Payer: Self-pay

## 2019-07-10 ENCOUNTER — Ambulatory Visit (INDEPENDENT_AMBULATORY_CARE_PROVIDER_SITE_OTHER): Payer: Medicare PPO | Admitting: Physical Therapy

## 2019-07-10 DIAGNOSIS — M25612 Stiffness of left shoulder, not elsewhere classified: Secondary | ICD-10-CM | POA: Diagnosis not present

## 2019-07-10 DIAGNOSIS — R293 Abnormal posture: Secondary | ICD-10-CM | POA: Diagnosis not present

## 2019-07-10 DIAGNOSIS — M25512 Pain in left shoulder: Secondary | ICD-10-CM

## 2019-07-10 DIAGNOSIS — R600 Localized edema: Secondary | ICD-10-CM

## 2019-07-10 NOTE — Therapy (Signed)
[facility] [address] [place], [place], [address] Phone: [phone]   Fax:  [phone]  Physical Therapy Treatment  Patient Details  Name: [name] MRN: [id number] Date of Birth: 1950/09/12 Referring Provider (PT): Jean Rosenthal, MD   Encounter Date: 07/10/2019  PT End of Session - 07/10/19 1304    Visit Number  3    Number of Visits  16    Date for PT Re-Evaluation  08/19/19    PT Start Time  9833    PT Stop Time  1150    PT Time Calculation (min)  45 min    Activity Tolerance  Patient tolerated treatment well    Behavior During Therapy  [facility] for tasks assessed/performed       Past Medical History:  Diagnosis Date  . Abscess in epidural space of L2-L5 lumbar spine 03/2006  . Anemia, iron deficiency   . Arthritis   . Breast cancer ([place]) 06/12/2017   right breast  . CAD (coronary artery disease), native coronary artery 03/30/2015  . Cataract   . Colon polyp    a. Multiple colonic polyps status post colonoscopy in October 2007, consistent with tubular adenoma, tubulovillous adenoma with no high-grade dysplasia or malignancy identified.   . Constipation   . Coronary artery disease    a. s/p CABG 06/2010: S-D2; S-PDA (at time of MV surgery).  . Depression   . Diabetes mellitus    type II  . Family history of breast cancer   . Family history of prostate cancer   . Genetic testing 09/25/2017   Multi-Cancer panel (83 genes) @ [facility] - No pathogenic mutations detected  . GERD (gastroesophageal reflux disease)   . Hx of transient ischemic attack (TIA)    a. See stroke section.  . Hyperlipidemia   . Hypertension   . Hypoxia    a. Has history of acute hypoxic respiratory failure in the setting of bronchitis/PNA or prior admissions.  [name] MRSA infection    a. History of recurrent skin infection and soft tissue abscesses, with MRSA positive in the past.  . 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 mitral valve -  s/p resection and MV repair 06/2010 Dr. [name].  . 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 Personal history of radiation therapy   . Sleep apnea    does not use CPAP  . Stenosis of middle cerebral artery    a. Distal R MCA.  . Stroke [facility])    a. 11/2009: mitral mass diagnosed at this time, also has distal R MCA stenosis, tx with coumadin. b. Readmitted 05/2010 with TIA symptoms - had not been taking Coumadin. s/p MV surgery 06/2010. Coumadin stopped 2013 after review of chart by Dr. [name] since mass was removed (stroke felt possibly related to this).     Past Surgical History:  Procedure Laterality Date  . BREAST LUMPECTOMY Right   . BREAST LUMPECTOMY WITH RADIOACTIVE SEED AND SENTINEL LYMPH NODE BIOPSY Right 07/20/2017   Procedure: RIGHT BREAST LUMPECTOMY WITH RADIOACTIVE SEED AND SENTINEL LYMPH NODE BIOPSY;  Surgeon: Alphonsa Overall, MD;  Location: [place];  Service: General;  Laterality: Right;  . CARDIAC CATHETERIZATION  04/03/2015   Procedure: Left Heart Cath and Cors/Grafts Angiography;  Surgeon: Peter M Martinique, MD;  Location: [place] CV LAB;  Service: Cardiovascular;;  . CORONARY ARTERY BYPASS GRAFT  1/12  . history of ankle fractures requiring surgery    . LUMBAR LAMINECTOMY/DECOMPRESSION MICRODISCECTOMY Left 04/15/2015   Procedure: LUMBAR  LAMINECTOMY/DECOMPRESSION MICRODISCECTOMY 1 LEVEL;  Surgeon: Newman Pies, MD;  Location: [place] NEURO ORS;  Service: Neurosurgery;  Laterality: Left;  Left L23 microdiskectomy  . MV repair and resection of mass  1/12  . RESECTION DISTAL CLAVICAL Left 05/16/2019   Procedure: RESECTION DISTAL CLAVICAL;  Surgeon: Mcarthur Rossetti, MD;  Location: [place];  Service: Orthopedics;  Laterality: Left;  . SHOULDER ARTHROSCOPY WITH ROTATOR CUFF REPAIR AND SUBACROMIAL DECOMPRESSION Left 05/16/2019   Procedure: SHOULDER ARTHROSCOPY WITH ROTATOR CUFF REPAIR AND SUBACROMIAL DECOMPRESSION;  Surgeon: Mcarthur Rossetti, MD;   Location: [place];  Service: Orthopedics;  Laterality: Left;  . TONSILLECTOMY  1971  . TOTAL ABDOMINAL HYSTERECTOMY  1978  . VESICOVAGINAL FISTULA CLOSURE W/ TAH      There were no vitals filed for this visit.  Subjective Assessment - 07/10/19 1259    Subjective  Im doing good, pain was 7 last night but no pain today after taking pain pill.    Limitations  Lifting    Patient Stated Goals  regain use of Lt shoulder, improve motion and strength, improve pain    Pain Onset  More than a month ago         [facility] PT Assessment - 07/10/19 0001      Assessment   Medical Diagnosis  Lt RTC repair    Referring Provider (PT)  Jean Rosenthal, MD    Onset Date/Surgical Date  05/16/19    Hand Dominance  Left      AROM   Left Shoulder Flexion  --    Left Shoulder ABduction  --    Left Shoulder External Rotation  --      PROM   Left Shoulder Flexion  147 Degrees    Left Shoulder ABduction  165 Degrees    Left Shoulder Internal Rotation  --   [facility]   Left Shoulder External Rotation  65 Degrees              Outpatient Re[facility]19/[facility]  Lymphedema Life Impact Scale Total Score  41.18 %           [facility] Adult PT Treatment/Exercise - 07/10/19 0001      Shoulder Exercises: Seated   Flexion  Both;10 reps    Flexion Limitations  2 sets of 5 due to weakness and shrug compensation, cues to stop in rest to avod shrugging      Shoulder Exercises: Standing   External Rotation  Left;10 reps    Theraband Level (Shoulder External Rotation)  Level 2 (Red)    Internal Rotation  Left;10 reps    Theraband Level (Shoulder Internal Rotation)  Level 2 (Red)    Extension  Both;20 reps    Theraband Level (Shoulder Extension)  Level 2 (Red)    Row  Both;15 reps    Theraband Level (Shoulder Row)  Level 2 (Red)      Shoulder Exercises: Pulleys   Flexion  2 minutes    Scaption  2 minutes      Shoulder Exercises:  ROM/Strengthening   Ranger  10 reps ea into flexion, then circles, then scaption      Vasopneumatic   Number Minutes Vasopneumatic   10 minutes    Vasopnuematic Location   Shoulder    Vasopneumatic Pressure  Low    Vasopneumatic Temperature   34 deg      Manual Therapy   Passive ROM  Lt shoulder flexion/abduction/er/ir to tolerance  PT Education - 07/10/19 1303    Education Details  HEP progression to add strengthening    Person(s) Educated  Patient    Methods  Explanation;Demonstration;Verbal cues;Handout    Comprehension  Verbalized understanding;Returned demonstration       PT Short Term Goals - 06/24/19 1258      PT SHORT TERM GOAL #1   Title  independent with initial HEP    Status  New    Target Date  07/22/19      PT SHORT TERM GOAL #2   Title  Lt shoulder AROM improved by 15 degress each direction for improved function    Status  New    Target Date  07/22/19        PT Long Term Goals - 06/24/19 1259      PT LONG TERM GOAL #1   Title  independent with advanced HEP    Status  New    Target Date  08/19/19      PT LONG TERM GOAL #2   Title  improve Lt shoulder AROM to [facility] for improved function    Status  New    Target Date  08/19/19      PT LONG TERM GOAL #3   Title  report pain < 4/10 with activity for improved function    Status  New    Target Date  08/19/19      PT LONG TERM GOAL #4   Title  demonstrate at least 4/5 Lt shoulder strength for improved function    Status  New    Target Date  08/19/19      PT LONG TERM GOAL #5   Title  n/a            Plan - 07/10/19 1304    Clinical Impression Statement  She is now 8 weeks out and ROM is doing well. Progressed her strengthening program to tolerance and gave her more strengthenig to do with HEP. PT will continue to progress as able.    Personal Factors and Comorbidities  Comorbidity 3+    Comorbidities  CVA, HTN, depression, DM, L2-5 abscess in epidural space, arthritis, hx  breast cancer, CAD s/p CABG    Examination-Activity Limitations  Bathing;Lift;Bed Mobility;Reach Overhead;Self Feeding;Carry;Sleep;Hygiene/Grooming;Dressing    Examination-Participation Restrictions  Cleaning;Meal Prep    Stability/Clinical Decision Making  Evolving/Moderate complexity    Rehab Potential  Good    PT Frequency  2x / week    PT Duration  8 weeks    PT Treatment/Interventions  ADLs/Self Care Home Management;Cryotherapy;Electrical Stimulation;Ultrasound;Moist Heat;Functional mobility training;Therapeutic activities;Therapeutic exercise;Patient/family education;Neuromuscular re-education;Manual techniques;Vasopneumatic Device;Taping;Dry needling;Passive range of motion    PT Next Visit Plan  review HEP, gentle posture exercises, PROM/AAROM exercises    PT Home Exercise Plan  Access Code: [email or web address]    Consulted and Agree with Plan of Care  Patient       Patient will benefit from skilled therapeutic intervention in order to improve the following deficits and impairments:  Increased fascial restricitons, Pain, Postural dysfunction, Decreased mobility, Decreased activity tolerance, Decreased range of motion, Decreased strength, Impaired UE functional use  Visit Diagnosis: Acute pain of left shoulder  Stiffness of left shoulder, not elsewhere classified  Abnormal posture  Localized edema     Problem List Patient Active Problem List   Diagnosis Date Noted  . Spinal stenosis of lumbar region 05/21/2019  . Complete tear of left rotator cuff 05/16/2019  . Uncontrolled type 2 diabetes mellitus with hyperglycemia ([place]) 12/19/2018  .  Microalbuminuria 04/08/2018  . Genetic testing 09/25/2017  . Family history of breast cancer   . Family history of prostate cancer   . Malignant neoplasm of upper-outer quadrant of right breast in female, estrogen receptor positive ([place]) 06/26/2017  . Lumbar herniated disc 04/15/2015  . Abnormal nuclear stress test 04/03/2015  . 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 CAD (coronary artery disease), native coronary artery 03/30/2015  . S/P mitral valve repair 11/16/2010  . Dyslipidemia 06/29/2010  . Diabetes ([place]) 09/29/2006  . ANEMIA-IRON DEFICIENCY 09/29/2006  . Essential hypertension 09/29/2006    Debbe Odea ,PT,DPT 07/10/2019, 1:06 PM  [facility] [address] [place], [place], [address] Phone: [phone]   Fax:  [phone]  Name: [name] MRN: [id number] Date of Birth: 04/28/1951

## 2019-07-10 NOTE — Patient Instructions (Signed)
Access Code: [email or web address]  URL: [email or web address]  Date: 07/10/2019  Prepared by: Elsie Ra   Exercises  Supine Shoulder Flexion with Dowel - 10 reps - 1 sets - 1-2 sec hold - 2x daily - 7x weekly  Supine Shoulder External Rotation with Dowel - 10 reps - 1 sets - 1-2 sec hold - 2x daily - 7x weekly  Supine Shoulder Abduction AAROM with Dowel - 10 reps - 1 sets - 1-2 hold - 2x daily - 7x weekly  Standing Shoulder Flexion to 90 Degrees - 10 reps - 3 sets - 2x daily - 6x weekly  Standing Row with Resistance - 10 reps - 2-3 sets - 2x daily - 6x weekly  Shoulder extension with resistance - Neutral - 10 reps - 2-3 sets - 2x daily - 6x weekly  Shoulder External Rotation with Anchored Resistance - 10 reps - 2-3 sets - 2x daily - 6x weekly  Shoulder Internal Rotation with Resistance - 10 reps - 2-3 sets - 2x daily - 6x weekly

## 2019-07-15 ENCOUNTER — Other Ambulatory Visit: Payer: Self-pay

## 2019-07-15 ENCOUNTER — Encounter: Payer: Self-pay | Admitting: Physical Therapy

## 2019-07-15 ENCOUNTER — Ambulatory Visit (INDEPENDENT_AMBULATORY_CARE_PROVIDER_SITE_OTHER): Payer: Medicare PPO | Admitting: Physical Therapy

## 2019-07-15 DIAGNOSIS — R293 Abnormal posture: Secondary | ICD-10-CM | POA: Diagnosis not present

## 2019-07-15 DIAGNOSIS — R600 Localized edema: Secondary | ICD-10-CM | POA: Diagnosis not present

## 2019-07-15 DIAGNOSIS — M25612 Stiffness of left shoulder, not elsewhere classified: Secondary | ICD-10-CM | POA: Diagnosis not present

## 2019-07-15 DIAGNOSIS — M25512 Pain in left shoulder: Secondary | ICD-10-CM

## 2019-07-15 NOTE — Therapy (Signed)
[facility] [address] [place], [place], [address] Phone: [phone]   Fax:  [phone]  Physical Therapy Treatment  Patient Details  Name: [name] MRN: [id number] Date of Birth: Aug 12, 1950 Referring Provider (PT): Jean Rosenthal, MD   Encounter Date: 07/15/2019  PT End of Session - 07/15/19 1244    Visit Number  4    Number of Visits  16    Date for PT Re-Evaluation  08/19/19    PT Start Time  4235    PT Stop Time  1105    PT Time Calculation (min)  50 min    Activity Tolerance  Patient tolerated treatment well    Behavior During Therapy  [facility] for tasks assessed/performed       Past Medical History:  Diagnosis Date  . Abscess in epidural space of L2-L5 lumbar spine 03/2006  . Anemia, iron deficiency   . Arthritis   . Breast cancer ([place]) 06/12/2017   right breast  . CAD (coronary artery disease), native coronary artery 03/30/2015  . Cataract   . Colon polyp    a. Multiple colonic polyps status post colonoscopy in October 2007, consistent with tubular adenoma, tubulovillous adenoma with no high-grade dysplasia or malignancy identified.   . Constipation   . Coronary artery disease    a. s/p CABG 06/2010: S-D2; S-PDA (at time of MV surgery).  . Depression   . Diabetes mellitus    type II  . Family history of breast cancer   . Family history of prostate cancer   . Genetic testing 09/25/2017   Multi-Cancer panel (83 genes) @ [facility] - No pathogenic mutations detected  . GERD (gastroesophageal reflux disease)   . Hx of transient ischemic attack (TIA)    a. See stroke section.  . Hyperlipidemia   . Hypertension   . Hypoxia    a. Has history of acute hypoxic respiratory failure in the setting of bronchitis/PNA or prior admissions.  [name] MRSA infection    a. History of recurrent skin infection and soft tissue abscesses, with MRSA positive in the past.  . 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 mitral valve -  s/p resection and MV repair 06/2010 Dr. [name].  . 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 Personal history of radiation therapy   . Sleep apnea    does not use CPAP  . Stenosis of middle cerebral artery    a. Distal R MCA.  . Stroke [facility])    a. 11/2009: mitral mass diagnosed at this time, also has distal R MCA stenosis, tx with coumadin. b. Readmitted 05/2010 with TIA symptoms - had not been taking Coumadin. s/p MV surgery 06/2010. Coumadin stopped 2013 after review of chart by Dr. [name] since mass was removed (stroke felt possibly related to this).     Past Surgical History:  Procedure Laterality Date  . BREAST LUMPECTOMY Right   . BREAST LUMPECTOMY WITH RADIOACTIVE SEED AND SENTINEL LYMPH NODE BIOPSY Right 07/20/2017   Procedure: RIGHT BREAST LUMPECTOMY WITH RADIOACTIVE SEED AND SENTINEL LYMPH NODE BIOPSY;  Surgeon: Alphonsa Overall, MD;  Location: [place];  Service: General;  Laterality: Right;  . CARDIAC CATHETERIZATION  04/03/2015   Procedure: Left Heart Cath and Cors/Grafts Angiography;  Surgeon: Peter M Martinique, MD;  Location: [place] CV LAB;  Service: Cardiovascular;;  . CORONARY ARTERY BYPASS GRAFT  1/12  . history of ankle fractures requiring surgery    . LUMBAR LAMINECTOMY/DECOMPRESSION MICRODISCECTOMY Left 04/15/2015   Procedure: LUMBAR  LAMINECTOMY/DECOMPRESSION MICRODISCECTOMY 1 LEVEL;  Surgeon: Newman Pies, MD;  Location: [place] NEUR[facility]S;  Service: Neurosurgery;  Laterality: Left;  Left L23 microdiskectomy  . MV repair and resection of mass  1/12  . RESECTION DISTAL CLAVICAL Left 05/16/2019   Procedure: RESECTION DISTAL CLAVICAL;  Surgeon: Mcarthur Rossetti, MD;  Location: [place];  Service: Orthopedics;  Laterality: Left;  . SHOULDER ARTHROSCOPY WITH ROTATOR CUFF REPAIR AND SUBACROMIAL DECOMPRESSION Left 05/16/2019   Procedure: SHOULDER ARTHROSCOPY WITH ROTATOR CUFF REPAIR AND SUBACROMIAL DECOMPRESSION;  Surgeon: Mcarthur Rossetti, MD;   Location: [place];  Service: Orthopedics;  Laterality: Left;  . TONSILLECTOMY  1971  . TOTAL ABDOMINAL HYSTERECTOMY  1978  . VESICOVAGINAL FISTULA CLOSURE W/ TAH      There were no vitals filed for this visit.  Subjective Assessment - 07/15/19 1020    Subjective  Lt shoulder was really sore and painful after last session.  Scheduled for 1st COVID vaccine tomorrow.    Limitations  Lifting    Patient Stated Goals  regain use of Lt shoulder, improve motion and strength, improve pain    Currently in Pain?  Yes    Pain Score  4     Pain Location  Shoulder    Pain Orientation  Left    Pain Descriptors / Indicators  Throbbing;Sharp    Pain Type  Acute pain;Surgical pain    Pain Onset  More than a month ago    Pain Frequency  Constant    Aggravating Factors   using LUE    Pain Relieving Factors  meds, ice                  Outpatient Rehab from 10/19/2017 in [place]  Lymphedema Life Impact Scale Total Score  41.18 %           [facility] Adult PT Treatment/Exercise - 07/15/19 1022      Shoulder Exercises: Supine   Flexion  AROM;Left;10 reps    Flexion Limitations  ataxia noted      Shoulder Exercises: Sidelying   External Rotation  AROM;Left;10 reps    ABduction  AROM;Left;10 reps      Shoulder Exercises: Standing   Other Standing Exercises  wall ladder flexion x 10 reps      Shoulder Exercises: Pulleys   Flexion  2 minutes    Scaption  2 minutes      Shoulder Exercises: ROM/Strengthening   Ranger  10 reps ea into flexion, then circles, then scaption      Vasopneumatic   Number Minutes Vasopneumatic   10 minutes    Vasopnuematic Location   Shoulder    Vasopneumatic Pressure  Low    Vasopneumatic Temperature   34 deg      Manual Therapy   Passive ROM  Lt shoulder flexion/abduction/er/ir to tolerance               PT Short Term Goals - 06/24/19 1258      PT SHORT TERM GOAL #1   Title   independent with initial HEP    Status  New    Target Date  07/22/19      PT SHORT TERM GOAL #2   Title  Lt shoulder AROM improved by 15 degress each direction for improved function    Status  New    Target Date  07/22/19        PT Long Term Goals - 06/24/19 1259  PT LONG TERM GOAL #1   Title  independent with advanced HEP    Status  New    Target Date  08/19/19      PT LONG TERM GOAL #2   Title  improve Lt shoulder AROM to [facility] for improved function    Status  New    Target Date  08/19/19      PT LONG TERM GOAL #3   Title  report pain < 4/10 with activity for improved function    Status  New    Target Date  08/19/19      PT LONG TERM GOAL #4   Title  demonstrate at least 4/5 Lt shoulder strength for improved function    Status  New    Target Date  08/19/19      PT LONG TERM GOAL #5   Title  n/a            Plan - 07/15/19 1245    Clinical Impression Statement  Pt tolerated session well today progressing AROM and strengthening without increase in pain. Lt sided ataxia noted, which pt reports is baseline from CVA ~10 yrs ago.  No ataxia noted with Rt sided movement.    Personal Factors and Comorbidities  Comorbidity 3+    Comorbidities  CVA, HTN, depression, DM, L2-5 abscess in epidural space, arthritis, hx breast cancer, CAD s/p CABG    Examination-Activity Limitations  Bathing;Lift;Bed Mobility;Reach Overhead;Self Feeding;Carry;Sleep;Hygiene/Grooming;Dressing    Examination-Participation Restrictions  Cleaning;Meal Prep    Stability/Clinical Decision Making  Evolving/Moderate complexity    Rehab Potential  Good    PT Frequency  2x / week    PT Duration  8 weeks    PT Treatment/Interventions  ADLs/Self Care Home Management;Cryotherapy;Electrical Stimulation;Ultrasound;Moist Heat;Functional mobility training;Therapeutic activities;Therapeutic exercise;Patient/family education;Neuromuscular re-education;Manual techniques;Vasopneumatic Device;Taping;Dry  needling;Passive range of motion    PT Next Visit Plan  review HEP, gentle posture exercises, progress AROM/strengthening exercises    PT Home Exercise Plan  Access Code: [email or web address]    Consulted and Agree with Plan of Care  Patient       Patient will benefit from skilled therapeutic intervention in order to improve the following deficits and impairments:  Increased fascial restricitons, Pain, Postural dysfunction, Decreased mobility, Decreased activity tolerance, Decreased range of motion, Decreased strength, Impaired UE functional use  Visit Diagnosis: Acute pain of left shoulder  Stiffness of left shoulder, not elsewhere classified  Abnormal posture  Localized edema     Problem List Patient Active Problem List   Diagnosis Date Noted  . Spinal stenosis of lumbar region 05/21/2019  . Complete tear of left rotator cuff 05/16/2019  . Uncontrolled type 2 diabetes mellitus with hyperglycemia ([place]) 12/19/2018  . Microalbuminuria 04/08/2018  . Genetic testing 09/25/2017  . Family history of breast cancer   . Family history of prostate cancer   . Malignant neoplasm of upper-outer quadrant of right breast in female, estrogen receptor positive ([place]) 06/26/2017  . Lumbar herniated disc 04/15/2015  . Abnormal nuclear stress test 04/03/2015  . 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 CAD (coronary artery disease), native coronary artery 03/30/2015  . S/P mitral valve repair 11/16/2010  . Dyslipidemia 06/29/2010  . Diabetes ([place]) 09/29/2006  . ANEMIA-IRON DEFICIENCY 09/29/2006  . Essential hypertension 09/29/2006         Laureen Abrahams, PT, DPT 07/15/19 12:47 PM    [facility] [address] [place], [place], [address] Phone: [phone]   Fax:  [phone]  Name: Salene  Trey Bebee MRN: [id number] Date of Birth: [birth year]

## 2019-07-17 ENCOUNTER — Encounter: Payer: Medicare PPO | Admitting: Physical Therapy

## 2019-07-22 ENCOUNTER — Ambulatory Visit (INDEPENDENT_AMBULATORY_CARE_PROVIDER_SITE_OTHER): Payer: Medicare PPO | Admitting: Physical Therapy

## 2019-07-22 ENCOUNTER — Other Ambulatory Visit: Payer: Self-pay | Admitting: Orthopaedic Surgery

## 2019-07-22 ENCOUNTER — Encounter: Payer: Self-pay | Admitting: Physical Therapy

## 2019-07-22 ENCOUNTER — Other Ambulatory Visit: Payer: Self-pay

## 2019-07-22 ENCOUNTER — Telehealth: Payer: Self-pay | Admitting: Orthopaedic Surgery

## 2019-07-22 DIAGNOSIS — M25512 Pain in left shoulder: Secondary | ICD-10-CM

## 2019-07-22 DIAGNOSIS — R293 Abnormal posture: Secondary | ICD-10-CM

## 2019-07-22 DIAGNOSIS — R600 Localized edema: Secondary | ICD-10-CM | POA: Diagnosis not present

## 2019-07-22 DIAGNOSIS — M25612 Stiffness of left shoulder, not elsewhere classified: Secondary | ICD-10-CM

## 2019-07-22 MED ORDER — HYDROCODONE-ACETAMINOPHEN 5-325 MG PO TABS: 1.0000 | ORAL_TABLET | Freq: Three times a day (TID) | ORAL | 0 refills | Status: DC | PRN

## 2019-07-22 NOTE — Therapy (Signed)
[facility] Physical Therapy [address] [place], [place], [address] Phone: [phone]   Fax:  [phone]  Physical Therapy Treatment  Patient Details  Name: [name] MRN: [id number] Date of Birth: January 05, 1951 Referring Provider (PT): Jean Rosenthal, MD   Encounter Date: 07/22/2019  PT End of Session - 07/22/19 1059    Visit Number  5    Number of Visits  16    Date for PT Re-Evaluation  08/19/19    PT Start Time  2376    PT Stop Time  1057    PT Time Calculation (min)  39 min    Activity Tolerance  Patient tolerated treatment well    Behavior During Therapy  [facility] for tasks assessed/performed       Past Medical History:  Diagnosis Date  . Abscess in epidural space of L2-L5 lumbar spine 03/2006  . Anemia, iron deficiency   . Arthritis   . Breast cancer ([place]) 06/12/2017   right breast  . CAD (coronary artery disease), native coronary artery 03/30/2015  . Cataract   . Colon polyp    a. Multiple colonic polyps status post colonoscopy in October 2007, consistent with tubular adenoma, tubulovillous adenoma with no high-grade dysplasia or malignancy identified.   . Constipation   . Coronary artery disease    a. s/p CABG 06/2010: S-D2; S-PDA (at time of MV surgery).  . Depression   . Diabetes mellitus    type II  . Family history of breast cancer   . Family history of prostate cancer   . Genetic testing 09/25/2017   Multi-Cancer panel (83 genes) @ [facility] - No pathogenic mutations detected  . GERD (gastroesophageal reflux disease)   . Hx of transient ischemic attack (TIA)    a. See stroke section.  . Hyperlipidemia   . Hypertension   . Hypoxia    a. Has history of acute hypoxic respiratory failure in the setting of bronchitis/PNA or prior admissions.  [name] MRSA infection    a. History of recurrent skin infection and soft tissue abscesses, with MRSA positive in the past.  . 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 mitral valve -  s/p resection and MV repair 06/2010 Dr. [name].  . 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 Personal history of radiation therapy   . Sleep apnea    does not use CPAP  . Stenosis of middle cerebral artery    a. Distal R MCA.  . Stroke [facility])    a. 11/2009: mitral mass diagnosed at this time, also has distal R MCA stenosis, tx with coumadin. b. Readmitted 05/2010 with TIA symptoms - had not been taking Coumadin. s/p MV surgery 06/2010. Coumadin stopped 2013 after review of chart by Dr. [name] since mass was removed (stroke felt possibly related to this).     Past Surgical History:  Procedure Laterality Date  . BREAST LUMPECTOMY Right   . BREAST LUMPECTOMY WITH RADIOACTIVE SEED AND SENTINEL LYMPH NODE BIOPSY Right 07/20/2017   Procedure: RIGHT BREAST LUMPECTOMY WITH RADIOACTIVE SEED AND SENTINEL LYMPH NODE BIOPSY;  Surgeon: Alphonsa Overall, MD;  Location: [place];  Service: General;  Laterality: Right;  . CARDIAC CATHETERIZATION  04/03/2015   Procedure: Left Heart Cath and Cors/Grafts Angiography;  Surgeon: Peter M Martinique, MD;  Location: [place] CV LAB;  Service: Cardiovascular;;  . CORONARY ARTERY BYPASS GRAFT  1/12  . history of ankle fractures requiring surgery    . LUMBAR LAMINECTOMY/DECOMPRESSION MICRODISCECTOMY Left 04/15/2015   Procedure: LUMBAR  LAMINECTOMY/DECOMPRESSION MICRODISCECTOMY 1 LEVEL;  Surgeon: Newman Pies, MD;  Location: [place] NEURO ORS;  Service: Neurosurgery;  Laterality: Left;  Left L23 microdiskectomy  . MV repair and resection of mass  1/12  . RESECTION DISTAL CLAVICAL Left 05/16/2019   Procedure: RESECTION DISTAL CLAVICAL;  Surgeon: Mcarthur Rossetti, MD;  Location: [place];  Service: Orthopedics;  Laterality: Left;  . SHOULDER ARTHROSCOPY WITH ROTATOR CUFF REPAIR AND SUBACROMIAL DECOMPRESSION Left 05/16/2019   Procedure: SHOULDER ARTHROSCOPY WITH ROTATOR CUFF REPAIR AND SUBACROMIAL DECOMPRESSION;  Surgeon: Mcarthur Rossetti, MD;   Location: [place];  Service: Orthopedics;  Laterality: Left;  . TONSILLECTOMY  1971  . TOTAL ABDOMINAL HYSTERECTOMY  1978  . VESICOVAGINAL FISTULA CLOSURE W/ TAH      There were no vitals filed for this visit.  Subjective Assessment - 07/22/19 1021    Subjective  shoulder is sore today, "a little bit of pain"    Limitations  Lifting    Patient Stated Goals  regain use of Lt shoulder, improve motion and strength, improve pain    Currently in Pain?  Yes    Pain Score  5     Pain Location  Shoulder    Pain Orientation  Left    Pain Descriptors / Indicators  Sore    Pain Type  Acute pain;Surgical pain    Pain Onset  More than a month ago    Aggravating Factors   using LUE    Pain Relieving Factors  meds, ice                  Outpatient Re[facility]19/[facility]  Lymphedema Life Impact Scale Total Score  41.18 %           [facility] Adult PT Treatment/Exercise - 07/22/19 1022      Shoulder Exercises: Supine   Flexion  AAROM;20 reps;Both    Flexion Limitations  3# bar    Other Supine Exercises  chest press 3# bar x 20 reps      Shoulder Exercises: Standing   Row  Both;20 reps;Theraband    Theraband Level (Shoulder Row)  Level 4 (Blue)    Other Standing Exercises  wall ladder flexion x 10 reps      Shoulder Exercises: Therapy Ball   Flexion  Both;20 reps    Flexion Limitations  ball on wall      Shoulder Exercises: ROM/Strengthening   [place]  15 reps ea into flexion, then circles, then scaption      Manual Therapy   Passive ROM  Lt shoulder flexion/abduction/er/ir to tolerance               PT Short Term Goals - 06/24/19 1258      PT SHORT TERM GOAL #1   Title  independent with initial HEP    Status  New    Target Date  07/22/19      PT SHORT TERM GOAL #2   Title  Lt shoulder AROM improved by 15 degress each direction for improved function    Status  New    Target Date  07/22/19         PT Long Term Goals - 06/24/19 1259      PT LONG TERM GOAL #1   Title  independent with advanced HEP    Status  New    Target Date  08/19/19      PT LONG TERM GOAL #2  Title  improve Lt shoulder AROM to [facility] for improved function    Status  New    Target Date  08/19/19      PT LONG TERM GOAL #3   Title  report pain < 4/10 with activity for improved function    Status  New    Target Date  08/19/19      PT LONG TERM GOAL #4   Title  demonstrate at least 4/5 Lt shoulder strength for improved function    Status  New    Target Date  08/19/19      PT LONG TERM GOAL #5   Title  n/a            Plan - 07/22/19 1059    Clinical Impression Statement  Pt demonstrating improvement in ROM, but not measued today.  Will plan to measure next session.  Progressing well with PT.    Personal Factors and Comorbidities  Comorbidity 3+    Comorbidities  CVA, HTN, depression, DM, L2-5 abscess in epidural space, arthritis, hx breast cancer, CAD s/p CABG    Examination-Activity Limitations  Bathing;Lift;Bed Mobility;Reach Overhead;Self Feeding;Carry;Sleep;Hygiene/Grooming;Dressing    Examination-Participation Restrictions  Cleaning;Meal Prep    Stability/Clinical Decision Making  Evolving/Moderate complexity    Rehab Potential  Good    PT Frequency  2x / week    PT Duration  8 weeks    PT Treatment/Interventions  ADLs/Self Care Home Management;Cryotherapy;Electrical Stimulation;Ultrasound;Moist Heat;Functional mobility training;Therapeutic activities;Therapeutic exercise;Patient/family education;Neuromuscular re-education;Manual techniques;Vasopneumatic Device;Taping;Dry needling;Passive range of motion    PT Next Visit Plan  check STGs. progress ROM/strength    PT Home Exercise Plan  Access Code: [email or web address]    Consulted and Agree with Plan of Care  Patient       Patient will benefit from skilled therapeutic intervention in order to improve the following deficits and impairments:   Increased fascial restricitons, Pain, Postural dysfunction, Decreased mobility, Decreased activity tolerance, Decreased range of motion, Decreased strength, Impaired UE functional use  Visit Diagnosis: Acute pain of left shoulder  Stiffness of left shoulder, not elsewhere classified  Abnormal posture  Localized edema     Problem List Patient Active Problem List   Diagnosis Date Noted  . Spinal stenosis of lumbar region 05/21/2019  . Complete tear of left rotator cuff 05/16/2019  . Uncontrolled type 2 diabetes mellitus with hyperglycemia ([place]) 12/19/2018  . Microalbuminuria 04/08/2018  . Genetic testing 09/25/2017  . Family history of breast cancer   . Family history of prostate cancer   . Malignant neoplasm of upper-outer quadrant of right breast in female, estrogen receptor positive ([place]) 06/26/2017  . Lumbar herniated disc 04/15/2015  . Abnormal nuclear stress test 04/03/2015  . 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 CAD (coronary artery disease), native coronary artery 03/30/2015  . S/P mitral valve repair 11/16/2010  . Dyslipidemia 06/29/2010  . Diabetes ([place]) 09/29/2006  . ANEMIA-IRON DEFICIENCY 09/29/2006  . Essential hypertension 09/29/2006        Laureen Abrahams, PT, DPT 07/22/19 11:03 AM     [facility] Physical Therapy [address] [place], [place], [address] Phone: [phone]   Fax:  [phone]  Name: [name] MRN: [id number] Date of Birth: 02/05/1951

## 2019-07-22 NOTE — Telephone Encounter (Signed)
Patient called.  She is requesting a refill on her oxycodone.  Call back number: [phone]

## 2019-07-22 NOTE — Telephone Encounter (Signed)
Patient notified

## 2019-07-22 NOTE — Telephone Encounter (Signed)
Since it has now been over 2 months since her surgery, we need to not provide any more oxycodone at this standpoint.  We actually need to wean her to hydrocodone.  I will send in some hydrocodone.

## 2019-07-23 ENCOUNTER — Ambulatory Visit: Payer: Medicare PPO | Admitting: Internal Medicine

## 2019-07-24 ENCOUNTER — Other Ambulatory Visit: Payer: Self-pay

## 2019-07-24 ENCOUNTER — Encounter: Payer: Self-pay | Admitting: Physical Therapy

## 2019-07-24 ENCOUNTER — Ambulatory Visit (INDEPENDENT_AMBULATORY_CARE_PROVIDER_SITE_OTHER): Payer: Medicare PPO | Admitting: Physical Therapy

## 2019-07-24 DIAGNOSIS — M25512 Pain in left shoulder: Secondary | ICD-10-CM

## 2019-07-24 DIAGNOSIS — R293 Abnormal posture: Secondary | ICD-10-CM | POA: Diagnosis not present

## 2019-07-24 DIAGNOSIS — M25612 Stiffness of left shoulder, not elsewhere classified: Secondary | ICD-10-CM

## 2019-07-24 DIAGNOSIS — R600 Localized edema: Secondary | ICD-10-CM | POA: Diagnosis not present

## 2019-07-24 NOTE — Therapy (Signed)
[facility] Physical Therapy [address] [place], [place], [address] Phone: [phone]   Fax:  [phone]  Physical Therapy Treatment  Patient Details  Name: [name] MRN: [id number] Date of Birth: 12/02/1950 Referring Provider (PT): Jean Rosenthal, MD   Encounter Date: 07/24/2019  PT End of Session - 07/24/19 1053    Visit Number  6    Number of Visits  16    Date for PT Re-Evaluation  08/19/19    PT Start Time  2094    PT Stop Time  1103    PT Time Calculation (min)  48 min    Activity Tolerance  Patient tolerated treatment well    Behavior During Therapy  [facility] for tasks assessed/performed       Past Medical History:  Diagnosis Date  . Abscess in epidural space of L2-L5 lumbar spine 03/2006  . Anemia, iron deficiency   . Arthritis   . Breast cancer ([place]) 06/12/2017   right breast  . CAD (coronary artery disease), native coronary artery 03/30/2015  . Cataract   . Colon polyp    a. Multiple colonic polyps status post colonoscopy in October 2007, consistent with tubular adenoma, tubulovillous adenoma with no high-grade dysplasia or malignancy identified.   . Constipation   . Coronary artery disease    a. s/p CABG 06/2010: S-D2; S-PDA (at time of MV surgery).  . Depression   . Diabetes mellitus    type II  . Family history of breast cancer   . Family history of prostate cancer   . Genetic testing 09/25/2017   Multi-Cancer panel (83 genes) @ [facility] - No pathogenic mutations detected  . GERD (gastroesophageal reflux disease)   . Hx of transient ischemic attack (TIA)    a. See stroke section.  . Hyperlipidemia   . Hypertension   . Hypoxia    a. Has history of acute hypoxic respiratory failure in the setting of bronchitis/PNA or prior admissions.  [name] MRSA infection    a. History of recurrent skin infection and soft tissue abscesses, with MRSA positive in the past.  . 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 mitral valve -  s/p resection and MV repair 06/2010 Dr. [name].  . 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 Personal history of radiation therapy   . Sleep apnea    does not use CPAP  . Stenosis of middle cerebral artery    a. Distal R MCA.  . Stroke [facility])    a. 11/2009: mitral mass diagnosed at this time, also has distal R MCA stenosis, tx with coumadin. b. Readmitted 05/2010 with TIA symptoms - had not been taking Coumadin. s/p MV surgery 06/2010. Coumadin stopped 2013 after review of chart by Dr. [name] since mass was removed (stroke felt possibly related to this).     Past Surgical History:  Procedure Laterality Date  . BREAST LUMPECTOMY Right   . BREAST LUMPECTOMY WITH RADIOACTIVE SEED AND SENTINEL LYMPH NODE BIOPSY Right 07/20/2017   Procedure: RIGHT BREAST LUMPECTOMY WITH RADIOACTIVE SEED AND SENTINEL LYMPH NODE BIOPSY;  Surgeon: Alphonsa Overall, MD;  Location: [place];  Service: General;  Laterality: Right;  . CARDIAC CATHETERIZATION  04/03/2015   Procedure: Left Heart Cath and Cors/Grafts Angiography;  Surgeon: Peter M Martinique, MD;  Location: [place] CV LAB;  Service: Cardiovascular;;  . CORONARY ARTERY BYPASS GRAFT  1/12  . history of ankle fractures requiring surgery    . LUMBAR LAMINECTOMY/DECOMPRESSION MICRODISCECTOMY Left 04/15/2015   Procedure: LUMBAR  LAMINECTOMY/DECOMPRESSION MICRODISCECTOMY 1 LEVEL;  Surgeon: Newman Pies, MD;  Location: [place] [facility];  Service: Neurosurgery;  Laterality: Left;  Left L23 microdiskectomy  . MV repair and resection of mass  1/12  . RESECTION DISTAL CLAVICAL Left 05/16/2019   Procedure: RESECTION DISTAL CLAVICAL;  Surgeon: Mcarthur Rossetti, MD;  Location: [place];  Service: Orthopedics;  Laterality: Left;  . SHOULDER ARTHROSCOPY WITH ROTATOR CUFF REPAIR AND SUBACROMIAL DECOMPRESSION Left 05/16/2019   Procedure: SHOULDER ARTHROSCOPY WITH ROTATOR CUFF REPAIR AND SUBACROMIAL DECOMPRESSION;  Surgeon: Mcarthur Rossetti, MD;   Location: [place];  Service: Orthopedics;  Laterality: Left;  . TONSILLECTOMY  1971  . TOTAL ABDOMINAL HYSTERECTOMY  1978  . VESICOVAGINAL FISTULA CLOSURE W/ TAH      There were no vitals filed for this visit.  Subjective Assessment - 07/24/19 1019    Subjective  "I love my physical therapy."    Limitations  Lifting    Patient Stated Goals  regain use of Lt shoulder, improve motion and strength, improve pain    Currently in Pain?  No/denies    Pain Onset  More than a month ago         [facility] PT Assessment - 07/24/19 1025      Assessment   Medical Diagnosis  Lt RTC repair    Referring Provider (PT)  Jean Rosenthal, MD    Onset Date/Surgical Date  05/16/19    Hand Dominance  Left    Next MD Visit  08/05/19      AROM   Left Shoulder Flexion  145 Degrees   sitting   Left Shoulder ABduction  134 Degrees   sitting   Left Shoulder Internal Rotation  82 Degrees    Left Shoulder External Rotation  65 Degrees              Outpatient Rehab from [facility]19/2017 [facility]  Lymphedema Life Impact Scale Total Score  41.18 %           [facility] Adult PT Treatment/Exercise - 07/24/19 1020      Shoulder Exercises: Supine   External Rotation  Left;AAROM;15 reps      Shoulder Exercises: Standing   Row  Both;15 reps;Theraband    Theraband Level (Shoulder Row)  Level 2 (Red)   for HEP review     Shoulder Exercises: Pulleys   Flexion  2 minutes    Scaption  2 minutes      Vasopneumatic   Number Minutes Vasopneumatic   10 minutes    Vasopnuematic Location   Shoulder    Vasopneumatic Pressure  Low      Manual Therapy   Passive ROM  Lt shoulder flexion/abduction/er/ir to tolerance               PT Short Term Goals - 07/24/19 1054      PT SHORT TERM GOAL #1   Title  independent with initial HEP    Status  Partially Met    Target Date  07/22/19      PT SHORT TERM GOAL #2   Title  Lt shoulder AROM  improved by 15 degress each direction for improved function    Status  Achieved    Target Date  07/22/19        PT Long Term Goals - 06/24/19 1259      PT LONG TERM GOAL #1   Title  independent with advanced HEP    Status  New  Target Date  08/19/19      PT LONG TERM GOAL #2   Title  improve Lt shoulder AROM to [facility] for improved function    Status  New    Target Date  08/19/19      PT LONG TERM GOAL #3   Title  report pain < 4/10 with activity for improved function    Status  New    Target Date  08/19/19      PT LONG TERM GOAL #4   Title  demonstrate at least 4/5 Lt shoulder strength for improved function    Status  New    Target Date  08/19/19      PT LONG TERM GOAL #5   Title  n/a            Plan - 07/24/19 1054    Clinical Impression Statement  Pt has met STG #2 and partially met STG #1 due to inconsistent compliance with exercises.  Overall progresing well with PT at this time.  Tolerating strengthening exercises well.    Personal Factors and Comorbidities  Comorbidity 3+    Comorbidities  CVA, HTN, depression, DM, L2-5 abscess in epidural space, arthritis, hx breast cancer, CAD s/p CABG    Examination-Activity Limitations  Bathing;Lift;Bed Mobility;Reach Overhead;Self Feeding;Carry;Sleep;Hygiene/Grooming;Dressing    Examination-Participation Restrictions  Cleaning;Meal Prep    Stability/Clinical Decision Making  Evolving/Moderate complexity    Rehab Potential  Good    PT Frequency  2x / week    PT Duration  8 weeks    PT Treatment/Interventions  ADLs/Self Care Home Management;Cryotherapy;Electrical Stimulation;Ultrasound;Moist Heat;Functional mobility training;Therapeutic activities;Therapeutic exercise;Patient/family education;Neuromuscular re-education;Manual techniques;Vasopneumatic Device;Taping;Dry needling;Passive range of motion    PT Next Visit Plan  progress ROM/strength    PT Home Exercise Plan  Access Code: [email or web address]    Consulted and Agree with Plan  of Care  Patient       Patient will benefit from skilled therapeutic intervention in order to improve the following deficits and impairments:  Increased fascial restricitons, Pain, Postural dysfunction, Decreased mobility, Decreased activity tolerance, Decreased range of motion, Decreased strength, Impaired UE functional use  Visit Diagnosis: Acute pain of left shoulder  Stiffness of left shoulder, not elsewhere classified  Abnormal posture  Localized edema     Problem List Patient Active Problem List   Diagnosis Date Noted  . Spinal stenosis of lumbar region 05/21/2019  . Complete tear of left rotator cuff 05/16/2019  . Uncontrolled type 2 diabetes mellitus with hyperglycemia ([address]) 12/19/2018  . Microalbuminuria 04/08/2018  . Genetic testing 09/25/2017  . Family history of breast cancer   . Family history of prostate cancer   . Malignant neoplasm of upper-outer quadrant of right breast in female, estrogen receptor positive ([place]) 06/26/2017  . Lumbar herniated disc 04/15/2015  . Abnormal nuclear stress test 04/03/2015  . 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 CAD (coronary artery disease), native coronary artery 03/30/2015  . S/P mitral valve repair 11/16/2010  . Dyslipidemia 06/29/2010  . Diabetes ([place]) 09/29/2006  . ANEMIA-IRON DEFICIENCY 09/29/2006  . Essential hypertension 09/29/2006      Laureen Abrahams, PT, DPT 07/24/19 10:55 AM     [facility] Physical Therapy [address] [place], [place], [address] Phone: [phone]   Fax:  [phone]  Name: [name] MRN: [id number] Date of Birth: 1950-10-16

## 2019-07-29 ENCOUNTER — Other Ambulatory Visit: Payer: Self-pay

## 2019-07-29 ENCOUNTER — Encounter: Payer: Medicare PPO | Admitting: Physical Therapy

## 2019-07-29 ENCOUNTER — Ambulatory Visit: Payer: Medicare PPO | Attending: Internal Medicine | Admitting: Internal Medicine

## 2019-07-29 DIAGNOSIS — C50411 Malignant neoplasm of upper-outer quadrant of right female breast: Secondary | ICD-10-CM

## 2019-07-29 DIAGNOSIS — M48061 Spinal stenosis, lumbar region without neurogenic claudication: Secondary | ICD-10-CM

## 2019-07-29 DIAGNOSIS — D509 Iron deficiency anemia, unspecified: Secondary | ICD-10-CM

## 2019-07-29 DIAGNOSIS — Z170 Estrogen receptor positive status [ER+]: Secondary | ICD-10-CM

## 2019-07-29 DIAGNOSIS — Z1231 Encounter for screening mammogram for malignant neoplasm of breast: Secondary | ICD-10-CM

## 2019-07-29 DIAGNOSIS — I10 Essential (primary) hypertension: Secondary | ICD-10-CM

## 2019-07-29 DIAGNOSIS — I25810 Atherosclerosis of coronary artery bypass graft(s) without angina pectoris: Secondary | ICD-10-CM

## 2019-07-29 DIAGNOSIS — E669 Obesity, unspecified: Secondary | ICD-10-CM | POA: Diagnosis not present

## 2019-07-29 DIAGNOSIS — E1159 Type 2 diabetes mellitus with other circulatory complications: Secondary | ICD-10-CM | POA: Diagnosis not present

## 2019-07-29 MED ORDER — TRAZODONE HCL 50 MG PO TABS: 50.0000 mg | ORAL_TABLET | Freq: Every evening | ORAL | 0 refills | Status: DC | PRN

## 2019-07-29 MED ORDER — FERROUS SULFATE 325 (65 FE) MG PO TABS: ORAL_TABLET | ORAL | 1 refills | Status: DC

## 2019-07-29 NOTE — Progress Notes (Signed)
Virtual Visit via Telephone Note Due to current restrictions/limitations of in-office visits due to the COVID-19 pandemic, this scheduled clinical appointment was converted to a telehealth visit  I connected with [name] on 07/29/19 at 1:42 p.m by telephone and verified that I am speaking with the correct person using two identifiers. I am in my office.  The patient is at home.  Only the patient and myself participated in this encounter.  I discussed the limitations, risks, security and privacy concerns of performing an evaluation and management service by telephone and the availability of in person appointments. I also discussed with the patient that there may be a patient responsible charge related to this service. The patient expressed understanding and agreed to proceed.   History of Present Illness: Pt with hx of HTN, DM, HL, depression, chronic back pain, RT breast CA (intraductal CA, ER/PR+, lumpectomy and XRT- completed 08/2017), CKD stage 3, CVA 2011 (residual weakness on LT side), CAD s/p CABG andMVR/resection of fibroelastoma, IDA.   Since last visit she had rotator cuff repair on LT shoulder.  Doing well.  Doing to P.T 1-2 x a wk  Chronic LBP: had MRI which showed: IMPRESSION: 1. Progressive moderately severe spinal stenosis at L3-4 with slight compression of both lateral recesses which could affect the L4 nerve roots. 2. New soft disc protrusion into the left neural foramen at L2-3 which could affect the left L2 nerve lateral to the neural foramen. New moderate spinal stenosis at L2-3. 3. Moderate left facet arthritis at L4-5.  Saw a Psychologist, sport and exercise.  Told to lose some wgh and wants to wait until she finishes with P.T for shoulder before sending her for therapy on her back.  She said he wants to explore non-surgical options first  DM/Obesity:  Checks BS once a day in the mornings.  Range 98-118 Doing well with eating habits.  Eating less sweets "but sometimes I get  cravings."  Incorporates fruits and veggies Compliant  With Tradjenta, Metformin and Glipizide Current wgh is 225 lbs.  Down 13 lbs since last visit  HYPERTENSION/CAD/HL Currently taking: see medication list Med Adherence: [x] Yes    [] No Medication side effects: [] Yes    [x] No Adherence with salt restriction: [x] Yes    [] No Home Monitoring?: [x] Yes    [] No Monitoring Frequency:  Not often Home BP results range: [] 132/70 was latest reading SOB? [] Yes    [x] No Chest Pain?: [] Yes    [x] No Leg swelling?: [] Yes    [x] No.  Some swelling in knees Headaches?: [] Yes    [x] No Dizziness? [] Yes    [x] No Comments:   Needing Rf on iron and Trazodone.  Trazodone she takes to help with sleep.  History of breast CA: She is overdue for mammogram with [facility]. Current Outpatient Medications  Medication Instructions  . ACCU-CHEK SOFTCLIX LANCETS lancets Use as instructed  . acetaminophen (ACETAMINOPHEN EXTRA STRENGTH) 500 mg, Oral, Every 8 hours PRN  . albuterol (PROVENTIL HFA;VENTOLIN HFA) 108 (90 Base) MCG/ACT inhaler 2 puffs, Inhalation, Every 6 hours PRN  . amLODipine (NORVASC) 10 mg, Oral, Daily  . aspirin EC 81 mg, Oral, Daily  . atorvastatin (LIPITOR) 60 mg, Oral, Daily  . baclofen (LIORESAL) 5-10 mg, Oral, 3 times daily PRN  . Blood Glucose Monitoring Suppl (ACCU-CHEK AVIVA PLUS) w/Device KIT Use as directed  . buPROPion (WELLBUTRIN XL) 150 mg, Oral, 2 times daily  . carvedilol (  COREG) 6.25 mg, Oral, 2 times daily with meals  . diclofenac sodium (VOLTAREN) 2 g, Topical, 4 times daily PRN  . FEROSUL 325 (65 Fe) MG tablet TAKE 1 TABLET (325 MG TOTAL) BY MOUTH DAILY WITH BREAKFAST.  [name] glipiZIDE (GLUCOTROL) 5 MG tablet TAKE 1 TABLET DAILY BEFORE BREAKFAST  . glucose blood (ACCU-CHEK AVIVA PLUS) test strip Use as instructed  . hydrochlorothiazide (HYDRODIURIL) 25 mg, Oral, Daily  . HYDROcodone-acetaminophen (NORCO/VICODIN) 5-325 MG tablet 1-2 tablets, Oral, 3 times daily PRN  .  letrozole (FEMARA) 2.5 mg, Oral, Daily  . linagliptin (TRADJENTA) 5 mg, Oral, Daily  . losartan (COZAAR) 100 MG tablet TAKE 1 TABLET EVERY DAY  . metFORMIN (GLUCOPHAGE) 1000 MG tablet TAKE 1 TABLET TWICE DAILY WITH MEALS  . methocarbamol (ROBAXIN) 500 mg, Oral, Every 8 hours PRN, /back pain  . pantoprazole (PROTONIX) 40 MG tablet TAKE 1 TABLET EVERY DAY  . polyethylene glycol (MIRALAX / GLYCOLAX) 17 g, Oral, Daily PRN  . traZODone (DESYREL) 50 mg, Oral, At bedtime PRN, for sleep    Observations/Objective: Results for orders placed or performed during the hospital encounter of 25/42/70  Basic metabolic panel  Result Value Ref Range   Sodium 140 135 - 145 mmol/L   Potassium 5.4 (H) 3.5 - 5.1 mmol/L   Chloride 103 98 - 111 mmol/L   CO2 25 22 - 32 mmol/L   Glucose, Bld 159 (H) 70 - 99 mg/dL   BUN 16 8 - 23 mg/dL   Creatinine, Ser 1.09 (H) 0.44 - 1.00 mg/dL   Calcium 9.4 8.9 - 10.3 mg/dL   GFR calc non Af Amer 52 (L) >60 mL/min   GFR calc Af Amer 60 (L) >60 mL/min   Anion gap 12 5 - 15  Glucose, capillary  Result Value Ref Range   Glucose-Capillary 120 (H) 70 - 99 mg/dL  Glucose, capillary  Result Value Ref Range   Glucose-Capillary 101 (H) 70 - 99 mg/dL   Lab Results  Component Value Date   HGBA1C 7.3 (A) 04/19/2019   Lab Results  Component Value Date   WBC 5.9 04/19/2019   HGB 11.6 04/19/2019   HCT 36.3 04/19/2019   MCV 82 04/19/2019   PLT 279 04/19/2019     Assessment and Plan: 1. Controlled type 2 diabetes mellitus with other circulatory complication, without long-term current use of insulin ([place]) Reported blood sugar readings are at goal.  She will continue current medications.  Encouraged her to continue healthy eating habits.  Commended her on weight loss so far. - Hemoglobin A1c; Future - Hepatic Function Panel; Future - Lipid panel; Future  2. Obesity (BMI 30-39.9) See #1 above  3. Essential hypertension Close to goal.  She will continue current  medications that include Cozaar, HCTZ and carvedilol  4. Coronary artery disease involving coronary bypass graft of native heart without angina pectoris Clinically stable.  Continue aspirin, Lipitor and carvedilol - Lipid panel; Future  5. Spinal stenosis of lumbar region, unspecified whether neurogenic claudication present Followed by orthopedics/spine specialist.  Patient tells me that the plan is for her to do some physical therapy.  She is currently receiving Norco through the orthopedics  6. Iron deficiency anemia, unspecified iron deficiency anemia type - ferrous sulfate (FEROSUL) 325 (65 FE) MG tablet; TAKE 1 TABLET (325 MG TOTAL) BY MOUTH DAILY WITH BREAKFAST.  Dispense: 90 tablet; Refill: 1  7. Malignant neoplasm of upper-outer quadrant of right breast in female, estrogen receptor positive ([place]) - MM DIAG BREAST  TOMO BILATERAL; Future  8. Encounter for screening mammogram for malignant neoplasm of breast - MM DIAG BREAST TOMO BILATERAL; Future   Follow Up Instructions: 3 mths   I discussed the assessment and treatment plan with the patient. The patient was provided an opportunity to ask questions and all were answered. The patient agreed with the plan and demonstrated an understanding of the instructions.   The patient was advised to call back or seek an in-person evaluation if the symptoms worsen or if the condition fails to improve as anticipated.  I provided 18 minutes of non-face-to-face time during this encounter.   Karle Plumber, MD

## 2019-07-29 NOTE — Progress Notes (Signed)
Pt states her blood sugar this morning wads 118

## 2019-07-31 ENCOUNTER — Ambulatory Visit (INDEPENDENT_AMBULATORY_CARE_PROVIDER_SITE_OTHER): Payer: Medicare PPO | Admitting: Physical Therapy

## 2019-07-31 ENCOUNTER — Other Ambulatory Visit: Payer: Self-pay

## 2019-07-31 ENCOUNTER — Encounter: Payer: Self-pay | Admitting: Physical Therapy

## 2019-07-31 VITALS — BP 171/91

## 2019-07-31 DIAGNOSIS — R293 Abnormal posture: Secondary | ICD-10-CM | POA: Diagnosis not present

## 2019-07-31 DIAGNOSIS — M25612 Stiffness of left shoulder, not elsewhere classified: Secondary | ICD-10-CM | POA: Diagnosis not present

## 2019-07-31 DIAGNOSIS — M25512 Pain in left shoulder: Secondary | ICD-10-CM

## 2019-07-31 DIAGNOSIS — R600 Localized edema: Secondary | ICD-10-CM | POA: Diagnosis not present

## 2019-07-31 NOTE — Therapy (Signed)
[facility] [address] [place], [place], [address] Phone: [phone]   Fax:  [phone]  Physical Therapy Treatment  Patient Details  Name: [name] MRN: [id number] Date of Birth: 10/04/1950 Referring Provider (PT): Jean Rosenthal, MD   Encounter Date: 07/31/2019  PT End of Session - 07/31/19 1203    Visit Number  7    Number of Visits  16    Date for PT Re-Evaluation  08/19/19    PT Start Time  9038    PT Stop Time  1057    PT Time Calculation (min)  42 min    Activity Tolerance  Patient tolerated treatment well    Behavior During Therapy  [facility] for tasks assessed/performed       Past Medical History:  Diagnosis Date  . Abscess in epidural space of L2-L5 lumbar spine 03/2006  . Anemia, iron deficiency   . Arthritis   . Breast cancer ([place]) 06/12/2017   right breast  . CAD (coronary artery disease), native coronary artery 03/30/2015  . Cataract   . Colon polyp    a. Multiple colonic polyps status post colonoscopy in October 2007, consistent with tubular adenoma, tubulovillous adenoma with no high-grade dysplasia or malignancy identified.   . Constipation   . Coronary artery disease    a. s/p CABG 06/2010: S-D2; S-PDA (at time of MV surgery).  . Depression   . Diabetes mellitus    type II  . Family history of breast cancer   . Family history of prostate cancer   . Genetic testing 09/25/2017   Multi-Cancer panel (83 genes) @ [facility] - No pathogenic mutations detected  . GERD (gastroesophageal reflux disease)   . Hx of transient ischemic attack (TIA)    a. See stroke section.  . Hyperlipidemia   . Hypertension   . Hypoxia    a. Has history of acute hypoxic respiratory failure in the setting of bronchitis/PNA or prior admissions.  [name] MRSA infection    a. History of recurrent skin infection and soft tissue abscesses, with MRSA positive in the past.  . 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 mitral valve -  s/p resection and MV repair 06/2010 Dr. [name].  . 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 Personal history of radiation therapy   . Sleep apnea    does not use CPAP  . Stenosis of middle cerebral artery    a. Distal R MCA.  . Stroke [facility])    a. 11/2009: mitral mass diagnosed at this time, also has distal R MCA stenosis, tx with coumadin. b. Readmitted 05/2010 with TIA symptoms - had not been taking Coumadin. s/p MV surgery 06/2010. Coumadin stopped 2013 after review of chart by Dr. [name] since mass was removed (stroke felt possibly related to this).     Past Surgical History:  Procedure Laterality Date  . BREAST LUMPECTOMY Right   . BREAST LUMPECTOMY WITH RADIOACTIVE SEED AND SENTINEL LYMPH NODE BIOPSY Right 07/20/2017   Procedure: RIGHT BREAST LUMPECTOMY WITH RADIOACTIVE SEED AND SENTINEL LYMPH NODE BIOPSY;  Surgeon: Alphonsa Overall, MD;  Location: [place];  Service: General;  Laterality: Right;  . CARDIAC CATHETERIZATION  04/03/2015   Procedure: Left Heart Cath and Cors/Grafts Angiography;  Surgeon: Peter M Martinique, MD;  Location: [place] CV LAB;  Service: Cardiovascular;;  . CORONARY ARTERY BYPASS GRAFT  1/12  . history of ankle fractures requiring surgery    . LUMBAR LAMINECTOMY/DECOMPRESSION MICRODISCECTOMY Left 04/15/2015   Procedure: LUMBAR  LAMINECTOMY/DECOMPRESSION MICRODISCECTOMY 1 LEVEL;  Surgeon: Newman Pies, MD;  Location: [place] [facility];  Service: Neurosurgery;  Laterality: Left;  Left L23 microdiskectomy  . MV repair and resection of mass  1/12  . RESECTION DISTAL CLAVICAL Left 05/16/2019   Procedure: RESECTION DISTAL CLAVICAL;  Surgeon: Mcarthur Rossetti, MD;  Location: [place];  Service: Orthopedics;  Laterality: Left;  . SHOULDER ARTHROSCOPY WITH ROTATOR CUFF REPAIR AND SUBACROMIAL DECOMPRESSION Left 05/16/2019   Procedure: SHOULDER ARTHROSCOPY WITH ROTATOR CUFF REPAIR AND SUBACROMIAL DECOMPRESSION;  Surgeon: Mcarthur Rossetti, MD;   Location: [place];  Service: Orthopedics;  Laterality: Left;  . TONSILLECTOMY  1971  . TOTAL ABDOMINAL HYSTERECTOMY  1978  . VESICOVAGINAL FISTULA CLOSURE W/ TAH      Vitals:   07/31/19 1052  BP: (!) 171/91    Subjective Assessment - 07/31/19 1019    Subjective  doing well; had a death in the family this past week so she's had a hard time recently    Limitations  Lifting    Patient Stated Goals  regain use of Lt shoulder, improve motion and strength, improve pain    Currently in Pain?  No/denies    Pain Onset  More than a month ago         [facility] PT Assessment - 07/31/19 1025      Assessment   Medical Diagnosis  Lt RTC repair    Referring Provider (PT)  Jean Rosenthal, MD    Onset Date/Surgical Date  05/16/19    Hand Dominance  Left    Next MD Visit  08/05/19      AROM   Right Shoulder Internal Rotation  --   FIR to T6   Left Shoulder Flexion  140 Degrees   seated   Left Shoulder ABduction  158 Degrees   seated   Left Shoulder Internal Rotation  --   FIR to T10     Strength   Strength Assessment Site  Shoulder    Right/Left Shoulder  Left    Left Shoulder Flexion  3+/5    Left Shoulder ABduction  3+/5    Left Shoulder Internal Rotation  4+/5    Left Shoulder External Rotation  4/5              [facility]patient [facility]19/[facility]  Lymphedema Life Impact Scale Total Score  41.18 %           [facility] Adult PT Treatment/Exercise - 07/31/19 1020      Shoulder Exercises: Standing   Flexion  AAROM;Left;20 reps    ABduction  AAROM;Left;10 reps   scaption   Row  Both;15 reps;Theraband    Theraband Level (Shoulder Row)  Level 2 (Red)    Other Standing Exercises  wall ladder flexion x 10 reps      Shoulder Exercises: Pulleys   Flexion  2 minutes    Scaption  2 minutes               PT Short Term Goals - 07/24/19 1054      PT SHORT TERM GOAL #1   Title  independent with  initial HEP    Status  Partially Met    Target Date  07/22/19      PT SHORT TERM GOAL #2   Title  Lt shoulder AROM improved by 15 degress each direction for improved function    Status  Achieved    Target Date  07/22/19        PT Long Term Goals - 06/24/19 1259      PT LONG TERM GOAL #1   Title  independent with advanced HEP    Status  New    Target Date  08/19/19      PT LONG TERM GOAL #2   Title  improve Lt shoulder AROM to [facility] for improved function    Status  New    Target Date  08/19/19      PT LONG TERM GOAL #3   Title  report pain < 4/10 with activity for improved function    Status  New    Target Date  08/19/19      PT LONG TERM GOAL #4   Title  demonstrate at least 4/5 Lt shoulder strength for improved function    Status  New    Target Date  08/19/19      PT LONG TERM GOAL #5   Title  n/a            Plan - 07/31/19 1203    Clinical Impression Statement  Pt has difficulty with standing active shoulder ROM repetitions, needing AA for continued movement.  After performing standing exercises pt reported feeling very fatigued.  Further questioning and pt reported she had not eaten today and BP was elevated this morning, but had not taken her meds due to not eating.  Glucose 84, and BP noted above and after seated rest and some goldfish crackers pt demonstrated improvement in symptoms.  Progressing slowly with PT with strengtheing.    Personal Factors and Comorbidities  Comorbidity 3+    Comorbidities  CVA, HTN, depression, DM, L2-5 abscess in epidural space, arthritis, hx breast cancer, CAD s/p CABG    Examination-Activity Limitations  Bathing;Lift;Bed Mobility;Reach Overhead;Self Feeding;Carry;Sleep;Hygiene/Grooming;Dressing    Examination-Participation Restrictions  Cleaning;Meal Prep    Stability/Clinical Decision Making  Evolving/Moderate complexity    Rehab Potential  Good    PT Frequency  2x / week    PT Duration  8 weeks    PT Treatment/Interventions   ADLs/Self Care Home Management;Cryotherapy;Electrical Stimulation;Ultrasound;Moist Heat;Functional mobility training;Therapeutic activities;Therapeutic exercise;Patient/family education;Neuromuscular re-education;Manual techniques;Vasopneumatic Device;Taping;Dry needling;Passive range of motion    PT Next Visit Plan  progress ROM/strength    PT Home Exercise Plan  Access Code: [email or web address]    Consulted and Agree with Plan of Care  Patient       Patient will benefit from skilled therapeutic intervention in order to improve the following deficits and impairments:  Increased fascial restricitons, Pain, Postural dysfunction, Decreased mobility, Decreased activity tolerance, Decreased range of motion, Decreased strength, Impaired UE functional use  Visit Diagnosis: Acute pain of left shoulder  Stiffness of left shoulder, not elsewhere classified  Abnormal posture  Localized edema     Problem List Patient Active Problem List   Diagnosis Date Noted  . Spinal stenosis of lumbar region 05/21/2019  . Complete tear of left rotator cuff 05/16/2019  . Uncontrolled type 2 diabetes mellitus with hyperglycemia ([place]) 12/19/2018  . Microalbuminuria 04/08/2018  . Genetic testing 09/25/2017  . Family history of breast cancer   . Family history of prostate cancer   . Malignant neoplasm of upper-outer quadrant of right breast in female, estrogen receptor positive ([place]) 06/26/2017  . Lumbar herniated disc 04/15/2015  . Abnormal nuclear stress test 04/03/2015  . 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 CAD (coronary artery disease), native coronary artery 03/30/2015  . S/P mitral valve repair 11/16/2010  .  Dyslipidemia 06/29/2010  . Diabetes ([place]) 09/29/2006  . ANEMIA-IRON DEFICIENCY 09/29/2006  . Essential hypertension 09/29/2006       Laureen Abrahams, PT, DPT 07/31/19 12:08 PM     [facility] [address] [place], [place], [address] Phone:  [phone]   Fax:  [phone]  Name: [name] MRN: [id number] Date of Birth: [birth year]

## 2019-08-05 ENCOUNTER — Encounter: Payer: Self-pay | Admitting: Orthopaedic Surgery

## 2019-08-05 ENCOUNTER — Ambulatory Visit (INDEPENDENT_AMBULATORY_CARE_PROVIDER_SITE_OTHER): Payer: Medicare PPO | Admitting: Orthopaedic Surgery

## 2019-08-05 ENCOUNTER — Other Ambulatory Visit: Payer: Self-pay

## 2019-08-05 DIAGNOSIS — Z9889 Other specified postprocedural states: Secondary | ICD-10-CM

## 2019-08-05 NOTE — Progress Notes (Signed)
The patient comes in today now 11 weeks out from a rotator cuff repair of her left shoulder.  This was done arthroscopically.  She is 69 years old.  She reports good progress with therapy.  On exam she has much improved abduction and overhead motion of her left shoulder.  Her strength is improving as well.  She is still using some of her deltoids to abduct her shoulder which is to be expected given her age and given the extent of her repair.  Overall though she is making progress.  She will continue therapy.  At this point I do not need to see her back for 3 months.  At that point we will consider a disability rating.  She is reporting right knee pain.  If she is having issues when she comes to see me in 3 months with her right knee we can always obtain x-rays of her right knee.  Right now we still want to work on just addressing her left shoulder to get her through the recovery process from surgery.

## 2019-08-09 ENCOUNTER — Encounter: Payer: Medicare PPO | Admitting: Physical Therapy

## 2019-08-14 ENCOUNTER — Other Ambulatory Visit: Payer: Self-pay

## 2019-08-14 ENCOUNTER — Ambulatory Visit (INDEPENDENT_AMBULATORY_CARE_PROVIDER_SITE_OTHER): Payer: Medicare PPO | Admitting: Physical Therapy

## 2019-08-14 ENCOUNTER — Telehealth: Payer: Self-pay | Admitting: Orthopaedic Surgery

## 2019-08-14 ENCOUNTER — Encounter: Payer: Self-pay | Admitting: Physical Therapy

## 2019-08-14 ENCOUNTER — Other Ambulatory Visit: Payer: Self-pay | Admitting: Orthopaedic Surgery

## 2019-08-14 DIAGNOSIS — R293 Abnormal posture: Secondary | ICD-10-CM

## 2019-08-14 DIAGNOSIS — M25612 Stiffness of left shoulder, not elsewhere classified: Secondary | ICD-10-CM

## 2019-08-14 DIAGNOSIS — M25512 Pain in left shoulder: Secondary | ICD-10-CM

## 2019-08-14 DIAGNOSIS — R600 Localized edema: Secondary | ICD-10-CM | POA: Diagnosis not present

## 2019-08-14 MED ORDER — HYDROCODONE-ACETAMINOPHEN 5-325 MG PO TABS: 1.0000 | ORAL_TABLET | Freq: Three times a day (TID) | ORAL | 0 refills | Status: DC | PRN

## 2019-08-14 NOTE — Telephone Encounter (Signed)
Patient requesting a refill on hydrocodone. Please send to pharmacy on file. Patient phone number is [phone].

## 2019-08-14 NOTE — Telephone Encounter (Signed)
Please advise 

## 2019-08-14 NOTE — Therapy (Signed)
[facility] [address] [place], [place], [address] Phone: [phone]   Fax:  [phone]  Physical Therapy Treatment  Patient Details  Name: [name] MRN: [id number] Date of Birth: [birth year] Referring Provider (PT): Jean Rosenthal, MD   Encounter Date: 08/14/2019  PT End of Session - 08/14/19 1218    Visit Number  8    Number of Visits  16    Date for PT Re-Evaluation  09/02/19   extended due to missed weeks   PT Start Time  1140    PT Stop Time  1224    PT Time Calculation (min)  44 min    Activity Tolerance  Patient tolerated treatment well    Behavior During Therapy  [facility] for tasks assessed/performed       Past Medical History:  Diagnosis Date  . Abscess in epidural space of L2-L5 lumbar spine 03/2006  . Anemia, iron deficiency   . Arthritis   . Breast cancer ([place]) 06/12/2017   right breast  . CAD (coronary artery disease), native coronary artery 03/30/2015  . Cataract   . Colon polyp    a. Multiple colonic polyps status post colonoscopy in October 2007, consistent with tubular adenoma, tubulovillous adenoma with no high-grade dysplasia or malignancy identified.   . Constipation   . Coronary artery disease    a. s/p CABG 06/2010: S-D2; S-PDA (at time of MV surgery).  . Depression   . Diabetes mellitus    type II  . Family history of breast cancer   . Family history of prostate cancer   . Genetic testing 09/25/2017   Multi-Cancer panel (83 genes) @ [facility] - No pathogenic mutations detected  . GERD (gastroesophageal reflux disease)   . Hx of transient ischemic attack (TIA)    a. See stroke section.  . Hyperlipidemia   . Hypertension   . Hypoxia    a. Has history of acute hypoxic respiratory failure in the setting of bronchitis/PNA or prior admissions.  [name] MRSA infection    a. History of recurrent skin infection and soft tissue abscesses, with MRSA positive in the past.  . 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 mitral valve - s/p resection and MV repair 06/2010 Dr. [name].  . 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 Personal history of radiation therapy   . Sleep apnea    does not use CPAP  . Stenosis of middle cerebral artery    a. Distal R MCA.  . Stroke [facility])    a. 11/2009: mitral mass diagnosed at this time, also has distal R MCA stenosis, tx with coumadin. b. Readmitted 05/2010 with TIA symptoms - had not been taking Coumadin. s/p MV surgery 06/2010. Coumadin stopped 2013 after review of chart by Dr. [name] since mass was removed (stroke felt possibly related to this).     Past Surgical History:  Procedure Laterality Date  . BREAST LUMPECTOMY Right   . BREAST LUMPECTOMY WITH RADIOACTIVE SEED AND SENTINEL LYMPH NODE BIOPSY Right 07/20/2017   Procedure: RIGHT BREAST LUMPECTOMY WITH RADIOACTIVE SEED AND SENTINEL LYMPH NODE BIOPSY;  Surgeon: Alphonsa Overall, MD;  Location: [place];  Service: General;  Laterality: Right;  . CARDIAC CATHETERIZATION  04/03/2015   Procedure: Left Heart Cath and Cors/Grafts Angiography;  Surgeon: Peter M Martinique, MD;  Location: [place] [facility];  Service: Cardiovascular;;  . CORONARY ARTERY BYPASS GRAFT  1/12  . history of ankle fractures requiring surgery    . LUMBAR LAMINECTOMY/DECOMPRESSION MICRODISCECTOMY  Left 04/15/2015   Procedure: LUMBAR LAMINECTOMY/DECOMPRESSION MICRODISCECTOMY 1 LEVEL;  Surgeon: Newman Pies, MD;  Location: [place] [facility];  Service: Neurosurgery;  Laterality: Left;  Left L23 microdiskectomy  . MV repair and resection of mass  1/12  . RESECTION DISTAL CLAVICAL Left 05/16/2019   Procedure: RESECTION DISTAL CLAVICAL;  Surgeon: Mcarthur Rossetti, MD;  Location: [place];  Service: Orthopedics;  Laterality: Left;  . SHOULDER ARTHROSCOPY WITH ROTATOR CUFF REPAIR AND SUBACROMIAL DECOMPRESSION Left 05/16/2019   Procedure: SHOULDER ARTHROSCOPY WITH ROTATOR CUFF REPAIR AND SUBACROMIAL DECOMPRESSION;  Surgeon:  Mcarthur Rossetti, MD;  Location: [place];  Service: Orthopedics;  Laterality: Left;  . TONSILLECTOMY  1971  . TOTAL ABDOMINAL HYSTERECTOMY  1978  . VESICOVAGINAL FISTULA CLOSURE W/ TAH      There were no vitals filed for this visit.  Subjective Assessment - 08/14/19 1143    Subjective  had 2nd COVID vaccine which made her feel pretty weak so she is just now returning to PT; shoulder is doing well, just shoulder is sore from weather.    Limitations  Lifting    Patient Stated Goals  regain use of Lt shoulder, improve motion and strength, improve pain    Currently in Pain?  Yes    Pain Score  7     Pain Location  Shoulder    Pain Orientation  Left    Pain Descriptors / Indicators  Sore    Pain Type  Acute pain;Surgical pain    Pain Onset  More than a month ago    Pain Frequency  Intermittent    Aggravating Factors   using LUE    Pain Relieving Factors  meds, ice         [facility] PT Assessment - 08/14/19 1152      Assessment   Medical Diagnosis  Lt RTC repair    Referring Provider (PT)  Jean Rosenthal, MD    Onset Date/Surgical Date  05/16/19    Hand Dominance  Left    Next MD Visit  11/05/19      AROM   Left Shoulder Flexion  150 Degrees   seated   Left Shoulder ABduction  164 Degrees   seated   Left Shoulder Internal Rotation  --   FIR to T 8/9   Left Shoulder External Rotation  --   FER limited to Lt ear     Strength   Left Shoulder Flexion  3+/5    Left Shoulder ABduction  3+/5              Outpatient Rehab from 10/19/2017 in [place]  Lymphedema Life Impact Scale Total Score  41.18 %           [facility] Adult PT Treatment/Exercise - 08/14/19 1145      Shoulder Exercises: Supine   Other Supine Exercises  Lt horizontal adduction AAROM with 1# bar x 20 reps      Shoulder Exercises: Seated   Other Seated Exercises  horizontal adduction x10 reps      Shoulder Exercises: Standing   Flexion   Left;15 reps    Shoulder Flexion Weight (lbs)  0.5#    ABduction  Left;15 reps    Shoulder ABduction Weight (lbs)  0.5#      Shoulder Exercises: Pulleys   Flexion  2 minutes    Scaption  2 minutes      Shoulder Exercises: ROM/Strengthening   [place]  20 reps flexion, circles -  ataxia present      Vasopneumatic   Number Minutes Vasopneumatic   10 minutes    Vasopnuematic Location   Shoulder    Vasopneumatic Pressure  Low    Vasopneumatic Temperature   34 deg      Manual Therapy   Passive ROM  Lt shoulder gentle horizontal adduction to tolerance               PT Short Term Goals - 07/24/19 1054      PT SHORT TERM GOAL #1   Title  independent with initial HEP    Status  Partially Met    Target Date  07/22/19      PT SHORT TERM GOAL #2   Title  Lt shoulder AROM improved by 15 degress each direction for improved function    Status  Achieved    Target Date  07/22/19        PT Long Term Goals - 08/14/19 1219      PT LONG TERM GOAL #1   Title  independent with advanced HEP    Status  New    Target Date  09/02/19      PT LONG TERM GOAL #2   Title  improve Lt shoulder AROM to [facility] for improved function    Status  Achieved    Target Date  09/02/19      PT LONG TERM GOAL #3   Title  report pain < 4/10 with activity for improved function    Status  New    Target Date  09/02/19      PT LONG TERM GOAL #4   Title  demonstrate at least 4/5 Lt shoulder strength for improved function    Status  New    Target Date  09/02/19      PT LONG TERM GOAL #5   Title  n/a            Plan - 08/14/19 1219    Clinical Impression Statement  Date extended due to missed weeks of PT, and overall pt with near full ROM, and only limited with strength at this time.  Anticipate pt will be ready for d/c next 2-3 weeks depending on progress.  Will continue to benefit from PT to maximize function.    Personal Factors and Comorbidities  Comorbidity 3+    Comorbidities  CVA, HTN,  depression, DM, L2-5 abscess in epidural space, arthritis, hx breast cancer, CAD s/p CABG    Examination-Activity Limitations  Bathing;Lift;Bed Mobility;Reach Overhead;Self Feeding;Carry;Sleep;Hygiene/Grooming;Dressing    Examination-Participation Restrictions  Cleaning;Meal Prep    Stability/Clinical Decision Making  Evolving/Moderate complexity    Rehab Potential  Good    PT Frequency  2x / week    PT Duration  8 weeks    PT Treatment/Interventions  ADLs/Self Care Home Management;Cryotherapy;Electrical Stimulation;Ultrasound;Moist Heat;Functional mobility training;Therapeutic activities;Therapeutic exercise;Patient/family education;Neuromuscular re-education;Manual techniques;Vasopneumatic Device;Taping;Dry needling;Passive range of motion    PT Next Visit Plan  progress ROM/strength    PT Home Exercise Plan  Access Code: [email or web address]    Consulted and Agree with Plan of Care  Patient       Patient will benefit from skilled therapeutic intervention in order to improve the following deficits and impairments:  Increased fascial restricitons, Pain, Postural dysfunction, Decreased mobility, Decreased activity tolerance, Decreased range of motion, Decreased strength, Impaired UE functional use  Visit Diagnosis: Acute pain of left shoulder  Stiffness of left shoulder, not elsewhere classified  Abnormal posture  Localized edema  Problem List Patient Active Problem List   Diagnosis Date Noted  . Spinal stenosis of lumbar region 05/21/2019  . Complete tear of left rotator cuff 05/16/2019  . Uncontrolled type 2 diabetes mellitus with hyperglycemia ([place]) 12/19/2018  . Microalbuminuria 04/08/2018  . Genetic testing 09/25/2017  . Family history of breast cancer   . Family history of prostate cancer   . Malignant neoplasm of upper-outer quadrant of right breast in female, estrogen receptor positive ([place]) 06/26/2017  . Lumbar herniated disc 04/15/2015  . Abnormal nuclear stress test  04/03/2015  . 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 CAD (coronary artery disease), native coronary artery 03/30/2015  . S/P mitral valve repair 11/16/2010  . Dyslipidemia 06/29/2010  . Diabetes ([place]) 09/29/2006  . ANEMIA-IRON DEFICIENCY 09/29/2006  . Essential hypertension 09/29/2006       Laureen Abrahams, PT, DPT 08/14/19 12:21 PM     [facility] [address] [place], [place], [address] Phone: [phone]   Fax:  [phone]  Name: [name] MRN: [id number] Date of Birth: [birth year]

## 2019-08-14 NOTE — Telephone Encounter (Signed)
I will refill this 1 more time.  It is now over 3 months out since her surgery so we need to have her off of narcotics after this.  She needs to try to use these sparingly because I will not refill them again.

## 2019-08-14 NOTE — Telephone Encounter (Signed)
Patient aware.

## 2019-08-16 ENCOUNTER — Encounter: Payer: Medicare PPO | Admitting: Physical Therapy

## 2019-08-19 ENCOUNTER — Ambulatory Visit
Admission: RE | Admit: 2019-08-19 | Discharge: 2019-08-19 | Disposition: A | Discharge status: Home / Self Care | Payer: Medicare PPO | Source: Ambulatory Visit | Attending: Internal Medicine | Admitting: Internal Medicine

## 2019-08-19 ENCOUNTER — Other Ambulatory Visit: Payer: Self-pay | Admitting: Internal Medicine

## 2019-08-19 ENCOUNTER — Other Ambulatory Visit: Payer: Self-pay

## 2019-08-19 DIAGNOSIS — Z170 Estrogen receptor positive status [ER+]: Secondary | ICD-10-CM

## 2019-08-19 DIAGNOSIS — N6489 Other specified disorders of breast: Secondary | ICD-10-CM | POA: Diagnosis not present

## 2019-08-19 DIAGNOSIS — C50411 Malignant neoplasm of upper-outer quadrant of right female breast: Secondary | ICD-10-CM

## 2019-08-19 DIAGNOSIS — Z1231 Encounter for screening mammogram for malignant neoplasm of breast: Secondary | ICD-10-CM

## 2019-08-19 DIAGNOSIS — R922 Inconclusive mammogram: Secondary | ICD-10-CM | POA: Diagnosis not present

## 2019-08-21 ENCOUNTER — Ambulatory Visit (INDEPENDENT_AMBULATORY_CARE_PROVIDER_SITE_OTHER): Payer: Medicare PPO | Admitting: Physical Therapy

## 2019-08-21 ENCOUNTER — Encounter: Payer: Medicare PPO | Admitting: Rehabilitative and Restorative Service Providers"

## 2019-08-21 ENCOUNTER — Other Ambulatory Visit: Payer: Self-pay

## 2019-08-21 DIAGNOSIS — R600 Localized edema: Secondary | ICD-10-CM

## 2019-08-21 DIAGNOSIS — M25512 Pain in left shoulder: Secondary | ICD-10-CM

## 2019-08-21 DIAGNOSIS — R293 Abnormal posture: Secondary | ICD-10-CM | POA: Diagnosis not present

## 2019-08-21 DIAGNOSIS — M25612 Stiffness of left shoulder, not elsewhere classified: Secondary | ICD-10-CM

## 2019-08-21 NOTE — Patient Instructions (Signed)
Access Code: [email or web address]  URL: [email or web address]  Date: 08/21/2019  Prepared by: Elsie Ra   Exercises  Standing Shoulder Flexion Wall Walk - 10 reps - 1 sets - 5 sec hold - 2x daily - 6x weekly  Standing Shoulder Scaption Wall Walk - 10 reps - 1 sets - 5 sec hold - 2x daily - 6x weekly  Standing Row with Resistance - 10 reps - 2-3 sets - 2x daily - 6x weekly  Shoulder extension with resistance - Neutral - 10 reps - 2-3 sets - 2x daily - 6x weekly  Shoulder External Rotation with Anchored Resistance - 10 reps - 2-3 sets - 2x daily - 6x weekly  Shoulder Internal Rotation with Resistance - 10 reps - 2-3 sets - 2x daily - 6x weekly  Standing Shoulder Flexion with Resistance - 10 reps - 1-2 sets - 2x daily - 6x weekly  Standing Shoulder Abduction with Resistance - 10 reps - 1-2 sets - 2x daily - 6x weekly

## 2019-08-21 NOTE — Therapy (Signed)
[facility] [address] [place], [place], [address] Phone: [phone]   Fax:  [phone]  Physical Therapy Treatment/Discharge PHYSICAL THERAPY DISCHARGE SUMMARY  Visits from Start of Care: 9  Current functional level related to goals / functional outcomes: See below   Remaining deficits: See below   Education / Equipment: HEP Plan: Patient agrees to discharge.  Patient goals were partially met. Patient is being discharged due to meeting the stated rehab goals.  ?????       Patient Details  Name: [name] MRN: [id number] Date of Birth: [birth year] Referring Provider (PT): Jean Rosenthal, MD   Encounter Date: 08/21/2019  PT End of Session - 08/21/19 1038    Visit Number  9    Number of Visits  22   will DC today   Date for PT Re-Evaluation  09/02/19   extended due to missed weeks   PT Start Time  1021    PT Stop Time  1100    PT Time Calculation (min)  39 min    Activity Tolerance  Patient tolerated treatment well    Behavior During Therapy  [facility] for tasks assessed/performed       Past Medical History:  Diagnosis Date  . Abscess in epidural space of L2-L5 lumbar spine 03/2006  . Anemia, iron deficiency   . Arthritis   . Breast cancer ([place]) 06/12/2017   right breast  . CAD (coronary artery disease), native coronary artery 03/30/2015  . Cataract   . Colon polyp    a. Multiple colonic polyps status post colonoscopy in October 2007, consistent with tubular adenoma, tubulovillous adenoma with no high-grade dysplasia or malignancy identified.   . Constipation   . Coronary artery disease    a. s/p CABG 06/2010: S-D2; S-PDA (at time of MV surgery).  . Depression   . Diabetes mellitus    type II  . Family history of breast cancer   . Family history of prostate cancer   . Genetic testing 09/25/2017   Multi-Cancer panel (83 genes) @ [facility] - No pathogenic mutations detected  . GERD (gastroesophageal  reflux disease)   . Hx of transient ischemic attack (TIA)    a. See stroke section.  . Hyperlipidemia   . Hypertension   . Hypoxia    a. Has history of acute hypoxic respiratory failure in the setting of bronchitis/PNA or prior admissions.  [name] MRSA infection    a. History of recurrent skin infection and soft tissue abscesses, with MRSA positive in the past.  . 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 mitral valve - s/p resection and MV repair 06/2010 Dr. [name].  . 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 Personal history of radiation therapy   . Sleep apnea    does not use CPAP  . Stenosis of middle cerebral artery    a. Distal R MCA.  . Stroke [facility])    a. 11/2009: mitral mass diagnosed at this time, also has distal R MCA stenosis, tx with coumadin. b. Readmitted 05/2010 with TIA symptoms - had not been taking Coumadin. s/p MV surgery 06/2010. Coumadin stopped 2013 after review of chart by Dr. [name] since mass was removed (stroke felt possibly related to this).     Past Surgical History:  Procedure Laterality Date  . BREAST LUMPECTOMY Right   . BREAST LUMPECTOMY WITH RADIOACTIVE SEED AND SENTINEL LYMPH NODE BIOPSY Right 07/20/2017   Procedure: RIGHT BREAST LUMPECTOMY WITH RADIOACTIVE SEED AND SENTINEL LYMPH NODE BIOPSY;  Surgeon: Alphonsa Overall, MD;  Location: [place];  Service: General;  Laterality: Right;  . CARDIAC CATHETERIZATION  04/03/2015   Procedure: Left Heart Cath and Cors/Grafts Angiography;  Surgeon: Peter M Martinique, MD;  Location: [place] [facility];  Service: Cardiovascular;;  . CORONARY ARTERY BYPASS GRAFT  1/12  . history of ankle fractures requiring surgery    . LUMBAR LAMINECTOMY/DECOMPRESSION MICRODISCECTOMY Left 04/15/2015   Procedure: LUMBAR LAMINECTOMY/DECOMPRESSION MICRODISCECTOMY 1 LEVEL;  Surgeon: Newman Pies, MD;  Location: [place] [facility];  Service: Neurosurgery;  Laterality: Left;  Left L23 microdiskectomy  . MV repair and resection of mass  1/12  .  RESECTION DISTAL CLAVICAL Left 05/16/2019   Procedure: RESECTION DISTAL CLAVICAL;  Surgeon: Mcarthur Rossetti, MD;  Location: [place];  Service: Orthopedics;  Laterality: Left;  . SHOULDER ARTHROSCOPY WITH ROTATOR CUFF REPAIR AND SUBACROMIAL DECOMPRESSION Left 05/16/2019   Procedure: SHOULDER ARTHROSCOPY WITH ROTATOR CUFF REPAIR AND SUBACROMIAL DECOMPRESSION;  Surgeon: Mcarthur Rossetti, MD;  Location: [place];  Service: Orthopedics;  Laterality: Left;  . TONSILLECTOMY  1971  . TOTAL ABDOMINAL HYSTERECTOMY  1978  . VESICOVAGINAL FISTULA CLOSURE W/ TAH      There were no vitals filed for this visit.  Subjective Assessment - 08/21/19 1037    Subjective  relays her shoulder feels good, feels ready for discharge today.    Limitations  Lifting    Patient Stated Goals  regain use of Lt shoulder, improve motion and strength, improve pain    Currently in Pain?  No/denies    Pain Onset  More than a month ago         [facility] PT Assessment - 08/21/19 0001      Assessment   Medical Diagnosis  Lt RTC repair    Referring Provider (PT)  Jean Rosenthal, MD    Onset Date/Surgical Date  05/16/19      AROM   Left Shoulder Flexion  160 Degrees    Left Shoulder ABduction  164 Degrees    Left Shoulder Internal Rotation  --   WNL   Left Shoulder External Rotation  --   WNL     Strength   Left Shoulder Flexion  4+/5    Left Shoulder ABduction  4+/5    Left Shoulder Internal Rotation  5/5    Left Shoulder External Rotation  4+/5              Outpatient [facility]19/[facility]  Lymphedema Life Impact Scale Total Score  41.18 %           [facility] Adult PT Treatment/Exercise - 08/21/19 0001      Exercises   Exercises  Other Exercises    Other Exercises   Lt Shoulder exercises. Pulleys  X2 min ea for flexion and abduction, wall walks X 10 ea for flexion and abduction, standing resited  flexion and abduction X 15 ea with L1 band,  then 15 reps ea with L3 band for rows, shoulder extensions, IR/ER       Vasopneumatic   Number Minutes Vasopneumatic   10 minutes    Vasopnuematic Location   Shoulder    Vasopneumatic Pressure  Medium    Vasopneumatic Temperature   34 deg      Manual Therapy   Manual Therapy  Passive ROM             PT Education - 08/21/19 1053    Education Details  HEP  progression    Person(s) Educated  Patient    Methods  Explanation;Demonstration;Verbal cues;Handout    Comprehension  Verbalized understanding;Returned demonstration       PT Short Term Goals - 07/24/19 1054      PT SHORT TERM GOAL #1   Title  independent with initial HEP    Status  Partially Met    Target Date  07/22/19      PT SHORT TERM GOAL #2   Title  Lt shoulder AROM improved by 15 degress each direction for improved function    Status  Achieved    Target Date  07/22/19        PT Long Term Goals - 08/21/19 1045      PT LONG TERM GOAL #1   Title  independent with advanced HEP    Status  Achieved      PT LONG TERM GOAL #2   Title  improve Lt shoulder AROM to [facility] for improved function    Status  Achieved      PT LONG TERM GOAL #3   Title  report pain < 4/10 with activity for improved function    Status  Achieved      PT LONG TERM GOAL #4   Title  demonstrate at least 4/5 Lt shoulder strength for improved function    Status  Achieved      PT LONG TERM GOAL #5   Title  n/a            Plan - 08/21/19 1046    Clinical Impression Statement  She has met all PT goals and now has [facility] strength and ROM for her Lt shoulder. She is pleased with her progress and feels ready to discharge without any questions or concerns.    Personal Factors and Comorbidities  Comorbidity 3+    Comorbidities  CVA, HTN, depression, DM, L2-5 abscess in epidural space, arthritis, hx breast cancer, CAD s/p CABG    Examination-Activity Limitations  Bathing;Lift;Bed Mobility;Reach  Overhead;Self Feeding;Carry;Sleep;Hygiene/Grooming;Dressing    Examination-Participation Restrictions  Cleaning;Meal Prep    Stability/Clinical Decision Making  Evolving/Moderate complexity    Rehab Potential  Good    PT Frequency  2x / week    PT Duration  8 weeks    PT Treatment/Interventions  ADLs/Self Care Home Management;Cryotherapy;Electrical Stimulation;Ultrasound;Moist Heat;Functional mobility training;Therapeutic activities;Therapeutic exercise;Patient/family education;Neuromuscular re-education;Manual techniques;Vasopneumatic Device;Taping;Dry needling;Passive range of motion    PT Next Visit Plan  Discharge today    PT Home Exercise Plan  Access Code: [email or web address]    Consulted and Agree with Plan of Care  Patient       Patient will benefit from skilled therapeutic intervention in order to improve the following deficits and impairments:  Increased fascial restricitons, Pain, Postural dysfunction, Decreased mobility, Decreased activity tolerance, Decreased range of motion, Decreased strength, Impaired UE functional use  Visit Diagnosis: Acute pain of left shoulder  Stiffness of left shoulder, not elsewhere classified  Abnormal posture  Localized edema     Problem List Patient Active Problem List   Diagnosis Date Noted  . Spinal stenosis of lumbar region 05/21/2019  . Complete tear of left rotator cuff 05/16/2019  . Uncontrolled type 2 diabetes mellitus with hyperglycemia ([place]) 12/19/2018  . Microalbuminuria 04/08/2018  . Genetic testing 09/25/2017  . Family history of breast cancer   . Family history of prostate cancer   . Malignant neoplasm of upper-outer quadrant of right breast in female, estrogen receptor positive ([place]) 06/26/2017  . Lumbar herniated disc 04/15/2015  .  Abnormal nuclear stress test 04/03/2015  . 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 CAD (coronary artery disease), native coronary artery 03/30/2015  . S/P mitral valve repair 11/16/2010  .  Dyslipidemia 06/29/2010  . Diabetes ([place]) 09/29/2006  . ANEMIA-IRON DEFICIENCY 09/29/2006  . Essential hypertension 09/29/2006    Debbe Odea, PT,DPT 08/21/2019, [facility] [address] [place], [place], [address] Phone: [phone]   Fax:  [phone]  Name: Zettie Gootee MRN: [id number] Date of Birth: 1950-11-20

## 2019-08-22 ENCOUNTER — Other Ambulatory Visit: Payer: Self-pay | Admitting: Internal Medicine

## 2019-08-22 DIAGNOSIS — I10 Essential (primary) hypertension: Secondary | ICD-10-CM

## 2019-08-26 HISTORY — PX: BREAST BIOPSY: SHX20

## 2019-09-03 ENCOUNTER — Ambulatory Visit
Admission: RE | Admit: 2019-09-03 | Discharge: 2019-09-03 | Disposition: A | Discharge status: Home / Self Care | Payer: Medicare PPO | Source: Ambulatory Visit | Attending: Internal Medicine | Admitting: Internal Medicine

## 2019-09-03 ENCOUNTER — Other Ambulatory Visit: Payer: Self-pay

## 2019-09-03 DIAGNOSIS — Z170 Estrogen receptor positive status [ER+]: Secondary | ICD-10-CM

## 2019-09-03 DIAGNOSIS — C50411 Malignant neoplasm of upper-outer quadrant of right female breast: Secondary | ICD-10-CM

## 2019-09-03 DIAGNOSIS — Z1231 Encounter for screening mammogram for malignant neoplasm of breast: Secondary | ICD-10-CM

## 2019-09-03 DIAGNOSIS — N6031 Fibrosclerosis of right breast: Secondary | ICD-10-CM | POA: Diagnosis not present

## 2019-09-03 DIAGNOSIS — N6311 Unspecified lump in the right breast, upper outer quadrant: Secondary | ICD-10-CM | POA: Diagnosis not present

## 2019-09-04 ENCOUNTER — Other Ambulatory Visit: Payer: Self-pay | Admitting: Internal Medicine

## 2019-09-04 DIAGNOSIS — N63 Unspecified lump in unspecified breast: Secondary | ICD-10-CM

## 2019-09-11 ENCOUNTER — Other Ambulatory Visit: Payer: Self-pay

## 2019-09-11 ENCOUNTER — Other Ambulatory Visit: Payer: Self-pay | Admitting: Internal Medicine

## 2019-09-11 ENCOUNTER — Ambulatory Visit
Admission: RE | Admit: 2019-09-11 | Discharge: 2019-09-11 | Disposition: A | Discharge status: Home / Self Care | Payer: Medicare PPO | Source: Ambulatory Visit | Attending: Internal Medicine | Admitting: Internal Medicine

## 2019-09-11 DIAGNOSIS — N63 Unspecified lump in unspecified breast: Secondary | ICD-10-CM

## 2019-09-11 DIAGNOSIS — N6310 Unspecified lump in the right breast, unspecified quadrant: Secondary | ICD-10-CM

## 2019-09-20 DIAGNOSIS — Z20828 Contact with and (suspected) exposure to other viral communicable diseases: Secondary | ICD-10-CM | POA: Diagnosis not present

## 2019-09-20 DIAGNOSIS — Z20822 Contact with and (suspected) exposure to covid-19: Secondary | ICD-10-CM | POA: Diagnosis not present

## 2019-10-07 ENCOUNTER — Other Ambulatory Visit: Payer: Self-pay | Admitting: Internal Medicine

## 2019-10-07 ENCOUNTER — Other Ambulatory Visit: Payer: Self-pay | Admitting: Family Medicine

## 2019-10-07 DIAGNOSIS — E1165 Type 2 diabetes mellitus with hyperglycemia: Secondary | ICD-10-CM

## 2019-10-07 DIAGNOSIS — E782 Mixed hyperlipidemia: Secondary | ICD-10-CM

## 2019-10-07 DIAGNOSIS — I10 Essential (primary) hypertension: Secondary | ICD-10-CM

## 2019-10-07 DIAGNOSIS — E119 Type 2 diabetes mellitus without complications: Secondary | ICD-10-CM

## 2019-10-08 NOTE — Telephone Encounter (Signed)
Methocarbamol is not on patient's current med list. This was discontinued by Dr. Wynetta Emery on 07/29/19 - will route to her to review.

## 2019-11-05 ENCOUNTER — Other Ambulatory Visit: Payer: Self-pay

## 2019-11-05 ENCOUNTER — Ambulatory Visit (INDEPENDENT_AMBULATORY_CARE_PROVIDER_SITE_OTHER): Payer: Medicare PPO | Admitting: Orthopaedic Surgery

## 2019-11-05 ENCOUNTER — Encounter: Payer: Self-pay | Admitting: Orthopaedic Surgery

## 2019-11-05 DIAGNOSIS — Z9889 Other specified postprocedural states: Secondary | ICD-10-CM | POA: Diagnosis not present

## 2019-11-05 NOTE — Progress Notes (Signed)
The patient is a 69 year old female who is now almost 6 months out from a left shoulder arthroscopic rotator cuff repair.  She reports that she is doing well.  She does get a twinge of pain at times.  She is done with her physical therapy and reports good range of motion and good satisfaction with her left shoulder.  On exam her range of motion is almost entirely full of her left shoulder.  She lacks full abduction but only about 5 degrees or less.  She has excellent strength with abduction as well as external rotation of her shoulder which is at least 4+ out of 5.  Her incisions of all healed.  This point follow-up can be as needed since she is doing so well.  All question concerns were answered and addressed.

## 2019-11-29 ENCOUNTER — Telehealth: Payer: Self-pay | Admitting: Nurse Practitioner

## 2019-11-29 NOTE — Telephone Encounter (Signed)
Faxed medical records to [facility] @ fax#[phone]. Release [id number]

## 2019-12-04 ENCOUNTER — Telehealth: Payer: Self-pay | Admitting: Orthopaedic Surgery

## 2019-12-04 NOTE — Telephone Encounter (Signed)
I called , she did not answer, I left message I would be able to see her in office if she calls and makes an appt. , no pain meds, had multiple refills , she can make appt and come in if she wants.

## 2019-12-04 NOTE — Telephone Encounter (Signed)
noted 

## 2019-12-04 NOTE — Telephone Encounter (Signed)
Patient called stating that she is in a lot of pain in her back.  Patient was taking pain medication that Dr. Ninfa Linden gave her for her shoulder, but since she has been released from Dr. Ninfa Linden, she does not have any pain medication.  She is wanting to know if Dr. [name] would send in an RX for pain medication.  Patient uses [facility] on [address].  CB#[phone].  Thank you.

## 2019-12-04 NOTE — Telephone Encounter (Signed)
Please advise 

## 2019-12-05 NOTE — Telephone Encounter (Signed)
Patient returned our call.   I informed her of the message from Dr.Yates. She understands that he will not grant the refill without her being seen first. In response she asked if Dr.Hilts could grant it instead.

## 2019-12-24 ENCOUNTER — Encounter: Payer: Self-pay | Admitting: Orthopaedic Surgery

## 2019-12-24 ENCOUNTER — Ambulatory Visit (INDEPENDENT_AMBULATORY_CARE_PROVIDER_SITE_OTHER): Payer: Medicare PPO | Admitting: Orthopaedic Surgery

## 2019-12-24 ENCOUNTER — Telehealth: Payer: Self-pay

## 2019-12-24 VITALS — Ht 67.75 in | Wt 225.0 lb

## 2019-12-24 DIAGNOSIS — M48062 Spinal stenosis, lumbar region with neurogenic claudication: Secondary | ICD-10-CM | POA: Diagnosis not present

## 2019-12-24 NOTE — Telephone Encounter (Signed)
noted 

## 2019-12-24 NOTE — Progress Notes (Signed)
Office Visit Note   Patient: [name]           Date of Birth: 08/02/1950           MRN: [id number] Visit Date: 12/24/2019              Requested by: [name], MD [address] [place],  [place] [address] PCP: [name], MD   Assessment & Plan: Visit Diagnoses:  1. Spinal stenosis of lumbar region with neurogenic claudication     Plan: Previous MRI from last winter reviewed with the patient progressive moderately severe stenosis at L3-4 with slight compression of both lateral recess.  She also had some moderate stenosis at the L2-3 level.  We will set up for an epidural with Dr. Ernestina Patches see her back after the injection.  Follow-Up Instructions: No follow-ups on file.   Orders:  No orders of the defined types were placed in this encounter.  No orders of the defined types were placed in this encounter.     Procedures: No procedures performed   Clinical Data: No additional findings.   Subjective: Chief Complaint  Patient presents with  . Lower Back - Pain    HPI 69 year old female with low back pain she rates it as excruciating.  She has had 1 lumbar procedure in the past was told she had bulging disc on MRI scan.  Pain radiates from her back shoots into her anterior thigh.  Right leg and left leg are equal in severity.  Patient states she cries daily and has to ambulate with a cane.  Patient does have coronary artery disease hypertension mitral valve repair and known spinal stenosis lumbar.  Previous shoulder surgery by Dr.Blackman.  Lumbar surgery by Dr. Arnoldo Morale L2-3 on the left.  Previous CVA.  Type 2 diabetes.  Review of Systems view of systems update noncontributory to HPI other than as mentioned above.   Objective: Vital Signs: Ht 5' 7.75" (1.721 m)   Wt 225 lb (102.1 kg)   BMI 34.46 kg/m   Physical Exam Constitutional:      Appearance: She is well-developed.  HENT:     Head: Normocephalic.     Right Ear: External ear  normal.     Left Ear: External ear normal.  Eyes:     Pupils: Pupils are equal, round, and reactive to light.  Neck:     Thyroid: No thyromegaly.     Trachea: No tracheal deviation.  Cardiovascular:     Rate and Rhythm: Normal rate.  Pulmonary:     Effort: Pulmonary effort is normal.  Abdominal:     Palpations: Abdomen is soft.  Skin:    General: Skin is warm and dry.  Neurological:     Mental Status: She is alert and oriented to person, place, and time.  Psychiatric:        Behavior: Behavior normal.     Ortho Exam patient is amatory with a cane.  Mild valgus to the right knee crepitus with knee extension right and left.  There is bilateral quad weakness.  Specialty Comments:  No specialty comments available.  Imaging: cyst adjacent to the anterior aspect of the left facet joint lateral to the left neural foramen without neural impingement.  IMPRESSION: 1. Progressive moderately severe spinal stenosis at L3-4 with slight compression of both lateral recesses which could affect the L4 nerve roots. 2. New soft disc protrusion into the left neural foramen at L2-3 which could affect the  left L2 nerve lateral to the neural foramen. New moderate spinal stenosis at L2-3. 3. Moderate left facet arthritis at L4-5.   Electronically Signed   By: Lorriane Shire M.D.   On: 04/22/2019 09:53   PMFS History: Patient Active Problem List   Diagnosis Date Noted  . Spinal stenosis of lumbar region 05/21/2019  . Complete tear of left rotator cuff 05/16/2019  . Uncontrolled type 2 diabetes mellitus with hyperglycemia ([place]) 12/19/2018  . Microalbuminuria 04/08/2018  . Genetic testing 09/25/2017  . Family history of breast cancer   . Family history of prostate cancer   . Malignant neoplasm of upper-outer quadrant of right breast in female, estrogen receptor positive ([place]) 06/26/2017  . Lumbar herniated disc 04/15/2015  . Abnormal nuclear stress test 04/03/2015  . 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 CAD (coronary artery disease), native coronary artery 03/30/2015  . S/P mitral valve repair 11/16/2010  . Dyslipidemia 06/29/2010  . Diabetes ([place]) 09/29/2006  . ANEMIA-IRON DEFICIENCY 09/29/2006  . Essential hypertension 09/29/2006   Past Medical History:  Diagnosis Date  . Abscess in epidural space of L2-L5 lumbar spine 03/2006  . Anemia, iron deficiency   . Arthritis   . Breast cancer ([place]) 06/12/2017   right breast  . CAD (coronary artery disease), native coronary artery 03/30/2015  . Cataract   . Colon polyp    a. Multiple colonic polyps status post colonoscopy in October 2007, consistent with tubular adenoma, tubulovillous adenoma with no high-grade dysplasia or malignancy identified.   . Constipation   . Coronary artery disease    a. s/p CABG 06/2010: S-D2; S-PDA (at time of MV surgery).  . Depression   . Diabetes mellitus    type II  . Family history of breast cancer   . Family history of prostate cancer   . Genetic testing 09/25/2017   Multi-Cancer panel (83 genes) @ [facility] - No pathogenic mutations detected  . GERD (gastroesophageal reflux disease)   . Hx of transient ischemic attack (TIA)    a. See stroke section.  . Hyperlipidemia   . Hypertension   . Hypoxia    a. Has history of acute hypoxic respiratory failure in the setting of bronchitis/PNA or prior admissions.  [name] MRSA infection    a. History of recurrent skin infection and soft tissue abscesses, with MRSA positive in the past.  . 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 mitral valve - s/p resection and MV repair 06/2010 Dr. [name].  . 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 Personal history of radiation therapy   . Sleep apnea    does not use CPAP  . Stenosis of middle cerebral artery    a. Distal R MCA.  . Stroke [facility])    a. 11/2009: mitral mass diagnosed at this time, also has distal R MCA stenosis, tx with coumadin. b. Readmitted 05/2010 with TIA symptoms -  had not been taking Coumadin. s/p MV surgery 06/2010. Coumadin stopped 2013 after review of chart by Dr. [name] since mass was removed (stroke felt possibly related to this).     Family History  Problem Relation Age of Onset  . Colon cancer Mother        dx 62s; deceased 59  . Irritable bowel syndrome Mother   . Prostate cancer Father        deceased 15s  . Cirrhosis Sister        died of GI bleed associated with cirrhosis of the liver  .  Stroke Brother   . Prostate cancer Brother 51       deceased 7  . Diabetes Brother        x3  . Kidney disease Brother   . Cancer Brother 93       unk. type  . Breast cancer Cousin        several maternal 1st and 2nd cousins with breast cancer    Past Surgical History:  Procedure Laterality Date  . BREAST LUMPECTOMY Right   . BREAST LUMPECTOMY WITH RADIOACTIVE SEED AND SENTINEL LYMPH NODE BIOPSY Right 07/20/2017   Procedure: RIGHT BREAST LUMPECTOMY WITH RADIOACTIVE SEED AND SENTINEL LYMPH NODE BIOPSY;  Surgeon: Alphonsa Overall, MD;  Location: [facility];  Service: General;  Laterality: Right;  . CARDIAC CATHETERIZATION  04/03/2015   Procedure: Left Heart Cath and Cors/Grafts Angiography;  Surgeon: Peter M Martinique, MD;  Location: [facility];  Service: Cardiovascular;;  . CORONARY ARTERY BYPASS GRAFT  1/12  . history of ankle fractures requiring surgery    . LUMBAR LAMINECTOMY/DECOMPRESSION MICRODISCECTOMY Left 04/15/2015   Procedure: LUMBAR LAMINECTOMY/DECOMPRESSION MICRODISCECTOMY 1 LEVEL;  Surgeon: Newman Pies, MD;  Location: [facility];  Service: Neurosurgery;  Laterality: Left;  Left L23 microdiskectomy  . MV repair and resection of mass  1/12  . RESECTION DISTAL CLAVICAL Left 05/16/2019   Procedure: RESECTION DISTAL CLAVICAL;  Surgeon: Mcarthur Rossetti, MD;  Location: [facility];  Service: Orthopedics;  Laterality: Left;  . SHOULDER ARTHROSCOPY WITH ROTATOR CUFF REPAIR AND SUBACROMIAL DECOMPRESSION Left 05/16/2019     Procedure: SHOULDER ARTHROSCOPY WITH ROTATOR CUFF REPAIR AND SUBACROMIAL DECOMPRESSION;  Surgeon: Mcarthur Rossetti, MD;  Location: [facility];  Service: Orthopedics;  Laterality: Left;  . TONSILLECTOMY  1971  . TOTAL ABDOMINAL HYSTERECTOMY  1978  . VESICOVAGINAL FISTULA CLOSURE W/ TAH     Social History   Occupational History    Employer: UNEMPLOYED  Tobacco Use  . Smoking status: Former Smoker    Quit date: 10/14/2010    Years since quitting: 9.2  . Smokeless tobacco: Never Used  Vaping Use  . Vaping Use: Never used  Substance and Sexual Activity  . Alcohol use: Yes    Comment: social  . Drug use: No  . Sexual activity: Not Currently

## 2019-12-24 NOTE — Telephone Encounter (Signed)
Order accepted in [place] pending patient contact [place] for rolling walker

## 2020-01-20 ENCOUNTER — Ambulatory Visit: Payer: Medicare PPO | Attending: Internal Medicine | Admitting: Pharmacist

## 2020-01-20 ENCOUNTER — Encounter: Payer: Self-pay | Admitting: Pharmacist

## 2020-01-20 ENCOUNTER — Other Ambulatory Visit: Payer: Self-pay

## 2020-01-20 DIAGNOSIS — E1159 Type 2 diabetes mellitus with other circulatory complications: Secondary | ICD-10-CM | POA: Diagnosis not present

## 2020-01-20 DIAGNOSIS — Z0000 Encounter for general adult medical examination without abnormal findings: Secondary | ICD-10-CM | POA: Diagnosis not present

## 2020-01-20 DIAGNOSIS — I10 Essential (primary) hypertension: Secondary | ICD-10-CM

## 2020-01-20 DIAGNOSIS — I25810 Atherosclerosis of coronary artery bypass graft(s) without angina pectoris: Secondary | ICD-10-CM | POA: Diagnosis not present

## 2020-01-20 DIAGNOSIS — Z23 Encounter for immunization: Secondary | ICD-10-CM

## 2020-01-20 DIAGNOSIS — F32A Depression, unspecified: Secondary | ICD-10-CM

## 2020-01-20 DIAGNOSIS — F329 Major depressive disorder, single episode, unspecified: Secondary | ICD-10-CM

## 2020-01-20 MED ORDER — CARVEDILOL 6.25 MG PO TABS: 6.2500 mg | ORAL_TABLET | Freq: Two times a day (BID) | ORAL | 3 refills | Status: DC

## 2020-01-20 MED ORDER — BUPROPION HCL ER (XL) 150 MG PO TB24: 150.0000 mg | ORAL_TABLET | Freq: Two times a day (BID) | ORAL | 2 refills | Status: DC

## 2020-01-20 MED ORDER — BACLOFEN 10 MG PO TABS: 5.0000 mg | ORAL_TABLET | Freq: Three times a day (TID) | ORAL | 3 refills | Status: DC | PRN

## 2020-01-20 MED ORDER — DICLOFENAC SODIUM 1 % EX GEL: CUTANEOUS | 0 refills | Status: DC

## 2020-01-20 NOTE — Progress Notes (Signed)
Subjective:   [name] is a 69 y.o. female who presents for Medicare Annual (Subsequent) preventive examination.   Objective:    Today's Vitals   01/20/20 1647 01/20/20 1648  BP: 124/71   Pulse: 72   Temp: 98.6 F (37 C)   SpO2: 96%   Weight: (!) 240 lb 12.8 oz (109.2 kg)   Height: _0  (1.702 m)   PainSc: 8  8    Body mass index is 37.71 kg/m.  Advanced Directives 01/20/2020 06/24/2019 05/16/2019 05/10/2019 10/19/2017 10/16/2017 08/16/2017  Does Patient Have a Medical Advance Directive? No Yes _1   Type of Advance Directive - [place]  Would patient like information on creating a medical advance directive? No - Patient declined - No - Patient declined No - Patient declined (No Data) - -  Pre-existing out of facility DNR order (yellow form or pink MOST form) - - - - - - -   Current Medications (verified) Outpatient Encounter Medications as of 01/20/2020  Medication Sig  . ACCU-CHEK SOFTCLIX LANCETS lancets Use as instructed  . acetaminophen (ACETAMINOPHEN EXTRA STRENGTH) 500 MG tablet Take 1 tablet (500 mg total) by mouth every 8 (eight) hours as needed.  [name] albuterol (PROVENTIL HFA;VENTOLIN HFA) 108 (90 Base) MCG/ACT inhaler Inhale 2 puffs into the lungs every 6 (six) hours as needed for wheezing or shortness of breath.  [name] amLODipine (NORVASC) 10 MG tablet TAKE 1 TABLET (10 MG TOTAL) BY MOUTH DAILY.  [name] aspirin EC 81 MG tablet Take 1 tablet (81 mg total) by mouth daily.  [name] atorvastatin (LIPITOR) 40 MG tablet TAKE 1 AND 1/2 TABLETS EVERY DAY  . baclofen (LIORESAL) 10 MG tablet Take 0.5-1 tablets (5-10 mg total) by mouth 3 (three) times daily as needed for muscle spasms.  . Blood Glucose Monitoring Suppl (ACCU-CHEK AVIVA PLUS) w/Device KIT Use as directed  . buPROPion (WELLBUTRIN XL) 150 MG 24 hr tablet Take 1 tablet (150 mg total) by mouth 2 (two) times daily.  . carvedilol (COREG) 6.25 MG tablet Take 1 tablet (6.25 mg  total) by mouth 2 (two) times daily with a meal.  . diclofenac sodium (VOLTAREN) 1 % GEL Apply 2 g topically 4 (four) times daily as needed.  . diclofenac Sodium (VOLTAREN) 1 % GEL APPLY 2 GRAMS TOPICALLY 4 (FOUR) TIMES DAILY AS NEEDED.  . ferrous sulfate (FEROSUL) 325 (65 FE) MG tablet TAKE 1 TABLET (325 MG TOTAL) BY MOUTH DAILY WITH BREAKFAST.  [name] glipiZIDE (GLUCOTROL) 5 MG tablet TAKE 1 TABLET DAILY BEFORE BREAKFAST  . glucose blood (ACCU-CHEK AVIVA PLUS) test strip Use as instructed  . hydrochlorothiazide (HYDRODIURIL) 25 MG tablet TAKE 1 TABLET EVERY DAY  . letrozole (FEMARA) 2.5 MG tablet Take 1 tablet (2.5 mg total) by mouth daily.  [name] linagliptin (TRADJENTA) 5 MG TABS tablet Take 1 tablet (5 mg total) by mouth daily.  [name] losartan (COZAAR) 100 MG tablet TAKE 1 TABLET EVERY DAY  . metFORMIN (GLUCOPHAGE) 1000 MG tablet TAKE 1 TABLET TWICE DAILY WITH MEALS  . pantoprazole (PROTONIX) 40 MG tablet TAKE 1 TABLET EVERY DAY  . polyethylene glycol (MIRALAX / GLYCOLAX) 17 g packet Take 17 g by mouth daily as needed for mild constipation or moderate constipation.  . traZODone (DESYREL) 50 MG tablet TAKE 1 TABLET AT BEDTIME AS NEEDED FOR SLEEP  . [DISCONTINUED] HYDROcodone-acetaminophen (NORCO/VICODIN) 5-325 MG tablet Take 1-2 tablets by mouth 3 (three) times daily as  needed for moderate pain. (Patient not taking: Reported on 12/24/2019)   No facility-administered encounter medications on file as of 01/20/2020.   Allergies (verified) Patient has no known allergies.   History: Past Medical History:  Diagnosis Date  . Abscess in epidural space of L2-L5 lumbar spine 03/2006  . Anemia, iron deficiency   . Arthritis   . Breast cancer ([place]) 06/12/2017   right breast  . CAD (coronary artery disease), native coronary artery 03/30/2015  . Cataract   . Colon polyp    a. Multiple colonic polyps status post colonoscopy in October 2007, consistent with tubular adenoma, tubulovillous adenoma with no  high-grade dysplasia or malignancy identified.   . Constipation   . Coronary artery disease    a. s/p CABG 06/2010: S-D2; S-PDA (at time of MV surgery).  . Depression   . Diabetes mellitus    type II  . Family history of breast cancer   . Family history of prostate cancer   . Genetic testing 09/25/2017   Multi-Cancer panel (83 genes) @ [facility] - No pathogenic mutations detected  . GERD (gastroesophageal reflux disease)   . Hx of transient ischemic attack (TIA)    a. See stroke section.  . Hyperlipidemia   . Hypertension   . Hypoxia    a. Has history of acute hypoxic respiratory failure in the setting of bronchitis/PNA or prior admissions.  [name] MRSA infection    a. History of recurrent skin infection and soft tissue abscesses, with MRSA positive in the past.  . 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 mitral valve - s/p resection and MV repair 06/2010 Dr. [name].  . 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 Personal history of radiation therapy   . Sleep apnea    does not use CPAP  . Stenosis of middle cerebral artery    a. Distal R MCA.  . Stroke [facility])    a. 11/2009: mitral mass diagnosed at this time, also has distal R MCA stenosis, tx with coumadin. b. Readmitted 05/2010 with TIA symptoms - had not been taking Coumadin. s/p MV surgery 06/2010. Coumadin stopped 2013 after review of chart by Dr. [name] since mass was removed (stroke felt possibly related to this).    Past Surgical History:  Procedure Laterality Date  . BREAST LUMPECTOMY Right   . BREAST LUMPECTOMY WITH RADIOACTIVE SEED AND SENTINEL LYMPH NODE BIOPSY Right 07/20/2017   Procedure: RIGHT BREAST LUMPECTOMY WITH RADIOACTIVE SEED AND SENTINEL LYMPH NODE BIOPSY;  Surgeon: Alphonsa Overall, MD;  Location: [facility];  Service: General;  Laterality: Right;  . CARDIAC CATHETERIZATION  04/03/2015   Procedure: Left Heart Cath and Cors/Grafts Angiography;  Surgeon: Peter M Martinique, MD;  Location: [facility];  Service:  Cardiovascular;;  . CORONARY ARTERY BYPASS GRAFT  1/12  . history of ankle fractures requiring surgery    . LUMBAR LAMINECTOMY/DECOMPRESSION MICRODISCECTOMY Left 04/15/2015   Procedure: LUMBAR LAMINECTOMY/DECOMPRESSION MICRODISCECTOMY 1 LEVEL;  Surgeon: Newman Pies, MD;  Location: [facility];  Service: Neurosurgery;  Laterality: Left;  Left L23 microdiskectomy  . MV repair and resection of mass  1/12  . RESECTION DISTAL CLAVICAL Left 05/16/2019   Procedure: RESECTION DISTAL CLAVICAL;  Surgeon: Mcarthur Rossetti, MD;  Location: [facility];  Service: Orthopedics;  Laterality: Left;  . SHOULDER ARTHROSCOPY WITH ROTATOR CUFF REPAIR AND SUBACROMIAL DECOMPRESSION Left 05/16/2019   Procedure: SHOULDER ARTHROSCOPY WITH ROTATOR CUFF REPAIR AND SUBACROMIAL DECOMPRESSION;  Surgeon: Mcarthur Rossetti, MD;  Location: [facility];  Service: Orthopedics;  Laterality: Left;  . TONSILLECTOMY  1971  . TOTAL ABDOMINAL HYSTERECTOMY  1978  . VESICOVAGINAL FISTULA CLOSURE W/ TAH     Family History  Problem Relation Age of Onset  . Colon cancer Mother        dx 49s; deceased 47  . Irritable bowel syndrome Mother   . Prostate cancer Father        deceased 95s  . Cirrhosis Sister        died of GI bleed associated with cirrhosis of the liver  . Stroke Brother   . Prostate cancer Brother 62       deceased 66  . Diabetes Brother        x3  . Kidney disease Brother   . Cancer Brother 62       unk. type  . Breast cancer Cousin        several maternal 1st and 2nd cousins with breast cancer   Social History   Socioeconomic History  . Marital status: Married    Spouse name: Not on file  . Number of children: 3  . Years of education: Not on file  . Highest education level: Not on file  Occupational History    Employer: UNEMPLOYED  Tobacco Use  . Smoking status: Former Smoker    Quit date: 10/14/2010    Years since quitting: 9.2  . Smokeless tobacco: Never  Used  Vaping Use  . Vaping Use: Never used  Substance and Sexual Activity  . Alcohol use: Yes    Comment: social  . Drug use: No  . Sexual activity: Not Currently  Other Topics Concern  . Not on file  Social History Narrative   Lives with husband, stay at home, uses cane occasionally, still active/ambulatory.    Social Determinants of Health   Financial Resource Strain:   . Difficulty of Paying Living Expenses:   Food Insecurity:   . Worried About Charity fundraiser in the Last Year:   . Arboriculturist in the Last Year:   Transportation Needs:   . Film/video editor (Medical):   [name] Lack of Transportation (Non-Medical):   Physical Activity:   . Days of Exercise per Week:   . Minutes of Exercise per Session:   Stress:   . Feeling of Stress :   Social Connections:   . Frequency of Communication with Friends and Family:   . Frequency of Social Gatherings with Friends and Family:   . Attends Religious Services:   . Active Member of Clubs or Organizations:   . Attends Archivist Meetings:   [name] Marital Status:     Tobacco Counseling Counseling given: Not Answered   Clinical Intake:  Pre-visit preparation completed: Yes  Pain : 0-10 Pain Score: 8  Pain Type: Chronic pain Pain Location: Back Pain Onset: More than a month ago Pain Frequency: Constant Pain Relieving Factors: Some relief with topical Voltaren and OTC Tylenol but per pt, this brings level down from a "10" to an "8" Effect of Pain on Daily Activities: Limits patient's ADLs  Pain Relieving Factors: Some relief with topical Voltaren and OTC Tylenol but per pt, this brings level down from a "10" to an "8"  BMI - recorded: 37.71 Nutritional Status: BMI > 30  Obese Diabetes: Yes CBG done?: No Did pt. bring in CBG monitor from home?: No  How often do you need to have someone help you when you  read instructions, pamphlets, or other written materials from your doctor or pharmacy?: 1 - Never What  is the last grade level you completed in school?: 12; Completed two years of community college  Diabetic? Yes   Interpreter Needed?: No  Activities of Daily Living In your present state of health, do you have any difficulty performing the following activities: 05/16/2019  Hearing? N  Difficulty concentrating or making decisions? N  Walking or climbing stairs? Y  Dressing or bathing? N  Some recent data might be hidden   Patient Care Team: [name], MD as PCP - General (Internal Medicine) Nicholas Lose, MD as Consulting Physician (Hematology and Oncology) Kyung Rudd, MD as Consulting Physician (Radiation Oncology) Delice Bison Charlestine Massed, NP as Nurse Practitioner (Hematology and Oncology) Alphonsa Overall, MD as Consulting Physician (General Surgery)  Indicate any recent Medical Services you may have received from other than [facility] providers in the past year (date may be approximate).  Assessment:   This is a routine wellness examination for Kieli.  Dietary issues and exercise activities discussed: 150 mins of aerobic exercise/week as able; MyPlate diet; DASH diet discussed   Depression Screen PHQ 2/9 Scores 01/20/2020 07/29/2019 03/08/2019 12/19/2018 08/10/2018 04/06/2018 01/04/2018  PHQ - 2 Score 0 0 2 0 _0 PHQ- 9 Score - - 8 - 10 - 14    Fall Risk Fall Risk  01/20/2020 03/08/2019 12/19/2018 10/16/2017 08/16/2017  Falls in the past year? 0 0 0 No Yes  Number falls in past yr: 0 0 - - 1  Injury with Fall? 0 0 - - No  Risk for fall due to : Impaired balance/gait;History of fall(s);Impaired mobility - - - -  Follow up Falls evaluation completed;Education provided;Falls prevention discussed Falls evaluation completed - - -   Any stairs in or around the home? No  If so, are there any without handrails? No  Home free of loose throw rugs in walkways, pet beds, electrical cords, etc? Yes  Adequate lighting in your home to reduce risk of falls? Yes   ASSISTIVE DEVICES UTILIZED  TO PREVENT FALLS:  Life alert? No  Use of a cane, walker or w/c? Yes  Grab bars in the bathroom? No  Shower chair or bench in shower? No  Elevated toilet seat or a handicapped toilet? Yes   TIMED UP AND GO: 15 seconds  Was the test performed? Yes .  Length of time to ambulate 10 feet: 15 sec.   Gait unsteady with use of assistive device, provider informed and education provided.   Cognitive Function: MMSE - Mini Mental State Exam 01/20/2020  Orientation to time 5  Orientation to Place 5  Registration 3  Attention/ Calculation 5  Recall 2  Language- name 2 objects 2  Language- repeat 1  Language- follow 3 step command 3  Language- read & follow direction 1  Write a sentence 1  Copy design 1  Total score 29   Immunizations Immunization History  Administered Date(s) Administered  . Influenza,inj,Quad PF,6+ Mos 06/26/2014, 03/26/2015, 04/24/2017, 04/06/2018, 04/19/2019  . Pneumococcal Conjugate-13 04/24/2017  . Pneumococcal Polysaccharide-23 08/10/2018  . Tdap 03/06/2014   TDAP status: Up to date Flu Vaccine status: Up to date Pneumococcal vaccine status: Up to date Covid-19 vaccine status: Completed vaccines  Qualifies for Shingles Vaccine? Yes   Zostavax completed No   Shingrix Completed?: Yes  Screening Tests Health Maintenance  Topic Date Due  . COVID-19 Vaccine (1) Never done  . OPHTHALMOLOGY EXAM  08/30/2019  .  HEMOGLOBIN A1C  10/18/2019  . INFLUENZA VACCINE  01/26/2020  . FOOT EXAM  04/18/2020  . MAMMOGRAM  08/18/2021  . COLONOSCOPY  11/24/2021  . TETANUS/TDAP  03/06/2024  . DEXA SCAN  Completed  . Hepatitis C Screening  Completed  . PNA vac Low Risk Adult  Completed    Health Maintenance  Health Maintenance Due  Topic Date Due  . COVID-19 Vaccine (1) Never done  . OPHTHALMOLOGY EXAM  08/30/2019  . HEMOGLOBIN A1C  10/18/2019    Colorectal cancer screening: Completed 11/24/2021. Repeat every 10 years Mammogram status: Completed 08/19/2019.  Repeat every year Dexa scan: completed 05/28/2018  Lung Cancer Screening: (Low Dose CT Chest recommended if Age 14-80 years, 30 pack-year currently smoking OR have quit w/in 15years.) does not qualify.   Additional Screening:  Hepatitis C Screening: does qualify; Completed 05/23/2016  Vision Screening: Recommended annual ophthalmology exams for early detection of glaucoma and other disorders of the eye. Is the patient up to date with their annual eye exam?  Yes  Who is the provider or what is the name of the office in which the patient attends annual eye exams? Dr. Gershon Crane  Dental Screening: Recommended annual dental exams for proper oral hygiene  Community Resource Referral / Chronic Care Management: CRR required this visit?  No   CCM required this visit?  No      Plan:   I have personally reviewed and noted the following in the patient's chart:   . Medical and social history . Use of alcohol, tobacco or illicit drugs  . Current medications and supplements . Functional ability and status . Nutritional status . Physical activity . Advanced directives . List of other physicians . Hospitalizations, surgeries, and ER visits in previous 12 months . Vitals . Screenings to include cognitive, depression, and falls . Referrals and appointments  In addition, I have reviewed and discussed with patient certain preventive protocols, quality metrics, and best practice recommendations. A written personalized care plan for preventive services as well as general preventive health recommendations were provided to patient.    [place], RPH-CPP   01/20/2020

## 2020-01-21 LAB — HEPATIC FUNCTION PANEL
ALT: 8 IU/L (ref 0–32)
AST: 17 IU/L (ref 0–40)
Albumin: 4.2 g/dL (ref 3.8–4.8)
Alkaline Phosphatase: 62 IU/L (ref 48–121)
Bilirubin Total: 0.3 mg/dL (ref 0.0–1.2)
Bilirubin, Direct: 0.1 mg/dL (ref 0.00–0.40)
Total Protein: 6.7 g/dL (ref 6.0–8.5)

## 2020-01-21 LAB — LIPID PANEL
Chol/HDL Ratio: 2.5 ratio (ref 0.0–4.4)
Cholesterol, Total: 206 mg/dL — ABNORMAL HIGH (ref 100–199)
HDL: 82 mg/dL (ref >39)
LDL Chol Calc (NIH): 100 mg/dL — ABNORMAL HIGH (ref 0–99)
Triglycerides: 140 mg/dL (ref 0–149)
VLDL Cholesterol Cal: 24 mg/dL (ref 5–40)

## 2020-01-21 LAB — HEMOGLOBIN A1C
Est. average glucose Bld gHb Est-mCnc: 169 mg/dL
Hgb A1c MFr Bld: 7.5 % — ABNORMAL HIGH (ref 4.8–5.6)

## 2020-01-21 NOTE — Progress Notes (Signed)
Patient know that her LDL cholesterol is 100 with goal being less than 70.  Confirm that she is taking the atorvastatin 60 mg daily.  If she is taking it consistently and her cholesterol is not at goal, I recommend that we increase it to 80 mg daily.  Please let me know so that I can send an updated prescription to her pharmacy.  Liver function test is normal.  A1c is 7.5 with goal being less than 7.  Continue oral diabetic medications.  Continue to work on healthy eating habits and trying to move as much as she can.

## 2020-01-22 ENCOUNTER — Other Ambulatory Visit: Payer: Self-pay | Admitting: Internal Medicine

## 2020-01-22 DIAGNOSIS — E782 Mixed hyperlipidemia: Secondary | ICD-10-CM

## 2020-01-22 MED ORDER — ATORVASTATIN CALCIUM 80 MG PO TABS: 80.0000 mg | ORAL_TABLET | Freq: Every day | ORAL | 1 refills | Status: DC

## 2020-01-23 ENCOUNTER — Ambulatory Visit (INDEPENDENT_AMBULATORY_CARE_PROVIDER_SITE_OTHER): Payer: Medicare PPO | Admitting: Physical Medicine and Rehabilitation

## 2020-01-23 ENCOUNTER — Other Ambulatory Visit: Payer: Self-pay

## 2020-01-23 ENCOUNTER — Ambulatory Visit: Payer: Self-pay

## 2020-01-23 ENCOUNTER — Encounter: Payer: Self-pay | Admitting: Physical Medicine and Rehabilitation

## 2020-01-23 VITALS — BP 190/85 | HR 75

## 2020-01-23 DIAGNOSIS — M48062 Spinal stenosis, lumbar region with neurogenic claudication: Secondary | ICD-10-CM | POA: Diagnosis not present

## 2020-01-23 DIAGNOSIS — M5416 Radiculopathy, lumbar region: Secondary | ICD-10-CM | POA: Diagnosis not present

## 2020-01-23 MED ORDER — DEXAMETHASONE SODIUM PHOSPHATE 10 MG/ML IJ SOLN
15.0000 mg | Freq: Once | INTRAMUSCULAR | Status: AC
Start: 2020-01-23 — End: 2020-01-23
  Administered 2020-01-23: 15 mg

## 2020-01-23 NOTE — Procedures (Signed)
Lumbosacral Transforaminal Epidural Steroid Injection - Sub-Pedicular Approach with Fluoroscopic Guidance  Patient: [name]      Date of Birth: May 24, 1951 MRN: [id number] PCP: [name], MD      Visit Date: 01/23/2020   Universal Protocol:    Date/Time: 01/23/2020  Consent Given By: the patient  Position: PRONE  Additional Comments: Vital signs were monitored before and after the procedure. Patient was prepped and draped in the usual sterile fashion. The correct patient, procedure, and site was verified.   Injection Procedure Details:  Procedure Site One Meds Administered:  Meds ordered this encounter  Medications  . dexamethasone (DECADRON) injection 15 mg    Laterality: Bilateral  Location/Site:  L3-L4  Needle size: 24 G  Needle type: Spinal  Needle Placement: Transforaminal  Findings:    -Comments: Excellent flow of contrast along the nerve, nerve root and into the epidural space.  Procedure Details: After squaring off the end-plates to get a true AP view, the C-arm was positioned so that an oblique view of the foramen as noted above was visualized. The target area is just inferior to the "nose of the scotty dog" or sub pedicular. The soft tissues overlying this structure were infiltrated with 2-3 ml. of 1% Lidocaine without Epinephrine.  The spinal needle was inserted toward the target using a "trajectory" view along the fluoroscope beam.  Under AP and lateral visualization, the needle was advanced so it did not puncture dura and was located close the 6 O'Clock position of the pedical in AP tracterory. Biplanar projections were used to confirm position. Aspiration was confirmed to be negative for CSF and/or blood. A 1-2 ml. volume of Isovue-250 was injected and flow of contrast was noted at each level. Radiographs were obtained for documentation purposes.   After attaining the desired flow of contrast documented above, a 0.5 to 1.0 ml test  dose of 0.25% Marcaine was injected into each respective transforaminal space.  The patient was observed for 90 seconds post injection.  After no sensory deficits were reported, and normal lower extremity motor function was noted,   the above injectate was administered so that equal amounts of the injectate were placed at each foramen (level) into the transforaminal epidural space.   Additional Comments:  The patient tolerated the procedure well Dressing: 2 x 2 sterile gauze and Band-Aid    Post-procedure details: Patient was observed during the procedure. Post-procedure instructions were reviewed.  Patient left the clinic in stable condition.

## 2020-01-23 NOTE — Progress Notes (Signed)
[name] - 69 y.o. female MRN [id number]  Date of birth: 03-Sep-1950  Office Visit Note: Visit Date: 01/23/2020 PCP: [name], MD Referred by: [name], MD  Subjective: Chief Complaint  Patient presents with  . Lower Back - Pain   HPI: [name] is a 69 y.o. female who comes in today at the request of Dr. Rodell Perna for planned Bilateral L3-L4 Lumbar epidural steroid injection with fluoroscopic guidance.  The patient has failed conservative care including home exercise, medications, time and activity modification.  This injection will be diagnostic and hopefully therapeutic.  Please see requesting physician notes for further details and justification.   MRI reviewed with images and spine model.  MRI reviewed in the note below.   ROS Otherwise per HPI.  Assessment & Plan: Visit Diagnoses:  1. Lumbar radiculopathy   2. Spinal stenosis of lumbar region with neurogenic claudication     Plan: No additional findings.   Meds & Orders:  Meds ordered this encounter  Medications  . dexamethasone (DECADRON) injection 15 mg    Orders Placed This Encounter  Procedures  . XR C-ARM NO REPORT  . Epidural Steroid injection    Follow-up: Return in about 2 weeks (around 02/06/2020) for Rodell Perna, MD.   Procedures: No procedures performed  Lumbosacral Transforaminal Epidural Steroid Injection - Sub-Pedicular Approach with Fluoroscopic Guidance  Patient: [name]      Date of Birth: 06/12/1951 MRN: [id number] PCP: [name], MD      Visit Date: 01/23/2020   Universal Protocol:    Date/Time: 01/23/2020  Consent Given By: the patient  Position: PRONE  Additional Comments: Vital signs were monitored before and after the procedure. Patient was prepped and draped in the usual sterile fashion. The correct patient, procedure, and site was verified.   Injection Procedure Details:  Procedure Site One Meds  Administered:  Meds ordered this encounter  Medications  . dexamethasone (DECADRON) injection 15 mg    Laterality: Bilateral  Location/Site:  L3-L4  Needle size: 38 G  Needle type: Spinal  Needle Placement: Transforaminal  Findings:    -Comments: Excellent flow of contrast along the nerve, nerve root and into the epidural space.  Procedure Details: After squaring off the end-plates to get a true AP view, the C-arm was positioned so that an oblique view of the foramen as noted above was visualized. The target area is just inferior to the "nose of the scotty dog" or sub pedicular. The soft tissues overlying this structure were infiltrated with 2-3 ml. of 1% Lidocaine without Epinephrine.  The spinal needle was inserted toward the target using a "trajectory" view along the fluoroscope beam.  Under AP and lateral visualization, the needle was advanced so it did not puncture dura and was located close the 6 O'Clock position of the pedical in AP tracterory. Biplanar projections were used to confirm position. Aspiration was confirmed to be negative for CSF and/or blood. A 1-2 ml. volume of Isovue-250 was injected and flow of contrast was noted at each level. Radiographs were obtained for documentation purposes.   After attaining the desired flow of contrast documented above, a 0.5 to 1.0 ml test dose of 0.25% Marcaine was injected into each respective transforaminal space.  The patient was observed for 90 seconds post injection.  After no sensory deficits were reported, and normal lower extremity motor function was noted,   the above injectate was administered so that equal amounts of the injectate  were placed at each foramen (level) into the transforaminal epidural space.   Additional Comments:  The patient tolerated the procedure well Dressing: 2 x 2 sterile gauze and Band-Aid    Post-procedure details: Patient was observed during the procedure. Post-procedure instructions were  reviewed.  Patient left the clinic in stable condition.     Clinical History: MRI LUMBAR SPINE WITHOUT CONTRAST  TECHNIQUE: Multiplanar, multisequence MR imaging of the lumbar spine was performed. No intravenous contrast was administered.  COMPARISON:  Radiographs dated 03/18/2019 and lumbar MRI dated 02/22/2015  FINDINGS: Segmentation:  Standard.  Alignment:  Physiologic.  Vertebrae: No fracture, evidence of discitis, or bone lesion. Prominent degenerative changes of the vertebral endplates at C5-8, N2-7 and L4-5.  Conus medullaris and cauda equina: Conus extends to the L1-2 level. Conus and cauda equina appear normal.  Paraspinal and other soft tissues: Negative.  Disc levels:  L1-2: Normal.  L2-3: New disc degeneration with disc space narrowing, new degenerative changes of the vertebral endplates and a new broad-based disc bulge with an increased small protrusion into the left neural foramen which does have a mass effect upon the left L2 nerve lateral to the neural foramen, similar to the appearance on the prior exam. Small bilateral joint effusions, unchanged. Slight hypertrophy of the ligamentum flavum combine with the disc bulge create moderate spinal stenosis with compression of both lateral recesses, slightly increased since the prior study.  L3-4: Chronic severe degenerative disc disease with increased disc space narrowing and degenerative changes of the vertebral endplates. Small broad-based disc protrusion is new. Protrusions in the neural foramina are unchanged. Moderately severe spinal stenosis is new with slight compression of both lateral recesses which could affect the L4 nerve roots.  L4-5: Chronic disc space narrowing. Chronic broad-based bulge of the disc without focal neural impingement. Moderate left facet arthritis. No significant spinal stenosis or focal neural impingement.  L5-S1: Normal disc. No significant facet arthritis.  Tiny ganglion cyst adjacent to the anterior aspect of the left facet joint lateral to the left neural foramen without neural impingement.  IMPRESSION: 1. Progressive moderately severe spinal stenosis at L3-4 with slight compression of both lateral recesses which could affect the L4 nerve roots. 2. New soft disc protrusion into the left neural foramen at L2-3 which could affect the left L2 nerve lateral to the neural foramen. New moderate spinal stenosis at L2-3. 3. Moderate left facet arthritis at L4-5.   Electronically Signed   By: Lorriane Shire M.D.   On: 04/22/2019 09:53   She reports that she quit smoking about 9 years ago. She has never used smokeless tobacco.  Recent Labs    04/19/19 1014 01/20/20 1434  HGBA1C 7.3* 7.5*    Objective:  VS:  HT:    WT:   BMI:     BP:(!) 190/85  HR:75bpm  TEMP: ( )  RESP:  Physical Exam Constitutional:      General: She is not in acute distress.    Appearance: Normal appearance. She is not ill-appearing.  HENT:     Head: Normocephalic and atraumatic.     Right Ear: External ear normal.     Left Ear: External ear normal.  Eyes:     Extraocular Movements: Extraocular movements intact.  Cardiovascular:     Rate and Rhythm: Normal rate.     Pulses: Normal pulses.  Musculoskeletal:     Right lower leg: No edema.     Left lower leg: No edema.     Comments:  Patient has good distal strength with no pain over the greater trochanters.  No clonus or focal weakness.  Skin:    Findings: No erythema, lesion or rash.  Neurological:     General: No focal deficit present.     Mental Status: She is alert and oriented to person, place, and time.     Sensory: No sensory deficit.     Motor: No weakness or abnormal muscle tone.     Coordination: Coordination normal.  Psychiatric:        Mood and Affect: Mood normal.        Behavior: Behavior normal.     Ortho Exam  Imaging: No results found.  Past Medical/Family/Surgical/Social  History: Medications & Allergies reviewed per EMR, new medications updated. Patient Active Problem List   Diagnosis Date Noted  . Spinal stenosis of lumbar region 05/21/2019  . Complete tear of left rotator cuff 05/16/2019  . Uncontrolled type 2 diabetes mellitus with hyperglycemia ([place]) 12/19/2018  . Microalbuminuria 04/08/2018  . Genetic testing 09/25/2017  . Family history of breast cancer   . Family history of prostate cancer   . Malignant neoplasm of upper-outer quadrant of right breast in female, estrogen receptor positive ([place]) 06/26/2017  . Lumbar herniated disc 04/15/2015  . Abnormal nuclear stress test 04/03/2015  . 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 CAD (coronary artery disease), native coronary artery 03/30/2015  . S/P mitral valve repair 11/16/2010  . Dyslipidemia 06/29/2010  . Diabetes ([place]) 09/29/2006  . ANEMIA-IRON DEFICIENCY 09/29/2006  . Essential hypertension 09/29/2006   Past Medical History:  Diagnosis Date  . Abscess in epidural space of L2-L5 lumbar spine 03/2006  . Anemia, iron deficiency   . Arthritis   . Breast cancer ([place]) 06/12/2017   right breast  . CAD (coronary artery disease), native coronary artery 03/30/2015  . Cataract   . Colon polyp    a. Multiple colonic polyps status post colonoscopy in October 2007, consistent with tubular adenoma, tubulovillous adenoma with no high-grade dysplasia or malignancy identified.   . Constipation   . Coronary artery disease    a. s/p CABG 06/2010: S-D2; S-PDA (at time of MV surgery).  . Depression   . Diabetes mellitus    type II  . Family history of breast cancer   . Family history of prostate cancer   . Genetic testing 09/25/2017   Multi-Cancer panel (83 genes) @ [facility] - No pathogenic mutations detected  . GERD (gastroesophageal reflux disease)   . Hx of transient ischemic attack (TIA)    a. See stroke section.  . Hyperlipidemia   . Hypertension   . Hypoxia    a. Has history of acute  hypoxic respiratory failure in the setting of bronchitis/PNA or prior admissions.  [name] MRSA infection    a. History of recurrent skin infection and soft tissue abscesses, with MRSA positive in the past.  . 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 mitral valve - s/p resection and MV repair 06/2010 Dr. [name].  . 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 Personal history of radiation therapy   . Sleep apnea    does not use CPAP  . Stenosis of middle cerebral artery    a. Distal R MCA.  . Stroke [facility])    a. 11/2009: mitral mass diagnosed at this time, also has distal R MCA stenosis, tx with coumadin. b. Readmitted 05/2010 with TIA symptoms - had not been taking Coumadin. s/p MV surgery 06/2010. Coumadin stopped 2013  after review of chart by Dr. [name] since mass was removed (stroke felt possibly related to this).    Family History  Problem Relation Age of Onset  . Colon cancer Mother        dx 60s; deceased 21  . Irritable bowel syndrome Mother   . Prostate cancer Father        deceased 73s  . Cirrhosis Sister        died of GI bleed associated with cirrhosis of the liver  . Stroke Brother   . Prostate cancer Brother 42       deceased 29  . Diabetes Brother        x3  . Kidney disease Brother   . Cancer Brother 68       unk. type  . Breast cancer Cousin        several maternal 1st and 2nd cousins with breast cancer   Past Surgical History:  Procedure Laterality Date  . BREAST LUMPECTOMY Right   . BREAST LUMPECTOMY WITH RADIOACTIVE SEED AND SENTINEL LYMPH NODE BIOPSY Right 07/20/2017   Procedure: RIGHT BREAST LUMPECTOMY WITH RADIOACTIVE SEED AND SENTINEL LYMPH NODE BIOPSY;  Surgeon: Alphonsa Overall, MD;  Location: [place];  Service: General;  Laterality: Right;  . CARDIAC CATHETERIZATION  04/03/2015   Procedure: Left Heart Cath and Cors/Grafts Angiography;  Surgeon: Peter M Martinique, MD;  Location: [place] CV LAB;  Service: Cardiovascular;;  . CORONARY ARTERY BYPASS GRAFT  1/12    . history of ankle fractures requiring surgery    . LUMBAR LAMINECTOMY/DECOMPRESSION MICRODISCECTOMY Left 04/15/2015   Procedure: LUMBAR LAMINECTOMY/DECOMPRESSION MICRODISCECTOMY 1 LEVEL;  Surgeon: Newman Pies, MD;  Location: [place] [facility]UR[facility];  Service: Neurosurgery;  Laterality: Left;  Left L23 microdiskectomy  . MV repair and resection of mass  1/12  . RESECTION DISTAL CLAVICAL Left 05/16/2019   Procedure: RESECTION DISTAL CLAVICAL;  Surgeon: Mcarthur Rossetti, MD;  Location: [place];  Service: Orthopedics;  Laterality: Left;  . SHOULDER ARTHROSCOPY WITH ROTATOR CUFF REPAIR AND SUBACROMIAL DECOMPRESSION Left 05/16/2019   Procedure: SHOULDER ARTHROSCOPY WITH ROTATOR CUFF REPAIR AND SUBACROMIAL DECOMPRESSION;  Surgeon: Mcarthur Rossetti, MD;  Location: [place];  Service: Orthopedics;  Laterality: Left;  . TONSILLECTOMY  1971  . TOTAL ABDOMINAL HYSTERECTOMY  1978  . VESICOVAGINAL FISTULA CLOSURE W/ TAH     Social History   Occupational History    Employer: UNEMPLOYED  Tobacco Use  . Smoking status: Former Smoker    Quit date: 10/14/2010    Years since quitting: 9.3  . Smokeless tobacco: Never Used  Vaping Use  . Vaping Use: Never used  Substance and Sexual Activity  . Alcohol use: Yes    Comment: social  . Drug use: No  . Sexual activity: Not Currently

## 2020-01-23 NOTE — Progress Notes (Signed)
Pt States lower back pain that travel down both legs. Pt states she can cough and it makes her leg hurt. Pt states moving around makes the pain worse. Pt states rubbing cream helps a little with the pain.   Numeric Pain Rating Scale and Functional Assessment Average Pain 7   In the last MONTH (on 0-10 scale) has pain interfered with the following?  1. General activity like being  able to carry out your everyday physical activities such as walking, climbing stairs, carrying groceries, or moving a chair?  Rating(10)   +Driver, -BT, -Dye Allergies.

## 2020-02-04 ENCOUNTER — Ambulatory Visit (INDEPENDENT_AMBULATORY_CARE_PROVIDER_SITE_OTHER): Payer: Medicare PPO | Admitting: Orthopaedic Surgery

## 2020-02-04 ENCOUNTER — Ambulatory Visit: Payer: Self-pay

## 2020-02-04 ENCOUNTER — Encounter: Payer: Self-pay | Admitting: Orthopaedic Surgery

## 2020-02-04 VITALS — Ht 67.0 in | Wt 240.0 lb

## 2020-02-04 DIAGNOSIS — M5416 Radiculopathy, lumbar region: Secondary | ICD-10-CM

## 2020-02-04 DIAGNOSIS — M5136 Other intervertebral disc degeneration, lumbar region: Secondary | ICD-10-CM | POA: Diagnosis not present

## 2020-02-04 MED ORDER — GABAPENTIN 100 MG PO CAPS
ORAL_CAPSULE | ORAL | 0 refills | Status: DC
Start: 2020-02-04 — End: 2021-07-26

## 2020-02-04 NOTE — Addendum Note (Signed)
Addended by: [name] on: 02/04/2020 02:50 PM   Modules accepted: Orders

## 2020-02-04 NOTE — Progress Notes (Signed)
Office Visit Note   Patient: [name]           Date of Birth: 1950-08-30           MRN: [id number] Visit Date: 02/04/2020              Requested by: [name], MD [address] [place],  [place] [address] PCP: [name], MD   Assessment & Plan: Visit Diagnoses:  1. Radiculopathy, lumbar region   2. Other intervertebral disc degeneration, lumbar region     Plan: With patient's ongoing and worsening symptoms that have failed conservative treatment recommend repeating lumbar MRI scan and comparing to the study that was done October 2020.  She will follow-up with Dr. [name] after completion to discuss results and further treatment options.  With patient's cardiac and medical issues I advised her to be in contact with cardiologist and primary care physician to start the process of getting clearances there.  States that it has been a while since she has seen cardiologist.  Follow-Up Instructions: Return in about 3 weeks (around 02/25/2020) for Review lumbar MRI with Dr. [name] and discuss possible surgical intervention.   Orders:  Orders Placed This Encounter  Procedures  . XR Lumbar Spine Complete  . MR Lumbar Spine w/o contrast  . MR Lumbar Spine W Wo Contrast   No orders of the defined types were placed in this encounter.     Procedures: No procedures performed   Clinical Data: No additional findings.   Subjective: Chief Complaint  Patient presents with  . Right Leg - Weakness, Pain  . Left Leg - Weakness, Pain    HPI 69 year old black female with known history of multilevel lumbar spondylosis and lower extremity radiculopathy returns.  States that previous ESI with Dr. Ernestina Patches gave 75% improvement for a couple of days but then symptoms returned.  Current problem is having a significant negative impact on her quality of life.  Wants to know what can be done. Review of Systems No current cardiac pulmonary GI GU  issues  Objective: Vital Signs: Ht 5\' 7"  (1.702 m)   Wt 240 lb (108.9 kg)   BMI 37.59 kg/m   Physical Exam Patient sitting in wheelchair. Ortho Exam  Specialty Comments:  No specialty comments available.  Imaging: No results found.   PMFS History: Patient Active Problem List   Diagnosis Date Noted  . Spinal stenosis of lumbar region 05/21/2019  . Complete tear of left rotator cuff 05/16/2019  . Uncontrolled type 2 diabetes mellitus with hyperglycemia ([place]) 12/19/2018  . Microalbuminuria 04/08/2018  . Genetic testing 09/25/2017  . Family history of breast cancer   . Family history of prostate cancer   . Malignant neoplasm of upper-outer quadrant of right breast in female, estrogen receptor positive ([place]) 06/26/2017  . Lumbar herniated disc 04/15/2015  . Abnormal nuclear stress test 04/03/2015  . 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 CAD (coronary artery disease), native coronary artery 03/30/2015  . S/P mitral valve repair 11/16/2010  . Dyslipidemia 06/29/2010  . Diabetes ([place]) 09/29/2006  . ANEMIA-IRON DEFICIENCY 09/29/2006  . Essential hypertension 09/29/2006   Past Medical History:  Diagnosis Date  . Abscess in epidural space of L2-L5 lumbar spine 03/2006  . Anemia, iron deficiency   . Arthritis   . Breast cancer ([place]) 06/12/2017   right breast  . CAD (coronary artery disease), native coronary artery 03/30/2015  . Cataract   . Colon polyp    a.  Multiple colonic polyps status post colonoscopy in October 2007, consistent with tubular adenoma, tubulovillous adenoma with no high-grade dysplasia or malignancy identified.   . Constipation   . Coronary artery disease    a. s/p CABG 06/2010: S-D2; S-PDA (at time of MV surgery).  . Depression   . Diabetes mellitus    type II  . Family history of breast cancer   . Family history of prostate cancer   . Genetic testing 09/25/2017   Multi-Cancer panel (83 genes) @ [facility] - No pathogenic mutations detected  .  GERD (gastroesophageal reflux disease)   . Hx of transient ischemic attack (TIA)    a. See stroke section.  . Hyperlipidemia   . Hypertension   . Hypoxia    a. Has history of acute hypoxic respiratory failure in the setting of bronchitis/PNA or prior admissions.  [name] MRSA infection    a. History of recurrent skin infection and soft tissue abscesses, with MRSA positive in the past.  . 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 mitral valve - s/p resection and MV repair 06/2010 Dr. [name].  . 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 Personal history of radiation therapy   . Sleep apnea    does not use CPAP  . Stenosis of middle cerebral artery    a. Distal R MCA.  . Stroke [facility])    a. 11/2009: mitral mass diagnosed at this time, also has distal R MCA stenosis, tx with coumadin. b. Readmitted 05/2010 with TIA symptoms - had not been taking Coumadin. s/p MV surgery 06/2010. Coumadin stopped 2013 after review of chart by Dr. [name] since mass was removed (stroke felt possibly related to this).     Family History  Problem Relation Age of Onset  . Colon cancer Mother        dx 29s; deceased 60  . Irritable bowel syndrome Mother   . Prostate cancer Father        deceased 64s  . Cirrhosis Sister        died of GI bleed associated with cirrhosis of the liver  . Stroke Brother   . Prostate cancer Brother 59       deceased 14  . Diabetes Brother        x3  . Kidney disease Brother   . Cancer Brother 22       unk. type  . Breast cancer Cousin        several maternal 1st and 2nd cousins with breast cancer    Past Surgical History:  Procedure Laterality Date  . BREAST LUMPECTOMY Right   . BREAST LUMPECTOMY WITH RADIOACTIVE SEED AND SENTINEL LYMPH NODE BIOPSY Right 07/20/2017   Procedure: RIGHT BREAST LUMPECTOMY WITH RADIOACTIVE SEED AND SENTINEL LYMPH NODE BIOPSY;  Surgeon: Alphonsa Overall, MD;  Location: [place];  Service: General;  Laterality: Right;  . CARDIAC CATHETERIZATION  04/03/2015    Procedure: Left Heart Cath and Cors/Grafts Angiography;  Surgeon: Peter M Martinique, MD;  Location: [place] CV LAB;  Service: Cardiovascular;;  . CORONARY ARTERY BYPASS GRAFT  1/12  . history of ankle fractures requiring surgery    . LUMBAR LAMINECTOMY/DECOMPRESSION MICRODISCECTOMY Left 04/15/2015   Procedure: LUMBAR LAMINECTOMY/DECOMPRESSION MICRODISCECTOMY 1 LEVEL;  Surgeon: Newman Pies, MD;  Location: [place] NEUR[facility] ORS;  Service: Neurosurgery;  Laterality: Left;  Left L23 microdiskectomy  . MV repair and resection of mass  1/12  . RESECTION DISTAL CLAVICAL Left 05/16/2019   Procedure: RESECTION DISTAL CLAVICAL;  Surgeon: Ninfa Linden,  Lind Guest, MD;  Location: [place];  Service: Orthopedics;  Laterality: Left;  . SHOULDER ARTHROSCOPY WITH ROTATOR CUFF REPAIR AND SUBACROMIAL DECOMPRESSION Left 05/16/2019   Procedure: SHOULDER ARTHROSCOPY WITH ROTATOR CUFF REPAIR AND SUBACROMIAL DECOMPRESSION;  Surgeon: Mcarthur Rossetti, MD;  Location: [place];  Service: Orthopedics;  Laterality: Left;  . TONSILLECTOMY  1971  . TOTAL ABDOMINAL HYSTERECTOMY  1978  . VESICOVAGINAL FISTULA CLOSURE W/ TAH     Social History   Occupational History    Employer: UNEMPLOYED  Tobacco Use  . Smoking status: Former Smoker    Quit date: 10/14/2010    Years since quitting: 9.3  . Smokeless tobacco: Never Used  Vaping Use  . Vaping Use: Never used  Substance and Sexual Activity  . Alcohol use: Yes    Comment: social  . Drug use: No  . Sexual activity: Not Currently

## 2020-02-06 ENCOUNTER — Telehealth: Payer: Self-pay

## 2020-02-06 NOTE — Telephone Encounter (Signed)
°  Patient Consent for Virtual Visit         [name] has provided verbal consent on 02/06/2020 for a virtual visit (video or telephone).   CONSENT FOR VIRTUAL VISIT FOR:  [name]  By participating in this virtual visit I agree to the following:  I hereby voluntarily request, consent and authorize [facility] and its employed or contracted physicians, physician assistants, nurse practitioners or other licensed health care professionals (the Practitioner), to provide me with telemedicine health care services (the Services") as deemed necessary by the treating Practitioner. I acknowledge and consent to receive the Services by the Practitioner via telemedicine. I understand that the telemedicine visit will involve communicating with the Practitioner through live audiovisual communication technology and the disclosure of certain medical information by electronic transmission. I acknowledge that I have been given the opportunity to request an in-person assessment or other available alternative prior to the telemedicine visit and am voluntarily participating in the telemedicine visit.  I understand that I have the right to withhold or withdraw my consent to the use of telemedicine in the course of my care at any time, without affecting my right to future care or treatment, and that the Practitioner or I may terminate the telemedicine visit at any time. I understand that I have the right to inspect all information obtained and/or recorded in the course of the telemedicine visit and may receive copies of available information for a reasonable fee.  I understand that some of the potential risks of receiving the Services via telemedicine include:   Delay or interruption in medical evaluation due to technological equipment failure or disruption;  Information transmitted may not be sufficient (e.g. poor resolution of images) to allow for appropriate medical decision making by  the Practitioner; and/or   In rare instances, security protocols could fail, causing a breach of personal health information.  Furthermore, I acknowledge that it is my responsibility to provide information about my medical history, conditions and care that is complete and accurate to the best of my ability. I acknowledge that Practitioner's advice, recommendations, and/or decision may be based on factors not within their control, such as incomplete or inaccurate data provided by me or distortions of diagnostic images or specimens that may result from electronic transmissions. I understand that the practice of medicine is not an exact science and that Practitioner makes no warranties or guarantees regarding treatment outcomes. I acknowledge that a copy of this consent can be made available to me via my patient portal ([name]), or I can request a printed copy by calling the office of [name].    I understand that my insurance will be billed for this visit.   I have read or had this consent read to me.  I understand the contents of this consent, which adequately explains the benefits and risks of the Services being provided via telemedicine.   I have been provided ample opportunity to ask questions regarding this consent and the Services and have had my questions answered to my satisfaction.  I give my informed consent for the services to be provided through the use of telemedicine in my medical care

## 2020-02-07 ENCOUNTER — Ambulatory Visit: Payer: Medicare PPO | Admitting: Orthopaedic Surgery

## 2020-02-07 ENCOUNTER — Telehealth (INDEPENDENT_AMBULATORY_CARE_PROVIDER_SITE_OTHER): Payer: Medicare PPO | Admitting: Cardiology

## 2020-02-07 ENCOUNTER — Other Ambulatory Visit: Payer: Self-pay

## 2020-02-07 VITALS — BP 179/77 | HR 78 | Ht 67.0 in | Wt 240.0 lb

## 2020-02-07 DIAGNOSIS — I350 Nonrheumatic aortic (valve) stenosis: Secondary | ICD-10-CM | POA: Diagnosis not present

## 2020-02-07 DIAGNOSIS — I10 Essential (primary) hypertension: Secondary | ICD-10-CM | POA: Diagnosis not present

## 2020-02-07 DIAGNOSIS — D151 Benign neoplasm of heart: Secondary | ICD-10-CM | POA: Diagnosis not present

## 2020-02-07 DIAGNOSIS — I2510 Atherosclerotic heart disease of native coronary artery without angina pectoris: Secondary | ICD-10-CM

## 2020-02-07 MED ORDER — CARVEDILOL 12.5 MG PO TABS
12.5000 mg | ORAL_TABLET | Freq: Two times a day (BID) | ORAL | 3 refills | Status: DC
Start: 2020-02-07 — End: 2021-04-27

## 2020-02-07 NOTE — Addendum Note (Signed)
Addended by: Antonieta Iba on: 02/07/2020 02:06 PM   Modules accepted: Orders

## 2020-02-07 NOTE — Progress Notes (Signed)
Cardiology Office Note   Date:  02/07/2020   ID:  Stacy, Greene 09-15-1950, MRN [id number]  PCP:  Stacy Pier, MD    Chief Complaint  Patient presents with  . Coronary Artery Disease  . Hypertension  . Hyperlipidemia     History of Present Illness: [name] is a 69 y.o. 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 Roxy Greene in 2012.  She has a history of HTN, DM, dyslipidemia and TIA.  Her last cath showed severe 2 vessel CAD with patent SVG>PDA and SVG>D2 with normal LVF.    SHe is here today for followup and is doing well from a cardiac standpoint.  She recently tore her rotator cuff and is going to need shoulder surgery and needs preop cardiac clearance.  She denies any chest pain or pressure, SOB, DOE, PND, orthopnea, LE edema, dizziness, palpitations or syncope. She is compliant with her meds and is tolerating meds with no SE.  She does not exercise much due to chronic back pain but is able to walk up a flight of stairs without SOB or CP.  She cannot walk a lot due to chronic back pain and housework.   Past Medical History:  Diagnosis Date  . Abscess in epidural space of L2-L5 lumbar spine 03/2006  . Anemia, iron deficiency   . Arthritis   . Breast cancer ([place]) 06/12/2017   right breast  . CAD (coronary artery disease), native coronary artery 03/30/2015  . Cataract   . Colon polyp    a. Multiple colonic polyps status post colonoscopy in October 2007, consistent with tubular adenoma, tubulovillous adenoma with no high-grade dysplasia or malignancy identified.   . Constipation   . Coronary artery disease    a. s/p CABG 06/2010: S-D2; S-PDA (at time of MV surgery).  . Depression   . Diabetes mellitus    type II  . Family history of breast cancer   . Family history of prostate cancer   . Genetic testing  09/25/2017   Multi-Cancer panel (83 genes) @ [facility] - No pathogenic mutations detected  . GERD (gastroesophageal reflux disease)   . Hx of transient ischemic attack (TIA)    a. See stroke section.  . Hyperlipidemia   . Hypertension   . Hypoxia    a. Has history of acute hypoxic respiratory failure in the setting of bronchitis/PNA or prior admissions.  [name] MRSA infection    a. History of recurrent skin infection and soft tissue abscesses, with MRSA positive in the past.  . 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 mitral valve - s/p resection and MV repair 06/2010 Dr. Roxy Greene.  . 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 Personal history of radiation therapy   . Sleep apnea    does not use CPAP  . Stenosis of middle cerebral artery    a. Distal R MCA.  . Stroke [facility])    a. 11/2009: mitral mass diagnosed at this time, also has distal R MCA stenosis, tx with coumadin. b. Readmitted 05/2010 with TIA symptoms - had not been taking Coumadin. s/p MV surgery 06/2010. Coumadin stopped 2013 after review of chart by Dr. Stanford Greene since mass was removed (stroke felt possibly related to this).     Past  Surgical History:  Procedure Laterality Date  . BREAST LUMPECTOMY Right   . BREAST LUMPECTOMY WITH RADIOACTIVE SEED AND SENTINEL LYMPH NODE BIOPSY Right 07/20/2017   Procedure: RIGHT BREAST LUMPECTOMY WITH RADIOACTIVE SEED AND SENTINEL LYMPH NODE BIOPSY;  Surgeon: Alphonsa Overall, MD;  Location: [facility];  Service: General;  Laterality: Right;  . CARDIAC CATHETERIZATION  04/03/2015   Procedure: Left Heart Cath and Cors/Grafts Angiography;  Surgeon: Peter M Martinique, MD;  Location: [facility] CV [facility];  Service: Cardiovascular;;  . CORONARY ARTERY BYPASS GRAFT  1/12  . history of ankle fractures requiring surgery    . LUMBAR LAMINECTOMY/DECOMPRESSION MICRODISCECTOMY Left 04/15/2015   Procedure: LUMBAR LAMINECTOMY/DECOMPRESSION MICRODISCECTOMY 1 LEVEL;  Surgeon: Newman Pies, MD;  Location: [facility];   Service: Neurosurgery;  Laterality: Left;  Left L23 microdiskectomy  . MV repair and resection of mass  1/12  . RESECTION DISTAL CLAVICAL Left 05/16/2019   Procedure: RESECTION DISTAL CLAVICAL;  Surgeon: Mcarthur Rossetti, MD;  Location: [facility];  Service: Orthopedics;  Laterality: Left;  . SHOULDER ARTHROSCOPY WITH ROTATOR CUFF REPAIR AND SUBACROMIAL DECOMPRESSION Left 05/16/2019   Procedure: SHOULDER ARTHROSCOPY WITH ROTATOR CUFF REPAIR AND SUBACROMIAL DECOMPRESSION;  Surgeon: Mcarthur Rossetti, MD;  Location: [facility];  Service: Orthopedics;  Laterality: Left;  . TONSILLECTOMY  1971  . TOTAL ABDOMINAL HYSTERECTOMY  1978  . VESICOVAGINAL FISTULA CLOSURE W/ TAH       Current Outpatient Medications  Medication Sig Dispense Refill  . ACCU-CHEK SOFTCLIX LANCETS lancets Use as instructed 100 each 12  . albuterol (PROVENTIL HFA;VENTOLIN HFA) 108 (90 Base) MCG/ACT inhaler Inhale 2 puffs into the lungs every 6 (six) hours as needed for wheezing or shortness of breath. 1 Inhaler 2  . amLODipine (NORVASC) 10 MG tablet TAKE 1 TABLET (10 MG TOTAL) BY MOUTH DAILY. 90 tablet 0  . aspirin EC 81 MG tablet Take 1 tablet (81 mg total) by mouth daily. 30 tablet 5  . atorvastatin (LIPITOR) 80 MG tablet Take 1 tablet (80 mg total) by mouth daily. 90 tablet 1  . baclofen (LIORESAL) 10 MG tablet Take 0.5-1 tablets (5-10 mg total) by mouth 3 (three) times daily as needed for muscle spasms. 60 each 3  . Blood Glucose Monitoring Suppl (ACCU-CHEK AVIVA PLUS) w/Device KIT Use as directed 1 kit 0  . buPROPion (WELLBUTRIN XL) 150 MG 24 hr tablet Take 1 tablet (150 mg total) by mouth 2 (two) times daily. 180 tablet 2  . carvedilol (COREG) 6.25 MG tablet Take 1 tablet (6.25 mg total) by mouth 2 (two) times daily with a meal. 180 tablet 3  . colchicine 0.6 MG tablet Take 0.6 mg by mouth daily.    . diclofenac Sodium (VOLTAREN) 1 % GEL APPLY 2 GRAMS TOPICALLY 4 (FOUR) TIMES  DAILY AS NEEDED. 100 g 0  . ferrous sulfate (FEROSUL) 325 (65 FE) MG tablet TAKE 1 TABLET (325 MG TOTAL) BY MOUTH DAILY WITH BREAKFAST. 90 tablet 1  . gabapentin (NEURONTIN) 100 MG capsule Take one at night for a few days then increase to one twice a day. 60 capsule 0  . glipiZIDE (GLUCOTROL) 5 MG tablet TAKE 1 TABLET DAILY BEFORE BREAKFAST 90 tablet 0  . glucose blood (ACCU-CHEK AVIVA PLUS) test strip Use as instructed 100 each 12  . hydrochlorothiazide (HYDRODIURIL) 25 MG tablet TAKE 1 TABLET EVERY DAY 90 tablet 0  . letrozole (FEMARA) 2.5 MG tablet Take 1 tablet (2.5 mg total) by mouth daily. 90 tablet  3  . linagliptin (TRADJENTA) 5 MG TABS tablet Take 1 tablet (5 mg total) by mouth daily. 90 tablet 1  . losartan (COZAAR) 100 MG tablet TAKE 1 TABLET EVERY DAY 90 tablet 0  . metFORMIN (GLUCOPHAGE) 1000 MG tablet TAKE 1 TABLET TWICE DAILY WITH MEALS 180 tablet 0  . pantoprazole (PROTONIX) 40 MG tablet TAKE 1 TABLET EVERY DAY 90 tablet 3  . polyethylene glycol (MIRALAX / GLYCOLAX) 17 g packet Take 17 g by mouth daily as needed for mild constipation or moderate constipation. 30 each 5  . traZODone (DESYREL) 50 MG tablet TAKE 1 TABLET AT BEDTIME AS NEEDED FOR SLEEP 90 tablet 0   No current facility-administered medications for this visit.    Allergies:   Patient has no known allergies.    Social History:  The patient  reports that she quit smoking about 9 years ago. She has never used smokeless tobacco. She reports current alcohol use. She reports that she does not use drugs.   Family History:  The patient's family history includes Breast cancer in her cousin; Cancer (age of onset: 72) in her brother; Cirrhosis in her sister; Colon cancer in her mother; Diabetes in her brother; Irritable bowel syndrome in her mother; Kidney disease in her brother; Prostate cancer in her father; Prostate cancer (age of onset: 34) in her brother; Stroke in her brother.    ROS:  Please see the history of present  illness.   Otherwise, review of systems are positive for none.   All other systems are reviewed and negative.    PHYSICAL EXAM: VS:  179/28mHg HR 78  EKG:  EKG is not ordered today.  Recent Labs: 04/19/2019: Hemoglobin 11.6; Platelets 279 05/13/2019: BUN 16; Creatinine, Ser 1.09; Potassium 5.4; Sodium 140 01/20/2020: ALT 8    Lipid Panel    Component Value Date/Time   CHOL 206 (H) 01/20/2020 1434   TRIG 140 01/20/2020 1434   HDL 82 01/20/2020 1434   CHOLHDL 2.5 01/20/2020 1434   CHOLHDL 2.8 04/28/2016 1028   VLDL 19 04/28/2016 1028   LDLCALC 100 (H) 01/20/2020 1434   LDLDIRECT 101.7 07/18/2011 0901      Wt Readings from Last 3 Encounters:  02/07/20 240 lb (108.9 kg)  02/04/20 240 lb (108.9 kg)  01/20/20 (!) 240 lb 12.8 oz (109.2 kg)      ASSESSMENT AND PLAN:  1. ASCAD  -s/p CABG with SVG to D2 and SVG to PDA with no angina.   -he has not had any anginal symptoms -Continue ASA/statin  4.  Papillary fibroelastoma of the MV  -s/p remote resection and MV repair.  -last echo was 2016 showing normal MV repair -repeat 2D echo  3.  HTN  -Bp elevated -continue amlodipine 145mdaily,  Losartan 10012maily, HCTZ 10m81mily -increase Carvedilol to 12.5mg 61m -encouraged her to cut out added Na in her diet -I have asked her to check her BP daily for a week and call with results  4.  Aortic stenosis -mild by echo in 2016 -repeat echo  5.  Preoperative cardiac clearance -she is able to walk up a flight of steps but gets very tired -her activity is limited by severe back pain -I think we should proceed with LexisLeane Callr to cardiac clearance -her revised cardiac risk is 6.6% of periop risk factor of major cardiac event    Current medicines are reviewed at length with the patient today.  The patient does not have concerns regarding medicines.  The following changes have been made:  no change  Labs/ tests ordered today: See above Assessment and Plan No  orders of the defined types were placed in this encounter.    Disposition:   FU in 1 year  Signed, Fransico Him, MD  02/07/2020 1:24 PM    [facility] [address], [place], [place]  [address] Phone: [phone]; Fax: [phone]

## 2020-02-07 NOTE — Patient Instructions (Signed)
Please check your blood pressure daily for one week and call us with the readings.   Medication Instructions:  Your physician has recommended you make the following change in your medication:  1) INCREASE carvedilol to 12.5 mg twice daily  *If you need a refill on your cardiac medications before your next appointment, please call your pharmacy*  Testing/Procedures: Your physician has requested that you have a lexiscan myoview. For further information please visit [email or web address]. Please follow instruction sheet, as given.  Your physician has requested that you have an echocardiogram. Echocardiography is a painless test that uses sound waves to create images of your heart. It provides your doctor with information about the size and shape of your heart and how well your heart's chambers and valves are working. This procedure takes approximately one hour. There are no restrictions for this procedure.  Follow-Up: At [facility], you and your health needs are our priority.  As part of our continuing mission to provide you with exceptional heart care, we have created designated Provider Care Teams.  These Care Teams include your primary Cardiologist (physician) and Advanced Practice Providers (APPs -  Physician Assistants and Nurse Practitioners) who all work together to provide you with the care you need, when you need it.  We recommend signing up for the patient portal called "MyChart".  Sign up information is provided on this After Visit Summary.  MyChart is used to connect with patients for Virtual Visits (Telemedicine).  Patients are able to view lab/test results, encounter notes, upcoming appointments, etc.  Non-urgent messages can be sent to your provider as well.   To learn more about what you can do with MyChart, go to [email or web address].    Your next appointment:   1 year(s)  The format for your next appointment:   In Person  Provider:   You may see Fransico Him, MD or one of  the following Advanced Practice Providers on your designated Care Team:    Melina Copa, PA-C  Ermalinda Barrios, PA-C    Other Instructions  Low-Sodium Eating Plan Sodium, which is an element that makes up salt, helps you maintain a healthy balance of fluids in your body. Too much sodium can increase your blood pressure and cause fluid and waste to be held in your body. Your health care provider or dietitian may recommend following this plan if you have high blood pressure (hypertension), kidney disease, liver disease, or heart failure. Eating less sodium can help lower your blood pressure, reduce swelling, and protect your heart, liver, and kidneys. What are tips for following this plan? General guidelines  Most people on this plan should limit their sodium intake to 1,500-2,000 mg (milligrams) of sodium each day. Reading food labels   The Nutrition Facts label lists the amount of sodium in one serving of the food. If you eat more than one serving, you must multiply the listed amount of sodium by the number of servings.  Choose foods with less than 140 mg of sodium per serving.  Avoid foods with 300 mg of sodium or more per serving. Shopping  Look for lower-sodium products, often labeled as "low-sodium" or "no salt added."  Always check the sodium content even if foods are labeled as "unsalted" or "no salt added".  Buy fresh foods. ? Avoid canned foods and premade or frozen meals. ? Avoid canned, cured, or processed meats  Buy breads that have less than 80 mg of sodium per slice. Cooking  Eat more home-cooked food and less restaurant,  buffet, and fast food.  Avoid adding salt when cooking. Use salt-free seasonings or herbs instead of table salt or sea salt. Check with your health care provider or pharmacist before using salt substitutes.  Cook with plant-based oils, such as canola, sunflower, or olive oil. Meal planning  When eating at a restaurant, ask that your food be  prepared with less salt or no salt, if possible.  Avoid foods that contain MSG (monosodium glutamate). MSG is sometimes added to [place] food, bouillon, and some canned foods. What foods are recommended? The items listed may not be a complete list. Talk with your dietitian about what dietary choices are best for you. Grains Low-sodium cereals, including oats, puffed wheat and rice, and shredded wheat. Low-sodium crackers. Unsalted rice. Unsalted pasta. Low-sodium bread. Whole-grain breads and whole-grain pasta. Vegetables Fresh or frozen vegetables. "No salt added" canned vegetables. "No salt added" tomato sauce and paste. Low-sodium or reduced-sodium tomato and vegetable juice. Fruits Fresh, frozen, or canned fruit. Fruit juice. Meats and other protein foods Fresh or frozen (no salt added) meat, poultry, seafood, and fish. Low-sodium canned tuna and salmon. Unsalted nuts. Dried peas, beans, and lentils without added salt. Unsalted canned beans. Eggs. Unsalted nut butters. Dairy Milk. Soy milk. Cheese that is naturally low in sodium, such as ricotta cheese, fresh mozzarella, or Swiss cheese Low-sodium or reduced-sodium cheese. Cream cheese. Yogurt. Fats and oils Unsalted butter. Unsalted margarine with no trans fat. Vegetable oils such as canola or olive oils. Seasonings and other foods Fresh and dried herbs and spices. Salt-free seasonings. Low-sodium mustard and ketchup. Sodium-free salad dressing. Sodium-free light mayonnaise. Fresh or refrigerated horseradish. Lemon juice. Vinegar. Homemade, reduced-sodium, or low-sodium soups. Unsalted popcorn and pretzels. Low-salt or salt-free chips. What foods are not recommended? The items listed may not be a complete list. Talk with your dietitian about what dietary choices are best for you. Grains Instant hot cereals. Bread stuffing, pancake, and biscuit mixes. Croutons. Seasoned rice or pasta mixes. Noodle soup cups. Boxed or frozen macaroni and  cheese. Regular salted crackers. Self-rising flour. Vegetables Sauerkraut, pickled vegetables, and relishes. Olives. [place] fries. Onion rings. Regular canned vegetables (not low-sodium or reduced-sodium). Regular canned tomato sauce and paste (not low-sodium or reduced-sodium). Regular tomato and vegetable juice (not low-sodium or reduced-sodium). Frozen vegetables in sauces. Meats and other protein foods Meat or fish that is salted, canned, smoked, spiced, or pickled. Bacon, ham, sausage, hotdogs, corned beef, chipped beef, packaged lunch meats, salt pork, jerky, pickled herring, anchovies, regular canned tuna, sardines, salted nuts. Dairy Processed cheese and cheese spreads. Cheese curds. Blue cheese. Feta cheese. String cheese. Regular cottage cheese. Buttermilk. Canned milk. Fats and oils Salted butter. Regular margarine. Ghee. Bacon fat. Seasonings and other foods Onion salt, garlic salt, seasoned salt, table salt, and sea salt. Canned and packaged gravies. Worcestershire sauce. Tartar sauce. Barbecue sauce. Teriyaki sauce. Soy sauce, including reduced-sodium. Steak sauce. Fish sauce. Oyster sauce. Cocktail sauce. Horseradish that you find on the shelf. Regular ketchup and mustard. Meat flavorings and tenderizers. Bouillon cubes. Hot sauce and Tabasco sauce. Premade or packaged marinades. Premade or packaged taco seasonings. Relishes. Regular salad dressings. Salsa. Potato and tortilla chips. Corn chips and puffs. Salted popcorn and pretzels. Canned or dried soups. Pizza. Frozen entrees and pot pies. Summary  Eating less sodium can help lower your blood pressure, reduce swelling, and protect your heart, liver, and kidneys.  Most people on this plan should limit their sodium intake to 1,500-2,000 mg (milligrams) of sodium each day.  Canned, boxed, and frozen foods are high in sodium. Restaurant foods, fast foods, and pizza are also very high in sodium. You also get sodium by adding salt to  food.  Try to cook at home, eat more fresh fruits and vegetables, and eat less fast food, canned, processed, or prepared foods. This information is not intended to replace advice given to you by your health care provider. Make sure you discuss any questions you have with your health care provider. Document Revised: 05/26/2017 Document Reviewed: 06/06/2016 Elsevier Patient Education  2020 [employer].

## 2020-02-11 ENCOUNTER — Encounter: Payer: Self-pay | Admitting: Orthopaedic Surgery

## 2020-02-11 ENCOUNTER — Ambulatory Visit (INDEPENDENT_AMBULATORY_CARE_PROVIDER_SITE_OTHER): Payer: Medicare PPO | Admitting: Orthopaedic Surgery

## 2020-02-11 VITALS — Ht 67.0 in | Wt 240.0 lb

## 2020-02-11 DIAGNOSIS — M5126 Other intervertebral disc displacement, lumbar region: Secondary | ICD-10-CM

## 2020-02-11 NOTE — Progress Notes (Signed)
Office Visit Note   Patient: [name]           Date of Birth: [birth year]           MRN: [id number] Visit Date: 02/11/2020              Requested by: [name], MD [address] [place],  [place] [id number] PCP: [name], MD   Assessment & Plan: Visit Diagnoses:  1. Lumbar herniated disc     Plan: Patient is already scheduled for her lumbar MRI she can return as scheduled after MRI scan.  Previous MRI scan 04/22/2019 was reviewed with her today.  We discussed the stenosis is present and moderate severe stenosis at L3-4.  Follow-Up Instructions: Return in about 4 weeks (around 03/10/2020).   Orders:  No orders of the defined types were placed in this encounter.  No orders of the defined types were placed in this encounter.     Procedures: No procedures performed   Clinical Data: No additional findings.   Subjective: Chief Complaint  Patient presents with  . Lower Back - Pain, Follow-up    HPI 69 year old female returns she is scheduled for MRI scan lumbar 02/28/2020.  She states she has a cardiac stress test scheduled for 8/31.  She states she had problems with her leg giving way on Friday she rested for a few days she states she can walk with a cane today gabapentin seems to helped her to some degree.  She states she knows she probably has see her cardiologist to get cleared in case the MRI suggest she may need surgery to fix her leg giving way problem.  Review of Systems other systems unchanged from 02/04/2020 office visit.   Objective: Vital Signs: Ht 5\' 7"  (1.702 m)   Wt 240 lb (108.9 kg)   BMI 37.59 kg/m   Physical Exam Constitutional:      Appearance: She is well-developed.  HENT:     Head: Normocephalic.     Right Ear: External ear normal.     Left Ear: External ear normal.  Eyes:     Pupils: Pupils are equal, round, and reactive to light.  Neck:     Thyroid: No thyromegaly.     Trachea: No tracheal deviation.    Cardiovascular:     Rate and Rhythm: Normal rate.  Pulmonary:     Effort: Pulmonary effort is normal.  Abdominal:     Palpations: Abdomen is soft.  Skin:    General: Skin is warm and dry.  Neurological:     Mental Status: She is alert and oriented to person, place, and time.  Psychiatric:        Behavior: Behavior normal.     Ortho Exam lumbar area previous ESI looks good.  Negative logroll to the hips.  She has some pain with straight leg raising.  Patient is ambulating with a cane.  Mild valgus right knee crepitus with knee extension worse on the right than left.  Right left quad though shows some decreased strength.  No hip flexion weakness.  Distal pulses palpable.  Specialty Comments:  No specialty comments available.  Imaging: No results found.   PMFS History: Patient Active Problem List   Diagnosis Date Noted  . Spinal stenosis of lumbar region 05/21/2019  . Complete tear of left rotator cuff 05/16/2019  . Uncontrolled type 2 diabetes mellitus with hyperglycemia ([place]) 12/19/2018  . Microalbuminuria 04/08/2018  . Genetic testing 09/25/2017  .  Family history of breast cancer   . Family history of prostate cancer   . Malignant neoplasm of upper-outer quadrant of right breast in female, estrogen receptor positive ([place]) 06/26/2017  . Lumbar herniated disc 04/15/2015  . Abnormal nuclear stress test 04/03/2015  . 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 CAD (coronary artery disease), native coronary artery 03/30/2015  . S/P mitral valve repair 11/16/2010  . Dyslipidemia 06/29/2010  . Diabetes ([place]) 09/29/2006  . ANEMIA-IRON DEFICIENCY 09/29/2006  . Essential hypertension 09/29/2006   Past Medical History:  Diagnosis Date  . Abscess in epidural space of L2-L5 lumbar spine 03/2006  . Anemia, iron deficiency   . Arthritis   . Breast cancer ([place]) 06/12/2017   right breast  . CAD (coronary artery disease), native coronary artery 03/30/2015  . Cataract   . Colon  polyp    a. Multiple colonic polyps status post colonoscopy in October 2007, consistent with tubular adenoma, tubulovillous adenoma with no high-grade dysplasia or malignancy identified.   . Constipation   . Coronary artery disease    a. s/p CABG 06/2010: S-D2; S-PDA (at time of MV surgery).  . Depression   . Diabetes mellitus    type II  . Family history of breast cancer   . Family history of prostate cancer   . Genetic testing 09/25/2017   Multi-Cancer panel (83 genes) @ [facility] - No pathogenic mutations detected  . GERD (gastroesophageal reflux disease)   . Hx of transient ischemic attack (TIA)    a. See stroke section.  . Hyperlipidemia   . Hypertension   . Hypoxia    a. Has history of acute hypoxic respiratory failure in the setting of bronchitis/PNA or prior admissions.  [name] MRSA infection    a. History of recurrent skin infection and soft tissue abscesses, with MRSA positive in the past.  . 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 mitral valve - s/p resection and MV repair 06/2010 Dr. [name].  . 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 Personal history of radiation therapy   . Sleep apnea    does not use CPAP  . Stenosis of middle cerebral artery    a. Distal R MCA.  . Stroke [facility])    a. 11/2009: mitral mass diagnosed at this time, also has distal R MCA stenosis, tx with coumadin. b. Readmitted 05/2010 with TIA symptoms - had not been taking Coumadin. s/p MV surgery 06/2010. Coumadin stopped 2013 after review of chart by Dr. [name] since mass was removed (stroke felt possibly related to this).     Family History  Problem Relation Age of Onset  . Colon cancer Mother        dx 67s; deceased 56  . Irritable bowel syndrome Mother   . Prostate cancer Father        deceased 78s  . Cirrhosis Sister        died of GI bleed associated with cirrhosis of the liver  . Stroke Brother   . Prostate cancer Brother 13       deceased 29  . Diabetes Brother        x3  . Kidney  disease Brother   . Cancer Brother 13       unk. type  . Breast cancer Cousin        several maternal 1st and 2nd cousins with breast cancer    Past Surgical History:  Procedure Laterality Date  . BREAST LUMPECTOMY Right   . BREAST LUMPECTOMY  WITH RADIOACTIVE SEED AND SENTINEL LYMPH NODE BIOPSY Right 07/20/2017   Procedure: RIGHT BREAST LUMPECTOMY WITH RADIOACTIVE SEED AND SENTINEL LYMPH NODE BIOPSY;  Surgeon: Alphonsa Overall, MD;  Location: [facility];  Service: General;  Laterality: Right;  . CARDIAC CATHETERIZATION  04/03/2015   Procedure: Left Heart Cath and Cors/Grafts Angiography;  Surgeon: Peter M Martinique, MD;  Location: [place] [facility] CV LAB;  Service: Cardiovascular;;  . CORONARY ARTERY BYPASS GRAFT  1/12  . history of ankle fractures requiring surgery    . LUMBAR LAMINECTOMY/DECOMPRESSION MICRODISCECTOMY Left 04/15/2015   Procedure: LUMBAR LAMINECTOMY/DECOMPRESSION MICRODISCECTOMY 1 LEVEL;  Surgeon: Newman Pies, MD;  Location: [facility] [place] NEURO ORS;  Service: Neurosurgery;  Laterality: Left;  Left L23 microdiskectomy  . MV repair and resection of mass  1/12  . RESECTION DISTAL CLAVICAL Left 05/16/2019   Procedure: RESECTION DISTAL CLAVICAL;  Surgeon: Mcarthur Rossetti, MD;  Location: [facility];  Service: Orthopedics;  Laterality: Left;  . SHOULDER ARTHROSCOPY WITH ROTATOR CUFF REPAIR AND SUBACROMIAL DECOMPRESSION Left 05/16/2019   Procedure: SHOULDER ARTHROSCOPY WITH ROTATOR CUFF REPAIR AND SUBACROMIAL DECOMPRESSION;  Surgeon: Mcarthur Rossetti, MD;  Location: [facility];  Service: Orthopedics;  Laterality: Left;  . TONSILLECTOMY  1971  . TOTAL ABDOMINAL HYSTERECTOMY  1978  . VESICOVAGINAL FISTULA CLOSURE W/ TAH     Social History   Occupational History    Employer: UNEMPLOYED  Tobacco Use  . Smoking status: Former Smoker    Quit date: 10/14/2010    Years since quitting: 9.3  . Smokeless tobacco: Never Used  Vaping Use  . Vaping Use: Never used    Substance and Sexual Activity  . Alcohol use: Yes    Comment: social  . Drug use: No  . Sexual activity: Not Currently

## 2020-02-20 ENCOUNTER — Telehealth (HOSPITAL_COMMUNITY): Payer: Self-pay | Admitting: *Deleted

## 2020-02-20 NOTE — Telephone Encounter (Signed)
Patient given detailed instructions per Myocardial Perfusion Study Information Sheet for the test on 03/03/20 at 10:45. Patient notified to arrive 15 minutes early and that it is imperative to arrive on time for appointment to keep from having the test rescheduled.  If you need to cancel or reschedule your appointment, please call the office within 24 hours of your appointment. . Patient verbalized understanding.Veronia Beets

## 2020-02-24 ENCOUNTER — Other Ambulatory Visit (HOSPITAL_COMMUNITY): Payer: Medicare PPO

## 2020-02-24 ENCOUNTER — Encounter (HOSPITAL_COMMUNITY): Payer: Medicare PPO

## 2020-02-25 ENCOUNTER — Ambulatory Visit: Payer: Medicare PPO | Admitting: Orthopaedic Surgery

## 2020-02-28 ENCOUNTER — Ambulatory Visit
Admission: RE | Admit: 2020-02-28 | Discharge: 2020-02-28 | Disposition: A | Discharge status: Home / Self Care | Payer: Medicare PPO | Source: Ambulatory Visit | Attending: Surgery | Admitting: Surgery

## 2020-02-28 ENCOUNTER — Other Ambulatory Visit: Payer: Self-pay

## 2020-02-28 DIAGNOSIS — M5416 Radiculopathy, lumbar region: Secondary | ICD-10-CM

## 2020-02-28 DIAGNOSIS — M4727 Other spondylosis with radiculopathy, lumbosacral region: Secondary | ICD-10-CM | POA: Diagnosis not present

## 2020-02-28 DIAGNOSIS — M4726 Other spondylosis with radiculopathy, lumbar region: Secondary | ICD-10-CM | POA: Diagnosis not present

## 2020-02-28 DIAGNOSIS — M48061 Spinal stenosis, lumbar region without neurogenic claudication: Secondary | ICD-10-CM | POA: Diagnosis not present

## 2020-02-28 DIAGNOSIS — M2548 Effusion, other site: Secondary | ICD-10-CM | POA: Diagnosis not present

## 2020-02-28 MED ORDER — GADOBENATE DIMEGLUMINE 529 MG/ML IV SOLN
20.0000 mL | Freq: Once | INTRAVENOUS | Status: AC | PRN
  Administered 2020-02-28: 20 mL via INTRAVENOUS

## 2020-03-03 ENCOUNTER — Other Ambulatory Visit: Payer: Self-pay

## 2020-03-03 ENCOUNTER — Ambulatory Visit (HOSPITAL_COMMUNITY): Payer: Medicare PPO | Attending: Emergency Medicine

## 2020-03-03 ENCOUNTER — Ambulatory Visit (HOSPITAL_BASED_OUTPATIENT_CLINIC_OR_DEPARTMENT_OTHER): Payer: Medicare PPO

## 2020-03-03 DIAGNOSIS — I350 Nonrheumatic aortic (valve) stenosis: Secondary | ICD-10-CM | POA: Diagnosis not present

## 2020-03-03 DIAGNOSIS — I2510 Atherosclerotic heart disease of native coronary artery without angina pectoris: Secondary | ICD-10-CM

## 2020-03-03 LAB — MYOCARDIAL PERFUSION IMAGING
LV dias vol: 86 mL (ref 46–106)
LV sys vol: 38 mL
Peak HR: 91 {beats}/min
Rest HR: 64 {beats}/min
SDS: 1
SRS: 0
SSS: 1
TID: 0.91

## 2020-03-03 LAB — ECHOCARDIOGRAM COMPLETE
AR max vel: 3.5 cm2
AV Area VTI: 4.09 cm2
AV Area mean vel: 3.71 cm2
AV Mean grad: 5 mmHg
AV Peak grad: 9.2 mmHg
Ao pk vel: 1.52 m/s
Area-P 1/2: 2.5 cm2
S' Lateral: 2.4 cm

## 2020-03-03 MED ORDER — TECHNETIUM TC 99M TETROFOSMIN IV KIT
31.5000 | PACK | Freq: Once | INTRAVENOUS | Status: AC | PRN
  Administered 2020-03-03: 31.5 via INTRAVENOUS
  Filled 2020-03-03: qty 32

## 2020-03-03 MED ORDER — TECHNETIUM TC 99M TETROFOSMIN IV KIT
10.3000 | PACK | Freq: Once | INTRAVENOUS | Status: AC | PRN
  Administered 2020-03-03: 10.3 via INTRAVENOUS
  Filled 2020-03-03: qty 11

## 2020-03-03 MED ORDER — REGADENOSON 0.4 MG/5ML IV SOLN
0.4000 mg | Freq: Once | INTRAVENOUS | Status: AC
  Administered 2020-03-03: 0.4 mg via INTRAVENOUS

## 2020-03-10 ENCOUNTER — Ambulatory Visit (INDEPENDENT_AMBULATORY_CARE_PROVIDER_SITE_OTHER): Payer: Medicare PPO | Admitting: Orthopaedic Surgery

## 2020-03-10 ENCOUNTER — Encounter: Payer: Self-pay | Admitting: Orthopaedic Surgery

## 2020-03-10 VITALS — Ht 67.0 in | Wt 240.0 lb

## 2020-03-10 DIAGNOSIS — M48062 Spinal stenosis, lumbar region with neurogenic claudication: Secondary | ICD-10-CM

## 2020-03-10 NOTE — Progress Notes (Signed)
Office Visit Note   Patient: [name]           Date of Birth: 21-Dec-1950           MRN: [id number] Visit Date: 03/10/2020              Requested by: [name], MD [address] [place],  [place] [id number] PCP: [name], MD   Assessment & Plan: Visit Diagnoses:  1. Spinal stenosis of lumbar region with neurogenic claudication     Plan: Plan L2-3 L3-4 decompression for spinal stenosis.  She primarily is having claudication symptoms rather than radicular symptoms.  We discussed potential for instability developing, recurrence of stenosis.  Risk of dural tear, stroke, heart attack, anesthetic risks all discussed questions were elicited and answered she requests we proceed.  Follow-Up Instructions: No follow-ups on file.   Orders:  No orders of the defined types were placed in this encounter.  No orders of the defined types were placed in this encounter.     Procedures: No procedures performed   Clinical Data: No additional findings.   Subjective: Chief Complaint  Patient presents with  . Lower Back - Pain, Follow-up    MRI Lumbar Review    HPI 69-year-old female returns with persistent problems with back pain and claudication symptoms with standing or walking.  She has been seen by cardiology for clearance and MRI scan is reviewed with her today again which shows severe stenosis L2-3 L3-4.  Patient had previous surgery 2016 by Dr. Arnoldo Morale with extraforaminal disc surgery on the left at L2-3 burring part of the lateral pars.  Patient had problems with standing after few minutes problems going to the grocery store unless she leans on the cart.  She gets relief with sitting position.  Patient can only walk slightly more than half a block.  She does have type 2 diabetes not on insulin also history of CVA.  MRI scan lumbar 02/29/2020 shows progression of disc degeneration L2-3 with moderate to severe stenosis and bilateral foraminal stenosis.   Unchanged moderate to severe central stenosis at L3-4.  Patient's had bilateral leg symptoms with standing.  She has been Dealer with a cane since she states at times in her leg started getting tired she has to sit down or else she will fall.  Review of Systems previous mitral valve repair.  Positive breast cancer diabetes, hypertension coronary disease.  No current chest pain no shortness of breath.   Objective: Vital Signs: Ht 5\' 7"  (1.702 m)   Wt 240 lb (108.9 kg)   BMI 37.59 kg/m   Physical Exam Constitutional:      Appearance: She is well-developed.  HENT:     Head: Normocephalic.     Right Ear: External ear normal.     Left Ear: External ear normal.  Eyes:     Pupils: Pupils are equal, round, and reactive to light.  Neck:     Thyroid: No thyromegaly.     Trachea: No tracheal deviation.  Cardiovascular:     Rate and Rhythm: Normal rate.  Pulmonary:     Effort: Pulmonary effort is normal.  Abdominal:     Palpations: Abdomen is soft.  Skin:    General: Skin is warm and dry.  Neurological:     Mental Status: She is alert and oriented to person, place, and time.  Psychiatric:        Behavior: Behavior normal.     Ortho Exam negative  logroll the hips well-healed incision and midline L2-3.  Knee and ankle jerk are intact.  Anterior tib gastrocsoleus is intact.  She has palpable pulses.  Some pain with straight leg raising 90 degrees.  Mild valgus right knee with extension with flexion extension.  Slight decreased quad strength symmetrical.  Specialty Comments:  No specialty comments available.  Imaging: CLINICAL DATA:  Worsening low back pain and bilateral lower extremity radiculopathy. Prior lumbar surgeries.  EXAM: MRI LUMBAR SPINE WITHOUT AND WITH CONTRAST  TECHNIQUE: Multiplanar and multiecho pulse sequences of the lumbar spine were obtained without and with intravenous contrast.  CONTRAST:  57mL MULTIHANCE GADOBENATE DIMEGLUMINE 529 MG/ML IV  SOLN  COMPARISON:  04/21/2019  FINDINGS: Segmentation:  Standard.  Alignment:  Mild lumbar levoscoliosis.  No significant listhesis.  Vertebrae: No fracture, suspicious marrow lesion, or evidence of discitis. Prominent degenerative endplate changes from I9-S8 including increased degenerative edema at L2-3.  Conus medullaris and cauda equina: Conus extends to the L1 level. Conus and cauda equina appear normal.  Paraspinal and other soft tissues: Unremarkable.  Disc levels:  Disc desiccation and severe disc space narrowing from L2-3 to L4-5.  T12-L1 and L1-2: Negative.  L2-3: Circumferential disc bulging, a new small right paracentral disc extrusion with superior migration to the mid L2 vertebral body level, and mild ligamentum flavum and severe facet hypertrophy result in mild progression of moderate to severe spinal stenosis, right greater than left lateral recess stenosis, and moderate to severe bilateral neural foraminal stenosis. Potential bilateral L2 and L3 nerve root impingement. Small facet joint effusions.  L3-4: Circumferential disc bulging and moderate facet and ligamentum flavum hypertrophy result in moderate to severe spinal stenosis and moderate to severe bilateral neural foraminal stenosis, unchanged. Potential bilateral L3 and L4 nerve root impingement.  L4-5: Disc bulging and moderate facet and ligamentum flavum hypertrophy result in borderline spinal stenosis, mild bilateral lateral recess stenosis, and moderate bilateral neural foraminal stenosis, unchanged.  L5-S1: Mild left greater than right facet hypertrophy without disc herniation or stenosis, unchanged.  IMPRESSION: 1. Mildly progressive disc degeneration at L2-3 with a new small right paracentral disc extrusion. Moderate to severe spinal and bilateral neural foraminal stenosis. 2. Unchanged moderate to severe spinal and neural foraminal stenosis at L3-4. 3. Unchanged moderate  bilateral foraminal stenosis at L4-5.   Electronically Signed   By: Logan Bores M.D.   On: 02/29/2020 21:33    PMFS History: Patient Active Problem List   Diagnosis Date Noted  . Spinal stenosis of lumbar region 05/21/2019  . Complete tear of left rotator cuff 05/16/2019  . Uncontrolled type 2 diabetes mellitus with hyperglycemia ([place]) 12/19/2018  . Microalbuminuria 04/08/2018  . Genetic testing 09/25/2017  . Family history of breast cancer   . Family history of prostate cancer   . Malignant neoplasm of upper-outer quadrant of right breast in female, estrogen receptor positive ([place]) 06/26/2017  . Lumbar herniated disc 04/15/2015  . Abnormal nuclear stress test 04/03/2015  . 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 CAD (coronary artery disease), native coronary artery 03/30/2015  . S/P mitral valve repair 11/16/2010  . Dyslipidemia 06/29/2010  . Diabetes ([place]) 09/29/2006  . ANEMIA-IRON DEFICIENCY 09/29/2006  . Essential hypertension 09/29/2006   Past Medical History:  Diagnosis Date  . Abscess in epidural space of L2-L5 lumbar spine 03/2006  . Anemia, iron deficiency   . Arthritis   . Breast cancer ([place]) 06/12/2017   right breast  . CAD (coronary artery disease), native coronary artery 03/30/2015  .  Cataract   . Colon polyp    a. Multiple colonic polyps status post colonoscopy in October 2007, consistent with tubular adenoma, tubulovillous adenoma with no high-grade dysplasia or malignancy identified.   . Constipation   . Coronary artery disease    a. s/p CABG 06/2010: S-D2; S-PDA (at time of MV surgery).  . Depression   . Diabetes mellitus    type II  . Family history of breast cancer   . Family history of prostate cancer   . Genetic testing 09/25/2017   Multi-Cancer panel (83 genes) @ [facility] - No pathogenic mutations detected  . GERD (gastroesophageal reflux disease)   . Hx of transient ischemic attack (TIA)    a. See stroke section.  . Hyperlipidemia    . Hypertension   . Hypoxia    a. Has history of acute hypoxic respiratory failure in the setting of bronchitis/PNA or prior admissions.  [name] MRSA infection    a. History of recurrent skin infection and soft tissue abscesses, with MRSA positive in the past.  . 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 mitral valve - s/p resection and MV repair 06/2010 Dr. [name].  . 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 Personal history of radiation therapy   . Sleep apnea    does not use CPAP  . Stenosis of middle cerebral artery    a. Distal R MCA.  . Stroke [facility])    a. 11/2009: mitral mass diagnosed at this time, also has distal R MCA stenosis, tx with coumadin. b. Readmitted 05/2010 with TIA symptoms - had not been taking Coumadin. s/p MV surgery 06/2010. Coumadin stopped 2013 after review of chart by Dr. [name] since mass was removed (stroke felt possibly related to this).     Family History  Problem Relation Age of Onset  . Colon cancer Mother        dx 75s; deceased 70  . Irritable bowel syndrome Mother   . Prostate cancer Father        deceased 38s  . Cirrhosis Sister        died of GI bleed associated with cirrhosis of the liver  . Stroke Brother   . Prostate cancer Brother 75       deceased 27  . Diabetes Brother        x3  . Kidney disease Brother   . Cancer Brother 23       unk. type  . Breast cancer Cousin        several maternal 1st and 2nd cousins with breast cancer    Past Surgical History:  Procedure Laterality Date  . BREAST LUMPECTOMY Right   . BREAST LUMPECTOMY WITH RADIOACTIVE SEED AND SENTINEL LYMPH NODE BIOPSY Right 07/20/2017   Procedure: RIGHT BREAST LUMPECTOMY WITH RADIOACTIVE SEED AND SENTINEL LYMPH NODE BIOPSY;  Surgeon: Alphonsa Overall, MD;  Location: [place];  Service: General;  Laterality: Right;  . CARDIAC CATHETERIZATION  04/03/2015   Procedure: Left Heart Cath and Cors/Grafts Angiography;  Surgeon: Peter M Martinique, MD;  Location: [place] CV LAB;   Service: Cardiovascular;;  . CORONARY ARTERY BYPASS GRAFT  1/12  . history of ankle fractures requiring surgery    . LUMBAR LAMINECTOMY/DECOMPRESSION MICRODISCECTOMY Left 04/15/2015   Procedure: LUMBAR LAMINECTOMY/DECOMPRESSION MICRODISCECTOMY 1 LEVEL;  Surgeon: Newman Pies, MD;  Location: [place] NEURO ORS;  Service: Neurosurgery;  Laterality: Left;  Left L23 microdiskectomy  . MV repair and resection of mass  1/12  . RESECTION DISTAL CLAVICAL Left  05/16/2019   Procedure: RESECTION DISTAL CLAVICAL;  Surgeon: Mcarthur Rossetti, MD;  Location: [place] [facility];  Service: Orthopedics;  Laterality: Left;  . SHOULDER ARTHROSCOPY WITH ROTATOR CUFF REPAIR AND SUBACROMIAL DECOMPRESSION Left 05/16/2019   Procedure: SHOULDER ARTHROSCOPY WITH ROTATOR CUFF REPAIR AND SUBACROMIAL DECOMPRESSION;  Surgeon: Mcarthur Rossetti, MD;  Location: [place];  Service: Orthopedics;  Laterality: Left;  . TONSILLECTOMY  1971  . TOTAL ABDOMINAL HYSTERECTOMY  1978  . VESICOVAGINAL FISTULA CLOSURE W/ TAH     Social History   Occupational History    Employer: UNEMPLOYED  Tobacco Use  . Smoking status: Former Smoker    Quit date: 10/14/2010    Years since quitting: 9.4  . Smokeless tobacco: Never Used  Vaping Use  . Vaping Use: Never used  Substance and Sexual Activity  . Alcohol use: Yes    Comment: social  . Drug use: No  . Sexual activity: Not Currently

## 2020-03-16 ENCOUNTER — Encounter: Payer: Self-pay | Admitting: Internal Medicine

## 2020-03-16 ENCOUNTER — Ambulatory Visit
Admission: RE | Admit: 2020-03-16 | Discharge: 2020-03-16 | Disposition: A | Discharge status: Home / Self Care | Payer: Medicare PPO | Source: Ambulatory Visit | Attending: Internal Medicine | Admitting: Internal Medicine

## 2020-03-16 ENCOUNTER — Other Ambulatory Visit: Payer: Self-pay

## 2020-03-16 ENCOUNTER — Ambulatory Visit: Payer: Medicare PPO | Attending: Internal Medicine | Admitting: Internal Medicine

## 2020-03-16 ENCOUNTER — Other Ambulatory Visit: Payer: Self-pay | Admitting: Internal Medicine

## 2020-03-16 VITALS — BP 172/74 | HR 68 | Temp 98.2°F | Resp 16 | Wt 249.2 lb

## 2020-03-16 DIAGNOSIS — I10 Essential (primary) hypertension: Secondary | ICD-10-CM

## 2020-03-16 DIAGNOSIS — N6310 Unspecified lump in the right breast, unspecified quadrant: Secondary | ICD-10-CM

## 2020-03-16 DIAGNOSIS — E1169 Type 2 diabetes mellitus with other specified complication: Secondary | ICD-10-CM | POA: Diagnosis not present

## 2020-03-16 DIAGNOSIS — I25810 Atherosclerosis of coronary artery bypass graft(s) without angina pectoris: Secondary | ICD-10-CM

## 2020-03-16 DIAGNOSIS — Z23 Encounter for immunization: Secondary | ICD-10-CM

## 2020-03-16 DIAGNOSIS — R928 Other abnormal and inconclusive findings on diagnostic imaging of breast: Secondary | ICD-10-CM | POA: Diagnosis not present

## 2020-03-16 DIAGNOSIS — M48062 Spinal stenosis, lumbar region with neurogenic claudication: Secondary | ICD-10-CM | POA: Diagnosis not present

## 2020-03-16 DIAGNOSIS — E782 Mixed hyperlipidemia: Secondary | ICD-10-CM

## 2020-03-16 DIAGNOSIS — E669 Obesity, unspecified: Secondary | ICD-10-CM | POA: Diagnosis not present

## 2020-03-16 MED ORDER — LOSARTAN POTASSIUM 100 MG PO TABS: 100.0000 mg | ORAL_TABLET | Freq: Every day | ORAL | 3 refills | Status: DC

## 2020-03-16 MED ORDER — AMLODIPINE BESYLATE 10 MG PO TABS: 10.0000 mg | ORAL_TABLET | Freq: Every day | ORAL | 3 refills | Status: DC

## 2020-03-16 MED ORDER — HYDROCHLOROTHIAZIDE 25 MG PO TABS: 25.0000 mg | ORAL_TABLET | Freq: Every day | ORAL | 2 refills | Status: DC

## 2020-03-16 MED ORDER — LINAGLIPTIN 5 MG PO TABS: 5.0000 mg | ORAL_TABLET | Freq: Every day | ORAL | 1 refills | Status: DC

## 2020-03-16 MED ORDER — TRAMADOL HCL 50 MG PO TABS: 50.0000 mg | ORAL_TABLET | Freq: Three times a day (TID) | ORAL | 0 refills | Status: DC | PRN

## 2020-03-16 NOTE — Patient Instructions (Addendum)
Your blood pressure is not at goal.  The goal is 130/80 or lower.  Continue to take your blood pressure medications as prescribed.  Use the tramadol as needed for pain in your back.  It can cause some drowsiness so do not take it when you have to drive or operate any machinery.  I have sent refills on the Tradjenta to [facility].  Influenza Virus Vaccine injection (Fluarix) What is this medicine? INFLUENZA VIRUS VACCINE (in floo EN zuh VAHY ruhs vak SEEN) helps to reduce the risk of getting influenza also known as the flu. This medicine may be used for other purposes; ask your health care provider or pharmacist if you have questions. COMMON BRAND NAME(S): Fluarix, Fluzone What should I tell my health care provider before I take this medicine? They need to know if you have any of these conditions:  bleeding disorder like hemophilia  fever or infection  Guillain-Barre syndrome or other neurological problems  immune system problems  infection with the human immunodeficiency virus (HIV) or AIDS  low blood platelet counts  multiple sclerosis  an unusual or allergic reaction to influenza virus vaccine, eggs, chicken proteins, latex, gentamicin, other medicines, foods, dyes or preservatives  pregnant or trying to get pregnant  breast-feeding How should I use this medicine? This vaccine is for injection into a muscle. It is given by a health care professional. A copy of Vaccine Information Statements will be given before each vaccination. Read this sheet carefully each time. The sheet may change frequently. Talk to your pediatrician regarding the use of this medicine in children. Special care may be needed. Overdosage: If you think you have taken too much of this medicine contact a poison control center or emergency room at once. NOTE: This medicine is only for you. Do not share this medicine with others. What if I miss a dose? This does not apply. What may interact with this  medicine?  chemotherapy or radiation therapy  medicines that lower your immune system like etanercept, anakinra, infliximab, and adalimumab  medicines that treat or prevent blood clots like warfarin  phenytoin  steroid medicines like prednisone or cortisone  theophylline  vaccines This list may not describe all possible interactions. Give your health care provider a list of all the medicines, herbs, non-prescription drugs, or dietary supplements you use. Also tell them if you smoke, drink alcohol, or use illegal drugs. Some items may interact with your medicine. What should I watch for while using this medicine? Report any side effects that do not go away within 3 days to your doctor or health care professional. Call your health care provider if any unusual symptoms occur within 6 weeks of receiving this vaccine. You may still catch the flu, but the illness is not usually as bad. You cannot get the flu from the vaccine. The vaccine will not protect against colds or other illnesses that may cause fever. The vaccine is needed every year. What side effects may I notice from receiving this medicine? Side effects that you should report to your doctor or health care professional as soon as possible:  allergic reactions like skin rash, itching or hives, swelling of the face, lips, or tongue Side effects that usually do not require medical attention (report to your doctor or health care professional if they continue or are bothersome):  fever  headache  muscle aches and pains  pain, tenderness, redness, or swelling at site where injected  weak or tired This list may not describe all  possible side effects. Call your doctor for medical advice about side effects. You may report side effects to FDA at 1-800-FDA-1088. Where should I keep my medicine? This vaccine is only given in a clinic, pharmacy, doctor's office, or other health care setting and will not be stored at home. NOTE: This sheet  is a summary. It may not cover all possible information. If you have questions about this medicine, talk to your doctor, pharmacist, or health care provider.  2020 Elsevier/Gold Standard (2008-01-09 09:30:40)

## 2020-03-16 NOTE — Progress Notes (Signed)
Patient ID: [name], female    DOB: 06-02-1951  MRN: [id number]  CC: Diabetes and Hypertension   Subjective: [name] is a 69 y.o. female who presents for chronic ds management Her concerns today include:  Pt with hx of HTN, DM, HL, depression, chronic back pain, RT breast CA (intraductal CA, ER/PR+, lumpectomy and XRT- completed 08/2017), CKD stage 3, CVA 2011 (residual weakness on LT side), CAD s/p CABG andMVR/resection of fibroelastoma, IDA.  Chronic LBP/spinal stenosis with neurogenic claudication:  Followed by Dr. [name] and plan is for decompression lumbar surgery. -waiting for her insurance approve the surgery. Pain in lower back gets intense.  Reports wakes up crying in the morning because she has difficult getting up to go to bathroom.  Has Gabapentin but feels it does not do much  Pain shoots down LT leg.  Leg gave out on her this a.m.  Ambulates with a cane -undergoing pre-op eval with her cardiologist given her history of CAD status post CABG and aortic stenosis.  HYPERTENSION Currently taking: see medication list.  Carvedilol recently increased to 12.5 mg twice a day by her cardiologist.  Reports compliance with other medications including Norvasc, Cozaar, Med Adherence: [x]  Yes    []  No Medication side effects: []  Yes    [x]  No Adherence with salt restriction: [x]  Yes    []  No Home Monitoring?: [x]  Yes    []  No Monitoring Frequency: Checked by [facility] last wk and BP was 138/70 Home BP results range: []  Yes    []  No SOB? []  Yes    [x]  No Chest Pain?: [x]  Yes    []  No Leg swelling?: []  Yes    [x]  No Headaches?: []  Yes    [x]  No Dizziness? []  Yes    [x]  No Comments:   DIABETES TYPE 2 Last A1C:   Lab Results  Component Value Date   HGBA1C 7.5 (H) 01/20/2020   Med Adherence:  [x]  Yes but out of Trajenta for 2 mths. Taking Glucotrol and Metformin Medication side effects:  []  Yes    [x]  No Home Monitoring?  [x]  Yes  -in mornings before  BF Home glucose results range:104-129 Diet Adherence: [x]  Yes.  Spoke with nutritionist today via phone.  Exercise: []  Yes    []  No Hypoglycemic episodes?: []  Yes    [x]  No Numbness of the feet?  Sometimes. Retinopathy hx? []  Yes    []  No Last eye exam: Due for her eye exam.  She sees Dr. Gershon Crane. Comments:   Anemia:  Pharmacy started charging her for iron supplement so she has not taken it in about 3-4 mths  Patient Active Problem List   Diagnosis Date Noted  . Spinal stenosis of lumbar region 05/21/2019  . Complete tear of left rotator cuff 05/16/2019  . Uncontrolled type 2 diabetes mellitus with hyperglycemia ([place]) 12/19/2018  . Microalbuminuria 04/08/2018  . Genetic testing 09/25/2017  . Family history of breast cancer   . Family history of prostate cancer   . Malignant neoplasm of upper-outer quadrant of right breast in female, estrogen receptor positive ([place]) 06/26/2017  . Lumbar herniated disc 04/15/2015  . Abnormal nuclear stress test 04/03/2015  . 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 CAD (coronary artery disease), native coronary artery 03/30/2015  . S/P mitral valve repair 11/16/2010  . Dyslipidemia 06/29/2010  . Diabetes ([place]) 09/29/2006  . ANEMIA-IRON DEFICIENCY 09/29/2006  . Essential hypertension 09/29/2006     Current Outpatient Medications on File Prior  to Visit  Medication Sig Dispense Refill  . ACCU-CHEK SOFTCLIX LANCETS lancets Use as instructed 100 each 12  . albuterol (PROVENTIL HFA;VENTOLIN HFA) 108 (90 Base) MCG/ACT inhaler Inhale 2 puffs into the lungs every 6 (six) hours as needed for wheezing or shortness of breath. 1 Inhaler 2  . aspirin EC 81 MG tablet Take 1 tablet (81 mg total) by mouth daily. 30 tablet 5  . atorvastatin (LIPITOR) 80 MG tablet Take 1 tablet (80 mg total) by mouth daily. 90 tablet 1  . baclofen (LIORESAL) 10 MG tablet Take 0.5-1 tablets (5-10 mg total) by mouth 3 (three) times daily as needed for muscle spasms. 60 each 3   . Blood Glucose Monitoring Suppl (ACCU-CHEK AVIVA PLUS) w/Device KIT Use as directed 1 kit 0  . buPROPion (WELLBUTRIN XL) 150 MG 24 hr tablet Take 1 tablet (150 mg total) by mouth 2 (two) times daily. 180 tablet 2  . carvedilol (COREG) 12.5 MG tablet Take 1 tablet (12.5 mg total) by mouth 2 (two) times daily. 180 tablet 3  . colchicine 0.6 MG tablet Take 0.6 mg by mouth daily.    . diclofenac Sodium (VOLTAREN) 1 % GEL APPLY 2 GRAMS TOPICALLY 4 (FOUR) TIMES DAILY AS NEEDED. 100 g 0  . ferrous sulfate (FEROSUL) 325 (65 FE) MG tablet TAKE 1 TABLET (325 MG TOTAL) BY MOUTH DAILY WITH BREAKFAST. 90 tablet 1  . gabapentin (NEURONTIN) 100 MG capsule Take one at night for a few days then increase to one twice a day. 60 capsule 0  . glipiZIDE (GLUCOTROL) 5 MG tablet TAKE 1 TABLET DAILY BEFORE BREAKFAST 90 tablet 0  . glucose blood (ACCU-CHEK AVIVA PLUS) test strip Use as instructed 100 each 12  . letrozole (FEMARA) 2.5 MG tablet Take 1 tablet (2.5 mg total) by mouth daily. 90 tablet 3  . metFORMIN (GLUCOPHAGE) 1000 MG tablet TAKE 1 TABLET TWICE DAILY WITH MEALS 180 tablet 0  . pantoprazole (PROTONIX) 40 MG tablet TAKE 1 TABLET EVERY DAY 90 tablet 3  . polyethylene glycol (MIRALAX / GLYCOLAX) 17 g packet Take 17 g by mouth daily as needed for mild constipation or moderate constipation. 30 each 5  . traZODone (DESYREL) 50 MG tablet TAKE 1 TABLET AT BEDTIME AS NEEDED FOR SLEEP 90 tablet 0   No current facility-administered medications on file prior to visit.    No Known Allergies  Social History   Socioeconomic History  . Marital status: Married    Spouse name: Not on file  . Number of children: 3  . Years of education: Not on file  . Highest education level: Not on file  Occupational History    Employer: UNEMPLOYED  Tobacco Use  . Smoking status: Former Smoker    Quit date: 10/14/2010    Years since quitting: 9.4  . Smokeless tobacco: Never Used  Vaping Use  . Vaping Use: Never used   Substance and Sexual Activity  . Alcohol use: Yes    Comment: social  . Drug use: No  . Sexual activity: Not Currently  Other Topics Concern  . Not on file  Social History Narrative   Lives with husband, stay at home, uses cane occasionally, still active/ambulatory.    Social Determinants of Health   Financial Resource Strain:   . Difficulty of Paying Living Expenses: Not on file  Food Insecurity:   . Worried About Charity fundraiser in the Last Year: Not on file  . Ran Out of Food in the  Last Year: Not on file  Transportation Needs:   . Lack of Transportation (Medical): Not on file  . Lack of Transportation (Non-Medical): Not on file  Physical Activity:   . Days of Exercise per Week: Not on file  . Minutes of Exercise per Session: Not on file  Stress:   . Feeling of Stress : Not on file  Social Connections:   . Frequency of Communication with Friends and Family: Not on file  . Frequency of Social Gatherings with Friends and Family: Not on file  . Attends Religious Services: Not on file  . Active Member of Clubs or Organizations: Not on file  . Attends Archivist Meetings: Not on file  . Marital Status: Not on file  Intimate Partner Violence:   . Fear of Current or Ex-Partner: Not on file  . Emotionally Abused: Not on file  . Physically Abused: Not on file  . Sexually Abused: Not on file    Family History  Problem Relation Age of Onset  . Colon cancer Mother        dx 63s; deceased 74  . Irritable bowel syndrome Mother   . Prostate cancer Father        deceased 74s  . Cirrhosis Sister        died of GI bleed associated with cirrhosis of the liver  . Stroke Brother   . Prostate cancer Brother 79       deceased 94  . Diabetes Brother        x3  . Kidney disease Brother   . Cancer Brother 2       unk. type  . Breast cancer Cousin        several maternal 1st and 2nd cousins with breast cancer    Past Surgical History:  Procedure Laterality Date   . BREAST BIOPSY Right 08/26/2019   benign  . BREAST LUMPECTOMY Right   . BREAST LUMPECTOMY WITH RADIOACTIVE SEED AND SENTINEL LYMPH NODE BIOPSY Right 07/20/2017   Procedure: RIGHT BREAST LUMPECTOMY WITH RADIOACTIVE SEED AND SENTINEL LYMPH NODE BIOPSY;  Surgeon: Alphonsa Overall, MD;  Location: [facility];  Service: General;  Laterality: Right;  . CARDIAC CATHETERIZATION  04/03/2015   Procedure: Left Heart Cath and Cors/Grafts Angiography;  Surgeon: Peter M Martinique, MD;  Location: [facility];  Service: Cardiovascular;;  . CORONARY ARTERY BYPASS GRAFT  1/12  . history of ankle fractures requiring surgery    . LUMBAR LAMINECTOMY/DECOMPRESSION MICRODISCECTOMY Left 04/15/2015   Procedure: LUMBAR LAMINECTOMY/DECOMPRESSION MICRODISCECTOMY 1 LEVEL;  Surgeon: Newman Pies, MD;  Location: [facility];  Service: Neurosurgery;  Laterality: Left;  Left L23 microdiskectomy  . MV repair and resection of mass  1/12  . RESECTION DISTAL CLAVICAL Left 05/16/2019   Procedure: RESECTION DISTAL CLAVICAL;  Surgeon: Mcarthur Rossetti, MD;  Location: [facility];  Service: Orthopedics;  Laterality: Left;  . SHOULDER ARTHROSCOPY WITH ROTATOR CUFF REPAIR AND SUBACROMIAL DECOMPRESSION Left 05/16/2019   Procedure: SHOULDER ARTHROSCOPY WITH ROTATOR CUFF REPAIR AND SUBACROMIAL DECOMPRESSION;  Surgeon: Mcarthur Rossetti, MD;  Location: [facility];  Service: Orthopedics;  Laterality: Left;  . TONSILLECTOMY  1971  . TOTAL ABDOMINAL HYSTERECTOMY  1978  . VESICOVAGINAL FISTULA CLOSURE W/ TAH      ROS: Review of Systems Negative except as stated above  PHYSICAL EXAM: BP (!) 172/74   Pulse 68   Temp 98.2 F (36.8 C)   Resp 16   Wt 249 lb 3.2  oz (113 kg)   SpO2 98%   BMI 39.03 kg/m   Physical Exam Repeat BP 140/80 General appearance - alert, well appearing, obese elderly African-American female and in no distress Mental status - normal mood, behavior, speech, dress, motor  activity, and thought processes Neck - supple, no significant adenopathy Chest - clear to auscultation, no wheezes, rales or rhonchi, symmetric air entry Heart - normal rate, regular rhythm, normal S1, S2, no murmurs, rubs, clicks or gallops Extremities - peripheral pulses normal, no pedal edema, no clubbing or cyanosis   CMP Latest Ref Rng & Units 03/16/2020 01/20/2020 05/13/2019  Glucose 65 - 99 mg/dL 86 - 159(H)  BUN 8 - 27 mg/dL 25 - 16  Creatinine 0.57 - 1.00 mg/dL 1.55(H) - 1.09(H)  Sodium 134 - 144 mmol/L 140 - 140  Potassium 3.5 - 5.2 mmol/L 4.3 - 5.4(H)  Chloride 96 - 106 mmol/L 105 - 103  CO2 20 - 29 mmol/L 21 - 25  Calcium 8.7 - 10.3 mg/dL 9.1 - 9.4  Total Protein 6.0 - 8.5 g/dL - 6.7 -  Total Bilirubin 0.0 - 1.2 mg/dL - 0.3 -  Alkaline Phos 48 - 121 IU/L - 62 -  AST 0 - 40 IU/L - 17 -  ALT 0 - 32 IU/L - 8 -   Lipid Panel     Component Value Date/Time   CHOL 206 (H) 01/20/2020 1434   TRIG 140 01/20/2020 1434   HDL 82 01/20/2020 1434   CHOLHDL 2.5 01/20/2020 1434   CHOLHDL 2.8 04/28/2016 1028   VLDL 19 04/28/2016 1028   LDLCALC 100 (H) 01/20/2020 1434   LDLDIRECT 101.7 07/18/2011 0901    CBC    Component Value Date/Time   WBC 7.7 03/16/2020 1655   WBC 8.9 07/14/2017 0930   RBC 4.21 03/16/2020 1655   RBC 4.25 07/14/2017 0930   HGB 10.9 (L) 03/16/2020 1655   HCT 34.1 03/16/2020 1655   PLT 279 03/16/2020 1655   MCV 81 03/16/2020 1655   MCH 25.9 (L) 03/16/2020 1655   MCH 25.6 (L) 07/14/2017 0930   MCHC 32.0 03/16/2020 1655   MCHC 31.5 07/14/2017 0930   RDW 14.3 03/16/2020 1655   LYMPHSABS 2,541 04/28/2016 1028   MONOABS 385 04/28/2016 1028   EOSABS 77 04/28/2016 1028   BASOSABS 0 04/28/2016 1028    ASSESSMENT AND PLAN: 1. Type 2 diabetes mellitus with obesity ([place]) -Reported blood sugars are good but our goal is to get her A1c less than 7.  She will continue current medications.  Refill given on Tradjenta. - Ambulatory referral to Ophthalmology -  CBC - Basic Metabolic Panel - linagliptin (TRADJENTA) 5 MG TABS tablet; Take 1 tablet (5 mg total) by mouth daily.  Dispense: 90 tablet; Refill: 1  2. Essential hypertension Not at goal but repeat blood pressure today was better.  Continue current medications and low-salt diet. - amLODipine (NORVASC) 10 MG tablet; Take 1 tablet (10 mg total) by mouth daily.  Dispense: 90 tablet; Refill: 3 - losartan (COZAAR) 100 MG tablet; Take 1 tablet (100 mg total) by mouth daily.  Dispense: 90 tablet; Refill: 3 - hydrochlorothiazide (HYDRODIURIL) 25 MG tablet; Take 1 tablet (25 mg total) by mouth daily.  Dispense: 90 tablet; Refill: 2  3. Mixed hyperlipidemia Continue atorvastatin.  4. Coronary artery disease involving coronary bypass graft of native heart without angina pectoris Continue aspirin, statin therapy, carvedilol.  Followed by cardiology  5. Spinal stenosis of lumbar region with neurogenic claudication Followed  by orthopedics.  Plan for decompression surgery in the near future.  I have given some tramadol for her to use as needed in the meantime.  Advised that the medication can cause some drowsiness and constipation. - traMADol (ULTRAM) 50 MG tablet; Take 1 tablet (50 mg total) by mouth every 8 (eight) hours as needed.  Dispense: 60 tablet; Refill: 0  6. Need for immunization against influenza Given today - Flu Vaccine QUAD 36+ mos IM    Patient was given the opportunity to ask questions.  Patient verbalized understanding of the plan and was able to repeat key elements of the plan.   Orders Placed This Encounter  Procedures  . Flu Vaccine QUAD 36+ mos IM  . CBC  . Basic Metabolic Panel  . Ambulatory referral to Ophthalmology     Requested Prescriptions   Signed Prescriptions Disp Refills  . amLODipine (NORVASC) 10 MG tablet 90 tablet 3    Sig: Take 1 tablet (10 mg total) by mouth daily.  . traMADol (ULTRAM) 50 MG tablet 60 tablet 0    Sig: Take 1 tablet (50 mg total) by  mouth every 8 (eight) hours as needed.  [name] losartan (COZAAR) 100 MG tablet 90 tablet 3    Sig: Take 1 tablet (100 mg total) by mouth daily.  . hydrochlorothiazide (HYDRODIURIL) 25 MG tablet 90 tablet 2    Sig: Take 1 tablet (25 mg total) by mouth daily.  [name] linagliptin (TRADJENTA) 5 MG TABS tablet 90 tablet 1    Sig: Take 1 tablet (5 mg total) by mouth daily.    Return in about 3 months (around 06/15/2020).  Karle Plumber, MD, FACP

## 2020-03-17 ENCOUNTER — Telehealth: Payer: Self-pay | Admitting: Internal Medicine

## 2020-03-17 DIAGNOSIS — I10 Essential (primary) hypertension: Secondary | ICD-10-CM

## 2020-03-17 DIAGNOSIS — E669 Obesity, unspecified: Secondary | ICD-10-CM

## 2020-03-17 LAB — CBC
Hematocrit: 34.1 % (ref 34.0–46.6)
Hemoglobin: 10.9 g/dL — ABNORMAL LOW (ref 11.1–15.9)
MCH: 25.9 pg — ABNORMAL LOW (ref 26.6–33.0)
MCHC: 32 g/dL (ref 31.5–35.7)
MCV: 81 fL (ref 79–97)
Platelets: 279 10*3/uL (ref 150–450)
RBC: 4.21 x10E6/uL (ref 3.77–5.28)
RDW: 14.3 % (ref 11.7–15.4)
WBC: 7.7 10*3/uL (ref 3.4–10.8)

## 2020-03-17 LAB — BASIC METABOLIC PANEL
BUN/Creatinine Ratio: 16 (ref 12–28)
BUN: 25 mg/dL (ref 8–27)
CO2: 21 mmol/L (ref 20–29)
Calcium: 9.1 mg/dL (ref 8.7–10.3)
Chloride: 105 mmol/L (ref 96–106)
Creatinine, Ser: 1.55 mg/dL — ABNORMAL HIGH (ref 0.57–1.00)
GFR calc Af Amer: 39 mL/min/{1.73_m2} — ABNORMAL LOW (ref >59)
GFR calc non Af Amer: 34 mL/min/{1.73_m2} — ABNORMAL LOW (ref >59)
Glucose: 86 mg/dL (ref 65–99)
Potassium: 4.3 mmol/L (ref 3.5–5.2)
Sodium: 140 mmol/L (ref 134–144)

## 2020-03-17 MED ORDER — AMLODIPINE BESYLATE 10 MG PO TABS: 10.0000 mg | ORAL_TABLET | Freq: Every day | ORAL | 3 refills | Status: DC

## 2020-03-17 MED ORDER — LOSARTAN POTASSIUM 100 MG PO TABS: 100.0000 mg | ORAL_TABLET | Freq: Every day | ORAL | 3 refills | Status: DC

## 2020-03-17 MED ORDER — LINAGLIPTIN 5 MG PO TABS: 5.0000 mg | ORAL_TABLET | Freq: Every day | ORAL | 1 refills | Status: DC

## 2020-03-17 MED ORDER — HYDROCHLOROTHIAZIDE 25 MG PO TABS: 25.0000 mg | ORAL_TABLET | Freq: Every day | ORAL | 2 refills | Status: DC

## 2020-03-17 NOTE — Telephone Encounter (Signed)
Copied from [place] [id number]. Topic: General - Other >> Mar 17, 2020 12:14 PM Leward Quan A wrote: Reason for CRM: Patient called to ask Dr Wynetta Emery to please send the Rx for amLODipine (NORVASC) 10 MG tablet, hydrochlorothiazide (HYDRODIURIL) 25 MG tablet, linagliptin (TRADJENTA) 5 MG TABS tablet, losartan (COZAAR) 100 MG tablet

## 2020-03-17 NOTE — Telephone Encounter (Signed)
Rx sent 

## 2020-03-22 ENCOUNTER — Encounter (HOSPITAL_COMMUNITY): Payer: Self-pay

## 2020-03-22 ENCOUNTER — Emergency Department (HOSPITAL_COMMUNITY)
Admission: EM | Admit: 2020-03-22 | Discharge: 2020-03-23 | Disposition: A | Discharge status: Home / Self Care | Payer: Medicare PPO | Attending: Emergency Medicine | Admitting: Emergency Medicine

## 2020-03-22 DIAGNOSIS — Z5321 Procedure and treatment not carried out due to patient leaving prior to being seen by health care provider: Secondary | ICD-10-CM | POA: Insufficient documentation

## 2020-03-22 DIAGNOSIS — T43211A Poisoning by selective serotonin and norepinephrine reuptake inhibitors, accidental (unintentional), initial encounter: Secondary | ICD-10-CM | POA: Insufficient documentation

## 2020-03-22 LAB — COMPREHENSIVE METABOLIC PANEL
ALT: 12 U/L (ref 0–44)
AST: 17 U/L (ref 15–41)
Albumin: 3.4 g/dL — ABNORMAL LOW (ref 3.5–5.0)
Alkaline Phosphatase: 52 U/L (ref 38–126)
Anion gap: 10 (ref 5–15)
BUN: 21 mg/dL (ref 8–23)
CO2: 24 mmol/L (ref 22–32)
Calcium: 8.9 mg/dL (ref 8.9–10.3)
Chloride: 103 mmol/L (ref 98–111)
Creatinine, Ser: 1.45 mg/dL — ABNORMAL HIGH (ref 0.44–1.00)
GFR calc Af Amer: 42 mL/min — ABNORMAL LOW (ref >60)
GFR calc non Af Amer: 36 mL/min — ABNORMAL LOW (ref >60)
Glucose, Bld: 142 mg/dL — ABNORMAL HIGH (ref 70–99)
Potassium: 4.5 mmol/L (ref 3.5–5.1)
Sodium: 137 mmol/L (ref 135–145)
Total Bilirubin: 0.4 mg/dL (ref 0.3–1.2)
Total Protein: 6.4 g/dL — ABNORMAL LOW (ref 6.5–8.1)

## 2020-03-22 LAB — CBC
HCT: 34.7 % — ABNORMAL LOW (ref 36.0–46.0)
Hemoglobin: 10.6 g/dL — ABNORMAL LOW (ref 12.0–15.0)
MCH: 25.7 pg — ABNORMAL LOW (ref 26.0–34.0)
MCHC: 30.5 g/dL (ref 30.0–36.0)
MCV: 84.2 fL (ref 80.0–100.0)
Platelets: 243 10*3/uL (ref 150–400)
RBC: 4.12 MIL/uL (ref 3.87–5.11)
RDW: 15.2 % (ref 11.5–15.5)
WBC: 5.9 10*3/uL (ref 4.0–10.5)
nRBC: 0 % (ref 0.0–0.2)

## 2020-03-22 LAB — ETHANOL: Alcohol, Ethyl (B): <10 mg/dL (ref <10)

## 2020-03-22 LAB — SALICYLATE LEVEL: Salicylate Lvl: <7 mg/dL — ABNORMAL LOW (ref 7.0–30.0)

## 2020-03-22 LAB — ACETAMINOPHEN LEVEL: Acetaminophen (Tylenol), Serum: <10 ug/mL — ABNORMAL LOW (ref 10–30)

## 2020-03-22 NOTE — ED Triage Notes (Signed)
Pt states that she mixes all of nighty pills in a pill bottle and then takes them, pt got confused and then took the wrong bottle of pills, pt took appx 10-15 trazadone 50 mg. Pt denies SI and states it was a mistake and happened about 30 minutes ago.

## 2020-03-22 NOTE — ED Notes (Signed)
Lwbs. 

## 2020-03-23 ENCOUNTER — Ambulatory Visit: Payer: Medicare PPO | Attending: Internal Medicine | Admitting: Pharmacist

## 2020-03-23 ENCOUNTER — Other Ambulatory Visit: Payer: Self-pay

## 2020-03-23 DIAGNOSIS — Z23 Encounter for immunization: Secondary | ICD-10-CM | POA: Diagnosis not present

## 2020-03-23 NOTE — Progress Notes (Signed)
Patient presents for vaccination against zoster per orders of Dr. Johnson. Consent given. Counseling provided. No contraindications exists. Vaccine administered without incident.   Luke Van Ausdall, PharmD, CPP Clinical Pharmacist [facility] [phone]  

## 2020-03-31 ENCOUNTER — Telehealth: Payer: Self-pay

## 2020-03-31 NOTE — Telephone Encounter (Signed)
Contacted pt to go over mm/breast US results pt is aware and doesn't have any questions or concerns

## 2020-05-07 ENCOUNTER — Encounter: Payer: Self-pay | Admitting: Internal Medicine

## 2020-05-07 ENCOUNTER — Telehealth: Payer: Self-pay | Admitting: Internal Medicine

## 2020-05-07 DIAGNOSIS — M48062 Spinal stenosis, lumbar region with neurogenic claudication: Secondary | ICD-10-CM

## 2020-05-07 MED ORDER — TRAMADOL HCL 50 MG PO TABS: 50.0000 mg | ORAL_TABLET | Freq: Three times a day (TID) | ORAL | 0 refills | Status: DC | PRN

## 2020-05-07 NOTE — Telephone Encounter (Signed)
Medication Refill - Medication: traMADol (ULTRAM) 50 MG tablet     Preferred Pharmacy (with phone number or street name):  [facility], [place] Phone:  [phone]  Fax:  [phone]       Agent: Please be advised that RX refills may take up to 3 business days. We ask that you follow-up with your pharmacy.

## 2020-05-07 NOTE — Telephone Encounter (Signed)
Please refill if indicated! 

## 2020-05-07 NOTE — Telephone Encounter (Signed)
Patient called again to check on her refill status. Please advise

## 2020-05-08 ENCOUNTER — Telehealth: Payer: Self-pay | Admitting: Internal Medicine

## 2020-05-08 NOTE — Telephone Encounter (Signed)
Rosann Auerbach with [facility] needs a dx code on the opioid rx - why does pt need? traMADol Veatrice Bourbon) 50 MG tablet  [place], [address] [place] Phone:  [phone]  Fax:  [phone]

## 2020-05-08 NOTE — Telephone Encounter (Signed)
Call placed to the pharmacy. Dx code was provided earlier. No further action needed.

## 2020-05-08 NOTE — Telephone Encounter (Signed)
Spinal stenosis of lumbar region with neurogenic claudication (I77.824)

## 2020-05-08 NOTE — Telephone Encounter (Signed)
Lurena Joiner could you contact pharmacy when they re-open

## 2020-05-08 NOTE — Telephone Encounter (Signed)
PT made aware

## 2020-05-11 ENCOUNTER — Telehealth: Payer: Self-pay | Admitting: Internal Medicine

## 2020-05-11 NOTE — Telephone Encounter (Signed)
Please contact pt and schedule a medical clearance with Dr. Wynetta Emery before the 1st of December

## 2020-05-11 NOTE — Telephone Encounter (Signed)
Copied from [place] [id number]. Topic: General - Other >> May 08, 2020  4:38 PM Yvette Rack wrote: Reason for CRM: Pt stated she needs a medical clearance from Dr. Wynetta Emery to be sent to Dr. Rodell Perna at the orthopedic center on [address] (phone # [phone]) so she can have her surgery.

## 2020-05-12 ENCOUNTER — Other Ambulatory Visit: Payer: Self-pay

## 2020-05-15 ENCOUNTER — Ambulatory Visit: Payer: Medicare PPO | Attending: Internal Medicine | Admitting: Internal Medicine

## 2020-05-15 ENCOUNTER — Encounter: Payer: Self-pay | Admitting: Internal Medicine

## 2020-05-15 VITALS — BP 189/97 | HR 73 | Resp 16 | Wt 238.6 lb

## 2020-05-15 DIAGNOSIS — Z01818 Encounter for other preprocedural examination: Secondary | ICD-10-CM

## 2020-05-15 DIAGNOSIS — I25810 Atherosclerosis of coronary artery bypass graft(s) without angina pectoris: Secondary | ICD-10-CM

## 2020-05-15 DIAGNOSIS — N1831 Chronic kidney disease, stage 3a: Secondary | ICD-10-CM | POA: Diagnosis not present

## 2020-05-15 DIAGNOSIS — D509 Iron deficiency anemia, unspecified: Secondary | ICD-10-CM

## 2020-05-15 DIAGNOSIS — I10 Essential (primary) hypertension: Secondary | ICD-10-CM

## 2020-05-15 DIAGNOSIS — E669 Obesity, unspecified: Secondary | ICD-10-CM | POA: Diagnosis not present

## 2020-05-15 DIAGNOSIS — E1169 Type 2 diabetes mellitus with other specified complication: Secondary | ICD-10-CM

## 2020-05-15 LAB — POCT GLYCOSYLATED HEMOGLOBIN (HGB A1C): HbA1c, POC (controlled diabetic range): 7.7 % — AB (ref 0.0–7.0)

## 2020-05-15 MED ORDER — FERROUS SULFATE 325 (65 FE) MG PO TABS: ORAL_TABLET | ORAL | 0 refills | Status: DC
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
## 2020-05-15 NOTE — Patient Instructions (Signed)
Start taking iron supplement one tablet daily. I will get back to you with results of blood test done today. Return in 1 week for blood pressure recheck.  Take your medications before coming to that appointment.

## 2020-05-16 LAB — BASIC METABOLIC PANEL
BUN/Creatinine Ratio: 7 — ABNORMAL LOW (ref 12–28)
BUN: 9 mg/dL (ref 8–27)
CO2: 22 mmol/L (ref 20–29)
Calcium: 10 mg/dL (ref 8.7–10.3)
Chloride: 102 mmol/L (ref 96–106)
Creatinine, Ser: 1.21 mg/dL — ABNORMAL HIGH (ref 0.57–1.00)
GFR calc Af Amer: 52 mL/min/{1.73_m2} — ABNORMAL LOW (ref >59)
GFR calc non Af Amer: 45 mL/min/{1.73_m2} — ABNORMAL LOW (ref >59)
Glucose: 178 mg/dL — ABNORMAL HIGH (ref 65–99)
Potassium: 5 mmol/L (ref 3.5–5.2)
Sodium: 141 mmol/L (ref 134–144)

## 2020-05-16 LAB — IRON,TIBC AND FERRITIN PANEL
Ferritin: 42 ng/mL (ref 15–150)
Iron Saturation: 10 % — ABNORMAL LOW (ref 15–55)
Iron: 39 ug/dL (ref 27–139)
Total Iron Binding Capacity: 379 ug/dL (ref 250–450)
UIBC: 340 ug/dL (ref 118–369)

## 2020-05-17 NOTE — Progress Notes (Addendum)
Pt seen by clinical pharmacist today 05/19/2020.  BP better.  Okay to move forward with surgery.  Let patient know that her kidney function has improved compared to when we checked it back in September.  She can continue Metformin 1000 mg twice a day.  Iron level is low.  Please make sure that she picks up the iron prescription also known as ferrous sulfate which I sent to [facility] on [address]..  She should start taking now to help build up her blood count prior to surgery.

## 2020-05-18 ENCOUNTER — Telehealth: Payer: Self-pay

## 2020-05-18 ENCOUNTER — Telehealth: Payer: Self-pay | Admitting: *Deleted

## 2020-05-18 NOTE — Telephone Encounter (Signed)
   [facility] Pre-operative Risk Assessment    HEARTCARE STAFF: - Please ensure there is not already an duplicate clearance open for this procedure. - Under Visit Info/Reason for Call, type in Other and utilize the format Clearance MM/DD/YY or Clearance TBD. Do not use dashes or single digits. - If request is for dental extraction, please clarify the # of teeth to be extracted.  Request for surgical clearance:  1. What type of surgery is being performed? 2 LEVEL LUMBAR DECOMPRESSION   2. When is this surgery scheduled? 05/27/20   3. What type of clearance is required (medical clearance vs. Pharmacy clearance to hold med vs. Both)? MEDICAL  4. Are there any medications that need to be held prior to surgery and how long? ASA    5. Practice name and name of physician performing surgery? [facility]; DR. [place]   6. What is the office phone number? [phone]   7.   What is the office fax number? [place]   Anesthesia type (None, local, MAC, general) ? GENERAL   [name] 05/18/2020, 1:22 PM  _________________________________________________________________   (provider comments below)

## 2020-05-18 NOTE — Telephone Encounter (Addendum)
Hi Dr. Radford Pax,  [name] is scheduled for a lumbar decompression on 05/27/2020 and is being asked to hold Aspirin. She has a history of CAD and papillary fibroelastoma of the mitral valve s/p CABG x2 as well as resection of fibroelastoma by Dr. [name] in 2012. Last cath in 2016 showed patent grafts. You last saw her for a virtual visit in 01/2020. At that time she was doing well from a cardiac standpoint. However, she had torn her rotator cuff and was going to need surgery. You ordered Lexiscan and Echo for further evaluation. Lexiscan was low risk with reduced counts in inferior segments consistent with diaphragm attenuation but no reversible ischemia. Echo showed LVEF of 60-65% with moderate LVH and grade 2 diastolic dysfunction. Can you please comment on how long patient can hold Aspirin for upcoming spinal procedure?  Please route response back to P CV DIV PREOP.  Thank you! [name]

## 2020-05-18 NOTE — Telephone Encounter (Signed)
Called patient to complete pre-op risk assessment. She was driving and asked that I call her back later. She said tomorrow around 12pm would be a good time. I will call her back tomorrow.  Darreld Mclean, PA-C 05/18/2020 4:28 PM

## 2020-05-18 NOTE — Telephone Encounter (Signed)
I am fine with holding ASA but the surgeon will need to tell patient how long to hold ASA and when to resume

## 2020-05-18 NOTE — Telephone Encounter (Signed)
Contacted pt to go over lab results pt is aware and doesn't have any questions or concerns 

## 2020-05-18 NOTE — Progress Notes (Signed)
[place], [place] [address] Phone: [phone] Fax: [phone]  [place], [place] [address] Phone: [phone] Fax: [phone]      Your procedure is scheduled on Wednesday 05/27/2020.  Report to [facility] Main Entrance "A" at 1:20 P.M., and check in at the Admitting office.  Call this number if you have problems the morning of surgery:  [phone]  Call [phone] if you have any questions prior to your surgery date Monday-Friday 8am-4pm    Remember:  Do not eat after midnight the night before your surgery  You may drink clear liquids until 12:20pm the afternoon of your surgery.   Clear liquids allowed are: Water, Non-Citrus Juices (without pulp) - sugar free if diabetic, Carbonated Beverages - diet if diabetic, Clear Tea, Black Coffee Only (no cream or dairy products), and Gatorade - Gatorade Zero if diabetic   Enhanced Recovery after Surgery for Orthopedics Enhanced Recovery after Surgery is a protocol used to improve the stress on your body and your recovery after surgery.  Patient Instructions  . The night before surgery:  o No food after midnight. ONLY clear liquids after midnight  . The day of surgery (if you have diabetes): o Drink ONE (1) bottled water by 12:20pm the afternoon of surgery o This water was given to you during your hospital  pre-op appointment visit.  o Nothing else to drink after completing the  bottled water         If you have questions, please contact your surgeon's office.   Take these medicines the morning of surgery with A SIP OF WATER: Atorvastatin (Lipitor) Bupropion (Wellbutrin XL) Carvedilol (Coreg) Colchicine Gabapentin (Neurontin) Pantoprazole (Protonix)    If NEEDED you may take the following medications the morning of surgery: Albuterol (Proventil;  Ventolin) Inhaler - Please bring all inhalers with you the day of surgery.  Tramadol (Ultram)   As of today, STOP taking any Diclofenac Sodium (Voltaren), Aspirin (unless otherwise instructed by your surgeon) Aleve, Naproxen, Ibuprofen, Motrin, Advil, Goody's, BC's, all herbal medications, fish oil, and all vitamins.   WHAT DO I DO ABOUT MY DIABETES MEDICATION?  . Do NOT take oral diabetes medicines (pills) the morning of surgery. - do NOT take Glipizide (Glucotrol), Linagliptin (Trajenta) or Metformin (Glucophage) the day of surgery.    HOW TO MANAGE YOUR DIABETES BEFORE AND AFTER SURGERY  Why is it important to control my blood sugar before and after surgery? . Improving blood sugar levels before and after surgery helps healing and can limit problems. . A way of improving blood sugar control is eating a healthy diet by: o  Eating less sugar and carbohydrates o  Increasing activity/exercise o  Talking with your doctor about reaching your blood sugar goals . High blood sugars (greater than 180 mg/dL) can raise your risk of infections and slow your recovery, so you will need to focus on controlling your diabetes during the weeks before surgery. . Make sure that the doctor who takes care of your diabetes knows about your planned surgery including the date and location.  How do I manage my blood sugar before surgery? . Check your blood sugar at least 4 times a day, starting 2 days before surgery, to make sure that the level is not too high or low. . Check your blood sugar the morning of your surgery when you  wake up and every 2 hours until you get to the Short Stay unit. o If your blood sugar is less than 70 mg/dL, you will need to treat for low blood sugar: - Do not take insulin. - Treat a low blood sugar (less than 70 mg/dL) with  cup of clear juice (cranberry or apple), 4 glucose tablets, OR glucose gel. - Recheck blood sugar in 15 minutes after treatment (to make sure it is greater  than 70 mg/dL). If your blood sugar is not greater than 70 mg/dL on recheck, call [phone] for further instructions. . Report your blood sugar to the short stay nurse when you get to Short Stay.  . If you are admitted to the hospital after surgery: o Your blood sugar will be checked by the staff and you will probably be given insulin after surgery (instead of oral diabetes medicines) to make sure you have good blood sugar levels. o The goal for blood sugar control after surgery is 80-180 mg/dL.                      Do not wear jewelry, make up, or nail polish            Do not wear lotions, powders, perfumes/colognes, or deodorant.            Do not shave 48 hours prior to surgery.  Men may shave face and neck.            Do not bring valuables to the hospital.            [facility] is not responsible for any belongings or valuables.  Do NOT Smoke (Tobacco/Vaping) or drink Alcohol 24 hours prior to your procedure  If you use a CPAP at night, you may bring all equipment for your overnight stay.   Contacts, glasses, dentures or bridgework may not be worn into surgery.      For patients admitted to the hospital, discharge time will be determined by your treatment team.   Patients discharged the day of surgery will not be allowed to drive home, and someone needs to stay with them for 24 hours.    Special instructions:   [facility]- Preparing For Surgery  Before surgery, you can play an important role. Because skin is not sterile, your skin needs to be as free of germs as possible. You can reduce the number of germs on your skin by washing with CHG (chlorahexidine gluconate) Soap before surgery.  CHG is an antiseptic cleaner which kills germs and bonds with the skin to continue killing germs even after washing.    Oral Hygiene is also important to reduce your risk of infection.  Remember - BRUSH YOUR TEETH THE MORNING OF SURGERY WITH YOUR REGULAR TOOTHPASTE  Please do not use if  you have an allergy to CHG or antibacterial soaps. If your skin becomes reddened/irritated stop using the CHG.  Do not shave (including legs and underarms) for at least 48 hours prior to first CHG shower. It is OK to shave your face.  Please follow these instructions carefully.   1. Shower the NIGHT BEFORE SURGERY and the MORNING OF SURGERY with CHG Soap.   2. If you chose to wash your hair, wash your hair first as usual with your normal shampoo.  3. After you shampoo, rinse your hair and body thoroughly to remove the shampoo.  4. Use CHG as you would any other liquid soap. You can apply  CHG directly to the skin and wash gently with a scrungie or a clean washcloth.   5. Apply the CHG Soap to your body ONLY FROM THE NECK DOWN.  Do not use on open wounds or open sores. Avoid contact with your eyes, ears, mouth and genitals (private parts). Wash Face and genitals (private parts)  with your normal soap.   6. Wash thoroughly, paying special attention to the area where your surgery will be performed.  7. Thoroughly rinse your body with warm water from the neck down.  8. DO NOT shower/wash with your normal soap after using and rinsing off the CHG Soap.  9. Pat yourself dry with a CLEAN TOWEL.  10. Wear CLEAN PAJAMAS to bed the night before surgery  11. Place CLEAN SHEETS on your bed the night of your first shower and DO NOT SLEEP WITH PETS.   Day of Surgery: Shower with CHG soap as directed Wear Clean/Comfortable clothing the morning of surgery Do not apply any deodorants/lotions.   Remember to brush your teeth WITH YOUR REGULAR TOOTHPASTE.   Please read over the following fact sheets that you were given.

## 2020-05-19 ENCOUNTER — Encounter: Payer: Self-pay | Admitting: Pharmacist

## 2020-05-19 ENCOUNTER — Ambulatory Visit (HOSPITAL_COMMUNITY)
Admission: RE | Admit: 2020-05-19 | Discharge: 2020-05-19 | Disposition: A | Discharge status: Home / Self Care | Payer: Medicare PPO | Source: Ambulatory Visit | Attending: Surgery | Admitting: Surgery

## 2020-05-19 ENCOUNTER — Encounter (HOSPITAL_COMMUNITY)
Admission: RE | Admit: 2020-05-19 | Discharge: 2020-05-19 | Disposition: A | Discharge status: Home / Self Care | Payer: Medicare PPO | Source: Ambulatory Visit | Attending: Orthopaedic Surgery | Admitting: Orthopaedic Surgery

## 2020-05-19 ENCOUNTER — Other Ambulatory Visit: Payer: Self-pay

## 2020-05-19 ENCOUNTER — Ambulatory Visit: Payer: Medicare PPO | Attending: Internal Medicine | Admitting: Pharmacist

## 2020-05-19 ENCOUNTER — Encounter (HOSPITAL_COMMUNITY): Payer: Self-pay

## 2020-05-19 VITALS — BP 144/84

## 2020-05-19 DIAGNOSIS — Z87891 Personal history of nicotine dependence: Secondary | ICD-10-CM | POA: Insufficient documentation

## 2020-05-19 DIAGNOSIS — I2510 Atherosclerotic heart disease of native coronary artery without angina pectoris: Secondary | ICD-10-CM | POA: Insufficient documentation

## 2020-05-19 DIAGNOSIS — M48061 Spinal stenosis, lumbar region without neurogenic claudication: Secondary | ICD-10-CM | POA: Diagnosis not present

## 2020-05-19 DIAGNOSIS — E119 Type 2 diabetes mellitus without complications: Secondary | ICD-10-CM | POA: Diagnosis not present

## 2020-05-19 DIAGNOSIS — Z853 Personal history of malignant neoplasm of breast: Secondary | ICD-10-CM | POA: Diagnosis not present

## 2020-05-19 DIAGNOSIS — I10 Essential (primary) hypertension: Secondary | ICD-10-CM | POA: Diagnosis not present

## 2020-05-19 DIAGNOSIS — Z8379 Family history of other diseases of the digestive system: Secondary | ICD-10-CM | POA: Diagnosis not present

## 2020-05-19 DIAGNOSIS — Z01818 Encounter for other preprocedural examination: Secondary | ICD-10-CM | POA: Insufficient documentation

## 2020-05-19 DIAGNOSIS — Z951 Presence of aortocoronary bypass graft: Secondary | ICD-10-CM | POA: Diagnosis not present

## 2020-05-19 DIAGNOSIS — Z8673 Personal history of transient ischemic attack (TIA), and cerebral infarction without residual deficits: Secondary | ICD-10-CM | POA: Diagnosis not present

## 2020-05-19 DIAGNOSIS — Z7982 Long term (current) use of aspirin: Secondary | ICD-10-CM | POA: Insufficient documentation

## 2020-05-19 DIAGNOSIS — Z7984 Long term (current) use of oral hypoglycemic drugs: Secondary | ICD-10-CM | POA: Insufficient documentation

## 2020-05-19 DIAGNOSIS — Z79899 Other long term (current) drug therapy: Secondary | ICD-10-CM | POA: Insufficient documentation

## 2020-05-19 DIAGNOSIS — Z8349 Family history of other endocrine, nutritional and metabolic diseases: Secondary | ICD-10-CM | POA: Diagnosis not present

## 2020-05-19 LAB — URINALYSIS, ROUTINE W REFLEX MICROSCOPIC
Bilirubin Urine: NEGATIVE
Glucose, UA: NEGATIVE mg/dL
Hgb urine dipstick: NEGATIVE
Ketones, ur: NEGATIVE mg/dL
Leukocytes,Ua: NEGATIVE
Nitrite: NEGATIVE
Protein, ur: 100 mg/dL — AB
Specific Gravity, Urine: >1.03 — ABNORMAL HIGH (ref 1.005–1.030)
pH: 5 (ref 5.0–8.0)

## 2020-05-19 LAB — URINALYSIS, MICROSCOPIC (REFLEX)

## 2020-05-19 LAB — COMPREHENSIVE METABOLIC PANEL
ALT: 13 U/L (ref 0–44)
AST: 19 U/L (ref 15–41)
Albumin: 3.5 g/dL (ref 3.5–5.0)
Alkaline Phosphatase: 54 U/L (ref 38–126)
Anion gap: 12 (ref 5–15)
BUN: 24 mg/dL — ABNORMAL HIGH (ref 8–23)
CO2: 23 mmol/L (ref 22–32)
Calcium: 9.3 mg/dL (ref 8.9–10.3)
Chloride: 104 mmol/L (ref 98–111)
Creatinine, Ser: 1.67 mg/dL — ABNORMAL HIGH (ref 0.44–1.00)
GFR, Estimated: 33 mL/min — ABNORMAL LOW (ref >60)
Glucose, Bld: 94 mg/dL (ref 70–99)
Potassium: 4 mmol/L (ref 3.5–5.1)
Sodium: 139 mmol/L (ref 135–145)
Total Bilirubin: 0.8 mg/dL (ref 0.3–1.2)
Total Protein: 6.7 g/dL (ref 6.5–8.1)

## 2020-05-19 LAB — CBC
HCT: 39.3 % (ref 36.0–46.0)
Hemoglobin: 12.2 g/dL (ref 12.0–15.0)
MCH: 26.1 pg (ref 26.0–34.0)
MCHC: 31 g/dL (ref 30.0–36.0)
MCV: 84.2 fL (ref 80.0–100.0)
Platelets: 299 10*3/uL (ref 150–400)
RBC: 4.67 MIL/uL (ref 3.87–5.11)
RDW: 14.9 % (ref 11.5–15.5)
WBC: 6.6 10*3/uL (ref 4.0–10.5)
nRBC: 0 % (ref 0.0–0.2)

## 2020-05-19 LAB — GLUCOSE, CAPILLARY: Glucose-Capillary: 73 mg/dL (ref 70–99)

## 2020-05-19 LAB — SURGICAL PCR SCREEN
MRSA, PCR: NEGATIVE
Staphylococcus aureus: NEGATIVE

## 2020-05-19 NOTE — Progress Notes (Addendum)
PCP - Karle Plumber Cardiologist - [place]  Chest x-ray - 05-19-20 EKG - 03-27-20 Stress Test - 03-03-20 ECHO - 03-03-20 Cardiac Cath - 04-03-15  SA - yes, does not wear CPAP  DM -Type 2 Fasting Blood Sugar - 95-120   Aspirin Instructions: Follow your surgeon's instructions on when to stop ASA  ERAS Protcol -yes, water given    COVID TEST- 05-23-20   Anesthesia review: yes, heart history Received cardiac clearance from Dr. Fransico Him.  Waiting on clearance from PCP, per Dr. Durenda Age note on 05-15-20.  Patient denies shortness of breath, fever, cough and chest pain at PAT appointment   All instructions explained to the patient, with a verbal understanding of the material. Patient agrees to go over the instructions while at home for a better understanding. Patient also instructed to self quarantine after being tested for COVID-19. The opportunity to ask questions was provided.

## 2020-05-19 NOTE — Progress Notes (Signed)
° °  S:    Patient arrives well and in good spirits.  Presents to the clinic for hypertension evaluation, counseling, and management.   Patient was referred and last seen by Primary Care Provider, Dr. Wynetta Emery, on 05/15/20. At this visit, patient was being seen for pre-op clearance for decompressive lumbar surgery. However, BP was significantly elevated at 189/97 mmHg (patient had not yet taken BP meds). Of note, patient has been seen by cardiologist and has been cleared from their standpoint.   Today, patient presents with home BP log, which show slight improvements. She reports to taking her BP medications this morning.   Medication adherence reported.  Current BP Medications include: amlodipine 10 mg, carvedilol 12.5 mg, hctz 25 mg, losartan 100 mg    O:  Vitals:   05/19/20 1008  BP: (!) 144/84    Home BP readings: (prior to BP medications) 11/21 - 176/85 11/22 - 169/79 11/23 - 163/84  Last 3 Office BP readings: BP Readings from Last 3 Encounters:  05/19/20 (!) 119/44  05/19/20 (!) 144/84  05/15/20 (!) 189/97    BMET    Component Value Date/Time   NA 139 05/19/2020 1449   NA 141 05/15/2020 1104   K 4.0 05/19/2020 1449   CL 104 05/19/2020 1449   CO2 23 05/19/2020 1449   GLUCOSE 94 05/19/2020 1449   BUN 24 (H) 05/19/2020 1449   BUN 9 05/15/2020 1104   CREATININE 1.67 (H) 05/19/2020 1449   CREATININE 1.17 (H) 04/28/2016 1028   CALCIUM 9.3 05/19/2020 1449   GFRNONAA 33 (L) 05/19/2020 1449   GFRNONAA 50 (L) 06/23/2015 1711   GFRAA 52 (L) 05/15/2020 1104   GFRAA 58 (L) 06/23/2015 1711    Renal function: Estimated Creatinine Clearance: 40.2 mL/min (A) (by C-G formula based on SCr of 1.67 mg/dL (H)).  Clinical ASCVD: No  The 10-year ASCVD risk score Mikey Bussing DC Jr., et al., 2013) is: 24.6%   Values used to calculate the score:     Age: 69 years     Sex: Female     Is Non-Hispanic African American: Yes     Diabetic: Yes     Tobacco smoker: No     Systolic Blood  Pressure: 119 mmHg     Is BP treated: Yes     HDL Cholesterol: 82 mg/dL     Total Cholesterol: 206 mg/dL   A/P: Hypertension longstanding currently uncontrolled on current medications, however, BP has improved since last visit. BP Goal = <130/80 mmHg. Medication adherence appears appropriate. -Continue current BP lowering regimen  -Contacting Dr. Wynetta Emery with today's visit findings to assess for pre-op clearance approval -Counseled on lifestyle modifications for blood pressure control including reduced dietary sodium, increased exercise, adequate sleep.  Results reviewed and written information provided.   Total time in face-to-face counseling 10 minutes.    Harriet Pho, PharmD PGY-1 [facility] Pharm[facility] Resident   05/19/2020 4:42 PM

## 2020-05-19 NOTE — Telephone Encounter (Signed)
   Primary Cardiologist: Fransico Him, MD  Chart reviewed as part of pre-operative protocol coverage. Patient was last seen by Dr. Radford Pax in 01/2020 for a virtual visit. At that time she was getting ready to have shoulder surgery. Myoview and Echo were ordered for further risk stratification. Myoview was low risk with reduced counts in inferior segments consistent with diaphragm attenuation but no reversible ischemia. Echo showed LVEF of 60-65% with moderate LVH and grade 2 diastolic dysfunction. Patient now scheduled to have back surgery. Patient was contacted today for further pre-op evaluation and reported doing well since last visit. No chest pain, significant shortness of breath, orthopnea, palpitations, syncope. Given past medical history and time since last visit, based on ACC/AHA guidelines, Amely Voorheis would be at acceptable risk for the planned procedure without further cardiovascular testing.   Per Dr. Radford Pax, Aspirin can be held as needed prior to procedure. This should be restarted as soon as able following procedure.   I will route this recommendation to the requesting party via Epic fax function and remove from pre-op pool.  Please call with questions.  Darreld Mclean, PA-C 05/19/2020, 12:14 PM

## 2020-05-20 ENCOUNTER — Ambulatory Visit (INDEPENDENT_AMBULATORY_CARE_PROVIDER_SITE_OTHER): Payer: Medicare PPO | Admitting: Surgery

## 2020-05-20 ENCOUNTER — Encounter: Payer: Self-pay | Admitting: Surgery

## 2020-05-20 ENCOUNTER — Telehealth: Payer: Self-pay | Admitting: Internal Medicine

## 2020-05-20 VITALS — BP 169/76 | HR 76 | Ht 67.0 in | Wt 238.6 lb

## 2020-05-20 DIAGNOSIS — M48062 Spinal stenosis, lumbar region with neurogenic claudication: Secondary | ICD-10-CM

## 2020-05-20 NOTE — Telephone Encounter (Signed)
Contacted pt to go over Dr. Johnson message pt is aware and doesn't have any questions or concerns  

## 2020-05-20 NOTE — Telephone Encounter (Signed)
Dr. Wynetta Emery will sign Clearance on Monday

## 2020-05-20 NOTE — Progress Notes (Signed)
69 year old black female with history of L2-3 and L3-4 stenosis comes in for preop evaluation.  She continues have ongoing symptoms that are unchanged from previous visit.  She is wanting to proceed with L2-3 and L3-4 decompression as scheduled.  We are awaiting preop medical and cardiac clearances.  Patient states that she has a history of sleep apnea that was diagnosed several years ago and CPAP had been recommended but she does not use this.  Surgical procedure discussed with patient and family members that were on speaker phone.  All questions answered.

## 2020-05-20 NOTE — Telephone Encounter (Signed)
-----   Message from [name], MD sent at 05/19/2020 10:09 PM EST ----- Regarding: RE: Pre-op Noticed todays lab results pre-op creatinine  went from 1.21 to 1.67.  ( was 1.4 before that ) thank you for your note.  ----- Message ----- From: [name], MD Sent: 05/19/2020   9:17 PM EST To: [name], MD Subject: Pre-op                                         I am PCP for this pt.  See my pre-op note.

## 2020-05-20 NOTE — Telephone Encounter (Signed)
Debbie, from Dr. [name] office, called and is requesting to know if the pts surgical clearance forms sent on 05/19/20 can be signed and faxed back over to the office. Please advise.    Callback#: [phone]   Fax#: [phone]

## 2020-05-20 NOTE — Progress Notes (Signed)
Anesthesia Chart Review:  Case: [id number] Date/Time: 05/27/20 1505   Procedure: L2-3, L3-4  decompression (N/A )   Anesthesia type: General   Pre-op diagnosis: L2-3, L3-4 lumbar stenosis   Location: [facility] OR ROOM 03 / [facility]   Surgeons: [name], MD      DISCUSSION: Patient is a 69 year old female scheduled for the above procedure.  History includes former smoker (quit 10/14/10), CAD with mitral valve fibroelastoma (s/p CABG: SVG-D1 and SVG-PDA and resection of MV papillary fibroelastoma with MV repair 07/20/19), HTN, DM2, CVA (11/2009, work-up showed high grade proximal right MCA stenosis and mitral valve mass treated with warfarin; TIA 05/2010 in setting of warfarin non-compliance, cocaine), iron deficiency anemia, GERD, HLD, CAP with acute hypoxic respiratory failure (01/2013), epidural abscess (03/2006), breast cancer (right breast lumpectomy 07/21/17, radiation), back surgery (L2-3 discectomy 04/15/15), OSA (does not use CPAP). BMI is consistent with obesity.  - Preoperative medical evaluation 05/15/20 by [name], MD who wrote,  "Pre-operative clearance Patient with high cardiac risk index.  However she has been seen by her cardiologist and has been cleared from their standpoint.  However blood pressure is elevated today.  She has not taken medicines as yet for today.  We will have her follow-up with our clinical pharmacist in 1 week for repeat blood pressure.  If blood pressure is better we will go ahead and clear her for surgery." Dr. [name] did forward Creatinine results to Dr. Wynetta Emery. BP 119/44 at PAT.  - Preoperative cardiology input outlined by Sande Rives, PA-C on 05/18/20: " Patient was last seen by Dr. Radford Pax in 01/2020 for a virtual visit. At that time she was getting ready to have shoulder surgery. Myoview and Echo were ordered for further risk stratification. Myoview was low risk with reduced counts in inferior segments consistent with diaphragm attenuation but no  reversible ischemia. Echo showed LVEF of 60-65% with moderate LVH and grade 2 diastolic dysfunction. Patient now scheduled to have back surgery. Patient was contacted today for further pre-op evaluation and reported doing well since last visit. No chest pain, significant shortness of breath, orthopnea, palpitations, syncope. Given past medical history and time since last visit, based on ACC/AHA guidelines, [name] would be at acceptable risk for the planned procedure without further cardiovascular testing.   Per Dr. Radford Pax, Aspirin can be held as needed prior to procedure. This should be restarted as soon as able following procedure."  Preoperative COVID-19 test is scheduled for 05/23/2020.  Anesthesia team to evaluate on the day of surgery.   VS: BP (!) 119/44   Pulse 77   Temp 36.9 C (Oral)   Resp 18   Ht 5' 7.75" (1.721 m)   Wt 108.1 kg   SpO2 98%   BMI 36.49 kg/m    PROVIDERS: [name], MD is PCP  Fransico Him, MD is cardiologist Nicholas Lose, MD is HEM-ONC   LABS: Preoperative labs noted. Cr 1.67 (previously 1.21-1.55 since 02/2020).   (all labs ordered are listed, but only abnormal results are displayed)  Labs Reviewed  COMPREHENSIVE METABOLIC PANEL - Abnormal; Notable for the following components:      Result Value   BUN 24 (*)    Creatinine, Ser 1.67 (*)    GFR, Estimated 33 (*)    All other components within normal limits  URINALYSIS, ROUTINE W REFLEX MICROSCOPIC - Abnormal; Notable for the following components:   Specific Gravity, Urine >1.030 (*)    Protein, ur 100 (*)  All other components within normal limits  URINALYSIS, MICROSCOPIC (REFLEX) - Abnormal; Notable for the following components:   Bacteria, UA FEW (*)    All other components within normal limits  SURGICAL PCR SCREEN  GLUCOSE, CAPILLARY  CBC     IMAGES: CXR 05/19/20: FINDINGS: Prior CABG. Heart and mediastinal contours are within normal limits. No focal  opacities or effusions. No acute bony abnormality. IMPRESSION: No active cardiopulmonary disease.  MRI L-spine 02/28/20: IMPRESSION: 1. Mildly progressive disc degeneration at L2-3 with a new small right paracentral disc extrusion. Moderate to severe spinal and bilateral neural foraminal stenosis. 2. Unchanged moderate to severe spinal and neural foraminal stenosis at L3-4. 3. Unchanged moderate bilateral foraminal stenosis at L4-5.   EKG: 03/22/2020: Normal sinus rhythm ST & T wave abnormality, consider lateral ischemia Abnormal ECG When compared with ECG of 05/13/2019, T wave inversion Lateral leads is less pronounced Confirmed by Delora Fuel ([id number]) on 03/23/2020 12:34:34 AM - Baseline wanderer is present, worse in aVL   CV: Echo 03/03/20: IMPRESSIONS  1. Left ventricular ejection fraction, by estimation, is 60 to 65%. The  left ventricle has normal function. The left ventricle has no regional  wall motion abnormalities. There is moderate left ventricular hypertrophy.  Left ventricular diastolic  parameters are consistent with Grade II diastolic dysfunction  (pseudonormalization).  2. Right ventricular systolic function is normal. The right ventricular  size is normal. There is normal pulmonary artery systolic pressure. The  estimated right ventricular systolic pressure is 47.6 mmHg.  3. Status post mitral valve repair. No significant stenosis or  regurgitation.  4. The aortic valve is tricuspid. Aortic valve regurgitation is not  visualized. Mild aortic valve sclerosis is present, with no evidence of  aortic valve stenosis.  5. The inferior vena cava is normal in size with greater than 50%  respiratory variability, suggesting right atrial pressure of 3 mmHg.    Nuclear stress test 03/03/20:  Nuclear stress EF: 55%.  There was no ST segment deviation noted during stress.  No T wave inversion was noted during stress.  The study is normal.  This is a low risk  study.  The left ventricular ejection fraction is normal (55-65%).  1.  This is a likely normal myocardial perfusion imaging study without evidence of ischemia or infarction.  There are reduced counts in the inferior segments consistent with diaphragm attenuation. 2.  Normal LV function, EF 55%. 3.  This is a low risk study.   Cardiac cath 04/03/15:  Mid RCA lesion, 95% stenosed.  Mid RCA to Dist RCA lesion, 100% stenosed.  Ost Ramus to Ramus lesion, 95% stenosed. This is a tiny branch.  2nd Diag lesion, 100% stenosed.  Prox Cx to Mid Cx lesion, 30% stenosed.  SVG to PDA was injected is normal in caliber, and is anatomically normal.  SVG to second diagonal was injected is normal in caliber, and is anatomically normal.  The left ventricular systolic function is normal. 1. Severe 2 vessel obstructive CAD. The ramus branch is tiny.  2. Patent SVG to PDA 3. Patent SVG to second diagonal 4. Normal LV function 5. No MR Plan: continue medical therapy.    Past Medical History:  Diagnosis Date  . Abscess in epidural space of L2-L5 lumbar spine 03/2006  . Anemia, iron deficiency   . Arthritis   . Breast cancer ([place]) 06/12/2017   right breast  . CAD (coronary artery disease), native coronary artery 03/30/2015  . Cataract   . Colon polyp  a. Multiple colonic polyps status post colonoscopy in October 2007, consistent with tubular adenoma, tubulovillous adenoma with no high-grade dysplasia or malignancy identified.   . Constipation   . Coronary artery disease    a. s/p CABG 06/2010: S-D2; S-PDA (at time of MV surgery).  . Depression   . Diabetes mellitus    type II  . Family history of breast cancer   . Family history of prostate cancer   . Genetic testing 09/25/2017   Multi-Cancer panel (83 genes) @ [facility] - No pathogenic mutations detected  . GERD (gastroesophageal reflux disease)   . Hx of transient ischemic attack (TIA)    a. See stroke section.  . Hyperlipidemia   .  Hypertension   . Hypoxia    a. Has history of acute hypoxic respiratory failure in the setting of bronchitis/PNA or prior admissions.  [name] MRSA infection    a. History of recurrent skin infection and soft tissue abscesses, with MRSA positive in the past.  . 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 mitral valve - s/p resection and MV repair 06/2010 Dr. [name].  . 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 Personal history of radiation therapy   . Sleep apnea    does not use CPAP  . Stenosis of middle cerebral artery    a. Distal R MCA.  . Stroke [facility])    a. 11/2009: mitral mass diagnosed at this time, also has distal R MCA stenosis, tx with coumadin. b. Readmitted 05/2010 with TIA symptoms - had not been taking Coumadin. s/p MV surgery 06/2010. Coumadin stopped 2013 after review of chart by Dr. [name] since mass was removed (stroke felt possibly related to this).     Past Surgical History:  Procedure Laterality Date  . BREAST BIOPSY Right 08/26/2019   benign  . BREAST LUMPECTOMY Right   . BREAST LUMPECTOMY WITH RADIOACTIVE SEED AND SENTINEL LYMPH NODE BIOPSY Right 07/20/2017   Procedure: RIGHT BREAST LUMPECTOMY WITH RADIOACTIVE SEED AND SENTINEL LYMPH NODE BIOPSY;  Surgeon: Alphonsa Overall, MD;  Location: [place];  Service: General;  Laterality: Right;  . CARDIAC CATHETERIZATION  04/03/2015   Procedure: Left Heart Cath and Cors/Grafts Angiography;  Surgeon: Peter M Martinique, MD;  Location: [place] CV LAB;  Service: Cardiovascular;;  . CORONARY ARTERY BYPASS GRAFT  1/12  . history of ankle fractures requiring surgery    . LUMBAR LAMINECTOMY/DECOMPRESSION MICRODISCECTOMY Left 04/15/2015   Procedure: LUMBAR LAMINECTOMY/DECOMPRESSION MICRODISCECTOMY 1 LEVEL;  Surgeon: Newman Pies, MD;  Location: [place] NEURO ORS;  Service: Neurosurgery;  Laterality: Left;  Left L23 microdiskectomy  . MV repair and resection of mass  1/12  . RESECTION DISTAL CLAVICAL Left 05/16/2019   Procedure: RESECTION  DISTAL CLAVICAL;  Surgeon: Mcarthur Rossetti, MD;  Location: [place];  Service: Orthopedics;  Laterality: Left;  . SHOULDER ARTHROSCOPY WITH ROTATOR CUFF REPAIR AND SUBACROMIAL DECOMPRESSION Left 05/16/2019   Procedure: SHOULDER ARTHROSCOPY WITH ROTATOR CUFF REPAIR AND SUBACROMIAL DECOMPRESSION;  Surgeon: Mcarthur Rossetti, MD;  Location: [employer];  Service: Orthopedics;  Laterality: Left;  . TONSILLECTOMY  1971  . TOTAL ABDOMINAL HYSTERECTOMY  1978  . VESICOVAGINAL FISTULA CLOSURE W/ TAH      MEDICATIONS: . ACCU-CHEK SOFTCLIX LANCETS lancets  . albuterol (PROVENTIL HFA;VENTOLIN HFA) 108 (90 Base) MCG/ACT inhaler  . amLODipine (NORVASC) 10 MG tablet  . Ascorbic Acid (VITAMIN C PO)  . aspirin EC 81 MG tablet  . atorvastatin (LIPITOR) 80 MG tablet  . baclofen (  LIORESAL) 10 MG tablet  . Blood Glucose Monitoring Suppl (ACCU-CHEK AVIVA PLUS) w/Device KIT  . buPROPion (WELLBUTRIN XL) 150 MG 24 hr tablet  . carvedilol (COREG) 12.5 MG tablet  . diclofenac Sodium (VOLTAREN) 1 % GEL  . ferrous sulfate (FEROSUL) 325 (65 FE) MG tablet  . gabapentin (NEURONTIN) 100 MG capsule  . glipiZIDE (GLUCOTROL) 5 MG tablet  . glucose blood (ACCU-CHEK AVIVA PLUS) test strip  . hydrochlorothiazide (HYDRODIURIL) 25 MG tablet  . letrozole (FEMARA) 2.5 MG tablet  . linagliptin (TRADJENTA) 5 MG TABS tablet  . losartan (COZAAR) 100 MG tablet  . Melatonin 10 MG TABS  . metFORMIN (GLUCOPHAGE) 1000 MG tablet  . pantoprazole (PROTONIX) 40 MG tablet  . polyethylene glycol (MIRALAX / GLYCOLAX) 17 g packet  . traMADol (ULTRAM) 50 MG tablet  . traZODone (DESYREL) 50 MG tablet   No current facility-administered medications for this encounter.  Advised to follow surgeon instructions regarding perioperative ASA.   Myra Gianotti, PA-C Surgical Short Stay/Anesthesiology [facility] Phone [phone] [facility] Phone [phone] 05/20/2020 6:12 PM

## 2020-05-20 NOTE — Anesthesia Preprocedure Evaluation (Addendum)
Anesthesia Evaluation  Patient identified by MRN, date of birth, ID band Patient awake    Reviewed: Allergy & Precautions, NPO status , Patient's Chart, lab work & pertinent test results  Airway Mallampati: II  TM Distance: >3 FB Neck ROM: Full    Dental  (+) Teeth Intact, Dental Advisory Given   Pulmonary former smoker,    breath sounds clear to auscultation       Cardiovascular hypertension,  Rhythm:Regular Rate:Normal     Neuro/Psych    GI/Hepatic   Endo/Other  diabetes  Renal/GU      Musculoskeletal   Abdominal   Peds  Hematology   Anesthesia Other Findings   Reproductive/Obstetrics                            Anesthesia Physical Anesthesia Plan  ASA: III  Anesthesia Plan: General   Post-op Pain Management:    Induction: Intravenous  PONV Risk Score and Plan: Ondansetron  Airway Management Planned: Oral ETT  Additional Equipment:   Intra-op Plan:   Post-operative Plan: Extubation in OR  Informed Consent: I have reviewed the patients History and Physical, chart, labs and discussed the procedure including the risks, benefits and alternatives for the proposed anesthesia with the patient or authorized representative who has indicated his/her understanding and acceptance.     Dental advisory given  Plan Discussed with: CRNA and Anesthesiologist  Anesthesia Plan Comments: (PAT note written 05/20/2020 by Myra Gianotti, PA-C. )       Anesthesia Quick Evaluation

## 2020-05-23 ENCOUNTER — Other Ambulatory Visit (HOSPITAL_COMMUNITY)
Admission: RE | Admit: 2020-05-23 | Discharge: 2020-05-23 | Disposition: A | Discharge status: Home / Self Care | Payer: Medicare PPO | Source: Ambulatory Visit | Attending: Orthopaedic Surgery | Admitting: Orthopaedic Surgery

## 2020-05-23 DIAGNOSIS — Z20822 Contact with and (suspected) exposure to covid-19: Secondary | ICD-10-CM | POA: Insufficient documentation

## 2020-05-23 DIAGNOSIS — Z01812 Encounter for preprocedural laboratory examination: Secondary | ICD-10-CM | POA: Diagnosis not present

## 2020-05-24 LAB — SARS CORONAVIRUS 2 (TAT 6-24 HRS): SARS Coronavirus 2: NEGATIVE

## 2020-05-25 NOTE — Telephone Encounter (Signed)
[name] will be refaxing another form for Dr. Wynetta Emery to sign

## 2020-05-25 NOTE — Telephone Encounter (Signed)
Patient called in to speak to Dr Bard Herbert nurse to remind her that she will be having surgery this Wednesday 05/27/20 and need her form to be returned today please. Also asking for a call back when this is been done at  Ph# [phone]

## 2020-05-26 NOTE — Telephone Encounter (Signed)
Faxed medical clearance this morning

## 2020-05-26 NOTE — H&P (Signed)
[name] is an 69 y.o. female.    Chief Complaint: Low back pain and lower extremity radiculopathy  HPI:  69 year old black female with history of L2-3 and L3-4 stenosis comes in for preop evaluation.  She continues have ongoing symptoms that are unchanged from previous visit.  She is wanting to proceed with L2-3 and L3-4 decompression as scheduled.  We are awaiting preop medical and cardiac clearances.  Patient states that she has a history of sleep apnea that was diagnosed several years ago and CPAP had been recommended but she does not use this.  Past Medical History:  Diagnosis Date  . Abscess in epidural space of L2-L5 lumbar spine 03/2006  . Anemia, iron deficiency   . Arthritis   . Breast cancer ([place]) 06/12/2017   right breast  . CAD (coronary artery disease), native coronary artery 03/30/2015  . Cataract   . Colon polyp    a. Multiple colonic polyps status post colonoscopy in October 2007, consistent with tubular adenoma, tubulovillous adenoma with no high-grade dysplasia or malignancy identified.   . Constipation   . Coronary artery disease    a. s/p CABG 06/2010: S-D2; S-PDA (at time of MV surgery).  . Depression   . Diabetes mellitus    type II  . Family history of breast cancer   . Family history of prostate cancer   . Genetic testing 09/25/2017   Multi-Cancer panel (83 genes) @ [facility] - No pathogenic mutations detected  . GERD (gastroesophageal reflux disease)   . Hx of transient ischemic attack (TIA)    a. See stroke section.  . Hyperlipidemia   . Hypertension   . Hypoxia    a. Has history of acute hypoxic respiratory failure in the setting of bronchitis/PNA or prior admissions.  [name] MRSA infection    a. History of recurrent skin infection and soft tissue abscesses, with MRSA positive in the past.  . 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 mitral valve - s/p resection and MV repair 06/2010 Dr. [name].  . 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  Personal history of radiation therapy   . Sleep apnea    does not use CPAP  . Stenosis of middle cerebral artery    a. Distal R MCA.  . Stroke [facility])    a. 11/2009: mitral mass diagnosed at this time, also has distal R MCA stenosis, tx with coumadin. b. Readmitted 05/2010 with TIA symptoms - had not been taking Coumadin. s/p MV surgery 06/2010. Coumadin stopped 2013 after review of chart by Dr. [name] since mass was removed (stroke felt possibly related to this).     Past Surgical History:  Procedure Laterality Date  . BREAST BIOPSY Right 08/26/2019   benign  . BREAST LUMPECTOMY Right   . BREAST LUMPECTOMY WITH RADIOACTIVE SEED AND SENTINEL LYMPH NODE BIOPSY Right 07/20/2017   Procedure: RIGHT BREAST LUMPECTOMY WITH RADIOACTIVE SEED AND SENTINEL LYMPH NODE BIOPSY;  Surgeon: Alphonsa Overall, MD;  Location: [place];  Service: General;  Laterality: Right;  . CARDIAC CATHETERIZATION  04/03/2015   Procedure: Left Heart Cath and Cors/Grafts Angiography;  Surgeon: Peter M Martinique, MD;  Location: [place] CV LAB;  Service: Cardiovascular;;  . CORONARY ARTERY BYPASS GRAFT  1/12  . history of ankle fractures requiring surgery    . LUMBAR LAMINECTOMY/DECOMPRESSION MICRODISCECTOMY Left 04/15/2015   Procedure: LUMBAR LAMINECTOMY/DECOMPRESSION MICRODISCECTOMY 1 LEVEL;  Surgeon: Newman Pies, MD;  Location: [place] NEURO ORS;  Service: Neurosurgery;  Laterality: Left;  Left  L23 microdiskectomy  . MV repair and resection of mass  1/12  . RESECTION DISTAL CLAVICAL Left 05/16/2019   Procedure: RESECTION DISTAL CLAVICAL;  Surgeon: Mcarthur Rossetti, MD;  Location: [place];  Service: Orthopedics;  Laterality: Left;  . SHOULDER ARTHROSCOPY WITH ROTATOR CUFF REPAIR AND SUBACROMIAL DECOMPRESSION Left 05/16/2019   Procedure: SHOULDER ARTHROSCOPY WITH ROTATOR CUFF REPAIR AND SUBACROMIAL DECOMPRESSION;  Surgeon: Mcarthur Rossetti, MD;  Location: [place];  Service:  Orthopedics;  Laterality: Left;  . TONSILLECTOMY  1971  . TOTAL ABDOMINAL HYSTERECTOMY  1978  . VESICOVAGINAL FISTULA CLOSURE W/ TAH      Family History  Problem Relation Age of Onset  . Colon cancer Mother        dx 70s; deceased 53  . Irritable bowel syndrome Mother   . Prostate cancer Father        deceased 88s  . Cirrhosis Sister        died of GI bleed associated with cirrhosis of the liver  . Stroke Brother   . Prostate cancer Brother 59       deceased 11  . Diabetes Brother        x3  . Kidney disease Brother   . Cancer Brother 64       unk. type  . Breast cancer Cousin        several maternal 1st and 2nd cousins with breast cancer   Social History:  reports that she quit smoking about 9 years ago. She has never used smokeless tobacco. She reports current alcohol use. She reports that she does not use drugs.  Allergies: No Known Allergies  No medications prior to admission.    No results found for this or any previous visit (from the past 48 hour(s)). No results found.  Review of Systems  There were no vitals taken for this visit. Physical Exam HENT:     Head: Normocephalic and atraumatic.     Nose: Nose normal.  Eyes:     Extraocular Movements: Extraocular movements intact.     Pupils: Pupils are equal, round, and reactive to light.  Cardiovascular:     Rate and Rhythm: Regular rhythm.  Pulmonary:     Effort: Pulmonary effort is normal. No respiratory distress.     Breath sounds: Normal breath sounds.  Abdominal:     General: There is no distension.  Musculoskeletal:        General: Tenderness present.     Cervical back: Normal range of motion.  Skin:    General: Skin is warm and dry.  Neurological:     General: No focal deficit present.     Mental Status: She is alert and oriented to person, place, and time.  Psychiatric:        Mood and Affect: Mood normal.      Assessment/Plan L2-3 and L3-4 stenosis.  Low back pain and lower extremity  radiculopathy.   We will proceed with L2-3 and L3-4 decompression scheduled.  Surgery procedure discussed along with potential recovery time.  All questions answered and she wishes to proceed.  Awaiting preop medical and cardiac clearances  Benjiman Core, PA-C 05/26/2020, 10:05 AM

## 2020-05-27 ENCOUNTER — Other Ambulatory Visit: Payer: Self-pay

## 2020-05-27 ENCOUNTER — Ambulatory Visit (HOSPITAL_COMMUNITY): Payer: Medicare PPO

## 2020-05-27 ENCOUNTER — Ambulatory Visit (HOSPITAL_COMMUNITY): Payer: Medicare PPO | Admitting: Vascular Surgery

## 2020-05-27 ENCOUNTER — Observation Stay (HOSPITAL_COMMUNITY)
Admission: RE | Admit: 2020-05-27 | Discharge: 2020-05-28 | Disposition: A | Discharge status: Home / Self Care | Payer: Medicare PPO | Attending: Orthopaedic Surgery | Admitting: Orthopaedic Surgery

## 2020-05-27 ENCOUNTER — Ambulatory Visit (HOSPITAL_COMMUNITY): Payer: Medicare PPO | Admitting: Anesthesiology

## 2020-05-27 ENCOUNTER — Encounter (HOSPITAL_COMMUNITY): Payer: Self-pay | Admitting: Orthopaedic Surgery

## 2020-05-27 ENCOUNTER — Encounter (HOSPITAL_COMMUNITY)
Admission: RE | Disposition: A | Discharge status: Home / Self Care | Payer: Self-pay | Source: Home / Self Care | Attending: Orthopaedic Surgery

## 2020-05-27 DIAGNOSIS — Z79899 Other long term (current) drug therapy: Secondary | ICD-10-CM | POA: Diagnosis not present

## 2020-05-27 DIAGNOSIS — I10 Essential (primary) hypertension: Secondary | ICD-10-CM | POA: Diagnosis not present

## 2020-05-27 DIAGNOSIS — M48062 Spinal stenosis, lumbar region with neurogenic claudication: Secondary | ICD-10-CM | POA: Diagnosis not present

## 2020-05-27 DIAGNOSIS — M5416 Radiculopathy, lumbar region: Principal | ICD-10-CM | POA: Insufficient documentation

## 2020-05-27 DIAGNOSIS — I2510 Atherosclerotic heart disease of native coronary artery without angina pectoris: Secondary | ICD-10-CM | POA: Insufficient documentation

## 2020-05-27 DIAGNOSIS — M545 Low back pain, unspecified: Secondary | ICD-10-CM | POA: Diagnosis present

## 2020-05-27 DIAGNOSIS — Z87891 Personal history of nicotine dependence: Secondary | ICD-10-CM | POA: Diagnosis not present

## 2020-05-27 DIAGNOSIS — E119 Type 2 diabetes mellitus without complications: Secondary | ICD-10-CM | POA: Insufficient documentation

## 2020-05-27 DIAGNOSIS — Z853 Personal history of malignant neoplasm of breast: Secondary | ICD-10-CM | POA: Diagnosis not present

## 2020-05-27 DIAGNOSIS — M47816 Spondylosis without myelopathy or radiculopathy, lumbar region: Secondary | ICD-10-CM | POA: Diagnosis not present

## 2020-05-27 DIAGNOSIS — E785 Hyperlipidemia, unspecified: Secondary | ICD-10-CM | POA: Diagnosis not present

## 2020-05-27 DIAGNOSIS — Z419 Encounter for procedure for purposes other than remedying health state, unspecified: Secondary | ICD-10-CM

## 2020-05-27 DIAGNOSIS — M48061 Spinal stenosis, lumbar region without neurogenic claudication: Secondary | ICD-10-CM | POA: Diagnosis present

## 2020-05-27 DIAGNOSIS — Z981 Arthrodesis status: Secondary | ICD-10-CM | POA: Diagnosis not present

## 2020-05-27 DIAGNOSIS — Z951 Presence of aortocoronary bypass graft: Secondary | ICD-10-CM | POA: Insufficient documentation

## 2020-05-27 HISTORY — PX: LUMBAR LAMINECTOMY/DECOMPRESSION MICRODISCECTOMY: SHX5026

## 2020-05-27 LAB — GLUCOSE, CAPILLARY
Glucose-Capillary: 134 mg/dL — ABNORMAL HIGH (ref 70–99)
Glucose-Capillary: 166 mg/dL — ABNORMAL HIGH (ref 70–99)
Glucose-Capillary: 277 mg/dL — ABNORMAL HIGH (ref 70–99)

## 2020-05-27 SURGERY — LUMBAR LAMINECTOMY/DECOMPRESSION MICRODISCECTOMY
Anesthesia: General | Site: Spine Lumbar

## 2020-05-27 MED ORDER — CARVEDILOL 12.5 MG PO TABS
12.5000 mg | ORAL_TABLET | Freq: Two times a day (BID) | ORAL | Status: DC
  Administered 2020-05-27 – 2020-05-28 (×2): 12.5 mg via ORAL
  Filled 2020-05-27 (×2): qty 1

## 2020-05-27 MED ORDER — SODIUM CHLORIDE 0.9 % IV SOLN: 250.0000 mL | INTRAVENOUS | Status: DC

## 2020-05-27 MED ORDER — MELATONIN 3 MG PO TABS
9.0000 mg | ORAL_TABLET | Freq: Every day | ORAL | Status: DC
  Administered 2020-05-27: 9 mg via ORAL
  Filled 2020-05-27: qty 3

## 2020-05-27 MED ORDER — ONDANSETRON HCL 4 MG/2ML IJ SOLN: 4.0000 mg | Freq: Four times a day (QID) | INTRAMUSCULAR | Status: DC | PRN

## 2020-05-27 MED ORDER — CHLORHEXIDINE GLUCONATE 0.12 % MT SOLN
OROMUCOSAL | Status: AC
  Administered 2020-05-27: 15 mL via OROMUCOSAL
  Filled 2020-05-27: qty 15

## 2020-05-27 MED ORDER — METHOCARBAMOL 1000 MG/10ML IJ SOLN
500.0000 mg | Freq: Four times a day (QID) | INTRAVENOUS | Status: DC | PRN
  Filled 2020-05-27: qty 5

## 2020-05-27 MED ORDER — BUPROPION HCL ER (XL) 150 MG PO TB24
150.0000 mg | ORAL_TABLET | Freq: Two times a day (BID) | ORAL | Status: DC
  Administered 2020-05-27 – 2020-05-28 (×2): 150 mg via ORAL
  Filled 2020-05-27 (×3): qty 1

## 2020-05-27 MED ORDER — ONDANSETRON HCL 4 MG/2ML IJ SOLN
INTRAMUSCULAR | Status: AC
  Filled 2020-05-27: qty 2

## 2020-05-27 MED ORDER — LACTATED RINGERS IV SOLN: INTRAVENOUS | Status: DC

## 2020-05-27 MED ORDER — ACETAMINOPHEN 325 MG PO TABS
650.0000 mg | ORAL_TABLET | ORAL | Status: DC | PRN
  Administered 2020-05-27 – 2020-05-28 (×3): 650 mg via ORAL
  Filled 2020-05-27 (×3): qty 2

## 2020-05-27 MED ORDER — DEXAMETHASONE SODIUM PHOSPHATE 10 MG/ML IJ SOLN
INTRAMUSCULAR | Status: AC
  Filled 2020-05-27: qty 1

## 2020-05-27 MED ORDER — LETROZOLE 2.5 MG PO TABS
2.5000 mg | ORAL_TABLET | Freq: Every day | ORAL | Status: DC
  Administered 2020-05-28: 2.5 mg via ORAL
  Filled 2020-05-27 (×2): qty 1

## 2020-05-27 MED ORDER — METHOCARBAMOL 500 MG PO TABS: 500.0000 mg | ORAL_TABLET | Freq: Four times a day (QID) | ORAL | 0 refills | Status: DC

## 2020-05-27 MED ORDER — SODIUM CHLORIDE 0.9% FLUSH: 3.0000 mL | INTRAVENOUS | Status: DC | PRN

## 2020-05-27 MED ORDER — MIDAZOLAM HCL 2 MG/2ML IJ SOLN
INTRAMUSCULAR | Status: DC | PRN
  Administered 2020-05-27: 2 mg via INTRAVENOUS

## 2020-05-27 MED ORDER — LOSARTAN POTASSIUM 50 MG PO TABS
100.0000 mg | ORAL_TABLET | Freq: Every day | ORAL | Status: DC
  Administered 2020-05-28: 100 mg via ORAL
  Filled 2020-05-27 (×2): qty 2

## 2020-05-27 MED ORDER — DEXAMETHASONE SODIUM PHOSPHATE 10 MG/ML IJ SOLN
INTRAMUSCULAR | Status: DC | PRN
  Administered 2020-05-27: 10 mg via INTRAVENOUS

## 2020-05-27 MED ORDER — ONDANSETRON HCL 4 MG PO TABS
4.0000 mg | ORAL_TABLET | Freq: Four times a day (QID) | ORAL | Status: DC | PRN
  Administered 2020-05-28: 4 mg via ORAL
  Filled 2020-05-27: qty 1

## 2020-05-27 MED ORDER — FERROUS SULFATE 325 (65 FE) MG PO TABS
325.0000 mg | ORAL_TABLET | Freq: Two times a day (BID) | ORAL | Status: DC
  Administered 2020-05-28: 325 mg via ORAL
  Filled 2020-05-27: qty 1

## 2020-05-27 MED ORDER — POLYETHYLENE GLYCOL 3350 17 G PO PACK: 17.0000 g | PACK | Freq: Every day | ORAL | Status: DC | PRN

## 2020-05-27 MED ORDER — HYDROCHLOROTHIAZIDE 25 MG PO TABS
25.0000 mg | ORAL_TABLET | Freq: Every day | ORAL | Status: DC
  Administered 2020-05-28: 25 mg via ORAL
  Filled 2020-05-27 (×2): qty 1

## 2020-05-27 MED ORDER — SODIUM CHLORIDE 0.9% FLUSH: 3.0000 mL | Freq: Two times a day (BID) | INTRAVENOUS | Status: DC

## 2020-05-27 MED ORDER — ATORVASTATIN CALCIUM 80 MG PO TABS
80.0000 mg | ORAL_TABLET | Freq: Every day | ORAL | Status: DC
  Administered 2020-05-28: 80 mg via ORAL
  Filled 2020-05-27: qty 1

## 2020-05-27 MED ORDER — PROPOFOL 10 MG/ML IV BOLUS
INTRAVENOUS | Status: AC
  Filled 2020-05-27: qty 40

## 2020-05-27 MED ORDER — BUPIVACAINE LIPOSOME 1.3 % IJ SUSP
10.0000 mL | Freq: Once | INTRAMUSCULAR | Status: DC
  Filled 2020-05-27: qty 10

## 2020-05-27 MED ORDER — METHOCARBAMOL 500 MG PO TABS
ORAL_TABLET | ORAL | Status: AC
  Filled 2020-05-27: qty 1

## 2020-05-27 MED ORDER — MENTHOL 3 MG MT LOZG: 1.0000 | LOZENGE | OROMUCOSAL | Status: DC | PRN

## 2020-05-27 MED ORDER — HYDROMORPHONE HCL 1 MG/ML IJ SOLN: 0.5000 mg | INTRAMUSCULAR | Status: DC | PRN

## 2020-05-27 MED ORDER — BUPIVACAINE HCL (PF) 0.25 % IJ SOLN
INTRAMUSCULAR | Status: DC | PRN
  Administered 2020-05-27: 10 mL

## 2020-05-27 MED ORDER — LIDOCAINE 2% (20 MG/ML) 5 ML SYRINGE
INTRAMUSCULAR | Status: DC | PRN
  Administered 2020-05-27: 40 mg via INTRAVENOUS

## 2020-05-27 MED ORDER — FENTANYL CITRATE (PF) 250 MCG/5ML IJ SOLN
INTRAMUSCULAR | Status: AC
  Filled 2020-05-27: qty 5

## 2020-05-27 MED ORDER — MIDAZOLAM HCL 2 MG/2ML IJ SOLN
INTRAMUSCULAR | Status: AC
  Filled 2020-05-27: qty 2

## 2020-05-27 MED ORDER — AMLODIPINE BESYLATE 5 MG PO TABS
10.0000 mg | ORAL_TABLET | Freq: Every day | ORAL | Status: DC
  Administered 2020-05-28: 10 mg via ORAL
  Filled 2020-05-27 (×2): qty 2

## 2020-05-27 MED ORDER — METFORMIN HCL 500 MG PO TABS
1000.0000 mg | ORAL_TABLET | ORAL | Status: AC
  Administered 2020-05-27: 1000 mg via ORAL

## 2020-05-27 MED ORDER — ROCURONIUM BROMIDE 10 MG/ML (PF) SYRINGE
PREFILLED_SYRINGE | INTRAVENOUS | Status: AC
  Filled 2020-05-27: qty 10

## 2020-05-27 MED ORDER — PROPOFOL 10 MG/ML IV BOLUS
INTRAVENOUS | Status: DC | PRN
  Administered 2020-05-27: 150 mg via INTRAVENOUS
  Administered 2020-05-27: 20 mg via INTRAVENOUS
  Administered 2020-05-27: 30 mg via INTRAVENOUS

## 2020-05-27 MED ORDER — GLIPIZIDE 5 MG PO TABS
5.0000 mg | ORAL_TABLET | Freq: Every day | ORAL | Status: DC
  Administered 2020-05-28: 5 mg via ORAL
  Filled 2020-05-27: qty 1

## 2020-05-27 MED ORDER — CEFAZOLIN SODIUM-DEXTROSE 2-4 GM/100ML-% IV SOLN
2.0000 g | INTRAVENOUS | Status: AC
  Administered 2020-05-27: 2 g via INTRAVENOUS

## 2020-05-27 MED ORDER — PHENYLEPHRINE 40 MCG/ML (10ML) SYRINGE FOR IV PUSH (FOR BLOOD PRESSURE SUPPORT)
PREFILLED_SYRINGE | INTRAVENOUS | Status: DC | PRN
  Administered 2020-05-27: 80 ug via INTRAVENOUS

## 2020-05-27 MED ORDER — FENTANYL CITRATE (PF) 100 MCG/2ML IJ SOLN
INTRAMUSCULAR | Status: AC
  Filled 2020-05-27: qty 2

## 2020-05-27 MED ORDER — OXYCODONE-ACETAMINOPHEN 5-325 MG PO TABS
1.0000 | ORAL_TABLET | ORAL | 0 refills | Status: DC | PRN
Start: 2020-05-27 — End: 2020-06-30

## 2020-05-27 MED ORDER — METFORMIN HCL 500 MG PO TABS
1000.0000 mg | ORAL_TABLET | Freq: Two times a day (BID) | ORAL | Status: DC
  Administered 2020-05-28: 1000 mg via ORAL
  Filled 2020-05-27 (×2): qty 2

## 2020-05-27 MED ORDER — PHENOL 1.4 % MT LIQD
1.0000 | OROMUCOSAL | Status: DC | PRN
  Administered 2020-05-28: 1 via OROMUCOSAL
  Filled 2020-05-27: qty 177

## 2020-05-27 MED ORDER — LINAGLIPTIN 5 MG PO TABS
5.0000 mg | ORAL_TABLET | Freq: Every day | ORAL | Status: DC
  Administered 2020-05-27 – 2020-05-28 (×2): 5 mg via ORAL
  Filled 2020-05-27 (×2): qty 1

## 2020-05-27 MED ORDER — INSULIN ASPART 100 UNIT/ML ~~LOC~~ SOLN
0.0000 [IU] | Freq: Three times a day (TID) | SUBCUTANEOUS | Status: DC
  Administered 2020-05-28: 5 [IU] via SUBCUTANEOUS
  Administered 2020-05-28: 2 [IU] via SUBCUTANEOUS

## 2020-05-27 MED ORDER — ALBUTEROL SULFATE (2.5 MG/3ML) 0.083% IN NEBU: 3.0000 mL | INHALATION_SOLUTION | Freq: Four times a day (QID) | RESPIRATORY_TRACT | Status: DC | PRN

## 2020-05-27 MED ORDER — FENTANYL CITRATE (PF) 100 MCG/2ML IJ SOLN
25.0000 ug | INTRAMUSCULAR | Status: DC | PRN
  Administered 2020-05-27: 25 ug via INTRAVENOUS
  Administered 2020-05-27: 50 ug via INTRAVENOUS

## 2020-05-27 MED ORDER — SUGAMMADEX SODIUM 200 MG/2ML IV SOLN
INTRAVENOUS | Status: DC | PRN
  Administered 2020-05-27: 100 mg via INTRAVENOUS
  Administered 2020-05-27 (×4): 50 mg via INTRAVENOUS

## 2020-05-27 MED ORDER — OXYCODONE HCL 5 MG PO TABS
5.0000 mg | ORAL_TABLET | ORAL | Status: DC | PRN
  Administered 2020-05-27 – 2020-05-28 (×4): 5 mg via ORAL
  Filled 2020-05-27 (×4): qty 1

## 2020-05-27 MED ORDER — ONDANSETRON HCL 4 MG/2ML IJ SOLN: 4.0000 mg | Freq: Once | INTRAMUSCULAR | Status: DC | PRN

## 2020-05-27 MED ORDER — CHLORHEXIDINE GLUCONATE 0.12 % MT SOLN: 15.0000 mL | Freq: Once | OROMUCOSAL | Status: AC

## 2020-05-27 MED ORDER — CEFAZOLIN SODIUM-DEXTROSE 2-4 GM/100ML-% IV SOLN
INTRAVENOUS | Status: AC
  Filled 2020-05-27: qty 100

## 2020-05-27 MED ORDER — PHENYLEPHRINE HCL-NACL 10-0.9 MG/250ML-% IV SOLN
INTRAVENOUS | Status: DC | PRN
  Administered 2020-05-27: 25 ug/min via INTRAVENOUS

## 2020-05-27 MED ORDER — GABAPENTIN 100 MG PO CAPS: 100.0000 mg | ORAL_CAPSULE | Freq: Two times a day (BID) | ORAL | Status: DC | PRN

## 2020-05-27 MED ORDER — ORAL CARE MOUTH RINSE: 15.0000 mL | Freq: Once | OROMUCOSAL | Status: AC

## 2020-05-27 MED ORDER — ROCURONIUM BROMIDE 10 MG/ML (PF) SYRINGE
PREFILLED_SYRINGE | INTRAVENOUS | Status: DC | PRN
  Administered 2020-05-27: 60 mg via INTRAVENOUS

## 2020-05-27 MED ORDER — OXYCODONE HCL 5 MG/5ML PO SOLN: 5.0000 mg | Freq: Once | ORAL | Status: AC | PRN

## 2020-05-27 MED ORDER — SODIUM CHLORIDE 0.9 % IV SOLN: INTRAVENOUS | Status: DC

## 2020-05-27 MED ORDER — OXYCODONE HCL 5 MG PO TABS
5.0000 mg | ORAL_TABLET | Freq: Once | ORAL | Status: AC | PRN
  Administered 2020-05-27: 5 mg via ORAL

## 2020-05-27 MED ORDER — BUPIVACAINE HCL (PF) 0.25 % IJ SOLN
INTRAMUSCULAR | Status: AC
  Filled 2020-05-27: qty 30

## 2020-05-27 MED ORDER — PANTOPRAZOLE SODIUM 40 MG PO TBEC
40.0000 mg | DELAYED_RELEASE_TABLET | Freq: Every day | ORAL | Status: DC
  Administered 2020-05-27 – 2020-05-28 (×2): 40 mg via ORAL
  Filled 2020-05-27 (×2): qty 1

## 2020-05-27 MED ORDER — FENTANYL CITRATE (PF) 250 MCG/5ML IJ SOLN
INTRAMUSCULAR | Status: DC | PRN
  Administered 2020-05-27 (×3): 50 ug via INTRAVENOUS
  Administered 2020-05-27: 100 ug via INTRAVENOUS
  Administered 2020-05-27: 50 ug via INTRAVENOUS

## 2020-05-27 MED ORDER — EPHEDRINE SULFATE-NACL 50-0.9 MG/10ML-% IV SOSY: PREFILLED_SYRINGE | INTRAVENOUS | Status: DC | PRN

## 2020-05-27 MED ORDER — ONDANSETRON HCL 4 MG/2ML IJ SOLN
INTRAMUSCULAR | Status: DC | PRN
  Administered 2020-05-27: 2 mg via INTRAVENOUS

## 2020-05-27 MED ORDER — OXYCODONE HCL 5 MG PO TABS
ORAL_TABLET | ORAL | Status: AC
  Filled 2020-05-27: qty 1

## 2020-05-27 MED ORDER — ACETAMINOPHEN 650 MG RE SUPP: 650.0000 mg | RECTAL | Status: DC | PRN

## 2020-05-27 MED ORDER — METHOCARBAMOL 500 MG PO TABS
500.0000 mg | ORAL_TABLET | Freq: Four times a day (QID) | ORAL | Status: DC | PRN
  Administered 2020-05-27 – 2020-05-28 (×3): 500 mg via ORAL
  Filled 2020-05-27 (×2): qty 1

## 2020-05-27 SURGICAL SUPPLY — 56 items
AGENT HMST KT MTR STRL THRMB (HEMOSTASIS) ×1
BUR ROUND FLUTED 4 SOFT TCH (BURR) ×1 IMPLANT
CANISTER SUCT 3000ML PPV (MISCELLANEOUS) ×2 IMPLANT
CLSR STERI-STRIP ANTIMIC 1/2X4 (GAUZE/BANDAGES/DRESSINGS) ×2 IMPLANT
COVER SURGICAL LIGHT HANDLE (MISCELLANEOUS) ×2 IMPLANT
COVER WAND RF STERILE (DRAPES) ×2 IMPLANT
DECANTER SPIKE VIAL GLASS SM (MISCELLANEOUS) ×2 IMPLANT
DRAPE HALF SHEET 40X57 (DRAPES) ×4 IMPLANT
DRAPE MICROSCOPE LEICA (MISCELLANEOUS) ×2 IMPLANT
DRAPE SURG 17X23 STRL (DRAPES) ×2 IMPLANT
DRSG MEPILEX BORDER 4X4 (GAUZE/BANDAGES/DRESSINGS) ×2 IMPLANT
DRSG PAD ABDOMINAL 8X10 ST (GAUZE/BANDAGES/DRESSINGS) ×1 IMPLANT
DURAPREP 26ML APPLICATOR (WOUND CARE) ×2 IMPLANT
ELECT BLADE 4.0 EZ CLEAN MEGAD (MISCELLANEOUS) ×2
ELECT BLADE 6.5 EXT (BLADE) ×1 IMPLANT
ELECT REM PT RETURN 9FT ADLT (ELECTROSURGICAL) ×2
ELECTRODE BLDE 4.0 EZ CLN MEGD (MISCELLANEOUS) IMPLANT
ELECTRODE REM PT RTRN 9FT ADLT (ELECTROSURGICAL) ×1 IMPLANT
GAUZE SPONGE 4X4 12PLY STRL (GAUZE/BANDAGES/DRESSINGS) ×2 IMPLANT
GAUZE XEROFORM 1X8 LF (GAUZE/BANDAGES/DRESSINGS) ×2 IMPLANT
GLOVE BIOGEL PI IND STRL 8 (GLOVE) ×2 IMPLANT
GLOVE BIOGEL PI INDICATOR 8 (GLOVE) ×2
GLOVE ORTHO TXT STRL SZ7.5 (GLOVE) ×4 IMPLANT
GOWN STRL REUS W/ TWL LRG LVL3 (GOWN DISPOSABLE) ×2 IMPLANT
GOWN STRL REUS W/ TWL XL LVL3 (GOWN DISPOSABLE) ×1 IMPLANT
GOWN STRL REUS W/TWL 2XL LVL3 (GOWN DISPOSABLE) ×2 IMPLANT
GOWN STRL REUS W/TWL LRG LVL3 (GOWN DISPOSABLE) ×4
GOWN STRL REUS W/TWL XL LVL3 (GOWN DISPOSABLE) ×2
KIT BASIN OR (CUSTOM PROCEDURE TRAY) ×2 IMPLANT
KIT TURNOVER KIT B (KITS) ×2 IMPLANT
MANIFOLD NEPTUNE II (INSTRUMENTS) ×2 IMPLANT
NDL SPNL 18GX3.5 QUINCKE PK (NEEDLE) ×1 IMPLANT
NEEDLE HYPO 25GX1X1/2 BEV (NEEDLE) ×2 IMPLANT
NEEDLE SPNL 18GX3.5 QUINCKE PK (NEEDLE) ×2 IMPLANT
NS IRRIG 1000ML POUR BTL (IV SOLUTION) ×2 IMPLANT
PACK LAMINECTOMY ORTHO (CUSTOM PROCEDURE TRAY) ×2 IMPLANT
PAD ARMBOARD 7.5X6 YLW CONV (MISCELLANEOUS) ×4 IMPLANT
PATTIES SURGICAL .5 X.5 (GAUZE/BANDAGES/DRESSINGS) ×2 IMPLANT
PATTIES SURGICAL .5 X1 (DISPOSABLE) ×1 IMPLANT
PATTIES SURGICAL .75X.75 (GAUZE/BANDAGES/DRESSINGS) IMPLANT
STAPLER VISISTAT 35W (STAPLE) ×1 IMPLANT
SURGIFLO W/THROMBIN 8M KIT (HEMOSTASIS) ×1 IMPLANT
SUT VIC AB 0 CT1 27 (SUTURE)
SUT VIC AB 0 CT1 27XBRD ANBCTR (SUTURE) IMPLANT
SUT VIC AB 1 CT1 27 (SUTURE) ×2
SUT VIC AB 1 CT1 27XBRD ANBCTR (SUTURE) ×1 IMPLANT
SUT VIC AB 1 CTX 27 (SUTURE) ×2 IMPLANT
SUT VIC AB 1 CTX 36 (SUTURE) ×2
SUT VIC AB 1 CTX36XBRD ANBCTR (SUTURE) ×1 IMPLANT
SUT VIC AB 2-0 CT1 27 (SUTURE) ×2
SUT VIC AB 2-0 CT1 TAPERPNT 27 (SUTURE) ×1 IMPLANT
SUT VIC AB 3-0 X1 27 (SUTURE) ×2 IMPLANT
TOWEL GREEN STERILE (TOWEL DISPOSABLE) ×2 IMPLANT
TOWEL GREEN STERILE FF (TOWEL DISPOSABLE) ×2 IMPLANT
TRAY FOL W/BAG SLVR 16FR STRL (SET/KITS/TRAYS/PACK) ×1 IMPLANT
TRAY FOLEY W/BAG SLVR 16FR LF (SET/KITS/TRAYS/PACK) ×2

## 2020-05-27 NOTE — Interval H&P Note (Signed)
History and Physical Interval Note:  05/27/2020 2:26 PM  [name]  has presented today for surgery, with the diagnosis of L2-3, L3-4 lumbar stenosis.  The various methods of treatment have been discussed with the patient and family. After consideration of risks, benefits and other options for treatment, the patient has consented to  Procedure(s): L2-3, L3-4  decompression (N/A) as a surgical intervention.  The patient's history has been reviewed, patient examined, no change in status, stable for surgery.  I have reviewed the patient's chart and labs.  Questions were answered to the patient's satisfaction.     [name]

## 2020-05-27 NOTE — Anesthesia Procedure Notes (Signed)
Procedure Name: Intubation Date/Time: 05/27/2020 3:31 PM Performed by: Rande Brunt, CRNA Pre-anesthesia Checklist: Patient identified, Emergency Drugs available, Suction available and Patient being monitored Patient Re-evaluated:Patient Re-evaluated prior to induction Oxygen Delivery Method: Circle System Utilized Preoxygenation: Pre-oxygenation with 100% oxygen Induction Type: IV induction Ventilation: Mask ventilation without difficulty Laryngoscope Size: Miller and 2 (attempted with MAC 4 and grade 3b view; ) Grade View: Grade III Tube type: Oral Tube size: 7.0 mm Number of attempts: 1 Airway Equipment and Method: Stylet and Oral airway Placement Confirmation: ETT inserted through vocal cords under direct vision,  positive ETCO2 and breath sounds checked- equal and bilateral Secured at: 22 cm Tube secured with: Tape Dental Injury: Teeth and Oropharynx as per pre-operative assessment  Difficulty Due To: Difficulty was unanticipated Comments: Attempted with MAC 4 x 1 with grade 3b view and then with MAC 4 and bougie; Dr. Linna Caprice successful intubation with grade 3b view Miller 2 and bougie; Difficulty due to large tongue, weave and large prominent teeth

## 2020-05-27 NOTE — Transfer of Care (Signed)
Immediate Anesthesia Transfer of Care Note  Patient: [name]  Procedure(s) Performed: L2-3, L3-4  decompression (N/A Spine Lumbar)  Patient Location: PACU  Anesthesia Type:General  Level of Consciousness: awake, alert  and drowsy  Airway & Oxygen Therapy: Patient Spontanous Breathing and Patient connected to face mask oxygen  Post-op Assessment: Report given to RN, Post -op Vital signs reviewed and stable and Patient moving all extremities  Post vital signs: Reviewed and stable  Last Vitals:  Vitals Value Taken Time  BP 191/90   Temp    Pulse 60   Resp 14   SpO2 98     Last Pain:  Vitals:   05/27/20 1345  TempSrc:   PainSc: 0-No pain      Patients Stated Pain Goal: 2 (94/99/71 8209)  Complications: No complications documented.

## 2020-05-27 NOTE — Op Note (Signed)
Preop diagnosis: L2-3, L3-4 severe spinal stenosis with neurogenic claudication.  Previous L2-3 microdiscectomy.  Postop diagnosis: Same  Procedure: Reexploration decompression L2-3.  Central decompression L3-4 with central and lateral recess decompression for spinal stenosis.  Surgeon: Rodell Perna, MD  Assistant: Benjiman Core, PA-C medically necessary and present for the entire procedure  EBL: 076 cc  Complications: None.  Procedure: After induction of general seizure orotracheal ovation patient was placed prone on press chest rolls careful padding positioning yellow pads underneath the shoulders forearm and ulnar nerve.  Leg pumpers were used as well as compression stockings for DVT prophylaxis.  Patient was secured extra with tape due to body habitus and obesity.  Extra straps were applied above and below to keep her stabilized on the chest rolls.  Back was prepped with DuraPrep after sterile skin marker was then used squared with towels Betadine Steri-Drape applied and 2 spinal needles were placed based on palpable landmarks and using the old incision scars from her previous surgery.  Crosstable lateral x-ray was taken and needles were adjusted centimeter and then repeat x-ray taken for the planned areas for decompression just above the L2-3 disc and below the L3-4 level.  Patient area compression stopped immediately at the level of the pedicle at 4.  After timeout procedure Ancef prophylaxis midline incision was made and subperiosteal dissection cerebellar retractors placed.  The Risco with double blades were used since the possible column was not available.  Kocher clamps were placed crosstable lateral x-ray was taken and then bone was marked for the level for planned decompression of the cephalad and caudad.  Spinous processes were removed lamina was thinned with a rondure osteotome Kerrison rongeur was used.  Bone wax used intermittently 4 mm bur was used to thin the lamina and then  removal of the remaining posterior element lamina with 3 and 4 mm Kerrison.  Thick chunks of ligament were removed there was hourglass severe deformity of the dura at both levels at the level of the disc and after chunks of ligament were removed there was overhanging spurs off the facet that had to be removed with care taken to make sure all small spikes were removed to not puncture the dura.  Some epidural veins were coagulated with the bipolar.  Some Surgi-Flo was used as well as small patty's half by half and 1 by half patties.  Continue decompression until there was complete decompression epidural fat around the dura no areas of compression above or below both gutters were checked and some remaining pieces of ligamentum was removed.  There was less than expected adherence of scar tissue at the previous operative site on the left at L2-3.  Dural tube was intact.  Gutters were followed 1 last time right and left with hockey stick to make sure there is no remaining areas compression repeat irrigation epidural area was relatively dry.  Fascia was reapproximated with #1 Vicryl interrupted 2-0 Vicryl subtendinous tissue skin staple closure postop dressing and transferred to care room in stable condition.

## 2020-05-28 ENCOUNTER — Encounter (HOSPITAL_COMMUNITY): Payer: Self-pay | Admitting: Orthopaedic Surgery

## 2020-05-28 DIAGNOSIS — M5416 Radiculopathy, lumbar region: Secondary | ICD-10-CM | POA: Diagnosis not present

## 2020-05-28 DIAGNOSIS — E119 Type 2 diabetes mellitus without complications: Secondary | ICD-10-CM | POA: Diagnosis not present

## 2020-05-28 DIAGNOSIS — Z951 Presence of aortocoronary bypass graft: Secondary | ICD-10-CM | POA: Diagnosis not present

## 2020-05-28 DIAGNOSIS — I10 Essential (primary) hypertension: Secondary | ICD-10-CM | POA: Diagnosis not present

## 2020-05-28 DIAGNOSIS — I2510 Atherosclerotic heart disease of native coronary artery without angina pectoris: Secondary | ICD-10-CM | POA: Diagnosis not present

## 2020-05-28 DIAGNOSIS — Z853 Personal history of malignant neoplasm of breast: Secondary | ICD-10-CM | POA: Diagnosis not present

## 2020-05-28 DIAGNOSIS — Z79899 Other long term (current) drug therapy: Secondary | ICD-10-CM | POA: Diagnosis not present

## 2020-05-28 DIAGNOSIS — Z87891 Personal history of nicotine dependence: Secondary | ICD-10-CM | POA: Diagnosis not present

## 2020-05-28 LAB — HEMOGLOBIN A1C
Hgb A1c MFr Bld: 7.9 % — ABNORMAL HIGH (ref 4.8–5.6)
Mean Plasma Glucose: 180.03 mg/dL

## 2020-05-28 LAB — GLUCOSE, CAPILLARY
Glucose-Capillary: 212 mg/dL — ABNORMAL HIGH (ref 70–99)
Glucose-Capillary: 140 mg/dL — ABNORMAL HIGH (ref 70–99)

## 2020-05-28 NOTE — Progress Notes (Signed)
   Subjective: 1 Day Post-Op Procedure(s) (LRB): L2-3, L3-4  decompression (N/A) Patient reports pain as mild and moderate.    Objective: Vital signs in last 24 hours: Temp:  [97.3 F (36.3 C)-98.4 F (36.9 C)] 98.3 F (36.8 C) (12/02 0749) Pulse Rate:  [57-75] 75 (12/02 0749) Resp:  [13-20] 16 (12/02 0749) BP: (124-191)/(54-82) 124/62 (12/02 0749) SpO2:  [95 %-100 %] 98 % (12/02 0749) Weight:  [108.2 kg] 108.2 kg (12/01 1338)  Intake/Output from previous day: 12/01 0701 - 12/02 0700 In: 1100 [I.V.:1000; IV Piggyback:100] Out: 975 [Urine:725; Blood:250] Intake/Output this shift: No intake/output data recorded.  No results for input(s): HGB in the last 72 hours. No results for input(s): WBC, RBC, HCT, PLT in the last 72 hours. No results for input(s): NA, K, CL, CO2, BUN, CREATININE, GLUCOSE, CALCIUM in the last 72 hours. No results for input(s): LABPT, INR in the last 72 hours.  Neurologically intact DG Lumbar Spine 2-3 Views  Result Date: 05/27/2020 CLINICAL DATA:  L2-L4 decompression EXAM: LUMBAR SPINE - 2-3 VIEW COMPARISON:  02/04/2020 FINDINGS: 3 intraoperative lateral views of the lumbar spine are obtained. On the first image, surgical instrumentation is seen posterior to the L1/L2 disc space and L4 vertebral body. On the second image, the superior surgical instrument is posterior to the L2 vertebral body in the inferior instrument posterior to the L4 vertebral body. On the final image, surgical instrumentation is seen posterior to the L2 and L4 vertebral bodies. IMPRESSION: 1. Intraoperative examination is as above. Electronically Signed   By: Randa Ngo M.D.   On: 05/27/2020 19:01    Assessment/Plan: 1 Day Post-Op Procedure(s) (LRB): L2-3, L3-4  decompression (N/A) Up with therapy, home later today after therapy.  3 in 1 for home.   [name] 05/28/2020, 8:05 AM

## 2020-05-28 NOTE — Progress Notes (Signed)
PT note reviewed, HHPT placed order. thanks

## 2020-05-28 NOTE — TOC Transition Note (Signed)
Transition of Care [facility]) - CM/SW Discharge Note   Patient Details  Name: Deatrice Spanbauer MRN: [id number] Date of Birth: [birth year]  Transition of Care [facility] CM/SW Contact:  Benard Halsted, LCSW Phone Number: 05/28/2020, 3:02 PM   Clinical Narrative:    CSW received consult for possible home health services at time of discharge and patient is accepting of those services. CSW sent referral for review with [facility] as they are in network; [place] accepted referral. CSW provided Medicare [facility] ratings list. CSW confirmed PCP and address.    Final next level of care: Home w Home Health Services Barriers to Discharge: No Barriers Identified   Patient Goals and CMS Choice   CMS Medicare.gov Compare Post Acute Care list provided to:: Patient Choice offered to / list presented to : Patient  Discharge Placement                       Discharge Plan and Services   Discharge Planning Services: CM Consult Post Acute Care Choice: Home Health                    HH Arranged: PT, OT [facility] Agency: [place] Date [place]: 05/28/20 Time [place]: [id number] Representative spoke with at [place]: [place] Determinants of Health ([place]) Interventions     Readmission Risk Interventions No flowsheet data found.

## 2020-05-28 NOTE — Evaluation (Addendum)
Physical Therapy Evaluation Patient Details Name: [name] MRN: [id number] DOB: 12-11-1950 Today's Date: 05/28/2020   History of Present Illness  69 y.o. female presenting with L2-3, L3-4 severe spinal stenosis with neurogenic claudication s/p decompression on 12/1 by Dr. [name]. PMHx significant for CVA, HTN, depression, DM, L2-5 abscess in epidural space, arthritis, hx breast cancer, CAD s/p CABG, and L RTC repair 2020.     Clinical Impression  Pt admitted with above diagnosis. At the time of PT eval, pt was able to demonstrate transfers and ambulation with gross min guard assist to min assist and RW for support. Pt was educated on precautions, appropriate activity progression, and car transfer. Pt currently with functional limitations due to the deficits listed below (see PT Problem List). Pt will benefit from skilled PT to increase their independence and safety with mobility to allow discharge to the venue listed below.      Follow Up Recommendations Home Health PT; Supervision for mobility/OOB    Equipment Recommendations  Rolling walker with 5" wheels;3in1 (PT)    Recommendations for Other Services       Precautions / Restrictions Precautions Precautions: Fall;Back Precaution Booklet Issued: Yes (comment) Precaution Comments: Patient able to recall 3/3 back precautions Required Braces or Orthoses:  (None) Restrictions Weight Bearing Restrictions: No      Mobility  Bed Mobility Overal bed mobility: Needs Assistance Bed Mobility: Rolling;Sidelying to Sit;Sit to Sidelying Rolling: Min guard Sidelying to sit: Min assist     Sit to sidelying: Min assist General bed mobility comments: Assist for Trunk elevation to full sitting position as well as for LE elevation back up into the bed at end of session. VC's throughout for log roll technique.     Transfers Overall transfer level: Needs assistance Equipment used: Rolling walker (2 wheeled) Transfers: Sit  to/from [facility] Sit to Stand: From elevated surface;Min guard         General transfer comment: From elevated EOB. Pt required increased time and effort to power-up to full stand. VC's for wide BOS and hand placement on seated surface for safety for stand>sit.   Ambulation/Gait Ambulation/Gait assistance: Min guard Gait Distance (Feet): 200 Feet Assistive device: Rolling walker (2 wheeled) Gait Pattern/deviations: Step-through pattern;Decreased stride length;Trunk flexed Gait velocity: Decreased Gait velocity interpretation: <1.31 ft/sec, indicative of household ambulator General Gait Details: VC's for improved posture, scapular depression, and forward gaze. Pt took 2 standing rest breaks due to fatigue and pain but overall appeared steady with the RW.   Stairs            Wheelchair Mobility    Modified Rankin (Stroke Patients Only)       Balance Overall balance assessment: Mild deficits observed, not formally tested                                           Pertinent Vitals/Pain Pain Assessment: Faces Faces Pain Scale: Hurts even more Pain Location: Low back at incision Pain Descriptors / Indicators: Grimacing;Operative site guarding;Moaning Pain Intervention(s): Limited activity within patient's tolerance;Monitored during session;Repositioned;Premedicated before session    Home Living Family/patient expects to be discharged to:: Private residence Living Arrangements: Other relatives (Brother and sister-in-law) Available Help at Discharge: Family;Available 24 hours/day Type of Home: House Home Access: Level entry     Home Layout: One level Home Equipment:  (Hurri-cane) Additional Comments: Plans to stay with  brother and sister-in-law for a short time and then back to her 2 story home.     Prior Function Level of Independence: Needs assistance   Gait / Transfers Assistance Needed: Use of hurri-cane in home. Patient only  able to ambulate short distances 2/2 pain. Supervision A for tub/shower transfers.   ADL's / Homemaking Assistance Needed: Mod I for ADLs. Patient reports assist for IADLs including laundry, meal prep, and grocery shopping        Hand Dominance   Dominant Hand: Right    Extremity/Trunk Assessment   Upper Extremity Assessment Upper Extremity Assessment: Defer to OT evaluation    Lower Extremity Assessment Lower Extremity Assessment: Generalized weakness (Consistent with pre-op diagnosis)    Cervical / Trunk Assessment Cervical / Trunk Assessment: Other exceptions Cervical / Trunk Exceptions: s/p surgery  Communication   Communication: No difficulties  Cognition Arousal/Alertness: Awake/alert Behavior During Therapy: WFL for tasks assessed/performed Overall Cognitive Status: Within Functional Limits for tasks assessed                                        General Comments      Exercises     Assessment/Plan    PT Assessment Patient needs continued PT services  PT Problem List Decreased strength;Decreased activity tolerance;Decreased balance;Decreased mobility;Decreased knowledge of use of DME;Decreased safety awareness;Decreased knowledge of precautions;Pain       PT Treatment Interventions DME instruction;Gait training;Therapeutic activities;Functional mobility training;Therapeutic exercise;Neuromuscular re-education;Patient/family education    PT Goals (Current goals can be found in the Care Plan section)  Acute Rehab PT Goals Patient Stated Goal: To decrease pain PT Goal Formulation: With patient Time For Goal Achievement: 06/04/20 Potential to Achieve Goals: Good    Frequency Min 5X/week   Barriers to discharge        Co-evaluation               AM-PAC PT "6 Clicks" Mobility  Outcome Measure Help needed turning from your back to your side while in a flat bed without using bedrails?: A Little Help needed moving from lying on your  back to sitting on the side of a flat bed without using bedrails?: A Little Help needed moving to and from a bed to a chair (including a wheelchair)?: A Little Help needed standing up from a chair using your arms (e.g., wheelchair or bedside chair)?: A Little Help needed to walk in hospital room?: A Little Help needed climbing 3-5 steps with a railing? : A Little 6 Click Score: 18    End of Session Equipment Utilized During Treatment: Gait belt Activity Tolerance: Patient tolerated treatment well Patient left: in bed;with call bell/phone within reach Nurse Communication: Mobility status;Other (comment) (Requesting anti-nausea medication, throat lozenges) PT Visit Diagnosis: Unsteadiness on feet (R26.81);Pain Pain - part of body:  (back)    Time: 6295-2841 PT Time Calculation (min) (ACUTE ONLY): 33 min   Charges:   PT Evaluation $PT Eval Low Complexity: 1 Low PT Treatments $Gait Training: 8-22 mins        Rolinda Roan, PT, DPT Acute Rehabilitation Services Pager: [phone] Office: [phone]   Thelma Comp 05/28/2020, 2:20 PM

## 2020-05-28 NOTE — Evaluation (Signed)
Occupational Therapy Evaluation Patient Details Name: [name] MRN: [id number] DOB: Oct 27, 1950 Today's Date: 05/28/2020    History of Present Illness 69 y.o. female presenting with L2-3, L3-4 severe spinal stenosis with neurogenic claudication s/p decompression on 12/1 by Dr. [name]. PMHx significant for CVA, HTN, depression, DM, L2-5 abscess in epidural space, arthritis, hx breast cancer, CAD s/p CABG, and L RTC repair 2020.    Clinical Impression   PTA patient was living with her spouse, daughter and grandchildren in a split level home and was Mod I with ADLs and was ambulating short distances in home with use of hurry cane. Assist required for IADLs including laundry, meal prep, and grocery shopping 2/2 progressive back pain. Patient currently presents below baseline level of function demonstrating deficits including decreased strength, mild balance deficits, and increased need for external assist with bed mobility, sit to stand transfers, and LB bathing/dressing. Patient also greatly limited by pain. Education provided on back precautions, use of AE for pain management and for increased adherence to back precautions, and home set-up for safety. Patient expressed verbal understanding. Patient would benefit from continued acute OT services to maximize safety and independence with self-care tasks in prep for safe d/c with plan to go to her brothers home. Brother has a single-level private residence with level entry.     Follow Up Recommendations  Home health OT;Supervision/Assistance - 24 hour    Equipment Recommendations  3 in 1 bedside commode;Tub/shower bench    Recommendations for Other Services       Precautions / Restrictions Precautions Precautions: Fall;Back Precaution Booklet Issued: Yes (comment) Precaution Comments: Patient able to recall 3/3 back precautions Required Braces or Orthoses:  (None) Restrictions Weight Bearing Restrictions: No      Mobility Bed  Mobility Overal bed mobility: Needs Assistance Bed Mobility: Rolling;Sidelying to Sit;Sit to Supine Rolling: Min guard Sidelying to sit: Min assist       General bed mobility comments: Min guard for rolling R with cues for log rolling technique and Min A for supine <> EOB at trunk and BLE 2/2 pain.     Transfers Overall transfer level: Needs assistance Equipment used: Rolling walker (2 wheeled) Transfers: Sit to/from [facility] Sit to Stand: Min assist Stand pivot transfers: Min guard       General transfer comment: Heavy Min A for sit to stand from elevated EOB with increased time/effort and cues for hand placement and technique. Min guard for stand-pivot transfer with use of RW.     Balance Overall balance assessment: Mild deficits observed, not formally tested                                         ADL either performed or assessed with clinical judgement   ADL Overall ADL's : Needs assistance/impaired     Grooming: Wash/dry hands;Wash/dry face;Supervision/safety;Standing Grooming Details (indicate cue type and reason): Patient completed 2/3 grooming tasks standing at sink. Education provided on compensatory technique to increase adherence to back precautions.              Lower Body Dressing: Minimal assistance;Sitting/lateral leans Lower Body Dressing Details (indicate cue type and reason): Min A with use of AE. Patient limited by pain.    Toilet Transfer Details (indicate cue type and reason): Unable. Patient requires use of BSC. No BSC in room. RN made aware.  Functional mobility during ADLs: Min guard;Rolling walker General ADL Comments: Min guard for safety with use of RW with education on walker management. Patient able to return demonstrate with good accuracy.      Vision   Vision Assessment?: No apparent visual deficits     Perception     Praxis      Pertinent Vitals/Pain Pain Assessment: 0-10 Pain  Score: 6  Pain Location: Low back at incision Pain Descriptors / Indicators: Constant;Grimacing;Guarding Pain Intervention(s): Limited activity within patient's tolerance;Monitored during session;Patient requesting pain meds-RN notified;Relaxation;Repositioned     Hand Dominance Right   Extremity/Trunk Assessment Upper Extremity Assessment Upper Extremity Assessment: Overall WFL for tasks assessed   Lower Extremity Assessment Lower Extremity Assessment: Defer to PT evaluation   Cervical / Trunk Assessment Cervical / Trunk Assessment: Normal   Communication Communication Communication: No difficulties   Cognition Arousal/Alertness: Awake/alert Behavior During Therapy: WFL for tasks assessed/performed Overall Cognitive Status: Within Functional Limits for tasks assessed                                     General Comments       Exercises     Shoulder Instructions      Home Living Family/patient expects to be discharged to:: Private residence Living Arrangements: Other relatives (Brother and sister-in-law) Available Help at Discharge: Family;Available 24 hours/day Type of Home: House Home Access: Level entry     Home Layout: One level     Bathroom Shower/Tub: Teacher, early years/pre: Standard Bathroom Accessibility: Yes How Accessible: Accessible via walker Home Equipment:  (Hurry cane)          Prior Functioning/Environment Level of Independence: Needs assistance  Gait / Transfers Assistance Needed: Use of hurry cane in home. Patient only able to ambulate short distances 2/2 pain. Supervision A for tub/shower transfers.  ADL's / Homemaking Assistance Needed: Mod I for ADLs. Patient reports assist for IADLs including laundry, meal prep, and grocery shopping            OT Problem List: Decreased strength;Impaired balance (sitting and/or standing);Pain;Decreased knowledge of use of DME or AE      OT Treatment/Interventions:  Self-care/ADL training;Therapeutic exercise;Energy conservation;DME and/or AE instruction;Therapeutic activities;Patient/family education;Balance training    OT Goals(Current goals can be found in the care plan section) Acute Rehab OT Goals Patient Stated Goal: To decrease pain OT Goal Formulation: With patient Time For Goal Achievement: 06/11/20 Potential to Achieve Goals: Good ADL Goals Pt Will Perform Grooming: with modified independence;standing Pt Will Perform Lower Body Dressing: with modified independence;sit to/from stand;with adaptive equipment Pt Will Transfer to Toilet: with modified independence;ambulating;bedside commode Pt Will Perform Toileting - Clothing Manipulation and hygiene: with modified independence;sit to/from stand Additional ADL Goal #1: Patient will recall 3/3 back precautions without cueing in prep for ADLs.  OT Frequency: Min 2X/week   Barriers to D/C:            Co-evaluation              AM-PAC OT "6 Clicks" Daily Activity     Outcome Measure Help from another person eating meals?: None Help from another person taking care of personal grooming?: A Little Help from another person toileting, which includes using toliet, bedpan, or urinal?: A Little Help from another person bathing (including washing, rinsing, drying)?: A Lot Help from another person to put on and taking off regular upper  body clothing?: None Help from another person to put on and taking off regular lower body clothing?: A Little 6 Click Score: 19   End of Session Equipment Utilized During Treatment: Gait belt;Rolling walker Nurse Communication: Mobility status;Patient requests pain meds  Activity Tolerance: Patient limited by fatigue Patient left: in bed;with call bell/phone within reach  OT Visit Diagnosis: Unsteadiness on feet (R26.81);Pain Pain - part of body:  (Back at incision)                Time: 0900-0930 OT Time Calculation (min): 30 min Charges:  OT General  Charges $OT Visit: 1 Visit OT Evaluation $OT Eval Moderate Complexity: 1 Mod OT Treatments $Self Care/Home Management : 8-22 mins  [name] Supplemental OT, Department of rehab services [phone]  Stacy Groner R H. 05/28/2020, 9:54 AM

## 2020-05-28 NOTE — Progress Notes (Signed)
Patient is discharged from room 3C03 at this time. Alert and in stable condition. IV site d/c'd and instructions read to patient and son with understanding verbalized and all questions answered. Left unit via wheelchair with all belongings at side.

## 2020-05-29 ENCOUNTER — Telehealth: Payer: Self-pay

## 2020-05-29 ENCOUNTER — Telehealth: Payer: Self-pay | Admitting: Orthopaedic Surgery

## 2020-05-29 DIAGNOSIS — M479 Spondylosis, unspecified: Secondary | ICD-10-CM | POA: Diagnosis not present

## 2020-05-29 DIAGNOSIS — M1990 Unspecified osteoarthritis, unspecified site: Secondary | ICD-10-CM | POA: Diagnosis not present

## 2020-05-29 DIAGNOSIS — Z4789 Encounter for other orthopedic aftercare: Secondary | ICD-10-CM | POA: Diagnosis not present

## 2020-05-29 DIAGNOSIS — I10 Essential (primary) hypertension: Secondary | ICD-10-CM | POA: Diagnosis not present

## 2020-05-29 DIAGNOSIS — I2510 Atherosclerotic heart disease of native coronary artery without angina pectoris: Secondary | ICD-10-CM | POA: Diagnosis not present

## 2020-05-29 DIAGNOSIS — E119 Type 2 diabetes mellitus without complications: Secondary | ICD-10-CM | POA: Diagnosis not present

## 2020-05-29 DIAGNOSIS — D509 Iron deficiency anemia, unspecified: Secondary | ICD-10-CM | POA: Diagnosis not present

## 2020-05-29 DIAGNOSIS — E785 Hyperlipidemia, unspecified: Secondary | ICD-10-CM | POA: Diagnosis not present

## 2020-05-29 DIAGNOSIS — M48061 Spinal stenosis, lumbar region without neurogenic claudication: Secondary | ICD-10-CM | POA: Diagnosis not present

## 2020-05-29 NOTE — Telephone Encounter (Signed)
Transition Care Management Follow-up Telephone Call  Date of discharge and from where:[facility] on 05/29/2011  How have you been since you were released from the hospital? Much better  Any questions or concerns? No questions/concerns reported.   Items Reviewed:  Did the pt receive and understand the discharge instructions provided? Stated that have the instructions and have no questions.   Medications obtained and verified? She said that they have the medication list  And was  reviewed prior to discharge. She said that he has all of the medications and have no questions.   Any new allergies since your discharge? None reported   Do you have support at home? Yes, Family  Other (ie: DME, Home Health, etc)  [facility]/ PT       Functional Questionnaire: (I = Independent and D = Dependent)  ADL's:  Independent.      Follow up appointments reviewed:      PCP Hospital f/u appt confirmed? @ 1050 with Dr Cindee Salt on 06/12/2020    Specialist Hospital f/u appt confirmed? None scheduled at this time    Are transportation arrangements needed? have transportation     If their condition worsens, is the pt aware to call  their PCP or go to the ED? Yes.Made pt aware if condition worsen or start experiencing rapid weight gain, chest pain, diff breathing, SOB, high fevers, or bleading to refer imediately to ED for further evaluation.    Was the patient provided with contact information for the PCP's office or ED? He has the phone number    Was the pt encouraged to call back with questions or concerns?yes

## 2020-05-29 NOTE — Telephone Encounter (Signed)
I left message for therapist he did not pick up, called pt and discusse.d

## 2020-05-29 NOTE — Telephone Encounter (Signed)
Please advise 

## 2020-05-29 NOTE — Telephone Encounter (Signed)
[name] from [facility] called with evaluation of patient. [name] states patient pain is 8 out of 10 Blood sugar 290 Patient did not have any pain meds after 2 pm Missing meds of patient merailax Melatonin Baclofen Albuterol inhaler Patient is missing back brace no indication of brace Verbal orders are as follow PT home health  1 wk 1, 2 wk 4, and 1 wk 4 OT ADL's are as follow Home health 2 xwk for 3 wks bathing and dressing. Jim phone number I [phone]. Please leave meaasge on secure VM

## 2020-05-29 NOTE — Anesthesia Postprocedure Evaluation (Signed)
Anesthesia Post Note  Patient: [name]  Procedure(s) Performed: L2-3, L3-4  decompression (N/A Spine Lumbar)     Patient location during evaluation: PACU Anesthesia Type: General Level of consciousness: awake and alert Pain management: pain level controlled Vital Signs Assessment: post-procedure vital signs reviewed and stable Respiratory status: spontaneous breathing, nonlabored ventilation and respiratory function stable Cardiovascular status: blood pressure returned to baseline and stable Postop Assessment: no apparent nausea or vomiting Anesthetic complications: no   No complications documented.  Last Vitals:  Vitals:   05/28/20 0749 05/28/20 1148  BP: 124/62 (!) 144/68  Pulse: 75 67  Resp: 16 16  Temp: 36.8 C 36.9 C  SpO2: 98% 100%    Last Pain:  Vitals:   05/28/20 1148  TempSrc: Oral  PainSc:                  [name]

## 2020-06-02 DIAGNOSIS — E785 Hyperlipidemia, unspecified: Secondary | ICD-10-CM | POA: Diagnosis not present

## 2020-06-02 DIAGNOSIS — I2510 Atherosclerotic heart disease of native coronary artery without angina pectoris: Secondary | ICD-10-CM | POA: Diagnosis not present

## 2020-06-02 DIAGNOSIS — M479 Spondylosis, unspecified: Secondary | ICD-10-CM | POA: Diagnosis not present

## 2020-06-02 DIAGNOSIS — E119 Type 2 diabetes mellitus without complications: Secondary | ICD-10-CM | POA: Diagnosis not present

## 2020-06-02 DIAGNOSIS — I10 Essential (primary) hypertension: Secondary | ICD-10-CM | POA: Diagnosis not present

## 2020-06-02 DIAGNOSIS — Z4789 Encounter for other orthopedic aftercare: Secondary | ICD-10-CM | POA: Diagnosis not present

## 2020-06-02 DIAGNOSIS — M48061 Spinal stenosis, lumbar region without neurogenic claudication: Secondary | ICD-10-CM | POA: Diagnosis not present

## 2020-06-02 DIAGNOSIS — M1990 Unspecified osteoarthritis, unspecified site: Secondary | ICD-10-CM | POA: Diagnosis not present

## 2020-06-02 DIAGNOSIS — D509 Iron deficiency anemia, unspecified: Secondary | ICD-10-CM | POA: Diagnosis not present

## 2020-06-02 NOTE — Discharge Summary (Signed)
Patient ID: Abagail Limb MRN: [id number] DOB/AGE: 71-Dec-1952 71 y.o.  Admit date: 05/27/2020 Discharge date: 05/28/2020 Admission Diagnoses:  Active Problems:   Lumbar stenosis   Discharge Diagnoses:  Active Problems:   Lumbar stenosis  status post Procedure(s): L2-3, L3-4  decompression  Past Medical History:  Diagnosis Date  . Abscess in epidural space of L2-L5 lumbar spine 03/2006  . Anemia, iron deficiency   . Arthritis   . Breast cancer ([place]) 06/12/2017   right breast  . CAD (coronary artery disease), native coronary artery 03/30/2015  . Cataract   . Colon polyp    a. Multiple colonic polyps status post colonoscopy in October 2007, consistent with tubular adenoma, tubulovillous adenoma with no high-grade dysplasia or malignancy identified.   . Constipation   . Coronary artery disease    a. s/p CABG 06/2010: S-D2; S-PDA (at time of MV surgery).  . Depression   . Diabetes mellitus    type II  . Family history of breast cancer   . Family history of prostate cancer   . Genetic testing 09/25/2017   Multi-Cancer panel (83 genes) @ [facility] - No pathogenic mutations detected  . GERD (gastroesophageal reflux disease)   . Hx of transient ischemic attack (TIA)    a. See stroke section.  . Hyperlipidemia   . Hypertension   . Hypoxia    a. Has history of acute hypoxic respiratory failure in the setting of bronchitis/PNA or prior admissions.  [name] MRSA infection    a. History of recurrent skin infection and soft tissue abscesses, with MRSA positive in the past.  . 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 mitral valve - s/p resection and MV repair 06/2010 Dr. [name].  . 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 Personal history of radiation therapy   . Sleep apnea    does not use CPAP  . Stenosis of middle cerebral artery    a. Distal R MCA.  . Stroke [facility])    a. 11/2009: mitral mass diagnosed at this time, also has distal R MCA stenosis, tx with coumadin. b.  Readmitted 05/2010 with TIA symptoms - had not been taking Coumadin. s/p MV surgery 06/2010. Coumadin stopped 2013 after review of chart by Dr. [name] since mass was removed (stroke felt possibly related to this).     Surgeries: Procedure(s): L2-3, L3-4  decompression on 05/27/2020   Consultants:   Discharged Condition: Improved  Hospital Course: Arlynn Stare is an 69 y.o. female who was admitted 05/27/2020 for operative treatment of L2-3 and L3-4 stenosis. Patient failed conservative treatments (please see the history and physical for the specifics) and had severe unremitting pain that affects sleep, daily activities and work/hobbies. After pre-op clearance, the patient was taken to the operating room on 05/27/2020 and underwent  Procedure(s): L2-3, L3-4  decompression.    Patient was given perioperative antibiotics:  Anti-infectives (From admission, onward)   Start     Dose/Rate Route Frequency Ordered Stop   05/28/20 0600  ceFAZolin (ANCEF) IVPB 2g/100 mL premix        2 g 200 mL/hr over 30 Minutes Intravenous On call to O.R. 05/27/20 1321 05/27/20 1537   05/27/20 1319  ceFAZolin (ANCEF) 2-4 GM/100ML-% IVPB       Note to Pharmacy: Laurita Quint   : cabinet override      05/27/20 1319 05/27/20 1537       Patient was given sequential compression devices and early ambulation to prevent DVT.  Patient benefited maximally from hospital stay and there were no complications. At the time of discharge, the patient was urinating/moving their bowels without difficulty, tolerating a regular diet, pain is controlled with oral pain medications and they have been cleared by PT/OT.   Recent vital signs: No data found.   Recent laboratory studies: No results for input(s): WBC, HGB, HCT, PLT, NA, K, CL, CO2, BUN, CREATININE, GLUCOSE, INR, CALCIUM in the last 72 hours.  Invalid input(s): PT, 2   Discharge Medications:   Allergies as of 05/28/2020   No Known Allergies     Medication  List    STOP taking these medications   traMADol 50 MG tablet Commonly known as: ULTRAM     TAKE these medications   Accu-Chek Aviva Plus w/Device Kit Use as directed   Accu-Chek Softclix Lancets lancets Use as instructed   albuterol 108 (90 Base) MCG/ACT inhaler Commonly known as: VENTOLIN HFA Inhale 2 puffs into the lungs every 6 (six) hours as needed for wheezing or shortness of breath.   amLODipine 10 MG tablet Commonly known as: NORVASC Take 1 tablet (10 mg total) by mouth daily.   aspirin EC 81 MG tablet Take 1 tablet (81 mg total) by mouth daily.   atorvastatin 80 MG tablet Commonly known as: LIPITOR Take 1 tablet (80 mg total) by mouth daily.   baclofen 10 MG tablet Commonly known as: LIORESAL Take 0.5-1 tablets (5-10 mg total) by mouth 3 (three) times daily as needed for muscle spasms.   buPROPion 150 MG 24 hr tablet Commonly known as: WELLBUTRIN XL Take 1 tablet (150 mg total) by mouth 2 (two) times daily.   carvedilol 12.5 MG tablet Commonly known as: COREG Take 1 tablet (12.5 mg total) by mouth 2 (two) times daily.   diclofenac Sodium 1 % Gel Commonly known as: VOLTAREN APPLY 2 GRAMS TOPICALLY 4 (FOUR) TIMES DAILY AS NEEDED. What changed:   how much to take  how to take this  when to take this  reasons to take this   ferrous sulfate 325 (65 FE) MG tablet Commonly known as: FeroSul TAKE 1 TABLET (325 MG TOTAL) BY MOUTH DAILY WITH BREAKFAST.   gabapentin 100 MG capsule Commonly known as: NEURONTIN Take one at night for a few days then increase to one twice a day. What changed:   how much to take  how to take this  when to take this  reasons to take this  additional instructions   glipiZIDE 5 MG tablet Commonly known as: GLUCOTROL TAKE 1 TABLET DAILY BEFORE BREAKFAST   glucose blood test strip Commonly known as: Accu-Chek Aviva Plus Use as instructed   hydrochlorothiazide 25 MG tablet Commonly known as: HYDRODIURIL Take 1  tablet (25 mg total) by mouth daily.   letrozole 2.5 MG tablet Commonly known as: FEMARA Take 1 tablet (2.5 mg total) by mouth daily.   linagliptin 5 MG Tabs tablet Commonly known as: TRADJENTA Take 1 tablet (5 mg total) by mouth daily.   losartan 100 MG tablet Commonly known as: COZAAR Take 1 tablet (100 mg total) by mouth daily.   Melatonin 10 MG Tabs Take 1 tablet by mouth at bedtime.   metFORMIN 1000 MG tablet Commonly known as: GLUCOPHAGE TAKE 1 TABLET TWICE DAILY WITH MEALS   methocarbamol 500 MG tablet Commonly known as: Robaxin Take 1 tablet (500 mg total) by mouth 4 (four) times daily.   oxyCODONE-acetaminophen 5-325 MG tablet Commonly known as: PERCOCET/ROXICET Take 1 tablet by  mouth every 4 (four) hours as needed for severe pain.   pantoprazole 40 MG tablet Commonly known as: PROTONIX TAKE 1 TABLET EVERY DAY   polyethylene glycol 17 g packet Commonly known as: MIRALAX / GLYCOLAX Take 17 g by mouth daily as needed for mild constipation or moderate constipation.   traZODone 50 MG tablet Commonly known as: DESYREL TAKE 1 TABLET AT BEDTIME AS NEEDED FOR SLEEP   VITAMIN C PO Take 500 mg by mouth daily.       Diagnostic Studies: DG Chest 2 View  Result Date: 05/20/2020 CLINICAL DATA:  Preop low back surgery EXAM: CHEST - 2 VIEW COMPARISON:  12/10/2017 FINDINGS: Prior CABG. Heart and mediastinal contours are within normal limits. No focal opacities or effusions. No acute bony abnormality. IMPRESSION: No active cardiopulmonary disease. Electronically Signed   By: Rolm Baptise M.D.   On: 05/20/2020 10:48   DG Lumbar Spine 2-3 Views  Result Date: 05/27/2020 CLINICAL DATA:  L2-L4 decompression EXAM: LUMBAR SPINE - 2-3 VIEW COMPARISON:  02/04/2020 FINDINGS: 3 intraoperative lateral views of the lumbar spine are obtained. On the first image, surgical instrumentation is seen posterior to the L1/L2 disc space and L4 vertebral body. On the second image, the superior  surgical instrument is posterior to the L2 vertebral body in the inferior instrument posterior to the L4 vertebral body. On the final image, surgical instrumentation is seen posterior to the L2 and L4 vertebral bodies. IMPRESSION: 1. Intraoperative examination is as above. Electronically Signed   By: Randa Ngo M.D.   On: 05/27/2020 19:01    Discharge Instructions    Incentive spirometry RT   Complete by: As directed        Follow-up Information    Schedule an appointment as soon as possible for a visit with [name], MD.   Specialty: Orthopedic Surgery Why: need return office visit one week postop Contact information: [place] [address] [phone]        Care, [facility] Follow up.   Specialty: Home Health Services Why: [place] arranged.  Contact information: [address] [place] [address] [phone]               Discharge Plan:  discharge to home  Disposition:     Signed: Benjiman Core  06/02/2020, 1:55 PM

## 2020-06-03 ENCOUNTER — Telehealth: Payer: Self-pay | Admitting: Orthopaedic Surgery

## 2020-06-03 DIAGNOSIS — M48061 Spinal stenosis, lumbar region without neurogenic claudication: Secondary | ICD-10-CM | POA: Diagnosis not present

## 2020-06-03 DIAGNOSIS — E119 Type 2 diabetes mellitus without complications: Secondary | ICD-10-CM | POA: Diagnosis not present

## 2020-06-03 DIAGNOSIS — D509 Iron deficiency anemia, unspecified: Secondary | ICD-10-CM | POA: Diagnosis not present

## 2020-06-03 DIAGNOSIS — E785 Hyperlipidemia, unspecified: Secondary | ICD-10-CM | POA: Diagnosis not present

## 2020-06-03 DIAGNOSIS — I10 Essential (primary) hypertension: Secondary | ICD-10-CM | POA: Diagnosis not present

## 2020-06-03 DIAGNOSIS — Z4789 Encounter for other orthopedic aftercare: Secondary | ICD-10-CM | POA: Diagnosis not present

## 2020-06-03 DIAGNOSIS — I2510 Atherosclerotic heart disease of native coronary artery without angina pectoris: Secondary | ICD-10-CM | POA: Diagnosis not present

## 2020-06-03 DIAGNOSIS — M1990 Unspecified osteoarthritis, unspecified site: Secondary | ICD-10-CM | POA: Diagnosis not present

## 2020-06-03 DIAGNOSIS — M479 Spondylosis, unspecified: Secondary | ICD-10-CM | POA: Diagnosis not present

## 2020-06-03 NOTE — Telephone Encounter (Signed)
Pt called stating her bandage was coming off of her back . Does she need to be seen before the 14th to have a new bandage.  Please call and advise.

## 2020-06-04 DIAGNOSIS — D509 Iron deficiency anemia, unspecified: Secondary | ICD-10-CM | POA: Diagnosis not present

## 2020-06-04 DIAGNOSIS — E119 Type 2 diabetes mellitus without complications: Secondary | ICD-10-CM | POA: Diagnosis not present

## 2020-06-04 DIAGNOSIS — Z4789 Encounter for other orthopedic aftercare: Secondary | ICD-10-CM | POA: Diagnosis not present

## 2020-06-04 DIAGNOSIS — M1990 Unspecified osteoarthritis, unspecified site: Secondary | ICD-10-CM | POA: Diagnosis not present

## 2020-06-04 DIAGNOSIS — E785 Hyperlipidemia, unspecified: Secondary | ICD-10-CM | POA: Diagnosis not present

## 2020-06-04 DIAGNOSIS — M479 Spondylosis, unspecified: Secondary | ICD-10-CM | POA: Diagnosis not present

## 2020-06-04 DIAGNOSIS — I2510 Atherosclerotic heart disease of native coronary artery without angina pectoris: Secondary | ICD-10-CM | POA: Diagnosis not present

## 2020-06-04 DIAGNOSIS — I10 Essential (primary) hypertension: Secondary | ICD-10-CM | POA: Diagnosis not present

## 2020-06-04 DIAGNOSIS — M48061 Spinal stenosis, lumbar region without neurogenic claudication: Secondary | ICD-10-CM | POA: Diagnosis not present

## 2020-06-04 NOTE — Telephone Encounter (Signed)
Talked with patient and advised her of message below per [facility].  Stated that she would have someone pick up Mepilex dressing for her.

## 2020-06-04 NOTE — Telephone Encounter (Signed)
Could you please call patient for me and ask if she can come to office to pick up new Mepilex dressing to put over incision until she is seen in the office? If not, she can replace the bandage she has with a dry dressing and be careful not to get it wet.  I have called with no answer from the [place] office. Thanks.

## 2020-06-05 ENCOUNTER — Inpatient Hospital Stay: Payer: Medicare PPO | Admitting: Orthopaedic Surgery

## 2020-06-05 DIAGNOSIS — I2510 Atherosclerotic heart disease of native coronary artery without angina pectoris: Secondary | ICD-10-CM | POA: Diagnosis not present

## 2020-06-05 DIAGNOSIS — M48061 Spinal stenosis, lumbar region without neurogenic claudication: Secondary | ICD-10-CM | POA: Diagnosis not present

## 2020-06-05 DIAGNOSIS — E785 Hyperlipidemia, unspecified: Secondary | ICD-10-CM | POA: Diagnosis not present

## 2020-06-05 DIAGNOSIS — M1990 Unspecified osteoarthritis, unspecified site: Secondary | ICD-10-CM | POA: Diagnosis not present

## 2020-06-05 DIAGNOSIS — Z4789 Encounter for other orthopedic aftercare: Secondary | ICD-10-CM | POA: Diagnosis not present

## 2020-06-05 DIAGNOSIS — M479 Spondylosis, unspecified: Secondary | ICD-10-CM | POA: Diagnosis not present

## 2020-06-05 DIAGNOSIS — D509 Iron deficiency anemia, unspecified: Secondary | ICD-10-CM | POA: Diagnosis not present

## 2020-06-05 DIAGNOSIS — E119 Type 2 diabetes mellitus without complications: Secondary | ICD-10-CM | POA: Diagnosis not present

## 2020-06-05 DIAGNOSIS — I10 Essential (primary) hypertension: Secondary | ICD-10-CM | POA: Diagnosis not present

## 2020-06-09 ENCOUNTER — Ambulatory Visit (INDEPENDENT_AMBULATORY_CARE_PROVIDER_SITE_OTHER): Payer: Medicare PPO | Admitting: Orthopaedic Surgery

## 2020-06-09 DIAGNOSIS — E785 Hyperlipidemia, unspecified: Secondary | ICD-10-CM | POA: Diagnosis not present

## 2020-06-09 DIAGNOSIS — M479 Spondylosis, unspecified: Secondary | ICD-10-CM | POA: Diagnosis not present

## 2020-06-09 DIAGNOSIS — M1990 Unspecified osteoarthritis, unspecified site: Secondary | ICD-10-CM | POA: Diagnosis not present

## 2020-06-09 DIAGNOSIS — I10 Essential (primary) hypertension: Secondary | ICD-10-CM | POA: Diagnosis not present

## 2020-06-09 DIAGNOSIS — Z4789 Encounter for other orthopedic aftercare: Secondary | ICD-10-CM | POA: Diagnosis not present

## 2020-06-09 DIAGNOSIS — E119 Type 2 diabetes mellitus without complications: Secondary | ICD-10-CM | POA: Diagnosis not present

## 2020-06-09 DIAGNOSIS — D509 Iron deficiency anemia, unspecified: Secondary | ICD-10-CM | POA: Diagnosis not present

## 2020-06-09 DIAGNOSIS — I2510 Atherosclerotic heart disease of native coronary artery without angina pectoris: Secondary | ICD-10-CM | POA: Diagnosis not present

## 2020-06-09 DIAGNOSIS — Z9889 Other specified postprocedural states: Secondary | ICD-10-CM

## 2020-06-09 DIAGNOSIS — M48061 Spinal stenosis, lumbar region without neurogenic claudication: Secondary | ICD-10-CM | POA: Diagnosis not present

## 2020-06-09 MED ORDER — HYDROCODONE-ACETAMINOPHEN 5-325 MG PO TABS
1.0000 | ORAL_TABLET | Freq: Four times a day (QID) | ORAL | 0 refills | Status: DC | PRN
Start: 2020-06-09 — End: 2020-06-30

## 2020-06-09 NOTE — Progress Notes (Signed)
Post-Op Visit Note   Patient: [name]           Date of Birth: [birth year]           MRN: [id number] Visit Date: 06/09/2020 PCP: [name], MD   Assessment & Plan: Postop L2-3, L3-4 decompression.  Staples harvested Steri-Strips applied.  We will wean her to [place] from the oxycodone she was taking she is walk some with a therapist at home without her walker but is using her walker a little bit.  She still has some problems with her throat being sore after intubation and we discussed this should improve with time.  I plan to recheck her in 4 weeks.  Chief Complaint:  Chief Complaint  Patient presents with  . Lower Back - Routine Post Op   Visit Diagnoses:  1. Status post lumbar spine surgery for decompression of spinal cord     Plan: ROV 5 wks  Follow-Up Instructions: No follow-ups on file.   Orders:  No orders of the defined types were placed in this encounter.  Meds ordered this encounter  Medications  . HYDROcodone-acetaminophen (NORCO/VICODIN) 5-325 MG tablet    Sig: Take 1 tablet by mouth every 6 (six) hours as needed for moderate pain.    Dispense:  40 tablet    Refill:  0    Post op pain    Imaging: No results found.  PMFS History: Patient Active Problem List   Diagnosis Date Noted  . Status post lumbar spine surgery for decompression of spinal cord 06/09/2020  . Lumbar stenosis 05/27/2020  . Spinal stenosis of lumbar region 05/21/2019  . Complete tear of left rotator cuff 05/16/2019  . Uncontrolled type 2 diabetes mellitus with hyperglycemia ([place]) 12/19/2018  . Microalbuminuria 04/08/2018  . Genetic testing 09/25/2017  . Family history of breast cancer   . Family history of prostate cancer   . Malignant neoplasm of upper-outer quadrant of right breast in female, estrogen receptor positive ([place]) 06/26/2017  . Lumbar herniated disc 04/15/2015  . Abnormal nuclear stress test 04/03/2015  . 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   . CAD (coronary artery disease), native coronary artery 03/30/2015  . S/P mitral valve repair 11/16/2010  . Dyslipidemia 06/29/2010  . Diabetes ([place]) 09/29/2006  . ANEMIA-IRON DEFICIENCY 09/29/2006  . Essential hypertension 09/29/2006   Past Medical History:  Diagnosis Date  . Abscess in epidural space of L2-L5 lumbar spine 03/2006  . Anemia, iron deficiency   . Arthritis   . Breast cancer ([place]) 06/12/2017   right breast  . CAD (coronary artery disease), native coronary artery 03/30/2015  . Cataract   . Colon polyp    a. Multiple colonic polyps status post colonoscopy in October 2007, consistent with tubular adenoma, tubulovillous adenoma with no high-grade dysplasia or malignancy identified.   . Constipation   . Coronary artery disease    a. s/p CABG 06/2010: S-D2; S-PDA (at time of MV surgery).  . Depression   . Diabetes mellitus    type II  . Family history of breast cancer   . Family history of prostate cancer   . Genetic testing 09/25/2017   Multi-Cancer panel (83 genes) @ [facility] - No pathogenic mutations detected  . GERD (gastroesophageal reflux disease)   . Hx of transient ischemic attack (TIA)    a. See stroke section.  . Hyperlipidemia   . Hypertension   . Hypoxia    a. Has history of acute hypoxic respiratory failure in the  setting of bronchitis/PNA or prior admissions.  [name] MRSA infection    a. History of recurrent skin infection and soft tissue abscesses, with MRSA positive in the past.  . 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 mitral valve - s/p resection and MV repair 06/2010 Dr. [name].  . 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 Personal history of radiation therapy   . Sleep apnea    does not use CPAP  . Stenosis of middle cerebral artery    a. Distal R MCA.  . Stroke [facility])    a. 11/2009: mitral mass diagnosed at this time, also has distal R MCA stenosis, tx with coumadin. b. Readmitted 05/2010 with TIA symptoms - had not been taking Coumadin. s/p  MV surgery 06/2010. Coumadin stopped 2013 after review of chart by Dr. [name] since mass was removed (stroke felt possibly related to this).     Family History  Problem Relation Age of Onset  . Colon cancer Mother        dx 65s; deceased 51  . Irritable bowel syndrome Mother   . Prostate cancer Father        deceased 59s  . Cirrhosis Sister        died of GI bleed associated with cirrhosis of the liver  . Stroke Brother   . Prostate cancer Brother 22       deceased 52  . Diabetes Brother        x3  . Kidney disease Brother   . Cancer Brother 43       unk. type  . Breast cancer Cousin        several maternal 1st and 2nd cousins with breast cancer    Past Surgical History:  Procedure Laterality Date  . BREAST BIOPSY Right 08/26/2019   benign  . BREAST LUMPECTOMY Right   . BREAST LUMPECTOMY WITH RADIOACTIVE SEED AND SENTINEL LYMPH NODE BIOPSY Right 07/20/2017   Procedure: RIGHT BREAST LUMPECTOMY WITH RADIOACTIVE SEED AND SENTINEL LYMPH NODE BIOPSY;  Surgeon: Alphonsa Overall, MD;  Location: [place];  Service: General;  Laterality: Right;  . CARDIAC CATHETERIZATION  04/03/2015   Procedure: Left Heart Cath and Cors/Grafts Angiography;  Surgeon: Peter M Martinique, MD;  Location: [place] CV [facility];  Service: Cardiovascular;;  . CORONARY ARTERY BYPASS GRAFT  1/12  . history of ankle fractures requiring surgery    . LUMBAR LAMINECTOMY/DECOMPRESSION MICRODISCECTOMY Left 04/15/2015   Procedure: LUMBAR LAMINECTOMY/DECOMPRESSION MICRODISCECTOMY 1 LEVEL;  Surgeon: Newman Pies, MD;  Location: [place] [facility];  Service: Neurosurgery;  Laterality: Left;  Left L23 microdiskectomy  . LUMBAR LAMINECTOMY/DECOMPRESSION MICRODISCECTOMY N/A 05/27/2020   Procedure: L2-3, L3-4  decompression;  Surgeon: [name], MD;  Location: [place];  Service: Orthopedics;  Laterality: N/A;  . MV repair and resection of mass  1/12  . RESECTION DISTAL CLAVICAL Left 05/16/2019   Procedure: RESECTION DISTAL CLAVICAL;   Surgeon: Mcarthur Rossetti, MD;  Location: [place];  Service: Orthopedics;  Laterality: Left;  . SHOULDER ARTHROSCOPY WITH ROTATOR CUFF REPAIR AND SUBACROMIAL DECOMPRESSION Left 05/16/2019   Procedure: SHOULDER ARTHROSCOPY WITH ROTATOR CUFF REPAIR AND SUBACROMIAL DECOMPRESSION;  Surgeon: Mcarthur Rossetti, MD;  Location: [place];  Service: Orthopedics;  Laterality: Left;  . TONSILLECTOMY  1971  . TOTAL ABDOMINAL HYSTERECTOMY  1978  . VESICOVAGINAL FISTULA CLOSURE W/ TAH     Social History   Occupational History    Employer: UNEMPLOYED  Tobacco Use  . Smoking status: Former  Smoker    Quit date: 10/14/2010    Years since quitting: 9.6  . Smokeless tobacco: Never Used  Vaping Use  . Vaping Use: Never used  Substance and Sexual Activity  . Alcohol use: Yes    Comment: social  . Drug use: No  . Sexual activity: Not Currently

## 2020-06-10 DIAGNOSIS — M479 Spondylosis, unspecified: Secondary | ICD-10-CM | POA: Diagnosis not present

## 2020-06-10 DIAGNOSIS — E785 Hyperlipidemia, unspecified: Secondary | ICD-10-CM | POA: Diagnosis not present

## 2020-06-10 DIAGNOSIS — M48061 Spinal stenosis, lumbar region without neurogenic claudication: Secondary | ICD-10-CM | POA: Diagnosis not present

## 2020-06-10 DIAGNOSIS — D509 Iron deficiency anemia, unspecified: Secondary | ICD-10-CM | POA: Diagnosis not present

## 2020-06-10 DIAGNOSIS — Z4789 Encounter for other orthopedic aftercare: Secondary | ICD-10-CM | POA: Diagnosis not present

## 2020-06-10 DIAGNOSIS — I10 Essential (primary) hypertension: Secondary | ICD-10-CM | POA: Diagnosis not present

## 2020-06-10 DIAGNOSIS — M1990 Unspecified osteoarthritis, unspecified site: Secondary | ICD-10-CM | POA: Diagnosis not present

## 2020-06-10 DIAGNOSIS — E119 Type 2 diabetes mellitus without complications: Secondary | ICD-10-CM | POA: Diagnosis not present

## 2020-06-10 DIAGNOSIS — I2510 Atherosclerotic heart disease of native coronary artery without angina pectoris: Secondary | ICD-10-CM | POA: Diagnosis not present

## 2020-06-11 DIAGNOSIS — M479 Spondylosis, unspecified: Secondary | ICD-10-CM | POA: Diagnosis not present

## 2020-06-11 DIAGNOSIS — I2510 Atherosclerotic heart disease of native coronary artery without angina pectoris: Secondary | ICD-10-CM | POA: Diagnosis not present

## 2020-06-11 DIAGNOSIS — D509 Iron deficiency anemia, unspecified: Secondary | ICD-10-CM | POA: Diagnosis not present

## 2020-06-11 DIAGNOSIS — I10 Essential (primary) hypertension: Secondary | ICD-10-CM | POA: Diagnosis not present

## 2020-06-11 DIAGNOSIS — E785 Hyperlipidemia, unspecified: Secondary | ICD-10-CM | POA: Diagnosis not present

## 2020-06-11 DIAGNOSIS — M1990 Unspecified osteoarthritis, unspecified site: Secondary | ICD-10-CM | POA: Diagnosis not present

## 2020-06-11 DIAGNOSIS — M48061 Spinal stenosis, lumbar region without neurogenic claudication: Secondary | ICD-10-CM | POA: Diagnosis not present

## 2020-06-11 DIAGNOSIS — Z4789 Encounter for other orthopedic aftercare: Secondary | ICD-10-CM | POA: Diagnosis not present

## 2020-06-11 DIAGNOSIS — E119 Type 2 diabetes mellitus without complications: Secondary | ICD-10-CM | POA: Diagnosis not present

## 2020-06-12 ENCOUNTER — Telehealth: Payer: Medicare PPO | Admitting: Internal Medicine

## 2020-06-12 ENCOUNTER — Telehealth: Payer: Self-pay | Admitting: Orthopaedic Surgery

## 2020-06-12 NOTE — Telephone Encounter (Signed)
noted 

## 2020-06-12 NOTE — Telephone Encounter (Signed)
I called , cancelled PT and OT .   Therapy excercises making her worse. She is walking fine. Called [place] and also pt.    Patient will just work on daily walking .

## 2020-06-12 NOTE — Telephone Encounter (Signed)
Received call from [facility] with [facility] she advised patient canceled visit today due to her experiencing a great deal of pain. Patient had (PT) and the pain is in her back running to her buttocks. Selinda Eon asked if she can get verbal orders to move visit for (OT) to a later date for plan of care. The number to contact Selinda Eon is [phone]

## 2020-06-12 NOTE — Telephone Encounter (Signed)
Ok for order?  

## 2020-06-15 ENCOUNTER — Telehealth: Payer: Medicare PPO | Admitting: Internal Medicine

## 2020-06-15 ENCOUNTER — Other Ambulatory Visit: Payer: Medicare PPO

## 2020-06-15 DIAGNOSIS — Z20822 Contact with and (suspected) exposure to covid-19: Secondary | ICD-10-CM

## 2020-06-17 LAB — SARS-COV-2, NAA 2 DAY TAT

## 2020-06-17 LAB — NOVEL CORONAVIRUS, NAA: SARS-CoV-2, NAA: NOT DETECTED

## 2020-06-18 ENCOUNTER — Telehealth: Payer: Self-pay | Admitting: Orthopaedic Surgery

## 2020-06-18 NOTE — Telephone Encounter (Signed)
Please advise 

## 2020-06-18 NOTE — Telephone Encounter (Signed)
Pt needs medication refill on ROBAXIN please at [facility] on [address]!

## 2020-06-22 ENCOUNTER — Telehealth: Payer: Self-pay | Admitting: Orthopaedic Surgery

## 2020-06-22 ENCOUNTER — Telehealth: Payer: Self-pay | Admitting: Radiology

## 2020-06-22 MED ORDER — METHOCARBAMOL 500 MG PO TABS
500.0000 mg | ORAL_TABLET | Freq: Four times a day (QID) | ORAL | 0 refills | Status: DC
Start: 2020-06-22 — End: 2020-07-29

## 2020-06-22 NOTE — Telephone Encounter (Signed)
I called her , tiny spot bottom of incision , wash with liquid dial soap and small dressing . ROV tomorrow. Call her and give her appt time thanks

## 2020-06-22 NOTE — Telephone Encounter (Signed)
Please advise 

## 2020-06-22 NOTE — Telephone Encounter (Signed)
Appointment made

## 2020-06-22 NOTE — Telephone Encounter (Signed)
Patient called asking for an update about her muscle relaxer. Please send to pharmacy on file and call pt when medication has been called in. Pt phone number is [phone].

## 2020-06-22 NOTE — Telephone Encounter (Signed)
I called patient. She states that she is having drainage from her incision that is green, has a smell, and looks like there is pus. Her sister also told her it looked like the incision is opening. I can work patient in for appt tomorrow morning, however, did not know if there is something else that you recommend until then? OK to wait for appt tomorrow?

## 2020-06-22 NOTE — Telephone Encounter (Signed)
OK refill thanks 

## 2020-06-22 NOTE — Telephone Encounter (Signed)
Patient lmom of triage phone states that her incision is draining.  She would like a call back to discuss this.

## 2020-06-22 NOTE — Telephone Encounter (Signed)
Sent to [facility] per patient request. Patient aware.

## 2020-06-23 ENCOUNTER — Ambulatory Visit (INDEPENDENT_AMBULATORY_CARE_PROVIDER_SITE_OTHER): Payer: Medicare PPO | Admitting: Orthopaedic Surgery

## 2020-06-23 ENCOUNTER — Encounter: Payer: Self-pay | Admitting: Orthopaedic Surgery

## 2020-06-23 VITALS — BP 161/79 | HR 66 | Temp 98.4°F | Ht 67.0 in | Wt 238.0 lb

## 2020-06-23 DIAGNOSIS — Z9889 Other specified postprocedural states: Secondary | ICD-10-CM

## 2020-06-23 MED ORDER — CEPHALEXIN 500 MG PO CAPS: 500.0000 mg | ORAL_CAPSULE | Freq: Two times a day (BID) | ORAL | 1 refills | Status: DC

## 2020-06-23 NOTE — Progress Notes (Signed)
Post-Op Visit Note   Patient: [name]           Date of Birth: [birth year]           MRN: [id number] Visit Date: 06/23/2020 PCP: [name], MD   Assessment & Plan: Post L2-3, L3-4 decompression surgery for spinal stenosis.  Inferior aspect of her wound had opened with slight drainage.  There is 1 cm area with some white necrotic tissue at the base 3 or 4 mm deep.  No purulence no subcutaneous pocket no cellulitis.  We will place her on some Keflex 500 mg p.o. twice daily 10-day supply 1 refill given recheck 2 weeks.  Chief Complaint:  Chief Complaint  Patient presents with  . Lower Back - Wound Check    05/27/2020 L2-3, L3-4 decompression   Visit Diagnoses:  1. Status post lumbar spine surgery for decompression of spinal cord     Plan: Triply clipped close attention to her diabetic medication and diet.  Recheck 2 weeks.  If she runs fever temperature increased drainage she will call.  Follow-Up Instructions: Return in about 2 weeks (around 07/07/2020).   Orders:  No orders of the defined types were placed in this encounter.  Meds ordered this encounter  Medications  . cephALEXin (KEFLEX) 500 MG capsule    Sig: Take 1 capsule (500 mg total) by mouth 2 (two) times daily.    Dispense:  20 capsule    Refill:  1    Imaging: No results found.  PMFS History: Patient Active Problem List   Diagnosis Date Noted  . Status post lumbar spine surgery for decompression of spinal cord 06/09/2020  . Lumbar stenosis 05/27/2020  . Spinal stenosis of lumbar region 05/21/2019  . Complete tear of left rotator cuff 05/16/2019  . Uncontrolled type 2 diabetes mellitus with hyperglycemia ([place]) 12/19/2018  . Microalbuminuria 04/08/2018  . Genetic testing 09/25/2017  . Family history of breast cancer   . Family history of prostate cancer   . Malignant neoplasm of upper-outer quadrant of right breast in female, estrogen receptor positive ([place]) 06/26/2017  . Lumbar  herniated disc 04/15/2015  . Abnormal nuclear stress test 04/03/2015  . 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 CAD (coronary artery disease), native coronary artery 03/30/2015  . S/P mitral valve repair 11/16/2010  . Dyslipidemia 06/29/2010  . Diabetes ([place]) 09/29/2006  . ANEMIA-IRON DEFICIENCY 09/29/2006  . Essential hypertension 09/29/2006   Past Medical History:  Diagnosis Date  . Abscess in epidural space of L2-L5 lumbar spine 03/2006  . Anemia, iron deficiency   . Arthritis   . Breast cancer ([place]) 06/12/2017   right breast  . CAD (coronary artery disease), native coronary artery 03/30/2015  . Cataract   . Colon polyp    a. Multiple colonic polyps status post colonoscopy in October 2007, consistent with tubular adenoma, tubulovillous adenoma with no high-grade dysplasia or malignancy identified.   . Constipation   . Coronary artery disease    a. s/p CABG 06/2010: S-D2; S-PDA (at time of MV surgery).  . Depression   . Diabetes mellitus    type II  . Family history of breast cancer   . Family history of prostate cancer   . Genetic testing 09/25/2017   Multi-Cancer panel (83 genes) @ [facility] - No pathogenic mutations detected  . GERD (gastroesophageal reflux disease)   . Hx of transient ischemic attack (TIA)    a. See stroke section.  . Hyperlipidemia   .  Hypertension   . Hypoxia    a. Has history of acute hypoxic respiratory failure in the setting of bronchitis/PNA or prior admissions.  [name] MRSA infection    a. History of recurrent skin infection and soft tissue abscesses, with MRSA positive in the past.  . 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 mitral valve - s/p resection and MV repair 06/2010 Dr. [name].  . 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 Personal history of radiation therapy   . Sleep apnea    does not use CPAP  . Stenosis of middle cerebral artery    a. Distal R MCA.  . Stroke [facility])    a. 11/2009: mitral mass diagnosed at this time, also has  distal R MCA stenosis, tx with coumadin. b. Readmitted 05/2010 with TIA symptoms - had not been taking Coumadin. s/p MV surgery 06/2010. Coumadin stopped 2013 after review of chart by Dr. [name] since mass was removed (stroke felt possibly related to this).     Family History  Problem Relation Age of Onset  . Colon cancer Mother        dx 64s; deceased 78  . Irritable bowel syndrome Mother   . Prostate cancer Father        deceased 24s  . Cirrhosis Sister        died of GI bleed associated with cirrhosis of the liver  . Stroke Brother   . Prostate cancer Brother 76       deceased 67  . Diabetes Brother        x3  . Kidney disease Brother   . Cancer Brother 80       unk. type  . Breast cancer Cousin        several maternal 1st and 2nd cousins with breast cancer    Past Surgical History:  Procedure Laterality Date  . BREAST BIOPSY Right 08/26/2019   benign  . BREAST LUMPECTOMY Right   . BREAST LUMPECTOMY WITH RADIOACTIVE SEED AND SENTINEL LYMPH NODE BIOPSY Right 07/20/2017   Procedure: RIGHT BREAST LUMPECTOMY WITH RADIOACTIVE SEED AND SENTINEL LYMPH NODE BIOPSY;  Surgeon: Alphonsa Overall, MD;  Location: [place];  Service: General;  Laterality: Right;  . CARDIAC CATHETERIZATION  04/03/2015   Procedure: Left Heart Cath and Cors/Grafts Angiography;  Surgeon: Peter M Martinique, MD;  Location: [place] CV LAB;  Service: Cardiovascular;;  . CORONARY ARTERY BYPASS GRAFT  1/12  . history of ankle fractures requiring surgery    . LUMBAR LAMINECTOMY/DECOMPRESSION MICRODISCECTOMY Left 04/15/2015   Procedure: LUMBAR LAMINECTOMY/DECOMPRESSION MICRODISCECTOMY 1 LEVEL;  Surgeon: Newman Pies, MD;  Location: [place] [facility];  Service: Neurosurgery;  Laterality: Left;  Left L23 microdiskectomy  . LUMBAR LAMINECTOMY/DECOMPRESSION MICRODISCECTOMY N/A 05/27/2020   Procedure: L2-3, L3-4  decompression;  Surgeon: [name], MD;  Location: [place];  Service: Orthopedics;  Laterality: N/A;  . MV repair and  resection of mass  1/12  . RESECTION DISTAL CLAVICAL Left 05/16/2019   Procedure: RESECTION DISTAL CLAVICAL;  Surgeon: Mcarthur Rossetti, MD;  Location: [place];  Service: Orthopedics;  Laterality: Left;  . SHOULDER ARTHROSCOPY WITH ROTATOR CUFF REPAIR AND SUBACROMIAL DECOMPRESSION Left 05/16/2019   Procedure: SHOULDER ARTHROSCOPY WITH ROTATOR CUFF REPAIR AND SUBACROMIAL DECOMPRESSION;  Surgeon: Mcarthur Rossetti, MD;  Location: [place];  Service: Orthopedics;  Laterality: Left;  . TONSILLECTOMY  1971  . TOTAL ABDOMINAL HYSTERECTOMY  1978  . VESICOVAGINAL FISTULA CLOSURE W/ TAH     Social  History   Occupational History    Employer: UNEMPLOYED  Tobacco Use  . Smoking status: Former Smoker    Quit date: 10/14/2010    Years since quitting: 9.6  . Smokeless tobacco: Never Used  Vaping Use  . Vaping Use: Never used  Substance and Sexual Activity  . Alcohol use: Yes    Comment: social  . Drug use: No  . Sexual activity: Not Currently

## 2020-06-25 ENCOUNTER — Other Ambulatory Visit: Payer: Self-pay

## 2020-06-25 ENCOUNTER — Other Ambulatory Visit: Payer: Medicare PPO

## 2020-06-25 DIAGNOSIS — Z20822 Contact with and (suspected) exposure to covid-19: Secondary | ICD-10-CM | POA: Diagnosis not present

## 2020-06-25 LAB — POC COVID19 BINAXNOW: SARS Coronavirus 2 Ag: NEGATIVE

## 2020-06-25 NOTE — Progress Notes (Signed)
Patient had close exposure to positive family member. Patient advised to adhere to 10 day monitoring of symptoms due to incubation period.

## 2020-06-25 NOTE — Patient Instructions (Signed)
COVID-19: What Your Test Results Mean If you test positive for COVID-19 Take steps to help prevent the spread of COVID-19 Stay home.  Do not leave your home, except to get medical care. Do not visit public areas. Get rest and stay hydrated. Take over-the-counter medicines, such as acetaminophen, to help you feel better. Stay in touch with your doctor. Separate yourself from other people.  As much as possible, stay in a specific room and away from other people and pets in your home. If you test negative for COVID-19  You probably were not infected at the time your sample was collected.  However, that does not mean you will not get sick.  It is possible that you were very early in your infection when your sample was collected and that you could test positive later. A negative test result does not mean you won't get sick later. cdc.gov/coronavirus 11/24/2018 This information is not intended to replace advice given to you by your health care provider. Make sure you discuss any questions you have with your health care provider. Document Revised: 05/30/2019 Document Reviewed: 05/30/2019 Elsevier Patient Education  2020 Elsevier Inc.  

## 2020-06-29 ENCOUNTER — Other Ambulatory Visit: Payer: Self-pay | Admitting: Orthopaedic Surgery

## 2020-06-29 ENCOUNTER — Telehealth: Payer: Self-pay | Admitting: Orthopaedic Surgery

## 2020-06-29 MED ORDER — TRAMADOL HCL 50 MG PO TABS
50.0000 mg | ORAL_TABLET | Freq: Two times a day (BID) | ORAL | 0 refills | Status: DC | PRN
Start: 2020-06-29 — End: 2020-06-30

## 2020-06-29 NOTE — Telephone Encounter (Signed)
Patient advised. This medication was called to [place] at [address].  Changed in the pharmacy information, however, it still shows [employer]. It will not let me delete.

## 2020-06-29 NOTE — Telephone Encounter (Signed)
Please advise 

## 2020-06-29 NOTE — Telephone Encounter (Signed)
Been one month since surgery, had # 90 tabs so far. OK to call in ultram 50 mg # 30  one po bid. Let her know time to wean narcotic medication . [facility] thank you

## 2020-06-29 NOTE — Telephone Encounter (Signed)
Patient called requesting a refill of hydrocodone. Please send to [facility] [place]. Patient phone number is [phone].

## 2020-06-29 NOTE — Telephone Encounter (Signed)
Pt called stating the pharmacy didn't receive the rx for tramadol and she would like our office to try sending it again and she would like to be notified when it goes through successfully please.   [phone]

## 2020-06-30 ENCOUNTER — Other Ambulatory Visit: Payer: Self-pay | Admitting: Orthopaedic Surgery

## 2020-06-30 ENCOUNTER — Telehealth: Payer: Self-pay

## 2020-06-30 MED ORDER — TRAMADOL HCL 50 MG PO TABS: 50.0000 mg | ORAL_TABLET | Freq: Two times a day (BID) | ORAL | 0 refills | Status: DC | PRN

## 2020-06-30 NOTE — Telephone Encounter (Signed)
Can you please send tramadol rx that you had asked me to call to [place] at [facility]? They would not accept my call in for tramadol. Thanks.

## 2020-06-30 NOTE — Telephone Encounter (Signed)
Resent please let her know, cancel at [place] location thank you. :)

## 2020-06-30 NOTE — Telephone Encounter (Signed)
duplicate

## 2020-06-30 NOTE — Telephone Encounter (Signed)
I called [facility] on [address] and spoke with Rye, pharmacist, who pulled medication and cancelled rx. I called patient and advised rx has been sent to [facility] on [address] and receipt has been confirmed by pharmacy.

## 2020-06-30 NOTE — Progress Notes (Signed)
I sent in electronically ultram # 30 one po bid. Prn no refills

## 2020-06-30 NOTE — Telephone Encounter (Signed)
This should go to the [facility] at [facility]. It was sent to [facility]  on [address]. Could you please resend? Sorry.

## 2020-06-30 NOTE — Telephone Encounter (Signed)
Patient called rx has been sent to the wrong pharmacy she needs rx to be sent to [facility] on [address]. CB:[phone]  [address], [place], [place] [address]

## 2020-06-30 NOTE — Telephone Encounter (Signed)
Medication was called to [facility] yesterday.  Message has been sent to Dr. [name] for him to send in electronically.

## 2020-06-30 NOTE — Telephone Encounter (Signed)
I sent in [facility]  pt thanks

## 2020-06-30 NOTE — Telephone Encounter (Signed)
Patient called rx has been sent to the wrong pharmacy she needs rx to be sent to [facility] on [address]. [phone]  [address], [place], [place] [address]

## 2020-07-01 ENCOUNTER — Telehealth: Payer: Self-pay | Admitting: Orthopaedic Surgery

## 2020-07-01 NOTE — Telephone Encounter (Signed)
PT called and she is a pt of Dr.Yates and she wants to be seen. I fixed

## 2020-07-06 ENCOUNTER — Other Ambulatory Visit: Payer: Medicare PPO

## 2020-07-06 DIAGNOSIS — Z20822 Contact with and (suspected) exposure to covid-19: Secondary | ICD-10-CM | POA: Diagnosis not present

## 2020-07-07 ENCOUNTER — Encounter: Payer: Self-pay | Admitting: Orthopaedic Surgery

## 2020-07-07 ENCOUNTER — Ambulatory Visit (INDEPENDENT_AMBULATORY_CARE_PROVIDER_SITE_OTHER): Payer: Medicare PPO | Admitting: Orthopaedic Surgery

## 2020-07-07 VITALS — BP 164/93 | HR 79 | Ht 67.0 in | Wt 238.0 lb

## 2020-07-07 DIAGNOSIS — M25612 Stiffness of left shoulder, not elsewhere classified: Secondary | ICD-10-CM

## 2020-07-07 DIAGNOSIS — Z9889 Other specified postprocedural states: Secondary | ICD-10-CM

## 2020-07-07 NOTE — Progress Notes (Signed)
Post-Op Visit Note   Patient: [name]           Date of Birth: 12-11-1950           MRN: [id number] Visit Date: 07/07/2020 PCP: [name], MD   Assessment & Plan: Post decompression L2-3, L3-4.  She has been staying with her brothers.  She normally stays with her daughter with a split-level has 7 steps from the house to the kitchen and bathroom and then 7 steps will get to the bathroom up to the bedroom.  She states her back is doing better incisions well-healed she has a small eschar at the inferior aspect where she had some trace drainage short-term.  She is having increased problems with her left shoulder which had rotator cuff repair by Dr. Zollie Beckers November 2020.  She has not done any exercises on her shoulder since her back surgery and states the shoulder is gotten stiffer and she has trouble not able to reach up to the top of her head.  Chief Complaint:  Chief Complaint  Patient presents with  . Lower Back - Follow-up    05/27/2020 L2-3, L3-4 decompression   Visit Diagnoses:  1. S/P left rotator cuff repair   2. Shoulder stiffness, left     Plan: She is doing well post two-level decompression with improved strength.  I think she could go back living with her daughter at this point.  We will set some therapy up she is able to drive and can do some therapy on her left shoulder to work on range of motion and strengthening.  If she has persistent problems with her shoulder she can return to see Dr. Ninfa Linden.  She is happy with the results of lumbar decompression surgery.  Follow-Up Instructions: Return if symptoms worsen or fail to improve.   Orders:  Orders Placed This Encounter  Procedures  . Ambulatory referral to Physical Therapy   No orders of the defined types were placed in this encounter.   Imaging: No results found.  PMFS History: Patient Active Problem List   Diagnosis Date Noted  . Shoulder stiffness, left 07/07/2020  . Status  post lumbar spine surgery for decompression of spinal cord 06/09/2020  . Lumbar stenosis 05/27/2020  . Spinal stenosis of lumbar region 05/21/2019  . Complete tear of left rotator cuff 05/16/2019  . Uncontrolled type 2 diabetes mellitus with hyperglycemia ([place]) 12/19/2018  . Microalbuminuria 04/08/2018  . Genetic testing 09/25/2017  . Family history of breast cancer   . Family history of prostate cancer   . Malignant neoplasm of upper-outer quadrant of right breast in female, estrogen receptor positive ([place]) 06/26/2017  . Lumbar herniated disc 04/15/2015  . Abnormal nuclear stress test 04/03/2015  . 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 CAD (coronary artery disease), native coronary artery 03/30/2015  . S/P mitral valve repair 11/16/2010  . Dyslipidemia 06/29/2010  . Diabetes ([place]) 09/29/2006  . ANEMIA-IRON DEFICIENCY 09/29/2006  . Essential hypertension 09/29/2006   Past Medical History:  Diagnosis Date  . Abscess in epidural space of L2-L5 lumbar spine 03/2006  . Anemia, iron deficiency   . Arthritis   . Breast cancer ([place]) 06/12/2017   right breast  . CAD (coronary artery disease), native coronary artery 03/30/2015  . Cataract   . Colon polyp    a. Multiple colonic polyps status post colonoscopy in October 2007, consistent with tubular adenoma, tubulovillous adenoma with no high-grade dysplasia or malignancy identified.   . Constipation   .  Coronary artery disease    a. s/p CABG 06/2010: S-D2; S-PDA (at time of MV surgery).  . Depression   . Diabetes mellitus    type II  . Family history of breast cancer   . Family history of prostate cancer   . Genetic testing 09/25/2017   Multi-Cancer panel (83 genes) @ [facility] - No pathogenic mutations detected  . GERD (gastroesophageal reflux disease)   . Hx of transient ischemic attack (TIA)    a. See stroke section.  . Hyperlipidemia   . Hypertension   . Hypoxia    a. Has history of acute hypoxic respiratory failure in  the setting of bronchitis/PNA or prior admissions.  [name] MRSA infection    a. History of recurrent skin infection and soft tissue abscesses, with MRSA positive in the past.  . 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 mitral valve - s/p resection and MV repair 06/2010 Dr. [name].  . 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 Personal history of radiation therapy   . Sleep apnea    does not use CPAP  . Stenosis of middle cerebral artery    a. Distal R MCA.  . Stroke [facility])    a. 11/2009: mitral mass diagnosed at this time, also has distal R MCA stenosis, tx with coumadin. b. Readmitted 05/2010 with TIA symptoms - had not been taking Coumadin. s/p MV surgery 06/2010. Coumadin stopped 2013 after review of chart by Dr. [name] since mass was removed (stroke felt possibly related to this).     Family History  Problem Relation Age of Onset  . Colon cancer Mother        dx 34s; deceased 34  . Irritable bowel syndrome Mother   . Prostate cancer Father        deceased 73s  . Cirrhosis Sister        died of GI bleed associated with cirrhosis of the liver  . Stroke Brother   . Prostate cancer Brother 34       deceased 70  . Diabetes Brother        x3  . Kidney disease Brother   . Cancer Brother 70       unk. type  . Breast cancer Cousin        several maternal 1st and 2nd cousins with breast cancer    Past Surgical History:  Procedure Laterality Date  . BREAST BIOPSY Right 08/26/2019   benign  . BREAST LUMPECTOMY Right   . BREAST LUMPECTOMY WITH RADIOACTIVE SEED AND SENTINEL LYMPH NODE BIOPSY Right 07/20/2017   Procedure: RIGHT BREAST LUMPECTOMY WITH RADIOACTIVE SEED AND SENTINEL LYMPH NODE BIOPSY;  Surgeon: Alphonsa Overall, MD;  Location: [place];  Service: General;  Laterality: Right;  . CARDIAC CATHETERIZATION  04/03/2015   Procedure: Left Heart Cath and Cors/Grafts Angiography;  Surgeon: Peter M Martinique, MD;  Location: [place] CV LAB;  Service: Cardiovascular;;  . CORONARY ARTERY  BYPASS GRAFT  1/12  . history of ankle fractures requiring surgery    . LUMBAR LAMINECTOMY/DECOMPRESSION MICRODISCECTOMY Left 04/15/2015   Procedure: LUMBAR LAMINECTOMY/DECOMPRESSION MICRODISCECTOMY 1 LEVEL;  Surgeon: Newman Pies, MD;  Location: [place] [facility];  Service: Neurosurgery;  Laterality: Left;  Left L23 microdiskectomy  . LUMBAR LAMINECTOMY/DECOMPRESSION MICRODISCECTOMY N/A 05/27/2020   Procedure: L2-3, L3-4  decompression;  Surgeon: [name], MD;  Location: [place];  Service: Orthopedics;  Laterality: N/A;  . MV repair and resection of mass  1/12  . RESECTION DISTAL CLAVICAL Left  05/16/2019   Procedure: RESECTION DISTAL CLAVICAL;  Surgeon: Mcarthur Rossetti, MD;  Location: [place];  Service: Orthopedics;  Laterality: Left;  . SHOULDER ARTHROSCOPY WITH ROTATOR CUFF REPAIR AND SUBACROMIAL DECOMPRESSION Left 05/16/2019   Procedure: SHOULDER ARTHROSCOPY WITH ROTATOR CUFF REPAIR AND SUBACROMIAL DECOMPRESSION;  Surgeon: Mcarthur Rossetti, MD;  Location: [place];  Service: Orthopedics;  Laterality: Left;  . TONSILLECTOMY  1971  . TOTAL ABDOMINAL HYSTERECTOMY  1978  . VESICOVAGINAL FISTULA CLOSURE W/ TAH     Social History   Occupational History    Employer: UNEMPLOYED  Tobacco Use  . Smoking status: Former Smoker    Quit date: 10/14/2010    Years since quitting: 9.7  . Smokeless tobacco: Never Used  Vaping Use  . Vaping Use: Never used  Substance and Sexual Activity  . Alcohol use: Yes    Comment: social  . Drug use: No  . Sexual activity: Not Currently

## 2020-07-08 LAB — SARS-COV-2, NAA 2 DAY TAT

## 2020-07-08 LAB — NOVEL CORONAVIRUS, NAA: SARS-CoV-2, NAA: NOT DETECTED

## 2020-07-09 ENCOUNTER — Other Ambulatory Visit: Payer: Self-pay

## 2020-07-09 ENCOUNTER — Encounter: Payer: Self-pay | Admitting: Physical Therapy

## 2020-07-09 ENCOUNTER — Ambulatory Visit (INDEPENDENT_AMBULATORY_CARE_PROVIDER_SITE_OTHER): Payer: Medicare PPO | Admitting: Physical Therapy

## 2020-07-09 DIAGNOSIS — R600 Localized edema: Secondary | ICD-10-CM

## 2020-07-09 DIAGNOSIS — M25612 Stiffness of left shoulder, not elsewhere classified: Secondary | ICD-10-CM | POA: Diagnosis not present

## 2020-07-09 DIAGNOSIS — R293 Abnormal posture: Secondary | ICD-10-CM

## 2020-07-09 DIAGNOSIS — M25512 Pain in left shoulder: Secondary | ICD-10-CM | POA: Diagnosis not present

## 2020-07-09 NOTE — Therapy (Addendum)
[facility] [place], [place], [address] Phone: [phone]   Fax:  [phone]  Physical Therapy Evaluation  Patient Details  Name: Phila Shoaf MRN: [id number] Date of Birth: [birth year] Referring Provider (PT): [name], MD   Encounter Date: 07/09/2020   PT End of Session - 07/09/20 0919    Visit Number 1    Number of Visits 6    Date for PT Re-Evaluation 08/20/20    Authorization Type [employer]    Progress Note Due on Visit 10    PT Start Time 0850    PT Stop Time 0930    PT Time Calculation (min) 40 min    Activity Tolerance Patient tolerated treatment well    Behavior During Therapy [facility] for tasks assessed/performed           Past Medical History:  Diagnosis Date  . Abscess in epidural space of L2-L5 lumbar spine 03/2006  . Anemia, iron deficiency   . Arthritis   . Breast cancer ([place]) 06/12/2017   right breast  . CAD (coronary artery disease), native coronary artery 03/30/2015  . Cataract   . Colon polyp    a. Multiple colonic polyps status post colonoscopy in October 2007, consistent with tubular adenoma, tubulovillous adenoma with no high-grade dysplasia or malignancy identified.   . Constipation   . Coronary artery disease    a. s/p CABG 06/2010: S-D2; S-PDA (at time of MV surgery).  . Depression   . Diabetes mellitus    type II  . Family history of breast cancer   . Family history of prostate cancer   . Genetic testing 09/25/2017   Multi-Cancer panel (83 genes) @ [facility] - No pathogenic mutations detected  . GERD (gastroesophageal reflux disease)   . Hx of transient ischemic attack (TIA)    a. See stroke section.  . Hyperlipidemia   . Hypertension   . Hypoxia    a. Has history of acute hypoxic respiratory failure in the setting of bronchitis/PNA or prior admissions.  [name] MRSA infection    a. History of recurrent skin infection and soft tissue abscesses, with MRSA positive in the past.  .  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 mitral valve - s/p resection and MV repair 06/2010 Dr. [name].  . 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 Personal history of radiation therapy   . Sleep apnea    does not use CPAP  . Stenosis of middle cerebral artery    a. Distal R MCA.  . Stroke [facility])    a. 11/2009: mitral mass diagnosed at this time, also has distal R MCA stenosis, tx with coumadin. b. Readmitted 05/2010 with TIA symptoms - had not been taking Coumadin. s/p MV surgery 06/2010. Coumadin stopped 2013 after review of chart by Dr. [name] since mass was removed (stroke felt possibly related to this).     Past Surgical History:  Procedure Laterality Date  . BREAST BIOPSY Right 08/26/2019   benign  . BREAST LUMPECTOMY Right   . BREAST LUMPECTOMY WITH RADIOACTIVE SEED AND SENTINEL LYMPH NODE BIOPSY Right 07/20/2017   Procedure: RIGHT BREAST LUMPECTOMY WITH RADIOACTIVE SEED AND SENTINEL LYMPH NODE BIOPSY;  Surgeon: Alphonsa Overall, MD;  Location: [facility];  Service: General;  Laterality: Right;  . CARDIAC CATHETERIZATION  04/03/2015   Procedure: Left Heart Cath and Cors/Grafts Angiography;  Surgeon: Peter M Martinique, MD;  Location: [facility];  Service: Cardiovascular;;  . CORONARY ARTERY BYPASS GRAFT  1/12  . history of ankle fractures requiring surgery    . LUMBAR LAMINECTOMY/DECOMPRESSION MICRODISCECTOMY Left 04/15/2015   Procedure: LUMBAR LAMINECTOMY/DECOMPRESSION MICRODISCECTOMY 1 LEVEL;  Surgeon: Newman Pies, MD;  Location: [facility];  Service: Neurosurgery;  Laterality: Left;  Left L23 microdiskectomy  . LUMBAR LAMINECTOMY/DECOMPRESSION MICRODISCECTOMY N/A 05/27/2020   Procedure: L2-3, L3-4  decompression;  Surgeon: [name], MD;  Location: [facility];  Service: Orthopedics;  Laterality: N/A;  . MV repair and resection of mass  1/12  . RESECTION DISTAL CLAVICAL Left 05/16/2019   Procedure: RESECTION DISTAL CLAVICAL;  Surgeon: Mcarthur Rossetti, MD;  Location:  [facility];  Service: Orthopedics;  Laterality: Left;  . SHOULDER ARTHROSCOPY WITH ROTATOR CUFF REPAIR AND SUBACROMIAL DECOMPRESSION Left 05/16/2019   Procedure: SHOULDER ARTHROSCOPY WITH ROTATOR CUFF REPAIR AND SUBACROMIAL DECOMPRESSION;  Surgeon: Mcarthur Rossetti, MD;  Location: [facility];  Service: Orthopedics;  Laterality: Left;  . TONSILLECTOMY  1971  . TOTAL ABDOMINAL HYSTERECTOMY  1978  . VESICOVAGINAL FISTULA CLOSURE W/ TAH      There were no vitals filed for this visit.    Subjective Assessment - 07/09/20 0851    Subjective Pt is 70 y/o female who returns to OPPT s/p Lt RTC tear in 04/2019 with functional decline.  She was d/c'ed from PT following her surgery at this clinic in Feb 2020 with good strength and ROM.  She then had L2-4 decompression on 05/27/20 and has noticed decrease in Lt shoulder function since surgery.    Pertinent History CVA, HTN, depression, DM, L2-5 abscess in epidural space, arthritis, hx breast cancer, CAD s/p CABG, and L RTC repair 2020.    Limitations Lifting    Patient Stated Goals improve use of Lt arm, raise arm up overhead    Currently in Pain? Yes    Pain Score 0-No pain   up to 8.5/10   Pain Location Shoulder    Pain Orientation Left    Pain Descriptors / Indicators Stabbing;Throbbing;Aching    Pain Type Chronic pain    Pain Onset More than a month ago    Pain Frequency Intermittent    Aggravating Factors  reaching, lifting    Pain Relieving Factors avoiding provoking positions              [facility] PT Assessment - 07/09/20 0855      Assessment   Medical Diagnosis Lt RTC repair    Referring Provider (PT) [name], MD    Onset Date/Surgical Date --   04/2019 - exacerbation in past 2-3 months   Hand Dominance Left    Next MD Visit PRN    Prior Therapy at this clinic following surgery      Precautions   Precautions Fall      Restrictions   Weight Bearing Restrictions No      Balance Screen    Has the patient fallen in the past 6 months No    Has the patient had a decrease in activity level because of a fear of falling?  No    Is the patient reluctant to leave their home because of a fear of falling?  No      Home Environment   Living Environment Private residence    Living Arrangements Other relatives   brother, sister-in law; usually stays with daughter   Additional Comments usually stays with husband, daughter, grandchildren      Prior Function   Level of Independence Independent;Independent with basic  ADLs    Vocation On disability    Leisure travel; trying to walk for exercise following back surgery      Cognition   Overall Cognitive Status Within Functional Limits for tasks assessed      Observation/Other Assessments   Focus on Therapeutic Outcomes (FOTO)  42 (predicted 58)      Posture/Postural Control   Posture/Postural Control Postural limitations    Postural Limitations Rounded Shoulders;Forward head      ROM / Strength   AROM / PROM / Strength AROM;PROM;Strength      AROM   AROM Assessment Site Shoulder    Right/Left Shoulder Left    Left Shoulder Flexion 60 Degrees    Left Shoulder ABduction 40 Degrees      PROM   PROM Assessment Site Shoulder    Right/Left Shoulder Left    Left Shoulder Flexion 132 Degrees    Left Shoulder ABduction 94 Degrees      Strength   Strength Assessment Site Shoulder    Right/Left Shoulder Left    Left Shoulder Flexion 2-/5    Left Shoulder ABduction 2-/5    Left Shoulder Internal Rotation 4/5    Left Shoulder External Rotation 3/5                    Flowsheet Row Outpatient Re[facility]19/[facility]  Lymphedema Life Impact Scale Total Score 41.18 %      Objective measurements completed on examination: See above findings.       [facility] Adult PT Treatment/Exercise - 07/09/20 0855      Exercises   Exercises Other Exercises    Other Exercises  see pt  instructions - pt performed 5-10 reps of each exercise with min cues      Modalities   Modalities Vasopneumatic      Vasopneumatic   Number Minutes Vasopneumatic  10 minutes    Vasopnuematic Location  Shoulder    Vasopneumatic Pressure Low    Vasopneumatic Temperature  34 deg                  PT Education - 07/09/20 0918    Education Details HEP, clinical findings and POC    Person(s) Educated Patient    Methods Explanation;Demonstration;Handout    Comprehension Verbalized understanding;Returned demonstration;Need further instruction            PT Short Term Goals - 07/09/20 0926      PT SHORT TERM GOAL #1   Title independent with initial HEP    Status New    Target Date 07/23/20      PT SHORT TERM GOAL #2   Title Lt shoulder AROM improved by 15 degress each direction for improved function    Status New    Target Date 07/23/20             PT Long Term Goals - 07/09/20 0932      PT LONG TERM GOAL #1   Title independent with advanced HEP    Status New    Target Date 08/20/20      PT LONG TERM GOAL #2   Title improve Lt shoulder AROM to [facility] for improved function    Status New    Target Date 08/20/20      PT LONG TERM GOAL #3   Title report pain < 4/10 with activity for improved function    Status New    Target Date 08/20/20  PT LONG TERM GOAL #4   Title demonstrate at least 3+/5 Lt shoulder strength for improved function    Status New    Target Date 08/20/20      PT LONG TERM GOAL #5   Title FOTO score improved to 38    Status New    Target Date 08/20/20                  Plan - 07/09/20 0920    Clinical Impression Statement Pt is a 70 y/o female who presents to OPPT for new onset of Lt shoulder pain and weakness x 2-3 months.  She has hx of Lt RTC repair in Nov 2020 and successfully completed PT at this facility in Feb 2021.  She recalls episode of trying to lift something too heavy and this may have been when shoulder pain began,  and she also had L2-4 decompression in Dec 2021 so has had some disuse since surgery.  Clinical findings concerning for retear of RTC, but will start with gentle exercises to see if we can get shoulder improved.  Will benefit from PT to address deficits listed.    Personal Factors and Comorbidities Comorbidity 3+;Past/Current Experience    Comorbidities CVA, HTN, depression, DM, L2-5 abscess in epidural space, arthritis, hx breast cancer, CAD s/p CABG, and L RTC repair 2020.    Examination-Activity Limitations Bathing;Sleep;Bed Mobility;Lift;Carry;Reach Overhead;Hygiene/Grooming;Toileting    Examination-Participation Restrictions Community Activity;Meal Prep;Laundry    Stability/Clinical Decision Making Evolving/Moderate complexity    Clinical Decision Making Moderate    Rehab Potential Fair    PT Frequency 1x / week    PT Duration 6 weeks    PT Treatment/Interventions ADLs/Self Care Home Management;Cryotherapy;Electrical Stimulation;Moist Heat;Balance training;Therapeutic exercise;Therapeutic activities;Functional mobility training;Neuromuscular re-education;Patient/family education;Manual techniques;Vasopneumatic Device;Dry needling;Passive range of motion    PT Next Visit Plan review HEP, gentle PROM/AAROM exercises, isometrics    PT Home Exercise Plan Access Code: [email or web address]    Consulted and Agree with Plan of Care Patient           Patient will benefit from skilled therapeutic intervention in order to improve the following deficits and impairments:  Pain,Impaired UE functional use,Decreased strength,Decreased range of motion,Postural dysfunction  Visit Diagnosis: Acute pain of left shoulder - Plan: PT plan of care cert/re-cert  Stiffness of left shoulder, not elsewhere classified - Plan: PT plan of care cert/re-cert  Abnormal posture - Plan: PT plan of care cert/re-cert  Localized edema - Plan: PT plan of care cert/re-cert     Problem List Patient Active Problem List   Diagnosis  Date Noted  . Shoulder stiffness, left 07/07/2020  . Status post lumbar spine surgery for decompression of spinal cord 06/09/2020  . Lumbar stenosis 05/27/2020  . Spinal stenosis of lumbar region 05/21/2019  . Complete tear of left rotator cuff 05/16/2019  . Uncontrolled type 2 diabetes mellitus with hyperglycemia ([place]) 12/19/2018  . Microalbuminuria 04/08/2018  . Genetic testing 09/25/2017  . Family history of breast cancer   . Family history of prostate cancer   . Malignant neoplasm of upper-outer quadrant of right breast in female, estrogen receptor positive ([place]) 06/26/2017  . Lumbar herniated disc 04/15/2015  . Abnormal nuclear stress test 04/03/2015  . 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 CAD (coronary artery disease), native coronary artery 03/30/2015  . S/P mitral valve repair 11/16/2010  . Dyslipidemia 06/29/2010  . Diabetes ([place]) 09/29/2006  . ANEMIA-IRON DEFICIENCY 09/29/2006  . Essential hypertension 09/29/2006      Augusto Garbe  Zigmund Daniel, PT, DPT 07/09/20 9:43 AM    [facility] Physical Therapy [address] [place], [place], [address] Phone: [phone]   Fax:  [phone]  Name: Jackee Glasner MRN: [id number] Date of Birth: 04-14-1951       Referring diagnosis? Z98.890 (ICD-10-CM) - S/P left rotator cuff repair Treatment diagnosis? (if different than referring diagnosis) M25.512, M25.612, R29.3, R60.0 What was this (referring dx) caused by? [x]  Surgery []  Fall [x]  Ongoing issue []  Arthritis []  Other: ____________  Laterality: []  Rt [x]  Lt []  Both  Check all possible CPT codes:      []  97110 (Therapeutic Exercise)  []  92507 (SLP Treatment)  []  53794 (Neuro Re-ed)   []  92526 (Swallowing Treatment)   []  97116 (Gait Training)   []  [id number] (Cognitive Training, 1st 15 minutes) []  97140 (Manual Therapy)   []  97130 (Cognitive Training, each add'l 15 minutes)  []  97530 (Therapeutic Activities)  []  Other, List CPT  Code ____________    []  32761 (Self Care)       [x]  All codes above (97110 - 97535)  []  97012 (Mechanical Traction)  [x]  97014 (E-stim Unattended)  []  97032 (E-stim manual)  []  97033 (Ionto)  [x]  97035 (Ultrasound)  []  97760 (Orthotic Fit) []  97750 (Physical Performance Training) []  [id number] (Aquatic Therapy) []  97034 (Contrast Bath) []  [id number] (Paraffin) []  97597 (Wound Care 1st 20 sq cm) []  47092 (Wound Care each add'l 20 sq cm) [x]  97016 (Vasopneumatic Device) []  [id number] Comptroller) []  [id number] (Prosthetic Training)

## 2020-07-09 NOTE — Patient Instructions (Signed)
Access Code: [email or web address] URL: [email or web address] Date: 07/09/2020 Prepared by: Faustino Congress  Exercises Supine Shoulder Flexion with Dowel - 2 x daily - 7 x weekly - 10 reps - 1 sets - 3-5 sec hold Seated Shoulder Abduction AAROM with Dowel - 2 x daily - 7 x weekly - 3 sets - 10 reps Seated Scapular Retraction - 2 x daily - 7 x weekly - 10 reps - 1 sets - 5 sec hold Standing Backward Shoulder Rolls - 2 x daily - 7 x weekly - 10 reps - 1 sets

## 2020-07-13 ENCOUNTER — Encounter: Payer: Medicare PPO | Admitting: Physical Therapy

## 2020-07-23 ENCOUNTER — Ambulatory Visit (INDEPENDENT_AMBULATORY_CARE_PROVIDER_SITE_OTHER): Payer: Medicare PPO | Admitting: Physical Therapy

## 2020-07-23 ENCOUNTER — Encounter: Payer: Self-pay | Admitting: Physical Therapy

## 2020-07-23 ENCOUNTER — Other Ambulatory Visit: Payer: Self-pay

## 2020-07-23 DIAGNOSIS — M25612 Stiffness of left shoulder, not elsewhere classified: Secondary | ICD-10-CM | POA: Diagnosis not present

## 2020-07-23 DIAGNOSIS — M25512 Pain in left shoulder: Secondary | ICD-10-CM | POA: Diagnosis not present

## 2020-07-23 DIAGNOSIS — R293 Abnormal posture: Secondary | ICD-10-CM

## 2020-07-23 DIAGNOSIS — R600 Localized edema: Secondary | ICD-10-CM | POA: Diagnosis not present

## 2020-07-23 NOTE — Therapy (Signed)
[place], [place], [address] Phone: [phone]   Fax:  [phone]  Physical Therapy Treatment  Patient Details  Name: [name] MRN: [id number] Date of Birth: [birth year] Referring Provider (PT): [name], MD   Encounter Date: 07/23/2020   PT End of Session - 07/23/20 1417    Visit Number 2    Number of Visits 6    Date for PT Re-Evaluation 08/20/20    Authorization Type [employer]    Authorization Time Period 07/09/20-09/06/20    Authorization - Visit Number 2    Authorization - Number of Visits 12    Progress Note Due on Visit 10    PT Start Time 6384    PT Stop Time 1427    PT Time Calculation (min) 42 min    Activity Tolerance Patient tolerated treatment well    Behavior During Therapy [facility] for tasks assessed/performed           Past Medical History:  Diagnosis Date  . Abscess in epidural space of L2-L5 lumbar spine 03/2006  . Anemia, iron deficiency   . Arthritis   . Breast cancer ([place]) 06/12/2017   right breast  . CAD (coronary artery disease), native coronary artery 03/30/2015  . Cataract   . Colon polyp    a. Multiple colonic polyps status post colonoscopy in October 2007, consistent with tubular adenoma, tubulovillous adenoma with no high-grade dysplasia or malignancy identified.   . Constipation   . Coronary artery disease    a. s/p CABG 06/2010: S-D2; S-PDA (at time of MV surgery).  . Depression   . Diabetes mellitus    type II  . Family history of breast cancer   . Family history of prostate cancer   . Genetic testing 09/25/2017   Multi-Cancer panel (83 genes) @ [facility] - No pathogenic mutations detected  . GERD (gastroesophageal reflux disease)   . Hx of transient ischemic attack (TIA)    a. See stroke section.  . Hyperlipidemia   . Hypertension   . Hypoxia    a. Has history of acute hypoxic respiratory failure in the setting of bronchitis/PNA or prior admissions.  [name] MRSA  infection    a. History of recurrent skin infection and soft tissue abscesses, with MRSA positive in the past.  . 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 mitral valve - s/p resection and MV repair 06/2010 Dr. [name].  . 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 Personal history of radiation therapy   . Sleep apnea    does not use CPAP  . Stenosis of middle cerebral artery    a. Distal R MCA.  . Stroke [facility])    a. 11/2009: mitral mass diagnosed at this time, also has distal R MCA stenosis, tx with coumadin. b. Readmitted 05/2010 with TIA symptoms - had not been taking Coumadin. s/p MV surgery 06/2010. Coumadin stopped 2013 after review of chart by Dr. [name] since mass was removed (stroke felt possibly related to this).     Past Surgical History:  Procedure Laterality Date  . BREAST BIOPSY Right 08/26/2019   benign  . BREAST LUMPECTOMY Right   . BREAST LUMPECTOMY WITH RADIOACTIVE SEED AND SENTINEL LYMPH NODE BIOPSY Right 07/20/2017   Procedure: RIGHT BREAST LUMPECTOMY WITH RADIOACTIVE SEED AND SENTINEL LYMPH NODE BIOPSY;  Surgeon: Alphonsa Overall, MD;  Location: [place];  Service: General;  Laterality: Right;  . CARDIAC CATHETERIZATION  04/03/2015   Procedure: Left Heart Cath and  Cors/Grafts Angiography;  Surgeon: Peter M Martinique, MD;  Location: [place] [facility];  Service: Cardiovascular;;  . CORONARY ARTERY BYPASS GRAFT  1/12  . history of ankle fractures requiring surgery    . LUMBAR LAMINECTOMY/DECOMPRESSION MICRODISCECTOMY Left 04/15/2015   Procedure: LUMBAR LAMINECTOMY/DECOMPRESSION MICRODISCECTOMY 1 LEVEL;  Surgeon: Newman Pies, MD;  Location: [place] [facility];  Service: Neurosurgery;  Laterality: Left;  Left L23 microdiskectomy  . LUMBAR LAMINECTOMY/DECOMPRESSION MICRODISCECTOMY N/A 05/27/2020   Procedure: L2-3, L3-4  decompression;  Surgeon: [name], MD;  Location: [facility];  Service: Orthopedics;  Laterality: N/A;  . MV repair and resection of mass  1/12  . RESECTION DISTAL  CLAVICAL Left 05/16/2019   Procedure: RESECTION DISTAL CLAVICAL;  Surgeon: Mcarthur Rossetti, MD;  Location: [place];  Service: Orthopedics;  Laterality: Left;  . SHOULDER ARTHROSCOPY WITH ROTATOR CUFF REPAIR AND SUBACROMIAL DECOMPRESSION Left 05/16/2019   Procedure: SHOULDER ARTHROSCOPY WITH ROTATOR CUFF REPAIR AND SUBACROMIAL DECOMPRESSION;  Surgeon: Mcarthur Rossetti, MD;  Location: [facility];  Service: Orthopedics;  Laterality: Left;  . TONSILLECTOMY  1971  . TOTAL ABDOMINAL HYSTERECTOMY  1978  . VESICOVAGINAL FISTULA CLOSURE W/ TAH      There were no vitals filed for this visit.   Subjective Assessment - 07/23/20 1343    Subjective shoulder is "okay." feels she can move it a little better.  little pain today    Pertinent History CVA, HTN, depression, DM, L2-5 abscess in epidural space, arthritis, hx breast cancer, CAD s/p CABG, and L RTC repair 2020.    Limitations Lifting    Patient Stated Goals improve use of Lt arm, raise arm up overhead    Currently in Pain? Yes    Pain Score 4     Pain Location Shoulder    Pain Orientation Left    Pain Descriptors / Indicators Aching    Pain Type Chronic pain    Pain Onset More than a month ago    Pain Frequency Intermittent    Aggravating Factors  reaching, lifting    Pain Relieving Factors avoiding provoking positions                     Flowsheet Row Outpatient [facility]  Lymphedema Life Impact Scale Total Score 41.18 %            [facility] Adult PT Treatment/Exercise - 07/23/20 1350      Exercises   Exercises Shoulder      Shoulder Exercises: Supine   Flexion Both;20 reps;AAROM   1# bar   Flexion Limitations Lt arm only x 10 reps; active to 90 deg      Shoulder Exercises: Seated   Retraction Both;20 reps   5 second hold   Other Seated Exercises shoulder rolls backward x 25 reps      Shoulder Exercises:  Sidelying   External Rotation Left;20 reps;AROM    ABduction Left;AROM;20 reps      Shoulder Exercises: Standing   Other Standing Exercises wall ladder Lt shoulder flexion 2x10, scaption x 10      Vasopneumatic   Number Minutes Vasopneumatic  10 minutes    Vasopnuematic Location  Shoulder    Vasopneumatic Pressure Low    Vasopneumatic Temperature  34 deg                    PT Short Term Goals - 07/23/20 1419  PT SHORT TERM GOAL #1   Title independent with initial HEP    Status Achieved    Target Date 07/23/20      PT SHORT TERM GOAL #2   Title Lt shoulder AROM improved by 15 degress each direction for improved function    Status New   extended due to missed weeks   Target Date 08/06/20             PT Long Term Goals - 07/09/20 0932      PT LONG TERM GOAL #1   Title independent with advanced HEP    Status New    Target Date 08/20/20      PT LONG TERM GOAL #2   Title improve Lt shoulder AROM to [facility] for improved function    Status New    Target Date 08/20/20      PT LONG TERM GOAL #3   Title report pain < 4/10 with activity for improved function    Status New    Target Date 08/20/20      PT LONG TERM GOAL #4   Title demonstrate at least 3+/5 Lt shoulder strength for improved function    Status New    Target Date 08/20/20      PT LONG TERM GOAL #5   Title FOTO score improved to 58    Status New    Target Date 08/20/20                 Plan - 07/23/20 1419    Clinical Impression Statement Pt is independent with initial HEP meeting STG #1.  Deferred STG #2 at this time and extended date due to missed weeks from weather.  She is demonstrating some improved active movement in the Lt shoulder so hopeful for full recovery.  Will continue to benefit from PT to maximize function.    Personal Factors and Comorbidities Comorbidity 3+;Past/Current Experience    Comorbidities CVA, HTN, depression, DM, L2-5 abscess in epidural space, arthritis, hx  breast cancer, CAD s/p CABG, and L RTC repair 2020.    Examination-Activity Limitations Bathing;Sleep;Bed Mobility;Lift;Carry;Reach Overhead;Hygiene/Grooming;Toileting    Examination-Participation Restrictions Community Activity;Meal Prep;Laundry    Stability/Clinical Decision Making Evolving/Moderate complexity    Rehab Potential Fair    PT Frequency 1x / week    PT Duration 6 weeks    PT Treatment/Interventions ADLs/Self Care Home Management;Cryotherapy;Electrical Stimulation;Moist Heat;Balance training;Therapeutic exercise;Therapeutic activities;Functional mobility training;Neuromuscular re-education;Patient/family education;Manual techniques;Vasopneumatic Device;Dry needling;Passive range of motion    PT Next Visit Plan gentle PROM/AAROM exercises, isometrics    PT Home Exercise Plan Access Code: [email or web address]    Consulted and Agree with Plan of Care Patient           Patient will benefit from skilled therapeutic intervention in order to improve the following deficits and impairments:  Pain,Impaired UE functional use,Decreased strength,Decreased range of motion,Postural dysfunction  Visit Diagnosis: Acute pain of left shoulder  Stiffness of left shoulder, not elsewhere classified  Abnormal posture  Localized edema     Problem List Patient Active Problem List   Diagnosis Date Noted  . Shoulder stiffness, left 07/07/2020  . Status post lumbar spine surgery for decompression of spinal cord 06/09/2020  . Lumbar stenosis 05/27/2020  . Spinal stenosis of lumbar region 05/21/2019  . Complete tear of left rotator cuff 05/16/2019  . Uncontrolled type 2 diabetes mellitus with hyperglycemia ([place]) 12/19/2018  . Microalbuminuria 04/08/2018  . Genetic testing 09/25/2017  . Family history of breast cancer   .  Family history of prostate cancer   . Malignant neoplasm of upper-outer quadrant of right breast in female, estrogen receptor positive ([place]) 06/26/2017  . Lumbar herniated disc  04/15/2015  . Abnormal nuclear stress test 04/03/2015  . 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 CAD (coronary artery disease), native coronary artery 03/30/2015  . S/P mitral valve repair 11/16/2010  . Dyslipidemia 06/29/2010  . Diabetes ([place]) 09/29/2006  . ANEMIA-IRON DEFICIENCY 09/29/2006  . Essential hypertension 09/29/2006      Laureen Abrahams, PT, DPT 07/23/20 2:21 PM    [facility] [address] [place], [place], [address] Phone: [phone]   Fax:  [phone]  Name: [name] MRN: [id number] Date of Birth: 1951/02/21

## 2020-07-24 ENCOUNTER — Other Ambulatory Visit: Payer: Self-pay | Admitting: Internal Medicine

## 2020-07-24 ENCOUNTER — Other Ambulatory Visit: Payer: Self-pay | Admitting: Hematology and Oncology

## 2020-07-24 DIAGNOSIS — K219 Gastro-esophageal reflux disease without esophagitis: Secondary | ICD-10-CM

## 2020-07-24 DIAGNOSIS — E119 Type 2 diabetes mellitus without complications: Secondary | ICD-10-CM

## 2020-07-25 NOTE — Telephone Encounter (Signed)
Requested Prescriptions  Pending Prescriptions Disp Refills  . pantoprazole (PROTONIX) 40 MG tablet [Pharmacy Med Name: PANTOPRAZOLE SODIUM 40 MG Tablet Delayed Release] 90 tablet 3    Sig: TAKE 1 TABLET EVERY DAY     Gastroenterology: Proton Pump Inhibitors Passed - 07/24/2020 10:49 PM      Passed - Valid encounter within last 12 months    Recent Outpatient Visits          2 months ago Essential hypertension   [place], Jarome Matin, RPH-CPP   2 months ago Pre-operative clearance   [place], MD   4 months ago Need for shingles vaccine   [place], Stephen L, RPH-CPP   4 months ago Type 2 diabetes mellitus with obesity [facility])   [place] Karle Plumber B, MD   12 months ago Controlled type 2 diabetes mellitus with other circulatory complication, without long-term current use of insulin [facility])   [place], Deborah B, MD      Future Appointments            In 3 weeks Wynetta Emery Dalbert Batman, MD [place]           . traZODone (DESYREL) 50 MG tablet [Pharmacy Med Name: TRAZODONE HYDROCHLORIDE 50 MG Tablet] 90 tablet 0    Sig: TAKE 1 TABLET AT BEDTIME AS NEEDED FOR SLEEP     Psychiatry: Antidepressants - Serotonin Modulator Passed - 07/24/2020 10:49 PM      Passed - Valid encounter within last 6 months    Recent Outpatient Visits          2 months ago Essential hypertension   [place], Jarome Matin, RPH-CPP   2 months ago Pre-operative clearance   [place], MD   4 months ago Need for shingles vaccine   [place], Annie Main L, RPH-CPP   4 months ago Type 2 diabetes mellitus with obesity [facility])   [place] Karle Plumber B, MD   12 months ago Controlled type 2 diabetes mellitus with other circulatory complication, without long-term current use of insulin [facility])   [place], MD      Future Appointments            In 3 weeks Wynetta Emery Dalbert Batman, MD [place]           . glipiZIDE (GLUCOTROL) 5 MG tablet [Pharmacy Med Name: GLIPIZIDE 5 MG Tablet] 90 tablet 0    Sig: TAKE 1 TABLET DAILY BEFORE BREAKFAST     Endocrinology:  Diabetes - Sulfonylureas Passed - 07/24/2020 10:49 PM      Passed - HBA1C is between 0 and 7.9 and within 180 days    HbA1c, POC (controlled diabetic range)  Date Value Ref Range Status  05/15/2020 7.7 (A) 0.0 - 7.0 % Final   Hgb A1c MFr Bld  Date Value Ref Range Status  05/28/2020 7.9 (H) 4.8 - 5.6 % Final    Comment:    (NOTE) Pre diabetes:          5.7%-6.4%  Diabetes:              >  6.4%  Glycemic control for   <7.0% adults with diabetes          Passed - Valid encounter within last 6 months    Recent Outpatient Visits          2 months ago Essential hypertension   [place], Jarome Matin, RPH-CPP   2 months ago Pre-operative clearance   [place], MD   4 months ago Need for shingles vaccine   [place], Stephen L, RPH-CPP   4 months ago Type 2 diabetes mellitus with obesity [facility])   [place] Karle Plumber B, MD   12 months ago Controlled type 2 diabetes mellitus with other circulatory complication, without long-term current use of insulin [facility])   [place], MD      Future Appointments            In 3 weeks [name], MD [place]

## 2020-07-29 ENCOUNTER — Encounter: Payer: Medicare PPO | Admitting: Physical Therapy

## 2020-07-29 ENCOUNTER — Other Ambulatory Visit: Payer: Self-pay | Admitting: Internal Medicine

## 2020-07-29 DIAGNOSIS — E0800 Diabetes mellitus due to underlying condition with hyperosmolarity without nonketotic hyperglycemic-hyperosmolar coma (NKHHC): Secondary | ICD-10-CM

## 2020-07-29 DIAGNOSIS — Z760 Encounter for issue of repeat prescription: Secondary | ICD-10-CM

## 2020-07-29 MED ORDER — ALBUTEROL SULFATE HFA 108 (90 BASE) MCG/ACT IN AERS: 2.0000 | INHALATION_SPRAY | Freq: Four times a day (QID) | RESPIRATORY_TRACT | 2 refills | Status: DC | PRN

## 2020-07-29 NOTE — Telephone Encounter (Signed)
   Requested medications are on the active medication list yes    Last visit 04/2020  Future visit scheduled yes, 08/17/20  Notes to clinic Tramadol, and Methocarbamol are both Not Delegated, other meds are from providers we do not approve rx for. Please assess.

## 2020-07-29 NOTE — Telephone Encounter (Signed)
Medication Refill - Medication: Albuterol inhaler, tramadol 50mg , methocarbamol, gabapentin 1000mg , and a new accu chek aviva plus meter, lancets and test strips also  Has the patient contacted their pharmacy? Yes.   (Agent: If no, request that the patient contact the pharmacy for the refill.) (Agent: If yes, when and what did the pharmacy advise?)  Preferred Pharmacy (with phone number or street name):[place] [facility] [address]   Agent: Please be advised that RX refills may take up to 3 business days. We ask that you follow-up with your pharmacy.

## 2020-07-30 MED ORDER — ACCU-CHEK AVIVA PLUS VI STRP: ORAL_STRIP | 12 refills | Status: DC

## 2020-07-30 MED ORDER — ACCU-CHEK AVIVA PLUS W/DEVICE KIT: PACK | 0 refills | Status: DC

## 2020-07-30 MED ORDER — ACCU-CHEK SOFTCLIX LANCETS MISC: 12 refills | Status: DC

## 2020-07-30 MED ORDER — METHOCARBAMOL 500 MG PO TABS
500.0000 mg | ORAL_TABLET | Freq: Four times a day (QID) | ORAL | 0 refills | Status: DC
Start: 2020-07-30 — End: 2020-10-02

## 2020-08-05 ENCOUNTER — Encounter: Payer: Self-pay | Admitting: Physical Therapy

## 2020-08-05 ENCOUNTER — Other Ambulatory Visit: Payer: Self-pay

## 2020-08-05 ENCOUNTER — Ambulatory Visit (INDEPENDENT_AMBULATORY_CARE_PROVIDER_SITE_OTHER): Payer: Medicare PPO | Admitting: Physical Therapy

## 2020-08-05 VITALS — BP 149/77

## 2020-08-05 DIAGNOSIS — R600 Localized edema: Secondary | ICD-10-CM

## 2020-08-05 DIAGNOSIS — R293 Abnormal posture: Secondary | ICD-10-CM

## 2020-08-05 DIAGNOSIS — M25512 Pain in left shoulder: Secondary | ICD-10-CM

## 2020-08-05 DIAGNOSIS — M25612 Stiffness of left shoulder, not elsewhere classified: Secondary | ICD-10-CM

## 2020-08-05 NOTE — Therapy (Signed)
[place], [place], [address] Phone: [phone]   Fax:  [phone]  Physical Therapy Treatment  Patient Details  Name: [name] MRN: [id number] Date of Birth: [birth year] Referring Provider (PT): [name], MD   Encounter Date: 08/05/2020   PT End of Session - 08/05/20 1504    Visit Number 3    Number of Visits 6    Date for PT Re-Evaluation 08/20/20    Authorization Type [employer]    Authorization Time Period 07/09/20-09/06/20    Authorization - Visit Number 3    Authorization - Number of Visits 12    Progress Note Due on Visit 10    PT Start Time 1430    PT Stop Time 1510    PT Time Calculation (min) 40 min    Activity Tolerance Patient tolerated treatment well    Behavior During Therapy [facility] for tasks assessed/performed           Past Medical History:  Diagnosis Date  . Abscess in epidural space of L2-L5 lumbar spine 03/2006  . Anemia, iron deficiency   . Arthritis   . Breast cancer ([place]) 06/12/2017   right breast  . CAD (coronary artery disease), native coronary artery 03/30/2015  . Cataract   . Colon polyp    a. Multiple colonic polyps status post colonoscopy in October 2007, consistent with tubular adenoma, tubulovillous adenoma with no high-grade dysplasia or malignancy identified.   . Constipation   . Coronary artery disease    a. s/p CABG 06/2010: S-D2; S-PDA (at time of MV surgery).  . Depression   . Diabetes mellitus    type II  . Family history of breast cancer   . Family history of prostate cancer   . Genetic testing 09/25/2017   Multi-Cancer panel (83 genes) @ [facility] - No pathogenic mutations detected  . GERD (gastroesophageal reflux disease)   . Hx of transient ischemic attack (TIA)    a. See stroke section.  . Hyperlipidemia   . Hypertension   . Hypoxia    a. Has history of acute hypoxic respiratory failure in the setting of bronchitis/PNA or prior admissions.  [name] MRSA  infection    a. History of recurrent skin infection and soft tissue abscesses, with MRSA positive in the past.  . 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 mitral valve - s/p resection and MV repair 06/2010 Dr. [name].  . 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 Personal history of radiation therapy   . Sleep apnea    does not use CPAP  . Stenosis of middle cerebral artery    a. Distal R MCA.  . Stroke [facility])    a. 11/2009: mitral mass diagnosed at this time, also has distal R MCA stenosis, tx with coumadin. b. Readmitted 05/2010 with TIA symptoms - had not been taking Coumadin. s/p MV surgery 06/2010. Coumadin stopped 2013 after review of chart by Dr. [name] since mass was removed (stroke felt possibly related to this).     Past Surgical History:  Procedure Laterality Date  . BREAST BIOPSY Right 08/26/2019   benign  . BREAST LUMPECTOMY Right   . BREAST LUMPECTOMY WITH RADIOACTIVE SEED AND SENTINEL LYMPH NODE BIOPSY Right 07/20/2017   Procedure: RIGHT BREAST LUMPECTOMY WITH RADIOACTIVE SEED AND SENTINEL LYMPH NODE BIOPSY;  Surgeon: Alphonsa Overall, MD;  Location: [place];  Service: General;  Laterality: Right;  . CARDIAC CATHETERIZATION  04/03/2015   Procedure: Left Heart Cath and  Cors/Grafts Angiography;  Surgeon: Peter M Martinique, MD;  Location: [place] CV LAB;  Service: Cardiovascular;;  . CORONARY ARTERY BYPASS GRAFT  1/12  . history of ankle fractures requiring surgery    . LUMBAR LAMINECTOMY/DECOMPRESSION MICRODISCECTOMY Left 04/15/2015   Procedure: LUMBAR LAMINECTOMY/DECOMPRESSION MICRODISCECTOMY 1 LEVEL;  Surgeon: Newman Pies, MD;  Location: [place] [facility];  Service: Neurosurgery;  Laterality: Left;  Left L23 microdiskectomy  . LUMBAR LAMINECTOMY/DECOMPRESSION MICRODISCECTOMY N/A 05/27/2020   Procedure: L2-3, L3-4  decompression;  Surgeon: [name], MD;  Location: [place];  Service: Orthopedics;  Laterality: N/A;  . MV repair and resection of mass  1/12  . RESECTION DISTAL  CLAVICAL Left 05/16/2019   Procedure: RESECTION DISTAL CLAVICAL;  Surgeon: Mcarthur Rossetti, MD;  Location: [place];  Service: Orthopedics;  Laterality: Left;  . SHOULDER ARTHROSCOPY WITH ROTATOR CUFF REPAIR AND SUBACROMIAL DECOMPRESSION Left 05/16/2019   Procedure: SHOULDER ARTHROSCOPY WITH ROTATOR CUFF REPAIR AND SUBACROMIAL DECOMPRESSION;  Surgeon: Mcarthur Rossetti, MD;  Location: [place];  Service: Orthopedics;  Laterality: Left;  . TONSILLECTOMY  1971  . TOTAL ABDOMINAL HYSTERECTOMY  1978  . VESICOVAGINAL FISTULA CLOSURE W/ TAH      Vitals:   08/05/20 1435  BP: (!) 149/77     Subjective Assessment - 08/05/20 1435    Subjective not feeling well today - a little lightheaded and overly fatigued    Pertinent History CVA, HTN, depression, DM, L2-5 abscess in epidural space, arthritis, hx breast cancer, CAD s/p CABG, and L RTC repair 2020.    Limitations Lifting    Patient Stated Goals improve use of Lt arm, raise arm up overhead    Currently in Pain? Yes    Pain Score 4     Pain Location Shoulder    Pain Orientation Left    Pain Descriptors / Indicators Aching    Pain Type Chronic pain    Pain Onset More than a month ago    Pain Frequency Intermittent    Aggravating Factors  reaching, lifting    Pain Relieving Factors avoiding provoking positions                     Flowsheet Row Outpatient [facility]  Lymphedema Life Impact Scale Total Score 41.18 %            [facility] Adult PT Treatment/Exercise - 08/05/20 1441      Shoulder Exercises: Seated   Retraction Both;20 reps   5 second hold   Flexion AAROM;Both;20 reps      Shoulder Exercises: Sidelying   External Rotation Left;20 reps;AROM    Flexion Left;20 reps    ABduction Left;AROM;20 reps      Vasopneumatic   Number Minutes Vasopneumatic  10 minutes    Vasopnuematic Location  Shoulder     Vasopneumatic Pressure Low    Vasopneumatic Temperature  34 deg                    PT Short Term Goals - 08/05/20 1506      PT SHORT TERM GOAL #1   Title independent with initial HEP    Status Achieved    Target Date 07/23/20      PT SHORT TERM GOAL #2   Title Lt shoulder AROM improved by 15 degress each direction for improved function    Status On-going   extended due to missed weeks   Target Date  08/06/20             PT Long Term Goals - 08/05/20 1506      PT LONG TERM GOAL #1   Title independent with advanced HEP    Status On-going    Target Date 08/20/20      PT LONG TERM GOAL #2   Title improve Lt shoulder AROM to [facility] for improved function    Status On-going      PT LONG TERM GOAL #3   Title report pain < 4/10 with activity for improved function    Status On-going      PT LONG TERM GOAL #4   Title demonstrate at least 3+/5 Lt shoulder strength for improved function    Status On-going      PT LONG TERM GOAL #5   Title FOTO score improved to 58    Status On-going                 Plan - 08/05/20 1507    Clinical Impression Statement Pt reported increased fatigue today, and VSS today in clinic.  Limited to sitting and sidelying exercises today.  Will continue to benefit from PT to maximize function.    Personal Factors and Comorbidities Comorbidity 3+;Past/Current Experience    Comorbidities CVA, HTN, depression, DM, L2-5 abscess in epidural space, arthritis, hx breast cancer, CAD s/p CABG, and L RTC repair 2020.    Examination-Activity Limitations Bathing;Sleep;Bed Mobility;Lift;Carry;Reach Overhead;Hygiene/Grooming;Toileting    Examination-Participation Restrictions Community Activity;Meal Prep;Laundry    Stability/Clinical Decision Making Evolving/Moderate complexity    Rehab Potential Fair    PT Frequency 1x / week    PT Duration 6 weeks    PT Treatment/Interventions ADLs/Self Care Home Management;Cryotherapy;Electrical Stimulation;Moist  Heat;Balance training;Therapeutic exercise;Therapeutic activities;Functional mobility training;Neuromuscular re-education;Patient/family education;Manual techniques;Vasopneumatic Device;Dry needling;Passive range of motion    PT Next Visit Plan gentle PROM/AAROM exercises, isometrics    PT Home Exercise Plan Access Code: [email or web address]    Consulted and Agree with Plan of Care Patient           Patient will benefit from skilled therapeutic intervention in order to improve the following deficits and impairments:  Pain,Impaired UE functional use,Decreased strength,Decreased range of motion,Postural dysfunction  Visit Diagnosis: Acute pain of left shoulder  Stiffness of left shoulder, not elsewhere classified  Abnormal posture  Localized edema     Problem List Patient Active Problem List   Diagnosis Date Noted  . Shoulder stiffness, left 07/07/2020  . Status post lumbar spine surgery for decompression of spinal cord 06/09/2020  . Lumbar stenosis 05/27/2020  . Spinal stenosis of lumbar region 05/21/2019  . Complete tear of left rotator cuff 05/16/2019  . Uncontrolled type 2 diabetes mellitus with hyperglycemia ([place]) 12/19/2018  . Microalbuminuria 04/08/2018  . Genetic testing 09/25/2017  . Family history of breast cancer   . Family history of prostate cancer   . Malignant neoplasm of upper-outer quadrant of right breast in female, estrogen receptor positive ([place]) 06/26/2017  . Lumbar herniated disc 04/15/2015  . Abnormal nuclear stress test 04/03/2015  . 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 CAD (coronary artery disease), native coronary artery 03/30/2015  . S/P mitral valve repair 11/16/2010  . Dyslipidemia 06/29/2010  . Diabetes ([place]) 09/29/2006  . ANEMIA-IRON DEFICIENCY 09/29/2006  . Essential hypertension 09/29/2006     Laureen Abrahams, PT, DPT 08/05/20 3:09 PM    [facility] Physical Therapy [address] [place], [place],  [address] Phone: [phone]   Fax:  [phone]  Name: [name] MRN: [id number] Date of Birth: [birth year]

## 2020-08-12 ENCOUNTER — Telehealth: Payer: Self-pay | Admitting: Orthopaedic Surgery

## 2020-08-12 ENCOUNTER — Other Ambulatory Visit: Payer: Self-pay

## 2020-08-12 ENCOUNTER — Ambulatory Visit (INDEPENDENT_AMBULATORY_CARE_PROVIDER_SITE_OTHER): Payer: Medicare PPO | Admitting: Physical Therapy

## 2020-08-12 ENCOUNTER — Encounter: Payer: Self-pay | Admitting: Physical Therapy

## 2020-08-12 DIAGNOSIS — M25612 Stiffness of left shoulder, not elsewhere classified: Secondary | ICD-10-CM

## 2020-08-12 DIAGNOSIS — R600 Localized edema: Secondary | ICD-10-CM

## 2020-08-12 DIAGNOSIS — M25512 Pain in left shoulder: Secondary | ICD-10-CM

## 2020-08-12 DIAGNOSIS — R293 Abnormal posture: Secondary | ICD-10-CM | POA: Diagnosis not present

## 2020-08-12 NOTE — Therapy (Signed)
[place], [place], [address] Phone: [phone]   Fax:  [phone]  Physical Therapy Treatment  Patient Details  Name: [name] MRN: [id number] Date of Birth: 11-May-1951 Referring Provider (PT): [name], MD   Encounter Date: 08/12/2020   PT End of Session - 08/12/20 1455    Visit Number 4    Number of Visits 7    Date for PT Re-Evaluation 09/04/20   extended due to missed weeks   Authorization Type [employer]    Authorization Time Period 07/09/20-09/06/20    Authorization - Visit Number 4    Authorization - Number of Visits 12    Progress Note Due on Visit 10    PT Start Time 3154    PT Stop Time 1505    PT Time Calculation (min) 50 min    Activity Tolerance Patient tolerated treatment well    Behavior During Therapy [facility] for tasks assessed/performed           Past Medical History:  Diagnosis Date  . Abscess in epidural space of L2-L5 lumbar spine 03/2006  . Anemia, iron deficiency   . Arthritis   . Breast cancer ([place]) 06/12/2017   right breast  . CAD (coronary artery disease), native coronary artery 03/30/2015  . Cataract   . Colon polyp    a. Multiple colonic polyps status post colonoscopy in October 2007, consistent with tubular adenoma, tubulovillous adenoma with no high-grade dysplasia or malignancy identified.   . Constipation   . Coronary artery disease    a. s/p CABG 06/2010: S-D2; S-PDA (at time of MV surgery).  . Depression   . Diabetes mellitus    type II  . Family history of breast cancer   . Family history of prostate cancer   . Genetic testing 09/25/2017   Multi-Cancer panel (83 genes) @ [facility] - No pathogenic mutations detected  . GERD (gastroesophageal reflux disease)   . Hx of transient ischemic attack (TIA)    a. See stroke section.  . Hyperlipidemia   . Hypertension   . Hypoxia    a. Has history of acute hypoxic respiratory failure in the setting of bronchitis/PNA  or prior admissions.  [name] MRSA infection    a. History of recurrent skin infection and soft tissue abscesses, with MRSA positive in the past.  . 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 mitral valve - s/p resection and MV repair 06/2010 Dr. [name].  . 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 Personal history of radiation therapy   . Sleep apnea    does not use CPAP  . Stenosis of middle cerebral artery    a. Distal R MCA.  . Stroke [facility])    a. 11/2009: mitral mass diagnosed at this time, also has distal R MCA stenosis, tx with coumadin. b. Readmitted 05/2010 with TIA symptoms - had not been taking Coumadin. s/p MV surgery 06/2010. Coumadin stopped 2013 after review of chart by Dr. [name] since mass was removed (stroke felt possibly related to this).     Past Surgical History:  Procedure Laterality Date  . BREAST BIOPSY Right 08/26/2019   benign  . BREAST LUMPECTOMY Right   . BREAST LUMPECTOMY WITH RADIOACTIVE SEED AND SENTINEL LYMPH NODE BIOPSY Right 07/20/2017   Procedure: RIGHT BREAST LUMPECTOMY WITH RADIOACTIVE SEED AND SENTINEL LYMPH NODE BIOPSY;  Surgeon: Alphonsa Overall, MD;  Location: [place];  Service: General;  Laterality: Right;  . CARDIAC CATHETERIZATION  04/03/2015  Procedure: Left Heart Cath and Cors/Grafts Angiography;  Surgeon: Peter M Martinique, MD;  Location: [place] CV LAB;  Service: Cardiovascular;;  . CORONARY ARTERY BYPASS GRAFT  1/12  . history of ankle fractures requiring surgery    . LUMBAR LAMINECTOMY/DECOMPRESSION MICRODISCECTOMY Left 04/15/2015   Procedure: LUMBAR LAMINECTOMY/DECOMPRESSION MICRODISCECTOMY 1 LEVEL;  Surgeon: Newman Pies, MD;  Location: [place] [facility];  Service: Neurosurgery;  Laterality: Left;  Left L23 microdiskectomy  . LUMBAR LAMINECTOMY/DECOMPRESSION MICRODISCECTOMY N/A 05/27/2020   Procedure: L2-3, L3-4  decompression;  Surgeon: [name], MD;  Location: [place];  Service: Orthopedics;  Laterality: N/A;  . MV repair and resection of  mass  1/12  . RESECTION DISTAL CLAVICAL Left 05/16/2019   Procedure: RESECTION DISTAL CLAVICAL;  Surgeon: Mcarthur Rossetti, MD;  Location: [place];  Service: Orthopedics;  Laterality: Left;  . SHOULDER ARTHROSCOPY WITH ROTATOR CUFF REPAIR AND SUBACROMIAL DECOMPRESSION Left 05/16/2019   Procedure: SHOULDER ARTHROSCOPY WITH ROTATOR CUFF REPAIR AND SUBACROMIAL DECOMPRESSION;  Surgeon: Mcarthur Rossetti, MD;  Location: [place];  Service: Orthopedics;  Laterality: Left;  . TONSILLECTOMY  1971  . TOTAL ABDOMINAL HYSTERECTOMY  1978  . VESICOVAGINAL FISTULA CLOSURE W/ TAH      There were no vitals filed for this visit.   Subjective Assessment - 08/12/20 1417    Subjective feels better today, shoulder feels like it's getting a little better overall    Pertinent History CVA, HTN, depression, DM, L2-5 abscess in epidural space, arthritis, hx breast cancer, CAD s/p CABG, and L RTC repair 2020.    Limitations Lifting    Patient Stated Goals improve use of Lt arm, raise arm up overhead    Currently in Pain? Yes    Pain Score 5     Pain Location Shoulder    Pain Orientation Left    Pain Descriptors / Indicators Aching    Pain Type Chronic pain    Pain Onset More than a month ago    Pain Frequency Intermittent    Aggravating Factors  reaching, lifting    Pain Relieving Factors avoiding provoking positions              [facility] PT Assessment - 08/12/20 1431      Assessment   Medical Diagnosis Lt RTC repair    Referring Provider (PT) [name], MD    Hand Dominance Left      AROM   Overall AROM Comments tested in sitting    Left Shoulder Flexion 135 Degrees    Left Shoulder ABduction 107 Degrees      Strength   Left Shoulder Flexion 3/5    Left Shoulder ABduction 3/5                 Flowsheet Row Outpatient Rehab from 10/19/2017 in [place]  Lymphedema Life Impact Scale Total Score 41.18 %             [facility] Adult PT Treatment/Exercise - 08/12/20 1419      Shoulder Exercises: Supine   Flexion Left;20 reps   3 sets     Shoulder Exercises: Sidelying   External Rotation Left;20 reps;AROM   3 sets   ABduction Left;AROM;20 reps   3 sets     Shoulder Exercises: ROM/Strengthening   Proximal Shoulder Strengthening, Supine 2# ball CW/CCW 3x20 reps each direction                    PT  Short Term Goals - 08/12/20 1456      PT SHORT TERM GOAL #1   Title independent with initial HEP    Status Achieved    Target Date 07/23/20      PT SHORT TERM GOAL #2   Title Lt shoulder AROM improved by 15 degress each direction for improved function    Status Achieved   extended due to missed weeks   Target Date 08/06/20             PT Long Term Goals - 08/05/20 1506      PT LONG TERM GOAL #1   Title independent with advanced HEP    Status On-going    Target Date 08/20/20      PT LONG TERM GOAL #2   Title improve Lt shoulder AROM to [facility] for improved function    Status On-going      PT LONG TERM GOAL #3   Title report pain < 4/10 with activity for improved function    Status On-going      PT LONG TERM GOAL #4   Title demonstrate at least 3+/5 Lt shoulder strength for improved function    Status On-going      PT LONG TERM GOAL #5   Title FOTO score improved to 58    Status On-going                 Plan - 08/12/20 1456    Clinical Impression Statement Pt is demonstrating improvement in AROM at this time with significant improvement in flexion and abduction noted today in sitting.  Will continue to benefit from PT to maximize function.    Personal Factors and Comorbidities Comorbidity 3+;Past/Current Experience    Comorbidities CVA, HTN, depression, DM, L2-5 abscess in epidural space, arthritis, hx breast cancer, CAD s/p CABG, and L RTC repair 2020.    Examination-Activity Limitations Bathing;Sleep;Bed Mobility;Lift;Carry;Reach  Overhead;Hygiene/Grooming;Toileting    Examination-Participation Restrictions Community Activity;Meal Prep;Laundry    Stability/Clinical Decision Making Evolving/Moderate complexity    Rehab Potential Fair    PT Frequency 1x / week    PT Duration 6 weeks    PT Treatment/Interventions ADLs/Self Care Home Management;Cryotherapy;Electrical Stimulation;Moist Heat;Balance training;Therapeutic exercise;Therapeutic activities;Functional mobility training;Neuromuscular re-education;Patient/family education;Manual techniques;Vasopneumatic Device;Dry needling;Passive range of motion    PT Next Visit Plan supine/sidelying exercises to fatigue, shoulder stabilization    PT Home Exercise Plan Access Code: [email or web address]    Consulted and Agree with Plan of Care Patient           Patient will benefit from skilled therapeutic intervention in order to improve the following deficits and impairments:  Pain,Impaired UE functional use,Decreased strength,Decreased range of motion,Postural dysfunction  Visit Diagnosis: Acute pain of left shoulder  Stiffness of left shoulder, not elsewhere classified  Abnormal posture  Localized edema     Problem List Patient Active Problem List   Diagnosis Date Noted  . Shoulder stiffness, left 07/07/2020  . Status post lumbar spine surgery for decompression of spinal cord 06/09/2020  . Lumbar stenosis 05/27/2020  . Spinal stenosis of lumbar region 05/21/2019  . Complete tear of left rotator cuff 05/16/2019  . Uncontrolled type 2 diabetes mellitus with hyperglycemia ([place]) 12/19/2018  . Microalbuminuria 04/08/2018  . Genetic testing 09/25/2017  . Family history of breast cancer   . Family history of prostate cancer   . Malignant neoplasm of upper-outer quadrant of right breast in female, estrogen receptor positive ([place]) 06/26/2017  . Lumbar herniated disc 04/15/2015  . Abnormal nuclear  stress test 04/03/2015  . 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 CAD  (coronary artery disease), native coronary artery 03/30/2015  . S/P mitral valve repair 11/16/2010  . Dyslipidemia 06/29/2010  . Diabetes ([place]) 09/29/2006  . ANEMIA-IRON DEFICIENCY 09/29/2006  . Essential hypertension 09/29/2006      Laureen Abrahams, PT, DPT 08/12/20 2:58 PM    [facility] Physical Therapy [address] [place], [place], [address] Phone: [phone]   Fax:  [phone]  Name: [name] MRN: [id number] Date of Birth: [birth year]

## 2020-08-12 NOTE — Telephone Encounter (Signed)
Patient is here for PT. Would like a refill on Tramadol.

## 2020-08-13 ENCOUNTER — Other Ambulatory Visit: Payer: Self-pay | Admitting: Orthopaedic Surgery

## 2020-08-13 NOTE — Telephone Encounter (Signed)
Patient aware of the below message  

## 2020-08-13 NOTE — Telephone Encounter (Signed)
Please advise 

## 2020-08-13 NOTE — Telephone Encounter (Signed)
OK refill, let her know this is final refill, then use tylenol, heat , ice etc.

## 2020-08-17 ENCOUNTER — Ambulatory Visit: Payer: Medicare PPO | Admitting: Internal Medicine

## 2020-08-25 ENCOUNTER — Telehealth: Payer: Self-pay | Admitting: Physical Therapy

## 2020-08-25 ENCOUNTER — Encounter: Payer: Medicare PPO | Admitting: Physical Therapy

## 2020-08-25 NOTE — Telephone Encounter (Signed)
Spoke to pt as she NS for PT appt.  She apologized as she forgot about appt today due to recent death in the family which was unexpected.  Reminded of next scheduled appt.  Laureen Abrahams, PT, DPT 08/25/20 10:41 AM

## 2020-08-31 ENCOUNTER — Encounter: Payer: Self-pay | Admitting: Physical Therapy

## 2020-08-31 ENCOUNTER — Other Ambulatory Visit: Payer: Self-pay

## 2020-08-31 ENCOUNTER — Ambulatory Visit (INDEPENDENT_AMBULATORY_CARE_PROVIDER_SITE_OTHER): Payer: Medicare PPO | Admitting: Physical Therapy

## 2020-08-31 DIAGNOSIS — M25612 Stiffness of left shoulder, not elsewhere classified: Secondary | ICD-10-CM

## 2020-08-31 DIAGNOSIS — R293 Abnormal posture: Secondary | ICD-10-CM

## 2020-08-31 DIAGNOSIS — M25512 Pain in left shoulder: Secondary | ICD-10-CM

## 2020-08-31 DIAGNOSIS — R600 Localized edema: Secondary | ICD-10-CM

## 2020-08-31 NOTE — Patient Instructions (Signed)
Access Code: [email or web address] URL: [email or web address] Date: 08/31/2020 Prepared by: Faustino Congress  Exercises Seated Scapular Retraction - 2 x daily - 7 x weekly - 10 reps - 1 sets - 5 sec hold Standing Backward Shoulder Rolls - 2 x daily - 7 x weekly - 10 reps - 1 sets Standing Shoulder Flexion Full Range - 1 x daily - 7 x weekly - 3 sets - 10 reps Standing Shoulder Abduction Full Range - 1 x daily - 7 x weekly - 3 sets - 10 reps

## 2020-08-31 NOTE — Therapy (Signed)
[place] [place], [place], [address] Phone: [phone]   Fax:  [phone]  Physical Therapy Treatment/Discharge Summary  Patient Details  Name: [name] MRN: [id number] Date of Birth: 21-Jun-1951 Referring Provider (PT): [name], MD   Encounter Date: 08/31/2020   PT End of Session - 08/31/20 1052    Visit Number 5    Number of Visits 7    Date for PT Re-Evaluation 09/04/20   extended due to missed weeks   Authorization Type [employer]    Authorization Time Period 07/09/20-09/06/20    Authorization - Visit Number 5    Authorization - Number of Visits 12    Progress Note Due on Visit 10    PT Start Time 1010    PT Stop Time 1040    PT Time Calculation (min) 30 min    Activity Tolerance Patient tolerated treatment well    Behavior During Therapy [facility] for tasks assessed/performed           Past Medical History:  Diagnosis Date  . Abscess in epidural space of L2-L5 lumbar spine 03/2006  . Anemia, iron deficiency   . Arthritis   . Breast cancer ([place]) 06/12/2017   right breast  . CAD (coronary artery disease), native coronary artery 03/30/2015  . Cataract   . Colon polyp    a. Multiple colonic polyps status post colonoscopy in October 2007, consistent with tubular adenoma, tubulovillous adenoma with no high-grade dysplasia or malignancy identified.   . Constipation   . Coronary artery disease    a. s/p CABG 06/2010: S-D2; S-PDA (at time of MV surgery).  . Depression   . Diabetes mellitus    type II  . Family history of breast cancer   . Family history of prostate cancer   . Genetic testing 09/25/2017   Multi-Cancer panel (83 genes) @ [facility] - No pathogenic mutations detected  . GERD (gastroesophageal reflux disease)   . Hx of transient ischemic attack (TIA)    a. See stroke section.  . Hyperlipidemia   . Hypertension   . Hypoxia    a. Has history of acute hypoxic respiratory failure in the setting  of bronchitis/PNA or prior admissions.  [name] MRSA infection    a. History of recurrent skin infection and soft tissue abscesses, with MRSA positive in the past.  . 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 mitral valve - s/p resection and MV repair 06/2010 Dr. [name].  . 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 Personal history of radiation therapy   . Sleep apnea    does not use CPAP  . Stenosis of middle cerebral artery    a. Distal R MCA.  . Stroke [facility])    a. 11/2009: mitral mass diagnosed at this time, also has distal R MCA stenosis, tx with coumadin. b. Readmitted 05/2010 with TIA symptoms - had not been taking Coumadin. s/p MV surgery 06/2010. Coumadin stopped 2013 after review of chart by Dr. [name] since mass was removed (stroke felt possibly related to this).     Past Surgical History:  Procedure Laterality Date  . BREAST BIOPSY Right 08/26/2019   benign  . BREAST LUMPECTOMY Right   . BREAST LUMPECTOMY WITH RADIOACTIVE SEED AND SENTINEL LYMPH NODE BIOPSY Right 07/20/2017   Procedure: RIGHT BREAST LUMPECTOMY WITH RADIOACTIVE SEED AND SENTINEL LYMPH NODE BIOPSY;  Surgeon: Alphonsa Overall, MD;  Location: [place];  Service: General;  Laterality: Right;  . CARDIAC CATHETERIZATION  04/03/2015  Procedure: Left Heart Cath and Cors/Grafts Angiography;  Surgeon: Peter M Martinique, MD;  Location: [place] CV LAB;  Service: Cardiovascular;;  . CORONARY ARTERY BYPASS GRAFT  1/12  . history of ankle fractures requiring surgery    . LUMBAR LAMINECTOMY/DECOMPRESSION MICRODISCECTOMY Left 04/15/2015   Procedure: LUMBAR LAMINECTOMY/DECOMPRESSION MICRODISCECTOMY 1 LEVEL;  Surgeon: Newman Pies, MD;  Location: [place] [facility];  Service: Neurosurgery;  Laterality: Left;  Left L23 microdiskectomy  . LUMBAR LAMINECTOMY/DECOMPRESSION MICRODISCECTOMY N/A 05/27/2020   Procedure: L2-3, L3-4  decompression;  Surgeon: [name], MD;  Location: [place];  Service: Orthopedics;  Laterality: N/A;  . MV repair  and resection of mass  1/12  . RESECTION DISTAL CLAVICAL Left 05/16/2019   Procedure: RESECTION DISTAL CLAVICAL;  Surgeon: Mcarthur Rossetti, MD;  Location: [place];  Service: Orthopedics;  Laterality: Left;  . SHOULDER ARTHROSCOPY WITH ROTATOR CUFF REPAIR AND SUBACROMIAL DECOMPRESSION Left 05/16/2019   Procedure: SHOULDER ARTHROSCOPY WITH ROTATOR CUFF REPAIR AND SUBACROMIAL DECOMPRESSION;  Surgeon: Mcarthur Rossetti, MD;  Location: [place];  Service: Orthopedics;  Laterality: Left;  . TONSILLECTOMY  1971  . TOTAL ABDOMINAL HYSTERECTOMY  1978  . VESICOVAGINAL FISTULA CLOSURE W/ TAH      There were no vitals filed for this visit.   Subjective Assessment - 08/31/20 1010    Subjective having a hard time, sciatic pain has flared up and had nephew's funeral yesterday so emotionally drained.  shoulder is doing better though.    Pertinent History CVA, HTN, depression, DM, L2-5 abscess in epidural space, arthritis, hx breast cancer, CAD s/p CABG, and L RTC repair 2020.    Limitations Lifting    Patient Stated Goals improve use of Lt arm, raise arm up overhead    Pain Score 0-No pain              [facility] PT Assessment - 08/31/20 1023      Assessment   Medical Diagnosis Lt RTC repair    Referring Provider (PT) [name], MD      Observation/Other Assessments   Focus on Therapeutic Outcomes (FOTO)  50      AROM   Left Shoulder Flexion 145 Degrees    Left Shoulder ABduction 154 Degrees      Strength   Left Shoulder Flexion 3+/5    Left Shoulder ABduction 3+/5                 Flowsheet Row Outpatient Rehab from 10/19/2017 in [place]  Lymphedema Life Impact Scale Total Score 41.18 %            [facility] Adult PT Treatment/Exercise - 08/31/20 1014      Shoulder Exercises: Seated   Flexion AROM;Left;5 reps    Abduction AROM;Left;5 reps    Other Seated Exercises trial of flexion and  abdct with 3# weight as she has one at home - unable to perform      Shoulder Exercises: Pulleys   Flexion 3 minutes    Scaption 3 minutes                  PT Education - 08/31/20 1052    Education Details HEP    Person(s) Educated Patient    Methods Explanation;Demonstration;Handout    Comprehension Verbalized understanding;Returned demonstration            PT Short Term Goals - 08/12/20 1456      PT SHORT TERM GOAL #1  Title independent with initial HEP    Status Achieved    Target Date 07/23/20      PT SHORT TERM GOAL #2   Title Lt shoulder AROM improved by 15 degress each direction for improved function    Status Achieved   extended due to missed weeks   Target Date 08/06/20             PT Long Term Goals - 08/31/20 1052      PT LONG TERM GOAL #1   Title independent with advanced HEP    Status Achieved      PT LONG TERM GOAL #2   Title improve Lt shoulder AROM to [facility] for improved function    Status Achieved      PT LONG TERM GOAL #3   Title report pain < 4/10 with activity for improved function    Status Achieved      PT LONG TERM GOAL #4   Title demonstrate at least 3+/5 Lt shoulder strength for improved function    Status Achieved      PT LONG TERM GOAL #5   Title FOTO score improved to 58    Baseline 3/7: improved to 50    Status Not Met                 Plan - 08/31/20 1053    Clinical Impression Statement Pt feels her shoulder is now back to baseline and feels ready for d/c from PT today.  All goals met except FOTO goal which was improved from initial eval.  Will d/c PT today.    Personal Factors and Comorbidities Comorbidity 3+;Past/Current Experience    Comorbidities CVA, HTN, depression, DM, L2-5 abscess in epidural space, arthritis, hx breast cancer, CAD s/p CABG, and L RTC repair 2020.    Examination-Activity Limitations Bathing;Sleep;Bed Mobility;Lift;Carry;Reach Overhead;Hygiene/Grooming;Toileting     Examination-Participation Restrictions Community Activity;Meal Prep;Laundry    Stability/Clinical Decision Making Evolving/Moderate complexity    Rehab Potential Fair    PT Frequency 1x / week    PT Duration 6 weeks    PT Treatment/Interventions ADLs/Self Care Home Management;Cryotherapy;Electrical Stimulation;Moist Heat;Balance training;Therapeutic exercise;Therapeutic activities;Functional mobility training;Neuromuscular re-education;Patient/family education;Manual techniques;Vasopneumatic Device;Dry needling;Passive range of motion    PT Next Visit Plan d/c PT today    PT Home Exercise Plan Access Code: [email or web address]    Consulted and Agree with Plan of Care Patient           Patient will benefit from skilled therapeutic intervention in order to improve the following deficits and impairments:  Pain,Impaired UE functional use,Decreased strength,Decreased range of motion,Postural dysfunction  Visit Diagnosis: Acute pain of left shoulder  Stiffness of left shoulder, not elsewhere classified  Abnormal posture  Localized edema     Problem List Patient Active Problem List   Diagnosis Date Noted  . Shoulder stiffness, left 07/07/2020  . Status post lumbar spine surgery for decompression of spinal cord 06/09/2020  . Lumbar stenosis 05/27/2020  . Spinal stenosis of lumbar region 05/21/2019  . Complete tear of left rotator cuff 05/16/2019  . Uncontrolled type 2 diabetes mellitus with hyperglycemia ([place]) 12/19/2018  . Microalbuminuria 04/08/2018  . Genetic testing 09/25/2017  . Family history of breast cancer   . Family history of prostate cancer   . Malignant neoplasm of upper-outer quadrant of right breast in female, estrogen receptor positive ([place]) 06/26/2017  . Lumbar herniated disc 04/15/2015  . Abnormal nuclear stress test 04/03/2015  . 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 CAD (  coronary artery disease), native coronary artery 03/30/2015  . S/P mitral valve repair  11/16/2010  . Dyslipidemia 06/29/2010  . Diabetes ([place]) 09/29/2006  . ANEMIA-IRON DEFICIENCY 09/29/2006  . Essential hypertension 09/29/2006      Laureen Abrahams, PT, DPT 08/31/20 10:54 AM    [facility] Physical Therapy [address] [place], [place], [address] Phone: [phone]   Fax:  [phone]  Name: [name] MRN: [id number] Date of Birth: 1950-08-03      PHYSICAL THERAPY DISCHARGE SUMMARY  Visits from Start of Care: 5  Current functional level related to goals / functional outcomes: See above   Remaining deficits: See above   Education / Equipment: HEP  Plan: Patient agrees to discharge.  Patient goals were partially met. Patient is being discharged due to being pleased with the current functional level.  ?????    Laureen Abrahams, PT, DPT 08/31/20 10:55 AM  [facility] [address] [place], [place], [address] Phone: [phone]   Fax:  [phone]

## 2020-09-07 ENCOUNTER — Encounter: Payer: Medicare PPO | Admitting: Physical Therapy

## 2020-09-15 ENCOUNTER — Other Ambulatory Visit: Payer: Self-pay | Admitting: Internal Medicine

## 2020-09-15 ENCOUNTER — Ambulatory Visit: Payer: Medicare PPO

## 2020-09-15 ENCOUNTER — Ambulatory Visit
Admission: RE | Admit: 2020-09-15 | Discharge: 2020-09-15 | Disposition: A | Discharge status: Home / Self Care | Payer: Medicare PPO | Source: Ambulatory Visit | Attending: Internal Medicine | Admitting: Internal Medicine

## 2020-09-15 ENCOUNTER — Other Ambulatory Visit: Payer: Self-pay

## 2020-09-15 DIAGNOSIS — N6310 Unspecified lump in the right breast, unspecified quadrant: Secondary | ICD-10-CM

## 2020-09-15 DIAGNOSIS — Z139 Encounter for screening, unspecified: Secondary | ICD-10-CM

## 2020-09-15 DIAGNOSIS — Z1231 Encounter for screening mammogram for malignant neoplasm of breast: Secondary | ICD-10-CM | POA: Diagnosis not present

## 2020-10-02 ENCOUNTER — Other Ambulatory Visit: Payer: Self-pay

## 2020-10-02 ENCOUNTER — Ambulatory Visit: Payer: Medicare PPO | Attending: Internal Medicine | Admitting: Internal Medicine

## 2020-10-02 DIAGNOSIS — I10 Essential (primary) hypertension: Secondary | ICD-10-CM

## 2020-10-02 DIAGNOSIS — G8929 Other chronic pain: Secondary | ICD-10-CM | POA: Diagnosis not present

## 2020-10-02 DIAGNOSIS — M5440 Lumbago with sciatica, unspecified side: Secondary | ICD-10-CM | POA: Diagnosis not present

## 2020-10-02 DIAGNOSIS — E1159 Type 2 diabetes mellitus with other circulatory complications: Secondary | ICD-10-CM

## 2020-10-02 DIAGNOSIS — M65342 Trigger finger, left ring finger: Secondary | ICD-10-CM

## 2020-10-02 MED ORDER — METHOCARBAMOL 500 MG PO TABS: 500.0000 mg | ORAL_TABLET | Freq: Three times a day (TID) | ORAL | 4 refills | Status: DC | PRN

## 2020-10-02 MED ORDER — TRAMADOL HCL 50 MG PO TABS: 50.0000 mg | ORAL_TABLET | Freq: Every day | ORAL | 0 refills | Status: DC | PRN

## 2020-10-02 NOTE — Progress Notes (Signed)
Pt states she is having trigger finger in her left ring finger and sciatic nerve pain in back.

## 2020-10-02 NOTE — Progress Notes (Signed)
Virtual Visit via Telephone Note  I connected with [name] on 10/02/2020 at 4:52 p.m by telephone and verified that I am speaking with the correct person using two identifiers  Location: Patient: Home Provider: Office  Participants: Myself Patient RN:  Caleen Jobs    I discussed the limitations, risks, security and privacy concerns of performing an evaluation and management service by telephone and the availability of in person appointments. I also discussed with the patient that there may be a patient responsible charge related to this service. The patient expressed understanding and agreed to proceed.   History of Present Illness: Pt with hx of HTN, DM, HL, depression, chronic back pain, RT breast CA (intraductal CA, ER/PR+, lumpectomy and XRT- completed 08/2017), CKD stage 3, CVA 2011 (residual weakness on LT side), CAD s/p CABG andMVR/resection of fibroelastoma, IDA.  Last visit with me was November 2021.  Purpose of today's visit is chronic disease management.  Since last visit with me she has had decompressive lumbar surgery by Dr. [name] in December.  She has completed physical therapy.  Reports that her back is better but she is still bothered with sciatica symptoms intermittently and pain in the lower back.  She states that Dr. [name] has declined refilling narcotic pain medication for her any further.  She is requesting that I refill the Robaxin and tramadol for her.  States that she does not have to use the tramadol every day but when she has flare of pain, she needs to have something on hand to use.  She feels she is more functional in her home.  Complains of pain in the left hand associated with ring finger getting stuck intermittently.  She has to use her other hand to straighten the finger out when it gets stuck in the flexed position.  This has been going on for a few months and is painful when it occurs.    DM: Lab Results  Component Value Date   HGBA1C 7.9  (H) 05/28/2020   Checks BS 1-2 x/day. Highest 121 Taking Glucotrol, Metformin and Tradjenta. Latter was very expensive on her last refill.  Will not able to afford to refill once she runs out of her current bottle.  She is wondering whether it can be substituted for something else Doing better with eating habits.  Trying to move more since having her back surgery.  HTN:  Compliant with meds which include amlodipine, Cozaar, carvedilol and hydrochlorothiazide.  She checks blood pressure intermittently. BP this a.m 155/60 She limits salt in the foods.  No chest pains or shortness of breath. Outpatient Encounter Medications as of 10/02/2020  Medication Sig Note  . Accu-Chek Softclix Lancets lancets Use as instructed   . albuterol (VENTOLIN HFA) 108 (90 Base) MCG/ACT inhaler Inhale 2 puffs into the lungs every 6 (six) hours as needed for wheezing or shortness of breath.   [name] amLODipine (NORVASC) 10 MG tablet Take 1 tablet (10 mg total) by mouth daily.   . Ascorbic Acid (VITAMIN C PO) Take 500 mg by mouth daily.   [name] aspirin EC 81 MG tablet Take 1 tablet (81 mg total) by mouth daily.   [name] atorvastatin (LIPITOR) 80 MG tablet Take 1 tablet (80 mg total) by mouth daily.   . baclofen (LIORESAL) 10 MG tablet Take 0.5-1 tablets (5-10 mg total) by mouth 3 (three) times daily as needed for muscle spasms.   . Blood Glucose Monitoring Suppl (ACCU-CHEK AVIVA PLUS) w/Device KIT Use as directed   .  buPROPion (WELLBUTRIN XL) 150 MG 24 hr tablet Take 1 tablet (150 mg total) by mouth 2 (two) times daily.   . diclofenac Sodium (VOLTAREN) 1 % GEL APPLY 2 GRAMS TOPICALLY 4 (FOUR) TIMES DAILY AS NEEDED. (Patient taking differently: Apply 2 g topically 4 (four) times daily as needed (pain). APPLY 2 GRAMS TOPICALLY 4 (FOUR) TIMES DAILY AS NEEDED.)   . ferrous sulfate (FEROSUL) 325 (65 FE) MG tablet TAKE 1 TABLET (325 MG TOTAL) BY MOUTH DAILY WITH BREAKFAST.   [name] gabapentin (NEURONTIN) 100 MG capsule Take one at night for a few  days then increase to one twice a day. (Patient taking differently: Take 100 mg by mouth 2 (two) times daily as needed (pain).)   . glipiZIDE (GLUCOTROL) 5 MG tablet TAKE 1 TABLET DAILY BEFORE BREAKFAST   . glucose blood (ACCU-CHEK AVIVA PLUS) test strip Use as instructed   . hydrochlorothiazide (HYDRODIURIL) 25 MG tablet Take 1 tablet (25 mg total) by mouth daily.   [name] letrozole (FEMARA) 2.5 MG tablet TAKE 1 TABLET EVERY DAY   . linagliptin (TRADJENTA) 5 MG TABS tablet Take 1 tablet (5 mg total) by mouth daily.   [name] losartan (COZAAR) 100 MG tablet Take 1 tablet (100 mg total) by mouth daily.   . Melatonin 10 MG TABS Take 1 tablet by mouth at bedtime.   . metFORMIN (GLUCOPHAGE) 1000 MG tablet TAKE 1 TABLET TWICE DAILY WITH MEALS   . methocarbamol (ROBAXIN) 500 MG tablet Take 1 tablet (500 mg total) by mouth 4 (four) times daily.   . pantoprazole (PROTONIX) 40 MG tablet TAKE 1 TABLET EVERY DAY   . polyethylene glycol (MIRALAX / GLYCOLAX) 17 g packet Take 17 g by mouth daily as needed for mild constipation or moderate constipation. 01/20/2020: prn  . traZODone (DESYREL) 50 MG tablet TAKE 1 TABLET AT BEDTIME AS NEEDED FOR SLEEP   . carvedilol (COREG) 12.5 MG tablet Take 1 tablet (12.5 mg total) by mouth 2 (two) times daily.   . cephALEXin (KEFLEX) 500 MG capsule Take 1 capsule (500 mg total) by mouth 2 (two) times daily. (Patient not taking: Reported on 10/02/2020)   . traMADol (ULTRAM) 50 MG tablet Take 1 tablet (50 mg total) by mouth every 12 (twelve) hours as needed. (Patient not taking: Reported on 10/02/2020)    No facility-administered encounter medications on file as of 10/02/2020.      Observations/Objective:   Chemistry      Component Value Date/Time   NA 139 05/19/2020 1449   NA 141 05/15/2020 1104   K 4.0 05/19/2020 1449   CL 104 05/19/2020 1449   CO2 23 05/19/2020 1449   BUN 24 (H) 05/19/2020 1449   BUN 9 05/15/2020 1104   CREATININE 1.67 (H) 05/19/2020 1449   CREATININE 1.17 (H)  04/28/2016 1028      Component Value Date/Time   CALCIUM 9.3 05/19/2020 1449   ALKPHOS 54 05/19/2020 1449   AST 19 05/19/2020 1449   ALT 13 05/19/2020 1449   BILITOT 0.8 05/19/2020 1449   BILITOT 0.3 01/20/2020 1434     Lab Results  Component Value Date   WBC 6.6 05/19/2020   HGB 12.2 05/19/2020   HCT 39.3 05/19/2020   MCV 84.2 05/19/2020   PLT 299 05/19/2020     Assessment and Plan: 1. Controlled type 2 diabetes mellitus with other circulatory complication, without long-term current use of insulin ([place]) Reported blood sugar readings are at goal. She will continue Metformin.  She can no  longer afford the Tradjenta.  I looked at [place].  They all seem that they are not preferred with her insurance.  Therefore, once she stops the Tradjenta, she should increase the Glucotrol to 5 mg twice a day.  Continue to monitor blood sugar  - Hemoglobin A1c; Future - Comprehensive metabolic panel - Ambulatory referral to Ophthalmology  2. Essential hypertension Blood pressure today not at goal. Continue current medications.  Follow-up with clinical pharmacist in 2 weeks for repeat blood pressure check.  Bring blood pressure log with her.  3. Trigger ring finger of left hand - Ambulatory referral to Orthopedic Surgery  4. Chronic low back pain with sciatica, sciatica laterality unspecified, unspecified back pain laterality -Patient status post decompression surgery on the lumbar spine.  She is more functional.  However she still reports some residual pain.  I will refill the methocarbamol.  I told her that I can put her back on the tramadol with the limitation of 1 a day.  She feels this would be helpful to her.  On next face-to-face visit if she is still needing it, we will do a controlled substance prescribing agreement along with a random urine drug screen.  She has been on tramadol in the past and has tolerated well without major side effects.  [place]  controlled substance reporting system reviewed. - methocarbamol (ROBAXIN) 500 MG tablet; Take 1 tablet (500 mg total) by mouth every 8 (eight) hours as needed for muscle spasms.  Dispense: 60 tablet; Refill: 4 - traMADol (ULTRAM) 50 MG tablet; Take 1 tablet (50 mg total) by mouth daily as needed.  Dispense: 30 tablet; Refill: 0   Follow Up Instructions: 2 mths   I discussed the assessment and treatment plan with the patient. The patient was provided an opportunity to ask questions and all were answered. The patient agreed with the plan and demonstrated an understanding of the instructions.   The patient was advised to call back or seek an in-person evaluation if the symptoms worsen or if the condition fails to improve as anticipated.  I  Spent 19 minutes on this telephone encounter  Karle Plumber, MD

## 2020-10-07 ENCOUNTER — Ambulatory Visit (INDEPENDENT_AMBULATORY_CARE_PROVIDER_SITE_OTHER): Payer: Medicare PPO | Admitting: Orthopaedic Surgery

## 2020-10-07 ENCOUNTER — Encounter: Payer: Self-pay | Admitting: Orthopaedic Surgery

## 2020-10-07 DIAGNOSIS — M65341 Trigger finger, right ring finger: Secondary | ICD-10-CM | POA: Diagnosis not present

## 2020-10-07 DIAGNOSIS — M65342 Trigger finger, left ring finger: Secondary | ICD-10-CM

## 2020-10-07 MED ORDER — LIDOCAINE HCL 1 % IJ SOLN
0.5000 mL | INTRAMUSCULAR | Status: AC | PRN
  Administered 2020-10-07: .5 mL

## 2020-10-07 MED ORDER — METHYLPREDNISOLONE ACETATE 40 MG/ML IJ SUSP
20.0000 mg | INTRAMUSCULAR | Status: AC | PRN
  Administered 2020-10-07: 20 mg

## 2020-10-07 NOTE — Progress Notes (Addendum)
Office Visit Note   Patient: [name]           Date of Birth: 1951-04-26           MRN: [id number] Visit Date: 10/07/2020              Requested by: [name], MD [address] [place],  [place] [address] PCP: [name], MD   Assessment & Plan: Visit Diagnoses:  1. Trigger finger, right ring finger   2. Trigger finger, left ring finger     Plan: I did recommend a steroid injection over the A1 pulley and she agreed to this and tolerated.  I would like her to try Voltaren gel with a small drop massage in this area twice a day.  I explained treatment for trigger finger.  If this does not improve or returns she will call us for follow-up.  Otherwise as needed follow-up.  Follow-Up Instructions: Return if symptoms worsen or fail to improve.   Orders:  Orders Placed This Encounter  Procedures  . Hand/UE Inj: R ring A1   Meds ordered this encounter  Medications  . lidocaine (XYLOCAINE) 1 % (with pres) injection 0.5 mL  . methylPREDNISolone acetate (DEPO-MEDROL) injection 20 mg      Procedures: Hand/UE Inj: R ring A1 for trigger finger on 10/07/2020 3:24 PM Medications: 0.5 mL lidocaine 1 %; 20 mg methylPREDNISolone acetate 40 MG/ML      Clinical Data: No additional findings.   Subjective: Chief Complaint  Patient presents with  . Left Ring Finger - Pain  The patient is a 70 year old female seen before.  She comes in for evaluation treatment of active triggering of her left ring finger.  This is been going on for about 2 and half months and is worse in the morning.  She denies any numbness and tingling or injury.  HPI  Review of Systems There is currently listed no fever, chills, nausea, vomiting  Objective: Vital Signs: There were no vitals taken for this visit.  Physical Exam She is alert and orient x3 and in no acute distress Ortho Exam Examination of her left hand does show pain over the A1 pulley of the ring finger and  active triggering.  The remainder of her hand exam is normal. Specialty Comments:  No specialty comments available.  Imaging: No results found.   PMFS History: Patient Active Problem List   Diagnosis Date Noted  . Shoulder stiffness, left 07/07/2020  . Status post lumbar spine surgery for decompression of spinal cord 06/09/2020  . Lumbar stenosis 05/27/2020  . Spinal stenosis of lumbar region 05/21/2019  . Complete tear of left rotator cuff 05/16/2019  . Uncontrolled type 2 diabetes mellitus with hyperglycemia ([place]) 12/19/2018  . Microalbuminuria 04/08/2018  . Genetic testing 09/25/2017  . Family history of breast cancer   . Family history of prostate cancer   . Malignant neoplasm of upper-outer quadrant of right breast in female, estrogen receptor positive ([place]) 06/26/2017  . Lumbar herniated disc 04/15/2015  . Abnormal nuclear stress test 04/03/2015  . 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 CAD (coronary artery disease), native coronary artery 03/30/2015  . S/P mitral valve repair 11/16/2010  . Dyslipidemia 06/29/2010  . Diabetes ([place]) 09/29/2006  . ANEMIA-IRON DEFICIENCY 09/29/2006  . Essential hypertension 09/29/2006   Past Medical History:  Diagnosis Date  . Abscess in epidural space of L2-L5 lumbar spine 03/2006  . Anemia, iron deficiency   . Arthritis   .  Breast cancer ([place]) 06/12/2017   right breast  . CAD (coronary artery disease), native coronary artery 03/30/2015  . Cataract   . Colon polyp    a. Multiple colonic polyps status post colonoscopy in October 2007, consistent with tubular adenoma, tubulovillous adenoma with no high-grade dysplasia or malignancy identified.   . Constipation   . Coronary artery disease    a. s/p CABG 06/2010: S-D2; S-PDA (at time of MV surgery).  . Depression   . Diabetes mellitus    type II  . Family history of breast cancer   . Family history of prostate cancer   . Genetic testing 09/25/2017   Multi-Cancer panel (83  genes) @ [facility] - No pathogenic mutations detected  . GERD (gastroesophageal reflux disease)   . Hx of transient ischemic attack (TIA)    a. See stroke section.  . Hyperlipidemia   . Hypertension   . Hypoxia    a. Has history of acute hypoxic respiratory failure in the setting of bronchitis/PNA or prior admissions.  [name] MRSA infection    a. History of recurrent skin infection and soft tissue abscesses, with MRSA positive in the past.  . 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 mitral valve - s/p resection and MV repair 06/2010 Dr. [name].  . 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 Personal history of radiation therapy   . Sleep apnea    does not use CPAP  . Stenosis of middle cerebral artery    a. Distal R MCA.  . Stroke [facility])    a. 11/2009: mitral mass diagnosed at this time, also has distal R MCA stenosis, tx with coumadin. b. Readmitted 05/2010 with TIA symptoms - had not been taking Coumadin. s/p MV surgery 06/2010. Coumadin stopped 2013 after review of chart by Dr. [name] since mass was removed (stroke felt possibly related to this).     Family History  Problem Relation Age of Onset  . Colon cancer Mother        dx 19s; deceased 44  . Irritable bowel syndrome Mother   . Prostate cancer Father        deceased 22s  . Cirrhosis Sister        died of GI bleed associated with cirrhosis of the liver  . Stroke Brother   . Prostate cancer Brother 85       deceased 36  . Diabetes Brother        x3  . Kidney disease Brother   . Cancer Brother 53       unk. type  . Breast cancer Cousin        several maternal 1st and 2nd cousins with breast cancer    Past Surgical History:  Procedure Laterality Date  . BREAST BIOPSY Right 08/26/2019   benign  . BREAST LUMPECTOMY Right   . BREAST LUMPECTOMY WITH RADIOACTIVE SEED AND SENTINEL LYMPH NODE BIOPSY Right 07/20/2017   Procedure: RIGHT BREAST LUMPECTOMY WITH RADIOACTIVE SEED AND SENTINEL LYMPH NODE BIOPSY;  Surgeon: Alphonsa Overall, MD;  Location: [place];  Service: General;  Laterality: Right;  . CARDIAC CATHETERIZATION  04/03/2015   Procedure: Left Heart Cath and Cors/Grafts Angiography;  Surgeon: Peter M Martinique, MD;  Location: [place] CV LAB;  Service: Cardiovascular;;  . CORONARY ARTERY BYPASS GRAFT  1/12  . history of ankle fractures requiring surgery    . LUMBAR LAMINECTOMY/DECOMPRESSION MICRODISCECTOMY Left 04/15/2015   Procedure: LUMBAR LAMINECTOMY/DECOMPRESSION MICRODISCECTOMY 1 LEVEL;  Surgeon: Newman Pies, MD;  Location:  [place] [facility];  Service: Neurosurgery;  Laterality: Left;  Left L23 microdiskectomy  . LUMBAR LAMINECTOMY/DECOMPRESSION MICRODISCECTOMY N/A 05/27/2020   Procedure: L2-3, L3-4  decompression;  Surgeon: [name], MD;  Location: [facility];  Service: Orthopedics;  Laterality: N/A;  . MV repair and resection of mass  1/12  . RESECTION DISTAL CLAVICAL Left 05/16/2019   Procedure: RESECTION DISTAL CLAVICAL;  Surgeon: Mcarthur Rossetti, MD;  Location: [place];  Service: Orthopedics;  Laterality: Left;  . SHOULDER ARTHROSCOPY WITH ROTATOR CUFF REPAIR AND SUBACROMIAL DECOMPRESSION Left 05/16/2019   Procedure: SHOULDER ARTHROSCOPY WITH ROTATOR CUFF REPAIR AND SUBACROMIAL DECOMPRESSION;  Surgeon: Mcarthur Rossetti, MD;  Location: [facility];  Service: Orthopedics;  Laterality: Left;  . TONSILLECTOMY  1971  . TOTAL ABDOMINAL HYSTERECTOMY  1978  . VESICOVAGINAL FISTULA CLOSURE W/ TAH     Social History   Occupational History    Employer: UNEMPLOYED  Tobacco Use  . Smoking status: Former Smoker    Quit date: 10/14/2010    Years since quitting: 10.0  . Smokeless tobacco: Never Used  Vaping Use  . Vaping Use: Never used  Substance and Sexual Activity  . Alcohol use: Yes    Comment: social  . Drug use: No  . Sexual activity: Not Currently

## 2020-10-08 ENCOUNTER — Telehealth: Payer: Self-pay

## 2020-10-08 NOTE — Telephone Encounter (Signed)
Error

## 2020-10-08 NOTE — Telephone Encounter (Signed)
Pt came and received an injection yesterday and she said her hand is in extreme pain. She has an appt on the 25th to follow up.   Will the pain subside within the next few days?

## 2020-10-08 NOTE — Telephone Encounter (Signed)
That pain should subside over the next few days as the steroid kicks in.

## 2020-10-08 NOTE — Telephone Encounter (Signed)
Called and advised. Pt stated understanding  °

## 2020-10-19 ENCOUNTER — Encounter: Payer: Self-pay | Admitting: Orthopaedic Surgery

## 2020-10-19 ENCOUNTER — Ambulatory Visit (INDEPENDENT_AMBULATORY_CARE_PROVIDER_SITE_OTHER): Payer: Medicare PPO | Admitting: Orthopaedic Surgery

## 2020-10-19 DIAGNOSIS — M65342 Trigger finger, left ring finger: Secondary | ICD-10-CM | POA: Diagnosis not present

## 2020-10-19 NOTE — Progress Notes (Signed)
The patient comes in 2 weeks after I injected her left ring finger at the A1 pulley.  She is 70 years old.  She said the pain is dissipated quite a bit but she still having some triggering.  She has not tried Voltaren gel yet per my recommendation.  On exam there is still some triggering of her left ring finger but no significant pain at the A1 pulley.  I would like her to massage some Voltaren gel over the A1 pulley area of her left ring finger twice daily.  We will see her back in 4 weeks to consider repeat injection.  All questions and concerns were answered and addressed.

## 2020-10-28 ENCOUNTER — Other Ambulatory Visit: Payer: Self-pay | Admitting: Internal Medicine

## 2020-10-28 DIAGNOSIS — G8929 Other chronic pain: Secondary | ICD-10-CM

## 2020-10-28 DIAGNOSIS — M5440 Lumbago with sciatica, unspecified side: Secondary | ICD-10-CM

## 2020-10-28 NOTE — Telephone Encounter (Signed)
Requested medication (s) are due for refill today - yes  Requested medication (s) are on the active medication list -yes  Future visit scheduled -yes  Last refill: 10/02/20  Notes to clinic: Request non delegated Rx  Requested Prescriptions  Pending Prescriptions Disp Refills   traMADol (ULTRAM) 50 MG tablet 30 tablet 0    Sig: Take 1 tablet (50 mg total) by mouth daily as needed.      Not Delegated - Analgesics:  Opioid Agonists Failed - 10/28/2020 11:07 AM      Failed - This refill cannot be delegated      Failed - Urine Drug Screen completed in last 360 days      Passed - Valid encounter within last 6 months    Recent Outpatient Visits           3 weeks ago Controlled type 2 diabetes mellitus with other circulatory complication, without long-term current use of insulin ([place])   [place] [name], MD   5 months ago Essential hypertension   [place], Jarome Matin, RPH-CPP   5 months ago Pre-operative clearance   [place], MD   7 months ago Need for shingles vaccine   [place], Stephen L, RPH-CPP   7 months ago Type 2 diabetes mellitus with obesity [facility])   [place] [name], MD       Future Appointments             In 2 weeks Ninfa Linden Lind Guest, MD [facility]   In 1 month [name], MD [place]                 Requested Prescriptions  Pending Prescriptions Disp Refills   traMADol (ULTRAM) 50 MG tablet 30 tablet 0    Sig: Take 1 tablet (50 mg total) by mouth daily as needed.      Not Delegated - Analgesics:  Opioid Agonists Failed - 10/28/2020 11:07 AM      Failed - This refill cannot be delegated      Failed - Urine Drug Screen completed in last 360 days      Passed - Valid  encounter within last 6 months    Recent Outpatient Visits           3 weeks ago Controlled type 2 diabetes mellitus with other circulatory complication, without long-term current use of insulin ([place])   [place] [name], MD   5 months ago Essential hypertension   [place], Jarome Matin, RPH-CPP   5 months ago Pre-operative clearance   [place], MD   7 months ago Need for shingles vaccine   [place], Stephen L, RPH-CPP   7 months ago Type 2 diabetes mellitus with obesity [facility])   [place], Deborah B, MD       Future Appointments             In 2 weeks Ninfa Linden, Lind Guest, MD [facility]   In 1 month [name], MD [place]

## 2020-10-28 NOTE — Telephone Encounter (Signed)
Medication Refill - Medication:   traMADol (ULTRAM) 50 MG tablet   Has the patient contacted their pharmacy? Yes.  No refills left.    Preferred Pharmacy (with phone number or street name):   [place], [address] [place]  [place] [address]  Phone: [phone] Fax: [phone]     Agent: Please be advised that RX refills may take up to 3 business days. We ask that you follow-up with your pharmacy.

## 2020-10-31 MED ORDER — TRAMADOL HCL 50 MG PO TABS: 50.0000 mg | ORAL_TABLET | Freq: Every day | ORAL | 0 refills | Status: DC | PRN

## 2020-11-16 ENCOUNTER — Ambulatory Visit (INDEPENDENT_AMBULATORY_CARE_PROVIDER_SITE_OTHER): Payer: Medicare PPO | Admitting: Orthopaedic Surgery

## 2020-11-16 ENCOUNTER — Encounter: Payer: Self-pay | Admitting: Orthopaedic Surgery

## 2020-11-16 DIAGNOSIS — M65342 Trigger finger, left ring finger: Secondary | ICD-10-CM | POA: Diagnosis not present

## 2020-11-16 NOTE — Progress Notes (Signed)
The patient is a very pleasant 70 year old female well-known to me.  She has chronic triggering of her left hand ring finger.  We have tried conservative treatment including steroid injections and massaging this area as well as placing anti-inflammatory cream over it.  She said she is still doing okay and not having a lot of pain but the finger does keep triggering.  She is not interested in any more injections.  She is diabetic but reports good control.  Examination of her left hand shows pain over the ring finger A1 pulley and active triggering.  I was able to straighten her finger out and extended after each triggering incident that I witnessed here in the office.  This point I have recommended surgery.  This can be done under light sedation and local anesthesia.  I described what an A1 pulley release involves as well as described the risk and benefits of this surgery.  She is interested in having a left ring finger A1 pulley release in the near future.  We will work on getting this scheduled.  All questions and concerns were answered and addressed.  I described the interoperative and postoperative course as well as wound care.  We will see her back in 2 weeks postoperative.

## 2020-11-20 ENCOUNTER — Other Ambulatory Visit: Payer: Self-pay | Admitting: Internal Medicine

## 2020-11-20 DIAGNOSIS — G8929 Other chronic pain: Secondary | ICD-10-CM

## 2020-11-20 DIAGNOSIS — M5440 Lumbago with sciatica, unspecified side: Secondary | ICD-10-CM

## 2020-11-20 DIAGNOSIS — E1165 Type 2 diabetes mellitus with hyperglycemia: Secondary | ICD-10-CM

## 2020-11-26 ENCOUNTER — Other Ambulatory Visit: Payer: Self-pay | Admitting: Internal Medicine

## 2020-11-26 DIAGNOSIS — G8929 Other chronic pain: Secondary | ICD-10-CM

## 2020-11-26 DIAGNOSIS — M5440 Lumbago with sciatica, unspecified side: Secondary | ICD-10-CM

## 2020-11-26 NOTE — Telephone Encounter (Signed)
Kia from [place] checking on the status of  traMADol (ULTRAM) 50 MG tablet request, faxed on 11/20/2020 to [facility] fax # [phone], chart does not reflect, please advise     [place], [place] Phone:  [phone]  Fax:  [phone]

## 2020-11-28 MED ORDER — TRAMADOL HCL 50 MG PO TABS: 50.0000 mg | ORAL_TABLET | Freq: Every day | ORAL | 1 refills | Status: DC | PRN

## 2020-11-30 ENCOUNTER — Telehealth: Payer: Self-pay

## 2020-11-30 NOTE — Telephone Encounter (Signed)
Patient called she is requesting a call back regarding scheduling surgery for her hand call [phone]

## 2020-12-01 NOTE — Telephone Encounter (Signed)
I called patient and scheduled surgery. 

## 2020-12-03 ENCOUNTER — Other Ambulatory Visit: Payer: Self-pay | Admitting: Internal Medicine

## 2020-12-03 ENCOUNTER — Ambulatory Visit: Payer: Self-pay | Admitting: *Deleted

## 2020-12-03 DIAGNOSIS — M5440 Lumbago with sciatica, unspecified side: Secondary | ICD-10-CM

## 2020-12-03 MED ORDER — METHOCARBAMOL 500 MG PO TABS: 500.0000 mg | ORAL_TABLET | Freq: Three times a day (TID) | ORAL | 0 refills | Status: DC | PRN

## 2020-12-03 NOTE — Telephone Encounter (Signed)
Requesting medication: Chronic pain with Rx that should be on file. Patient has not called mail order pharmacy to verify Rx - Ultram received and mailed- she is going to do that. Patient is also going to call [facility] to check her file for current Rx-Robaxin with RF- should not be out of medication. Patient will follow up with office if she needs office to contact pharmacy for her.

## 2020-12-03 NOTE — Telephone Encounter (Signed)
  Notes to clinic: 2nd request  Pt says she has not heard from [facility], she is in a lot of pain and needs relief now.      Requested Prescriptions  Pending Prescriptions Disp Refills   traMADol (ULTRAM) 50 MG tablet 30 tablet 1    Sig: Take 1 tablet (50 mg total) by mouth daily as needed.      Not Delegated - Analgesics:  Opioid Agonists Failed - 12/03/2020 11:46 AM      Failed - This refill cannot be delegated      Failed - Urine Drug Screen completed in last 360 days      Passed - Valid encounter within last 6 months    Recent Outpatient Visits           2 months ago Controlled type 2 diabetes mellitus with other circulatory complication, without long-term current use of insulin ([place])   [place] [name], MD   6 months ago Essential hypertension   [place], Jarome Matin, RPH-CPP   6 months ago Pre-operative clearance   [place], MD   8 months ago Need for shingles vaccine   [place], Stephen L, RPH-CPP   8 months ago Type 2 diabetes mellitus with obesity [facility])   [place] [name], MD       Future Appointments             In 2 weeks [name], MD [place]               methocarbamol (ROBAXIN) 500 MG tablet 60 tablet 4    Sig: Take 1 tablet (500 mg total) by mouth every 8 (eight) hours as needed for muscle spasms.      Not Delegated - Analgesics:  Muscle Relaxants Failed - 12/03/2020 11:46 AM      Failed - This refill cannot be delegated      Passed - Valid encounter within last 6 months    Recent Outpatient Visits           2 months ago Controlled type 2 diabetes mellitus with other circulatory complication, without long-term current use of insulin ([place])   [place]  [name], MD   6 months ago Essential hypertension   [place], Jarome Matin, RPH-CPP   6 months ago Pre-operative clearance   [place], MD   8 months ago Need for shingles vaccine   [place], Stephen L, RPH-CPP   8 months ago Type 2 diabetes mellitus with obesity [facility])   [place], MD       Future Appointments             In 2 weeks [name], MD [place]

## 2020-12-03 NOTE — Telephone Encounter (Signed)
Medication Refill - Medication: methocarbamol (ROBAXIN) 500 MG tablet traMADol (ULTRAM) 50 MG tablet   Pt says she has not heard from [facility], she is in a lot of pain and needs relief now.   Has the patient contacted their pharmacy? No. (Agent: If no, request that the patient contact the pharmacy for the refill.) (Agent: If yes, when and what did the pharmacy advise?)  Preferred Pharmacy (with phone number or street name):  [place], [place]  [place] [address]  Phone: [phone] Fax: [phone]    Agent: Please be advised that RX refills may take up to 3 business days. We ask that you follow-up with your pharmacy.

## 2020-12-03 NOTE — Telephone Encounter (Signed)
Reason for Disposition  [1] MODERATE back pain (e.g., interferes with normal activities) AND [2] present > 3 days    Requesting medication: Chronic pain with Rx that should be on file. Patient has not called mail order pharmacy to verify Rx - Ultram received and mailed- she is going to do that. Patient is also going to call [facility] to check her file for current Rx-Robaxin with RF- should not be out of medication. Patient will follow up with office if she needs office to contact pharmacy for her.  Answer Assessment - Initial Assessment Questions 1. ONSET: "When did the pain begin?"      Ongoing back pain and sciatica  2. LOCATION: "Where does it hurt?" (upper, mid or lower back)     Lower back pain 3. SEVERITY: "How bad is the pain?"  (e.g., Scale 1-10; mild, moderate, or severe)   - MILD (1-3): doesn't interfere with normal activities    - MODERATE (4-7): interferes with normal activities or awakens from sleep    - SEVERE (8-10): excruciating pain, unable to do any normal activities      Moderate/severe 4. PATTERN: "Is the pain constant?" (e.g., yes, no; constant, intermittent)      Comes and goes 5. RADIATION: "Does the pain shoot into your legs or elsewhere?"     Into legs- sciatica 6. CAUSE:  "What do you think is causing the back pain?"      Diagnosed sciatica and back pain 7. BACK OVERUSE:  "Any recent lifting of heavy objects, strenuous work or exercise?"     s/p back surgery 8. MEDICATIONS: "What have you taken so far for the pain?" (e.g., nothing, acetaminophen, NSAIDS)     tylenol 9. NEUROLOGIC SYMPTOMS: "Do you have any weakness, numbness, or problems with bowel/bladder control?"     Ortho- upcoming hand surgery- trigger finger 10. OTHER SYMPTOMS: "Do you have any other symptoms?" (e.g., fever, abdominal pain, burning with urination, blood in urine)       no 11. PREGNANCY: "Is there any chance you are pregnant?" (e.g., yes, no; LMP)       N/a  Protocols used: Back  Pain-A-AH

## 2020-12-11 ENCOUNTER — Telehealth: Payer: Self-pay | Admitting: Orthopaedic Surgery

## 2020-12-11 NOTE — Telephone Encounter (Signed)
Patient aware.

## 2020-12-11 NOTE — Telephone Encounter (Signed)
Pt called stating she is wanting to get her nails done and wants to make sure she only has to leave her L hand; She would like a CB to make sure it's ok for her to still get her R hand done.   [phone]

## 2020-12-17 ENCOUNTER — Other Ambulatory Visit: Payer: Self-pay | Admitting: Orthopaedic Surgery

## 2020-12-17 DIAGNOSIS — M65342 Trigger finger, left ring finger: Secondary | ICD-10-CM | POA: Diagnosis not present

## 2020-12-17 MED ORDER — HYDROCODONE-ACETAMINOPHEN 5-325 MG PO TABS
1.0000 | ORAL_TABLET | Freq: Four times a day (QID) | ORAL | 0 refills | Status: DC | PRN
Start: 2020-12-17 — End: 2020-12-31

## 2020-12-21 ENCOUNTER — Ambulatory Visit: Payer: Medicare PPO | Admitting: Internal Medicine

## 2020-12-31 ENCOUNTER — Ambulatory Visit (HOSPITAL_BASED_OUTPATIENT_CLINIC_OR_DEPARTMENT_OTHER): Payer: Medicare PPO | Admitting: Pharmacist

## 2020-12-31 ENCOUNTER — Encounter: Payer: Self-pay | Admitting: Orthopaedic Surgery

## 2020-12-31 ENCOUNTER — Encounter: Payer: Self-pay | Admitting: Internal Medicine

## 2020-12-31 ENCOUNTER — Ambulatory Visit: Payer: Medicare PPO | Attending: Internal Medicine | Admitting: Internal Medicine

## 2020-12-31 ENCOUNTER — Other Ambulatory Visit: Payer: Self-pay

## 2020-12-31 ENCOUNTER — Ambulatory Visit (INDEPENDENT_AMBULATORY_CARE_PROVIDER_SITE_OTHER): Payer: Medicare PPO | Admitting: Orthopaedic Surgery

## 2020-12-31 VITALS — BP 158/68 | HR 58 | Resp 16 | Wt 231.4 lb

## 2020-12-31 DIAGNOSIS — Z79899 Other long term (current) drug therapy: Secondary | ICD-10-CM

## 2020-12-31 DIAGNOSIS — Z9889 Other specified postprocedural states: Secondary | ICD-10-CM

## 2020-12-31 DIAGNOSIS — E1159 Type 2 diabetes mellitus with other circulatory complications: Secondary | ICD-10-CM | POA: Diagnosis not present

## 2020-12-31 DIAGNOSIS — G8929 Other chronic pain: Secondary | ICD-10-CM | POA: Diagnosis not present

## 2020-12-31 DIAGNOSIS — N1832 Chronic kidney disease, stage 3b: Secondary | ICD-10-CM | POA: Diagnosis not present

## 2020-12-31 DIAGNOSIS — E785 Hyperlipidemia, unspecified: Secondary | ICD-10-CM

## 2020-12-31 DIAGNOSIS — I25810 Atherosclerosis of coronary artery bypass graft(s) without angina pectoris: Secondary | ICD-10-CM

## 2020-12-31 DIAGNOSIS — E1169 Type 2 diabetes mellitus with other specified complication: Secondary | ICD-10-CM | POA: Diagnosis not present

## 2020-12-31 DIAGNOSIS — I152 Hypertension secondary to endocrine disorders: Secondary | ICD-10-CM | POA: Diagnosis not present

## 2020-12-31 DIAGNOSIS — E669 Obesity, unspecified: Secondary | ICD-10-CM

## 2020-12-31 DIAGNOSIS — M545 Low back pain, unspecified: Secondary | ICD-10-CM

## 2020-12-31 LAB — POCT GLYCOSYLATED HEMOGLOBIN (HGB A1C): HbA1c, POC (controlled diabetic range): 8.8 % — AB (ref 0.0–7.0)

## 2020-12-31 LAB — GLUCOSE, POCT (MANUAL RESULT ENTRY): POC Glucose: 177 mg/dl — AB (ref 70–99)

## 2020-12-31 MED ORDER — METFORMIN HCL 1000 MG PO TABS: 1000.0000 mg | ORAL_TABLET | Freq: Two times a day (BID) | ORAL | 2 refills | Status: DC

## 2020-12-31 NOTE — Progress Notes (Signed)
The patient is 2 weeks status post a left ring finger A1 pulley release for triggering.  The incision looks good.  She is having pain at the incision to be expected.  There is no evidence of infection.  I did remove the sutures and placed Steri-Strips.  I did talk to her about wound care.  There is no triggering of the finger.  All question concerns were answered and addressed.  Follow-up can be as needed.

## 2020-12-31 NOTE — Progress Notes (Signed)
Patient was educated on the use of the Trulicity pen. Reviewed necessary supplies and operation of the pen. Also reviewed goal blood glucose levels. Patient was able to demonstrate use. All questions and concerns were addressed.  Time spent face-to-face counseling: 10 minutes  Benard Halsted, PharmD, [place], [place] [phone]

## 2020-12-31 NOTE — Progress Notes (Signed)
Patient ID: [name], female    DOB: Nov 17, 1950  MRN: [id number]  CC: No chief complaint on file.   Subjective: [name] is a 70 y.o. female who presents for chronic ds management Her concerns today include:  Pt with hx of HTN, DM, HL, depression, chronic back pain, RT breast CA (intraductal CA, ER/PR+, lumpectomy and XRT - completed 08/2017), CKD stage 3, CVA 2011 (residual weakness on LT side), CAD s/p CABG and MVR/resection of fibroelastoma, IDA.   HTN/CAD:  BP elev.  Did not take meds as yet today.  For blood pressure she should be on carvedilol, Cozaar and amlodipine. Was checking BP it at home but does not have log with her.  Gives range in the 150s/70-80 Some days she forgets to take the evening dose of Coreg. Reports compliance with aspirin and atorvastatin. No CP/SOB with activity or rest.  No LE edema   DM/obesity:   Results for orders placed or performed in visit on 12/31/20  POCT glucose (manual entry)  Result Value Ref Range   POC Glucose 177 (A) 70 - 99 mg/dl  POCT glycosylated hemoglobin (Hb A1C)  Result Value Ref Range   Hemoglobin A1C     HbA1c POC (<> result, manual entry)     HbA1c, POC (prediabetic range)     HbA1c, POC (controlled diabetic range) 8.8 (A) 0.0 - 7.0 %   -out of Metformin for 3-4 wks.  Taking Glucotrol once a day instead of BID -checks BS once a day before BF.  Does not have log.  Gives range 70-120s Reports decrease appetite.  She tries to avoid too much white carbs -moving more.  No longer has to use cane.  Has been busy going to other relatives houses to help care for them.  Had surgery on LT hand for trigger finger recently by Dr. Ninfa Linden.  Anemia:  taking iron.  Denies any dizziness.  Back Pain: doing better.  Walking without cane.  Pain flares with prolong standing, walking and when standing to do dishes.  Takes Tramadol 1-2 x a day.  Got recent rxn for Vicodin from Dr. Ninfa Linden for hand pain post surgery for  trigger finger.  She tells me that some days she takes the tramadol if the pain is not severe.  If the pain gets severe she would take a Vicodin instead.  Denies any drowsiness with Tramadol.  Reports her last dose of tramadol was a few days ago.  Last dose of Vicodin was this morning.  Some constipation at times for which she takes something.  Patient Active Problem List   Diagnosis Date Noted   Shoulder stiffness, left 07/07/2020   Status post lumbar spine surgery for decompression of spinal cord 06/09/2020   Lumbar stenosis 05/27/2020   Spinal stenosis of lumbar region 05/21/2019   Complete tear of left rotator cuff 05/16/2019   Uncontrolled type 2 diabetes mellitus with hyperglycemia ([place]) 12/19/2018   Microalbuminuria 04/08/2018   Genetic testing 09/25/2017   Family history of breast cancer    Family history of prostate cancer    Malignant neoplasm of upper-outer quadrant of right breast in female, estrogen receptor positive ([place]) 06/26/2017   Lumbar herniated disc 04/15/2015   Abnormal nuclear stress test 04/03/2015   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   CAD (coronary artery disease), native coronary artery 03/30/2015   S/P mitral valve repair 11/16/2010   Dyslipidemia 06/29/2010   Diabetes ([place]) 09/29/2006   ANEMIA-IRON DEFICIENCY 09/29/2006   Essential  hypertension 09/29/2006     Current Outpatient Medications on File Prior to Visit  Medication Sig Dispense Refill   Accu-Chek Softclix Lancets lancets Use as instructed 100 each 12   albuterol (VENTOLIN HFA) 108 (90 Base) MCG/ACT inhaler Inhale 2 puffs into the lungs every 6 (six) hours as needed for wheezing or shortness of breath. 1 each 2   amLODipine (NORVASC) 10 MG tablet Take 1 tablet (10 mg total) by mouth daily. 90 tablet 3   Ascorbic Acid (VITAMIN C PO) Take 500 mg by mouth daily.     aspirin EC 81 MG tablet Take 1 tablet (81 mg total) by mouth daily. 30 tablet 5   atorvastatin (LIPITOR) 80 MG tablet Take 1  tablet (80 mg total) by mouth daily. 90 tablet 1   Blood Glucose Monitoring Suppl (ACCU-CHEK AVIVA PLUS) w/Device KIT Use as directed 1 kit 0   buPROPion (WELLBUTRIN XL) 150 MG 24 hr tablet Take 1 tablet (150 mg total) by mouth 2 (two) times daily. 180 tablet 2   carvedilol (COREG) 12.5 MG tablet Take 1 tablet (12.5 mg total) by mouth 2 (two) times daily. 180 tablet 3   diclofenac Sodium (VOLTAREN) 1 % GEL APPLY 2 GRAMS TOPICALLY 4 (FOUR) TIMES DAILY AS NEEDED. (Patient taking differently: Apply 2 g topically 4 (four) times daily as needed (pain). APPLY 2 GRAMS TOPICALLY 4 (FOUR) TIMES DAILY AS NEEDED.) 100 g 0   ferrous sulfate (FEROSUL) 325 (65 FE) MG tablet TAKE 1 TABLET (325 MG TOTAL) BY MOUTH DAILY WITH BREAKFAST. 90 tablet 0   gabapentin (NEURONTIN) 100 MG capsule Take one at night for a few days then increase to one twice a day. (Patient taking differently: Take 100 mg by mouth 2 (two) times daily as needed (pain).) 60 capsule 0   glipiZIDE (GLUCOTROL) 5 MG tablet TAKE 1 TABLET DAILY BEFORE BREAKFAST 90 tablet 0   glucose blood (ACCU-CHEK AVIVA PLUS) test strip Use as instructed 100 each 12   hydrochlorothiazide (HYDRODIURIL) 25 MG tablet Take 1 tablet (25 mg total) by mouth daily. 90 tablet 2   letrozole (FEMARA) 2.5 MG tablet TAKE 1 TABLET EVERY DAY 30 tablet 0   losartan (COZAAR) 100 MG tablet Take 1 tablet (100 mg total) by mouth daily. 90 tablet 3   Melatonin 10 MG TABS Take 1 tablet by mouth at bedtime.     methocarbamol (ROBAXIN) 500 MG tablet Take 1 tablet (500 mg total) by mouth every 8 (eight) hours as needed for muscle spasms. 60 tablet 0   pantoprazole (PROTONIX) 40 MG tablet TAKE 1 TABLET EVERY DAY 90 tablet 3   polyethylene glycol (MIRALAX / GLYCOLAX) 17 g packet Take 17 g by mouth daily as needed for mild constipation or moderate constipation. 30 each 5   traMADol (ULTRAM) 50 MG tablet Take 1 tablet (50 mg total) by mouth daily as needed. 30 tablet 1   traZODone (DESYREL) 50 MG  tablet TAKE 1 TABLET AT BEDTIME AS NEEDED FOR SLEEP 90 tablet 0   No current facility-administered medications on file prior to visit.    No Known Allergies  Social History   Socioeconomic History   Marital status: Married    Spouse name: Not on file   Number of children: 3   Years of education: Not on file   Highest education level: Not on file  Occupational History    Employer: UNEMPLOYED  Tobacco Use   Smoking status: Former    Pack years: 0.00  Types: Cigarettes    Quit date: 10/14/2010    Years since quitting: 10.2   Smokeless tobacco: Never  Vaping Use   Vaping Use: Never used  Substance and Sexual Activity   Alcohol use: Yes    Comment: social   Drug use: No   Sexual activity: Not Currently  Other Topics Concern   Not on file  Social History Narrative   Lives with husband, stay at home, uses cane occasionally, still active/ambulatory.    Social Determinants of Health   Financial Resource Strain: Not on file  Food Insecurity: Not on file  Transportation Needs: Not on file  Physical Activity: Not on file  Stress: Not on file  Social Connections: Not on file  Intimate Partner Violence: Not on file    Family History  Problem Relation Age of Onset   Colon cancer Mother        dx 64s; deceased 39   Irritable bowel syndrome Mother    Prostate cancer Father        deceased 5s   Cirrhosis Sister        died of GI bleed associated with cirrhosis of the liver   Stroke Brother    Prostate cancer Brother 18       deceased 36   Diabetes Brother        x3   Kidney disease Brother    Cancer Brother 60       unk. type   Breast cancer Cousin        several maternal 1st and 2nd cousins with breast cancer    Past Surgical History:  Procedure Laterality Date   BREAST BIOPSY Right 08/26/2019   benign   BREAST LUMPECTOMY Right    BREAST LUMPECTOMY WITH RADIOACTIVE SEED AND SENTINEL LYMPH NODE BIOPSY Right 07/20/2017   Procedure: RIGHT BREAST LUMPECTOMY WITH  RADIOACTIVE SEED AND SENTINEL LYMPH NODE BIOPSY;  Surgeon: Alphonsa Overall, MD;  Location: [facility];  Service: General;  Laterality: Right;   CARDIAC CATHETERIZATION  04/03/2015   Procedure: Left Heart Cath and Cors/Grafts Angiography;  Surgeon: Peter M Martinique, MD;  Location: [facility];  Service: Cardiovascular;;   CORONARY ARTERY BYPASS GRAFT  1/12   history of ankle fractures requiring surgery     LUMBAR LAMINECTOMY/DECOMPRESSION MICRODISCECTOMY Left 04/15/2015   Procedure: LUMBAR LAMINECTOMY/DECOMPRESSION MICRODISCECTOMY 1 LEVEL;  Surgeon: Newman Pies, MD;  Location: [facility];  Service: Neurosurgery;  Laterality: Left;  Left L23 microdiskectomy   LUMBAR LAMINECTOMY/DECOMPRESSION MICRODISCECTOMY N/A 05/27/2020   Procedure: L2-3, L3-4  decompression;  Surgeon: [name], MD;  Location: [facility];  Service: Orthopedics;  Laterality: N/A;   MV repair and resection of mass  1/12   RESECTION DISTAL CLAVICAL Left 05/16/2019   Procedure: RESECTION DISTAL CLAVICAL;  Surgeon: Mcarthur Rossetti, MD;  Location: [facility];  Service: Orthopedics;  Laterality: Left;   SHOULDER ARTHROSCOPY WITH ROTATOR CUFF REPAIR AND SUBACROMIAL DECOMPRESSION Left 05/16/2019   Procedure: SHOULDER ARTHROSCOPY WITH ROTATOR CUFF REPAIR AND SUBACROMIAL DECOMPRESSION;  Surgeon: Mcarthur Rossetti, MD;  Location: [facility];  Service: Orthopedics;  Laterality: Left;   TONSILLECTOMY  1971   TOTAL ABDOMINAL HYSTERECTOMY  1978   VESICOVAGINAL FISTULA CLOSURE W/ TAH      ROS: Review of Systems Negative except as stated above  PHYSICAL EXAM: BP (!) 158/68   Pulse (!) 58   Resp 16   Wt 231 lb 6.4 oz (105 kg)   SpO2 97%  BMI 36.24 kg/m   Wt Readings from Last 3 Encounters:  12/31/20 231 lb 6.4 oz (105 kg)  07/07/20 238 lb (108 kg)  06/23/20 238 lb (108 kg)    Physical Exam  General appearance - alert, well appearing, obese older African-American female and in no  distress Mental status - normal mood, behavior, speech, dress, motor activity, and thought processes Neck - supple, no significant adenopathy Chest - clear to auscultation, no wheezes, rales or rhonchi, symmetric air entry Heart - normal rate, regular rhythm, normal S1, S2, no murmurs, rubs, clicks or gallops Extremities - peripheral pulses normal, no pedal edema, no clubbing or cyanosis Diabetic Foot Exam - Simple   Simple Foot Form Visual Inspection See comments: Yes Sensation Testing Intact to touch and monofilament testing bilaterally: Yes Pulse Check Posterior Tibialis and Dorsalis pulse intact bilaterally: Yes Comments Small nonulcerative callus on the medial aspect of both big toes.      Opioid Risk Tool - 12/31/20 0920       Family History of Substance Abuse   Alcohol Positive Female   father and sister   Illegal Drugs Negative    Rx Drugs Negative      Personal History of Substance Abuse   Alcohol Negative    Illegal Drugs Negative    Rx Drugs Negative      Age   Age between 23-45 years  No      History of Preadolescent Sexual Abuse   History of Preadolescent Sexual Abuse Negative or Female      Psychological Disease   Psychological Disease Negative    Depression Positive      Total Score   Opioid Risk Tool Scoring 4    Opioid Risk Interpretation Moderate Risk              CMP Latest Ref Rng & Units 05/19/2020 05/15/2020 03/22/2020  Glucose 70 - 99 mg/dL 94 178(H) 142(H)  BUN 8 - 23 mg/dL 24(H) 9 21  Creatinine 0.44 - 1.00 mg/dL 1.67(H) 1.21(H) 1.45(H)  Sodium 135 - 145 mmol/L 139 141 137  Potassium 3.5 - 5.1 mmol/L 4.0 5.0 4.5  Chloride 98 - 111 mmol/L 104 102 103  CO2 22 - 32 mmol/L $RemoveB'23 22 24  'FrSRkQVq$ Calcium 8.9 - 10.3 mg/dL 9.3 10.0 8.9  Total Protein 6.5 - 8.1 g/dL 6.7 - 6.4(L)  Total Bilirubin 0.3 - 1.2 mg/dL 0.8 - 0.4  Alkaline Phos 38 - 126 U/L 54 - 52  AST 15 - 41 U/L 19 - 17  ALT 0 - 44 U/L 13 - 12   Lipid Panel     Component Value Date/Time    CHOL 206 (H) 01/20/2020 1434   TRIG 140 01/20/2020 1434   HDL 82 01/20/2020 1434   CHOLHDL 2.5 01/20/2020 1434   CHOLHDL 2.8 04/28/2016 1028   VLDL 19 04/28/2016 1028   LDLCALC 100 (H) 01/20/2020 1434   LDLDIRECT 101.7 07/18/2011 0901    CBC    Component Value Date/Time   WBC 6.6 05/19/2020 1449   RBC 4.67 05/19/2020 1449   HGB 12.2 05/19/2020 1449   HGB 10.9 (L) 03/16/2020 1655   HCT 39.3 05/19/2020 1449   HCT 34.1 03/16/2020 1655   PLT 299 05/19/2020 1449   PLT 279 03/16/2020 1655   MCV 84.2 05/19/2020 1449   MCV 81 03/16/2020 1655   MCH 26.1 05/19/2020 1449   MCHC 31.0 05/19/2020 1449   RDW 14.9 05/19/2020 1449   RDW 14.3 03/16/2020 1655  LYMPHSABS 2,541 04/28/2016 1028   MONOABS 385 04/28/2016 1028   EOSABS 77 04/28/2016 1028   BASOSABS 0 04/28/2016 1028    ASSESSMENT AND PLAN: 1. Type 2 diabetes mellitus with obesity (HCC) A1c is not at goal.  She has been out of metformin for almost a month.  Refill sent on metformin.  However if her GFR comes back in the range where metformin can no longer be used, other options would be Jardiance or Trulicity.  I went over with her how Trulicity works and possible side effects.  She thinks she would be more interested in the Trulicity to help with weight loss.  I had our clinical pharmacist see her today to teach her how to do the Trulicity injection in the event we have to stop or lower the dose of the metformin. Discussed and encourage healthy eating habits.  Commended her on moving more. - POCT glucose (manual entry) - POCT glycosylated hemoglobin (Hb A1C) - CBC - Comprehensive metabolic panel - Lipid panel - Microalbumin / creatinine urine ratio - metFORMIN (GLUCOPHAGE) 1000 MG tablet; Take 1 tablet (1,000 mg total) by mouth 2 (two) times daily with a meal.  Dispense: 180 tablet; Refill: 2  2. Hypertension associated with diabetes ([place]) Not at goal but patient has not taken medicines as yet for today.  No changes made.   DASH diet encouraged.  Advised to continue to check blood pressure with goal being 130/80 or lower.  We will recheck blood pressure on follow-up visit for Medicare wellness.  3. Hyperlipidemia associated with type 2 diabetes mellitus ([place]) Continue atorvastatin  4. Stage 3b chronic kidney disease ([place]) Plan to recheck kidney function today with blood test and urine microalbumin  5. CAD of autologous artery bypass graft without angina Stable.  Continue beta-blocker, aspirin and Lipitor  6. Chronic midline low back pain without sciatica -We did opiate risk assessment tool today.  She is considered to be at moderate risk.  However she is doing well on the tramadol and has had improved function over the past several months since her back surgery. -Strongly advised her against taking tramadol with Vicodin given that she has a current prescription for the latter from her orthopedics surgeon.  Went over possible side effects of narcotic medications and risks of dependence/addiction.  She is agreeable to controlled substance prescribing agreement. She is agreeable to urine drug screen today. [place] controlled substance reporting system reviewed. - [id number] 11+Oxyco+Alc+Crt-Bund  7. Controlled substance agreement signed   Patient was given the opportunity to ask questions.  Patient verbalized understanding of the plan and was able to repeat key elements of the plan.   Orders Placed This Encounter  Procedures   CBC   Comprehensive metabolic panel   Lipid panel   Microalbumin / creatinine urine ratio   [id number] 11+Oxyco+Alc+Crt-Bund   POCT glucose (manual entry)   POCT glycosylated hemoglobin (Hb A1C)     Requested Prescriptions   Signed Prescriptions Disp Refills   metFORMIN (GLUCOPHAGE) 1000 MG tablet 180 tablet 2    Sig: Take 1 tablet (1,000 mg total) by mouth 2 (two) times daily with a meal.    No follow-ups on file.  Karle Plumber, MD, FACP

## 2021-01-01 ENCOUNTER — Telehealth: Payer: Self-pay | Admitting: Internal Medicine

## 2021-01-01 LAB — COMPREHENSIVE METABOLIC PANEL
ALT: 19 IU/L (ref 0–32)
AST: 17 IU/L (ref 0–40)
Albumin/Globulin Ratio: 1.7 (ref 1.2–2.2)
Albumin: 4 g/dL (ref 3.7–4.7)
Alkaline Phosphatase: 70 IU/L (ref 44–121)
BUN/Creatinine Ratio: 12 (ref 12–28)
BUN: 14 mg/dL (ref 8–27)
Bilirubin Total: 0.4 mg/dL (ref 0.0–1.2)
CO2: 23 mmol/L (ref 20–29)
Calcium: 9.5 mg/dL (ref 8.7–10.3)
Chloride: 103 mmol/L (ref 96–106)
Creatinine, Ser: 1.21 mg/dL — ABNORMAL HIGH (ref 0.57–1.00)
Globulin, Total: 2.4 g/dL (ref 1.5–4.5)
Glucose: 175 mg/dL — ABNORMAL HIGH (ref 65–99)
Potassium: 4.5 mmol/L (ref 3.5–5.2)
Sodium: 142 mmol/L (ref 134–144)
Total Protein: 6.4 g/dL (ref 6.0–8.5)
eGFR: 48 mL/min/{1.73_m2} — ABNORMAL LOW (ref >59)

## 2021-01-01 LAB — CBC
Hematocrit: 37.4 % (ref 34.0–46.6)
Hemoglobin: 11.9 g/dL (ref 11.1–15.9)
MCH: 25.4 pg — ABNORMAL LOW (ref 26.6–33.0)
MCHC: 31.8 g/dL (ref 31.5–35.7)
MCV: 80 fL (ref 79–97)
Platelets: 267 10*3/uL (ref 150–450)
RBC: 4.69 x10E6/uL (ref 3.77–5.28)
RDW: 14.6 % (ref 11.7–15.4)
WBC: 6.1 10*3/uL (ref 3.4–10.8)

## 2021-01-01 LAB — LIPID PANEL
Chol/HDL Ratio: 2.8 ratio (ref 0.0–4.4)
Cholesterol, Total: 223 mg/dL — ABNORMAL HIGH (ref 100–199)
HDL: 81 mg/dL (ref >39)
LDL Chol Calc (NIH): 113 mg/dL — ABNORMAL HIGH (ref 0–99)
Triglycerides: 170 mg/dL — ABNORMAL HIGH (ref 0–149)
VLDL Cholesterol Cal: 29 mg/dL (ref 5–40)

## 2021-01-01 LAB — MICROALBUMIN / CREATININE URINE RATIO
Creatinine, Urine: 264.1 mg/dL
Microalb/Creat Ratio: 621 mg/g creat — ABNORMAL HIGH (ref 0–29)
Microalbumin, Urine: 1639.9 ug/mL

## 2021-01-01 NOTE — Telephone Encounter (Signed)
Phone call placed to patient today to discuss lab results.  Her voicemail box was full and I was unable to leave a message.  I will try her again next week.

## 2021-01-03 ENCOUNTER — Telehealth: Payer: Self-pay | Admitting: Internal Medicine

## 2021-01-03 DIAGNOSIS — E1169 Type 2 diabetes mellitus with other specified complication: Secondary | ICD-10-CM

## 2021-01-03 DIAGNOSIS — E785 Hyperlipidemia, unspecified: Secondary | ICD-10-CM

## 2021-01-03 DIAGNOSIS — I25810 Atherosclerosis of coronary artery bypass graft(s) without angina pectoris: Secondary | ICD-10-CM

## 2021-01-03 MED ORDER — TRULICITY 0.75 MG/0.5ML ~~LOC~~ SOAJ: 0.7500 mg | SUBCUTANEOUS | 5 refills | Status: DC

## 2021-01-03 NOTE — Telephone Encounter (Signed)
PC placed to pt today to discuss lab results.  LDL cholesterol is 113 with goal being less than 70.  Pt reports compliance with taking Atorvastatin 80 mg and has not been out of it recently.  I recommend getting her in with her cardiologist Dr. Radford Pax to see if she is a candidate for PCKS9 agent.  Pt agreeable to this.  She also requested referral to a nutritionist. Kidney function has improved with GFR of 48 but not 100%.  This is in the presence of her being out of Metformin for 1 mth. +macroalbumin of 621 increased from 300 1 yr ago.  She endorses compliance with Losartan. Blood count stable compared to when last checked 04/2020. Pt still interested on being placed on Trulicity.  She saw clinical pharmacist on recent visit and was taught how to administer.  I told pt we will start Trulicity.  Rxn [facility] to [facility].  Once she receives it, should should STOP GLUCOTROL and decrease Metformin to 1000 mg 1/2 tablet twice a day.  Pt expressed understanding and was able to repeat instructions back to me.  Results for orders placed or performed in visit on 12/31/20  CBC  Result Value Ref Range   WBC 6.1 3.4 - 10.8 x10E3/uL   RBC 4.69 3.77 - 5.28 x10E6/uL   Hemoglobin 11.9 11.1 - 15.9 g/dL   Hematocrit 37.4 34.0 - 46.6 %   MCV 80 79 - 97 fL   MCH 25.4 (L) 26.6 - 33.0 pg   MCHC 31.8 31.5 - 35.7 g/dL   RDW 14.6 11.7 - 15.4 %   Platelets 267 150 - 450 x10E3/uL  Comprehensive metabolic panel  Result Value Ref Range   Glucose 175 (H) 65 - 99 mg/dL   BUN 14 8 - 27 mg/dL   Creatinine, Ser 1.21 (H) 0.57 - 1.00 mg/dL   eGFR 48 (L) >59 mL/min/1.73   BUN/Creatinine Ratio 12 12 - 28   Sodium 142 134 - 144 mmol/L   Potassium 4.5 3.5 - 5.2 mmol/L   Chloride 103 96 - 106 mmol/L   CO2 23 20 - 29 mmol/L   Calcium 9.5 8.7 - 10.3 mg/dL   Total Protein 6.4 6.0 - 8.5 g/dL   Albumin 4.0 3.7 - 4.7 g/dL   Globulin, Total 2.4 1.5 - 4.5 g/dL   Albumin/Globulin Ratio 1.7 1.2 - 2.2   Bilirubin Total 0.4 0.0 - 1.2 mg/dL    Alkaline Phosphatase 70 44 - 121 IU/L   AST 17 0 - 40 IU/L   ALT 19 0 - 32 IU/L  Lipid panel  Result Value Ref Range   Cholesterol, Total 223 (H) 100 - 199 mg/dL   Triglycerides 170 (H) 0 - 149 mg/dL   HDL 81 >39 mg/dL   VLDL Cholesterol Cal 29 5 - 40 mg/dL   LDL Chol Calc (NIH) 113 (H) 0 - 99 mg/dL   Chol/HDL Ratio 2.8 0.0 - 4.4 ratio  Microalbumin / creatinine urine ratio  Result Value Ref Range   Creatinine, Urine 264.1 Not Estab. mg/dL   Microalbumin, Urine 1,639.9 Not Estab. ug/mL   Microalb/Creat Ratio 621 (H) 0 - 29 mg/g creat  POCT glucose (manual entry)  Result Value Ref Range   POC Glucose 177 (A) 70 - 99 mg/dl  POCT glycosylated hemoglobin (Hb A1C)  Result Value Ref Range   Hemoglobin A1C     HbA1c POC (<> result, manual entry)     HbA1c, POC (prediabetic range)     HbA1c,  POC (controlled diabetic range) 8.8 (A) 0.0 - 7.0 %

## 2021-01-06 ENCOUNTER — Telehealth: Payer: Self-pay | Admitting: Internal Medicine

## 2021-01-06 LAB — DRUG SCREEN 764883 11+OXYCO+ALC+CRT-BUND
Amphetamines, Urine: NEGATIVE ng/mL
BENZODIAZ UR QL: NEGATIVE ng/mL
Barbiturate: NEGATIVE ng/mL
Cannabinoid Quant, Ur: NEGATIVE ng/mL
Creatinine: 267.1 mg/dL (ref 20.0–300.0)
Ethanol: NEGATIVE %
Meperidine: NEGATIVE ng/mL
Methadone Screen, Urine: NEGATIVE ng/mL
Oxycodone/Oxymorphone, Urine: NEGATIVE ng/mL
Phencyclidine: NEGATIVE ng/mL
Propoxyphene: NEGATIVE ng/mL
pH, Urine: 5.1 (ref 4.5–8.9)

## 2021-01-06 LAB — OPIATES CONFIRMATION, URINE
Codeine: NEGATIVE
Hydrocodone Confirm: 1026 ng/mL
Hydrocodone: POSITIVE — AB
Hydromorphone: NEGATIVE
Morphine: NEGATIVE
Opiates: POSITIVE ng/mL — AB

## 2021-01-06 LAB — TRAMADOL GC/MS, URINE: Tramadol: NEGATIVE

## 2021-01-06 LAB — COCAINE CONF, UR
Benzoylecgonine GC/MS Conf: 173760 ng/mL
Cocaine Metab Quant, Ur: POSITIVE — AB

## 2021-01-06 NOTE — Telephone Encounter (Signed)
Phone call placed to patient this a.m. to go over the results of urine drug screen.  Patient informed that the urine drug screen was positive for hydrocodone which she told me she had been taking and which was provided to her by her orthopedics.  It was negative for tramadol which she told me that she hold off on taking the tramadol while she was on the hydrocodone.  The urine drug screen was positive for cocaine metabolites also.  Patient informed me that she does not use cocaine but that she smoked some weed over 4th July weekend that may have been laced with cocaine.  She denies that this was an ongoing substance use for her.  I did make her aware that the urine drug screen was negative for cannabis/weed.  I informed her that cocaine use is not good for her overall health given her medical conditions.  I strongly encouraged her to discontinue using.  I inquired whether she needs me to get her into a treatment program and patient states that this was not necessary.  I informed her of NA meetings in the [place] area should she decide to seek some help.  I told her that we will need to hold off on further refills on tramadol.  However if pain becomes a major issue for her, we can refer her to a pain specialist.  Patient expressed understanding of the plan and all questions were answered.

## 2021-01-19 ENCOUNTER — Encounter: Payer: Medicare PPO | Admitting: Pharmacist

## 2021-02-23 ENCOUNTER — Ambulatory Visit: Payer: Medicare PPO | Admitting: Dietician

## 2021-03-18 ENCOUNTER — Encounter: Payer: Medicare PPO | Admitting: Internal Medicine

## 2021-03-29 ENCOUNTER — Other Ambulatory Visit: Payer: Self-pay

## 2021-03-29 ENCOUNTER — Ambulatory Visit: Payer: Medicare PPO | Attending: Internal Medicine | Admitting: Internal Medicine

## 2021-03-29 VITALS — BP 165/84 | HR 79 | Resp 16 | Ht 67.0 in | Wt 236.4 lb

## 2021-03-29 DIAGNOSIS — N3941 Urge incontinence: Secondary | ICD-10-CM

## 2021-03-29 DIAGNOSIS — Z23 Encounter for immunization: Secondary | ICD-10-CM

## 2021-03-29 DIAGNOSIS — E1159 Type 2 diabetes mellitus with other circulatory complications: Secondary | ICD-10-CM

## 2021-03-29 DIAGNOSIS — Z1211 Encounter for screening for malignant neoplasm of colon: Secondary | ICD-10-CM

## 2021-03-29 DIAGNOSIS — M5440 Lumbago with sciatica, unspecified side: Secondary | ICD-10-CM | POA: Diagnosis not present

## 2021-03-29 DIAGNOSIS — R200 Anesthesia of skin: Secondary | ICD-10-CM | POA: Diagnosis not present

## 2021-03-29 DIAGNOSIS — E669 Obesity, unspecified: Secondary | ICD-10-CM

## 2021-03-29 DIAGNOSIS — Z6837 Body mass index (BMI) 37.0-37.9, adult: Secondary | ICD-10-CM

## 2021-03-29 DIAGNOSIS — Z0001 Encounter for general adult medical examination with abnormal findings: Secondary | ICD-10-CM | POA: Diagnosis not present

## 2021-03-29 DIAGNOSIS — Z7189 Other specified counseling: Secondary | ICD-10-CM

## 2021-03-29 DIAGNOSIS — E119 Type 2 diabetes mellitus without complications: Secondary | ICD-10-CM

## 2021-03-29 DIAGNOSIS — I152 Hypertension secondary to endocrine disorders: Secondary | ICD-10-CM | POA: Diagnosis not present

## 2021-03-29 DIAGNOSIS — G8929 Other chronic pain: Secondary | ICD-10-CM

## 2021-03-29 DIAGNOSIS — E1169 Type 2 diabetes mellitus with other specified complication: Secondary | ICD-10-CM

## 2021-03-29 DIAGNOSIS — Z0000 Encounter for general adult medical examination without abnormal findings: Secondary | ICD-10-CM

## 2021-03-29 MED ORDER — GLIPIZIDE 5 MG PO TABS: 5.0000 mg | ORAL_TABLET | Freq: Every day | ORAL | 3 refills | Status: DC

## 2021-03-29 MED ORDER — EMPAGLIFLOZIN 10 MG PO TABS
10.0000 mg | ORAL_TABLET | Freq: Every day | ORAL | 4 refills | Status: DC
Start: 2021-03-29 — End: 2021-04-21

## 2021-03-29 NOTE — Progress Notes (Signed)
Subjective:   [name] is a 70 y.o. female who presents for Medicare Annual (Subsequent) preventive examination. Pt with hx of HTN, DM, HL, depression, chronic back pain, RT breast CA (intraductal CA, ER/PR+, lumpectomy and XRT - completed 08/2017), CKD stage 3, CVA 2011 (residual weakness on LT side), CAD s/p CABG and MVR/resection of fibroelastoma, IDA.   Review of Systems    Neuro: Complains of some numbness along the medial aspect of the right fifth finger times a few months. CVS: Blood pressure noted to be elevated today.  She has not taken her medicines as yet for the morning.  Limits salt in the foods.  No chest pains or shortness of breath.       Objective:    Today's Vitals   03/29/21 0956  BP: (!) 165/84  Pulse: 79  Resp: 16  SpO2: 100%  Weight: 236 lb 6.4 oz (107.2 kg)  Height: _0  (1.702 m)  PainSc: 5    Body mass index is 37.03 kg/m. Wt Readings from Last 3 Encounters:  03/29/21 236 lb 6.4 oz (107.2 kg)  12/31/20 231 lb 6.4 oz (105 kg)  07/07/20 238 lb (108 kg)   Gen: Elderly African-American female in NAD. Neck: No thyroid enlargement.  No cervical lymphadenopathy. Chest: Clear to auscultation bilaterally. CVS: Regular rate and rhythm without gallops or murmurs. Extremities: No lower extremity edema. MSK: Grip 5/5 bilaterally.  No wasting of intrinsic muscles of the hands.  Subjectively decreased sensation along the medial aspect of the right small finger.  Advanced Directives 03/29/2021 07/09/2020 05/28/2020 05/27/2020 05/19/2020 01/20/2020 06/24/2019  Does Patient Have a Medical Advance Directive? _1  No Yes  Type of Advance Directive - - - - - - [place]  Would patient like information on creating a medical advance directive? Yes (Inpatient - patient defers creating a medical advance directive at this time - Information given) No - Patient declined No - Patient declined No - Patient declined No - Patient declined No  - Patient declined -  Pre-existing out of facility DNR order (yellow form or pink MOST form) - - - - - - -  Patient given packet on advanced directive.  I discussed with her what is an advanced directive, living will and healthcare power of attorney.  She plans to read the material over  Current Medications (verified) Outpatient Encounter Medications as of 03/29/2021  Medication Sig   Accu-Chek Softclix Lancets lancets Use as instructed   albuterol (VENTOLIN HFA) 108 (90 Base) MCG/ACT inhaler Inhale 2 puffs into the lungs every 6 (six) hours as needed for wheezing or shortness of breath.   amLODipine (NORVASC) 10 MG tablet Take 1 tablet (10 mg total) by mouth daily.   Ascorbic Acid (VITAMIN C PO) Take 500 mg by mouth daily.   aspirin EC 81 MG tablet Take 1 tablet (81 mg total) by mouth daily.   atorvastatin (LIPITOR) 80 MG tablet Take 1 tablet (80 mg total) by mouth daily.   Blood Glucose Monitoring Suppl (ACCU-CHEK AVIVA PLUS) w/Device KIT Use as directed   buPROPion (WELLBUTRIN XL) 150 MG 24 hr tablet Take 1 tablet (150 mg total) by mouth 2 (two) times daily.   diclofenac Sodium (VOLTAREN) 1 % GEL APPLY 2 GRAMS TOPICALLY 4 (FOUR) TIMES DAILY AS NEEDED. (Patient taking differently: Apply 2 g topically 4 (four) times daily as needed (pain). APPLY 2 GRAMS TOPICALLY 4 (FOUR) TIMES DAILY AS NEEDED.)   ferrous sulfate (FEROSUL)  325 (65 FE) MG tablet TAKE 1 TABLET (325 MG TOTAL) BY MOUTH DAILY WITH BREAKFAST.   hydrochlorothiazide (HYDRODIURIL) 25 MG tablet Take 1 tablet (25 mg total) by mouth daily.   letrozole (FEMARA) 2.5 MG tablet TAKE 1 TABLET EVERY DAY   losartan (COZAAR) 100 MG tablet Take 1 tablet (100 mg total) by mouth daily.   Melatonin 10 MG TABS Take 1 tablet by mouth at bedtime.   metFORMIN (GLUCOPHAGE) 1000 MG tablet Take 1 tablet (1,000 mg total) by mouth 2 (two) times daily with a meal.   methocarbamol (ROBAXIN) 500 MG tablet Take 1 tablet (500 mg total) by mouth every 8 (eight)  hours as needed for muscle spasms.   pantoprazole (PROTONIX) 40 MG tablet TAKE 1 TABLET EVERY DAY   traZODone (DESYREL) 50 MG tablet TAKE 1 TABLET AT BEDTIME AS NEEDED FOR SLEEP   carvedilol (COREG) 12.5 MG tablet Take 1 tablet (12.5 mg total) by mouth 2 (two) times daily.   Dulaglutide (TRULICITY) 0.07 HQ/1.9XJ SOPN Inject 0.75 mg into the skin once a week. (Patient not taking: Reported on 03/29/2021)   gabapentin (NEURONTIN) 100 MG capsule Take one at night for a few days then increase to one twice a day. (Patient not taking: Reported on 03/29/2021)   glipiZIDE (GLUCOTROL) 5 MG tablet TAKE 1 TABLET DAILY BEFORE BREAKFAST (Patient not taking: Reported on 03/29/2021)   glucose blood (ACCU-CHEK AVIVA PLUS) test strip Use as instructed   polyethylene glycol (MIRALAX / GLYCOLAX) 17 g packet Take 17 g by mouth daily as needed for mild constipation or moderate constipation. (Patient not taking: Reported on 03/29/2021)   traMADol (ULTRAM) 50 MG tablet Take 1 tablet (50 mg total) by mouth daily as needed. (Patient not taking: Reported on 03/29/2021)   No facility-administered encounter medications on file as of 03/29/2021.    Allergies (verified) Patient has no known allergies.   History: Past Medical History:  Diagnosis Date   Abscess in epidural space of L2-L5 lumbar spine 03/2006   Anemia, iron deficiency    Arthritis    Breast cancer ([place]) 06/12/2017   right breast   CAD (coronary artery disease), native coronary artery 03/30/2015   Cataract    Colon polyp    a. Multiple colonic polyps status post colonoscopy in October 2007, consistent with tubular adenoma, tubulovillous adenoma with no high-grade dysplasia or malignancy identified.    Constipation    Coronary artery disease    a. s/p CABG 06/2010: S-D2; S-PDA (at time of MV surgery).   Depression    Diabetes mellitus    type II   Family history of breast cancer    Family history of prostate cancer    Genetic testing 09/25/2017    Multi-Cancer panel (83 genes) @ [facility] - No pathogenic mutations detected   GERD (gastroesophageal reflux disease)    Hx of transient ischemic attack (TIA)    a. See stroke section.   Hyperlipidemia    Hypertension    Hypoxia    a. Has history of acute hypoxic respiratory failure in the setting of bronchitis/PNA or prior admissions.   MRSA infection    a. History of recurrent skin infection and soft tissue abscesses, with MRSA positive in the past.   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 mitral valve - s/p resection and MV repair 06/2010 Dr. [name].   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 Distal R MCA.   Stroke [facility])    a. 11/2009: mitral mass diagnosed at this time, also has distal R MCA stenosis, tx with coumadin. b. Readmitted 05/2010 with TIA symptoms - had not been taking Coumadin. s/p MV surgery 06/2010. Coumadin stopped 2013 after review of chart by Dr. [name] since mass was removed (stroke felt possibly related to this).    Past Surgical History:  Procedure Laterality Date   BREAST BIOPSY Right 08/26/2019   benign   BREAST LUMPECTOMY Right    BREAST LUMPECTOMY WITH RADIOACTIVE SEED AND SENTINEL LYMPH NODE BIOPSY Right 07/20/2017   Procedure: RIGHT BREAST LUMPECTOMY WITH RADIOACTIVE SEED AND SENTINEL LYMPH NODE BIOPSY;  Surgeon: Alphonsa Overall, MD;  Location: [place];  Service: General;  Laterality: Right;   CARDIAC CATHETERIZATION  04/03/2015   Procedure: Left Heart Cath and Cors/Grafts Angiography;  Surgeon: Peter M Martinique, MD;  Location: [place] CV LAB;  Service: Cardiovascular;;   CORONARY ARTERY BYPASS GRAFT  1/12   history of ankle fractures requiring surgery     LUMBAR LAMINECTOMY/DECOMPRESSION MICRODISCECTOMY Left 04/15/2015   Procedure: LUMBAR LAMINECTOMY/DECOMPRESSION MICRODISCECTOMY 1 LEVEL;  Surgeon: Newman Pies, MD;  Location: [place]  NEURO ORS;  Service: Neurosurgery;  Laterality: Left;  Left L23 microdiskectomy   LUMBAR LAMINECTOMY/DECOMPRESSION MICRODISCECTOMY N/A 05/27/2020   Procedure: L2-3, L3-4  decompression;  Surgeon: [name], MD;  Location: [place];  Service: Orthopedics;  Laterality: N/A;   MV repair and resection of mass  1/12   RESECTION DISTAL CLAVICAL Left 05/16/2019   Procedure: RESECTION DISTAL CLAVICAL;  Surgeon: Mcarthur Rossetti, MD;  Location: [place];  Service: Orthopedics;  Laterality: Left;   SHOULDER ARTHROSCOPY WITH ROTATOR CUFF REPAIR AND SUBACROMIAL DECOMPRESSION Left 05/16/2019   Procedure: SHOULDER ARTHROSCOPY WITH ROTATOR CUFF REPAIR AND SUBACROMIAL DECOMPRESSION;  Surgeon: Mcarthur Rossetti, MD;  Location: [place];  Service: Orthopedics;  Laterality: Left;   TONSILLECTOMY  1971   TOTAL ABDOMINAL HYSTERECTOMY  1978   VESICOVAGINAL FISTULA CLOSURE W/ TAH     Family History  Problem Relation Age of Onset   Colon cancer Mother        dx 26s; deceased 63   Irritable bowel syndrome Mother    Prostate cancer Father        deceased 30s   Cirrhosis Sister        died of GI bleed associated with cirrhosis of the liver   Stroke Brother    Prostate cancer Brother 50       deceased 38   Diabetes Brother        x3   Kidney disease Brother    Cancer Brother 17       unk. type   Breast cancer Cousin        several maternal 1st and 2nd cousins with breast cancer   Social History   Socioeconomic History   Marital status: Married    Spouse name: Not on file   Number of children: 3   Years of education: Not on file   Highest education level: Not on file  Occupational History    Employer: UNEMPLOYED  Tobacco Use   Smoking status: Former    Types: Cigarettes    Quit date: 10/14/2010    Years since quitting: 10.4   Smokeless tobacco: Never  Vaping Use   Vaping Use: Never used  Substance and Sexual Activity   Alcohol  use: Yes    Comment:  social   Drug use: No   Sexual activity: Not Currently  Other Topics Concern   Not on file  Social History Narrative   Lives with husband, stay at home, uses cane occasionally, still active/ambulatory.    Social Determinants of Health   Financial Resource Strain: Not on file  Food Insecurity: Not on file  Transportation Needs: Not on file  Physical Activity: Not on file  Stress: Not on file  Social Connections: Not on file    Tobacco Counseling Patient does not smoke.  Clinical Intake:  Pain : 0-10 Pain Score: 5  Pain Location: Back Pain Orientation: Lower    Diabetes: Yes CBG done?: No    Diabetic?yes Checking BS daily in mornings.  This a.m was 179.  Usually in the 120s.  Never got Trulicity due to cost which she states was $300 for 3 mth supply with her insurance.  Plan was for her to stop Glipizide and decrease Metformin to 500 mg BID if she was able to get the Trulicity.  Ran out of Glipizide for few mths. Still on Metformin.  Eating habits good.  Moving more.  Has a lot of gas/bloating post meals.  Also gets heart burn.  Can not drink a lot of OJ. On Pantoprazole  Activities of Daily Living In your present state of health, do you have any difficulty performing the following activities: 03/29/2021 05/28/2020  Hearing? N N  Vision? N N  Difficulty concentrating or making decisions? Y N  Walking or climbing stairs? Y Y  Dressing or bathing? Tempie Donning  Comment pt states difficulty bathing -  Doing errands, shopping? N N  Preparing Food and eating ? Y -  Comment preparing food -  Using the Toilet? N -  In the past six months, have you accidently leaked urine? Y -  Do you have problems with loss of bowel control? Y -  Managing your Medications? N -  Managing your Finances? N -  Housekeeping or managing your Housekeeping? Y -  Some recent data might be hidden  For the most part she walks without cane.  Uses it when having to walk longer distance. Has learned to lift feet  well when walking Has incontinence of urine.  Not able to hold urine until she gets to commode.  No incontinence with coughing, sneezing or laughing.  No stool incontinence. Denies problems dressing self. When she takes a shower, she always was someone with her.  Needs Shower chair No problems with decision making. Can cook but can not stand long.  She sits on a stool.  Hard to wash dishes without sitting  Patient Care Team: [name], MD as PCP - General (Internal Medicine) Sueanne Margarita, MD as PCP - Cardiology (Cardiology) Nicholas Lose, MD as Consulting Physician (Hematology and Oncology) Kyung Rudd, MD as Consulting Physician (Radiation Oncology) Delice Bison Charlestine Massed, NP as Nurse Practitioner (Hematology and Oncology) Alphonsa Overall, MD as Consulting Physician (General Surgery)  Indicate any recent Medical Services you may have received from other than [facility] providers in the past year (date may be approximate).     Assessment:   This is a routine wellness examination for [name].  Hearing/Vision screen Vision Screening   Right eye Left eye Both eyes  Without correction 20 40 20 40 20 40  With correction     Over due for eye exam.  Dr. Sherril Croon office called but she was out of town.  Would like to reschedule.  Also missed nutrition appt  Dietary issues and exercise activities discussed: See comments above   Goals Addressed   None   Depression Screen PHQ 2/9 Scores 03/29/2021 12/31/2020 10/02/2020 05/15/2020 01/20/2020 07/29/2019 03/08/2019  PHQ - 2 Score 2 1 0 1 0 0 2  PHQ- 9 Score 7 - 0 - - - 8   -on [place] which she finds helpful  Fall Risk Fall Risk  03/29/2021 10/02/2020 01/20/2020 03/08/2019 12/19/2018  Falls in the past year? 0 1 0 0 0  Number falls in past yr: 0 0 0 0 -  Injury with Fall? 0 1 0 0 -  Risk for fall due to : No Fall Risks - Impaired balance/gait;History of fall(s);Impaired mobility - -  Follow up - - Falls evaluation completed;Education  provided;Falls prevention discussed Falls evaluation completed -    FALL RISK PREVENTION PERTAINING TO THE HOME:  Any stairs in or around the home? Yes  If so, are there any without handrails? Yes  Home free of loose throw rugs in walkways, pet beds, electrical cords, etc? Yes  Adequate lighting in your home to reduce risk of falls? Yes    ASSISTIVE DEVICES UTILIZED TO PREVENT FALLS:  Life alert? No No. Feels she needs one Use of a cane, walker or w/c? Yes  sometimes uses cane Grab bars in the bathroom? No  Shower chair or bench in shower? No like one Elevated toilet seat or a handicapped toilet? Yes   TIMED UP AND GO:  Was the test performed? Yes .  Length of time to ambulate 10 feet: 10 sec.   Gait slow and steady without use of assistive device  Cognitive Function: MMSE - Mini Mental State Exam 03/29/2021 01/20/2020  Orientation to time 5 5  Orientation to Place 5 5  Registration 3 3  Attention/ Calculation 0 5  Recall 3 2  Language- name 2 objects 2 2  Language- repeat 1 1  Language- follow 3 step command 3 3  Language- read & follow direction 1 1  Write a sentence 0 1  Copy design 0 1  Total score 23 29        Immunizations Immunization History  Administered Date(s) Administered   Influenza,inj,Quad PF,6+ Mos 06/26/2014, 03/26/2015, 04/24/2017, 04/06/2018, 04/19/2019, 03/16/2020   PFIZER(Purple Top)SARS-COV-2 Vaccination 07/16/2019, 08/06/2019   Pneumococcal Conjugate-13 04/24/2017   Pneumococcal Polysaccharide-23 08/10/2018   Tdap 03/06/2014   Zoster Recombinat (Shingrix) 01/20/2020, 03/23/2020    TDAP status: Up to date  Flu Vaccine status: Completed at today's visit  Pneumococcal vaccine status: Up to date  Covid-19 vaccine status: Information provided on how to obtain vaccines.  Needs booster shot  Qualifies for Shingles Vaccine?  completed   Zostavax completed No   Shingrix Completed?: Yes  Screening Tests Health Maintenance  Topic Date Due    OPHTHALMOLOGY EXAM  08/30/2019   COVID-19 Vaccine (3 - Pfizer risk series) 09/03/2019   INFLUENZA VACCINE  01/25/2021   HEMOGLOBIN A1C  07/03/2021   COLONOSCOPY (Pts 45-32yrs Insurance coverage will need to be confirmed)  11/24/2021   FOOT EXAM  12/31/2021   MAMMOGRAM  09/16/2022   TETANUS/TDAP  03/06/2024   DEXA SCAN  Completed   Hepatitis C Screening  Completed   Zoster Vaccines- Shingrix  Completed   HPV VACCINES  Aged Out    Health Maintenance  Health Maintenance Due  Topic Date Due   OPHTHALMOLOGY EXAM  08/30/2019   COVID-19 Vaccine (3 - Pfizer risk  series) 09/03/2019   INFLUENZA VACCINE  01/25/2021    Colorectal cancer screening: Referral to GI placed Dr. Hilarie Fredrickson. Pt aware the office will call re: appt.  Mammogram status: Completed 08/2020. Repeat every year  Bone Density status: Completed 2019. Results reflect: Bone density results: OSTEOPENIA. Repeat every 3 years.  Lung Cancer Screening: (Low Dose CT Chest recommended if Age 73-80 years, 30 pack-year currently smoking OR have quit w/in 15years.) does not qualify.   Lung Cancer Screening Referral: NA  Additional Screening:  Hepatitis C Screening: does qualify; Completed 04/2016  Vision Screening: Recommended annual ophthalmology exams for early detection of glaucoma and other disorders of the eye. Is the patient up to date with their annual eye exam?  No  Who is the provider or what is the name of the office in which the patient attends annual eye exams? Dr. Schuyler Amor If pt is not established with a provider, would they like to be referred to a provider to establish care?  NA .   Dental Screening: Recommended annual dental exams for proper oral hygiene.  Does not have dentist.  Recommended routine dental exams twice a year  Community Resource Referral / Chronic Care Management: CRR required this visit?  No   CCM required this visit?  No      Plan:   1. Encounter for Medicare annual wellness exam   2.  Advance directive discussed with patient Martin Majestic over advanced directive as documented above.  Advised patient to give [place] a copy should she decide to execute a living will or healthcare power of attorney. -Advised to call life alert to inquire the cost of getting a life alert and see whether it is covered by her insurance.  3. Type 2 diabetes mellitus with obesity ([place]) Dietary counseling given. She was unable to afford Trulicity even with insurance so we have taken that off her med list.  She is willing to try the [place].  She will continue metformin and glipizide. - empagliflozin (JARDIANCE) 10 MG TABS tablet; Take 1 tablet (10 mg total) by mouth daily before breakfast.  Dispense: 30 tablet; Refill: 4 - Ambulatory referral to Ophthalmology - Amb ref to Medical Nutrition Therapy-MNT - Basic Metabolic Panel; Future - glipiZIDE (GLUCOTROL) 5 MG tablet; Take 1 tablet (5 mg total) by mouth daily before breakfast.  Dispense: 90 tablet; Refill: 3  4. Hypertension associated with diabetes ([place]) Not at goal.  She has not taken her medicines as yet for the morning.  Strongly advised that she takes her medicines every day around about the same time.  5. Urge incontinence of urine Discussed diagnosis of urge incontinence.  She is agreeable for prescription for incontinence supplies. - For home use only DME Other see comment  6. Chronic low back pain with sciatica, sciatica laterality unspecified, unspecified back pain laterality Prescription given for her to get a shower chair. - For home use only DME Other see comment  7. Finger numbness We will observe this for now.  8. Screening for colon cancer Discussed colon cancer screening methods.  She prefers - Ambulatory referral to Gastroenterology  9. Need for immunization against influenza - Flu Vaccine QUAD 47mo+IM (Fluarix, Fluzone & Alfiuria Quad PF)   I have personally reviewed and noted the following in the patient's chart:   Medical and  social history Use of alcohol, tobacco or illicit drugs  Current medications and supplements including opioid prescriptions.  Functional ability and status Nutritional status Physical activity Advanced directives List of other physicians  Hospitalizations, surgeries, and ER visits in previous 12 months Vitals Screenings to include cognitive, depression, and falls Referrals and appointments  In addition, I have reviewed and discussed with patient certain preventive protocols, quality metrics, and best practice recommendations. A written personalized care plan for preventive services as well as general preventive health recommendations were provided to patient.     Karle Plumber, MD   03/29/2021

## 2021-03-29 NOTE — Patient Instructions (Addendum)
If you execute an advanced directive, please bring a copy for our records. I have sent refill on glipizide to your pharmacy.  We have added a new diabetic medication called Jardiance.  Once you have been on this medication for 1 week, please return to the lab to have blood test done to check your kidney function.  I have resubmitted referral for your eye exam, for you to see the nutritionist and to have your colonoscopy done.

## 2021-04-05 ENCOUNTER — Other Ambulatory Visit: Payer: Self-pay

## 2021-04-05 ENCOUNTER — Ambulatory Visit: Payer: Medicare PPO | Attending: Internal Medicine

## 2021-04-05 DIAGNOSIS — E1159 Type 2 diabetes mellitus with other circulatory complications: Secondary | ICD-10-CM | POA: Diagnosis not present

## 2021-04-05 DIAGNOSIS — E1169 Type 2 diabetes mellitus with other specified complication: Secondary | ICD-10-CM | POA: Diagnosis not present

## 2021-04-05 DIAGNOSIS — E669 Obesity, unspecified: Secondary | ICD-10-CM | POA: Diagnosis not present

## 2021-04-06 LAB — BASIC METABOLIC PANEL
BUN/Creatinine Ratio: 10 — ABNORMAL LOW (ref 12–28)
BUN: 21 mg/dL (ref 8–27)
CO2: 25 mmol/L (ref 20–29)
Calcium: 9.4 mg/dL (ref 8.7–10.3)
Chloride: 99 mmol/L (ref 96–106)
Creatinine, Ser: 2.15 mg/dL — ABNORMAL HIGH (ref 0.57–1.00)
Glucose: 186 mg/dL — ABNORMAL HIGH (ref 70–99)
Potassium: 4.9 mmol/L (ref 3.5–5.2)
Sodium: 137 mmol/L (ref 134–144)
eGFR: 24 mL/min/{1.73_m2} — ABNORMAL LOW (ref >59)

## 2021-04-06 LAB — HEMOGLOBIN A1C
Est. average glucose Bld gHb Est-mCnc: 240 mg/dL
Hgb A1c MFr Bld: 10 % — ABNORMAL HIGH (ref 4.8–5.6)

## 2021-04-07 ENCOUNTER — Telehealth: Payer: Self-pay | Admitting: Internal Medicine

## 2021-04-07 ENCOUNTER — Other Ambulatory Visit: Payer: Self-pay

## 2021-04-07 ENCOUNTER — Ambulatory Visit: Payer: Medicare PPO | Attending: Family Medicine | Admitting: Pharmacist

## 2021-04-07 DIAGNOSIS — E1169 Type 2 diabetes mellitus with other specified complication: Secondary | ICD-10-CM

## 2021-04-07 DIAGNOSIS — E669 Obesity, unspecified: Secondary | ICD-10-CM

## 2021-04-07 DIAGNOSIS — Z79899 Other long term (current) drug therapy: Secondary | ICD-10-CM | POA: Diagnosis not present

## 2021-04-07 DIAGNOSIS — N184 Chronic kidney disease, stage 4 (severe): Secondary | ICD-10-CM

## 2021-04-07 MED ORDER — INSULIN GLARGINE 100 UNIT/ML SOLOSTAR PEN: 10.0000 [IU] | PEN_INJECTOR | Freq: Every day | SUBCUTANEOUS | 0 refills | Status: DC

## 2021-04-07 MED ORDER — NOVOLOG FLEXPEN 100 UNIT/ML ~~LOC~~ SOPN: PEN_INJECTOR | SUBCUTANEOUS | 11 refills | Status: DC

## 2021-04-07 MED ORDER — PEN NEEDLES 31G X 8 MM MISC: 6 refills | Status: DC

## 2021-04-07 MED ORDER — INSULIN GLARGINE 100 UNIT/ML SOLOSTAR PEN: 10.0000 [IU] | PEN_INJECTOR | Freq: Every day | SUBCUTANEOUS | 11 refills | Status: DC

## 2021-04-07 MED ORDER — NOVOLOG FLEXPEN 100 UNIT/ML ~~LOC~~ SOPN: PEN_INJECTOR | SUBCUTANEOUS | 0 refills | Status: DC

## 2021-04-07 NOTE — Telephone Encounter (Signed)
I spoke with patient.  She states that the cost for 1 pen of glargine and generic NovoLog +50 pen needles was $118.  She will check with her insurance [employer] tomorrow to find out whether it would be cheaper for her to get it through mail order.  In the meantime we agreed that I would send prescriptions for all 3 items to [facility] mail order for her.  She will let me know if it still remains cost prohibitive.

## 2021-04-07 NOTE — Telephone Encounter (Signed)
Pt called back to report that she needs a cheaper alternative, the current Rx is not affordable for her

## 2021-04-07 NOTE — Telephone Encounter (Signed)
Phone call placed to patient last evening to inform her that her kidney function had significantly declined compared to when it was last checked in July of this year.  GFR went from 48 now to 24 and creatinine of 2.15.  On last visit, we had added Jardiance to metformin and glipizide.  However patient states she was unable to afford the Jardiance. -Advised patient to stop metformin.   We had tried to prescribe Trulicity for her several months ago but her co-pay was too high. -I recommend starting glargine insulin 10 units at bedtime and NovoLog insulin 4 units with the 2 largest meals of the day which she states are usually breakfast and lunch.  Both pens are on preferred list for her insurance.  If it turns out, her co-pay is too high, then we will have to use NPH and regular insulin viles. -We will try to get her in with the clinical pharmacist as soon as possible for insulin administration teaching. -Once she starts insulin, she should stop the glipizide also. -Advised to check blood sugars at least twice a day before breakfast and before dinner and bring in the readings in about 2 to 3 weeks to meet with the clinical pharmacist again. -Advised that with the short acting insulin, when she takes the 4 units, she needs to eat within 3 to 5 minutes to prevent blood sugars from bottoming out. -After she has been off metformin for 2 to 3 weeks, she should stop at the lab for repeat kidney function test.  If kidney function still in current range, we will need to refer her to a kidney specialist.  Results for orders placed or performed in visit on 16/57/90  Basic Metabolic Panel  Result Value Ref Range   Glucose 186 (H) 70 - 99 mg/dL   BUN 21 8 - 27 mg/dL   Creatinine, Ser 2.15 (H) 0.57 - 1.00 mg/dL   eGFR 24 (L) >59 mL/min/1.73   BUN/Creatinine Ratio 10 (L) 12 - 28   Sodium 137 134 - 144 mmol/L   Potassium 4.9 3.5 - 5.2 mmol/L   Chloride 99 96 - 106 mmol/L   CO2 25 20 - 29 mmol/L   Calcium 9.4  8.7 - 10.3 mg/dL  Hemoglobin A1c  Result Value Ref Range   Hgb A1c MFr Bld 10.0 (H) 4.8 - 5.6 %   Est. average glucose Bld gHb Est-mCnc 240 mg/dL

## 2021-04-07 NOTE — Patient Instructions (Addendum)
Lantus pen:   Take 10 units every evening before bedtime.     Novolog pen:   Take 4 units twice a day before your two largest meals.

## 2021-04-07 NOTE — Progress Notes (Signed)
Patient was educated on the use of the Lantus and Novolog pens. Reviewed necessary supplies and operation of the pen. Also reviewed goal blood glucose levels. Patient was able to demonstrate use. All questions and concerns were addressed.  Time spent counseling: 15 minutes  Follow-up: 2 weeks   Benard Halsted, PharmD, [place], [place] [phone]

## 2021-04-08 ENCOUNTER — Other Ambulatory Visit: Payer: Self-pay | Admitting: Internal Medicine

## 2021-04-08 DIAGNOSIS — G8929 Other chronic pain: Secondary | ICD-10-CM

## 2021-04-08 DIAGNOSIS — I10 Essential (primary) hypertension: Secondary | ICD-10-CM

## 2021-04-08 DIAGNOSIS — M5440 Lumbago with sciatica, unspecified side: Secondary | ICD-10-CM

## 2021-04-08 NOTE — Telephone Encounter (Signed)
Requested medications are due for refill today yes  Requested medications are on the active medication list yes  Last refill 12/05/20  Last visit 03/29/21  Future visit scheduled 05/11/21  Notes to clinic This medication can not be delegated, please assess.

## 2021-04-15 ENCOUNTER — Other Ambulatory Visit: Payer: Self-pay

## 2021-04-15 ENCOUNTER — Encounter (HOSPITAL_COMMUNITY): Payer: Self-pay | Admitting: Emergency Medicine

## 2021-04-15 ENCOUNTER — Emergency Department (HOSPITAL_COMMUNITY)
Admission: EM | Admit: 2021-04-15 | Discharge: 2021-04-15 | Disposition: A | Discharge status: Home / Self Care | Payer: Medicare PPO | Attending: Emergency Medicine | Admitting: Emergency Medicine

## 2021-04-15 DIAGNOSIS — Z853 Personal history of malignant neoplasm of breast: Secondary | ICD-10-CM | POA: Insufficient documentation

## 2021-04-15 DIAGNOSIS — Z7982 Long term (current) use of aspirin: Secondary | ICD-10-CM | POA: Diagnosis not present

## 2021-04-15 DIAGNOSIS — Z794 Long term (current) use of insulin: Secondary | ICD-10-CM | POA: Insufficient documentation

## 2021-04-15 DIAGNOSIS — Z79899 Other long term (current) drug therapy: Secondary | ICD-10-CM | POA: Diagnosis not present

## 2021-04-15 DIAGNOSIS — I119 Hypertensive heart disease without heart failure: Secondary | ICD-10-CM | POA: Diagnosis not present

## 2021-04-15 DIAGNOSIS — I2510 Atherosclerotic heart disease of native coronary artery without angina pectoris: Secondary | ICD-10-CM | POA: Insufficient documentation

## 2021-04-15 DIAGNOSIS — Z7984 Long term (current) use of oral hypoglycemic drugs: Secondary | ICD-10-CM | POA: Insufficient documentation

## 2021-04-15 DIAGNOSIS — N390 Urinary tract infection, site not specified: Secondary | ICD-10-CM | POA: Diagnosis not present

## 2021-04-15 DIAGNOSIS — R509 Fever, unspecified: Secondary | ICD-10-CM | POA: Diagnosis present

## 2021-04-15 DIAGNOSIS — Z7951 Long term (current) use of inhaled steroids: Secondary | ICD-10-CM | POA: Diagnosis not present

## 2021-04-15 DIAGNOSIS — Z87891 Personal history of nicotine dependence: Secondary | ICD-10-CM | POA: Diagnosis not present

## 2021-04-15 DIAGNOSIS — Z20822 Contact with and (suspected) exposure to covid-19: Secondary | ICD-10-CM | POA: Diagnosis not present

## 2021-04-15 DIAGNOSIS — E119 Type 2 diabetes mellitus without complications: Secondary | ICD-10-CM | POA: Insufficient documentation

## 2021-04-15 DIAGNOSIS — R5383 Other fatigue: Secondary | ICD-10-CM | POA: Diagnosis not present

## 2021-04-15 DIAGNOSIS — Z9011 Acquired absence of right breast and nipple: Secondary | ICD-10-CM | POA: Insufficient documentation

## 2021-04-15 DIAGNOSIS — R110 Nausea: Secondary | ICD-10-CM | POA: Diagnosis not present

## 2021-04-15 LAB — CBC WITH DIFFERENTIAL/PLATELET
Abs Immature Granulocytes: 0.07 10*3/uL (ref 0.00–0.07)
Basophils Absolute: 0.1 10*3/uL (ref 0.0–0.1)
Basophils Relative: 0 %
Eosinophils Absolute: 0 10*3/uL (ref 0.0–0.5)
Eosinophils Relative: 0 %
HCT: 35.6 % — ABNORMAL LOW (ref 36.0–46.0)
Hemoglobin: 11 g/dL — ABNORMAL LOW (ref 12.0–15.0)
Immature Granulocytes: 1 %
Lymphocytes Relative: 9 %
Lymphs Abs: 1.1 10*3/uL (ref 0.7–4.0)
MCH: 25.6 pg — ABNORMAL LOW (ref 26.0–34.0)
MCHC: 30.9 g/dL (ref 30.0–36.0)
MCV: 83 fL (ref 80.0–100.0)
Monocytes Absolute: 0.5 10*3/uL (ref 0.1–1.0)
Monocytes Relative: 4 %
Neutro Abs: 10.4 10*3/uL — ABNORMAL HIGH (ref 1.7–7.7)
Neutrophils Relative %: 86 %
Platelets: 224 10*3/uL (ref 150–400)
RBC: 4.29 MIL/uL (ref 3.87–5.11)
RDW: 14.9 % (ref 11.5–15.5)
WBC: 12.1 10*3/uL — ABNORMAL HIGH (ref 4.0–10.5)
nRBC: 0 % (ref 0.0–0.2)

## 2021-04-15 LAB — COMPREHENSIVE METABOLIC PANEL
ALT: 13 U/L (ref 0–44)
AST: 17 U/L (ref 15–41)
Albumin: 3.4 g/dL — ABNORMAL LOW (ref 3.5–5.0)
Alkaline Phosphatase: 54 U/L (ref 38–126)
Anion gap: 8 (ref 5–15)
BUN: 23 mg/dL (ref 8–23)
CO2: 23 mmol/L (ref 22–32)
Calcium: 8.6 mg/dL — ABNORMAL LOW (ref 8.9–10.3)
Chloride: 105 mmol/L (ref 98–111)
Creatinine, Ser: 1.26 mg/dL — ABNORMAL HIGH (ref 0.44–1.00)
GFR, Estimated: 46 mL/min — ABNORMAL LOW (ref >60)
Glucose, Bld: 127 mg/dL — ABNORMAL HIGH (ref 70–99)
Potassium: 4.5 mmol/L (ref 3.5–5.1)
Sodium: 136 mmol/L (ref 135–145)
Total Bilirubin: 1.2 mg/dL (ref 0.3–1.2)
Total Protein: 6.6 g/dL (ref 6.5–8.1)

## 2021-04-15 LAB — URINALYSIS, ROUTINE W REFLEX MICROSCOPIC
Bilirubin Urine: NEGATIVE
Glucose, UA: NEGATIVE mg/dL
Ketones, ur: NEGATIVE mg/dL
Leukocytes,Ua: NEGATIVE
Nitrite: POSITIVE — AB
Protein, ur: 30 mg/dL — AB
Specific Gravity, Urine: 1.01 (ref 1.005–1.030)
pH: 6 (ref 5.0–8.0)

## 2021-04-15 LAB — URINALYSIS, MICROSCOPIC (REFLEX)
RBC / HPF: NONE SEEN RBC/hpf (ref 0–5)
Squamous Epithelial / HPF: NONE SEEN (ref 0–5)

## 2021-04-15 LAB — RESP PANEL BY RT-PCR (FLU A&B, COVID) ARPGX2
Influenza A by PCR: NEGATIVE
Influenza B by PCR: NEGATIVE
SARS Coronavirus 2 by RT PCR: NEGATIVE

## 2021-04-15 MED ORDER — CEPHALEXIN 500 MG PO CAPS: 500.0000 mg | ORAL_CAPSULE | Freq: Four times a day (QID) | ORAL | 0 refills | Status: AC

## 2021-04-15 MED ORDER — CEPHALEXIN 500 MG PO CAPS
500.0000 mg | ORAL_CAPSULE | Freq: Once | ORAL | Status: AC
  Administered 2021-04-15: 500 mg via ORAL
  Filled 2021-04-15: qty 1

## 2021-04-15 MED ORDER — CEPHALEXIN 500 MG PO CAPS: 500.0000 mg | ORAL_CAPSULE | Freq: Four times a day (QID) | ORAL | 0 refills | Status: DC

## 2021-04-15 NOTE — ED Provider Notes (Signed)
[facility] DEPT Provider Note   CSN: [id number] Arrival date & time: 04/15/21  1710     History Chief Complaint  Patient presents with   Fatigue   Chills    [name] is a 70 y.o. female.  Pt complains of fever and chills today.  Pt complains of having felt achy today.    The history is provided by the patient. No language interpreter was used.  Fever Max temp prior to arrival:  100 Temp source:  Subjective Severity:  Moderate Onset quality:  Gradual Duration:  1 day Timing:  Constant Progression:  Worsening Chronicity:  New Relieved by:  Nothing Worsened by:  Nothing Ineffective treatments:  None tried Associated symptoms: nausea   Risk factors: no sick contacts       Past Medical History:  Diagnosis Date   Abscess in epidural space of L2-L5 lumbar spine 03/2006   Anemia, iron deficiency    Arthritis    Breast cancer ([place]) 06/12/2017   right breast   CAD (coronary artery disease), native coronary artery 03/30/2015   Cataract    Colon polyp    a. Multiple colonic polyps status post colonoscopy in October 2007, consistent with tubular adenoma, tubulovillous adenoma with no high-grade dysplasia or malignancy identified.    Constipation    Coronary artery disease    a. s/p CABG 06/2010: S-D2; S-PDA (at time of MV surgery).   Depression    Diabetes mellitus    type II   Family history of breast cancer    Family history of prostate cancer    Genetic testing 09/25/2017   Multi-Cancer panel (83 genes) @ [facility] - No pathogenic mutations detected   GERD (gastroesophageal reflux disease)    Hx of transient ischemic attack (TIA)    a. See stroke section.   Hyperlipidemia    Hypertension    Hypoxia    a. Has history of acute hypoxic respiratory failure in the setting of bronchitis/PNA or prior admissions.   MRSA infection    a. History of recurrent skin infection and soft tissue abscesses, with MRSA positive in the  past.   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 mitral valve - s/p resection and MV repair 06/2010 Dr. [name].   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 Distal R MCA.   Stroke [facility])    a. 11/2009: mitral mass diagnosed at this time, also has distal R MCA stenosis, tx with coumadin. b. Readmitted 05/2010 with TIA symptoms - had not been taking Coumadin. s/p MV surgery 06/2010. Coumadin stopped 2013 after review of chart by Dr. [name] since mass was removed (stroke felt possibly related to this).     Patient Active Problem List   Diagnosis Date Noted   Controlled substance agreement signed 12/31/2020   Shoulder stiffness, left 07/07/2020   Status post lumbar spine surgery for decompression of spinal cord 06/09/2020   Lumbar stenosis 05/27/2020   Spinal stenosis of lumbar region 05/21/2019   Complete tear of left rotator cuff 05/16/2019   Uncontrolled type 2 diabetes mellitus with hyperglycemia ([place]) 12/19/2018   Microalbuminuria 04/08/2018   Genetic testing 09/25/2017   Family history of breast cancer    Family history of prostate cancer    Malignant neoplasm of upper-outer quadrant of right breast in female, estrogen receptor positive (  [place]) 06/26/2017   Lumbar herniated disc 04/15/2015   Abnormal nuclear stress test 04/03/2015   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   CAD (coronary artery disease), native coronary artery 03/30/2015   S/P mitral valve repair 11/16/2010   Dyslipidemia 06/29/2010   Diabetes ([place]) 09/29/2006   ANEMIA-IRON DEFICIENCY 09/29/2006   Essential hypertension 09/29/2006    Past Surgical History:  Procedure Laterality Date   BREAST BIOPSY Right 08/26/2019   benign   BREAST LUMPECTOMY Right    BREAST LUMPECTOMY WITH RADIOACTIVE SEED AND SENTINEL LYMPH NODE BIOPSY Right 07/20/2017   Procedure: RIGHT BREAST LUMPECTOMY WITH  RADIOACTIVE SEED AND SENTINEL LYMPH NODE BIOPSY;  Surgeon: Alphonsa Overall, MD;  Location: [facility];  Service: General;  Laterality: Right;   CARDIAC CATHETERIZATION  04/03/2015   Procedure: Left Heart Cath and Cors/Grafts Angiography;  Surgeon: Peter M Martinique, MD;  Location: [facility] CV LAB;  Service: Cardiovascular;;   CORONARY ARTERY BYPASS GRAFT  1/12   history of ankle fractures requiring surgery     LUMBAR LAMINECTOMY/DECOMPRESSION MICRODISCECTOMY Left 04/15/2015   Procedure: LUMBAR LAMINECTOMY/DECOMPRESSION MICRODISCECTOMY 1 LEVEL;  Surgeon: Newman Pies, MD;  Location: [facility] NEURO ORS;  Service: Neurosurgery;  Laterality: Left;  Left L23 microdiskectomy   LUMBAR LAMINECTOMY/DECOMPRESSION MICRODISCECTOMY N/A 05/27/2020   Procedure: L2-3, L3-4  decompression;  Surgeon: [name], MD;  Location: [facility];  Service: Orthopedics;  Laterality: N/A;   MV repair and resection of mass  1/12   RESECTION DISTAL CLAVICAL Left 05/16/2019   Procedure: RESECTION DISTAL CLAVICAL;  Surgeon: Mcarthur Rossetti, MD;  Location: [facility];  Service: Orthopedics;  Laterality: Left;   SHOULDER ARTHROSCOPY WITH ROTATOR CUFF REPAIR AND SUBACROMIAL DECOMPRESSION Left 05/16/2019   Procedure: SHOULDER ARTHROSCOPY WITH ROTATOR CUFF REPAIR AND SUBACROMIAL DECOMPRESSION;  Surgeon: Mcarthur Rossetti, MD;  Location: [facility];  Service: Orthopedics;  Laterality: Left;   TONSILLECTOMY  1971   TOTAL ABDOMINAL HYSTERECTOMY  1978   VESICOVAGINAL FISTULA CLOSURE W/ TAH       OB History   No obstetric history on file.     Family History  Problem Relation Age of Onset   Colon cancer Mother        dx 45s; deceased 65   Irritable bowel syndrome Mother    Prostate cancer Father        deceased 40s   Cirrhosis Sister        died of GI bleed associated with cirrhosis of the liver   Stroke Brother    Prostate cancer Brother 84       deceased 52   Diabetes Brother        x3    Kidney disease Brother    Cancer Brother 36       unk. type   Breast cancer Cousin        several maternal 1st and 2nd cousins with breast cancer    Social History   Tobacco Use   Smoking status: Former    Types: Cigarettes    Quit date: 10/14/2010    Years since quitting: 10.5   Smokeless tobacco: Never  Vaping Use   Vaping Use: Never used  Substance Use Topics   Alcohol use: Yes    Comment: social   Drug use: No    Home Medications Prior to Admission medications   Medication Sig Start Date End Date Taking? Authorizing Provider  albuterol (VENTOLIN HFA) 108 (90 Base) MCG/ACT inhaler Inhale 2 puffs into the lungs  every 6 (six) hours as needed for wheezing or shortness of breath. 07/29/20  Yes [name], MD  amLODipine (NORVASC) 10 MG tablet Take 1 tablet (10 mg total) by mouth daily. 03/17/20  Yes [name], MD  Ascorbic Acid (VITAMIN C PO) Take 500 mg by mouth daily.   Yes [provider]  aspirin EC 81 MG tablet Take 1 tablet (81 mg total) by mouth daily. 06/26/14  Yes Lance Bosch, NP  atorvastatin (LIPITOR) 80 MG tablet Take 1 tablet (80 mg total) by mouth daily. 01/22/20  Yes [name], MD  buPROPion (WELLBUTRIN XL) 150 MG 24 hr tablet Take 1 tablet (150 mg total) by mouth 2 (two) times daily. 01/20/20  Yes [name], MD  carvedilol (COREG) 12.5 MG tablet Take 1 tablet (12.5 mg total) by mouth 2 (two) times daily. 02/07/20 04/15/21 Yes Turner, Eber Hong, MD  ferrous sulfate (FEROSUL) 325 (65 FE) MG tablet TAKE 1 TABLET (325 MG TOTAL) BY MOUTH DAILY WITH BREAKFAST. Patient taking differently: Take 325 mg by mouth daily. TAKE 1 TABLET (325 MG TOTAL) BY MOUTH DAILY WITH BREAKFAST. 05/15/20  Yes [name], MD  hydrochlorothiazide (HYDRODIURIL) 25 MG tablet TAKE 1 TABLET EVERY DAY Patient taking differently: Take 25 mg by mouth daily. 04/08/21  Yes [name], MD  insulin aspart (NOVOLOG FLEXPEN) 100 UNIT/ML FlexPen 4 units  subcut with the two largest meals of the day. Patient taking differently: Inject 4 Units into the skin 2 (two) times daily. 4 units subcut with the two largest meals of the day. 04/07/21  Yes [name], MD  insulin glargine (LANTUS) 100 UNIT/ML Solostar Pen Inject 10 Units into the skin at bedtime. 04/07/21  Yes [name], MD  letrozole [facility]) 2.5 MG tablet TAKE 1 TABLET EVERY DAY Patient taking differently: Take 2.5 mg by mouth daily. 07/27/20  Yes Nicholas Lose, MD  losartan (COZAAR) 100 MG tablet Take 1 tablet (100 mg total) by mouth daily. 03/17/20  Yes [name], MD  methocarbamol (ROBAXIN) 500 MG tablet TAKE 1 TABLET (500 MG TOTAL) BY MOUTH EVERY 8 (EIGHT) HOURS AS NEEDED FOR MUSCLE SPASMS. 04/09/21  Yes [name], MD  pantoprazole (PROTONIX) 40 MG tablet TAKE 1 TABLET EVERY DAY Patient taking differently: Take 40 mg by mouth daily. 07/25/20  Yes [name], MD  traMADol (ULTRAM) 50 MG tablet Take 1 tablet (50 mg total) by mouth daily as needed. Patient taking differently: Take 50 mg by mouth daily as needed for moderate pain. 11/28/20  Yes [name], MD  traZODone (DESYREL) 50 MG tablet TAKE 1 TABLET AT BEDTIME AS NEEDED FOR SLEEP Patient taking differently: Take 50 mg by mouth at bedtime. 11/20/20  Yes [name], MD  Accu-Chek Softclix Lancets lancets Use as instructed 07/30/20   [name], MD  Blood Glucose Monitoring Suppl (ACCU-CHEK AVIVA PLUS) w/Device KIT Use as directed 07/30/20   [name], MD  diclofenac Sodium (VOLTAREN) 1 % GEL APPLY 2 GRAMS TOPICALLY 4 (FOUR) TIMES DAILY AS NEEDED. Patient not taking: Reported on 04/15/2021 01/20/20   [name], MD  empagliflozin (JARDIANCE) 10 MG TABS tablet Take 1 tablet (10 mg total) by mouth daily before breakfast. 03/29/21   [name], MD  gabapentin (NEURONTIN) 100 MG capsule Take one at night for a few days then increase to one twice a day. Patient not  taking: No sig reported 02/04/20  [name], MD  glipiZIDE (GLUCOTROL) 5 MG tablet Take 1 tablet (5 mg total) by mouth daily before breakfast. Patient not taking: No sig reported 03/29/21   [name], MD  glucose blood (ACCU-CHEK AVIVA PLUS) test strip Use as instructed 07/30/20   [name], MD  insulin aspart (NOVOLOG FLEXPEN) 100 UNIT/ML FlexPen Inj 4 units subcut with the 2 larges meals of the day Patient not taking: No sig reported 04/07/21   [name], MD  insulin glargine (LANTUS) 100 UNIT/ML Solostar Pen Inject 10 Units into the skin at bedtime. Patient not taking: No sig reported 04/07/21   [name], MD  Insulin Pen Needle (PEN NEEDLES) 31G X 8 MM MISC UAD 04/07/21   [name], MD  metFORMIN (GLUCOPHAGE) 1000 MG tablet Take 1 tablet (1,000 mg total) by mouth 2 (two) times daily with a meal. Patient not taking: No sig reported 12/31/20   [name], MD    Allergies    Patient has no known allergies.  Review of Systems   Review of Systems  Constitutional:  Positive for fever.  Gastrointestinal:  Positive for nausea.  All other systems reviewed and are negative.  Physical Exam Updated Vital Signs BP (!) 155/92 (BP Location: Right Arm)   Pulse 75   Temp 99 F (37.2 C) (Oral)   Resp 16   SpO2 100%   Physical Exam Vitals and nursing note reviewed.  Constitutional:      Appearance: She is well-developed.  HENT:     Head: Normocephalic.     Mouth/Throat:     Mouth: Mucous membranes are moist.  Eyes:     Pupils: Pupils are equal, round, and reactive to light.  Cardiovascular:     Rate and Rhythm: Normal rate and regular rhythm.     Pulses: Normal pulses.  Pulmonary:     Effort: Pulmonary effort is normal.  Abdominal:     General: Abdomen is flat. There is no distension.     Palpations: Abdomen is soft.  Musculoskeletal:        General: Normal range of motion.     Cervical back: Normal range of motion.  Skin:     General: Skin is warm.  Neurological:     General: No focal deficit present.     Mental Status: She is alert and oriented to person, place, and time.  Psychiatric:        Mood and Affect: Mood normal.    ED Results / Procedures / Treatments   Labs (all labs ordered are listed, but only abnormal results are displayed) Labs Reviewed  CBC WITH DIFFERENTIAL/PLATELET - Abnormal; Notable for the following components:      Result Value   WBC 12.1 (*)    Hemoglobin 11.0 (*)    HCT 35.6 (*)    MCH 25.6 (*)    Neutro Abs 10.4 (*)    All other components within normal limits  COMPREHENSIVE METABOLIC PANEL - Abnormal; Notable for the following components:   Glucose, Bld 127 (*)    Creatinine, Ser 1.26 (*)    Calcium 8.6 (*)    Albumin 3.4 (*)    GFR, Estimated 46 (*)    All other components within normal limits  URINALYSIS, ROUTINE W REFLEX MICROSCOPIC - Abnormal; Notable for the following components:   Hgb urine dipstick TRACE (*)    Protein, ur 30 (*)    Nitrite POSITIVE (*)    All other components  within normal limits  URINALYSIS, MICROSCOPIC (REFLEX) - Abnormal; Notable for the following components:   Bacteria, UA MANY (*)    All other components within normal limits  RESP PANEL BY RT-PCR (FLU A&B, COVID) ARPGX2    EKG None  Radiology No results found.  Procedures Procedures   Medications Ordered in ED Medications  cephALEXin (KEFLEX) capsule 500 mg (has no administration in time range)    ED Course  I have reviewed the triage vital signs and the nursing notes.  Pertinent labs & imaging results that were available during my care of the patient were reviewed by me and considered in my medical decision making (see chart for details).    MDM Rules/Calculators/A&P                           MDM: Labs obtained interpreted and discussed with pt.  Ua ahow many bacteria, covid and flu are negative.  Pt given keflex $RemoveBefo'500mg'[id number]$  here.   Final Clinical Impression(s) / ED  Diagnoses Final diagnoses:  Urinary tract infection without hematuria, site unspecified    Rx / DC Orders ED Discharge Orders     None     An After Visit Summary was printed and given to the patient.    Sidney Ace 04/15/21 2156    Stacy Leigh, MD 04/15/21 2231

## 2021-04-15 NOTE — ED Triage Notes (Signed)
Pt c/o chills, headache, fatigue since earlier today. Denies cough, n/v, shob, and exposure to known sick person. Recently dx with T2DM and started on insulin.

## 2021-04-21 ENCOUNTER — Ambulatory Visit: Payer: Medicare PPO | Attending: Internal Medicine | Admitting: Pharmacist

## 2021-04-21 ENCOUNTER — Other Ambulatory Visit: Payer: Self-pay

## 2021-04-21 DIAGNOSIS — E669 Obesity, unspecified: Secondary | ICD-10-CM

## 2021-04-21 DIAGNOSIS — E1169 Type 2 diabetes mellitus with other specified complication: Secondary | ICD-10-CM | POA: Diagnosis not present

## 2021-04-21 MED ORDER — METFORMIN HCL 1000 MG PO TABS: 1000.0000 mg | ORAL_TABLET | Freq: Two times a day (BID) | ORAL | 2 refills | Status: DC

## 2021-04-21 MED ORDER — METFORMIN HCL 500 MG PO TABS: 500.0000 mg | ORAL_TABLET | Freq: Two times a day (BID) | ORAL | 2 refills | Status: DC

## 2021-04-21 NOTE — Progress Notes (Addendum)
S:    PCP: Dr. Wynetta Emery  Patient arrives in good spirits, accompanied by husband.  Presents for diabetes evaluation, education, and management. Patient was referred and last seen by Primary Care Provider on 03/29/2021. Patient was last seen by pharmacy on 04/07/2021. Patient seen in the [facility] on 04/15/2021 for a urinary tract infection - currently treated with Keflex 500 mg QID x 10 days.   Today (04/21/2021), patient reports feeling well. She expresses concern about cost of her medications especially her Lantus. She reports medication adherence with Lantus and Novolog. She reports no missed doses in the last two weeks and reports taking doses prior to visit today. She reports that Dr. Wynetta Emery told her to discontinue both Glipizide and Metformin once she initiated both insulins. Patient reports unable to start Jardiance and Trulicity due to cost.   Current diabetes medications include: lantus 10 units daily, novolog 4 units BID, metformin 1000 mg BID (not taking), glipizide 5 mg daily (not taking), jardiance 10 mg daily (not taking) Current hypertension medications include: amlodipine 10 mg daily, carvedilol 12.5 mg BID, HCTZ 25 mg daily, losartan 100 mg daily Current hyperlipidemia medications include: atorvastatin 80 mg daily  Family/Social History: Family Hx: stroke (brother) Social Hx: former smoker (quit 2012), denies smokeless tobacco use  Insurance coverage/medication affordability: [employer] PPO  Patient denies hypoglycemic events though able to verbalize appropriate hypoglycemic plan.   Patient reported dietary habits: Eats 3 meals/day. Patient reports eating more proteins with eggs and meats. She reports reduction in serving size. Denies eating fast food, sugary foods, and sugary beverages.   Patient-reported exercise habits: none   Patient reports nocturia (nighttime urination) about 1-2 times a night Patient reports occasional neuropathy (nerve pain) in  arms Patient denies visual changes. Patient reports self foot exams.     O:  POCT: 125 (2H-PPBG), per patient blood sugar 102 at 0900  Lab Results  Component Value Date   HGBA1C 10.0 (H) 04/05/2021   There were no vitals filed for this visit.  Lipid Panel     Component Value Date/Time   CHOL 223 (H) 12/31/2020 1010   TRIG 170 (H) 12/31/2020 1010   HDL 81 12/31/2020 1010   CHOLHDL 2.8 12/31/2020 1010   CHOLHDL 2.8 04/28/2016 1028   VLDL 19 04/28/2016 1028   LDLCALC 113 (H) 12/31/2020 1010   LDLDIRECT 101.7 07/18/2011 0901   Patient reports checking blood sugar levels twice daily - has results at visit today. Mostly blood sugars < 200 mg/dL though outlier blood sugars of 204, 224 and one AM blood sugar level of 406.   Clinical Atherosclerotic Cardiovascular Disease (ASCVD): Yes  The 10-year ASCVD risk score (Arnett DK, et al., 2019) is: 49.1%   Values used to calculate the score:     Age: 24 years     Sex: Female     Is Non-Hispanic African American: Yes     Diabetic: Yes     Tobacco smoker: No     Systolic Blood Pressure: 973 mmHg     Is BP treated: Yes     HDL Cholesterol: 81 mg/dL     Total Cholesterol: 223 mg/dL   A/P: Diabetes Goals: To have fasting blood sugar 80-130 mg/dL, 2H-PBG < 180 mg/dL, and an A1c < 7%.  Diabetes longstanding currently uncontrolled. Patient is able to verbalize appropriate hypoglycemia management plan. Medication adherence reported with Lantus and Novolog. Patient reports no missed doses in the last two weeks and reports taking doses prior  to visit today.   ADDENDUM: Pt had recent decrease in kidney function 04/05/2021. Scr 1.26 on recheck on 04/15/2021. After discussion with Dr. Wynetta Emery, will start 500 mg BID.   - Start Metformin 500 mg BID - Continue Lantus 10 units daily - Continue Novolog 4 units BID before meals - Extensively discussed pathophysiology of diabetes, recommended lifestyle interventions, dietary effects on blood sugar  control - Counseled on s/sx of and management of hypoglycemia - Next A1C anticipated 06/2021.   Follow up with Dr. Wynetta Emery on 05/11/2021  Written patient instructions provided.  Total time in face to face counseling 30 minutes.    Patient seen by:  Darcel Smalling, Student Pharmacist [place] [employer], PharmD, [place], [place] [phone]

## 2021-04-21 NOTE — Addendum Note (Signed)
Addended by: Daisy Blossom, Annie Main L on: 04/21/2021 04:53 PM   Modules accepted: Orders

## 2021-04-23 ENCOUNTER — Other Ambulatory Visit: Payer: Self-pay | Admitting: Internal Medicine

## 2021-04-23 DIAGNOSIS — I10 Essential (primary) hypertension: Secondary | ICD-10-CM

## 2021-04-23 DIAGNOSIS — E782 Mixed hyperlipidemia: Secondary | ICD-10-CM

## 2021-04-23 NOTE — Telephone Encounter (Signed)
Copied from [facility] [id number]. Topic: Quick Communication - Rx Refill/Question >> Apr 23, 2021  1:47 PM Leward Quan A wrote: Medication: carvedilol (COREG) 12.5 MG tablet, atorvastatin (LIPITOR) 80 MG tablet, amLODipine (NORVASC) 10 MG tablet   Has the patient contacted their pharmacy? Yes.  Pharmacy called  (Agent: If no, request that the patient contact the pharmacy for the refill. If patient does not wish to contact the pharmacy document the reason why and proceed with request.) (Agent: If yes, when and what did the pharmacy advise?)  Preferred Pharmacy (with phone number or street name): [place], [place]  Phone:  [phone] Fax:  [phone]    Has the patient been seen for an appointment in the last year OR does the patient have an upcoming appointment? Yes.    Agent: Please be advised that RX refills may take up to 3 business days. We ask that you follow-up with your pharmacy.

## 2021-04-24 MED ORDER — AMLODIPINE BESYLATE 10 MG PO TABS: 10.0000 mg | ORAL_TABLET | Freq: Every day | ORAL | 0 refills | Status: DC

## 2021-04-24 MED ORDER — ATORVASTATIN CALCIUM 80 MG PO TABS: 80.0000 mg | ORAL_TABLET | Freq: Every day | ORAL | 0 refills | Status: DC

## 2021-04-24 NOTE — Telephone Encounter (Signed)
Requested medication (s) are due for refill today:yes  Requested medication (s) are on the active medication list: yes   Last refill: 02/07/20  #180  3 refills  Future visit scheduled yes 05/11/21  Notes to clinic: Historical Provider  Requested Prescriptions  Pending Prescriptions Disp Refills   carvedilol (COREG) 12.5 MG tablet 180 tablet 3    Sig: Take 1 tablet (12.5 mg total) by mouth 2 (two) times daily.     Cardiovascular:  Beta Blockers Failed - 04/23/2021  3:24 PM      Failed - Last BP in normal range    BP Readings from Last 1 Encounters:  04/15/21 (!) 174/76          Passed - Last Heart Rate in normal range    Pulse Readings from Last 1 Encounters:  04/15/21 65          Passed - Valid encounter within last 6 months    Recent Outpatient Visits           3 days ago Type 2 diabetes mellitus with obesity [facility])   [place], Annie Main L, RPH-CPP   2 weeks ago Type 2 diabetes mellitus with obesity [facility])   [place], Jarome Matin, RPH-CPP   3 weeks ago Encounter for [employer] annual wellness exam   [place] [name], MD   3 months ago Encounter for medication review   [place], Jarome Matin, RPH-CPP   3 months ago Type 2 diabetes mellitus with obesity [facility])   [place], MD       Future Appointments             In 3 days [place] Pax, Eber Hong, MD [place], [facility]   In 2 weeks [name], MD [place]   In 2 weeks Tresa Endo, [place]            Signed Prescriptions Disp Refills   atorvastatin (LIPITOR) 80 MG tablet 90 tablet 0    Sig: Take 1 tablet (80 mg total) by mouth daily.     Cardiovascular:  Antilipid - Statins Failed -  04/23/2021  3:24 PM      Failed - Total Cholesterol in normal range and within 360 days    Cholesterol, Total  Date Value Ref Range Status  12/31/2020 223 (H) 100 - 199 mg/dL Final          Failed - LDL in normal range and within 360 days    LDL Chol Calc (NIH)  Date Value Ref Range Status  12/31/2020 113 (H) 0 - 99 mg/dL Final   Direct LDL  Date Value Ref Range Status  07/18/2011 101.7 mg/dL Final    Comment:    Optimal:  <100 mg/dLNear or Above Optimal:  100-129 mg/dLBorderline High:  130-159 mg/dLHigh:  160-189 mg/dLVery High:  >190 mg/dL          Failed - Triglycerides in normal range and within 360 days    Triglycerides  Date Value Ref Range Status  12/31/2020 170 (H) 0 - 149 mg/dL Final          Passed - HDL in normal range and within 360 days    HDL  Date Value Ref Range Status  12/31/2020  81 >39 mg/dL Final          Passed - Patient is not pregnant      Passed - Valid encounter within last 12 months    Recent Outpatient Visits           3 days ago Type 2 diabetes mellitus with obesity [facility])   [place], Annie Main L, RPH-CPP   2 weeks ago Type 2 diabetes mellitus with obesity [facility])   [place], Jarome Matin, RPH-CPP   3 weeks ago Encounter for [employer] annual wellness exam   [place] [name], MD   3 months ago Encounter for medication review   [place], Stephen L, RPH-CPP   3 months ago Type 2 diabetes mellitus with obesity [facility])   [place], MD       Future Appointments             In 3 days Rad[place] Pax, Eber Hong, MD [place], [facility]   In 2 weeks [name], MD [place]   In 2 weeks Tresa Endo, [place]              amLODipine (NORVASC) 10 MG tablet 90 tablet 0    Sig: Take 1 tablet (10 mg total) by mouth daily.     Cardiovascular:  Calcium Channel Blockers Failed - 04/23/2021  3:24 PM      Failed - Last BP in normal range    BP Readings from Last 1 Encounters:  04/15/21 (!) 174/76          Passed - Valid encounter within last 6 months    Recent Outpatient Visits           3 days ago Type 2 diabetes mellitus with obesity [facility])   [place], Annie Main L, RPH-CPP   2 weeks ago Type 2 diabetes mellitus with obesity [facility])   [place], Jarome Matin, RPH-CPP   3 weeks ago Encounter for [employer] annual wellness exam   [place], MD   3 months ago Encounter for medication review   [place], Jarome Matin, RPH-CPP   3 months ago Type 2 diabetes mellitus with obesity [facility])   [place], MD       Future Appointments             In 3 days [place], Eber Hong, MD [place], [facility]   In 2 weeks [name], MD [place]   In 2 weeks Daisy Blossom, Jarome Matin, [place]

## 2021-04-27 ENCOUNTER — Encounter: Payer: Self-pay | Admitting: Cardiology

## 2021-04-27 ENCOUNTER — Ambulatory Visit (INDEPENDENT_AMBULATORY_CARE_PROVIDER_SITE_OTHER): Payer: Medicare PPO | Admitting: Cardiology

## 2021-04-27 ENCOUNTER — Other Ambulatory Visit: Payer: Self-pay

## 2021-04-27 VITALS — BP 160/82 | HR 68 | Ht 68.0 in | Wt 246.0 lb

## 2021-04-27 DIAGNOSIS — E7800 Pure hypercholesterolemia, unspecified: Secondary | ICD-10-CM

## 2021-04-27 DIAGNOSIS — I10 Essential (primary) hypertension: Secondary | ICD-10-CM

## 2021-04-27 DIAGNOSIS — I350 Nonrheumatic aortic (valve) stenosis: Secondary | ICD-10-CM

## 2021-04-27 DIAGNOSIS — D151 Benign neoplasm of heart: Secondary | ICD-10-CM | POA: Diagnosis not present

## 2021-04-27 DIAGNOSIS — I2510 Atherosclerotic heart disease of native coronary artery without angina pectoris: Secondary | ICD-10-CM

## 2021-04-27 MED ORDER — CARVEDILOL 25 MG PO TABS: 25.0000 mg | ORAL_TABLET | Freq: Two times a day (BID) | ORAL | 3 refills | Status: DC

## 2021-04-27 NOTE — Addendum Note (Signed)
Addended by: Patterson Hammersmith A on: 04/27/2021 11:15 AM   Modules accepted: Orders

## 2021-04-27 NOTE — Patient Instructions (Signed)
Medication Instuctions: Your physician has recommended you make the following change in your medication:   Increase Carvedilol 25 mg twice a day  *If you need a refill on your cardiac medications before your next appointment, please call your pharmacy*   Lab Work: Fasting lipids and CMET  If you have labs (blood work) drawn today and your tests are completely normal, you will receive your results only by: [place] (if you have MyChart) OR A paper copy in the mail If you have any lab test that is abnormal or we need to change your treatment, we will call you to review the results.  Follow-Up: At [facility], you and your health needs are our priority.  As part of our continuing mission to provide you with exceptional heart care, we have created designated Provider Care Teams.  These Care Teams include your primary Cardiologist (physician) and Advanced Practice Providers (APPs -  Physician Assistants and Nurse Practitioners) who all work together to provide you with the care you need, when you need it.  We recommend signing up for the patient portal called "MyChart".  Sign up information is provided on this After Visit Summary.  MyChart is used to connect with patients for Virtual Visits (Telemedicine).  Patients are able to view lab/test results, encounter notes, upcoming appointments, etc.  Non-urgent messages can be sent to your provider as well.   To learn more about what you can do with MyChart, go to [email or web address].    Your next appointment:   6 month(s)  The format for your next appointment:   In Person  Provider:   You may see Fransico Him, MD or one of the following Advanced Practice Providers on your designated Care Team:   Melina Copa, PA-C Ermalinda Barrios, PA-C   Other Instructions You have been referred to see Pharm D in the hypertension clinic in two weeks.

## 2021-04-27 NOTE — Progress Notes (Signed)
Cardiology Office Note   Date:  04/27/2021   ID:  [name], [name] 11/19/1950, MRN [id number]  PCP:  [name], MD    Chief Complaint  Patient presents with   Coronary Artery Disease   Hypertension   Aortic Stenosis   Hyperlipidemia      History of Present Illness: [name] is a 70 y.o. 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 [name] in 2012.  She has a history of HTN, DM, dyslipidemia and TIA.  Her last cath showed severe 2 vessel CAD with patent SVG>PDA and SVG>D2 with normal LVF.    She is here today for followup and is doing well.  She denies any chest pain or pressure, SOB, DOE, PND, orthopnea, dizziness, palpitations or syncope. She has occasional LE edema which is stable. She is compliant with her meds and is tolerating meds with no SE.     Past Medical History:  Diagnosis Date   Abscess in epidural space of L2-L5 lumbar spine 03/2006   Anemia, iron deficiency    Arthritis    Breast cancer ([place]) 06/12/2017   right breast   CAD (coronary artery disease), native coronary artery 03/30/2015   Cataract    Colon polyp    a. Multiple colonic polyps status post colonoscopy in October 2007, consistent with tubular adenoma, tubulovillous adenoma with no high-grade dysplasia or malignancy identified.    Constipation    Coronary artery disease    a. s/p CABG 06/2010: S-D2; S-PDA (at time of MV surgery).   Depression    Diabetes mellitus    type II   Family history of breast cancer    Family history of prostate cancer    Genetic testing 09/25/2017   Multi-Cancer panel (83 genes) @ [facility] - No pathogenic mutations detected   GERD (gastroesophageal reflux disease)    Hx of transient ischemic attack (TIA)    a. See stroke section.   Hyperlipidemia    Hypertension    Hypoxia    a. Has history of acute  hypoxic respiratory failure in the setting of bronchitis/PNA or prior admissions.   MRSA infection    a. History of recurrent skin infection and soft tissue abscesses, with MRSA positive in the past.   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 mitral valve - s/p resection and MV repair 06/2010 Dr. [name].   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 Distal R MCA.   Stroke [facility])    a. 11/2009: mitral mass diagnosed at this time, also has distal R MCA stenosis, tx with coumadin. b. Readmitted 05/2010 with TIA symptoms - had not been taking Coumadin. s/p MV surgery 06/2010. Coumadin stopped 2013 after review of chart by Dr. [name] since mass was removed (stroke felt possibly related to this).     Past Surgical History:  Procedure Laterality Date   BREAST BIOPSY Right 08/26/2019   benign   BREAST LUMPECTOMY Right    BREAST LUMPECTOMY WITH RADIOACTIVE SEED AND SENTINEL LYMPH NODE BIOPSY Right 07/20/2017   Procedure: RIGHT BREAST LUMPECTOMY WITH RADIOACTIVE SEED AND SENTINEL LYMPH NODE  BIOPSY;  Surgeon: Alphonsa Overall, MD;  Location: [place];  Service: General;  Laterality: Right;   CARDIAC CATHETERIZATION  04/03/2015   Procedure: Left Heart Cath and Cors/Grafts Angiography;  Surgeon: Peter M Martinique, MD;  Location: [place] CV LAB;  Service: Cardiovascular;;   CORONARY ARTERY BYPASS GRAFT  1/12   history of ankle fractures requiring surgery     LUMBAR LAMINECTOMY/DECOMPRESSION MICRODISCECTOMY Left 04/15/2015   Procedure: LUMBAR LAMINECTOMY/DECOMPRESSION MICRODISCECTOMY 1 LEVEL;  Surgeon: Newman Pies, MD;  Location: [place] [facility]UR[facility];  Service: Neurosurgery;  Laterality: Left;  Left L23 microdiskectomy   LUMBAR LAMINECTOMY/DECOMPRESSION MICRODISCECTOMY N/A 05/27/2020   Procedure: L2-3, L3-4  decompression;  Surgeon: [name], MD;  Location: [place];  Service: Orthopedics;   Laterality: N/A;   MV repair and resection of mass  1/12   RESECTION DISTAL CLAVICAL Left 05/16/2019   Procedure: RESECTION DISTAL CLAVICAL;  Surgeon: Mcarthur Rossetti, MD;  Location: [place];  Service: Orthopedics;  Laterality: Left;   SHOULDER ARTHROSCOPY WITH ROTATOR CUFF REPAIR AND SUBACROMIAL DECOMPRESSION Left 05/16/2019   Procedure: SHOULDER ARTHROSCOPY WITH ROTATOR CUFF REPAIR AND SUBACROMIAL DECOMPRESSION;  Surgeon: Mcarthur Rossetti, MD;  Location: [place];  Service: Orthopedics;  Laterality: Left;   TONSILLECTOMY  1971   TOTAL ABDOMINAL HYSTERECTOMY  1978   VESICOVAGINAL FISTULA CLOSURE W/ TAH       Current Outpatient Medications  Medication Sig Dispense Refill   Accu-Chek Softclix Lancets lancets Use as instructed 100 each 12   albuterol (VENTOLIN HFA) 108 (90 Base) MCG/ACT inhaler Inhale 2 puffs into the lungs every 6 (six) hours as needed for wheezing or shortness of breath. 1 each 2   amLODipine (NORVASC) 10 MG tablet Take 1 tablet (10 mg total) by mouth daily. 90 tablet 0   Ascorbic Acid (VITAMIN C PO) Take 500 mg by mouth daily.     aspirin EC 81 MG tablet Take 1 tablet (81 mg total) by mouth daily. 30 tablet 5   atorvastatin (LIPITOR) 80 MG tablet Take 1 tablet (80 mg total) by mouth daily. 90 tablet 0   Blood Glucose Monitoring Suppl (ACCU-CHEK AVIVA PLUS) w/Device KIT Use as directed 1 kit 0   buPROPion (WELLBUTRIN XL) 150 MG 24 hr tablet Take 1 tablet (150 mg total) by mouth 2 (two) times daily. 180 tablet 2   carvedilol (COREG) 12.5 MG tablet Take 1 tablet (12.5 mg total) by mouth 2 (two) times daily. 180 tablet 3   cholecalciferol (VITAMIN D3) 25 MCG (1000 UNIT) tablet Take 1,000 Units by mouth daily.     ferrous sulfate (FEROSUL) 325 (65 FE) MG tablet TAKE 1 TABLET (325 MG TOTAL) BY MOUTH DAILY WITH BREAKFAST. (Patient taking differently: Take 325 mg by mouth daily. TAKE 1 TABLET (325 MG TOTAL) BY MOUTH DAILY WITH  BREAKFAST.) 90 tablet 0   glucose blood (ACCU-CHEK AVIVA PLUS) test strip Use as instructed 100 each 12   hydrochlorothiazide (HYDRODIURIL) 25 MG tablet TAKE 1 TABLET EVERY DAY (Patient taking differently: Take 25 mg by mouth daily.) 90 tablet 1   insulin aspart (NOVOLOG FLEXPEN) 100 UNIT/ML FlexPen Inj 4 units subcut with the 2 larges meals of the day 15 mL 11   insulin glargine (LANTUS) 100 UNIT/ML Solostar Pen Inject 10 Units into the skin at bedtime. 15 mL 11   Insulin Pen Needle (PEN NEEDLES) 31G X 8 MM MISC UAD 100 each 6   letrozole (FEMARA) 2.5 MG tablet TAKE 1 TABLET EVERY DAY (  Patient taking differently: Take 2.5 mg by mouth daily.) 30 tablet 0   losartan (COZAAR) 100 MG tablet Take 1 tablet (100 mg total) by mouth daily. 90 tablet 3   metFORMIN (GLUCOPHAGE) 500 MG tablet Take 1 tablet (500 mg total) by mouth 2 (two) times daily with a meal. 60 tablet 2   methocarbamol (ROBAXIN) 500 MG tablet TAKE 1 TABLET (500 MG TOTAL) BY MOUTH EVERY 8 (EIGHT) HOURS AS NEEDED FOR MUSCLE SPASMS. 60 tablet 0   pantoprazole (PROTONIX) 40 MG tablet TAKE 1 TABLET EVERY DAY (Patient taking differently: Take 40 mg by mouth daily.) 90 tablet 3   traMADol (ULTRAM) 50 MG tablet Take 1 tablet (50 mg total) by mouth daily as needed. (Patient taking differently: Take 50 mg by mouth daily as needed for moderate pain.) 30 tablet 1   traZODone (DESYREL) 50 MG tablet TAKE 1 TABLET AT BEDTIME AS NEEDED FOR SLEEP (Patient taking differently: Take 50 mg by mouth at bedtime.) 90 tablet 0   diclofenac Sodium (VOLTAREN) 1 % GEL APPLY 2 GRAMS TOPICALLY 4 (FOUR) TIMES DAILY AS NEEDED. (Patient not taking: No sig reported) 100 g 0   gabapentin (NEURONTIN) 100 MG capsule Take one at night for a few days then increase to one twice a day. (Patient not taking: No sig reported) 60 capsule 0   No current facility-administered medications for this visit.    Allergies:   Patient has no known allergies.    Social History:  The  patient  reports that she quit smoking about 10 years ago. Her smoking use included cigarettes. She has never used smokeless tobacco. She reports current alcohol use. She reports that she does not use drugs.   Family History:  The patient's family history includes Breast cancer in her cousin; Cancer (age of onset: 11) in her brother; Cirrhosis in her sister; Colon cancer in her mother; Diabetes in her brother; Irritable bowel syndrome in her mother; Kidney disease in her brother; Prostate cancer in her father; Prostate cancer (age of onset: 95) in her brother; Stroke in her brother.    ROS:  Please see the history of present illness.   Otherwise, review of systems are positive for none.   All other systems are reviewed and negative.    PHYSICAL EXAM: VS: 160/45mHg and HR 68 GEN: Well nourished, well developed in no acute distress HEENT: Normal NECK: No JVD; No carotid bruits LYMPHATICS: No lymphadenopathy CARDIAC:RRR, no murmurs, rubs, gallops RESPIRATORY:  Clear to auscultation without rales, wheezing or rhonchi  ABDOMEN: Soft, non-tender, non-distended MUSCULOSKELETAL:  No edema; No deformity  SKIN: Warm and dry NEUROLOGIC:  Alert and oriented x 3 PSYCHIATRIC:  Normal affect    EKG:  EKG is ordered today and showed NSR at 68bpm with lateral T wave abnormality  Recent Labs: 04/15/2021: ALT 13; BUN 23; Creatinine, Ser 1.26; Hemoglobin 11.0; Platelets 224; Potassium 4.5; Sodium 136    Lipid Panel    Component Value Date/Time   CHOL 223 (H) 12/31/2020 1010   TRIG 170 (H) 12/31/2020 1010   HDL 81 12/31/2020 1010   CHOLHDL 2.8 12/31/2020 1010   CHOLHDL 2.8 04/28/2016 1028   VLDL 19 04/28/2016 1028   LDLCALC 113 (H) 12/31/2020 1010   LDLDIRECT 101.7 07/18/2011 0901      Wt Readings from Last 3 Encounters:  04/27/21 246 lb (111.6 kg)  03/29/21 236 lb 6.4 oz (107.2 kg)  12/31/20 231 lb 6.4 oz (105 kg)      ASSESSMENT AND  PLAN:  1. ASCAD  -s/p CABG with SVG to D2 and SVG  to PDA with no angina.   -She denies any anginal symptoms since I saw her last -Continue ASA/statin  4.  Papillary fibroelastoma of the MV  -s/p remote resection and MV repair.  -2D echo 03/03/2020 showed normal LV function with EF 60 to 65% with grade 2 diastolic dysfunction and stable mitral valve repair with no significant stenosis or regurgitation  3.  HTN  -BP is poorly controlled on exam today -Continue prescription drug management with amlodipine 10 mg daily, carvedilol 12.5 mg twice daily, losartan 100 mg daily and HCTZ 25 mg daily with as needed refills  -increase carvedilol to 41m BID -if BP remains elevated may need to stop HCTZ and add chlorthalidone -Check bmet  -followup with PharmD in 2 weeks  4.  Aortic stenosis -mild by echo in 2016 -repeat echo 02-2020 showed no aortic stenosis  5.  Hyperlipidemia -LDL goal less than 70 -Check FLP and ALT -Continue prescription drug management with atorvastatin 80 mg daily with as needed refills  Current medicines are reviewed at length with the patient today.  The patient does not have concerns regarding medicines.  The following changes have been made:  no change  Labs/ tests ordered today: See above Assessment and Plan No orders of the defined types were placed in this encounter.  Disposition:   FU in 6 months  Signed, TFransico Him MD  04/27/2021 10:59 AM    [facility] [address] [place]  [address]Phone: [phone] Fax: [phone]

## 2021-04-29 ENCOUNTER — Telehealth: Payer: Self-pay | Admitting: Pharmacist

## 2021-04-29 DIAGNOSIS — E1169 Type 2 diabetes mellitus with other specified complication: Secondary | ICD-10-CM

## 2021-04-29 DIAGNOSIS — E669 Obesity, unspecified: Secondary | ICD-10-CM

## 2021-04-29 IMAGING — DX DG LUMBAR SPINE COMPLETE 4+V
5 series · 5 of 5 positions shown · non-contrast
Comparison: None.

CLINICAL DATA: Lower back pain, lumbar laminectomy decompression
0625

EXAM:
LUMBAR SPINE - COMPLETE 4+ VIEW

[t lumbar spine ap]
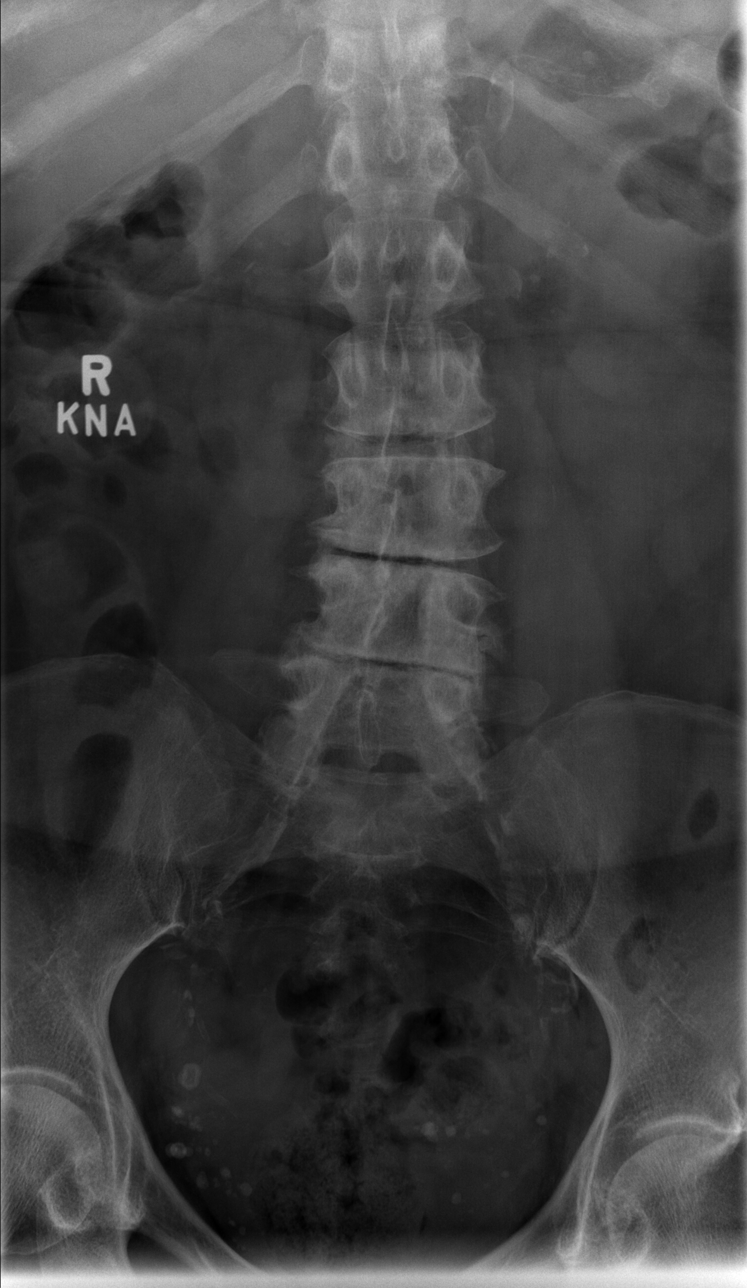

[t lumbar spine obl (1 of 2)]
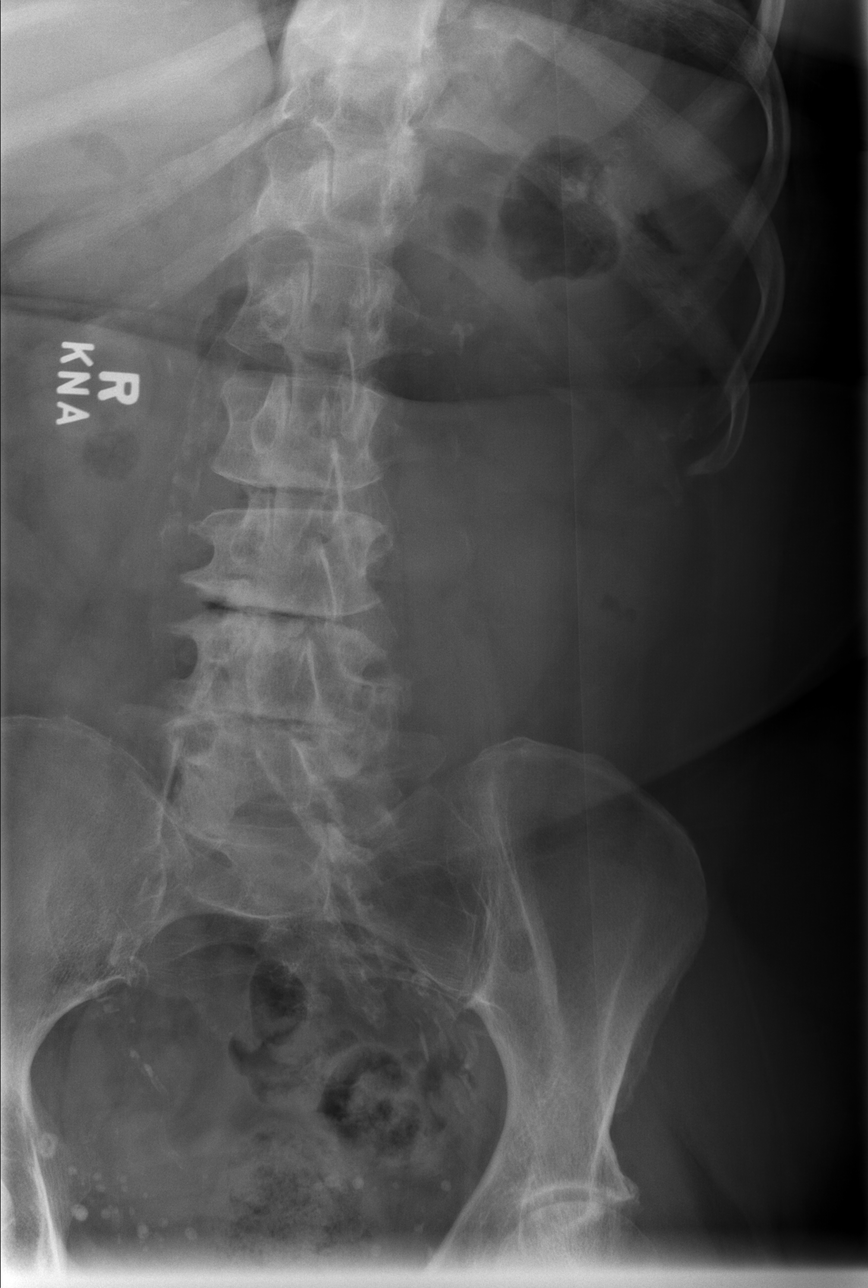

[t lumbar spine obl (2 of 2)]
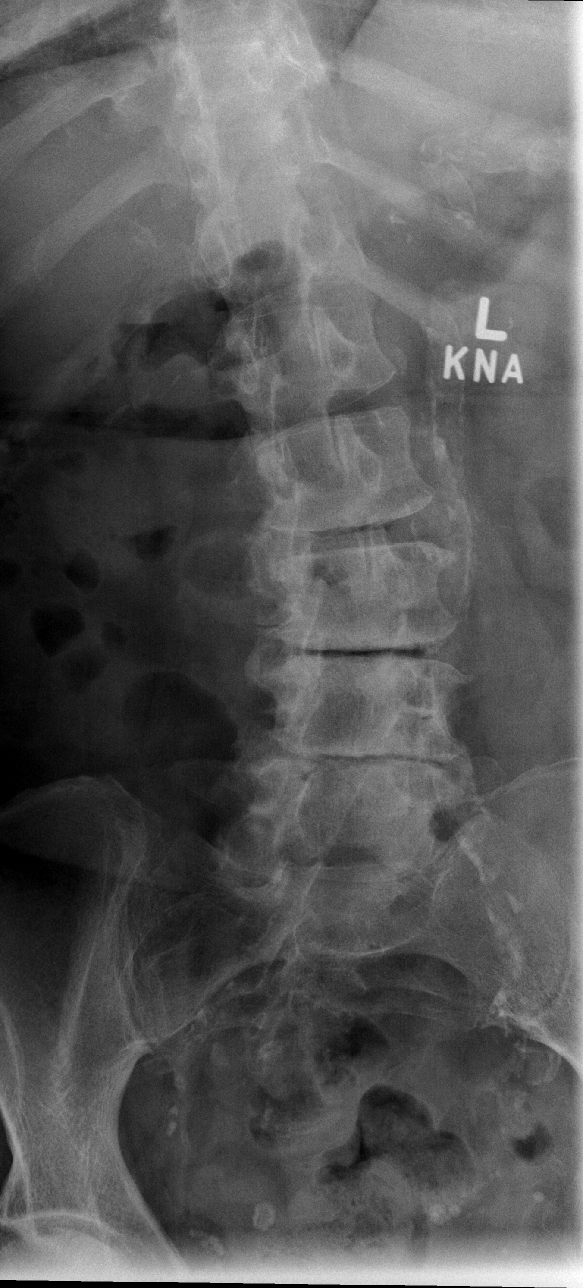

[t lumbar spine lat]
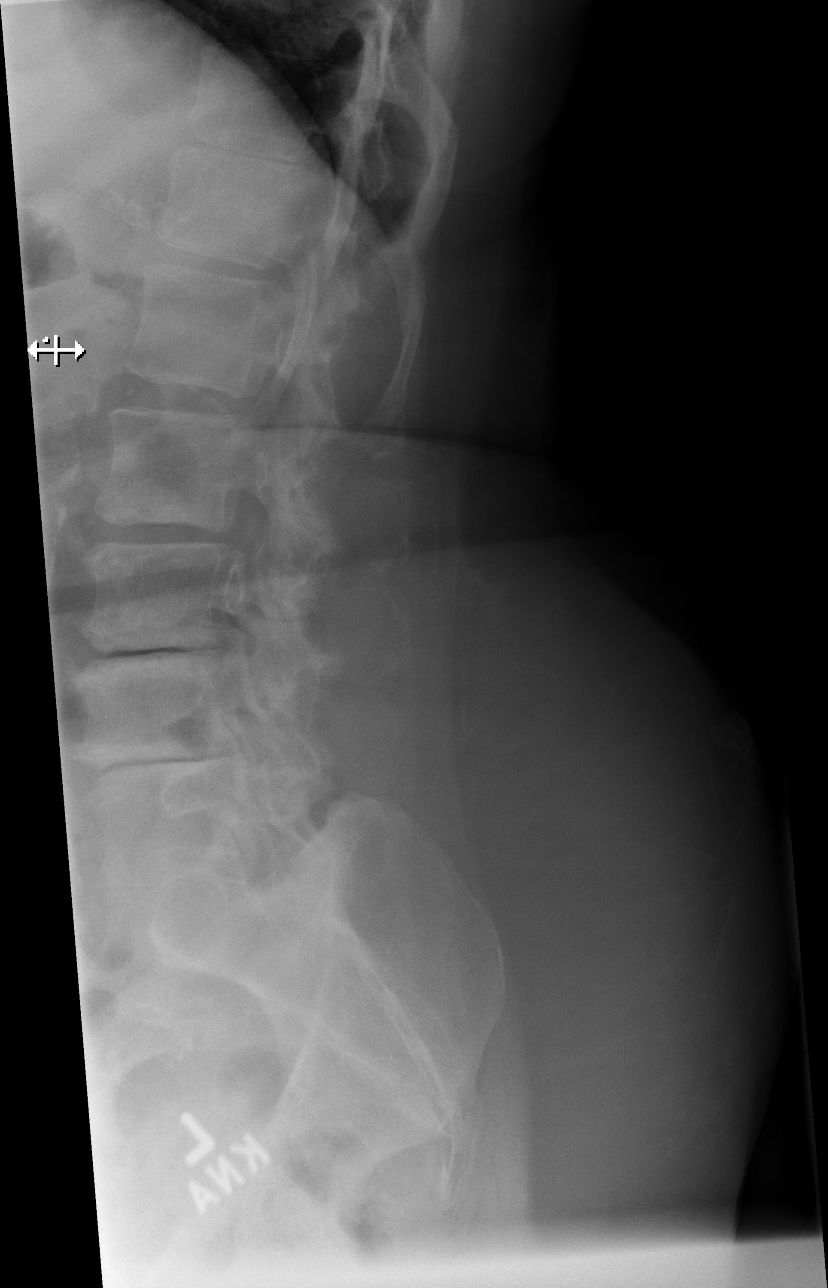

[t lumbar l-5 s-1 spot]
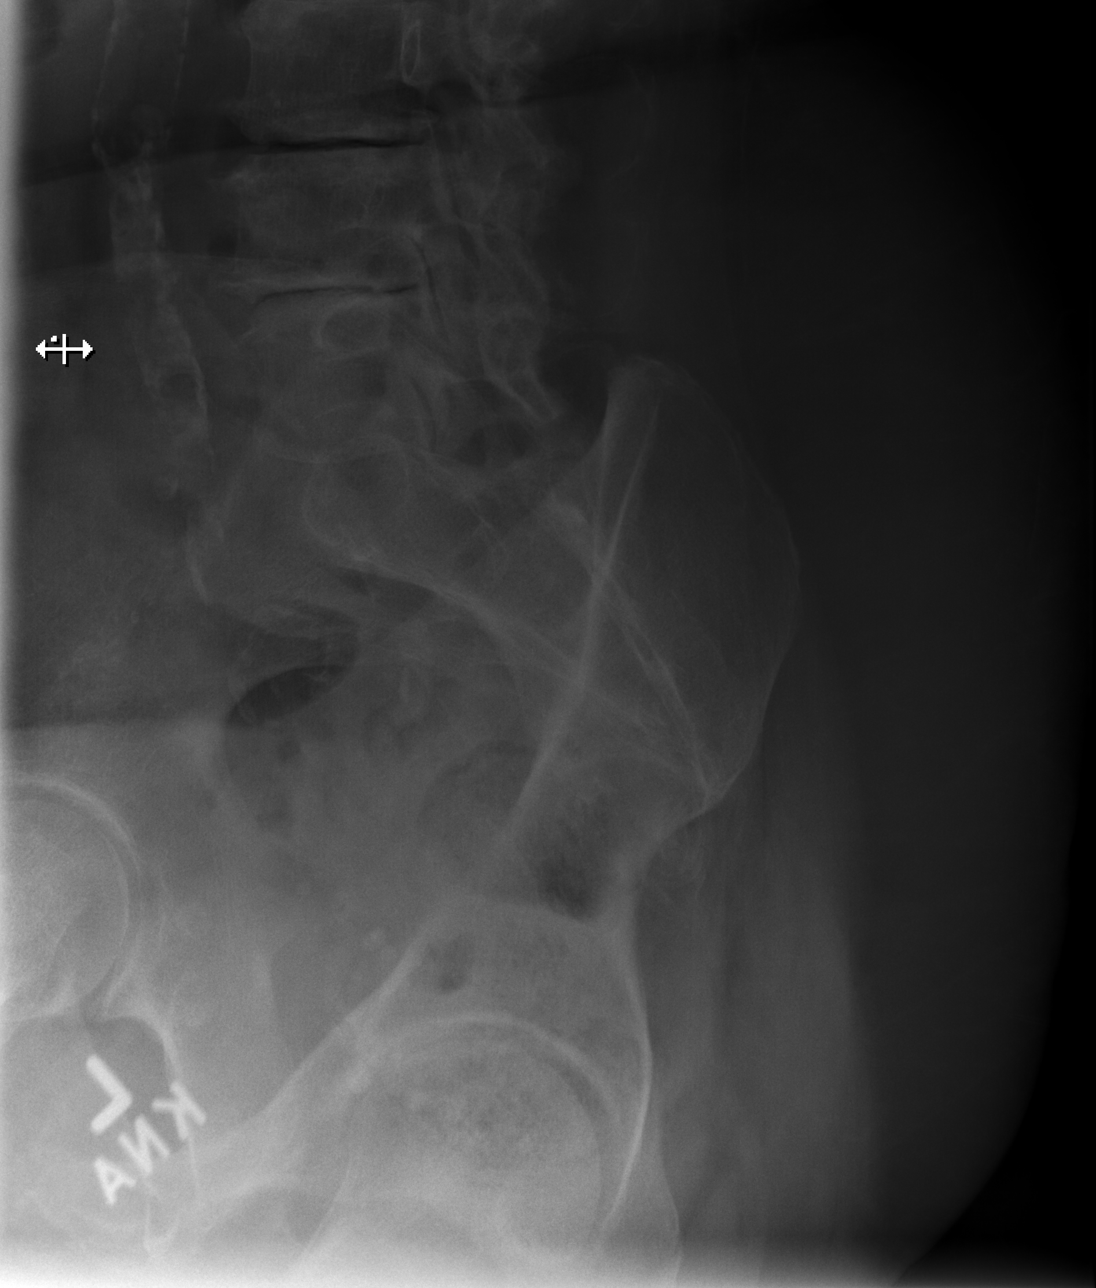

[5 of 5 positions shown; findings below may reference images not displayed]

FINDINGS: There is slight straightening of the normal lumbar lordosis. There
is a minimal leftward curvature of the lower lumbar spine. No
fracture is seen. Disc height loss with vacuum phenomenon most
notable at L3-L4 and L4-L5. Endplate sclerosis with facet
arthropathy is most notable from L3 through S1. Dense vascular
calcifications are seen.
IMPRESSION: Lumbar spine spondylosis most notable from L3 through S1.

## 2021-04-29 NOTE — Telephone Encounter (Signed)
Patient started metformin 500 mg BID Monday (10/31). I called and made a lab appt for her to come in and get a BMP checked next week (11/7).

## 2021-05-03 ENCOUNTER — Other Ambulatory Visit: Payer: Medicare PPO

## 2021-05-07 NOTE — Addendum Note (Signed)
Addended by: Patterson Hammersmith A on: 05/07/2021 02:56 PM   Modules accepted: Orders

## 2021-05-10 ENCOUNTER — Other Ambulatory Visit: Payer: Medicare PPO

## 2021-05-10 ENCOUNTER — Ambulatory Visit: Payer: Medicare PPO

## 2021-05-10 NOTE — Progress Notes (Deleted)
Patient ID: [name]                 DOB: 11/04/1950                      MRN: [id number]     HPI: [name] is a 69 y.o. female referred by Dr. Turner to HTN clinic. 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 Owen in 2012.  She also has a history of HTN, DM, dyslipidemia and TIA.  At last visit with Dr. Turner, BP was 160/82. Carvedilol was increased to 25mg twice a day.   Compliance? Dizziness, lightheadedness, headache, blurred vision, SOB, swelling Change losartan or HCTZ Spiro Needs BMP (ordered by traci) Home readings Decongestants, NSAIDS? Diet  Exercise Intolerances?  Current HTN meds: amlodipine 10 mg daily, carvedilol 25 mg twice daily, losartan 100 mg daily and HCTZ 25 mg daily  Previously tried:  BP goal: <130/80  Family History: The patient's family history includes Breast cancer in her cousin; Cancer (age of onset: 60) in her brother; Cirrhosis in her sister; Colon cancer in her mother; Diabetes in her brother; Irritable bowel syndrome in her mother; Kidney disease in her brother; Prostate cancer in her father; Prostate cancer (age of onset: 65) in her brother; Stroke in her brother.   Social History: The patient  reports that she quit smoking about 10 years ago. Her smoking use included cigarettes. She has never used smokeless tobacco. She reports current alcohol use. She reports that she does not use drugs  Diet:   Exercise:   Home BP readings:   Wt Readings from Last 3 Encounters:  04/27/21 246 lb (111.6 kg)  03/29/21 236 lb 6.4 oz (107.2 kg)  12/31/20 231 lb 6.4 oz (105 kg)   BP Readings from Last 3 Encounters:  04/27/21 (!) 160/82  04/15/21 (!) 174/76  03/29/21 (!) 165/84   Pulse Readings from Last 3 Encounters:  04/27/21 68  04/15/21 65  03/29/21 79    Renal function: CrCl cannot be calculated (Patient's  most recent lab result is older than the maximum 21 days allowed.).  Past Medical History:  Diagnosis Date   Abscess in epidural space of L2-L5 lumbar spine 03/2006   Anemia, iron deficiency    Arthritis    Breast cancer ([facility]) 06/12/2017   right breast   CAD (coronary artery disease), native coronary artery 03/30/2015   Cataract    Colon polyp    a. Multiple colonic polyps status post colonoscopy in October 2007, consistent with tubular adenoma, tubulovillous adenoma with no high-grade dysplasia or malignancy identified.    Constipation    Coronary artery disease    a. s/p CABG 06/2010: S-D2; S-PDA (at time of MV surgery).   Depression    Diabetes mellitus    type II   Family history of breast cancer    Family history of prostate cancer    Genetic testing 09/25/2017   Multi-Cancer panel (83 genes) @ [facility] - No pathogenic mutations detected   GERD (gastroesophageal reflux disease)    Hx of transient ischemic attack (TIA)    a. See stroke section.   Hyperlipidemia    Hypertension    Hypoxia    a. Has history of acute hypoxic respiratory failure in the setting of bronchitis/PNA or prior admissions.   MRSA infection      a. History of recurrent skin infection and soft tissue abscesses, with MRSA positive in the past.   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 mitral valve - s/p resection and MV repair 06/2010 Dr. Owen.   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 Distal R MCA.   Stroke ([facility])    a. 11/2009: mitral mass diagnosed at this time, also has distal R MCA stenosis, tx with coumadin. b. Readmitted 05/2010 with TIA symptoms - had not been taking Coumadin. s/p MV surgery 06/2010. Coumadin stopped 2013 after review of chart by Dr. Crenshaw since mass was removed (stroke felt possibly related to this).     Current Outpatient Medications on File Prior to  Visit  Medication Sig Dispense Refill   Accu-Chek Softclix Lancets lancets Use as instructed 100 each 12   albuterol (VENTOLIN HFA) 108 (90 Base) MCG/ACT inhaler Inhale 2 puffs into the lungs every 6 (six) hours as needed for wheezing or shortness of breath. 1 each 2   amLODipine (NORVASC) 10 MG tablet Take 1 tablet (10 mg total) by mouth daily. 90 tablet 0   Ascorbic Acid (VITAMIN C PO) Take 500 mg by mouth daily.     aspirin EC 81 MG tablet Take 1 tablet (81 mg total) by mouth daily. 30 tablet 5   atorvastatin (LIPITOR) 80 MG tablet Take 1 tablet (80 mg total) by mouth daily. 90 tablet 0   Blood Glucose Monitoring Suppl (ACCU-CHEK AVIVA PLUS) w/Device KIT Use as directed 1 kit 0   buPROPion (WELLBUTRIN XL) 150 MG 24 hr tablet Take 1 tablet (150 mg total) by mouth 2 (two) times daily. 180 tablet 2   carvedilol (COREG) 25 MG tablet Take 1 tablet (25 mg total) by mouth 2 (two) times daily. 180 tablet 3   cholecalciferol (VITAMIN D3) 25 MCG (1000 UNIT) tablet Take 1,000 Units by mouth daily.     diclofenac Sodium (VOLTAREN) 1 % GEL APPLY 2 GRAMS TOPICALLY 4 (FOUR) TIMES DAILY AS NEEDED. (Patient not taking: No sig reported) 100 g 0   ferrous sulfate (FEROSUL) 325 (65 FE) MG tablet TAKE 1 TABLET (325 MG TOTAL) BY MOUTH DAILY WITH BREAKFAST. (Patient taking differently: Take 325 mg by mouth daily. TAKE 1 TABLET (325 MG TOTAL) BY MOUTH DAILY WITH BREAKFAST.) 90 tablet 0   gabapentin (NEURONTIN) 100 MG capsule Take one at night for a few days then increase to one twice a day. (Patient not taking: No sig reported) 60 capsule 0   glucose blood (ACCU-CHEK AVIVA PLUS) test strip Use as instructed 100 each 12   hydrochlorothiazide (HYDRODIURIL) 25 MG tablet TAKE 1 TABLET EVERY DAY (Patient taking differently: Take 25 mg by mouth daily.) 90 tablet 1   insulin aspart (NOVOLOG FLEXPEN) 100 UNIT/ML FlexPen Inj 4 units subcut with the 2 larges meals of the day 15 mL 11   insulin glargine (LANTUS) 100 UNIT/ML  Solostar Pen Inject 10 Units into the skin at bedtime. 15 mL 11   Insulin Pen Needle (PEN NEEDLES) 31G X 8 MM MISC UAD 100 each 6   letrozole (FEMARA) 2.5 MG tablet TAKE 1 TABLET EVERY DAY (Patient taking differently: Take 2.5 mg by mouth daily.) 30 tablet 0   losartan (COZAAR) 100 MG tablet Take 1 tablet (100 mg total) by mouth daily. 90 tablet 3   metFORMIN (  GLUCOPHAGE) 500 MG tablet Take 1 tablet (500 mg total) by mouth 2 (two) times daily with a meal. 60 tablet 2   methocarbamol (ROBAXIN) 500 MG tablet TAKE 1 TABLET (500 MG TOTAL) BY MOUTH EVERY 8 (EIGHT) HOURS AS NEEDED FOR MUSCLE SPASMS. 60 tablet 0   pantoprazole (PROTONIX) 40 MG tablet TAKE 1 TABLET EVERY DAY (Patient taking differently: Take 40 mg by mouth daily.) 90 tablet 3   traMADol (ULTRAM) 50 MG tablet Take 1 tablet (50 mg total) by mouth daily as needed. (Patient taking differently: Take 50 mg by mouth daily as needed for moderate pain.) 30 tablet 1   traZODone (DESYREL) 50 MG tablet TAKE 1 TABLET AT BEDTIME AS NEEDED FOR SLEEP (Patient taking differently: Take 50 mg by mouth at bedtime.) 90 tablet 0   No current facility-administered medications on file prior to visit.    No Known Allergies   Assessment/Plan:  1. Hypertension -   

## 2021-05-11 ENCOUNTER — Ambulatory Visit: Payer: Medicare PPO | Attending: Internal Medicine | Admitting: Internal Medicine

## 2021-05-11 ENCOUNTER — Other Ambulatory Visit: Payer: Self-pay

## 2021-05-11 ENCOUNTER — Encounter: Payer: Self-pay | Admitting: Internal Medicine

## 2021-05-11 VITALS — BP 187/93 | HR 64 | Resp 16 | Wt 244.8 lb

## 2021-05-11 DIAGNOSIS — N1831 Chronic kidney disease, stage 3a: Secondary | ICD-10-CM | POA: Diagnosis not present

## 2021-05-11 DIAGNOSIS — E669 Obesity, unspecified: Secondary | ICD-10-CM | POA: Diagnosis not present

## 2021-05-11 DIAGNOSIS — R250 Abnormal head movements: Secondary | ICD-10-CM | POA: Diagnosis not present

## 2021-05-11 DIAGNOSIS — E1169 Type 2 diabetes mellitus with other specified complication: Secondary | ICD-10-CM

## 2021-05-11 DIAGNOSIS — G2581 Restless legs syndrome: Secondary | ICD-10-CM | POA: Diagnosis not present

## 2021-05-11 DIAGNOSIS — I10 Essential (primary) hypertension: Secondary | ICD-10-CM | POA: Diagnosis not present

## 2021-05-11 LAB — GLUCOSE, POCT (MANUAL RESULT ENTRY): POC Glucose: 154 mg/dl — AB (ref 70–99)

## 2021-05-11 NOTE — Progress Notes (Signed)
Patient ID: [name], female    DOB: 01-Aug-1950  MRN: [id number]  CC: Diabetes and Hypertension   Subjective: [name] is a 70 y.o. female who presents for chronic ds management Her concerns today include:  Pt with hx of HTN, DM, HL, depression, chronic back pain, RT breast CA (intraductal CA, ER/PR+, lumpectomy and XRT - completed 08/2017), CKD stage 3, CVA 2011 (residual weakness on LT side), CAD s/p CABG and MVR/resection of fibroelastoma, IDA.   Ever since she had stroke, has bobbing/twitching of head.  When in bed she has to constantly move her legs.  Hx of IDA.  Takes iron consistently per her report.  Last ordered by me 04/2020 for 90 days.  She tells me that she purchased it on her own.  Last H&H done about 3 weeks ago was 11/35.6. Was on Gabapentin last yr by Dr. [name].  Currently not taking  DM: Results for orders placed or performed in visit on 05/11/21  POCT glucose (manual entry)  Result Value Ref Range   POC Glucose 154 (A) 70 - 99 mg/dl   Lab Results  Component Value Date   HGBA1C 10.0 (H) 04/05/2021  -After last visit with me, creatinine was found to be elevated at 2.15 with GFR of 24.  I had her discontinue metformin and glipizide and started her on long and short acting insulin.  There has been some concern about cost.  She gets her medicines through [facility].  She tells me the generic NovoLog is not too costly but the glargine insulin is about $100.  She is getting both in the pen form.  I sent prescriptions to [facility] for both of these medicines for her.  She has not touch base with her insurance about whether these would be cheaper through mail order and she has not received either of them through the mail as yet.   -Saw the clinical pharmacist and his student since last visit with me.  She was restarted on metformin at a low dose of 500 mg twice a day.  Plan was to check BMP today. -Checks blood sugars in the mornings before breakfast.  She gives  a range of 117-139.  She is taking Lantus insulin 10 units at bedtime and NovoLog 4 units with the 2 largest meals of the day.  She reports that sometimes she does not get to take the NovoLog because she never carries the pen with her.  She thinks that the pen needs to continue being refrigerated. Appetite not as good as she would like.  Has up coming appt with nutritionist  HTN/CAD:  She has seen the cardiologist Dr. Radford Pax since last visit with me.  She does not have her medications with her today.  She reports compliance with taking atorvastatin.  I told her that I have received several notice from her insurance telling me that she has not been feeling the atorvastatin consistently.  At that point patient tells me that sometimes she was not taking it consistently. Checks BP at home.  Did not take meds as yet today Coreg increased to 25 mg BID by her cardiologist recently.  She is also supposed to be on amlodipine, Cozaar and HCTZ. Reports BP at home in the 160s. Did not get eye exam as yet. Wants to change to Dr. Wyvonnia Dusky  CKD: Plan is to recheck chemistry today.  She had chemistry done when she was seen in the emergency room at the end of last month.  Creatinine had improved to 1.26 and GFR had improved to 46. Patient Active Problem List   Diagnosis Date Noted   Controlled substance agreement signed 12/31/2020   Shoulder stiffness, left 07/07/2020   Status post lumbar spine surgery for decompression of spinal cord 06/09/2020   Lumbar stenosis 05/27/2020   Spinal stenosis of lumbar region 05/21/2019   Complete tear of left rotator cuff 05/16/2019   Uncontrolled type 2 diabetes mellitus with hyperglycemia ([place]) 12/19/2018   Microalbuminuria 04/08/2018   Genetic testing 09/25/2017   Family history of breast cancer    Family history of prostate cancer    Malignant neoplasm of upper-outer quadrant of right breast in female, estrogen receptor positive ([place]) 06/26/2017   Lumbar herniated disc  04/15/2015   Abnormal nuclear stress test 04/03/2015   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 1 each 2   amLODipine (NORVASC) 10 MG tablet Take 1 tablet (10 mg total) by mouth daily. 90 tablet 0   Ascorbic Acid (VITAMIN C PO) Take 500 mg by mouth daily.     aspirin EC 81 MG tablet Take 1 tablet (81 mg total) by mouth daily. 30 tablet 5   atorvastatin (LIPITOR) 80 MG tablet Take 1 tablet (80 mg total) by mouth daily. 90 tablet 0   Blood Glucose Monitoring Suppl (ACCU-CHEK AVIVA PLUS) w/Device KIT Use as directed 1 kit 0   buPROPion (WELLBUTRIN XL) 150 MG 24 hr tablet Take 1 tablet (150 mg total) by mouth 2 (two) times daily. 180 tablet 2   carvedilol (COREG) 25 MG tablet Take 1 tablet (25 mg total) by mouth 2 (two) times daily. 180 tablet 3   cholecalciferol (VITAMIN D3) 25 MCG (1000 UNIT) tablet Take 1,000 Units by mouth daily.     diclofenac Sodium (VOLTAREN) 1 % GEL APPLY 2 GRAMS TOPICALLY 4 (FOUR) TIMES DAILY AS NEEDED. (Patient not taking: No sig reported) 100 g 0   ferrous sulfate (FEROSUL) 325 (65 FE) MG tablet TAKE 1 TABLET (325 MG TOTAL) BY MOUTH DAILY WITH BREAKFAST. (Patient taking differently: Take 325 mg by mouth daily. TAKE 1 TABLET (325 MG TOTAL) BY MOUTH DAILY WITH BREAKFAST.) 90 tablet 0   gabapentin (NEURONTIN) 100 MG capsule Take one at night for a few days then increase to one twice a day. (Patient not taking: No sig reported) 60 capsule  0   glucose blood (ACCU-CHEK AVIVA PLUS) test strip Use as instructed 100 each 12   hydrochlorothiazide (HYDRODIURIL) 25 MG tablet TAKE 1 TABLET EVERY DAY (Patient taking differently: Take 25 mg by mouth daily.) 90 tablet 1   insulin aspart (NOVOLOG FLEXPEN) 100 UNIT/ML FlexPen Inj 4 units subcut with the 2 larges meals of the day 15 mL 11   insulin glargine (LANTUS) 100 UNIT/ML Solostar Pen Inject 10 Units into the skin at bedtime. 15 mL 11   Insulin Pen Needle (PEN NEEDLES) 31G X 8 MM MISC UAD 100 each 6   letrozole (FEMARA) 2.5 MG  tablet TAKE 1 TABLET EVERY DAY (Patient taking differently: Take 2.5 mg by mouth daily.) 30 tablet 0   losartan (COZAAR) 100 MG tablet Take 1 tablet (100 mg total) by mouth daily. 90 tablet 3   metFORMIN (GLUCOPHAGE) 500 MG tablet Take 1 tablet (500 mg total) by mouth 2 (two) times daily with a meal. 60 tablet 2   methocarbamol (ROBAXIN) 500 MG tablet TAKE 1 TABLET (500 MG TOTAL) BY MOUTH EVERY 8 (EIGHT) HOURS AS NEEDED FOR MUSCLE SPASMS. 60 tablet 0   pantoprazole (PROTONIX) 40 MG tablet TAKE 1 TABLET EVERY DAY (Patient taking differently: Take 40 mg by mouth daily.) 90 tablet 3   traMADol (ULTRAM) 50 MG tablet Take 1 tablet (50 mg total) by mouth daily as needed. (Patient taking differently: Take 50 mg by mouth daily as needed for moderate pain.) 30 tablet 1   traZODone (DESYREL) 50 MG tablet TAKE 1 TABLET AT BEDTIME AS NEEDED FOR SLEEP (Patient taking differently: Take 50 mg by mouth at bedtime.) 90 tablet 0   No current facility-administered medications on file prior to visit.    No Known Allergies  Social History   Socioeconomic History   Marital status: Married    Spouse name: Not on file   Number of children: 3   Years of education: Not on file   Highest education level: Not on file  Occupational History    Employer: UNEMPLOYED  Tobacco Use   Smoking status: Former    Types: Cigarettes    Quit date: 10/14/2010    Years since quitting: 10.5    Smokeless tobacco: Never  Vaping Use   Vaping Use: Never used  Substance and Sexual Activity   Alcohol use: Yes    Comment: social   Drug use: No   Sexual activity: Not Currently  Other Topics Concern   Not on file  Social History Narrative   Lives with husband, stay at home, uses cane occasionally, still active/ambulatory.    Social Determinants of Health   Financial Resource Strain: Not on file  Food Insecurity: Not on file  Transportation Needs: Not on file  Physical Activity: Not on file  Stress: Not on file  Social Connections: Not on file  Intimate Partner Violence: Not on file    Family History  Problem Relation Age of Onset   Colon cancer Mother        dx 56s; deceased 77   Irritable bowel syndrome Mother    Prostate cancer Father        deceased 60s   Cirrhosis Sister        died of GI bleed associated with cirrhosis of the liver   Stroke Brother    Prostate cancer Brother 3       deceased 8   Diabetes Brother        x3   Kidney disease Brother    Cancer Brother 60       unk. type   Breast cancer Cousin        several maternal 1st and 2nd cousins with breast cancer    Past Surgical History:  Procedure Laterality Date   BREAST BIOPSY Right 08/26/2019   benign   BREAST LUMPECTOMY Right    BREAST LUMPECTOMY WITH RADIOACTIVE SEED AND SENTINEL LYMPH NODE BIOPSY Right 07/20/2017   Procedure: RIGHT BREAST LUMPECTOMY WITH RADIOACTIVE SEED AND SENTINEL LYMPH NODE BIOPSY;  Surgeon: Alphonsa Overall, MD;  Location: [facility];  Service: General;  Laterality: Right;   CARDIAC CATHETERIZATION  04/03/2015   Procedure: Left Heart Cath and Cors/Grafts Angiography;  Surgeon: Peter M Martinique, MD;  Location: [facility];  Service: Cardiovascular;;   CORONARY ARTERY BYPASS GRAFT  1/12   history of ankle fractures requiring surgery     LUMBAR LAMINECTOMY/DECOMPRESSION MICRODISCECTOMY Left 04/15/2015   Procedure: LUMBAR LAMINECTOMY/DECOMPRESSION MICRODISCECTOMY 1 LEVEL;   Surgeon: Newman Pies, MD;  Location: [facility] [place];  Service: Neurosurgery;  Laterality: Left;  Left L23 microdiskectomy   LUMBAR LAMINECTOMY/DECOMPRESSION MICRODISCECTOMY N/A 05/27/2020   Procedure: L2-3, L3-4  decompression;  Surgeon: [name], MD;  Location: [facility];  Service: Orthopedics;  Laterality: N/A;   MV repair and resection of mass  1/12   RESECTION DISTAL CLAVICAL Left 05/16/2019   Procedure: RESECTION DISTAL CLAVICAL;  Surgeon: Mcarthur Rossetti, MD;  Location: [facility];  Service: Orthopedics;  Laterality: Left;   SHOULDER ARTHROSCOPY WITH ROTATOR CUFF REPAIR AND SUBACROMIAL DECOMPRESSION Left 05/16/2019   Procedure: SHOULDER ARTHROSCOPY WITH ROTATOR CUFF REPAIR AND SUBACROMIAL DECOMPRESSION;  Surgeon: Mcarthur Rossetti, MD;  Location: [facility];  Service: Orthopedics;  Laterality: Left;   TONSILLECTOMY  1971   TOTAL ABDOMINAL HYSTERECTOMY  1978   VESICOVAGINAL FISTULA CLOSURE W/ TAH      ROS: Review of Systems Negative except as stated above  PHYSICAL EXAM: BP (!) 187/93   Pulse 64   Resp 16   Wt 244 lb 12.8 oz (111 kg)   SpO2 99%   BMI 37.22 kg/m   Physical Exam  General appearance - alert, well appearing, and in no distress Mental status - normal mood, behavior, speech, dress, motor activity, and thought processes Neck - supple, no significant adenopathy Chest - clear to auscultation, no wheezes, rales or rhonchi, symmetric air entry Heart - normal rate, regular rhythm, normal S1, S2, no murmurs, rubs, clicks or gallops Neurological -patient has slight intermittent bobbing movements of the head. Extremities - peripheral pulses normal, no pedal edema, no clubbing or cyanosis   CMP Latest Ref Rng & Units 04/15/2021 04/05/2021 12/31/2020  Glucose 70 - 99 mg/dL 127(H) 186(H) 175(H)  BUN 8 - 23 mg/dL $Remove'23 21 14  'RDKpKFA$ Creatinine 0.44 - 1.00 mg/dL 1.26(H) 2.15(H) 1.21(H)  Sodium 135 - 145 mmol/L 136 137 142  Potassium 3.5 -  5.1 mmol/L 4.5 4.9 4.5  Chloride 98 - 111 mmol/L 105 99 103  CO2 22 - 32 mmol/L $RemoveB'23 25 23  'rtfDWepC$ Calcium 8.9 - 10.3 mg/dL 8.6(L) 9.4 9.5  Total Protein 6.5 - 8.1 g/dL 6.6 - 6.4  Total Bilirubin 0.3 - 1.2 mg/dL 1.2 - 0.4  Alkaline Phos 38 - 126 U/L 54 - 70  AST 15 - 41 U/L 17 - 17  ALT 0 - 44 U/L 13 - 19   Lipid Panel     Component Value Date/Time   CHOL 223 (H) 12/31/2020 1010   TRIG 170 (H) 12/31/2020 1010   HDL 81 12/31/2020 1010   CHOLHDL 2.8 12/31/2020 1010   CHOLHDL 2.8 04/28/2016 1028   VLDL 19 04/28/2016 1028   LDLCALC 113 (H) 12/31/2020 1010   LDLDIRECT 101.7 07/18/2011 0901    CBC    Component Value Date/Time   WBC 12.1 (H) 04/15/2021 1929   RBC 4.29 04/15/2021 1929   HGB 11.0 (L) 04/15/2021 1929   HGB 11.9 12/31/2020 1010   HCT 35.6 (L) 04/15/2021 1929   HCT 37.4 12/31/2020 1010   PLT 224 04/15/2021 1929   PLT 267 12/31/2020 1010   MCV  83.0 04/15/2021 1929   MCV 80 12/31/2020 1010   MCH 25.6 (L) 04/15/2021 1929   MCHC 30.9 04/15/2021 1929   RDW 14.9 04/15/2021 1929   RDW 14.6 12/31/2020 1010   LYMPHSABS 1.1 04/15/2021 1929   MONOABS 0.5 04/15/2021 1929   EOSABS 0.0 04/15/2021 1929   BASOSABS 0.1 04/15/2021 1929    ASSESSMENT AND PLAN: 1. Type 2 diabetes mellitus with obesity ([place]) -Encourage healthy eating habits.  She will keep upcoming appointment with nutritionist. -Strongly encouraged her to speak with her insurance and find out whether or not the Lantus insulin is the preferred long-acting and if so whether it would be cheaper for her to get it through mail order.  If it is not the preferred agent then she should find out from them what it is.  If she is not able to get this information then we will need to change her to a cheaper form of insulin like 70/30.  She expressed understanding and states she will get back to me. -Informed patient that when she takes a NovoLog pen out-of-the-box, the pen that she is using does not have to be refrigerated again but  the remaining 4 pens would need to be kept refrigerated. - POCT glucose (manual entry) - Ambulatory referral to Ophthalmology  2. Essential hypertension Uncontrolled.  I question compliance with medications.  Stressed the importance of compliance.  She will take her medicines as soon as she returns home.  Follow-up with clinical pharmacist in 2 weeks for repeat blood pressure check.  3. Stage 3a chronic kidney disease ([place]) - Basic Metabolic Panel  4. RLS (restless legs syndrome) Advised patient that a common cause of restless leg syndrome is iron deficiency anemia.  Advised her to take the iron.  If no improvement despite taking iron and then we can put her back on gabapentin or try her with Requip.  5. Abnormal head movements - Ambulatory referral to Neurology    Patient was given the opportunity to ask questions.  Patient verbalized understanding of the plan and was able to repeat key elements of the plan.   Orders Placed This Encounter  Procedures   Basic Metabolic Panel   Ambulatory referral to Neurology   Ambulatory referral to Ophthalmology   POCT glucose (manual entry)     Requested Prescriptions    No prescriptions requested or ordered in this encounter    Return in about 4 months (around 09/08/2021) for Give appt with [facility] in 2 wks for BP recheck.  Karle Plumber, MD, FACP

## 2021-05-11 NOTE — Patient Instructions (Signed)
Please call your insurance and ask them which long-acting insulin is on their formulary and would be the cheapest for you.  Please let me know so that I can change the glargine/Lantus insulin to whichever is the preferred insulin for your insurance.  Please take your blood pressure medications as soon as you return home.  Follow-up with the clinical pharmacist in 2 weeks for repeat blood pressure check.

## 2021-05-12 ENCOUNTER — Ambulatory Visit: Payer: Medicare PPO | Admitting: Pharmacist

## 2021-05-12 ENCOUNTER — Telehealth: Payer: Self-pay

## 2021-05-12 LAB — BASIC METABOLIC PANEL
BUN/Creatinine Ratio: 14 (ref 12–28)
BUN: 19 mg/dL (ref 8–27)
CO2: 25 mmol/L (ref 20–29)
Calcium: 9.4 mg/dL (ref 8.7–10.3)
Chloride: 102 mmol/L (ref 96–106)
Creatinine, Ser: 1.36 mg/dL — ABNORMAL HIGH (ref 0.57–1.00)
Glucose: 158 mg/dL — ABNORMAL HIGH (ref 70–99)
Potassium: 4.9 mmol/L (ref 3.5–5.2)
Sodium: 140 mmol/L (ref 134–144)
eGFR: 42 mL/min/{1.73_m2} — ABNORMAL LOW (ref >59)

## 2021-05-12 NOTE — Telephone Encounter (Signed)
Contacted pt to go over lab results pt wasn't available   Sent a CRM and forward labs to NT to give pt labs when they call back

## 2021-05-17 ENCOUNTER — Telehealth: Payer: Self-pay

## 2021-05-17 NOTE — Telephone Encounter (Signed)
Contacted pt to go over lab results pt is aware pt had some questions in regards to her insulin. Contacted Luke to see if he is able to speak with pt in regards to this. Pt was transferred to [facility]

## 2021-05-18 ENCOUNTER — Other Ambulatory Visit: Payer: Self-pay | Admitting: Internal Medicine

## 2021-05-18 ENCOUNTER — Other Ambulatory Visit: Payer: Self-pay | Admitting: Hematology and Oncology

## 2021-05-18 DIAGNOSIS — F32A Depression, unspecified: Secondary | ICD-10-CM

## 2021-05-18 NOTE — Telephone Encounter (Signed)
Last rx was sent in 01/20/20

## 2021-05-24 ENCOUNTER — Other Ambulatory Visit: Payer: Self-pay

## 2021-05-24 ENCOUNTER — Encounter: Payer: Medicare PPO | Attending: Internal Medicine | Admitting: Registered"

## 2021-05-24 ENCOUNTER — Encounter: Payer: Self-pay | Admitting: Registered"

## 2021-05-24 VITALS — Wt 236.8 lb

## 2021-05-24 DIAGNOSIS — E1165 Type 2 diabetes mellitus with hyperglycemia: Secondary | ICD-10-CM | POA: Diagnosis not present

## 2021-05-24 NOTE — Patient Instructions (Addendum)
Great job on cutting out regular ginger ale and cutting back on sweets.  Consider using the Silver Sneakers and trying some movement in the water, water aerobics.  Pay attention to what you are eating: Use the plate method to balance carbs with protein and vegetables Pay attention to your portions.  Read through the sleep hygiene tips handout for ideas to get better sleep.  Start checking blood sugar 2 hours after some meals, goal is to be 180 or less.  Call insurance to see if they cover continuous glucose monitor (Dexcom or Freestyle [place]). If so and you want to try talk to your doctor about getting a prescription.

## 2021-05-24 NOTE — Progress Notes (Signed)
Diabetes Self-Management Education  Visit Type: First/Initial  Appt. Start Time: 1100 Appt. End Time: 1215  05/26/2021  Ms. Kaycee Haycraft, identified by name and date of birth, is a 70 y.o. female with a diagnosis of Diabetes: Type 2.   ASSESSMENT  Wt Readings from Last 3 Encounters:  05/24/21 236 lb 12.8 oz (107.4 kg)  05/11/21 244 lb 12.8 oz (111 kg)  04/27/21 246 lb (111.6 kg)   A1c 10%  Metformin 500 mg, bid; Novolog 4 units with meals bid and Lantus 10 units before bed.  Pt reports if she has appointments she doesn't take medication that makes her sleepy until she gets home.  Pt reports reduced appetite, only eats to take medication. Patient has had 9 lb weight loss this month.   Pt states drinking 1 L ginger ale daily until 2 months ago, but now drinks sugar free or Ice, and drinking more water. Pt states she is disappointed because not losing weight. (Patient has lost weight, but did know until at end of visit).  Pt states sometimes stops at restaurant to get some food in the morning to have something on stomach to take medication: coffee 2 cream, 5 splenda, steak biscuit OR sausage mcmuffin OR oatmeal OR left overs  Pt states she drinks alcohol occasionally but when having a drink doesn't take medications.  Stress: 9/10; trying to deal with it, takes antidepressant, no self-harm thoughts. Pt states she tries to be joyful . Uses gratitude to manage stressful emotions.  Sleep: not ideal  Physical activity: Pt reports barriers including multiple surgeries, was using a cane but she is working on balance. Pt states she gets a lot of exercise with ADLs because she lives with daughter and has to climb stairs in the house   Diabetes Self-Management Education - 05/24/21 1112       Visit Information   Visit Type First/Initial      Initial Visit   Diabetes Type Type 2    Are you currently following a meal plan? No    Are you taking your medications as prescribed? Yes     Date Diagnosed 1999      Health Coping   How would you rate your overall health? Fair      Psychosocial Assessment   Patient Belief/Attitude about Diabetes Motivated to manage diabetes    How often do you need to have someone help you when you read instructions, pamphlets, or other written materials from your doctor or pharmacy? 1 - Never    What is the last grade level you completed in school? [employer]      Complications   Last HgB A1C per patient/outside source 10 %    Fasting Blood glucose range (mg/dL) 70-129   90-130   Have you had a dilated eye exam in the past 12 months? No   has appointment Dec 8   Have you had a dental exam in the past 12 months? No   looking for a good dentist   Are you checking your feet? Yes    How many days per week are you checking your feet? 7      Dietary Intake   Breakfast (10 am) chicken salad sandwich on white bread    Snack (afternoon) apple Sprite zero    Dinner (7:30 pm) mac and cheese, greens, [place]    Snack (evening) none but usually tries to eat an apple in evening to help have BM in the morning    Beverage(s) water,  coffee 2x/week, Spite zero 2-3/x week, occassionally mixed drink at social functions 1-2x/month      Exercise   Exercise Type ADL's    How many days per week to you exercise? 0    How many minutes per day do you exercise? 0    Total minutes per week of exercise 0      Patient Education   Previous Diabetes Education No    Disease state  Definition of diabetes, type 1 and 2, and the diagnosis of diabetes    Nutrition management  Role of diet in the treatment of diabetes and the relationship between the three main macronutrients and blood glucose level;Food label reading, portion sizes and measuring food.    Physical activity and exercise  Role of exercise on diabetes management, blood pressure control and cardiac health.    Monitoring Identified appropriate SMBG and/or A1C goals.;Purpose and frequency of SMBG.    Acute  complications Taught treatment of hypoglycemia - the 15 rule.    Psychosocial adjustment Role of stress on diabetes      Individualized Goals (developed by patient)   Nutrition General guidelines for healthy choices and portions discussed    Monitoring  test my blood glucose as discussed      Outcomes   Expected Outcomes Demonstrated interest in learning. Expect positive outcomes    Future DMSE 2 months    Program Status Not Completed             Individualized Plan for Diabetes Self-Management Training:   Learning Objective:  Patient will have a greater understanding of diabetes self-management. Patient education plan is to attend individual and/or group sessions per assessed needs and concerns.    Patient Instructions  Doristine Devoid job on cutting out regular ginger ale and cutting back on sweets.  Consider using the Silver Sneakers and trying some movement in the water, water aerobics.  Pay attention to what you are eating: Use the plate method to balance carbs with protein and vegetables Pay attention to your portions.  Read through the sleep hygiene tips handout for ideas to get better sleep.  Start checking blood sugar 2 hours after some meals, goal is to be 180 or less.  Call insurance to see if they cover continuous glucose monitor (Dexcom or Freestyle [place]). If so and you want to try talk to your doctor about getting a prescription.  Expected Outcomes:  Demonstrated interest in learning. Expect positive outcomes  Education material provided: ADA - How to Thrive: A Guide for Your Journey with Diabetes, Food label handouts, and A1C conversion sheet  If problems or questions, patient to contact team via:  Phone and MyChart  Future DSME appointment: 2 months

## 2021-05-25 ENCOUNTER — Ambulatory Visit: Payer: Medicare PPO | Admitting: Pharmacist

## 2021-05-25 NOTE — Progress Notes (Unsigned)
   S:    Patient arrives ***.    Presents to the clinic for hypertension evaluation, counseling, and management. Patient was referred and last seen by Primary Care Provider on 05/11/2021 where nonadherence to medications was reported. At that time. The importance of taking medications as prescribed was emphasized.  Medication adherence *** .  Current BP Medications include:   -Coreg 25 mg BID -Amlodipine 10 mg QD -HCTZ 25 mg QD -Losartan 100 mg QD  Antihypertensives tried in the past include: none  Dietary habits include: *** Exercise habits include:*** Family / Social history: ***  ASCVD risk factors include:***   O:   Home BP readings: ***  Last 3 Office BP readings: BP Readings from Last 3 Encounters:  05/11/21 (!) 187/93  04/27/21 (!) 160/82  04/15/21 (!) 174/76    BMET    Component Value Date/Time   NA 140 05/11/2021 1132   K 4.9 05/11/2021 1132   CL 102 05/11/2021 1132   CO2 25 05/11/2021 1132   GLUCOSE 158 (H) 05/11/2021 1132   GLUCOSE 127 (H) 04/15/2021 1929   BUN 19 05/11/2021 1132   CREATININE 1.36 (H) 05/11/2021 1132   CREATININE 1.17 (H) 04/28/2016 1028   CALCIUM 9.4 05/11/2021 1132   GFRNONAA 46 (L) 04/15/2021 1929   GFRNONAA 50 (L) 06/23/2015 1711   GFRAA 52 (L) 05/15/2020 1104   GFRAA 58 (L) 06/23/2015 1711    Renal function: Estimated Creatinine Clearance: 48.7 mL/min (A) (by C-G formula based on SCr of 1.36 mg/dL (H)).  Clinical ASCVD: {YES/NO:21197} The 10-year ASCVD risk score (Arnett DK, et al., 2019) is: 53.8%   Values used to calculate the score:     Age: 70 years     Sex: Female     Is Non-Hispanic African American: Yes     Diabetic: Yes     Tobacco smoker: No     Systolic Blood Pressure: 060 mmHg     Is BP treated: Yes     HDL Cholesterol: 81 mg/dL     Total Cholesterol: 223 mg/dL  PHQ-9 Score: ***   A/P: Hypertension diagnosed *** currently *** on current medications. BP Goal = < *** mmHg. Medication adherence ***.   -{Meds adjust:18428} ***.  -F/u labs ordered - *** -Counseled on lifestyle modifications for blood pressure control including reduced dietary sodium, increased exercise, adequate sleep.  Results reviewed and written information provided.   Total time in face-to-face counseling *** minutes.   F/U Clinic Visit in ***.  Patient seen with ***

## 2021-05-31 ENCOUNTER — Other Ambulatory Visit: Payer: Self-pay | Admitting: Hematology and Oncology

## 2021-05-31 ENCOUNTER — Other Ambulatory Visit: Payer: Self-pay | Admitting: Internal Medicine

## 2021-05-31 DIAGNOSIS — G8929 Other chronic pain: Secondary | ICD-10-CM

## 2021-05-31 DIAGNOSIS — E0800 Diabetes mellitus due to underlying condition with hyperosmolarity without nonketotic hyperglycemic-hyperosmolar coma (NKHHC): Secondary | ICD-10-CM

## 2021-06-22 ENCOUNTER — Ambulatory Visit: Payer: Self-pay

## 2021-06-22 NOTE — Telephone Encounter (Signed)
°  Chief Complaint: chest pain and congestion Symptoms: chest pain and congestion Frequency: 2 -3 days Pertinent Negatives: Patient denies head congestion Disposition: [] ED /[x] Urgent Care (no appt availability in office) / [] Appointment(In office/virtual)/ []  [facility]/ [] Home Care/ [] Refused Recommended Disposition  Additional Notes: Pt has went to several UC locations today and due to long wait time called in to office and was trying to get appt. Advised pt no appt available until 07/15/21. I scheduled pt for appt at [facility] for 06/23/21 at 1600. Pt states she will go to that appt tomorrow.    Reason for Disposition  [1] Chest pain(s) lasting a few seconds from coughing AND [2] persists > 3 days  Answer Assessment - Initial Assessment Questions 1. LOCATION: "Where does it hurt?"       Middle of chest 2. RADIATION: "Does the pain go anywhere else?" (e.g., into neck, jaw, arms, back)     No 3. ONSET: "When did the chest pain begin?" (Minutes, hours or days)      2 nights 4. PATTERN "Does the pain come and go, or has it been constant since it started?"  "Does it get worse with exertion?"      Only when coughing 6. SEVERITY: "How bad is the pain?"  (e.g., Scale 1-10; mild, moderate, or severe)    - MILD (1-3): doesn't interfere with normal activities     - MODERATE (4-7): interferes with normal activities or awakens from sleep    - SEVERE (8-10): excruciating pain, unable to do any normal activities       5 10. OTHER SYMPTOMS: "Do you have any other symptoms?" (e.g., dizziness, nausea, vomiting, sweating, fever, difficulty breathing, cough)       Poor appetite, labor breathing  Protocols used: Chest Pain-A-AH

## 2021-06-23 ENCOUNTER — Other Ambulatory Visit: Payer: Self-pay

## 2021-06-23 ENCOUNTER — Ambulatory Visit (HOSPITAL_COMMUNITY)
Admission: RE | Admit: 2021-06-23 | Discharge: 2021-06-23 | Disposition: A | Discharge status: Home / Self Care | Payer: Medicare PPO | Source: Ambulatory Visit | Attending: Internal Medicine | Admitting: Internal Medicine

## 2021-06-23 ENCOUNTER — Ambulatory Visit (INDEPENDENT_AMBULATORY_CARE_PROVIDER_SITE_OTHER): Payer: Medicare PPO

## 2021-06-23 VITALS — BP 153/80 | HR 73 | Temp 99.7°F | Resp 17

## 2021-06-23 DIAGNOSIS — R197 Diarrhea, unspecified: Secondary | ICD-10-CM | POA: Insufficient documentation

## 2021-06-23 DIAGNOSIS — R0602 Shortness of breath: Secondary | ICD-10-CM

## 2021-06-23 DIAGNOSIS — Z20822 Contact with and (suspected) exposure to covid-19: Secondary | ICD-10-CM | POA: Diagnosis not present

## 2021-06-23 DIAGNOSIS — R0981 Nasal congestion: Secondary | ICD-10-CM | POA: Diagnosis not present

## 2021-06-23 DIAGNOSIS — R059 Cough, unspecified: Secondary | ICD-10-CM | POA: Diagnosis not present

## 2021-06-23 DIAGNOSIS — C50411 Malignant neoplasm of upper-outer quadrant of right female breast: Secondary | ICD-10-CM

## 2021-06-23 DIAGNOSIS — J209 Acute bronchitis, unspecified: Secondary | ICD-10-CM | POA: Diagnosis not present

## 2021-06-23 MED ORDER — BENZONATATE 100 MG PO CAPS: 100.0000 mg | ORAL_CAPSULE | Freq: Three times a day (TID) | ORAL | 0 refills | Status: DC | PRN

## 2021-06-23 MED ORDER — ALBUTEROL SULFATE HFA 108 (90 BASE) MCG/ACT IN AERS: 1.0000 | INHALATION_SPRAY | Freq: Four times a day (QID) | RESPIRATORY_TRACT | 0 refills | Status: DC | PRN

## 2021-06-23 NOTE — ED Triage Notes (Signed)
Pt presents with cough, congestion, fatigue, and bilateral ear fullness X 4 days.

## 2021-06-23 NOTE — Discharge Instructions (Signed)
Increase oral fluid intake Take medications as prescribed We will call you with recommendations if labs are abnormal

## 2021-06-23 NOTE — ED Provider Notes (Signed)
[place]    CSN: [id number] Arrival date & time: 06/23/21  1532      History   Chief Complaint Chief Complaint  Patient presents with   APPOINTMENT: URI    HPI [name] is a 70 y.o. female comes to the urgent care with a 4-day history of chesty cough, nasal congestion increasing fatigue and bilateral ear fullness.  Patient's husband was sick with similar symptoms about a week ago.  He has since recovered from his illness.  Patient started experiencing symptoms 4 days ago and has been worsening.  Is associated with shortness of breath with exertion as well as wheezing.  No fever or chills.  She has some chest soreness from coughing.  She has had some diarrhea which is being nonmucoid and nonbloody.  She has had 2 episodes today.  No abdominal bloating or distention.  Patient is vaccinated against the flu and COVID.  No nausea or vomiting.  Patient is maintaining adequate hydration.   HPI  No past medical history on file.  There are no problems to display for this patient.     OB History   No obstetric history on file.      Home Medications    Prior to Admission medications   Medication Sig Start Date End Date Taking? Authorizing Provider  albuterol (VENTOLIN HFA) 108 (90 Base) MCG/ACT inhaler Inhale 1-2 puffs into the lungs every 6 (six) hours as needed for wheezing or shortness of breath. 06/23/21  Yes Aneudy Champlain, Myrene Galas, MD  benzonatate (TESSALON) 100 MG capsule Take 1 capsule (100 mg total) by mouth 3 (three) times daily as needed for cough. 06/23/21  Yes Ovide Dusek, Myrene Galas, MD    Family History No family history on file.  Social History     Allergies   Patient has no known allergies.   Review of Systems Review of Systems As per HPI  Physical Exam Triage Vital Signs ED Triage Vitals  Enc Vitals Group     BP 06/23/21 1618 (!) 153/80     Pulse Rate 06/23/21 1618 73     Resp 06/23/21 1618 17     Temp 06/23/21 1618 99.7 F (37.6 C)      Temp Source 06/23/21 1618 Oral     SpO2 06/23/21 1618 93 %     Weight --      Height --      Head Circumference --      Peak Flow --      Pain Score 06/23/21 1616 5     Pain Loc --      Pain Edu? --      Excl. in [place]? --    No data found.  Updated Vital Signs BP (!) 153/80 (BP Location: Left Arm)    Pulse 73    Temp 99.7 F (37.6 C) (Oral)    Resp 17    SpO2 93%   Visual Acuity Right Eye Distance:   Left Eye Distance:   Bilateral Distance:    Right Eye Near:   Left Eye Near:    Bilateral Near:     Physical Exam Vitals and nursing note reviewed.  Constitutional:      General: She is not in acute distress.    Appearance: She is not ill-appearing.  HENT:     Right Ear: Tympanic membrane normal.     Left Ear: Tympanic membrane normal.     Mouth/Throat:     Pharynx: No posterior oropharyngeal erythema.  Cardiovascular:  Rate and Rhythm: Normal rate and regular rhythm.     Pulses: Normal pulses.     Heart sounds: Normal heart sounds.  Pulmonary:     Comments: Adequate air entry in the lung bilaterally.  Expiratory wheezing noted bilaterally.  Rhonchi noted bilaterally. Abdominal:     General: Bowel sounds are normal.     Palpations: Abdomen is soft.  Musculoskeletal:        General: No swelling or tenderness. Normal range of motion.  Neurological:     Mental Status: She is alert.     UC Treatments / Results  Labs (all labs ordered are listed, but only abnormal results are displayed) Labs Reviewed  SARS CORONAVIRUS 2 (TAT 6-24 HRS)    EKG   Radiology DG Chest 2 View  Result Date: 06/23/2021 CLINICAL DATA:  Cough and shortness of breath. EXAM: CHEST - 2 VIEW COMPARISON:  04/29/2020 FINDINGS: And CABG procedure. T previous median sternotomy he heart size and mediastinal contours are within normal limits. Both lungs are clear. The visualized skeletal structures are unremarkable. IMPRESSION: No active cardiopulmonary disease. Electronically Signed   By:  Kerby Moors M.D.   On: 06/23/2021 17:24    Procedures Procedures (including critical care time)  Medications Ordered in UC Medications - No data to display  Initial Impression / Assessment and Plan / UC Course  I have reviewed the triage vital signs and the nursing notes.  Pertinent labs & imaging results that were available during my care of the patient were reviewed by me and considered in my medical decision making (see chart for details).     1.  Acute bronchitis with bronchospasm: Increase oral fluid intake Albuterol inhaler as needed for wheezing and chest tightness Tessalon Perles as needed for cough Tylenol as needed for fever and/or body aches Return to urgent care if symptoms worsen We will call you with recommendations if labs are abnormal COVID-19 PCR test has been sent. Final Clinical Impressions(s) / UC Diagnoses   Final diagnoses:  Acute bronchitis with bronchospasm     Discharge Instructions      Increase oral fluid intake Take medications as prescribed We will call you with recommendations if labs are abnormal     ED Prescriptions     Medication Sig Dispense Auth. Provider   albuterol (VENTOLIN HFA) 108 (90 Base) MCG/ACT inhaler Inhale 1-2 puffs into the lungs every 6 (six) hours as needed for wheezing or shortness of breath. 18 g Serai Tukes, Myrene Galas, MD   benzonatate (TESSALON) 100 MG capsule Take 1 capsule (100 mg total) by mouth 3 (three) times daily as needed for cough. 21 capsule Michaeljames Milnes, Myrene Galas, MD      PDMP not reviewed this encounter.   Chase Picket, MD 06/23/21 [phone]

## 2021-06-24 ENCOUNTER — Ambulatory Visit: Payer: Medicare PPO | Admitting: Adult Health

## 2021-06-24 ENCOUNTER — Other Ambulatory Visit: Payer: Medicare PPO

## 2021-06-24 LAB — SARS CORONAVIRUS 2 (TAT 6-24 HRS): SARS Coronavirus 2: NEGATIVE

## 2021-07-08 ENCOUNTER — Other Ambulatory Visit: Payer: Self-pay | Admitting: Internal Medicine

## 2021-07-08 DIAGNOSIS — E782 Mixed hyperlipidemia: Secondary | ICD-10-CM

## 2021-07-08 DIAGNOSIS — I10 Essential (primary) hypertension: Secondary | ICD-10-CM

## 2021-07-08 NOTE — Telephone Encounter (Signed)
Requested Prescriptions  Pending Prescriptions Disp Refills   atorvastatin (LIPITOR) 80 MG tablet [Pharmacy Med Name: ATORVASTATIN CALCIUM 80 MG Tablet] 90 tablet 0    Sig: TAKE 1 TABLET EVERY DAY (DOSE INCREASE)     Cardiovascular:  Antilipid - Statins Failed - 07/08/2021  7:44 AM      Failed - Total Cholesterol in normal range and within 360 days    Cholesterol, Total  Date Value Ref Range Status  12/31/2020 223 (H) 100 - 199 mg/dL Final         Failed - LDL in normal range and within 360 days    LDL Chol Calc (NIH)  Date Value Ref Range Status  12/31/2020 113 (H) 0 - 99 mg/dL Final   Direct LDL  Date Value Ref Range Status  07/18/2011 101.7 mg/dL Final    Comment:    Optimal:  <100 mg/dLNear or Above Optimal:  100-129 mg/dLBorderline High:  130-159 mg/dLHigh:  160-189 mg/dLVery High:  >190 mg/dL         Failed - Triglycerides in normal range and within 360 days    Triglycerides  Date Value Ref Range Status  12/31/2020 170 (H) 0 - 149 mg/dL Final         Passed - HDL in normal range and within 360 days    HDL  Date Value Ref Range Status  12/31/2020 81 >39 mg/dL Final         Passed - Patient is not pregnant      Passed - Valid encounter within last 12 months    Recent Outpatient Visits          1 month ago Type 2 diabetes mellitus with obesity ([place])   [place], Neoma Laming B, MD   2 months ago Type 2 diabetes mellitus with obesity [facility])   [place], Annie Main L, RPH-CPP   3 months ago Type 2 diabetes mellitus with obesity [facility])   [place], Jarome Matin, RPH-CPP   3 months ago Encounter for [employer] annual wellness exam   [place] [name], MD   6 months ago Encounter for medication review   [place], RPH-CPP      Future Appointments             In 2 months [name], MD [place]            amLODipine (NORVASC) 10 MG tablet [Pharmacy Med Name: AMLODIPINE BESYLATE 10 MG Tablet] 90 tablet 0    Sig: TAKE 1 TABLET EVERY DAY     Cardiovascular:  Calcium Channel Blockers Failed - 07/08/2021  7:44 AM      Failed - Last BP in normal range    BP Readings from Last 1 Encounters:  06/23/21 (!) 153/80         Passed - Valid encounter within last 6 months    Recent Outpatient Visits          1 month ago Type 2 diabetes mellitus with obesity ([place])   [place], Neoma Laming B, MD   2 months ago Type 2 diabetes mellitus with obesity [facility])   [place], Annie Main L, RPH-CPP   3 months ago Type 2 diabetes mellitus with obesity ([place])  [place], RPH-CPP   3 months ago Encounter for [employer] annual wellness exam   [place] [name], MD   6 months ago Encounter for medication review   [place], RPH-CPP      Future Appointments            In 2 months Wynetta Emery, Dalbert Batman, MD [place]

## 2021-07-19 ENCOUNTER — Other Ambulatory Visit: Payer: Self-pay | Admitting: Internal Medicine

## 2021-07-19 DIAGNOSIS — F32A Depression, unspecified: Secondary | ICD-10-CM

## 2021-07-19 MED ORDER — BUPROPION HCL ER (XL) 150 MG PO TB24: 150.0000 mg | ORAL_TABLET | Freq: Two times a day (BID) | ORAL | 0 refills | Status: DC

## 2021-07-19 NOTE — Telephone Encounter (Signed)
Medication: [facility] is calling to request if a short term dose of buPROPion (WELLBUTRIN XL) 150 MG 24 hr tablet [[id number]]  for 2 weeks can be sent to the pharmacy below  Has the patient contacted their pharmacy? YES (Agent: If no, request that the patient contact the pharmacy for the refill. If patient does not wish to contact the pharmacy document the reason why and proceed with request.) (Agent: If yes, when and what did the pharmacy advise?)   Preferred Pharmacy (with phone number or street name): [place] ([place]), [place] - [address] [place] ([place]) [place] [address] Phone: [phone] Fax: [phone] Hours: Not open 24 hours   Agent: Please be advised that RX refills may take up to 3 business days. We ask that you follow-up with your pharmacy.

## 2021-07-19 NOTE — Telephone Encounter (Signed)
Sending short supply to local pharmacy.  Requested Prescriptions  Pending Prescriptions Disp Refills   buPROPion (WELLBUTRIN XL) 150 MG 24 hr tablet 28 tablet 0    Sig: Take 1 tablet (150 mg total) by mouth 2 (two) times daily.     Psychiatry: Antidepressants - bupropion Failed - 07/19/2021  4:59 PM      Failed - Last BP in normal range    BP Readings from Last 1 Encounters:  06/23/21 (!) 153/80         Passed - Valid encounter within last 6 months    Recent Outpatient Visits          2 months ago Type 2 diabetes mellitus with obesity ([place])   [place] Karle Plumber B, MD   2 months ago Type 2 diabetes mellitus with obesity [facility])   [place], Annie Main L, RPH-CPP   3 months ago Type 2 diabetes mellitus with obesity [facility])   [place], Jarome Matin, RPH-CPP   3 months ago Encounter for [employer] annual wellness exam   [place] [name], MD   6 months ago Encounter for medication review   [place], RPH-CPP      Future Appointments            In 1 month Wynetta Emery, Dalbert Batman, MD [place]

## 2021-07-26 ENCOUNTER — Encounter: Payer: Self-pay | Admitting: Neurology

## 2021-07-26 ENCOUNTER — Ambulatory Visit: Payer: Medicare HMO | Admitting: Neurology

## 2021-07-26 ENCOUNTER — Telehealth: Payer: Self-pay | Admitting: Neurology

## 2021-07-26 VITALS — BP 135/73 | HR 62 | Ht 68.0 in | Wt 246.6 lb

## 2021-07-26 DIAGNOSIS — R269 Unspecified abnormalities of gait and mobility: Secondary | ICD-10-CM | POA: Insufficient documentation

## 2021-07-26 DIAGNOSIS — M5441 Lumbago with sciatica, right side: Secondary | ICD-10-CM | POA: Diagnosis not present

## 2021-07-26 DIAGNOSIS — M5442 Lumbago with sciatica, left side: Secondary | ICD-10-CM

## 2021-07-26 DIAGNOSIS — E1142 Type 2 diabetes mellitus with diabetic polyneuropathy: Secondary | ICD-10-CM | POA: Diagnosis not present

## 2021-07-26 MED ORDER — DULOXETINE HCL 30 MG PO CPEP: 30.0000 mg | ORAL_CAPSULE | Freq: Every day | ORAL | 11 refills | Status: DC

## 2021-07-26 MED ORDER — GABAPENTIN 100 MG PO CAPS: 100.0000 mg | ORAL_CAPSULE | Freq: Three times a day (TID) | ORAL | 11 refills | Status: DC

## 2021-07-26 NOTE — Telephone Encounter (Signed)
[name] [name]: [id number] (exp. 07/26/21 to 08/25/21) order sent to GI, they will reach out to the patient to schedule.

## 2021-07-26 NOTE — Telephone Encounter (Signed)
Weight confirmed on work-up sheet and corrected in chart.

## 2021-07-26 NOTE — Telephone Encounter (Signed)
Patient says that the weight put in her chart today was incorrect. She said the scale said 246.6 pounds and asked it be fixed please

## 2021-07-26 NOTE — Progress Notes (Addendum)
Chief Complaint  Patient presents with   New Patient (Initial Visit)    Room 17 - alone. Referred for abnormal head movements. Also, concerned about frequent movement in her feet (especially bothersome at night when she is laying down). Hx of CVA.      ASSESSMENT AND PLAN  [name] is a 71 y.o. female  Gait abnormality Restless leg symptoms Chronic pain  Patient has multiple lumbar decompression surgery in the past, no continue chronic low back pain, radiating pain from neck to bilateral shoulder, less dependent sensory changes, brisk lower extremity reflex, bilateral Babinski signs,  Her gait abnormality are combination of obesity, diabetic peripheral neuropathy, poorly controlled diabetes A1c was 10, low back pain, there is also suggestion of hyperreflexia, need to rule out cervical spondylitic myelopathy, MRI of cervical spine  Restless leg symptoms, related to recent flareup of depression, anxiety, chronic pain, will try gabapentin 100 mg 3 times daily, also add on Cymbalta 30 mg daily  Encouraged her moderate exercise   DIAGNOSTIC DATA (LABS, IMAGING, TESTING) - I reviewed patient records, labs, notes, testing and imaging myself where available.   MEDICAL HISTORY:  [name], is a 71 year old female seen in request by her primary care doctor [name] for evaluation of low back pain, lower extremity paresthesia,   I reviewed and summarized the referring note. PMHX. HLD HTN DM since 1999 Lumbar decompression surgery, chronic low back pain,  Right breast cancer, s/p lobectomy, s/p radiation therapy CAD s/p CABG MV repair surgery Stroke Depression   She had long history of diabetes, since 2020, she began to notice sole of bilateral feet sensitive, getting worse with weightbearing, could not tolerate walking on the floor bare feet, in addition, she has chronic low back pain, lumbar decompression surgery in the past, bilateral knee pain,  bilateral calf pain with prolonged standing, she could barely stand to finish washing dishes or cooking   She has intermittent bilateral finger paresthesia, taking gabapentin 100 mg 3 times a day with some help, has urinary urgency   She has intermittent headache, oftentimes radiating to her left shoulder, about twice a week, sharp transient pain last for minutes or longer   PHYSICAL EXAM:   Vitals:   07/26/21 1043  BP: 135/73  Pulse: 62  Weight: 256 lb 3.2 oz (116.2 kg)  Height: 5\' 8"  (1.727 m)   Not recorded     Body mass index is 38.96 kg/m.  PHYSICAL EXAMNIATION:  Gen: NAD, conversant, well nourised, well groomed                     Cardiovascular: Regular rate rhythm, no peripheral edema, warm, nontender. Eyes: Conjunctivae clear without exudates or hemorrhage Neck: Supple, no carotid bruits. Pulmonary: Clear to auscultation bilaterally   NEUROLOGICAL EXAM:  MENTAL STATUS: Depressed looking elderly female, obesity Speech:    Speech is normal; fluent and spontaneous with normal comprehension.  Cognition:     Orientation to time, place and person     Normal recent and remote memory     Normal Attention span and concentration     Normal Language, naming, repeating,spontaneous speech     Fund of knowledge   CRANIAL NERVES: CN II: Visual fields are full to confrontation. Pupils are round equal and briskly reactive to light. CN III, IV, VI: extraocular movement are normal. No ptosis. CN V: Facial sensation is intact to light touch CN VII: Face is symmetric with normal eye closure  CN VIII: Hearing is normal to causal conversation. CN IX, X: Phonation is normal. CN XI: Head turning and shoulder shrug are intact  MOTOR: There was no significant bilateral upper and lower extremity proximal and distal muscle weakness  REFLEXES: Reflexes are 1 and symmetric at the biceps, triceps, 2/2 knees, and absent at ankles. Plantar responses are extensor bilaterally  SENSORY:  Length dependent decreased light touch, pinprick, vibratory sensation to distal shin level,  COORDINATION: There is no trunk or limb dysmetria noted.  GAIT/STANCE: She needs push-up to get up from seated position, antalgic, limited by her big body habitus, unsteady  REVIEW OF SYSTEMS:  Full 14 system review of systems performed and notable only for as above All other review of systems were negative.   ALLERGIES: No Known Allergies  HOME MEDICATIONS: Current Outpatient Medications  Medication Sig Dispense Refill   Accu-Chek Softclix Lancets lancets Use as instructed 100 each 12   albuterol (VENTOLIN HFA) 108 (90 Base) MCG/ACT inhaler Inhale 2 puffs into the lungs every 6 (six) hours as needed for wheezing or shortness of breath. 1 each 2   albuterol (VENTOLIN HFA) 108 (90 Base) MCG/ACT inhaler Inhale 1-2 puffs into the lungs every 6 (six) hours as needed for wheezing or shortness of breath. 18 g 0   amLODipine (NORVASC) 10 MG tablet TAKE 1 TABLET EVERY DAY 90 tablet 0   Ascorbic Acid (VITAMIN C PO) Take 500 mg by mouth daily.     aspirin EC 81 MG tablet Take 1 tablet (81 mg total) by mouth daily. 30 tablet 5   atorvastatin (LIPITOR) 80 MG tablet TAKE 1 TABLET EVERY DAY (DOSE INCREASE) 90 tablet 0   Blood Glucose Monitoring Suppl (ACCU-CHEK AVIVA PLUS) w/Device KIT USE AS DIRECTED 1 kit 0   buPROPion (WELLBUTRIN XL) 150 MG 24 hr tablet Take 1 tablet (150 mg total) by mouth 2 (two) times daily. 28 tablet 0   carvedilol (COREG) 25 MG tablet Take 1 tablet (25 mg total) by mouth 2 (two) times daily. 180 tablet 3   cholecalciferol (VITAMIN D3) 25 MCG (1000 UNIT) tablet Take 1,000 Units by mouth daily.     diclofenac Sodium (VOLTAREN) 1 % GEL APPLY 2 GRAMS TOPICALLY 4 (FOUR) TIMES DAILY AS NEEDED. 100 g 0   ferrous sulfate (FEROSUL) 325 (65 FE) MG tablet TAKE 1 TABLET (325 MG TOTAL) BY MOUTH DAILY WITH BREAKFAST. (Patient taking differently: Take 325 mg by mouth daily. TAKE 1 TABLET (325 MG  TOTAL) BY MOUTH DAILY WITH BREAKFAST.) 90 tablet 0   gabapentin (NEURONTIN) 100 MG capsule Take one at night for a few days then increase to one twice a day. 60 capsule 0   glucose blood (ACCU-CHEK AVIVA PLUS) test strip Use as instructed 100 each 12   hydrochlorothiazide (HYDRODIURIL) 25 MG tablet TAKE 1 TABLET EVERY DAY (Patient taking differently: Take 25 mg by mouth daily.) 90 tablet 1   insulin aspart (NOVOLOG FLEXPEN) 100 UNIT/ML FlexPen Inj 4 units subcut with the 2 larges meals of the day 15 mL 11   insulin glargine (LANTUS) 100 UNIT/ML Solostar Pen Inject 10 Units into the skin at bedtime. 15 mL 11   Insulin Pen Needle (PEN NEEDLES) 31G X 8 MM MISC UAD 100 each 6   letrozole (FEMARA) 2.5 MG tablet TAKE 1 TABLET EVERY DAY (APPOINTMENT IS NEEDED FOR REFILLS) 30 tablet 0   losartan (COZAAR) 100 MG tablet Take 1 tablet (100 mg total) by mouth daily. 90 tablet 3  metFORMIN (GLUCOPHAGE) 500 MG tablet Take 1 tablet (500 mg total) by mouth 2 (two) times daily with a meal. 60 tablet 2   methocarbamol (ROBAXIN) 500 MG tablet TAKE 1 TABLET (500 MG TOTAL) BY MOUTH EVERY 8 (EIGHT) HOURS AS NEEDED FOR MUSCLE SPASMS. 60 tablet 0   pantoprazole (PROTONIX) 40 MG tablet TAKE 1 TABLET EVERY DAY (Patient taking differently: Take 40 mg by mouth daily.) 90 tablet 3   traMADol (ULTRAM) 50 MG tablet Take 1 tablet (50 mg total) by mouth daily as needed. (Patient taking differently: Take 50 mg by mouth daily as needed for moderate pain.) 30 tablet 1   traZODone (DESYREL) 50 MG tablet TAKE 1 TABLET AT BEDTIME AS NEEDED FOR SLEEP (Patient taking differently: Take 50 mg by mouth at bedtime.) 90 tablet 0   No current facility-administered medications for this visit.    PAST MEDICAL HISTORY: Past Medical History:  Diagnosis Date   Abscess in epidural space of L2-L5 lumbar spine 03/2006   Anemia, iron deficiency    Arthritis    Breast cancer (HCC) 06/12/2017   right breast   CAD (coronary artery disease), native  coronary artery 03/30/2015   Cataract    Colon polyp    a. Multiple colonic polyps status post colonoscopy in October 2007, consistent with tubular adenoma, tubulovillous adenoma with no high-grade dysplasia or malignancy identified.    Constipation    Coronary artery disease    a. s/p CABG 06/2010: S-D2; S-PDA (at time of MV surgery).   Depression    Diabetes mellitus    type II   Family history of breast cancer    Family history of prostate cancer    Genetic testing 09/25/2017   Multi-Cancer panel (83 genes) @ [facility] - No pathogenic mutations detected   GERD (gastroesophageal reflux disease)    Hx of transient ischemic attack (TIA)    a. See stroke section.   Hyperlipidemia    Hypertension    Hypoxia    a. Has history of acute hypoxic respiratory failure in the setting of bronchitis/PNA or prior admissions.   MRSA infection    a. History of recurrent skin infection and soft tissue abscesses, with MRSA positive in the past.   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 mitral valve - s/p resection and MV repair 06/2010 Dr. Cornelius Moras.   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 Distal R MCA.   Stroke [facility])    a. 11/2009: mitral mass diagnosed at this time, also has distal R MCA stenosis, tx with coumadin. Greene. Readmitted 05/2010 with TIA symptoms - had not been taking Coumadin. s/p MV surgery 06/2010. Coumadin stopped 2013 after review of chart by Dr. Jens Som since mass was removed (stroke felt possibly related to this).     PAST SURGICAL HISTORY: Past Surgical History:  Procedure Laterality Date   BREAST BIOPSY Right 08/26/2019   benign   BREAST LUMPECTOMY Right    BREAST LUMPECTOMY WITH RADIOACTIVE SEED AND SENTINEL LYMPH NODE BIOPSY Right 07/20/2017   Procedure: RIGHT BREAST LUMPECTOMY WITH RADIOACTIVE SEED AND SENTINEL LYMPH NODE BIOPSY;  Surgeon: Ovidio Kin,  MD;  Location: [facility] OR;  Service: General;  Laterality: Right;   CARDIAC CATHETERIZATION  04/03/2015   Procedure: Left Heart Cath and Cors/Grafts Angiography;  Surgeon: Peter M Swaz[place], MD;  Location: [facility] INVASIVE  CV LAB;  Service: Cardiovascular;;   CORONARY ARTERY BYPASS GRAFT  1/12   history of ankle fractures requiring surgery     LUMBAR LAMINECTOMY/DECOMPRESSION MICRODISCECTOMY Left 04/15/2015   Procedure: LUMBAR LAMINECTOMY/DECOMPRESSION MICRODISCECTOMY 1 LEVEL;  Surgeon: Tressie Stalker, MD;  Location: [facility] NEURO ORS;  Service: Neurosurgery;  Laterality: Left;  Left L23 microdiskectomy   LUMBAR LAMINECTOMY/DECOMPRESSION MICRODISCECTOMY N/A 05/27/2020   Procedure: L2-3, L3-4  decompression;  Surgeon: Eldred Manges, MD;  Location: [facility] OR;  Service: Orthopedics;  Laterality: N/A;   MV repair and resection of mass  1/12   RESECTION DISTAL CLAVICAL Left 05/16/2019   Procedure: RESECTION DISTAL CLAVICAL;  Surgeon: Kathryne Hitch, MD;  Location: [facility];  Service: Orthopedics;  Laterality: Left;   SHOULDER ARTHROSCOPY WITH ROTATOR CUFF REPAIR AND SUBACROMIAL DECOMPRESSION Left 05/16/2019   Procedure: SHOULDER ARTHROSCOPY WITH ROTATOR CUFF REPAIR AND SUBACROMIAL DECOMPRESSION;  Surgeon: Kathryne Hitch, MD;  Location: [facility];  Service: Orthopedics;  Laterality: Left;   TONSILLECTOMY  1971   TOTAL ABDOMINAL HYSTERECTOMY  1978   VESICOVAGINAL FISTULA CLOSURE W/ TAH      FAMILY HISTORY: Family History  Problem Relation Age of Onset   Colon cancer Mother        dx 23s; deceased 40   Irritable bowel syndrome Mother    Prostate cancer Father        deceased 59s   Cirrhosis Sister        died of GI bleed associated with cirrhosis of the liver   Stroke Brother    Prostate cancer Brother 29       deceased 38   Diabetes Brother        x3   Kidney disease Brother    Cancer Brother 37       unk. type   Breast cancer Cousin        several maternal  1st and 2nd cousins with breast cancer    SOCIAL HISTORY: Social History   Socioeconomic History   Marital status: Married    Spouse name: Not on file   Number of children: 3   Years of education: some college   Highest education level: Not on file  Occupational History    Employer: UNEMPLOYED  Tobacco Use   Smoking status: Former    Types: Cigarettes    Quit date: 10/14/2010    Years since quitting: 10.7   Smokeless tobacco: Never  Vaping Use   Vaping Use: Never used  Substance and Sexual Activity   Alcohol use: Yes    Comment: social   Drug use: No   Sexual activity: Not Currently  Other Topics Concern   Not on file  Social History Narrative   Lives with husband, stay at home, uses cane occasionally, still active/ambulatory.    Left-handed.   No daily caffeine use.   Social Determinants of Corporate investment banker Strain: Not on file  Food Insecurity: No Food Insecurity   Worried About Programme researcher, broadcasting/film/video in the Last Year: Never true   Ran Out of Food in the Last Year: Never true  Transportation Needs: Not on file  Physical Activity: Not on file  Stress: Not on file  Social Connections: Not on file  Intimate Partner Violence: Not on file      Levert Feinstein, M.D. Ph.D.  [facility] [address], [address] [place], [place] [address] Ph: [phone] Fax: [phone]  CC:  Marcine Matar, MD  [address] [place],  [place] [address]  System, Provider Not In

## 2021-08-11 ENCOUNTER — Other Ambulatory Visit: Payer: Self-pay

## 2021-08-11 ENCOUNTER — Ambulatory Visit
Admission: RE | Admit: 2021-08-11 | Discharge: 2021-08-11 | Disposition: A | Discharge status: Home / Self Care | Payer: Medicare HMO | Source: Ambulatory Visit | Attending: Neurology | Admitting: Neurology

## 2021-08-11 DIAGNOSIS — M5442 Lumbago with sciatica, left side: Secondary | ICD-10-CM

## 2021-08-11 DIAGNOSIS — M5441 Lumbago with sciatica, right side: Secondary | ICD-10-CM

## 2021-08-11 DIAGNOSIS — R269 Unspecified abnormalities of gait and mobility: Secondary | ICD-10-CM

## 2021-08-11 DIAGNOSIS — E1142 Type 2 diabetes mellitus with diabetic polyneuropathy: Secondary | ICD-10-CM

## 2021-08-17 ENCOUNTER — Other Ambulatory Visit: Payer: Self-pay | Admitting: Internal Medicine

## 2021-08-17 DIAGNOSIS — Z1231 Encounter for screening mammogram for malignant neoplasm of breast: Secondary | ICD-10-CM

## 2021-08-23 DIAGNOSIS — H524 Presbyopia: Secondary | ICD-10-CM | POA: Diagnosis not present

## 2021-08-23 DIAGNOSIS — H52203 Unspecified astigmatism, bilateral: Secondary | ICD-10-CM | POA: Diagnosis not present

## 2021-08-23 DIAGNOSIS — E119 Type 2 diabetes mellitus without complications: Secondary | ICD-10-CM | POA: Diagnosis not present

## 2021-08-23 DIAGNOSIS — Z0100 Encounter for examination of eyes and vision without abnormal findings: Secondary | ICD-10-CM | POA: Diagnosis not present

## 2021-08-23 DIAGNOSIS — J449 Chronic obstructive pulmonary disease, unspecified: Secondary | ICD-10-CM | POA: Diagnosis not present

## 2021-08-23 DIAGNOSIS — Z961 Presence of intraocular lens: Secondary | ICD-10-CM | POA: Diagnosis not present

## 2021-08-23 LAB — HM DIABETES EYE EXAM

## 2021-08-25 ENCOUNTER — Encounter: Payer: Self-pay | Admitting: Orthopaedic Surgery

## 2021-08-25 ENCOUNTER — Ambulatory Visit (INDEPENDENT_AMBULATORY_CARE_PROVIDER_SITE_OTHER): Payer: Medicare HMO | Admitting: Orthopaedic Surgery

## 2021-08-25 DIAGNOSIS — M65341 Trigger finger, right ring finger: Secondary | ICD-10-CM | POA: Diagnosis not present

## 2021-08-25 NOTE — Progress Notes (Signed)
The patient is well-known to me.  She has been dealing with chronic triggering of her right ring finger.  She had this on the left side and eventually had a successful A1 pulley release.  She has tried failed conservative treatment on the right side.  Steroid injections do not help.  She is interested in an A1 pulley release/trigger finger release on the right ring finger at this standpoint given therapies or treatment.  She is a diabetic and on insulin. ? ?Examination her right hand shows pain over the ring finger A1 pulley and there is active triggering as well.  She is neurovascular intact. ? ?I agree with setting this up as an outpatient.  Having had this before she is fully aware of the risk and benefits of this type of surgery and what the surgery involves including the interoperative and postoperative course.  We will work on getting this scheduled and be in touch.  We would then see her back in 2 weeks postoperative for suture removal.  All questions and concerns were answered and addressed. ?

## 2021-08-27 ENCOUNTER — Other Ambulatory Visit: Payer: Self-pay | Admitting: Hematology and Oncology

## 2021-08-27 ENCOUNTER — Other Ambulatory Visit: Payer: Self-pay | Admitting: Internal Medicine

## 2021-08-27 MED ORDER — LETROZOLE 2.5 MG PO TABS: ORAL_TABLET | ORAL | 0 refills | Status: DC

## 2021-08-27 NOTE — Telephone Encounter (Signed)
Requested Prescriptions  ?Pending Prescriptions Disp Refills  ?? traZODone (DESYREL) 50 MG tablet [Pharmacy Med Name: TRAZODONE HYDROCHLORIDE 50 MG Tablet] 90 tablet 0  ?  Sig: TAKE 1 TABLET AT BEDTIME AS NEEDED FOR SLEEP  ?  ? Psychiatry: Antidepressants - Serotonin Modulator Passed - 08/27/2021 12:53 PM  ?  ?  Passed - Valid encounter within last 6 months  ?  Recent Outpatient Visits   ?      ? 3 months ago Type 2 diabetes mellitus with obesity ([place])  ? [place] Karle Plumber B, MD  ? 4 months ago Type 2 diabetes mellitus with obesity ([place])  ? [place], RPH-CPP  ? 4 months ago Type 2 diabetes mellitus with obesity ([place])  ? [place], RPH-CPP  ? 5 months ago Encounter for [employer] annual wellness exam  ? [place] [name], MD  ? 7 months ago Encounter for medication review  ? [place], RPH-CPP  ?  ?  ?Future Appointments   ?        ? In 1 week [name], MD [place]  ?  ? ?  ?  ?  ? ?

## 2021-08-27 NOTE — Telephone Encounter (Signed)
Pt called to request refill on Letrozole. Advised pt we will refill for 30 days but that she will need to come in to see Mendel Ryder, NP by 09/24/21 for [place] to refill it again. Pt verbalized thanks and understanding.  ?

## 2021-08-30 ENCOUNTER — Telehealth: Payer: Self-pay | Admitting: Adult Health

## 2021-08-30 NOTE — Telephone Encounter (Signed)
Scheduled per 3/3 in basket, pt has been called and confirmed  ?

## 2021-09-02 ENCOUNTER — Other Ambulatory Visit: Payer: Self-pay | Admitting: Physician Assistant

## 2021-09-06 ENCOUNTER — Other Ambulatory Visit: Payer: Self-pay

## 2021-09-06 ENCOUNTER — Ambulatory Visit: Payer: Medicare HMO | Attending: Internal Medicine | Admitting: Internal Medicine

## 2021-09-06 ENCOUNTER — Encounter: Payer: Self-pay | Admitting: Internal Medicine

## 2021-09-06 VITALS — BP 160/75 | HR 64 | Ht 68.0 in | Wt 243.8 lb

## 2021-09-06 DIAGNOSIS — E669 Obesity, unspecified: Secondary | ICD-10-CM | POA: Diagnosis not present

## 2021-09-06 DIAGNOSIS — I25810 Atherosclerosis of coronary artery bypass graft(s) without angina pectoris: Secondary | ICD-10-CM

## 2021-09-06 DIAGNOSIS — L602 Onychogryphosis: Secondary | ICD-10-CM

## 2021-09-06 DIAGNOSIS — Z6837 Body mass index (BMI) 37.0-37.9, adult: Secondary | ICD-10-CM | POA: Diagnosis not present

## 2021-09-06 DIAGNOSIS — E1169 Type 2 diabetes mellitus with other specified complication: Secondary | ICD-10-CM | POA: Diagnosis not present

## 2021-09-06 DIAGNOSIS — I10 Essential (primary) hypertension: Secondary | ICD-10-CM | POA: Diagnosis not present

## 2021-09-06 DIAGNOSIS — N1831 Chronic kidney disease, stage 3a: Secondary | ICD-10-CM

## 2021-09-06 DIAGNOSIS — M65342 Trigger finger, left ring finger: Secondary | ICD-10-CM | POA: Diagnosis not present

## 2021-09-06 DIAGNOSIS — E1142 Type 2 diabetes mellitus with diabetic polyneuropathy: Secondary | ICD-10-CM

## 2021-09-06 LAB — POCT GLYCOSYLATED HEMOGLOBIN (HGB A1C): HbA1c, POC (controlled diabetic range): 7.9 % — AB (ref 0.0–7.0)

## 2021-09-06 LAB — GLUCOSE, POCT (MANUAL RESULT ENTRY): POC Glucose: 147 mg/dl — AB (ref 70–99)

## 2021-09-06 MED ORDER — DEXCOM G6 SENSOR MISC
1.0000 | Freq: Every day | 11 refills | Status: DC
Start: 2021-09-06 — End: 2022-04-11

## 2021-09-06 MED ORDER — HYDRALAZINE HCL 25 MG PO TABS: 25.0000 mg | ORAL_TABLET | Freq: Two times a day (BID) | ORAL | 6 refills | Status: DC

## 2021-09-06 MED ORDER — DEXCOM G6 RECEIVER DEVI
1.0000 | Freq: Every day | 0 refills | Status: DC
Start: 2021-09-06 — End: 2022-08-09

## 2021-09-06 MED ORDER — HYDRALAZINE HCL 25 MG PO TABS: 25.0000 mg | ORAL_TABLET | Freq: Three times a day (TID) | ORAL | 6 refills | Status: DC

## 2021-09-06 NOTE — Patient Instructions (Signed)
Your blood pressure is not at goal. ?We added another blood pressure medication called Hydralazine 25 mg to take twice a day.  Follow up with our clinical pharmacist in 2 weeks for repeat blood pressure check. ?Please return to the lab this week to have cholesterol and kidney level check. ?

## 2021-09-06 NOTE — Progress Notes (Signed)
Patient ID: [name], female    DOB: Jan 05, 1951  MRN: [id number]  CC: Diabetes   Subjective: [name] is a 71 y.o. female who presents for chronic ds management Her concerns today include:  Pt with hx of HTN, DM, HL, depression, chronic back pain, RT breast CA (intraductal CA, ER/PR+, lumpectomy and XRT - completed 08/2017), CKD stage 3, CVA 2011 (residual weakness on LT side), CAD s/p CABG and MVR/resection of fibroelastoma, IDA.   DIABETES TYPE 2/obesity Last A1C:   Results for orders placed or performed in visit on 09/06/21  POCT glucose (manual entry)  Result Value Ref Range   POC Glucose 147 (A) 70 - 99 mg/dl  POCT glycosylated hemoglobin (Hb A1C)  Result Value Ref Range   Hemoglobin A1C     HbA1c POC (<> result, manual entry)     HbA1c, POC (prediabetic range)     HbA1c, POC (controlled diabetic range) 7.9 (A) 0.0 - 7.0 %    Med Adherence:  [x]  Yes  -on metformin 500 mg twice a day, Lantus insulin 10 units at bedtime and NovoLog 4 units with the 2 largest meals of the day.  She admits that she sometimes takes the NovoLog only once a day if one of her meals are smaller. Medication side effects:  []  Yes    [x]  No Home Monitoring?  []  Yes    [x]  No -misplaced her glucometer and a recent move.  She is wanting the Dexcom device Home glucose results range: Diet Adherence: Feels she is doing better with her eating habits. Exercise: Walking a little bit more. Hypoglycemic episodes?: []  Yes    []  No Numbness of the feet? [x]  Yes but feet are very sensitive.  Sometimes it feels like she is walking on little pebbles.   Retinopathy hx? []  Yes    [x]  No Last eye exam:  Comments: Due for recheck of creatinine and GFR.  She is on low-dose metformin.  HTN/CAD: Reports compliance with taking medications which include amlodipine 10 mg daily, hydrochlorothiazide 25 mg daily, Cozaar 100 mg daily, carvedilol 25 mg twice a day.  She reports taking all of these medicines  already for the morning.  She limits salt in the foods.  Denies any chest pains or shortness of breath at this time.  No swelling in the legs.  HL: Reports that she has been compliant with taking the atorvastatin since last visit.   Patient Active Problem List   Diagnosis Date Noted   DM type 2 with diabetic peripheral neuropathy ([place]) 07/26/2021   Bilateral low back pain with bilateral sciatica 07/26/2021   Gait abnormality 07/26/2021   Controlled substance agreement signed 12/31/2020   Shoulder stiffness, left 07/07/2020   Status post lumbar spine surgery for decompression of spinal cord 06/09/2020   Lumbar stenosis 05/27/2020   Spinal stenosis of lumbar region 05/21/2019   Complete tear of left rotator cuff 05/16/2019   Uncontrolled type 2 diabetes mellitus with hyperglycemia ([place]) 12/19/2018   Microalbuminuria 04/08/2018   Genetic testing 09/25/2017   Family history of breast cancer    Family history of prostate cancer    Malignant neoplasm of upper-outer quadrant of right breast in female, estrogen receptor positive ([place]) 06/26/2017   Lumbar herniated disc 04/15/2015   Abnormal nuclear stress test 04/03/2015   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   CAD (coronary artery disease), native coronary artery 03/30/2015   S/P mitral valve repair 11/16/2010   Dyslipidemia 06/29/2010   Diabetes (  [place]) 09/29/2006   ANEMIA-IRON DEFICIENCY 09/29/2006   Essential hypertension 09/29/2006     Current Outpatient Medications on File Prior to Visit  Medication Sig Dispense Refill   Accu-Chek Softclix Lancets lancets Use as instructed 100 each 12   albuterol (VENTOLIN HFA) 108 (90 Base) MCG/ACT inhaler Inhale 2 puffs into the lungs every 6 (six) hours as needed for wheezing or shortness of breath. 1 each 2   albuterol (VENTOLIN HFA) 108 (90 Base) MCG/ACT inhaler Inhale 1-2 puffs into the lungs every 6 (six) hours as needed for wheezing or shortness of breath. 18 g 0   amLODipine  (NORVASC) 10 MG tablet TAKE 1 TABLET EVERY DAY 90 tablet 0   Ascorbic Acid (VITAMIN C PO) Take 500 mg by mouth daily.     aspirin EC 81 MG tablet Take 1 tablet (81 mg total) by mouth daily. 30 tablet 5   atorvastatin (LIPITOR) 80 MG tablet TAKE 1 TABLET EVERY DAY (DOSE INCREASE) 90 tablet 0   Blood Glucose Monitoring Suppl (ACCU-CHEK AVIVA PLUS) w/Device KIT USE AS DIRECTED 1 kit 0   buPROPion (WELLBUTRIN XL) 150 MG 24 hr tablet Take 1 tablet (150 mg total) by mouth 2 (two) times daily. 28 tablet 0   cholecalciferol (VITAMIN D3) 25 MCG (1000 UNIT) tablet Take 1,000 Units by mouth daily.     diclofenac Sodium (VOLTAREN) 1 % GEL APPLY 2 GRAMS TOPICALLY 4 (FOUR) TIMES DAILY AS NEEDED. 100 g 0   DULoxetine (CYMBALTA) 30 MG capsule Take 1 capsule (30 mg total) by mouth daily. 30 capsule 11   ferrous sulfate (FEROSUL) 325 (65 FE) MG tablet TAKE 1 TABLET (325 MG TOTAL) BY MOUTH DAILY WITH BREAKFAST. (Patient taking differently: Take 325 mg by mouth daily. TAKE 1 TABLET (325 MG TOTAL) BY MOUTH DAILY WITH BREAKFAST.) 90 tablet 0   gabapentin (NEURONTIN) 100 MG capsule Take 1 capsule (100 mg total) by mouth 3 (three) times daily. 90 capsule 11   glucose blood (ACCU-CHEK AVIVA PLUS) test strip Use as instructed 100 each 12   hydrochlorothiazide (HYDRODIURIL) 25 MG tablet TAKE 1 TABLET EVERY DAY (Patient taking differently: Take 25 mg by mouth daily.) 90 tablet 1   insulin aspart (NOVOLOG FLEXPEN) 100 UNIT/ML FlexPen Inj 4 units subcut with the 2 larges meals of the day 15 mL 11   insulin glargine (LANTUS) 100 UNIT/ML Solostar Pen Inject 10 Units into the skin at bedtime. 15 mL 11   Insulin Pen Needle (PEN NEEDLES) 31G X 8 MM MISC UAD 100 each 6   letrozole (FEMARA) 2.5 MG tablet TAKE 1 TABLET EVERY DAY (APPOINTMENT IS NEEDED FOR REFILLS) 30 tablet 0   losartan (COZAAR) 100 MG tablet Take 1 tablet (100 mg total) by mouth daily. 90 tablet 3   metFORMIN (GLUCOPHAGE) 500 MG tablet Take 1 tablet (500 mg total) by  mouth 2 (two) times daily with a meal. 60 tablet 2   methocarbamol (ROBAXIN) 500 MG tablet TAKE 1 TABLET (500 MG TOTAL) BY MOUTH EVERY 8 (EIGHT) HOURS AS NEEDED FOR MUSCLE SPASMS. 60 tablet 0   pantoprazole (PROTONIX) 40 MG tablet TAKE 1 TABLET EVERY DAY (Patient taking differently: Take 40 mg by mouth daily.) 90 tablet 3   traZODone (DESYREL) 50 MG tablet TAKE 1 TABLET AT BEDTIME AS NEEDED FOR SLEEP 90 tablet 0   carvedilol (COREG) 25 MG tablet Take 1 tablet (25 mg total) by mouth 2 (two) times daily. 180 tablet 3   No current facility-administered medications on file  prior to visit.    No Known Allergies  Social History   Socioeconomic History   Marital status: Married    Spouse name: Not on file   Number of children: 3   Years of education: some college   Highest education level: Not on file  Occupational History    Employer: UNEMPLOYED  Tobacco Use   Smoking status: Former    Types: Cigarettes    Quit date: 10/14/2010    Years since quitting: 10.9   Smokeless tobacco: Never  Vaping Use   Vaping Use: Never used  Substance and Sexual Activity   Alcohol use: Yes    Comment: social   Drug use: No   Sexual activity: Not Currently  Other Topics Concern   Not on file  Social History Narrative   Lives with husband, stay at home, uses cane occasionally, still active/ambulatory.    Left-handed.   No daily caffeine use.   Social Determinants of Radio broadcast assistant Strain: Not on file  Food Insecurity: No Food Insecurity   Worried About Charity fundraiser in the Last Year: Never true   Arboriculturist in the Last Year: Never true  Transportation Needs: Not on file  Physical Activity: Not on file  Stress: Not on file  Social Connections: Not on file  Intimate Partner Violence: Not on file    Family History  Problem Relation Age of Onset   Colon cancer Mother        dx 83s; deceased 25   Irritable bowel syndrome Mother    Prostate cancer Father         deceased 64s   Cirrhosis Sister        died of GI bleed associated with cirrhosis of the liver   Stroke Brother    Prostate cancer Brother 51       deceased 47   Diabetes Brother        x3   Kidney disease Brother    Cancer Brother 60       unk. type   Breast cancer Cousin        several maternal 1st and 2nd cousins with breast cancer    Past Surgical History:  Procedure Laterality Date   BREAST BIOPSY Right 08/26/2019   benign   BREAST LUMPECTOMY Right    BREAST LUMPECTOMY WITH RADIOACTIVE SEED AND SENTINEL LYMPH NODE BIOPSY Right 07/20/2017   Procedure: RIGHT BREAST LUMPECTOMY WITH RADIOACTIVE SEED AND SENTINEL LYMPH NODE BIOPSY;  Surgeon: Alphonsa Overall, MD;  Location: [facility];  Service: General;  Laterality: Right;   CARDIAC CATHETERIZATION  04/03/2015   Procedure: Left Heart Cath and Cors/Grafts Angiography;  Surgeon: Peter M Martinique, MD;  Location: [facility];  Service: Cardiovascular;;   CORONARY ARTERY BYPASS GRAFT  1/12   history of ankle fractures requiring surgery     LUMBAR LAMINECTOMY/DECOMPRESSION MICRODISCECTOMY Left 04/15/2015   Procedure: LUMBAR LAMINECTOMY/DECOMPRESSION MICRODISCECTOMY 1 LEVEL;  Surgeon: Newman Pies, MD;  Location: [facility];  Service: Neurosurgery;  Laterality: Left;  Left L23 microdiskectomy   LUMBAR LAMINECTOMY/DECOMPRESSION MICRODISCECTOMY N/A 05/27/2020   Procedure: L2-3, L3-4  decompression;  Surgeon: [name], MD;  Location: [facility];  Service: Orthopedics;  Laterality: N/A;   MV repair and resection of mass  1/12   RESECTION DISTAL CLAVICAL Left 05/16/2019   Procedure: RESECTION DISTAL CLAVICAL;  Surgeon: Mcarthur Rossetti, MD;  Location: [facility];  Service: Orthopedics;  Laterality: Left;  SHOULDER ARTHROSCOPY WITH ROTATOR CUFF REPAIR AND SUBACROMIAL DECOMPRESSION Left 05/16/2019   Procedure: SHOULDER ARTHROSCOPY WITH ROTATOR CUFF REPAIR AND SUBACROMIAL DECOMPRESSION;  Surgeon: Mcarthur Rossetti, MD;   Location: [facility];  Service: Orthopedics;  Laterality: Left;   TONSILLECTOMY  1971   TOTAL ABDOMINAL HYSTERECTOMY  1978   VESICOVAGINAL FISTULA CLOSURE W/ TAH      ROS: Review of Systems MSK: She has seen Dr. Ninfa Linden for trigger finger on the right ring finger.  She is scheduled for surgery later this month.  PHYSICAL EXAM: BP (!) 160/75    Pulse 64    Ht $R'5\' 8"'oA$  (1.727 m)    Wt 243 lb 12.8 oz (110.6 kg)    SpO2 99%    BMI 37.07 kg/m   Wt Readings from Last 3 Encounters:  09/06/21 243 lb 12.8 oz (110.6 kg)  07/26/21 246 lb 9.6 oz (111.9 kg)  05/24/21 236 lb 12.8 oz (107.4 kg)  Repeat blood pressure 200/76  Physical Exam  General appearance - alert, well appearing, obese older African-American female and in no distress Mental status - normal mood, behavior, speech, dress, motor activity, and thought processes Neck - supple, no significant adenopathy Chest - clear to auscultation, no wheezes, rales or rhonchi, symmetric air entry Heart - normal rate, regular rhythm, normal S1, S2, no murmurs, rubs, clicks or gallops Extremities - peripheral pulses normal, no pedal edema, no clubbing or cyanosis Diabetic Foot Exam - Simple   Simple Foot Form Diabetic Foot exam was performed with the following findings: Yes 09/06/2021  1:22 PM  Visual Inspection See comments: Yes Sensation Testing Intact to touch and monofilament testing bilaterally: Yes Pulse Check Posterior Tibialis and Dorsalis pulse intact bilaterally: Yes Comments Toenails are thick, overgrown and discolored.      Depression screen [facility] 2/9 09/06/2021 05/24/2021 05/11/2021  Decreased Interest 0 0 0  Down, Depressed, Hopeless 0 0 0  PHQ - 2 Score 0 0 0  Altered sleeping 1 - 2  Tired, decreased energy 3 - 1  Change in appetite 2 - 0  Feeling bad or failure about yourself  0 - 0  Trouble concentrating 0 - 0  Moving slowly or fidgety/restless 0 - 0  Suicidal thoughts 0 - 0  PHQ-9 Score 6 - 3   Difficult doing work/chores - - -  Some recent data might be hidden   GAD 7 : Generalized Anxiety Score 09/06/2021 05/11/2021 03/29/2021 12/31/2020  Nervous, Anxious, on Edge 0 0 0 0  Control/stop worrying 0 1 0 1  Worry too much - different things $RemoveBeforeDE'1 1 1 1  'DhJzuZYVPdWqLTQ$ Trouble relaxing 0 0 0 1  Restless 0 0 0 0  Easily annoyed or irritable $RemoveBefo'2 1 2 2  'FLnsvcIihci$ Afraid - awful might happen 0 0 0 0  Total GAD 7 Score $Remov'3 3 3 5     'CFoiiZ$ CMP Latest Ref Rng & Units 05/11/2021 04/15/2021 04/05/2021  Glucose 70 - 99 mg/dL 158(H) 127(H) 186(H)  BUN 8 - 27 mg/dL $Remove'19 23 21  'GBeSIOT$ Creatinine 0.57 - 1.00 mg/dL 1.36(H) 1.26(H) 2.15(H)  Sodium 134 - 144 mmol/L 140 136 137  Potassium 3.5 - 5.2 mmol/L 4.9 4.5 4.9  Chloride 96 - 106 mmol/L 102 105 99  CO2 20 - 29 mmol/L $RemoveB'25 23 25  'PMyaCrrG$ Calcium 8.7 - 10.3 mg/dL 9.4 8.6(L) 9.4  Total Protein 6.5 - 8.1 g/dL - 6.6 -  Total Bilirubin 0.3 - 1.2 mg/dL - 1.2 -  Alkaline Phos 38 - 126  U/L - 54 -  AST 15 - 41 U/L - 17 -  ALT 0 - 44 U/L - 13 -   Lipid Panel     Component Value Date/Time   CHOL 223 (H) 12/31/2020 1010   TRIG 170 (H) 12/31/2020 1010   HDL 81 12/31/2020 1010   CHOLHDL 2.8 12/31/2020 1010   CHOLHDL 2.8 04/28/2016 1028   VLDL 19 04/28/2016 1028   LDLCALC 113 (H) 12/31/2020 1010   LDLDIRECT 101.7 07/18/2011 0901    CBC    Component Value Date/Time   WBC 12.1 (H) 04/15/2021 1929   RBC 4.29 04/15/2021 1929   HGB 11.0 (L) 04/15/2021 1929   HGB 11.9 12/31/2020 1010   HCT 35.6 (L) 04/15/2021 1929   HCT 37.4 12/31/2020 1010   PLT 224 04/15/2021 1929   PLT 267 12/31/2020 1010   MCV 83.0 04/15/2021 1929   MCV 80 12/31/2020 1010   MCH 25.6 (L) 04/15/2021 1929   MCHC 30.9 04/15/2021 1929   RDW 14.9 04/15/2021 1929   RDW 14.6 12/31/2020 1010   LYMPHSABS 1.1 04/15/2021 1929   MONOABS 0.5 04/15/2021 1929   EOSABS 0.0 04/15/2021 1929   BASOSABS 0.1 04/15/2021 1929    ASSESSMENT AND PLAN:  1. Type 2 diabetes mellitus with obesity (HCC) A1c has improved compared to last visit  and is closer to goal.  However I do not know what her blood sugars are doing as she does not have a log with her.  She will continue Lantus insulin 10 units daily and NovoLog 4 units with the 2 largest meals of the day.  Continue metformin.   Prescription sent to her pharmacy for the [facility] device. Encouraged her to try to continue healthy eating habits.  She has seen the nutritionist in November of last year and states that it was helpful. - POCT glucose (manual entry) - POCT glycosylated hemoglobin (Hb A1C) - Continuous Blood Gluc Sensor (DEXCOM G6 SENSOR) MISC; 1 packet by Does not apply route daily.  Dispense: 3 each; Refill: 11 - Continuous Blood Gluc Receiver (DEXCOM G6 RECEIVER) DEVI; 1 Device by Does not apply route daily.  Dispense: 1 each; Refill: 0  2. Essential hypertension Not at goal.  We will add hydralazine.  Follow-up with clinical pharmacist in 2 weeks for repeat blood pressure check. - hydrALAZINE (APRESOLINE) 25 MG tablet; Take 1 tablet (25 mg total) by mouth 3 (three) times daily.  Dispense: 60 tablet; Refill: 6  3. Diabetic peripheral neuropathy ([place]) - Ambulatory referral to Podiatry  4. CAD of autologous artery bypass graft without angina Stable.  Continue carvedilol, atorvastatin and aspirin. - Lipid panel; Future  5. Stage 3a chronic kidney disease ([place]) - Basic Metabolic Panel; Future  6. Overgrown nail - Ambulatory referral to Podiatry  7.  Trigger finger. Scheduled for surgery with Dr. Ninfa Linden later this month.  Patient was given the opportunity to ask questions.  Patient verbalized understanding of the plan and was able to repeat key elements of the plan.   This documentation was completed using Radio producer.  Any transcriptional errors are unintentional.  Orders Placed This Encounter  Procedures   Basic Metabolic Panel   Lipid panel   Ambulatory referral to Podiatry   POCT glucose (manual entry)   POCT glycosylated  hemoglobin (Hb A1C)     Requested Prescriptions   Signed Prescriptions Disp Refills   hydrALAZINE (APRESOLINE) 25 MG tablet 60 tablet 6    Sig: Take 1 tablet (25 mg  total) by mouth 3 (three) times daily.   Continuous Blood Gluc Sensor (DEXCOM G6 SENSOR) MISC 3 each 11    Sig: 1 packet by Does not apply route daily.   Continuous Blood Gluc Receiver (DEXCOM G6 RECEIVER) DEVI 1 each 0    Sig: 1 Device by Does not apply route daily.    Return in about 1 month (around 10/07/2021) for Appt with [facility] in 2 wks for BP check.  Karle Plumber, MD, FACP

## 2021-09-08 ENCOUNTER — Encounter (HOSPITAL_BASED_OUTPATIENT_CLINIC_OR_DEPARTMENT_OTHER): Payer: Self-pay | Admitting: Orthopaedic Surgery

## 2021-09-08 ENCOUNTER — Telehealth: Payer: Self-pay | Admitting: *Deleted

## 2021-09-08 ENCOUNTER — Other Ambulatory Visit: Payer: Self-pay

## 2021-09-08 NOTE — Telephone Encounter (Signed)
Pt has been scheduled for telephone visit, 09/10/21 10:00. ? ?Consent on file / medications reconciled. ? ?  ?Patient Consent for Virtual Visit  ? ? ?   ? ?[name] has provided verbal consent on 09/08/2021 for a virtual visit (video or telephone). ? ? ?CONSENT FOR VIRTUAL VISIT FOR:  [name]  ?By participating in this virtual visit I agree to the following: ? ?I hereby voluntarily request, consent and authorize [facility] and its employed or contracted physicians, physician assistants, nurse practitioners or other licensed health care professionals (the Practitioner), to provide me with telemedicine health care services (the ?Services") as deemed necessary by the treating Practitioner. I acknowledge and consent to receive the Services by the Practitioner via telemedicine. I understand that the telemedicine visit will involve communicating with the Practitioner through live audiovisual communication technology and the disclosure of certain medical information by electronic transmission. I acknowledge that I have been given the opportunity to request an in-person assessment or other available alternative prior to the telemedicine visit and am voluntarily participating in the telemedicine visit. ? ?I understand that I have the right to withhold or withdraw my consent to the use of telemedicine in the course of my care at any time, without affecting my right to future care or treatment, and that the Practitioner or I may terminate the telemedicine visit at any time. I understand that I have the right to inspect all information obtained and/or recorded in the course of the telemedicine visit and may receive copies of available information for a reasonable fee.  I understand that some of the potential risks of receiving the Services via telemedicine include:  ?Delay or interruption in medical evaluation due to technological equipment failure or disruption; ?Information transmitted may not  be sufficient (e.g. poor resolution of images) to allow for appropriate medical decision making by the Practitioner; and/or  ?In rare instances, security protocols could fail, causing a breach of personal health information. ? ?Furthermore, I acknowledge that it is my responsibility to provide information about my medical history, conditions and care that is complete and accurate to the best of my ability. I acknowledge that Practitioner's advice, recommendations, and/or decision may be based on factors not within their control, such as incomplete or inaccurate data provided by me or distortions of diagnostic images or specimens that may result from electronic transmissions. I understand that the practice of medicine is not an exact science and that Practitioner makes no warranties or guarantees regarding treatment outcomes. I acknowledge that a copy of this consent can be made available to me via my patient portal ([facility]), or I can request a printed copy by calling the office of [facility].   ? ?I understand that my insurance will be billed for this visit.  ? ?I have read or had this consent read to me. ?I understand the contents of this consent, which adequately explains the benefits and risks of the Services being provided via telemedicine.  ?I have been provided ample opportunity to ask questions regarding this consent and the Services and have had my questions answered to my satisfaction. ?I give my informed consent for the services to be provided through the use of telemedicine in my medical care ? ?  ?

## 2021-09-08 NOTE — Progress Notes (Signed)
Spoke with Dondra Spry, at Dr. Trevor Mace office requesting cardiac clearance and aspirin instructions.  ?

## 2021-09-08 NOTE — Telephone Encounter (Signed)
Please set the patient up for virtual telephone visit with preop APP ?

## 2021-09-08 NOTE — Telephone Encounter (Signed)
Pt has been scheduled for telephone visit, 09/10/21, 10:00. ?

## 2021-09-08 NOTE — Telephone Encounter (Signed)
? ?  Pre-operative Risk Assessment  ?  ?Patient Name: [name]  ?DOB: 29-Jan-1951 ?MRN: [id number]  ? ?  ? ?Request for Surgical Clearance   ? ?Procedure:   RIGHT RING FINGER A-1 PULLEY RELEASE ? ?Date of Surgery:  Clearance TBD                              ?   ?Surgeon:  Jean Rosenthal, MD ?Surgeon's Group or Practice Name:  [facility] ?Phone number:  [phone] ?Fax number:  [phone] ?  ?Type of Clearance Requested:   ?- Medical  ?- Pharmacy:  Hold Aspirin NOT INDICATED ?  ?Type of Anesthesia:  MAC ?  ?Additional requests/questions:   ? ?Signed, ?Jeanann Lewandowsky   ?09/08/2021, 10:55 AM  ? ?

## 2021-09-10 ENCOUNTER — Ambulatory Visit (INDEPENDENT_AMBULATORY_CARE_PROVIDER_SITE_OTHER): Payer: Medicare HMO | Admitting: General Practice

## 2021-09-10 ENCOUNTER — Other Ambulatory Visit: Payer: Self-pay

## 2021-09-10 DIAGNOSIS — Z01810 Encounter for preprocedural cardiovascular examination: Secondary | ICD-10-CM

## 2021-09-10 NOTE — Progress Notes (Signed)
? ?Virtual Visit via Telephone Note  ? ?This visit type was conducted due to national recommendations for restrictions regarding the COVID-19 Pandemic (e.g. social distancing) in an effort to limit this patient's exposure and mitigate transmission in our community.  Due to her co-morbid illnesses, this patient is at least at moderate risk for complications without adequate follow up.  This format is felt to be most appropriate for this patient at this time.  The patient did not have access to video technology/had technical difficulties with video requiring transitioning to audio format only (telephone).  All issues noted in this document were discussed and addressed.  No physical exam could be performed with this format.  Please refer to the patient's chart for her  consent to telehealth for [facility]. ?Evaluation Performed:  Preoperative cardiovascular risk assessment ? ?This visit type was conducted due to national recommendations for restrictions regarding the COVID-19 Pandemic (e.g. social distancing).  This format is felt to be most appropriate for this patient at this time.  All issues noted in this document were discussed and addressed.  No physical exam was performed (except for noted visual exam findings with Video Visits).  Please refer to the patient's chart (MyChart message for video visits and phone note for telephone visits) for the patient's consent to telehealth for [facility]. ?_____________  ? ?Date:  09/10/2021  ? ?Patient ID:  [name], [name] 1951/04/18, MRN [id number] ?Patient Location:  ?Home ?Provider location:   ?Office ? ?Primary Care Provider:  [name], MD ?Primary Cardiologist:  Fransico Him, MD ? ?Chief Complaint  ?  ?70 y.o. y/o female with a h/o essential hypertension, coronary artery disease, uncontrolled type 2 diabetes, HLD, who is pending right ring finger a-1 pulley release, and presents today for telephonic preoperative cardiovascular risk  assessment. ? ?Past Medical History  ?  ?Past Medical History:  ?Diagnosis Date  ? Abscess in epidural space of L2-L5 lumbar spine 03/2006  ? Anemia, iron deficiency   ? Arthritis   ? Breast cancer ([place]) 06/12/2017  ? right breast  ? CAD (coronary artery disease), native coronary artery 03/30/2015  ? Cataract   ? Colon polyp   ? a. Multiple colonic polyps status post colonoscopy in October 2007, consistent with tubular adenoma, tubulovillous adenoma with no high-grade dysplasia or malignancy identified.   ? Constipation   ? Coronary artery disease   ? a. s/p CABG 06/2010: S-D2; S-PDA (at time of MV surgery).  ? Depression   ? Diabetes mellitus   ? type II  ? Family history of breast cancer   ? Family history of prostate cancer   ? Genetic testing 09/25/2017  ? Multi-Cancer panel (83 genes) @ [facility] - No pathogenic mutations detected  ? GERD (gastroesophageal reflux disease)   ? Hx of transient ischemic attack (TIA)   ? a. See stroke section.  ? Hyperlipidemia   ? Hypertension   ? Hypoxia   ? a. Has history of acute hypoxic respiratory failure in the setting of bronchitis/PNA or prior admissions.  ? MRSA infection   ? a. History of recurrent skin infection and soft tissue abscesses, with MRSA positive in the past.  ? 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 a. mitral valve - s/p resection and MV repair 06/2010 Dr. [name].  ? 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 Personal history of radiation therapy   ? Sleep apnea   ? does not use CPAP  ? Stenosis of middle cerebral artery   ?  a. Distal R MCA.  ? Stroke [facility])   ? a. 11/2009: mitral mass diagnosed at this time, also has distal R MCA stenosis, tx with coumadin. b. Readmitted 05/2010 with TIA symptoms - had not been taking Coumadin. s/p MV surgery 06/2010. Coumadin stopped 2013 after review of chart by Dr. [name] since mass was removed (stroke felt possibly related to this).   ? ?Past Surgical History:  ?Procedure Laterality Date  ? BREAST BIOPSY Right 08/26/2019   ? benign  ? BREAST LUMPECTOMY Right   ? BREAST LUMPECTOMY WITH RADIOACTIVE SEED AND SENTINEL LYMPH NODE BIOPSY Right 07/20/2017  ? Procedure: RIGHT BREAST LUMPECTOMY WITH RADIOACTIVE SEED AND SENTINEL LYMPH NODE BIOPSY;  Surgeon: Alphonsa Overall, MD;  Location: Home;  Service: General;  Laterality: Right;  ? CARDIAC CATHETERIZATION  04/03/2015  ? Procedure: Left Heart Cath and Cors/Grafts Angiography;  Surgeon: Peter M Martinique, MD;  Location: [place] CV LAB;  Service: Cardiovascular;;  ? CORONARY ARTERY BYPASS GRAFT  1/12  ? history of ankle fractures requiring surgery    ? LUMBAR LAMINECTOMY/DECOMPRESSION MICRODISCECTOMY Left 04/15/2015  ? Procedure: LUMBAR LAMINECTOMY/DECOMPRESSION MICRODISCECTOMY 1 LEVEL;  Surgeon: Newman Pies, MD;  Location: [place] NEURO ORS;  Service: Neurosurgery;  Laterality: Left;  Left L23 microdiskectomy  ? LUMBAR LAMINECTOMY/DECOMPRESSION MICRODISCECTOMY N/A 05/27/2020  ? Procedure: L2-3, L3-4  decompression;  Surgeon: [name], MD;  Location: [place];  Service: Orthopedics;  Laterality: N/A;  ? MV repair and resection of mass  1/12  ? RESECTION DISTAL CLAVICAL Left 05/16/2019  ? Procedure: RESECTION DISTAL CLAVICAL;  Surgeon: Mcarthur Rossetti, MD;  Location: [place];  Service: Orthopedics;  Laterality: Left;  ? SHOULDER ARTHROSCOPY WITH ROTATOR CUFF REPAIR AND SUBACROMIAL DECOMPRESSION Left 05/16/2019  ? Procedure: SHOULDER ARTHROSCOPY WITH ROTATOR CUFF REPAIR AND SUBACROMIAL DECOMPRESSION;  Surgeon: Mcarthur Rossetti, MD;  Location: [place];  Service: Orthopedics;  Laterality: Left;  ? TONSILLECTOMY  1971  ? TOTAL ABDOMINAL HYSTERECTOMY  1978  ? VESICOVAGINAL FISTULA CLOSURE W/ TAH    ? ? ?Allergies ? ?No Known Allergies ? ?History of Present Illness  ?  ?[name] is a 71 y.o. female who presents via audio/video conferencing for a telehealth visit today.  Pt was last seen in cardiology clinic on 04/27/2021, by Dr. Radford Pax.   At that time [name] was doing well .  she is now pending right ring finger a-1 pulley release.  Since his last visit, she continues to be stable from a cardiac standpoint. ? ?Today she denies chest pain, shortness of breath, lower extremity edema, fatigue, palpitations, melena, hematuria, hemoptysis, diaphoresis, weakness, presyncope, syncope, orthopnea, and PND. ? ? ? ?Home Medications  ?  ?Prior to Admission medications   ?Medication Sig Start Date End Date Taking? Authorizing Provider  ?Accu-Chek Softclix Lancets lancets Use as instructed 07/30/20   [name], MD  ?albuterol (VENTOLIN HFA) 108 (90 Base) MCG/ACT inhaler Inhale 2 puffs into the lungs every 6 (six) hours as needed for wheezing or shortness of breath. 07/29/20   [name], MD  ?amLODipine (NORVASC) 10 MG tablet TAKE 1 TABLET EVERY DAY 07/08/21   [name], MD  ?Ascorbic Acid (VITAMIN C PO) Take 500 mg by mouth daily.    [provider]  ?aspirin EC 81 MG tablet Take 1 tablet (81 mg total) by mouth daily. 06/26/14   Lance Bosch, NP  ?atorvastatin (LIPITOR) 80 MG tablet TAKE 1 TABLET EVERY DAY (  DOSE INCREASE) 07/08/21   [name], MD  ?Blood Glucose Monitoring Suppl (ACCU-CHEK AVIVA PLUS) w/Device KIT USE AS DIRECTED 05/31/21   [name], MD  ?buPROPion (WELLBUTRIN XL) 150 MG 24 hr tablet Take 1 tablet (150 mg total) by mouth 2 (two) times daily. 07/19/21   [name], MD  ?carvedilol (COREG) 25 MG tablet Take 1 tablet (25 mg total) by mouth 2 (two) times daily. 04/27/21 09/08/21  Sueanne Margarita, MD  ?cholecalciferol (VITAMIN D3) 25 MCG (1000 UNIT) tablet Take 1,000 Units by mouth daily.    [provider]  ?Continuous Blood Gluc Receiver (DEXCOM G6 RECEIVER) DEVI 1 Device by Does not apply route daily. 09/06/21   [name], MD  ?Continuous Blood Gluc Sensor (DEXCOM G6 SENSOR) MISC 1 packet by Does not apply route daily. 09/06/21   [name], MD   ?diclofenac Sodium (VOLTAREN) 1 % GEL APPLY 2 GRAMS TOPICALLY 4 (FOUR) TIMES DAILY AS NEEDED. 01/20/20   [name], MD  ?DULoxetine (CYMBALTA) 30 MG capsule Take 1 capsule (30 mg total) by mouth daily. 1/3

## 2021-09-13 ENCOUNTER — Telehealth: Payer: Self-pay | Admitting: Orthopaedic Surgery

## 2021-09-13 NOTE — Telephone Encounter (Signed)
Pt needs to know when she needs to stop taking Aspirin. She has her Pro-Op appt 3/17, pt did take Aspirin today. ?

## 2021-09-13 NOTE — Telephone Encounter (Signed)
Patient aware she doesn't have to stop her aspirin  ?

## 2021-09-13 NOTE — Telephone Encounter (Signed)
Patient called. She would like to know if she should stop taking her aspirin before surgery? Her call back number is [phone] ?

## 2021-09-13 NOTE — Telephone Encounter (Signed)
Pt aware. She will call the surgeons office to double check that it's ok to continue taking her ASA ?

## 2021-09-14 ENCOUNTER — Other Ambulatory Visit: Payer: Self-pay

## 2021-09-14 ENCOUNTER — Encounter (HOSPITAL_COMMUNITY): Payer: Self-pay | Admitting: Anesthesiology

## 2021-09-14 ENCOUNTER — Encounter (HOSPITAL_BASED_OUTPATIENT_CLINIC_OR_DEPARTMENT_OTHER)
Admission: RE | Admit: 2021-09-14 | Discharge: 2021-09-14 | Disposition: A | Discharge status: Home / Self Care | Payer: Medicare HMO | Source: Ambulatory Visit | Attending: Orthopaedic Surgery | Admitting: Orthopaedic Surgery

## 2021-09-14 ENCOUNTER — Ambulatory Visit: Payer: Medicare HMO | Attending: Internal Medicine

## 2021-09-14 DIAGNOSIS — N1831 Chronic kidney disease, stage 3a: Secondary | ICD-10-CM | POA: Diagnosis not present

## 2021-09-14 DIAGNOSIS — I25810 Atherosclerosis of coronary artery bypass graft(s) without angina pectoris: Secondary | ICD-10-CM | POA: Diagnosis not present

## 2021-09-14 LAB — BASIC METABOLIC PANEL
Anion gap: 7 (ref 5–15)
BUN: 32 mg/dL — ABNORMAL HIGH (ref 8–23)
CO2: 22 mmol/L (ref 22–32)
Calcium: 9.1 mg/dL (ref 8.9–10.3)
Chloride: 109 mmol/L (ref 98–111)
Creatinine, Ser: 1.5 mg/dL — ABNORMAL HIGH (ref 0.44–1.00)
GFR, Estimated: 37 mL/min — ABNORMAL LOW (ref >60)
Glucose, Bld: 144 mg/dL — ABNORMAL HIGH (ref 70–99)
Potassium: 5.6 mmol/L — ABNORMAL HIGH (ref 3.5–5.1)
Sodium: 138 mmol/L (ref 135–145)

## 2021-09-14 NOTE — Progress Notes (Signed)

## 2021-09-14 NOTE — Progress Notes (Signed)
K+5.6, will repeat day of surgery per Dr. Daiva Huge.  ?

## 2021-09-15 ENCOUNTER — Telehealth: Payer: Self-pay | Admitting: Internal Medicine

## 2021-09-15 DIAGNOSIS — I10 Essential (primary) hypertension: Secondary | ICD-10-CM

## 2021-09-15 DIAGNOSIS — E875 Hyperkalemia: Secondary | ICD-10-CM

## 2021-09-15 LAB — BASIC METABOLIC PANEL
BUN/Creatinine Ratio: 20 (ref 12–28)
BUN: 31 mg/dL — ABNORMAL HIGH (ref 8–27)
CO2: 20 mmol/L (ref 20–29)
Calcium: 9.2 mg/dL (ref 8.7–10.3)
Chloride: 105 mmol/L (ref 96–106)
Creatinine, Ser: 1.55 mg/dL — ABNORMAL HIGH (ref 0.57–1.00)
Glucose: 172 mg/dL — ABNORMAL HIGH (ref 70–99)
Potassium: 5.6 mmol/L — ABNORMAL HIGH (ref 3.5–5.2)
Sodium: 137 mmol/L (ref 134–144)
eGFR: 35 mL/min/{1.73_m2} — ABNORMAL LOW (ref >59)

## 2021-09-15 LAB — LIPID PANEL
Chol/HDL Ratio: 1.7 ratio (ref 0.0–4.4)
Cholesterol, Total: 184 mg/dL (ref 100–199)
HDL: 106 mg/dL (ref >39)
LDL Chol Calc (NIH): 69 mg/dL (ref 0–99)
Triglycerides: 44 mg/dL (ref 0–149)
VLDL Cholesterol Cal: 9 mg/dL (ref 5–40)

## 2021-09-15 MED ORDER — METFORMIN HCL 500 MG PO TABS: 500.0000 mg | ORAL_TABLET | Freq: Every day | ORAL | 2 refills | Status: DC

## 2021-09-15 MED ORDER — LOSARTAN POTASSIUM 100 MG PO TABS: 50.0000 mg | ORAL_TABLET | Freq: Every day | ORAL | 3 refills | Status: DC

## 2021-09-15 MED ORDER — HYDRALAZINE HCL 50 MG PO TABS: 50.0000 mg | ORAL_TABLET | Freq: Three times a day (TID) | ORAL | 4 refills | Status: DC

## 2021-09-15 MED ORDER — FREESTYLE LIBRE READER DEVI: 1.0000 | Freq: Once | 0 refills | Status: AC

## 2021-09-15 MED ORDER — FREESTYLE LIBRE SENSOR SYSTEM MISC: 12 refills | Status: DC

## 2021-09-15 NOTE — Telephone Encounter (Signed)
Phone call placed to patient today to go over lab results. ?Patient informed that her kidney function is abnormal and slightly declined again from when last checked in November.  Potassium level also elevated at 5.6. ?Recommend that she decrease consumption of potassium rich foods like bananas, oranges and orange juice. ?-Decrease Cozaar from 100 mg daily to 50 mg daily.  This means she will start taking half a tablet daily ?-Inquired whether she has the hydralazine and how is she taking it.  Patient tells me she is taking 25 mg 3 times a day.  Advised that we will increase it to 50 mg 3 times a day since we are decreasing the Cozaar. ?-Recommend drinking several glasses of water daily. ?-Decrease metformin from 500 mg twice a day to 500 mg once a day.  Increase Lantus to 12 units instead. ?-Her insurance did not cover for the Dexcom.  She is not checking blood sugars.  We will send prescription for the libre device to see if it is covered by her insurance. ?-Return to the lab in 1 week for repeat BMP. ?-Recommend delaying surgery tomorrow for trigger finger until her potassium level has normalized.  I will call Dr. Trevor Mace office to let them know. ? ?Patient was able to repeat back instructions and all questions were answered. ? ?

## 2021-09-16 ENCOUNTER — Ambulatory Visit (HOSPITAL_BASED_OUTPATIENT_CLINIC_OR_DEPARTMENT_OTHER): Admission: RE | Admit: 2021-09-16 | Payer: Medicare HMO | Source: Ambulatory Visit | Admitting: Orthopaedic Surgery

## 2021-09-16 ENCOUNTER — Other Ambulatory Visit: Payer: Self-pay

## 2021-09-16 DIAGNOSIS — E1142 Type 2 diabetes mellitus with diabetic polyneuropathy: Secondary | ICD-10-CM

## 2021-09-16 DIAGNOSIS — I10 Essential (primary) hypertension: Secondary | ICD-10-CM

## 2021-09-16 SURGERY — RELEASE, A1 PULLEY, FOR TRIGGER FINGER
Anesthesia: Monitor Anesthesia Care | Laterality: Right

## 2021-09-17 ENCOUNTER — Other Ambulatory Visit: Payer: Self-pay

## 2021-09-20 ENCOUNTER — Ambulatory Visit
Admission: RE | Admit: 2021-09-20 | Discharge: 2021-09-20 | Disposition: A | Discharge status: Home / Self Care | Payer: Medicare HMO | Source: Ambulatory Visit | Attending: Internal Medicine | Admitting: Internal Medicine

## 2021-09-20 DIAGNOSIS — Z1231 Encounter for screening mammogram for malignant neoplasm of breast: Secondary | ICD-10-CM

## 2021-09-20 NOTE — Assessment & Plan Note (Addendum)
07/21/17: Rt Lumpectomy: Grade 2 IDC 1.7 cm, 0/2 LN Neg, Margins Neg, ER 95%, PR 30%, Ki-67 15%, HER-2 negative ratio 1.36 T1cN0 Stage 1A ?? ?Pathology counseling: I discussed the final pathology report of the patient provided  a copy of this report. I discussed the margins as well as lymph node surgeries. We also discussed the final staging along with previously performed ER/PR and HER-2/neu testing. ?Oncotype DX recurrence score 14: Risk of distant recurrence of 9 years: 4%, low risk ?Adjuvant radiation 08/22/2017-09/18/2017 ?Adjuvant Radiation: 08/22/2017-09/15/2017 ?Adjuvant antiestrogen therapy with letrozole 2.5 mg daily started on 10/19/2017 ?_____________________________________________________________________________ ? ?[name] has no clinical or radiographic sign of breast cancer recurrence.  She continues on Letrozole daily and is tolerating it well.  She will continue this.   ? ?[name] will undergo mammogram in 05/2019 when due and will have bone density testing in 05/2020 when due.  We again reviewed bone health.  I suggested she ensure that she sees her surgeon in 6 months.  We will see her back in one year after her mammogram.    ? ?

## 2021-09-20 NOTE — Progress Notes (Signed)
[place] [facility] Follow up: ?  ? ?Stacy Pier, MD ?[address] ?[place] [address] ? ? ?DIAGNOSIS:  Cancer Staging  ?Malignant neoplasm of upper-outer quadrant of right breast in female, estrogen receptor positive ([place]) ?Staging form: Breast, AJCC 8th Edition ?- Clinical: Stage IA (cT1c, cN0, cM0, G2, ER+, PR+, HER2-) - Unsigned ?Histologic grading system: 3 grade system ?- Pathologic: Stage IA (pT1c, pN0, cM0, G2, ER+, PR+, HER2-) - Unsigned ?Histologic grading system: 3 grade system ? ? ?SUMMARY OF ONCOLOGIC HISTORY: ?Oncology History  ?Malignant neoplasm of upper-outer quadrant of right breast in female, estrogen receptor positive ([place])  ?06/12/2017 Initial Diagnosis  ? Right breast biopsy 10:00: IDC grade 2, ER 95%, PR 30%, Ki-67 15%, HER-2 negative ratio 1.36, 1.7 cm mass in the right upper outer quadrant, T1CN0 stage I a clinical stage ?  ?07/21/2017 Surgery  ? Rt Lumpectomy: Grade 2 IDC 1.7 cm, 0/2 LN Neg, Margins Neg, ER 95%, PR 30%, Ki-67 15%, HER-2 negative ratio 1.36 T1cN0 Stage 1A ?  ?08/22/2017 - 09/15/2017 Radiation Therapy  ? Adjuvant radiation therapy ?  ?08/23/2017 Oncotype testing  ? Oncotype DX recurrence score 14: Risk of distant recurrence of 9 years: 4%, low risk ?  ?10/19/2017 -  Anti-estrogen oral therapy  ? Letrozole daily ?  ? ? ?CURRENT THERAPY: Letrozole ? ?INTERVAL HISTORY: ?[name] 71 y.o. female returns for f/u for her h/o estrogen positive breast cancer.  She underwent bilateral mammogram yesterday that is incomplete due to possible asymmetry in the right breast. ? ?She is taking letrozole daily and tolerates it well.  She tells me that she is very active and stays active.  She is working on getting her diabetes under better control, along with her blood pressure.  She notes that she is on several different blood pressure medications and has been unable to get her blood pressure under control.  She is concerned about this.  She denies any  significant issues other than this.  She tells me that several years ago she was diagnosed with sleep apnea but had a change in her insurance plan and was unable to afford a CPAP machine. ? ? ?Patient Active Problem List  ? Diagnosis Date Noted  ? DM type 2 with diabetic peripheral neuropathy ([place]) 07/26/2021  ? Bilateral low back pain with bilateral sciatica 07/26/2021  ? Gait abnormality 07/26/2021  ? Controlled substance agreement signed 12/31/2020  ? Shoulder stiffness, left 07/07/2020  ? Status post lumbar spine surgery for decompression of spinal cord 06/09/2020  ? Lumbar stenosis 05/27/2020  ? Spinal stenosis of lumbar region 05/21/2019  ? Complete tear of left rotator cuff 05/16/2019  ? Uncontrolled type 2 diabetes mellitus with hyperglycemia ([place]) 12/19/2018  ? Microalbuminuria 04/08/2018  ? Genetic testing 09/25/2017  ? Family history of breast cancer   ? Family history of prostate cancer   ? Malignant neoplasm of upper-outer quadrant of right breast in female, estrogen receptor positive ([place]) 06/26/2017  ? Lumbar herniated disc 04/15/2015  ? Abnormal nuclear stress test 04/03/2015  ? 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 CAD (coronary artery disease), native coronary artery 03/30/2015  ? S/P mitral valve repair 11/16/2010  ? Dyslipidemia 06/29/2010  ? Diabetes ([place]) 09/29/2006  ? ANEMIA-IRON DEFICIENCY 09/29/2006  ? Essential hypertension 09/29/2006  ? ? ?has No Known Allergies. ? ?MEDICAL HISTORY: ?Past Medical History:  ?Diagnosis Date  ? Abscess in epidural space of L2-L5 lumbar spine 03/2006  ? Anemia,  iron deficiency   ? Arthritis   ? Breast cancer ([place]) 06/12/2017  ? right breast  ? CAD (coronary artery disease), native coronary artery 03/30/2015  ? Cataract   ? Colon polyp   ? a. Multiple colonic polyps status post colonoscopy in October 2007, consistent with tubular adenoma, tubulovillous adenoma with no high-grade dysplasia or malignancy identified.   ? Constipation   ? Coronary artery  disease   ? a. s/p CABG 06/2010: S-D2; S-PDA (at time of MV surgery).  ? Depression   ? Diabetes mellitus   ? type II  ? Family history of breast cancer   ? Family history of prostate cancer   ? Genetic testing 09/25/2017  ? Multi-Cancer panel (83 genes) @ [facility] - No pathogenic mutations detected  ? GERD (gastroesophageal reflux disease)   ? Hx of transient ischemic attack (TIA)   ? a. See stroke section.  ? Hyperlipidemia   ? Hypertension   ? Hypoxia   ? a. Has history of acute hypoxic respiratory failure in the setting of bronchitis/PNA or prior admissions.  ? MRSA infection   ? a. History of recurrent skin infection and soft tissue abscesses, with MRSA positive in the past.  ? 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 a. mitral valve - s/p resection and MV repair 06/2010 Dr. [name].  ? 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 Personal history of radiation therapy   ? Sleep apnea   ? does not use CPAP  ? Stenosis of middle cerebral artery   ? a. Distal R MCA.  ? Stroke [facility])   ? a. 11/2009: mitral mass diagnosed at this time, also has distal R MCA stenosis, tx with coumadin. b. Readmitted 05/2010 with TIA symptoms - had not been taking Coumadin. s/p MV surgery 06/2010. Coumadin stopped 2013 after review of chart by Dr. [name] since mass was removed (stroke felt possibly related to this).   ? ? ?SURGICAL HISTORY: ?Past Surgical History:  ?Procedure Laterality Date  ? BREAST BIOPSY Right 08/26/2019  ? benign  ? BREAST LUMPECTOMY Right   ? BREAST LUMPECTOMY WITH RADIOACTIVE SEED AND SENTINEL LYMPH NODE BIOPSY Right 07/20/2017  ? Procedure: RIGHT BREAST LUMPECTOMY WITH RADIOACTIVE SEED AND SENTINEL LYMPH NODE BIOPSY;  Surgeon: Alphonsa Overall, MD;  Location: [place];  Service: General;  Laterality: Right;  ? CARDIAC CATHETERIZATION  04/03/2015  ? Procedure: Left Heart Cath and Cors/Grafts Angiography;  Surgeon: Peter M Martinique, MD;  Location: [place] CV LAB;  Service: Cardiovascular;;  ? CORONARY ARTERY BYPASS GRAFT   1/12  ? history of ankle fractures requiring surgery    ? LUMBAR LAMINECTOMY/DECOMPRESSION MICRODISCECTOMY Left 04/15/2015  ? Procedure: LUMBAR LAMINECTOMY/DECOMPRESSION MICRODISCECTOMY 1 LEVEL;  Surgeon: Newman Pies, MD;  Location: [place] NEURO ORS;  Service: Neurosurgery;  Laterality: Left;  Left L23 microdiskectomy  ? LUMBAR LAMINECTOMY/DECOMPRESSION MICRODISCECTOMY N/A 05/27/2020  ? Procedure: L2-3, L3-4  decompression;  Surgeon: [name], MD;  Location: [place];  Service: Orthopedics;  Laterality: N/A;  ? MV repair and resection of mass  1/12  ? RESECTION DISTAL CLAVICAL Left 05/16/2019  ? Procedure: RESECTION DISTAL CLAVICAL;  Surgeon: Mcarthur Rossetti, MD;  Location: [place];  Service: Orthopedics;  Laterality: Left;  ? SHOULDER ARTHROSCOPY WITH ROTATOR CUFF REPAIR AND SUBACROMIAL DECOMPRESSION Left 05/16/2019  ? Procedure: SHOULDER ARTHROSCOPY WITH ROTATOR CUFF REPAIR AND SUBACROMIAL DECOMPRESSION;  Surgeon: Mcarthur Rossetti, MD;  Location: [place];  Service: Orthopedics;  Laterality: Left;  ?  TONSILLECTOMY  1971  ? TOTAL ABDOMINAL HYSTERECTOMY  1978  ? VESICOVAGINAL FISTULA CLOSURE W/ TAH    ? ? ?SOCIAL HISTORY: ?Social History  ? ?Socioeconomic History  ? Marital status: Married  ?  Spouse name: Not on file  ? Number of children: 3  ? Years of education: some college  ? Highest education level: Not on file  ?Occupational History  ?  Employer: UNEMPLOYED  ?Tobacco Use  ? Smoking status: Former  ?  Types: Cigarettes  ?  Quit date: 10/14/2010  ?  Years since quitting: 10.9  ? Smokeless tobacco: Never  ?Vaping Use  ? Vaping Use: Never used  ?Substance and Sexual Activity  ? Alcohol use: Yes  ?  Comment: social  ? Drug use: No  ? Sexual activity: Not Currently  ?Other Topics Concern  ? Not on file  ?Social History Narrative  ? Lives with husband, stay at home, uses cane occasionally, still active/ambulatory.   ? Left-handed.  ? No daily caffeine use.  ? ?Social  Determinants of Health  ? ?Financial Resource Strain: Not on file  ?Food Insecurity: No Food Insecurity  ? Worried About Charity fundraiser in the Last Year: Never true  ? Ran Out of Food in the Last [place]

## 2021-09-21 ENCOUNTER — Inpatient Hospital Stay: Payer: Medicare HMO | Attending: Adult Health | Admitting: Adult Health

## 2021-09-21 ENCOUNTER — Other Ambulatory Visit: Payer: Self-pay

## 2021-09-21 ENCOUNTER — Encounter: Payer: Self-pay | Admitting: Adult Health

## 2021-09-21 ENCOUNTER — Other Ambulatory Visit: Payer: Self-pay | Admitting: Internal Medicine

## 2021-09-21 VITALS — BP 178/64 | HR 73 | Temp 97.5°F | Resp 18 | Ht 68.0 in | Wt 251.9 lb

## 2021-09-21 DIAGNOSIS — Z170 Estrogen receptor positive status [ER+]: Secondary | ICD-10-CM

## 2021-09-21 DIAGNOSIS — R928 Other abnormal and inconclusive findings on diagnostic imaging of breast: Secondary | ICD-10-CM

## 2021-09-21 DIAGNOSIS — C50411 Malignant neoplasm of upper-outer quadrant of right female breast: Secondary | ICD-10-CM

## 2021-09-21 DIAGNOSIS — Z923 Personal history of irradiation: Secondary | ICD-10-CM | POA: Diagnosis not present

## 2021-09-21 DIAGNOSIS — Z79811 Long term (current) use of aromatase inhibitors: Secondary | ICD-10-CM | POA: Diagnosis not present

## 2021-09-21 DIAGNOSIS — G4733 Obstructive sleep apnea (adult) (pediatric): Secondary | ICD-10-CM | POA: Diagnosis not present

## 2021-09-22 ENCOUNTER — Telehealth: Payer: Self-pay | Admitting: Adult Health

## 2021-09-22 NOTE — Telephone Encounter (Signed)
Scheduled appointment per 3/28 los. Left message. ?

## 2021-09-23 ENCOUNTER — Encounter: Payer: Self-pay | Admitting: Podiatry

## 2021-09-23 ENCOUNTER — Ambulatory Visit (INDEPENDENT_AMBULATORY_CARE_PROVIDER_SITE_OTHER): Payer: Medicare HMO | Admitting: Podiatry

## 2021-09-23 DIAGNOSIS — M79675 Pain in left toe(s): Secondary | ICD-10-CM | POA: Diagnosis not present

## 2021-09-23 DIAGNOSIS — M79674 Pain in right toe(s): Secondary | ICD-10-CM

## 2021-09-23 DIAGNOSIS — B351 Tinea unguium: Secondary | ICD-10-CM

## 2021-09-23 NOTE — Progress Notes (Signed)
Subjective:  ? ?Patient ID: [name], female   DOB: 71 y.o.   MRN: [id number]  ? ?HPI ?Patient presents with severely elongated nailbeds 1-5 both feet that she cannot take care of.  Patient states this has been ongoing she has not been able to really get it under good control and it is gradually become more of an issue for her.  Patient does not smoke is not active ? ? ?Review of Systems  ?All other systems reviewed and are negative. ? ? ?   ?Objective:  ?Physical Exam ?Vitals and nursing note reviewed.  ?Constitutional:   ?   Appearance: She is well-developed.  ?Pulmonary:  ?   Effort: Pulmonary effort is normal.  ?Musculoskeletal:     ?   General: Normal range of motion.  ?Skin: ?   General: Skin is warm.  ?Neurological:  ?   Mental Status: She is alert.  ?  ?Neurovascular status intact muscle strength adequate range of motion adequate with patient found to have severely thickened elongated nailbeds 1-5 both feet that are dystrophic and moderately painful when pressed.  Patient is found to have good digital perfusion well oriented x3 ? ?   ?Assessment:  ?Mycotic nail infection with pain 1-5 both feet with several nails ingrown ? ?   ?Plan:  ?H&P reviewed condition and today did aggressive debridement of nailbeds 1 through 5 both feet and recommended that this can be done with consideration of nail removal at 1 point in future depending on response to this.  I debrided nailbeds 1-5 both feet no iatrogenic bleeding reappoint for routine care 3 months ?   ? ? ?

## 2021-09-27 ENCOUNTER — Ambulatory Visit: Payer: Medicare HMO | Attending: Internal Medicine

## 2021-09-27 DIAGNOSIS — E875 Hyperkalemia: Secondary | ICD-10-CM

## 2021-09-28 ENCOUNTER — Ambulatory Visit: Payer: Medicare HMO | Admitting: Pharmacist

## 2021-09-28 ENCOUNTER — Telehealth: Payer: Self-pay

## 2021-09-28 ENCOUNTER — Other Ambulatory Visit: Payer: Self-pay | Admitting: Internal Medicine

## 2021-09-28 LAB — BASIC METABOLIC PANEL
BUN/Creatinine Ratio: 18 (ref 12–28)
BUN: 23 mg/dL (ref 8–27)
CO2: 23 mmol/L (ref 20–29)
Calcium: 9.2 mg/dL (ref 8.7–10.3)
Chloride: 107 mmol/L — ABNORMAL HIGH (ref 96–106)
Creatinine, Ser: 1.31 mg/dL — ABNORMAL HIGH (ref 0.57–1.00)
Glucose: 124 mg/dL — ABNORMAL HIGH (ref 70–99)
Potassium: 4.9 mmol/L (ref 3.5–5.2)
Sodium: 142 mmol/L (ref 134–144)
eGFR: 43 mL/min/{1.73_m2} — ABNORMAL LOW (ref >59)

## 2021-09-28 MED ORDER — HYDRALAZINE HCL 50 MG PO TABS: 50.0000 mg | ORAL_TABLET | Freq: Three times a day (TID) | ORAL | 4 refills | Status: DC

## 2021-09-28 MED ORDER — LOSARTAN POTASSIUM 50 MG PO TABS: 50.0000 mg | ORAL_TABLET | Freq: Every day | ORAL | 1 refills | Status: DC

## 2021-09-28 NOTE — Telephone Encounter (Signed)
Contacted pt to go over lab results pt is aware and doesn't have any questions or concerns 

## 2021-09-29 ENCOUNTER — Encounter: Payer: Medicare HMO | Admitting: Orthopaedic Surgery

## 2021-10-05 ENCOUNTER — Ambulatory Visit: Admission: RE | Admit: 2021-10-05 | Payer: Medicare HMO | Source: Ambulatory Visit

## 2021-10-05 ENCOUNTER — Other Ambulatory Visit: Payer: Self-pay | Admitting: *Deleted

## 2021-10-05 ENCOUNTER — Ambulatory Visit
Admission: RE | Admit: 2021-10-05 | Discharge: 2021-10-05 | Disposition: A | Discharge status: Home / Self Care | Payer: Medicare HMO | Source: Ambulatory Visit | Attending: Internal Medicine | Admitting: Internal Medicine

## 2021-10-05 DIAGNOSIS — R928 Other abnormal and inconclusive findings on diagnostic imaging of breast: Secondary | ICD-10-CM

## 2021-10-05 DIAGNOSIS — R922 Inconclusive mammogram: Secondary | ICD-10-CM | POA: Diagnosis not present

## 2021-10-05 MED ORDER — DULOXETINE HCL 30 MG PO CPEP: 30.0000 mg | ORAL_CAPSULE | Freq: Every day | ORAL | 3 refills | Status: DC

## 2021-10-07 ENCOUNTER — Other Ambulatory Visit: Payer: Self-pay | Admitting: Internal Medicine

## 2021-10-07 DIAGNOSIS — G8929 Other chronic pain: Secondary | ICD-10-CM

## 2021-10-07 DIAGNOSIS — K219 Gastro-esophageal reflux disease without esophagitis: Secondary | ICD-10-CM

## 2021-10-14 ENCOUNTER — Telehealth: Payer: Self-pay | Admitting: Pharmacist

## 2021-10-14 NOTE — Telephone Encounter (Signed)
Patient appearing on report for [employer] - Hypertension Control report due to last documented ambulatory blood pressure of 178/64 on 09/21/21. Next appointment with PCP is 01/10/22  ? ?Outreached patient to discuss hypertension control and medication management.  ? ?Current antihypertensives: amlodipine 10 mg daily, hydralazine 50 mg three times daily, HCTZ 25 mg daily, losartan 50 mg daily - she reports she is taking 1/2 of a tablet, she is not at home so is unsure of the tablet strength. Per dispense history, she was previously dispensed 100 mg tablets but is now being dispensed 50 mg tablets, so is likely only taking 25 mg tablets ? ?Patient has an automated upper arm home BP machine, however, reports she has had "forever". Unsure of the brand and has never brought to a clinic for validation ? ?Current blood pressure readings: does not recall any numbers, but reports her home readings are lower than office readings.  ? ?Reports today that she has a history of untreated sleep apnea. She is going to reach out to follow up on sleep medicine referral next week when she is back in town ? ?Assessment/Plan: ?- Currently uncontrolled ?- - Reviewed goal blood pressure <130/80 ?- Reviewed appropriate home BP monitoring technique (avoid caffeine, smoking, and exercise for 30 minutes before checking, rest for at least 5 minutes before taking BP, sit with feet flat on the floor and back against a hard surface, uncross legs, and rest arm on flat surface) ?- Reviewed to check blood pressure daily, document, and provide at next provider visit ?- Recommend changing losartan to more potent valsartan/olmesartan, and also recommend changing HCTZ to chlorthalidone. Encouraged patient to continue to pursue sleep apnea evaluation. Patient is in agreement to schedule with the embedded pharmacist, but reports she would like to talk next week to schedule.  ? ?Will follow up in 1 week ? ?Catie Hedwig Morton, PharmD, BCACP ?[place] ?[phone] ? ?

## 2021-10-21 ENCOUNTER — Telehealth: Payer: Self-pay | Admitting: Pharmacist

## 2021-10-21 NOTE — Telephone Encounter (Signed)
Called patient back as requested last week. Scheduled for visit with the embedded pharmacist on 11/23/21. Encouraged to bring home cuff with her to the office for validation.  ? ?Catie Hedwig Morton, PharmD, BCACP ?[place] ?[phone] ? ?

## 2021-10-28 ENCOUNTER — Other Ambulatory Visit: Payer: Self-pay | Admitting: Internal Medicine

## 2021-10-28 MED ORDER — INSULIN GLARGINE 100 UNIT/ML SOLOSTAR PEN: 10.0000 [IU] | PEN_INJECTOR | Freq: Every day | SUBCUTANEOUS | 0 refills | Status: DC

## 2021-10-28 MED ORDER — NOVOLOG FLEXPEN 100 UNIT/ML ~~LOC~~ SOPN: PEN_INJECTOR | SUBCUTANEOUS | 0 refills | Status: DC

## 2021-10-28 NOTE — Telephone Encounter (Signed)
Requested Prescriptions  ?Pending Prescriptions Disp Refills  ?? insulin glargine (LANTUS) 100 UNIT/ML Solostar Pen 15 mL 0  ?  Sig: Inject 10 Units into the skin at bedtime.  ?  ? Endocrinology:  Diabetes - Insulins Passed - 10/28/2021  1:59 PM  ?  ?  Passed - HBA1C is between 0 and 7.9 and within 180 days  ?  HbA1c, POC (controlled diabetic range)  ?Date Value Ref Range Status  ?09/06/2021 7.9 (A) 0.0 - 7.0 % Final  ?   ?  ?  Passed - Valid encounter within last 6 months  ?  Recent Outpatient Visits   ?      ? 1 month ago Type 2 diabetes mellitus with obesity ([place])  ? [place] Karle Plumber B, MD  ? 5 months ago Type 2 diabetes mellitus with obesity ([place])  ? [place] Karle Plumber B, MD  ? 6 months ago Type 2 diabetes mellitus with obesity ([place])  ? [place], RPH-CPP  ? 6 months ago Type 2 diabetes mellitus with obesity ([place])  ? [place], RPH-CPP  ? 7 months ago Encounter for [employer] annual wellness exam  ? [place] [name], MD  ?  ?  ?Future Appointments   ?        ? In 3 weeks Daisy Blossom, Jarome Matin, [place]  ? In 2 months [name], MD [place]  ?  ? ?  ?  ?  ?? insulin aspart (NOVOLOG FLEXPEN) 100 UNIT/ML FlexPen 15 mL 0  ?  Sig: Inj 4 units subcut with the 2 larges meals of the day  ?  ? Endocrinology:  Diabetes - Insulins Passed - 10/28/2021  1:59 PM  ?  ?  Passed - HBA1C is between 0 and 7.9 and within 180 days  ?  HbA1c, POC (controlled diabetic range)  ?Date Value Ref Range Status  ?09/06/2021 7.9 (A) 0.0 - 7.0 % Final  ?   ?  ?  Passed - Valid encounter within last 6 months  ?  Recent Outpatient Visits   ?      ? 1 month ago Type 2 diabetes mellitus with obesity ([place])  ? [place] Karle Plumber B, MD  ? 5 months ago Type 2 diabetes mellitus with obesity ([place])  ? [place] Karle Plumber B, MD  ? 6 months ago Type 2 diabetes mellitus with obesity ([place])  ? [place], RPH-CPP  ? 6 months ago Type 2 diabetes mellitus with obesity ([place])  ? [place], RPH-CPP  ? 7 months ago Encounter for [employer] annual wellness exam  ? [place] [name], MD  ?  ?  ?Future Appointments   ?        ? In 3 weeks Daisy Blossom, Jarome Matin, [place]  ? In 2 months [name], MD [place]  ?  ? ?  ?  ?  ? ?

## 2021-10-28 NOTE — Telephone Encounter (Signed)
Pt states she is completely out of her ?insulin aspart (NOVOLOG FLEXPEN) 100 UNIT/ML FlexPen ? ?It was sent to [facility], but she has never received any of this insulin from [facility]. ?Pt states she has not had insulin in 2 days.  ?Would like to know if you can call [facility] and have all of the remaining refills sent to  ? ?[place] ([place]),  - [address] ? ?

## 2021-10-28 NOTE — Telephone Encounter (Signed)
[facility] called and spoke to Welda, Education administrator about the refill(s) Novolog requested. Advised it was sent on 04/07/21  with 11 refill(s).  She says the Rx was cancelled due to a co-pay issue. I asked about Lantus, she says same thing, cancelled. Patient called and advised of the above, she says she spoke to [facility] and was told she will need Rx sent for the Novolog. I advised both will be sent to [facility]. ?

## 2021-10-29 NOTE — Telephone Encounter (Signed)
Patient request changes to her Rx: Relion Novolog needs to be prescribed for insurnace coverage ?

## 2021-10-29 NOTE — Telephone Encounter (Signed)
Pt called to say the novolog that was called in  ?insulin aspart (NOVOLOG FLEXPEN) 100 UNIT/ML FlexPen has a higher copay than she is used to. ?Pt called [employer] and was told the dr needs to prescribe ?Relion Novolog ? ?This will give her a $17.00 copay. ? ?[place] ([place]), [place] - [address] ?

## 2021-11-01 ENCOUNTER — Ambulatory Visit: Payer: Self-pay

## 2021-11-01 MED ORDER — NOVOLOG FLEXPEN 100 UNIT/ML ~~LOC~~ SOPN: PEN_INJECTOR | SUBCUTANEOUS | 5 refills | Status: DC

## 2021-11-01 NOTE — Addendum Note (Signed)
Addended by: Karle Plumber B on: 11/01/2021 06:25 PM ? ? Modules accepted: Orders ? ?

## 2021-11-01 NOTE — Telephone Encounter (Signed)
?  Chief Complaint: pt out of insulin needs Relion Novolog ?Symptoms: n/a ?Frequency: has been out of insulin for 3 days ?Pertinent Negatives: Patient denies sx ?Disposition: '[]'$ ED /'[]'$ Urgent Care (no appt availability in office) / '[]'$ Appointment(In office/virtual)/ '[]'$  [facility]/ '[]'$ Home Care/ '[]'$ Refused Recommended Disposition /'[]'$ [facility]/ '[x]'$  Follow-up with PCP ?Additional Notes: sending note to office for Relion Novolog.  ?See Refill note dated 10/28/21 . Was routed high priority. ? ? ? ? ? ? ? ?Reason for Disposition ? Prescription request for new medicine (not a refill) ? ?Answer Assessment - Initial Assessment Questions ?1. DRUG NAME: "What medicine do you need to have refilled?" ?    Pt requesting Relion Novolog (see note dated 10/29/21 ?2. REFILLS REMAINING: "How many refills are remaining?" (Note: The label on the medicine or pill bottle will show how many refills are remaining. If there are no refills remaining, then a renewal may be needed.) ?    Pt has been out of insulin for 3 days ?3. EXPIRATION DATE: "What is the expiration date?" (Note: The label states when the prescription will expire, and thus can no longer be refilled.) ?    N/a ?4. PRESCRIBING HCP: "Who prescribed it?" Reason: If prescribed by specialist, call should be referred to that group. ?    Has not been prescribed yet ?5. SYMPTOMS: "Do you have any symptoms?" ?    N/a ?6. PREGNANCY: "Is there any chance that you are pregnant?" "When was your last menstrual period?" ?    N/a ? ?Protocols used: Medication Refill and Renewal Call-A-AH ? ?

## 2021-11-01 NOTE — Telephone Encounter (Signed)
Phone call placed to patient this afternoon to clarify.  Patient tells me that her insurance told her that the [place] brand is only $17.  She is requesting prescription be sent for the NovoLog pen ReliOn.  The only rely on I see in the system is for the NovoLog 70/30.  However patient tells me that her insurance told her that it the same NovoLog FlexPen but ReliOn brand.  I sent prescription to her pharmacy.  I told her that if there is any issue with the prescription, she can call us back tomorrow and I can have a clinical pharmacist help to figure it out.  She expressed understanding. ?

## 2021-11-01 NOTE — Telephone Encounter (Signed)
Patient is needing Novolin relion for her insurance to cover. ?

## 2021-11-14 ENCOUNTER — Other Ambulatory Visit: Payer: Self-pay | Admitting: Internal Medicine

## 2021-11-14 DIAGNOSIS — I10 Essential (primary) hypertension: Secondary | ICD-10-CM

## 2021-11-16 NOTE — Telephone Encounter (Signed)
Requested Prescriptions  Pending Prescriptions Disp Refills  . hydrochlorothiazide (HYDRODIURIL) 25 MG tablet [Pharmacy Med Name: HYDROCHLOROTHIAZIDE 25 MG Tablet] 90 tablet 1    Sig: TAKE 1 TABLET EVERY DAY     Cardiovascular: Diuretics - Thiazide Failed - 11/14/2021  3:33 AM      Failed - Cr in normal range and within 180 days    Creatinine  Date Value Ref Range Status  12/31/2020 267.1 20.0 - 300.0 mg/dL Final   Creat  Date Value Ref Range Status  04/28/2016 1.17 (H) 0.50 - 0.99 mg/dL Final    Comment:      For patients > or = 73 years of age: The upper reference limit for Creatinine is approximately 13% higher for people identified as African-American.      Creatinine, Ser  Date Value Ref Range Status  09/27/2021 1.31 (H) 0.57 - 1.00 mg/dL Final   Creatinine,U  Date Value Ref Range Status  06/05/2010  mg/dL Final   159.6 (NOTE)  Cutoff Values for Urine Drug Screen:        Drug Class           Cutoff (ng/mL)        Amphetamines            1000        Barbiturates             200        Cocaine Metabolites      300        Benzodiazepines          200        Methadone                 300        Opiates                 2000        Phencyclidine             25        Propoxyphene             300        Marijuana Metabolites     50  For medical purposes only.         Failed - Last BP in normal range    BP Readings from Last 1 Encounters:  09/21/21 (!) 178/64         Passed - K in normal range and within 180 days    Potassium  Date Value Ref Range Status  09/27/2021 4.9 3.5 - 5.2 mmol/L Final         Passed - Na in normal range and within 180 days    Sodium  Date Value Ref Range Status  09/27/2021 142 134 - 144 mmol/L Final         Passed - Valid encounter within last 6 months    Recent Outpatient Visits          2 months ago Type 2 diabetes mellitus with obesity ([place])   [place] Karle Plumber B, MD   6 months ago Type 2  diabetes mellitus with obesity [facility])   [place] Karle Plumber B, MD   6 months ago Type 2 diabetes mellitus with obesity [facility])   [place], Annie Main L, RPH-CPP   7 months ago Type 2 diabetes mellitus with obesity ([place])  [place], RPH-CPP   7 months ago Encounter for [employer] annual wellness exam   [place] [name], MD      Future Appointments            In 1 week Daisy Blossom, Jarome Matin, [place]   In 1 month Wynetta Emery, Dalbert Batman, MD [place]

## 2021-11-22 ENCOUNTER — Other Ambulatory Visit: Payer: Self-pay | Admitting: Hematology and Oncology

## 2021-11-23 ENCOUNTER — Ambulatory Visit: Payer: Medicare HMO | Attending: Internal Medicine | Admitting: Pharmacist

## 2021-11-23 ENCOUNTER — Encounter: Payer: Self-pay | Admitting: Pharmacist

## 2021-11-23 ENCOUNTER — Other Ambulatory Visit: Payer: Self-pay | Admitting: *Deleted

## 2021-11-23 VITALS — BP 158/71 | HR 58

## 2021-11-23 DIAGNOSIS — I10 Essential (primary) hypertension: Secondary | ICD-10-CM

## 2021-11-23 MED ORDER — CARVEDILOL 25 MG PO TABS: 25.0000 mg | ORAL_TABLET | Freq: Two times a day (BID) | ORAL | 3 refills | Status: DC

## 2021-11-23 MED ORDER — HYDRALAZINE HCL 100 MG PO TABS: 100.0000 mg | ORAL_TABLET | Freq: Two times a day (BID) | ORAL | 1 refills | Status: DC

## 2021-11-23 NOTE — Progress Notes (Signed)
S:     No chief complaint on file.   [name] is a 71 y.o. female who presents for hypertension evaluation, education, and management. PMH is significant for  HTN, DM, HL, depression, chronic back pain, RT breast CA (intraductal CA, ER/PR+, lumpectomy and XRT - completed 08/2017), CKD stage 3, CVA 2011 (residual weakness on LT side), CAD s/p CABG and MVR/resection of fibroelastoma, IDA. [name] Patient was referred and last seen by Primary Care Provider, Dr. Wynetta Emery, on 09/06/2021.   Today, patient arrives in good spirits and presents without assistance. Denies dizziness, headache, blurred vision, swelling.   Patient reports hypertension is longstanding.   Family/Social history:  Fhx: stroke, DM, kidney disease  Tobacco: former smoker (quit in 2012) Alcohol: none reported   Medication adherence reported. Patient has not taken BP medications today. Of note, she is only taking the hydralazine BID. She admits today that she does not take the middle dose but only takes 50 mg in the morning and 50 in the evening.   Current antihypertensives include: amlodipine 10 mg daily, carvedilol 25 mg BID, hydralazine 50 mg TID (only takes BID), HCTZ 25 mg daily, losartan 50 mg daily    Reported home BP readings: none with her today  Patient reported dietary habits:  - Not fully adherent to a low-sodium diet but has decreased her daily intake quite a bit - Denies excessive caffeine intake  - Admits to eating sweets occasionally   Patient-reported exercise habits: none  O:  Vitals:   11/23/21 1148  BP: (!) 158/71  Pulse: (!) 58    Last 3 Office BP readings: BP Readings from Last 3 Encounters:  11/23/21 (!) 158/71  09/21/21 (!) 178/64  09/06/21 (!) 160/75    BMET    Component Value Date/Time   NA 142 09/27/2021 1225   K 4.9 09/27/2021 1225   CL 107 (H) 09/27/2021 1225   CO2 23 09/27/2021 1225   GLUCOSE 124 (H) 09/27/2021 1225   GLUCOSE 144 (H) 09/14/2021 1136   BUN 23 09/27/2021  1225   CREATININE 1.31 (H) 09/27/2021 1225   CREATININE 1.17 (H) 04/28/2016 1028   CALCIUM 9.2 09/27/2021 1225   GFRNONAA 37 (L) 09/14/2021 1136   GFRNONAA 50 (L) 06/23/2015 1711   GFRAA 52 (L) 05/15/2020 1104   GFRAA 58 (L) 06/23/2015 1711    Renal function: CrCl cannot be calculated (Patient's most recent lab result is older than the maximum 21 days allowed.).  Clinical ASCVD: No  - no event but does have CAD The ASCVD Risk score (Arnett DK, et al., 2019) failed to calculate for the following reasons:   The valid HDL cholesterol range is 20 to 100 mg/dL  A/P: Hypertension longstanding currently above goal on current medications. BP goal < 130/80 mmHg. Medication adherence appears suboptimal. She is only taking hydralazine 50 mg BID. She cannot take the middle daily dose d/t her schedule. I recommend to increase this to 100 mg BID.  -Increased dose of hydralazine to 100 mg BID. -Continue other antihypertensives  as prescribed.    -F/u labs ordered - none. -Counseled on lifestyle modifications for blood pressure control including reduced dietary sodium, increased exercise, adequate sleep. -Encouraged patient to check BP at home and bring log of readings to next visit. Counseled on proper use of home BP cuff.   Results reviewed and written information provided. Patient verbalized understanding of treatment plan. Total time in face-to-face counseling 30 minutes.   F/u clinic visit in 1  month.   Benard Halsted, PharmD, [place], [place] [phone]

## 2021-11-24 ENCOUNTER — Encounter (HOSPITAL_BASED_OUTPATIENT_CLINIC_OR_DEPARTMENT_OTHER): Payer: Self-pay | Admitting: Orthopaedic Surgery

## 2021-11-24 ENCOUNTER — Other Ambulatory Visit: Payer: Self-pay | Admitting: Physician Assistant

## 2021-11-24 ENCOUNTER — Other Ambulatory Visit: Payer: Self-pay

## 2021-11-30 ENCOUNTER — Encounter (HOSPITAL_BASED_OUTPATIENT_CLINIC_OR_DEPARTMENT_OTHER)
Admission: RE | Admit: 2021-11-30 | Discharge: 2021-11-30 | Disposition: A | Discharge status: Home / Self Care | Payer: Medicare HMO | Source: Ambulatory Visit | Attending: Orthopaedic Surgery | Admitting: Orthopaedic Surgery

## 2021-11-30 DIAGNOSIS — E119 Type 2 diabetes mellitus without complications: Secondary | ICD-10-CM | POA: Diagnosis not present

## 2021-11-30 DIAGNOSIS — Z87891 Personal history of nicotine dependence: Secondary | ICD-10-CM | POA: Diagnosis not present

## 2021-11-30 DIAGNOSIS — Z794 Long term (current) use of insulin: Secondary | ICD-10-CM | POA: Diagnosis not present

## 2021-11-30 DIAGNOSIS — E669 Obesity, unspecified: Secondary | ICD-10-CM | POA: Diagnosis not present

## 2021-11-30 DIAGNOSIS — K219 Gastro-esophageal reflux disease without esophagitis: Secondary | ICD-10-CM | POA: Diagnosis not present

## 2021-11-30 DIAGNOSIS — I2510 Atherosclerotic heart disease of native coronary artery without angina pectoris: Secondary | ICD-10-CM | POA: Diagnosis not present

## 2021-11-30 DIAGNOSIS — M1990 Unspecified osteoarthritis, unspecified site: Secondary | ICD-10-CM | POA: Diagnosis not present

## 2021-11-30 DIAGNOSIS — M65341 Trigger finger, right ring finger: Secondary | ICD-10-CM | POA: Diagnosis not present

## 2021-11-30 DIAGNOSIS — Z7984 Long term (current) use of oral hypoglycemic drugs: Secondary | ICD-10-CM | POA: Diagnosis not present

## 2021-11-30 DIAGNOSIS — F32A Depression, unspecified: Secondary | ICD-10-CM | POA: Diagnosis not present

## 2021-11-30 DIAGNOSIS — I10 Essential (primary) hypertension: Secondary | ICD-10-CM | POA: Diagnosis not present

## 2021-11-30 DIAGNOSIS — Z8673 Personal history of transient ischemic attack (TIA), and cerebral infarction without residual deficits: Secondary | ICD-10-CM | POA: Diagnosis not present

## 2021-11-30 DIAGNOSIS — Z951 Presence of aortocoronary bypass graft: Secondary | ICD-10-CM | POA: Diagnosis not present

## 2021-11-30 LAB — BASIC METABOLIC PANEL
Anion gap: 9 (ref 5–15)
BUN: 28 mg/dL — ABNORMAL HIGH (ref 8–23)
CO2: 23 mmol/L (ref 22–32)
Calcium: 9.1 mg/dL (ref 8.9–10.3)
Chloride: 107 mmol/L (ref 98–111)
Creatinine, Ser: 1.7 mg/dL — ABNORMAL HIGH (ref 0.44–1.00)
GFR, Estimated: 32 mL/min — ABNORMAL LOW (ref >60)
Glucose, Bld: 119 mg/dL — ABNORMAL HIGH (ref 70–99)
Potassium: 5.1 mmol/L (ref 3.5–5.1)
Sodium: 139 mmol/L (ref 135–145)

## 2021-11-30 NOTE — Progress Notes (Signed)
° ° ° ° ° °  Patient Instructions ° °The night before surgery:  °No food after midnight. ONLY clear liquids after midnight ° °The day of surgery (if you do NOT have diabetes):  °Drink ONE (1) Pre-Surgery Clear Ensure as directed.   °This drink was given to you during your hospital  °pre-op appointment visit. °The pre-op nurse will instruct you on the time to drink the  °Pre-Surgery Ensure depending on your surgery time. °Finish the drink at the designated time by the pre-op nurse.  °Nothing else to drink after completing the  °Pre-Surgery Clear Ensure. ° °The day of surgery (if you have diabetes): °Drink ONE (1) Gatorade 2 (G2) as directed. °This drink was given to you during your hospital  °pre-op appointment visit.  °The pre-op nurse will instruct you on the time to drink the  ° Gatorade 2 (G2) depending on your surgery time. °Color of the Gatorade may vary. Red is not allowed. °Nothing else to drink after completing the  °Gatorade 2 (G2). ° °       If you have questions, please contact your surgeon’s office.  °

## 2021-12-01 NOTE — Anesthesia Preprocedure Evaluation (Addendum)
Anesthesia Evaluation  Patient identified by MRN, date of birth, ID band Patient awake    Reviewed: Allergy & Precautions, NPO status , Patient's Chart, lab work & pertinent test results, reviewed documented beta blocker date and time   History of Anesthesia Complications Negative for: history of anesthetic complications  Airway Mallampati: II  TM Distance: >3 FB Neck ROM: Full    Dental  (+) Dental Advisory Given   Pulmonary sleep apnea , former smoker,    Pulmonary exam normal        Cardiovascular hypertension, Pt. on medications and Pt. on home beta blockers + CAD and + CABG  Normal cardiovascular exam+ Valvular Problems/Murmurs    '21 TTE - EF 60 to 65%. There is moderate left ventricular hypertrophy. Grade II diastolic dysfunction (pseudonormalization). Status post mitral valve repair. No significant stenosis or regurgitation. Mild aortic valve sclerosis     Neuro/Psych PSYCHIATRIC DISORDERS Depression TIACVA, Residual Symptoms    GI/Hepatic Neg liver ROS, GERD  Medicated and Controlled,  Endo/Other  diabetes, Type 2, Insulin Dependent, Oral Hypoglycemic Agents Obesity   Renal/GU CRFRenal disease     Musculoskeletal  (+) Arthritis ,   Abdominal   Peds  Hematology negative hematology ROS (+)   Anesthesia Other Findings   Reproductive/Obstetrics  Breast cancer                             Anesthesia Physical Anesthesia Plan  ASA: 3  Anesthesia Plan: MAC   Post-op Pain Management: Tylenol PO (pre-op)* and Minimal or no pain anticipated   Induction:   PONV Risk Score and Plan: 2 and Propofol infusion and Treatment may vary due to age or medical condition  Airway Management Planned: Natural Airway and Simple Face Mask  Additional Equipment: None  Intra-op Plan:   Post-operative Plan:   Informed Consent: I have reviewed the patients History and Physical, chart, labs and  discussed the procedure including the risks, benefits and alternatives for the proposed anesthesia with the patient or authorized representative who has indicated his/her understanding and acceptance.       Plan Discussed with: CRNA, Anesthesiologist and Surgeon  Anesthesia Plan Comments:        Anesthesia Quick Evaluation

## 2021-12-02 ENCOUNTER — Ambulatory Visit (HOSPITAL_BASED_OUTPATIENT_CLINIC_OR_DEPARTMENT_OTHER): Payer: Medicare HMO | Admitting: Anesthesiology

## 2021-12-02 ENCOUNTER — Encounter (HOSPITAL_BASED_OUTPATIENT_CLINIC_OR_DEPARTMENT_OTHER)
Admission: RE | Disposition: A | Discharge status: Home / Self Care | Payer: Self-pay | Source: Home / Self Care | Attending: Orthopaedic Surgery

## 2021-12-02 ENCOUNTER — Other Ambulatory Visit: Payer: Self-pay

## 2021-12-02 ENCOUNTER — Ambulatory Visit (HOSPITAL_BASED_OUTPATIENT_CLINIC_OR_DEPARTMENT_OTHER)
Admission: RE | Admit: 2021-12-02 | Discharge: 2021-12-02 | Disposition: A | Discharge status: Home / Self Care | Payer: Medicare HMO | Attending: Orthopaedic Surgery | Admitting: Orthopaedic Surgery

## 2021-12-02 ENCOUNTER — Encounter (HOSPITAL_BASED_OUTPATIENT_CLINIC_OR_DEPARTMENT_OTHER): Payer: Self-pay | Admitting: Orthopaedic Surgery

## 2021-12-02 ENCOUNTER — Other Ambulatory Visit: Payer: Self-pay | Admitting: Internal Medicine

## 2021-12-02 DIAGNOSIS — I2510 Atherosclerotic heart disease of native coronary artery without angina pectoris: Secondary | ICD-10-CM | POA: Diagnosis not present

## 2021-12-02 DIAGNOSIS — Z794 Long term (current) use of insulin: Secondary | ICD-10-CM | POA: Insufficient documentation

## 2021-12-02 DIAGNOSIS — Z7984 Long term (current) use of oral hypoglycemic drugs: Secondary | ICD-10-CM | POA: Diagnosis not present

## 2021-12-02 DIAGNOSIS — I10 Essential (primary) hypertension: Secondary | ICD-10-CM | POA: Insufficient documentation

## 2021-12-02 DIAGNOSIS — Z8673 Personal history of transient ischemic attack (TIA), and cerebral infarction without residual deficits: Secondary | ICD-10-CM | POA: Insufficient documentation

## 2021-12-02 DIAGNOSIS — Z87891 Personal history of nicotine dependence: Secondary | ICD-10-CM | POA: Insufficient documentation

## 2021-12-02 DIAGNOSIS — E669 Obesity, unspecified: Secondary | ICD-10-CM | POA: Insufficient documentation

## 2021-12-02 DIAGNOSIS — M65341 Trigger finger, right ring finger: Secondary | ICD-10-CM

## 2021-12-02 DIAGNOSIS — G4730 Sleep apnea, unspecified: Secondary | ICD-10-CM

## 2021-12-02 DIAGNOSIS — M1990 Unspecified osteoarthritis, unspecified site: Secondary | ICD-10-CM | POA: Insufficient documentation

## 2021-12-02 DIAGNOSIS — E782 Mixed hyperlipidemia: Secondary | ICD-10-CM

## 2021-12-02 DIAGNOSIS — E119 Type 2 diabetes mellitus without complications: Secondary | ICD-10-CM | POA: Insufficient documentation

## 2021-12-02 DIAGNOSIS — I6990 Unspecified sequelae of unspecified cerebrovascular disease: Secondary | ICD-10-CM | POA: Diagnosis not present

## 2021-12-02 DIAGNOSIS — Z951 Presence of aortocoronary bypass graft: Secondary | ICD-10-CM | POA: Diagnosis not present

## 2021-12-02 DIAGNOSIS — M65322 Trigger finger, left index finger: Secondary | ICD-10-CM

## 2021-12-02 DIAGNOSIS — K219 Gastro-esophageal reflux disease without esophagitis: Secondary | ICD-10-CM | POA: Diagnosis not present

## 2021-12-02 DIAGNOSIS — F32A Depression, unspecified: Secondary | ICD-10-CM | POA: Insufficient documentation

## 2021-12-02 HISTORY — PX: TRIGGER FINGER RELEASE: SHX641

## 2021-12-02 LAB — GLUCOSE, CAPILLARY
Glucose-Capillary: 156 mg/dL — ABNORMAL HIGH (ref 70–99)
Glucose-Capillary: 160 mg/dL — ABNORMAL HIGH (ref 70–99)

## 2021-12-02 SURGERY — RELEASE, A1 PULLEY, FOR TRIGGER FINGER
Anesthesia: Monitor Anesthesia Care | Site: Hand | Laterality: Right

## 2021-12-02 MED ORDER — CEFAZOLIN SODIUM-DEXTROSE 2-4 GM/100ML-% IV SOLN
2.0000 g | INTRAVENOUS | Status: AC
  Administered 2021-12-02: 2 g via INTRAVENOUS

## 2021-12-02 MED ORDER — PROPOFOL 10 MG/ML IV BOLUS
INTRAVENOUS | Status: AC
  Filled 2021-12-02: qty 20

## 2021-12-02 MED ORDER — LIDOCAINE 2% (20 MG/ML) 5 ML SYRINGE
INTRAMUSCULAR | Status: AC
  Filled 2021-12-02: qty 5

## 2021-12-02 MED ORDER — BUPIVACAINE HCL (PF) 0.25 % IJ SOLN
INTRAMUSCULAR | Status: AC
  Filled 2021-12-02: qty 30

## 2021-12-02 MED ORDER — ONDANSETRON HCL 4 MG/2ML IJ SOLN
INTRAMUSCULAR | Status: AC
  Filled 2021-12-02: qty 2

## 2021-12-02 MED ORDER — ONDANSETRON HCL 4 MG/2ML IJ SOLN: 4.0000 mg | Freq: Once | INTRAMUSCULAR | Status: DC | PRN

## 2021-12-02 MED ORDER — LIDOCAINE HCL (CARDIAC) PF 100 MG/5ML IV SOSY
PREFILLED_SYRINGE | INTRAVENOUS | Status: DC | PRN
  Administered 2021-12-02: 40 mg via INTRAVENOUS

## 2021-12-02 MED ORDER — CEFAZOLIN SODIUM-DEXTROSE 2-4 GM/100ML-% IV SOLN
INTRAVENOUS | Status: AC
  Filled 2021-12-02: qty 100

## 2021-12-02 MED ORDER — ACETAMINOPHEN 500 MG PO TABS
ORAL_TABLET | ORAL | Status: AC
Start: 2021-12-02 — End: ?
  Filled 2021-12-02: qty 2

## 2021-12-02 MED ORDER — FENTANYL CITRATE (PF) 100 MCG/2ML IJ SOLN
INTRAMUSCULAR | Status: AC
  Filled 2021-12-02: qty 2

## 2021-12-02 MED ORDER — OXYCODONE HCL 5 MG/5ML PO SOLN: 5.0000 mg | Freq: Once | ORAL | Status: DC | PRN

## 2021-12-02 MED ORDER — HYDROCODONE-ACETAMINOPHEN 5-325 MG PO TABS: 1.0000 | ORAL_TABLET | Freq: Four times a day (QID) | ORAL | 0 refills | Status: DC | PRN

## 2021-12-02 MED ORDER — PROPOFOL 500 MG/50ML IV EMUL
INTRAVENOUS | Status: AC
  Filled 2021-12-02: qty 50

## 2021-12-02 MED ORDER — PROPOFOL 500 MG/50ML IV EMUL
INTRAVENOUS | Status: DC | PRN
  Administered 2021-12-02: 50 ug/kg/min via INTRAVENOUS

## 2021-12-02 MED ORDER — ONDANSETRON HCL 4 MG/2ML IJ SOLN
INTRAMUSCULAR | Status: DC | PRN
  Administered 2021-12-02: 4 mg via INTRAVENOUS

## 2021-12-02 MED ORDER — LACTATED RINGERS IV SOLN: INTRAVENOUS | Status: DC

## 2021-12-02 MED ORDER — LIDOCAINE HCL (PF) 1 % IJ SOLN
INTRAMUSCULAR | Status: AC
  Filled 2021-12-02: qty 30

## 2021-12-02 MED ORDER — FENTANYL CITRATE (PF) 100 MCG/2ML IJ SOLN
INTRAMUSCULAR | Status: DC | PRN
  Administered 2021-12-02: 50 ug via INTRAVENOUS

## 2021-12-02 MED ORDER — DEXAMETHASONE SODIUM PHOSPHATE 10 MG/ML IJ SOLN
INTRAMUSCULAR | Status: AC
  Filled 2021-12-02: qty 1

## 2021-12-02 MED ORDER — MIDAZOLAM HCL 5 MG/5ML IJ SOLN
INTRAMUSCULAR | Status: DC | PRN
  Administered 2021-12-02 (×2): 1 mg via INTRAVENOUS

## 2021-12-02 MED ORDER — PROPOFOL 10 MG/ML IV BOLUS
INTRAVENOUS | Status: DC | PRN
  Administered 2021-12-02: 30 mg via INTRAVENOUS
  Administered 2021-12-02: 10 mg via INTRAVENOUS

## 2021-12-02 MED ORDER — FENTANYL CITRATE (PF) 100 MCG/2ML IJ SOLN: 25.0000 ug | INTRAMUSCULAR | Status: DC | PRN

## 2021-12-02 MED ORDER — OXYCODONE HCL 5 MG PO TABS: 5.0000 mg | ORAL_TABLET | Freq: Once | ORAL | Status: DC | PRN

## 2021-12-02 MED ORDER — BUPIVACAINE HCL (PF) 0.25 % IJ SOLN
INTRAMUSCULAR | Status: DC | PRN
  Administered 2021-12-02: 7 mL

## 2021-12-02 MED ORDER — MIDAZOLAM HCL 2 MG/2ML IJ SOLN
INTRAMUSCULAR | Status: AC
  Filled 2021-12-02: qty 2

## 2021-12-02 MED ORDER — ACETAMINOPHEN 500 MG PO TABS
1000.0000 mg | ORAL_TABLET | Freq: Once | ORAL | Status: AC
  Administered 2021-12-02: 1000 mg via ORAL

## 2021-12-02 SURGICAL SUPPLY — 38 items
BLADE SURG 15 STRL LF DISP TIS (BLADE) ×2 IMPLANT
BLADE SURG 15 STRL SS (BLADE) ×4
BNDG CMPR 75X21 PLY HI ABS (MISCELLANEOUS) ×1
BNDG CMPR 9X4 STRL LF SNTH (GAUZE/BANDAGES/DRESSINGS)
BNDG COHESIVE 1X5 TAN STRL LF (GAUZE/BANDAGES/DRESSINGS) ×2 IMPLANT
BNDG ELASTIC 3X5.8 VLCR STR LF (GAUZE/BANDAGES/DRESSINGS) ×1 IMPLANT
BNDG ESMARK 4X9 LF (GAUZE/BANDAGES/DRESSINGS) IMPLANT
CORD BIPOLAR FORCEPS 12FT (ELECTRODE) ×2 IMPLANT
COVER BACK TABLE 60X90IN (DRAPES) ×2 IMPLANT
COVER MAYO STAND STRL (DRAPES) ×2 IMPLANT
CUFF TOURN SGL QUICK 18X4 (TOURNIQUET CUFF) IMPLANT
CUFF TOURN SGL QUICK 24 (TOURNIQUET CUFF)
CUFF TRNQT CYL 24X4X16.5-23 (TOURNIQUET CUFF) IMPLANT
DRAPE EXTREMITY T 121X128X90 (DISPOSABLE) ×2 IMPLANT
DRAPE SURG 17X23 STRL (DRAPES) ×2 IMPLANT
DURAPREP 26ML APPLICATOR (WOUND CARE) ×2 IMPLANT
GAUZE SPONGE 4X4 12PLY STRL (GAUZE/BANDAGES/DRESSINGS) ×2 IMPLANT
GAUZE STRETCH 2X75IN STRL (MISCELLANEOUS) ×2 IMPLANT
GAUZE XEROFORM 1X8 LF (GAUZE/BANDAGES/DRESSINGS) ×2 IMPLANT
GLOVE BIO SURGEON STRL SZ7.5 (GLOVE) ×4 IMPLANT
GLOVE BIOGEL PI IND STRL 8 (GLOVE) ×2 IMPLANT
GLOVE BIOGEL PI INDICATOR 8 (GLOVE) ×2
GOWN STRL REUS W/ TWL LRG LVL3 (GOWN DISPOSABLE) ×1 IMPLANT
GOWN STRL REUS W/TWL LRG LVL3 (GOWN DISPOSABLE) ×2
GOWN STRL REUS W/TWL XL LVL3 (GOWN DISPOSABLE) ×4 IMPLANT
NDL HYPO 25X1 1.5 SAFETY (NEEDLE) IMPLANT
NEEDLE HYPO 25X1 1.5 SAFETY (NEEDLE) IMPLANT
NS IRRIG 1000ML POUR BTL (IV SOLUTION) ×2 IMPLANT
PACK BASIN DAY SURGERY FS (CUSTOM PROCEDURE TRAY) ×2 IMPLANT
PAD CAST 3X4 CTTN HI CHSV (CAST SUPPLIES) ×1 IMPLANT
PADDING CAST COTTON 3X4 STRL (CAST SUPPLIES) ×2
SLEEVE SCD COMPRESS KNEE MED (STOCKING) IMPLANT
STOCKINETTE 4X48 STRL (DRAPES) ×2 IMPLANT
SUT ETHILON 3 0 PS 1 (SUTURE) ×2 IMPLANT
SYR BULB EAR ULCER 3OZ GRN STR (SYRINGE) ×2 IMPLANT
SYR CONTROL 10ML LL (SYRINGE) IMPLANT
TOWEL GREEN STERILE FF (TOWEL DISPOSABLE) ×2 IMPLANT
UNDERPAD 30X36 HEAVY ABSORB (UNDERPADS AND DIAPERS) ×2 IMPLANT

## 2021-12-02 NOTE — Brief Op Note (Signed)
12/02/2021  7:45 AM  PATIENT:  Gerarda Gunther Noguchi  71 y.o. female  PRE-OPERATIVE DIAGNOSIS:  right ring finger trigger finger  POST-OPERATIVE DIAGNOSIS:  right ring finger trigger finger  PROCEDURE:  Procedure(s): RIGHT RING FINGER A-1 PULLEY RELEASE (Right)  SURGEON:  Surgeon(s) and Role:    Mcarthur Rossetti, MD - Primary  PHYSICIAN ASSISTANT:  Benita Stabile, PA-C  ANESTHESIA:   local and IV sedation  COUNTS:  YES  TOURNIQUET:  * Missing tourniquet times found for documented tourniquets in log: [id number] *  DICTATION: .Other Dictation: Dictation Number [id number]  PLAN OF CARE: Discharge to home after PACU  PATIENT DISPOSITION:  PACU - hemodynamically stable.   Delay start of Pharmacological VTE agent (>24hrs) due to surgical blood loss or risk of bleeding: no

## 2021-12-02 NOTE — Anesthesia Procedure Notes (Signed)
Procedure Name: MAC Date/Time: 12/02/2021 7:25 AM  Performed by: Glory Buff, CRNAOxygen Delivery Method: Simple face mask

## 2021-12-02 NOTE — H&P (Signed)
[name] is an 71 y.o. female.   Chief Complaint:   Triggering right ring finger with pain HPI: The patient is a 71 year old female well-known to me.  She has triggering and pain associated with the right ring finger at the A1 pulley.  She has tried and failed conservative treatment at this point wishes to proceed with an A1 pulley release of the right ring finger.  She has had this before on her left ring finger.  Past Medical History:  Diagnosis Date   Abscess in epidural space of L2-L5 lumbar spine 03/2006   Anemia, iron deficiency    Arthritis    Breast cancer ([place]) 06/12/2017   right breast   CAD (coronary artery disease), native coronary artery 03/30/2015   Cataract    Colon polyp    a. Multiple colonic polyps status post colonoscopy in October 2007, consistent with tubular adenoma, tubulovillous adenoma with no high-grade dysplasia or malignancy identified.    Constipation    Coronary artery disease    a. s/p CABG 06/2010: S-D2; S-PDA (at time of MV surgery).   Depression    Diabetes mellitus    type II   Family history of breast cancer    Family history of prostate cancer    Genetic testing 09/25/2017   Multi-Cancer panel (83 genes) @ [facility] - No pathogenic mutations detected   GERD (gastroesophageal reflux disease)    Hx of transient ischemic attack (TIA)    a. See stroke section.   Hyperlipidemia    Hypertension    Hypoxia    a. Has history of acute hypoxic respiratory failure in the setting of bronchitis/PNA or prior admissions.   MRSA infection    a. History of recurrent skin infection and soft tissue abscesses, with MRSA positive in the past.   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 mitral valve - s/p resection and MV repair 06/2010 Dr. [name].   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 Distal R MCA.   Stroke [facility])    a. 11/2009:  mitral mass diagnosed at this time, also has distal R MCA stenosis, tx with coumadin. b. Readmitted 05/2010 with TIA symptoms - had not been taking Coumadin. s/p MV surgery 06/2010. Coumadin stopped 2013 after review of chart by Dr. [name] since mass was removed (stroke felt possibly related to this).     Past Surgical History:  Procedure Laterality Date   BREAST BIOPSY Right 08/26/2019   benign   BREAST LUMPECTOMY Right    BREAST LUMPECTOMY WITH RADIOACTIVE SEED AND SENTINEL LYMPH NODE BIOPSY Right 07/20/2017   Procedure: RIGHT BREAST LUMPECTOMY WITH RADIOACTIVE SEED AND SENTINEL LYMPH NODE BIOPSY;  Surgeon: Alphonsa Overall, MD;  Location: [facility];  Service: General;  Laterality: Right;   CARDIAC CATHETERIZATION  04/03/2015   Procedure: Left Heart Cath and Cors/Grafts Angiography;  Surgeon: Peter M Martinique, MD;  Location: [facility];  Service: Cardiovascular;;   CORONARY ARTERY BYPASS GRAFT  1/12   history of ankle fractures requiring surgery     LUMBAR LAMINECTOMY/DECOMPRESSION MICRODISCECTOMY Left 04/15/2015   Procedure: LUMBAR LAMINECTOMY/DECOMPRESSION MICRODISCECTOMY 1 LEVEL;  Surgeon: Newman Pies, MD;  Location: [place] [facility];  Service: Neurosurgery;  Laterality: Left;  Left L23 microdiskectomy   LUMBAR LAMINECTOMY/DECOMPRESSION MICRODISCECTOMY N/A 05/27/2020   Procedure: L2-3, L3-4  decompression;  Surgeon: [name],  Thana Farr, MD;  Location: [facility];  Service: Orthopedics;  Laterality: N/A;   MV repair and resection of mass  1/12   RESECTION DISTAL CLAVICAL Left 05/16/2019   Procedure: RESECTION DISTAL CLAVICAL;  Surgeon: Mcarthur Rossetti, MD;  Location: [facility];  Service: Orthopedics;  Laterality: Left;   SHOULDER ARTHROSCOPY WITH ROTATOR CUFF REPAIR AND SUBACROMIAL DECOMPRESSION Left 05/16/2019   Procedure: SHOULDER ARTHROSCOPY WITH ROTATOR CUFF REPAIR AND SUBACROMIAL DECOMPRESSION;  Surgeon: Mcarthur Rossetti, MD;  Location: [place] [facility];   Service: Orthopedics;  Laterality: Left;   TONSILLECTOMY  1971   TOTAL ABDOMINAL HYSTERECTOMY  1978   VESICOVAGINAL FISTULA CLOSURE W/ TAH      Family History  Problem Relation Age of Onset   Colon cancer Mother        dx 31s; deceased 35   Irritable bowel syndrome Mother    Prostate cancer Father        deceased 83s   Cirrhosis Sister        died of GI bleed associated with cirrhosis of the liver   Stroke Brother    Prostate cancer Brother 20       deceased 30   Diabetes Brother        x3   Kidney disease Brother    Cancer Brother 21       unk. type   Breast cancer Cousin        several maternal 1st and 2nd cousins with breast cancer   Social History:  reports that she quit smoking about 11 years ago. Her smoking use included cigarettes. She has never used smokeless tobacco. She reports current alcohol use. She reports that she does not use drugs.  Allergies: No Known Allergies  Medications Prior to Admission  Medication Sig Dispense Refill   albuterol (VENTOLIN HFA) 108 (90 Base) MCG/ACT inhaler Inhale 2 puffs into the lungs every 6 (six) hours as needed for wheezing or shortness of breath. 1 each 2   amLODipine (NORVASC) 10 MG tablet TAKE 1 TABLET EVERY DAY 90 tablet 0   Ascorbic Acid (VITAMIN C PO) Take 500 mg by mouth daily.     aspirin EC 81 MG tablet Take 1 tablet (81 mg total) by mouth daily. 30 tablet 5   atorvastatin (LIPITOR) 80 MG tablet TAKE 1 TABLET EVERY DAY (DOSE INCREASE) 90 tablet 0   buPROPion (WELLBUTRIN XL) 150 MG 24 hr tablet Take 1 tablet (150 mg total) by mouth 2 (two) times daily. 28 tablet 0   carvedilol (COREG) 25 MG tablet Take 1 tablet (25 mg total) by mouth 2 (two) times daily. 180 tablet 3   cholecalciferol (VITAMIN D3) 25 MCG (1000 UNIT) tablet Take 1,000 Units by mouth daily.     diclofenac Sodium (VOLTAREN) 1 % GEL APPLY 2 GRAMS TOPICALLY 4 (FOUR) TIMES DAILY AS NEEDED. 100 g 0   DULoxetine (CYMBALTA) 30 MG capsule Take 1 capsule (30 mg  total) by mouth daily. 90 capsule 3   ferrous sulfate (FEROSUL) 325 (65 FE) MG tablet TAKE 1 TABLET (325 MG TOTAL) BY MOUTH DAILY WITH BREAKFAST. (Patient taking differently: Take 325 mg by mouth daily. TAKE 1 TABLET (325 MG TOTAL) BY MOUTH DAILY WITH BREAKFAST.) 90 tablet 0   gabapentin (NEURONTIN) 100 MG capsule Take 1 capsule (100 mg total) by mouth 3 (three) times daily. 90 capsule 11   hydrALAZINE (APRESOLINE) 100 MG tablet Take 1 tablet (100 mg total) by mouth 2 (two) times daily.  180 tablet 1   hydrochlorothiazide (HYDRODIURIL) 25 MG tablet TAKE 1 TABLET EVERY DAY 90 tablet 0   insulin aspart (NOVOLOG FLEXPEN) 100 UNIT/ML FlexPen Please give Relion Brand. Inj 4 units subcut with the 2 larges meals of the day 15 mL 5   insulin glargine (LANTUS) 100 UNIT/ML Solostar Pen Inject 10 Units into the skin at bedtime. 15 mL 0   Insulin Pen Needle (PEN NEEDLES) 31G X 8 MM MISC UAD 100 each 6   letrozole (FEMARA) 2.5 MG tablet TAKE 1 TABLET EVERY DAY (APPOINTMENT IS NEEDED FOR REFILLS) 90 tablet 3   losartan (COZAAR) 50 MG tablet Take 1 tablet (50 mg total) by mouth daily. 90 tablet 1   metFORMIN (GLUCOPHAGE) 500 MG tablet Take 1 tablet (500 mg total) by mouth daily with breakfast. 90 tablet 2   methocarbamol (ROBAXIN) 500 MG tablet TAKE 1 TABLET EVERY 8 HOURS AS NEEDED FOR MUSCLE SPASMS. 60 tablet 1   pantoprazole (PROTONIX) 40 MG tablet TAKE 1 TABLET EVERY DAY 90 tablet 1   traZODone (DESYREL) 50 MG tablet TAKE 1 TABLET AT BEDTIME AS NEEDED FOR SLEEP 90 tablet 0   Accu-Chek Softclix Lancets lancets Use as instructed 100 each 12   Blood Glucose Monitoring Suppl (ACCU-CHEK AVIVA PLUS) w/Device KIT USE AS DIRECTED 1 kit 0   Continuous Blood Gluc Receiver (DEXCOM G6 RECEIVER) DEVI 1 Device by Does not apply route daily. 1 each 0   Continuous Blood Gluc Sensor (DEXCOM G6 SENSOR) MISC 1 packet by Does not apply route daily. 3 each 11   Continuous Blood Gluc Sensor (FREESTYLE LIBRE SENSOR SYSTEM) MISC  Change sensor Q 2 wks 2 each 12   glucose blood (ACCU-CHEK AVIVA PLUS) test strip Use as instructed 100 each 12    Results for orders placed or performed during the hospital encounter of 12/02/21 (from the past 48 hour(s))  Basic metabolic panel per protocol     Status: Abnormal   Collection Time: 11/30/21 11:05 AM  Result Value Ref Range   Sodium 139 135 - 145 mmol/L   Potassium 5.1 3.5 - 5.1 mmol/L   Chloride 107 98 - 111 mmol/L   CO2 23 22 - 32 mmol/L   Glucose, Bld 119 (H) 70 - 99 mg/dL    Comment: Glucose reference range applies only to samples taken after fasting for at least 8 hours.   BUN 28 (H) 8 - 23 mg/dL   Creatinine, Ser 1.70 (H) 0.44 - 1.00 mg/dL   Calcium 9.1 8.9 - 10.3 mg/dL   GFR, Estimated 32 (L) >60 mL/min    Comment: (NOTE) Calculated using the CKD-EPI Creatinine Equation (2021)    Anion gap 9 5 - 15    Comment: Performed at [facility] [address], [place], [place] [address]  Glucose, capillary     Status: Abnormal   Collection Time: 12/02/21  6:52 AM  Result Value Ref Range   Glucose-Capillary 156 (H) 70 - 99 mg/dL    Comment: Glucose reference range applies only to samples taken after fasting for at least 8 hours.   No results found.  Review of Systems  All other systems reviewed and are negative.   Blood pressure (!) 155/67, pulse 66, temperature 97.6 F (36.4 C), temperature source Oral, resp. rate 14, height _0  (1.702 m), weight 110.2 kg, SpO2 98 %. Physical Exam Vitals reviewed.  Constitutional:      Appearance: Normal appearance.  HENT:     Head: Normocephalic  and atraumatic.  Eyes:     Extraocular Movements: Extraocular movements intact.     Pupils: Pupils are equal, round, and reactive to light.  Cardiovascular:     Rate and Rhythm: Normal rate.     Pulses: Normal pulses.  Pulmonary:     Effort: Pulmonary effort is normal.  Abdominal:     Palpations: Abdomen is soft.  Musculoskeletal:     Right hand: Tenderness present.      Cervical back: Normal range of motion and neck supple.  Neurological:     Mental Status: She is alert and oriented to person, place, and time.  Psychiatric:        Behavior: Behavior normal.   There is pain over the right hand ring finger A1 pulley with active triggering.  Assessment/Plan Recurrent trigger finger right ring finger  The plan is to proceed to surgery today for a right ring finger A1 pulley release.  The risk and benefits of surgery been explained in detail and informed consent is obtained.  The right ring finger has been marked.  Mcarthur Rossetti, MD 12/02/2021, 7:11 AM

## 2021-12-02 NOTE — Discharge Instructions (Addendum)
You may use your right hand today as comfort allows. Keep your dressing clean and dry for the next 2 to 3 days. In 2 to 3 days you may remove your dressing and get your incision wet. Place a Band-Aid over the incision daily after the original dressing is removed.  No Tylenol before 1pm 12/02/21   Post Anesthesia Home Care Instructions  Activity: Get plenty of rest for the remainder of the day. A responsible individual must stay with you for 24 hours following the procedure.  For the next 24 hours, DO NOT: -Drive a car -Paediatric nurse -Drink alcoholic beverages -Take any medication unless instructed by your physician -Make any legal decisions or sign important papers.  Meals: Start with liquid foods such as gelatin or soup. Progress to regular foods as tolerated. Avoid greasy, spicy, heavy foods. If nausea and/or vomiting occur, drink only clear liquids until the nausea and/or vomiting subsides. Call your physician if vomiting continues.  Special Instructions/Symptoms: Your throat may feel dry or sore from the anesthesia or the breathing tube placed in your throat during surgery. If this causes discomfort, gargle with warm salt water. The discomfort should disappear within 24 hours.  If you had a scopolamine patch placed behind your ear for the management of post- operative nausea and/or vomiting:  1. The medication in the patch is effective for 72 hours, after which it should be removed.  Wrap patch in a tissue and discard in the trash. Wash hands thoroughly with soap and water. 2. You may remove the patch earlier than 72 hours if you experience unpleasant side effects which may include dry mouth, dizziness or visual disturbances. 3. Avoid touching the patch. Wash your hands with soap and water after contact with the patch.

## 2021-12-02 NOTE — Op Note (Signed)
[name], [name] MEDICAL RECORD NO: [id number] ACCOUNT NO: [id number] DATE OF BIRTH: 04-Jan-1951 FACILITY: [facility] LOCATION: [facility] PHYSICIAN: Lind Guest. Ninfa Linden, MD  Operative Report   DATE OF PROCEDURE: 12/02/2021   PREOPERATIVE DIAGNOSIS:  Right ring finger recurrent trigger finger.  POSTOPERATIVE DIAGNOSIS:  Right ring finger recurrent trigger finger.  PROCEDURE:  Right ring finger A1 pulley release.  SURGEON:  Lind Guest. Ninfa Linden MD  ASSISTANT:  Erskine Emery, PA-C  ANESTHESIA:   1.  Mask ventilation, IV sedation. 2.  Local with 0.25% plain Marcaine.  TOURNIQUET TIME:  Less than 10 minutes.  ESTIMATED BLOOD LOSS:  Minimal.  ANTIBIOTICS:  2 g IV Ancef.  COMPLICATIONS:  None.  INDICATIONS:  The patient is a 71 year old female with recurrent triggering of her right ring finger.  She had a similar situation on the left ring finger and failed conservative treatment on that side and required an A1 pulley release.  She has now  failed conservative treatment on the right side and wishes for an A1 pulley release of her right ring finger.  Having had this before, she is fully aware of the risks and benefits of surgery.  DESCRIPTION OF PROCEDURE:  After informed consent was obtained, appropriate right hand and ring finger were marked.  She was brought to the operating room and placed supine on the operating table, the right arm on arm table.  Nonsterile tourniquet was  placed around her upper right arm and right hand and wrist were prepped and draped with DuraPrep and sterile drapes.  A timeout was called and she was identified as correct patient, correct right hand and wrist for the right ring finger A1 pulley. Mask  ventilation, IV sedation was given, an Esmarch was used to wrap out the hand and tourniquet was inflated to 250 mm of pressure.  We then anesthetized over the A1 pulley with our plain Marcaine.  We then dissected over the A1 pulley with an incision and   dissected down to the pulley itself and released the pulley proximally and distally.  I was then able to flex and extend the finger and watched the flexor tendons glide without difficulty.  We then irrigated the wound and closed the incision with  interrupted nylon suture.  A well-padded sterile dressing was applied.  The tourniquet was let down.  Fingers pinked nicely.  She was taken to recovery room in stable condition with all final counts being correct.  There were no complications noted.     [email or web address] D: 12/02/2021 7:44:36 am T: 12/02/2021 8:25:00 am  JOB: [id number]/ [id number]

## 2021-12-02 NOTE — Anesthesia Postprocedure Evaluation (Signed)
Anesthesia Post Note  Patient: [name]  Procedure(s) Performed: RIGHT RING FINGER A-1 PULLEY RELEASE (Right: Hand)     Patient location during evaluation: PACU Anesthesia Type: MAC Level of consciousness: awake and alert Pain management: pain level controlled Vital Signs Assessment: post-procedure vital signs reviewed and stable Respiratory status: spontaneous breathing, nonlabored ventilation and respiratory function stable Cardiovascular status: stable and blood pressure returned to baseline Anesthetic complications: no   No notable events documented.  Last Vitals:  Vitals:   12/02/21 0800 12/02/21 0815  BP: (!) 166/78 (!) 172/78  Pulse: 67 68  Resp: 17 20  Temp:    SpO2: 97% 90%    Last Pain:  Vitals:   12/02/21 0815  TempSrc:   PainSc: 0-No pain                 Audry Pili

## 2021-12-02 NOTE — Transfer of Care (Signed)
Immediate Anesthesia Transfer of Care Note  Patient: [name]  Procedure(s) Performed: RIGHT RING FINGER A-1 PULLEY RELEASE (Right: Hand)  Patient Location: PACU  Anesthesia Type:MAC  Level of Consciousness: drowsy, patient cooperative and responds to stimulation  Airway & Oxygen Therapy: Patient Spontanous Breathing and Patient connected to face mask oxygen  Post-op Assessment: Report given to RN and Post -op Vital signs reviewed and stable  Post vital signs: Reviewed and stable  Last Vitals:  Vitals Value Taken Time  BP 172/78 12/02/21 0816  Temp 36.6 C 12/02/21 0753  Pulse 67 12/02/21 0818  Resp 20 12/02/21 0818  SpO2 92 % 12/02/21 0818  Vitals shown include unvalidated device data.  Last Pain:  Vitals:   12/02/21 0815  TempSrc:   PainSc: 0-No pain         Complications: No notable events documented.

## 2021-12-10 ENCOUNTER — Telehealth: Payer: Self-pay | Admitting: Orthopaedic Surgery

## 2021-12-10 ENCOUNTER — Other Ambulatory Visit: Payer: Self-pay | Admitting: Orthopaedic Surgery

## 2021-12-10 MED ORDER — HYDROCODONE-ACETAMINOPHEN 5-325 MG PO TABS: 1.0000 | ORAL_TABLET | Freq: Three times a day (TID) | ORAL | 0 refills | Status: DC | PRN

## 2021-12-10 NOTE — Telephone Encounter (Signed)
Please advise 

## 2021-12-10 NOTE — Telephone Encounter (Signed)
Patient called in wanting a Rx refill on Hydrocodone sent to [facility] [address]

## 2021-12-15 ENCOUNTER — Encounter: Payer: Self-pay | Admitting: Orthopaedic Surgery

## 2021-12-15 ENCOUNTER — Ambulatory Visit (INDEPENDENT_AMBULATORY_CARE_PROVIDER_SITE_OTHER): Payer: Medicare HMO | Admitting: Orthopaedic Surgery

## 2021-12-15 DIAGNOSIS — M65341 Trigger finger, right ring finger: Secondary | ICD-10-CM

## 2021-12-15 NOTE — Progress Notes (Signed)
HPI: [name] comes in today status post right ring finger A1 pulley release 12/02/2021.  She is overall doing well some stiffness soreness.  No fevers chills.  Physical exam: Right hand ring finger she is able to flex and extend the fingers but has some lack of range of motion of flexion of the fingers due to the soreness involving the ring finger.  There is no signs of infection.  No dehiscence.  Wound is healing well.  Stitches are retained.  Hand otherwise neurovascular intact.  Impression: Status post right ring finger trigger finger release 12/02/2021  Plan: Sutures removed.  Steri-Strips applied.  She will keep Steri-Strips on for the next 4 to 5 days.  She is to wash the hand for hygiene purposes no soaking.  No ointments.  She will see [place] back in 4 weeks see how she is doing overall sooner if there is any questions concerns.

## 2021-12-27 ENCOUNTER — Ambulatory Visit: Payer: Medicare HMO | Admitting: Pharmacist

## 2022-01-03 ENCOUNTER — Ambulatory Visit: Payer: Medicare HMO | Admitting: Podiatry

## 2022-01-10 ENCOUNTER — Ambulatory Visit: Payer: Medicare HMO | Admitting: Internal Medicine

## 2022-01-12 ENCOUNTER — Ambulatory Visit (HOSPITAL_COMMUNITY)
Admission: EM | Admit: 2022-01-12 | Discharge: 2022-01-12 | Disposition: A | Discharge status: Home / Self Care | Payer: Medicare HMO | Attending: Emergency Medicine | Admitting: Emergency Medicine

## 2022-01-12 ENCOUNTER — Encounter (HOSPITAL_COMMUNITY): Payer: Self-pay

## 2022-01-12 ENCOUNTER — Encounter: Payer: Medicare HMO | Admitting: Orthopaedic Surgery

## 2022-01-12 DIAGNOSIS — H60502 Unspecified acute noninfective otitis externa, left ear: Secondary | ICD-10-CM | POA: Diagnosis not present

## 2022-01-12 LAB — POCT RAPID STREP A, ED / UC: Streptococcus, Group A Screen (Direct): NEGATIVE

## 2022-01-12 MED ORDER — ACETAMINOPHEN 325 MG PO TABS
ORAL_TABLET | ORAL | Status: AC
  Filled 2022-01-12: qty 2

## 2022-01-12 MED ORDER — ACETAMINOPHEN 325 MG PO TABS: 325.0000 mg | ORAL_TABLET | Freq: Four times a day (QID) | ORAL | 2 refills | Status: DC | PRN

## 2022-01-12 MED ORDER — ACETAMINOPHEN 325 MG PO TABS
650.0000 mg | ORAL_TABLET | Freq: Once | ORAL | Status: AC
  Administered 2022-01-12: 650 mg via ORAL

## 2022-01-12 MED ORDER — OFLOXACIN 0.3 % OT SOLN: 5.0000 [drp] | Freq: Two times a day (BID) | OTIC | 0 refills | Status: AC

## 2022-01-12 NOTE — ED Triage Notes (Signed)
Patient having pain on the left side of the throat, left ear, and having a headache. Onset this morning. No known sick exposure and no one at home with similar symptoms.   Patient took some Theraflu around 12:30 today. Pain with swallowing.

## 2022-01-12 NOTE — Discharge Instructions (Addendum)
Your strep test was negative today. I believe your symptoms are related to the external ear infection. Use the antibiotic ear drops twice daily for the next 5-7 days.  Try tylenol every 4-6 hours for pain.  Please follow up with your primary care provider if symptoms persist. Please go to the emergency department if symptoms worsen.

## 2022-01-12 NOTE — ED Provider Notes (Signed)
[place]    CSN: [id number] Arrival date & time: 01/12/22  1526     History   Chief Complaint Chief Complaint  Patient presents with   Sore Throat   Headache   Otalgia   Cough    HPI [name] is a 71 y.o. female.  Presents with left sided throat, ear, and head pain that began this morning. Reports pain with swallowing. Somewhat productive cough, denies congestion. History of tonsillectomy.  No recent swimming, diving, flying. Does not use q-tips. No hearing changes.  Tried theraflu about 4 hours before presentation to urgent care.  No known sick contacts.  Past Medical History:  Diagnosis Date   Abscess in epidural space of L2-L5 lumbar spine 03/2006   Anemia, iron deficiency    Arthritis    Breast cancer ([place]) 06/12/2017   right breast   CAD (coronary artery disease), native coronary artery 03/30/2015   Cataract    Colon polyp    a. Multiple colonic polyps status post colonoscopy in October 2007, consistent with tubular adenoma, tubulovillous adenoma with no high-grade dysplasia or malignancy identified.    Constipation    Coronary artery disease    a. s/p CABG 06/2010: S-D2; S-PDA (at time of MV surgery).   Depression    Diabetes mellitus    type II   Family history of breast cancer    Family history of prostate cancer    Genetic testing 09/25/2017   Multi-Cancer panel (83 genes) @ [facility] - No pathogenic mutations detected   GERD (gastroesophageal reflux disease)    Hx of transient ischemic attack (TIA)    a. See stroke section.   Hyperlipidemia    Hypertension    Hypoxia    a. Has history of acute hypoxic respiratory failure in the setting of bronchitis/PNA or prior admissions.   MRSA infection    a. History of recurrent skin infection and soft tissue abscesses, with MRSA positive in the past.   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 mitral valve - s/p resection and MV repair 06/2010 Dr. [name].   Papillary fibroelastoma of heart  03/30/2015   Personal history of radiation therapy    Sleep apnea    does not use CPAP   Stenosis of middle cerebral artery    a. Distal R MCA.   Stroke [facility])    a. 11/2009: mitral mass diagnosed at this time, also has distal R MCA stenosis, tx with coumadin. b. Readmitted 05/2010 with TIA symptoms - had not been taking Coumadin. s/p MV surgery 06/2010. Coumadin stopped 2013 after review of chart by Dr. [name] since mass was removed (stroke felt possibly related to this).     Patient Active Problem List   Diagnosis Date Noted   Trigger finger, right ring finger 12/02/2021   DM type 2 with diabetic peripheral neuropathy ([place]) 07/26/2021   Bilateral low back pain with bilateral sciatica 07/26/2021   Gait abnormality 07/26/2021   Controlled substance agreement signed 12/31/2020   Shoulder stiffness, left 07/07/2020   Status post lumbar spine surgery for decompression of spinal cord 06/09/2020   Lumbar stenosis 05/27/2020   Spinal stenosis of lumbar region 05/21/2019   Complete tear of left rotator cuff 05/16/2019   Uncontrolled type 2 diabetes mellitus with hyperglycemia ([place]) 12/19/2018   Microalbuminuria 04/08/2018   Genetic testing 09/25/2017   Family history of breast cancer    Family history of prostate cancer    Malignant neoplasm of upper-outer quadrant of right  breast in female, estrogen receptor positive ([place]) 06/26/2017   Lumbar herniated disc 04/15/2015   Abnormal nuclear stress test 04/03/2015   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   CAD (coronary artery disease), native coronary artery 03/30/2015   S/P mitral valve repair 11/16/2010   Dyslipidemia 06/29/2010   Diabetes ([place]) 09/29/2006   ANEMIA-IRON DEFICIENCY 09/29/2006   Essential hypertension 09/29/2006    Past Surgical History:  Procedure Laterality Date   BREAST BIOPSY Right 08/26/2019   benign   BREAST LUMPECTOMY Right    BREAST LUMPECTOMY WITH RADIOACTIVE SEED AND SENTINEL LYMPH NODE BIOPSY Right  07/20/2017   Procedure: RIGHT BREAST LUMPECTOMY WITH RADIOACTIVE SEED AND SENTINEL LYMPH NODE BIOPSY;  Surgeon: Alphonsa Overall, MD;  Location: [place];  Service: General;  Laterality: Right;   CARDIAC CATHETERIZATION  04/03/2015   Procedure: Left Heart Cath and Cors/Grafts Angiography;  Surgeon: Peter M Martinique, MD;  Location: [place] [facility];  Service: Cardiovascular;;   CORONARY ARTERY BYPASS GRAFT  1/12   history of ankle fractures requiring surgery     LUMBAR LAMINECTOMY/DECOMPRESSION MICRODISCECTOMY Left 04/15/2015   Procedure: LUMBAR LAMINECTOMY/DECOMPRESSION MICRODISCECTOMY 1 LEVEL;  Surgeon: Newman Pies, MD;  Location: [place] [facility];  Service: Neurosurgery;  Laterality: Left;  Left L23 microdiskectomy   LUMBAR LAMINECTOMY/DECOMPRESSION MICRODISCECTOMY N/A 05/27/2020   Procedure: L2-3, L3-4  decompression;  Surgeon: [name], MD;  Location: [place];  Service: Orthopedics;  Laterality: N/A;   MV repair and resection of mass  1/12   RESECTION DISTAL CLAVICAL Left 05/16/2019   Procedure: RESECTION DISTAL CLAVICAL;  Surgeon: Mcarthur Rossetti, MD;  Location: [place];  Service: Orthopedics;  Laterality: Left;   SHOULDER ARTHROSCOPY WITH ROTATOR CUFF REPAIR AND SUBACROMIAL DECOMPRESSION Left 05/16/2019   Procedure: SHOULDER ARTHROSCOPY WITH ROTATOR CUFF REPAIR AND SUBACROMIAL DECOMPRESSION;  Surgeon: Mcarthur Rossetti, MD;  Location: [place];  Service: Orthopedics;  Laterality: Left;   TONSILLECTOMY  1971   TOTAL ABDOMINAL HYSTERECTOMY  1978   TRIGGER FINGER RELEASE Right 12/02/2021   Procedure: RIGHT RING FINGER A-1 PULLEY RELEASE;  Surgeon: Mcarthur Rossetti, MD;  Location: [place];  Service: Orthopedics;  Laterality: Right;   VESICOVAGINAL FISTULA CLOSURE W/ TAH      OB History   No obstetric history on file.      Home Medications    Prior to Admission medications   Medication Sig Start Date End Date Taking?  Authorizing Provider  acetaminophen (TYLENOL) 325 MG tablet Take 1 tablet (325 mg total) by mouth every 6 (six) hours as needed. 01/12/22  Yes Rising, Wells Guiles, PA-C  amLODipine (NORVASC) 10 MG tablet TAKE 1 TABLET EVERY DAY 12/02/21  Yes [name], MD  Ascorbic Acid (VITAMIN C PO) Take 500 mg by mouth daily.   Yes [provider]  aspirin EC 81 MG tablet Take 1 tablet (81 mg total) by mouth daily. 06/26/14  Yes Lance Bosch, NP  atorvastatin (LIPITOR) 80 MG tablet TAKE 1 TABLET EVERY DAY (DOSE INCREASE) 12/02/21  Yes [name], MD  buPROPion (WELLBUTRIN XL) 150 MG 24 hr tablet Take 1 tablet (150 mg total) by mouth 2 (two) times daily. 07/19/21  Yes [name], MD  carvedilol (COREG) 25 MG tablet Take 1 tablet (25 mg total) by mouth 2 (two) times daily. 11/23/21  Yes Turner, Eber Hong, MD  cholecalciferol (VITAMIN D3) 25 MCG (1000 UNIT) tablet Take 1,000 Units by mouth daily.   Yes [provider]  DULoxetine (CYMBALTA) 30 MG capsule Take 1 capsule (30 mg total) by mouth daily. 10/05/21  Yes Marcial Pacas, MD  ferrous sulfate (FEROSUL) 325 (65 FE) MG tablet TAKE 1 TABLET (325 MG TOTAL) BY MOUTH DAILY WITH BREAKFAST. Patient taking differently: Take 325 mg by mouth daily. TAKE 1 TABLET (325 MG TOTAL) BY MOUTH DAILY WITH BREAKFAST. 05/15/20  Yes [name], MD  gabapentin (NEURONTIN) 100 MG capsule Take 1 capsule (100 mg total) by mouth 3 (three) times daily. 07/26/21  Yes Marcial Pacas, MD  hydrALAZINE (APRESOLINE) 100 MG tablet Take 1 tablet (100 mg total) by mouth 2 (two) times daily. 11/23/21  Yes [name], MD  hydrochlorothiazide (HYDRODIURIL) 25 MG tablet TAKE 1 TABLET EVERY DAY 11/16/21  Yes [name], MD  insulin aspart (NOVOLOG FLEXPEN) 100 UNIT/ML FlexPen Please give Relion [employer]. Inj 4 units subcut with the 2 larges meals of the day 11/01/21  Yes [name], MD  insulin glargine (LANTUS) 100 UNIT/ML Solostar Pen Inject 10 Units into  the skin at bedtime. 10/28/21  Yes [name], MD  letrozole [facility]) 2.5 MG tablet TAKE 1 TABLET EVERY DAY (APPOINTMENT IS NEEDED FOR REFILLS) 11/23/21  Yes Causey, Charlestine Massed, NP  losartan (COZAAR) 50 MG tablet Take 1 tablet (50 mg total) by mouth daily. 09/28/21  Yes [name], MD  metFORMIN (GLUCOPHAGE) 500 MG tablet Take 1 tablet (500 mg total) by mouth daily with breakfast. 09/15/21  Yes [name], MD  methocarbamol (ROBAXIN) 500 MG tablet TAKE 1 TABLET EVERY 8 HOURS AS NEEDED FOR MUSCLE SPASMS. 10/07/21  Yes [name], MD  ofloxacin (FLOXIN) 0.3 % OTIC solution Place 5 drops into the left ear 2 (two) times daily for 5 days. 01/12/22 01/17/22 Yes Rising, Wells Guiles, PA-C  pantoprazole (PROTONIX) 40 MG tablet TAKE 1 TABLET EVERY DAY 10/07/21  Yes [name], MD  traZODone (DESYREL) 50 MG tablet TAKE 1 TABLET AT BEDTIME AS NEEDED FOR SLEEP 08/27/21  Yes [name], MD  Accu-Chek Softclix Lancets lancets Use as instructed 07/30/20   [name], MD  albuterol (VENTOLIN HFA) 108 (90 Base) MCG/ACT inhaler Inhale 2 puffs into the lungs every 6 (six) hours as needed for wheezing or shortness of breath. 07/29/20   [name], MD  Blood Glucose Monitoring Suppl (ACCU-CHEK AVIVA PLUS) w/Device KIT USE AS DIRECTED 05/31/21   [name], MD  Continuous Blood Gluc Receiver (DEXCOM G6 RECEIVER) DEVI 1 Device by Does not apply route daily. 09/06/21   [name], MD  Continuous Blood Gluc Sensor (DEXCOM G6 SENSOR) MISC 1 packet by Does not apply route daily. 09/06/21   [name], MD  Continuous Blood Gluc Sensor (FREESTYLE LIBRE SENSOR SYSTEM) MISC Change sensor Q 2 wks 09/15/21   [name], MD  diclofenac Sodium (VOLTAREN) 1 % GEL APPLY 2 GRAMS TOPICALLY 4 (FOUR) TIMES DAILY AS NEEDED. 01/20/20   [name], MD  glucose blood (ACCU-CHEK AVIVA PLUS) test strip Use as instructed 07/30/20   [name], MD   HYDROcodone-acetaminophen (NORCO/VICODIN) 5-325 MG tablet Take 1 tablet by mouth 3 (three) times daily as needed for moderate pain. 12/10/21   Mcarthur Rossetti, MD  Insulin Pen Needle (PEN NEEDLES) 31G X 8 MM MISC UAD 04/07/21   [name], MD    Family History Family History  Problem Relation Age of Onset   Colon cancer Mother  dx 40s; deceased 77   Irritable bowel syndrome Mother    Prostate cancer Father        deceased 21s   Cirrhosis Sister        died of GI bleed associated with cirrhosis of the liver   Stroke Brother    Prostate cancer Brother 16       deceased 82   Diabetes Brother        x3   Kidney disease Brother    Cancer Brother 102       unk. type   Breast cancer Cousin        several maternal 1st and 2nd cousins with breast cancer    Social History Social History   Tobacco Use   Smoking status: Former    Types: Cigarettes    Quit date: 10/14/2010    Years since quitting: 11.2   Smokeless tobacco: Never  Vaping Use   Vaping Use: Never used  Substance Use Topics   Alcohol use: Yes    Comment: social   Drug use: No     Allergies   Patient has no known allergies.   Review of Systems Review of Systems  HENT:  Positive for ear pain.   Respiratory:  Positive for cough.   Neurological:  Positive for headaches.   Per HPI  Physical Exam Triage Vital Signs ED Triage Vitals  Enc Vitals Group     BP 01/12/22 1604 (!) 162/75     Pulse Rate 01/12/22 1604 73     Resp 01/12/22 1604 16     Temp 01/12/22 1604 99.2 F (37.3 C)     Temp Source 01/12/22 1604 Oral     SpO2 01/12/22 1604 96 %     Weight 01/12/22 1606 245 lb (111.1 kg)     Height 01/12/22 1606 5' 8" (1.727 m)     Head Circumference --      Peak Flow --      Pain Score 01/12/22 1606 9     Pain Loc --      Pain Edu? --      Excl. in [place]? --    No data found.  Updated Vital Signs BP (!) 162/75 (BP Location: Left Arm)   Pulse 73   Temp 99.2 F (37.3 C) (Oral)    Resp 16   Ht 5' 8" (1.727 m)   Wt 245 lb (111.1 kg)   SpO2 96%   BMI 37.25 kg/m    Physical Exam Vitals and nursing note reviewed.  Constitutional:      General: She is not in acute distress. HENT:     Right Ear: External ear normal. No tenderness. There is impacted cerumen. No mastoid tenderness.     Left Ear: External ear normal. Swelling present. No tenderness. No mastoid tenderness.     Ears:     Comments: Swelling of left canal consistent with otitis externa    Mouth/Throat:     Mouth: Mucous membranes are moist.     Pharynx: Uvula midline. No posterior oropharyngeal erythema.     Tonsils: No tonsillar exudate or tonsillar abscesses. 0 on the right. 0 on the left.  Eyes:     Conjunctiva/sclera: Conjunctivae normal.     Pupils: Pupils are equal, round, and reactive to light.  Cardiovascular:     Rate and Rhythm: Normal rate and regular rhythm.     Heart sounds: Normal heart sounds.  Pulmonary:     Effort: Pulmonary effort is  normal. No respiratory distress.     Breath sounds: Normal breath sounds. No wheezing.  Abdominal:     General: Bowel sounds are normal.     Palpations: Abdomen is soft.     Tenderness: There is no abdominal tenderness.  Musculoskeletal:     Cervical back: Normal range of motion.  Lymphadenopathy:     Cervical: No cervical adenopathy.  Neurological:     Mental Status: She is alert and oriented to person, place, and time.     UC Treatments / Results  Labs (all labs ordered are listed, but only abnormal results are displayed) Labs Reviewed  POCT RAPID STREP A, ED / UC    EKG  Radiology No results found.  Procedures Procedures  Medications Ordered in UC Medications  acetaminophen (TYLENOL) tablet 650 mg (has no administration in time range)  Given 4:52 PM  Initial Impression / Assessment and Plan / UC Course  I have reviewed the triage vital signs and the nursing notes.  Pertinent labs & imaging results that were available during  my care of the patient were reviewed by me and considered in my medical decision making (see chart for details).  Tylenol dose given in clinic. Strep negative. Left side head and neck pain could be due to otitis externa of left ear. Could also be virus causing throat symptoms. Recommend symptomatic care at home with ofloxacin ear drops. PCP follow up, ED precautions. Patient agrees to plan.  Final Clinical Impressions(s) / UC Diagnoses   Final diagnoses:  Acute otitis externa of left ear, unspecified type     Discharge Instructions      Your strep test was negative today. I believe your symptoms are related to the external ear infection. Use the antibiotic ear drops twice daily for the next 5-7 days.  Try tylenol every 4-6 hours for pain.  Please follow up with your primary care provider if symptoms persist. Please go to the emergency department if symptoms worsen.     ED Prescriptions     Medication Sig Dispense Auth. Provider   ofloxacin (FLOXIN) 0.3 % OTIC solution Place 5 drops into the left ear 2 (two) times daily for 5 days. 2.5 mL Rayansh Herbst, PA-C   acetaminophen (TYLENOL) 325 MG tablet Take 1 tablet (325 mg total) by mouth every 6 (six) hours as needed. 30 tablet Talvin Christianson, Wells Guiles, PA-C      PDMP not reviewed this encounter.   Jaymarion Trombly, Vernice Jefferson 01/12/22 1655

## 2022-01-13 ENCOUNTER — Ambulatory Visit: Payer: Self-pay | Admitting: *Deleted

## 2022-01-13 NOTE — Telephone Encounter (Signed)
  Chief Complaint: requesting medication oral medication instead of ear drops due to financial issues  Symptoms: sore throat with severe pain swallowing. Ear pain . Went to UC yesterday and prescribed ear drops. Can not afford $45 ear drops. Requesting oral medication / antibiotic Frequency: yesterday  Pertinent Negatives: Patient denies chest pain no difficulty breathing no fever Disposition: '[]'$ ED /'[]'$ Urgent Care (no appt availability in office) / '[]'$ Appointment(In office/virtual)/ '[]'$  [facility]/ '[]'$ Home Care/ '[]'$ Refused Recommended Disposition /'[x]'$ [facility]/ '[x]'$  Follow-up with PCP Additional Notes:   No available appt last seen at [facility] yesterday . Please advise regarding medication request . Please send medication to [place] [facility] in [place] if possible.       Reason for Disposition  SEVERE (e.g., excruciating) throat pain  Answer Assessment - Initial Assessment Questions 1. ONSET: "When did the throat start hurting?" (Hours or days ago)      Went to [facility] yesterday for treatment  2. SEVERITY: "How bad is the sore throat?" (Scale 1-10; mild, moderate or severe)   - MILD (1-3):  Doesn't interfere with eating or normal activities.   - MODERATE (4-7): Interferes with eating some solids and normal activities.   - SEVERE (8-10):  Excruciating pain, interferes with most normal activities.   - SEVERE WITH DYSPHAGIA (10): Can't swallow liquids, drooling.     Severe, painful swallowing but can swallow  3. STREP EXPOSURE: "Has there been any exposure to strep within the past week?" If Yes, ask: "What type of contact occurred?"      na 4.  VIRAL SYMPTOMS: "Are there any symptoms of a cold, such as a runny nose, cough, hoarse voice or red eyes?"      Ear pain  5. FEVER: "Do you have a fever?" If Yes, ask: "What is your temperature, how was it measured, and when did it start?"     na 6. PUS ON THE TONSILS: "Is there pus on the tonsils in the back of your  throat?"     no 7. OTHER SYMPTOMS: "Do you have any other symptoms?" (e.g., difficulty breathing, headache, rash)     Sore throat ear pain  8. PREGNANCY: "Is there any chance you are pregnant?" "When was your last menstrual period?"     na  Protocols used: Sore Throat-A-AH

## 2022-01-14 MED ORDER — AMOXICILLIN-POT CLAVULANATE 500-125 MG PO TABS: 1.0000 | ORAL_TABLET | Freq: Three times a day (TID) | ORAL | 0 refills | Status: DC

## 2022-01-14 NOTE — Telephone Encounter (Signed)
Is need different medication due to cost

## 2022-01-20 NOTE — Telephone Encounter (Signed)
Called pt and she is doing much better, she called the pharmacy and they said they only had the ear drops ready   Called pharmacy and they received medication  got it ready now and pt is aware

## 2022-01-20 NOTE — Progress Notes (Deleted)
[facility] [address] [place] [address] [phone]       OFFICE FOLLOW UP NOTE  Ms. [name] Date of Birth:  Aug 09, 1950 Medical Record Number:  [id number]   Reason for visit: Gait abnormality, RLS, chronic pain    SUBJECTIVE:   CHIEF COMPLAINT:  No chief complaint on file.   HPI:   Update 01/24/2022 JM: Patient returns for 47-month follow-up.  At prior visit, she was started on gabapentin 100 mg 3 times daily and Cymbalta 30 mg daily for RLS Depression, anxiety and chronic pain.    Completed MRI cervical which did not show evidence of cervical spondylitic myelopathy.         Consult visit 07/26/2021 Dr. Krista Blue: [name] is a 71 year old female, seen in request by [name], MD   I reviewed and summarized the referring note. PMHX. HLD HTN DM Lumbar decompression surgery, chronic low back pain,  Right breast cancer, s/p lobectomy, s/p radiation therapy CAD s/p CABG MV repair surgery Stroke Depression   Triple bypass,  Tramadol  Gabapentin  was helpful, bupropiorn was helpful.   She got it from [employer], trusting to God,  some people was weak. She was in bad drepssion in 2022.   Abnoralm movement, right 5th finger numbnerss, left side is no, she has sharp pain at right foot, big toe, low back pia, goes down her leg, when it hit, it was so severe, mostly on left.   She has [place] Duff urgerncy,      Some times left side headache to her neck, x 5 years, twice a week, lasting for 1 mintues,    Laterly, when she got up, she feels funny, feels like vertigo, unblancne sensation,    Low back surgery,           ROS:   14 system review of systems performed and negative with exception of ***  PMH:  Past Medical History:  Diagnosis Date   Abscess in epidural space of L2-L5 lumbar spine 03/2006   Anemia, iron deficiency    Arthritis    Breast cancer ([place]) 06/12/2017   right  breast   CAD (coronary artery disease), native coronary artery 03/30/2015   Cataract    Colon polyp    a. Multiple colonic polyps status post colonoscopy in October 2007, consistent with tubular adenoma, tubulovillous adenoma with no high-grade dysplasia or malignancy identified.    Constipation    Coronary artery disease    a. s/p CABG 06/2010: S-D2; S-PDA (at time of MV surgery).   Depression    Diabetes mellitus    type II   Family history of breast cancer    Family history of prostate cancer    Genetic testing 09/25/2017   Multi-Cancer panel (83 genes) @ [facility] - No pathogenic mutations detected   GERD (gastroesophageal reflux disease)    Hx of transient ischemic attack (TIA)    a. See stroke section.   Hyperlipidemia    Hypertension    Hypoxia    a. Has history of acute hypoxic respiratory failure in the setting of bronchitis/PNA or prior admissions.   MRSA infection    a. History of recurrent skin infection and soft tissue abscesses, with MRSA positive in the past.   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 mitral valve - s/p resection and MV repair 06/2010 Dr. [name].   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 Distal R MCA.   Stroke [facility])    a. 11/2009: mitral mass diagnosed at this time, also has distal R MCA stenosis, tx with coumadin. b. Readmitted 05/2010 with TIA symptoms - had not been taking Coumadin. s/p MV surgery 06/2010. Coumadin stopped 2013 after review of chart by Dr. [name] since mass was removed (stroke felt possibly related to this).     PSH:  Past Surgical History:  Procedure Laterality Date   BREAST BIOPSY Right 08/26/2019   benign   BREAST LUMPECTOMY Right    BREAST LUMPECTOMY WITH RADIOACTIVE SEED AND SENTINEL LYMPH NODE BIOPSY Right 07/20/2017   Procedure: RIGHT BREAST LUMPECTOMY WITH RADIOACTIVE SEED AND SENTINEL LYMPH NODE  BIOPSY;  Surgeon: Alphonsa Overall, MD;  Location: [place];  Service: General;  Laterality: Right;   CARDIAC CATHETERIZATION  04/03/2015   Procedure: Left Heart Cath and Cors/Grafts Angiography;  Surgeon: Peter M Martinique, MD;  Location: [place] CV LAB;  Service: Cardiovascular;;   CORONARY ARTERY BYPASS GRAFT  1/12   history of ankle fractures requiring surgery     LUMBAR LAMINECTOMY/DECOMPRESSION MICRODISCECTOMY Left 04/15/2015   Procedure: LUMBAR LAMINECTOMY/DECOMPRESSION MICRODISCECTOMY 1 LEVEL;  Surgeon: Newman Pies, MD;  Location: [place] NEURO ORS;  Service: Neurosurgery;  Laterality: Left;  Left L23 microdiskectomy   LUMBAR LAMINECTOMY/DECOMPRESSION MICRODISCECTOMY N/A 05/27/2020   Procedure: L2-3, L3-4  decompression;  Surgeon: [name], MD;  Location: [place];  Service: Orthopedics;  Laterality: N/A;   MV repair and resection of mass  1/12   RESECTION DISTAL CLAVICAL Left 05/16/2019   Procedure: RESECTION DISTAL CLAVICAL;  Surgeon: Mcarthur Rossetti, MD;  Location: [place];  Service: Orthopedics;  Laterality: Left;   SHOULDER ARTHROSCOPY WITH ROTATOR CUFF REPAIR AND SUBACROMIAL DECOMPRESSION Left 05/16/2019   Procedure: SHOULDER ARTHROSCOPY WITH ROTATOR CUFF REPAIR AND SUBACROMIAL DECOMPRESSION;  Surgeon: Mcarthur Rossetti, MD;  Location: [place];  Service: Orthopedics;  Laterality: Left;   TONSILLECTOMY  1971   TOTAL ABDOMINAL HYSTERECTOMY  1978   TRIGGER FINGER RELEASE Right 12/02/2021   Procedure: RIGHT RING FINGER A-1 PULLEY RELEASE;  Surgeon: Mcarthur Rossetti, MD;  Location: [place];  Service: Orthopedics;  Laterality: Right;   VESICOVAGINAL FISTULA CLOSURE W/ TAH      Social History:  Social History   Socioeconomic History   Marital status: Married    Spouse name: Not on file   Number of children: 3   Years of education: some college   Highest education level: Not on file  Occupational History     Employer: UNEMPLOYED  Tobacco Use   Smoking status: Former    Types: Cigarettes    Quit date: 10/14/2010    Years since quitting: 11.2   Smokeless tobacco: Never  Vaping Use   Vaping Use: Never used  Substance and Sexual Activity   Alcohol use: Yes    Comment: social   Drug use: No   Sexual activity: Not Currently  Other Topics Concern   Not on file  Social History Narrative   Lives with husband, stay at home, uses cane occasionally, still active/ambulatory.    Left-handed.   No daily caffeine use.   Social Determinants of Health   Financial Resource Strain: Not on file  Food Insecurity: No Food Insecurity (05/24/2021)   Hunger Vital Sign    Worried About Running Out of Food in the  Last Year: Never true    Ran Out of Food in the Last Year: Never true  Transportation Needs: Not on file  Physical Activity: Not on file  Stress: Not on file  Social Connections: Not on file  Intimate Partner Violence: Not on file    Family History:  Family History  Problem Relation Age of Onset   Colon cancer Mother        dx 82s; deceased 65   Irritable bowel syndrome Mother    Prostate cancer Father        deceased 51s   Cirrhosis Sister        died of GI bleed associated with cirrhosis of the liver   Stroke Brother    Prostate cancer Brother 29       deceased 77   Diabetes Brother        x3   Kidney disease Brother    Cancer Brother 60       unk. type   Breast cancer Cousin        several maternal 1st and 2nd cousins with breast cancer    Medications:   Current Outpatient Medications on File Prior to Visit  Medication Sig Dispense Refill   Accu-Chek Softclix Lancets lancets Use as instructed 100 each 12   acetaminophen (TYLENOL) 325 MG tablet Take 1 tablet (325 mg total) by mouth every 6 (six) hours as needed. 30 tablet 2   albuterol (VENTOLIN HFA) 108 (90 Base) MCG/ACT inhaler Inhale 2 puffs into the lungs every 6 (six) hours as needed for wheezing or shortness of breath. 1  each 2   amLODipine (NORVASC) 10 MG tablet TAKE 1 TABLET EVERY DAY 90 tablet 0   amoxicillin-clavulanate (AUGMENTIN) 500-125 MG tablet Take 1 tablet (500 mg total) by mouth 3 (three) times daily. 14 tablet 0   Ascorbic Acid (VITAMIN C PO) Take 500 mg by mouth daily.     aspirin EC 81 MG tablet Take 1 tablet (81 mg total) by mouth daily. 30 tablet 5   atorvastatin (LIPITOR) 80 MG tablet TAKE 1 TABLET EVERY DAY (DOSE INCREASE) 90 tablet 0   Blood Glucose Monitoring Suppl (ACCU-CHEK AVIVA PLUS) w/Device KIT USE AS DIRECTED 1 kit 0   buPROPion (WELLBUTRIN XL) 150 MG 24 hr tablet Take 1 tablet (150 mg total) by mouth 2 (two) times daily. 28 tablet 0   carvedilol (COREG) 25 MG tablet Take 1 tablet (25 mg total) by mouth 2 (two) times daily. 180 tablet 3   cholecalciferol (VITAMIN D3) 25 MCG (1000 UNIT) tablet Take 1,000 Units by mouth daily.     Continuous Blood Gluc Receiver (DEXCOM G6 RECEIVER) DEVI 1 Device by Does not apply route daily. 1 each 0   Continuous Blood Gluc Sensor (DEXCOM G6 SENSOR) MISC 1 packet by Does not apply route daily. 3 each 11   Continuous Blood Gluc Sensor (FREESTYLE LIBRE SENSOR SYSTEM) MISC Change sensor Q 2 wks 2 each 12   diclofenac Sodium (VOLTAREN) 1 % GEL APPLY 2 GRAMS TOPICALLY 4 (FOUR) TIMES DAILY AS NEEDED. 100 g 0   DULoxetine (CYMBALTA) 30 MG capsule Take 1 capsule (30 mg total) by mouth daily. 90 capsule 3   ferrous sulfate (FEROSUL) 325 (65 FE) MG tablet TAKE 1 TABLET (325 MG TOTAL) BY MOUTH DAILY WITH BREAKFAST. (Patient taking differently: Take 325 mg by mouth daily. TAKE 1 TABLET (325 MG TOTAL) BY MOUTH DAILY WITH BREAKFAST.) 90 tablet 0   gabapentin (NEURONTIN) 100 MG capsule Take  1 capsule (100 mg total) by mouth 3 (three) times daily. 90 capsule 11   glucose blood (ACCU-CHEK AVIVA PLUS) test strip Use as instructed 100 each 12   hydrALAZINE (APRESOLINE) 100 MG tablet Take 1 tablet (100 mg total) by mouth 2 (two) times daily. 180 tablet 1    hydrochlorothiazide (HYDRODIURIL) 25 MG tablet TAKE 1 TABLET EVERY DAY 90 tablet 0   HYDROcodone-acetaminophen (NORCO/VICODIN) 5-325 MG tablet Take 1 tablet by mouth 3 (three) times daily as needed for moderate pain. 20 tablet 0   insulin aspart (NOVOLOG FLEXPEN) 100 UNIT/ML FlexPen Please give [employer]ion [employer]. Inj 4 units subcut with the 2 larges meals of the day 15 mL 5   insulin glargine (LANTUS) 100 UNIT/ML Solostar Pen Inject 10 Units into the skin at bedtime. 15 mL 0   Insulin Pen Needle (PEN NEEDLES) 31G X 8 MM MISC UAD 100 each 6   letrozole (FEMARA) 2.5 MG tablet TAKE 1 TABLET EVERY DAY (APPOINTMENT IS NEEDED FOR REFILLS) 90 tablet 3   losartan (COZAAR) 50 MG tablet Take 1 tablet (50 mg total) by mouth daily. 90 tablet 1   metFORMIN (GLUCOPHAGE) 500 MG tablet Take 1 tablet (500 mg total) by mouth daily with breakfast. 90 tablet 2   methocarbamol (ROBAXIN) 500 MG tablet TAKE 1 TABLET EVERY 8 HOURS AS NEEDED FOR MUSCLE SPASMS. 60 tablet 1   pantoprazole (PROTONIX) 40 MG tablet TAKE 1 TABLET EVERY DAY 90 tablet 1   traZODone (DESYREL) 50 MG tablet TAKE 1 TABLET AT BEDTIME AS NEEDED FOR SLEEP 90 tablet 0   No current facility-administered medications on file prior to visit.    Allergies:  No Known Allergies    OBJECTIVE:  Physical Exam  There were no vitals filed for this visit. There is no height or weight on file to calculate BMI. No results found.   General: well developed, well nourished, seated, in no evident distress Head: head normocephalic and atraumatic.   Neck: supple with no carotid or supraclavicular bruits Cardiovascular: regular rate and rhythm, no murmurs Musculoskeletal: no deformity Skin:  no rash/petichiae Vascular:  Normal pulses all extremities   Neurologic Exam Mental Status: Awake and fully alert. Oriented to place and time. Recent and remote memory intact. Attention span, concentration and fund of knowledge appropriate. Mood and affect appropriate.   Cranial Nerves: Pupils equal, briskly reactive to light. Extraocular movements full without nystagmus. Visual fields full to confrontation. Hearing intact. Facial sensation intact. Face, tongue, palate moves normally and symmetrically.  Motor: Normal bulk and tone. Normal strength in all tested extremity muscles Sensory.: intact to touch , pinprick , position and vibratory sensation.  Coordination: Rapid alternating movements normal in all extremities. Finger-to-nose and heel-to-shin performed accurately bilaterally. Gait and Station: Arises from chair without difficulty. Stance is normal. Gait demonstrates normal stride length and balance without use of AD. Tandem walk and heel toe without difficulty.  Reflexes: 1+ and symmetric. Toes downgoing.         ASSESSMENT/PLAN: [name] is a 71 y.o. year old female ***.   Gait abnormality Restless leg symptoms Chronic pain             Patient has multiple lumbar decompression surgery in the past, no continue chronic low back pain, radiating pain from neck to bilateral shoulder, less dependent sensory changes, brisk lower extremity reflex, bilateral Babinski signs,             Her gait abnormality are combination of obesity, diabetic peripheral  neuropathy, poorly controlled diabetes A1c was 10, and low back pain.  Cervical spondylitic myelopathy ruled out   MRI cervical 07/2021 C5-6 and C 6-7 mild spinal stenosis and severe bilateral foraminal stenosis without cord signal changes             Restless leg symptoms, related to recent flareup of depression, anxiety, chronic pain, will try gabapentin 100 mg 3 times daily, also add on Cymbalta 30 mg daily             Encouraged her moderate exercise      Follow up in *** or call earlier if needed   CC:  PCP: [name], MD    I spent *** minutes of face-to-face and non-face-to-face time with patient.  This included previsit chart review, lab review, study review, order entry,  electronic health record documentation, patient education regarding ***   Frann Rider, AGNP-BC  [facility] [address] [place], [place] [address]  Phone [phone] Fax [phone] Note: This document was prepared with digital dictation and possible smart phrase technology. Any transcriptional errors that result from this process are unintentional.

## 2022-01-24 ENCOUNTER — Encounter: Payer: Self-pay | Admitting: Adult Health

## 2022-01-24 ENCOUNTER — Ambulatory Visit: Payer: Medicare HMO | Attending: Internal Medicine | Admitting: Pharmacist

## 2022-01-24 ENCOUNTER — Ambulatory Visit: Payer: Medicare HMO | Admitting: Adult Health

## 2022-01-24 VITALS — BP 132/62 | HR 78 | Wt 238.4 lb

## 2022-01-24 DIAGNOSIS — I10 Essential (primary) hypertension: Secondary | ICD-10-CM | POA: Diagnosis not present

## 2022-01-24 DIAGNOSIS — E875 Hyperkalemia: Secondary | ICD-10-CM | POA: Diagnosis not present

## 2022-01-24 MED ORDER — VALSARTAN-HYDROCHLOROTHIAZIDE 160-25 MG PO TABS: 1.0000 | ORAL_TABLET | Freq: Every day | ORAL | 0 refills | Status: DC

## 2022-01-24 NOTE — Progress Notes (Signed)
S:     No chief complaint on file.   [name] is a 71 y.o. female who presents for hypertension evaluation, education, and management. PMH is significant for  HTN, DM, HL, depression, chronic back pain, RT breast CA (intraductal CA, ER/PR+, lumpectomy and XRT - completed 08/2017), CKD stage 3, CVA 2011 (residual weakness on LT side), CAD s/p CABG and MVR/resection of fibroelastoma, IDA. [name] Patient was referred and last seen by Primary Care Provider, Dr. Wynetta Emery, on 09/06/2021.   Today, patient arrives in good spirits and presents without assistance. Denies dizziness, blurred vision, swelling. Patient reports she has been having increased stress in her life related to living with her daughter, and she has not been checking her blood pressure at home. It is still unclear if patient is taking 25 vs. 50 mg dose of losartan. She states she continues to take only 1/2 tablet a day. Dispense records show she receives losartan 50 mg tablets (meaning 1/2 tablet would be 25 mg), but patient insists that she takes losartan 50 mg daily.  Patient reports hypertension is longstanding.   Family/Social history:  Fhx: stroke, DM, kidney disease  Tobacco: former smoker (quit in 2012) Alcohol: none reported   Medication adherence reported. However, only took hydralazine prior to visit.  Current antihypertensives include: amlodipine 10 mg daily, carvedilol 25 mg BID, hydralazine 100 mg BID, HCTZ 25 mg daily, losartan 50 mg daily (unclear if taking 25 mg vs. 50 mg)  Reported home BP readings: not checking blood pressure at home due to being stressed and sick last week.  O:  Vitals:   01/24/22 0927 01/24/22 0933  BP: (!) 145/70 132/62  Pulse: 82 78   Last 3 Office BP readings: BP Readings from Last 3 Encounters:  01/24/22 132/62  01/12/22 (!) 162/75  12/02/21 (!) 172/78   BMET    Component Value Date/Time   NA 139 11/30/2021 1105   NA 142 09/27/2021 1225   K 5.1 11/30/2021 1105   CL 107  11/30/2021 1105   CO2 23 11/30/2021 1105   GLUCOSE 119 (H) 11/30/2021 1105   BUN 28 (H) 11/30/2021 1105   BUN 23 09/27/2021 1225   CREATININE 1.70 (H) 11/30/2021 1105   CREATININE 1.17 (H) 04/28/2016 1028   CALCIUM 9.1 11/30/2021 1105   GFRNONAA 32 (L) 11/30/2021 1105   GFRNONAA 50 (L) 06/23/2015 1711   GFRAA 52 (L) 05/15/2020 1104   GFRAA 58 (L) 06/23/2015 1711   Renal function: CrCl cannot be calculated (Patient's most recent lab result is older than the maximum 21 days allowed.).  Clinical ASCVD: No  - no event but does have CAD The ASCVD Risk score (Arnett DK, et al., 2019) failed to calculate for the following reasons:   The valid HDL cholesterol range is 20 to 100 mg/dL  A/P: Hypertension longstanding, currently close to goal on current medications. BP goal < 130/80 mmHg. Medication adherence appears suboptimal. Patient would likely benefit from reduced pill burden.  -Discontinue losartan -Start valsartan-HCTZ 160-25 mg for better BP lowering and improved adherence with combination tablet. -Return in 1 week for BMET -Continue all other antihypertensives as prescribed.     -F/u labs ordered - none. -Counseled on lifestyle modifications for blood pressure control including reduced dietary sodium, increased exercise, adequate sleep. -Encouraged patient to check BP at home and bring log of readings to next visit. Counseled on proper use of home BP cuff.   Results reviewed and written information provided. Patient verbalized understanding  of treatment plan. Total time in face-to-face counseling 35 minutes.   F/u clinic visit in 1 month.   Joseph Art, Pharm.D. PGY-2 Ambulatory Care Pharmacy Resident 01/24/2022 11:30 AM

## 2022-01-31 ENCOUNTER — Other Ambulatory Visit: Payer: Medicare HMO

## 2022-03-01 ENCOUNTER — Ambulatory Visit: Payer: Medicare HMO | Attending: Internal Medicine | Admitting: Pharmacist

## 2022-03-01 DIAGNOSIS — I10 Essential (primary) hypertension: Secondary | ICD-10-CM

## 2022-03-01 MED ORDER — VALSARTAN-HYDROCHLOROTHIAZIDE 160-25 MG PO TABS: 1.0000 | ORAL_TABLET | Freq: Every day | ORAL | 0 refills | Status: DC

## 2022-03-01 NOTE — Progress Notes (Signed)
S:     No chief complaint on file.  [name] is a 71 y.o. female who presents for hypertension evaluation, education, and management.  PMH is significant for HTN, T2DM,HL, CVA, MVR, CABG.  Patient was referred and last seen by Primary Care Provider, Dr. Wynetta Emery, on 09/06/2021.   At last visit, BP was 132/62 mmHg. It was unclear if patient is taking 25 mg vs. 50 mg dose of Losartan so, Losartan and HCTZ were switched to Valsartan/HCTZ 160-25 mg.   Today, patient arrives in good spirits and presents without assistance. Denies dizziness, headache, blurred vision, swelling.  Patient has not received Valsartan/HCTZ through mail delivery. She is still taking half of the Losartan tablet (still unclear if it is the 25 or 50 mg tablets) plus the HCTZ tablet.   Patient reports hypertension is longstanding.   Family/Social history:  Fhx: Stroke, DM, kidney disease. Tobacco: former smoker Alcohol: none reported   Medication adherence reported. Patient has not taken BP medications today.   Current antihypertensives include: Amlodipine 10 mg daily, Carvedilol 25 mg  BID, Hydralazine 100 mg BID, HCTZ 25 mg daily, Losartan 50 mg daily (unclear if taking 25 mg vs 50 mg)   Reported home BP readings: Patient has a cuff at home but doesn't check it regularly.  Patient reported dietary habits:  -Patients reports trying to cut off carb and drinking more water. -Caffeine: doesn't drink coffee. Drinks soda occasionally.  Patient-reported exercise habits:  -Patient reports 15 minutes walks about 3 times weekly.   O:  Vitals:   03/01/22 1050 03/01/22 1051  BP: (!) 161/68 (!) 150/68   Last 3 Office BP readings: BP Readings from Last 3 Encounters:  03/01/22 (!) 150/68  01/24/22 132/62  01/12/22 (!) 162/75   BMET    Component Value Date/Time   NA 139 11/30/2021 1105   NA 142 09/27/2021 1225   K 5.1 11/30/2021 1105   CL 107 11/30/2021 1105   CO2 23 11/30/2021 1105   GLUCOSE 119 (H)  11/30/2021 1105   BUN 28 (H) 11/30/2021 1105   BUN 23 09/27/2021 1225   CREATININE 1.70 (H) 11/30/2021 1105   CREATININE 1.17 (H) 04/28/2016 1028   CALCIUM 9.1 11/30/2021 1105   GFRNONAA 32 (L) 11/30/2021 1105   GFRNONAA 50 (L) 06/23/2015 1711   GFRAA 52 (L) 05/15/2020 1104   GFRAA 58 (L) 06/23/2015 1711   Renal function: CrCl cannot be calculated (Patient's most recent lab result is older than the maximum 21 days allowed.).  Clinical ASCVD: No  The ASCVD Risk score (Arnett DK, et al., 2019) failed to calculate for the following reasons:   The valid HDL cholesterol range is 20 to 100 mg/dL  A/P: Hypertension longstanding currently uncontrolled on current medications. BP goal < 130/80 mmHg. Medication adherence appears appropriate.  -Discontinued Losartan.  -Discontinued HCTZ. -Started Valsartan/HCTZ 160-25 mg daily. Provided her with number to [facility]. Advised her to contact if Diovan-HCTZ is not delivered in 1 week. -Continue amlodipine 10 mg daily.  -Patient educated on purpose, proper use, and potential adverse effects of Valsartan/HCTZ.  -F/u labs ordered - none -Counseled on lifestyle modifications for blood pressure control including reduced dietary sodium, increased exercise, adequate sleep. -Encouraged patient to check BP at home and bring log of readings to next visit. Counseled on proper use of home BP cuff.   Results reviewed and written information provided.    Written patient instructions provided. Patient verbalized understanding of treatment plan.  Total time  in face to face counseling 30 minutes.    Follow-up:  -Follow up with PCP in a month, and with pharmacist in 2 month   Patient seen with: Deirdre Evener, PharmD Candidate [employer]  Class of 2025    Pharmacist:  Benard Halsted, PharmD, [place], [place] [phone]

## 2022-03-09 ENCOUNTER — Ambulatory Visit: Payer: Self-pay | Admitting: Licensed Clinical Social Worker

## 2022-03-16 NOTE — Patient Outreach (Signed)
  Care Coordination   Initial Visit Note   03/16/2022 Name: KAORI JUMPER MRN: [id number] DOB: [birth year]  Trudee Kuster is a 71 y.o. year old female who sees [name], MD for primary care. I spoke with  Trudee Kuster by phone today.  What matters to the patients health and wellness today?  Informed pt of Care Coordination services    Goals Addressed             This Visit's Progress    COMPLETED: Care Coordination Activities-No follow up required       Care Coordination Interventions: Active listening / Reflection utilized  Emotional Support Provided LCSW informed patient of care coordination services. Pt is not interested at this time and agreed to contact PCP, should needs arise        SDOH assessments and interventions completed:  No     Care Coordination Interventions Activated:  No  Care Coordination Interventions:  No, not indicated   Follow up plan: No further intervention required.   Encounter Outcome:  Pt. Refused   Christa See, MSW, [email or web address] Phone [phone] 8:48 AM

## 2022-03-16 NOTE — Patient Instructions (Signed)
Visit Information  Thank you for taking time to visit with me today. Please don't hesitate to contact me if I can be of assistance to you.   Following are the goals we discussed today:   Goals Addressed             This Visit's Progress    COMPLETED: Care Coordination Activities-No follow up required       Care Coordination Interventions: Active listening / Reflection utilized  Emotional Support Provided LCSW informed patient of care coordination services. Pt is not interested at this time and agreed to contact PCP, should needs arise        If you are experiencing a Mental Health or [place] or need someone to talk to, please call the Suicide and Crisis Lifeline: 988 call 911   Patient verbalizes understanding of instructions and care plan provided today and agrees to view in [place]. Active MyChart status and patient understanding of how to access instructions and care plan via MyChart confirmed with patient.     No further follow up required:    Christa See, MSW, [email or web address] Phone [phone] 8:49 AM

## 2022-04-05 ENCOUNTER — Telehealth: Payer: Self-pay | Admitting: Orthopaedic Surgery

## 2022-04-05 NOTE — Telephone Encounter (Signed)
07/07/21 ov note faxed to [facility] to support need of [id number] back brace order

## 2022-04-10 ENCOUNTER — Other Ambulatory Visit: Payer: Self-pay | Admitting: *Deleted

## 2022-04-11 ENCOUNTER — Ambulatory Visit: Payer: Medicare HMO | Attending: Internal Medicine | Admitting: Internal Medicine

## 2022-04-11 ENCOUNTER — Encounter: Payer: Self-pay | Admitting: Internal Medicine

## 2022-04-11 DIAGNOSIS — I152 Hypertension secondary to endocrine disorders: Secondary | ICD-10-CM

## 2022-04-11 DIAGNOSIS — M545 Low back pain, unspecified: Secondary | ICD-10-CM | POA: Diagnosis not present

## 2022-04-11 DIAGNOSIS — E782 Mixed hyperlipidemia: Secondary | ICD-10-CM | POA: Diagnosis not present

## 2022-04-11 DIAGNOSIS — E1159 Type 2 diabetes mellitus with other circulatory complications: Secondary | ICD-10-CM

## 2022-04-11 DIAGNOSIS — E1169 Type 2 diabetes mellitus with other specified complication: Secondary | ICD-10-CM | POA: Diagnosis not present

## 2022-04-11 DIAGNOSIS — Z1211 Encounter for screening for malignant neoplasm of colon: Secondary | ICD-10-CM

## 2022-04-11 DIAGNOSIS — Z23 Encounter for immunization: Secondary | ICD-10-CM | POA: Diagnosis not present

## 2022-04-11 DIAGNOSIS — D509 Iron deficiency anemia, unspecified: Secondary | ICD-10-CM | POA: Diagnosis not present

## 2022-04-11 DIAGNOSIS — I25810 Atherosclerosis of coronary artery bypass graft(s) without angina pectoris: Secondary | ICD-10-CM | POA: Diagnosis not present

## 2022-04-11 DIAGNOSIS — N1832 Chronic kidney disease, stage 3b: Secondary | ICD-10-CM | POA: Diagnosis not present

## 2022-04-11 LAB — POCT GLYCOSYLATED HEMOGLOBIN (HGB A1C): HbA1c, POC (controlled diabetic range): 8.3 % — AB (ref 0.0–7.0)

## 2022-04-11 LAB — GLUCOSE, POCT (MANUAL RESULT ENTRY): POC Glucose: 280 mg/dl — AB (ref 70–99)

## 2022-04-11 MED ORDER — VALSARTAN-HYDROCHLOROTHIAZIDE 160-25 MG PO TABS: 1.0000 | ORAL_TABLET | Freq: Every day | ORAL | 2 refills | Status: DC

## 2022-04-11 MED ORDER — INSULIN GLARGINE 100 UNIT/ML SOLOSTAR PEN: 10.0000 [IU] | PEN_INJECTOR | Freq: Every day | SUBCUTANEOUS | 3 refills | Status: DC

## 2022-04-11 MED ORDER — FREESTYLE LIBRE SENSOR SYSTEM MISC: 12 refills | Status: DC

## 2022-04-11 MED ORDER — HYDRALAZINE HCL 100 MG PO TABS: 100.0000 mg | ORAL_TABLET | Freq: Two times a day (BID) | ORAL | 2 refills | Status: DC

## 2022-04-11 MED ORDER — OZEMPIC (0.25 OR 0.5 MG/DOSE) 2 MG/3ML ~~LOC~~ SOPN: 0.2500 mg | PEN_INJECTOR | SUBCUTANEOUS | 2 refills | Status: DC

## 2022-04-11 MED ORDER — AMLODIPINE BESYLATE 10 MG PO TABS: 10.0000 mg | ORAL_TABLET | Freq: Every day | ORAL | 2 refills | Status: DC

## 2022-04-11 MED ORDER — FREESTYLE LIBRE 14 DAY READER DEVI: 0 refills | Status: DC

## 2022-04-11 MED ORDER — ATORVASTATIN CALCIUM 80 MG PO TABS: ORAL_TABLET | ORAL | 2 refills | Status: DC

## 2022-04-11 NOTE — Progress Notes (Unsigned)
Patient ID: [name], female    DOB: February 17, 1951  MRN: [id number]  CC: Hypertension (Htn f/u. Discuss weight loss options and metformin. /Excess swelling on R knee, unable to stand for long period of time such as washing dishes. Intermittent pain on side of temple radiating to neck. Velta Addison to flu vax. )   Subjective: [name] is a 71 y.o. female who presents for chronic disease management. Her concerns today include:  Pt with hx of HTN, DM, HL, depression, chronic back pain, RT breast CA (intraductal CA, ER/PR+, lumpectomy and XRT - completed 08/2017), CKD stage 3, CVA 2011 (residual weakness on LT side), CAD s/p CABG and MVR/resection of fibroelastoma, IDA.    Did not bring meds with her  HYPERTENSION/CAD Currently taking: see medication list.  Losratan and HCTZ changed to Valsartan/HCTZ.  Reports taking this today. Also reports compliance with ,Norvasc 10 mg, Hydralazine 100 mg BID, Coreg 25 mg BID.  Took meds already for a.m doses.  Miss doses occasionally.  Taking Lipitor consistently Med Adherence: _0  Yes. Medication side effects: _1  Yes    _2  No Adherence with salt restriction: _3  Yes    _4  No Home Monitoring?: _5  Yes    _6  No but does have an arm device Monitoring Frequency:  Home BP results range:  SOB? _7  Yes    _8  No Chest Pain?: _9  Yes    _10  No Leg swelling?: _11  Yes    _12  No Headaches?: _13  Yes    _14  No Dizziness? _15  Yes    _16  No Comments:   DM/obesity: Results for orders placed or performed in visit on 04/11/22  POCT glucose (manual entry)  Result Value Ref Range   POC Glucose 280 (A) 70 - 99 mg/dl  POCT glycosylated hemoglobin (Hb A1C)  Result Value Ref Range   Hemoglobin A1C     HbA1c POC (<> result, manual entry)     HbA1c, POC (prediabetic range)     HbA1c, POC (controlled diabetic range) 8.3 (A) 0.0 - 7.0 %   Not checking BS, loss device.  Would like CGM.  Insurance did not cover the Dexcom.  Would like for me to send rxn for  Elenor Legato Taking Metformin 500 mg daily, Lantus 10 units at bed time and Novolog 4 units with 2 largest meals of the day.   Feels she does okay with eating habits, smaller portions and bake foods. Would like to try Ozempic to help with wgh loss  CKD 3b:  not on NSAIDS.  GFR 1 year ago was 46.  In June of this year it was 42 with creatinine of 1.7.  C/o swelling in her RT knee: feels the knee gets large when she is on her feet a lot.  No pain.   HM: due for flu shot and colon CA screening.  Hx of adenomatous colon polyps    Patient Active Problem List   Diagnosis Date Noted   Trigger finger, right ring finger 12/02/2021   DM type 2 with diabetic peripheral neuropathy ([place]) 07/26/2021   Bilateral low back pain with bilateral sciatica 07/26/2021   Gait abnormality 07/26/2021   Controlled substance agreement signed 12/31/2020   Shoulder stiffness, left 07/07/2020   Status post lumbar spine surgery for decompression of spinal cord 06/09/2020   Lumbar stenosis 05/27/2020   Spinal stenosis of lumbar region 05/21/2019   Complete tear of left rotator cuff 05/16/2019   Uncontrolled type 2 diabetes mellitus with hyperglycemia ([place]) 12/19/2018   Microalbuminuria  04/08/2018   Genetic testing 09/25/2017   Family history of breast cancer    Family history of prostate cancer    Malignant neoplasm of upper-outer quadrant of right breast in female, estrogen receptor positive ([place]) 06/26/2017   Lumbar herniated disc 04/15/2015   Abnormal nuclear stress test 04/03/2015   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   CAD (coronary artery disease), native coronary artery 03/30/2015   S/P mitral valve repair 11/16/2010   Dyslipidemia 06/29/2010   Diabetes ([place]) 09/29/2006   ANEMIA-IRON DEFICIENCY 09/29/2006   Essential hypertension 09/29/2006     Current Outpatient Medications on File Prior to Visit  Medication Sig Dispense Refill   Accu-Chek Softclix Lancets lancets Use as instructed 100 each 12    acetaminophen (TYLENOL) 325 MG tablet Take 1 tablet (325 mg total) by mouth every 6 (six) hours as needed. 30 tablet 2   albuterol (VENTOLIN HFA) 108 (90 Base) MCG/ACT inhaler Inhale 2 puffs into the lungs every 6 (six) hours as needed for wheezing or shortness of breath. 1 each 2   aspirin EC 81 MG tablet Take 1 tablet (81 mg total) by mouth daily. 30 tablet 5   Blood Glucose Monitoring Suppl (ACCU-CHEK AVIVA PLUS) w/Device KIT USE AS DIRECTED 1 kit 0   buPROPion (WELLBUTRIN XL) 150 MG 24 hr tablet Take 1 tablet (150 mg total) by mouth 2 (two) times daily. 28 tablet 0   carvedilol (COREG) 25 MG tablet Take 1 tablet (25 mg total) by mouth 2 (two) times daily. 180 tablet 3   cholecalciferol (VITAMIN D3) 25 MCG (1000 UNIT) tablet Take 1,000 Units by mouth daily.     diclofenac Sodium (VOLTAREN) 1 % GEL APPLY 2 GRAMS TOPICALLY 4 (FOUR) TIMES DAILY AS NEEDED. 100 g 0   DULoxetine (CYMBALTA) 30 MG capsule Take 1 capsule (30 mg total) by mouth daily. 90 capsule 3   gabapentin (NEURONTIN) 100 MG capsule Take 1 capsule (100 mg total) by mouth 3 (three) times daily. 90 capsule 11   glucose blood (ACCU-CHEK AVIVA PLUS) test strip Use as instructed 100 each 12   insulin aspart (NOVOLOG FLEXPEN) 100 UNIT/ML FlexPen Please give Relion Brand. Inj 4 units subcut with the 2 larges meals of the day 15 mL 5   Insulin Pen Needle (PEN NEEDLES) 31G X 8 MM MISC UAD 100 each 6   letrozole (FEMARA) 2.5 MG tablet TAKE 1 TABLET EVERY DAY (APPOINTMENT IS NEEDED FOR REFILLS) 90 tablet 3   metFORMIN (GLUCOPHAGE) 500 MG tablet Take 1 tablet (500 mg total) by mouth daily with breakfast. 90 tablet 2   methocarbamol (ROBAXIN) 500 MG tablet TAKE 1 TABLET EVERY 8 HOURS AS NEEDED FOR MUSCLE SPASMS. 60 tablet 1   pantoprazole (PROTONIX) 40 MG tablet TAKE 1 TABLET EVERY DAY 90 tablet 1   traZODone (DESYREL) 50 MG tablet TAKE 1 TABLET AT BEDTIME AS NEEDED FOR SLEEP 90 tablet 0   Ascorbic Acid (VITAMIN C PO) Take 500 mg by mouth daily.  (Patient not taking: Reported on 04/11/2022)     Continuous Blood Gluc Receiver (DEXCOM G6 RECEIVER) DEVI 1 Device by Does not apply route daily. (Patient not taking: Reported on 04/11/2022) 1 each 0   ferrous sulfate (FEROSUL) 325 (65 FE) MG tablet TAKE 1 TABLET (325 MG TOTAL) BY MOUTH DAILY WITH BREAKFAST. (Patient not taking: Reported on 04/11/2022) 90 tablet 0   No current facility-administered medications on file prior to visit.    No Known Allergies  Social History   Socioeconomic  History   Marital status: Married    Spouse name: Not on file   Number of children: 3   Years of education: some college   Highest education level: Not on file  Occupational History    Employer: UNEMPLOYED  Tobacco Use   Smoking status: Former    Types: Cigarettes    Quit date: 10/14/2010    Years since quitting: 11.4   Smokeless tobacco: Never  Vaping Use   Vaping Use: Never used  Substance and Sexual Activity   Alcohol use: Yes    Comment: social   Drug use: No   Sexual activity: Not Currently  Other Topics Concern   Not on file  Social History Narrative   Lives with husband, stay at home, uses cane occasionally, still active/ambulatory.    Left-handed.   No daily caffeine use.   Social Determinants of Health   Financial Resource Strain: Not on file  Food Insecurity: No Food Insecurity (05/24/2021)   Hunger Vital Sign    Worried About Running Out of Food in the Last Year: Never true    Ran Out of Food in the Last Year: Never true  Transportation Needs: Not on file  Physical Activity: Not on file  Stress: Not on file  Social Connections: Not on file  Intimate Partner Violence: Not on file    Family History  Problem Relation Age of Onset   Colon cancer Mother        dx 23s; deceased 8   Irritable bowel syndrome Mother    Prostate cancer Father        deceased 75s   Cirrhosis Sister        died of GI bleed associated with cirrhosis of the liver   Stroke Brother    Prostate  cancer Brother 58       deceased 5   Diabetes Brother        x3   Kidney disease Brother    Cancer Brother 60       unk. type   Breast cancer Cousin        several maternal 1st and 2nd cousins with breast cancer    Past Surgical History:  Procedure Laterality Date   BREAST BIOPSY Right 08/26/2019   benign   BREAST LUMPECTOMY Right    BREAST LUMPECTOMY WITH RADIOACTIVE SEED AND SENTINEL LYMPH NODE BIOPSY Right 07/20/2017   Procedure: RIGHT BREAST LUMPECTOMY WITH RADIOACTIVE SEED AND SENTINEL LYMPH NODE BIOPSY;  Surgeon: Alphonsa Overall, MD;  Location: [facility];  Service: General;  Laterality: Right;   CARDIAC CATHETERIZATION  04/03/2015   Procedure: Left Heart Cath and Cors/Grafts Angiography;  Surgeon: Peter M Martinique, MD;  Location: [facility];  Service: Cardiovascular;;   CORONARY ARTERY BYPASS GRAFT  1/12   history of ankle fractures requiring surgery     LUMBAR LAMINECTOMY/DECOMPRESSION MICRODISCECTOMY Left 04/15/2015   Procedure: LUMBAR LAMINECTOMY/DECOMPRESSION MICRODISCECTOMY 1 LEVEL;  Surgeon: Newman Pies, MD;  Location: [facility];  Service: Neurosurgery;  Laterality: Left;  Left L23 microdiskectomy   LUMBAR LAMINECTOMY/DECOMPRESSION MICRODISCECTOMY N/A 05/27/2020   Procedure: L2-3, L3-4  decompression;  Surgeon: [name], MD;  Location: [facility];  Service: Orthopedics;  Laterality: N/A;   MV repair and resection of mass  1/12   RESECTION DISTAL CLAVICAL Left 05/16/2019   Procedure: RESECTION DISTAL CLAVICAL;  Surgeon: Mcarthur Rossetti, MD;  Location: [facility];  Service: Orthopedics;  Laterality: Left;   SHOULDER ARTHROSCOPY WITH ROTATOR CUFF REPAIR  AND SUBACROMIAL DECOMPRESSION Left 05/16/2019   Procedure: SHOULDER ARTHROSCOPY WITH ROTATOR CUFF REPAIR AND SUBACROMIAL DECOMPRESSION;  Surgeon: Mcarthur Rossetti, MD;  Location: [facility];  Service: Orthopedics;  Laterality: Left;   TONSILLECTOMY  1971   TOTAL ABDOMINAL  HYSTERECTOMY  1978   TRIGGER FINGER RELEASE Right 12/02/2021   Procedure: RIGHT RING FINGER A-1 PULLEY RELEASE;  Surgeon: Mcarthur Rossetti, MD;  Location: [facility];  Service: Orthopedics;  Laterality: Right;   VESICOVAGINAL FISTULA CLOSURE W/ TAH      ROS: Review of Systems Negative except as stated above  PHYSICAL EXAM: BP 136/60   Pulse 64   Temp 98.1 F (36.7 C) (Oral)   Ht _0  (1.727 m)   Wt 245 lb 9.6 oz (111.4 kg)   SpO2 99%   BMI 37.34 kg/m   Wt Readings from Last 3 Encounters:  04/11/22 245 lb 9.6 oz (111.4 kg)  01/24/22 238 lb 6.4 oz (108.1 kg)  01/12/22 245 lb (111.1 kg)    Physical Exam  General appearance - alert, well appearing, and in no distress Mental status - normal mood, behavior, speech, dress, motor activity, and thought processes Chest - clear to auscultation, no wheezes, rales or rhonchi, symmetric air entry Heart - normal rate, regular rhythm, normal S1, S2, no murmurs, rubs, clicks or gallops Musculoskeletal -right knee: Joint is enlarged.  No soft tissue swelling noted.  She has fatty tissue more pronounced on the medial aspect of both knees. Extremities -no lower extremity edema      Latest Ref Rng & Units 11/30/2021   11:05 AM 09/27/2021   12:25 PM 09/14/2021   11:36 AM  CMP  Glucose 70 - 99 mg/dL 119  124  144   BUN 8 - 23 mg/dL 28  23  32   Creatinine 0.44 - 1.00 mg/dL 1.70  1.31  1.50   Sodium 135 - 145 mmol/L 139  142  138   Potassium 3.5 - 5.1 mmol/L 5.1  4.9  5.6   Chloride 98 - 111 mmol/L 107  107  109   CO2 22 - 32 mmol/L _1 Calcium 8.9 - 10.3 mg/dL 9.1  9.2  9.1    Lipid Panel     Component Value Date/Time   CHOL 184 09/14/2021 1115   TRIG 44 09/14/2021 1115   HDL 106 09/14/2021 1115   CHOLHDL 1.7 09/14/2021 1115   CHOLHDL 2.8 04/28/2016 1028   VLDL 19 04/28/2016 1028   LDLCALC 69 09/14/2021 1115   LDLDIRECT 101.7 07/18/2011 0901    CBC    Component Value Date/Time   WBC 12.1 (H)  04/15/2021 1929   RBC 4.29 04/15/2021 1929   HGB 11.0 (L) 04/15/2021 1929   HGB 11.9 12/31/2020 1010   HCT 35.6 (L) 04/15/2021 1929   HCT 37.4 12/31/2020 1010   PLT 224 04/15/2021 1929   PLT 267 12/31/2020 1010   MCV 83.0 04/15/2021 1929   MCV 80 12/31/2020 1010   MCH 25.6 (L) 04/15/2021 1929   MCHC 30.9 04/15/2021 1929   RDW 14.9 04/15/2021 1929   RDW 14.6 12/31/2020 1010   LYMPHSABS 1.1 04/15/2021 1929   MONOABS 0.5 04/15/2021 1929   EOSABS 0.0 04/15/2021 1929   BASOSABS 0.1 04/15/2021 1929    ASSESSMENT AND PLAN: 1. Type 2 diabetes mellitus with morbid obesity (HCC) BMI of 37 but morbid obesity diagnosis supported with complementary diagnosis of HTN and hyperlipidemia A1c not  at goal.  She will continue metformin 500 mg once a day, Lantus insulin 10 units daily and NovoLog 4 units with the 2 largest meals of the day. Add Ozempic.  I went over with her how the medication works and possible side effects including nausea, vomiting.  Adverse events can include acute pancreatitis and bowel blockage.  I went over signs and symptoms that would suggest acute pancreatitis.  If she develops any vomiting and upper abdominal pain, she should stop the medication and come in.  If she develops abdominal pain and unable to pass her bowel, again she should stop the medicine and be seen. -Clinical pharmacist was not here this afternoon to go over with her Ozempic administration.  We will have appointment scheduled with him for this. - POCT glucose (manual entry) - POCT glycosylated hemoglobin (Hb A1C) - Microalbumin / creatinine urine ratio - CBC - Comprehensive metabolic panel - Continuous Blood Gluc Sensor (FREESTYLE LIBRE SENSOR SYSTEM) MISC; Change sensor Q 2 wks  Dispense: 2 each; Refill: 12 - Continuous Blood Gluc Receiver (FREESTYLE LIBRE 14 DAY READER) DEVI; UAD  Dispense: 1 each; Refill: 0 - Semaglutide,0.25 or 0.5MG/DOS, (OZEMPIC, 0.25 OR 0.5 MG/DOSE,) 2 MG/3ML SOPN; Inject 0.25 mg into  the skin once a week.  Dispense: 3 mL; Refill: 2  2. Hypertension associated with diabetes ([place]) Repeat blood pressure today closer to goal.  She will continue current medications and low-salt diet.  Advised to check blood pressure at least twice a week with goal being 130/80 or lower. - valsartan-hydrochlorothiazide (DIOVAN-HCT) 160-25 MG tablet; Take 1 tablet by mouth daily.  Dispense: 90 tablet; Refill: 2 - hydrALAZINE (APRESOLINE) 100 MG tablet; Take 1 tablet (100 mg total) by mouth 2 (two) times daily.  Dispense: 180 tablet; Refill: 2 - amLODipine (NORVASC) 10 MG tablet; Take 1 tablet (10 mg total) by mouth daily.  Dispense: 90 tablet; Refill: 2  3. Mixed hyperlipidemia - atorvastatin (LIPITOR) 80 MG tablet; TAKE 1 TABLET EVERY DAY (DOSE INCREASE)  Dispense: 90 tablet; Refill: 2  4. Stage 3b chronic kidney disease ([place]) Recheck chemistry today to see where she is at.  Continue to avoid NSAIDs.  5. CAD of autologous artery bypass graft without angina Stable.  Continue atorvastatin, carvedilol, aspirin  6. Need for immunization against influenza Given today  7. Screening for colon cancer - Ambulatory referral to Gastroenterology   Addendum: Patient has worsening anemia compared to when CBC was last checked in October of last year.  H/H this time is 10.4/33.8 with MCV of 78.  She has history of iron deficiency anemia and was on iron in the past.  I will add iron studies.  We will have her restart iron supplement.  Patient was given the opportunity to ask questions.  Patient verbalized understanding of the plan and was able to repeat key elements of the plan.   This documentation was completed using Radio producer.  Any transcriptional errors are unintentional.  Orders Placed This Encounter  Procedures   Microalbumin / creatinine urine ratio   CBC   Comprehensive metabolic panel   Ambulatory referral to Gastroenterology   POCT glucose (manual entry)   POCT  glycosylated hemoglobin (Hb A1C)     Requested Prescriptions   Signed Prescriptions Disp Refills   Continuous Blood Gluc Sensor (FREESTYLE LIBRE SENSOR SYSTEM) MISC 2 each 12    Sig: Change sensor Q 2 wks   Continuous Blood Gluc Receiver (FREESTYLE LIBRE 14 DAY READER) DEVI 1 each 0  Sig: UAD   Semaglutide,0.25 or 0.5MG/DOS, (OZEMPIC, 0.25 OR 0.5 MG/DOSE,) 2 MG/3ML SOPN 3 mL 2    Sig: Inject 0.25 mg into the skin once a week.   valsartan-hydrochlorothiazide (DIOVAN-HCT) 160-25 MG tablet 90 tablet 2    Sig: Take 1 tablet by mouth daily.   hydrALAZINE (APRESOLINE) 100 MG tablet 180 tablet 2    Sig: Take 1 tablet (100 mg total) by mouth 2 (two) times daily.   amLODipine (NORVASC) 10 MG tablet 90 tablet 2    Sig: Take 1 tablet (10 mg total) by mouth daily.   atorvastatin (LIPITOR) 80 MG tablet 90 tablet 2    Sig: TAKE 1 TABLET EVERY DAY (DOSE INCREASE)   insulin glargine (LANTUS) 100 UNIT/ML Solostar Pen 15 mL 3    Sig: Inject 10 Units into the skin at bedtime.    Return in about 4 months (around 08/12/2022) for Give telephone appt with Angelina Sheriff for Medicare Wellness.  Give appt with Lurena Joiner for [employer].  Karle Plumber, MD, FACP

## 2022-04-12 ENCOUNTER — Other Ambulatory Visit: Payer: Self-pay | Admitting: Internal Medicine

## 2022-04-12 DIAGNOSIS — D509 Iron deficiency anemia, unspecified: Secondary | ICD-10-CM

## 2022-04-12 DIAGNOSIS — N1832 Chronic kidney disease, stage 3b: Secondary | ICD-10-CM

## 2022-04-12 LAB — COMPREHENSIVE METABOLIC PANEL
ALT: 12 IU/L (ref 0–32)
AST: 17 IU/L (ref 0–40)
Albumin/Globulin Ratio: 1.5 (ref 1.2–2.2)
Albumin: 4 g/dL (ref 3.8–4.8)
Alkaline Phosphatase: 63 IU/L (ref 44–121)
BUN/Creatinine Ratio: 16 (ref 12–28)
BUN: 23 mg/dL (ref 8–27)
Bilirubin Total: <0.2 mg/dL (ref 0.0–1.2)
CO2: 22 mmol/L (ref 20–29)
Calcium: 9 mg/dL (ref 8.7–10.3)
Chloride: 106 mmol/L (ref 96–106)
Creatinine, Ser: 1.43 mg/dL — ABNORMAL HIGH (ref 0.57–1.00)
Globulin, Total: 2.6 g/dL (ref 1.5–4.5)
Glucose: 187 mg/dL — ABNORMAL HIGH (ref 70–99)
Potassium: 4.4 mmol/L (ref 3.5–5.2)
Sodium: 142 mmol/L (ref 134–144)
Total Protein: 6.6 g/dL (ref 6.0–8.5)
eGFR: 39 mL/min/{1.73_m2} — ABNORMAL LOW (ref >59)

## 2022-04-12 LAB — CBC
Hematocrit: 33.8 % — ABNORMAL LOW (ref 34.0–46.6)
Hemoglobin: 10.4 g/dL — ABNORMAL LOW (ref 11.1–15.9)
MCH: 24.1 pg — ABNORMAL LOW (ref 26.6–33.0)
MCHC: 30.8 g/dL — ABNORMAL LOW (ref 31.5–35.7)
MCV: 78 fL — ABNORMAL LOW (ref 79–97)
Platelets: 259 10*3/uL (ref 150–450)
RBC: 4.31 x10E6/uL (ref 3.77–5.28)
RDW: 15.3 % (ref 11.7–15.4)
WBC: 7.6 10*3/uL (ref 3.4–10.8)

## 2022-04-12 LAB — MICROALBUMIN / CREATININE URINE RATIO
Creatinine, Urine: 82.4 mg/dL
Microalb/Creat Ratio: 699 mg/g creat — ABNORMAL HIGH (ref 0–29)
Microalbumin, Urine: 576.2 ug/mL

## 2022-04-12 MED ORDER — FERROUS SULFATE 325 (65 FE) MG PO TABS: ORAL_TABLET | ORAL | 1 refills | Status: DC

## 2022-04-12 NOTE — Addendum Note (Signed)
Addended by: Karle Plumber B on: 04/12/2022 12:18 PM   Modules accepted: Orders

## 2022-04-13 ENCOUNTER — Other Ambulatory Visit: Payer: Self-pay | Admitting: Internal Medicine

## 2022-04-13 ENCOUNTER — Telehealth: Payer: Self-pay | Admitting: Internal Medicine

## 2022-04-13 DIAGNOSIS — E1169 Type 2 diabetes mellitus with other specified complication: Secondary | ICD-10-CM

## 2022-04-13 LAB — IRON,TIBC AND FERRITIN PANEL
Ferritin: 28 ng/mL (ref 15–150)
Iron Saturation: 9 % — CL (ref 15–55)
Iron: 33 ug/dL (ref 27–139)
Total Iron Binding Capacity: 348 ug/dL (ref 250–450)
UIBC: 315 ug/dL (ref 118–369)

## 2022-04-13 LAB — SPECIMEN STATUS REPORT

## 2022-04-13 MED ORDER — OZEMPIC (0.25 OR 0.5 MG/DOSE) 2 MG/3ML ~~LOC~~ SOPN: 0.2500 mg | PEN_INJECTOR | SUBCUTANEOUS | 2 refills | Status: DC

## 2022-04-13 NOTE — Telephone Encounter (Signed)
Medication Refill - Medication: Semaglutide,0.25 or 0.'5MG'$ /DOS, (OZEMPIC, 0.25 OR 0.5 MG/DOSE,) 2 MG/3ML SOPN  Has the patient contacted their pharmacy? No.  Preferred Pharmacy (with phone number or street name):  [facility], [place] Phone:  [phone]  Fax:  [phone]     Has the patient been seen for an appointment in the last year OR does the patient have an upcoming appointment? Yes.    Agent: Please be advised that RX refills may take up to 3 business days. We ask that you follow-up with your pharmacy.

## 2022-04-13 NOTE — Telephone Encounter (Signed)
Change to mail order pharmacy   Requested Prescriptions  Pending Prescriptions Disp Refills  . Semaglutide,0.25 or 0.'5MG'$ /DOS, (OZEMPIC, 0.25 OR 0.5 MG/DOSE,) 2 MG/3ML SOPN 3 mL 2    Sig: Inject 0.25 mg into the skin once a week.     Endocrinology:  Diabetes - GLP-1 Receptor Agonists - semaglutide Failed - 04/13/2022  1:25 PM      Failed - HBA1C in normal range and within 180 days    HbA1c, POC (controlled diabetic range)  Date Value Ref Range Status  04/11/2022 8.3 (A) 0.0 - 7.0 % Final         Failed - Cr in normal range and within 360 days    Creatinine  Date Value Ref Range Status  12/31/2020 267.1 20.0 - 300.0 mg/dL Final   Creat  Date Value Ref Range Status  04/28/2016 1.17 (H) 0.50 - 0.99 mg/dL Final    Comment:      For patients > or = 73 years of age: The upper reference limit for Creatinine is approximately 13% higher for people identified as African-American.      Creatinine, Ser  Date Value Ref Range Status  04/11/2022 1.43 (H) 0.57 - 1.00 mg/dL Final   Creatinine,U  Date Value Ref Range Status  06/05/2010  mg/dL Final   159.6 (NOTE)  Cutoff Values for Urine Drug Screen:        Drug Class           Cutoff (ng/mL)        Amphetamines            1000        Barbiturates             200        Cocaine Metabolites      300        Benzodiazepines          200        Methadone                 300        Opiates                 2000        Phencyclidine             25        Propoxyphene             300        Marijuana Metabolites     50  For medical purposes only.         Passed - Valid encounter within last 6 months    Recent Outpatient Visits          2 days ago Type 2 diabetes mellitus with morbid obesity ([place])   [place], MD   1 month ago Essential hypertension   [place], Jarome Matin, RPH-CPP   2 months ago Essential hypertension   [place], Jarome Matin, RPH-CPP   4 months ago Essential hypertension   [place], Jarome Matin, RPH-CPP   7 months ago Type 2 diabetes mellitus with obesity [facility])   [place], MD      Future Appointments            In 2 weeks  Tresa Endo, [place]   In 3 months Wynetta Emery, Dalbert Batman, MD [place]

## 2022-04-13 NOTE — Telephone Encounter (Signed)
Phone call placed to patient today to go over lab results. -Informed that she is becoming more anemic again.  Iron studies show that she has low iron stores.  Recommend restarting iron supplement.  Prescription sent to her pharmacy.  If not covered by her insurance, she should purchase over-the-counter and take 1 tablet daily.  Advised that kidney function is not at 100% and slightly improved.  She has increased protein in the urine.  Advised that this indicates that the diabetes is adversely affecting the kidneys.  I had her verify that she has started taking the valsartan/HCTZ.  She is taking it.  Advised that I have submitted a referral for her to see the kidney specialist.  Patient is agreeable to this.

## 2022-04-15 ENCOUNTER — Ambulatory Visit: Payer: Medicare HMO | Attending: Internal Medicine | Admitting: Internal Medicine

## 2022-04-15 DIAGNOSIS — Z0000 Encounter for general adult medical examination without abnormal findings: Secondary | ICD-10-CM | POA: Diagnosis not present

## 2022-04-15 NOTE — Patient Instructions (Signed)

## 2022-04-15 NOTE — Progress Notes (Signed)
Subjective:   [name] is a 71 y.o. female who presents for Medicare Annual (Subsequent) preventive examination.  I connected with  [name] on 04/15/22 by a audio enabled telemedicine application and verified that I am speaking with the correct person using two identifiers.  Patient Location: Home  Provider Location: Home Office  I discussed the limitations of evaluation and management by telemedicine. The patient expressed understanding and agreed to proceed.  Nutrition Risk Assessment:  Has the patient had any N/V/D within the last 2 months?  No  Does the patient have any non-healing wounds?  No  Has the patient had any unintentional weight loss or weight gain?  No   Diabetes:  Is the patient diabetic?  Yes  If diabetic, was a CBG obtained today?  No  Did the patient bring in their glucometer from home?  No  How often do you monitor your CBG's? Does not monitor daily.Request a monitor because meter was lost.   Financial Strains and Diabetes Management:  Are you having any financial strains with the device, your supplies or your medication? Yes .  Does the patient want to be seen by Chronic Care Management for management of their diabetes?  No  Would the patient like to be referred to a Nutritionist or for Diabetic Management?  No   Diabetic Exams:  Diabetic Eye Exam: Completed earlier part of 2023  by Dr. Gershon Crane Diabetic Foot Exam: Completed March 2023         Objective:    There were no vitals filed for this visit. There is no height or weight on file to calculate BMI.     12/02/2021    6:42 AM 11/24/2021    1:00 PM 05/24/2021   11:09 AM 04/15/2021    6:01 PM 03/29/2021    9:59 AM 07/09/2020    8:50 AM 05/28/2020    8:00 AM  Advanced Directives  Does Patient Have a Medical Advance Directive? No Yes _0   Would patient like information on creating a medical advance directive? No - Patient declined  No - Patient declined No - Patient  declined Yes (Inpatient - patient defers creating a medical advance directive at this time - Information given) No - Patient declined No - Patient declined    Current Medications (verified) Outpatient Encounter Medications as of 04/15/2022  Medication Sig   Accu-Chek Softclix Lancets lancets Use as instructed   acetaminophen (TYLENOL) 325 MG tablet Take 1 tablet (325 mg total) by mouth every 6 (six) hours as needed.   albuterol (VENTOLIN HFA) 108 (90 Base) MCG/ACT inhaler Inhale 2 puffs into the lungs every 6 (six) hours as needed for wheezing or shortness of breath.   amLODipine (NORVASC) 10 MG tablet Take 1 tablet (10 mg total) by mouth daily.   Ascorbic Acid (VITAMIN C PO) Take 500 mg by mouth daily. (Patient not taking: Reported on 04/11/2022)   aspirin EC 81 MG tablet Take 1 tablet (81 mg total) by mouth daily.   atorvastatin (LIPITOR) 80 MG tablet TAKE 1 TABLET EVERY DAY (DOSE INCREASE)   Blood Glucose Monitoring Suppl (ACCU-CHEK AVIVA PLUS) w/Device KIT USE AS DIRECTED   buPROPion (WELLBUTRIN XL) 150 MG 24 hr tablet Take 1 tablet (150 mg total) by mouth 2 (two) times daily.   carvedilol (COREG) 25 MG tablet Take 1 tablet (25 mg total) by mouth 2 (two) times daily.   cholecalciferol (VITAMIN D3) 25 MCG (1000 UNIT) tablet Take 1,000  Units by mouth daily.   Continuous Blood Gluc Receiver (DEXCOM G6 RECEIVER) DEVI 1 Device by Does not apply route daily. (Patient not taking: Reported on 04/11/2022)   Continuous Blood Gluc Receiver (FREESTYLE LIBRE 14 DAY READER) DEVI UAD   Continuous Blood Gluc Sensor (FREESTYLE LIBRE SENSOR SYSTEM) MISC Change sensor Q 2 wks   diclofenac Sodium (VOLTAREN) 1 % GEL APPLY 2 GRAMS TOPICALLY 4 (FOUR) TIMES DAILY AS NEEDED.   DULoxetine (CYMBALTA) 30 MG capsule Take 1 capsule (30 mg total) by mouth daily.   ferrous sulfate (FEROSUL) 325 (65 FE) MG tablet TAKE 1 TABLET (325 MG TOTAL) BY MOUTH DAILY WITH BREAKFAST.   gabapentin (NEURONTIN) 100 MG capsule Take 1  capsule (100 mg total) by mouth 3 (three) times daily.   glucose blood (ACCU-CHEK AVIVA PLUS) test strip Use as instructed   hydrALAZINE (APRESOLINE) 100 MG tablet Take 1 tablet (100 mg total) by mouth 2 (two) times daily.   insulin aspart (NOVOLOG FLEXPEN) 100 UNIT/ML FlexPen Please give Relion Brand. Inj 4 units subcut with the 2 larges meals of the day   insulin glargine (LANTUS) 100 UNIT/ML Solostar Pen Inject 10 Units into the skin at bedtime.   Insulin Pen Needle (PEN NEEDLES) 31G X 8 MM MISC UAD   letrozole (FEMARA) 2.5 MG tablet TAKE 1 TABLET EVERY DAY (APPOINTMENT IS NEEDED FOR REFILLS)   metFORMIN (GLUCOPHAGE) 500 MG tablet Take 1 tablet (500 mg total) by mouth daily with breakfast.   methocarbamol (ROBAXIN) 500 MG tablet TAKE 1 TABLET EVERY 8 HOURS AS NEEDED FOR MUSCLE SPASMS.   pantoprazole (PROTONIX) 40 MG tablet TAKE 1 TABLET EVERY DAY   Semaglutide,0.25 or 0.5MG/DOS, (OZEMPIC, 0.25 OR 0.5 MG/DOSE,) 2 MG/3ML SOPN Inject 0.25 mg into the skin once a week.   traZODone (DESYREL) 50 MG tablet TAKE 1 TABLET AT BEDTIME AS NEEDED FOR SLEEP   valsartan-hydrochlorothiazide (DIOVAN-HCT) 160-25 MG tablet Take 1 tablet by mouth daily.   No facility-administered encounter medications on file as of 04/15/2022.    Allergies (verified) Patient has no known allergies.   History: Past Medical History:  Diagnosis Date   Abscess in epidural space of L2-L5 lumbar spine 03/2006   Anemia, iron deficiency    Arthritis    Breast cancer ([place]) 06/12/2017   right breast   CAD (coronary artery disease), native coronary artery 03/30/2015   Cataract    Colon polyp    a. Multiple colonic polyps status post colonoscopy in October 2007, consistent with tubular adenoma, tubulovillous adenoma with no high-grade dysplasia or malignancy identified.    Constipation    Coronary artery disease    a. s/p CABG 06/2010: S-D2; S-PDA (at time of MV surgery).   Depression    Diabetes mellitus    type II   Family  history of breast cancer    Family history of prostate cancer    Genetic testing 09/25/2017   Multi-Cancer panel (83 genes) @ [facility] - No pathogenic mutations detected   GERD (gastroesophageal reflux disease)    Hx of transient ischemic attack (TIA)    a. See stroke section.   Hyperlipidemia    Hypertension    Hypoxia    a. Has history of acute hypoxic respiratory failure in the setting of bronchitis/PNA or prior admissions.   MRSA infection    a. History of recurrent skin infection and soft tissue abscesses, with MRSA positive in the past.   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 mitral valve - s/p  resection and MV repair 06/2010 Dr. [name].   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 Distal R MCA.   Stroke [facility])    a. 11/2009: mitral mass diagnosed at this time, also has distal R MCA stenosis, tx with coumadin. b. Readmitted 05/2010 with TIA symptoms - had not been taking Coumadin. s/p MV surgery 06/2010. Coumadin stopped 2013 after review of chart by Dr. [name] since mass was removed (stroke felt possibly related to this).    Past Surgical History:  Procedure Laterality Date   BREAST BIOPSY Right 08/26/2019   benign   BREAST LUMPECTOMY Right    BREAST LUMPECTOMY WITH RADIOACTIVE SEED AND SENTINEL LYMPH NODE BIOPSY Right 07/20/2017   Procedure: RIGHT BREAST LUMPECTOMY WITH RADIOACTIVE SEED AND SENTINEL LYMPH NODE BIOPSY;  Surgeon: Alphonsa Overall, MD;  Location: [place];  Service: General;  Laterality: Right;   CARDIAC CATHETERIZATION  04/03/2015   Procedure: Left Heart Cath and Cors/Grafts Angiography;  Surgeon: Peter M Martinique, MD;  Location: [place] CV LAB;  Service: Cardiovascular;;   CORONARY ARTERY BYPASS GRAFT  1/12   history of ankle fractures requiring surgery     LUMBAR LAMINECTOMY/DECOMPRESSION MICRODISCECTOMY Left 04/15/2015   Procedure: LUMBAR  LAMINECTOMY/DECOMPRESSION MICRODISCECTOMY 1 LEVEL;  Surgeon: Newman Pies, MD;  Location: [place] [facility];  Service: Neurosurgery;  Laterality: Left;  Left L23 microdiskectomy   LUMBAR LAMINECTOMY/DECOMPRESSION MICRODISCECTOMY N/A 05/27/2020   Procedure: L2-3, L3-4  decompression;  Surgeon: [name], MD;  Location: [place];  Service: Orthopedics;  Laterality: N/A;   MV repair and resection of mass  1/12   RESECTION DISTAL CLAVICAL Left 05/16/2019   Procedure: RESECTION DISTAL CLAVICAL;  Surgeon: Mcarthur Rossetti, MD;  Location: [place];  Service: Orthopedics;  Laterality: Left;   SHOULDER ARTHROSCOPY WITH ROTATOR CUFF REPAIR AND SUBACROMIAL DECOMPRESSION Left 05/16/2019   Procedure: SHOULDER ARTHROSCOPY WITH ROTATOR CUFF REPAIR AND SUBACROMIAL DECOMPRESSION;  Surgeon: Mcarthur Rossetti, MD;  Location: [place];  Service: Orthopedics;  Laterality: Left;   TONSILLECTOMY  1971   TOTAL ABDOMINAL HYSTERECTOMY  1978   TRIGGER FINGER RELEASE Right 12/02/2021   Procedure: RIGHT RING FINGER A-1 PULLEY RELEASE;  Surgeon: Mcarthur Rossetti, MD;  Location: [place];  Service: Orthopedics;  Laterality: Right;   VESICOVAGINAL FISTULA CLOSURE W/ TAH     Family History  Problem Relation Age of Onset   Colon cancer Mother        dx 64s; deceased 72   Irritable bowel syndrome Mother    Prostate cancer Father        deceased 63s   Cirrhosis Sister        died of GI bleed associated with cirrhosis of the liver   Stroke Brother    Prostate cancer Brother 61       deceased 28   Diabetes Brother        x3   Kidney disease Brother    Cancer Brother 70       unk. type   Breast cancer Cousin        several maternal 1st and 2nd cousins with breast cancer   Social History   Socioeconomic History   Marital status: Married    Spouse name: Not on file   Number of children: 3   Years of education: some  college   Highest education level:  Not on file  Occupational History    Employer: UNEMPLOYED  Tobacco Use   Smoking status: Former    Types: Cigarettes    Quit date: 10/14/2010    Years since quitting: 11.5   Smokeless tobacco: Never  Vaping Use   Vaping Use: Never used  Substance and Sexual Activity   Alcohol use: Yes    Comment: social   Drug use: No   Sexual activity: Not Currently  Other Topics Concern   Not on file  Social History Narrative   Lives with husband, stay at home, uses cane occasionally, still active/ambulatory.    Left-handed.   No daily caffeine use.   Social Determinants of Health   Financial Resource Strain: Not on file  Food Insecurity: No Food Insecurity (05/24/2021)   Hunger Vital Sign    Worried About Running Out of Food in the Last Year: Never true    Ran Out of Food in the Last Year: Never true  Transportation Needs: Not on file  Physical Activity: Not on file  Stress: Not on file  Social Connections: Not on file    Tobacco Counseling Counseling given: Not Answered   Clinical Intake:                 Diabetic? yes        Activities of Daily Living    12/02/2021    6:48 AM  In your present state of health, do you have any difficulty performing the following activities:  Hearing? 0  Vision? 0  Difficulty concentrating or making decisions? 0  Walking or climbing stairs? 0  Dressing or bathing? 0    Patient Care Team: [name], MD as PCP - General (Internal Medicine) Sueanne Margarita, MD as PCP - Cardiology (Cardiology) Nicholas Lose, MD as Consulting Physician (Hematology and Oncology) Kyung Rudd, MD as Consulting Physician (Radiation Oncology) Delice Bison Charlestine Massed, NP as Nurse Practitioner (Hematology and Oncology) Alphonsa Overall, MD as Consulting Physician (General Surgery) [name], MD (Internal Medicine)  Indicate any recent Medical Services you may have received from other than [facility] providers in the past year (date may be  approximate).     Assessment:   This is a routine wellness examination for Gear[place].  Hearing/Vision screen No results found.  Dietary issues and exercise activities discussed:     Goals Addressed   None   Depression Screen    09/06/2021   11:37 AM 05/24/2021   11:10 AM 05/11/2021   10:25 AM 03/29/2021   10:08 AM 12/31/2020    8:55 AM 10/02/2020    3:39 PM 05/15/2020    9:34 AM  PHQ 2/9 Scores  PHQ - 2 Score 0 0 0 2 1 0 1  PHQ- 9 Score _0 0     Fall Risk    04/11/2022    1:45 PM 09/06/2021   11:30 AM 05/24/2021   11:09 AM 05/11/2021   10:25 AM 03/29/2021    9:56 AM  Fall Risk   Falls in the past year? 1 0 0 0 0  Number falls in past yr: 0 0  0 0  Injury with Fall? 0 0  0 0  Risk for fall due to : History of fall(s)   No Fall Risks No Fall Risks    FALL RISK PREVENTION PERTAINING TO THE HOME:  Any stairs in or around the home? Yes  If so, are there any  without handrails? No  Home free of loose throw rugs in walkways, pet beds, electrical cords, etc? Yes  Adequate lighting in your home to reduce risk of falls? Yes   ASSISTIVE DEVICES UTILIZED TO PREVENT FALLS:  Life alert? No  Use of a cane, walker or w/c? Yes  Grab bars in the bathroom? No  Shower chair or bench in shower? Yes  Elevated toilet seat or a handicapped toilet? Yes    Cognitive Function:    03/29/2021   10:02 AM 01/20/2020    4:53 PM  MMSE - Mini Mental State Exam  Orientation to time 5 5  Orientation to Place 5 5  Registration 3 3  Attention/ Calculation 0 5  Recall 3 2  Language- name 2 objects 2 2  Language- repeat 1 1  Language- follow 3 step command 3 3  Language- read & follow direction 1 1  Write a sentence 0 1  Copy design 0 1  Total score 23 29        Immunizations Immunization History  Administered Date(s) Administered   Fluad Quad(high Dose 65+) 04/11/2022   Influenza,inj,Quad PF,6+ Mos 06/26/2014, 03/26/2015, 04/24/2017, 04/06/2018, 04/19/2019, 03/16/2020, 03/29/2021    PFIZER(Purple Top)SARS-COV-2 Vaccination 07/16/2019, 08/06/2019   Pneumococcal Conjugate-13 04/24/2017   Pneumococcal Polysaccharide-23 08/10/2018   Tdap 03/06/2014   Zoster Recombinat (Shingrix) 01/20/2020, 03/23/2020    TDAP status: Up to date  Flu Vaccine status: Up to date  Pneumococcal vaccine status: Up to date  Covid-19 vaccine status: Completed vaccines  Qualifies for Shingles Vaccine? Yes   Zostavax completed No   Shingrix Completed?: Yes  Screening Tests Health Maintenance  Topic Date Due   COVID-19 Vaccine (3 - Pfizer risk series) 09/03/2019   COLONOSCOPY (Pts 45-29yr Insurance coverage will need to be confirmed)  11/24/2021   OPHTHALMOLOGY EXAM  08/23/2022   FOOT EXAM  09/07/2022   HEMOGLOBIN A1C  10/11/2022   Diabetic kidney evaluation - GFR measurement  04/12/2023   Diabetic kidney evaluation - Urine ACR  04/12/2023   MAMMOGRAM  09/21/2023   TETANUS/TDAP  03/06/2024   Pneumonia Vaccine 71 Years old  Completed   INFLUENZA VACCINE  Completed   DEXA SCAN  Completed   Hepatitis C Screening  Completed   Zoster Vaccines- Shingrix  Completed   HPV VACCINES  Aged Out    Health Maintenance  Health Maintenance Due  Topic Date Due   COVID-19 Vaccine (3 - Pfizer risk series) 09/03/2019   COLONOSCOPY (Pts 45-432yrInsurance coverage will need to be confirmed)  11/24/2021    Colorectal cancer screening: Referral to GI placed 04/11/2022. Pt aware the office will call re: appt.  Mammogram status: Completed 09/20/2021. Repeat every year  Bone Density status: Completed 05/28/2018. Results reflect: Bone density results: NORMAL. Repeat every   years.  Lung Cancer Screening: (Low Dose CT Chest recommended if Age 73-80ears, 30 pack-year currently smoking OR have quit w/in 15years.) does qualify.   Lung Cancer Screening Referral: to be placed by PCP  Additional Screening:  Hepatitis C Screening: does not qualify; Completed 05/23/2016  Vision Screening:  Recommended annual ophthalmology exams for early detection of glaucoma and other disorders of the eye. Is the patient up to date with their annual eye exam?  No  Who is the provider or what is the name of the office in which the patient attends annual eye exams?  If pt is not established with a provider, would they like to be referred to a provider to establish  care? Yes .   Dental Screening: Recommended annual dental exams for proper oral hygiene  Community Resource Referral / Chronic Care Management: CRR required this visit?  No   CCM required this visit?  No      Plan:     I have personally reviewed and noted the following in the patient's chart:   Medical and social history Use of alcohol, tobacco or illicit drugs  Current medications and supplements including opioid prescriptions. Patient is not currently taking opioid prescriptions. Functional ability and status Nutritional status Physical activity Advanced directives List of other physicians Hospitalizations, surgeries, and ER visits in previous 12 months Vitals Screenings to include cognitive, depression, and falls Referrals and appointments  In addition, I have reviewed and discussed with patient certain preventive protocols, quality metrics, and best practice recommendations. A written personalized care plan for preventive services as well as general preventive health recommendations were provided to patient.     Carilyn Goodpasture, RN   04/15/2022   Nurse Notes: Non-Face-to-Face 14 minutes   Ms. Borchard , Thank you for taking time to come for your Medicare Wellness Visit. I appreciate your ongoing commitment to your health goals. Please review the following plan we discussed and let me know if I can assist you in the future.   These are the goals we discussed:  Goals   None     This is a list of the screening recommended for you and due dates:  Health Maintenance  Topic Date Due   Medicare Annual Wellness  Visit  Never done   COVID-19 Vaccine (3 - Pfizer risk series) 09/03/2019   Colon Cancer Screening  11/24/2021   Eye exam for diabetics  08/23/2022   Complete foot exam   09/07/2022   Hemoglobin A1C  10/11/2022   Yearly kidney function blood test for diabetes  04/12/2023   Yearly kidney health urinalysis for diabetes  04/12/2023   Mammogram  09/21/2023   Tetanus Vaccine  03/06/2024   Pneumonia Vaccine  Completed   Flu Shot  Completed   DEXA scan (bone density measurement)  Completed   Hepatitis C Screening: USPSTF Recommendation to screen - Ages 23-79 yo.  Completed   Zoster (Shingles) Vaccine  Completed   HPV Vaccine  Aged Out

## 2022-04-25 ENCOUNTER — Other Ambulatory Visit: Payer: Self-pay | Admitting: Internal Medicine

## 2022-04-25 DIAGNOSIS — E782 Mixed hyperlipidemia: Secondary | ICD-10-CM

## 2022-04-25 DIAGNOSIS — E1159 Type 2 diabetes mellitus with other circulatory complications: Secondary | ICD-10-CM

## 2022-04-26 NOTE — Telephone Encounter (Signed)
Unable to refill per protocol, last refill by provider 04/11/22 for 90 and 2 RF.E-Prescribing Status: Receipt confirmed by pharmacy (04/11/2022 2:52 PM EDT). Will refuse.  Requested Prescriptions  Pending Prescriptions Disp Refills  . amLODipine (NORVASC) 10 MG tablet [Pharmacy Med Name: AMLODIPINE BESYLATE 10 MG Tablet] 90 tablet 10    Sig: TAKE 1 TABLET EVERY DAY     Cardiovascular: Calcium Channel Blockers 2 Passed - 04/25/2022  3:13 AM      Passed - Last BP in normal range    BP Readings from Last 1 Encounters:  04/11/22 136/60         Passed - Last Heart Rate in normal range    Pulse Readings from Last 1 Encounters:  04/11/22 64         Passed - Valid encounter within last 6 months    Recent Outpatient Visits          1 week ago Encounter for [employer] annual wellness exam   [address] [place], Neoma Laming B, MD   2 weeks ago Type 2 diabetes mellitus with morbid obesity [facility])   [place], MD   1 month ago Essential hypertension   [place], Jarome Matin, RPH-CPP   3 months ago Essential hypertension   [place], Jarome Matin, RPH-CPP   5 months ago Essential hypertension   [place], RPH-CPP      Future Appointments            In 6 days Daisy Blossom, Jarome Matin, [place]   In 3 months Wynetta Emery, Dalbert Batman, MD [place]           . atorvastatin (LIPITOR) 80 MG tablet [Pharmacy Med Name: ATORVASTATIN CALCIUM 80 MG Tablet] 90 tablet 10    Sig: TAKE 1 TABLET EVERY DAY ( DOSE INCREASE ) (NEED MD APPOINTMENT FOR REFILLS)     Cardiovascular:  Antilipid - Statins Failed - 04/25/2022  3:13 AM      Failed - Lipid Panel in normal range within the last 12 months    Cholesterol, Total  Date Value  Ref Range Status  09/14/2021 184 100 - 199 mg/dL Final   LDL Chol Calc (NIH)  Date Value Ref Range Status  09/14/2021 69 0 - 99 mg/dL Final   Direct LDL  Date Value Ref Range Status  07/18/2011 101.7 mg/dL Final    Comment:    Optimal:  <100 mg/dLNear or Above Optimal:  100-129 mg/dLBorderline High:  130-159 mg/dLHigh:  160-189 mg/dLVery High:  >190 mg/dL   HDL  Date Value Ref Range Status  09/14/2021 106 >39 mg/dL Final   Triglycerides  Date Value Ref Range Status  09/14/2021 44 0 - 149 mg/dL Final         Passed - Patient is not pregnant      Passed - Valid encounter within last 12 months    Recent Outpatient Visits          1 week ago Encounter for [employer] annual wellness exam   [place], Neoma Laming B, MD   2 weeks ago Type 2 diabetes mellitus with morbid obesity [facility])   [place] [name], MD   1 month ago Essential hypertension  [place], Jarome Matin, RPH-CPP   3 months ago Essential hypertension   [place], Jarome Matin, RPH-CPP   5 months ago Essential hypertension   [place], RPH-CPP      Future Appointments            In 6 days Daisy Blossom, Jarome Matin, [place]   In 3 months Wynetta Emery, Dalbert Batman, MD [place]

## 2022-05-02 ENCOUNTER — Ambulatory Visit: Payer: Medicare HMO | Attending: Internal Medicine | Admitting: Pharmacist

## 2022-05-02 DIAGNOSIS — Z794 Long term (current) use of insulin: Secondary | ICD-10-CM | POA: Diagnosis not present

## 2022-05-02 DIAGNOSIS — E1169 Type 2 diabetes mellitus with other specified complication: Secondary | ICD-10-CM | POA: Diagnosis not present

## 2022-05-02 DIAGNOSIS — E118 Type 2 diabetes mellitus with unspecified complications: Secondary | ICD-10-CM | POA: Diagnosis not present

## 2022-05-02 MED ORDER — OZEMPIC (0.25 OR 0.5 MG/DOSE) 2 MG/3ML ~~LOC~~ SOPN: 0.2500 mg | PEN_INJECTOR | SUBCUTANEOUS | 2 refills | Status: DC

## 2022-05-02 NOTE — Progress Notes (Signed)
    S:    No chief complaint on file.  71 y.o. female who presents for diabetes evaluation, education, and management.  PMH is significant for HTN, DM, HL, depression, chronic back pain, RT breast CA (intraductal CA, ER/PR+, lumpectomy and XRT - completed 08/2017), CKD stage 3, CVA 2011, CAD s/p CABG and MVR/resection of fibroelastoma, and IDA.  Patient was referred and last seen by Primary Care Provider, Dr. Wynetta Emery, on 04/11/2022.   At last visit, A1c had increased to 10.0 from 7.9 one year prior. Ozempic 0.25 mg weekly was started.   Today, patient arrives in good spirits and presents without any assistance. She reports she hasn't started Ozempic yet. Appears that Ozempic was sent to her mail order pharmacy and she had thought it was at [facility]. She has also not been able to start her CGM due to cost. She does report inconsistent adherence with metformin as she saw online it was 'bad' for her kidneys (skips 2-3 doses/week).   Current diabetes medications include: Novolog 4 units BID, Lantus 10 units at night, metformin 500 mg daily, Ozempic 0.25 mg weekly (not started)  Patient reports adherence to taking all medications as prescribed. Not taking metformin every day (misses 2 dose/week)   Insurance coverage: [employer]  Patient denies hypoglycemic events.  Reported home fasting blood sugars: 109-126 mg/dL. Does not check PP. She endorses she checks daily but does not provide a log today.   Patient reports nocturia (nighttime urination). At least 2x/night Patient denies neuropathy (nerve pain). Patient denies visual changes. Patient reports self foot exams.   O:  Lab Results  Component Value Date   HGBA1C 8.3 (A) 04/11/2022   Lipid Panel     Component Value Date/Time   CHOL 184 09/14/2021 1115   TRIG 44 09/14/2021 1115   HDL 106 09/14/2021 1115   CHOLHDL 1.7 09/14/2021 1115   CHOLHDL 2.8 04/28/2016 1028   VLDL 19 04/28/2016 1028   LDLCALC 69 09/14/2021 1115    LDLDIRECT 101.7 07/18/2011 0901    Clinical Atherosclerotic Cardiovascular Disease (ASCVD): Yes    A/P: Diabetes longstanding currently uncontrolled based on A1c (10%). Patient is able to verbalize appropriate hypoglycemia management plan. Medication adherence to metformin needs improvement. Control is suboptimal due to not yet starting Ozempic. -Continued current dose of Lantus 10 units daily and Novolog 4 units BID.  -Sent rx for Ozempic 0.25 mg weekly to [facility]. Copay is $70 for 2 month supply and patient is agreeable to purchasing. Discussed proper administration. Will have her follow up in 4 weeks for titration.  -Continued metformin 500 mg daily. Educated patient that she is on a reduced dose that is specific for her renal function. Emphasized importance of adherence and encouraged patient to take daily.  -Extensively discussed pathophysiology of diabetes, recommended lifestyle interventions, dietary effects on blood sugar control.  -Counseled on s/sx of and management of hypoglycemia.  -Next A1c anticipated January 2024.   Written patient instructions provided. Patient verbalized understanding of treatment plan.  Total time in face to face counseling 25 minutes.    Follow-up:  Pharmacist: 4 weeks after starting Ozempic. PCP clinic visit on 08/09/2022.   Joseph Art, Pharm.D. PGY-2 Ambulatory Care Pharmacy Resident 05/02/2022 12:31 PM

## 2022-05-23 ENCOUNTER — Ambulatory Visit: Payer: Medicare HMO | Admitting: Pharmacist

## 2022-06-07 ENCOUNTER — Ambulatory Visit: Payer: Medicare HMO | Attending: Internal Medicine | Admitting: Pharmacist

## 2022-06-07 ENCOUNTER — Encounter: Payer: Self-pay | Admitting: Pharmacist

## 2022-06-07 DIAGNOSIS — E1159 Type 2 diabetes mellitus with other circulatory complications: Secondary | ICD-10-CM | POA: Diagnosis not present

## 2022-06-07 DIAGNOSIS — I152 Hypertension secondary to endocrine disorders: Secondary | ICD-10-CM

## 2022-06-07 DIAGNOSIS — E1169 Type 2 diabetes mellitus with other specified complication: Secondary | ICD-10-CM

## 2022-06-07 MED ORDER — OZEMPIC (0.25 OR 0.5 MG/DOSE) 2 MG/3ML ~~LOC~~ SOPN: 0.5000 mg | PEN_INJECTOR | SUBCUTANEOUS | 2 refills | Status: DC

## 2022-06-07 NOTE — Progress Notes (Signed)
    S:    No chief complaint on file.  71 y.o. female who presents for diabetes evaluation, education, and management.  PMH is significant for HTN, DM, HL, depression, chronic back pain, RT breast CA (intraductal CA, ER/PR+, lumpectomy and XRT - completed 08/2017), CKD stage 3, CVA 2011, CAD s/p CABG and MVR/resection of fibroelastoma, and IDA. Patient was referred and last seen by Primary Care Provider, Dr. Wynetta Emery, on 04/11/2022.   We saw her on 05/02/2022. We had her start her Ozempic.  Today, patient arrives in good spirits and presents without any assistance. She reports she started Ozempic since last visit. Reports decrease in appetite. Denies any NV, abdominal pain. She has taken at least 4 doses.   Current diabetes medications include: Lantus 10 units at night, metformin 500 mg daily, Ozempic 0.25 mg weekly   Patient reports adherence to taking all medications as prescribed.   Insurance coverage: [place]  Patient denies hypoglycemic events.  Reported home fasting blood sugars: reports low 100s - 130s. Nothing > 200. Nothing less than 70 mg/dL.  Reported dietary habits:  -Pt is doing better with salt, hot sauce.  -Limiting sweets. Craving for sweets has gone down. Will have a Snickers Bar once every now and then.   Patient reports nocturia (nighttime urination). At least 2x/night Patient denies neuropathy (nerve pain). Patient denies visual changes. Patient reports self foot exams.   O:  Lab Results  Component Value Date   HGBA1C 8.3 (A) 04/11/2022   Lipid Panel     Component Value Date/Time   CHOL 184 09/14/2021 1115   TRIG 44 09/14/2021 1115   HDL 106 09/14/2021 1115   CHOLHDL 1.7 09/14/2021 1115   CHOLHDL 2.8 04/28/2016 1028   VLDL 19 04/28/2016 1028   LDLCALC 69 09/14/2021 1115   LDLDIRECT 101.7 07/18/2011 0901    Clinical Atherosclerotic Cardiovascular Disease (ASCVD): Yes    A/P: Diabetes longstanding currently uncontrolled based on A1c (8.3%).  Patient is able to verbalize appropriate hypoglycemia management plan. Medication adherence reported. -Continued current dose of Lantus 10 units daily. -Increase dose of Ozempic to 0.5 mg weekly. -Continued metformin 500 mg daily.  -Extensively discussed pathophysiology of diabetes, recommended lifestyle interventions, dietary effects on blood sugar control.  -Counseled on s/sx of and management of hypoglycemia.  -Next A1c anticipated January 2024.   Written patient instructions provided. Patient verbalized understanding of treatment plan.  Total time in face to face counseling 25 minutes.    Follow-up:  Pharmacist: 1 month. PCP clinic visit on 08/09/2022.   Benard Halsted, PharmD, [facility] [place], [place] [phone]

## 2022-06-13 ENCOUNTER — Telehealth: Payer: Self-pay | Admitting: Emergency Medicine

## 2022-06-13 NOTE — Telephone Encounter (Signed)
Copied from [place] [id number]. Topic: General - Inquiry >> Jun 13, 2022 10:22 AM [name] P wrote: Pt has questions about hoe she needs to be taking the ozempic.  CB@  [phone]

## 2022-06-13 NOTE — Telephone Encounter (Signed)
Patient called to ask if she can take this medication on Mondays instead of Tuesdays. I told her this was fine as long as her future injections are on Monday.

## 2022-06-22 ENCOUNTER — Emergency Department (HOSPITAL_BASED_OUTPATIENT_CLINIC_OR_DEPARTMENT_OTHER): Payer: Medicare HMO

## 2022-06-22 ENCOUNTER — Inpatient Hospital Stay (HOSPITAL_BASED_OUTPATIENT_CLINIC_OR_DEPARTMENT_OTHER)
Admission: EM | Admit: 2022-06-22 | Discharge: 2022-06-24 | DRG: 871 | Disposition: A | Discharge status: Home / Self Care | Payer: Medicare HMO | Attending: Internal Medicine | Admitting: Internal Medicine

## 2022-06-22 ENCOUNTER — Encounter (HOSPITAL_COMMUNITY): Payer: Self-pay

## 2022-06-22 ENCOUNTER — Other Ambulatory Visit: Payer: Self-pay

## 2022-06-22 ENCOUNTER — Encounter (HOSPITAL_COMMUNITY): Payer: Self-pay | Admitting: Family Medicine

## 2022-06-22 DIAGNOSIS — Z800 Family history of malignant neoplasm of digestive organs: Secondary | ICD-10-CM

## 2022-06-22 DIAGNOSIS — D509 Iron deficiency anemia, unspecified: Secondary | ICD-10-CM | POA: Diagnosis present

## 2022-06-22 DIAGNOSIS — Z1152 Encounter for screening for COVID-19: Secondary | ICD-10-CM | POA: Diagnosis not present

## 2022-06-22 DIAGNOSIS — Z841 Family history of disorders of kidney and ureter: Secondary | ICD-10-CM

## 2022-06-22 DIAGNOSIS — Z951 Presence of aortocoronary bypass graft: Secondary | ICD-10-CM

## 2022-06-22 DIAGNOSIS — J9811 Atelectasis: Secondary | ICD-10-CM | POA: Diagnosis not present

## 2022-06-22 DIAGNOSIS — I2510 Atherosclerotic heart disease of native coronary artery without angina pectoris: Secondary | ICD-10-CM | POA: Diagnosis present

## 2022-06-22 DIAGNOSIS — Z923 Personal history of irradiation: Secondary | ICD-10-CM

## 2022-06-22 DIAGNOSIS — M1990 Unspecified osteoarthritis, unspecified site: Secondary | ICD-10-CM | POA: Diagnosis present

## 2022-06-22 DIAGNOSIS — R0902 Hypoxemia: Secondary | ICD-10-CM

## 2022-06-22 DIAGNOSIS — E785 Hyperlipidemia, unspecified: Secondary | ICD-10-CM | POA: Diagnosis present

## 2022-06-22 DIAGNOSIS — M25511 Pain in right shoulder: Secondary | ICD-10-CM | POA: Diagnosis not present

## 2022-06-22 DIAGNOSIS — I10 Essential (primary) hypertension: Secondary | ICD-10-CM | POA: Diagnosis not present

## 2022-06-22 DIAGNOSIS — I129 Hypertensive chronic kidney disease with stage 1 through stage 4 chronic kidney disease, or unspecified chronic kidney disease: Secondary | ICD-10-CM | POA: Diagnosis not present

## 2022-06-22 DIAGNOSIS — Z8673 Personal history of transient ischemic attack (TIA), and cerebral infarction without residual deficits: Secondary | ICD-10-CM

## 2022-06-22 DIAGNOSIS — R6520 Severe sepsis without septic shock: Secondary | ICD-10-CM | POA: Diagnosis present

## 2022-06-22 DIAGNOSIS — Z833 Family history of diabetes mellitus: Secondary | ICD-10-CM | POA: Diagnosis not present

## 2022-06-22 DIAGNOSIS — R059 Cough, unspecified: Secondary | ICD-10-CM | POA: Diagnosis not present

## 2022-06-22 DIAGNOSIS — A419 Sepsis, unspecified organism: Secondary | ICD-10-CM | POA: Diagnosis not present

## 2022-06-22 DIAGNOSIS — R531 Weakness: Secondary | ICD-10-CM | POA: Diagnosis not present

## 2022-06-22 DIAGNOSIS — F32A Depression, unspecified: Secondary | ICD-10-CM | POA: Diagnosis present

## 2022-06-22 DIAGNOSIS — Z823 Family history of stroke: Secondary | ICD-10-CM

## 2022-06-22 DIAGNOSIS — Z8042 Family history of malignant neoplasm of prostate: Secondary | ICD-10-CM

## 2022-06-22 DIAGNOSIS — Z794 Long term (current) use of insulin: Secondary | ICD-10-CM

## 2022-06-22 DIAGNOSIS — Z79899 Other long term (current) drug therapy: Secondary | ICD-10-CM

## 2022-06-22 DIAGNOSIS — Z7984 Long term (current) use of oral hypoglycemic drugs: Secondary | ICD-10-CM

## 2022-06-22 DIAGNOSIS — E7800 Pure hypercholesterolemia, unspecified: Secondary | ICD-10-CM | POA: Diagnosis present

## 2022-06-22 DIAGNOSIS — Z853 Personal history of malignant neoplasm of breast: Secondary | ICD-10-CM

## 2022-06-22 DIAGNOSIS — E1165 Type 2 diabetes mellitus with hyperglycemia: Secondary | ICD-10-CM | POA: Diagnosis not present

## 2022-06-22 DIAGNOSIS — Z6836 Body mass index (BMI) 36.0-36.9, adult: Secondary | ICD-10-CM | POA: Diagnosis not present

## 2022-06-22 DIAGNOSIS — K219 Gastro-esophageal reflux disease without esophagitis: Secondary | ICD-10-CM | POA: Diagnosis present

## 2022-06-22 DIAGNOSIS — J189 Pneumonia, unspecified organism: Secondary | ICD-10-CM | POA: Diagnosis not present

## 2022-06-22 DIAGNOSIS — E669 Obesity, unspecified: Secondary | ICD-10-CM | POA: Diagnosis present

## 2022-06-22 DIAGNOSIS — G4730 Sleep apnea, unspecified: Secondary | ICD-10-CM | POA: Diagnosis present

## 2022-06-22 DIAGNOSIS — J9601 Acute respiratory failure with hypoxia: Secondary | ICD-10-CM | POA: Diagnosis not present

## 2022-06-22 DIAGNOSIS — G8929 Other chronic pain: Secondary | ICD-10-CM | POA: Diagnosis present

## 2022-06-22 DIAGNOSIS — Z87891 Personal history of nicotine dependence: Secondary | ICD-10-CM

## 2022-06-22 DIAGNOSIS — N1832 Chronic kidney disease, stage 3b: Secondary | ICD-10-CM | POA: Diagnosis not present

## 2022-06-22 DIAGNOSIS — Z7982 Long term (current) use of aspirin: Secondary | ICD-10-CM

## 2022-06-22 DIAGNOSIS — Z803 Family history of malignant neoplasm of breast: Secondary | ICD-10-CM | POA: Diagnosis not present

## 2022-06-22 DIAGNOSIS — E1122 Type 2 diabetes mellitus with diabetic chronic kidney disease: Secondary | ICD-10-CM | POA: Diagnosis not present

## 2022-06-22 DIAGNOSIS — Z86010 Personal history of colonic polyps: Secondary | ICD-10-CM

## 2022-06-22 DIAGNOSIS — Z8614 Personal history of Methicillin resistant Staphylococcus aureus infection: Secondary | ICD-10-CM

## 2022-06-22 DIAGNOSIS — K5900 Constipation, unspecified: Secondary | ICD-10-CM | POA: Diagnosis present

## 2022-06-22 LAB — CBC WITH DIFFERENTIAL/PLATELET
Abs Immature Granulocytes: 0.27 10*3/uL — ABNORMAL HIGH (ref 0.00–0.07)
Basophils Absolute: 0.1 10*3/uL (ref 0.0–0.1)
Basophils Relative: 0 %
Eosinophils Absolute: 0.3 10*3/uL (ref 0.0–0.5)
Eosinophils Relative: 1 %
HCT: 31.8 % — ABNORMAL LOW (ref 36.0–46.0)
Hemoglobin: 10.3 g/dL — ABNORMAL LOW (ref 12.0–15.0)
Immature Granulocytes: 1 %
Lymphocytes Relative: 11 %
Lymphs Abs: 2.3 10*3/uL (ref 0.7–4.0)
MCH: 24.6 pg — ABNORMAL LOW (ref 26.0–34.0)
MCHC: 32.4 g/dL (ref 30.0–36.0)
MCV: 76.1 fL — ABNORMAL LOW (ref 80.0–100.0)
Monocytes Absolute: 0.7 10*3/uL (ref 0.1–1.0)
Monocytes Relative: 3 %
Neutro Abs: 16.7 10*3/uL — ABNORMAL HIGH (ref 1.7–7.7)
Neutrophils Relative %: 84 %
Platelets: 265 10*3/uL (ref 150–400)
RBC: 4.18 MIL/uL (ref 3.87–5.11)
RDW: 17 % — ABNORMAL HIGH (ref 11.5–15.5)
WBC: 20.3 10*3/uL — ABNORMAL HIGH (ref 4.0–10.5)
nRBC: 0 % (ref 0.0–0.2)

## 2022-06-22 LAB — GLUCOSE, CAPILLARY
Glucose-Capillary: 178 mg/dL — ABNORMAL HIGH (ref 70–99)
Glucose-Capillary: 181 mg/dL — ABNORMAL HIGH (ref 70–99)

## 2022-06-22 LAB — RESP PANEL BY RT-PCR (RSV, FLU A&B, COVID)  RVPGX2
Influenza A by PCR: NEGATIVE
Influenza B by PCR: NEGATIVE
Resp Syncytial Virus by PCR: NEGATIVE
SARS Coronavirus 2 by RT PCR: NEGATIVE

## 2022-06-22 LAB — LACTIC ACID, PLASMA: Lactic Acid, Venous: 1.5 mmol/L (ref 0.5–1.9)

## 2022-06-22 LAB — PROCALCITONIN: Procalcitonin: 27.39 ng/mL

## 2022-06-22 LAB — BASIC METABOLIC PANEL
Anion gap: 10 (ref 5–15)
BUN: 33 mg/dL — ABNORMAL HIGH (ref 8–23)
CO2: 22 mmol/L (ref 22–32)
Calcium: 8.7 mg/dL — ABNORMAL LOW (ref 8.9–10.3)
Chloride: 104 mmol/L (ref 98–111)
Creatinine, Ser: 1.68 mg/dL — ABNORMAL HIGH (ref 0.44–1.00)
GFR, Estimated: 32 mL/min — ABNORMAL LOW (ref >60)
Glucose, Bld: 145 mg/dL — ABNORMAL HIGH (ref 70–99)
Potassium: 4.7 mmol/L (ref 3.5–5.1)
Sodium: 136 mmol/L (ref 135–145)

## 2022-06-22 MED ORDER — ATORVASTATIN CALCIUM 40 MG PO TABS
80.0000 mg | ORAL_TABLET | Freq: Every day | ORAL | Status: DC
  Administered 2022-06-23 – 2022-06-24 (×2): 80 mg via ORAL
  Filled 2022-06-22 (×2): qty 2

## 2022-06-22 MED ORDER — ACETAMINOPHEN 650 MG RE SUPP: 650.0000 mg | Freq: Four times a day (QID) | RECTAL | Status: DC | PRN

## 2022-06-22 MED ORDER — CARVEDILOL 25 MG PO TABS
25.0000 mg | ORAL_TABLET | Freq: Two times a day (BID) | ORAL | Status: DC
  Administered 2022-06-22 – 2022-06-24 (×4): 25 mg via ORAL
  Filled 2022-06-22 (×4): qty 1

## 2022-06-22 MED ORDER — SODIUM CHLORIDE 0.9 % IV SOLN
2.0000 g | INTRAVENOUS | Status: DC
  Administered 2022-06-23: 2 g via INTRAVENOUS
  Filled 2022-06-22: qty 20

## 2022-06-22 MED ORDER — ASPIRIN 81 MG PO TBEC
81.0000 mg | DELAYED_RELEASE_TABLET | Freq: Every day | ORAL | Status: DC
  Administered 2022-06-23 – 2022-06-24 (×2): 81 mg via ORAL
  Filled 2022-06-22 (×2): qty 1

## 2022-06-22 MED ORDER — ALBUTEROL SULFATE (2.5 MG/3ML) 0.083% IN NEBU: 3.0000 mL | INHALATION_SOLUTION | Freq: Four times a day (QID) | RESPIRATORY_TRACT | Status: DC | PRN

## 2022-06-22 MED ORDER — VALSARTAN-HYDROCHLOROTHIAZIDE 160-25 MG PO TABS: 1.0000 | ORAL_TABLET | Freq: Every day | ORAL | Status: DC

## 2022-06-22 MED ORDER — HYDRALAZINE HCL 50 MG PO TABS
100.0000 mg | ORAL_TABLET | Freq: Two times a day (BID) | ORAL | Status: DC
  Administered 2022-06-22 – 2022-06-24 (×4): 100 mg via ORAL
  Filled 2022-06-22 (×4): qty 2

## 2022-06-22 MED ORDER — INSULIN GLARGINE-YFGN 100 UNIT/ML ~~LOC~~ SOLN
6.0000 [IU] | Freq: Every day | SUBCUTANEOUS | Status: DC
  Administered 2022-06-22 – 2022-06-23 (×2): 6 [IU] via SUBCUTANEOUS
  Filled 2022-06-22 (×3): qty 0.06

## 2022-06-22 MED ORDER — SODIUM CHLORIDE 0.9 % IV SOLN
500.0000 mg | Freq: Once | INTRAVENOUS | Status: AC
  Administered 2022-06-22: 500 mg via INTRAVENOUS
  Filled 2022-06-22: qty 5

## 2022-06-22 MED ORDER — SENNOSIDES-DOCUSATE SODIUM 8.6-50 MG PO TABS: 1.0000 | ORAL_TABLET | Freq: Every evening | ORAL | Status: DC | PRN

## 2022-06-22 MED ORDER — GUAIFENESIN 100 MG/5ML PO LIQD
5.0000 mL | ORAL | Status: DC | PRN
  Administered 2022-06-22 – 2022-06-24 (×5): 5 mL via ORAL
  Filled 2022-06-22 (×5): qty 10

## 2022-06-22 MED ORDER — INSULIN ASPART 100 UNIT/ML IJ SOLN
0.0000 [IU] | Freq: Three times a day (TID) | INTRAMUSCULAR | Status: DC
  Administered 2022-06-23: 1 [IU] via SUBCUTANEOUS
  Administered 2022-06-24: 2 [IU] via SUBCUTANEOUS

## 2022-06-22 MED ORDER — LETROZOLE 2.5 MG PO TABS
2.5000 mg | ORAL_TABLET | Freq: Every day | ORAL | Status: DC
  Administered 2022-06-23 – 2022-06-24 (×2): 2.5 mg via ORAL
  Filled 2022-06-22 (×2): qty 1

## 2022-06-22 MED ORDER — INSULIN ASPART 100 UNIT/ML IJ SOLN
0.0000 [IU] | Freq: Every day | INTRAMUSCULAR | Status: DC
  Administered 2022-06-23: 2 [IU] via SUBCUTANEOUS

## 2022-06-22 MED ORDER — PANTOPRAZOLE SODIUM 40 MG PO TBEC
40.0000 mg | DELAYED_RELEASE_TABLET | Freq: Every day | ORAL | Status: DC
  Administered 2022-06-23 – 2022-06-24 (×2): 40 mg via ORAL
  Filled 2022-06-22 (×2): qty 1

## 2022-06-22 MED ORDER — AMLODIPINE BESYLATE 10 MG PO TABS
10.0000 mg | ORAL_TABLET | Freq: Every day | ORAL | Status: DC
  Administered 2022-06-23 – 2022-06-24 (×2): 10 mg via ORAL
  Filled 2022-06-22 (×2): qty 1

## 2022-06-22 MED ORDER — HYDROCHLOROTHIAZIDE 25 MG PO TABS
25.0000 mg | ORAL_TABLET | Freq: Every day | ORAL | Status: DC
  Administered 2022-06-23 – 2022-06-24 (×2): 25 mg via ORAL
  Filled 2022-06-22 (×2): qty 1

## 2022-06-22 MED ORDER — SODIUM CHLORIDE 0.9 % IV SOLN
2.0000 g | Freq: Once | INTRAVENOUS | Status: AC
  Administered 2022-06-22: 2 g via INTRAVENOUS
  Filled 2022-06-22: qty 20

## 2022-06-22 MED ORDER — HEPARIN SODIUM (PORCINE) 5000 UNIT/ML IJ SOLN
5000.0000 [IU] | Freq: Three times a day (TID) | INTRAMUSCULAR | Status: DC
  Administered 2022-06-22 – 2022-06-24 (×5): 5000 [IU] via SUBCUTANEOUS
  Filled 2022-06-22 (×5): qty 1

## 2022-06-22 MED ORDER — ZOLPIDEM TARTRATE 5 MG PO TABS
5.0000 mg | ORAL_TABLET | Freq: Every evening | ORAL | Status: AC | PRN
  Administered 2022-06-22: 5 mg via ORAL
  Filled 2022-06-22: qty 1

## 2022-06-22 MED ORDER — AZITHROMYCIN 250 MG PO TABS
500.0000 mg | ORAL_TABLET | Freq: Every day | ORAL | Status: DC
  Administered 2022-06-23 – 2022-06-24 (×2): 500 mg via ORAL
  Filled 2022-06-22 (×2): qty 2

## 2022-06-22 MED ORDER — IRBESARTAN 150 MG PO TABS
150.0000 mg | ORAL_TABLET | Freq: Every day | ORAL | Status: DC
  Administered 2022-06-23 – 2022-06-24 (×2): 150 mg via ORAL
  Filled 2022-06-22 (×2): qty 1

## 2022-06-22 MED ORDER — ACETAMINOPHEN 325 MG PO TABS
650.0000 mg | ORAL_TABLET | Freq: Four times a day (QID) | ORAL | Status: DC | PRN
  Administered 2022-06-22 – 2022-06-24 (×4): 650 mg via ORAL
  Filled 2022-06-22 (×4): qty 2

## 2022-06-22 NOTE — H&P (Signed)
History and Physical    NASRA COUNCE LHT:[id number] DOB: [birth year] DOA: 06/22/2022  PCP: [name], MD   Patient coming from: home   Chief Complaint: Cough, SOB, chills, fatigue   HPI: CRAIG WISNEWSKI is a pleasant 71 y.o. female with medical history significant for hypertension, type 2 diabetes mellitus, CAD, CKD 3B, and history of cancer of the right breast who presents to the emergency department with cough, shortness of breath, chills, and fatigue.  She began feeling generally poor approximately 1 week ago with fatigue, aches, and cough.  She worsened acutely yesterday with severe chills, increased fatigue, increased shortness of breath, and worsening cough with sputum production.  [facility] ED Course: Upon arrival to the ED, patient is found to be febrile to 38.7 C with slightly elevated heart rate and stable blood pressure.  Chest x-ray concerning for left lower lobe pneumonia.  Labs notable for negative COVID, influenza, and RSV PCR, leukocytosis to 20,300, normal lactic acid, and creatinine 1.68.  Patient was treated with Rocephin and azithromycin in the emergency department.  She was transferred to [facility] for admission.  Review of Systems:  All other systems reviewed and apart from HPI, are negative.  Past Medical History:  Diagnosis Date   Abscess in epidural space of L2-L5 lumbar spine 03/2006   Anemia, iron deficiency    Arthritis    Breast cancer ([place]) 06/12/2017   right breast   CAD (coronary artery disease), native coronary artery 03/30/2015   Cataract    Colon polyp    a. Multiple colonic polyps status post colonoscopy in October 2007, consistent with tubular adenoma, tubulovillous adenoma with no high-grade dysplasia or malignancy identified.    Constipation    Coronary artery disease    a. s/p CABG 06/2010: S-D2; S-PDA (at time of MV surgery).   Depression    Diabetes mellitus    type II   Family history of breast cancer    Family  history of prostate cancer    Genetic testing 09/25/2017   Multi-Cancer panel (83 genes) @ [facility] - No pathogenic mutations detected   GERD (gastroesophageal reflux disease)    Hx of transient ischemic attack (TIA)    a. See stroke section.   Hyperlipidemia    Hypertension    Hypoxia    a. Has history of acute hypoxic respiratory failure in the setting of bronchitis/PNA or prior admissions.   MRSA infection    a. History of recurrent skin infection and soft tissue abscesses, with MRSA positive in the past.   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 mitral valve - s/p resection and MV repair 06/2010 Dr. [name].   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 Distal R MCA.   Stroke [facility])    a. 11/2009: mitral mass diagnosed at this time, also has distal R MCA stenosis, tx with coumadin. b. Readmitted 05/2010 with TIA symptoms - had not been taking Coumadin. s/p MV surgery 06/2010. Coumadin stopped 2013 after review of chart by Dr. [name] since mass was removed (stroke felt possibly related to this).     Past Surgical History:  Procedure Laterality Date   BREAST BIOPSY Right 08/26/2019   benign   BREAST LUMPECTOMY Right    BREAST LUMPECTOMY WITH RADIOACTIVE SEED AND SENTINEL LYMPH NODE BIOPSY Right 07/20/2017  Procedure: RIGHT BREAST LUMPECTOMY WITH RADIOACTIVE SEED AND SENTINEL LYMPH NODE BIOPSY;  Surgeon: Alphonsa Overall, MD;  Location: [place] [facility];  Service: General;  Laterality: Right;   CARDIAC CATHETERIZATION  04/03/2015   Procedure: Left Heart Cath and Cors/Grafts Angiography;  Surgeon: Peter M Martinique, MD;  Location: [place] CV LAB;  Service: Cardiovascular;;   CORONARY ARTERY BYPASS GRAFT  1/12   history of ankle fractures requiring surgery     LUMBAR LAMINECTOMY/DECOMPRESSION MICRODISCECTOMY Left 04/15/2015   Procedure: LUMBAR LAMINECTOMY/DECOMPRESSION  MICRODISCECTOMY 1 LEVEL;  Surgeon: Newman Pies, MD;  Location: [place] [facility]UR[facility];  Service: Neurosurgery;  Laterality: Left;  Left L23 microdiskectomy   LUMBAR LAMINECTOMY/DECOMPRESSION MICRODISCECTOMY N/A 05/27/2020   Procedure: L2-3, L3-4  decompression;  Surgeon: [name], MD;  Location: [place];  Service: Orthopedics;  Laterality: N/A;   MV repair and resection of mass  1/12   RESECTION DISTAL CLAVICAL Left 05/16/2019   Procedure: RESECTION DISTAL CLAVICAL;  Surgeon: Mcarthur Rossetti, MD;  Location: [place];  Service: Orthopedics;  Laterality: Left;   SHOULDER ARTHROSCOPY WITH ROTATOR CUFF REPAIR AND SUBACROMIAL DECOMPRESSION Left 05/16/2019   Procedure: SHOULDER ARTHROSCOPY WITH ROTATOR CUFF REPAIR AND SUBACROMIAL DECOMPRESSION;  Surgeon: Mcarthur Rossetti, MD;  Location: [place];  Service: Orthopedics;  Laterality: Left;   TONSILLECTOMY  1971   TOTAL ABDOMINAL HYSTERECTOMY  1978   TRIGGER FINGER RELEASE Right 12/02/2021   Procedure: RIGHT RING FINGER A-1 PULLEY RELEASE;  Surgeon: Mcarthur Rossetti, MD;  Location: [place];  Service: Orthopedics;  Laterality: Right;   VESICOVAGINAL FISTULA CLOSURE W/ TAH      Social History:   reports that she quit smoking about 11 years ago. Her smoking use included cigarettes. She has never used smokeless tobacco. She reports current alcohol use. She reports that she does not use drugs.  No Known Allergies  Family History  Problem Relation Age of Onset   Colon cancer Mother        dx 22s; deceased 57   Irritable bowel syndrome Mother    Prostate cancer Father        deceased 54s   Cirrhosis Sister        died of GI bleed associated with cirrhosis of the liver   Stroke Brother    Prostate cancer Brother 64       deceased 76   Diabetes Brother        x3   Kidney disease Brother    Cancer Brother 23       unk. type   Breast cancer Cousin        several maternal 1st and  2nd cousins with breast cancer     Prior to Admission medications   Medication Sig Start Date End Date Taking? Authorizing Provider  Accu-Chek Softclix Lancets lancets Use as instructed 07/30/20   [name], MD  acetaminophen (TYLENOL) 325 MG tablet Take 1 tablet (325 mg total) by mouth every 6 (six) hours as needed. 01/12/22   Rising, Wells Guiles, PA-C  albuterol (VENTOLIN HFA) 108 (90 Base) MCG/ACT inhaler Inhale 2 puffs into the lungs every 6 (six) hours as needed for wheezing or shortness of breath. 07/29/20   [name], MD  amLODipine (NORVASC) 10 MG tablet Take 1 tablet (10 mg total) by mouth daily. 04/11/22   [name], MD  Ascorbic Acid (VITAMIN C PO) Take 500 mg by mouth daily.    [provider]  aspirin EC  81 MG tablet Take 1 tablet (81 mg total) by mouth daily. 06/26/14   Lance Bosch, NP  atorvastatin (LIPITOR) 80 MG tablet TAKE 1 TABLET EVERY DAY (DOSE INCREASE) 04/11/22   [name], MD  Blood Glucose Monitoring Suppl (ACCU-CHEK AVIVA PLUS) w/Device KIT USE AS DIRECTED 05/31/21   [name], MD  buPROPion (WELLBUTRIN XL) 150 MG 24 hr tablet Take 1 tablet (150 mg total) by mouth 2 (two) times daily. 07/19/21   [name], MD  carvedilol (COREG) 25 MG tablet Take 1 tablet (25 mg total) by mouth 2 (two) times daily. 11/23/21   Sueanne Margarita, MD  cholecalciferol (VITAMIN D3) 25 MCG (1000 UNIT) tablet Take 1,000 Units by mouth daily.    [provider]  Continuous Blood Gluc Receiver (DEXCOM G6 RECEIVER) DEVI 1 Device by Does not apply route daily. 09/06/21   [name], MD  Continuous Blood Gluc Receiver (FREESTYLE LIBRE 14 DAY READER) DEVI UAD 04/11/22   [name], MD  Continuous Blood Gluc Sensor (FREESTYLE LIBRE SENSOR SYSTEM) MISC Change sensor Q 2 wks 04/11/22   [name], MD  diclofenac Sodium (VOLTAREN) 1 % GEL APPLY 2 GRAMS TOPICALLY 4 (FOUR) TIMES DAILY AS NEEDED. 01/20/20   [name], MD  DULoxetine (CYMBALTA) 30 MG capsule Take 1 capsule (30 mg total) by mouth daily. 10/05/21   Marcial Pacas, MD  ferrous sulfate (FEROSUL) 325 (65 FE) MG tablet TAKE 1 TABLET (325 MG TOTAL) BY MOUTH DAILY WITH BREAKFAST. 04/12/22   [name], MD  gabapentin (NEURONTIN) 100 MG capsule Take 1 capsule (100 mg total) by mouth 3 (three) times daily. 07/26/21   Marcial Pacas, MD  glucose blood (ACCU-CHEK AVIVA PLUS) test strip Use as instructed 07/30/20   [name], MD  hydrALAZINE (APRESOLINE) 100 MG tablet Take 1 tablet (100 mg total) by mouth 2 (two) times daily. 04/11/22   [name], MD  insulin glargine (LANTUS) 100 UNIT/ML Solostar Pen Inject 10 Units into the skin at bedtime. 04/11/22   [name], MD  Insulin Pen Needle (PEN NEEDLES) 31G X 8 MM MISC UAD 04/07/21   [name], MD  letrozole [facility]) 2.5 MG tablet TAKE 1 TABLET EVERY DAY (APPOINTMENT IS NEEDED FOR REFILLS) 11/23/21   Gardenia Phlegm, NP  metFORMIN (GLUCOPHAGE) 500 MG tablet Take 1 tablet (500 mg total) by mouth daily with breakfast. 09/15/21   [name], MD  methocarbamol (ROBAXIN) 500 MG tablet TAKE 1 TABLET EVERY 8 HOURS AS NEEDED FOR MUSCLE SPASMS. 10/07/21   [name], MD  pantoprazole (PROTONIX) 40 MG tablet TAKE 1 TABLET EVERY DAY 10/07/21   [name], MD  Semaglutide,0.25 or 0.5MG/DOS, (OZEMPIC, 0.25 OR 0.5 MG/DOSE,) 2 MG/3ML SOPN Inject 0.5 mg into the skin once a week. 06/07/22   [name], MD  traZODone (DESYREL) 50 MG tablet TAKE 1 TABLET AT BEDTIME AS NEEDED FOR SLEEP 08/27/21   [name], MD  valsartan-hydrochlorothiazide (DIOVAN-HCT) 160-25 MG tablet Take 1 tablet by mouth daily. 04/11/22   [name], MD    Physical Exam: Vitals:   06/22/22 1600 06/22/22 1720 06/22/22 1721 06/22/22 1828  BP:  (!) 166/142 (!) 163/63 (!) 176/84  Pulse:  (!) 104 (!) 101 (!) 101  Resp:  _0 Temp: (!) 100.6 F (38.1 C)  (!) 101.6 F  (38.7 C) 100 F (37.8 C)  TempSrc: Oral  Oral Oral  SpO2:  (!) 88% (!) 89% 91%  Weight:      Height:         Constitutional: NAD, calm  Eyes: PERTLA, lids and conjunctivae normal ENMT: Mucous membranes are moist. Posterior pharynx clear of any exudate or lesions.   Neck: supple, no masses  Respiratory: Coarse ronchi on left, no wheezing. No accessory muscle use.  Cardiovascular: S1 & S2 heard, regular rate and rhythm. No JVD. Abdomen: No distension, no tenderness, soft. Bowel sounds active.  Musculoskeletal: no clubbing / cyanosis. No joint deformity upper and lower extremities.   Skin: no significant rashes, lesions, ulcers. Warm, dry, well-perfused. Neurologic: CN 2-12 grossly intact. Moving all extremities. Alert and oriented.  Psychiatric: Pleasant. Cooperative.    Labs and Imaging on Admission: I have personally reviewed following labs and imaging studies  CBC: Recent Labs  Lab 06/22/22 1512  WBC 20.3*  NEUTROABS 16.7*  HGB 10.3*  HCT 31.8*  MCV 76.1*  PLT 751   Basic Metabolic Panel: Recent Labs  Lab 06/22/22 1512  NA 136  K 4.7  CL 104  CO2 22  GLUCOSE 145*  BUN 33*  CREATININE 1.68*  CALCIUM 8.7*   GFR: Estimated Creatinine Clearance: 38.7 mL/min (A) (by C-G formula based on SCr of 1.68 mg/dL (H)). Liver Function Tests: No results for input(s): "AST", "ALT", "ALKPHOS", "BILITOT", "PROT", "ALBUMIN" in the last 168 hours. No results for input(s): "LIPASE", "AMYLASE" in the last 168 hours. No results for input(s): "AMMONIA" in the last 168 hours. Coagulation Profile: No results for input(s): "INR", "PROTIME" in the last 168 hours. Cardiac Enzymes: No results for input(s): "CKTOTAL", "CKMB", "CKMBINDEX", "TROPONINI" in the last 168 hours. BNP (last 3 results) No results for input(s): "PROBNP" in the last 8760 hours. HbA1C: No results for input(s): "HGBA1C" in the last 72 hours. CBG: No results for input(s): "GLUCAP" in the last 168 hours. Lipid  Profile: No results for input(s): "CHOL", "HDL", "LDLCALC", "TRIG", "CHOLHDL", "LDLDIRECT" in the last 72 hours. Thyroid Function Tests: No results for input(s): "TSH", "T4TOTAL", "FREET4", "T3FREE", "THYROIDAB" in the last 72 hours. Anemia Panel: No results for input(s): "VITAMINB12", "FOLATE", "FERRITIN", "TIBC", "IRON", "RETICCTPCT" in the last 72 hours. Urine analysis:    Component Value Date/Time   COLORURINE YELLOW 04/15/2021 2000   APPEARANCEUR CLEAR 04/15/2021 2000   LABSPEC 1.010 04/15/2021 2000   PHURINE 6.0 04/15/2021 2000   GLUCOSEU NEGATIVE 04/15/2021 2000   GLUCOSEU NEGATIVE 10/14/2011 1440   HGBUR TRACE (A) 04/15/2021 [place] 04/15/2021 2000   KETONESUR NEGATIVE 04/15/2021 2000   PROTEINUR 30 (A) 04/15/2021 2000   UROBILINOGEN 0.2 10/14/2011 1440   NITRITE POSITIVE (A) 04/15/2021 2000   LEUKOCYTESUR NEGATIVE 04/15/2021 2000   Sepsis Labs: _0 (procalcitonin:4,lacticidven:4) ) Recent Results (from the past 240 hour(s))  Resp panel by RT-PCR (RSV, Flu A&B, Covid) Anterior Nasal Swab     Status: None   Collection Time: 06/22/22 12:44 PM   Specimen: Anterior Nasal Swab  Result Value Ref Range Status   SARS Coronavirus 2 by RT PCR NEGATIVE NEGATIVE Final    Comment: (NOTE) SARS-CoV-2 target nucleic acids are NOT DETECTED.  The SARS-CoV-2 RNA is generally detectable in upper respiratory specimens during the acute phase of infection. The lowest concentration of SARS-CoV-2 viral copies this assay can detect is 138 copies/mL. A negative result does not preclude SARS-Cov-2 infection and should not be used as the sole basis for treatment or other patient management decisions. A negative result may  occur with  improper specimen collection/handling, submission of specimen other than nasopharyngeal swab, presence of viral mutation(s) within the areas targeted by this assay, and inadequate number of viral copies(<138 copies/mL). A negative result  must be combined with clinical observations, patient history, and epidemiological information. The expected result is Negative.  Fact Sheet for Patients:  EntrepreneurPulse.com.au  Fact Sheet for Healthcare Providers:  IncredibleEmployment.be  This test is no t yet approved or cleared by the [place] FDA and  has been authorized for detection and/or diagnosis of SARS-CoV-2 by FDA under an Emergency Use Authorization (EUA). This EUA will remain  in effect (meaning this test can be used) for the duration of the COVID-19 declaration under Section 564(b)(1) of the Act, 21 U.S.C.section 360bbb-3(b)(1), unless the authorization is terminated  or revoked sooner.       Influenza A by PCR NEGATIVE NEGATIVE Final   Influenza B by PCR NEGATIVE NEGATIVE Final    Comment: (NOTE) The Xpert Xpress SARS-CoV-2/FLU/RSV plus assay is intended as an aid in the diagnosis of influenza from Nasopharyngeal swab specimens and should not be used as a sole basis for treatment. Nasal washings and aspirates are unacceptable for Xpert Xpress SARS-CoV-2/FLU/RSV testing.  Fact Sheet for Patients: EntrepreneurPulse.com.au  Fact Sheet for Healthcare Providers: IncredibleEmployment.be  This test is not yet approved or cleared by the [place] FDA and has been authorized for detection and/or diagnosis of SARS-CoV-2 by FDA under an Emergency Use Authorization (EUA). This EUA will remain in effect (meaning this test can be used) for the duration of the COVID-19 declaration under Section 564(b)(1) of the Act, 21 U.S.C. section 360bbb-3(b)(1), unless the authorization is terminated or revoked.     Resp Syncytial Virus by PCR NEGATIVE NEGATIVE Final    Comment: (NOTE) Fact Sheet for Patients: EntrepreneurPulse.com.au  Fact Sheet for Healthcare Providers: IncredibleEmployment.be  This test is  not yet approved or cleared by the [place] FDA and has been authorized for detection and/or diagnosis of SARS-CoV-2 by FDA under an Emergency Use Authorization (EUA). This EUA will remain in effect (meaning this test can be used) for the duration of the COVID-19 declaration under Section 564(b)(1) of the Act, 21 U.S.C. section 360bbb-3(b)(1), unless the authorization is terminated or revoked.  Performed at [facility], [address] [place], [place] [address]      Radiological Exams on Admission: DG Chest 2 View  Result Date: 06/22/2022 CLINICAL DATA:  Cough and weakness. EXAM: CHEST - 2 VIEW COMPARISON:  06/23/2021. FINDINGS: Trachea is midline. Heart size stable. Thoracic aorta is calcified. Left lower lobe airspace opacification. Streaky atelectasis in the right middle lobe. Lungs are otherwise clear. No pleural fluid. IMPRESSION: Left lower lobe pneumonia. Followup PA and lateral chest X-ray is recommended in 3-4 weeks following trial of antibiotic therapy to ensure resolution and exclude underlying malignancy. Electronically Signed   By: Lorin Picket M.D.   On: 06/22/2022 13:02     Assessment/Plan   1. Pneumonia  - Started on [place] and azithromycin in ED for LLL pneumonia  - Check strep pneumo and legionella antigens, continue Rocephin and azithromycin, trend procalcitonin   2. Hypertension  - Continue Coreg, Norvasc, hydralazine, and valsartan-HCTZ    3. Type II DM  - A1c was 8.3% in October 2023  - Continue insulin    4. CKD IIIb  - SCr is 1.68 on admission, up from 1.43 in October 2023  - Renally-dose medications, monitor   5. CAD  -  No angina   - Continue ASA, Lipitor    DVT prophylaxis: sq heparin  Code Status: Full   Level of Care: Level of care: Med-Surg Family Communication: None present  Disposition Plan:  Patient is from: Home  Anticipated d/c is to: Home  Anticipated d/c date is: 06/24/22  Patient currently: Pending improved  respiratory status  Consults called: none  Admission status: Inpatient     Vianne Bulls, MD [facility]  06/22/2022, 7:18 PM

## 2022-06-22 NOTE — ED Triage Notes (Signed)
Patient presents to ED via POV from home. Here with chills and generalized body aches.

## 2022-06-22 NOTE — ED Notes (Signed)
Pt ambulated with RT on RA. Oxygen level remained between 93-95%.

## 2022-06-22 NOTE — ED Provider Notes (Addendum)
[facility] Provider Note   CSN: [id number] Arrival date & time: 06/22/22  1201     History  Chief Complaint  Patient presents with   Chills    [name] is a 71 y.o. female.  HPI Patient presents shortness of breath and cough.  Has had for around a week.  States generalized body aches.  No definite sick contacts.  Has felt more fatigued.  Has had a cough without sputum production.  No abdominal pain.   Past Medical History:  Diagnosis Date   Abscess in epidural space of L2-L5 lumbar spine 03/2006   Anemia, iron deficiency    Arthritis    Breast cancer ([place]) 06/12/2017   right breast   CAD (coronary artery disease), native coronary artery 03/30/2015   Cataract    Colon polyp    a. Multiple colonic polyps status post colonoscopy in October 2007, consistent with tubular adenoma, tubulovillous adenoma with no high-grade dysplasia or malignancy identified.    Constipation    Coronary artery disease    a. s/p CABG 06/2010: S-D2; S-PDA (at time of MV surgery).   Depression    Diabetes mellitus    type II   Family history of breast cancer    Family history of prostate cancer    Genetic testing 09/25/2017   Multi-Cancer panel (83 genes) @ [facility] - No pathogenic mutations detected   GERD (gastroesophageal reflux disease)    Hx of transient ischemic attack (TIA)    a. See stroke section.   Hyperlipidemia    Hypertension    Hypoxia    a. Has history of acute hypoxic respiratory failure in the setting of bronchitis/PNA or prior admissions.   MRSA infection    a. History of recurrent skin infection and soft tissue abscesses, with MRSA positive in the past.   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 mitral valve - s/p resection and MV repair 06/2010 Dr. [name].   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 Distal R MCA.    Stroke [facility])    a. 11/2009: mitral mass diagnosed at this time, also has distal R MCA stenosis, tx with coumadin. b. Readmitted 05/2010 with TIA symptoms - had not been taking Coumadin. s/p MV surgery 06/2010. Coumadin stopped 2013 after review of chart by Dr. [name] since mass was removed (stroke felt possibly related to this).     Home Medications Prior to Admission medications   Medication Sig Start Date End Date Taking? Authorizing Provider  Accu-Chek Softclix Lancets lancets Use as instructed 07/30/20   [name], MD  acetaminophen (TYLENOL) 325 MG tablet Take 1 tablet (325 mg total) by mouth every 6 (six) hours as needed. 01/12/22   Rising, Wells Guiles, PA-C  albuterol (VENTOLIN HFA) 108 (90 Base) MCG/ACT inhaler Inhale 2 puffs into the lungs every 6 (six) hours as needed for wheezing or shortness of breath. 07/29/20   [name], MD  amLODipine (NORVASC) 10 MG tablet Take 1 tablet (10 mg total) by mouth daily. 04/11/22   [name], MD  Ascorbic Acid (VITAMIN C PO) Take 500 mg by mouth daily.    [provider]  aspirin EC 81 MG tablet Take 1 tablet (81 mg total) by mouth daily. 06/26/14   Lance Bosch, NP  atorvastatin (LIPITOR) 80  MG tablet TAKE 1 TABLET EVERY DAY (DOSE INCREASE) 04/11/22   [name], MD  Blood Glucose Monitoring Suppl (ACCU-CHEK AVIVA PLUS) w/Device KIT USE AS DIRECTED 05/31/21   [name], MD  buPROPion (WELLBUTRIN XL) 150 MG 24 hr tablet Take 1 tablet (150 mg total) by mouth 2 (two) times daily. 07/19/21   [name], MD  carvedilol (COREG) 25 MG tablet Take 1 tablet (25 mg total) by mouth 2 (two) times daily. 11/23/21   Sueanne Margarita, MD  cholecalciferol (VITAMIN D3) 25 MCG (1000 UNIT) tablet Take 1,000 Units by mouth daily.    [provider]  Continuous Blood Gluc Receiver (DEXCOM G6 RECEIVER) DEVI 1 Device by Does not apply route daily. 09/06/21   [name], MD  Continuous Blood Gluc Receiver  (FREESTYLE LIBRE 14 DAY READER) DEVI UAD 04/11/22   [name], MD  Continuous Blood Gluc Sensor (FREESTYLE LIBRE SENSOR SYSTEM) MISC Change sensor Q 2 wks 04/11/22   [name], MD  diclofenac Sodium (VOLTAREN) 1 % GEL APPLY 2 GRAMS TOPICALLY 4 (FOUR) TIMES DAILY AS NEEDED. 01/20/20   [name], MD  DULoxetine (CYMBALTA) 30 MG capsule Take 1 capsule (30 mg total) by mouth daily. 10/05/21   Marcial Pacas, MD  ferrous sulfate (FEROSUL) 325 (65 FE) MG tablet TAKE 1 TABLET (325 MG TOTAL) BY MOUTH DAILY WITH BREAKFAST. 04/12/22   [name], MD  gabapentin (NEURONTIN) 100 MG capsule Take 1 capsule (100 mg total) by mouth 3 (three) times daily. 07/26/21   Marcial Pacas, MD  glucose blood (ACCU-CHEK AVIVA PLUS) test strip Use as instructed 07/30/20   [name], MD  hydrALAZINE (APRESOLINE) 100 MG tablet Take 1 tablet (100 mg total) by mouth 2 (two) times daily. 04/11/22   [name], MD  insulin glargine (LANTUS) 100 UNIT/ML Solostar Pen Inject 10 Units into the skin at bedtime. 04/11/22   [name], MD  Insulin Pen Needle (PEN NEEDLES) 31G X 8 MM MISC UAD 04/07/21   [name], MD  letrozole [facility]) 2.5 MG tablet TAKE 1 TABLET EVERY DAY (APPOINTMENT IS NEEDED FOR REFILLS) 11/23/21   Gardenia Phlegm, NP  metFORMIN (GLUCOPHAGE) 500 MG tablet Take 1 tablet (500 mg total) by mouth daily with breakfast. 09/15/21   [name], MD  methocarbamol (ROBAXIN) 500 MG tablet TAKE 1 TABLET EVERY 8 HOURS AS NEEDED FOR MUSCLE SPASMS. 10/07/21   [name], MD  pantoprazole (PROTONIX) 40 MG tablet TAKE 1 TABLET EVERY DAY 10/07/21   [name], MD  Semaglutide,0.25 or 0.5MG/DOS, (OZEMPIC, 0.25 OR 0.5 MG/DOSE,) 2 MG/3ML SOPN Inject 0.5 mg into the skin once a week. 06/07/22   [name], MD  traZODone (DESYREL) 50 MG tablet TAKE 1 TABLET AT BEDTIME AS NEEDED FOR SLEEP 08/27/21   [name], MD  valsartan-hydrochlorothiazide  (DIOVAN-HCT) 160-25 MG tablet Take 1 tablet by mouth daily. 04/11/22   [name], MD      Allergies    Patient has no known allergies.    Review of Systems   Review of Systems  Physical Exam Updated Vital Signs BP (!) 144/75   Pulse 95   Temp 99.5 F (37.5 C) (Oral)   Resp 18   Ht _0  (1.727 m)   Wt 109.8 kg   SpO2 96%   BMI 36.80 kg/m  Physical Exam Vitals and nursing note reviewed.  Eyes:  Pupils: Pupils are equal, round, and reactive to light.  Cardiovascular:     Rate and Rhythm: Regular rhythm.  Pulmonary:     Comments: Mildly harsh breath sounds without focal rales or rhonchi. Abdominal:     Tenderness: There is no abdominal tenderness.  Musculoskeletal:        General: No tenderness.     Right lower leg: No edema.     Left lower leg: No edema.  Skin:    General: Skin is warm.     Capillary Refill: Capillary refill takes less than 2 seconds.  Neurological:     Mental Status: She is alert and oriented to person, place, and time.     ED Results / Procedures / Treatments   Labs (all labs ordered are listed, but only abnormal results are displayed) Labs Reviewed  CBC WITH DIFFERENTIAL/PLATELET - Abnormal; Notable for the following components:      Result Value   WBC 20.3 (*)    Hemoglobin 10.3 (*)    HCT 31.8 (*)    MCV 76.1 (*)    MCH 24.6 (*)    RDW 17.0 (*)    Neutro Abs 16.7 (*)    Abs Immature Granulocytes 0.27 (*)    All other components within normal limits  RESP PANEL BY RT-PCR (RSV, FLU A&B, COVID)  [id number]  BASIC METABOLIC PANEL  LACTIC ACID, PLASMA  LACTIC ACID, PLASMA    EKG None  Radiology DG Chest 2 View  Result Date: 06/22/2022 CLINICAL DATA:  Cough and weakness. EXAM: CHEST - 2 VIEW COMPARISON:  06/23/2021. FINDINGS: Trachea is midline. Heart size stable. Thoracic aorta is calcified. Left lower lobe airspace opacification. Streaky atelectasis in the right middle lobe. Lungs are otherwise clear. No pleural fluid.  IMPRESSION: Left lower lobe pneumonia. Followup PA and lateral chest X-ray is recommended in 3-4 weeks following trial of antibiotic therapy to ensure resolution and exclude underlying malignancy. Electronically Signed   By: Lorin Picket M.D.   On: 06/22/2022 13:02    Procedures Procedures    Medications Ordered in ED Medications  cefTRIAXone (ROCEPHIN) 2 g in sodium chloride 0.9 % 100 mL IVPB (2 g Intravenous New Bag/Given 06/22/22 1505)  azithromycin (ZITHROMAX) 500 mg in sodium chloride 0.9 % 250 mL IVPB (500 mg Intravenous New Bag/Given 06/22/22 1504)    ED Course/ Medical Decision Making/ A&P                           Medical Decision Making Amount and/or Complexity of Data Reviewed Labs: ordered. Radiology: ordered.  Risk Decision regarding hospitalization.   Patient is shortness of breath and cough.  Has had for about a week now.  Has had chills.  More fatigue.  Differential diagnosis includes various respiratory infections.  Will check flu and COVID testing.  Chest x-ray done and independently interpreted shows left-sided pneumonia.  Will get basic blood work.  Will evaluate for need for admission.  Flu COVID and RSV negative.  Blood work pending.  Will give antibiotics since negative viral testing.   Care will be turned over to Dr. Tamera Punt.  White count is elevated at 20.  Saturations at rest will dip down to the 80s.  Will require admission to the hospital.  Will discuss with hospitalist.             Final Clinical Impression(s) / ED Diagnoses Final diagnoses:  Community acquired pneumonia, unspecified laterality    Rx /  DC Orders ED Discharge Orders     None         Davonna Belling, MD 06/22/22 [place], Chene Kasinger, MD 06/22/22 1537

## 2022-06-22 NOTE — ED Notes (Signed)
Patient transported to X-ray 

## 2022-06-22 NOTE — ED Notes (Signed)
Patient not wanting to change into a gown.

## 2022-06-23 ENCOUNTER — Inpatient Hospital Stay (HOSPITAL_COMMUNITY): Payer: Medicare HMO

## 2022-06-23 DIAGNOSIS — J189 Pneumonia, unspecified organism: Secondary | ICD-10-CM | POA: Diagnosis not present

## 2022-06-23 LAB — BASIC METABOLIC PANEL
Anion gap: 7 (ref 5–15)
BUN: 37 mg/dL — ABNORMAL HIGH (ref 8–23)
CO2: 23 mmol/L (ref 22–32)
Calcium: 8.2 mg/dL — ABNORMAL LOW (ref 8.9–10.3)
Chloride: 107 mmol/L (ref 98–111)
Creatinine, Ser: 1.67 mg/dL — ABNORMAL HIGH (ref 0.44–1.00)
GFR, Estimated: 32 mL/min — ABNORMAL LOW (ref >60)
Glucose, Bld: 132 mg/dL — ABNORMAL HIGH (ref 70–99)
Potassium: 5 mmol/L (ref 3.5–5.1)
Sodium: 137 mmol/L (ref 135–145)

## 2022-06-23 LAB — CBC
HCT: 28.1 % — ABNORMAL LOW (ref 36.0–46.0)
Hemoglobin: 8.6 g/dL — ABNORMAL LOW (ref 12.0–15.0)
MCH: 24.2 pg — ABNORMAL LOW (ref 26.0–34.0)
MCHC: 30.6 g/dL (ref 30.0–36.0)
MCV: 78.9 fL — ABNORMAL LOW (ref 80.0–100.0)
Platelets: 225 10*3/uL (ref 150–400)
RBC: 3.56 MIL/uL — ABNORMAL LOW (ref 3.87–5.11)
RDW: 17.1 % — ABNORMAL HIGH (ref 11.5–15.5)
WBC: 18 10*3/uL — ABNORMAL HIGH (ref 4.0–10.5)
nRBC: 0 % (ref 0.0–0.2)

## 2022-06-23 LAB — PROCALCITONIN: Procalcitonin: 26.12 ng/mL

## 2022-06-23 LAB — GLUCOSE, CAPILLARY
Glucose-Capillary: 92 mg/dL (ref 70–99)
Glucose-Capillary: 147 mg/dL — ABNORMAL HIGH (ref 70–99)
Glucose-Capillary: 178 mg/dL — ABNORMAL HIGH (ref 70–99)
Glucose-Capillary: 221 mg/dL — ABNORMAL HIGH (ref 70–99)

## 2022-06-23 LAB — MAGNESIUM: Magnesium: 2.3 mg/dL (ref 1.7–2.4)

## 2022-06-23 MED ORDER — ZOLPIDEM TARTRATE 5 MG PO TABS
5.0000 mg | ORAL_TABLET | Freq: Once | ORAL | Status: AC
  Administered 2022-06-23: 5 mg via ORAL
  Filled 2022-06-23: qty 1

## 2022-06-23 NOTE — Progress Notes (Signed)
Mobility Specialist - Progress Note   06/23/22 1209  Oxygen Therapy  O2 Device Nasal Cannula  O2 Flow Rate (L/min) 3 L/min  Mobility  Activity Ambulated with assistance in hallway;Ambulated independently in hallway  Level of Assistance Modified independent, requires aide device or extra time  Assistive Device None  Range of Motion/Exercises Active  Activity Response Tolerated well  Mobility Referral Yes  $Mobility charge 1 Mobility   Pt received in bed and agreeable to mobility. No complaints during ambulation. Pt did state she had some SOB but stated she was ok. Pt coughed up some red-ish mucus upon returning to bed. Nurse notified. Pt to bed after session with all needs met & call bell in reach.   Pre-mobility: 70 HR, 93% SpO2 During mobility: 75 HR, 100% SpO2 Post-mobility: 75 HR, 98% SPO2  Set designer

## 2022-06-23 NOTE — Progress Notes (Signed)
PROGRESS NOTE    [name]  ZDG:[id number] DOB: [birth year] DOA: 06/22/2022 PCP: [name], MD     Brief Narrative:  [name] is a pleasant 71 y.o. female with medical history significant for hypertension, type 2 diabetes mellitus, CAD, CKD 3B, and history of cancer of the right breast who presents to the emergency department with cough, shortness of breath, chills, and fatigue. She began feeling generally poor approximately 1 week ago with fatigue, aches, and cough.  She worsened acutely yesterday with severe chills, increased fatigue, increased shortness of breath, and worsening cough with sputum production. Chest x-ray concerning for left lower lobe pneumonia. Labs notable for negative COVID, influenza, and RSV PCR. She was admitted for CAP treatment.   New events last 24 hours / Subjective: Patient just got back from the bathroom, is on room air but is visibly tachypneic and fatigued appearing.  Complains of right shoulder pain, unable to lift her right arm greater than 90 degrees.  This has been ongoing for a while.  Assessment & Plan:   Principal Problem:   CAP (community acquired pneumonia) Active Problems:   Acute hypoxemic respiratory failure ([place])   Essential hypertension   Dyslipidemia   CAD (coronary artery disease), native coronary artery   Uncontrolled type 2 diabetes mellitus with hyperglycemia (HCC)   Stage 3b chronic kidney disease (CKD) (HCC)   Severe sepsis secondary to community-acquired pneumonia -Sepsis present on admission fever, tachycardia, leukocytosis, respiratory failure -Blood culture pending  -Rocephin, azithromycin  Acute hypoxemic respiratory failure -Desatted to 88% on room air.  Required 3 L nasal cannula O2, wean as able  Hypertension -Coreg, Norvasc, hydralazine, valsartan-HCTZ  Type 2 diabetes -A1c 8.3 -Semglee, sliding scale insulin  CKD stage IIIb -Baseline creatinine 1.4  CAD -Aspirin, Lipitor  Chronic  right shoulder pain -Right shoulder x-ray ordered  DVT prophylaxis:  heparin injection 5,000 Units Start: 06/22/22 2200  Code Status: Full Family Communication: None at bedside Disposition Plan:  Status is: Inpatient Remains inpatient appropriate because: IV antibiotics    Antimicrobials:  Anti-infectives (From admission, onward)    Start     Dose/Rate Route Frequency Ordered Stop   06/23/22 1500  cefTRIAXone (ROCEPHIN) 2 g in sodium chloride 0.9 % 100 mL IVPB        2 g 200 mL/hr over 30 Minutes Intravenous Every 24 hours 06/22/22 1918 06/27/22 1459   06/23/22 1000  azithromycin (ZITHROMAX) tablet 500 mg        500 mg Oral Daily 06/22/22 1918 06/27/22 0959   06/22/22 1500  cefTRIAXone (ROCEPHIN) 2 g in sodium chloride 0.9 % 100 mL IVPB        2 g 200 mL/hr over 30 Minutes Intravenous  Once 06/22/22 1445 06/22/22 1607   06/22/22 1500  azithromycin (ZITHROMAX) 500 mg in sodium chloride 0.9 % 250 mL IVPB        500 mg 250 mL/hr over 60 Minutes Intravenous  Once 06/22/22 1445 06/22/22 1618        Objective: Vitals:   06/22/22 2314 06/23/22 0225 06/23/22 0601 06/23/22 1325  BP: (!) 116/55 (!) 123/48 (!) 124/55 (!) 134/52  Pulse: 71 69 68 70  Resp: '17 17 17 '$ (!) 22  Temp: 98.3 F (36.8 C) 98 F (36.7 C) 98.3 F (36.8 C) 97.9 F (36.6 C)  TempSrc: Oral Oral Oral Oral  SpO2: 96% 100% 99% 100%  Weight:      Height:        Intake/Output Summary (  Last 24 hours) at 06/23/2022 1437 Last data filed at 06/23/2022 1328 Gross per 24 hour  Intake 1334.7 ml  Output 500 ml  Net 834.7 ml   Filed Weights   06/22/22 1244  Weight: 109.8 kg    Examination:  General exam: Appears calm and comfortable  Respiratory system: Coarse breath sounds bilaterally, tachypneic without accessory muscle use  Cardiovascular system: S1 & S2 heard, RRR. No murmurs. No pedal edema. Gastrointestinal system: Abdomen is nondistended, soft and nontender. Normal bowel sounds heard. Central nervous  system: Alert and oriented. No focal neurological deficits. Speech clear.  Extremities: Symmetric in appearance  Skin: No rashes, lesions or ulcers on exposed skin  Psychiatry: Judgement and insight appear normal. Mood & affect appropriate.   Data Reviewed: I have personally reviewed following labs and imaging studies  CBC: Recent Labs  Lab 06/22/22 1512 06/23/22 0425  WBC 20.3* 18.0*  NEUTROABS 16.7*  --   HGB 10.3* 8.6*  HCT 31.8* 28.1*  MCV 76.1* 78.9*  PLT 265 403   Basic Metabolic Panel: Recent Labs  Lab 06/22/22 1512 06/23/22 0425  NA 136 137  K 4.7 5.0  CL 104 107  CO2 22 23  GLUCOSE 145* 132*  BUN 33* 37*  CREATININE 1.68* 1.67*  CALCIUM 8.7* 8.2*  MG  --  2.3   GFR: Estimated Creatinine Clearance: 39 mL/min (A) (by C-G formula based on SCr of 1.67 mg/dL (H)). Liver Function Tests: No results for input(s): "AST", "ALT", "ALKPHOS", "BILITOT", "PROT", "ALBUMIN" in the last 168 hours. No results for input(s): "LIPASE", "AMYLASE" in the last 168 hours. No results for input(s): "AMMONIA" in the last 168 hours. Coagulation Profile: No results for input(s): "INR", "PROTIME" in the last 168 hours. Cardiac Enzymes: No results for input(s): "CKTOTAL", "CKMB", "CKMBINDEX", "TROPONINI" in the last 168 hours. BNP (last 3 results) No results for input(s): "PROBNP" in the last 8760 hours. HbA1C: No results for input(s): "HGBA1C" in the last 72 hours. CBG: Recent Labs  Lab 06/22/22 2104 06/22/22 2331 06/23/22 0740 06/23/22 1150  GLUCAP 178* 181* 92 147*   Lipid Profile: No results for input(s): "CHOL", "HDL", "LDLCALC", "TRIG", "CHOLHDL", "LDLDIRECT" in the last 72 hours. Thyroid Function Tests: No results for input(s): "TSH", "T4TOTAL", "FREET4", "T3FREE", "THYROIDAB" in the last 72 hours. Anemia Panel: No results for input(s): "VITAMINB12", "FOLATE", "FERRITIN", "TIBC", "IRON", "RETICCTPCT" in the last 72 hours. Sepsis Labs: Recent Labs  Lab 06/22/22 1512  06/22/22 1926 06/23/22 0425  PROCALCITON  --  27.39 26.12  LATICACIDVEN 1.5  --   --     Recent Results (from the past 240 hour(s))  Resp panel by RT-PCR (RSV, Flu A&B, Covid) Anterior Nasal Swab     Status: None   Collection Time: 06/22/22 12:44 PM   Specimen: Anterior Nasal Swab  Result Value Ref Range Status   SARS Coronavirus 2 by RT PCR NEGATIVE NEGATIVE Final    Comment: (NOTE) SARS-CoV-2 target nucleic acids are NOT DETECTED.  The SARS-CoV-2 RNA is generally detectable in upper respiratory specimens during the acute phase of infection. The lowest concentration of SARS-CoV-2 viral copies this assay can detect is 138 copies/mL. A negative result does not preclude SARS-Cov-2 infection and should not be used as the sole basis for treatment or other patient management decisions. A negative result may occur with  improper specimen collection/handling, submission of specimen other than nasopharyngeal swab, presence of viral mutation(s) within the areas targeted by this assay, and inadequate number of viral copies(<138 copies/mL). A  negative result must be combined with clinical observations, patient history, and epidemiological information. The expected result is Negative.  Fact Sheet for Patients:  EntrepreneurPulse.com.au  Fact Sheet for Healthcare Providers:  IncredibleEmployment.be  This test is no t yet approved or cleared by the [place] FDA and  has been authorized for detection and/or diagnosis of SARS-CoV-2 by FDA under an Emergency Use Authorization (EUA). This EUA will remain  in effect (meaning this test can be used) for the duration of the COVID-19 declaration under Section 564(b)(1) of the Act, 21 U.S.C.section 360bbb-3(b)(1), unless the authorization is terminated  or revoked sooner.       Influenza A by PCR NEGATIVE NEGATIVE Final   Influenza B by PCR NEGATIVE NEGATIVE Final    Comment: (NOTE) The Xpert Xpress  SARS-CoV-2/FLU/RSV plus assay is intended as an aid in the diagnosis of influenza from Nasopharyngeal swab specimens and should not be used as a sole basis for treatment. Nasal washings and aspirates are unacceptable for Xpert Xpress SARS-CoV-2/FLU/RSV testing.  Fact Sheet for Patients: EntrepreneurPulse.com.au  Fact Sheet for Healthcare Providers: IncredibleEmployment.be  This test is not yet approved or cleared by the [place] FDA and has been authorized for detection and/or diagnosis of SARS-CoV-2 by FDA under an Emergency Use Authorization (EUA). This EUA will remain in effect (meaning this test can be used) for the duration of the COVID-19 declaration under Section 564(b)(1) of the Act, 21 U.S.C. section 360bbb-3(b)(1), unless the authorization is terminated or revoked.     Resp Syncytial Virus by PCR NEGATIVE NEGATIVE Final    Comment: (NOTE) Fact Sheet for Patients: EntrepreneurPulse.com.au  Fact Sheet for Healthcare Providers: IncredibleEmployment.be  This test is not yet approved or cleared by the [place] FDA and has been authorized for detection and/or diagnosis of SARS-CoV-2 by FDA under an Emergency Use Authorization (EUA). This EUA will remain in effect (meaning this test can be used) for the duration of the COVID-19 declaration under Section 564(b)(1) of the Act, 21 U.S.C. section 360bbb-3(b)(1), unless the authorization is terminated or revoked.  Performed at [facility], [address] , [place] [address]       Radiology Studies: DG Shoulder Right  Result Date: 06/23/2022 CLINICAL DATA:  Right shoulder pain EXAM: RIGHT SHOULDER - 2+ VIEW COMPARISON:  None Available. FINDINGS: There is no evidence of acute fracture or dislocation. There is mild to moderate glenohumeral and AC joint osteoarthritis. Subacromial spurring. IMPRESSION: Mild to moderate  glenohumeral and AC joint osteoarthritis. Subacromial spurring. No acute fracture or dislocation. Electronically Signed   By: Maurine Simmering M.D.   On: 06/23/2022 11:27   DG Chest 2 View  Result Date: 06/22/2022 CLINICAL DATA:  Cough and weakness. EXAM: CHEST - 2 VIEW COMPARISON:  06/23/2021. FINDINGS: Trachea is midline. Heart size stable. Thoracic aorta is calcified. Left lower lobe airspace opacification. Streaky atelectasis in the right middle lobe. Lungs are otherwise clear. No pleural fluid. IMPRESSION: Left lower lobe pneumonia. Followup PA and lateral chest X-ray is recommended in 3-4 weeks following trial of antibiotic therapy to ensure resolution and exclude underlying malignancy. Electronically Signed   By: Lorin Picket M.D.   On: 06/22/2022 13:02      Scheduled Meds:  amLODipine  10 mg Oral Daily   aspirin EC  81 mg Oral Daily   atorvastatin  80 mg Oral Daily   azithromycin  500 mg Oral Daily   carvedilol  25 mg Oral BID WC   heparin  5,000 Units Subcutaneous Q8H   hydrALAZINE  100 mg Oral BID   irbesartan  150 mg Oral Daily   And   hydrochlorothiazide  25 mg Oral Daily   insulin aspart  0-5 Units Subcutaneous QHS   insulin aspart  0-6 Units Subcutaneous TID WC   insulin glargine-yfgn  6 Units Subcutaneous QHS   letrozole  2.5 mg Oral Daily   pantoprazole  40 mg Oral Daily   Continuous Infusions:  cefTRIAXone (ROCEPHIN)  IV       LOS: 1 day   Time spent: 25 minutes   Dessa Phi, DO [facility] 06/23/2022, 2:37 PM   Available via Epic secure chat 7am-7pm After these hours, please refer to coverage provider listed on [email or web address]

## 2022-06-23 NOTE — Progress Notes (Signed)
Mobility Specialist - Progress Note   06/23/22 1615  Mobility  Activity Ambulated with assistance in hallway  Level of Assistance Modified independent, requires aide device or extra time  Assistive Device  (IV Pole)  Distance Ambulated (ft) 250 ft  Activity Response Tolerated well  Mobility Referral Yes  $Mobility charge 1 Mobility   Pt received in bed and agreeable to mobility. No complaints during mobility just some SOB at EOS. Pt to bed after session with all needs met.    Pre-mobility:  69 HR, 96% SpO2 Post-mobility: 73 HR, 95% SPO2  Set designer

## 2022-06-23 NOTE — TOC CM/SW Note (Signed)
Transition of Care [facility]) Screening Note  Patient Details  Name: [name] Date of Birth: 1950-10-07  Transition of [facility]) CM/SW Contact:    Sherie Don, LCSW Phone Number: 06/23/2022, 3:06 PM  Transition of Care Department [facility]) has reviewed patient and no TOC needs have been identified at this time. We will continue to monitor patient advancement through interdisciplinary progression rounds. If new patient transition needs arise, please place a TOC consult.

## 2022-06-24 DIAGNOSIS — J189 Pneumonia, unspecified organism: Secondary | ICD-10-CM | POA: Diagnosis not present

## 2022-06-24 DIAGNOSIS — J9601 Acute respiratory failure with hypoxia: Secondary | ICD-10-CM

## 2022-06-24 DIAGNOSIS — E1165 Type 2 diabetes mellitus with hyperglycemia: Secondary | ICD-10-CM | POA: Diagnosis not present

## 2022-06-24 LAB — BASIC METABOLIC PANEL
Anion gap: 4 — ABNORMAL LOW (ref 5–15)
BUN: 37 mg/dL — ABNORMAL HIGH (ref 8–23)
CO2: 21 mmol/L — ABNORMAL LOW (ref 22–32)
Calcium: 8.2 mg/dL — ABNORMAL LOW (ref 8.9–10.3)
Chloride: 112 mmol/L — ABNORMAL HIGH (ref 98–111)
Creatinine, Ser: 1.78 mg/dL — ABNORMAL HIGH (ref 0.44–1.00)
GFR, Estimated: 30 mL/min — ABNORMAL LOW (ref >60)
Glucose, Bld: 124 mg/dL — ABNORMAL HIGH (ref 70–99)
Potassium: 5.1 mmol/L (ref 3.5–5.1)
Sodium: 137 mmol/L (ref 135–145)

## 2022-06-24 LAB — CBC
HCT: 28.7 % — ABNORMAL LOW (ref 36.0–46.0)
Hemoglobin: 8.6 g/dL — ABNORMAL LOW (ref 12.0–15.0)
MCH: 24.3 pg — ABNORMAL LOW (ref 26.0–34.0)
MCHC: 30 g/dL (ref 30.0–36.0)
MCV: 81.1 fL (ref 80.0–100.0)
Platelets: 240 10*3/uL (ref 150–400)
RBC: 3.54 MIL/uL — ABNORMAL LOW (ref 3.87–5.11)
RDW: 17.2 % — ABNORMAL HIGH (ref 11.5–15.5)
WBC: 12.8 10*3/uL — ABNORMAL HIGH (ref 4.0–10.5)
nRBC: 0 % (ref 0.0–0.2)

## 2022-06-24 LAB — GLUCOSE, CAPILLARY
Glucose-Capillary: 134 mg/dL — ABNORMAL HIGH (ref 70–99)
Glucose-Capillary: 212 mg/dL — ABNORMAL HIGH (ref 70–99)

## 2022-06-24 LAB — PROCALCITONIN: Procalcitonin: 17.89 ng/mL

## 2022-06-24 MED ORDER — GUAIFENESIN 100 MG/5ML PO LIQD: 5.0000 mL | ORAL | 0 refills | Status: DC | PRN

## 2022-06-24 MED ORDER — CEFDINIR 300 MG PO CAPS: 300.0000 mg | ORAL_CAPSULE | Freq: Two times a day (BID) | ORAL | 0 refills | Status: AC

## 2022-06-24 MED ORDER — AZITHROMYCIN 500 MG PO TABS: 500.0000 mg | ORAL_TABLET | Freq: Every day | ORAL | 0 refills | Status: AC

## 2022-06-24 MED ORDER — SODIUM CHLORIDE 0.9 % IV SOLN
2.0000 g | Freq: Once | INTRAVENOUS | Status: AC
  Administered 2022-06-24: 2 g via INTRAVENOUS
  Filled 2022-06-24: qty 20

## 2022-06-24 NOTE — Progress Notes (Signed)
Patient discharged to home w/ family. Given all belongings, instructions. Verbalized understanding of all instructions. Escorted to pov via w/c. 

## 2022-06-24 NOTE — Discharge Summary (Signed)
Physician Discharge Summary  [name] YQM:[id number] DOB: 12/01/1950 DOA: 06/22/2022  PCP: [name], MD  Admit date: 06/22/2022 Discharge date: 06/24/2022  Admitted From: Home Disposition: Home  Recommendations for Outpatient Follow-up:  Follow up with PCP in 1-2 weeks Please obtain BMP/CBC in one week   Home Health: N/A Equipment/Devices: N/A  Discharge Condition: Stable CODE STATUS: Full code Diet recommendation: Low-salt diet  Discharge summary: 71 year old female with history of hypertension, type 2 diabetes, coronary artery disease, CKD stage IIIb presented with about 1 week of cough, shortness of breath chills and fatigue.  She was negative for COVID-19, influenza and RSV.  Chest x-ray showed left lower lobe pneumonia.  Admitted and treated with IV antibiotics with Rocephin and azithromycin with good clinical response.  Initially on oxygen and now on room air.  Cultures negative.  Left lower lobe pneumonia with hypoxemia:  Severe sepsis secondary to community-acquired pneumonia present on admission.   Blood cultures negative.  Received 3 days of Rocephin and azithromycin. Initially 88% on room air and required 3 L nasal cannula oxygen, able to wean off to room air today. She will be discharged home, continue 5 more days of Omnicef and 3 more days of azithromycin to complete therapies. Mucolytic's and bronchodilators, chest physiotherapy at home.  She will continue all chronic home medications. Stable for discharge.   Discharge Diagnoses:  Principal Problem:   CAP (community acquired pneumonia) Active Problems:   Acute hypoxemic respiratory failure ([place])   Essential hypertension   Dyslipidemia   CAD (coronary artery disease), native coronary artery   Uncontrolled type 2 diabetes mellitus with hyperglycemia (HCC)   Stage 3b chronic kidney disease (CKD) ([place])    Discharge Instructions  Discharge Instructions     Call MD for:  difficulty  breathing, headache or visual disturbances   Complete by: As directed    Diet Carb Modified   Complete by: As directed    Increase activity slowly   Complete by: As directed       Allergies as of 06/24/2022   No Known Allergies      Medication List     STOP taking these medications    DULoxetine 30 MG capsule Commonly known as: Cymbalta       TAKE these medications    Accu-Chek Aviva Plus test strip Generic drug: glucose blood Use as instructed   Accu-Chek Aviva Plus w/Device Kit USE AS DIRECTED   Accu-Chek Softclix Lancets lancets Use as instructed   acetaminophen 325 MG tablet Commonly known as: Tylenol Take 1 tablet (325 mg total) by mouth every 6 (six) hours as needed. What changed: reasons to take this   albuterol 108 (90 Base) MCG/ACT inhaler Commonly known as: VENTOLIN HFA Inhale 2 puffs into the lungs every 6 (six) hours as needed for wheezing or shortness of breath.   amLODipine 10 MG tablet Commonly known as: NORVASC Take 1 tablet (10 mg total) by mouth daily.   aspirin EC 81 MG tablet Take 1 tablet (81 mg total) by mouth daily.   atorvastatin 80 MG tablet Commonly known as: LIPITOR TAKE 1 TABLET EVERY DAY (DOSE INCREASE) What changed:  how much to take how to take this when to take this additional instructions   azithromycin 500 MG tablet Commonly known as: ZITHROMAX Take 1 tablet (500 mg total) by mouth daily for 3 days.   buPROPion 150 MG 24 hr tablet Commonly known as: WELLBUTRIN XL Take 1 tablet (150 mg total) by mouth 2 (  two) times daily.   carvedilol 25 MG tablet Commonly known as: COREG Take 1 tablet (25 mg total) by mouth 2 (two) times daily.   cefdinir 300 MG capsule Commonly known as: OMNICEF Take 1 capsule (300 mg total) by mouth 2 (two) times daily for 5 days.   cholecalciferol 25 MCG (1000 UNIT) tablet Commonly known as: VITAMIN D3 Take 1,000 Units by mouth daily.   Dexcom G6 Receiver Devi 1 Device by Does not  apply route daily.   FreeStyle Libre 14 Day Reader Kerrin Mo UAD   diclofenac Sodium 1 % Gel Commonly known as: VOLTAREN APPLY 2 GRAMS TOPICALLY 4 (FOUR) TIMES DAILY AS NEEDED. What changed:  how much to take how to take this when to take this reasons to take this additional instructions   ferrous sulfate 325 (65 FE) MG tablet Commonly known as: FeroSul TAKE 1 TABLET (325 MG TOTAL) BY MOUTH DAILY WITH BREAKFAST. What changed:  how much to take how to take this when to take this additional instructions   FreeStyle Libre Sensor System Misc Change sensor Q 2 wks   gabapentin 100 MG capsule Commonly known as: Neurontin Take 1 capsule (100 mg total) by mouth 3 (three) times daily.   guaiFENesin 100 MG/5ML liquid Commonly known as: ROBITUSSIN Take 5 mLs by mouth every 4 (four) hours as needed for cough or to loosen phlegm.   hydrALAZINE 100 MG tablet Commonly known as: APRESOLINE Take 1 tablet (100 mg total) by mouth 2 (two) times daily.   insulin glargine 100 UNIT/ML Solostar Pen Commonly known as: LANTUS Inject 10 Units into the skin at bedtime.   letrozole 2.5 MG tablet Commonly known as: FEMARA TAKE 1 TABLET EVERY DAY (APPOINTMENT IS NEEDED FOR REFILLS) What changed: See the new instructions.   metFORMIN 500 MG tablet Commonly known as: GLUCOPHAGE Take 1 tablet (500 mg total) by mouth daily with breakfast.   methocarbamol 500 MG tablet Commonly known as: ROBAXIN TAKE 1 TABLET EVERY 8 HOURS AS NEEDED FOR MUSCLE SPASMS. What changed: See the new instructions.   Ozempic (0.25 or 0.5 MG/DOSE) 2 MG/3ML Sopn Generic drug: Semaglutide(0.25 or 0.5MG/DOS) Inject 0.5 mg into the skin once a week.   pantoprazole 40 MG tablet Commonly known as: PROTONIX TAKE 1 TABLET EVERY DAY   Pen Needles 31G X 8 MM Misc UAD   traZODone 50 MG tablet Commonly known as: DESYREL TAKE 1 TABLET AT BEDTIME AS NEEDED FOR SLEEP What changed:  reasons to take this additional  instructions   valsartan-hydrochlorothiazide 160-25 MG tablet Commonly known as: DIOVAN-HCT Take 1 tablet by mouth daily.   VITAMIN C PO Take 500 mg by mouth daily.        No Known Allergies  Consultations: None   Procedures/Studies: DG Shoulder Right  Result Date: 06/23/2022 CLINICAL DATA:  Right shoulder pain EXAM: RIGHT SHOULDER - 2+ VIEW COMPARISON:  None Available. FINDINGS: There is no evidence of acute fracture or dislocation. There is mild to moderate glenohumeral and AC joint osteoarthritis. Subacromial spurring. IMPRESSION: Mild to moderate glenohumeral and AC joint osteoarthritis. Subacromial spurring. No acute fracture or dislocation. Electronically Signed   By: Maurine Simmering M.D.   On: 06/23/2022 11:27   DG Chest 2 View  Result Date: 06/22/2022 CLINICAL DATA:  Cough and weakness. EXAM: CHEST - 2 VIEW COMPARISON:  06/23/2021. FINDINGS: Trachea is midline. Heart size stable. Thoracic aorta is calcified. Left lower lobe airspace opacification. Streaky atelectasis in the right middle lobe. Lungs are otherwise clear.  No pleural fluid. IMPRESSION: Left lower lobe pneumonia. Followup PA and lateral chest X-ray is recommended in 3-4 weeks following trial of antibiotic therapy to ensure resolution and exclude underlying malignancy. Electronically Signed   By: Lorin Picket M.D.   On: 06/22/2022 13:02   (Echo, Carotid, EGD, Colonoscopy, ERCP)    Subjective: Patient seen and examined.  Coughing with occasional yellowish sputum.  Afebrile.  On room air.  Mobilize around in the hallway without shortness of breath.  Remains afebrile since admission.   Discharge Exam: Vitals:   06/23/22 2148 06/24/22 0627  BP: (!) 144/60 (!) 162/74  Pulse: 64 (!) 58  Resp: 17 17  Temp: 97.7 F (36.5 C) 98 F (36.7 C)  SpO2: 99% 100%   Vitals:   06/23/22 0601 06/23/22 1325 06/23/22 2148 06/24/22 0627  BP: (!) 124/55 (!) 134/52 (!) 144/60 (!) 162/74  Pulse: 68 70 64 (!) 58  Resp: 17 (!)  _0 Temp: 98.3 F (36.8 C) 97.9 F (36.6 C) 97.7 F (36.5 C) 98 F (36.7 C)  TempSrc: Oral Oral Oral Oral  SpO2: 99% 100% 99% 100%  Weight:      Height:        General: Pt is alert, awake, not in acute distress Cardiovascular: RRR, S1/S2 +, no rubs, no gallops Respiratory: CTA bilaterally, no wheezing, no rhonchi Abdominal: Soft, NT, ND, bowel sounds + Extremities: no edema, no cyanosis    The results of significant diagnostics from this hospitalization (including imaging, microbiology, ancillary and laboratory) are listed below for reference.     Microbiology: Recent Results (from the past 240 hour(s))  Resp panel by RT-PCR (RSV, Flu A&B, Covid) Anterior Nasal Swab     Status: None   Collection Time: 06/22/22 12:44 PM   Specimen: Anterior Nasal Swab  Result Value Ref Range Status   SARS Coronavirus 2 by RT PCR NEGATIVE NEGATIVE Final    Comment: (NOTE) SARS-CoV-2 target nucleic acids are NOT DETECTED.  The SARS-CoV-2 RNA is generally detectable in upper respiratory specimens during the acute phase of infection. The lowest concentration of SARS-CoV-2 viral copies this assay can detect is 138 copies/mL. A negative result does not preclude SARS-Cov-2 infection and should not be used as the sole basis for treatment or other patient management decisions. A negative result may occur with  improper specimen collection/handling, submission of specimen other than nasopharyngeal swab, presence of viral mutation(s) within the areas targeted by this assay, and inadequate number of viral copies(<138 copies/mL). A negative result must be combined with clinical observations, patient history, and epidemiological information. The expected result is Negative.  Fact Sheet for Patients:  EntrepreneurPulse.com.au  Fact Sheet for Healthcare Providers:  IncredibleEmployment.be  This test is no t yet approved or cleared by the [place] FDA  and  has been authorized for detection and/or diagnosis of SARS-CoV-2 by FDA under an Emergency Use Authorization (EUA). This EUA will remain  in effect (meaning this test can be used) for the duration of the COVID-19 declaration under Section 564(b)(1) of the Act, 21 U.S.C.section 360bbb-3(b)(1), unless the authorization is terminated  or revoked sooner.       Influenza A by PCR NEGATIVE NEGATIVE Final   Influenza B by PCR NEGATIVE NEGATIVE Final    Comment: (NOTE) The Xpert Xpress SARS-CoV-2/FLU/RSV plus assay is intended as an aid in the diagnosis of influenza from Nasopharyngeal swab specimens and should not be used as a sole basis for treatment. Nasal washings and aspirates are  unacceptable for Xpert Xpress SARS-CoV-2/FLU/RSV testing.  Fact Sheet for Patients: EntrepreneurPulse.com.au  Fact Sheet for Healthcare Providers: IncredibleEmployment.be  This test is not yet approved or cleared by the [place] FDA and has been authorized for detection and/or diagnosis of SARS-CoV-2 by FDA under an Emergency Use Authorization (EUA). This EUA will remain in effect (meaning this test can be used) for the duration of the COVID-19 declaration under Section 564(b)(1) of the Act, 21 U.S.C. section 360bbb-3(b)(1), unless the authorization is terminated or revoked.     Resp Syncytial Virus by PCR NEGATIVE NEGATIVE Final    Comment: (NOTE) Fact Sheet for Patients: EntrepreneurPulse.com.au  Fact Sheet for Healthcare Providers: IncredibleEmployment.be  This test is not yet approved or cleared by the [place] FDA and has been authorized for detection and/or diagnosis of SARS-CoV-2 by FDA under an Emergency Use Authorization (EUA). This EUA will remain in effect (meaning this test can be used) for the duration of the COVID-19 declaration under Section 564(b)(1) of the Act, 21 U.S.C. section 360bbb-3(b)(1),  unless the authorization is terminated or revoked.  Performed at [facility], [address] [place], [place] [address]   Culture, blood (Routine X 2) w Reflex to ID Panel     Status: None (Preliminary result)   Collection Time: 06/23/22  3:28 PM   Specimen: BLOOD RIGHT ARM  Result Value Ref Range Status   Specimen Description   Final    BLOOD RIGHT ARM Performed at [facility], [place] [address] [place], [place] [address]    Special Requests   Final    BOTTLES DRAWN AEROBIC ONLY Blood Culture adequate volume Performed at [facility] [address], [place], [place] [address]    Culture   Final    NO GROWTH < 12 HOURS Performed at [facility] [address] [place], [place] [address]    Report Status PENDING  Incomplete  Culture, blood (Routine X 2) w Reflex to ID Panel     Status: None (Preliminary result)   Collection Time: 06/23/22  3:39 PM   Specimen: BLOOD  Result Value Ref Range Status   Specimen Description   Final    BLOOD SITE NOT SPECIFIED Performed at [facility] [address], [place], [place] [address]    Special Requests   Final    BOTTLES DRAWN AEROBIC ONLY Blood Culture adequate volume Performed at [facility] [address], [place], [place] [address]    Culture   Final    NO GROWTH < 12 HOURS Performed at [facility] [address] [place],  [address]    Report Status PENDING  Incomplete     Labs: BNP (last 3 results) No results for input(s): "BNP" in the last 8760 hours. Basic Metabolic Panel: Recent Labs  Lab 06/22/22 1512 06/23/22 0425 06/24/22 0356  NA 136 137 137  K 4.7 5.0 5.1  CL 104 107 112*  CO2 22 23 21*  GLUCOSE 145* 132* 124*  BUN 33* 37* 37*  CREATININE 1.68* 1.67* 1.78*  CALCIUM 8.7* 8.2* 8.2*  MG  --  2.3  --    Liver Function Tests: No results for input(s): "AST", "ALT", "ALKPHOS", "BILITOT", "PROT", "ALBUMIN" in the  last 168 hours. No results for input(s): "LIPASE", "AMYLASE" in the last 168 hours. No results for input(s): "AMMONIA" in the last 168 hours. CBC: Recent Labs  Lab 06/22/22 1512 06/23/22 0425 06/24/22 0356  WBC 20.3* 18.0* 12.8*  NEUTROABS 16.7*  --   --   HGB 10.3* 8.6* 8.6*  HCT 31.8* 28.1* 28.7*  MCV 76.1* 78.9* 81.1  PLT 265 225 240   Cardiac Enzymes: No results for input(s): "CKTOTAL", "CKMB", "CKMBINDEX", "TROPONINI" in the last 168 hours. BNP: Invalid input(s): "POCBNP" CBG: Recent Labs  Lab 06/23/22 0740 06/23/22 1150 06/23/22 1646 06/23/22 2150 06/24/22 0746  GLUCAP 92 147* 178* 221* 134*   D-Dimer No results for input(s): "DDIMER" in the last 72 hours. Hgb A1c No results for input(s): "HGBA1C" in the last 72 hours. Lipid Profile No results for input(s): "CHOL", "HDL", "LDLCALC", "TRIG", "CHOLHDL", "LDLDIRECT" in the last 72 hours. Thyroid function studies No results for input(s): "TSH", "T4TOTAL", "T3FREE", "THYROIDAB" in the last 72 hours.  Invalid input(s): "FREET3" Anemia work up No results for input(s): "VITAMINB12", "FOLATE", "FERRITIN", "TIBC", "IRON", "RETICCTPCT" in the last 72 hours. Urinalysis    Component Value Date/Time   COLORURINE YELLOW 04/15/2021 2000   APPEARANCEUR CLEAR 04/15/2021 2000   LABSPEC 1.010 04/15/2021 2000   PHURINE 6.0 04/15/2021 2000   GLUCOSEU NEGATIVE 04/15/2021 2000   GLUCOSEU NEGATIVE 10/14/2011 1440   HGBUR TRACE (A) 04/15/2021 2000   BILIRUBINUR NEGATIVE 04/15/2021 2000   KETONESUR NEGATIVE 04/15/2021 2000   PROTEINUR 30 (A) 04/15/2021 2000   UROBILINOGEN 0.2 10/14/2011 1440   NITRITE POSITIVE (A) 04/15/2021 2000   LEUKOCYTESUR NEGATIVE 04/15/2021 2000   Sepsis Labs Recent Labs  Lab 06/22/22 1512 06/23/22 0425 06/24/22 0356  WBC 20.3* 18.0* 12.8*   Microbiology Recent Results (from the past 240 hour(s))  Resp panel by RT-PCR (RSV, Flu A&B, Covid) Anterior Nasal Swab     Status: None   Collection Time:  06/22/22 12:44 PM   Specimen: Anterior Nasal Swab  Result Value Ref Range Status   SARS Coronavirus 2 by RT PCR NEGATIVE NEGATIVE Final    Comment: (NOTE) SARS-CoV-2 target nucleic acids are NOT DETECTED.  The SARS-CoV-2 RNA is generally detectable in upper respiratory specimens during the acute phase of infection. The lowest concentration of SARS-CoV-2 viral copies this assay can detect is 138 copies/mL. A negative result does not preclude SARS-Cov-2 infection and should not be used as the sole basis for treatment or other patient management decisions. A negative result may occur with  improper specimen collection/handling, submission of specimen other than nasopharyngeal swab, presence of viral mutation(s) within the areas targeted by this assay, and inadequate number of viral copies(<138 copies/mL). A negative result must be combined with clinical observations, patient history, and epidemiological information. The expected result is Negative.  Fact Sheet for Patients:  EntrepreneurPulse.com.au  Fact Sheet for Healthcare Providers:  IncredibleEmployment.be  This test is no t yet approved or cleared by the [place] FDA and  has been authorized for detection and/or diagnosis of SARS-CoV-2 by FDA under an Emergency Use Authorization (EUA). This EUA will remain  in effect (meaning this test can be used) for the duration of the COVID-19 declaration under Section 564(b)(1) of the Act, 21 U.S.C.section 360bbb-3(b)(1), unless the authorization is terminated  or revoked sooner.       Influenza A by PCR NEGATIVE NEGATIVE Final   Influenza B by PCR NEGATIVE NEGATIVE Final    Comment: (NOTE) The Xpert Xpress SARS-CoV-2/FLU/RSV plus assay is intended as an aid in the diagnosis of influenza from Nasopharyngeal swab specimens and should not be used as a sole basis for treatment. Nasal washings and aspirates are unacceptable for Xpert Xpress  SARS-CoV-2/FLU/RSV testing.  Fact Sheet for Patients: EntrepreneurPulse.com.au  Fact Sheet for Healthcare Providers: IncredibleEmployment.be  This test is not yet approved or cleared by the [place] FDA and has been authorized for detection and/or diagnosis of SARS-CoV-2 by FDA under an Emergency Use Authorization (EUA). This EUA will remain in effect (meaning this test can be used) for the duration of the COVID-19 declaration under Section 564(b)(1) of the Act, 21 U.S.C. section 360bbb-3(b)(1), unless the authorization is terminated or revoked.     Resp Syncytial Virus by PCR NEGATIVE NEGATIVE Final    Comment: (NOTE) Fact Sheet for Patients: EntrepreneurPulse.com.au  Fact Sheet for Healthcare Providers: IncredibleEmployment.be  This test is not yet approved or cleared by the [place] FDA and has been authorized for detection and/or diagnosis of SARS-CoV-2 by FDA under an Emergency Use Authorization (EUA). This EUA will remain in effect (meaning this test can be used) for the duration of the COVID-19 declaration under Section 564(b)(1) of the Act, 21 U.S.C. section 360bbb-3(b)(1), unless the authorization is terminated or revoked.  Performed at [facility], [address] [place], [place] [address]   Culture, blood (Routine X 2) w Reflex to ID Panel     Status: None (Preliminary result)   Collection Time: 06/23/22  3:28 PM   Specimen: BLOOD RIGHT ARM  Result Value Ref Range Status   Specimen Description   Final    BLOOD RIGHT ARM Performed at [facility], [place] [address] [place], [place] [address]    Special Requests   Final    BOTTLES DRAWN AEROBIC ONLY Blood Culture adequate volume Performed at [facility] [address], [place], [place] [address]    Culture   Final    NO GROWTH < 12 HOURS Performed at [facility] [address],  [place], [place] [address]    Report Status PENDING  Incomplete  Culture, blood (Routine X 2) w Reflex to ID Panel     Status: None (Preliminary result)   Collection Time: 06/23/22  3:39 PM   Specimen: BLOOD  Result Value Ref Range Status   Specimen Description   Final    BLOOD SITE NOT SPECIFIED Performed at [facility] [address], [place], [place] [address]    Special Requests   Final    BOTTLES DRAWN AEROBIC ONLY Blood Culture adequate volume Performed at [facility] [address] [place], [place] [address]    Culture   Final    NO GROWTH < 12 HOURS Performed at [facility] [address] [place], [place] [address]    Report Status PENDING  Incomplete     Time coordinating discharge: 32 minutes  SIGNED:   Barb Merino, MD  [facility] 06/24/2022, 10:45 AM

## 2022-06-28 ENCOUNTER — Telehealth: Payer: Self-pay

## 2022-06-28 LAB — CULTURE, BLOOD (ROUTINE X 2)
Culture: NO GROWTH
Culture: NO GROWTH
Special Requests: ADEQUATE
Special Requests: ADEQUATE

## 2022-06-28 NOTE — Patient Outreach (Signed)
  Care Coordination TOC Note Transition Care Management Follow-up Telephone Call Date of discharge and from where: 06/24/22-[facility]   Dx; "CAP" How have you been since you were released from the hospital? Patient reports she is doing "pretty good." She still has some congestion at times and phlegm gets "caught in throat." Encouraged increased fluid intake and robitussin to help with sx mgmt. Patient states she forgot to pick up med from pharmacy but will have family go get it today and start taking it. She denies any SOB. She voices appetite is "coming back." No issues with elimination. Overall, she feels like she is getting better. Any questions or concerns? No  Items Reviewed: Did the pt receive and understand the discharge instructions provided? Yes  Medications obtained and verified? Yes  Other? Yes -resp mgmt, s/s of infection Any new allergies since your discharge? No  Dietary orders reviewed? Yes Do you have support at home? Yes -spouse and son  Home Gardens and Equipment/Supplies: Were home health services ordered? not applicable If so, what is the name of the agency? N/A  Has the agency set up a time to come to the patient's home? not applicable Were any new equipment or medical supplies ordered?  No What is the name of the medical supply agency? N/A Were you able to get the supplies/equipment? not applicable Do you have any questions related to the use of the equipment or supplies? No  Functional Questionnaire: (I = Independent and D = Dependent) ADLs: I  Bathing/Dressing- I  Meal Prep- I  Eating- I  Maintaining continence- I  Transferring/Ambulation- I  Managing Meds- I  Follow up appointments reviewed:  PCP Hospital f/u appt confirmed? Yes  Patient already has appt with Dr. Dion Body on 07/11/22. She did not want to change appt and be seen any sooner. [facility] f/u appt confirmed?  N/A  . Are transportation arrangements needed? No  If their  condition worsens, is the pt aware to call PCP or go to the Emergency Dept.? Yes Was the patient provided with contact information for the PCP's office or ED? Yes Was to pt encouraged to call back with questions or concerns? Yes  SDOH assessments and interventions completed:   Yes SDOH Interventions Today    Flowsheet Row Most Recent Value  SDOH Interventions   Food Insecurity Interventions Intervention Not Indicated  Transportation Interventions Intervention Not Indicated       Care Coordination Interventions:  Education provided    Encounter Outcome:  Pt. Visit Completed    Enzo Montgomery, RN,BSN,CCM [facility] Management Telephonic Care Management Coordinator Direct Phone: [phone] Toll Free: [phone] Fax: [phone]

## 2022-06-30 ENCOUNTER — Other Ambulatory Visit: Payer: Self-pay | Admitting: Internal Medicine

## 2022-07-11 ENCOUNTER — Ambulatory Visit: Payer: Medicare PPO | Attending: Internal Medicine | Admitting: Pharmacist

## 2022-07-11 ENCOUNTER — Other Ambulatory Visit: Payer: Self-pay | Admitting: Pharmacist

## 2022-07-11 DIAGNOSIS — E1169 Type 2 diabetes mellitus with other specified complication: Secondary | ICD-10-CM | POA: Diagnosis not present

## 2022-07-11 LAB — POCT GLYCOSYLATED HEMOGLOBIN (HGB A1C): HbA1c, POC (controlled diabetic range): 7.1 % — AB (ref 0.0–7.0)

## 2022-07-11 MED ORDER — OZEMPIC (0.25 OR 0.5 MG/DOSE) 2 MG/3ML ~~LOC~~ SOPN: 0.2500 mg | PEN_INJECTOR | SUBCUTANEOUS | 2 refills | Status: DC

## 2022-07-11 NOTE — Progress Notes (Signed)
S:    No chief complaint on file.  72 y.o. female who presents for diabetes evaluation, education, and management.  PMH is significant for HTN, DM, HL, depression, chronic back pain, RT breast CA (intraductal CA, ER/PR+, lumpectomy and XRT - completed 08/2017), CKD stage 3, CVA 2011, CAD s/p CABG and MVR/resection of fibroelastoma, and IDA. Patient was referred and last seen by Primary Care Provider, Dr. Wynetta Emery, on 04/11/2022.   Last seen by pharmacy clinic on 06/07/2022 where Ozempic was increased to 0.5 mg weekly and Novolog 4 units BID was discontinued.  Today, patient arrives in good spirits and presents with the assistance of a cane. She reports while she was moving, she misplaced her Ozempic pen and she has not had a dose in ~1 month. She states insurance will pay for a refill at the end of January. She otherwise tolerated Ozempic 0.5 mg weekly without issue. She has not checked her blood sugar since being discharged from the hospital on 06/24/22 for pneumonia. A1c down to 7.1% today, from 8.3% in October 2023.  Current diabetes medications include: Lantus 12 units at night, metformin 500 mg daily, Ozempic 0.5 mg weekly (not taking as she lost this medication; insurance will pay for refill at the end of January).    Patient reports adherence to taking all medications as prescribed.   Insurance coverage: [employer]  Patient denies hypoglycemic events.  Reported home fasting blood sugars: hasn't check since being discharged from the hospital on 06/24/2022.   Reported dietary habits:  -Pt is doing better with salt, hot sauce.  -Limiting sweets. Craving for sweets has gone down. Will have a Snickers Bar once every now and then.   Patient reports nocturia (nighttime urination). At least 2x/night Patient denies neuropathy (nerve pain). Patient denies visual changes. Patient reports self foot exams.   O:  Lab Results  Component Value Date   HGBA1C 7.1 (A) 07/11/2022    Lipid Panel     Component Value Date/Time   CHOL 184 09/14/2021 1115   TRIG 44 09/14/2021 1115   HDL 106 09/14/2021 1115   CHOLHDL 1.7 09/14/2021 1115   CHOLHDL 2.8 04/28/2016 1028   VLDL 19 04/28/2016 1028   LDLCALC 69 09/14/2021 1115   LDLDIRECT 101.7 07/18/2011 0901    Clinical Atherosclerotic Cardiovascular Disease (ASCVD): Yes    A/P: Diabetes longstanding currently close to goal based on A1c (7.1%). Patient is able to verbalize appropriate hypoglycemia management plan. Medication adherence reported; however, patient has not been on Ozempic for ~1 month due to her losing this medication. -Continued Lantus 12 units daily as she is not able to restart Ozempic until the end of January when insurance will pay for a refill. Once Ozemipc is titrated, can consider discontinuing insulin given A1c close to goal.  -Restart Ozempic 0.25 mg weekly.  -Can consider SGLT2i given concurrent CKD. Patient previously unable to afford Jardiance. Could consider starting Farxiga at PCP follow up as [facility] has a higher income cutt off.  -Discontinued metformin 500 mg daily as eGFR=30 on 06/24/2022.  -Extensively discussed pathophysiology of diabetes, recommended lifestyle interventions, dietary effects on blood sugar control.  -Counseled on s/sx of and management of hypoglycemia.  -Next A1c anticipated 3-6 months.   Written patient instructions provided. Patient verbalized understanding of treatment plan.  Total time in face to face counseling 30 minutes.    Follow-up:  Pharmacist: PRN. PCP clinic visit on 08/09/2022.   Joseph Art, Pharm.D. PGY-2 Ambulatory Care Pharmacy Resident  07/11/2022 3:43 PM

## 2022-07-12 ENCOUNTER — Other Ambulatory Visit: Payer: Self-pay | Admitting: Internal Medicine

## 2022-07-12 ENCOUNTER — Ambulatory Visit: Payer: Self-pay | Admitting: *Deleted

## 2022-07-12 DIAGNOSIS — I152 Hypertension secondary to endocrine disorders: Secondary | ICD-10-CM

## 2022-07-12 DIAGNOSIS — Z760 Encounter for issue of repeat prescription: Secondary | ICD-10-CM

## 2022-07-12 MED ORDER — ALBUTEROL SULFATE HFA 108 (90 BASE) MCG/ACT IN AERS: 2.0000 | INHALATION_SPRAY | Freq: Four times a day (QID) | RESPIRATORY_TRACT | 0 refills | Status: DC | PRN

## 2022-07-12 NOTE — Telephone Encounter (Signed)
Future visit in 4 weeks.  Requested Prescriptions  Pending Prescriptions Disp Refills   valsartan-hydrochlorothiazide (DIOVAN-HCT) 160-25 MG tablet [Pharmacy Med Name: VALSARTAN/HYDROCHLOROTHIAZIDE 160-25 MG Tablet] 90 tablet 2    Sig: TAKE 1 TABLET EVERY DAY     Cardiovascular: ARB + Diuretic Combos Failed - 07/12/2022 12:09 PM      Failed - Cr in normal range and within 180 days    Creatinine  Date Value Ref Range Status  12/31/2020 267.1 20.0 - 300.0 mg/dL Final   Creat  Date Value Ref Range Status  04/28/2016 1.17 (H) 0.50 - 0.99 mg/dL Final    Comment:      For patients > or = 74 years of age: The upper reference limit for Creatinine is approximately 13% higher for people identified as African-American.      Creatinine, Ser  Date Value Ref Range Status  06/24/2022 1.78 (H) 0.44 - 1.00 mg/dL Final   Creatinine,U  Date Value Ref Range Status  06/05/2010  mg/dL Final   159.6 (NOTE)  Cutoff Values for Urine Drug Screen:        Drug Class           Cutoff (ng/mL)        Amphetamines            1000        Barbiturates             200        Cocaine Metabolites      300        Benzodiazepines          200        Methadone                 300        Opiates                 2000        Phencyclidine             25        Propoxyphene             300        Marijuana Metabolites     50  For medical purposes only.         Failed - Last BP in normal range    BP Readings from Last 1 Encounters:  06/24/22 (!) 162/74         Passed - K in normal range and within 180 days    Potassium  Date Value Ref Range Status  06/24/2022 5.1 3.5 - 5.1 mmol/L Final         Passed - Na in normal range and within 180 days    Sodium  Date Value Ref Range Status  06/24/2022 137 135 - 145 mmol/L Final  04/11/2022 142 134 - 144 mmol/L Final         Passed - eGFR is 10 or above and within 180 days    GFR, Est African American  Date Value Ref Range Status  06/23/2015 58 (L) >=60 mL/min Final    GFR calc Af Amer  Date Value Ref Range Status  05/15/2020 52 (L) >59 mL/min/1.73 Final    Comment:    **In accordance with recommendations from the NKF-ASN Task force,**   [facility] is in the process of updating its eGFR calculation to the   2021 CKD-EPI creatinine equation that estimates kidney function   without  a race variable.    GFR, Est Non African American  Date Value Ref Range Status  06/23/2015 50 (L) >=60 mL/min Final    Comment:      The estimated GFR is a calculation valid for adults (>=72 years old) that uses the CKD-EPI algorithm to adjust for age and sex. It is   not to be used for children, pregnant women, hospitalized patients,    patients on dialysis, or with rapidly changing kidney function. According to the NKDEP, eGFR >89 is normal, 60-89 shows mild impairment, 30-59 shows moderate impairment, 15-29 shows severe impairment and <15 is ESRD.      GFR, Estimated  Date Value Ref Range Status  06/24/2022 30 (L) >60 mL/min Final    Comment:    (NOTE) Calculated using the CKD-EPI Creatinine Equation (2021)    GFR  Date Value Ref Range Status  07/18/2011 69.03 >60.00 mL/min Final   eGFR  Date Value Ref Range Status  04/11/2022 39 (L) >59 mL/min/1.73 Final         Passed - Patient is not pregnant      Passed - Valid encounter within last 6 months    Recent Outpatient Visits           Yesterday Type 2 diabetes mellitus with morbid obesity ([place])   [place], Annie Main L, RPH-CPP   1 month ago Type 2 diabetes mellitus with morbid obesity [facility])   [place], Annie Main L, RPH-CPP   2 months ago Type 2 diabetes mellitus with morbid obesity [facility])   [place], RPH-CPP   2 months ago Encounter for [employer] annual wellness exam   [place], Neoma Laming B, MD   3 months ago Type 2  diabetes mellitus with morbid obesity [facility])   [place], MD       Future Appointments             In 4 weeks [name], MD [place]             amLODipine (NORVASC) 10 MG tablet [Pharmacy Med Name: AMLODIPINE BESYLATE 10 MG Tablet] 90 tablet 2    Sig: TAKE 1 TABLET EVERY DAY     Cardiovascular: Calcium Channel Blockers 2 Failed - 07/12/2022 12:09 PM      Failed - Last BP in normal range    BP Readings from Last 1 Encounters:  06/24/22 (!) 162/74         Passed - Last Heart Rate in normal range    Pulse Readings from Last 1 Encounters:  06/24/22 (!) 24         Passed - Valid encounter within last 6 months    Recent Outpatient Visits           Yesterday Type 2 diabetes mellitus with morbid obesity ([place])   [place], McCool L, RPH-CPP   1 month ago Type 2 diabetes mellitus with morbid obesity [facility])   [place], Annie Main L, RPH-CPP   2 months ago Type 2 diabetes mellitus with morbid obesity [facility])   [place], RPH-CPP   2 months ago Encounter for [employer] annual wellness exam   [facility] [name], MD   3 months ago Type 2 diabetes mellitus with morbid obesity [facility])   [place], MD       Future Appointments             In 4 weeks Wynetta Emery Dalbert Batman, MD [place]

## 2022-07-12 NOTE — Telephone Encounter (Signed)
Requested Prescriptions  Pending Prescriptions Disp Refills   albuterol (VENTOLIN HFA) 108 (90 Base) MCG/ACT inhaler 1 each 0    Sig: Inhale 2 puffs into the lungs every 6 (six) hours as needed for wheezing or shortness of breath.     Pulmonology:  Beta Agonists 2 Failed - 07/12/2022 11:48 AM      Failed - Last BP in normal range    BP Readings from Last 1 Encounters:  06/24/22 (!) 162/74         Passed - Last Heart Rate in normal range    Pulse Readings from Last 1 Encounters:  06/24/22 (!) 53         Passed - Valid encounter within last 12 months    Recent Outpatient Visits           Yesterday Type 2 diabetes mellitus with morbid obesity ([place])   [place], Annie Main L, RPH-CPP   1 month ago Type 2 diabetes mellitus with morbid obesity [facility])   [place], Jarome Matin, RPH-CPP   2 months ago Type 2 diabetes mellitus with morbid obesity [facility])   [place], Jarome Matin, RPH-CPP   2 months ago Encounter for [employer] annual wellness exam   [place] Karle Plumber B, MD   3 months ago Type 2 diabetes mellitus with morbid obesity [facility])   [place], Deborah B, MD       Future Appointments             In 4 weeks Wynetta Emery Dalbert Batman, MD [place]

## 2022-07-12 NOTE — Telephone Encounter (Signed)
Medication Refill - Medication: albuterol (VENTOLIN HFA) 108 (90 Base) MCG/ACT inhaler   Pt stated the pharmacy advised her Rx is needed to be re-certified by Dr.Johnson.  Has the patient contacted their pharmacy? Yes.    (Agent: If yes, when and what did the pharmacy advise?)  Preferred Pharmacy (with phone number or street name):  [place], [address]  [place] [address]  Phone: [phone] Fax: [phone]  Hours: Not open 24 hours   Has the patient been seen for an appointment in the last year OR does the patient have an upcoming appointment? Yes.    Agent: Please be advised that RX refills may take up to 3 business days. We ask that you follow-up with your pharmacy.

## 2022-07-12 NOTE — Telephone Encounter (Signed)
  Chief Complaint: chest congestion Symptoms: cough- chest congestion, "rattling in chest" Frequency: 12/27- has had antibiotic treatment and breathing treatment- still has residual cough- congestion in chest Pertinent Negatives: Patient denies SOB, fever Disposition: '[]'$ ED /'[]'$ Urgent Care (no appt availability in office) / '[]'$ Appointment(In office/virtual)/ '[]'$  [place] [facility]/ '[]'$ Home Care/ '[x]'$ Refused Recommended Disposition /'[]'$ [place] [facility]/ '[]'$  Follow-up with PCP Additional Notes: Patient advised UC- no open appointment in office- patient states he car is not working and she needs office visit- she is in [employer] now. Patient advised must be seen if she gets worse in any way- will send message to office to see if she can be scheduled this week   Reason for Disposition  [1] Longstanding difficulty breathing AND [2] not responding to usual therapy  Answer Assessment - Initial Assessment Questions 1. RESPIRATORY STATUS: "Describe your breathing?" (e.g., wheezing, shortness of breath, unable to speak, severe coughing)      Chest congestion, coughing phlegm  2. ONSET: "When did this breathing problem begin?" , rattling in chest     12/27- diagnosed pneumonia  3. PATTERN "Does the difficult breathing come and go, or has it been constant since it started?"      Comes an goes- cough makes worse 4. SEVERITY: "How bad is your breathing?" (e.g., mild, moderate, severe)    - MILD: No SOB at rest, mild SOB with walking, speaks normally in sentences, can lie down, no retractions, pulse < 100.    - MODERATE: SOB at rest, SOB with minimal exertion and prefers to sit, cannot lie down flat, speaks in phrases, mild retractions, audible wheezing, pulse 100-120.    - SEVERE: Very SOB at rest, speaks in single words, struggling to breathe, sitting hunched forward, retractions, pulse > 120      Using inhaler- no SOB, "rattling" In chest 5. RECURRENT SYMPTOM: "Have you had difficulty breathing  before?" If Yes, ask: "When was the last time?" and "What happened that time?"      no 6. CARDIAC HISTORY: "Do you have any history of heart disease?" (e.g., heart attack, angina, bypass surgery, angioplasty)      Yes- bypass 7. LUNG HISTORY: "Do you have any history of lung disease?"  (e.g., pulmonary embolus, asthma, emphysema)     COPD 8. CAUSE: "What do you think is causing the breathing problem?"      Residual congestion from pneumonia  9. OTHER SYMPTOMS: "Do you have any other symptoms? (e.g., dizziness, runny nose, cough, chest pain, fever)     Cough, head ache  Protocols used: Breathing Difficulty-A-AH

## 2022-07-12 NOTE — Telephone Encounter (Signed)
Patient scheduled for telephone visit  with PCP

## 2022-07-14 ENCOUNTER — Telehealth: Payer: Self-pay

## 2022-07-14 NOTE — Telephone Encounter (Signed)
Call placed to patient.  Patient given information to call mychart support with help enrolling. Patient voiced she was calling to in roll in [place] before visit on 07/05/2022. Informed patient she could call our office if you have any question for further assistance.

## 2022-07-15 ENCOUNTER — Telehealth (HOSPITAL_BASED_OUTPATIENT_CLINIC_OR_DEPARTMENT_OTHER): Payer: Medicare PPO | Admitting: Internal Medicine

## 2022-07-15 DIAGNOSIS — J189 Pneumonia, unspecified organism: Secondary | ICD-10-CM

## 2022-07-15 NOTE — Progress Notes (Signed)
Virtual Visit via Video Note  I connected with [name] on 07/15/2022 at 8:13 AM by a video enabled telemedicine application and verified that I am speaking with the correct person using two identifiers.  Location: Patient: home Provider: Office   I discussed the limitations of evaluation and management by telemedicine and the availability of in person appointments. The patient expressed understanding and agreed to proceed.  History of Present Illness: Pt with hx of HTN, DM, HL, depression, chronic back pain, RT breast CA (intraductal CA, ER/PR+, lumpectomy and XRT - completed 08/2017), CKD stage 3, CVA 2011 (residual weakness on LT side), CAD s/p CABG and MVR/resection of fibroelastoma, IDA.    Patient was hospitalized the end of last month for 2 days with left lower lobe community-acquired pneumonia.  Appropriately treated with antibiotics and was discharged on 5 additional days of Omnicef and 3 days of azithromycin.  Patient reports she is feeling better.  She completed the antibiotics.  She was bringing up green blood-tinged sputum prior to hospitalization.  Now sputum is gray.  She has lingering wet cough which she feels she is unable to bring up the mucus.  No fever.  No shortness of breath.  Some resolving congestion.  Outpatient Encounter Medications as of 07/15/2022  Medication Sig Note   Accu-Chek Softclix Lancets lancets Use as instructed    acetaminophen (TYLENOL) 325 MG tablet Take 1 tablet (325 mg total) by mouth every 6 (six) hours as needed. (Patient taking differently: Take 325 mg by mouth every 6 (six) hours as needed for mild pain.)    albuterol (VENTOLIN HFA) 108 (90 Base) MCG/ACT inhaler Inhale 2 puffs into the lungs every 6 (six) hours as needed for wheezing or shortness of breath.    amLODipine (NORVASC) 10 MG tablet TAKE 1 TABLET EVERY DAY    Ascorbic Acid (VITAMIN C PO) Take 500 mg by mouth daily.    aspirin EC 81 MG tablet Take 1 tablet (81 mg total) by mouth  daily.    atorvastatin (LIPITOR) 80 MG tablet TAKE 1 TABLET EVERY DAY (DOSE INCREASE) (Patient taking differently: Take 80 mg by mouth daily.)    Blood Glucose Monitoring Suppl (ACCU-CHEK AVIVA PLUS) w/Device KIT USE AS DIRECTED    buPROPion (WELLBUTRIN XL) 150 MG 24 hr tablet Take 1 tablet (150 mg total) by mouth 2 (two) times daily.    carvedilol (COREG) 25 MG tablet Take 1 tablet (25 mg total) by mouth 2 (two) times daily.    cholecalciferol (VITAMIN D3) 25 MCG (1000 UNIT) tablet Take 1,000 Units by mouth daily.    Continuous Blood Gluc Receiver (DEXCOM G6 RECEIVER) DEVI 1 Device by Does not apply route daily.    Continuous Blood Gluc Receiver (FREESTYLE LIBRE 14 DAY READER) DEVI UAD    Continuous Blood Gluc Sensor (FREESTYLE LIBRE SENSOR SYSTEM) MISC Change sensor Q 2 wks    diclofenac Sodium (VOLTAREN) 1 % GEL APPLY 2 GRAMS TOPICALLY 4 (FOUR) TIMES DAILY AS NEEDED. (Patient taking differently: Apply 2 g topically daily as needed (For pain).)    ferrous sulfate (FEROSUL) 325 (65 FE) MG tablet TAKE 1 TABLET (325 MG TOTAL) BY MOUTH DAILY WITH BREAKFAST. (Patient taking differently: Take 325 mg by mouth daily with breakfast.)    gabapentin (NEURONTIN) 100 MG capsule Take 1 capsule (100 mg total) by mouth 3 (three) times daily. 06/22/2022: Patient states she still taking this medication    glucose blood (ACCU-CHEK AVIVA PLUS) test strip Use as instructed  guaiFENesin (ROBITUSSIN) 100 MG/5ML liquid Take 5 mLs by mouth every 4 (four) hours as needed for cough or to loosen phlegm.    hydrALAZINE (APRESOLINE) 100 MG tablet Take 1 tablet (100 mg total) by mouth 2 (two) times daily.    insulin glargine (LANTUS) 100 UNIT/ML Solostar Pen Inject 10 Units into the skin at bedtime.    Insulin Pen Needle (PEN NEEDLES) 31G X 8 MM MISC UAD    letrozole (FEMARA) 2.5 MG tablet TAKE 1 TABLET EVERY DAY (APPOINTMENT IS NEEDED FOR REFILLS) (Patient taking differently: Take 2.5 mg by mouth daily.)    methocarbamol  (ROBAXIN) 500 MG tablet TAKE 1 TABLET EVERY 8 HOURS AS NEEDED FOR MUSCLE SPASMS. (Patient taking differently: Take 500 mg by mouth every 8 (eight) hours as needed for muscle spasms.)    pantoprazole (PROTONIX) 40 MG tablet TAKE 1 TABLET EVERY DAY (Patient taking differently: Take 40 mg by mouth daily.)    Semaglutide,0.25 or 0.'5MG'$ /DOS, (OZEMPIC, 0.25 OR 0.5 MG/DOSE,) 2 MG/3ML SOPN Inject 0.25 mg into the skin once a week.    traZODone (DESYREL) 50 MG tablet TAKE 1 TABLET AT BEDTIME AS NEEDED FOR SLEEP    valsartan-hydrochlorothiazide (DIOVAN-HCT) 160-25 MG tablet TAKE 1 TABLET EVERY DAY    No facility-administered encounter medications on file as of 07/15/2022.      Observations/Objective: Patient sitting in chair in no acute distress.  She is able to talk in complete sentences.  Assessment and Plan: 1. Community acquired pneumonia of left lower lobe of lung Resolving left lower lobe pneumonia.  She has completed the antibiotics.  Clinically doing better.  I recommend over-the-counter Mucinex to help break up the mucus in her chest.  She will keep her follow-up appointment with me next month.   Follow Up Instructions: Next month as already scheduled.   I discussed the assessment and treatment plan with the patient. The patient was provided an opportunity to ask questions and all were answered. The patient agreed with the plan and demonstrated an understanding of the instructions.   The patient was advised to call back or seek an in-person evaluation if the symptoms worsen or if the condition fails to improve as anticipated.  I spent 10 minutes dedicated to the care of this patient on the date of this encounter to include previsit review of hospital discharge, face-to-face time with patient discussing symptoms, diagnosis and management.  This note has been created with Surveyor, quantity. Any transcriptional errors are unintentional.  Karle Plumber, MD

## 2022-08-04 ENCOUNTER — Other Ambulatory Visit: Payer: Self-pay | Admitting: Internal Medicine

## 2022-08-04 DIAGNOSIS — F32A Depression, unspecified: Secondary | ICD-10-CM

## 2022-08-09 ENCOUNTER — Ambulatory Visit: Payer: Medicare PPO | Attending: Internal Medicine | Admitting: Internal Medicine

## 2022-08-09 ENCOUNTER — Encounter: Payer: Self-pay | Admitting: Internal Medicine

## 2022-08-09 ENCOUNTER — Other Ambulatory Visit: Payer: Self-pay | Admitting: Internal Medicine

## 2022-08-09 DIAGNOSIS — N1832 Chronic kidney disease, stage 3b: Secondary | ICD-10-CM | POA: Diagnosis not present

## 2022-08-09 DIAGNOSIS — E1169 Type 2 diabetes mellitus with other specified complication: Secondary | ICD-10-CM

## 2022-08-09 DIAGNOSIS — C50411 Malignant neoplasm of upper-outer quadrant of right female breast: Secondary | ICD-10-CM | POA: Diagnosis not present

## 2022-08-09 DIAGNOSIS — E1159 Type 2 diabetes mellitus with other circulatory complications: Secondary | ICD-10-CM | POA: Diagnosis not present

## 2022-08-09 DIAGNOSIS — Z1231 Encounter for screening mammogram for malignant neoplasm of breast: Secondary | ICD-10-CM

## 2022-08-09 DIAGNOSIS — Z170 Estrogen receptor positive status [ER+]: Secondary | ICD-10-CM

## 2022-08-09 DIAGNOSIS — D509 Iron deficiency anemia, unspecified: Secondary | ICD-10-CM | POA: Diagnosis not present

## 2022-08-09 DIAGNOSIS — M19011 Primary osteoarthritis, right shoulder: Secondary | ICD-10-CM | POA: Diagnosis not present

## 2022-08-09 DIAGNOSIS — Z0289 Encounter for other administrative examinations: Secondary | ICD-10-CM

## 2022-08-09 DIAGNOSIS — E669 Obesity, unspecified: Secondary | ICD-10-CM

## 2022-08-09 DIAGNOSIS — Z853 Personal history of malignant neoplasm of breast: Secondary | ICD-10-CM

## 2022-08-09 DIAGNOSIS — I152 Hypertension secondary to endocrine disorders: Secondary | ICD-10-CM

## 2022-08-09 MED ORDER — TRAMADOL HCL 50 MG PO TABS: 50.0000 mg | ORAL_TABLET | Freq: Two times a day (BID) | ORAL | 0 refills | Status: DC | PRN

## 2022-08-09 MED ORDER — POLYETHYLENE GLYCOL 3350 17 GM/SCOOP PO POWD: 17.0000 g | Freq: Every day | ORAL | 1 refills | Status: DC | PRN

## 2022-08-09 NOTE — Patient Instructions (Addendum)
Please call [facility] to schedule colonoscopy PH# [phone]

## 2022-08-09 NOTE — Telephone Encounter (Signed)
Medication Refill - Medication: Gabapentin  100 mg capsules,  Hydralazine 100 mg tabs,  Diclofenac gel 1 %  Has the patient contacted their pharmacy? Yes.   (Agent: If no, request that the patient contact the pharmacy for the refill. If patient does not wish to contact the pharmacy document the reason why and proceed with request.) (Agent: If yes, when and what did the pharmacy advise?)  Preferred Pharmacy (with phone number or street name): [facility] Has the patient been seen for an appointment in the last year OR does the patient have an upcoming appointment? Yes.    Agent: Please be advised that RX refills may take up to 3 business days. We ask that you follow-up with your pharmacy.

## 2022-08-09 NOTE — Progress Notes (Unsigned)
Patient ID: [name], female    DOB: [birth year]  MRN: [id number]  CC: Diabetes (DM f/u. Janene Harvey in R shoulder & back, waking up at night due to pain X2 mo. Gabapentin not helping pain. Marye Round X2 weeks/Already received flu vax.)   Subjective: [name] is a 72 y.o. female who presents for chronic ds management Her concerns today include:  Pt with hx of HTN, DM, HL, depression, chronic back pain, RT breast CA (intraductal CA, ER/PR+, lumpectomy and XRT - completed 08/2017), CKD stage 3, CVA 2011 (residual weakness on LT side), CAD s/p CABG and MVR/resection of fibroelastoma, IDA.    Does not have meds with her.  DM: Lab Results  Component Value Date   HGBA1C 7.1 (A) 07/11/2022  On last visit with me, we started her on Ozempic to help with better diabetes control and weight loss.  Currently on Ozempic 0.25 mg once a week.  Started on 0.25 mg weekly on last visit and was subsequently increased to the 0.5 mg after several weeks.  After being on that dose for about 3 weeks, she misplaced her medication so went back to taking the 0.25 mg dose.  Recently filled the prescription for the 0.5 mg but has not started that as yet.   Tolerating the med okay without vomiting, diarrhea, abdominal pain.  Has decreased appetite.  Not eating a lot of white starches Metformin discontinued due to GFR in the 30s. Novolog was d/c. Continues with Lantus insulin 10 units daily. Reports her pharmacy never called her with [facility].  Rxn recently sent. Checks BS 3 x/wk; highest was 124.  Lowest 87.  HTN/CAD: Should be on carvedilol 25 mg twice a day, Norvasc 10 mg daily, hydralazine 100 mg twice a day, valsartan/HCTZ 160 mg / 25 mg daily, and Lipitor 80 mg.  Confirms taking. Took meds already this a.m Checks BP once a wk with SBP 160s/70s Trying to limit salt; admits she likes salt  CKD 3b: GFR has remained in the 30s. Has new appt  with nephrologist 08/2022.  Not on NSAIDs.  Anemia: has IDA  with worsening H/H from 10.3/31.8 to 8.6/28 with MCV 78 Taking iron daily Referred to [facility] on last visit 4 months ago.  History of adenomatous colon polyps and is due for repeat C-scope.  She was called but had not called them back as yet. +constipation - stools hard  C/o pain RT shoulder, wakes her at night for past few months, has been using Biofreeze and Bengay without relief.  Gabapentin does not help.  X-ray of shoulder done 06/23/2022 revealed mild to moderate GH and AC jt OA Not using any street drugs.    Hx of RT breast CA:  has f/u appt with oncology next mth.  Still on Femara  Patient Active Problem List   Diagnosis Date Noted   CAP (community acquired pneumonia) 06/22/2022   Stage 3b chronic kidney disease (CKD) ([place]) 06/22/2022   Trigger finger, right ring finger 12/02/2021   DM type 2 with diabetic peripheral neuropathy ([place]) 07/26/2021   Bilateral low back pain with bilateral sciatica 07/26/2021   Gait abnormality 07/26/2021   Controlled substance agreement signed 12/31/2020   Shoulder stiffness, left 07/07/2020   Status post lumbar spine surgery for decompression of spinal cord 06/09/2020   Lumbar stenosis 05/27/2020   Spinal stenosis of lumbar region 05/21/2019   Complete tear of left rotator cuff 05/16/2019   Uncontrolled type 2 diabetes mellitus with hyperglycemia ([place]) 12/19/2018  Microalbuminuria 04/08/2018   Genetic testing 09/25/2017   Family history of breast cancer    Family history of prostate cancer    Malignant neoplasm of upper-outer quadrant of right breast in female, estrogen receptor positive ([place]) 06/26/2017   Lumbar herniated disc 04/15/2015   Abnormal nuclear stress test 04/03/2015   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   CAD (coronary artery disease), native coronary artery 03/30/2015   S/P mitral valve repair 11/16/2010   Dyslipidemia 06/29/2010   Diabetes ([place]) 09/29/2006   ANEMIA-IRON DEFICIENCY 09/29/2006   Essential  hypertension 09/29/2006     Current Outpatient Medications on File Prior to Visit  Medication Sig Dispense Refill   Accu-Chek Softclix Lancets lancets Use as instructed 100 each 12   acetaminophen (TYLENOL) 325 MG tablet Take 1 tablet (325 mg total) by mouth every 6 (six) hours as needed. (Patient taking differently: Take 325 mg by mouth every 6 (six) hours as needed for mild pain.) 30 tablet 2   albuterol (VENTOLIN HFA) 108 (90 Base) MCG/ACT inhaler Inhale 2 puffs into the lungs every 6 (six) hours as needed for wheezing or shortness of breath. 1 each 0   amLODipine (NORVASC) 10 MG tablet TAKE 1 TABLET EVERY DAY 90 tablet 2   Ascorbic Acid (VITAMIN C PO) Take 500 mg by mouth daily.     aspirin EC 81 MG tablet Take 1 tablet (81 mg total) by mouth daily. 30 tablet 5   atorvastatin (LIPITOR) 80 MG tablet TAKE 1 TABLET EVERY DAY (DOSE INCREASE) (Patient taking differently: Take 80 mg by mouth daily.) 90 tablet 2   Blood Glucose Monitoring Suppl (ACCU-CHEK AVIVA PLUS) w/Device KIT USE AS DIRECTED 1 kit 0   buPROPion (WELLBUTRIN XL) 150 MG 24 hr tablet TAKE 1 TABLET TWICE DAILY 180 tablet 1   carvedilol (COREG) 25 MG tablet Take 1 tablet (25 mg total) by mouth 2 (two) times daily. 180 tablet 3   cholecalciferol (VITAMIN D3) 25 MCG (1000 UNIT) tablet Take 1,000 Units by mouth daily.     diclofenac Sodium (VOLTAREN) 1 % GEL APPLY 2 GRAMS TOPICALLY 4 (FOUR) TIMES DAILY AS NEEDED. (Patient taking differently: Apply 2 g topically daily as needed (For pain).) 100 g 0   ferrous sulfate (FEROSUL) 325 (65 FE) MG tablet TAKE 1 TABLET (325 MG TOTAL) BY MOUTH DAILY WITH BREAKFAST. (Patient taking differently: Take 325 mg by mouth daily with breakfast.) 90 tablet 1   gabapentin (NEURONTIN) 100 MG capsule Take 1 capsule (100 mg total) by mouth 3 (three) times daily. 90 capsule 11   glucose blood (ACCU-CHEK AVIVA PLUS) test strip Use as instructed 100 each 12   hydrALAZINE (APRESOLINE) 100 MG tablet Take 1 tablet  (100 mg total) by mouth 2 (two) times daily. 180 tablet 2   insulin glargine (LANTUS) 100 UNIT/ML Solostar Pen Inject 10 Units into the skin at bedtime. 15 mL 3   Insulin Pen Needle (PEN NEEDLES) 31G X 8 MM MISC UAD 100 each 6   letrozole (FEMARA) 2.5 MG tablet TAKE 1 TABLET EVERY DAY (APPOINTMENT IS NEEDED FOR REFILLS) (Patient taking differently: Take 2.5 mg by mouth daily.) 90 tablet 3   methocarbamol (ROBAXIN) 500 MG tablet TAKE 1 TABLET EVERY 8 HOURS AS NEEDED FOR MUSCLE SPASMS. (Patient taking differently: Take 500 mg by mouth every 8 (eight) hours as needed for muscle spasms.) 60 tablet 1   pantoprazole (PROTONIX) 40 MG tablet TAKE 1 TABLET EVERY DAY (Patient taking differently: Take 40 mg by mouth daily.)  90 tablet 1   Semaglutide,0.25 or 0.5MG/DOS, (OZEMPIC, 0.25 OR 0.5 MG/DOSE,) 2 MG/3ML SOPN Inject 0.25 mg into the skin once a week. 3 mL 2   traZODone (DESYREL) 50 MG tablet TAKE 1 TABLET AT BEDTIME AS NEEDED FOR SLEEP 60 tablet 0   valsartan-hydrochlorothiazide (DIOVAN-HCT) 160-25 MG tablet TAKE 1 TABLET EVERY DAY 90 tablet 2   Continuous Blood Gluc Receiver (FREESTYLE LIBRE 14 DAY READER) DEVI UAD (Patient not taking: Reported on 08/09/2022) 1 each 0   Continuous Blood Gluc Sensor (FREESTYLE LIBRE SENSOR SYSTEM) MISC Change sensor Q 2 wks (Patient not taking: Reported on 08/09/2022) 2 each 12   No current facility-administered medications on file prior to visit.    No Known Allergies  Social History   Socioeconomic History   Marital status: Married    Spouse name: Not on file   Number of children: 3   Years of education: some college   Highest education level: Not on file  Occupational History    Employer: UNEMPLOYED  Tobacco Use   Smoking status: Former    Types: Cigarettes    Quit date: 10/14/2010    Years since quitting: 11.8   Smokeless tobacco: Never  Vaping Use   Vaping Use: Never used  Substance and Sexual Activity   Alcohol use: Yes    Comment: social   Drug  use: No   Sexual activity: Not Currently  Other Topics Concern   Not on file  Social History Narrative   Lives with husband, stay at home, uses cane occasionally, still active/ambulatory.    Left-handed.   No daily caffeine use.   Social Determinants of Health   Financial Resource Strain: Not on file  Food Insecurity: No Food Insecurity (06/28/2022)   Hunger Vital Sign    Worried About Running Out of Food in the Last Year: Never true    Ran Out of Food in the Last Year: Never true  Transportation Needs: No Transportation Needs (06/28/2022)   PRAPARE - Hydrologist (Medical): No    Lack of Transportation (Non-Medical): No  Physical Activity: Not on file  Stress: Not on file  Social Connections: Not on file  Intimate Partner Violence: Not At Risk (06/22/2022)   Humiliation, Afraid, Rape, and Kick questionnaire    Fear of Current or Ex-Partner: No    Emotionally Abused: No    Physically Abused: No    Sexually Abused: No    Family History  Problem Relation Age of Onset   Colon cancer Mother        dx 59s; deceased 26   Irritable bowel syndrome Mother    Prostate cancer Father        deceased 105s   Cirrhosis Sister        died of GI bleed associated with cirrhosis of the liver   Stroke Brother    Prostate cancer Brother 71       deceased 67   Diabetes Brother        x3   Kidney disease Brother    Cancer Brother 12       unk. type   Breast cancer Cousin        several maternal 1st and 2nd cousins with breast cancer    Past Surgical History:  Procedure Laterality Date   BREAST BIOPSY Right 08/26/2019   benign   BREAST LUMPECTOMY Right    BREAST LUMPECTOMY WITH RADIOACTIVE SEED AND SENTINEL LYMPH NODE BIOPSY Right 07/20/2017  Procedure: RIGHT BREAST LUMPECTOMY WITH RADIOACTIVE SEED AND SENTINEL LYMPH NODE BIOPSY;  Surgeon: Alphonsa Overall, MD;  Location: [facility];  Service: General;  Laterality: Right;   CARDIAC CATHETERIZATION  04/03/2015    Procedure: Left Heart Cath and Cors/Grafts Angiography;  Surgeon: Peter M Martinique, MD;  Location: [facility];  Service: Cardiovascular;;   CORONARY ARTERY BYPASS GRAFT  1/12   history of ankle fractures requiring surgery     LUMBAR LAMINECTOMY/DECOMPRESSION MICRODISCECTOMY Left 04/15/2015   Procedure: LUMBAR LAMINECTOMY/DECOMPRESSION MICRODISCECTOMY 1 LEVEL;  Surgeon: Newman Pies, MD;  Location: [facility];  Service: Neurosurgery;  Laterality: Left;  Left L23 microdiskectomy   LUMBAR LAMINECTOMY/DECOMPRESSION MICRODISCECTOMY N/A 05/27/2020   Procedure: L2-3, L3-4  decompression;  Surgeon: [name], MD;  Location: [facility];  Service: Orthopedics;  Laterality: N/A;   MV repair and resection of mass  1/12   RESECTION DISTAL CLAVICAL Left 05/16/2019   Procedure: RESECTION DISTAL CLAVICAL;  Surgeon: Mcarthur Rossetti, MD;  Location: [facility];  Service: Orthopedics;  Laterality: Left;   SHOULDER ARTHROSCOPY WITH ROTATOR CUFF REPAIR AND SUBACROMIAL DECOMPRESSION Left 05/16/2019   Procedure: SHOULDER ARTHROSCOPY WITH ROTATOR CUFF REPAIR AND SUBACROMIAL DECOMPRESSION;  Surgeon: Mcarthur Rossetti, MD;  Location: [facility];  Service: Orthopedics;  Laterality: Left;   TONSILLECTOMY  1971   TOTAL ABDOMINAL HYSTERECTOMY  1978   TRIGGER FINGER RELEASE Right 12/02/2021   Procedure: RIGHT RING FINGER A-1 PULLEY RELEASE;  Surgeon: Mcarthur Rossetti, MD;  Location: [facility];  Service: Orthopedics;  Laterality: Right;   VESICOVAGINAL FISTULA CLOSURE W/ TAH      ROS: Review of Systems Negative except as stated above  PHYSICAL EXAM: BP 127/72   Pulse 72   Temp 98 F (36.7 C) (Oral)   Ht 5' 8"$  (1.727 m)   Wt 242 lb (109.8 kg)   SpO2 100%   BMI 36.80 kg/m   Wt Readings from Last 3 Encounters:  08/09/22 242 lb (109.8 kg)  06/22/22 242 lb (109.8 kg)  06/07/22 242 lb 6.4 oz (110 kg)    Physical Exam  General appearance -  alert, well appearing, older AAF and in no distress Mental status - normal mood, behavior, speech, dress, motor activity, and thought processes Chest - clear to auscultation, no wheezes, rales or rhonchi, symmetric air entry Heart - normal rate, regular rhythm, normal S1, S2, no murmurs, rubs, clicks or gallops Musculoskeletal -right shoulder: Mild tenderness that seems exaggerated on palpation of the anterior joint line.  Moderate discomfort with attempted passive range of motion in all directions. Extremities -no lower extremity edema.    Opioid Risk Tool - 08/09/22 1435       Family History of Substance Abuse   Alcohol Positive Female    Illegal Drugs Negative    Rx Drugs Negative      Personal History of Substance Abuse   Alcohol Negative    Illegal Drugs Positive Female or Female   Use to smoke marijuana but not in years.  No further cocaine use   Rx Drugs Negative      Age   Age between 38-45 years  No      History of Preadolescent Sexual Abuse   History of Preadolescent Sexual Abuse Negative or Female      Psychological Disease   Psychological Disease Negative    Depression Positive      Total Score   Opioid Risk Tool Scoring 8  Opioid Risk Interpretation High Risk                Latest Ref Rng & Units 06/24/2022    3:56 AM 06/23/2022    4:25 AM 06/22/2022    3:12 PM  CMP  Glucose 70 - 99 mg/dL 124  132  145   BUN 8 - 23 mg/dL 37  37  33   Creatinine 0.44 - 1.00 mg/dL 1.78  1.67  1.68   Sodium 135 - 145 mmol/L 137  137  136   Potassium 3.5 - 5.1 mmol/L 5.1  5.0  4.7   Chloride 98 - 111 mmol/L 112  107  104   CO2 22 - 32 mmol/L 21  23  22   $ Calcium 8.9 - 10.3 mg/dL 8.2  8.2  8.7    Lipid Panel     Component Value Date/Time   CHOL 184 09/14/2021 1115   TRIG 44 09/14/2021 1115   HDL 106 09/14/2021 1115   CHOLHDL 1.7 09/14/2021 1115   CHOLHDL 2.8 04/28/2016 1028   VLDL 19 04/28/2016 1028   LDLCALC 69 09/14/2021 1115   LDLDIRECT 101.7 07/18/2011 0901     CBC    Component Value Date/Time   WBC 12.8 (H) 06/24/2022 0356   RBC 3.54 (L) 06/24/2022 0356   HGB 8.6 (L) 06/24/2022 0356   HGB 10.4 (L) 04/11/2022 1449   HCT 28.7 (L) 06/24/2022 0356   HCT 33.8 (L) 04/11/2022 1449   PLT 240 06/24/2022 0356   PLT 259 04/11/2022 1449   MCV 81.1 06/24/2022 0356   MCV 78 (L) 04/11/2022 1449   MCH 24.3 (L) 06/24/2022 0356   MCHC 30.0 06/24/2022 0356   RDW 17.2 (H) 06/24/2022 0356   RDW 15.3 04/11/2022 1449   LYMPHSABS 2.3 06/22/2022 1512   MONOABS 0.7 06/22/2022 1512   EOSABS 0.3 06/22/2022 1512   BASOSABS 0.1 06/22/2022 1512    ASSESSMENT AND PLAN: 1. Type 2 diabetes mellitus with morbid obesity ([place]) Diabetes with morbid obesity diagnosis associated with hypertension and CKD. A1c improved on Ozempic. When she is finished with the 0.25 mg of Ozempic, advised to start the 0.5 mg.  Continue Lantus insulin 10 units daily.  Continue to encourage healthy eating habits.  I will resend prescription for libre device - Ambulatory referral to Ophthalmology - Continuous Blood Gluc Receiver (FREESTYLE LIBRE 14 DAY READER) DEVI; UAD  Dispense: 1 each; Refill: 0 - Continuous Blood Gluc Sensor (FREESTYLE LIBRE SENSOR SYSTEM) MISC; Change sensor Q 2 wks  Dispense: 2 each; Refill: 12  2. Hypertension associated with diabetes ([place]) Repeat blood pressure today at goal.  No changes made to medicines listed above.  3. Stage 3b chronic kidney disease ([place]) Keep upcoming appointment with nephrology.  4. Iron deficiency anemia, unspecified iron deficiency anemia type Continue iron supplement. Provided with phone number for [facility].  Advised to call them this week to schedule colonoscopy.  5. Primary osteoarthritis of right shoulder Patient reporting significant pain in the right shoulder unresponsive to topical rubs.  Unable to use NSAIDs due to kidney function.  She is high risk based on opioid risk assessment. She was on tramadol in the past but we  discontinued it 12/2020 due to urine drug screen positive for cocaine.  She tells me that since then she has discontinued use of cocaine and marijuana.   We will give her a trial of tramadol again with close monitoring.  Went over with her benefits and possible side  effects of the use of tramadol.  I went over the controlled substance prescribing agreement with her and she agrees to proceed.  She is agreeable to doing urine drug screen today. NCCSRS reviewed - traMADol (ULTRAM) 50 MG tablet; Take 1 tablet (50 mg total) by mouth every 12 (twelve) hours as needed. Each prescription to last 1 mth  Dispense: 40 tablet; Refill: 0 - Ambulatory referral to Orthopedics  6. Malignant neoplasm of upper-outer quadrant of right breast in female, estrogen receptor positive ([place]) Currently still on Femara. Keep follow-up appointment with oncology next month.   7. Pain management contract agreement - [id number] 11+Oxyco+Alc+Crt-Bund  8. Encounter for screening mammogram for malignant neoplasm of breast - MM Digital Screening; Future  Addendum 08/10/2022: Phone call placed to patient this morning.  I informed her that I sent the prescription for the continuous glucose monitor to [facility] delivery.  I also inquired whether she submitted the urine yesterday as was requested as I do not see the results today.  Patient tells me that she did give the urine sample to my CMA.  Patient was given the opportunity to ask questions.  Patient verbalized understanding of the plan and was able to repeat key elements of the plan.   This documentation was completed using Radio producer.  Any transcriptional errors are unintentional.  Orders Placed This Encounter  Procedures   MM Digital Screening   [id number] 11+Oxyco+Alc+Crt-Bund   Ambulatory referral to Ophthalmology   Ambulatory referral to Orthopedics     Requested Prescriptions   Signed Prescriptions Disp Refills   traMADol  (ULTRAM) 50 MG tablet 40 tablet 0    Sig: Take 1 tablet (50 mg total) by mouth every 12 (twelve) hours as needed. Each prescription to last 1 mth   polyethylene glycol powder (GLYCOLAX/MIRALAX) 17 GM/SCOOP powder 3350 g 1    Sig: Take 17 g by mouth daily as needed for mild constipation.    Return in about 3 months (around 11/07/2022).  Karle Plumber, MD, FACP

## 2022-08-10 DIAGNOSIS — M19011 Primary osteoarthritis, right shoulder: Secondary | ICD-10-CM | POA: Insufficient documentation

## 2022-08-10 MED ORDER — FREESTYLE LIBRE SENSOR SYSTEM MISC: 12 refills | Status: DC

## 2022-08-10 MED ORDER — HYDRALAZINE HCL 100 MG PO TABS: 100.0000 mg | ORAL_TABLET | Freq: Two times a day (BID) | ORAL | 1 refills | Status: DC

## 2022-08-10 MED ORDER — DICLOFENAC SODIUM 1 % EX GEL: CUTANEOUS | 0 refills | Status: AC

## 2022-08-10 MED ORDER — GABAPENTIN 100 MG PO CAPS: 100.0000 mg | ORAL_CAPSULE | Freq: Three times a day (TID) | ORAL | 5 refills | Status: DC

## 2022-08-10 MED ORDER — FREESTYLE LIBRE 14 DAY READER DEVI: 0 refills | Status: DC

## 2022-08-10 NOTE — Telephone Encounter (Signed)
Requested medication (s) are due for refill today - yes  Requested medication (s) are on the active medication list -yes  Future visit scheduled -yes  Last refill: 07/26/21 #[id number]  Notes to clinic: outside provider Rx  Requested Prescriptions  Pending Prescriptions Disp Refills   gabapentin (NEURONTIN) 100 MG capsule 90 capsule 11    Sig: Take 1 capsule (100 mg total) by mouth 3 (three) times daily.     Neurology: Anticonvulsants - gabapentin Failed - 08/09/2022  2:30 PM      Failed - Cr in normal range and within 360 days    Creatinine  Date Value Ref Range Status  12/31/2020 267.1 20.0 - 300.0 mg/dL Final   Creat  Date Value Ref Range Status  04/28/2016 1.17 (H) 0.50 - 0.99 mg/dL Final    Comment:      For patients > or = 74 years of age: The upper reference limit for Creatinine is approximately 13% higher for people identified as African-American.      Creatinine, Ser  Date Value Ref Range Status  06/24/2022 1.78 (H) 0.44 - 1.00 mg/dL Final   Creatinine,U  Date Value Ref Range Status  06/05/2010  mg/dL Final   159.6 (NOTE)  Cutoff Values for Urine Drug Screen:        Drug Class           Cutoff (ng/mL)        Amphetamines            1000        Barbiturates             200        Cocaine Metabolites      300        Benzodiazepines          200        Methadone                 300        Opiates                 2000        Phencyclidine             25        Propoxyphene             300        Marijuana Metabolites     50  For medical purposes only.         Passed - Completed PHQ-2 or PHQ-9 in the last 360 days      Passed - Valid encounter within last 12 months    Recent Outpatient Visits           Yesterday Type 2 diabetes mellitus with morbid obesity ([place])   [place] [name], MD   3 weeks ago Community acquired pneumonia of left lower lobe of lung   [place] Karle Plumber B, MD   1 month ago Type 2 diabetes mellitus with morbid obesity [facility])   [place], Lynn L, RPH-CPP   2 months ago Type 2 diabetes mellitus with morbid obesity [facility])   [place], Buford L, RPH-CPP   3 months ago Type 2 diabetes mellitus with morbid obesity [facility])   [place],  Jarome Matin, RPH-CPP       Future Appointments             In 3 weeks Mcarthur Rossetti, MD [facility]   In 2 months [name], MD [place]            Signed Prescriptions Disp Refills   hydrALAZINE (APRESOLINE) 100 MG tablet 180 tablet 1    Sig: Take 1 tablet (100 mg total) by mouth 2 (two) times daily.     Cardiovascular:  Vasodilators Failed - 08/09/2022  2:30 PM      Failed - HCT in normal range and within 360 days    HCT  Date Value Ref Range Status  06/24/2022 28.7 (L) 36.0 - 46.0 % Final   Hematocrit  Date Value Ref Range Status  04/11/2022 33.8 (L) 34.0 - 46.6 % Final         Failed - HGB in normal range and within 360 days    Hemoglobin  Date Value Ref Range Status  06/24/2022 8.6 (L) 12.0 - 15.0 g/dL Final  04/11/2022 10.4 (L) 11.1 - 15.9 g/dL Final         Failed - RBC in normal range and within 360 days    RBC  Date Value Ref Range Status  06/24/2022 3.54 (L) 3.87 - 5.11 MIL/uL Final         Failed - WBC in normal range and within 360 days    WBC  Date Value Ref Range Status  06/24/2022 12.8 (H) 4.0 - 10.5 K/uL Final         Failed - ANA Screen, Ifa, Serum in normal range and within 360 days    Anti Nuclear Antibody(ANA)  Date Value Ref Range Status  12/23/2009 NEGATIVE NEGATIVE Final         Failed - Last BP in normal range    BP Readings from Last 1 Encounters:  08/09/22 127/72         Passed - PLT in normal range and within 360 days     Platelets  Date Value Ref Range Status  06/24/2022 240 150 - 400 K/uL Final  04/11/2022 259 150 - 450 x10E3/uL Final         Passed - Valid encounter within last 12 months    Recent Outpatient Visits           Yesterday Type 2 diabetes mellitus with morbid obesity ([place])   [place] [name], MD   3 weeks ago Community acquired pneumonia of left lower lobe of lung   [place] Karle Plumber B, MD   1 month ago Type 2 diabetes mellitus with morbid obesity [facility])   [place], [place] L, RPH-CPP   2 months ago Type 2 diabetes mellitus with morbid obesity [facility])   [place], [place] L, RPH-CPP   3 months ago Type 2 diabetes mellitus with morbid obesity [facility])   [place], Jarome Matin, RPH-CPP       Future Appointments             In 3 weeks Ninfa Linden, Lind Guest, MD [place]   In 2 months [name], MD [place]  diclofenac Sodium (VOLTAREN) 1 % GEL 100 g 0    Sig: APPLY 2 GRAMS TOPICALLY 4 (FOUR) TIMES DAILY AS NEEDED.     Analgesics:  Topicals Failed - 08/09/2022  2:30 PM      Failed - Manual Review: Labs are only required if the patient has taken medication for more than 8 weeks.      Failed - HGB in normal range and within 360 days    Hemoglobin  Date Value Ref Range Status  06/24/2022 8.6 (L) 12.0 - 15.0 g/dL Final  04/11/2022 10.4 (L) 11.1 - 15.9 g/dL Final         Failed - HCT in normal range and within 360 days    HCT  Date Value Ref Range Status  06/24/2022 28.7 (L) 36.0 - 46.0 % Final   Hematocrit  Date Value Ref Range Status  04/11/2022 33.8 (L) 34.0 - 46.6 % Final         Failed - Cr in normal range and within 360 days    Creatinine  Date Value  Ref Range Status  12/31/2020 267.1 20.0 - 300.0 mg/dL Final   Creat  Date Value Ref Range Status  04/28/2016 1.17 (H) 0.50 - 0.99 mg/dL Final    Comment:      For patients > or = 74 years of age: The upper reference limit for Creatinine is approximately 13% higher for people identified as African-American.      Creatinine, Ser  Date Value Ref Range Status  06/24/2022 1.78 (H) 0.44 - 1.00 mg/dL Final   Creatinine,U  Date Value Ref Range Status  06/05/2010  mg/dL Final   159.6 (NOTE)  Cutoff Values for Urine Drug Screen:        Drug Class           Cutoff (ng/mL)        Amphetamines            1000        Barbiturates             200        Cocaine Metabolites      300        Benzodiazepines          200        Methadone                 300        Opiates                 2000        Phencyclidine             25        Propoxyphene             300        Marijuana Metabolites     50  For medical purposes only.         Passed - PLT in normal range and within 360 days    Platelets  Date Value Ref Range Status  06/24/2022 240 150 - 400 K/uL Final  04/11/2022 259 150 - 450 x10E3/uL Final         Passed - eGFR is 30 or above and within 360 days    GFR, Est African American  Date Value Ref Range Status  06/23/2015 58 (L) >=60 mL/min Final   GFR calc Af Amer  Date Value Ref Range Status  05/15/2020 52 (L) >59 mL/min/1.73  Final    Comment:    **In accordance with recommendations from the NKF-ASN Task force,**   [facility] is in the process of updating its eGFR calculation to the   2021 CKD-EPI creatinine equation that estimates kidney function   without a race variable.    GFR, Est Non African American  Date Value Ref Range Status  06/23/2015 50 (L) >=60 mL/min Final    Comment:      The estimated GFR is a calculation valid for adults (>=28 years old) that uses the CKD-EPI algorithm to adjust for age and sex. It is   not to be used for children, pregnant women, hospitalized  patients,    patients on dialysis, or with rapidly changing kidney function. According to the NKDEP, eGFR >89 is normal, 60-89 shows mild impairment, 30-59 shows moderate impairment, 15-29 shows severe impairment and <15 is ESRD.      GFR, Estimated  Date Value Ref Range Status  06/24/2022 30 (L) >60 mL/min Final    Comment:    (NOTE) Calculated using the CKD-EPI Creatinine Equation (2021)    GFR  Date Value Ref Range Status  07/18/2011 69.03 >60.00 mL/min Final   eGFR  Date Value Ref Range Status  04/11/2022 39 (L) >59 mL/min/1.73 Final         Passed - Patient is not pregnant      Passed - Valid encounter within last 12 months    Recent Outpatient Visits           Yesterday Type 2 diabetes mellitus with morbid obesity ([place])   [place] [name], MD   3 weeks ago Community acquired pneumonia of left lower lobe of lung   [place] Karle Plumber B, MD   1 month ago Type 2 diabetes mellitus with morbid obesity [facility])   [place], [place] L, RPH-CPP   2 months ago Type 2 diabetes mellitus with morbid obesity [facility])   [place], [place] L, RPH-CPP   3 months ago Type 2 diabetes mellitus with morbid obesity [facility])   [place], RPH-CPP       Future Appointments             In 3 weeks Mcarthur Rossetti, MD [facility]   In 2 months [name], MD [place]               Requested Prescriptions  Pending Prescriptions Disp Refills   gabapentin (NEURONTIN) 100 MG capsule 90 capsule 11    Sig: Take 1 capsule (100 mg total) by mouth 3 (three) times daily.     Neurology: Anticonvulsants - gabapentin Failed - 08/09/2022  2:30 PM      Failed - Cr in normal  range and within 360 days    Creatinine  Date Value Ref Range Status  12/31/2020 267.1 20.0 - 300.0 mg/dL Final   Creat  Date Value Ref Range Status  04/28/2016 1.17 (H) 0.50 - 0.99 mg/dL Final    Comment:      For patients > or = 74 years of age: The upper reference limit for Creatinine is approximately 13% higher for people identified as African-American.      Creatinine, Ser  Date Value Ref Range Status  06/24/2022 1.78 (  H) 0.44 - 1.00 mg/dL Final   Creatinine,U  Date Value Ref Range Status  06/05/2010  mg/dL Final   159.6 (NOTE)  Cutoff Values for Urine Drug Screen:        Drug Class           Cutoff (ng/mL)        Amphetamines            1000        Barbiturates             200        Cocaine Metabolites      300        Benzodiazepines          200        Methadone                 300        Opiates                 2000        Phencyclidine             25        Propoxyphene             300        Marijuana Metabolites     50  For medical purposes only.         Passed - Completed PHQ-2 or PHQ-9 in the last 360 days      Passed - Valid encounter within last 12 months    Recent Outpatient Visits           Yesterday Type 2 diabetes mellitus with morbid obesity ([place])   [place] [name], MD   3 weeks ago Community acquired pneumonia of left lower lobe of lung   [place] Karle Plumber B, MD   1 month ago Type 2 diabetes mellitus with morbid obesity [facility])   [place], [place] L, RPH-CPP   2 months ago Type 2 diabetes mellitus with morbid obesity [facility])   [place], [place] L, RPH-CPP   3 months ago Type 2 diabetes mellitus with morbid obesity [facility])   [place], RPH-CPP       Future Appointments             In 3 weeks Mcarthur Rossetti, MD [facility]   In 2 months [name], MD [place]            Signed Prescriptions Disp Refills   hydrALAZINE (APRESOLINE) 100 MG tablet 180 tablet 1    Sig: Take 1 tablet (100 mg total) by mouth 2 (two) times daily.     Cardiovascular:  Vasodilators Failed - 08/09/2022  2:30 PM      Failed - HCT in normal range and within 360 days    HCT  Date Value Ref Range Status  06/24/2022 28.7 (L) 36.0 - 46.0 % Final   Hematocrit  Date Value Ref Range Status  04/11/2022 33.8 (L) 34.0 - 46.6 % Final         Failed - HGB in normal range and within 360 days    Hemoglobin  Date Value Ref Range Status  06/24/2022 8.6 (  L) 12.0 - 15.0 g/dL Final  04/11/2022 10.4 (L) 11.1 - 15.9 g/dL Final         Failed - RBC in normal range and within 360 days    RBC  Date Value Ref Range Status  06/24/2022 3.54 (L) 3.87 - 5.11 MIL/uL Final         Failed - WBC in normal range and within 360 days    WBC  Date Value Ref Range Status  06/24/2022 12.8 (H) 4.0 - 10.5 K/uL Final         Failed - ANA Screen, Ifa, Serum in normal range and within 360 days    Anti Nuclear Antibody(ANA)  Date Value Ref Range Status  12/23/2009 NEGATIVE NEGATIVE Final         Failed - Last BP in normal range    BP Readings from Last 1 Encounters:  08/09/22 127/72         Passed - PLT in normal range and within 360 days    Platelets  Date Value Ref Range Status  06/24/2022 240 150 - 400 K/uL Final  04/11/2022 259 150 - 450 x10E3/uL Final         Passed - Valid encounter within last 12 months    Recent Outpatient Visits           Yesterday Type 2 diabetes mellitus with morbid obesity ([place])   [place] [name], MD   3 weeks ago Community acquired pneumonia of left lower lobe of lung   [place] Karle Plumber B, MD   1 month ago Type 2  diabetes mellitus with morbid obesity [facility])   [place], [place] L, RPH-CPP   2 months ago Type 2 diabetes mellitus with morbid obesity [facility])   [place], [place] L, RPH-CPP   3 months ago Type 2 diabetes mellitus with morbid obesity [facility])   [place], RPH-CPP       Future Appointments             In 3 weeks Mcarthur Rossetti, MD [place]   In 2 months [name], MD [place]             diclofenac Sodium (VOLTAREN) 1 % GEL 100 g 0    Sig: APPLY 2 GRAMS TOPICALLY 4 (FOUR) TIMES DAILY AS NEEDED.     Analgesics:  Topicals Failed - 08/09/2022  2:30 PM      Failed - Manual Review: Labs are only required if the patient has taken medication for more than 8 weeks.      Failed - HGB in normal range and within 360 days    Hemoglobin  Date Value Ref Range Status  06/24/2022 8.6 (L) 12.0 - 15.0 g/dL Final  04/11/2022 10.4 (L) 11.1 - 15.9 g/dL Final         Failed - HCT in normal range and within 360 days    HCT  Date Value Ref Range Status  06/24/2022 28.7 (L) 36.0 - 46.0 % Final   Hematocrit  Date Value Ref Range Status  04/11/2022 33.8 (L) 34.0 - 46.6 % Final         Failed - Cr in normal range and within 360 days    Creatinine  Date Value Ref Range Status  12/31/2020 267.1 20.0 - 300.0 mg/dL Final   Creat  Date Value Ref Range Status  04/28/2016 1.17 (H) 0.50 - 0.99 mg/dL Final    Comment:      For patients > or = 74 years of age: The upper reference limit for Creatinine is approximately 13% higher for people identified as African-American.      Creatinine, Ser  Date Value Ref Range Status  06/24/2022 1.78 (H) 0.44 - 1.00 mg/dL Final   Creatinine,U  Date Value Ref Range Status  06/05/2010  mg/dL Final   159.6 (NOTE)  Cutoff  Values for Urine Drug Screen:        Drug Class           Cutoff (ng/mL)        Amphetamines            1000        Barbiturates             200        Cocaine Metabolites      300        Benzodiazepines          200        Methadone                 300        Opiates                 2000        Phencyclidine             25        Propoxyphene             300        Marijuana Metabolites     50  For medical purposes only.         Passed - PLT in normal range and within 360 days    Platelets  Date Value Ref Range Status  06/24/2022 240 150 - 400 K/uL Final  04/11/2022 259 150 - 450 x10E3/uL Final         Passed - eGFR is 30 or above and within 360 days    GFR, Est African American  Date Value Ref Range Status  06/23/2015 58 (L) >=60 mL/min Final   GFR calc Af Amer  Date Value Ref Range Status  05/15/2020 52 (L) >59 mL/min/1.73 Final    Comment:    **In accordance with recommendations from the NKF-ASN Task force,**   [facility] is in the process of updating its eGFR calculation to the   2021 CKD-EPI creatinine equation that estimates kidney function   without a race variable.    GFR, Est Non African American  Date Value Ref Range Status  06/23/2015 50 (L) >=60 mL/min Final    Comment:      The estimated GFR is a calculation valid for adults (>=96 years old) that uses the CKD-EPI algorithm to adjust for age and sex. It is   not to be used for children, pregnant women, hospitalized patients,    patients on dialysis, or with rapidly changing kidney function. According to the NKDEP, eGFR >89 is normal, 60-89 shows mild impairment, 30-59 shows moderate impairment, 15-29 shows severe impairment and <15 is ESRD.      GFR, Estimated  Date Value Ref Range Status  06/24/2022 30 (L) >60 mL/min Final    Comment:    (NOTE) Calculated using the  CKD-EPI Creatinine Equation (2021)    GFR  Date Value Ref Range Status  07/18/2011 69.03 >60.00 mL/min Final   eGFR  Date Value Ref Range  Status  04/11/2022 39 (L) >59 mL/min/1.73 Final         Passed - Patient is not pregnant      Passed - Valid encounter within last 12 months    Recent Outpatient Visits           Yesterday Type 2 diabetes mellitus with morbid obesity ([place])   [place] [name], MD   3 weeks ago Community acquired pneumonia of left lower lobe of lung   [place], MD   1 month ago Type 2 diabetes mellitus with morbid obesity [facility])   [place], [place] L, RPH-CPP   2 months ago Type 2 diabetes mellitus with morbid obesity [facility])   [place], [place] L, RPH-CPP   3 months ago Type 2 diabetes mellitus with morbid obesity [facility])   [place], [place], RPH-CPP       Future Appointments             In 3 weeks Ninfa Linden, Lind Guest, MD [place]   In 2 months [name], MD [place]

## 2022-08-10 NOTE — Telephone Encounter (Signed)
Per visit 08/09/22- labs reviewed and provider recommends continue listed medications  Requested Prescriptions  Pending Prescriptions Disp Refills   gabapentin (NEURONTIN) 100 MG capsule 90 capsule 11    Sig: Take 1 capsule (100 mg total) by mouth 3 (three) times daily.     Neurology: Anticonvulsants - gabapentin Failed - 08/09/2022  2:30 PM      Failed - Cr in normal range and within 360 days    Creatinine  Date Value Ref Range Status  12/31/2020 267.1 20.0 - 300.0 mg/dL Final   Creat  Date Value Ref Range Status  04/28/2016 1.17 (H) 0.50 - 0.99 mg/dL Final    Comment:      For patients > or = 74 years of age: The upper reference limit for Creatinine is approximately 13% higher for people identified as African-American.      Creatinine, Ser  Date Value Ref Range Status  06/24/2022 1.78 (H) 0.44 - 1.00 mg/dL Final   Creatinine,U  Date Value Ref Range Status  06/05/2010  mg/dL Final   159.6 (NOTE)  Cutoff Values for Urine Drug Screen:        Drug Class           Cutoff (ng/mL)        Amphetamines            1000        Barbiturates             200        Cocaine Metabolites      300        Benzodiazepines          200        Methadone                 300        Opiates                 2000        Phencyclidine             25        Propoxyphene             300        Marijuana Metabolites     50  For medical purposes only.         Passed - Completed PHQ-2 or PHQ-9 in the last 360 days      Passed - Valid encounter within last 12 months    Recent Outpatient Visits           Yesterday Type 2 diabetes mellitus with morbid obesity ([place])   [place] [name], MD   3 weeks ago Community acquired pneumonia of left lower lobe of lung   [place], MD   1 month ago Type 2 diabetes mellitus with morbid obesity [facility])   [place],  [place] L, RPH-CPP   2 months ago Type 2 diabetes mellitus with morbid obesity [facility])   [place], Ivy L, RPH-CPP   3 months ago Type 2 diabetes mellitus with morbid obesity [facility])   [place], RPH-CPP       Future Appointments             In 3 weeks Jean Rosenthal  Y, MD [place]   In 2 months [name], MD [place]             hydrALAZINE (APRESOLINE) 100 MG tablet 180 tablet 2    Sig: Take 1 tablet (100 mg total) by mouth 2 (two) times daily.     Cardiovascular:  Vasodilators Failed - 08/09/2022  2:30 PM      Failed - HCT in normal range and within 360 days    HCT  Date Value Ref Range Status  06/24/2022 28.7 (L) 36.0 - 46.0 % Final   Hematocrit  Date Value Ref Range Status  04/11/2022 33.8 (L) 34.0 - 46.6 % Final         Failed - HGB in normal range and within 360 days    Hemoglobin  Date Value Ref Range Status  06/24/2022 8.6 (L) 12.0 - 15.0 g/dL Final  04/11/2022 10.4 (L) 11.1 - 15.9 g/dL Final         Failed - RBC in normal range and within 360 days    RBC  Date Value Ref Range Status  06/24/2022 3.54 (L) 3.87 - 5.11 MIL/uL Final         Failed - WBC in normal range and within 360 days    WBC  Date Value Ref Range Status  06/24/2022 12.8 (H) 4.0 - 10.5 K/uL Final         Failed - ANA Screen, Ifa, Serum in normal range and within 360 days    Anti Nuclear Antibody(ANA)  Date Value Ref Range Status  12/23/2009 NEGATIVE NEGATIVE Final         Failed - Last BP in normal range    BP Readings from Last 1 Encounters:  08/09/22 127/72         Passed - PLT in normal range and within 360 days    Platelets  Date Value Ref Range Status  06/24/2022 240 150 - 400 K/uL Final  04/11/2022 259 150 - 450 x10E3/uL Final         Passed - Valid encounter within last 12 months     Recent Outpatient Visits           Yesterday Type 2 diabetes mellitus with morbid obesity ([place])   [place] [name], MD   3 weeks ago Community acquired pneumonia of left lower lobe of lung   [place] Karle Plumber B, MD   1 month ago Type 2 diabetes mellitus with morbid obesity [facility])   [place], [place] L, RPH-CPP   2 months ago Type 2 diabetes mellitus with morbid obesity [facility])   [place], [place] L, RPH-CPP   3 months ago Type 2 diabetes mellitus with morbid obesity [facility])   [place], RPH-CPP       Future Appointments             In 3 weeks Mcarthur Rossetti, MD [place]   In 2 months [name], MD [place]             diclofenac Sodium (VOLTAREN) 1 % GEL 100 g 0    Sig: APPLY 2 GRAMS TOPICALLY 4 (FOUR) TIMES DAILY AS NEEDED.  Analgesics:  Topicals Failed - 08/09/2022  2:30 PM      Failed - Manual Review: Labs are only required if the patient has taken medication for more than 8 weeks.      Failed - HGB in normal range and within 360 days    Hemoglobin  Date Value Ref Range Status  06/24/2022 8.6 (L) 12.0 - 15.0 g/dL Final  04/11/2022 10.4 (L) 11.1 - 15.9 g/dL Final         Failed - HCT in normal range and within 360 days    HCT  Date Value Ref Range Status  06/24/2022 28.7 (L) 36.0 - 46.0 % Final   Hematocrit  Date Value Ref Range Status  04/11/2022 33.8 (L) 34.0 - 46.6 % Final         Failed - Cr in normal range and within 360 days    Creatinine  Date Value Ref Range Status  12/31/2020 267.1 20.0 - 300.0 mg/dL Final   Creat  Date Value Ref Range Status  04/28/2016 1.17 (H) 0.50 - 0.99 mg/dL Final    Comment:      For patients >  or = 74 years of age: The upper reference limit for Creatinine is approximately 13% higher for people identified as African-American.      Creatinine, Ser  Date Value Ref Range Status  06/24/2022 1.78 (H) 0.44 - 1.00 mg/dL Final   Creatinine,U  Date Value Ref Range Status  06/05/2010  mg/dL Final   159.6 (NOTE)  Cutoff Values for Urine Drug Screen:        Drug Class           Cutoff (ng/mL)        Amphetamines            1000        Barbiturates             200        Cocaine Metabolites      300        Benzodiazepines          200        Methadone                 300        Opiates                 2000        Phencyclidine             25        Propoxyphene             300        Marijuana Metabolites     50  For medical purposes only.         Passed - PLT in normal range and within 360 days    Platelets  Date Value Ref Range Status  06/24/2022 240 150 - 400 K/uL Final  04/11/2022 259 150 - 450 x10E3/uL Final         Passed - eGFR is 30 or above and within 360 days    GFR, Est African American  Date Value Ref Range Status  06/23/2015 58 (L) >=60 mL/min Final   GFR calc Af Amer  Date Value Ref Range Status  05/15/2020 52 (L) >59 mL/min/1.73 Final    Comment:    **In accordance with recommendations from the NKF-ASN Task force,**   [facility] is in the process of updating its  eGFR calculation to the   2021 CKD-EPI creatinine equation that estimates kidney function   without a race variable.    GFR, Est Non African American  Date Value Ref Range Status  06/23/2015 50 (L) >=60 mL/min Final    Comment:      The estimated GFR is a calculation valid for adults (>=77 years old) that uses the CKD-EPI algorithm to adjust for age and sex. It is   not to be used for children, pregnant women, hospitalized patients,    patients on dialysis, or with rapidly changing kidney function. According to the NKDEP, eGFR >89 is normal, 60-89 shows mild impairment, 30-59 shows moderate  impairment, 15-29 shows severe impairment and <15 is ESRD.      GFR, Estimated  Date Value Ref Range Status  06/24/2022 30 (L) >60 mL/min Final    Comment:    (NOTE) Calculated using the CKD-EPI Creatinine Equation (2021)    GFR  Date Value Ref Range Status  07/18/2011 69.03 >60.00 mL/min Final   eGFR  Date Value Ref Range Status  04/11/2022 39 (L) >59 mL/min/1.73 Final         Passed - Patient is not pregnant      Passed - Valid encounter within last 12 months    Recent Outpatient Visits           Yesterday Type 2 diabetes mellitus with morbid obesity ([place])   [place] [name], MD   3 weeks ago Community acquired pneumonia of left lower lobe of lung   [place] Karle Plumber B, MD   1 month ago Type 2 diabetes mellitus with morbid obesity [facility])   [place], [place] L, RPH-CPP   2 months ago Type 2 diabetes mellitus with morbid obesity [facility])   [place], [place] L, RPH-CPP   3 months ago Type 2 diabetes mellitus with morbid obesity [facility])   [place], Jarome Matin, RPH-CPP       Future Appointments             In 3 weeks Ninfa Linden, Lind Guest, MD [place]   In 2 months [name], MD [place]

## 2022-08-11 LAB — DRUG SCREEN 764883 11+OXYCO+ALC+CRT-BUND
Amphetamines, Urine: NEGATIVE ng/mL
BENZODIAZ UR QL: NEGATIVE ng/mL
Barbiturate: NEGATIVE ng/mL
Cannabinoid Quant, Ur: NEGATIVE ng/mL
Cocaine (Metabolite): NEGATIVE ng/mL
Creatinine: 90.2 mg/dL (ref 20.0–300.0)
Ethanol: NEGATIVE %
Meperidine: NEGATIVE ng/mL
Methadone Screen, Urine: NEGATIVE ng/mL
OPIATE SCREEN URINE: NEGATIVE ng/mL
Oxycodone/Oxymorphone, Urine: NEGATIVE ng/mL
Phencyclidine: NEGATIVE ng/mL
Propoxyphene: NEGATIVE ng/mL
Tramadol: NEGATIVE ng/mL
pH, Urine: 5.5 (ref 4.5–8.9)

## 2022-08-31 ENCOUNTER — Encounter: Payer: Self-pay | Admitting: Orthopaedic Surgery

## 2022-08-31 ENCOUNTER — Ambulatory Visit (INDEPENDENT_AMBULATORY_CARE_PROVIDER_SITE_OTHER): Payer: Medicare PPO | Admitting: Orthopaedic Surgery

## 2022-08-31 DIAGNOSIS — G8929 Other chronic pain: Secondary | ICD-10-CM | POA: Diagnosis not present

## 2022-08-31 DIAGNOSIS — M25511 Pain in right shoulder: Secondary | ICD-10-CM

## 2022-08-31 MED ORDER — LIDOCAINE HCL 1 % IJ SOLN
3.0000 mL | INTRAMUSCULAR | Status: AC | PRN
  Administered 2022-08-31: 3 mL

## 2022-08-31 MED ORDER — METHYLPREDNISOLONE ACETATE 40 MG/ML IJ SUSP
40.0000 mg | INTRAMUSCULAR | Status: AC | PRN
  Administered 2022-08-31: 40 mg via INTRA_ARTICULAR

## 2022-08-31 NOTE — Progress Notes (Signed)
The patient is a 72 year old female that we have seen remotely in the past.  She comes in as referral from her primary care physician due to right shoulder pain.  I was able to review x-rays of her shoulder and it shows just some mild arthritic changes that is well located.  On my exam I can easily put her through internal and external rotation with a lot of pain is out of a portion of exam.  The rotator cuff itself is intact and her shoulder is well located with no deficits or rotation either.  I did go over the x-rays with her.  She does have some likely tendinitis of the shoulder.  She has remote history of her left shoulder but having arthroscopic surgery that we did a very long time ago.  Her last hemoglobin A1c was 7.1 so that is better than it was a year ago when it was 10.  I did recommend a steroid injection today to see if this would help from a pain and inflammation standpoint since right now do not feel that she is a surgical candidate given her multiple other comorbidities.  However, you would still try conservative treatment first and she understands this as well and agrees to it.  She did tolerate steroid injection well.  She understands that she needs to watch her blood glucose closely.  If things worsen she will let us know and can come back and see [place].      Procedure Note  Patient: [name]             Date of Birth: Sep 27, 1950           MRN: [id number]             Visit Date: 08/31/2022  Procedures: Visit Diagnoses:  1. Chronic right shoulder pain     Large Joint Inj: R subacromial bursa on 08/31/2022 2:04 PM Indications: pain and diagnostic evaluation Details: 22 G 1.5 in needle  Arthrogram: No  Medications: 3 mL lidocaine 1 %; 40 mg methylPREDNISolone acetate 40 MG/ML Outcome: tolerated well, no immediate complications Procedure, treatment alternatives, risks and benefits explained, specific risks discussed. Consent was given by the patient. Immediately prior  to procedure a time out was called to verify the correct patient, procedure, equipment, support staff and site/side marked as required. Patient was prepped and draped in the usual sterile fashion.

## 2022-09-01 ENCOUNTER — Encounter: Payer: Self-pay | Admitting: Radiology

## 2022-09-14 ENCOUNTER — Telehealth: Payer: Self-pay | Admitting: Adult Health

## 2022-09-14 NOTE — Telephone Encounter (Signed)
Rescheduled appointment per provider PAL. Patient is aware of the changes made to her upcoming appointment. 

## 2022-09-19 ENCOUNTER — Other Ambulatory Visit: Payer: Self-pay | Admitting: Cardiology

## 2022-09-23 ENCOUNTER — Ambulatory Visit: Payer: Medicare HMO | Admitting: Adult Health

## 2022-09-27 DIAGNOSIS — I639 Cerebral infarction, unspecified: Secondary | ICD-10-CM | POA: Diagnosis not present

## 2022-09-27 DIAGNOSIS — N390 Urinary tract infection, site not specified: Secondary | ICD-10-CM | POA: Diagnosis not present

## 2022-09-27 DIAGNOSIS — N1832 Chronic kidney disease, stage 3b: Secondary | ICD-10-CM | POA: Diagnosis not present

## 2022-09-27 DIAGNOSIS — I2510 Atherosclerotic heart disease of native coronary artery without angina pectoris: Secondary | ICD-10-CM | POA: Diagnosis not present

## 2022-09-27 DIAGNOSIS — E785 Hyperlipidemia, unspecified: Secondary | ICD-10-CM | POA: Diagnosis not present

## 2022-09-27 DIAGNOSIS — I1A0 Resistant hypertension: Secondary | ICD-10-CM | POA: Diagnosis not present

## 2022-09-27 DIAGNOSIS — E1122 Type 2 diabetes mellitus with diabetic chronic kidney disease: Secondary | ICD-10-CM | POA: Diagnosis not present

## 2022-09-28 ENCOUNTER — Other Ambulatory Visit: Payer: Self-pay | Admitting: Internal Medicine

## 2022-09-28 DIAGNOSIS — M19011 Primary osteoarthritis, right shoulder: Secondary | ICD-10-CM

## 2022-09-28 MED ORDER — TRAMADOL HCL 50 MG PO TABS: 50.0000 mg | ORAL_TABLET | Freq: Two times a day (BID) | ORAL | 0 refills | Status: DC | PRN

## 2022-09-28 NOTE — Telephone Encounter (Signed)
Requested medication (s) are due for refill today: yes  Requested medication (s) are on the active medication list: yes    Last refill: 08/09/22  #40  0 refills  Future visit scheduled yes 11/07/22  Notes to clinic:Not delegated, please review.  Requested Prescriptions  Pending Prescriptions Disp Refills   traMADol (ULTRAM) 50 MG tablet 40 tablet 0    Sig: Take 1 tablet (50 mg total) by mouth every 12 (twelve) hours as needed. Each prescription to last 1 mth     Not Delegated - Analgesics:  Opioid Agonists Failed - 09/28/2022  2:31 PM      Failed - This refill cannot be delegated      Passed - Urine Drug Screen completed in last 360 days      Passed - Valid encounter within last 3 months    Recent Outpatient Visits           1 month ago Type 2 diabetes mellitus with morbid obesity ([place])   [place] [name], MD   2 months ago Community acquired pneumonia of left lower lobe of lung   [place] Karle Plumber B, MD   2 months ago Type 2 diabetes mellitus with morbid obesity [facility])   [place], [place] L, RPH-CPP   3 months ago Type 2 diabetes mellitus with morbid obesity [facility])   [place], [place] L, RPH-CPP   4 months ago Type 2 diabetes mellitus with morbid obesity [facility])   [place], RPH-CPP       Future Appointments             In 1 month Wynetta Emery, Dalbert Batman, MD [place]

## 2022-09-28 NOTE — Telephone Encounter (Signed)
Medication Refill - Medication: traMADol (ULTRAM) 50 MG tablet   Has the patient contacted their pharmacy? Yes.   Pharmacy called  Preferred Pharmacy (with phone number or street name):  [place], [place] Phone: [phone]  Fax: [phone]     Has the patient been seen for an appointment in the last year OR does the patient have an upcoming appointment? Yes.    Agent: Please be advised that RX refills may take up to 3 business days. We ask that you follow-up with your pharmacy.

## 2022-09-30 ENCOUNTER — Other Ambulatory Visit: Payer: Self-pay | Admitting: Internal Medicine

## 2022-09-30 DIAGNOSIS — N1832 Chronic kidney disease, stage 3b: Secondary | ICD-10-CM

## 2022-10-04 LAB — LAB REPORT - SCANNED
Albumin, Urine POC: 136.2
Creatinine, POC: 68.1 mg/dL
EGFR: 38
Microalb Creat Ratio: 200

## 2022-10-12 ENCOUNTER — Inpatient Hospital Stay: Payer: Medicare PPO | Attending: Adult Health | Admitting: Adult Health

## 2022-10-12 ENCOUNTER — Encounter: Payer: Self-pay | Admitting: Adult Health

## 2022-10-12 VITALS — BP 149/56 | HR 70 | Temp 97.9°F | Resp 16 | Ht 68.0 in | Wt 241.1 lb

## 2022-10-12 DIAGNOSIS — Z170 Estrogen receptor positive status [ER+]: Secondary | ICD-10-CM | POA: Diagnosis not present

## 2022-10-12 DIAGNOSIS — Z79811 Long term (current) use of aromatase inhibitors: Secondary | ICD-10-CM | POA: Diagnosis not present

## 2022-10-12 DIAGNOSIS — M85852 Other specified disorders of bone density and structure, left thigh: Secondary | ICD-10-CM | POA: Diagnosis not present

## 2022-10-12 DIAGNOSIS — C50411 Malignant neoplasm of upper-outer quadrant of right female breast: Secondary | ICD-10-CM | POA: Diagnosis not present

## 2022-10-12 NOTE — Patient Instructions (Signed)
Bone Health Bones protect organs, store calcium, anchor muscles, and support the whole body. Keeping your bones strong is important, especially as you get older. You can take actions to help keep your bones strong and healthy. Why is keeping my bones healthy important?  Keeping your bones healthy is important because your body constantly replaces bone cells. Cells get old, and new cells take their place. As we age, we lose bone cells because the body may not be able to make enough new cells to replace the old cells. The amount of bone cells and bone tissue you have is referred to as bone mass. The higher your bone mass, the stronger your bones. The aging process leads to an overall loss of bone mass in the body, which can increase the likelihood of: Broken bones. A condition in which the bones become weak and brittle (osteoporosis). A large decline in bone mass occurs in older adults. In women, it occurs about the time of menopause. What actions can I take to keep my bones healthy? Good health habits are important for maintaining healthy bones. This includes eating nutritious foods and exercising regularly. To have healthy bones, you need to get enough of the right minerals and vitamins. Most nutrition experts recommend getting these nutrients from the foods that you eat. In some cases, taking supplements may also be recommended. Doing certain types of exercise is also important for bone health. What are the nutritional recommendations for healthy bones?  Eating a well-balanced diet with plenty of calcium and vitamin D will help to protect your bones. Nutritional recommendations vary from person to person. Ask your health care provider what is healthy for you. Here are some general guidelines. Get enough calcium Calcium is the most important (essential) mineral for bone health. Most people can get enough calcium from their diet, but supplements may be recommended for people who are at risk for  osteoporosis. Good sources of calcium include: Dairy products, such as low-fat or nonfat milk, cheese, and yogurt. Dark green leafy vegetables, such as bok choy and broccoli. Foods that have calcium added to them (are fortified). Foods that may be fortified with calcium include orange juice, cereal, bread, soy beverages, and tofu products. Nuts, such as almonds. Follow these recommended amounts for daily calcium intake: Infants, 0-6 months: 200 mg. Infants, 6-12 months: 260 mg. Children, age 1-3: 700 mg. Children, age 4-8: 1,000 mg. Children, age 9-13: 1,300 mg. Teens, age 14-18: 1,300 mg. Adults, age 19-50: 1,000 mg. Adults, age 51-70: Men: 1,000 mg. Women: 1,200 mg. Adults, age 71 or older: 1,200 mg. Pregnant and breastfeeding females: Teens: 1,300 mg. Adults: 1,000 mg. Get enough vitamin D Vitamin D is the most essential vitamin for bone health. It helps the body absorb calcium. Sunlight stimulates the skin to make vitamin D, so be sure to get enough sunlight. If you live in a cold climate or you do not get outside often, your health care provider may recommend that you take vitamin D supplements. Good sources of vitamin D in your diet include: Egg yolks. Saltwater fish. Milk and cereal fortified with vitamin D. Follow these recommended amounts for daily vitamin D intake: Infants, 0-12 months: 400 international units (IU). Children and teens, age 1-18: 600 international units. Adults, age 59 or younger: 600 international units. Adults, age 60 or older: 600-1,000 international units. Get other important nutrients Other nutrients that are important for bone health include: Phosphorus. This mineral is found in meat, poultry, dairy foods, nuts, and legumes. The   recommended daily intake for adult men and adult women is 700 mg. Magnesium. This mineral is found in seeds, nuts, dark green vegetables, and legumes. The recommended daily intake for adult men is 400-420 mg. For adult women,  it is 310-320 mg. Vitamin K. This vitamin is found in green leafy vegetables. The recommended daily intake is 120 mcg for adult men and 90 mcg for adult women. What type of physical activity is best for building and maintaining healthy bones? Weight-bearing and strength-building activities are important for building and maintaining healthy bones. Weight-bearing activities cause muscles and bones to work against gravity. Strength-building activities increase the strength of the muscles that support bones. Weight-bearing and muscle-building activities include: Walking and hiking. Jogging and running. Dancing. Gym exercises. Lifting weights. Tennis and racquetball. Climbing stairs. Aerobics. Adults should get at least 30 minutes of moderate physical activity on most days. Children should get at least 60 minutes of moderate physical activity on most days. Ask your health care provider what type of exercise is best for you. How can I find out if my bone mass is low? Bone mass can be measured with an X-ray test called a bone mineral density (BMD) test. This test is recommended for all women who are age 65 or older. It may also be recommended for: Men who are age 70 or older. People who are at risk for osteoporosis because of: Having a long-term disease that weakens bones, such as kidney disease or rheumatoid arthritis. Having menopause earlier than normal. Taking medicine that weakens bones, such as steroids, thyroid hormones, or hormone treatment for breast cancer or prostate cancer. Smoking. Drinking three or more alcoholic drinks a day. Being underweight. Sedentary lifestyle. If you find that you have a low bone mass, you may be able to prevent osteoporosis or further bone loss by changing your diet and lifestyle. Where can I find more information? Bone Health & Osteoporosis Foundation: www.nof.org/patients National Institutes of Health: www.bones.nih.gov International Osteoporosis  Foundation: www.iofbonehealth.org Summary The aging process leads to an overall loss of bone mass in the body, which can increase the likelihood of broken bones and osteoporosis. Eating a well-balanced diet with plenty of calcium and vitamin D will help to protect your bones. Weight-bearing and strength-building activities are also important for building and maintaining strong bones. Bone mass can be measured with an X-ray test called a bone mineral density (BMD) test. This information is not intended to replace advice given to you by your health care provider. Make sure you discuss any questions you have with your health care provider. Document Revised: 11/25/2020 Document Reviewed: 11/25/2020 Elsevier Patient Education  2023 [employer]  

## 2022-10-12 NOTE — Progress Notes (Signed)
[facility] Cancer Follow up:    Marcine Matar, MD [address] [place] [address]   DIAGNOSIS:  Cancer Staging  Malignant neoplasm of upper-outer quadrant of right breast in female, estrogen receptor positive Staging form: Breast, AJCC 8th Edition - Clinical: Stage IA (cT1c, cN0, cM0, G2, ER+, PR+, HER2-) - Unsigned Histologic grading system: 3 grade system - Pathologic: Stage IA (pT1c, pN0, cM0, G2, ER+, PR+, HER2-) - Unsigned Histologic grading system: 3 grade system   SUMMARY OF ONCOLOGIC HISTORY: Oncology History  Malignant neoplasm of upper-outer quadrant of right breast in female, estrogen receptor positive  06/12/2017 Initial Diagnosis   Right breast biopsy 10:00: IDC grade 2, ER 95%, PR 30%, Ki-67 15%, HER-2 negative ratio 1.36, 1.7 cm mass in the right upper outer quadrant, T1CN0 stage I a clinical stage   07/21/2017 Surgery   Rt Lumpectomy: Grade 2 IDC 1.7 cm, 0/2 LN Neg, Margins Neg, ER 95%, PR 30%, Ki-67 15%, HER-2 negative ratio 1.36 T1cN0 Stage 1A   08/22/2017 - 09/15/2017 Radiation Therapy   Adjuvant radiation therapy   08/23/2017 Oncotype testing   Oncotype DX recurrence score 14: Risk of distant recurrence of 9 years: 4%, low risk   10/19/2017 -  Anti-estrogen oral therapy   Letrozole daily     CURRENT THERAPY: Letrozole  INTERVAL HISTORY: [name] 72 y.o. female returns for follow-up of her history of breast cancer.  She continues on letrozole daily with good tolerance.  Her most recent mammogram occurred on September 20, 2021 demonstrating no mammographic evidence of malignancy in the left breast but possible asymmetry in the right breast which required a follow-up right breast diagnostic mammogram which further demonstrated no evidence of malignancy.  She had breast density category C.  Her next mammogram is recommended to occur in March 2024.  Her most recent bone density testing occurred on May 28, 2018  demonstrating osteopenia with a T-score -1.7 in the left femur.  Has not undergone repeat mammogram because her daughter was recently diagnosed with a brain tumor and has been undergoing removal and now radiation.  She has been helping her daughter tremendously with her recovery from this and this has been somewhat overwhelming.  Otherwise she has not had any health changes and continues to see primary care regularly.  Patient Active Problem List   Diagnosis Date Noted   Primary osteoarthritis of right shoulder 08/10/2022   CAP (community acquired pneumonia) 06/22/2022   Stage 3b chronic kidney disease (CKD) 06/22/2022   Trigger finger, right ring finger 12/02/2021   DM type 2 with diabetic peripheral neuropathy 07/26/2021   Bilateral low back pain with bilateral sciatica 07/26/2021   Gait abnormality 07/26/2021   Controlled substance agreement signed 12/31/2020   Shoulder stiffness, left 07/07/2020   Status post lumbar spine surgery for decompression of spinal cord 06/09/2020   Lumbar stenosis 05/27/2020   Spinal stenosis of lumbar region 05/21/2019   Complete tear of left rotator cuff 05/16/2019   Uncontrolled type 2 diabetes mellitus with hyperglycemia 12/19/2018   Microalbuminuria 04/08/2018   Genetic testing 09/25/2017   Family history of breast cancer    Family history of prostate cancer    Malignant neoplasm of upper-outer quadrant of right breast in female, estrogen receptor positive 06/26/2017   Lumbar herniated disc 04/15/2015   Abnormal nuclear stress test 04/03/2015   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  MEDICAL HISTORY: Past Medical History:  Diagnosis Date   Abscess in epidural space of L2-L5 lumbar  spine 03/2006   Anemia, iron deficiency    Arthritis    Breast cancer 06/12/2017   right breast   CAD (coronary artery disease), native coronary artery 03/30/2015   Cataract    Colon polyp    a. Multiple colonic polyps status post colonoscopy in October 2007, consistent with tubular adenoma, tubulovillous adenoma with no high-grade dysplasia or malignancy identified.    Constipation    Coronary artery disease    a. s/p CABG 06/2010: S-D2; S-PDA (at time of MV surgery).   Depression    Diabetes mellitus    type II   Family history of breast cancer    Family history of prostate cancer    Genetic testing 09/25/2017   Multi-Cancer panel (83 genes) @ [facility] - No pathogenic mutations detected   GERD (gastroesophageal reflux disease)    Hx of transient ischemic attack (TIA)    a. See stroke section.   Hyperlipidemia    Hypertension    Hypoxia    a. Has history of acute hypoxic respiratory failure in the setting of bronchitis/PNA or prior admissions.   MRSA infection    a. History of recurrent skin infection and soft tissue abscesses, with MRSA positive in the past.   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 mitral valve - s/p resection and MV repair 06/2010 Dr. Cornelius Moras.   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 Distal R MCA.   Stroke    a. 11/2009: mitral mass diagnosed at this time, also has distal R MCA stenosis, tx with coumadin. b. Readmitted 05/2010 with TIA symptoms - had not been taking Coumadin. s/p MV surgery 06/2010. Coumadin stopped 2013 after review of chart by Dr. Jens Som since mass was removed (stroke felt possibly related to this).     SURGICAL HISTORY: Past Surgical History:  Procedure Laterality Date   BREAST BIOPSY Right 08/26/2019   benign   BREAST LUMPECTOMY Right    BREAST LUMPECTOMY WITH RADIOACTIVE SEED AND SENTINEL LYMPH NODE BIOPSY  Right 07/20/2017   Procedure: RIGHT BREAST LUMPECTOMY WITH RADIOACTIVE SEED AND SENTINEL LYMPH NODE BIOPSY;  Surgeon: Ovidio Kin, MD;  Location: [facility] OR;  Service: General;  Laterality: Right;   CARDIAC CATHETERIZATION  04/03/2015   Procedure: Left Heart Cath and Cors/Grafts Angiography;  Surgeon: Peter M Swaziland, MD;  Location: [facility] INVASIVE CV LAB;  Service: Cardiovascular;;   CORONARY ARTERY BYPASS GRAFT  1/12   history of ankle fractures requiring surgery     LUMBAR LAMINECTOMY/DECOMPRESSION MICRODISCECTOMY Left 04/15/2015   Procedure: LUMBAR LAMINECTOMY/DECOMPRESSION MICRODISCECTOMY 1 LEVEL;  Surgeon: Tressie Stalker, MD;  Location: [facility] NEURO ORS;  Service: Neurosurgery;  Laterality: Left;  Left L23 microdiskectomy   LUMBAR LAMINECTOMY/DECOMPRESSION MICRODISCECTOMY N/A 05/27/2020   Procedure: L2-3, L3-4  decompression;  Surgeon: Eldred Manges, MD;  Location: [facility] OR;  Service: Orthopedics;  Laterality: N/A;   MV repair and resection of mass  1/12   RESECTION DISTAL CLAVICAL Left 05/16/2019   Procedure: RESECTION DISTAL CLAVICAL;  Surgeon: Kathryne Hitch, MD;  Location: [facility];  Service: Orthopedics;  Laterality: Left;   SHOULDER ARTHROSCOPY WITH ROTATOR CUFF REPAIR AND SUBACROMIAL DECOMPRESSION Left 05/16/2019   Procedure: SHOULDER ARTHROSCOPY WITH ROTATOR CUFF REPAIR AND SUBACROMIAL DECOMPRESSION;  Surgeon: Kathryne Hitch, MD;  Location: [facility];  Service: Orthopedics;  Laterality: Left;   TONSILLECTOMY  1971   TOTAL ABDOMINAL HYSTERECTOMY  1978   TRIGGER FINGER RELEASE Right 12/02/2021   Procedure: RIGHT RING FINGER A-1 PULLEY RELEASE;  Surgeon: Kathryne Hitch, MD;  Location: [facility];  Service: Orthopedics;  Laterality: Right;   VESICOVAGINAL FISTULA CLOSURE W/ TAH      SOCIAL HISTORY: Social History   Socioeconomic History   Marital status: Married    Spouse name: Not on file   Number of children: 3   Years  of education: some college   Highest education level: Not on file  Occupational History    Employer: UNEMPLOYED  Tobacco Use   Smoking status: Former    Types: Cigarettes    Quit date: 10/14/2010    Years since quitting: 12.0   Smokeless tobacco: Never  Vaping Use   Vaping Use: Never used  Substance and Sexual Activity   Alcohol use: Yes    Comment: social   Drug use: No   Sexual activity: Not Currently  Other Topics Concern   Not on file  Social History Narrative   Lives with husband, stay at home, uses cane occasionally, still active/ambulatory.    Left-handed.   No daily caffeine use.   Social Determinants of Health   Financial Resource Strain: Not on file  Food Insecurity: No Food Insecurity (06/28/2022)   Hunger Vital Sign    Worried About Running Out of Food in the Last Year: Never true    Ran Out of Food in the Last Year: Never true  Transportation Needs: No Transportation Needs (06/28/2022)   PRAPARE - Administrator, Civil Service (Medical): No    Lack of Transportation (Non-Medical): No  Physical Activity: Not on file  Stress: Not on file  Social Connections: Not on file  Intimate Partner Violence: Not At Risk (06/22/2022)   Humiliation, Afraid, Rape, and Kick questionnaire    Fear of Current or Ex-Partner: No    Emotionally Abused: No    Physically Abused: No    Sexually Abused: No    FAMILY HISTORY: Family History  Problem Relation Age of Onset   Colon cancer Mother        dx 101s; deceased 74   Irritable bowel syndrome Mother    Prostate cancer Father        deceased 76s   Cirrhosis Sister        died of GI bleed associated with cirrhosis of the liver   Stroke Brother    Prostate cancer Brother 37       deceased 51   Diabetes Brother        x3   Kidney disease Brother    Cancer Brother 53       unk. type   Breast cancer Cousin        several maternal 1st and 2nd cousins with breast cancer    Review of Systems  Constitutional:   Negative for appetite change, chills, fatigue, fever and unexpected weight change.  HENT:   Negative for hearing loss, lump/mass and trouble swallowing.   Eyes:  Negative for eye problems and icterus.  Respiratory:  Negative for chest tightness, cough and shortness of breath.  Cardiovascular:  Negative for chest pain, leg swelling and palpitations.  Gastrointestinal:  Negative for abdominal distention, abdominal pain, constipation, diarrhea, nausea and vomiting.  Endocrine: Negative for hot flashes.  Genitourinary:  Negative for difficulty urinating.   Musculoskeletal:  Negative for arthralgias.  Skin:  Negative for itching and rash.  Neurological:  Negative for dizziness, extremity weakness, headaches and numbness.  Hematological:  Negative for adenopathy. Does not bruise/bleed easily.  Psychiatric/Behavioral:  Negative for depression. The patient is not nervous/anxious.       PHYSICAL EXAMINATION    Vitals:   10/12/22 1050  BP: (!) 149/56  Pulse: 70  Resp: 16  Temp: 97.9 F (36.6 C)  SpO2: 99%    Physical Exam Constitutional:      General: She is not in acute distress.    Appearance: Normal appearance. She is not toxic-appearing.  HENT:     Head: Normocephalic and atraumatic.  Eyes:     General: No scleral icterus. Cardiovascular:     Rate and Rhythm: Normal rate and regular rhythm.     Pulses: Normal pulses.     Heart sounds: Normal heart sounds.  Pulmonary:     Effort: Pulmonary effort is normal.     Breath sounds: Normal breath sounds.  Chest:     Comments: Right breast status postlumpectomy and radiation no sign of local recurrence other than focal tenderness at 4:00 about 5 cm from the nipple with no nodularity in this area.  Her left breast is benign. Abdominal:     General: Abdomen is flat. Bowel sounds are normal. There is no distension.     Palpations: Abdomen is soft.     Tenderness: There is no abdominal tenderness.  Musculoskeletal:        General:  No swelling.     Cervical back: Neck supple.  Lymphadenopathy:     Cervical: No cervical adenopathy.  Skin:    General: Skin is warm and dry.     Findings: No rash.  Neurological:     General: No focal deficit present.     Mental Status: She is alert.  Psychiatric:        Mood and Affect: Mood normal.        Behavior: Behavior normal.     LABORATORY DATA:  CBC    Component Value Date/Time   WBC 12.8 (H) 06/24/2022 0356   RBC 3.54 (L) 06/24/2022 0356   HGB 8.6 (L) 06/24/2022 0356   HGB 10.4 (L) 04/11/2022 1449   HCT 28.7 (L) 06/24/2022 0356   HCT 33.8 (L) 04/11/2022 1449   PLT 240 06/24/2022 0356   PLT 259 04/11/2022 1449   MCV 81.1 06/24/2022 0356   MCV 78 (L) 04/11/2022 1449   MCH 24.3 (L) 06/24/2022 0356   MCHC 30.0 06/24/2022 0356   RDW 17.2 (H) 06/24/2022 0356   RDW 15.3 04/11/2022 1449   LYMPHSABS 2.3 06/22/2022 1512   MONOABS 0.7 06/22/2022 1512   EOSABS 0.3 06/22/2022 1512   BASOSABS 0.1 06/22/2022 1512    CMP     Component Value Date/Time   NA 137 06/24/2022 0356   NA 142 04/11/2022 1449   K 5.1 06/24/2022 0356   CL 112 (H) 06/24/2022 0356   CO2 21 (L) 06/24/2022 0356   GLUCOSE 124 (H) 06/24/2022 0356   BUN 37 (H) 06/24/2022 0356   BUN 23 04/11/2022 1449   CREATININE 1.78 (H) 06/24/2022 0356   CREATININE 1.17 (H) 04/28/2016 1028   CALCIUM 8.2 (L) 06/24/2022  0356   PROT 6.6 04/11/2022 1449   ALBUMIN 4.0 04/11/2022 1449   AST 17 04/11/2022 1449   ALT 12 04/11/2022 1449   ALKPHOS 63 04/11/2022 1449   BILITOT <0.2 04/11/2022 1449   GFRNONAA 30 (L) 06/24/2022 0356   GFRNONAA 50 (L) 06/23/2015 1711   GFRAA 52 (L) 05/15/2020 1104   GFRAA 58 (L) 06/23/2015 1711        ASSESSMENT and THERAPY PLAN:   Malignant neoplasm of upper-outer quadrant of right breast in female, estrogen receptor positive (HCC) 07/21/17: Rt Lumpectomy: Grade 2 IDC 1.7 cm, 0/2 LN Neg, Margins Neg, ER 95%, PR 30%, Ki-67 15%, HER-2 negative ratio 1.36 T1cN0 Stage 1A    Pathology counseling: I discussed the final pathology report of the patient provided  a copy of this report. I discussed the margins as well as lymph node surgeries. We also discussed the final staging along with previously performed ER/PR and HER-2/neu testing. Oncotype DX recurrence score 14: Risk of distant recurrence of 9 years: 4%, low risk Adjuvant radiation 08/22/2017-09/18/2017 Adjuvant Radiation: 08/22/2017-09/15/2017 Adjuvant antiestrogen therapy with letrozole 2.5 mg daily started on 10/19/2017 _____________________________________________________________________________  Stage Ia right breast cancer ER/PR positive: She will continue on letrozole daily since she is tolerating it well we will do a total of 7 years of therapy.  She will undergo repeat mammogram and I ordered a diagnostic mammogram due to the focal tenderness at 4:00 in her right breast. Bone health: Her most recent bone density testing occurred in 2019 demonstrating osteopenia.  Though this was mild she does need repeat bone density testing.  I placed orders for this to occur in July since she has been overwhelmed with appointments surrounding her daughter and her brain tumor.  Gave her information today on calcium, vitamin D, and weightbearing exercises for bone health. Health maintenance: Recommend that she continue to follow-up with primary care and stay up-to-date with her cancer screening.  We discussed healthy diet and exercise today.  The above was reviewed with Dr. Pamelia Hoit in detail and Paylin will return in 1 year for continued long-term follow-up and surveillance while she is taking her letrozole.    All questions were answered. The patient knows to call the clinic with any problems, questions or concerns. We can certainly see the patient much sooner if necessary.  Total encounter time:20 minutes*in face-to-face visit time, chart review, lab review, care coordination, order entry, and documentation of the encounter  time.  Lillard Anes, NP 10/12/22 11:21 AM Medical Oncology and Hematology [facility] [address] [place], [place] [address] Tel. [phone]    Fax. [phone]  *Total Encounter Time as defined by the Centers for Medicare and Medicaid Services includes, in addition to the face-to-face time of a patient visit (documented in the note above) non-face-to-face time: obtaining and reviewing outside history, ordering and reviewing medications, tests or procedures, care coordination (communications with other health care professionals or caregivers) and documentation in the medical record.

## 2022-10-12 NOTE — Assessment & Plan Note (Signed)
07/21/17: Rt Lumpectomy: Grade 2 IDC 1.7 cm, 0/2 LN Neg, Margins Neg, ER 95%, PR 30%, Ki-67 15%, HER-2 negative ratio 1.36 T1cN0 Stage 1A   Pathology counseling: I discussed the final pathology report of the patient provided  a copy of this report. I discussed the margins as well as lymph node surgeries. We also discussed the final staging along with previously performed ER/PR and HER-2/neu testing. Oncotype DX recurrence score 14: Risk of distant recurrence of 9 years: 4%, low risk Adjuvant radiation 08/22/2017-09/18/2017 Adjuvant Radiation: 08/22/2017-09/15/2017 Adjuvant antiestrogen therapy with letrozole 2.5 mg daily started on 10/19/2017 _____________________________________________________________________________  Stage Ia right breast cancer ER/PR positive: She will continue on letrozole daily since she is tolerating it well we will do a total of 7 years of therapy.  She will undergo repeat mammogram and I ordered a diagnostic mammogram due to the focal tenderness at 4:00 in her right breast. Bone health: Her most recent bone density testing occurred in 2019 demonstrating osteopenia.  Though this was mild she does need repeat bone density testing.  I placed orders for this to occur in July since she has been overwhelmed with appointments surrounding her daughter and her brain tumor.  Gave her information today on calcium, vitamin D, and weightbearing exercises for bone health. Health maintenance: Recommend that she continue to follow-up with primary care and stay up-to-date with her cancer screening.  We discussed healthy diet and exercise today.  The above was reviewed with Dr. Pamelia Hoit in detail and [name] will return in 1 year for continued long-term follow-up and surveillance while she is taking her letrozole.

## 2022-10-13 ENCOUNTER — Telehealth: Payer: Self-pay | Admitting: Hematology and Oncology

## 2022-10-13 NOTE — Telephone Encounter (Signed)
Scheduled appointment per 4/17 los. Patient is aware of the made appointment.

## 2022-10-14 ENCOUNTER — Telehealth: Payer: Self-pay | Admitting: Internal Medicine

## 2022-10-14 ENCOUNTER — Other Ambulatory Visit: Payer: Self-pay | Admitting: Internal Medicine

## 2022-10-14 MED ORDER — TRAZODONE HCL 50 MG PO TABS: 50.0000 mg | ORAL_TABLET | Freq: Every evening | ORAL | 1 refills | Status: DC | PRN

## 2022-10-14 MED ORDER — OZEMPIC (0.25 OR 0.5 MG/DOSE) 2 MG/3ML ~~LOC~~ SOPN: 0.5000 mg | PEN_INJECTOR | SUBCUTANEOUS | 2 refills | Status: DC

## 2022-10-14 NOTE — Telephone Encounter (Signed)
Medication Refill - Medication: traZODone (DESYREL) 50 MG tablet   Has the patient contacted their pharmacy? No.   Preferred Pharmacy (with phone number or street name):  [facility] - [place], [place] - [address] Phone: [phone]  Fax: [phone]     Has the patient been seen for an appointment in the last year OR does the patient have an upcoming appointment? Yes.    Agent: Please be advised that RX refills may take up to 3 business days. We ask that you follow-up with your pharmacy.

## 2022-10-14 NOTE — Telephone Encounter (Signed)
Requested Prescriptions  Pending Prescriptions Disp Refills   traZODone (DESYREL) 50 MG tablet 90 tablet 1    Sig: Take 1 tablet (50 mg total) by mouth at bedtime as needed. for sleep     Psychiatry: Antidepressants - Serotonin Modulator Passed - 10/14/2022  1:53 PM      Passed - Valid encounter within last 6 months    Recent Outpatient Visits           2 months ago Type 2 diabetes mellitus with morbid obesity (HCC)   [place] [facility] Marcine Matar, MD   3 months ago Community acquired pneumonia of left lower lobe of lung   [place] [facility] Jonah Blue B, MD   3 months ago Type 2 diabetes mellitus with morbid obesity [facility])   [facility] Hus[place], Galliano L, RPH-CPP   4 months ago Type 2 diabetes mellitus with morbid obesity [facility])   [place] [facility] [place], Tipton L, RPH-CPP   5 months ago Type 2 diabetes mellitus with morbid obesity [facility])    [facility] Drucilla Chalet, RPH-CPP       Future Appointments             In 3 weeks Marcine Matar, MD [facility]

## 2022-10-14 NOTE — Telephone Encounter (Signed)
Rxn sent for a 30 day supply to requested [facility] instead of 90 days to [facility].

## 2022-10-14 NOTE — Telephone Encounter (Signed)
Medication Refill - Medication: Semaglutide,0.25 or 0.5MG /DOS, (OZEMPIC, 0.25 OR 0.5 MG/DOSE,) 2 MG/3ML SOPN   Pt states that she is on a fixed income and cannot afford for the medication to be sent through [facility]. Pt is wanting a 1 month supply to be sent to [facility] and would like for it to be sent to [facility] every month. Pt cannot afford to buy 90 day supply at one time.     Preferred Pharmacy (with phone number or street name): [facility] - [address] [place], [place] - [address]  Phone: [phone] Fax: [phone]

## 2022-10-19 ENCOUNTER — Encounter: Payer: Self-pay | Admitting: Internal Medicine

## 2022-10-19 ENCOUNTER — Other Ambulatory Visit: Payer: Self-pay | Admitting: Adult Health

## 2022-10-19 DIAGNOSIS — Z170 Estrogen receptor positive status [ER+]: Secondary | ICD-10-CM

## 2022-10-19 NOTE — Progress Notes (Signed)
Note received from nephrologist Dr. Melanee Spry.  Patient was seen 09/27/2022.  Diagnosis CKD stage IIIb.  Most likely diabetic kidney.  Possible genetic predisposition given family history.  Goal will be optimization of blood pressure as well as medications targeting proteinuria.  Will start SGLT2 I at some point in the next 3 to 6 months.  Agree with GLP-1 agonist.  Consider genetic testing for Apol 1. Labs: BUN 36/creatinine 1.46/GFR 38 H/H 11.1/34.5 Iron 66/iron saturation 21%/ferritin 51 Intact PTH 33. SPEP revealed no M spike.  Free light chains revealed normal ratio. Aldosterone level 2.2/renin activity 2.8. Kidney Doppler ultrasound ordered

## 2022-10-20 DIAGNOSIS — N1832 Chronic kidney disease, stage 3b: Secondary | ICD-10-CM | POA: Diagnosis not present

## 2022-10-28 ENCOUNTER — Encounter: Payer: Self-pay | Admitting: Internal Medicine

## 2022-11-01 ENCOUNTER — Ambulatory Visit
Admission: RE | Admit: 2022-11-01 | Discharge: 2022-11-01 | Disposition: A | Discharge status: Home / Self Care | Payer: Medicare PPO | Source: Ambulatory Visit | Attending: Adult Health | Admitting: Adult Health

## 2022-11-01 DIAGNOSIS — C50411 Malignant neoplasm of upper-outer quadrant of right female breast: Secondary | ICD-10-CM

## 2022-11-01 DIAGNOSIS — Z1231 Encounter for screening mammogram for malignant neoplasm of breast: Secondary | ICD-10-CM | POA: Diagnosis not present

## 2022-11-01 DIAGNOSIS — N644 Mastodynia: Secondary | ICD-10-CM | POA: Diagnosis not present

## 2022-11-02 ENCOUNTER — Other Ambulatory Visit: Payer: Medicare PPO

## 2022-11-04 DIAGNOSIS — N1832 Chronic kidney disease, stage 3b: Secondary | ICD-10-CM | POA: Diagnosis not present

## 2022-11-07 ENCOUNTER — Encounter: Payer: Self-pay | Admitting: Internal Medicine

## 2022-11-07 ENCOUNTER — Ambulatory Visit: Payer: Medicare PPO | Attending: Internal Medicine | Admitting: Internal Medicine

## 2022-11-07 ENCOUNTER — Telehealth: Payer: Self-pay | Admitting: Internal Medicine

## 2022-11-07 DIAGNOSIS — E1159 Type 2 diabetes mellitus with other circulatory complications: Secondary | ICD-10-CM | POA: Diagnosis not present

## 2022-11-07 DIAGNOSIS — Z7985 Long-term (current) use of injectable non-insulin antidiabetic drugs: Secondary | ICD-10-CM | POA: Diagnosis not present

## 2022-11-07 DIAGNOSIS — N1832 Chronic kidney disease, stage 3b: Secondary | ICD-10-CM

## 2022-11-07 DIAGNOSIS — I152 Hypertension secondary to endocrine disorders: Secondary | ICD-10-CM

## 2022-11-07 DIAGNOSIS — E1169 Type 2 diabetes mellitus with other specified complication: Secondary | ICD-10-CM | POA: Diagnosis not present

## 2022-11-07 DIAGNOSIS — D509 Iron deficiency anemia, unspecified: Secondary | ICD-10-CM | POA: Diagnosis not present

## 2022-11-07 DIAGNOSIS — Z6838 Body mass index (BMI) 38.0-38.9, adult: Secondary | ICD-10-CM | POA: Diagnosis not present

## 2022-11-07 DIAGNOSIS — Z1211 Encounter for screening for malignant neoplasm of colon: Secondary | ICD-10-CM

## 2022-11-07 LAB — POCT GLYCOSYLATED HEMOGLOBIN (HGB A1C): HbA1c, POC (controlled diabetic range): 6.4 % (ref 0.0–7.0)

## 2022-11-07 LAB — GLUCOSE, POCT (MANUAL RESULT ENTRY)
POC Glucose: 74 mg/dl (ref 70–99)
POC Glucose: 89 mg/dl (ref 70–99)

## 2022-11-07 MED ORDER — FREESTYLE LIBRE 14 DAY READER DEVI: 0 refills | Status: DC

## 2022-11-07 MED ORDER — FREESTYLE LIBRE 2 SENSOR MISC
6 refills | Status: AC
Start: 2022-11-07 — End: ?

## 2022-11-07 MED ORDER — HYDRALAZINE HCL 100 MG PO TABS: ORAL_TABLET | ORAL | 1 refills | Status: DC

## 2022-11-07 MED ORDER — FREESTYLE LIBRE SENSOR SYSTEM MISC
12 refills | Status: DC
Start: 2022-11-07 — End: 2022-11-07

## 2022-11-07 MED ORDER — FREESTYLE LIBRE 2 READER DEVI
0 refills | Status: AC
Start: 2022-11-07 — End: ?

## 2022-11-07 NOTE — Telephone Encounter (Signed)
Rxns sent for the correct meter.

## 2022-11-07 NOTE — Telephone Encounter (Signed)
[facility] is calling to report that the script that was sent today for Continuous Glucose Sensor (FREESTYLE LIBRE SENSOR SYSTEM) MISC [[id number]]  is no longer made. Please advise [phone]

## 2022-11-07 NOTE — Progress Notes (Signed)
Patient ID: [name], female    DOB: 07/10/1950  MRN: [id number]  CC: Diabetes (DM f/u. Med refills)   Subjective: [name] is a 72 y.o. female who presents for chronic ds management Her concerns today include:  Pt with hx of HTN, DM, HL, depression, chronic back pain, RT breast CA (intraductal CA, ER/PR+, lumpectomy and XRT - completed 08/2017), CKD stage 3, CVA 2011 (residual weakness on LT side), CAD s/p CABG and MVR/resection of fibroelastoma, IDA.    CKD 3b:  saw Dr. Valentino Nose last month.  GFR was 38, creatinine 1.46.  Iron studies consistent with iron deficiency.  H/H was 11.1/34.5.  IDA:  Referred to [facility] on end of last yr.   History of adenomatous colon polyps and is due for repeat C-scope.  She was called but had not called them back as yet.  She was told to do so on last visit.  Patient states she has not gotten around to it.  She has been busy helping to care for her daughter who was recently diagnosed with brain tumor and is undergoing daily XRT.   Taking Iron supplement daily  DM: Results for orders placed or performed in visit on 11/07/22  POCT glucose (manual entry)  Result Value Ref Range   POC Glucose 74 70 - 99 mg/dl  POCT glycosylated hemoglobin (Hb A1C)  Result Value Ref Range   Hemoglobin A1C     HbA1c POC (<> result, manual entry)     HbA1c, POC (prediabetic range)     HbA1c, POC (controlled diabetic range) 6.4 0.0 - 7.0 %  POCT glucose (manual entry)  Result Value Ref Range   POC Glucose 89 70 - 99 mg/dl  On Lantus 10 units daily and Ozempic 0.25 mg once a wk.  She did not increase the Ozempic to the 0.5 mg as advised on last visit.  Tolerating Ozempic Has not check BS in a while.  Reports insurance will not pay for CGM in past.  Thinks insurance will now cover it as she has had change in coverage. Gained 13 lbs since last visit.  Eating out for breakfast every morning at [facility] including biscuits and jelly.  Eating more bread.   BS today is  low.  Did not eat since BF and now it is 3:26 p.m Constantly on the good Taking and tolerating Lipitor Did not do eye exam as yet.  Plans to do it once her daughter completes XRT treatment for brain tumor.  HTN:  Reports compliance with meds that includes carvedilol 25 mg twice a day, Norvasc 10 mg daily, hydralazine 100 mg twice a day, valsartan/HCTZ 160/25 mg daily.  Took morning meds already for today. Not checking BP.  No device. Feels she does well with limiting salt No CP/SOB    Patient Active Problem List   Diagnosis Date Noted   Primary osteoarthritis of right shoulder 08/10/2022   CAP (community acquired pneumonia) 06/22/2022   Stage 3b chronic kidney disease (CKD) (HCC) 06/22/2022   Trigger finger, right ring finger 12/02/2021   DM type 2 with diabetic peripheral neuropathy (HCC) 07/26/2021   Bilateral low back pain with bilateral sciatica 07/26/2021   Gait abnormality 07/26/2021   Controlled substance agreement signed 12/31/2020   Shoulder stiffness, left 07/07/2020   Status post lumbar spine surgery for decompression of spinal cord 06/09/2020   Lumbar stenosis 05/27/2020   Spinal stenosis of lumbar region 05/21/2019   Complete tear of left rotator cuff 05/16/2019  Uncontrolled type 2 diabetes mellitus with hyperglycemia (HCC) 12/19/2018   Microalbuminuria 04/08/2018   Genetic testing 09/25/2017   Family history of breast cancer    Family history of prostate cancer    Malignant neoplasm of upper-outer quadrant of right breast in female, estrogen receptor positive (HCC) 06/26/2017   Lumbar herniated disc 04/15/2015   Abnormal nuclear stress test 04/03/2015   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 (Patient taking differently: Take 325 mg by mouth every 6 (six) hours as needed for mild pain.) 30 tablet 2   albuterol (VENTOLIN HFA) 108 (90 Base) MCG/ACT inhaler Inhale 2 puffs into the lungs every 6 (six) hours as needed for wheezing or shortness of breath. 1 each 0   amLODipine (NORVASC) 10 MG tablet TAKE 1 TABLET EVERY DAY 90 tablet 2   Ascorbic Acid (VITAMIN C PO) Take 500 mg by mouth daily.     aspirin EC 81 MG tablet Take 1 tablet (81 mg total) by mouth daily. 30 tablet 5   atorvastatin (LIPITOR) 80 MG tablet TAKE 1 TABLET EVERY DAY (DOSE INCREASE) (Patient taking differently: Take 80 mg by mouth daily.) 90 tablet 2   Blood Glucose Monitoring Suppl (ACCU-CHEK AVIVA PLUS) w/Device KIT USE AS DIRECTED 1 kit 0   buPROPion (WELLBUTRIN XL) 150 MG 24 hr tablet TAKE 1 TABLET TWICE DAILY 180 tablet 1   carvedilol (COREG) 25 MG tablet TAKE 1 TABLET TWICE DAILY 180 tablet 3   cholecalciferol (VITAMIN D3) 25 MCG (1000 UNIT) tablet Take 1,000 Units by mouth daily.     diclofenac Sodium (VOLTAREN) 1 % GEL APPLY 2 GRAMS TOPICALLY 4 (FOUR) TIMES DAILY AS NEEDED. 100 g 0   ferrous sulfate (FEROSUL) 325 (65 FE) MG tablet TAKE 1 TABLET (325 MG TOTAL) BY MOUTH DAILY WITH BREAKFAST. (Patient taking differently: Take 325 mg by mouth daily with breakfast.) 90 tablet 1   gabapentin (NEURONTIN) 100 MG capsule Take 1 capsule (100 mg total) by mouth 3 (three) times daily. 90 capsule 5   glucose blood (ACCU-CHEK AVIVA PLUS) test strip Use as instructed 100 each 12   insulin glargine (LANTUS) 100 UNIT/ML Solostar Pen Inject 10 Units into the skin at bedtime. 15 mL 3   Insulin Pen Needle (PEN NEEDLES) 31G X 8 MM MISC UAD 100 each 6   letrozole (FEMARA) 2.5 MG tablet TAKE 1 TABLET EVERY DAY (APPOINTMENT IS NEEDED FOR REFILLS) (Patient taking differently:  Take 2.5 mg by mouth daily.) 90 tablet 3   methocarbamol (ROBAXIN) 500 MG tablet TAKE 1 TABLET EVERY 8 HOURS AS NEEDED FOR MUSCLE SPASMS. (Patient taking differently: Take 500 mg by mouth every 8 (eight) hours as needed for muscle spasms.) 60 tablet 1   pantoprazole (PROTONIX) 40 MG tablet TAKE 1 TABLET EVERY DAY (Patient taking differently: Take 40 mg by mouth daily.) 90 tablet 1   polyethylene glycol powder (GLYCOLAX/MIRALAX) 17 GM/SCOOP powder Take 17 g by mouth daily as needed for mild constipation. 3350 g 1   Semaglutide,0.25 or 0.5MG /DOS, (OZEMPIC, 0.25 OR 0.5 MG/DOSE,) 2 MG/3ML SOPN Inject 0.5 mg  into the skin once a week. 3 mL 2   traMADol (ULTRAM) 50 MG tablet Take 1 tablet (50 mg total) by mouth every 12 (twelve) hours as needed. Each prescription to last 1 mth 40 tablet 0   traZODone (DESYREL) 50 MG tablet Take 1 tablet (50 mg total) by mouth at bedtime as needed. for sleep 90 tablet 1   valsartan-hydrochlorothiazide (DIOVAN-HCT) 160-25 MG tablet TAKE 1 TABLET EVERY DAY 90 tablet 2   Accu-Chek Softclix Lancets lancets Use as instructed (Patient not taking: Reported on 11/07/2022) 100 each 12   No current facility-administered medications on file prior to visit.    No Known Allergies  Social History   Socioeconomic History   Marital status: Married    Spouse name: Not on file   Number of children: 3   Years of education: some college   Highest education level: Not on file  Occupational History    Employer: UNEMPLOYED  Tobacco Use   Smoking status: Former    Types: Cigarettes    Quit date: 10/14/2010    Years since quitting: 12.0   Smokeless tobacco: Never  Vaping Use   Vaping Use: Never used  Substance and Sexual Activity   Alcohol use: Yes    Comment: social   Drug use: No   Sexual activity: Not Currently  Other Topics Concern   Not on file  Social History Narrative   Lives with husband, stay at home, uses cane occasionally, still active/ambulatory.    Left-handed.    No daily caffeine use.   Social Determinants of Health   Financial Resource Strain: Not on file  Food Insecurity: No Food Insecurity (06/28/2022)   Hunger Vital Sign    Worried About Running Out of Food in the Last Year: Never true    Ran Out of Food in the Last Year: Never true  Transportation Needs: No Transportation Needs (06/28/2022)   PRAPARE - Administrator, Civil Service (Medical): No    Lack of Transportation (Non-Medical): No  Physical Activity: Not on file  Stress: Not on file  Social Connections: Not on file  Intimate Partner Violence: Not At Risk (06/22/2022)   Humiliation, Afraid, Rape, and Kick questionnaire    Fear of Current or Ex-Partner: No    Emotionally Abused: No    Physically Abused: No    Sexually Abused: No    Family History  Problem Relation Age of Onset   Colon cancer Mother        dx 34s; deceased 79   Irritable bowel syndrome Mother    Prostate cancer Father        deceased 35s   Cirrhosis Sister        died of GI bleed associated with cirrhosis of the liver   Stroke Brother    Prostate cancer Brother 33       deceased 44   Diabetes Brother        x3   Kidney disease Brother    Cancer Brother 53       unk. type   Breast cancer Cousin        several maternal 1st and 2nd cousins with breast cancer    Past Surgical History:  Procedure Laterality Date   BREAST BIOPSY Right 08/26/2019   benign   BREAST LUMPECTOMY Right    BREAST LUMPECTOMY WITH RADIOACTIVE SEED AND SENTINEL LYMPH NODE BIOPSY Right 07/20/2017   Procedure: RIGHT BREAST LUMPECTOMY WITH RADIOACTIVE SEED AND SENTINEL LYMPH NODE BIOPSY;  Surgeon: Ovidio Kin, MD;  Location: [facility] OR;  Service: General;  Laterality: Right;   CARDIAC CATHETERIZATION  04/03/2015   Procedure: Left Heart Cath and Cors/Grafts Angiography;  Surgeon: Peter M Swaziland, MD;  Location: [facility] INVASIVE CV LAB;  Service: Cardiovascular;;   CORONARY ARTERY BYPASS GRAFT  1/12   history of ankle fractures  requiring surgery     LUMBAR LAMINECTOMY/DECOMPRESSION MICRODISCECTOMY Left 04/15/2015   Procedure: LUMBAR LAMINECTOMY/DECOMPRESSION MICRODISCECTOMY 1 LEVEL;  Surgeon: Tressie Stalker, MD;  Location: [facility] ORS;  Service: Neurosurgery;  Laterality: Left;  Left L23 microdiskectomy   LUMBAR LAMINECTOMY/DECOMPRESSION MICRODISCECTOMY N/A 05/27/2020   Procedure: L2-3, L3-4  decompression;  Surgeon: Eldred Manges, MD;  Location: [facility] OR;  Service: Orthopedics;  Laterality: N/A;   MV repair and resection of mass  1/12   RESECTION DISTAL CLAVICAL Left 05/16/2019   Procedure: RESECTION DISTAL CLAVICAL;  Surgeon: Kathryne Hitch, MD;  Location: [facility];  Service: Orthopedics;  Laterality: Left;   SHOULDER ARTHROSCOPY WITH ROTATOR CUFF REPAIR AND SUBACROMIAL DECOMPRESSION Left 05/16/2019   Procedure: SHOULDER ARTHROSCOPY WITH ROTATOR CUFF REPAIR AND SUBACROMIAL DECOMPRESSION;  Surgeon: Kathryne Hitch, MD;  Location: [facility];  Service: Orthopedics;  Laterality: Left;   TONSILLECTOMY  1971   TOTAL ABDOMINAL HYSTERECTOMY  1978   TRIGGER FINGER RELEASE Right 12/02/2021   Procedure: RIGHT RING FINGER A-1 PULLEY RELEASE;  Surgeon: Kathryne Hitch, MD;  Location: [facility];  Service: Orthopedics;  Laterality: Right;   VESICOVAGINAL FISTULA CLOSURE W/ TAH      ROS: Review of Systems Negative except as stated above  PHYSICAL EXAM: BP (!) 150/65   Pulse 67   Temp 98.2 F (36.8 C) (Oral)   Ht 5\' 8"  (1.727 m)   Wt 255 lb (115.7 kg)   SpO2 100%   BMI 38.77 kg/m   Wt Readings from Last 3 Encounters:  11/07/22 255 lb (115.7 kg)  10/12/22 241 lb 1.6 oz (109.4 kg)  08/09/22 242 lb (109.8 kg)    Physical Exam  General appearance - alert, well appearing, and in no distress Mental status - normal mood, behavior, speech, dress, motor activity, and thought processes Neck - supple, no significant adenopathy Chest - clear to  auscultation, no wheezes, rales or rhonchi, symmetric air entry Heart -regular rate and rhythm.  Soft systolic ejection murmur along left sternal border. Extremities -trace bilateral lower extremity edema.      Latest Ref Rng & Units 06/24/2022    3:56 AM 06/23/2022    4:25 AM 06/22/2022    3:12 PM  CMP  Glucose 70 - 99 mg/dL 161  096  045   BUN 8 - 23 mg/dL 37  37  33   Creatinine 0.44 - 1.00 mg/dL 4.09  8.11  9.14   Sodium 135 - 145 mmol/L 137  137  136   Potassium 3.5 - 5.1 mmol/L 5.1  5.0  4.7   Chloride 98 - 111 mmol/L 112  107  104   CO2 22 - 32 mmol/L 21  23  22    Calcium 8.9 - 10.3 mg/dL 8.2  8.2  8.7    Lipid Panel     Component Value Date/Time   CHOL 184 09/14/2021 1115   TRIG 44 09/14/2021 1115   HDL 106 09/14/2021 1115   CHOLHDL 1.7 09/14/2021 1115   CHOLHDL 2.8 04/28/2016 1028   VLDL 19 04/28/2016 1028   LDLCALC 69 09/14/2021 1115   LDLDIRECT 101.7  07/18/2011 0901    CBC    Component Value Date/Time   WBC 12.8 (H) 06/24/2022 0356   RBC 3.54 (L) 06/24/2022 0356   HGB 8.6 (L) 06/24/2022 0356   HGB 10.4 (L) 04/11/2022 1449   HCT 28.7 (L) 06/24/2022 0356   HCT 33.8 (L) 04/11/2022 1449   PLT 240 06/24/2022 0356   PLT 259 04/11/2022 1449   MCV 81.1 06/24/2022 0356   MCV 78 (L) 04/11/2022 1449   MCH 24.3 (L) 06/24/2022 0356   MCHC 30.0 06/24/2022 0356   RDW 17.2 (H) 06/24/2022 0356   RDW 15.3 04/11/2022 1449   LYMPHSABS 2.3 06/22/2022 1512   MONOABS 0.7 06/22/2022 1512   EOSABS 0.3 06/22/2022 1512   BASOSABS 0.1 06/22/2022 1512    ASSESSMENT AND PLAN:  1. Type 2 diabetes mellitus with morbid obesity (HCC) At goal. Encouraged her to cut back on the white starch especially the bread. She is agreeable to increasing the Ozempic to the 0.5 mg dose once a week. - POCT glucose (manual entry) - POCT glycosylated hemoglobin (Hb A1C) - Hepatic Function Panel - Lipid panel - POCT glucose (manual entry)  2. Hypertension associated with diabetes (HCC) Not  at goal.  Continue current medications listed above.  Increase hydralazine to 100 mg in the morning 50 mg at noon and 100 mg in the evening.  Follow-up with clinical pharmacist in 4 weeks for repeat blood pressure check.  Increasing the valsartan is probably not a good idea as potassium level tends to run in the high normal range. - hydrALAZINE (APRESOLINE) 100 MG tablet; 1 tab PO Q a.m, 1/2 tab Po Q noon and 1 tab PO Q p.m  Dispense: 225 tablet; Refill: 1  3. Iron deficiency anemia, unspecified iron deficiency anemia type Continue iron supplement.  Strongly advised her to follow through with getting her colonoscopy. - CBC  4. Stage 3b chronic kidney disease (HCC) Stable.  Continue to monitor  5. Screening for colon cancer - Ambulatory referral to Gastroenterology    Patient was given the opportunity to ask questions.  Patient verbalized understanding of the plan and was able to repeat key elements of the plan.   This documentation was completed using Paediatric nurse.  Any transcriptional errors are unintentional.  Orders Placed This Encounter  Procedures   CBC   Hepatic Function Panel   Lipid panel   Ambulatory referral to Gastroenterology   POCT glucose (manual entry)   POCT glycosylated hemoglobin (Hb A1C)   POCT glucose (manual entry)     Requested Prescriptions   Signed Prescriptions Disp Refills   hydrALAZINE (APRESOLINE) 100 MG tablet 225 tablet 1    Sig: 1 tab PO Q a.m, 1/2 tab Po Q noon and 1 tab PO Q p.m    Return in about 4 months (around 03/10/2023) for BP check Luke in 4 wks.  Jonah Blue, MD, FACP

## 2022-11-07 NOTE — Telephone Encounter (Signed)
[facility] called back and said that they could not get the reader, not the sensor. Please advise.

## 2022-11-07 NOTE — Patient Instructions (Signed)
Decrease Lantus insulin to 7 units daily.  Increase Ozempic to 0.5 mg once a week.  Increase hydralazine to 100 mg in the mornings, 50 mg at noon and 100 mg in the evening.  Follow-up with clinical pharmacist in 5 weeks for repeat blood pressure check.  I have resubmitted the referral to [facility] gastroenterology for colonoscopy.

## 2022-11-08 LAB — CBC
Hematocrit: 32.2 % — ABNORMAL LOW (ref 34.0–46.6)
Hemoglobin: 10 g/dL — ABNORMAL LOW (ref 11.1–15.9)
MCH: 24.8 pg — ABNORMAL LOW (ref 26.6–33.0)
MCHC: 31.1 g/dL — ABNORMAL LOW (ref 31.5–35.7)
MCV: 80 fL (ref 79–97)
Platelets: 259 10*3/uL (ref 150–450)
RBC: 4.03 x10E6/uL (ref 3.77–5.28)
RDW: 15.3 % (ref 11.7–15.4)
WBC: 6.1 10*3/uL (ref 3.4–10.8)

## 2022-11-08 LAB — HEPATIC FUNCTION PANEL
ALT: 21 IU/L (ref 0–32)
AST: 24 IU/L (ref 0–40)
Albumin: 4.1 g/dL (ref 3.8–4.8)
Alkaline Phosphatase: 58 IU/L (ref 44–121)
Bilirubin Total: 0.2 mg/dL (ref 0.0–1.2)
Bilirubin, Direct: <0.1 mg/dL (ref 0.00–0.40)
Total Protein: 6.4 g/dL (ref 6.0–8.5)

## 2022-11-08 LAB — LIPID PANEL
Chol/HDL Ratio: 2.1 ratio (ref 0.0–4.4)
Cholesterol, Total: 204 mg/dL — ABNORMAL HIGH (ref 100–199)
HDL: 98 mg/dL (ref >39)
LDL Chol Calc (NIH): 87 mg/dL (ref 0–99)
Triglycerides: 114 mg/dL (ref 0–149)
VLDL Cholesterol Cal: 19 mg/dL (ref 5–40)

## 2022-11-16 ENCOUNTER — Inpatient Hospital Stay: Admission: RE | Admit: 2022-11-16 | Payer: Medicare PPO | Source: Ambulatory Visit

## 2022-12-01 ENCOUNTER — Other Ambulatory Visit: Payer: Self-pay | Admitting: Adult Health

## 2022-12-06 ENCOUNTER — Encounter: Payer: Self-pay | Admitting: Pharmacist

## 2022-12-06 ENCOUNTER — Ambulatory Visit: Payer: Medicare PPO | Attending: Internal Medicine | Admitting: Pharmacist

## 2022-12-06 VITALS — BP 129/72 | HR 69 | Wt 247.4 lb

## 2022-12-06 DIAGNOSIS — E1159 Type 2 diabetes mellitus with other circulatory complications: Secondary | ICD-10-CM

## 2022-12-06 DIAGNOSIS — I152 Hypertension secondary to endocrine disorders: Secondary | ICD-10-CM

## 2022-12-06 DIAGNOSIS — Z7984 Long term (current) use of oral hypoglycemic drugs: Secondary | ICD-10-CM

## 2022-12-06 NOTE — Progress Notes (Signed)
   S:    72 y.o. female who presents for hypertension evaluation, education, and management. PMH is significant for HTN, CAD, CKD, HLD, and breast cancer.   Patient was referred and last seen by Primary Care Provider, Dr. Laural Benes, on 11/07/22. At last visit, her BP was elevated at 150/65 mmHg. Hydralazine dose was increased at that time.   Today, patient arrives in good spirits and presents with assistance of a cane. Denies blurred vision and chest pain. Endorses some LEE swelling, mild headaches, and dizziness with positional changes. Nephrology started Farxiga 10 mg 2 weeks ago.   Patient reports hypertension is longstanding  Family/Social history:  - Fhx: CVA, CKD, cirrhosis - Tobacco: former (quit 2012) - Alcohol: endorses  Medication adherence endorsed . Patient has taken BP medications today, however, she has not taken her midday dose of hydralazine as this "makes her sleepy." She normally takes this every day.  Current antihypertensives include: amlodipine 10 mg daily, carvedilol 25 mg BID, hydralazine 100 mg QAM, 50 mg midday, and 100 mg QPM, valsartan/HCTZ 160/25 mg daily  Reported home BP readings: not checking at home  O:  Vitals:   12/06/22 1440  BP: 129/72  Pulse: 69   Last 3 Office BP readings: BP Readings from Last 3 Encounters:  12/06/22 129/72  11/07/22 (!) 150/65  10/12/22 (!) 149/56   BMET    Component Value Date/Time   NA 137 06/24/2022 0356   NA 142 04/11/2022 1449   K 5.1 06/24/2022 0356   CL 112 (H) 06/24/2022 0356   CO2 21 (L) 06/24/2022 0356   GLUCOSE 124 (H) 06/24/2022 0356   BUN 37 (H) 06/24/2022 0356   BUN 23 04/11/2022 1449   CREATININE 1.78 (H) 06/24/2022 0356   CREATININE 1.17 (H) 04/28/2016 1028   CALCIUM 8.2 (L) 06/24/2022 0356   GFRNONAA 30 (L) 06/24/2022 0356   GFRNONAA 50 (L) 06/23/2015 1711   GFRAA 52 (L) 05/15/2020 1104   GFRAA 58 (L) 06/23/2015 1711    Renal function: CrCl cannot be calculated (Patient's most recent lab  result is older than the maximum 21 days allowed.).  Clinical ASCVD: Yes  The ASCVD Risk score (Arnett DK, et al., 2019) failed to calculate for the following reasons:   The patient has a prior MI or stroke diagnosis  A/P: Hypertension diagnosed currently controlled on current medications. BP goal < 130/80 mmHg. Medication adherence appears appropriate. -Continued amlodipine 10 mg daily - Continued carvedilol 25 mg BID - Continued hydralazine 100 mg QAM, 50 mg midday, and 100 mg QPM - Continued valsartan/HCTZ 160/25 mg daily - Continued Farxiga 10 mg daily -Patient educated on purpose, proper use, and potential adverse effects of Farxiga.  -Counseled on lifestyle modifications for blood pressure control including reduced dietary sodium, increased exercise, adequate sleep. -Encouraged patient to check BP at home and bring log of readings to next visit. Counseled on proper use of home BP cuff.   Results reviewed and written information provided.    Written patient instructions provided. Patient verbalized understanding of treatment plan.  Total time in face to face counseling 26 minutes.    Follow-up:  Pharmacist PRN. PCP clinic visit on 03/14/23.   Irish Elders, PharmD PGY-1 [facility] Pharmacy Resident

## 2022-12-07 ENCOUNTER — Other Ambulatory Visit: Payer: Self-pay | Admitting: Internal Medicine

## 2022-12-07 ENCOUNTER — Other Ambulatory Visit: Payer: Self-pay | Admitting: Neurology

## 2022-12-07 DIAGNOSIS — G8929 Other chronic pain: Secondary | ICD-10-CM

## 2022-12-07 DIAGNOSIS — M19011 Primary osteoarthritis, right shoulder: Secondary | ICD-10-CM

## 2022-12-21 ENCOUNTER — Other Ambulatory Visit: Payer: Self-pay | Admitting: Internal Medicine

## 2022-12-21 MED ORDER — TRUE METRIX BLOOD GLUCOSE TEST VI STRP
ORAL_STRIP | 12 refills | Status: DC
Start: 2022-12-21 — End: 2023-02-24

## 2022-12-21 MED ORDER — TRUE METRIX LEVEL 1 LOW VI SOLN
0 refills | Status: AC
Start: 2022-12-21 — End: ?

## 2023-01-03 DIAGNOSIS — N1832 Chronic kidney disease, stage 3b: Secondary | ICD-10-CM | POA: Diagnosis not present

## 2023-01-05 DIAGNOSIS — N1832 Chronic kidney disease, stage 3b: Secondary | ICD-10-CM | POA: Diagnosis not present

## 2023-01-10 DIAGNOSIS — I1A0 Resistant hypertension: Secondary | ICD-10-CM | POA: Diagnosis not present

## 2023-01-10 DIAGNOSIS — E1122 Type 2 diabetes mellitus with diabetic chronic kidney disease: Secondary | ICD-10-CM | POA: Diagnosis not present

## 2023-01-10 DIAGNOSIS — E785 Hyperlipidemia, unspecified: Secondary | ICD-10-CM | POA: Diagnosis not present

## 2023-01-10 DIAGNOSIS — N1832 Chronic kidney disease, stage 3b: Secondary | ICD-10-CM | POA: Diagnosis not present

## 2023-01-10 DIAGNOSIS — I639 Cerebral infarction, unspecified: Secondary | ICD-10-CM | POA: Diagnosis not present

## 2023-01-11 ENCOUNTER — Other Ambulatory Visit: Payer: Self-pay

## 2023-01-11 MED ORDER — DULOXETINE HCL 30 MG PO CPEP: 30.0000 mg | ORAL_CAPSULE | Freq: Every day | ORAL | 1 refills | Status: DC

## 2023-01-11 NOTE — Telephone Encounter (Signed)
Pended refill of duloxetine for Dr. Terrace Arabia to approve as it had some side effects listed and would be adding back to [facility] list since we refused because she needed to make appt which she did in office today when she accompanied her daughter to her visit w/Dr. Teresa Coombs.

## 2023-01-11 NOTE — Telephone Encounter (Signed)
Pt came to appt w/daughter needed refill of cymbalta it is no longer on med list because was refused since pt needed to make appt. I advised her upon check out to ask the lady at the desk to make her appt

## 2023-01-11 NOTE — Telephone Encounter (Signed)
Haleigh Please send her refill to [facility] on [address].

## 2023-01-11 NOTE — Telephone Encounter (Signed)
Pt scheduled for follow up with Dr. Terrace Arabia on 03/02/23 at 9am and appt added to wait list

## 2023-01-11 NOTE — Telephone Encounter (Signed)
 This encounter was created in error - please disregard.

## 2023-01-24 ENCOUNTER — Ambulatory Visit: Payer: Medicare PPO | Attending: Internal Medicine

## 2023-01-24 VITALS — Ht 68.0 in | Wt 247.0 lb

## 2023-01-24 DIAGNOSIS — Z0100 Encounter for examination of eyes and vision without abnormal findings: Secondary | ICD-10-CM

## 2023-01-24 DIAGNOSIS — Z0000 Encounter for general adult medical examination without abnormal findings: Secondary | ICD-10-CM | POA: Diagnosis not present

## 2023-01-24 NOTE — Progress Notes (Signed)
Subjective:   [name] is a 72 y.o. female who presents for Medicare Annual (Subsequent) preventive examination.  Visit Complete: Virtual  I connected with  Kipp Brood Roach on 01/24/23 by a audio enabled telemedicine application and verified that I am speaking with the correct person using two identifiers.  Patient Location: Home  Provider Location: Home Office  I discussed the limitations of evaluation and management by telemedicine. The patient expressed understanding and agreed to proceed.  Vital Signs: Per patient no change in vitals since last visit.  Review of Systems     Cardiac Risk Factors include: advanced age (>75men, >33 women);diabetes mellitus;dyslipidemia;hypertension;sedentary lifestyle     Objective:    Today's Vitals   01/24/23 1315  Weight: 247 lb (112 kg)  Height: 5\' 8"  (1.727 m)   Body mass index is 37.56 kg/m.     01/24/2023    1:21 PM 06/22/2022    6:45 PM 04/15/2022   12:12 PM 12/02/2021    6:42 AM 11/24/2021    1:00 PM 05/24/2021   11:09 AM 04/15/2021    6:01 PM  Advanced Directives  Does Patient Have a Medical Advance Directive? No No No No Yes No No  Would patient like information on creating a medical advance directive? Yes (MAU/Ambulatory/Procedural Areas - Information given) No - Patient declined Yes (MAU/Ambulatory/Procedural Areas - Information given) No - Patient declined  No - Patient declined No - Patient declined    Current Medications (verified) Outpatient Encounter Medications as of 01/24/2023  Medication Sig   Accu-Chek Softclix Lancets lancets Use as instructed   acetaminophen (TYLENOL) 325 MG tablet Take 1 tablet (325 mg total) by mouth every 6 (six) hours as needed. (Patient taking differently: Take 325 mg by mouth every 6 (six) hours as needed for mild pain.)   albuterol (VENTOLIN HFA) 108 (90 Base) MCG/ACT inhaler Inhale 2 puffs into the lungs every 6 (six) hours as needed for wheezing or shortness of breath.    amLODipine (NORVASC) 10 MG tablet TAKE 1 TABLET EVERY DAY   Ascorbic Acid (VITAMIN C PO) Take 500 mg by mouth daily.   aspirin EC 81 MG tablet Take 1 tablet (81 mg total) by mouth daily.   atorvastatin (LIPITOR) 80 MG tablet TAKE 1 TABLET EVERY DAY (DOSE INCREASE) (Patient taking differently: Take 80 mg by mouth daily.)   Blood Glucose Calibration (TRUE METRIX LEVEL 1) Low SOLN UAD   Blood Glucose Monitoring Suppl (ACCU-CHEK AVIVA PLUS) w/Device KIT USE AS DIRECTED   buPROPion (WELLBUTRIN XL) 150 MG 24 hr tablet TAKE 1 TABLET TWICE DAILY   carvedilol (COREG) 25 MG tablet TAKE 1 TABLET TWICE DAILY   cholecalciferol (VITAMIN D3) 25 MCG (1000 UNIT) tablet Take 1,000 Units by mouth daily.   Continuous Glucose Receiver (FREESTYLE LIBRE 2 READER) DEVI Use to check blood sugar three times daily. E11.69   Continuous Glucose Sensor (FREESTYLE LIBRE 2 SENSOR) MISC Use to check blood sugar three times daily. Change sensors once every 14 days. E11.69   diclofenac Sodium (VOLTAREN) 1 % GEL APPLY 2 GRAMS TOPICALLY 4 (FOUR) TIMES DAILY AS NEEDED.   DULoxetine (CYMBALTA) 30 MG capsule Take 1 capsule (30 mg total) by mouth daily.   FARXIGA 10 MG TABS tablet Take 10 mg by mouth daily.   ferrous sulfate (FEROSUL) 325 (65 FE) MG tablet TAKE 1 TABLET (325 MG TOTAL) BY MOUTH DAILY WITH BREAKFAST. (Patient taking differently: Take 325 mg by mouth daily with breakfast.)   gabapentin (NEURONTIN) 100  MG capsule Take 1 capsule (100 mg total) by mouth 3 (three) times daily.   glucose blood (TRUE METRIX BLOOD GLUCOSE TEST) test strip Use as instructed to check blood sugar1-3 times a day   hydrALAZINE (APRESOLINE) 100 MG tablet 1 tab PO Q a.m, 1/2 tab Po Q noon and 1 tab PO Q p.m   insulin glargine (LANTUS) 100 UNIT/ML Solostar Pen Inject 10 Units into the skin at bedtime.   Insulin Pen Needle (PEN NEEDLES) 31G X 8 MM MISC UAD   letrozole (FEMARA) 2.5 MG tablet TAKE 1 TABLET EVERY DAY   methocarbamol (ROBAXIN) 500 MG tablet  TAKE 1 TABLET EVERY 8 HOURS AS NEEDED FOR MUSCLE SPASMS.   pantoprazole (PROTONIX) 40 MG tablet TAKE 1 TABLET EVERY DAY (Patient taking differently: Take 40 mg by mouth daily.)   polyethylene glycol powder (GLYCOLAX/MIRALAX) 17 GM/SCOOP powder Take 17 g by mouth daily as needed for mild constipation.   Semaglutide,0.25 or 0.5MG /DOS, (OZEMPIC, 0.25 OR 0.5 MG/DOSE,) 2 MG/3ML SOPN Inject 0.5 mg into the skin once a week.   traMADol (ULTRAM) 50 MG tablet TAKE 1 TABLET EVERY 12 HOURS AS NEEDED . PRESCRIPTION TO LAST 1 MONTH.   traZODone (DESYREL) 50 MG tablet Take 1 tablet (50 mg total) by mouth at bedtime as needed. for sleep   valsartan-hydrochlorothiazide (DIOVAN-HCT) 160-25 MG tablet TAKE 1 TABLET EVERY DAY   No facility-administered encounter medications on file as of 01/24/2023.    Allergies (verified) Patient has no known allergies.   History: Past Medical History:  Diagnosis Date   Abscess in epidural space of L2-L5 lumbar spine 03/2006   Anemia, iron deficiency    Arthritis    Breast cancer (HCC) 06/12/2017   right breast   CAD (coronary artery disease), native coronary artery 03/30/2015   Cataract    Colon polyp    a. Multiple colonic polyps status post colonoscopy in October 2007, consistent with tubular adenoma, tubulovillous adenoma with no high-grade dysplasia or malignancy identified.    Constipation    Coronary artery disease    a. s/p CABG 06/2010: S-D2; S-PDA (at time of MV surgery).   Depression    Diabetes mellitus    type II   Family history of breast cancer    Family history of prostate cancer    Genetic testing 09/25/2017   Multi-Cancer panel (83 genes) @ [facility] - No pathogenic mutations detected   GERD (gastroesophageal reflux disease)    Hx of transient ischemic attack (TIA)    a. See stroke section.   Hyperlipidemia    Hypertension    Hypoxia    a. Has history of acute hypoxic respiratory failure in the setting of bronchitis/PNA or prior admissions.    MRSA infection    a. History of recurrent skin infection and soft tissue abscesses, with MRSA positive in the past.   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 mitral valve - s/p resection and MV repair 06/2010 Dr. Cornelius Moras.   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 Distal R MCA.   Stroke [facility])    a. 11/2009: mitral mass diagnosed at this time, also has distal R MCA stenosis, tx with coumadin. b. Readmitted 05/2010 with TIA symptoms - had not been taking Coumadin. s/p MV surgery 06/2010. Coumadin stopped 2013 after review of chart by Dr. Jens Som since  mass was removed (stroke felt possibly related to this).    Past Surgical History:  Procedure Laterality Date   BREAST BIOPSY Right 08/26/2019   benign   BREAST LUMPECTOMY Right    BREAST LUMPECTOMY WITH RADIOACTIVE SEED AND SENTINEL LYMPH NODE BIOPSY Right 07/20/2017   Procedure: RIGHT BREAST LUMPECTOMY WITH RADIOACTIVE SEED AND SENTINEL LYMPH NODE BIOPSY;  Surgeon: Ovidio Kin, MD;  Location: [facility] OR;  Service: General;  Laterality: Right;   CARDIAC CATHETERIZATION  04/03/2015   Procedure: Left Heart Cath and Cors/Grafts Angiography;  Surgeon: Peter M Swaziland, MD;  Location: [facility] INVASIVE CV LAB;  Service: Cardiovascular;;   CORONARY ARTERY BYPASS GRAFT  1/12   history of ankle fractures requiring surgery     LUMBAR LAMINECTOMY/DECOMPRESSION MICRODISCECTOMY Left 04/15/2015   Procedure: LUMBAR LAMINECTOMY/DECOMPRESSION MICRODISCECTOMY 1 LEVEL;  Surgeon: Tressie Stalker, MD;  Location: [facility] NEURO ORS;  Service: Neurosurgery;  Laterality: Left;  Left L23 microdiskectomy   LUMBAR LAMINECTOMY/DECOMPRESSION MICRODISCECTOMY N/A 05/27/2020   Procedure: L2-3, L3-4  decompression;  Surgeon: Eldred Manges, MD;  Location: [facility] OR;  Service: Orthopedics;  Laterality: N/A;   MV repair and resection of mass  1/12   RESECTION DISTAL CLAVICAL Left  05/16/2019   Procedure: RESECTION DISTAL CLAVICAL;  Surgeon: Kathryne Hitch, MD;  Location: [facility];  Service: Orthopedics;  Laterality: Left;   SHOULDER ARTHROSCOPY WITH ROTATOR CUFF REPAIR AND SUBACROMIAL DECOMPRESSION Left 05/16/2019   Procedure: SHOULDER ARTHROSCOPY WITH ROTATOR CUFF REPAIR AND SUBACROMIAL DECOMPRESSION;  Surgeon: Kathryne Hitch, MD;  Location: [facility];  Service: Orthopedics;  Laterality: Left;   TONSILLECTOMY  1971   TOTAL ABDOMINAL HYSTERECTOMY  1978   TRIGGER FINGER RELEASE Right 12/02/2021   Procedure: RIGHT RING FINGER A-1 PULLEY RELEASE;  Surgeon: Kathryne Hitch, MD;  Location: [facility];  Service: Orthopedics;  Laterality: Right;   VESICOVAGINAL FISTULA CLOSURE W/ TAH     Family History  Problem Relation Age of Onset   Colon cancer Mother        dx 64s; deceased 19   Irritable bowel syndrome Mother    Prostate cancer Father        deceased 43s   Cirrhosis Sister        died of GI bleed associated with cirrhosis of the liver   Stroke Brother    Prostate cancer Brother 23       deceased 31   Diabetes Brother        x3   Kidney disease Brother    Cancer Brother 1       unk. type   Breast cancer Cousin        several maternal 1st and 2nd cousins with breast cancer   Social History   Socioeconomic History   Marital status: Married    Spouse name: Not on file   Number of children: 3   Years of education: some college   Highest education level: Not on file  Occupational History    Employer: UNEMPLOYED  Tobacco Use   Smoking status: Former    Current packs/day: 0.00    Types: Cigarettes    Quit date: 10/14/2010    Years since quitting: 12.2   Smokeless tobacco: Never  Vaping Use   Vaping status: Never Used  Substance and Sexual Activity   Alcohol use: Yes    Comment: social   Drug use: No   Sexual activity: Not Currently  Other Topics Concern   Not on file  Social  History Narrative   Lives with husband, stay at home, uses cane occasionally, still active/ambulatory.    Left-handed.   No daily caffeine use.   Social Determinants of Health   Financial Resource Strain: Low Risk  (01/24/2023)   Overall Financial Resource Strain (CARDIA)    Difficulty of Paying Living Expenses: Not hard at all  Food Insecurity: No Food Insecurity (01/24/2023)   Hunger Vital Sign    Worried About Running Out of Food in the Last Year: Never true    Ran Out of Food in the Last Year: Never true  Transportation Needs: No Transportation Needs (01/24/2023)   PRAPARE - Administrator, Civil Service (Medical): No    Lack of Transportation (Non-Medical): No  Physical Activity: Insufficiently Active (01/24/2023)   Exercise Vital Sign    Days of Exercise per Week: 3 days    Minutes of Exercise per Session: 30 min  Stress: No Stress Concern Present (01/24/2023)   [employer] of Occupational Health - Occupational Stress Questionnaire    Feeling of Stress : Not at all  Social Connections: Moderately Integrated (01/24/2023)   Social Connection and Isolation Panel [NHANES]    Frequency of Communication with Friends and Family: More than three times a week    Frequency of Social Gatherings with Friends and Family: Three times a week    Attends Religious Services: More than 4 times per year    Active Member of Clubs or Organizations: No    Attends Banker Meetings: Never    Marital Status: Married    Tobacco Counseling Counseling given: Not Answered   Clinical Intake:  Pre-visit preparation completed: Yes  Pain : No/denies pain     Diabetes: Yes CBG done?: No Did pt. bring in CBG monitor from home?: No  How often do you need to have someone help you when you read instructions, pamphlets, or other written materials from your doctor or pharmacy?: 1 - Never  Interpreter Needed?: No  Information entered by :: Kandis Fantasia LPN   Activities  of Daily Living    01/24/2023    1:21 PM 06/22/2022    6:45 PM  In your present state of health, do you have any difficulty performing the following activities:  Hearing? 0 0  Vision? 0 0  Difficulty concentrating or making decisions? 0 0  Walking or climbing stairs? 1 1  Dressing or bathing? 0 0  Doing errands, shopping? 1 0  Preparing Food and eating ? N   Using the Toilet? N   In the past six months, have you accidently leaked urine? N   Do you have problems with loss of bowel control? N   Managing your Medications? N   Managing your Finances? N   Housekeeping or managing your Housekeeping? Y     Patient Care Team: Marcine Matar, MD as PCP - General (Internal Medicine) Quintella Reichert, MD as PCP - Cardiology (Cardiology) Serena Croissant, MD as Consulting Physician (Hematology and Oncology) Dorothy Puffer, MD as Consulting Physician (Radiation Oncology) Axel Filler, Larna Daughters, NP as Nurse Practitioner (Hematology and Oncology) Ovidio Kin, MD as Consulting Physician (General Surgery) Marcine Matar, MD (Internal Medicine) Valentino Nose Bernestine Amass, MD as Consulting Physician (Internal Medicine) Levert Feinstein, MD as Consulting Physician (Neurology)  Indicate any recent Medical Services you may have received from other than [facility] providers in the past year (date may be approximate).     Assessment:   This is a routine wellness  examination for Dreana.  Hearing/Vision screen Hearing Screening - Comments:: Denies hearing difficulties   Vision Screening - Comments:: No vision problems; will schedule routine eye exam soon    Dietary issues and exercise activities discussed:     Goals Addressed             This Visit's Progress    Remain active and independent        Depression Screen    01/24/2023    1:18 PM 11/07/2022    3:06 PM 08/09/2022    2:13 PM 04/15/2022   12:15 PM 09/06/2021   11:37 AM 05/24/2021   11:10 AM 05/11/2021   10:25 AM  PHQ 2/9 Scores  PHQ - 2  Score 0 0 1 0 0 0 0  PHQ- 9 Score  2 6  6  3     Fall Risk    01/24/2023    1:20 PM 11/07/2022    2:57 PM 04/15/2022   12:00 PM 04/11/2022    1:45 PM 09/06/2021   11:30 AM  Fall Risk   Falls in the past year? 0 0 1 1 0  Number falls in past yr: 0 0 0 0 0  Injury with Fall? 0 0 0 0 0  Risk for fall due to : Impaired mobility No Fall Risks History of fall(s) History of fall(s)   Follow up Falls prevention discussed;Education provided;Falls evaluation completed  Falls evaluation completed      MEDICARE RISK AT HOME:  Medicare Risk at Home - 01/24/23 1321     Any stairs in or around the home? Yes    If so, are there any without handrails? No    Home free of loose throw rugs in walkways, pet beds, electrical cords, etc? Yes    Adequate lighting in your home to reduce risk of falls? Yes    Life alert? No    Use of a cane, walker or w/c? Yes    Grab bars in the bathroom? Yes    Shower chair or bench in shower? No    Elevated toilet seat or a handicapped toilet? Yes             TIMED UP AND GO:  Was the test performed?  No    Cognitive Function:    03/29/2021   10:02 AM 01/20/2020    4:53 PM  MMSE - Mini Mental State Exam  Orientation to time 5 5  Orientation to Place 5 5  Registration 3 3  Attention/ Calculation 0 5  Recall 3 2  Language- name 2 objects 2 2  Language- repeat 1 1  Language- follow 3 step command 3 3  Language- read & follow direction 1 1  Write a sentence 0 1  Copy design 0 1  Total score 23 29        01/24/2023    1:21 PM 04/15/2022   12:10 PM  6CIT Screen  What Year? 0 points 0 points  What month? 0 points 0 points  What time? 0 points 0 points  Count back from 20 0 points 0 points  Months in reverse 0 points 0 points  Repeat phrase 0 points 0 points  Total Score 0 points 0 points    Immunizations Immunization History  Administered Date(s) Administered   Fluad Quad(high Dose 65+) 04/11/2022   Influenza,inj,Quad PF,6+ Mos 06/26/2014,  03/26/2015, 04/24/2017, 04/06/2018, 04/19/2019, 03/16/2020, 03/29/2021   PFIZER(Purple Top)SARS-COV-2 Vaccination 07/16/2019, 08/06/2019   Pneumococcal Conjugate-13 04/24/2017   Pneumococcal  Polysaccharide-23 08/10/2018   Tdap 03/06/2014   Zoster Recombinant(Shingrix) 01/20/2020, 03/23/2020    TDAP status: Up to date  Pneumococcal vaccine status: Up to date  Covid-19 vaccine status: Information provided on how to obtain vaccines.   Qualifies for Shingles Vaccine? Yes   Zostavax completed No   Shingrix Completed?: Yes  Screening Tests Health Maintenance  Topic Date Due   COVID-19 Vaccine (3 - Pfizer risk series) 09/03/2019   Colonoscopy  11/24/2021   OPHTHALMOLOGY EXAM  08/23/2022   FOOT EXAM  09/07/2022   INFLUENZA VACCINE  01/26/2023   HEMOGLOBIN A1C  05/10/2023   Diabetic kidney evaluation - eGFR measurement  10/04/2023   Diabetic kidney evaluation - Urine ACR  10/04/2023   Medicare Annual Wellness (AWV)  01/24/2024   DTaP/Tdap/Td (2 - Td or Tdap) 03/06/2024   MAMMOGRAM  10/31/2024   Pneumonia Vaccine 40+ Years old  Completed   DEXA SCAN  Completed   Hepatitis C Screening  Completed   Zoster Vaccines- Shingrix  Completed   HPV VACCINES  Aged Out    Health Maintenance  Health Maintenance Due  Topic Date Due   COVID-19 Vaccine (3 - Pfizer risk series) 09/03/2019   Colonoscopy  11/24/2021   OPHTHALMOLOGY EXAM  08/23/2022   FOOT EXAM  09/07/2022    Colorectal cancer screening: Referral to GI placed and patient scheduled for 02/21/23 with Dr. Rhea Belton. Pt aware the office will call re: appt.  Mammogram status: Completed 11/01/22. Repeat every year  Bone Density status: Ordered and scheduled for 05/28/18. Pt provided with contact info and advised to call to schedule appt.  Lung Cancer Screening: (Low Dose CT Chest recommended if Age 74-80 years, 20 pack-year currently smoking OR have quit w/in 15years.) does not qualify.   Lung Cancer Screening Referral:  n/a  Additional Screening:  Hepatitis C Screening: does qualify; Completed 05/23/16  Vision Screening: Recommended annual ophthalmology exams for early detection of glaucoma and other disorders of the eye. Is the patient up to date with their annual eye exam?  No  Who is the provider or what is the name of the office in which the patient attends annual eye exams? none If pt is not established with a provider, would they like to be referred to a provider to establish care? Yes .   Dental Screening: Recommended annual dental exams for proper oral hygiene  Diabetic Foot Exam: Diabetic Foot Exam: Overdue, Pt has been advised about the importance in completing this exam. Pt is scheduled for diabetic foot exam on at next office visit .  Community Resource Referral / Chronic Care Management: CRR required this visit?  No   CCM required this visit?  No     Plan:     I have personally reviewed and noted the following in the patient's chart:   Medical and social history Use of alcohol, tobacco or illicit drugs  Current medications and supplements including opioid prescriptions. Patient is not currently taking opioid prescriptions. Functional ability and status Nutritional status Physical activity Advanced directives List of other physicians Hospitalizations, surgeries, and ER visits in previous 12 months Vitals Screenings to include cognitive, depression, and falls Referrals and appointments  In addition, I have reviewed and discussed with patient certain preventive protocols, quality metrics, and best practice recommendations. A written personalized care plan for preventive services as well as general preventive health recommendations were provided to patient.     Kandis Fantasia [place], [place]   1/32/4401   After Visit Summary: (Mail) Due  to this being a telephonic visit, the after visit summary with patients personalized plan was offered to patient via mail   Nurse Notes: No  concerns at this time

## 2023-01-24 NOTE — Patient Instructions (Signed)
Ms. [name] , Thank you for taking time to come for your Medicare Wellness Visit. I appreciate your ongoing commitment to your health goals. Please review the following plan we discussed and let me know if I can assist you in the future.   Referrals/Orders/Follow-Ups/Clinician Recommendations: You will receive a call with an appointment to see an eye doctor  This is a list of the screening recommended for you and due dates:  Health Maintenance  Topic Date Due   COVID-19 Vaccine (3 - Pfizer risk series) 09/03/2019   Colon Cancer Screening  11/24/2021   Eye exam for diabetics  08/23/2022   Complete foot exam   09/07/2022   Flu Shot  01/26/2023   Hemoglobin A1C  05/10/2023   Yearly kidney function blood test for diabetes  10/04/2023   Yearly kidney health urinalysis for diabetes  10/04/2023   Medicare Annual Wellness Visit  01/24/2024   DTaP/Tdap/Td vaccine (2 - Td or Tdap) 03/06/2024   Mammogram  10/31/2024   Pneumonia Vaccine  Completed   DEXA scan (bone density measurement)  Completed   Hepatitis C Screening  Completed   Zoster (Shingles) Vaccine  Completed   HPV Vaccine  Aged Out    Advanced directives: (ACP Link)Information on Advanced Care Planning can be found at [facility] Advance Health Care Directives Advance Health Care Directives ([email or web address]/)   Next Medicare Annual Wellness Visit scheduled for next year: Yes  Preventive Care 65 Years and Older, Female Preventive care refers to lifestyle choices and visits with your health care provider that can promote health and wellness. What does preventive care include? A yearly physical exam. This is also called an annual well check. Dental exams once or twice a year. Routine eye exams. Ask your health care provider how often you should have your eyes checked. Personal lifestyle choices, including: Daily care of your teeth and gums. Regular physical activity. Eating a healthy diet. Avoiding tobacco and drug  use. Limiting alcohol use. Practicing safe sex. Taking low-dose aspirin every day. Taking vitamin and mineral supplements as recommended by your health care provider. What happens during an annual well check? The services and screenings done by your health care provider during your annual well check will depend on your age, overall health, lifestyle risk factors, and family history of disease. Counseling  Your health care provider may ask you questions about your: Alcohol use. Tobacco use. Drug use. Emotional well-being. Home and relationship well-being. Sexual activity. Eating habits. History of falls. Memory and ability to understand (cognition). Work and work Astronomer. Reproductive health. Screening  You may have the following tests or measurements: Height, weight, and BMI. Blood pressure. Lipid and cholesterol levels. These may be checked every 5 years, or more frequently if you are over 71 years old. Skin check. Lung cancer screening. You may have this screening every year starting at age 68 if you have a 30-pack-year history of smoking and currently smoke or have quit within the past 15 years. Fecal occult blood test (FOBT) of the stool. You may have this test every year starting at age 25. Flexible sigmoidoscopy or colonoscopy. You may have a sigmoidoscopy every 5 years or a colonoscopy every 10 years starting at age 33. Hepatitis C blood test. Hepatitis B blood test. Sexually transmitted disease (STD) testing. Diabetes screening. This is done by checking your blood sugar (glucose) after you have not eaten for a while (fasting). You may have this done every 1-3 years. Bone density scan. This is  done to screen for osteoporosis. You may have this done starting at age 2. Mammogram. This may be done every 1-2 years. Talk to your health care provider about how often you should have regular mammograms. Talk with your health care provider about your test results, treatment  options, and if necessary, the need for more tests. Vaccines  Your health care provider may recommend certain vaccines, such as: Influenza vaccine. This is recommended every year. Tetanus, diphtheria, and acellular pertussis (Tdap, Td) vaccine. You may need a Td booster every 10 years. Zoster vaccine. You may need this after age 39. Pneumococcal 13-valent conjugate (PCV13) vaccine. One dose is recommended after age 41. Pneumococcal polysaccharide (PPSV23) vaccine. One dose is recommended after age 67. Talk to your health care provider about which screenings and vaccines you need and how often you need them. This information is not intended to replace advice given to you by your health care provider. Make sure you discuss any questions you have with your health care provider. Document Released: 07/10/2015 Document Revised: 03/02/2016 Document Reviewed: 04/14/2015 Elsevier Interactive Patient Education  2017 [employer].  Fall Prevention in the Home Falls can cause injuries. They can happen to people of all ages. There are many things you can do to make your home safe and to help prevent falls. What can I do on the outside of my home? Regularly fix the edges of walkways and driveways and fix any cracks. Remove anything that might make you trip as you walk through a door, such as a raised step or threshold. Trim any bushes or trees on the path to your home. Use bright outdoor lighting. Clear any walking paths of anything that might make someone trip, such as rocks or tools. Regularly check to see if handrails are loose or broken. Make sure that both sides of any steps have handrails. Any raised decks and porches should have guardrails on the edges. Have any leaves, snow, or ice cleared regularly. Use sand or salt on walking paths during winter. Clean up any spills in your garage right away. This includes oil or grease spills. What can I do in the bathroom? Use night lights. Install grab  bars by the toilet and in the tub and shower. Do not use towel bars as grab bars. Use non-skid mats or decals in the tub or shower. If you need to sit down in the shower, use a plastic, non-slip stool. Keep the floor dry. Clean up any water that spills on the floor as soon as it happens. Remove soap buildup in the tub or shower regularly. Attach bath mats securely with double-sided non-slip rug tape. Do not have throw rugs and other things on the floor that can make you trip. What can I do in the bedroom? Use night lights. Make sure that you have a light by your bed that is easy to reach. Do not use any sheets or blankets that are too big for your bed. They should not hang down onto the floor. Have a firm chair that has side arms. You can use this for support while you get dressed. Do not have throw rugs and other things on the floor that can make you trip. What can I do in the kitchen? Clean up any spills right away. Avoid walking on wet floors. Keep items that you use a lot in easy-to-reach places. If you need to reach something above you, use a strong step stool that has a grab bar. Keep electrical cords out of  the way. Do not use floor polish or wax that makes floors slippery. If you must use wax, use non-skid floor wax. Do not have throw rugs and other things on the floor that can make you trip. What can I do with my stairs? Do not leave any items on the stairs. Make sure that there are handrails on both sides of the stairs and use them. Fix handrails that are broken or loose. Make sure that handrails are as long as the stairways. Check any carpeting to make sure that it is firmly attached to the stairs. Fix any carpet that is loose or worn. Avoid having throw rugs at the top or bottom of the stairs. If you do have throw rugs, attach them to the floor with carpet tape. Make sure that you have a light switch at the top of the stairs and the bottom of the stairs. If you do not have them,  ask someone to add them for you. What else can I do to help prevent falls? Wear shoes that: Do not have high heels. Have rubber bottoms. Are comfortable and fit you well. Are closed at the toe. Do not wear sandals. If you use a stepladder: Make sure that it is fully opened. Do not climb a closed stepladder. Make sure that both sides of the stepladder are locked into place. Ask someone to hold it for you, if possible. Clearly mark and make sure that you can see: Any grab bars or handrails. First and last steps. Where the edge of each step is. Use tools that help you move around (mobility aids) if they are needed. These include: Canes. Walkers. Scooters. Crutches. Turn on the lights when you go into a dark area. Replace any light bulbs as soon as they burn out. Set up your furniture so you have a clear path. Avoid moving your furniture around. If any of your floors are uneven, fix them. If there are any pets around you, be aware of where they are. Review your medicines with your doctor. Some medicines can make you feel dizzy. This can increase your chance of falling. Ask your doctor what other things that you can do to help prevent falls. This information is not intended to replace advice given to you by your health care provider. Make sure you discuss any questions you have with your health care provider. Document Released: 04/09/2009 Document Revised: 11/19/2015 Document Reviewed: 07/18/2014 Elsevier Interactive Patient Education  2017 [employer].

## 2023-01-25 ENCOUNTER — Encounter: Payer: Self-pay | Admitting: Internal Medicine

## 2023-01-25 NOTE — Progress Notes (Signed)
See visit note from nephrologist Dr. Perrin Smack.  Patient seen 01/10/2023.  He is concerned about her resistant hypertension despite 4 medications.  Currently on valsartan/HCTZ 160 mg/25 mg daily, Norvasc 10 mg daily, hydralazine 100 mg twice a day and 1/2 tab at noon, Coreg.  He thought that the patient is off HCTZ.  If she is, he recommends restarting it.  However patient is on Diovan/HCTZ.[name]

## 2023-01-30 ENCOUNTER — Other Ambulatory Visit: Payer: Self-pay | Admitting: Internal Medicine

## 2023-01-30 DIAGNOSIS — K219 Gastro-esophageal reflux disease without esophagitis: Secondary | ICD-10-CM

## 2023-01-31 NOTE — Telephone Encounter (Signed)
Requested Prescriptions  Pending Prescriptions Disp Refills   pantoprazole (PROTONIX) 40 MG tablet [Pharmacy Med Name: PANTOPRAZOLE SODIUM 40 MG Tablet Delayed Release] 90 tablet 2    Sig: TAKE 1 TABLET EVERY DAY     Gastroenterology: Proton Pump Inhibitors Passed - 01/30/2023 12:54 PM      Passed - Valid encounter within last 12 months    Recent Outpatient Visits           1 month ago Hypertension associated with diabetes [facility])   [place] [facility] [place], Leoti L, RPH-CPP   2 months ago Type 2 diabetes mellitus with morbid obesity [facility])   [place] [facility] Jonah Blue B, MD   5 months ago Type 2 diabetes mellitus with morbid obesity [facility])   [place] [facility] Marcine Matar, MD   6 months ago Community acquired pneumonia of left lower lobe of lung   [place] [facility] Jonah Blue B, MD   6 months ago Type 2 diabetes mellitus with morbid obesity [facility])   [place] [facility] Drucilla Chalet, RPH-CPP       Future Appointments             In 1 month Laural Benes, Binnie Rail, MD [facility]

## 2023-02-01 ENCOUNTER — Ambulatory Visit (AMBULATORY_SURGERY_CENTER): Payer: Medicare PPO

## 2023-02-01 VITALS — Ht 68.0 in | Wt 247.0 lb

## 2023-02-01 DIAGNOSIS — Z1211 Encounter for screening for malignant neoplasm of colon: Secondary | ICD-10-CM

## 2023-02-01 DIAGNOSIS — Z86010 Personal history of colonic polyps: Secondary | ICD-10-CM

## 2023-02-01 MED ORDER — PEG 3350-KCL-NA BICARB-NACL 420 G PO SOLR
4000.0000 mL | Freq: Once | ORAL | 0 refills | Status: AC
Start: 2023-02-01 — End: 2023-02-01

## 2023-02-01 NOTE — Progress Notes (Signed)
No egg or soy allergy known to patient  No issues known to pt with past sedation with any surgeries or procedures Patient denies ever being told they had issues or difficulty with intubation  No FH of Malignant Hyperthermia Pt is not on diet pills Pt is not on  home 02  Pt is not on blood thinners  Pt reports constipation while taking iron takes stool softner OTC as needed No A fib or A flutter Have any cardiac testing pending--no  LOA: independent Prep: Golytely   Patient's chart reviewed by Cathlyn Parsons CNRA prior to previsit and patient appropriate for the [facility].  Previsit completed and red dot placed by patient's name on their procedure day (on provider's schedule).     PV competed with patient. Prep instructions sent via mychart and home address.

## 2023-02-06 ENCOUNTER — Encounter: Payer: Self-pay | Admitting: Internal Medicine

## 2023-02-12 ENCOUNTER — Other Ambulatory Visit: Payer: Self-pay | Admitting: Internal Medicine

## 2023-02-12 DIAGNOSIS — E1159 Type 2 diabetes mellitus with other circulatory complications: Secondary | ICD-10-CM

## 2023-02-14 NOTE — Telephone Encounter (Signed)
Request is too soon, Last refill 07/12/22 for 90 and 2 refills.  Requested Prescriptions  Pending Prescriptions Disp Refills   amLODipine (NORVASC) 10 MG tablet [Pharmacy Med Name: AMLODIPINE BESYLATE 10 MG Tablet] 90 tablet 3    Sig: TAKE 1 TABLET EVERY DAY     Cardiovascular: Calcium Channel Blockers 2 Passed - 02/12/2023  3:52 AM      Passed - Last BP in normal range    BP Readings from Last 1 Encounters:  12/06/22 129/72         Passed - Last Heart Rate in normal range    Pulse Readings from Last 1 Encounters:  12/06/22 69         Passed - Valid encounter within last 6 months    Recent Outpatient Visits           2 months ago Hypertension associated with diabetes [facility])   [place] [facility] [place], Ehrenfeld L, RPH-CPP   3 months ago Type 2 diabetes mellitus with morbid obesity [facility])   [place] [facility] Jonah Blue B, MD   6 months ago Type 2 diabetes mellitus with morbid obesity [facility])   [place] [facility] Marcine Matar, MD   7 months ago Community acquired pneumonia of left lower lobe of lung   [place] [facility] Jonah Blue B, MD   7 months ago Type 2 diabetes mellitus with morbid obesity [facility])   [place] [facility] Drucilla Chalet, RPH-CPP       Future Appointments             In 4 weeks Marcine Matar, MD [facility]

## 2023-02-15 ENCOUNTER — Ambulatory Visit: Payer: Medicare PPO | Attending: Nurse Practitioner | Admitting: Nurse Practitioner

## 2023-02-15 ENCOUNTER — Encounter: Payer: Self-pay | Admitting: Nurse Practitioner

## 2023-02-15 ENCOUNTER — Ambulatory Visit: Payer: Self-pay | Admitting: *Deleted

## 2023-02-15 VITALS — BP 131/72 | HR 85 | Ht 68.0 in | Wt 254.2 lb

## 2023-02-15 DIAGNOSIS — M25561 Pain in right knee: Secondary | ICD-10-CM

## 2023-02-15 NOTE — Progress Notes (Signed)
Assessment & Plan:  [name] was seen today for joint swelling.  Diagnoses and all orders for this visit:  Acute pain of right knee -     DG Knee Complete 4 Views Right; Future RICE conservative therapy XS tylenol or tylenol arthritis. She can not take prescription pain meds due to upcoming colonoscopy COPPER KNEE SLEEVE VOLTAREN GEL   Patient has been counseled on age-appropriate routine health concerns for screening and prevention. These are reviewed and up-to-date. Referrals have been placed accordingly. Immunizations are up-to-date or declined.    Subjective:   Chief Complaint  Patient presents with   Joint Swelling    Right shoulder pain, sharp pain on left side of temple, index finger on left hand gets stuck   HPI [name] 72 y.o. female presents to office today for c/o right knee pain  Right knee pain Onset of the symptoms was several weeks ago. Inciting event: none known. Current symptoms include crepitus sensation, pain located behind the knee and the medial meniscal, popping sensation, stiffness, and swelling. Pain is aggravated by any weight bearing, going up and down stairs, inactivity, rising after sitting, and standing.  Patient has had no prior knee problems. Evaluation to date: none. Treatment to date: avoidance of offending activity and prescription NSAIDS which are somewhat effective.   She also endorses worsening right shoulder pain. However she is currently seeing Dr. Magnus Ivan for this.   Review of Systems  Constitutional:  Negative for fever, malaise/fatigue and weight loss.  HENT: Negative.  Negative for nosebleeds.   Eyes: Negative.  Negative for blurred vision, double vision and photophobia.  Respiratory: Negative.  Negative for cough and shortness of breath.   Cardiovascular: Negative.  Negative for chest pain, palpitations and leg swelling.  Gastrointestinal: Negative.  Negative for heartburn, nausea and vomiting.  Musculoskeletal:  Positive for  joint pain. Negative for myalgias.  Neurological: Negative.  Negative for dizziness, focal weakness, seizures and headaches.  Psychiatric/Behavioral: Negative.  Negative for suicidal ideas.     Past Medical History:  Diagnosis Date   Abscess in epidural space of L2-L5 lumbar spine 03/2006   Anemia, iron deficiency    Arthritis    Breast cancer (HCC) 06/12/2017   right breast   CAD (coronary artery disease), native coronary artery 03/30/2015   Cataract    Colon polyp    a. Multiple colonic polyps status post colonoscopy in October 2007, consistent with tubular adenoma, tubulovillous adenoma with no high-grade dysplasia or malignancy identified.    Constipation    Coronary artery disease    a. s/p CABG 06/2010: S-D2; S-PDA (at time of MV surgery).   Depression    Diabetes mellitus    type II   Family history of breast cancer    Family history of prostate cancer    Genetic testing 09/25/2017   Multi-Cancer panel (83 genes) @ [facility] - No pathogenic mutations detected   GERD (gastroesophageal reflux disease)    Hx of transient ischemic attack (TIA)    a. See stroke section.   Hyperlipidemia    Hypertension    Hypoxia    a. Has history of acute hypoxic respiratory failure in the setting of bronchitis/PNA or prior admissions.   MRSA infection    a. History of recurrent skin infection and soft tissue abscesses, with MRSA positive in the past.   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 mitral valve - s/p resection and MV repair 06/2010 Dr. Cornelius Moras.   Papillary fibroelastoma of  heart 03/30/2015   Personal history of radiation therapy    Sleep apnea    does not use CPAP   Stenosis of middle cerebral artery    a. Distal R MCA.   Stroke [facility])    a. 11/2009: mitral mass diagnosed at this time, also has distal R MCA stenosis, tx with coumadin. b. Readmitted 05/2010 with TIA symptoms - had not been taking Coumadin. s/p MV surgery 06/2010. Coumadin stopped 2013 after review of chart by Dr.  Jens Som since mass was removed (stroke felt possibly related to this).     Past Surgical History:  Procedure Laterality Date   BREAST BIOPSY Right 08/26/2019   benign   BREAST LUMPECTOMY Right    BREAST LUMPECTOMY WITH RADIOACTIVE SEED AND SENTINEL LYMPH NODE BIOPSY Right 07/20/2017   Procedure: RIGHT BREAST LUMPECTOMY WITH RADIOACTIVE SEED AND SENTINEL LYMPH NODE BIOPSY;  Surgeon: Ovidio Kin, MD;  Location: [facility] OR;  Service: General;  Laterality: Right;   CARDIAC CATHETERIZATION  04/03/2015   Procedure: Left Heart Cath and Cors/Grafts Angiography;  Surgeon: Peter M Swaziland, MD;  Location: [facility] INVASIVE CV LAB;  Service: Cardiovascular;;   CORONARY ARTERY BYPASS GRAFT  1/12   history of ankle fractures requiring surgery     LUMBAR LAMINECTOMY/DECOMPRESSION MICRODISCECTOMY Left 04/15/2015   Procedure: LUMBAR LAMINECTOMY/DECOMPRESSION MICRODISCECTOMY 1 LEVEL;  Surgeon: Tressie Stalker, MD;  Location: [facility] NEURO ORS;  Service: Neurosurgery;  Laterality: Left;  Left L23 microdiskectomy   LUMBAR LAMINECTOMY/DECOMPRESSION MICRODISCECTOMY N/A 05/27/2020   Procedure: L2-3, L3-4  decompression;  Surgeon: Eldred Manges, MD;  Location: [facility] OR;  Service: Orthopedics;  Laterality: N/A;   MV repair and resection of mass  1/12   RESECTION DISTAL CLAVICAL Left 05/16/2019   Procedure: RESECTION DISTAL CLAVICAL;  Surgeon: Kathryne Hitch, MD;  Location: [facility];  Service: Orthopedics;  Laterality: Left;   SHOULDER ARTHROSCOPY WITH ROTATOR CUFF REPAIR AND SUBACROMIAL DECOMPRESSION Left 05/16/2019   Procedure: SHOULDER ARTHROSCOPY WITH ROTATOR CUFF REPAIR AND SUBACROMIAL DECOMPRESSION;  Surgeon: Kathryne Hitch, MD;  Location: [facility];  Service: Orthopedics;  Laterality: Left;   TONSILLECTOMY  1971   TOTAL ABDOMINAL HYSTERECTOMY  1978   TRIGGER FINGER RELEASE Right 12/02/2021   Procedure: RIGHT RING FINGER A-1 PULLEY RELEASE;  Surgeon: Kathryne Hitch, MD;   Location:  SURGERY CENTER;  Service: Orthopedics;  Laterality: Right;   VESICOVAGINAL FISTULA CLOSURE W/ TAH      Family History  Problem Relation Age of Onset   Colon cancer Mother        dx 77s; deceased 46   Irritable bowel syndrome Mother    Prostate cancer Father        deceased 45s   Cirrhosis Sister        died of GI bleed associated with cirrhosis of the liver   Stroke Brother    Prostate cancer Brother 54       deceased 63   Diabetes Brother        x3   Kidney disease Brother    Cancer Brother 58       unk. type   Breast cancer Cousin        several maternal 1st and 2nd cousins with breast cancer   Rectal cancer Neg Hx    Stomach cancer Neg Hx    Esophageal cancer Neg Hx     Social History Reviewed with no changes to be made today.   Outpatient Medications Prior to Visit  Medication Sig Dispense Refill   Accu-Chek Softclix Lancets lancets Use as instructed 100 each 12   albuterol (VENTOLIN HFA) 108 (90 Base) MCG/ACT inhaler Inhale 2 puffs into the lungs every 6 (six) hours as needed for wheezing or shortness of breath. 1 each 0   amLODipine (NORVASC) 10 MG tablet TAKE 1 TABLET EVERY DAY 90 tablet 2   aspirin EC 81 MG tablet Take 1 tablet (81 mg total) by mouth daily. 30 tablet 5   atorvastatin (LIPITOR) 80 MG tablet TAKE 1 TABLET EVERY DAY (DOSE INCREASE) (Patient taking differently: Take 80 mg by mouth daily.) 90 tablet 2   buPROPion (WELLBUTRIN XL) 150 MG 24 hr tablet TAKE 1 TABLET TWICE DAILY 180 tablet 1   carvedilol (COREG) 25 MG tablet TAKE 1 TABLET TWICE DAILY 180 tablet 3   diclofenac Sodium (VOLTAREN) 1 % GEL APPLY 2 GRAMS TOPICALLY 4 (FOUR) TIMES DAILY AS NEEDED. 100 g 0   DULoxetine (CYMBALTA) 30 MG capsule Take 1 capsule (30 mg total) by mouth daily. 180 capsule 1   FARXIGA 10 MG TABS tablet Take 10 mg by mouth daily.     ferrous sulfate (FEROSUL) 325 (65 FE) MG tablet TAKE 1 TABLET (325 MG TOTAL) BY MOUTH DAILY WITH BREAKFAST. (Patient taking  differently: Take 325 mg by mouth daily with breakfast.) 90 tablet 1   gabapentin (NEURONTIN) 100 MG capsule Take 1 capsule (100 mg total) by mouth 3 (three) times daily. 90 capsule 5   hydrALAZINE (APRESOLINE) 100 MG tablet 1 tab PO Q a.m, 1/2 tab Po Q noon and 1 tab PO Q p.m 225 tablet 1   insulin glargine (LANTUS) 100 UNIT/ML Solostar Pen Inject 10 Units into the skin at bedtime. (Patient taking differently: Inject 7 Units into the skin at bedtime.) 15 mL 3   Insulin Pen Needle (PEN NEEDLES) 31G X 8 MM MISC UAD 100 each 6   letrozole (FEMARA) 2.5 MG tablet TAKE 1 TABLET EVERY DAY 90 tablet 3   methocarbamol (ROBAXIN) 500 MG tablet TAKE 1 TABLET EVERY 8 HOURS AS NEEDED FOR MUSCLE SPASMS. 270 tablet 0   pantoprazole (PROTONIX) 40 MG tablet TAKE 1 TABLET EVERY DAY 90 tablet 2   Semaglutide,0.25 or 0.5MG /DOS, (OZEMPIC, 0.25 OR 0.5 MG/DOSE,) 2 MG/3ML SOPN Inject 0.5 mg into the skin once a week. 3 mL 2   traMADol (ULTRAM) 50 MG tablet TAKE 1 TABLET EVERY 12 HOURS AS NEEDED . PRESCRIPTION TO LAST 1 MONTH. 40 tablet 0   traZODone (DESYREL) 50 MG tablet Take 1 tablet (50 mg total) by mouth at bedtime as needed. for sleep 90 tablet 1   valsartan-hydrochlorothiazide (DIOVAN-HCT) 160-25 MG tablet TAKE 1 TABLET EVERY DAY 90 tablet 2   acetaminophen (TYLENOL) 325 MG tablet Take 1 tablet (325 mg total) by mouth every 6 (six) hours as needed. (Patient not taking: Reported on 02/01/2023) 30 tablet 2   Ascorbic Acid (VITAMIN C PO) Take 500 mg by mouth daily. (Patient not taking: Reported on 02/01/2023)     Blood Glucose Calibration (TRUE METRIX LEVEL 1) Low SOLN UAD (Patient not taking: Reported on 02/15/2023) 1 each 0   Blood Glucose Monitoring Suppl (ACCU-CHEK AVIVA PLUS) w/Device KIT USE AS DIRECTED (Patient not taking: Reported on 02/15/2023) 1 kit 0   cholecalciferol (VITAMIN D3) 25 MCG (1000 UNIT) tablet Take 1,000 Units by mouth daily. (Patient not taking: Reported on 02/01/2023)     Continuous Glucose Receiver  (FREESTYLE LIBRE 2 READER) DEVI Use to check blood sugar  three times daily. E11.69 (Patient not taking: Reported on 02/15/2023) 1 each 0   Continuous Glucose Sensor (FREESTYLE LIBRE 2 SENSOR) MISC Use to check blood sugar three times daily. Change sensors once every 14 days. E11.69 (Patient not taking: Reported on 02/15/2023) 2 each 6   glucose blood (TRUE METRIX BLOOD GLUCOSE TEST) test strip Use as instructed to check blood sugar1-3 times a day (Patient not taking: Reported on 02/15/2023) 100 each 12   polyethylene glycol powder (GLYCOLAX/MIRALAX) 17 GM/SCOOP powder Take 17 g by mouth daily as needed for mild constipation. (Patient not taking: Reported on 02/01/2023) 3350 g 1   No facility-administered medications prior to visit.    No Known Allergies     Objective:    BP 131/72 (BP Location: Left Arm, Patient Position: Sitting, Cuff Size: Large)   Pulse 85   Ht 5\' 8"  (1.727 m)   Wt 254 lb 3.2 oz (115.3 kg)   SpO2 99%   BMI 38.65 kg/m  Wt Readings from Last 3 Encounters:  02/15/23 254 lb 3.2 oz (115.3 kg)  02/01/23 247 lb (112 kg)  01/24/23 247 lb (112 kg)    Physical Exam Vitals and nursing note reviewed.  Constitutional:      Appearance: She is well-developed.  HENT:     Head: Normocephalic and atraumatic.  Cardiovascular:     Rate and Rhythm: Normal rate and regular rhythm.     Heart sounds: Normal heart sounds. No murmur heard.    No friction rub. No gallop.  Pulmonary:     Effort: Pulmonary effort is normal. No tachypnea or respiratory distress.     Breath sounds: Normal breath sounds. No decreased breath sounds, wheezing, rhonchi or rales.  Chest:     Chest wall: No tenderness.  Abdominal:     General: Bowel sounds are normal.     Palpations: Abdomen is soft.  Musculoskeletal:        General: Normal range of motion.     Cervical back: Normal range of motion.     Right knee: No deformity or effusion. Normal range of motion. Tenderness present over the MCL.  Skin:     General: Skin is warm and dry.  Neurological:     Mental Status: She is alert and oriented to person, place, and time.     Coordination: Coordination normal.  Psychiatric:        Behavior: Behavior normal. Behavior is cooperative.        Thought Content: Thought content normal.        Judgment: Judgment normal.          Patient has been counseled extensively about nutrition and exercise as well as the importance of adherence with medications and regular follow-up. The patient was given clear instructions to go to ER or return to medical center if symptoms don't improve, worsen or new problems develop. The patient verbalized understanding.   Follow-up: Return if symptoms worsen or fail to improve.   Claiborne Rigg, FNP-BC [facility] [place], [place] [phone]   02/15/2023, 4:11 PM

## 2023-02-15 NOTE — Telephone Encounter (Signed)
  Chief Complaint: Swelling of knees Symptoms: Both knees swollen, R>L. States right twice the size of left. Mild redness. 8/10 pain right knee. Difficult to walk. Frequency: "Months but worsening past 2 months, worse today." Pertinent Negatives: Patient denies  Disposition: [] ED /[] Urgent Care (no appt availability in office) / [x] Appointment(In office/virtual)/ []  [facility]/ [] Home Care/ [] Refused Recommended Disposition /[] [place] [facility]/ []  Follow-up with PCP Additional Notes: Appt secured for today. Care advise provided,pt verbalizes understanding. Reason for Disposition  SEVERE swelling (e.g., can't move swollen knee at all)  Answer Assessment - Initial Assessment Questions 1. LOCATION: "Where is the swelling located?"  (e.g., left, right, both knees)     Both knees  R>L 2. ONSET: "When did the swelling start?" "Does it come and go, or is it there all the time?"   2 months worsening 3. SWELLING: "How bad is the swelling?" Or, "How large is it?" (e.g., mild, moderate, severe; size of localized swelling)    - NONE: No joint swelling.   - LOCALIZED: Localized; small area of puffy or swollen skin (e.g., insect bite, skin irritation).   - MILD: Joint looks or feels mildly swollen or puffy.   - MODERATE: Swollen; interferes with normal activities (e.g., work or school); can't move joint normally (bend and straighten completely); may be limping.   - SEVERE: Very swollen; can't move swollen joint at all; limping a lot or unable to walk.     Moderate-severe 4. PAIN: "Is there any pain?" If Yes, ask: "How bad is it?" (Scale 1-10; or mild, moderate, severe)   - NONE (0): no pain.   - MILD (1-3): doesn't interfere with normal activities.    - MODERATE (4-7): interferes with normal activities (e.g., work or school) or awakens from sleep, limping.    - SEVERE (8-10): excruciating pain, unable to do any normal activities, unable to walk.      Mostly in right knee, 8/10 5.  SETTING: "Has there been any recent work, exercise or other activity that involved that part of the body?"      No 6. AGGRAVATING FACTORS: "What makes the knee swelling worse?" (e.g., walking, climbing stairs, running)     Walking, sitting long periods, standing still 7. ASSOCIATED SYMPTOMS: "Is there any pain or redness?"     Pain 8. OTHER SYMPTOMS: "Do you have any other symptoms?" (e.g., chest pain, difficulty breathing, fever, calf pain)     Some redness, pinkish.  Protocols used: Knee Swelling-A-AH

## 2023-02-21 ENCOUNTER — Ambulatory Visit (AMBULATORY_SURGERY_CENTER): Payer: Medicare PPO | Admitting: Internal Medicine

## 2023-02-21 ENCOUNTER — Encounter: Payer: Self-pay | Admitting: Internal Medicine

## 2023-02-21 VITALS — BP 166/73 | HR 75 | Temp 97.3°F | Resp 15 | Ht 68.0 in | Wt 247.0 lb

## 2023-02-21 DIAGNOSIS — D122 Benign neoplasm of ascending colon: Secondary | ICD-10-CM | POA: Diagnosis not present

## 2023-02-21 DIAGNOSIS — D123 Benign neoplasm of transverse colon: Secondary | ICD-10-CM | POA: Diagnosis not present

## 2023-02-21 DIAGNOSIS — D120 Benign neoplasm of cecum: Secondary | ICD-10-CM | POA: Diagnosis not present

## 2023-02-21 DIAGNOSIS — I2510 Atherosclerotic heart disease of native coronary artery without angina pectoris: Secondary | ICD-10-CM | POA: Diagnosis not present

## 2023-02-21 DIAGNOSIS — E119 Type 2 diabetes mellitus without complications: Secondary | ICD-10-CM | POA: Diagnosis not present

## 2023-02-21 DIAGNOSIS — D374 Neoplasm of uncertain behavior of colon: Secondary | ICD-10-CM | POA: Diagnosis not present

## 2023-02-21 DIAGNOSIS — Z86010 Personal history of colonic polyps: Secondary | ICD-10-CM

## 2023-02-21 DIAGNOSIS — G4730 Sleep apnea, unspecified: Secondary | ICD-10-CM | POA: Diagnosis not present

## 2023-02-21 DIAGNOSIS — Z09 Encounter for follow-up examination after completed treatment for conditions other than malignant neoplasm: Secondary | ICD-10-CM | POA: Diagnosis not present

## 2023-02-21 DIAGNOSIS — F32A Depression, unspecified: Secondary | ICD-10-CM | POA: Diagnosis not present

## 2023-02-21 MED ORDER — SODIUM CHLORIDE 0.9 % IV SOLN: 500.0000 mL | Freq: Once | INTRAVENOUS | Status: DC

## 2023-02-21 NOTE — Progress Notes (Signed)
Called to room to assist during endoscopic procedure.  Patient ID and intended procedure confirmed with present staff. Received instructions for my participation in the procedure from the performing physician.  

## 2023-02-21 NOTE — Patient Instructions (Addendum)
-   Resume previous diet. - Continue present medications. - CT scan abd/pelvis. - Await pathology results. - Surgical referral anticipated.   YOU HAD AN ENDOSCOPIC PROCEDURE TODAY AT THE [facility]:   Refer to the procedure report that was given to you for any specific questions about what was found during the examination.  If the procedure report does not answer your questions, please call your gastroenterologist to clarify.  If you requested that your care partner not be given the details of your procedure findings, then the procedure report has been included in a sealed envelope for you to review at your convenience later.  YOU SHOULD EXPECT: Some feelings of bloating in the abdomen. Passage of more gas than usual.  Walking can help get rid of the air that was put into your GI tract during the procedure and reduce the bloating. If you had a lower endoscopy (such as a colonoscopy or flexible sigmoidoscopy) you may notice spotting of blood in your stool or on the toilet paper. If you underwent a bowel prep for your procedure, you may not have a normal bowel movement for a few days.  Please Note:  You might notice some irritation and congestion in your nose or some drainage.  This is from the oxygen used during your procedure.  There is no need for concern and it should clear up in a day or so.  SYMPTOMS TO REPORT IMMEDIATELY:  Following lower endoscopy (colonoscopy or flexible sigmoidoscopy):  Excessive amounts of blood in the stool  Significant tenderness or worsening of abdominal pains  Swelling of the abdomen that is new, acute  Fever of 100F or higher  For urgent or emergent issues, a gastroenterologist can be reached at any hour by calling [phone]. Do not use MyChart messaging for urgent concerns.    DIET:  We do recommend a small meal at first, but then you may proceed to your regular diet.  Drink plenty of fluids but you should avoid alcoholic beverages for 24  hours.  ACTIVITY:  You should plan to take it easy for the rest of today and you should NOT DRIVE or use heavy machinery until tomorrow (because of the sedation medicines used during the test).    FOLLOW UP: Our staff will call the number listed on your records the next business day following your procedure.  We will call around 7:15- 8:00 am to check on you and address any questions or concerns that you may have regarding the information given to you following your procedure. If we do not reach you, we will leave a message.     If any biopsies were taken you will be contacted by phone or by letter within the next 1-3 weeks.  Please call us at [phone] if you have not heard about the biopsies in 3 weeks.    SIGNATURES/CONFIDENTIALITY: You and/or your care partner have signed paperwork which will be entered into your electronic medical record.  These signatures attest to the fact that that the information above on your After Visit Summary has been reviewed and is understood.  Full responsibility of the confidentiality of this discharge information lies with you and/or your care-partner.

## 2023-02-21 NOTE — Progress Notes (Signed)
GASTROENTEROLOGY PROCEDURE H&P NOTE   Primary Care Physician: Marcine Matar, MD    Reason for Procedure:  History of multiple colonic polyps including piecemeal resection of adenoma in 2013; overdue for surveillance  Plan:    Colonoscopy  Patient is appropriate for endoscopic procedure(s) in the ambulatory ([facility]) setting.  The nature of the procedure, as well as the risks, benefits, and alternatives were carefully and thoroughly reviewed with the patient. Ample time for discussion and questions allowed. The patient understood, was satisfied, and agreed to proceed.     HPI: [name] is a 72 y.o. female who presents for colonoscopy.  Medical history as below.  Tolerated the prep.  No recent chest pain or shortness of breath.  No abdominal pain today.  Past Medical History:  Diagnosis Date   Abscess in epidural space of L2-L5 lumbar spine 03/2006   Anemia, iron deficiency    Arthritis    Breast cancer (HCC) 06/12/2017   right breast   CAD (coronary artery disease), native coronary artery 03/30/2015   Cataract    Colon polyp    a. Multiple colonic polyps status post colonoscopy in October 2007, consistent with tubular adenoma, tubulovillous adenoma with no high-grade dysplasia or malignancy identified.    Constipation    Coronary artery disease    a. s/p CABG 06/2010: S-D2; S-PDA (at time of MV surgery).   Depression    Diabetes mellitus    type II   Family history of breast cancer    Family history of prostate cancer    Genetic testing 09/25/2017   Multi-Cancer panel (83 genes) @ [facility] - No pathogenic mutations detected   GERD (gastroesophageal reflux disease)    Hx of transient ischemic attack (TIA)    a. See stroke section.   Hyperlipidemia    Hypertension    Hypoxia    a. Has history of acute hypoxic respiratory failure in the setting of bronchitis/PNA or prior admissions.   MRSA infection    a. History of recurrent skin infection and soft tissue  abscesses, with MRSA positive in the past.   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 mitral valve - s/p resection and MV repair 06/2010 Dr. Cornelius Moras.   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 Distal R MCA.   Stroke [facility])    a. 11/2009: mitral mass diagnosed at this time, also has distal R MCA stenosis, tx with coumadin. b. Readmitted 05/2010 with TIA symptoms - had not been taking Coumadin. s/p MV surgery 06/2010. Coumadin stopped 2013 after review of chart by Dr. Jens Som since mass was removed (stroke felt possibly related to this).     Past Surgical History:  Procedure Laterality Date   BREAST BIOPSY Right 08/26/2019   benign   BREAST LUMPECTOMY Right    BREAST LUMPECTOMY WITH RADIOACTIVE SEED AND SENTINEL LYMPH NODE BIOPSY Right 07/20/2017   Procedure: RIGHT BREAST LUMPECTOMY WITH RADIOACTIVE SEED AND SENTINEL LYMPH NODE BIOPSY;  Surgeon: Ovidio Kin, MD;  Location: [facility] OR;  Service: General;  Laterality: Right;   CARDIAC CATHETERIZATION  04/03/2015   Procedure: Left Heart Cath and Cors/Grafts Angiography;  Surgeon: Peter M Swaziland, MD;  Location: [facility] INVASIVE CV LAB;  Service: Cardiovascular;;   CORONARY ARTERY BYPASS GRAFT  1/12   history of ankle fractures requiring surgery  LUMBAR LAMINECTOMY/DECOMPRESSION MICRODISCECTOMY Left 04/15/2015   Procedure: LUMBAR LAMINECTOMY/DECOMPRESSION MICRODISCECTOMY 1 LEVEL;  Surgeon: Tressie Stalker, MD;  Location: [facility]URO ORS;  Service: Neurosurgery;  Laterality: Left;  Left L23 microdiskectomy   LUMBAR LAMINECTOMY/DECOMPRESSION MICRODISCECTOMY N/A 05/27/2020   Procedure: L2-3, L3-4  decompression;  Surgeon: Eldred Manges, MD;  Location: [facility] OR;  Service: Orthopedics;  Laterality: N/A;   MV repair and resection of mass  1/12   RESECTION DISTAL CLAVICAL Left 05/16/2019   Procedure: RESECTION DISTAL CLAVICAL;  Surgeon: Kathryne Hitch, MD;  Location: [facility];  Service: Orthopedics;  Laterality: Left;   SHOULDER ARTHROSCOPY WITH ROTATOR CUFF REPAIR AND SUBACROMIAL DECOMPRESSION Left 05/16/2019   Procedure: SHOULDER ARTHROSCOPY WITH ROTATOR CUFF REPAIR AND SUBACROMIAL DECOMPRESSION;  Surgeon: Kathryne Hitch, MD;  Location: [facility];  Service: Orthopedics;  Laterality: Left;   TONSILLECTOMY  1971   TOTAL ABDOMINAL HYSTERECTOMY  1978   TRIGGER FINGER RELEASE Right 12/02/2021   Procedure: RIGHT RING FINGER A-1 PULLEY RELEASE;  Surgeon: Kathryne Hitch, MD;  Location: [facility];  Service: Orthopedics;  Laterality: Right;   VESICOVAGINAL FISTULA CLOSURE W/ TAH      Prior to Admission medications   Medication Sig Start Date End Date Taking? Authorizing Provider  Accu-Chek Softclix Lancets lancets Use as instructed 07/30/20  Yes Marcine Matar, MD  Blood Glucose Calibration (TRUE METRIX LEVEL 1) Low SOLN UAD 12/21/22  Yes Marcine Matar, MD  Blood Glucose Monitoring Suppl (ACCU-CHEK AVIVA PLUS) w/Device KIT USE AS DIRECTED 05/31/21  Yes Marcine Matar, MD  buPROPion (WELLBUTRIN XL) 150 MG 24 hr tablet TAKE 1 TABLET TWICE DAILY 08/04/22  Yes Marcine Matar, MD  carvedilol (COREG) 25 MG tablet TAKE 1 TABLET TWICE DAILY 09/19/22  Yes Turner, Cornelious Bryant, MD  glucose blood (TRUE METRIX BLOOD GLUCOSE TEST) test strip Use as instructed to check blood sugar1-3 times a day 12/21/22  Yes Marcine Matar, MD  hydrALAZINE (APRESOLINE) 100 MG tablet 1 tab PO Q a.m, 1/2 tab Po Q noon and 1 tab PO Q p.m 11/07/22  Yes Marcine Matar, MD  traZODone (DESYREL) 50 MG tablet Take 1 tablet (50 mg total) by mouth at bedtime as needed. for sleep 10/14/22  Yes Marcine Matar, MD  acetaminophen (TYLENOL) 325 MG tablet Take 1 tablet (325 mg total) by mouth every 6 (six) hours as needed. Patient not taking: Reported on 02/01/2023 01/12/22   Rising, Lurena Joiner, PA-C   albuterol (VENTOLIN HFA) 108 (90 Base) MCG/ACT inhaler Inhale 2 puffs into the lungs every 6 (six) hours as needed for wheezing or shortness of breath. 07/12/22   Marcine Matar, MD  amLODipine (NORVASC) 10 MG tablet TAKE 1 TABLET EVERY DAY 07/12/22   Marcine Matar, MD  Ascorbic Acid (VITAMIN C PO) Take 500 mg by mouth daily. Patient not taking: Reported on 02/01/2023    [provider]  aspirin EC 81 MG tablet Take 1 tablet (81 mg total) by mouth daily. 06/26/14   Ambrose Finland, NP  atorvastatin (LIPITOR) 80 MG tablet TAKE 1 TABLET EVERY DAY (DOSE INCREASE) Patient taking differently: Take 80 mg by mouth daily. 04/11/22   Marcine Matar, MD  cholecalciferol (VITAMIN D3) 25 MCG (1000 UNIT) tablet Take 1,000 Units by mouth daily. Patient not taking: Reported on 02/01/2023    [provider]  Continuous Glucose Receiver (FREESTYLE LIBRE 2 READER) DEVI Use to check blood sugar three times daily.  E11.69 Patient not taking: Reported on 02/15/2023 11/07/22   Marcine Matar, MD  Continuous Glucose Sensor (FREESTYLE LIBRE 2 SENSOR) MISC Use to check blood sugar three times daily. Change sensors once every 14 days. E11.69 Patient not taking: Reported on 02/15/2023 11/07/22   Marcine Matar, MD  diclofenac Sodium (VOLTAREN) 1 % GEL APPLY 2 GRAMS TOPICALLY 4 (FOUR) TIMES DAILY AS NEEDED. 08/10/22   Marcine Matar, MD  DULoxetine (CYMBALTA) 30 MG capsule Take 1 capsule (30 mg total) by mouth daily. 01/11/23   Levert Feinstein, MD  FARXIGA 10 MG TABS tablet Take 10 mg by mouth daily. 10/07/22   [provider]  ferrous sulfate (FEROSUL) 325 (65 FE) MG tablet TAKE 1 TABLET (325 MG TOTAL) BY MOUTH DAILY WITH BREAKFAST. Patient taking differently: Take 325 mg by mouth daily with breakfast. 04/12/22   Marcine Matar, MD  gabapentin (NEURONTIN) 100 MG capsule Take 1 capsule (100 mg total) by mouth 3 (three) times daily. 08/10/22   Marcine Matar, MD  insulin glargine  (LANTUS) 100 UNIT/ML Solostar Pen Inject 10 Units into the skin at bedtime. Patient taking differently: Inject 7 Units into the skin at bedtime. 04/11/22   Marcine Matar, MD  Insulin Pen Needle (PEN NEEDLES) 31G X 8 MM MISC UAD 04/07/21   Marcine Matar, MD  letrozole [facility]) 2.5 MG tablet TAKE 1 TABLET EVERY DAY 12/01/22   Serena Croissant, MD  methocarbamol (ROBAXIN) 500 MG tablet TAKE 1 TABLET EVERY 8 HOURS AS NEEDED FOR MUSCLE SPASMS. 12/07/22   Marcine Matar, MD  pantoprazole (PROTONIX) 40 MG tablet TAKE 1 TABLET EVERY DAY 01/31/23   Marcine Matar, MD  polyethylene glycol powder (GLYCOLAX/MIRALAX) 17 GM/SCOOP powder Take 17 g by mouth daily as needed for mild constipation. Patient not taking: Reported on 02/01/2023 08/09/22   Marcine Matar, MD  Semaglutide,0.25 or 0.5MG /DOS, (OZEMPIC, 0.25 OR 0.5 MG/DOSE,) 2 MG/3ML SOPN Inject 0.5 mg into the skin once a week. 10/14/22   Marcine Matar, MD  traMADol (ULTRAM) 50 MG tablet TAKE 1 TABLET EVERY 12 HOURS AS NEEDED . PRESCRIPTION TO LAST 1 MONTH. 12/08/22   Marcine Matar, MD  valsartan-hydrochlorothiazide (DIOVAN-HCT) 160-25 MG tablet TAKE 1 TABLET EVERY DAY 07/12/22   Marcine Matar, MD    Current Outpatient Medications  Medication Sig Dispense Refill   Accu-Chek Softclix Lancets lancets Use as instructed 100 each 12   Blood Glucose Calibration (TRUE METRIX LEVEL 1) Low SOLN UAD 1 each 0   Blood Glucose Monitoring Suppl (ACCU-CHEK AVIVA PLUS) w/Device KIT USE AS DIRECTED 1 kit 0   buPROPion (WELLBUTRIN XL) 150 MG 24 hr tablet TAKE 1 TABLET TWICE DAILY 180 tablet 1   carvedilol (COREG) 25 MG tablet TAKE 1 TABLET TWICE DAILY 180 tablet 3   glucose blood (TRUE METRIX BLOOD GLUCOSE TEST) test strip Use as instructed to check blood sugar1-3 times a day 100 each 12   hydrALAZINE (APRESOLINE) 100 MG tablet 1 tab PO Q a.m, 1/2 tab Po Q noon and 1 tab PO Q p.m 225 tablet 1   traZODone (DESYREL) 50 MG tablet Take 1 tablet (50 mg  total) by mouth at bedtime as needed. for sleep 90 tablet 1   acetaminophen (TYLENOL) 325 MG tablet Take 1 tablet (325 mg total) by mouth every 6 (six) hours as needed. (Patient not taking: Reported on 02/01/2023) 30 tablet 2   albuterol (VENTOLIN HFA) 108 (90 Base) MCG/ACT inhaler Inhale 2  puffs into the lungs every 6 (six) hours as needed for wheezing or shortness of breath. 1 each 0   amLODipine (NORVASC) 10 MG tablet TAKE 1 TABLET EVERY DAY 90 tablet 2   Ascorbic Acid (VITAMIN C PO) Take 500 mg by mouth daily. (Patient not taking: Reported on 02/01/2023)     aspirin EC 81 MG tablet Take 1 tablet (81 mg total) by mouth daily. 30 tablet 5   atorvastatin (LIPITOR) 80 MG tablet TAKE 1 TABLET EVERY DAY (DOSE INCREASE) (Patient taking differently: Take 80 mg by mouth daily.) 90 tablet 2   cholecalciferol (VITAMIN D3) 25 MCG (1000 UNIT) tablet Take 1,000 Units by mouth daily. (Patient not taking: Reported on 02/01/2023)     Continuous Glucose Receiver (FREESTYLE LIBRE 2 READER) DEVI Use to check blood sugar three times daily. E11.69 (Patient not taking: Reported on 02/15/2023) 1 each 0   Continuous Glucose Sensor (FREESTYLE LIBRE 2 SENSOR) MISC Use to check blood sugar three times daily. Change sensors once every 14 days. E11.69 (Patient not taking: Reported on 02/15/2023) 2 each 6   diclofenac Sodium (VOLTAREN) 1 % GEL APPLY 2 GRAMS TOPICALLY 4 (FOUR) TIMES DAILY AS NEEDED. 100 g 0   DULoxetine (CYMBALTA) 30 MG capsule Take 1 capsule (30 mg total) by mouth daily. 180 capsule 1   FARXIGA 10 MG TABS tablet Take 10 mg by mouth daily.     ferrous sulfate (FEROSUL) 325 (65 FE) MG tablet TAKE 1 TABLET (325 MG TOTAL) BY MOUTH DAILY WITH BREAKFAST. (Patient taking differently: Take 325 mg by mouth daily with breakfast.) 90 tablet 1   gabapentin (NEURONTIN) 100 MG capsule Take 1 capsule (100 mg total) by mouth 3 (three) times daily. 90 capsule 5   insulin glargine (LANTUS) 100 UNIT/ML Solostar Pen Inject 10 Units into  the skin at bedtime. (Patient taking differently: Inject 7 Units into the skin at bedtime.) 15 mL 3   Insulin Pen Needle (PEN NEEDLES) 31G X 8 MM MISC UAD 100 each 6   letrozole (FEMARA) 2.5 MG tablet TAKE 1 TABLET EVERY DAY 90 tablet 3   methocarbamol (ROBAXIN) 500 MG tablet TAKE 1 TABLET EVERY 8 HOURS AS NEEDED FOR MUSCLE SPASMS. 270 tablet 0   pantoprazole (PROTONIX) 40 MG tablet TAKE 1 TABLET EVERY DAY 90 tablet 2   polyethylene glycol powder (GLYCOLAX/MIRALAX) 17 GM/SCOOP powder Take 17 g by mouth daily as needed for mild constipation. (Patient not taking: Reported on 02/01/2023) 3350 g 1   Semaglutide,0.25 or 0.5MG /DOS, (OZEMPIC, 0.25 OR 0.5 MG/DOSE,) 2 MG/3ML SOPN Inject 0.5 mg into the skin once a week. 3 mL 2   traMADol (ULTRAM) 50 MG tablet TAKE 1 TABLET EVERY 12 HOURS AS NEEDED . PRESCRIPTION TO LAST 1 MONTH. 40 tablet 0   valsartan-hydrochlorothiazide (DIOVAN-HCT) 160-25 MG tablet TAKE 1 TABLET EVERY DAY 90 tablet 2   Current Facility-Administered Medications  Medication Dose Route Frequency Provider Last Rate Last Admin   0.9 %  sodium chloride infusion  500 mL Intravenous Once Renette Hsu, Carie Caddy, MD        Allergies as of 02/21/2023   (No Known Allergies)    Family History  Problem Relation Age of Onset   Colon cancer Mother        dx 58s; deceased 53   Irritable bowel syndrome Mother    Prostate cancer Father        deceased 67s   Cirrhosis Sister        died of GI  bleed associated with cirrhosis of the liver   Stroke Brother    Prostate cancer Brother 20       deceased 74   Diabetes Brother        x3   Kidney disease Brother    Cancer Brother 75       unk. type   Breast cancer Cousin        several maternal 1st and 2nd cousins with breast cancer   Rectal cancer Neg Hx    Stomach cancer Neg Hx    Esophageal cancer Neg Hx     Social History   Socioeconomic History   Marital status: Married    Spouse name: Not on file   Number of children: 3   Years of  education: some college   Highest education level: Not on file  Occupational History    Employer: UNEMPLOYED  Tobacco Use   Smoking status: Former    Current packs/day: 0.00    Types: Cigarettes    Quit date: 10/14/2010    Years since quitting: 12.3   Smokeless tobacco: Never  Vaping Use   Vaping status: Never Used  Substance and Sexual Activity   Alcohol use: Yes    Comment: social   Drug use: No   Sexual activity: Not Currently  Other Topics Concern   Not on file  Social History Narrative   Lives with husband, stay at home, uses cane occasionally, still active/ambulatory.    Left-handed.   No daily caffeine use.   Social Determinants of Health   Financial Resource Strain: Low Risk  (01/24/2023)   Overall Financial Resource Strain (CARDIA)    Difficulty of Paying Living Expenses: Not hard at all  Food Insecurity: No Food Insecurity (01/24/2023)   Hunger Vital Sign    Worried About Running Out of Food in the Last Year: Never true    Ran Out of Food in the Last Year: Never true  Transportation Needs: No Transportation Needs (01/24/2023)   PRAPARE - Administrator, Civil Service (Medical): No    Lack of Transportation (Non-Medical): No  Physical Activity: Insufficiently Active (01/24/2023)   Exercise Vital Sign    Days of Exercise per Week: 3 days    Minutes of Exercise per Session: 30 min  Stress: No Stress Concern Present (01/24/2023)   [employer] of Occupational Health - Occupational Stress Questionnaire    Feeling of Stress : Not at all  Social Connections: Moderately Integrated (01/24/2023)   Social Connection and Isolation Panel [NHANES]    Frequency of Communication with Friends and Family: More than three times a week    Frequency of Social Gatherings with Friends and Family: Three times a week    Attends Religious Services: More than 4 times per year    Active Member of Clubs or Organizations: No    Attends Banker Meetings: Never     Marital Status: Married  Catering manager Violence: Not At Risk (01/24/2023)   Humiliation, Afraid, Rape, and Kick questionnaire    Fear of Current or Ex-Partner: No    Emotionally Abused: No    Physically Abused: No    Sexually Abused: No    Physical Exam: Vital signs in last 24 hours: @BP  (!) 166/74   Pulse 80   Temp (!) 97.3 F (36.3 C)   Ht 5\' 8"  (1.727 m)   Wt 247 lb (112 kg)   SpO2 100%   BMI 37.56 kg/m  GEN: NAD EYE: Sclerae  anicteric ENT: MMM CV: Non-tachycardic Pulm: CTA b/l GI: Soft, NT/ND NEURO:  Alert & Oriented x 3   Erick Blinks, MD  Gastroenterology  02/21/2023 10:42 AM

## 2023-02-21 NOTE — Progress Notes (Signed)
To pacu, VSS. Report to [email or web address] 

## 2023-02-21 NOTE — Op Note (Signed)
[facility] Patient Name: [name] Procedure Date: 02/21/2023 10:49 AM MRN: [id number] Endoscopist: Beverley Fiedler , MD, [id number] Age: 72 Referring MD:  Date of Birth: Jul 20, 1950 Gender: Female Account #: [id number] Procedure:                Colonoscopy Indications:              High risk colon cancer surveillance: Personal                            history of adenoma (10 mm or greater in size), Last                            colonoscopy: May 2013 (piecemeal removal of a                            distal ascending colon adenoma with tattoo                            placement, 50-month recall colonoscopy recommended                            at that time) Medicines:                Monitored Anesthesia Care Procedure:                Pre-Anesthesia Assessment:                           - Prior to the procedure, a History and Physical                            was performed, and patient medications and                            allergies were reviewed. The patient's tolerance of                            previous anesthesia was also reviewed. The risks                            and benefits of the procedure and the sedation                            options and risks were discussed with the patient.                            All questions were answered, and informed consent                            was obtained. Prior Anticoagulants: The patient has                            taken no anticoagulant or antiplatelet agents. ASA  Grade Assessment: III - A patient with severe                            systemic disease. After reviewing the risks and                            benefits, the patient was deemed in satisfactory                            condition to undergo the procedure.                           After obtaining informed consent, the colonoscope                            was passed under direct vision. Throughout the                             procedure, the patient's blood pressure, pulse, and                            oxygen saturations were monitored continuously. The                            Olympus Scope SN [id number] was introduced through the                            anus and advanced to the terminal ileum. The                            colonoscopy was performed without difficulty. The                            patient tolerated the procedure well. The quality                            of the bowel preparation was good. The terminal                            ileum, ileocecal valve, appendiceal orifice, and                            rectum and the ileocecal valve, appendiceal                            orifice, and rectum were photographed. Scope In: 10:56:20 AM Scope Out: 11:17:58 AM Scope Withdrawal Time: 0 hours 19 minutes 21 seconds  Total Procedure Duration: 0 hours 21 minutes 38 seconds  Findings:                 The digital rectal exam was normal.                           The terminal ileum appeared normal.  Two sessile polyps were found in the cecum. The                            polyps were 3 to 5 mm in size. These polyps were                            removed with a cold snare. Resection and retrieval                            were complete.                           A frond-like/villous non-obstructing large mass was                            found in the ascending colon. The mass was                            partially circumferential (involving one-half of                            the lumen circumference). No bleeding was present.                            Biopsies were taken with a cold forceps for                            histology. Area distal was tattooed with injections                            on opposite walls totaling 3 mL of Spot (carbon                            black).                           A 3 mm polyp was found in the proximal  transverse                            colon. The polyp was sessile. The polyp was removed                            with a cold biopsy forceps. Resection and retrieval                            were complete.                           Multiple small-mouthed diverticula were found in                            the sigmoid colon.  The retroflexed view of the distal rectum and anal                            verge was normal and showed no anal or rectal                            abnormalities. Complications:            No immediate complications. Estimated Blood Loss:     Estimated blood loss was minimal. Impression:               - The examined portion of the ileum was normal.                           - Two 3 to 5 mm polyps in the cecum, removed with a                            cold snare. Resected and retrieved.                           - Rule out malignancy, tumor in the ascending                            colon. Biopsied. Tattooed.                           - One 3 mm polyp in the proximal transverse colon,                            removed with a cold biopsy forceps. Resected and                            retrieved.                           - Mild diverticulosis in the sigmoid colon.                           - The distal rectum and anal verge are normal on                            retroflexion view. Recommendation:           - Patient has a contact number available for                            emergencies. The signs and symptoms of potential                            delayed complications were discussed with the                            patient. Return to normal activities tomorrow.  Written discharge instructions were provided to the                            patient.                           - Resume previous diet.                           - Continue present medications.                           - CT scan  abd/pelvis.                           - Await pathology results.                           - Surgical referral anticipated. Beverley Fiedler, MD 02/21/2023 11:28:54 AM This report has been signed electronically.

## 2023-02-22 ENCOUNTER — Other Ambulatory Visit: Payer: Self-pay

## 2023-02-22 ENCOUNTER — Telehealth: Payer: Self-pay | Admitting: *Deleted

## 2023-02-22 ENCOUNTER — Telehealth: Payer: Self-pay

## 2023-02-22 DIAGNOSIS — K6389 Other specified diseases of intestine: Secondary | ICD-10-CM

## 2023-02-22 NOTE — Telephone Encounter (Signed)
Spoke with [employer], no current labs. Discussed that pt lives in [place]. Placed istat order so that can be drawn the day of scan.

## 2023-02-22 NOTE — Telephone Encounter (Signed)
  Follow up Call-     02/21/2023   10:06 AM  Call back number  Post procedure Call Back phone  # [phone]  Permission to leave phone message Yes     Patient questions:  Do you have a fever, pain , or abdominal swelling? No. Pain Score  0 *  Have you tolerated food without any problems? Yes.    Have you been able to return to your normal activities? Yes.    Do you have any questions about your discharge instructions: Diet   No. Medications  No. Follow up visit  No.  Do you have questions or concerns about your Care? No.  Actions: * If pain score is 4 or above: No action needed, pain <4.

## 2023-02-22 NOTE — Telephone Encounter (Signed)
Inbound call from [facility] stating they need liver lab results for patient faxed over to [phone] prior to appointment. Please advise, thank you.

## 2023-02-22 NOTE — Telephone Encounter (Signed)
Pt scheduled for CT of A/P at [facility] 8/30@10 :45am. Pt to have nothing to eat after 9am, she is aware of appt.

## 2023-02-23 ENCOUNTER — Other Ambulatory Visit (INDEPENDENT_AMBULATORY_CARE_PROVIDER_SITE_OTHER): Payer: Medicare PPO

## 2023-02-23 ENCOUNTER — Telehealth: Payer: Self-pay

## 2023-02-23 ENCOUNTER — Other Ambulatory Visit: Payer: Self-pay

## 2023-02-23 DIAGNOSIS — K6389 Other specified diseases of intestine: Secondary | ICD-10-CM | POA: Diagnosis not present

## 2023-02-23 LAB — BUN: BUN: 25 mg/dL — ABNORMAL HIGH (ref 6–23)

## 2023-02-23 LAB — CREATININE, SERUM: Creatinine, Ser: 1.9 mg/dL — ABNORMAL HIGH (ref 0.40–1.20)

## 2023-02-23 NOTE — Telephone Encounter (Signed)
Spoke with pt and she will come either today or tomorrow for bun and creatinine prior to CT scan on Sat. Orders in epic.

## 2023-02-24 ENCOUNTER — Other Ambulatory Visit (HOSPITAL_BASED_OUTPATIENT_CLINIC_OR_DEPARTMENT_OTHER): Payer: Medicare PPO

## 2023-02-24 ENCOUNTER — Ambulatory Visit: Payer: Medicare PPO | Admitting: Pharmacist

## 2023-02-24 DIAGNOSIS — E1142 Type 2 diabetes mellitus with diabetic polyneuropathy: Secondary | ICD-10-CM

## 2023-02-24 DIAGNOSIS — E1159 Type 2 diabetes mellitus with other circulatory complications: Secondary | ICD-10-CM

## 2023-02-24 DIAGNOSIS — Z7984 Long term (current) use of oral hypoglycemic drugs: Secondary | ICD-10-CM

## 2023-02-24 DIAGNOSIS — I152 Hypertension secondary to endocrine disorders: Secondary | ICD-10-CM

## 2023-02-24 DIAGNOSIS — Z794 Long term (current) use of insulin: Secondary | ICD-10-CM

## 2023-02-24 DIAGNOSIS — Z79899 Other long term (current) drug therapy: Secondary | ICD-10-CM

## 2023-02-24 MED ORDER — ACCU-CHEK SOFTCLIX LANCETS MISC
6 refills | Status: AC
Start: 2023-02-24 — End: ?

## 2023-02-24 MED ORDER — VALSARTAN-HYDROCHLOROTHIAZIDE 160-25 MG PO TABS
1.0000 | ORAL_TABLET | Freq: Every day | ORAL | 2 refills | Status: DC
Start: 2023-02-24 — End: 2023-06-29

## 2023-02-24 MED ORDER — ACCU-CHEK GUIDE W/DEVICE KIT
PACK | 0 refills | Status: AC
Start: 2023-02-24 — End: ?

## 2023-02-24 MED ORDER — ACCU-CHEK GUIDE VI STRP
ORAL_STRIP | 6 refills | Status: AC
Start: 2023-02-24 — End: ?

## 2023-02-24 NOTE — Progress Notes (Signed)
   02/24/2023 Name: [name] MRN: [id number] DOB: 1950/12/28  No chief complaint on file.   [name] is a 72 y.o. year old female who presented for a telephone visit.   They were referred to the pharmacist by a quality report for assistance in managing diabetes and medication access.    Subjective:  Care Team: Primary Care Provider: Marcine Matar, MD ; Next Scheduled Visit: 03/14/2023  Medication Access/Adherence  Current Pharmacy:  [facility] - [place], [place] - [address] [place] [address] Phone: [phone] Fax: [phone]  [facility] [address] [place] ([place]), [place] - [address] [place]) [place] [address] Phone: [phone] Fax: [phone]  [facility] [address] [place], [place] - [address] [place] [address] Phone: [phone] Fax: [phone]   Patient reports affordability concerns with their medications: Yes  - in coverage gap and Ozempic is cost prohibitive. Still taking Lantus and Farxiga as prescribed.  Patient reports access/transportation concerns to their pharmacy: No  Patient reports adherence concerns with their medications:  No     Medication Management:  Current adherence strategy: appropriate. Uses [facility]  Patient reports Good adherence to medications  Patient reports the following barriers to adherence: gap coverage/high copays  Recent fill dates:  Ozempic Last fill date: 01/12/2023 for a 28-ds.   Objective:  Lab Results  Component Value Date   HGBA1C 6.4 11/07/2022    Lab Results  Component Value Date   CREATININE 1.90 (H) 02/23/2023   BUN 25 (H) 02/23/2023   NA 137 06/24/2022   K 5.1 06/24/2022   CL 112 (H) 06/24/2022   CO2 21 (L) 06/24/2022    Lab Results  Component Value Date   CHOL 204 (H) 11/07/2022   HDL 98 11/07/2022   LDLCALC 87 11/07/2022   LDLDIRECT 101.7  07/18/2011   TRIG 114 11/07/2022   CHOLHDL 2.1 11/07/2022    Medications Reviewed Today   Medications were not reviewed in this encounter       Assessment/Plan:   Medication Management: - Currently strategy sufficient to maintain appropriate adherence to prescribed medication regimen. Has not taken the Ozempic in ~4 weeks with good blood sugar control on Lantus and Farxiga. Will take Ozempic off of list for now.  - Suggested use of weekly pill box to organize medications - Created list of medication, indication, and administration time. Provided to patient  Pharmacy Quality Measure Review This patient is appearing on a report for being at risk of failing the adherence measure for diabetes medications this calendar year. Will take Ozempic off of her list given that she is having good control on Lantus and Farxiga only.    Pharmacy Quality Measure Review  This patient is appearing on a report for being at risk of failing the adherence measure for hypertension (ACEi/ARB) medications this calendar year.   Medication: valsartan-HCTZ Last fill date: 07/13/2022 for 90 day supply Sending refill for her now.    Follow Up Plan: in 3 weeks with PCP.  Butch Penny, PharmD, Patsy Baltimore, CPP Clinical Pharmacist [facility] [phone]

## 2023-02-25 ENCOUNTER — Ambulatory Visit (HOSPITAL_BASED_OUTPATIENT_CLINIC_OR_DEPARTMENT_OTHER): Admission: RE | Admit: 2023-02-25 | Payer: Medicare PPO | Source: Ambulatory Visit

## 2023-03-01 ENCOUNTER — Other Ambulatory Visit: Payer: Self-pay

## 2023-03-01 DIAGNOSIS — K6389 Other specified diseases of intestine: Secondary | ICD-10-CM

## 2023-03-02 ENCOUNTER — Ambulatory Visit: Payer: Medicare PPO | Admitting: Neurology

## 2023-03-08 ENCOUNTER — Other Ambulatory Visit: Payer: Self-pay | Admitting: Internal Medicine

## 2023-03-08 ENCOUNTER — Ambulatory Visit (HOSPITAL_BASED_OUTPATIENT_CLINIC_OR_DEPARTMENT_OTHER)
Admission: RE | Admit: 2023-03-08 | Discharge: 2023-03-08 | Disposition: A | Discharge status: Home / Self Care | Payer: Medicare PPO | Source: Ambulatory Visit | Attending: Internal Medicine | Admitting: Internal Medicine

## 2023-03-08 DIAGNOSIS — K5730 Diverticulosis of large intestine without perforation or abscess without bleeding: Secondary | ICD-10-CM | POA: Diagnosis not present

## 2023-03-08 DIAGNOSIS — K6389 Other specified diseases of intestine: Secondary | ICD-10-CM | POA: Diagnosis not present

## 2023-03-08 DIAGNOSIS — N2889 Other specified disorders of kidney and ureter: Secondary | ICD-10-CM | POA: Diagnosis not present

## 2023-03-08 DIAGNOSIS — N83201 Unspecified ovarian cyst, right side: Secondary | ICD-10-CM | POA: Diagnosis not present

## 2023-03-08 NOTE — Telephone Encounter (Unsigned)
Copied from CRM [id number]. Topic: General - Other >> Mar 08, 2023 11:08 AM Everette C wrote: Reason for CRM: Medication Refill - Medication: insulin glargine (LANTUS) 100 UNIT/ML Solostar Pen [[id number]]  Has the patient contacted their pharmacy? Yes.   (Agent: If no, request that the patient contact the pharmacy for the refill. If patient does not wish to contact the pharmacy document the reason why and proceed with request.) (Agent: If yes, when and what did the pharmacy advise?)  Preferred Pharmacy (with phone number or street name): [facility] [address] [place], [place] - [address] [place] [address] Phone: [phone] Fax: [phone] Hours: Not open 24 hours   Has the patient been seen for an appointment in the last year OR does the patient have an upcoming appointment? Yes.    Agent: Please be advised that RX refills may take up to 3 business days. We ask that you follow-up with your pharmacy.

## 2023-03-09 MED ORDER — INSULIN GLARGINE 100 UNIT/ML SOLOSTAR PEN: 10.0000 [IU] | PEN_INJECTOR | Freq: Every day | SUBCUTANEOUS | 3 refills | Status: DC

## 2023-03-09 NOTE — Telephone Encounter (Signed)
Requested medication (s) are due for refill today:   Yes  Requested medication (s) are on the active medication list:   Yes  Future visit scheduled:   Yes 9/17 with Dr. Laural Benes   Last ordered: 04/11/2022 15 ml, 3 refills  Returned because order is for 10 units to be given but pt is only taking 7 units.  Provider to review.   Requested Prescriptions  Pending Prescriptions Disp Refills   insulin glargine (LANTUS) 100 UNIT/ML Solostar Pen 15 mL 3    Sig: Inject 10 Units into the skin at bedtime.     Endocrinology:  Diabetes - Insulins Passed - 03/08/2023 12:24 PM      Passed - HBA1C is between 0 and 7.9 and within 180 days    HbA1c, POC (controlled diabetic range)  Date Value Ref Range Status  11/07/2022 6.4 0.0 - 7.0 % Final         Passed - Valid encounter within last 6 months    Recent Outpatient Visits           1 week ago Encounter for medication review   [facility] [place], [place] L, RPH-CPP   3 weeks ago Acute pain of right knee   [facility] [place], [place] W, NP   3 months ago Hypertension associated with diabetes [facility])   [address] [facility] [place], [place] L, RPH-CPP   4 months ago Type 2 diabetes mellitus with morbid obesity [facility])   [facility] Jonah Blue B, MD   7 months ago Type 2 diabetes mellitus with morbid obesity [facility])   [place] [facility] Marcine Matar, MD       Future Appointments             In 5 days Marcine Matar, MD [facility]

## 2023-03-14 ENCOUNTER — Ambulatory Visit: Payer: Medicare PPO | Attending: Internal Medicine | Admitting: Internal Medicine

## 2023-03-14 ENCOUNTER — Encounter: Payer: Self-pay | Admitting: Internal Medicine

## 2023-03-14 VITALS — BP 132/68 | HR 66 | Temp 97.9°F | Ht 68.0 in | Wt 256.0 lb

## 2023-03-14 DIAGNOSIS — Z7984 Long term (current) use of oral hypoglycemic drugs: Secondary | ICD-10-CM

## 2023-03-14 DIAGNOSIS — Z23 Encounter for immunization: Secondary | ICD-10-CM | POA: Diagnosis not present

## 2023-03-14 DIAGNOSIS — E1169 Type 2 diabetes mellitus with other specified complication: Secondary | ICD-10-CM | POA: Diagnosis not present

## 2023-03-14 DIAGNOSIS — E669 Obesity, unspecified: Secondary | ICD-10-CM | POA: Diagnosis not present

## 2023-03-14 DIAGNOSIS — Z794 Long term (current) use of insulin: Secondary | ICD-10-CM | POA: Diagnosis not present

## 2023-03-14 DIAGNOSIS — I152 Hypertension secondary to endocrine disorders: Secondary | ICD-10-CM

## 2023-03-14 DIAGNOSIS — E1159 Type 2 diabetes mellitus with other circulatory complications: Secondary | ICD-10-CM

## 2023-03-14 DIAGNOSIS — M25561 Pain in right knee: Secondary | ICD-10-CM

## 2023-03-14 DIAGNOSIS — M19011 Primary osteoarthritis, right shoulder: Secondary | ICD-10-CM | POA: Diagnosis not present

## 2023-03-14 DIAGNOSIS — E119 Type 2 diabetes mellitus without complications: Secondary | ICD-10-CM

## 2023-03-14 DIAGNOSIS — M25562 Pain in left knee: Secondary | ICD-10-CM

## 2023-03-14 DIAGNOSIS — G8929 Other chronic pain: Secondary | ICD-10-CM

## 2023-03-14 DIAGNOSIS — M65322 Trigger finger, left index finger: Secondary | ICD-10-CM | POA: Diagnosis not present

## 2023-03-14 LAB — POCT GLYCOSYLATED HEMOGLOBIN (HGB A1C): HbA1c, POC (controlled diabetic range): 7.3 % — AB (ref 0.0–7.0)

## 2023-03-14 MED ORDER — TRAMADOL HCL 50 MG PO TABS: 50.0000 mg | ORAL_TABLET | Freq: Two times a day (BID) | ORAL | 0 refills | Status: DC | PRN

## 2023-03-14 NOTE — Progress Notes (Signed)
Patient ID: [name], female    DOB: Mar 24, 1951  MRN: [id number]  CC: Diabetes (DM f/u. Med refills. /Swelling of R & L knee, pain/Pointer finger is "locking"up and causing soreness of L hand/Flu vax administered - C.A)   Subjective: [name] is a 72 y.o. female who presents for chronic ds management. Her concerns today include:  Pt with hx of HTN, DM, HL, depression, chronic back pain, RT breast CA (intraductal CA, ER/PR+, lumpectomy and XRT - completed 08/2017), CKD stage 3, CVA 2011 (residual weakness on LT side), CAD s/p CABG and MVR/resection of fibroelastoma, IDA.    Does not have meds with her.   DM/Obesity: A1C 7.3 A1c up from 6.4 on last visit Currently on Lantus insulin 7 units and Farxiga 10 mg.  No longer on Ozempic 0.5 mg because she can not get it at a reduce price again until the beginning of next yr. Insurance did not approve for CGM Checks BS every morning; range 101-132 Doing better with eating habits.  Has cut back on starches -needs new referral for eye exam, Dr. Nolon Nations retired . HTN:  Reports compliance with meds that includes carvedilol 25 mg twice a day, Norvasc 10 mg daily, hydralazine 100 mg/50mg /100mg  twice a day, valsartan/HCTZ 160/25 mg daily.  Only took Coreg and morning Hydralazine this a.m.  Meds make her a little drowsy so did not take all this a.m given that she had to drive to this appt No device to check BP.   HL: Reports compliance with atorvastatin 80 mg.  Last LDL was 87 in May of this year.  Complaining of swelling in the right and left knees when she is on feet all day. Some popping in RT knee when she walks.  Having to use her cane more.  No falls.  Saw NP for RT knee last mth, x-rays ordered but she has not gone to get it done as yet. Right shoulder still bothersome to her.  Aches especially at nights.  Request refill on tramadol. Some spasms in LT index finger.  Some swelling in the jt of this finger as well.  Hurts to open  stuff.  Had surgeries on both hands in past for trigger finger Patient Active Problem List   Diagnosis Date Noted   Primary osteoarthritis of right shoulder 08/10/2022   CAP (community acquired pneumonia) 06/22/2022   Stage 3b chronic kidney disease (CKD) (HCC) 06/22/2022   Trigger finger, right ring finger 12/02/2021   DM type 2 with diabetic peripheral neuropathy (HCC) 07/26/2021   Bilateral low back pain with bilateral sciatica 07/26/2021   Gait abnormality 07/26/2021   Controlled substance agreement signed 12/31/2020   Shoulder stiffness, left 07/07/2020   Status post lumbar spine surgery for decompression of spinal cord 06/09/2020   Lumbar stenosis 05/27/2020   Spinal stenosis of lumbar region 05/21/2019   Complete tear of left rotator cuff 05/16/2019   Uncontrolled type 2 diabetes mellitus with hyperglycemia (HCC) 12/19/2018   Microalbuminuria 04/08/2018   Genetic testing 09/25/2017   Family history of breast cancer    Family history of prostate cancer    Malignant neoplasm of upper-outer quadrant of right breast in female, estrogen receptor positive (HCC) 06/26/2017   Lumbar herniated disc 04/15/2015   Abnormal nuclear stress test 04/03/2015   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   CAD (coronary artery disease), native coronary artery 03/30/2015   S/P mitral valve repair 11/16/2010   Dyslipidemia 06/29/2010   Diabetes (HCC) 09/29/2006  ANEMIA-IRON DEFICIENCY 09/29/2006   Essential hypertension 09/29/2006     Current Outpatient Medications on File Prior to Visit  Medication Sig Dispense Refill   Accu-Chek Softclix Lancets lancets Use to check blood sugar 3 times daily. 100 each 6   albuterol (VENTOLIN HFA) 108 (90 Base) MCG/ACT inhaler Inhale 2 puffs into the lungs every 6 (six) hours as needed for wheezing or shortness of breath. 1 each 0   amLODipine (NORVASC) 10 MG tablet TAKE 1 TABLET EVERY DAY 90 tablet 2   aspirin EC 81 MG tablet Take 1 tablet (81 mg total)  by mouth daily. 30 tablet 5   atorvastatin (LIPITOR) 80 MG tablet TAKE 1 TABLET EVERY DAY (DOSE INCREASE) (Patient taking differently: Take 80 mg by mouth daily.) 90 tablet 2   Blood Glucose Calibration (TRUE METRIX LEVEL 1) Low SOLN UAD 1 each 0   Blood Glucose Monitoring Suppl (ACCU-CHEK GUIDE) w/Device KIT Use to check blood sugar 3 times daily. E11.42 1 kit 0   buPROPion (WELLBUTRIN XL) 150 MG 24 hr tablet TAKE 1 TABLET TWICE DAILY 180 tablet 1   carvedilol (COREG) 25 MG tablet TAKE 1 TABLET TWICE DAILY 180 tablet 3   cholecalciferol (VITAMIN D3) 25 MCG (1000 UNIT) tablet Take 1,000 Units by mouth daily.     diclofenac Sodium (VOLTAREN) 1 % GEL APPLY 2 GRAMS TOPICALLY 4 (FOUR) TIMES DAILY AS NEEDED. 100 g 0   DULoxetine (CYMBALTA) 30 MG capsule Take 1 capsule (30 mg total) by mouth daily. 180 capsule 1   FARXIGA 10 MG TABS tablet Take 10 mg by mouth daily.     ferrous sulfate (FEROSUL) 325 (65 FE) MG tablet TAKE 1 TABLET (325 MG TOTAL) BY MOUTH DAILY WITH BREAKFAST. (Patient taking differently: Take 325 mg by mouth daily with breakfast.) 90 tablet 1   gabapentin (NEURONTIN) 100 MG capsule Take 1 capsule (100 mg total) by mouth 3 (three) times daily. 90 capsule 5   glucose blood (ACCU-CHEK GUIDE) test strip Use to check blood sugar 3 times daily. 100 each 6   hydrALAZINE (APRESOLINE) 100 MG tablet 1 tab PO Q a.m, 1/2 tab Po Q noon and 1 tab PO Q p.m 225 tablet 1   insulin glargine (LANTUS) 100 UNIT/ML Solostar Pen Inject 10 Units into the skin at bedtime. 15 mL 3   Insulin Pen Needle (PEN NEEDLES) 31G X 8 MM MISC UAD 100 each 6   letrozole (FEMARA) 2.5 MG tablet TAKE 1 TABLET EVERY DAY 90 tablet 3   methocarbamol (ROBAXIN) 500 MG tablet TAKE 1 TABLET EVERY 8 HOURS AS NEEDED FOR MUSCLE SPASMS. 270 tablet 0   pantoprazole (PROTONIX) 40 MG tablet TAKE 1 TABLET EVERY DAY 90 tablet 2   traMADol (ULTRAM) 50 MG tablet TAKE 1 TABLET EVERY 12 HOURS AS NEEDED . PRESCRIPTION TO LAST 1 MONTH. 40 tablet 0    traZODone (DESYREL) 50 MG tablet Take 1 tablet (50 mg total) by mouth at bedtime as needed. for sleep 90 tablet 1   valsartan-hydrochlorothiazide (DIOVAN-HCT) 160-25 MG tablet Take 1 tablet by mouth daily. 90 tablet 2   acetaminophen (TYLENOL) 325 MG tablet Take 1 tablet (325 mg total) by mouth every 6 (six) hours as needed. (Patient not taking: Reported on 03/14/2023) 30 tablet 2   Ascorbic Acid (VITAMIN C PO) Take 500 mg by mouth daily. (Patient not taking: Reported on 03/14/2023)     Continuous Glucose Receiver (FREESTYLE LIBRE 2 READER) DEVI Use to check blood sugar three times daily.  E11.69 (Patient not taking: Reported on 03/14/2023) 1 each 0   Continuous Glucose Sensor (FREESTYLE LIBRE 2 SENSOR) MISC Use to check blood sugar three times daily. Change sensors once every 14 days. E11.69 (Patient not taking: Reported on 03/14/2023) 2 each 6   polyethylene glycol powder (GLYCOLAX/MIRALAX) 17 GM/SCOOP powder Take 17 g by mouth daily as needed for mild constipation. (Patient not taking: Reported on 02/01/2023) 3350 g 1   Semaglutide,0.25 or 0.5MG /DOS, (OZEMPIC, 0.25 OR 0.5 MG/DOSE,) 2 MG/3ML SOPN Inject 0.5 mg into the skin once a week. (Patient not taking: Reported on 03/14/2023) 3 mL 2   No current facility-administered medications on file prior to visit.    No Known Allergies  Social History   Socioeconomic History   Marital status: Married    Spouse name: Not on file   Number of children: 3   Years of education: some college   Highest education level: Not on file  Occupational History    Employer: UNEMPLOYED  Tobacco Use   Smoking status: Former    Current packs/day: 0.00    Types: Cigarettes    Quit date: 10/14/2010    Years since quitting: 12.4   Smokeless tobacco: Never  Vaping Use   Vaping status: Never Used  Substance and Sexual Activity   Alcohol use: Yes    Comment: social   Drug use: No   Sexual activity: Not Currently  Other Topics Concern   Not on file  Social  History Narrative   Lives with husband, stay at home, uses cane occasionally, still active/ambulatory.    Left-handed.   No daily caffeine use.   Social Determinants of Health   Financial Resource Strain: Low Risk  (01/24/2023)   Overall Financial Resource Strain (CARDIA)    Difficulty of Paying Living Expenses: Not hard at all  Food Insecurity: No Food Insecurity (01/24/2023)   Hunger Vital Sign    Worried About Running Out of Food in the Last Year: Never true    Ran Out of Food in the Last Year: Never true  Transportation Needs: No Transportation Needs (01/24/2023)   PRAPARE - Administrator, Civil Service (Medical): No    Lack of Transportation (Non-Medical): No  Physical Activity: Insufficiently Active (01/24/2023)   Exercise Vital Sign    Days of Exercise per Week: 3 days    Minutes of Exercise per Session: 30 min  Stress: No Stress Concern Present (01/24/2023)   [employer] of Occupational Health - Occupational Stress Questionnaire    Feeling of Stress : Not at all  Social Connections: Moderately Integrated (01/24/2023)   Social Connection and Isolation Panel [NHANES]    Frequency of Communication with Friends and Family: More than three times a week    Frequency of Social Gatherings with Friends and Family: Three times a week    Attends Religious Services: More than 4 times per year    Active Member of Clubs or Organizations: No    Attends Banker Meetings: Never    Marital Status: Married  Catering manager Violence: Not At Risk (01/24/2023)   Humiliation, Afraid, Rape, and Kick questionnaire    Fear of Current or Ex-Partner: No    Emotionally Abused: No    Physically Abused: No    Sexually Abused: No    Family History  Problem Relation Age of Onset   Colon cancer Mother        dx 17s; deceased 35   Irritable bowel syndrome Mother  Prostate cancer Father        deceased 62s   Cirrhosis Sister        died of GI bleed associated with  cirrhosis of the liver   Stroke Brother    Prostate cancer Brother 34       deceased 41   Diabetes Brother        x3   Kidney disease Brother    Cancer Brother 28       unk. type   Breast cancer Cousin        several maternal 1st and 2nd cousins with breast cancer   Rectal cancer Neg Hx    Stomach cancer Neg Hx    Esophageal cancer Neg Hx     Past Surgical History:  Procedure Laterality Date   BREAST BIOPSY Right 08/26/2019   benign   BREAST LUMPECTOMY Right    BREAST LUMPECTOMY WITH RADIOACTIVE SEED AND SENTINEL LYMPH NODE BIOPSY Right 07/20/2017   Procedure: RIGHT BREAST LUMPECTOMY WITH RADIOACTIVE SEED AND SENTINEL LYMPH NODE BIOPSY;  Surgeon: Ovidio Kin, MD;  Location: [facility] OR;  Service: General;  Laterality: Right;   CARDIAC CATHETERIZATION  04/03/2015   Procedure: Left Heart Cath and Cors/Grafts Angiography;  Surgeon: Peter M Swaziland, MD;  Location: [facility]VAS[facility] LAB;  Service: Cardiovascular;;   CORONARY ARTERY BYPASS GRAFT  1/12   history of ankle fractures requiring surgery     LUMBAR LAMINECTOMY/DECOMPRESSION MICRODISCECTOMY Left 04/15/2015   Procedure: LUMBAR LAMINECTOMY/DECOMPRESSION MICRODISCECTOMY 1 LEVEL;  Surgeon: Tressie Stalker, MD;  Location: [facility]O [facility]S;  Service: Neurosurgery;  Laterality: Left;  Left L23 microdiskectomy   LUMBAR LAMINECTOMY/DECOMPRESSION MICRODISCECTOMY N/A 05/27/2020   Procedure: L2-3, L3-4  decompression;  Surgeon: Eldred Manges, MD;  Location: [facility] OR;  Service: Orthopedics;  Laterality: N/A;   MV repair and resection of mass  1/12   RESECTION DISTAL CLAVICAL Left 05/16/2019   Procedure: RESECTION DISTAL CLAVICAL;  Surgeon: Kathryne Hitch, MD;  Location: [facility];  Service: Orthopedics;  Laterality: Left;   SHOULDER ARTHROSCOPY WITH ROTATOR CUFF REPAIR AND SUBACROMIAL DECOMPRESSION Left 05/16/2019   Procedure: SHOULDER ARTHROSCOPY WITH ROTATOR CUFF REPAIR AND SUBACROMIAL DECOMPRESSION;  Surgeon: Kathryne Hitch, MD;  Location: [facility];  Service: Orthopedics;  Laterality: Left;   TONSILLECTOMY  1971   TOTAL ABDOMINAL HYSTERECTOMY  1978   TRIGGER FINGER RELEASE Right 12/02/2021   Procedure: RIGHT RING FINGER A-1 PULLEY RELEASE;  Surgeon: Kathryne Hitch, MD;  Location: [facility];  Service: Orthopedics;  Laterality: Right;   VESICOVAGINAL FISTULA CLOSURE W/ TAH      ROS: Review of Systems Negative except as stated above  PHYSICAL EXAM: BP 132/68 (BP Location: Left Arm, Patient Position: Sitting, Cuff Size: Large)   Pulse 66   Temp 97.9 F (36.6 C) (Oral)   Ht 5\' 8"  (1.727 m)   Wt 256 lb (116.1 kg)   SpO2 99%   BMI 38.92 kg/m   Wt Readings from Last 3 Encounters:  03/14/23 256 lb (116.1 kg)  02/21/23 247 lb (112 kg)  02/15/23 254 lb 3.2 oz (115.3 kg)  BP 148/65  Physical Exam  General appearance - alert, well appearing, and in no distress Mental status - normal mood, behavior, speech, dress, motor activity, and thought processes Chest - clear to auscultation, no wheezes, rales or rhonchi, symmetric air entry Heart - normal rate, regular rhythm, normal S1, S2, no murmurs, rubs, clicks or gallops Musculoskeletal -knees: Large body habitus.  No point tenderness.  Good passive range of motion. Left hand: Mild soft tissue swelling of the left index finger. Extremities -no lower extremity edema. Diabetic Foot Exam - Simple   Simple Foot Form Diabetic Foot exam was performed with the following findings: Yes 03/14/2023  2:45 PM  Visual Inspection No deformities, no ulcerations, no other skin breakdown bilaterally: Yes Sensation Testing Intact to touch and monofilament testing bilaterally: Yes Pulse Check Posterior Tibialis and Dorsalis pulse intact bilaterally: Yes Comments         Latest Ref Rng & Units 02/23/2023    4:27 PM 11/07/2022    4:06 PM 06/24/2022    3:56 AM  CMP  Glucose 70 - 99 mg/dL   161   BUN 6 - 23 mg/dL 25   37    Creatinine 0.96 - 1.20 mg/dL 0.45   4.09   Sodium 811 - 145 mmol/L   137   Potassium 3.5 - 5.1 mmol/L   5.1   Chloride 98 - 111 mmol/L   112   CO2 22 - 32 mmol/L   21   Calcium 8.9 - 10.3 mg/dL   8.2   Total Protein 6.0 - 8.5 g/dL  6.4    Total Bilirubin 0.0 - 1.2 mg/dL  0.2    Alkaline Phos 44 - 121 IU/L  58    AST 0 - 40 IU/L  24    ALT 0 - 32 IU/L  21     Lipid Panel     Component Value Date/Time   CHOL 204 (H) 11/07/2022 1606   TRIG 114 11/07/2022 1606   HDL 98 11/07/2022 1606   CHOLHDL 2.1 11/07/2022 1606   CHOLHDL 2.8 04/28/2016 1028   VLDL 19 04/28/2016 1028   LDLCALC 87 11/07/2022 1606   LDLDIRECT 101.7 07/18/2011 0901    CBC    Component Value Date/Time   WBC 6.1 11/07/2022 1606   WBC 12.8 (H) 06/24/2022 0356   RBC 4.03 11/07/2022 1606   RBC 3.54 (L) 06/24/2022 0356   HGB 10.0 (L) 11/07/2022 1606   HCT 32.2 (L) 11/07/2022 1606   PLT 259 11/07/2022 1606   MCV 80 11/07/2022 1606   MCH 24.8 (L) 11/07/2022 1606   MCH 24.3 (L) 06/24/2022 0356   MCHC 31.1 (L) 11/07/2022 1606   MCHC 30.0 06/24/2022 0356   RDW 15.3 11/07/2022 1606   LYMPHSABS 2.3 06/22/2022 1512   MONOABS 0.7 06/22/2022 1512   EOSABS 0.3 06/22/2022 1512   BASOSABS 0.1 06/22/2022 1512    ASSESSMENT AND PLAN: 1. Type 2 diabetes mellitus with obesity (HCC) A1c slightly above goal.  She did well on Ozempic but currently cannot afford her co-pay.  Fasting morning blood sugar readings are good so we will keep her on current dose of Lantus 7 units daily.  Continue Farxiga 10 mg daily.  We discussed adding another oral medication but decided to hold off.  She will work on improving eating habits - POCT glycosylated hemoglobin (Hb A1C) - Ambulatory referral to Ophthalmology  2. Insulin long-term use (HCC) See #1 above  3. Diabetes mellitus treated with oral medication (HCC) See #1 above  4. Hypertension associated with diabetes (HCC) Close to goal. Continue carvedilol 25 mg twice a day, Norvasc  10 mg daily, hydralazine 100 mg/50mg /100mg  twice a day, valsartan/HCTZ 160/25 mg daily.  5. Hyperlipidemia associated with type 2 diabetes mellitus (HCC) Continue atorvastatin 80 mg daily.  6. Primary osteoarthritis of right shoulder [place] controlled substance reporting system  reviewed and is appropriate.  She reports relief with tramadol.  Reports no major side effects. - traMADol (ULTRAM) 50 MG tablet; Take 1 tablet (50 mg total) by mouth every 12 (twelve) hours as needed.  Dispense: 40 tablet; Refill: 0  7. Chronic pain of both knees Weight loss encouraged.  Encouraged her to get the x-rays done of the right knee. - AMB referral to orthopedics  8. Trigger index finger of left hand - AMB referral to orthopedics  9. Encounter for immunization - Flu Vaccine Trivalent High Dose (Fluad)      Patient was given the opportunity to ask questions.  Patient verbalized understanding of the plan and was able to repeat key elements of the plan.   This documentation was completed using Paediatric nurse.  Any transcriptional errors are unintentional.  Orders Placed This Encounter  Procedures   Flu Vaccine Trivalent High Dose (Fluad)   POCT glycosylated hemoglobin (Hb A1C)     Requested Prescriptions   Pending Prescriptions Disp Refills   traMADol (ULTRAM) 50 MG tablet 40 tablet 0    No follow-ups on file.  Jonah Blue, MD, FACP

## 2023-03-20 ENCOUNTER — Telehealth: Payer: Self-pay | Admitting: Internal Medicine

## 2023-03-20 ENCOUNTER — Other Ambulatory Visit: Payer: Self-pay | Admitting: Internal Medicine

## 2023-03-20 DIAGNOSIS — K6389 Other specified diseases of intestine: Secondary | ICD-10-CM

## 2023-03-20 NOTE — Telephone Encounter (Signed)
-----   Message from Carie Caddy Pyrtle sent at 03/20/2023  8:16 AM EDT ----- I spoke to the patient about CT results It does show the ascending colon large polyp/mass but no evidence of metastatic disease She needs to see [facility] ASAP to discuss surgical resection, right hemicolectomy for endoscopically unresectable colonic polyp 2.1 cm benign right ovarian cyst; no follow-up is recommended but I will defer this to Dr. Laural Benes her primary care provider  Time provided for questions and answers and she thanked me for the call I told her to let me know if she does not have at least her appointment in place with [facility] by later this week

## 2023-03-20 NOTE — Telephone Encounter (Signed)
Referral submitted to general surgery for ascending colonic mass.

## 2023-03-28 ENCOUNTER — Ambulatory Visit: Payer: Medicare PPO | Admitting: Neurology

## 2023-03-28 ENCOUNTER — Encounter: Payer: Self-pay | Admitting: Neurology

## 2023-03-28 VITALS — BP 166/74 | HR 80 | Ht 68.0 in | Wt 256.0 lb

## 2023-03-28 DIAGNOSIS — M25511 Pain in right shoulder: Secondary | ICD-10-CM | POA: Diagnosis not present

## 2023-03-28 DIAGNOSIS — G8929 Other chronic pain: Secondary | ICD-10-CM | POA: Diagnosis not present

## 2023-03-28 DIAGNOSIS — R269 Unspecified abnormalities of gait and mobility: Secondary | ICD-10-CM | POA: Diagnosis not present

## 2023-03-28 DIAGNOSIS — M542 Cervicalgia: Secondary | ICD-10-CM | POA: Diagnosis not present

## 2023-03-28 MED ORDER — GABAPENTIN 300 MG PO CAPS: 300.0000 mg | ORAL_CAPSULE | Freq: Three times a day (TID) | ORAL | 5 refills | Status: DC

## 2023-03-28 MED ORDER — CELECOXIB 50 MG PO CAPS: 50.0000 mg | ORAL_CAPSULE | Freq: Two times a day (BID) | ORAL | 3 refills | Status: DC | PRN

## 2023-03-28 NOTE — Progress Notes (Signed)
Chief Complaint  Patient presents with   Follow-up    Rm14, sister present, DM type 2 with diabetic peripheral neuropathy, Bilateral low back pain with bilateral sciatica, unspecified chronicity,  Gait abnormality:pt stated that she is not doing well at all with right foot and hand pains neuropathy brning and tingly. Daily back pain, difficulty walking.       ASSESSMENT AND PLAN  [name] is a 72 y.o. female  Worsening right neck and shoulder pain, Diabetic peripheral neuropathy  Length-dependent sensory changes, gait abnormality  Known history of significant cervical and lumbar degenerative disease,  X-ray of right shoulder to rule out right shoulder pathology  EMG nerve conduction study  Higher dose of gabapentin 300 mg 3 times a day, Celebrex 50 mg twice a day as needed  DIAGNOSTIC DATA (LABS, IMAGING, TESTING) - I reviewed patient records, labs, notes, testing and imaging myself where available.   MEDICAL HISTORY:  [name], is a 72 year old female seen in request by her primary care doctor Jonah Blue B for evaluation of low back pain, lower extremity paresthesia,  I reviewed and summarized the referring note. PMHX. HLD HTN DM since 1999 Lumbar decompression surgery, chronic low back pain,  Right breast cancer, s/p lobectomy, s/p radiation therapy CAD s/p CABG MV repair surgery Stroke Depression   She had long history of diabetes, since 2020, she began to notice sole of bilateral feet sensitive, getting worse with weightbearing, could not tolerate walking on the floor bare feet, in addition, she has chronic low back pain, lumbar decompression surgery in the past, bilateral knee pain, bilateral calf pain with prolonged standing, she could barely stand to finish washing dishes or cooking  She has intermittent bilateral finger paresthesia, taking gabapentin 100 mg 3 times a day with some help, has urinary urgency  She has intermittent headache,  oftentimes radiating to her left shoulder, about twice a week, sharp transient pain last for minutes or longer   UPDATE Mar 28 2023: I started her on Cymbalta following last visit for diabetic neuropathic pain, somehow she is no longer taking it, gabapentin on a low-dose 100 mg 3 times daily no significant side effect, no significant help,  Today her main concern is 2 months history of worsening right neck pain, right shoulder pain, limited range of motion, worsening pain when she adductor right shoulder, she has a tendency to sleep on her left side, oftentimes wake up in the middle of the night with such deep achy pain brought her her into tears,  She was seen by orthopedic surgeon Dr. Magnus Ivan, had shots to her right shoulder without helping her symptoms, she had arthroscopic surgery for her left rotator cuff in the past,   MRI of cervical spine February 2023 showed multilevel degenerative changes, most noticeable C5-6 C6-7, with evidence of mild canal stenosis severe bilateral foraminal narrowing  PHYSICAL EXAM:   Vitals:   03/28/23 1247  BP: (!) 166/74  Pulse: 80  Weight: 256 lb (116.1 kg)  Height: 5\' 8"  (1.727 m)     Body mass index is 38.92 kg/m.  PHYSICAL EXAMNIATION:  Gen: NAD, conversant, well nourised, well groomed                     Cardiovascular: Regular rate rhythm, no peripheral edema, warm, nontender. Eyes: Conjunctivae clear without exudates or hemorrhage Neck: Supple, no carotid bruits. Pulmonary: Clear to auscultation bilaterally   NEUROLOGICAL EXAM:  MENTAL STATUS: Depressed looking elderly female, obesity  Speech/cognition: Awake, alert, oriented to history taking and care conversation   CRANIAL NERVES: CN II: Visual fields are full to confrontation. Pupils are round equal and briskly reactive to light. CN III, IV, VI: extraocular movement are normal. No ptosis. CN V: Facial sensation is intact to light touch CN VII: Face is symmetric with normal eye  closure  CN VIII: Hearing is normal to causal conversation. CN IX, X: Phonation is normal. CN XI: Head turning and shoulder shrug are intact  MOTOR: There was no significant bilateral upper and lower extremity proximal and distal muscle weakness, limited range of motion of bilateral shoulder, right worse than left, significant right shoulder pain on deep palpitation  REFLEXES: Reflexes are 1 and symmetric at the biceps, triceps, 1/1 knees, and absent at ankles. Plantar responses are extensor bilaterally  SENSORY: Length dependent decreased light touch, pinprick, vibratory sensation to distal shin level,  COORDINATION: There is no trunk or limb dysmetria noted.  GAIT/STANCE: She needs push-up to get up from seated position, antalgic, limited by her big body habitus, unsteady  REVIEW OF SYSTEMS:  Full 14 system review of systems performed and notable only for as above All other review of systems were negative.   ALLERGIES: No Known Allergies  HOME MEDICATIONS: Current Outpatient Medications  Medication Sig Dispense Refill   albuterol (VENTOLIN HFA) 108 (90 Base) MCG/ACT inhaler Inhale 2 puffs into the lungs every 6 (six) hours as needed for wheezing or shortness of breath. 1 each 0   amLODipine (NORVASC) 10 MG tablet TAKE 1 TABLET EVERY DAY 90 tablet 2   aspirin EC 81 MG tablet Take 1 tablet (81 mg total) by mouth daily. 30 tablet 5   atorvastatin (LIPITOR) 80 MG tablet TAKE 1 TABLET EVERY DAY (DOSE INCREASE) (Patient taking differently: Take 80 mg by mouth daily.) 90 tablet 2   buPROPion (WELLBUTRIN XL) 150 MG 24 hr tablet TAKE 1 TABLET TWICE DAILY 180 tablet 1   carvedilol (COREG) 25 MG tablet TAKE 1 TABLET TWICE DAILY 180 tablet 3   diclofenac Sodium (VOLTAREN) 1 % GEL APPLY 2 GRAMS TOPICALLY 4 (FOUR) TIMES DAILY AS NEEDED. 100 g 0   DULoxetine (CYMBALTA) 30 MG capsule Take 1 capsule (30 mg total) by mouth daily. 180 capsule 1   FARXIGA 10 MG TABS tablet Take 10 mg by mouth  daily.     gabapentin (NEURONTIN) 100 MG capsule Take 1 capsule (100 mg total) by mouth 3 (three) times daily. 90 capsule 5   hydrALAZINE (APRESOLINE) 100 MG tablet 1 tab PO Q a.m, 1/2 tab Po Q noon and 1 tab PO Q p.m 225 tablet 1   insulin glargine (LANTUS) 100 UNIT/ML Solostar Pen Inject 10 Units into the skin at bedtime. 15 mL 3   Insulin Pen Needle (PEN NEEDLES) 31G X 8 MM MISC UAD 100 each 6   letrozole (FEMARA) 2.5 MG tablet TAKE 1 TABLET EVERY DAY 90 tablet 3   methocarbamol (ROBAXIN) 500 MG tablet TAKE 1 TABLET EVERY 8 HOURS AS NEEDED FOR MUSCLE SPASMS. 270 tablet 0   pantoprazole (PROTONIX) 40 MG tablet TAKE 1 TABLET EVERY DAY 90 tablet 2   traMADol (ULTRAM) 50 MG tablet Take 1 tablet (50 mg total) by mouth every 12 (twelve) hours as needed. 40 tablet 0   traZODone (DESYREL) 50 MG tablet Take 1 tablet (50 mg total) by mouth at bedtime as needed. for sleep 90 tablet 1   valsartan-hydrochlorothiazide (DIOVAN-HCT) 160-25 MG tablet Take 1 tablet by mouth  daily. 90 tablet 2   No current facility-administered medications for this visit.    PAST MEDICAL HISTORY: Past Medical History:  Diagnosis Date   Abscess in epidural space of L2-L5 lumbar spine 03/2006   Anemia, iron deficiency    Arthritis    Breast cancer (HCC) 06/12/2017   right breast   CAD (coronary artery disease), native coronary artery 03/30/2015   Cataract    Colon polyp    a. Multiple colonic polyps status post colonoscopy in October 2007, consistent with tubular adenoma, tubulovillous adenoma with no high-grade dysplasia or malignancy identified.    Constipation    Coronary artery disease    a. s/p CABG 06/2010: S-D2; S-PDA (at time of MV surgery).   Depression    Diabetes mellitus    type II   Family history of breast cancer    Family history of prostate cancer    Genetic testing 09/25/2017   Multi-Cancer panel (83 genes) @ [facility] - No pathogenic mutations detected   GERD (gastroesophageal reflux disease)    Hx  of transient ischemic attack (TIA)    a. See stroke section.   Hyperlipidemia    Hypertension    Hypoxia    a. Has history of acute hypoxic respiratory failure in the setting of bronchitis/PNA or prior admissions.   MRSA infection    a. History of recurrent skin infection and soft tissue abscesses, with MRSA positive in the past.   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 mitral valve - s/p resection and MV repair 06/2010 Dr. Cornelius Moras.   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 Distal R MCA.   Stroke [facility])    a. 11/2009: mitral mass diagnosed at this time, also has distal R MCA stenosis, tx with coumadin. b. Readmitted 05/2010 with TIA symptoms - had not been taking Coumadin. s/p MV surgery 06/2010. Coumadin stopped 2013 after review of chart by Dr. Jens Som since mass was removed (stroke felt possibly related to this).     PAST SURGICAL HISTORY: Past Surgical History:  Procedure Laterality Date   BREAST BIOPSY Right 08/26/2019   benign   BREAST LUMPECTOMY Right    BREAST LUMPECTOMY WITH RADIOACTIVE SEED AND SENTINEL LYMPH NODE BIOPSY Right 07/20/2017   Procedure: RIGHT BREAST LUMPECTOMY WITH RADIOACTIVE SEED AND SENTINEL LYMPH NODE BIOPSY;  Surgeon: Ovidio Kin, MD;  [facility] OR;  Service: General;  Laterality: Right;   CARDIAC CATHETERIZATION  04/03/2015   Procedure: Left Heart Cath and Cors/Grafts Angiography;  Surgeon: Peter M Swaziland, MD;  Location: [facility] INVASIVE CV LAB;  Service: Cardiovascular;;   CORONARY ARTERY BYPASS GRAFT  1/12   history of ankle fractures requiring surgery     LUMBAR LAMINECTOMY/DECOMPRESSION MICRODISCECTOMY Left 04/15/2015   Procedure: LUMBAR LAMINECTOMY/DECOMPRESSION MICRODISCECTOMY 1 LEVEL;  Surgeon: Tressie Stalker, MD;  Location: [facility];  Service: Neurosurgery;  Laterality: Left;  Left L23 microdiskectomy   LUMBAR  LAMINECTOMY/DECOMPRESSION MICRODISCECTOMY N/A 05/27/2020   Procedure: L2-3, L3-4  decompression;  Surgeon: Eldred Manges, MD;  Location: [facility] OR;  Service: Orthopedics;  Laterality: N/A;   MV repair and resection of mass  1/12   RESECTION DISTAL CLAVICAL Left 05/16/2019   Procedure: RESECTION DISTAL CLAVICAL;  Surgeon: Kathryne Hitch, MD;  Location: [facility];  Service: Orthopedics;  Laterality: Left;   SHOULDER ARTHROSCOPY WITH  ROTATOR CUFF REPAIR AND SUBACROMIAL DECOMPRESSION Left 05/16/2019   Procedure: SHOULDER ARTHROSCOPY WITH ROTATOR CUFF REPAIR AND SUBACROMIAL DECOMPRESSION;  Surgeon: Kathryne Hitch, MD;  Location: [facility];  Service: Orthopedics;  Laterality: Left;   TONSILLECTOMY  1971   TOTAL ABDOMINAL HYSTERECTOMY  1978   TRIGGER FINGER RELEASE Right 12/02/2021   Procedure: RIGHT RING FINGER A-1 PULLEY RELEASE;  Surgeon: Kathryne Hitch, MD;  Location: [facility];  Service: Orthopedics;  Laterality: Right;   VESICOVAGINAL FISTULA CLOSURE W/ TAH      FAMILY HISTORY: Family History  Problem Relation Age of Onset   Colon cancer Mother        dx 43s; deceased 20   Irritable bowel syndrome Mother    Prostate cancer Father        deceased 43s   Cirrhosis Sister        died of GI bleed associated with cirrhosis of the liver   Stroke Brother    Prostate cancer Brother 68       deceased 70   Diabetes Brother        x3   Kidney disease Brother    Cancer Brother 93       unk. type   Breast cancer Cousin        several maternal 1st and 2nd cousins with breast cancer   Rectal cancer Neg Hx    Stomach cancer Neg Hx    Esophageal cancer Neg Hx     SOCIAL HISTORY: Social History   Socioeconomic History   Marital status: Married    Spouse name: Not on file   Number of children: 3   Years of education: some college   Highest education level: Not on file  Occupational History    Employer: UNEMPLOYED   Tobacco Use   Smoking status: Former    Current packs/day: 0.00    Types: Cigarettes    Quit date: 10/14/2010    Years since quitting: 12.4   Smokeless tobacco: Never  Vaping Use   Vaping status: Never Used  Substance and Sexual Activity   Alcohol use: Yes    Comment: social   Drug use: No   Sexual activity: Not Currently  Other Topics Concern   Not on file  Social History Narrative   Lives with husband, stay at home, uses cane occasionally, still active/ambulatory.    Left-handed.   No daily caffeine use.   Social Determinants of Health   Financial Resource Strain: Low Risk  (01/24/2023)   Overall Financial Resource Strain (CARDIA)    Difficulty of Paying Living Expenses: Not hard at all  Food Insecurity: No Food Insecurity (01/24/2023)   Hunger Vital Sign    Worried About Running Out of Food in the Last Year: Never true    Ran Out of Food in the Last Year: Never true  Transportation Needs: No Transportation Needs (01/24/2023)   PRAPARE - Administrator, Civil Service (Medical): No    Lack of Transportation (Non-Medical): No  Physical Activity: Insufficiently Active (01/24/2023)   Exercise Vital Sign    Days of Exercise per Week: 3 days    Minutes of Exercise per Session: 30 min  Stress: No Stress Concern Present (01/24/2023)   [employer] of Occupational Health - Occupational Stress Questionnaire    Feeling of Stress : Not at all  Social Connections: Moderately Integrated (01/24/2023)   Social Connection and Isolation Panel [NHANES]    Frequency of Communication with  Friends and Family: More than three times a week    Frequency of Social Gatherings with Friends and Family: Three times a week    Attends Religious Services: More than 4 times per year    Active Member of Clubs or Organizations: No    Attends Banker Meetings: Never    Marital Status: Married  Catering manager Violence: Not At Risk (01/24/2023)   Humiliation, Afraid, Rape, and  Kick questionnaire    Fear of Current or Ex-Partner: No    Emotionally Abused: No    Physically Abused: No    Sexually Abused: No      Levert Feinstein, M.D. Ph.D.  [facility] [address], [address] [place], [place] [address] Ph: [phone] Fax: [phone]  CC:  Marcine Matar, MD [address] [place] [place],  [place] [address]  Marcine Matar, MD

## 2023-03-29 ENCOUNTER — Ambulatory Visit: Payer: Medicare PPO | Admitting: Orthopaedic Surgery

## 2023-03-29 ENCOUNTER — Other Ambulatory Visit (INDEPENDENT_AMBULATORY_CARE_PROVIDER_SITE_OTHER): Payer: Self-pay

## 2023-03-29 DIAGNOSIS — G8929 Other chronic pain: Secondary | ICD-10-CM | POA: Diagnosis not present

## 2023-03-29 DIAGNOSIS — M25511 Pain in right shoulder: Secondary | ICD-10-CM | POA: Diagnosis not present

## 2023-03-29 DIAGNOSIS — M65322 Trigger finger, left index finger: Secondary | ICD-10-CM | POA: Diagnosis not present

## 2023-03-29 NOTE — Progress Notes (Signed)
The patient is someone of seen many times in the past.  She is 72 years old.  She has been dealing with chronic right shoulder pain but also triggering of her left index finger.  I have performed A1 pulley releases on fingers on her right and left hand.  She had tried and failed conservative treatment multiple times for the A1 pulley triggering with pain.  She has significant triggering now with pain over the A1 pulley of her left index finger.  She is a diabetic but her last hemoglobin A1c was 6.3.  We did place a steroid injection many months ago in her right shoulder subacromial outlet which she tolerated well and this did help her for a while.  Examination of her right shoulder shows that it moves smoothly and fluidly but no blocks to rotation.  There is not gross weakness in the rotator cuff.  A lot of her pain seems to be at the anterior shoulder around the biceps tendon but also the [facility] joint.  3 views of the right shoulder show no acute findings other than some AC joint arthritic changes.  The glenohumeral joint is in good position and there is only mild decrease in her subacromial outlet.  Examination of her left hand shows active triggering of the index finger with pain at the A1 pulley.  From a hand standpoint, we will set her up for an A1 pulley trigger finger release.  This is given her history of multiple failures of injections of other pulleys that ended up needing A1 pulley releases.  She has done well with these in the past and does understand the risk and benefits of these types of outpatient surgeries.  We will then see her back in 2 weeks postoperative.  From the standpoint of her right shoulder, I would like to send her to Dr. Shon Baton for an ultrasound-guided injection in her anterior shoulder which most likely should be the [facility] joint but I will leave this up to him in terms of the [facility] joint versus a glenohumeral joint based on his clinical exam and findings as well.  We will be in touch  with setting up for surgery in the next time I will see her will be the day of surgery and then 2 weeks postoperative for suture removal.  We can see how her shoulder is doing at that standpoint as well.

## 2023-04-04 ENCOUNTER — Ambulatory Visit: Payer: Medicare PPO | Admitting: Sports Medicine

## 2023-04-04 DIAGNOSIS — I10 Essential (primary) hypertension: Secondary | ICD-10-CM | POA: Diagnosis not present

## 2023-04-04 DIAGNOSIS — S91209A Unspecified open wound of unspecified toe(s) with damage to nail, initial encounter: Secondary | ICD-10-CM | POA: Diagnosis not present

## 2023-04-04 DIAGNOSIS — Z23 Encounter for immunization: Secondary | ICD-10-CM | POA: Diagnosis not present

## 2023-04-04 DIAGNOSIS — B9689 Other specified bacterial agents as the cause of diseases classified elsewhere: Secondary | ICD-10-CM | POA: Diagnosis not present

## 2023-04-04 DIAGNOSIS — L089 Local infection of the skin and subcutaneous tissue, unspecified: Secondary | ICD-10-CM | POA: Diagnosis not present

## 2023-04-06 ENCOUNTER — Encounter: Payer: Self-pay | Admitting: Sports Medicine

## 2023-04-06 ENCOUNTER — Other Ambulatory Visit: Payer: Self-pay

## 2023-04-06 ENCOUNTER — Ambulatory Visit: Payer: Medicare PPO | Admitting: Sports Medicine

## 2023-04-06 DIAGNOSIS — M25511 Pain in right shoulder: Secondary | ICD-10-CM | POA: Diagnosis not present

## 2023-04-06 DIAGNOSIS — M19011 Primary osteoarthritis, right shoulder: Secondary | ICD-10-CM

## 2023-04-06 DIAGNOSIS — G8929 Other chronic pain: Secondary | ICD-10-CM

## 2023-04-06 MED ORDER — LIDOCAINE HCL 1 % IJ SOLN
2.0000 mL | INTRAMUSCULAR | Status: AC | PRN
Start: 2023-04-06 — End: 2023-04-06
  Administered 2023-04-06: 2 mL

## 2023-04-06 MED ORDER — BUPIVACAINE HCL 0.25 % IJ SOLN
2.0000 mL | INTRAMUSCULAR | Status: AC | PRN
Start: 2023-04-06 — End: 2023-04-06
  Administered 2023-04-06: 2 mL via INTRA_ARTICULAR

## 2023-04-06 MED ORDER — METHYLPREDNISOLONE ACETATE 40 MG/ML IJ SUSP
40.0000 mg | INTRAMUSCULAR | Status: AC | PRN
Start: 2023-04-06 — End: 2023-04-06
  Administered 2023-04-06: 40 mg via INTRA_ARTICULAR

## 2023-04-06 NOTE — Progress Notes (Addendum)
Office Visit Note   Patient: [name]           Date of Birth: [birth year]           MRN: [id number] Visit Date: 04/06/2023              Requested by: Marcine Matar, MD [address] [place],  [place] [address] PCP: Marcine Matar, MD  Medical Resident, Sports Medicine Fellow - Attending Physician Addendum:   I have independently interviewed and examined the patient myself. I have discussed the above with the original author and agree with their documentation. My edits for correction/addition/clarification have been made, see any changes above and below.   In summary, very pleasant 72 year old female with chronic right shoulder pain.  Exam and ultrasound does suggest pain is emanating from some rotator cuff arthropathy in the intra-articular shoulder joint.  Through shared decision-making, did proceed with glenohumeral joint injection with Dr. Benjiman Core and myself.  Postinjection protocol discussed.  She may use ice, Tylenol or over-the-counter anti-inflammatories as needed for pain control. I would like to see her back in about 2 weeks for further evaluation.  She did have some mild pain over the [facility] joint with arthritic change, but I think this is to a lesser degree the component of her pain.  If for some reason she still has pain directly emanating over that region, we could consider additional injection in this region if needed. F/u 2-weeks and then will get her back to seeing Dr. Magnus Ivan.   Madelyn Brunner, DO Primary Care Sports Medicine Physician  [facility] - Orthopedics   Assessment & Plan: Visit Diagnoses:  1. Chronic right shoulder pain   2. Arthritis of right acromioclavicular joint    Plan: Patient's shoulder ultrasound does not show any signs of rotator cuff concern.  There is some noted A/C joint mild effusion could be from chronic degenerative changes.  Patient is nontender over the [facility] joint so less likely this is coming from the [facility] joint.   Patient does have some notable humeral head spurring which would be consistent with degenerative changes.  At this time, patient would likely benefit the most from glenohumeral joint injection given her symptoms and exam.  Patient was able to tolerated procedure well without any difficulty.  Given that patient may have a component of AC joint pain, will have patient follow-up in 2 weeks, at that time we will reevaluate shoulder pain and hopeful improvement from injection.  If patient is still dealing with some pain, can consider AC joint injection at that time  Follow-Up Instructions: Return in about 2 weeks (around 04/20/2023) for for R-shoulder (30-mins for poss inj) w/Brooks.   Orders:  Orders Placed This Encounter  Procedures   Large Joint Inj: R glenohumeral   US Guided Needle Placement - No Linked Charges   [place] Extrem Up Right Comp   Meds ordered this encounter  Medications   bupivacaine (MARCAINE) 0.25 % (with pres) injection 2 mL   lidocaine (XYLOCAINE) 1 % (with pres) injection 2 mL   methylPREDNISolone acetate (DEPO-MEDROL) injection 40 mg    Procedures: Large Joint Inj: R glenohumeral on 04/06/2023 2:27 PM Details: 22 G 3.5 in needle, ultrasound-guided Medications: 2 mL lidocaine 1 %; 2 mL bupivacaine 0.25 %; 40 mg methylPREDNISolone acetate 40 MG/ML  US-guided glenohumeral joint injection, Right shoulder After discussion on risks/benefits/indications, informed verbal consent was obtained. A timeout was then performed. The patient was positioned lying lateral  recumbent on examination table. The patient's shoulder was prepped with betadine and multiple alcohol swabs and utilizing ultrasound guidance, the patient's glenohumeral joint was identified on ultrasound. Using ultrasound guidance a 22-gauge, 3.5 inch needle with a mixture of 2:2:1 cc's lidocaine:bupivicaine:depomedrol was directed from a lateral to medial direction via in-plane technique into the glenohumeral joint with  visualization of appropriate spread of injectate into the joint. Patient tolerated the procedure well without immediate complications.      Procedure, treatment alternatives, risks and benefits explained, specific risks discussed. Consent was given by the patient. Immediately prior to procedure a time out was called to verify the correct patient, procedure, equipment, support staff and site/side marked as required. Patient was prepped and draped in the usual sterile fashion.       Clinical Data: No additional findings.   Subjective: Chief Complaint  Patient presents with   Right Shoulder - Pain    Patient presenting with right shoulder pain that is more pronounced at night.  Patient states that she has been dealing with some right shoulder pain for a while now and that it is difficult to sleep at night.  Pay states that she likes sleeping on her sides however because of the pain it has been intolerable.  Patient has been seeing Dr. Magnus Ivan who recommended patient come for ultrasound-guided injection.  Patient states that the pain is located more anteriorly and feels like it goes deep into the shoulder.  Patient is scheduled for surgery with Dr. Magnus Ivan regarding trigger finger in a few weeks.  Patient is a diabetic and most recent A1c was 7.3.    Review of Systems   Objective:  Physical Exam  Ortho Exam  Shoulder, Right:  No  skin changes, erythema, or ecchymosis noted. No evidence of bony deformity, asymmetry, or muscle atrophy; No tenderness over long head of biceps (bicipital groove). Mild TTP at [facility] joint. Patient has pain with passive range of motion with abduction, external rotation and adduction.  There is appropriate range of motion with internal rotation.  Patient has pain with abduction, internal rotation, external rotation and adduction against active range of motion., Thumb to T12 with mild tenderness. Strength 4/5 throughout. No abnormal scapular function observed.  Sensation to light touch intact. Peripheral pulses intact. [name]   Special Tests:   - Painful Arc present at 40-180 degrees   - Empty can: POS   - Int/Ext Rotation test: NEG/POS   - Crossarm Adduction test: POS   - Hawkins: POS   - Neer test: NEG   - O'brien's test: POS   - Yergason's: NEG   - Speeds test: NEG Specialty Comments:  No specialty comments available.  Imaging: [place] Extrem Up Right Comp  Result Date: 04/06/2023 MSK Complete US of Shoulder, Right Patient was seated on exam table and shoulder US examination was performed using high frequency linear probe. The biceps tendon was visualized within the bicipital groove in both longitudinal and transverse axis with no signs of fluid or hypoechoic changes. Tendon fibers intact without signs of irregularity. The subscapularis tendon was visualized in both longitudinal and transverse axis with intact tendon inserting at the inferior lesser tubercle of the humerus. No signs of tear, no hypoechoic changes or tissue irregularity were seen. The supraspinatus tendon was visualized in longitudinal, transverse, and dynamic views. No signs of tear with the tendon inserting at the superior facet of the greater tubercle of the humerus, no hypoechoic changes noted, there was some calcification of the tendon noted  likely from a previous injury. The infraspinatus and teres minor tendons were visualized in both longitudinal and transverse axis with tendon insertion at the middle facet of the great tubercle of the humerus with no signs of tearing, hypoechoic changes or tissue irregularity seen. The posterior glenohumeral joint was visualized with proper alignment. The [facility] Joint was visualized with hypoechoic changes noted likely consistent w/ bursal distension. No significant bony spurs/arthritic changes.   Overall, rotator cuff tendons and muscles appear appropriate without any acute changes, there is some noted insertional tendinopathy of the supraspinatus with  prior calcification.  There is some mild hypoechoic changes at the [facility] joint with some fluid. Glenohumeral joint appears appropriate, there is some mild spurring of the humeral head noted.    PMFS History: Patient Active Problem List   Diagnosis Date Noted   Chronic right shoulder pain 03/28/2023   Neck pain 03/28/2023   Primary osteoarthritis of right shoulder 08/10/2022   CAP (community acquired pneumonia) 06/22/2022   Stage 3b chronic kidney disease (CKD) (HCC) 06/22/2022   Trigger finger, right ring finger 12/02/2021   DM type 2 with diabetic peripheral neuropathy (HCC) 07/26/2021   Bilateral low back pain with bilateral sciatica 07/26/2021   Gait abnormality 07/26/2021   Controlled substance agreement signed 12/31/2020   Shoulder stiffness, left 07/07/2020   Status post lumbar spine surgery for decompression of spinal cord 06/09/2020   Lumbar stenosis 05/27/2020   Spinal stenosis of lumbar region 05/21/2019   Complete tear of left rotator cuff 05/16/2019   Uncontrolled type 2 diabetes mellitus with hyperglycemia (HCC) 12/19/2018   Microalbuminuria 04/08/2018   Genetic testing 09/25/2017   Family history of breast cancer    Family history of prostate cancer    Malignant neoplasm of upper-outer quadrant of right breast in female, estrogen receptor positive (HCC) 06/26/2017   Lumbar herniated disc 04/15/2015   Abnormal nuclear stress test 04/03/2015   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 Multiple colonic polyps status post colonoscopy in October 2007, consistent with tubular adenoma, tubulovillous adenoma with no high-grade dysplasia or malignancy identified.    Constipation    Coronary artery disease    a. s/p CABG 06/2010: S-D2; S-PDA (at time of MV surgery).   Depression    Diabetes mellitus    type II   Family history of breast cancer    Family history of prostate cancer    Genetic testing 09/25/2017   Multi-Cancer panel (83 genes) @ [facility] - No pathogenic mutations detected   GERD (gastroesophageal reflux disease)    Hx of transient ischemic attack (TIA)    a. See stroke section.   Hyperlipidemia    Hypertension    Hypoxia    a. Has history of acute hypoxic respiratory failure in the setting of bronchitis/PNA or prior admissions.   MRSA infection    a. History of recurrent skin infection and soft tissue abscesses, with MRSA positive in the past.  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 mitral valve - s/p resection and MV repair 06/2010 Dr. Cornelius Moras.   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 Distal R MCA.   Stroke [facility])    a. 11/2009: mitral mass diagnosed at this time, also has distal R MCA stenosis, tx with coumadin. b. Readmitted 05/2010 with TIA symptoms - had not been taking Coumadin. s/p MV surgery 06/2010. Coumadin stopped 2013 after review of chart by Dr. Jens Som since mass was removed (stroke felt possibly related to this).     Family History  Problem Relation Age of Onset   Colon cancer Mother        dx 64s; deceased 69   Irritable bowel syndrome Mother    Prostate cancer Father        deceased 31s   Cirrhosis Sister        died of GI bleed associated with cirrhosis of the liver   Stroke Brother    Prostate cancer Brother 30       deceased 48   Diabetes Brother        x3   Kidney disease  Brother    Cancer Brother 10       unk. type   Breast cancer Cousin        several maternal 1st and 2nd cousins with breast cancer   Rectal cancer Neg Hx    Stomach cancer Neg Hx    Esophageal cancer Neg Hx     Past Surgical History:  Procedure Laterality Date   BREAST BIOPSY Right 08/26/2019   benign   BREAST LUMPECTOMY Right    BREAST LUMPECTOMY WITH RADIOACTIVE SEED AND SENTINEL LYMPH NODE BIOPSY Right 07/20/2017   Procedure: RIGHT BREAST LUMPECTOMY WITH RADIOACTIVE SEED AND SENTINEL LYMPH NODE BIOPSY;  Surgeon: Ovidio Kin, MD;  Location: [facility] OR;  Service: General;  Laterality: Right;   CARDIAC CATHETERIZATION  04/03/2015   Procedure: Left Heart Cath and Cors/Grafts Angiography;  Surgeon: Peter M Swaziland, MD;  Location: [facility] INVASIVE CV LAB;  Service: Cardiovascular;;   CORONARY ARTERY BYPASS GRAFT  1/12   history of ankle fractures requiring surgery     LUMBAR LAMINECTOMY/DECOMPRESSION MICRODISCECTOMY Left 04/15/2015   Procedure: LUMBAR LAMINECTOMY/DECOMPRESSION MICRODISCECTOMY 1 LEVEL;  Surgeon: Tressie Stalker, MD;  Location: [facility] NEURO ORS;  Service: Neurosurgery;  Laterality: Left;  Left L23 microdiskectomy   LUMBAR LAMINECTOMY/DECOMPRESSION MICRODISCECTOMY N/A 05/27/2020   Procedure: L2-3, L3-4  decompression;  Surgeon: Eldred Manges, MD;  [facility];  Service: Orthopedics;  Laterality: N/A;   MV repair and resection of mass  1/12   RESECTION DISTAL CLAVICAL Left 05/16/2019   Procedure: RESECTION DISTAL CLAVICAL;  Surgeon: Kathryne Hitch, MD;  Location: [facility];  Service: Orthopedics;  Laterality: Left;   SHOULDER ARTHROSCOPY WITH ROTATOR CUFF REPAIR AND SUBACROMIAL DECOMPRESSION Left 05/16/2019   Procedure: SHOULDER ARTHROSCOPY WITH ROTATOR CUFF REPAIR AND SUBACROMIAL DECOMPRESSION;  Surgeon: Kathryne Hitch, MD;  Location: [facility];  Service: Orthopedics;  Laterality: Left;   TONSILLECTOMY  1971   TOTAL ABDOMINAL HYSTERECTOMY   1978   TRIGGER FINGER RELEASE Right 12/02/2021   Procedure: RIGHT RING FINGER A-1 PULLEY RELEASE;  Surgeon: Kathryne Hitch, MD;  Location:  SURGERY CENTER;  Service: Orthopedics;  Laterality: Right;   VESICOVAGINAL FISTULA CLOSURE W/  TAH     Social History   Occupational History    Employer: UNEMPLOYED  Tobacco Use   Smoking status: Former    Current packs/day: 0.00    Types: Cigarettes    Quit date: 10/14/2010    Years since quitting: 12.4   Smokeless tobacco: Never  Vaping Use   Vaping status: Never Used  Substance and Sexual Activity   Alcohol use: Yes    Comment: social   Drug use: No   Sexual activity: Not Currently

## 2023-04-10 ENCOUNTER — Telehealth: Payer: Self-pay | Admitting: Internal Medicine

## 2023-04-10 NOTE — Telephone Encounter (Signed)
Patient has called and states she needs a Handicap Placard form filled out by her PCP. Please advise and follow back up with patient @ #  [phone].

## 2023-04-10 NOTE — Telephone Encounter (Signed)
Spoke with pt and she is scheduled to see Dr. Maisie Fus with [facility] 04/24/23 at  11:20am. Pt knows to arrive prior to appt, she is aware of appt.

## 2023-04-10 NOTE — Telephone Encounter (Signed)
Inbound call from patient requesting a call to discuss a referral for a colon mass. Patient states she has not heard anything regarding referral. Requesting a call back. Please advise, thank you.

## 2023-04-11 ENCOUNTER — Other Ambulatory Visit: Payer: Self-pay | Admitting: Internal Medicine

## 2023-04-11 DIAGNOSIS — F32A Depression, unspecified: Secondary | ICD-10-CM

## 2023-04-11 DIAGNOSIS — E1159 Type 2 diabetes mellitus with other circulatory complications: Secondary | ICD-10-CM

## 2023-04-11 NOTE — Telephone Encounter (Signed)
Requested Prescriptions  Pending Prescriptions Disp Refills   buPROPion (WELLBUTRIN XL) 150 MG 24 hr tablet [Pharmacy Med Name: buPROPion HCl ER (XL) Oral Tablet Extended Release 24 Hour 150 MG] 180 tablet 3    Sig: TAKE 1 TABLET TWICE DAILY     Psychiatry: Antidepressants - bupropion Failed - 04/11/2023  8:38 AM      Failed - Cr in normal range and within 360 days    Creatinine  Date Value Ref Range Status  08/09/2022 90.2 20.0 - 300.0 mg/dL Final   Creat  Date Value Ref Range Status  04/28/2016 1.17 (H) 0.50 - 0.99 mg/dL Final    Comment:      For patients > or = 74 years of age: The upper reference limit for Creatinine is approximately 13% higher for people identified as African-American.      Creatinine, Ser  Date Value Ref Range Status  02/23/2023 1.90 (H) 0.40 - 1.20 mg/dL Final   Creatinine, POC  Date Value Ref Range Status  10/04/2022 68.1 mg/dL Final         Failed - Last BP in normal range    BP Readings from Last 1 Encounters:  03/28/23 (!) 166/74         Passed - AST in normal range and within 360 days    AST  Date Value Ref Range Status  11/07/2022 24 0 - 40 IU/L Final         Passed - ALT in normal range and within 360 days    ALT  Date Value Ref Range Status  11/07/2022 21 0 - 32 IU/L Final         Passed - Valid encounter within last 6 months    Recent Outpatient Visits           4 weeks ago Type 2 diabetes mellitus with obesity (HCC)   [place] [facility] Marcine Matar, MD   1 month ago Encounter for medication review   [facility] [place], [place] L, RPH-CPP   1 month ago Acute pain of right knee   [facility] [place], [place] W, NP   4 months ago Hypertension associated with diabetes [facility])   [facility] [place], [place] L, RPH-CPP   5 months ago Type 2 diabetes mellitus with morbid obesity (HCC)    [place] [facility] Marcine Matar, MD       Future Appointments             In 3 months Marcine Matar, MD [facility]             hydrALAZINE (APRESOLINE) 100 MG tablet [Pharmacy Med Name: hydrALAZINE HCl Oral Tablet 100 MG] 180 tablet 3    Sig: TAKE 1 TABLET TWICE DAILY     Cardiovascular:  Vasodilators Failed - 04/11/2023  8:38 AM      Failed - HCT in normal range and within 360 days    Hematocrit  Date Value Ref Range Status  11/07/2022 32.2 (L) 34.0 - 46.6 % Final         Failed - HGB in normal range and within 360 days    Hemoglobin  Date Value Ref Range Status  11/07/2022 10.0 (L) 11.1 - 15.9 g/dL Final         Failed - ANA Screen, Ifa,  Serum in normal range and within 360 days    Anti Nuclear Antibody(ANA)  Date Value Ref Range Status  12/23/2009 NEGATIVE NEGATIVE Final         Failed - Last BP in normal range    BP Readings from Last 1 Encounters:  03/28/23 (!) 166/74         Passed - RBC in normal range and within 360 days    RBC  Date Value Ref Range Status  11/07/2022 4.03 3.77 - 5.28 x10E6/uL Final  06/24/2022 3.54 (L) 3.87 - 5.11 MIL/uL Final         Passed - WBC in normal range and within 360 days    WBC  Date Value Ref Range Status  11/07/2022 6.1 3.4 - 10.8 x10E3/uL Final  06/24/2022 12.8 (H) 4.0 - 10.5 K/uL Final         Passed - PLT in normal range and within 360 days    Platelets  Date Value Ref Range Status  11/07/2022 259 150 - 450 x10E3/uL Final         Passed - Valid encounter within last 12 months    Recent Outpatient Visits           4 weeks ago Type 2 diabetes mellitus with obesity (HCC)   [place] [facility] Marcine Matar, MD   1 month ago Encounter for medication review   [facility] [place], [place] L, RPH-CPP   1 month ago Acute pain of right knee   [facility] [place], [place] W, NP   4 months ago Hypertension associated with diabetes [facility])   [place] [facility] [place], [place] L, RPH-CPP   5 months ago Type 2 diabetes mellitus with morbid obesity [facility])   [facility] Marcine Matar, MD       Future Appointments             In 3 months La[place] Benes, Binnie Rail, MD [facility]

## 2023-04-12 ENCOUNTER — Ambulatory Visit: Payer: Medicare PPO | Admitting: Podiatry

## 2023-04-12 ENCOUNTER — Encounter: Payer: Self-pay | Admitting: Podiatry

## 2023-04-12 VITALS — Ht 68.0 in | Wt 256.0 lb

## 2023-04-12 DIAGNOSIS — L603 Nail dystrophy: Secondary | ICD-10-CM | POA: Diagnosis not present

## 2023-04-12 NOTE — Progress Notes (Signed)
Subjective:  Patient ID: [name], female    DOB: 12-13-1950,  MRN: [id number]  Chief Complaint  Patient presents with   Diabetes    Injury to right hallux and second left toe, diabetic blister, nail trim, callus on left 5th    72 y.o. female presents with the above complaint.  Patient presents with complaint of right hallux pain with nail contusion.  Patient states that she had injury to the right great toe.  The nail appears to be damaged she has not seen MRIs prior to seeing me pain scale 7 out of 10 dull aching nature.  She is overnight and the nail removed.  She has not seen an.  Pain scale 7 out of 10 dull achy in nature   Review of Systems: Negative except as noted in the HPI. Denies N/V/F/Ch.  Past Medical History:  Diagnosis Date   Abscess in epidural space of L2-L5 lumbar spine 03/2006   Anemia, iron deficiency    Arthritis    Breast cancer (HCC) 06/12/2017   right breast   CAD (coronary artery disease), native coronary artery 03/30/2015   Cataract    Colon polyp    a. Multiple colonic polyps status post colonoscopy in October 2007, consistent with tubular adenoma, tubulovillous adenoma with no high-grade dysplasia or malignancy identified.    Constipation    Coronary artery disease    a. s/p CABG 06/2010: S-D2; S-PDA (at time of MV surgery).   Depression    Diabetes mellitus    type II   Family history of breast cancer    Family history of prostate cancer    Genetic testing 09/25/2017   Multi-Cancer panel (83 genes) @ [facility] - No pathogenic mutations detected   GERD (gastroesophageal reflux disease)    Hx of transient ischemic attack (TIA)    a. See stroke section.   Hyperlipidemia    Hypertension    Hypoxia    a. Has history of acute hypoxic respiratory failure in the setting of bronchitis/PNA or prior admissions.   MRSA infection    a. History of recurrent skin infection and soft tissue abscesses, with MRSA positive in the past.   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 mitral valve - s/p resection and MV repair 06/2010 Dr. Cornelius Moras.   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 Distal R MCA.   Stroke [facility])    a. 11/2009: mitral mass diagnosed at this time, also has distal R MCA stenosis, tx with coumadin. b. Readmitted 05/2010 with TIA symptoms - had not been taking Coumadin. s/p MV surgery 06/2010. Coumadin stopped 2013 after review of chart by Dr. Jens Som since mass was removed (stroke felt possibly related to this).     Current Outpatient Medications:    albuterol (VENTOLIN HFA) 108 (90 Base) MCG/ACT inhaler, Inhale 2 puffs into the lungs every 6 (six) hours as needed for wheezing or shortness of breath., Disp: 1 each, Rfl: 0   amLODipine (NORVASC) 10 MG tablet, TAKE 1 TABLET EVERY DAY, Disp: 90 tablet, Rfl: 2   aspirin EC 81 MG tablet, Take 1 tablet (81 mg total) by mouth daily., Disp: 30 tablet, Rfl: 5   atorvastatin (LIPITOR) 80 MG tablet, TAKE 1 TABLET EVERY DAY (DOSE INCREASE) (Patient taking differently: Take 80 mg by mouth daily.), Disp: 90 tablet, Rfl:  2   buPROPion (WELLBUTRIN XL) 150 MG 24 hr tablet, TAKE 1 TABLET TWICE DAILY, Disp: 180 tablet, Rfl: 1   carvedilol (COREG) 25 MG tablet, TAKE 1 TABLET TWICE DAILY, Disp: 180 tablet, Rfl: 3   celecoxib (CELEBREX) 50 MG capsule, Take 1 capsule (50 mg total) by mouth 2 (two) times daily as needed for pain., Disp: 60 capsule, Rfl: 3   diclofenac Sodium (VOLTAREN) 1 % GEL, APPLY 2 GRAMS TOPICALLY 4 (FOUR) TIMES DAILY AS NEEDED., Disp: 100 g, Rfl: 0   DULoxetine (CYMBALTA) 30 MG capsule, Take 1 capsule (30 mg total) by mouth daily., Disp: 180 capsule, Rfl: 1   FARXIGA 10 MG TABS tablet, Take 10 mg by mouth daily., Disp: , Rfl:    gabapentin (NEURONTIN) 300 MG capsule, Take 1 capsule (300 mg total) by mouth 3 (three) times daily., Disp: 90 capsule, Rfl: 5    hydrALAZINE (APRESOLINE) 100 MG tablet, TAKE 1 TABLET TWICE DAILY, Disp: 180 tablet, Rfl: 2   insulin glargine (LANTUS) 100 UNIT/ML Solostar Pen, Inject 10 Units into the skin at bedtime., Disp: 15 mL, Rfl: 3   Insulin Pen Needle (PEN NEEDLES) 31G X 8 MM MISC, UAD, Disp: 100 each, Rfl: 6   letrozole (FEMARA) 2.5 MG tablet, TAKE 1 TABLET EVERY DAY, Disp: 90 tablet, Rfl: 3   methocarbamol (ROBAXIN) 500 MG tablet, TAKE 1 TABLET EVERY 8 HOURS AS NEEDED FOR MUSCLE SPASMS., Disp: 270 tablet, Rfl: 0   pantoprazole (PROTONIX) 40 MG tablet, TAKE 1 TABLET EVERY DAY, Disp: 90 tablet, Rfl: 2   traMADol (ULTRAM) 50 MG tablet, Take 1 tablet (50 mg total) by mouth every 12 (twelve) hours as needed., Disp: 40 tablet, Rfl: 0   traZODone (DESYREL) 50 MG tablet, Take 1 tablet (50 mg total) by mouth at bedtime as needed. for sleep, Disp: 90 tablet, Rfl: 1   valsartan-hydrochlorothiazide (DIOVAN-HCT) 160-25 MG tablet, Take 1 tablet by mouth daily., Disp: 90 tablet, Rfl: 2  Social History   Tobacco Use  Smoking Status Former   Current packs/day: 0.00   Types: Cigarettes   Quit date: 10/14/2010   Years since quitting: 12.5  Smokeless Tobacco Never    No Known Allergies Objective:  There were no vitals filed for this visit. Body mass index is 38.92 kg/m. Constitutional Well developed. Well nourished.  Vascular Dorsalis pedis pulses palpable bilaterally. Posterior tibial pulses palpable bilaterally. Capillary refill normal to all digits.  No cyanosis or clubbing noted. Pedal hair growth normal.  Neurologic Normal speech. Oriented to person, place, and time. Epicritic sensation to light touch grossly present bilaterally.  Dermatologic Pain on palpation of the entire/total nail on 1st digit of the right No other open wounds. No skin lesions.  Orthopedic: Normal joint ROM without pain or crepitus bilaterally. No visible deformities. No bony tenderness.   Radiographs: None Assessment:   1. Nail  dystrophy    Plan:  Patient was evaluated and treated and all questions answered.  Nail contusion/dystrophy hallux, she is yeah I hadright -Patient elects to proceed with minor surgery to remove entire toenail today. Consent reviewed and signed by patient. -Entire/total nail excised. See procedure note. -Educated on post-procedure care including soaking. Written instructions provided and reviewed. -Patient to follow up in 2 weeks for nail check. -Encouraged to do Betadine wet-to-dry dressing changes daily to heal faster.  She states understanding.  Procedure: Excision of entire/total nail  Location: Right 1st toe digit Anesthesia: Lidocaine 1% plain; 1.5 mL and Marcaine 0.5% plain; 1.5 mL,  digital block. Skin Prep: Betadine. Dressing: Silvadene; telfa; dry, sterile, compression dressing. Technique: Following skin prep, the toe was exsanguinated and a tourniquet was secured at the base of the toe. The affected nail border was freed and excised. The tourniquet was then removed and sterile dressing applied. Disposition: Patient tolerated procedure well. Patient to return in 2 weeks for follow-up.   No follow-ups on file.

## 2023-04-17 DIAGNOSIS — I1A0 Resistant hypertension: Secondary | ICD-10-CM | POA: Diagnosis not present

## 2023-04-17 DIAGNOSIS — N1832 Chronic kidney disease, stage 3b: Secondary | ICD-10-CM | POA: Diagnosis not present

## 2023-04-17 DIAGNOSIS — D509 Iron deficiency anemia, unspecified: Secondary | ICD-10-CM | POA: Diagnosis not present

## 2023-04-17 DIAGNOSIS — E1122 Type 2 diabetes mellitus with diabetic chronic kidney disease: Secondary | ICD-10-CM | POA: Diagnosis not present

## 2023-04-20 ENCOUNTER — Telehealth: Payer: Self-pay | Admitting: Sports Medicine

## 2023-04-20 NOTE — Telephone Encounter (Signed)
Patient called advised she do not have transportation to come in today.  Patient said her shoulder feels fine and she haven't had any problem or pain. The number to contact patient is [phone]

## 2023-04-24 ENCOUNTER — Ambulatory Visit: Payer: Self-pay | Admitting: General Surgery

## 2023-04-24 DIAGNOSIS — K6389 Other specified diseases of intestine: Secondary | ICD-10-CM | POA: Diagnosis not present

## 2023-04-24 NOTE — H&P (Signed)
REFERRING PHYSICIAN:  Marcine Matar, MD   PROVIDER:  Elenora Gamma, MD   MRN: [id number] DOB: 03-14-1951 DATE OF ENCOUNTER: 04/24/2023   Subjective   Chief Complaint: New Consultation (Colonic mass)       History of Present Illness: [name] is a 72 y.o. female who is seen today as an office consultation at the request of Dr. Laural Benes for evaluation of New Consultation (Colonic mass) .  Patient underwent high risk cancer surveillance with colonoscopy due to history of colon polyps.  This was completed in August 2024.  She was found to have several polyps in the cecum which were removed there was a tumor in the ascending colon which was biopsied and tattooed.  A polyp was removed in transverse colon.  Biopsy of the mass showed tubulovillous adenoma which was negative for high-grade dysplasia.  CT abdomen showed a possible right colon mass with no evidence of metastatic disease.  Patient reports regular bowel habits and abdominal surgery history significant for hysterectomy.     Review of Systems: A complete review of systems was obtained from the patient.  I have reviewed this information and discussed as appropriate with the patient.  See HPI as well for other ROS.     Medical History: Past Medical History Past Medical History: Diagnosis Date  Anemia    Arthritis    Chronic kidney disease    COPD (chronic obstructive pulmonary disease) (CMS/HHS-HCC)    GERD (gastroesophageal reflux disease)    Heart valve disease    History of cancer    History of stroke    Hyperlipidemia    Hypertension         Problem List There is no problem list on file for this patient.     Past Surgical History History reviewed. No pertinent surgical history.     Allergies No Known Allergies    Medications Ordered Prior to Encounter Current Outpatient Medications on File Prior to Visit Medication Sig Dispense Refill  albuterol MDI, PROVENTIL, VENTOLIN, PROAIR, HFA 90  mcg/actuation inhaler Inhale 2 Inhalations into the lungs every 6 (six) hours as needed      amLODIPine (NORVASC) 10 MG tablet Take 1 tablet by mouth once daily      atorvastatin (LIPITOR) 80 MG tablet TAKE 1 TABLET EVERY DAY (DOSE INCREASE)      buPROPion (WELLBUTRIN XL) 150 MG XL tablet Take 1 tablet by mouth 2 (two) times daily      carvediloL (COREG) 25 MG tablet Take 1 tablet by mouth 2 (two) times daily      celecoxib (CELEBREX) 50 MG capsule Take by mouth      diclofenac (VOLTAREN) 1 % topical gel APPLY 2 GRAMS TOPICALLY 4 (FOUR) TIMES DAILY AS NEEDED.      DULoxetine (CYMBALTA) 30 MG DR capsule Take 30 mg by mouth once daily      FARXIGA 10 mg tablet Take 10 mg by mouth once daily      gabapentin (NEURONTIN) 300 MG capsule Take 1 capsule by mouth 3 (three) times daily      hydrALAZINE (APRESOLINE) 100 MG tablet Take 1 tablet by mouth 2 (two) times daily      insulin ASPART (NOVOLOG FLEXPEN) pen injector (concentration 100 units/mL) Inj 4 units subcut with the 2 larges meals of the day      insulin GLARGINE (LANTUS SOLOSTAR) pen injector (concentration 100 units/mL) Inject subcutaneously      letrozole (FEMARA) 2.5 mg tablet Take 1 tablet by  mouth once daily      methocarbamoL (ROBAXIN) 500 MG tablet TAKE 1 TABLET EVERY 8 HOURS AS NEEDED FOR MUSCLE SPASMS.      traMADoL (ULTRAM) 50 mg tablet Take by mouth      traZODone (DESYREL) 50 MG tablet Take 50 mg by mouth at bedtime as needed      valsartan-hydroCHLOROthiazide (DIOVAN-HCT) 160-25 mg tablet Take 1 tablet by mouth once daily        No current facility-administered medications on file prior to visit.      Family History Family History Problem Relation Age of Onset  Colon cancer Mother    Colon cancer Father    Stroke Sister    High blood pressure (Hypertension) Sister    Diabetes Sister    Deep vein thrombosis (DVT or abnormal blood clot formation) Sister        Tobacco Use History Social History    Tobacco  Use Smoking Status Former  Types: Cigarettes Smokeless Tobacco Not on file      Social History Social History    Socioeconomic History  Marital status: Married Tobacco Use  Smoking status: Former     Types: Cigarettes Vaping Use  Vaping status: Unknown Substance and Sexual Activity  Alcohol use: Not Currently  Drug use: Never    Social Drivers of Manufacturing engineer Strain: Low Risk  (01/24/2023)   Received from [facility]   Overall Financial Resource Strain (CARDIA)    Difficulty of Paying Living Expenses: Not hard at all Food Insecurity: No Food Insecurity (01/24/2023)   Received from [facility]   Hunger Vital Sign    Worried About Running Out of Food in the Last Year: Never true    Ran Out of Food in the Last Year: Never true Transportation Needs: No Transportation Needs (01/24/2023)   Received from [facility] - Transportation    Lack of Transportation (Medical): No    Lack of Transportation (Non-Medical): No Physical Activity: Insufficiently Active (01/24/2023)   Received from [facility]   Exercise Vital Sign    Days of Exercise per Week: 3 days    Minutes of Exercise per Session: 30 min Stress: No Stress Concern Present (01/24/2023)   Received from [facility] - Occupational Stress Questionnaire    Feeling of Stress : Not at all Social Connections: Moderately Integrated (01/24/2023)   Received from [facility]   Social Connection and Isolation Panel [NHANES]    Frequency of Communication with Friends and Family: More than three times a week    Frequency of Social Gatherings with Friends and Family: Three times a week    Attends Religious Services: More than 4 times per year    Active Member of Clubs or Organizations: No    Attends Banker Meetings: Never    Marital Status: Married      Objective:     Vitals:   04/24/23 1142 BP: (!) 172/75 Pulse: 86 Temp: 36.8 C (98.2  F) Weight: (!) 117 kg (258 lb) Height: 172.7 cm (5\' 8" ) PainSc: 0-No pain     Exam Gen: NAD CV: RRR Pulm: CTA Abd: soft     Labs, Imaging and Diagnostic Testing: Colonoscopy report reviewed.  Mass does seem malignant appearing.  Area distal to the mass was tattooed.   Assessment and Plan: Diagnoses and all orders for this visit:   Colonic mass -     polyethylene glycol (  MIRALAX) powder; Take 233.75 g by mouth once for 1 dose Take according to your procedure prep instructions. -     bisacodyL (DULCOLAX) 5 mg EC tablet; Take 4 tablets (20 mg total) by mouth once daily as needed for Constipation for up to 1 dose -     metroNIDAZOLE (FLAGYL) 500 MG tablet; Take 2 tablets (1,000 mg total) by mouth 3 (three) times daily for 6 doses Take according to your procedure colon prep instructions -     neomycin 500 mg tablet; Take 2 tablets (1,000 mg total) by mouth 3 (three) times daily for 3 doses Take according to your procedure colon prep instructions       Patient has a malignant appearing polyp in her hepatic flexure.  Biopsies have only shown high-grade dysplasia so far but there is concern for underlying malignancy.  We discussed proceeding with a robotic assisted partial colectomy.  We discussed that most likely this would be a right colectomy.  All questions were answered.  Patient is scheduled for a outpatient orthopedic surgery in 2 weeks.  I believe she could undergo colon surgery while she is recovering from this.   The surgery and anatomy were described to the patient as well as the risks of surgery and the possible complications.  These include: Bleeding, deep abdominal infections and possible wound complications such as hernia and infection, damage to adjacent structures, leak of surgical connections, which can lead to other surgeries and possibly an ostomy, possible need for other procedures, such as abscess drains in radiology, possible prolonged hospital stay, possible diarrhea  from removal of part of the colon, possible constipation from narcotics, possible bowel, bladder or sexual dysfunction if having rectal surgery, prolonged fatigue/weakness or appetite loss, possible early recurrence of of disease, possible complications of their medical problems such as heart disease or arrhythmias or lung problems, death (less than 1%). I believe the patient understands and wishes to proceed with the surgery.    Vanita Panda, MD Colon and Rectal Surgery [facility]

## 2023-05-03 ENCOUNTER — Ambulatory Visit (INDEPENDENT_AMBULATORY_CARE_PROVIDER_SITE_OTHER): Payer: Medicare PPO | Admitting: Podiatry

## 2023-05-03 ENCOUNTER — Encounter (HOSPITAL_BASED_OUTPATIENT_CLINIC_OR_DEPARTMENT_OTHER): Payer: Self-pay | Admitting: Orthopaedic Surgery

## 2023-05-03 ENCOUNTER — Encounter: Payer: Self-pay | Admitting: Podiatry

## 2023-05-03 DIAGNOSIS — L603 Nail dystrophy: Secondary | ICD-10-CM | POA: Diagnosis not present

## 2023-05-03 NOTE — Progress Notes (Signed)
Subjective: [name] is a 72 y.o.  female returns to office today for follow up evaluation after having right 1st total nail avulsion performed. Patient has been soaking using epsom salt and applying topical antibiotic covered with bandaid daily. Patient denies fevers, chills, nausea, vomiting. Denies any calf pain, chest pain, SOB.   Objective:  Vitals: Reviewed  General: Well developed, nourished, in no acute distress, alert and oriented x3   Dermatology: Skin is warm, dry and supple bilateral. right 1st nail bed appears to be clean, dry, with mild granular tissue and surrounding scab. There is no surrounding erythema, edema, drainage/purulence. The remaining nails appear unremarkable at this time. There are no other lesions or other signs of infection present.  Neurovascular status: Intact. No lower extremity swelling; No pain with calf compression bilateral.  Musculoskeletal: Decreased tenderness to palpation of the right 1st nail bed. Muscular strength within normal limits bilateral.   Assesement and Plan: S/p total nail avulsion, doing well.   -Continue soaking in epsom salts twice a day followed by antibiotic ointment and a band-aid. Can leave uncovered at night. Continue this until completely healed.  -If the area has not healed in 2 weeks, call the office for follow-up appointment, or sooner if any problems arise.  -Monitor for any signs/symptoms of infection. Call the office immediately if any occur or go directly to the emergency room. Call with any questions/concerns.  Nicholes Rough, DPM

## 2023-05-09 ENCOUNTER — Inpatient Hospital Stay
Admission: RE | Admit: 2023-05-09 | Discharge: 2023-05-09 | Disposition: A | Discharge status: Home / Self Care | Payer: Medicare PPO | Source: Ambulatory Visit | Attending: Adult Health | Admitting: Adult Health

## 2023-05-09 ENCOUNTER — Encounter (HOSPITAL_BASED_OUTPATIENT_CLINIC_OR_DEPARTMENT_OTHER)
Admission: RE | Admit: 2023-05-09 | Discharge: 2023-05-09 | Disposition: A | Discharge status: Home / Self Care | Payer: Medicare PPO | Source: Ambulatory Visit | Attending: Orthopaedic Surgery | Admitting: Orthopaedic Surgery

## 2023-05-09 DIAGNOSIS — R9431 Abnormal electrocardiogram [ECG] [EKG]: Secondary | ICD-10-CM | POA: Insufficient documentation

## 2023-05-09 DIAGNOSIS — Z01812 Encounter for preprocedural laboratory examination: Secondary | ICD-10-CM | POA: Diagnosis present

## 2023-05-09 DIAGNOSIS — N958 Other specified menopausal and perimenopausal disorders: Secondary | ICD-10-CM | POA: Diagnosis not present

## 2023-05-09 DIAGNOSIS — Z01818 Encounter for other preprocedural examination: Secondary | ICD-10-CM | POA: Diagnosis not present

## 2023-05-09 DIAGNOSIS — M85852 Other specified disorders of bone density and structure, left thigh: Secondary | ICD-10-CM

## 2023-05-09 DIAGNOSIS — Z01810 Encounter for preprocedural cardiovascular examination: Secondary | ICD-10-CM | POA: Diagnosis present

## 2023-05-09 DIAGNOSIS — E2839 Other primary ovarian failure: Secondary | ICD-10-CM | POA: Diagnosis not present

## 2023-05-09 DIAGNOSIS — M8588 Other specified disorders of bone density and structure, other site: Secondary | ICD-10-CM | POA: Diagnosis not present

## 2023-05-09 LAB — BASIC METABOLIC PANEL
Anion gap: 8 (ref 5–15)
BUN: 33 mg/dL — ABNORMAL HIGH (ref 8–23)
CO2: 23 mmol/L (ref 22–32)
Calcium: 8.7 mg/dL — ABNORMAL LOW (ref 8.9–10.3)
Chloride: 108 mmol/L (ref 98–111)
Creatinine, Ser: 1.94 mg/dL — ABNORMAL HIGH (ref 0.44–1.00)
GFR, Estimated: 27 mL/min — ABNORMAL LOW (ref >60)
Glucose, Bld: 152 mg/dL — ABNORMAL HIGH (ref 70–99)
Potassium: 5 mmol/L (ref 3.5–5.1)
Sodium: 139 mmol/L (ref 135–145)

## 2023-05-09 NOTE — Progress Notes (Signed)

## 2023-05-10 ENCOUNTER — Telehealth: Payer: Self-pay

## 2023-05-10 NOTE — H&P (Signed)
[name] is an 72 y.o. female.   Chief Complaint: Left index finger triggering and pain HPI: The patient is a 72 year old female with active triggering of her left index finger.  There is pain over the A1 pulley.  She has tried and failed conservative treatment for the trigger finger and does wish to proceed with a trigger finger release of the A1 pulley.  Again, this involves her left index finger.  She has had a similar issue before with her right hand and has had a trigger finger release on the right side.  Past Medical History:  Diagnosis Date   Abscess in epidural space of L2-L5 lumbar spine 03/2006   Anemia, iron deficiency    Arthritis    Breast cancer (HCC) 06/12/2017   right breast   CAD (coronary artery disease), native coronary artery 03/30/2015   Cataract    Colon polyp    a. Multiple colonic polyps status post colonoscopy in October 2007, consistent with tubular adenoma, tubulovillous adenoma with no high-grade dysplasia or malignancy identified.    Constipation    Coronary artery disease    a. s/p CABG 06/2010: S-D2; S-PDA (at time of MV surgery).   Depression    Diabetes mellitus    type II   Family history of breast cancer    Family history of prostate cancer    Genetic testing 09/25/2017   Multi-Cancer panel (83 genes) @ [facility] - No pathogenic mutations detected   GERD (gastroesophageal reflux disease)    Hx of transient ischemic attack (TIA)    a. See stroke section.   Hyperlipidemia    Hypertension    Hypoxia    a. Has history of acute hypoxic respiratory failure in the setting of bronchitis/PNA or prior admissions.   MRSA infection    a. History of recurrent skin infection and soft tissue abscesses, with MRSA positive in the past.   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 mitral valve - s/p resection and MV repair 06/2010 Dr. Cornelius Moras.   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 Distal R MCA.   Stroke [facility])    a. 11/2009: mitral mass diagnosed at this time, also has distal R MCA stenosis, tx with coumadin. b. Readmitted 05/2010 with TIA symptoms - had not been taking Coumadin. s/p MV surgery 06/2010. Coumadin stopped 2013 after review of chart by Dr. Jens Som since mass was removed (stroke felt possibly related to this).     Past Surgical History:  Procedure Laterality Date   BREAST BIOPSY Right 08/26/2019   benign   BREAST LUMPECTOMY Right    BREAST LUMPECTOMY WITH RADIOACTIVE SEED AND SENTINEL LYMPH NODE BIOPSY Right 07/20/2017   Procedure: RIGHT BREAST LUMPECTOMY WITH RADIOACTIVE SEED AND SENTINEL LYMPH NODE BIOPSY;  Surgeon: Ovidio Kin, MD;  Location: [facility] OR;  Service: General;  Laterality: Right;   CARDIAC CATHETERIZATION  04/03/2015   Procedure: Left Heart Cath and Cors/Grafts Angiography;  Surgeon: Peter M Swaziland, MD;  Location: [facility] INVASIVE CV LAB;  Service: Cardiovascular;;   CORONARY ARTERY BYPASS GRAFT  1/12   history of ankle fractures requiring surgery     LUMBAR LAMINECTOMY/DECOMPRESSION MICRODISCECTOMY Left 04/15/2015   Procedure: LUMBAR LAMINECTOMY/DECOMPRESSION MICRODISCECTOMY 1 LEVEL;  Surgeon: Tressie Stalker, MD;  Location: [facility] NEURO ORS;  Service: Neurosurgery;  Laterality: Left;  Left L23 microdiskectomy  LUMBAR LAMINECTOMY/DECOMPRESSION MICRODISCECTOMY N/A 05/27/2020   Procedure: L2-3, L3-4  decompression;  Surgeon: Eldred Manges, MD;  Location: [facility] OR;  Service: Orthopedics;  Laterality: N/A;   MV repair and resection of mass  1/12   RESECTION DISTAL CLAVICAL Left 05/16/2019   Procedure: RESECTION DISTAL CLAVICAL;  Surgeon: Kathryne Hitch, MD;  Location: [facility];  Service: Orthopedics;  Laterality: Left;   SHOULDER ARTHROSCOPY WITH ROTATOR CUFF REPAIR AND SUBACROMIAL DECOMPRESSION Left 05/16/2019   Procedure: SHOULDER ARTHROSCOPY WITH ROTATOR CUFF REPAIR AND SUBACROMIAL  DECOMPRESSION;  Surgeon: Kathryne Hitch, MD;  Location: [facility];  Service: Orthopedics;  Laterality: Left;   TONSILLECTOMY  1971   TOTAL ABDOMINAL HYSTERECTOMY  1978   TRIGGER FINGER RELEASE Right 12/02/2021   Procedure: RIGHT RING FINGER A-1 PULLEY RELEASE;  Surgeon: Kathryne Hitch, MD;  Location: [facility];  Service: Orthopedics;  Laterality: Right;   VESICOVAGINAL FISTULA CLOSURE W/ TAH      Family History  Problem Relation Age of Onset   Colon cancer Mother        dx 96s; deceased 60   Irritable bowel syndrome Mother    Prostate cancer Father        deceased 37s   Cirrhosis Sister        died of GI bleed associated with cirrhosis of the liver   Stroke Brother    Prostate cancer Brother 32       deceased 43   Diabetes Brother        x3   Kidney disease Brother    Cancer Brother 45       unk. type   Breast cancer Cousin        several maternal 1st and 2nd cousins with breast cancer   Rectal cancer Neg Hx    Stomach cancer Neg Hx    Esophageal cancer Neg Hx    Social History:  reports that she quit smoking about 12 years ago. Her smoking use included cigarettes. She has never used smokeless tobacco. She reports current alcohol use. She reports that she does not use drugs.  Allergies: No Known Allergies  No medications prior to admission.    Results for orders placed or performed during the hospital encounter of 05/11/23 (from the past 48 hour(s))  Basic metabolic panel     Status: Abnormal   Collection Time: 05/09/23  2:30 PM  Result Value Ref Range   Sodium 139 135 - 145 mmol/L   Potassium 5.0 3.5 - 5.1 mmol/L   Chloride 108 98 - 111 mmol/L   CO2 23 22 - 32 mmol/L   Glucose, Bld 152 (H) 70 - 99 mg/dL    Comment: Glucose reference range applies only to samples taken after fasting for at least 8 hours.   BUN 33 (H) 8 - 23 mg/dL   Creatinine, Ser 4.09 (H) 0.44 - 1.00 mg/dL   Calcium 8.7 (L) 8.9 - 10.3 mg/dL   GFR,  Estimated 27 (L) >60 mL/min    Comment: (NOTE) Calculated using the CKD-EPI Creatinine Equation (2021)    Anion gap 8 5 - 15    Comment: Performed at [facility], [address], [place], [place] [address]   DG Bone Density  Result Date: 05/09/2023 EXAM: DUAL X-RAY ABSORPTIOMETRY (DXA) FOR BONE MINERAL DENSITY IMPRESSION: Referring Physician:  Loa Socks Your patient completed a bone mineral density test using GE Lunar iDXA system (analysis version: 16). Technologist: BEC  PATIENT: Name: Laprincess, Mcshan Patient ID: [id number] Birth Date: June 15, 1951 Height: 66.5 in. Sex: Female Measured: 05/09/2023 Weight: 262.2 lbs. Indications: Advanced Age, Albuterol, Breast Cancer History, Cymbalta, Estrogen Deficient, Femara, Gabapentin, History of Fracture (Adult) (V15.51), Hysterectomy, Insulin for Diabetes, Pantoprazole, Postmenopausal, Wellbutrin Fractures: Right Ankle Treatments: Hormone Therapy For Cancer, Multivitamin ASSESSMENT: The BMD measured at Femur Neck Left is 0.747 g/cm2 with a T-score of -2.1. This patient's diagnostic category is LOW BONE MASS/OSTEOPENIA according to World Health Organization [facility]) criteria. L3 & L4 were excluded due to degenerative changes. The quality of the exam is limited by patient body habitus. Comparison to 05/28/2018. Since the prior study, there has been a SIGNIFICANT INCREASE in bone mineral density of the lumbar spine (+5.2%) which may be secondary to degenerative changes. Since the prior study, there has been NO SIGNIFICANT CHANGE in bone mineral density of the hips. Site Region Measured Date Measured Age [age]A BMD Significant CHANGE T-score DualFemur Neck Left 05/09/2023 71.9 -2.1 0.747 g/cm2 * DualFemur Neck Left  05/28/2018    66.9         -1.7    0.799 g/cm2 AP Spine L1-L2 05/09/2023 73.9 -0.1 1.148 g/cm2 * AP Spine  L1-L2      05/28/2018    68.9         -0.6    1.091 g/cm2 DualFemur Total Mean 05/09/2023    73.9         -1.3    0.847 g/cm2 DualFemur  Total Mean 05/28/2018    68.9         -1.1    0.864 g/cm2 World Health Organization [facility]) criteria for post-menopausal, Caucasian Women: Normal       T-score at or above -1 SD Osteopenia   T-score between -1 and -2.5 SD Osteoporosis T-score at or below -2.5 SD RECOMMENDATION: 1. All patients should optimize calcium and vitamin D intake. 2. Consider FDA-approved medical therapies in postmenopausal women and men aged 14 years and older, based on the following: a. A hip or vertebral (clinical or morphometric) fracture. b. T-score = -2.5 at the femoral neck or spine after appropriate evaluation to exclude secondary causes. c. Low bone mass (T-score between -1.0 and -2.5 at the femoral neck or spine) and a 10-year probability of a hip fracture = 3% or a 10-year probability of a major osteoporosis-related fracture = 20% based on the [place]-adapted WHO algorithm. d. Clinician judgment and/or patient preferences may indicate treatment for people with 10-year fracture probabilities above or below these levels. FOLLOW-UP: Patients with diagnosis of osteoporosis or at high risk for fracture should have regular bone mineral density tests.? Patients eligible for Medicare are allowed routine testing every 2 years.? The testing frequency can be increased to one year for patients who have rapidly progressing disease, are receiving or discontinuing medical therapy to restore bone mass, or have additional risk factors. I have reviewed this study and agree with the findings. [facility] FRAX* 10-year Probability of Fracture Based on femoral neck BMD: DualFemur (Left) Major Osteoporotic Fracture: 8.2% Hip Fracture:                1.7% Population:                  [place] (Black) Risk Factors:                History of Fracture (Adult) (V15.51) *FRAX is a Armed forces logistics/support/administrative officer of the [employer] for Metabolic Bone Disease, a World  Health Organization [facility]) Collaborating Centre. ASSESSMENT: The probability  of a major osteoporotic fracture is 8.2% within the next ten years. The probability of a hip fracture is 1.7% within the next ten years. Electronically Signed   By: Harmon Pier M.D.   On: 05/09/2023 12:56    Review of Systems  Height 5\' 8"  (1.727 m), weight 116.1 kg. Physical Exam Vitals reviewed.  Constitutional:      Appearance: Normal appearance. She is obese.  HENT:     Head: Normocephalic and atraumatic.  Eyes:     Extraocular Movements: Extraocular movements intact.     Pupils: Pupils are equal, round, and reactive to light.  Cardiovascular:     Rate and Rhythm: Normal rate and regular rhythm.  Pulmonary:     Effort: Pulmonary effort is normal.     Breath sounds: Normal breath sounds.  Abdominal:     Palpations: Abdomen is soft.  Musculoskeletal:     Left hand: Tenderness present.     Cervical back: Normal range of motion and neck supple.  Skin:    Capillary Refill: Capillary refill takes more than 3 seconds.  Neurological:     Mental Status: She is alert.  Psychiatric:        Behavior: Behavior normal.   Examination of the left hand shows pain over the index finger A1 pulley with active triggering.  Assessment/Plan Trigger finger left index finger  The plan is to proceed to surgery as an outpatient for a left index finger A1 pulley release.  Having had this before on her other hand, the patient has been fully aware of the risk and benefits of the surgery and understands what the surgery involves.  Kathryne Hitch, MD 05/10/2023, 9:54 PM

## 2023-05-10 NOTE — Anesthesia Preprocedure Evaluation (Signed)
Anesthesia Evaluation  Patient identified by MRN, date of birth, ID band Patient awake    Reviewed: Allergy & Precautions, NPO status , Patient's Chart, lab work & pertinent test results  History of Anesthesia Complications Negative for: history of anesthetic complications  Airway Mallampati: II  TM Distance: >3 FB Neck ROM: Full    Dental  (+) Teeth Intact, Dental Advisory Given   Pulmonary sleep apnea , former smoker   Pulmonary exam normal breath sounds clear to auscultation       Cardiovascular hypertension, Pt. on medications and Pt. on home beta blockers + CAD and + CABG  Normal cardiovascular exam+ Valvular Problems/Murmurs  Rhythm:Regular Rate:Normal   '21 TTE - EF 60 to 65%. There is moderate left ventricular hypertrophy. Grade II diastolic dysfunction (pseudonormalization). Status post mitral valve repair. No significant stenosis or regurgitation. Mild aortic valve sclerosis     Neuro/Psych  PSYCHIATRIC DISORDERS  Depression    TIACVA, Residual Symptoms    GI/Hepatic Neg liver ROS,GERD  Medicated and Controlled,,  Endo/Other  diabetes, Type 2, Insulin Dependent, Oral Hypoglycemic Agents  Class 3 obesity Obesity   Renal/GU CRFRenal disease     Musculoskeletal  (+) Arthritis ,    Abdominal  (+) + obese  Peds  Hematology  (+) Blood dyscrasia, anemia   Anesthesia Other Findings   Reproductive/Obstetrics  Breast cancer                               Anesthesia Physical Anesthesia Plan  ASA: 3  Anesthesia Plan: MAC   Post-op Pain Management: Tylenol PO (pre-op)* and Minimal or no pain anticipated   Induction: Intravenous  PONV Risk Score and Plan: 2 and Propofol infusion, Treatment may vary due to age or medical condition and TIVA  Airway Management Planned: Natural Airway and Simple Face Mask  Additional Equipment: None  Intra-op Plan:   Post-operative Plan:    Informed Consent: I have reviewed the patients History and Physical, chart, labs and discussed the procedure including the risks, benefits and alternatives for the proposed anesthesia with the patient or authorized representative who has indicated his/her understanding and acceptance.     Dental advisory given  Plan Discussed with: CRNA  Anesthesia Plan Comments:          Anesthesia Quick Evaluation

## 2023-05-10 NOTE — Telephone Encounter (Signed)
Called pt per Np to inform her on bone density results. Bone density shows osteopenia, thinning of the bones. Pt was recommended to take calcium with vitamin D and also do brisk exercising. She recommended repeating bone density testing in 2 years. Pt verbalized and understanding.

## 2023-05-11 ENCOUNTER — Encounter (HOSPITAL_BASED_OUTPATIENT_CLINIC_OR_DEPARTMENT_OTHER)
Admission: RE | Disposition: A | Discharge status: Home / Self Care | Payer: Self-pay | Source: Home / Self Care | Attending: Orthopaedic Surgery

## 2023-05-11 ENCOUNTER — Other Ambulatory Visit: Payer: Self-pay

## 2023-05-11 ENCOUNTER — Ambulatory Visit (HOSPITAL_BASED_OUTPATIENT_CLINIC_OR_DEPARTMENT_OTHER): Payer: Self-pay | Admitting: Anesthesiology

## 2023-05-11 ENCOUNTER — Encounter (HOSPITAL_BASED_OUTPATIENT_CLINIC_OR_DEPARTMENT_OTHER): Payer: Self-pay | Admitting: Orthopaedic Surgery

## 2023-05-11 ENCOUNTER — Ambulatory Visit (HOSPITAL_BASED_OUTPATIENT_CLINIC_OR_DEPARTMENT_OTHER)
Admission: RE | Admit: 2023-05-11 | Discharge: 2023-05-11 | Disposition: A | Discharge status: Home / Self Care | Payer: Medicare PPO | Attending: Orthopaedic Surgery | Admitting: Orthopaedic Surgery

## 2023-05-11 DIAGNOSIS — K219 Gastro-esophageal reflux disease without esophagitis: Secondary | ICD-10-CM | POA: Diagnosis not present

## 2023-05-11 DIAGNOSIS — Z853 Personal history of malignant neoplasm of breast: Secondary | ICD-10-CM | POA: Insufficient documentation

## 2023-05-11 DIAGNOSIS — Z87891 Personal history of nicotine dependence: Secondary | ICD-10-CM | POA: Insufficient documentation

## 2023-05-11 DIAGNOSIS — Z951 Presence of aortocoronary bypass graft: Secondary | ICD-10-CM | POA: Insufficient documentation

## 2023-05-11 DIAGNOSIS — I10 Essential (primary) hypertension: Secondary | ICD-10-CM | POA: Diagnosis not present

## 2023-05-11 DIAGNOSIS — M65322 Trigger finger, left index finger: Secondary | ICD-10-CM | POA: Diagnosis not present

## 2023-05-11 DIAGNOSIS — I2510 Atherosclerotic heart disease of native coronary artery without angina pectoris: Secondary | ICD-10-CM | POA: Insufficient documentation

## 2023-05-11 DIAGNOSIS — G4730 Sleep apnea, unspecified: Secondary | ICD-10-CM | POA: Insufficient documentation

## 2023-05-11 DIAGNOSIS — Z01818 Encounter for other preprocedural examination: Secondary | ICD-10-CM

## 2023-05-11 DIAGNOSIS — E119 Type 2 diabetes mellitus without complications: Secondary | ICD-10-CM | POA: Diagnosis not present

## 2023-05-11 HISTORY — PX: TRIGGER FINGER RELEASE: SHX641

## 2023-05-11 LAB — GLUCOSE, CAPILLARY
Glucose-Capillary: 160 mg/dL — ABNORMAL HIGH (ref 70–99)
Glucose-Capillary: 173 mg/dL — ABNORMAL HIGH (ref 70–99)

## 2023-05-11 SURGERY — RELEASE, A1 PULLEY, FOR TRIGGER FINGER
Anesthesia: Monitor Anesthesia Care | Site: Hand | Laterality: Left

## 2023-05-11 MED ORDER — ACETAMINOPHEN 500 MG PO TABS
ORAL_TABLET | ORAL | Status: AC
  Filled 2023-05-11: qty 2

## 2023-05-11 MED ORDER — OXYCODONE HCL 5 MG/5ML PO SOLN: 5.0000 mg | Freq: Once | ORAL | Status: DC | PRN

## 2023-05-11 MED ORDER — BUPIVACAINE HCL (PF) 0.25 % IJ SOLN
INTRAMUSCULAR | Status: DC | PRN
  Administered 2023-05-11: 4 mL

## 2023-05-11 MED ORDER — LIDOCAINE HCL (PF) 1 % IJ SOLN
INTRAMUSCULAR | Status: DC | PRN
  Administered 2023-05-11: 10 mL

## 2023-05-11 MED ORDER — HYDROCODONE-ACETAMINOPHEN 5-325 MG PO TABS: 1.0000 | ORAL_TABLET | Freq: Four times a day (QID) | ORAL | 0 refills | Status: DC | PRN

## 2023-05-11 MED ORDER — CEFAZOLIN SODIUM-DEXTROSE 2-4 GM/100ML-% IV SOLN
INTRAVENOUS | Status: AC
  Filled 2023-05-11: qty 100

## 2023-05-11 MED ORDER — PHENYLEPHRINE 80 MCG/ML (10ML) SYRINGE FOR IV PUSH (FOR BLOOD PRESSURE SUPPORT)
PREFILLED_SYRINGE | INTRAVENOUS | Status: AC
  Filled 2023-05-11: qty 10

## 2023-05-11 MED ORDER — PROPOFOL 10 MG/ML IV BOLUS
INTRAVENOUS | Status: AC
  Filled 2023-05-11: qty 20

## 2023-05-11 MED ORDER — PHENYLEPHRINE HCL (PRESSORS) 10 MG/ML IV SOLN
INTRAVENOUS | Status: DC | PRN
  Administered 2023-05-11: 160 ug via INTRAVENOUS

## 2023-05-11 MED ORDER — FENTANYL CITRATE (PF) 100 MCG/2ML IJ SOLN: 25.0000 ug | INTRAMUSCULAR | Status: DC | PRN

## 2023-05-11 MED ORDER — MIDAZOLAM HCL 2 MG/2ML IJ SOLN
INTRAMUSCULAR | Status: AC
  Filled 2023-05-11: qty 2

## 2023-05-11 MED ORDER — FENTANYL CITRATE (PF) 100 MCG/2ML IJ SOLN
INTRAMUSCULAR | Status: DC | PRN
  Administered 2023-05-11: 50 ug via INTRAVENOUS

## 2023-05-11 MED ORDER — OXYCODONE HCL 5 MG PO TABS: 5.0000 mg | ORAL_TABLET | Freq: Once | ORAL | Status: DC | PRN

## 2023-05-11 MED ORDER — ONDANSETRON HCL 4 MG/2ML IJ SOLN
INTRAMUSCULAR | Status: DC | PRN
  Administered 2023-05-11: 4 mg via INTRAVENOUS

## 2023-05-11 MED ORDER — CEFAZOLIN SODIUM-DEXTROSE 2-4 GM/100ML-% IV SOLN
2.0000 g | INTRAVENOUS | Status: AC
  Administered 2023-05-11: 2 g via INTRAVENOUS

## 2023-05-11 MED ORDER — ONDANSETRON HCL 4 MG/2ML IJ SOLN
INTRAMUSCULAR | Status: AC
  Filled 2023-05-11: qty 2

## 2023-05-11 MED ORDER — DROPERIDOL 2.5 MG/ML IJ SOLN: 0.6250 mg | Freq: Once | INTRAMUSCULAR | Status: DC | PRN

## 2023-05-11 MED ORDER — ACETAMINOPHEN 500 MG PO TABS
1000.0000 mg | ORAL_TABLET | Freq: Once | ORAL | Status: AC
Start: 2023-05-11 — End: 2023-05-11
  Administered 2023-05-11: 1000 mg via ORAL

## 2023-05-11 MED ORDER — FENTANYL CITRATE (PF) 100 MCG/2ML IJ SOLN
INTRAMUSCULAR | Status: AC
  Filled 2023-05-11: qty 2

## 2023-05-11 MED ORDER — PROPOFOL 500 MG/50ML IV EMUL
INTRAVENOUS | Status: DC | PRN
  Administered 2023-05-11: 50 ug/kg/min via INTRAVENOUS

## 2023-05-11 MED ORDER — LIDOCAINE HCL (PF) 1 % IJ SOLN
INTRAMUSCULAR | Status: AC
Start: 2023-05-11 — End: ?
  Filled 2023-05-11: qty 30

## 2023-05-11 MED ORDER — BUPIVACAINE HCL (PF) 0.25 % IJ SOLN
INTRAMUSCULAR | Status: AC
  Filled 2023-05-11: qty 30

## 2023-05-11 SURGICAL SUPPLY — 35 items
BLADE SURG 15 STRL LF DISP TIS (BLADE) ×2 IMPLANT
BLADE SURG 15 STRL SS (BLADE) ×2
BNDG COHESIVE 1X5 TAN STRL LF (GAUZE/BANDAGES/DRESSINGS) ×1 IMPLANT
BNDG ELASTIC 3INX 5YD STR LF (GAUZE/BANDAGES/DRESSINGS) IMPLANT
BNDG ESMARK 4X9 LF (GAUZE/BANDAGES/DRESSINGS) IMPLANT
CORD BIPOLAR FORCEPS 12FT (ELECTRODE) ×1 IMPLANT
COVER BACK TABLE 60X90IN (DRAPES) ×1 IMPLANT
COVER MAYO STAND STRL (DRAPES) ×1 IMPLANT
CUFF TOURN SGL QUICK 18X4 (TOURNIQUET CUFF) IMPLANT
CUFF TOURN SGL QUICK 24 (TOURNIQUET CUFF)
CUFF TRNQT CYL 24X4X16.5-23 (TOURNIQUET CUFF) IMPLANT
DRAPE EXTREMITY T 121X128X90 (DISPOSABLE) ×1 IMPLANT
DRAPE SURG 17X23 STRL (DRAPES) ×1 IMPLANT
DURAPREP 26ML APPLICATOR (WOUND CARE) ×1 IMPLANT
GAUZE SPONGE 4X4 12PLY STRL (GAUZE/BANDAGES/DRESSINGS) ×1 IMPLANT
GAUZE STRETCH 2X75IN STRL (MISCELLANEOUS) ×1 IMPLANT
GAUZE XEROFORM 1X8 LF (GAUZE/BANDAGES/DRESSINGS) ×1 IMPLANT
GLOVE BIO SURGEON STRL SZ7.5 (GLOVE) ×2 IMPLANT
GLOVE BIOGEL PI IND STRL 8 (GLOVE) ×2 IMPLANT
GOWN STRL REUS W/ TWL LRG LVL3 (GOWN DISPOSABLE) ×1 IMPLANT
GOWN STRL REUS W/TWL LRG LVL3 (GOWN DISPOSABLE) ×1
GOWN STRL REUS W/TWL XL LVL3 (GOWN DISPOSABLE) ×2 IMPLANT
NDL HYPO 25X1 1.5 SAFETY (NEEDLE) IMPLANT
NEEDLE HYPO 25X1 1.5 SAFETY (NEEDLE)
NS IRRIG 1000ML POUR BTL (IV SOLUTION) ×1 IMPLANT
PACK BASIN DAY SURGERY FS (CUSTOM PROCEDURE TRAY) ×1 IMPLANT
PAD CAST 3X4 CTTN HI CHSV (CAST SUPPLIES) ×1 IMPLANT
PADDING CAST COTTON 3X4 STRL (CAST SUPPLIES) ×1
SLEEVE SCD COMPRESS KNEE MED (STOCKING) IMPLANT
STOCKINETTE 4X48 STRL (DRAPES) ×1 IMPLANT
SUT ETHILON 3 0 PS 1 (SUTURE) ×1 IMPLANT
SYR BULB EAR ULCER 3OZ GRN STR (SYRINGE) ×1 IMPLANT
SYR CONTROL 10ML LL (SYRINGE) IMPLANT
TOWEL GREEN STERILE FF (TOWEL DISPOSABLE) ×1 IMPLANT
UNDERPAD 30X36 HEAVY ABSORB (UNDERPADS AND DIAPERS) ×1 IMPLANT

## 2023-05-11 NOTE — Anesthesia Postprocedure Evaluation (Signed)
Anesthesia Post Note  Patient: [name]  Procedure(s) Performed: LEFT INDEX FINGER RELEASE TRIGGER FINGER (Left: Hand)     Patient location during evaluation: PACU Anesthesia Type: MAC Level of consciousness: awake and alert Pain management: pain level controlled Vital Signs Assessment: post-procedure vital signs reviewed and stable Respiratory status: spontaneous breathing Cardiovascular status: stable Anesthetic complications: no   No notable events documented.  Last Vitals:  Vitals:   05/11/23 0821 05/11/23 0832  BP: 135/77 138/78  Pulse: 60 66  Resp: 18 14  Temp:  (!) 36.2 C  SpO2: 98% 98%    Last Pain:  Vitals:   05/11/23 0832  TempSrc: Temporal  PainSc: 0-No pain                 Lewie Loron

## 2023-05-11 NOTE — Anesthesia Procedure Notes (Signed)
Procedure Name: MAC Date/Time: 05/11/2023 7:35 AM  Performed by: Cleda Clarks, CRNAPre-anesthesia Checklist: Patient identified, Emergency Drugs available, Suction available, Patient being monitored and Timeout performed Patient Re-evaluated:Patient Re-evaluated prior to induction Oxygen Delivery Method: Simple face mask Placement Confirmation: positive ETCO2

## 2023-05-11 NOTE — Interval H&P Note (Signed)
History and Physical Interval Note: The patient understands that she is here today for a left index finger A1 pulley release to treat her trigger finger.  There has been no acute or interval change in her medical status.  The risks and benefits of surgery been discussed in detail and informed consent has been obtained.  The left index finger has been marked.  05/11/2023 7:17 AM  [name]  has presented today for surgery, with the diagnosis of left index trigger finger.  The various methods of treatment have been discussed with the patient and family. After consideration of risks, benefits and other options for treatment, the patient has consented to  Procedure(s): LEFT INDEX FINGER RELEASE TRIGGER FINGER (Left) as a surgical intervention.  The patient's history has been reviewed, patient examined, no change in status, stable for surgery.  I have reviewed the patient's chart and labs.  Questions were answered to the patient's satisfaction.     Kathryne Hitch

## 2023-05-11 NOTE — Op Note (Signed)
Operative Note  Date of operation: 05/11/2023 Preoperative diagnosis: Left index finger trigger finger Postoperative diagnosis: Same  Procedure: Left index finger A1 pulley release  Surgeon: Vanita Panda. Magnus Ivan, MD Assistant: Rexene Edison, PA-C  Anesthesia: #1 mask ventilation with IV sedation, #2 local Tourniquet time: 11 minutes EBL: Minimal Complications: None  Indications: The patient is a 72 year old female with known triggering of her left index finger.  It is continuing to actively trigger and is failed conservative treatment with steroid injections in time as well as anti-inflammatories.  She wishes to have an A1 pulley release.  She has had this on other areas of both hands before and understands fully the risks and benefits of the surgery and does wish to proceed given the continued triggering of her left index finger.  It is actually even triggering right now.  Procedure description: After informed consent was obtained and the appropriate left hand and left index finger were marked, the patient was brought to the operating room and placed supine on the operating table with the left hand in a arm table.  A nonsterile tourniquet placed around her upper left arm and the left hand and wrist as well as forearm were prepped and draped with DuraPrep and sterile drapes.  A timeout was called and she was then about prescription patient correct left hand.  Mask ventilation and IV sedation was given by anesthesia and the hand was wrapped out with an Esmarch.  The tourniquet was plated 250 mm of pressure.  We then anesthetized over the A1 pulley of the left index finger with 1% plain lidocaine.  Once the anesthesia of the lidocaine was in effect we made an incision over the A1 pulley and dissected down to the A1 pulley.  We protected the neurovascular structures and then released the A1 pulley from proximal to distal.  We were actually able to have her flex and extend her finger after this and  there was no active triggering.  We then irrigated the soft tissue with normal saline solution.  The skin was closed with 3-0 nylon suture.  We then placed plain Marcaine around the incision.  Well-padded sterile dressings applied.  The tourniquet was let down and her fingers pinked nicely.  She was taken to the recovery in stable condition.  Postoperatively we will give her instructions for wound care and follow-up.

## 2023-05-11 NOTE — Transfer of Care (Signed)
Immediate Anesthesia Transfer of Care Note  Patient: [name]  Procedure(s) Performed: LEFT INDEX FINGER RELEASE TRIGGER FINGER (Left: Hand)  Patient Location: PACU  Anesthesia Type:MAC  Level of Consciousness: awake, alert , and oriented  Airway & Oxygen Therapy: Patient Spontanous Breathing and Patient connected to face mask oxygen  Post-op Assessment: Report given to RN and Post -op Vital signs reviewed and stable  Post vital signs: Reviewed and stable  Last Vitals:  Vitals Value Taken Time  BP 149/133 05/11/23 0802  Temp    Pulse 62 05/11/23 0804  Resp 21 05/11/23 0804  SpO2 93 % 05/11/23 0804  Vitals shown include unfiled device data.  Last Pain:  Vitals:   05/11/23 0634  TempSrc: Temporal  PainSc: 0-No pain         Complications: No notable events documented.

## 2023-05-11 NOTE — Discharge Instructions (Addendum)
No Tylenol until after 12:45pm today if needed  Post Anesthesia Home Care Instructions  Activity: Get plenty of rest for the remainder of the day. A responsible individual must stay with you for 24 hours following the procedure.  For the next 24 hours, DO NOT: -Drive a car -Advertising copywriter -Drink alcoholic beverages -Take any medication unless instructed by your physician -Make any legal decisions or sign important papers.  Meals: Start with liquid foods such as gelatin or soup. Progress to regular foods as tolerated. Avoid greasy, spicy, heavy foods. If nausea and/or vomiting occur, drink only clear liquids until the nausea and/or vomiting subsides. Call your physician if vomiting continues.  Special Instructions/Symptoms: Your throat may feel dry or sore from the anesthesia or the breathing tube placed in your throat during surgery. If this causes discomfort, gargle with warm salt water. The discomfort should disappear within 24 hours.  If you had a scopolamine patch placed behind your ear for the management of post- operative nausea and/or vomiting:  1. The medication in the patch is effective for 72 hours, after which it should be removed.  Wrap patch in a tissue and discard in the trash. Wash hands thoroughly with soap and water. 2. You may remove the patch earlier than 72 hours if you experience unpleasant side effects which may include dry mouth, dizziness or visual disturbances. 3. Avoid touching the patch. Wash your hands with soap and water after contact with the patch.

## 2023-05-12 ENCOUNTER — Encounter (HOSPITAL_BASED_OUTPATIENT_CLINIC_OR_DEPARTMENT_OTHER): Payer: Self-pay | Admitting: Orthopaedic Surgery

## 2023-05-15 DIAGNOSIS — N1832 Chronic kidney disease, stage 3b: Secondary | ICD-10-CM | POA: Diagnosis not present

## 2023-05-17 ENCOUNTER — Other Ambulatory Visit: Payer: Self-pay | Admitting: Orthopaedic Surgery

## 2023-05-17 ENCOUNTER — Other Ambulatory Visit: Payer: Self-pay | Admitting: Internal Medicine

## 2023-05-17 ENCOUNTER — Telehealth: Payer: Self-pay | Admitting: Orthopaedic Surgery

## 2023-05-17 DIAGNOSIS — E782 Mixed hyperlipidemia: Secondary | ICD-10-CM

## 2023-05-17 MED ORDER — HYDROCODONE-ACETAMINOPHEN 5-325 MG PO TABS: 1.0000 | ORAL_TABLET | Freq: Three times a day (TID) | ORAL | 0 refills | Status: DC | PRN

## 2023-05-17 NOTE — Telephone Encounter (Signed)
Patient called would like a refill on hydrocodone

## 2023-05-24 ENCOUNTER — Encounter: Payer: Medicare PPO | Admitting: Neurology

## 2023-05-24 ENCOUNTER — Ambulatory Visit: Payer: Medicare PPO | Admitting: Orthopaedic Surgery

## 2023-05-24 ENCOUNTER — Encounter: Payer: Self-pay | Admitting: Orthopaedic Surgery

## 2023-05-24 DIAGNOSIS — Z9889 Other specified postprocedural states: Secondary | ICD-10-CM

## 2023-05-24 NOTE — Progress Notes (Signed)
The patient is following up today after having a left index finger trigger finger release of the A1 pulley.  She is doing well.  On exam I did remove the 3 sutures and placed Steri-Strips over the incision.  It does have a lot of issues I told her she can place Vaseline on the wound daily with a Band-Aid and it should heal with time.  She has healed other wounds on her hands before from triggering.  There is now no active triggering in her fingers neurovascular intact.  Follow-up can be as needed.  If there are any issues at all she knows to let us know having been a patient of ours before.

## 2023-05-30 DIAGNOSIS — N1832 Chronic kidney disease, stage 3b: Secondary | ICD-10-CM | POA: Diagnosis not present

## 2023-05-31 NOTE — Patient Instructions (Signed)
DUE TO COVID-19 ONLY TWO VISITORS  (aged 74 and older)  ARE ALLOWED TO COME WITH YOU AND STAY IN THE WAITING ROOM ONLY DURING PRE OP AND PROCEDURE.   **NO VISITORS ARE ALLOWED IN THE SHORT STAY AREA OR RECOVERY ROOM!!**  IF YOU WILL BE ADMITTED INTO THE HOSPITAL YOU ARE ALLOWED ONLY FOUR SUPPORT PEOPLE DURING VISITATION HOURS ONLY (7 AM -8PM)   The support person(s) must pass our screening, gel in and out, and wear a mask at all times, including in the patient's room. Patients must also wear a mask when staff or their support person are in the room. Visitors GUEST BADGE MUST BE WORN VISIBLY  One adult visitor may remain with you overnight and MUST be in the room by 8 P.M.     Your procedure is scheduled on: 06/08/23   Report to [facility] Main Entrance    Report to admitting at : 5:15 AM   Call this number if you have problems the morning of surgery [phone]   Clear liquids starting the day before surgery until : 4:30 AM DAY OF SURGERY. Drink plenty clear liquids the day before surgery.  Water Black Coffee (sugar ok, NO MILK/CREAM OR CREAMERS)  Tea (sugar ok, NO MILK/CREAM OR CREAMERS) regular and decaf                             Plain Jell-O (NO RED)                                           Fruit ices (not with fruit pulp, NO RED)                                     Popsicles (NO RED)                                                                  Juice: apple, WHITE grape, WHITE cranberry Sports drinks like Gatorade (NO RED)              Drink 2 Ensure/G2 drinks AT 10:00 PM the night before surgery.      The day of surgery:  Drink ONE (1) Pre-Surgery Clear G2 at : 4:30 AM the morning of surgery. Drink in one sitting. Do not sip.  This drink was given to you during your hospital  pre-op appointment visit. Nothing else to drink after completing the  Pre-Surgery Clear Ensure or G2.          If you have questions, please contact your surgeon's office.  FOLLOW  BOWEL PREP AND ANY ADDITIONAL PRE OP INSTRUCTIONS YOU RECEIVED FROM YOUR SURGEON'S OFFICE!!!   Oral Hygiene is also important to reduce your risk of infection.                                    Remember - BRUSH YOUR TEETH THE MORNING OF SURGERY WITH YOUR REGULAR TOOTHPASTE  DENTURES WILL  BE REMOVED PRIOR TO SURGERY PLEASE DO NOT APPLY "Poly grip" OR ADHESIVES!!!   Do NOT smoke after Midnight   Take these medicines the morning of surgery with A SIP OF WATER: gabapentin,bupropion,duloxetine,apresoline,letrozole,carvedilol,amlodipine,pantoprazole.Use inhalers as usual.  How to Manage Your Diabetes Before and After Surgery  Why is it important to control my blood sugar before and after surgery? Improving blood sugar levels before and after surgery helps healing and can limit problems. A way of improving blood sugar control is eating a healthy diet by:  Eating less sugar and carbohydrates  Increasing activity/exercise  Talking with your doctor about reaching your blood sugar goals High blood sugars (greater than 180 mg/dL) can raise your risk of infections and slow your recovery, so you will need to focus on controlling your diabetes during the weeks before surgery. Make sure that the doctor who takes care of your diabetes knows about your planned surgery including the date and location.  How do I manage my blood sugar before surgery? Check your blood sugar at least 4 times a day, starting 2 days before surgery, to make sure that the level is not too high or low. Check your blood sugar the morning of your surgery when you wake up and every 2 hours until you get to the Short Stay unit. If your blood sugar is less than 70 mg/dL, you will need to treat for low blood sugar: Do not take insulin. Treat a low blood sugar (less than 70 mg/dL) with  cup of clear juice (cranberry or apple), 4 glucose tablets, OR glucose gel. Recheck blood sugar in 15 minutes after treatment (to make sure it is  greater than 70 mg/dL). If your blood sugar is not greater than 70 mg/dL on recheck, call [phone] for further instructions. Report your blood sugar to the short stay nurse when you get to Short Stay.  If you are admitted to the hospital after surgery: Your blood sugar will be checked by the staff and you will probably be given insulin after surgery (instead of oral diabetes medicines) to make sure you have good blood sugar levels. The goal for blood sugar control after surgery is 80-180 mg/dL.  WHAT DO I DO ABOUT MY DIABETES MEDICATION?  HOLD farxiga after: 06/04/23  THE NIGHT BEFORE SURGERY, take ONLY half of the lantus insulin dose..      THE MORNING OF SURGERY, DO NOT TAKE ANY ORAL DIABETIC MEDICATIONS DAY OF YOUR SURGERY  Bring CPAP mask and tubing day of surgery.                              You may not have any metal on your body including hair pins, jewelry, and body piercing             Do not wear make-up, lotions, powders, perfumes/cologne, or deodorant  Do not wear nail polish including gel and S&S, artificial/acrylic nails, or any other type of covering on natural nails including finger and toenails. If you have artificial nails, gel coating, etc. that needs to be removed by a nail salon please have this removed prior to surgery or surgery may need to be canceled/ delayed if the surgeon/ anesthesia feels like they are unable to be safely monitored.   Do not shave  48 hours prior to surgery.    Do not bring valuables to the hospital. [facility] IS NOT  RESPONSIBLE   FOR VALUABLES.   Contacts, glasses, or bridgework may not be worn into surgery.   Bring small overnight bag day of surgery.   DO NOT BRING YOUR HOME MEDICATIONS TO THE HOSPITAL. PHARMACY WILL DISPENSE MEDICATIONS LISTED ON YOUR MEDICATION LIST TO YOU DURING YOUR ADMISSION IN THE HOSPITAL!    Patients discharged on the day of surgery will not be allowed to drive home.  Someone NEEDS to stay  with you for the first 24 hours after anesthesia.   Special Instructions: Bring a copy of your healthcare power of attorney and living will documents         the day of surgery if you haven't scanned them before.              Please read over the following fact sheets you were given: IF YOU HAVE QUESTIONS ABOUT YOUR PRE-OP INSTRUCTIONS PLEASE CALL [phone]    [facility] - Preparing for Surgery Before surgery, you can play an important role.  Because skin is not sterile, your skin needs to be as free of germs as possible.  You can reduce the number of germs on your skin by washing with CHG (chlorahexidine gluconate) soap before surgery.  CHG is an antiseptic cleaner which kills germs and bonds with the skin to continue killing germs even after washing. Please DO NOT use if you have an allergy to CHG or antibacterial soaps.  If your skin becomes reddened/irritated stop using the CHG and inform your nurse when you arrive at Short Stay. Do not shave (including legs and underarms) for at least 48 hours prior to the first CHG shower.  You may shave your face/neck. Please follow these instructions carefully:  1.  Shower with CHG Soap the night before surgery and the  morning of Surgery.  2.  If you choose to wash your hair, wash your hair first as usual with your  normal  shampoo.  3.  After you shampoo, rinse your hair and body thoroughly to remove the  shampoo.                           4.  Use CHG as you would any other liquid soap.  You can apply chg directly  to the skin and wash                       Gently with a scrungie or clean washcloth.  5.  Apply the CHG Soap to your body ONLY FROM THE NECK DOWN.   Do not use on face/ open                           Wound or open sores. Avoid contact with eyes, ears mouth and genitals (private parts).                       Wash face,  Genitals (private parts) with your normal soap.             6.  Wash thoroughly, paying special attention to the area  where your surgery  will be performed.  7.  Thoroughly rinse your body with warm water from the neck down.  8.  DO NOT shower/wash with your normal soap after using and rinsing off  the CHG Soap.  9.  Pat yourself dry with a clean towel.            10.  Wear clean pajamas.            11.  Place clean sheets on your bed the night of your first shower and do not  sleep with pets. Day of Surgery : Do not apply any lotions/deodorants the morning of surgery.  Please wear clean clothes to the hospital/surgery center.  FAILURE TO FOLLOW THESE INSTRUCTIONS MAY RESULT IN THE CANCELLATION OF YOUR SURGERY PATIENT SIGNATURE_________________________________  NURSE SIGNATURE__________________________________  ________________________________________________________________________  Rogelia Mire  An incentive spirometer is a tool that can help keep your lungs clear and active. This tool measures how well you are filling your lungs with each breath. Taking long deep breaths may help reverse or decrease the chance of developing breathing (pulmonary) problems (especially infection) following: A long period of time when you are unable to move or be active. BEFORE THE PROCEDURE  If the spirometer includes an indicator to show your best effort, your nurse or respiratory therapist will set it to a desired goal. If possible, sit up straight or lean slightly forward. Try not to slouch. Hold the incentive spirometer in an upright position. INSTRUCTIONS FOR USE  Sit on the edge of your bed if possible, or sit up as far as you can in bed or on a chair. Hold the incentive spirometer in an upright position. Breathe out normally. Place the mouthpiece in your mouth and seal your lips tightly around it. Breathe in slowly and as deeply as possible, raising the piston or the ball toward the top of the column. Hold your breath for 3-5 seconds or for as long as possible. Allow the piston or ball to  fall to the bottom of the column. Remove the mouthpiece from your mouth and breathe out normally. Rest for a few seconds and repeat Steps 1 through 7 at least 10 times every 1-2 hours when you are awake. Take your time and take a few normal breaths between deep breaths. The spirometer may include an indicator to show your best effort. Use the indicator as a goal to work toward during each repetition. After each set of 10 deep breaths, practice coughing to be sure your lungs are clear. If you have an incision (the cut made at the time of surgery), support your incision when coughing by placing a pillow or rolled up towels firmly against it. Once you are able to get out of bed, walk around indoors and cough well. You may stop using the incentive spirometer when instructed by your caregiver.  RISKS AND COMPLICATIONS Take your time so you do not get dizzy or light-headed. If you are in pain, you may need to take or ask for pain medication before doing incentive spirometry. It is harder to take a deep breath if you are having pain. AFTER USE Rest and breathe slowly and easily. It can be helpful to keep track of a log of your progress. Your caregiver can provide you with a simple table to help with this. If you are using the spirometer at home, follow these instructions: SEEK MEDICAL CARE IF:  You are having difficultly using the spirometer. You have trouble using the spirometer as often as instructed. Your pain medication is not giving enough relief while using the spirometer. You develop fever of 100.5 F (38.1 C) or higher. SEEK IMMEDIATE MEDICAL CARE IF:  You cough up bloody sputum that had not been  present before. You develop fever of 102 F (38.9 C) or greater. You develop worsening pain at or near the incision site. MAKE SURE YOU:  Understand these instructions. Will watch your condition. Will get help right away if you are not doing well or get worse. Document Released: 10/24/2006  Document Revised: 09/05/2011 Document Reviewed: 12/25/2006 [employer] Patient Information 2014 [employer], [place].   ________________________________________________________________________ WHAT IS A BLOOD TRANSFUSION? Blood Transfusion Information  A transfusion is the replacement of blood or some of its parts. Blood is made up of multiple cells which provide different functions. Red blood cells carry oxygen and are used for blood loss replacement. White blood cells fight against infection. Platelets control bleeding. Plasma helps clot blood. Other blood products are available for specialized needs, such as hemophilia or other clotting disorders. BEFORE THE TRANSFUSION  Who gives blood for transfusions?  Healthy volunteers who are fully evaluated to make sure their blood is safe. This is blood bank blood. Transfusion therapy is the safest it has ever been in the practice of medicine. Before blood is taken from a donor, a complete history is taken to make sure that person has no history of diseases nor engages in risky social behavior (examples are intravenous drug use or sexual activity with multiple partners). The donor's travel history is screened to minimize risk of transmitting infections, such as malaria. The donated blood is tested for signs of infectious diseases, such as HIV and hepatitis. The blood is then tested to be sure it is compatible with you in order to minimize the chance of a transfusion reaction. If you or a relative donates blood, this is often done in anticipation of surgery and is not appropriate for emergency situations. It takes many days to process the donated blood. RISKS AND COMPLICATIONS Although transfusion therapy is very safe and saves many lives, the main dangers of transfusion include:  Getting an infectious disease. Developing a transfusion reaction. This is an allergic reaction to something in the blood you were given. Every precaution is taken to prevent  this. The decision to have a blood transfusion has been considered carefully by your caregiver before blood is given. Blood is not given unless the benefits outweigh the risks. AFTER THE TRANSFUSION Right after receiving a blood transfusion, you will usually feel much better and more energetic. This is especially true if your red blood cells have gotten low (anemic). The transfusion raises the level of the red blood cells which carry oxygen, and this usually causes an energy increase. The nurse administering the transfusion will monitor you carefully for complications. HOME CARE INSTRUCTIONS  No special instructions are needed after a transfusion. You may find your energy is better. Speak with your caregiver about any limitations on activity for underlying diseases you may have. SEEK MEDICAL CARE IF:  Your condition is not improving after your transfusion. You develop redness or irritation at the intravenous (IV) site. SEEK IMMEDIATE MEDICAL CARE IF:  Any of the following symptoms occur over the next 12 hours: Shaking chills. You have a temperature by mouth above 102 F (38.9 C), not controlled by medicine. Chest, back, or muscle pain. People around you feel you are not acting correctly or are confused. Shortness of breath or difficulty breathing. Dizziness and fainting. You get a rash or develop hives. You have a decrease in urine output. Your urine turns a dark color or changes to pink, red, or brown. Any of the following symptoms occur over the next 10 days: You have a  temperature by mouth above 102 F (38.9 C), not controlled by medicine. Shortness of breath. Weakness after normal activity. The white part of the eye turns yellow (jaundice). You have a decrease in the amount of urine or are urinating less often. Your urine turns a dark color or changes to pink, red, or brown. Document Released: 06/10/2000 Document Revised: 09/05/2011 Document Reviewed: 01/28/2008 [facility] Patient  Information 2014 [place], [place].  _______________________________________________________________________

## 2023-06-01 ENCOUNTER — Encounter (HOSPITAL_COMMUNITY): Payer: Self-pay

## 2023-06-01 ENCOUNTER — Encounter (HOSPITAL_COMMUNITY)
Admission: RE | Admit: 2023-06-01 | Discharge: 2023-06-01 | Disposition: A | Discharge status: Home / Self Care | Payer: Medicare PPO | Source: Ambulatory Visit | Attending: General Surgery | Admitting: General Surgery

## 2023-06-01 ENCOUNTER — Other Ambulatory Visit: Payer: Self-pay

## 2023-06-01 VITALS — BP 178/66 | HR 81 | Temp 98.4°F | Ht 68.0 in | Wt 257.0 lb

## 2023-06-01 DIAGNOSIS — E1142 Type 2 diabetes mellitus with diabetic polyneuropathy: Secondary | ICD-10-CM | POA: Insufficient documentation

## 2023-06-01 DIAGNOSIS — Z01812 Encounter for preprocedural laboratory examination: Secondary | ICD-10-CM | POA: Diagnosis not present

## 2023-06-01 DIAGNOSIS — Z01818 Encounter for other preprocedural examination: Secondary | ICD-10-CM

## 2023-06-01 DIAGNOSIS — I10 Essential (primary) hypertension: Secondary | ICD-10-CM | POA: Diagnosis not present

## 2023-06-01 LAB — CBC
HCT: 36 % (ref 36.0–46.0)
Hemoglobin: 11 g/dL — ABNORMAL LOW (ref 12.0–15.0)
MCH: 24.9 pg — ABNORMAL LOW (ref 26.0–34.0)
MCHC: 30.6 g/dL (ref 30.0–36.0)
MCV: 81.6 fL (ref 80.0–100.0)
Platelets: 230 10*3/uL (ref 150–400)
RBC: 4.41 MIL/uL (ref 3.87–5.11)
RDW: 16.6 % — ABNORMAL HIGH (ref 11.5–15.5)
WBC: 6.3 10*3/uL (ref 4.0–10.5)
nRBC: 0 % (ref 0.0–0.2)

## 2023-06-01 LAB — COMPREHENSIVE METABOLIC PANEL
ALT: 13 U/L (ref 0–44)
AST: 19 U/L (ref 15–41)
Albumin: 3.5 g/dL (ref 3.5–5.0)
Alkaline Phosphatase: 50 U/L (ref 38–126)
Anion gap: 8 (ref 5–15)
BUN: 29 mg/dL — ABNORMAL HIGH (ref 8–23)
CO2: 22 mmol/L (ref 22–32)
Calcium: 9 mg/dL (ref 8.9–10.3)
Chloride: 107 mmol/L (ref 98–111)
Creatinine, Ser: 1.51 mg/dL — ABNORMAL HIGH (ref 0.44–1.00)
GFR, Estimated: 36 mL/min — ABNORMAL LOW (ref >60)
Glucose, Bld: 181 mg/dL — ABNORMAL HIGH (ref 70–99)
Potassium: 4.6 mmol/L (ref 3.5–5.1)
Sodium: 137 mmol/L (ref 135–145)
Total Bilirubin: 0.4 mg/dL (ref <1.2)
Total Protein: 6.7 g/dL (ref 6.5–8.1)

## 2023-06-01 LAB — SURGICAL PCR SCREEN
MRSA, PCR: NEGATIVE
Staphylococcus aureus: NEGATIVE

## 2023-06-01 LAB — GLUCOSE, CAPILLARY: Glucose-Capillary: 211 mg/dL — ABNORMAL HIGH (ref 70–99)

## 2023-06-01 NOTE — Progress Notes (Signed)
For Anesthesia: PCP - Marcine Matar, MD  Cardiologist - Quintella Reichert, MD   Bowel Prep reminder:  Chest x-ray -  EKG -  Stress Test -  ECHO - 03/03/20 Cardiac Cath - 04/03/15 Pacemaker/ICD device last checked: Pacemaker orders received: Device Rep notified:  Spinal Cord Stimulator:N/A  Sleep Study - Yes CPAP - No  Fasting Blood Sugar - 120's Checks Blood Sugar __2___ times a day Date and result of last Hgb A1c-7.3: 03/14/23  Last dose of GLP1 agonist- N/A GLP1 instructions:    Last dose of SGLT-2 inhibitors- Farxiga SGLT-2 instructions: To hold it after: 06/04/23  Blood Thinner Instructions: Aspirin Instructions: Pt. Was instructed to check with MD. About instructions. Last Dose:  Activity level: Can go up a flight of stairs and activities of daily living without stopping and without chest pain and/or shortness of breath   Able to exercise without chest pain and/or shortness of breath  Anesthesia review:Hx: CAD,DIA,TIA,OSA(NO CPAP),Stroke,CKD III.   Patient denies shortness of breath, fever, cough and chest pain at PAT appointment   Patient verbalized understanding of instructions that were given to them at the PAT appointment. Patient was also instructed that they will need to review over the PAT instructions again at home before surgery.

## 2023-06-05 ENCOUNTER — Telehealth: Payer: Self-pay | Admitting: Cardiology

## 2023-06-05 NOTE — Telephone Encounter (Signed)
   Pre-operative Risk Assessment    Patient Name: [name]  DOB: [birth year] MRN: [id number]      Request for Surgical Clearance    Procedure:   Laparoscopic partial colectomy  Date of Surgery:  Clearance 06/08/23                                 Surgeon:  Dr. Earnest Bailey Surgeon's Group or Practice Name:  [facility] Phone number:  [phone]  Fax number:  [phone]   Type of Clearance Requested:   - Medical    Type of Anesthesia:  General    Additional requests/questions:   Caller Toniann Fail) stated patient will need medical clearance and takes baby aspirin.  Signed, Annetta Maw   06/05/2023, 9:25 AM

## 2023-06-05 NOTE — Telephone Encounter (Signed)
I will update the surgeon's office that myself and our scheduling team have reviewed schedules to see where we could get the pt in the office in time for her surgery, unfortunately per the scheduling team we have no openings. The scheduling team is going to keep an eye out for an appt. Unfortunately we did just receive this request today.

## 2023-06-05 NOTE — Telephone Encounter (Signed)
Our scheduling team has just informed me that they were able to schedule the pt tomorrow [name], PAC.   I will update all parties involved.

## 2023-06-05 NOTE — Telephone Encounter (Signed)
   Name: [name]  DOB: 1951/01/06  MRN: [id number]  Primary Cardiologist: Armanda Magic, MD Last OV: 09/10/2021  Chart reviewed as part of pre-operative protocol coverage. Because of [name]'s past medical history and time since last visit, she will require a follow-up in-office visit in order to better assess preoperative cardiovascular risk.  Pre-op covering staff: - Please schedule appointment and call patient to inform them. If patient already had an upcoming appointment within acceptable timeframe, please add "pre-op clearance" to the appointment notes so provider is aware. - Please contact requesting surgeon's office via preferred method (i.e, phone, fax) to inform them of need for appointment prior to surgery.  This message will also be routed to Dr.Turner for input on holding aspirin as requested below so that this information is available to the clearing provider at time of patient's appointment.   Rip Harbour, NP  06/05/2023, 12:42 PM

## 2023-06-05 NOTE — Telephone Encounter (Signed)
Patient scheduled for tomorrow at 2:20pm with Tereso Newcomer, PA

## 2023-06-05 NOTE — Telephone Encounter (Signed)
Dr. Mayford Knife,  Patient to undergo laparoscopic partial colectomy. She has history of CAD s/p CABG with SVG to D2 and SVG to PDA, fibroelastoma resection and MV repair in 2012. She is currently on aspirin 81 mg daily. Can you provide recommendations for holding aspirin for laparoscopic partial colectomy?  Thank you, Reather Littler, NP

## 2023-06-05 NOTE — Telephone Encounter (Signed)
I s/w Toniann Fail at surgeon's office as we just received the request today and the pt is going to need an appt in office. Procedure is planned for 06/08/23. I informed Toniann Fail not sure if we will be able to get the pt in but I will see what we can do. Toniann Fail states the anesthesiologists just requested the clearance. I did advise if cardiac pt, probably best to just fax over a clearance.   I have reviewed schedules at [facility] I cannot find an appt. I will reach out to our scheduling team to see if they may be able to find an appt I may be missing.

## 2023-06-06 ENCOUNTER — Encounter: Payer: Self-pay | Admitting: Physician Assistant

## 2023-06-06 ENCOUNTER — Ambulatory Visit: Payer: Medicare PPO | Attending: Physician Assistant | Admitting: Physician Assistant

## 2023-06-06 VITALS — BP 122/60 | HR 80 | Ht 68.0 in | Wt 260.0 lb

## 2023-06-06 DIAGNOSIS — E7800 Pure hypercholesterolemia, unspecified: Secondary | ICD-10-CM

## 2023-06-06 DIAGNOSIS — N1832 Chronic kidney disease, stage 3b: Secondary | ICD-10-CM | POA: Diagnosis not present

## 2023-06-06 DIAGNOSIS — Z01810 Encounter for preprocedural cardiovascular examination: Secondary | ICD-10-CM | POA: Diagnosis not present

## 2023-06-06 DIAGNOSIS — I2510 Atherosclerotic heart disease of native coronary artery without angina pectoris: Secondary | ICD-10-CM | POA: Diagnosis not present

## 2023-06-06 DIAGNOSIS — I10 Essential (primary) hypertension: Secondary | ICD-10-CM

## 2023-06-06 DIAGNOSIS — Z9889 Other specified postprocedural states: Secondary | ICD-10-CM | POA: Diagnosis not present

## 2023-06-06 DIAGNOSIS — K219 Gastro-esophageal reflux disease without esophagitis: Secondary | ICD-10-CM

## 2023-06-06 MED ORDER — PANTOPRAZOLE SODIUM 40 MG PO TBEC: 40.0000 mg | DELAYED_RELEASE_TABLET | Freq: Every day | ORAL | 0 refills | Status: DC

## 2023-06-06 NOTE — Assessment & Plan Note (Signed)
LDL goal <55.  LDL in May 2024 above goal at 87.  She is getting ready to undergo surgery in the next couple of days.  Will hold off on any medication changes at this time.  Follow-up in 4 to 6 months.  If LDL remains above 55, add Zetia.

## 2023-06-06 NOTE — Assessment & Plan Note (Signed)
Blood pressure well-controlled.  Continue amlodipine 10 mg daily, carvedilol 25 mg twice daily, hydralazine 100 mg twice daily, Lasix 20 mg daily, valsartan/HCTZ 160/25 mg daily.

## 2023-06-06 NOTE — Assessment & Plan Note (Signed)
Status post CABG in 2012 with vein graft to the D2 and vein graft to the PDA.  Cardiac catheterization 2016 with patent bypass grafts.  Nuclear stress test in September 2021 negative for ischemia.  She is doing well without anginal symptoms.  Continue ASA 81 mg daily, Lipitor 80 mg daily.  Follow-up 4 to 6 months.

## 2023-06-06 NOTE — Progress Notes (Signed)
Cardiology Office Note:    Date:  06/06/2023  ID:  [name], DOB 26-Aug-1950, MRN [id number] PCP: Marcine Matar, MD  [facility] Providers Cardiologist:  Armanda Magic, MD       Patient Profile:      Coronary artery disease Papillary fibroelastoma of the mitral valve S/p CABG (S-D2, S-PDA), resection of fibroelastoma, MV repair (Dr. Cornelius Moras) in 2012  LHC 04/03/2015: RCA mid 95, distal 100; RI ostial 95 (tiny branch); D2 100; LCx proximal 30; SVG-PDA, SVG-D2 patent TTE 03/03/2020: EF 60-65, no RWMA, moderate LVH, GR 2 DD, normal RVSF, normal PASP, RVSP 29.4, s/p MV repair (no stenosis or regurgitation), AV sclerosis, RAP 3 Myoview 03/03/2020: No ischemia or infarction, EF 55, low risk Hx of CVA Hypertension  Hyperlipidemia  Diabetes mellitus  Chronic kidney disease  R Breast CA  Bronchitis          History of Present Illness:  Discussed the use of AI scribe software for clinical note transcription with the patient, who gave verbal consent to proceed.  [name] is a 72 y.o. female who returns for surgical clearance. She was last seen by Dr. Mayford Knife in 04/2021. She needs lap partial colectomy for colon mass 06/08/23 with Dr. Earnest Bailey under gen anesthesia. She is here alone. She reports no chest pain, shortness of breath, or syncope. She experiences occasional leg swelling, particularly after prolonged standing. She is able to lay flat without difficulty and denies smoking. She is under the care of a nephrologist, Dr. Valentino Nose, for chronic kidney disease. Despite having back problems and using a cane, she is able to climb 15 steps multiple times a day in her condo and walk a block or two with intermittent rest. She is also able to perform household chores such as vacuuming, cooking, and folding clothes. She notes she has a mild form of COPD for which she uses an inhaler as needed.      Review of Systems  Musculoskeletal:  Positive for back pain.  See HPI      Studies Reviewed:   EKG Interpretation Date/Time:  Tuesday June 06 2023 14:12:41 EST Ventricular Rate:  81 PR Interval:  210 QRS Duration:  86 QT Interval:  368 QTC Calculation: 427 R Axis:   4  Text Interpretation: Sinus rhythm with 1st degree A-V block T wave abnormality, consider lateral ischemia (chronic) No significant change since last tracing Confirmed by Tereso Newcomer [phone]) on 06/06/2023 2:17:28 PM    Results   LABS - Chart Review  Potassium: 4.6 (06/01/2023) Creatinine: 1.51 (06/01/2023) ALT: 19 (06/01/2023) Total Cholesterol: 204 (11/07/2022) HDL: 98 (11/07/2022) Triglycerides: 114 (11/07/2022) LDL: 87 (11/07/2022) Hemoglobin: 11 (06/01/2023)      Risk Assessment/Calculations:          Physical Exam:   VS:  BP 122/60 (BP Location: Left Arm, Patient Position: Sitting, Cuff Size: Large)   Pulse 80   Ht 5\' 8"  (1.727 m)   Wt 260 lb (117.9 kg)   SpO2 98%   BMI 39.53 kg/m    Wt Readings from Last 3 Encounters:  06/06/23 260 lb (117.9 kg)  06/01/23 257 lb (116.6 kg)  05/11/23 263 lb 7.2 oz (119.5 kg)    Constitutional:      Appearance: Healthy appearance. Not in distress.  Neck:     Vascular: JVD normal.  Pulmonary:     Breath sounds: Scattered Wheezing present. No rales.  Cardiovascular:     Normal rate. Regular rhythm.  Murmurs: There is no murmur.  Edema:    Peripheral edema absent.  Abdominal:     Palpations: Abdomen is soft.  Skin:    General: Skin is warm and dry.        Assessment and Plan:   Assessment & Plan Preoperative cardiovascular examination Ms. Bayard perioperative risk of a major cardiac event is 11% according to the Revised Cardiac Risk Index (RCRI).  Therefore, she is at high risk for perioperative complications.   Her functional capacity is good at 4.74 METs according to the Duke Activity Status Index (DASI). Recommendations: According to ACC/AHA guidelines, no further cardiovascular testing needed.  The patient may  proceed to surgery at acceptable risk.   Antiplatelet and/or Anticoagulation Recommendations: If needed, Aspirin can be held for 7 days prior to her surgery.  Please resume Aspirin post operatively when it is felt to be safe from a bleeding standpoint.  Coronary artery disease involving native coronary artery of native heart without angina pectoris Status post CABG in 2012 with vein graft to the D2 and vein graft to the PDA.  Cardiac catheterization 2016 with patent bypass grafts.  Nuclear stress test in September 2021 negative for ischemia.  She is doing well without anginal symptoms.  Continue ASA 81 mg daily, Lipitor 80 mg daily.  Follow-up 4 to 6 months. S/P mitral valve repair History of fibroblastoma of the mitral valve status post resection and mitral valve repair in 2012 at the time of CABG.  Echocardiogram in September 2021 was normal EF and mitral valve repair without stenosis or regurgitation.  She is clinically doing well without shortness of breath or evidence of volume excess.  Continue SBE prophylaxis. Essential hypertension Blood pressure well-controlled.  Continue amlodipine 10 mg daily, carvedilol 25 mg twice daily, hydralazine 100 mg twice daily, Lasix 20 mg daily, valsartan/HCTZ 160/25 mg daily. Pure hypercholesterolemia LDL goal <55.  LDL in May 2024 above goal at 87.  She is getting ready to undergo surgery in the next couple of days.  Will hold off on any medication changes at this time.  Follow-up in 4 to 6 months.  If LDL remains above 55, add Zetia. Stage 3b chronic kidney disease (CKD) (HCC) Recent creatinine 1.51. Gastroesophageal reflux disease without esophagitis Pt needs refill of Protonix. Will refill for her but will need future refills from PCP.        Dispo:  Return in about 6 months (around 12/05/2023) for Routine Follow Up, w/ Dr. Mayford Knife.  Signed, Tereso Newcomer, PA-C

## 2023-06-06 NOTE — Telephone Encounter (Signed)
Notes faxed to surgeon Tereso Newcomer, PA-C    06/06/2023 2:54 PM

## 2023-06-06 NOTE — Assessment & Plan Note (Signed)
History of fibroblastoma of the mitral valve status post resection and mitral valve repair in 2012 at the time of CABG.  Echocardiogram in September 2021 was normal EF and mitral valve repair without stenosis or regurgitation.  She is clinically doing well without shortness of breath or evidence of volume excess.  Continue SBE prophylaxis.

## 2023-06-06 NOTE — Patient Instructions (Signed)
Medication Instructions:  Your physician recommends that you continue on your current medications as directed. Please refer to the Current Medication list given to you today.  *If you need a refill on your cardiac medications before your next appointment, please call your pharmacy*   Lab Work: None ordered  If you have labs (blood work) drawn today and your tests are completely normal, you will receive your results only by: MyChart Message (if you have MyChart) OR A paper copy in the mail If you have any lab test that is abnormal or we need to change your treatment, we will call you to review the results.   Testing/Procedures: None ordered   Follow-Up: At [facility], you and your health needs are our priority.  As part of our continuing mission to provide you with exceptional heart care, we have created designated Provider Care Teams.  These Care Teams include your primary Cardiologist (physician) and Advanced Practice Providers (APPs -  Physician Assistants and Nurse Practitioners) who all work together to provide you with the care you need, when you need it.  We recommend signing up for the patient portal called "MyChart".  Sign up information is provided on this After Visit Summary.  MyChart is used to connect with patients for Virtual Visits (Telemedicine).  Patients are able to view lab/test results, encounter notes, upcoming appointments, etc.  Non-urgent messages can be sent to your provider as well.   To learn more about what you can do with MyChart, go to [email or web address].    Your next appointment:   4-6 month(s)  Provider:   Armanda Magic, MD     Other Instructions

## 2023-06-06 NOTE — Assessment & Plan Note (Signed)
Recent creatinine 1.51.

## 2023-06-07 NOTE — Progress Notes (Signed)
Anesthesia Chart Review   Case: [id number] Date/Time: 06/08/23 0715   Procedure: XI ROBOT ASSISTED LAPAROSCOPIC PARTIAL COLECTOMY   Anesthesia type: General   Pre-op diagnosis: COLON MASS   Location[facility] ROOM 02 / [facility]   Surgeons: Romie Levee, MD       DISCUSSION:72 y.o. former smoker with h/o HTN, sleep apnea, CAD, S/p CABG (S-D2, S-PDA), resection of fibroelastoma, MV repair (Dr. Cornelius Moras) in 2012, DM II, CKD Stage III, breast cancer, colon mass scheduled for above procedure 06/08/2023 with Dr. Romie Levee.   LHC 04/03/2015: RCA mid 95, distal 100; RI ostial 95 (tiny branch); D2 100; LCx proximal 30; SVG-PDA, SVG-D2 patent  TTE 03/03/2020: EF 60-65, no RWMA, moderate LVH, GR 2 DD, normal RVSF, normal PASP, RVSP 29.4, s/p MV repair (no stenosis or regurgitation), AV sclerosis, RAP 3  Myoview 03/03/2020: No ischemia or infarction, EF 55, low risk  Pt last seen by cardiology 06/06/2023. Per OV note, "Ms. Kinnaird's perioperative risk of a major cardiac event is 11% according to the Revised Cardiac Risk Index (RCRI).  Therefore, she is at high risk for perioperative complications.   Her functional capacity is good at 4.74 METs according to the Duke Activity Status Index (DASI). Recommendations: According to ACC/AHA guidelines, no further cardiovascular testing needed.  The patient may proceed to surgery at acceptable risk.   Antiplatelet and/or Anticoagulation Recommendations: If needed, Aspirin can be held for 7 days prior to her surgery.  Please resume Aspirin post operatively when it is felt to be safe from a bleeding standpoint. "  VS: BP (!) 178/66 Comment: pt. has not take BP meds today  Pulse 81   Temp 36.9 C (Oral)   Ht 5\' 8"  (1.727 m)   Wt 116.6 kg   SpO2 92%   BMI 39.08 kg/m   PROVIDERS: Marcine Matar, MD is PCP   Primary Cardiologist: Armanda Magic, MD  LABS: Labs reviewed: Acceptable for surgery. (all labs ordered are listed, but only abnormal results are  displayed)  Labs Reviewed  COMPREHENSIVE METABOLIC PANEL - Abnormal; Notable for the following components:      Result Value   Glucose, Bld 181 (*)    BUN 29 (*)    Creatinine, Ser 1.51 (*)    GFR, Estimated 36 (*)    All other components within normal limits  CBC - Abnormal; Notable for the following components:   Hemoglobin 11.0 (*)    MCH 24.9 (*)    RDW 16.6 (*)    All other components within normal limits  GLUCOSE, CAPILLARY - Abnormal; Notable for the following components:   Glucose-Capillary 211 (*)    All other components within normal limits  SURGICAL PCR SCREEN  TYPE AND SCREEN     IMAGES:   EKG:   CV: Echo 03/03/2020 1. Left ventricular ejection fraction, by estimation, is 60 to 65%. The  left ventricle has normal function. The left ventricle has no regional  wall motion abnormalities. There is moderate left ventricular hypertrophy.  Left ventricular diastolic  parameters are consistent with Grade II diastolic dysfunction  (pseudonormalization).   2. Right ventricular systolic function is normal. The right ventricular  size is normal. There is normal pulmonary artery systolic pressure. The  estimated right ventricular systolic pressure is 29.4 mmHg.   3. Status post mitral valve repair. No significant stenosis or  regurgitation.   4. The aortic valve is tricuspid. Aortic valve regurgitation is not  visualized. Mild aortic valve sclerosis is present, with  no evidence of  aortic valve stenosis.   5. The inferior vena cava is normal in size with greater than 50%  respiratory variability, suggesting right atrial pressure of 3 mmHg.   Myocardial Perfusion 03/03/2020 Nuclear stress EF: 55%. There was no ST segment deviation noted during stress. No T wave inversion was noted during stress. The study is normal. This is a low risk study. The left ventricular ejection fraction is normal (55-65%).   1.  This is a likely normal myocardial perfusion imaging study  without evidence of ischemia or infarction.  There are reduced counts in the inferior segments consistent with diaphragm attenuation. 2.  Normal LV function, EF 55%. 3.  This is a low risk study. Past Medical History:  Diagnosis Date   Abscess in epidural space of L2-L5 lumbar spine 03/2006   Anemia, iron deficiency    Arthritis    Breast cancer (HCC) 06/12/2017   right breast   CAD (coronary artery disease), native coronary artery 03/30/2015   Cataract    Colon polyp    a. Multiple colonic polyps status post colonoscopy in October 2007, consistent with tubular adenoma, tubulovillous adenoma with no high-grade dysplasia or malignancy identified.    Constipation    Coronary artery disease    a. s/p CABG 06/2010: S-D2; S-PDA (at time of MV surgery).   Depression    Diabetes mellitus    type II   Family history of breast cancer    Family history of prostate cancer    Genetic testing 09/25/2017   Multi-Cancer panel (83 genes) @ [facility] - No pathogenic mutations detected   GERD (gastroesophageal reflux disease)    Hx of transient ischemic attack (TIA)    a. See stroke section.   Hyperlipidemia    Hypertension    Hypoxia    a. Has history of acute hypoxic respiratory failure in the setting of bronchitis/PNA or prior admissions.   MRSA infection    a. History of recurrent skin infection and soft tissue abscesses, with MRSA positive in the past.   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 mitral valve - s/p resection and MV repair 06/2010 Dr. Cornelius Moras.   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 Distal R MCA.   Stroke [facility])    a. 11/2009: mitral mass diagnosed at this time, also has distal R MCA stenosis, tx with coumadin. b. Readmitted 05/2010 with TIA symptoms - had not been taking Coumadin. s/p MV surgery 06/2010. Coumadin stopped 2013 after review of chart by Dr.  Jens Som since mass was removed (stroke felt possibly related to this).     Past Surgical History:  Procedure Laterality Date   BREAST BIOPSY Right 08/26/2019   benign   BREAST LUMPECTOMY Right    BREAST LUMPECTOMY WITH RADIOACTIVE SEED AND SENTINEL LYMPH NODE BIOPSY Right 07/20/2017   Procedure: RIGHT BREAST LUMPECTOMY WITH RADIOACTIVE SEED AND SENTINEL LYMPH NODE BIOPSY;  Surgeon: Ovidio Kin, MD;  Location: [facility] OR;  Service: General;  Laterality: Right;   CARDIAC CATHETERIZATION  04/03/2015   Procedure: Left Heart Cath and Cors/Grafts Angiography;  Surgeon: Peter M Swaziland, MD;  Location: [facility] INVASIVE CV LAB;  Service: Cardiovascular;;   CORONARY ARTERY BYPASS GRAFT  1/12   history of ankle fractures requiring surgery     LUMBAR LAMINECTOMY/DECOMPRESSION MICRODISCECTOMY Left 04/15/2015   Procedure:  LUMBAR LAMINECTOMY/DECOMPRESSION MICRODISCECTOMY 1 LEVEL;  Surgeon: Tressie Stalker, MD;  Location: [facility] NEURO ORS;  Service: Neurosurgery;  Laterality: Left;  Left L23 microdiskectomy   LUMBAR LAMINECTOMY/DECOMPRESSION MICRODISCECTOMY N/A 05/27/2020   Procedure: L2-3, L3-4  decompression;  Surgeon: Eldred Manges, MD;  Location: [facility] OR;  Service: Orthopedics;  Laterality: N/A;   MV repair and resection of mass  1/12   RESECTION DISTAL CLAVICAL Left 05/16/2019   Procedure: RESECTION DISTAL CLAVICAL;  Surgeon: Kathryne Hitch, MD;  Location: [facility];  Service: Orthopedics;  Laterality: Left;   SHOULDER ARTHROSCOPY WITH ROTATOR CUFF REPAIR AND SUBACROMIAL DECOMPRESSION Left 05/16/2019   Procedure: SHOULDER ARTHROSCOPY WITH ROTATOR CUFF REPAIR AND SUBACROMIAL DECOMPRESSION;  Surgeon: Kathryne Hitch, MD;  Location: [facility];  Service: Orthopedics;  Laterality: Left;   TONSILLECTOMY  1971   TOTAL ABDOMINAL HYSTERECTOMY  1978   TRIGGER FINGER RELEASE Right 12/02/2021   Procedure: RIGHT RING FINGER A-1 PULLEY RELEASE;  Surgeon: Kathryne Hitch, MD;   Location: [facility];  Service: Orthopedics;  Laterality: Right;   TRIGGER FINGER RELEASE Left 05/11/2023   Procedure: LEFT INDEX FINGER RELEASE TRIGGER FINGER;  Surgeon: Kathryne Hitch, MD;  Location: [facility];  Service: Orthopedics;  Laterality: Left;   VESICOVAGINAL FISTULA CLOSURE W/ TAH      MEDICATIONS:  albuterol (VENTOLIN HFA) 108 (90 Base) MCG/ACT inhaler   amLODipine (NORVASC) 10 MG tablet   aspirin EC 81 MG tablet   atorvastatin (LIPITOR) 80 MG tablet   buPROPion (WELLBUTRIN XL) 150 MG 24 hr tablet   carvedilol (COREG) 25 MG tablet   celecoxib (CELEBREX) 50 MG capsule   diclofenac Sodium (VOLTAREN) 1 % GEL   docusate sodium (COLACE) 100 MG capsule   DULoxetine (CYMBALTA) 30 MG capsule   FARXIGA 10 MG TABS tablet   FEROSUL 325 (65 Fe) MG tablet   furosemide (LASIX) 20 MG tablet   gabapentin (NEURONTIN) 300 MG capsule   hydrALAZINE (APRESOLINE) 100 MG tablet   HYDROcodone-acetaminophen (NORCO) 5-325 MG tablet   insulin glargine (LANTUS) 100 UNIT/ML Solostar Pen   Insulin Pen Needle (PEN NEEDLES) 31G X 8 MM MISC   letrozole (FEMARA) 2.5 MG tablet   methocarbamol (ROBAXIN) 500 MG tablet   Multiple Vitamin (MULTIVITAMIN WITH MINERALS) TABS tablet   pantoprazole (PROTONIX) 40 MG tablet   traMADol (ULTRAM) 50 MG tablet   traZODone (DESYREL) 50 MG tablet   valsartan-hydrochlorothiazide (DIOVAN-HCT) 160-25 MG tablet   No current facility-administered medications for this encounter.     Jodell Cipro Ward, PA-C [facility] [phone]

## 2023-06-07 NOTE — Anesthesia Preprocedure Evaluation (Addendum)
Anesthesia Evaluation  Patient identified by MRN, date of birth, ID band Patient awake    Reviewed: Allergy & Precautions, NPO status , Patient's Chart, lab work & pertinent test results  History of Anesthesia Complications Negative for: history of anesthetic complications  Airway Mallampati: II  TM Distance: >3 FB Neck ROM: Full    Dental  (+) Teeth Intact, Dental Advisory Given   Pulmonary sleep apnea , former smoker   Pulmonary exam normal breath sounds clear to auscultation       Cardiovascular hypertension, Pt. on medications and Pt. on home beta blockers + CAD and + CABG  Normal cardiovascular exam+ Valvular Problems/Murmurs  Rhythm:Regular Rate:Normal   '21 TTE - EF 60 to 65%. There is moderate left ventricular hypertrophy. Grade II diastolic dysfunction (pseudonormalization). Status post mitral valve repair. No significant stenosis or regurgitation. Mild aortic valve sclerosis     Neuro/Psych  PSYCHIATRIC DISORDERS  Depression    TIACVA, Residual Symptoms    GI/Hepatic Neg liver ROS,GERD  Medicated and Controlled,,  Endo/Other  diabetes, Type 2, Insulin Dependent, Oral Hypoglycemic Agents  Class 3 obesity Obesity   Renal/GU CRFRenal disease     Musculoskeletal  (+) Arthritis ,    Abdominal  (+) + obese  Peds  Hematology  (+) Blood dyscrasia, anemia   Anesthesia Other Findings   Reproductive/Obstetrics  Breast cancer                              Anesthesia Physical Anesthesia Plan  ASA: 4  Anesthesia Plan: General   Post-op Pain Management: Tylenol PO (pre-op)*, Minimal or no pain anticipated, Celebrex PO (pre-op)* and Dilaudid IV   Induction: Intravenous  PONV Risk Score and Plan: 2 and Ondansetron, Dexamethasone and Treatment may vary due to age or medical condition  Airway Management Planned: Oral ETT  Additional Equipment: ClearSight  Intra-op Plan:    Post-operative Plan: Extubation in OR  Informed Consent: I have reviewed the patients History and Physical, chart, labs and discussed the procedure including the risks, benefits and alternatives for the proposed anesthesia with the patient or authorized representative who has indicated his/her understanding and acceptance.     Dental advisory given  Plan Discussed with: CRNA and Anesthesiologist  Anesthesia Plan Comments: (See PAT note 06/01/2023 DISCUSSION:72 y.o. former smoker with h/o HTN, sleep apnea, CAD, S/p CABG (S-D2, S-PDA), resection of fibroelastoma, MV repair (Dr. Cornelius Moras) in 2012, DM II, CKD Stage III, breast cancer, colon mass scheduled for above procedure 06/08/2023 with Dr. Romie Levee.    LHC 04/03/2015: RCA mid 95, distal 100; RI ostial 95 (tiny branch); D2 100; LCx proximal 30; SVG-PDA, SVG-D2 patent   TTE 03/03/2020: EF 60-65, no RWMA, moderate LVH, GR 2 DD, normal RVSF, normal PASP, RVSP 29.4, s/p MV repair (no stenosis or regurgitation), AV sclerosis, RAP 3   Myoview 03/03/2020: No ischemia or infarction, EF 55, low risk   Pt last seen by cardiology 06/06/2023. Per OV note, "Ms. Brosious's perioperative risk of a major cardiac event is 11% according to the Revised Cardiac Risk Index (RCRI).  Therefore, she is at high risk for perioperative complications.   Her functional capacity is good at 4.74 METs according to the Duke Activity Status Index (DASI). Recommendations: According to ACC/AHA guidelines, no further cardiovascular testing needed.  The patient may proceed to surgery at acceptable risk.   Antiplatelet and/or Anticoagulation Recommendations: If needed, Aspirin can be held for 7 days prior  to her surgery.  Please resume Aspirin post operatively when it is felt to be safe from a bleeding standpoint. "   VS: BP (!) 178/66 Comment: pt. has not take BP meds today  Pulse 81   Temp 36.9 C (Oral)   Ht 5\' 8"  (1.727 m)   Wt 116.6 kg   SpO2 92%   BMI 39.08 kg/m    CV: Echo 03/03/2020 1. Left ventricular ejection fraction, by estimation, is 60 to 65%. The  left ventricle has normal function. The left ventricle has no regional  wall motion abnormalities. There is moderate left ventricular hypertrophy.  Left ventricular diastolic  parameters are consistent with Grade II diastolic dysfunction  (pseudonormalization).   2. Right ventricular systolic function is normal. The right ventricular  size is normal. There is normal pulmonary artery systolic pressure. The  estimated right ventricular systolic pressure is 29.4 mmHg.   3. Status post mitral valve repair. No significant stenosis or  regurgitation.   4. The aortic valve is tricuspid. Aortic valve regurgitation is not  visualized. Mild aortic valve sclerosis is present, with no evidence of  aortic valve stenosis.   5. The inferior vena cava is normal in size with greater than 50%  respiratory variability, suggesting right atrial pressure of 3 mmHg.    Myocardial Perfusion 03/03/2020  Nuclear stress EF: 55%.  There was no ST segment deviation noted during stress.  No T wave inversion was noted during stress.  The study is normal.  This is a low risk study.  The left ventricular ejection fraction is normal (55-65%).   1.  This is a likely normal myocardial perfusion imaging study without evidence of ischemia or infarction.  There are reduced counts in the inferior segments consistent with diaphragm attenuation. 2.  Normal LV function, EF  3.  This is a low risk study.  )        Anesthesia Quick Evaluation

## 2023-06-08 ENCOUNTER — Inpatient Hospital Stay (HOSPITAL_COMMUNITY)
Admission: RE | Admit: 2023-06-08 | Discharge: 2023-06-12 | DRG: 330 | Disposition: A | Discharge status: Home Health | Payer: Medicare PPO | Attending: General Surgery | Admitting: General Surgery

## 2023-06-08 ENCOUNTER — Encounter (HOSPITAL_COMMUNITY): Payer: Self-pay | Admitting: General Surgery

## 2023-06-08 ENCOUNTER — Other Ambulatory Visit: Payer: Self-pay

## 2023-06-08 ENCOUNTER — Inpatient Hospital Stay (HOSPITAL_COMMUNITY): Payer: Self-pay | Admitting: Anesthesiology

## 2023-06-08 ENCOUNTER — Encounter (HOSPITAL_COMMUNITY)
Admission: RE | Disposition: A | Discharge status: Home Health | Payer: Self-pay | Source: Home / Self Care | Attending: General Surgery

## 2023-06-08 ENCOUNTER — Inpatient Hospital Stay (HOSPITAL_COMMUNITY): Payer: Medicare PPO | Admitting: Anesthesiology

## 2023-06-08 DIAGNOSIS — K6389 Other specified diseases of intestine: Secondary | ICD-10-CM

## 2023-06-08 DIAGNOSIS — Z6841 Body Mass Index (BMI) 40.0 and over, adult: Secondary | ICD-10-CM | POA: Diagnosis not present

## 2023-06-08 DIAGNOSIS — Z8249 Family history of ischemic heart disease and other diseases of the circulatory system: Secondary | ICD-10-CM

## 2023-06-08 DIAGNOSIS — Z800 Family history of malignant neoplasm of digestive organs: Secondary | ICD-10-CM | POA: Diagnosis not present

## 2023-06-08 DIAGNOSIS — Z823 Family history of stroke: Secondary | ICD-10-CM

## 2023-06-08 DIAGNOSIS — Z853 Personal history of malignant neoplasm of breast: Secondary | ICD-10-CM | POA: Diagnosis not present

## 2023-06-08 DIAGNOSIS — Z87891 Personal history of nicotine dependence: Secondary | ICD-10-CM | POA: Diagnosis not present

## 2023-06-08 DIAGNOSIS — D122 Benign neoplasm of ascending colon: Principal | ICD-10-CM | POA: Diagnosis present

## 2023-06-08 DIAGNOSIS — D374 Neoplasm of uncertain behavior of colon: Secondary | ICD-10-CM | POA: Diagnosis not present

## 2023-06-08 DIAGNOSIS — Z90710 Acquired absence of both cervix and uterus: Secondary | ICD-10-CM

## 2023-06-08 DIAGNOSIS — E860 Dehydration: Secondary | ICD-10-CM | POA: Diagnosis present

## 2023-06-08 DIAGNOSIS — K219 Gastro-esophageal reflux disease without esophagitis: Secondary | ICD-10-CM | POA: Diagnosis present

## 2023-06-08 DIAGNOSIS — N1832 Chronic kidney disease, stage 3b: Secondary | ICD-10-CM | POA: Diagnosis not present

## 2023-06-08 DIAGNOSIS — J449 Chronic obstructive pulmonary disease, unspecified: Secondary | ICD-10-CM | POA: Diagnosis present

## 2023-06-08 DIAGNOSIS — Z79811 Long term (current) use of aromatase inhibitors: Secondary | ICD-10-CM | POA: Diagnosis not present

## 2023-06-08 DIAGNOSIS — Z79899 Other long term (current) drug therapy: Secondary | ICD-10-CM

## 2023-06-08 DIAGNOSIS — I2510 Atherosclerotic heart disease of native coronary artery without angina pectoris: Secondary | ICD-10-CM | POA: Diagnosis not present

## 2023-06-08 DIAGNOSIS — Z7984 Long term (current) use of oral hypoglycemic drugs: Secondary | ICD-10-CM

## 2023-06-08 DIAGNOSIS — E7800 Pure hypercholesterolemia, unspecified: Secondary | ICD-10-CM | POA: Diagnosis present

## 2023-06-08 DIAGNOSIS — Z833 Family history of diabetes mellitus: Secondary | ICD-10-CM

## 2023-06-08 DIAGNOSIS — K660 Peritoneal adhesions (postprocedural) (postinfection): Secondary | ICD-10-CM | POA: Diagnosis present

## 2023-06-08 DIAGNOSIS — Z923 Personal history of irradiation: Secondary | ICD-10-CM

## 2023-06-08 DIAGNOSIS — E1122 Type 2 diabetes mellitus with diabetic chronic kidney disease: Secondary | ICD-10-CM | POA: Diagnosis present

## 2023-06-08 DIAGNOSIS — Z8673 Personal history of transient ischemic attack (TIA), and cerebral infarction without residual deficits: Secondary | ICD-10-CM

## 2023-06-08 DIAGNOSIS — Z951 Presence of aortocoronary bypass graft: Secondary | ICD-10-CM | POA: Diagnosis not present

## 2023-06-08 DIAGNOSIS — E66813 Obesity, class 3: Secondary | ICD-10-CM | POA: Diagnosis present

## 2023-06-08 DIAGNOSIS — E1142 Type 2 diabetes mellitus with diabetic polyneuropathy: Secondary | ICD-10-CM | POA: Diagnosis not present

## 2023-06-08 DIAGNOSIS — I129 Hypertensive chronic kidney disease with stage 1 through stage 4 chronic kidney disease, or unspecified chronic kidney disease: Secondary | ICD-10-CM | POA: Diagnosis not present

## 2023-06-08 DIAGNOSIS — Z794 Long term (current) use of insulin: Secondary | ICD-10-CM

## 2023-06-08 DIAGNOSIS — Z803 Family history of malignant neoplasm of breast: Secondary | ICD-10-CM

## 2023-06-08 LAB — GLUCOSE, CAPILLARY
Glucose-Capillary: 187 mg/dL — ABNORMAL HIGH (ref 70–99)
Glucose-Capillary: 179 mg/dL — ABNORMAL HIGH (ref 70–99)
Glucose-Capillary: 185 mg/dL — ABNORMAL HIGH (ref 70–99)
Glucose-Capillary: 293 mg/dL — ABNORMAL HIGH (ref 70–99)
Glucose-Capillary: 254 mg/dL — ABNORMAL HIGH (ref 70–99)

## 2023-06-08 LAB — TYPE AND SCREEN
ABO/RH(D): AB POS
Antibody Screen: NEGATIVE

## 2023-06-08 SURGERY — COLECTOMY, PARTIAL, ROBOT-ASSISTED, LAPAROSCOPIC
Anesthesia: General

## 2023-06-08 MED ORDER — KCL IN DEXTROSE-NACL 20-5-0.45 MEQ/L-%-% IV SOLN
INTRAVENOUS | Status: DC
  Filled 2023-06-08: qty 1000

## 2023-06-08 MED ORDER — ROCURONIUM BROMIDE 100 MG/10ML IV SOLN
INTRAVENOUS | Status: DC | PRN
  Administered 2023-06-08: 20 mg via INTRAVENOUS
  Administered 2023-06-08: 100 mg via INTRAVENOUS

## 2023-06-08 MED ORDER — INSULIN GLARGINE-YFGN 100 UNIT/ML ~~LOC~~ SOLN
10.0000 [IU] | Freq: Every day | SUBCUTANEOUS | Status: DC
  Administered 2023-06-08 – 2023-06-11 (×4): 10 [IU] via SUBCUTANEOUS
  Filled 2023-06-08 (×5): qty 0.1

## 2023-06-08 MED ORDER — PROPOFOL 10 MG/ML IV BOLUS
INTRAVENOUS | Status: DC | PRN
  Administered 2023-06-08: 150 mg via INTRAVENOUS

## 2023-06-08 MED ORDER — PANTOPRAZOLE SODIUM 40 MG PO TBEC
40.0000 mg | DELAYED_RELEASE_TABLET | Freq: Every day | ORAL | Status: DC
  Administered 2023-06-09 – 2023-06-12 (×4): 40 mg via ORAL
  Filled 2023-06-08 (×4): qty 1

## 2023-06-08 MED ORDER — METHYLENE BLUE (ANTIDOTE) 1 % IV SOLN
INTRAVENOUS | Status: AC
Start: 2023-06-08 — End: ?
  Filled 2023-06-08: qty 10

## 2023-06-08 MED ORDER — PHENYLEPHRINE HCL (PRESSORS) 10 MG/ML IV SOLN
INTRAVENOUS | Status: DC | PRN
  Administered 2023-06-08: 120 ug via INTRAVENOUS

## 2023-06-08 MED ORDER — SODIUM CHLORIDE 0.9 % IV SOLN
2.0000 g | INTRAVENOUS | Status: AC
  Administered 2023-06-08: 2 g via INTRAVENOUS
  Filled 2023-06-08: qty 2

## 2023-06-08 MED ORDER — FENTANYL CITRATE PF 50 MCG/ML IJ SOSY
25.0000 ug | PREFILLED_SYRINGE | INTRAMUSCULAR | Status: DC | PRN
  Administered 2023-06-08: 50 ug via INTRAVENOUS

## 2023-06-08 MED ORDER — BUPIVACAINE LIPOSOME 1.3 % IJ SUSP: 20.0000 mL | Freq: Once | INTRAMUSCULAR | Status: DC

## 2023-06-08 MED ORDER — DIPHENHYDRAMINE HCL 12.5 MG/5ML PO ELIX: 12.5000 mg | ORAL_SOLUTION | Freq: Four times a day (QID) | ORAL | Status: DC | PRN

## 2023-06-08 MED ORDER — GLYCOPYRROLATE 0.2 MG/ML IJ SOLN
INTRAMUSCULAR | Status: DC | PRN
  Administered 2023-06-08: .2 mg via INTRAVENOUS

## 2023-06-08 MED ORDER — GABAPENTIN 100 MG PO CAPS
300.0000 mg | ORAL_CAPSULE | Freq: Two times a day (BID) | ORAL | Status: DC
  Administered 2023-06-08 – 2023-06-12 (×9): 300 mg via ORAL
  Filled 2023-06-08 (×9): qty 3

## 2023-06-08 MED ORDER — DOCUSATE SODIUM 100 MG PO CAPS: 100.0000 mg | ORAL_CAPSULE | Freq: Every day | ORAL | Status: DC | PRN

## 2023-06-08 MED ORDER — TRAZODONE HCL 50 MG PO TABS
50.0000 mg | ORAL_TABLET | Freq: Every evening | ORAL | Status: DC | PRN
  Administered 2023-06-08 – 2023-06-11 (×4): 50 mg via ORAL
  Filled 2023-06-08 (×4): qty 1

## 2023-06-08 MED ORDER — ENSURE PRE-SURGERY PO LIQD
296.0000 mL | Freq: Once | ORAL | Status: DC
  Filled 2023-06-08: qty 296

## 2023-06-08 MED ORDER — DULOXETINE HCL 30 MG PO CPEP
30.0000 mg | ORAL_CAPSULE | Freq: Every day | ORAL | Status: DC
  Administered 2023-06-09 – 2023-06-12 (×4): 30 mg via ORAL
  Filled 2023-06-08 (×4): qty 1

## 2023-06-08 MED ORDER — FENTANYL CITRATE (PF) 100 MCG/2ML IJ SOLN
INTRAMUSCULAR | Status: AC
  Filled 2023-06-08: qty 2

## 2023-06-08 MED ORDER — PROPOFOL 10 MG/ML IV BOLUS
INTRAVENOUS | Status: AC
  Filled 2023-06-08: qty 20

## 2023-06-08 MED ORDER — ALVIMOPAN 12 MG PO CAPS
12.0000 mg | ORAL_CAPSULE | Freq: Two times a day (BID) | ORAL | Status: DC
  Administered 2023-06-09 – 2023-06-10 (×3): 12 mg via ORAL
  Filled 2023-06-08 (×3): qty 1

## 2023-06-08 MED ORDER — ALBUTEROL SULFATE HFA 108 (90 BASE) MCG/ACT IN AERS: 2.0000 | INHALATION_SPRAY | Freq: Four times a day (QID) | RESPIRATORY_TRACT | Status: DC | PRN

## 2023-06-08 MED ORDER — ALBUTEROL SULFATE (2.5 MG/3ML) 0.083% IN NEBU: 2.5000 mg | INHALATION_SOLUTION | Freq: Four times a day (QID) | RESPIRATORY_TRACT | Status: DC | PRN

## 2023-06-08 MED ORDER — AMLODIPINE BESYLATE 10 MG PO TABS
10.0000 mg | ORAL_TABLET | Freq: Every day | ORAL | Status: DC
  Administered 2023-06-09 – 2023-06-12 (×4): 10 mg via ORAL
  Filled 2023-06-08 (×4): qty 1

## 2023-06-08 MED ORDER — ALUM & MAG HYDROXIDE-SIMETH 200-200-20 MG/5ML PO SUSP: 30.0000 mL | Freq: Four times a day (QID) | ORAL | Status: DC | PRN

## 2023-06-08 MED ORDER — ACETAMINOPHEN 500 MG PO TABS
1000.0000 mg | ORAL_TABLET | ORAL | Status: AC
  Administered 2023-06-08: 1000 mg via ORAL
  Filled 2023-06-08: qty 2

## 2023-06-08 MED ORDER — ACETAMINOPHEN 160 MG/5ML PO SOLN: 325.0000 mg | ORAL | Status: DC | PRN

## 2023-06-08 MED ORDER — HYDROCODONE-ACETAMINOPHEN 5-325 MG PO TABS
1.0000 | ORAL_TABLET | Freq: Four times a day (QID) | ORAL | Status: DC | PRN
  Administered 2023-06-08 – 2023-06-09 (×3): 2 via ORAL
  Administered 2023-06-09 – 2023-06-12 (×9): 1 via ORAL
  Filled 2023-06-08: qty 1
  Filled 2023-06-08: qty 2
  Filled 2023-06-08 (×8): qty 1
  Filled 2023-06-08 (×2): qty 2

## 2023-06-08 MED ORDER — IRBESARTAN 150 MG PO TABS
150.0000 mg | ORAL_TABLET | Freq: Every day | ORAL | Status: DC
  Administered 2023-06-09: 150 mg via ORAL
  Filled 2023-06-08: qty 1

## 2023-06-08 MED ORDER — INSULIN ASPART 100 UNIT/ML IJ SOLN
INTRAMUSCULAR | Status: AC
  Filled 2023-06-08: qty 1

## 2023-06-08 MED ORDER — DIPHENHYDRAMINE HCL 50 MG/ML IJ SOLN: 12.5000 mg | Freq: Four times a day (QID) | INTRAMUSCULAR | Status: DC | PRN

## 2023-06-08 MED ORDER — ATORVASTATIN CALCIUM 20 MG PO TABS
80.0000 mg | ORAL_TABLET | Freq: Every day | ORAL | Status: DC
Start: 2023-06-08 — End: 2023-06-12
  Administered 2023-06-08 – 2023-06-12 (×5): 80 mg via ORAL
  Filled 2023-06-08 (×5): qty 4

## 2023-06-08 MED ORDER — HYDROMORPHONE HCL 1 MG/ML IJ SOLN
0.5000 mg | INTRAMUSCULAR | Status: DC | PRN
  Administered 2023-06-08: 0.5 mg via INTRAVENOUS
  Filled 2023-06-08 (×2): qty 0.5

## 2023-06-08 MED ORDER — CARVEDILOL 25 MG PO TABS
25.0000 mg | ORAL_TABLET | Freq: Two times a day (BID) | ORAL | Status: DC
  Administered 2023-06-08 – 2023-06-12 (×8): 25 mg via ORAL
  Filled 2023-06-08 (×8): qty 1

## 2023-06-08 MED ORDER — ALVIMOPAN 12 MG PO CAPS
12.0000 mg | ORAL_CAPSULE | ORAL | Status: AC
  Administered 2023-06-08: 12 mg via ORAL
  Filled 2023-06-08: qty 1

## 2023-06-08 MED ORDER — LACTATED RINGERS IV SOLN: INTRAVENOUS | Status: DC

## 2023-06-08 MED ORDER — ONDANSETRON HCL 4 MG/2ML IJ SOLN
INTRAMUSCULAR | Status: AC
Start: 2023-06-08 — End: ?
  Filled 2023-06-08: qty 2

## 2023-06-08 MED ORDER — FUROSEMIDE 20 MG PO TABS
20.0000 mg | ORAL_TABLET | Freq: Every day | ORAL | Status: DC
  Administered 2023-06-09 – 2023-06-12 (×4): 20 mg via ORAL
  Filled 2023-06-08 (×4): qty 1

## 2023-06-08 MED ORDER — INSULIN ASPART 100 UNIT/ML IJ SOLN
0.0000 [IU] | INTRAMUSCULAR | Status: DC | PRN
  Administered 2023-06-08: 2 [IU] via SUBCUTANEOUS

## 2023-06-08 MED ORDER — FENTANYL CITRATE PF 50 MCG/ML IJ SOSY
PREFILLED_SYRINGE | INTRAMUSCULAR | Status: AC
  Administered 2023-06-08: 50 ug via INTRAVENOUS
  Filled 2023-06-08: qty 1

## 2023-06-08 MED ORDER — ROCURONIUM BROMIDE 10 MG/ML (PF) SYRINGE
PREFILLED_SYRINGE | INTRAVENOUS | Status: AC
Start: 2023-06-08 — End: ?
  Filled 2023-06-08: qty 20

## 2023-06-08 MED ORDER — ONDANSETRON HCL 4 MG/2ML IJ SOLN: 4.0000 mg | Freq: Four times a day (QID) | INTRAMUSCULAR | Status: DC | PRN

## 2023-06-08 MED ORDER — MEPERIDINE HCL 50 MG/ML IJ SOLN: 6.2500 mg | INTRAMUSCULAR | Status: DC | PRN

## 2023-06-08 MED ORDER — BUPIVACAINE LIPOSOME 1.3 % IJ SUSP
INTRAMUSCULAR | Status: AC
  Filled 2023-06-08: qty 20

## 2023-06-08 MED ORDER — SACCHAROMYCES BOULARDII 250 MG PO CAPS
250.0000 mg | ORAL_CAPSULE | Freq: Two times a day (BID) | ORAL | Status: DC
  Administered 2023-06-08 – 2023-06-12 (×9): 250 mg via ORAL
  Filled 2023-06-08 (×9): qty 1

## 2023-06-08 MED ORDER — CHLORHEXIDINE GLUCONATE 0.12 % MT SOLN
15.0000 mL | Freq: Once | OROMUCOSAL | Status: AC
Start: 2023-06-08 — End: 2023-06-08
  Administered 2023-06-08: 15 mL via OROMUCOSAL

## 2023-06-08 MED ORDER — ENSURE PRE-SURGERY PO LIQD
592.0000 mL | Freq: Once | ORAL | Status: DC
  Filled 2023-06-08: qty 592

## 2023-06-08 MED ORDER — RINGERS IRRIGATION IR SOLN
Status: DC | PRN
  Administered 2023-06-08: 1000 mL

## 2023-06-08 MED ORDER — BUPIVACAINE LIPOSOME 1.3 % IJ SUSP
INTRAMUSCULAR | Status: DC | PRN
  Administered 2023-06-08: 20 mL

## 2023-06-08 MED ORDER — ONDANSETRON HCL 4 MG PO TABS: 4.0000 mg | ORAL_TABLET | Freq: Four times a day (QID) | ORAL | Status: DC | PRN

## 2023-06-08 MED ORDER — INSULIN ASPART 100 UNIT/ML IJ SOLN
0.0000 [IU] | Freq: Three times a day (TID) | INTRAMUSCULAR | Status: DC
Start: 2023-06-08 — End: 2023-06-12
  Administered 2023-06-08: 11 [IU] via SUBCUTANEOUS
  Administered 2023-06-09: 7 [IU] via SUBCUTANEOUS
  Administered 2023-06-09 – 2023-06-10 (×2): 3 [IU] via SUBCUTANEOUS
  Administered 2023-06-10 – 2023-06-11 (×3): 4 [IU] via SUBCUTANEOUS
  Administered 2023-06-12: 7 [IU] via SUBCUTANEOUS

## 2023-06-08 MED ORDER — HYDROCHLOROTHIAZIDE 25 MG PO TABS
25.0000 mg | ORAL_TABLET | Freq: Every day | ORAL | Status: DC
  Administered 2023-06-09 – 2023-06-12 (×4): 25 mg via ORAL
  Filled 2023-06-08 (×4): qty 1

## 2023-06-08 MED ORDER — HYDRALAZINE HCL 50 MG PO TABS
100.0000 mg | ORAL_TABLET | Freq: Two times a day (BID) | ORAL | Status: DC
  Administered 2023-06-08 – 2023-06-12 (×8): 100 mg via ORAL
  Filled 2023-06-08 (×8): qty 2

## 2023-06-08 MED ORDER — BUPIVACAINE-EPINEPHRINE 0.25% -1:200000 IJ SOLN
INTRAMUSCULAR | Status: DC | PRN
  Administered 2023-06-08: 30 mL

## 2023-06-08 MED ORDER — BUPIVACAINE-EPINEPHRINE 0.25% -1:200000 IJ SOLN
INTRAMUSCULAR | Status: AC
  Filled 2023-06-08: qty 1

## 2023-06-08 MED ORDER — PHENYLEPHRINE HCL (PRESSORS) 10 MG/ML IV SOLN
INTRAVENOUS | Status: AC
Start: 2023-06-08 — End: ?
  Filled 2023-06-08: qty 1

## 2023-06-08 MED ORDER — ORAL CARE MOUTH RINSE: 15.0000 mL | Freq: Once | OROMUCOSAL | Status: AC

## 2023-06-08 MED ORDER — FENTANYL CITRATE PF 50 MCG/ML IJ SOSY
PREFILLED_SYRINGE | INTRAMUSCULAR | Status: AC
  Filled 2023-06-08: qty 1

## 2023-06-08 MED ORDER — SUGAMMADEX SODIUM 200 MG/2ML IV SOLN
INTRAVENOUS | Status: DC | PRN
  Administered 2023-06-08: 200 mg via INTRAVENOUS

## 2023-06-08 MED ORDER — MIDAZOLAM HCL 2 MG/2ML IJ SOLN
INTRAMUSCULAR | Status: AC
  Filled 2023-06-08: qty 2

## 2023-06-08 MED ORDER — PHENYLEPHRINE HCL (PRESSORS) 10 MG/ML IV SOLN
INTRAVENOUS | Status: AC
  Filled 2023-06-08: qty 1

## 2023-06-08 MED ORDER — ONDANSETRON HCL 4 MG/2ML IJ SOLN: 4.0000 mg | Freq: Once | INTRAMUSCULAR | Status: DC | PRN

## 2023-06-08 MED ORDER — HYDRALAZINE HCL 100 MG PO TABS: 100.0000 mg | ORAL_TABLET | Freq: Two times a day (BID) | ORAL | Status: DC

## 2023-06-08 MED ORDER — FENTANYL CITRATE (PF) 100 MCG/2ML IJ SOLN
INTRAMUSCULAR | Status: DC | PRN
  Administered 2023-06-08: 50 ug via INTRAVENOUS

## 2023-06-08 MED ORDER — SIMETHICONE 80 MG PO CHEW: 40.0000 mg | CHEWABLE_TABLET | Freq: Four times a day (QID) | ORAL | Status: DC | PRN

## 2023-06-08 MED ORDER — OXYCODONE HCL 5 MG/5ML PO SOLN: 5.0000 mg | Freq: Once | ORAL | Status: DC | PRN

## 2023-06-08 MED ORDER — HEPARIN SODIUM (PORCINE) 5000 UNIT/ML IJ SOLN
5000.0000 [IU] | Freq: Once | INTRAMUSCULAR | Status: AC
  Administered 2023-06-08: 5000 [IU] via SUBCUTANEOUS
  Filled 2023-06-08: qty 1

## 2023-06-08 MED ORDER — ENSURE SURGERY PO LIQD
237.0000 mL | Freq: Two times a day (BID) | ORAL | Status: DC
  Administered 2023-06-10 – 2023-06-12 (×4): 237 mL via ORAL

## 2023-06-08 MED ORDER — VALSARTAN-HYDROCHLOROTHIAZIDE 160-25 MG PO TABS: 1.0000 | ORAL_TABLET | Freq: Every day | ORAL | Status: DC

## 2023-06-08 MED ORDER — OXYCODONE HCL 5 MG PO TABS: 5.0000 mg | ORAL_TABLET | Freq: Once | ORAL | Status: DC | PRN

## 2023-06-08 MED ORDER — GABAPENTIN 100 MG PO CAPS: 300.0000 mg | ORAL_CAPSULE | Freq: Three times a day (TID) | ORAL | Status: DC

## 2023-06-08 MED ORDER — ACETAMINOPHEN 325 MG PO TABS: 325.0000 mg | ORAL_TABLET | ORAL | Status: DC | PRN

## 2023-06-08 MED ORDER — SODIUM CHLORIDE 0.9 % IV SOLN
2.0000 g | Freq: Two times a day (BID) | INTRAVENOUS | Status: AC
  Administered 2023-06-08: 2 g via INTRAVENOUS
  Filled 2023-06-08: qty 2

## 2023-06-08 MED ORDER — LIDOCAINE HCL (CARDIAC) PF 100 MG/5ML IV SOSY
PREFILLED_SYRINGE | INTRAVENOUS | Status: DC | PRN
  Administered 2023-06-08: 100 mg via INTRAVENOUS

## 2023-06-08 MED ORDER — BUPROPION HCL ER (XL) 150 MG PO TB24
150.0000 mg | ORAL_TABLET | Freq: Two times a day (BID) | ORAL | Status: DC
  Administered 2023-06-08 – 2023-06-12 (×8): 150 mg via ORAL
  Filled 2023-06-08 (×8): qty 1

## 2023-06-08 MED ORDER — PHENYLEPHRINE HCL-NACL 20-0.9 MG/250ML-% IV SOLN
INTRAVENOUS | Status: DC | PRN
  Administered 2023-06-08: 40 ug/min via INTRAVENOUS

## 2023-06-08 MED ORDER — CELECOXIB 200 MG PO CAPS
200.0000 mg | ORAL_CAPSULE | Freq: Once | ORAL | Status: AC
  Administered 2023-06-08: 200 mg via ORAL
  Filled 2023-06-08: qty 1

## 2023-06-08 MED ORDER — ENOXAPARIN SODIUM 40 MG/0.4ML IJ SOSY
40.0000 mg | PREFILLED_SYRINGE | INTRAMUSCULAR | Status: DC
  Administered 2023-06-09 – 2023-06-10 (×2): 40 mg via SUBCUTANEOUS
  Filled 2023-06-08 (×3): qty 0.4

## 2023-06-08 MED ORDER — ONDANSETRON HCL 4 MG/2ML IJ SOLN
INTRAMUSCULAR | Status: DC | PRN
  Administered 2023-06-08: 4 mg via INTRAVENOUS

## 2023-06-08 MED ORDER — DEXAMETHASONE SODIUM PHOSPHATE 10 MG/ML IJ SOLN
INTRAMUSCULAR | Status: DC | PRN
  Administered 2023-06-08: 5 mg via INTRAVENOUS

## 2023-06-08 MED ORDER — MIDAZOLAM HCL 5 MG/5ML IJ SOLN
INTRAMUSCULAR | Status: DC | PRN
  Administered 2023-06-08: 1 mg via INTRAVENOUS

## 2023-06-08 SURGICAL SUPPLY — 80 items
BAG COUNTER SPONGE SURGICOUNT (BAG) ×1 IMPLANT
BLADE EXTENDED COATED 6.5IN (ELECTRODE) IMPLANT
CANNULA REDUCER 12-8 DVNC XI (CANNULA) IMPLANT
COVER SURGICAL LIGHT HANDLE (MISCELLANEOUS) ×2 IMPLANT
COVER TIP SHEARS 8 DVNC (MISCELLANEOUS) ×1 IMPLANT
DEFOGGER SCOPE WARMER CLEARIFY (MISCELLANEOUS) IMPLANT
DRAIN CHANNEL 19F RND (DRAIN) IMPLANT
DRAPE ARM DVNC X/XI (DISPOSABLE) ×4 IMPLANT
DRAPE COLUMN DVNC XI (DISPOSABLE) ×1 IMPLANT
DRAPE SURG IRRIG POUCH 19X23 (DRAPES) ×1 IMPLANT
DRIVER NDL LRG 8 DVNC XI (INSTRUMENTS) ×1 IMPLANT
DRIVER NDLE LRG 8 DVNC XI (INSTRUMENTS) ×1
DRSG OPSITE POSTOP 4X10 (GAUZE/BANDAGES/DRESSINGS) IMPLANT
DRSG OPSITE POSTOP 4X6 (GAUZE/BANDAGES/DRESSINGS) IMPLANT
DRSG OPSITE POSTOP 4X8 (GAUZE/BANDAGES/DRESSINGS) IMPLANT
ELECT PENCIL ROCKER SW 15FT (MISCELLANEOUS) ×1 IMPLANT
ELECT REM PT RETURN 15FT ADLT (MISCELLANEOUS) ×1 IMPLANT
ENDOLOOP SUT PDS II 0 18 (SUTURE) IMPLANT
EVACUATOR SILICONE 100CC (DRAIN) IMPLANT
GLOVE BIO SURGEON STRL SZ 6.5 (GLOVE) ×3 IMPLANT
GLOVE INDICATOR 6.5 STRL GRN (GLOVE) ×3 IMPLANT
GOWN SRG XL LVL 4 BRTHBL STRL (GOWNS) ×1 IMPLANT
GOWN STRL REUS W/ TWL XL LVL3 (GOWN DISPOSABLE) ×3 IMPLANT
GRASPER SUT TROCAR 14GX15 (MISCELLANEOUS) IMPLANT
GRASPER TIP-UP FEN DVNC XI (INSTRUMENTS) ×1 IMPLANT
HOLDER FOLEY CATH W/STRAP (MISCELLANEOUS) ×1 IMPLANT
IRRIG SUCT STRYKERFLOW 2 WTIP (MISCELLANEOUS) ×1
IRRIGATION SUCT STRKRFLW 2 WTP (MISCELLANEOUS) ×1 IMPLANT
KIT PROCEDURE DVNC SI (MISCELLANEOUS) IMPLANT
KIT TURNOVER KIT A (KITS) IMPLANT
NDL INSUFFLATION 14GA 120MM (NEEDLE) ×1 IMPLANT
NDL SPNL 22GX3.5 QUINCKE BK (NEEDLE) IMPLANT
NEEDLE INSUFFLATION 14GA 120MM (NEEDLE) ×1
NEEDLE SPNL 22GX3.5 QUINCKE BK (NEEDLE) ×1
PACK CARDIOVASCULAR III (CUSTOM PROCEDURE TRAY) ×1 IMPLANT
PACK COLON (CUSTOM PROCEDURE TRAY) ×1 IMPLANT
PAD POSITIONING PINK XL (MISCELLANEOUS) ×1 IMPLANT
RELOAD STAPLE 60 2.5 WHT DVNC (STAPLE) IMPLANT
RELOAD STAPLE 60 3.5 BLU DVNC (STAPLE) IMPLANT
RELOAD STAPLE 60 4.3 GRN DVNC (STAPLE) IMPLANT
RETRACTOR WND ALEXIS 18 MED (MISCELLANEOUS) IMPLANT
RTRCTR WOUND ALEXIS 18CM MED (MISCELLANEOUS)
SCISSORS LAP 5X35 DISP (ENDOMECHANICALS) IMPLANT
SCISSORS MNPLR CVD DVNC XI (INSTRUMENTS) ×1 IMPLANT
SEAL UNIV 5-12 XI (MISCELLANEOUS) ×3 IMPLANT
SEALER VESSEL EXT DVNC XI (MISCELLANEOUS) ×1 IMPLANT
SOL ELECTROSURG ANTI STICK (MISCELLANEOUS) ×1
SOLUTION ELECTROSURG ANTI STCK (MISCELLANEOUS) ×1 IMPLANT
SPIKE FLUID TRANSFER (MISCELLANEOUS) IMPLANT
STAPLER 60 SUREFORM DVNC (STAPLE) IMPLANT
STAPLER ECHELON POWER CIR 29 (STAPLE) IMPLANT
STAPLER ECHELON POWER CIR 31 (STAPLE) IMPLANT
STAPLER RELOAD 2.5X60 WHT DVNC (STAPLE) ×1
STAPLER RELOAD 3.5X60 BLU DVNC (STAPLE) ×1
STAPLER RELOAD 4.3X60 GRN DVNC (STAPLE) ×1
STOPCOCK 4 WAY LG BORE MALE ST (IV SETS) ×2 IMPLANT
SUT ETHILON 2 0 PS N (SUTURE) IMPLANT
SUT NOVA NAB GS-21 1 T12 (SUTURE) ×2 IMPLANT
SUT PROLENE 2 0 KS (SUTURE) IMPLANT
SUT SILK 2 0 SH CR/8 (SUTURE) IMPLANT
SUT SILK 2-0 18XBRD TIE 12 (SUTURE) ×1 IMPLANT
SUT SILK 3 0 SH CR/8 (SUTURE) ×1 IMPLANT
SUT SILK 3-0 18XBRD TIE 12 (SUTURE) IMPLANT
SUT V-LOC BARB 180 2/0GR6 GS22 (SUTURE) ×2
SUT VIC AB 2-0 SH 18 (SUTURE) IMPLANT
SUT VIC AB 2-0 SH 27X BRD (SUTURE) IMPLANT
SUT VIC AB 3-0 SH 18 (SUTURE) IMPLANT
SUT VIC AB 4-0 PS2 27 (SUTURE) ×2 IMPLANT
SUT VICRYL 0 UR6 27IN ABS (SUTURE) ×1 IMPLANT
SUTURE V-LC BRB 180 2/0GR6GS22 (SUTURE) IMPLANT
SYR 20ML ECCENTRIC (SYRINGE) ×1 IMPLANT
SYS LAPSCP GELPORT 120MM (MISCELLANEOUS)
SYS WOUND ALEXIS 18CM MED (MISCELLANEOUS)
SYSTEM LAPSCP GELPORT 120MM (MISCELLANEOUS) IMPLANT
SYSTEM WOUND ALEXIS 18CM MED (MISCELLANEOUS) IMPLANT
TOWEL OR 17X26 10 PK STRL BLUE (TOWEL DISPOSABLE) IMPLANT
TRAY FOLEY MTR SLVR 16FR STAT (SET/KITS/TRAYS/PACK) ×1 IMPLANT
TROCAR ADV FIXATION 5X100MM (TROCAR) ×1 IMPLANT
TUBING CONNECTING 10 (TUBING) ×2 IMPLANT
TUBING INSUFFLATION 10FT LAP (TUBING) ×1 IMPLANT

## 2023-06-08 NOTE — Plan of Care (Signed)
  Problem: Education: Goal: Understanding of discharge needs will improve Outcome: Progressing Goal: Verbalization of understanding of the causes of altered bowel function will improve Outcome: Progressing   Problem: Activity: Goal: Ability to tolerate increased activity will improve Outcome: Progressing

## 2023-06-08 NOTE — Anesthesia Procedure Notes (Signed)
Procedure Name: Intubation Date/Time: 06/08/2023 8:01 AM  Performed by: Nathen May, CRNAPre-anesthesia Checklist: Patient identified, Emergency Drugs available, Suction available and Patient being monitored Patient Re-evaluated:Patient Re-evaluated prior to induction Oxygen Delivery Method: Circle System Utilized Preoxygenation: Pre-oxygenation with 100% oxygen Induction Type: IV induction Ventilation: Mask ventilation without difficulty Laryngoscope Size: Mac and 3 Grade View: Grade II Tube type: Oral Tube size: 7.5 mm Number of attempts: 1 Airway Equipment and Method: Stylet and Oral airway Placement Confirmation: ETT inserted through vocal cords under direct vision, positive ETCO2 and breath sounds checked- equal and bilateral Tube secured with: Tape Dental Injury: Teeth and Oropharynx as per pre-operative assessment

## 2023-06-08 NOTE — Anesthesia Postprocedure Evaluation (Signed)
Anesthesia Post Note  Patient: [name]  Procedure(s) Performed: XI ROBOT ASSISTED LAPAROSCOPIC PARTIAL COLECTOMY     Patient location during evaluation: PACU Anesthesia Type: General Level of consciousness: awake and alert Pain management: pain level controlled Vital Signs Assessment: post-procedure vital signs reviewed and stable Respiratory status: spontaneous breathing, nonlabored ventilation, respiratory function stable and patient connected to nasal cannula oxygen Cardiovascular status: blood pressure returned to baseline and stable Postop Assessment: no apparent nausea or vomiting Anesthetic complications: no   No notable events documented.  Last Vitals:  Vitals:   06/08/23 1045 06/08/23 1100  BP: (!) 126/52 (!) 119/52  Pulse: (!) 58 (!) 55  Resp: (!) 21 16  Temp:    SpO2: 100% 96%    Last Pain:  Vitals:   06/08/23 1100  TempSrc:   PainSc: 8                  Chijioke Lasser

## 2023-06-08 NOTE — Op Note (Signed)
06/08/2023  10:35 AM  PATIENT:  [name]  72 y.o. female  Patient Care Team: Marcine Matar, MD as PCP - General (Internal Medicine) Quintella Reichert, MD as PCP - Cardiology (Cardiology) Serena Croissant, MD as Consulting Physician (Hematology and Oncology) Dorothy Puffer, MD as Consulting Physician (Radiation Oncology) Axel Filler, Larna Daughters, NP as Nurse Practitioner (Hematology and Oncology) Ovidio Kin, MD as Consulting Physician (General Surgery) Marcine Matar, MD (Internal Medicine) Valentino Nose Bernestine Amass, MD as Consulting Physician (Internal Medicine) Levert Feinstein, MD as Consulting Physician (Neurology)  PRE-OPERATIVE DIAGNOSIS:  COLON MASS  POST-OPERATIVE DIAGNOSIS:  COLON MASS  PROCEDURE:  XI ROBOT ASSISTED RIGHT COLECTOMY  Surgeon(s): Romie Levee, MD Karie Soda, MD  ASSISTANT: none   ANESTHESIA:   local and general  EBL:  Total I/O In: 700 [I.V.:600; IV Piggyback:100] Out: 175 [Urine:125; Blood:50]  Delay start of Pharmacological VTE agent (>24hrs) due to surgical blood loss or risk of bleeding:  no  DRAINS: none   SPECIMEN:  Source of Specimen:  R colon  DISPOSITION OF SPECIMEN:  PATHOLOGY  COUNTS:  YES  PLAN OF CARE: Admit to inpatient   PATIENT DISPOSITION:  PACU - hemodynamically stable.  INDICATION:    72 y.o. F with acending colon mass.  I recommended segmental resection:  The anatomy & physiology of the digestive tract was discussed.  The pathophysiology was discussed.  Natural history risks without surgery was discussed.   I worked to give an overview of the disease and the frequent need to have multispecialty involvement.  I feel the risks of no intervention will lead to serious problems that outweigh the operative risks; therefore, I recommended a partial colectomy to remove the pathology.  Laparoscopic & open techniques were discussed.   Risks such as bleeding, infection, abscess, leak, reoperation, possible ostomy, hernia, heart  attack, death, and other risks were discussed.  I noted a good likelihood this will help address the problem.   Goals of post-operative recovery were discussed as well.    The patient expressed understanding & wished to proceed with surgery.  OR FINDINGS:   Patient had tattoo noted in her ascending colon and proximal transverse colon.  Omental abd wall adhesions and mild pelvic adhesions.  No obvious metastatic disease on visceral parietal peritoneum or liver.  DESCRIPTION:   Informed consent was confirmed.  The patient underwent general anaesthesia without difficulty.  The patient was positioned appropriately.  VTE prevention in place.  The patient's abdomen was clipped, prepped, & draped in a sterile fashion.  Surgical timeout confirmed our plan.  The patient was positioned in reverse Trendelenburg.  Abdominal entry was gained using a Varies needle in the LUQ.  Entry was clean.  I induced carbon dioxide insufflation.  An 8mm robotic port was placed in the LUQ.  Camera inspection revealed no injury.  Extra ports were carefully placed under direct laparoscopic visualization.  I laparoscopically reflected the greater omentum and the upper abdomen the small bowel in the peilvis. The patient was appropriately positioned and the robot was docked to the patient's right side.  Instruments were placed under direct visualization. I took down the omental adhesions bluntly.    I began by identifying the ileocolic artery and vein within the mesentery. Dissection was bluntly carried around these structures. The duodenum was identified and free from the structures. I then separated the structures bluntly and used the robotic vessel sealer device to transect these.  I developed the retroperitoneal plane bluntly.  I then freed  the appendix off its attachments to the pelvic wall. I mobilized the terminal ileum.  I took care to avoid injuring any retroperitoneal structures.  After this I began to mobilize laterally  down the white line of Toldt and then took down the hepatic flexure using the Enseal device. I mobilized the omentum off of the right transverse colon. The entire colon was then flipped medially and mobilized off of the retroperitoneal structures until I could visualize the lateral edge of the duodenum underneath.  I gently freed the duodenal attachments.   I identified a portion of mesentery of the transverse colon just proximal to the right branch of the middle colic.  I divided up to the colon from the previous dissection of the mesentery using the robotic vessel sealer.  I then divided the terminal ileal mesentery in similar fashion.  At that point, the terminal ileum was divided with a blue load robotic 60 mm stapler.  The transverse colon was divided with a green load robotic stapler.  The specimen was then completely free and placed in the right upper quadrant.  Hemostasis was good.  I then oriented the remaining terminal ileum and transverse colon and a isoperistaltic fashion.  The terminal ileum would not reach due to adhesions in the pelvis.  She was placed in trendelenburg and the adhesions were lysed sharply.  This allowed for good mobilization of the remaining small bowel.  I placed an enterotomy in the small bowel and colon using the robotic scissors.  I then introduced a white load 60 mm robotic stapler into both enterotomies and created an anastomosis between the small bowel and transverse colon.  Hemostasis within the staple line was good.  The common enterotomy channel was closed using 2 running 2-0 V-Loc sutures. The omentum was then brought down over the anastomosis.  At this point the robot was undocked.  The 12 mm suprapubic port was enlarged to a Pfannenstiel incision and an Alexis wound protector was placed.  The specimen was removed from the abdomen and evaluated.  Once the abdomen was inspected for hemostasis, the Alexis wound protector was removed.    The peritoneum of the Pfannenstiel  incision was closed using a running 0 Vicryl suture.  The fascia was then closed using #1 Novafil interrupted sutures.  The subcutaneous tissue of the extraction incision was closed using a running 2-0 Vicryl suture. The skin was then closed using running subcuticular 4-0 Vicryl sutures.  A sterile dressing was applied.  The remaining port sites were closed using interrupted 4-0 Vicryl sutures and Dermabond. All counts were correct per operating room staff. The patient was then awakened from anesthesia and sent to the post anesthesia care unit in stable condition.     Vanita Panda, MD  Colorectal and General Surgery [facility] [place] [facility]

## 2023-06-08 NOTE — H&P (Signed)
REFERRING PHYSICIAN:  Marcine Matar, MD   PROVIDER:  Elenora Gamma, MD   MRN: [id number] DOB: June 27, 1951    Subjective   Chief Complaint: New Consultation (Colonic mass)       History of Present Illness: [name] is a 71 y.o. female who is seen today as an office consultation at the request of Dr. Laural Benes for evaluation of New Consultation (Colonic mass) .  Patient underwent high risk cancer surveillance with colonoscopy due to history of colon polyps.  This was completed in August 2024.  She was found to have several polyps in the cecum which were removed there was a tumor in the ascending colon which was biopsied and tattooed.  A polyp was removed in transverse colon.  Biopsy of the mass showed tubulovillous adenoma which was negative for high-grade dysplasia.  CT abdomen showed a possible right colon mass with no evidence of metastatic disease.  Patient reports regular bowel habits and abdominal surgery history significant for hysterectomy.     Review of Systems: A complete review of systems was obtained from the patient.  I have reviewed this information and discussed as appropriate with the patient.  See HPI as well for other ROS.     Medical History: Past Medical History Past Medical History: DiagnosisDate            Anemia                         Arthritis                         Chronic kidney disease                       COPD (chronic obstructive pulmonary disease) (CMS/HHS-HCC)                GERD (gastroesophageal reflux disease)                   Heart valve disease                 History of cancer                      History of stroke                       Hyperlipidemia                         Hypertension             Problem List There is no problem list on file for this patient.     Past Surgical History History reviewed. No pertinent surgical history.     Allergies No Known Allergies     Medications Ordered Prior to  Encounter Current Outpatient Medications on File Prior to Visit MedicationSigDispenseRefill            albuterol MDI, PROVENTIL, VENTOLIN, PROAIR, HFA 90 mcg/actuation inhaler Inhale 2 Inhalations into the lungs every 6 (six) hours as needed                           amLODIPine (NORVASC) 10 MG tablet        Take 1 tablet by mouth once daily  atorvastatin (LIPITOR) 80 MG tablet  TAKE 1 TABLET EVERY DAY (DOSE INCREASE)                            buPROPion (WELLBUTRIN XL) 150 MG XL tablet   Take 1 tablet by mouth 2 (two) times daily                                  carvediloL (COREG) 25 MG tablet     Take 1 tablet by mouth 2 (two) times daily                              celecoxib (CELEBREX) 50 MG capsule        Take by mouth                                     diclofenac (VOLTAREN) 1 % topical gel        APPLY 2 GRAMS TOPICALLY 4 (FOUR) TIMES DAILY AS NEEDED.                                    DULoxetine (CYMBALTA) 30 MG DR capsule          Take 30 mg by mouth once daily                             FARXIGA 10 mg tablet           Take 10 mg by mouth once daily                                gabapentin (NEURONTIN) 300 MG capsule Take 1 capsule by mouth 3 (three) times daily                               hydrALAZINE (APRESOLINE) 100 MG tablet           Take 1 tablet by mouth 2 (two) times daily                                  insulin ASPART (NOVOLOG FLEXPEN) pen injector (concentration 100 units/mL)        Inj 4 units subcut with the 2 larges meals of the day                          insulin GLARGINE (LANTUS SOLOSTAR) pen injector (concentration 100 units/mL)        Inject subcutaneously                          letrozole (FEMARA) 2.5 mg tablet      Take 1 tablet by mouth once daily                               methocarbamoL (ROBAXIN) 500 MG tablet  TAKE  1 TABLET EVERY 8 HOURS AS NEEDED FOR MUSCLE SPASMS.                                 traMADoL (ULTRAM) 50 mg tablet     Take by mouth                                     traZODone (DESYREL) 50 MG tablet            Take 50 mg by mouth at bedtime as needed                                    valsartan-hydroCHLOROthiazide (DIOVAN-HCT) 160-25 mg tablet            Take 1 tablet by mouth once daily                       No current facility-administered medications on file prior to visit.       Family History Family History ProblemRelationAge of Onset            Colon cancer   Mother              Colon cancer   Father               Stroke  Sister                High blood pressure (Hypertension)   Sister                Diabetes          Sister                Deep vein thrombosis (DVT or abnormal blood clot formation)        Sister           Tobacco Use History Social History     Tobacco Use Smoking StatusFormer Types:Cigarettes Smokeless TobaccoNot on file       Social History Social History     Socioeconomic History Marital status:Married Tobacco Use Smoking status:Former                         Types: Cigarettes Vaping Use Vaping status:Unknown Substance and Sexual Activity Alcohol use:Not Currently Drug WUJ:WJXBJ     Social Drivers of Health     Financial Resource Strain: Low Risk  (01/24/2023)             Received from [facility]             Overall Financial Resource Strain (CARDIA)                        Difficulty of Paying Living Expenses: Not hard at all Food Insecurity: No Food Insecurity (01/24/2023)             Received from [facility]             Hunger Vital Sign                        Worried About Running Out of Food in the Last Year: Never true  Ran Out of Food in the Last Year: Never true Transportation Needs: No Transportation Needs (01/24/2023)             Received from [facility] - Transportation                        Lack of Transportation (Medical): No                        Lack of Transportation (Non-Medical):  No Physical Activity: Insufficiently Active (01/24/2023)             Received from [facility]             Exercise Vital Sign                        Days of Exercise per Week: 3 days                        Minutes of Exercise per Session: 30 min Stress: No Stress Concern Present (01/24/2023)             Received from [facility] of Occupational [facility] - Occupational Stress Questionnaire                        Feeling of Stress : Not at all Social Connections: Moderately Integrated (01/24/2023)             Received from [facility]             Social Connection and Isolation Panel [NHANES]                        Frequency of Communication with Friends and Family: More than three times a week                        Frequency of Social Gatherings with Friends and Family: Three times a week                        Attends Religious Services: More than 4 times per year                        Active Member of Clubs or Organizations: No                        Attends Banker Meetings: Never                        Marital Status: Married       Objective:   Vitals:   06/08/23 0635  BP: (!) 140/60  Pulse: 70  Resp: 16  Temp: 98.3 F (36.8 C)  SpO2: 96%      Exam Gen: NAD CV: RRR Pulm: CTA Abd: soft     Labs, Imaging and Diagnostic Testing: Colonoscopy report reviewed.  Mass does seem malignant appearing.  Area distal to the mass was tattooed.   Assessment and Plan: Diagnoses and all orders for this visit:   Colonic mass     Patient has a malignant appearing polyp in  her hepatic flexure.  Biopsies have only shown high-grade dysplasia so far but there is concern for underlying malignancy.  We discussed proceeding with a robotic assisted partial colectomy.  We discussed that most likely this would be a right colectomy.  All questions were answered.  Patient is scheduled for a outpatient orthopedic surgery in 2 weeks.  I believe she could  undergo colon surgery while she is recovering from this.   The surgery and anatomy were described to the patient as well as the risks of surgery and the possible complications.  These include: Bleeding, deep abdominal infections and possible wound complications such as hernia and infection, damage to adjacent structures, leak of surgical connections, which can lead to other surgeries and possibly an ostomy, possible need for other procedures, such as abscess drains in radiology, possible prolonged hospital stay, possible diarrhea from removal of part of the colon, possible constipation from narcotics, possible bowel, bladder or sexual dysfunction if having rectal surgery, prolonged fatigue/weakness or appetite loss, possible early recurrence of of disease, possible complications of their medical problems such as heart disease or arrhythmias or lung problems, death (less than 1%). I believe the patient understands and wishes to proceed with the surgery.    Vanita Panda, MD Colon and Rectal Surgery [facility]

## 2023-06-08 NOTE — Transfer of Care (Signed)
Immediate Anesthesia Transfer of Care Note  Patient: [name]  Procedure(s) Performed: XI ROBOT ASSISTED LAPAROSCOPIC PARTIAL COLECTOMY  Patient Location: PACU  Anesthesia Type:General  Level of Consciousness: awake and drowsy  Airway & Oxygen Therapy: Patient Spontanous Breathing and Patient connected to face mask oxygen  Post-op Assessment: Report given to RN and Post -op Vital signs reviewed and stable  Post vital signs: Reviewed and stable  Last Vitals:  Vitals Value Taken Time  BP 136/57 06/08/23 1038  Temp    Pulse 58 06/08/23 1039  Resp 22 06/08/23 1039  SpO2 100 % 06/08/23 1039  Vitals shown include unfiled device data.  Last Pain:  Vitals:   06/08/23 0635  TempSrc: Oral  PainSc:          Complications: No notable events documented.

## 2023-06-09 LAB — GLUCOSE, CAPILLARY
Glucose-Capillary: 135 mg/dL — ABNORMAL HIGH (ref 70–99)
Glucose-Capillary: 197 mg/dL — ABNORMAL HIGH (ref 70–99)
Glucose-Capillary: 209 mg/dL — ABNORMAL HIGH (ref 70–99)
Glucose-Capillary: 183 mg/dL — ABNORMAL HIGH (ref 70–99)

## 2023-06-09 LAB — BASIC METABOLIC PANEL
Anion gap: 8 (ref 5–15)
BUN: 32 mg/dL — ABNORMAL HIGH (ref 8–23)
CO2: 17 mmol/L — ABNORMAL LOW (ref 22–32)
Calcium: 8.4 mg/dL — ABNORMAL LOW (ref 8.9–10.3)
Chloride: 106 mmol/L (ref 98–111)
Creatinine, Ser: 2.28 mg/dL — ABNORMAL HIGH (ref 0.44–1.00)
GFR, Estimated: 22 mL/min — ABNORMAL LOW (ref >60)
Glucose, Bld: 183 mg/dL — ABNORMAL HIGH (ref 70–99)
Potassium: 5.5 mmol/L — ABNORMAL HIGH (ref 3.5–5.1)
Sodium: 131 mmol/L — ABNORMAL LOW (ref 135–145)

## 2023-06-09 LAB — CBC
HCT: 31.8 % — ABNORMAL LOW (ref 36.0–46.0)
Hemoglobin: 9.9 g/dL — ABNORMAL LOW (ref 12.0–15.0)
MCH: 25.4 pg — ABNORMAL LOW (ref 26.0–34.0)
MCHC: 31.1 g/dL (ref 30.0–36.0)
MCV: 81.7 fL (ref 80.0–100.0)
Platelets: 203 10*3/uL (ref 150–400)
RBC: 3.89 MIL/uL (ref 3.87–5.11)
RDW: 16.4 % — ABNORMAL HIGH (ref 11.5–15.5)
WBC: 8 10*3/uL (ref 4.0–10.5)
nRBC: 0 % (ref 0.0–0.2)

## 2023-06-09 MED ORDER — DEXTROSE-SODIUM CHLORIDE 5-0.45 % IV SOLN: INTRAVENOUS | Status: DC

## 2023-06-09 NOTE — Progress Notes (Addendum)
1 Day Post-Op Robotic R colectomy Subjective: Feels sore, no nausea, no BM yet, tolerating fulls, worried about mobilizing due to joint pain  Objective: Vital signs in last 24 hours: Temp:  [97.4 F (36.3 C)-99.2 F (37.3 C)] 98.8 F (37.1 C) (12/13 0553) Pulse Rate:  [55-83] 73 (12/13 0553) Resp:  [14-21] 15 (12/13 0553) BP: (115-163)/(49-70) 115/49 (12/13 0553) SpO2:  [92 %-100 %] 97 % (12/13 0553)   Intake/Output from previous day: 12/12 0701 - 12/13 0700 In: 2600.8 [P.O.:1530; I.V.:870.8; IV Piggyback:200] Out: 1525 [Urine:1475; Blood:50] Intake/Output this shift: Total I/O In: 243.3 [P.O.:240; I.V.:3.3] Out: -    General appearance: alert and cooperative GI: normal findings: soft, non-distended, appropriately TTP  Incision: no significant drainage  Lab Results:  Recent Labs    06/09/23 0432  WBC 8.0  HGB 9.9*  HCT 31.8*  PLT 203   BMET Recent Labs    06/09/23 0432  NA 131*  K 5.5*  CL 106  CO2 17*  GLUCOSE 183*  BUN 32*  CREATININE 2.28*  CALCIUM 8.4*   PT/INR No results for input(s): "LABPROT", "INR" in the last 72 hours. ABG No results for input(s): "PHART", "HCO3" in the last 72 hours.  Invalid input(s): "PCO2", "PO2"  MEDS, Scheduled  alvimopan  12 mg Oral BID   amLODipine  10 mg Oral Daily   atorvastatin  80 mg Oral Daily   buPROPion  150 mg Oral BID   carvedilol  25 mg Oral BID   DULoxetine  30 mg Oral Daily   enoxaparin (LOVENOX) injection  40 mg Subcutaneous Q24H   feeding supplement  237 mL Oral BID BM   furosemide  20 mg Oral Daily   gabapentin  300 mg Oral BID   hydrALAZINE  100 mg Oral BID   irbesartan  150 mg Oral Daily   And   hydrochlorothiazide  25 mg Oral Daily   insulin aspart  0-20 Units Subcutaneous TID WC   insulin glargine-yfgn  10 Units Subcutaneous QHS   pantoprazole  40 mg Oral Daily   saccharomyces boulardii  250 mg Oral BID    Studies/Results: No results found.  Assessment: s/p Procedure(s): XI ROBOT  ASSISTED LAPAROSCOPIC PARTIAL COLECTOMY Patient Active Problem List   Diagnosis Date Noted   Colonic mass 06/08/2023   Chronic right shoulder pain 03/28/2023   Neck pain 03/28/2023   Primary osteoarthritis of right shoulder 08/10/2022   CAP (community acquired pneumonia) 06/22/2022   Stage 3b chronic kidney disease (CKD) (HCC) 06/22/2022   Trigger finger, left index finger 12/02/2021   DM type 2 with diabetic peripheral neuropathy (HCC) 07/26/2021   Bilateral low back pain with bilateral sciatica 07/26/2021   Gait abnormality 07/26/2021   Controlled substance agreement signed 12/31/2020   Shoulder stiffness, left 07/07/2020   Status post lumbar spine surgery for decompression of spinal cord 06/09/2020   Lumbar stenosis 05/27/2020   Spinal stenosis of lumbar region 05/21/2019   Complete tear of left rotator cuff 05/16/2019   Uncontrolled type 2 diabetes mellitus with hyperglycemia (HCC) 12/19/2018   Microalbuminuria 04/08/2018   Genetic testing 09/25/2017   Family history of breast cancer    Family history of prostate cancer    Malignant neoplasm of upper-outer quadrant of right breast in female, estrogen receptor positive (HCC) 06/26/2017   Lumbar herniated disc 04/15/2015   Abnormal nuclear stress test 04/03/2015   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   CAD (coronary artery disease), native coronary artery 03/30/2015   S/P mitral  valve repair 11/16/2010   Pure hypercholesterolemia 06/29/2010   Diabetes (HCC) 09/29/2006   ANEMIA-IRON DEFICIENCY 09/29/2006   Essential hypertension 09/29/2006    Expected post op course  Plan: d/c foley Advance diet as tolerated Cont IVF's until Cr returns to baseline PT eval for assistance getting up from sitting   LOS: 1 day     .Vanita Panda, MD [facility], [place]    06/09/2023 9:26 AM

## 2023-06-09 NOTE — Discharge Instructions (Signed)
SURGERY: POST OP INSTRUCTIONS (Surgery for small bowel obstruction, colon resection, etc)   ######################################################################  EAT Gradually transition to a high fiber diet with a fiber supplement over the next few days after discharge  WALK Walk an hour a day.  Control your pain to do that.    CONTROL PAIN Control pain so that you can walk, sleep, tolerate sneezing/coughing, go up/down stairs.  HAVE A BOWEL MOVEMENT DAILY Keep your bowels regular to avoid problems.  OK to try a laxative to override constipation.  OK to use an antidairrheal to slow down diarrhea.  Call if not better after 2 tries  CALL IF YOU HAVE PROBLEMS/CONCERNS Call if you are still struggling despite following these instructions. Call if you have concerns not answered by these instructions  ######################################################################   DIET Follow a light diet the first few days at home.  Start with a bland diet such as soups, liquids, starchy foods, low fat foods, etc.  If you feel full, bloated, or constipated, stay on a ful liquid or pureed/blenderized diet for a few days until you feel better and no longer constipated. Be sure to drink plenty of fluids every day to avoid getting dehydrated (feeling dizzy, not urinating, etc.). Gradually add a fiber supplement to your diet over the next week.  Gradually get back to a regular solid diet.  Avoid fast food or heavy meals the first week as you are more likely to get nauseated. It is expected for your digestive tract to need a few months to get back to normal.  It is common for your bowel movements and stools to be irregular.  You will have occasional bloating and cramping that should eventually fade away.  Until you are eating solid food normally, off all pain medications, and back to regular activities; your bowels will not be normal. Focus on eating a low-fat, high fiber diet the rest of your life  (See Getting to Good Bowel Health, below).  CARE of your INCISION or WOUND  It is good for closed incisions and even open wounds to be washed every day.  Shower every day.  Short baths are fine.  Wash the incisions and wounds clean with soap & water.    You may leave closed incisions open to air if it is dry.   You may cover the incision with clean gauze & replace it after your daily shower for comfort.  STAPLES: You have skin staples.  Leave them in place & set up an appointment for them to be removed by a surgery office nurse ~10 days after surgery. = 1st week of January 2024    ACTIVITIES as tolerated Start light daily activities --- self-care, walking, climbing stairs-- beginning the day after surgery.  Gradually increase activities as tolerated.  Control your pain to be active.  Stop when you are tired.  Ideally, walk several times a day, eventually an hour a day.   Most people are back to most day-to-day activities in a few weeks.  It takes 4-8 weeks to get back to unrestricted, intense activity. If you can walk 30 minutes without difficulty, it is safe to try more intense activity such as jogging, treadmill, bicycling, low-impact aerobics, swimming, etc. Save the most intensive and strenuous activity for last (Usually 4-8 weeks after surgery) such as sit-ups, heavy lifting, contact sports, etc.  Refrain from any intense heavy lifting or straining until you are off narcotics for pain control.  You will have off days, but things should improve   week-by-week. DO NOT PUSH THROUGH PAIN.  Let pain be your guide: If it hurts to do something, don't do it.  Pain is your body warning you to avoid that activity for another week until the pain goes down. You may drive when you are no longer taking narcotic prescription pain medication, you can comfortably wear a seatbelt, and you can safely make sudden turns/stops to protect yourself without hesitating due to pain. You may have sexual intercourse when it  is comfortable. If it hurts to do something, stop.  MEDICATIONS Take your usually prescribed home medications unless otherwise directed.   Blood thinners:  Usually you can restart any strong blood thinners after the second postoperative day.  It is OK to take aspirin right away.     If you are on strong blood thinners (warfarin/Coumadin, Plavix, Xerelto, Eliquis, Pradaxa, etc), discuss with your surgeon, medicine PCP, and/or cardiologist for instructions on when to restart the blood thinner & if blood monitoring is needed (PT/INR blood check, etc).     PAIN CONTROL Pain after surgery or related to activity is often due to strain/injury to muscle, tendon, nerves and/or incisions.  This pain is usually short-term and will improve in a few months.  To help speed the process of healing and to get back to regular activity more quickly, DO THE FOLLOWING THINGS TOGETHER: Increase activity gradually.  DO NOT PUSH THROUGH PAIN Use Ice and/or Heat Try Gentle Massage and/or Stretching Take over the counter pain medication Take Narcotic prescription pain medication for more severe pain  Good pain control = faster recovery.  It is better to take more medicine to be more active than to stay in bed all day to avoid medications.  Increase activity gradually Avoid heavy lifting at first, then increase to lifting as tolerated over the next 6 weeks. Do not "push through" the pain.  Listen to your body and avoid positions and maneuvers than reproduce the pain.  Wait a few days before trying something more intense Walking an hour a day is encouraged to help your body recover faster and more safely.  Start slowly and stop when getting sore.  If you can walk 30 minutes without stopping or pain, you can try more intense activity (running, jogging, aerobics, cycling, swimming, treadmill, sex, sports, weightlifting, etc.) Remember: If it hurts to do it, then don't do it! Use Ice and/or Heat You will have swelling and  bruising around the incisions.  This will take several weeks to resolve. Ice packs or heating pads (6-8 times a day, 30-60 minutes at a time) will help sooth soreness & bruising. Some people prefer to use ice alone, heat alone, or alternate between ice & heat.  Experiment and see what works best for you.  Consider trying ice for the first few days to help decrease swelling and bruising; then, switch to heat to help relax sore spots and speed recovery. Shower every day.  Short baths are fine.  It feels good!  Keep the incisions and wounds clean with soap & water.   Try Gentle Massage and/or Stretching Massage at the area of pain many times a day Stop if you feel pain - do not overdo it Take over the counter pain medication This helps the muscle and nerve tissues become less irritable and calm down faster Choose ONE of the following over-the-counter anti-inflammatory medications: Acetaminophen 500mg tabs (Tylenol) 1-2 pills with every meal and just before bedtime (avoid if you have liver problems or if you have   acetaminophen in you narcotic prescription) Naproxen 220mg tabs (ex. Aleve, Naprosyn) 1-2 pills twice a day (avoid if you have kidney, stomach, IBD, or bleeding problems) Ibuprofen 200mg tabs (ex. Advil, Motrin) 3-4 pills with every meal and just before bedtime (avoid if you have kidney, stomach, IBD, or bleeding problems) Take with food/snack several times a day as directed for at least 2 weeks to help keep pain / soreness down & more manageable. Take Narcotic prescription pain medication for more severe pain A prescription for strong pain control is often given to you upon discharge (for example: oxycodone/Percocet, hydrocodone/Norco/Vicodin, or tramadol/Ultram) Take your pain medication as prescribed. Be mindful that most narcotic prescriptions contain Tylenol (acetaminophen) as well - avoid taking too much Tylenol. If you are having problems/concerns with the prescription medicine (does  not control pain, nausea, vomiting, rash, itching, etc.), please call us [phone] to see if we need to switch you to a different pain medicine that will work better for you and/or control your side effects better. If you need a refill on your pain medication, you must call the office before 4 pm and on weekdays only.  By federal law, prescriptions for narcotics cannot be called into a pharmacy.  They must be filled out on paper & picked up from our office by the patient or authorized caretaker.  Prescriptions cannot be filled after 4 pm nor on weekends.    WHEN TO CALL US [phone] Severe uncontrolled or worsening pain  Fever over 101 F (38.5 C) Concerns with the incision: Worsening pain, redness, rash/hives, swelling, bleeding, or drainage Reactions / problems with new medications (itching, rash, hives, nausea, etc.) Nausea and/or vomiting Difficulty urinating Difficulty breathing Worsening fatigue, dizziness, lightheadedness, blurred vision Other concerns If you are not getting better after two weeks or are noticing you are getting worse, contact our office [phone] for further advice.  We may need to adjust your medications, re-evaluate you in the office, send you to the emergency room, or see what other things we can do to help. The clinic staff is available to answer your questions during regular business hours (8:30am-5pm).  Please don't hesitate to call and ask to speak to one of our nurses for clinical concerns.    A surgeon from [facility] is always on call at the hospitals 24 hours/day If you have a medical emergency, go to the nearest emergency room or call 911.  FOLLOW UP in our office One the day of your discharge from the hospital (or the next business weekday), please call [facility] to set up or confirm an appointment to see your surgeon in the office for a follow-up appointment.  Usually it is 2-3 weeks after your surgery.   If you  have skin staples at your incision(s), let the office know so we can set up a time in the office for the nurse to remove them (usually around 10 days after surgery). Make sure that you call for appointments the day of discharge (or the next business weekday) from the hospital to ensure a convenient appointment time. IF YOU HAVE DISABILITY OR FAMILY LEAVE FORMS, BRING THEM TO THE OFFICE FOR PROCESSING.  DO NOT GIVE THEM TO YOUR DOCTOR.  [facility] [address], [address], [place], [place]  [address] ? [phone] - Main [phone] - Toll Free,  [phone] - Fax [email or web address]    GETTING TO GOOD BOWEL HEALTH. It is expected for your digestive tract to   need a few months to get back to normal.  It is common for your bowel movements and stools to be irregular.  You will have occasional bloating and cramping that should eventually fade away.  Until you are eating solid food normally, off all pain medications, and back to regular activities; your bowels will not be normal.   Avoiding constipation The goal: ONE SOFT BOWEL MOVEMENT A DAY!    Drink plenty of fluids.  Choose water first. TAKE A FIBER SUPPLEMENT EVERY DAY THE REST OF YOUR LIFE During your first week back home, gradually add back a fiber supplement every day Experiment which form you can tolerate.   There are many forms such as powders, tablets, wafers, gummies, etc Psyllium bran (Metamucil), methylcellulose (Citrucel), Miralax or Glycolax, Benefiber, Flax Seed.  Adjust the dose week-by-week (1/2 dose/day to 6 doses a day) until you are moving your bowels 1-2 times a day.  Cut back the dose or try a different fiber product if it is giving you problems such as diarrhea or bloating. Sometimes a laxative is needed to help jump-start bowels if constipated until the fiber supplement can help regulate your bowels.  If you are tolerating eating & you are farting, it is okay to try a gentle  laxative such as double dose MiraLax, prune juice, or Milk of Magnesia.  Avoid using laxatives too often. Stool softeners can sometimes help counteract the constipating effects of narcotic pain medicines.  It can also cause diarrhea, so avoid using for too long. If you are still constipated despite taking fiber daily, eating solids, and a few doses of laxatives, call our office. Controlling diarrhea Try drinking liquids and eating bland foods for a few days to avoid stressing your intestines further. Avoid dairy products (especially milk & ice cream) for a short time.  The intestines often can lose the ability to digest lactose when stressed. Avoid foods that cause gassiness or bloating.  Typical foods include beans and other legumes, cabbage, broccoli, and dairy foods.  Avoid greasy, spicy, fast foods.  Every person has some sensitivity to other foods, so listen to your body and avoid those foods that trigger problems for you. Probiotics (such as active yogurt, Align, etc) may help repopulate the intestines and colon with normal bacteria and calm down a sensitive digestive tract Adding a fiber supplement gradually can help thicken stools by absorbing excess fluid and retrain the intestines to act more normally.  Slowly increase the dose over a few weeks.  Too much fiber too soon can backfire and cause cramping & bloating. It is okay to try and slow down diarrhea with a few doses of antidiarrheal medicines.   Bismuth subsalicylate (ex. Kayopectate, Pepto Bismol) for a few doses can help control diarrhea.  Avoid if pregnant.   Loperamide (Imodium) can slow down diarrhea.  Start with one tablet (2mg) first.  Avoid if you are having fevers or severe pain.  ILEOSTOMY PATIENTS WILL HAVE CHRONIC DIARRHEA since their colon is not in use.    Drink plenty of liquids.  You will need to drink even more glasses of water/liquid a day to avoid getting dehydrated. Record output from your ileostomy.  Expect to empty  the bag every 3-4 hours at first.  Most people with a permanent ileostomy empty their bag 4-6 times at the least.   Use antidiarrheal medicine (especially Imodium) several times a day to avoid getting dehydrated.  Start with a dose at bedtime & breakfast.  Adjust up or   down as needed.  Increase antidiarrheal medications as directed to avoid emptying the bag more than 8 times a day (every 3 hours). Work with your wound ostomy nurse to learn care for your ostomy.  See ostomy care instructions. TROUBLESHOOTING IRREGULAR BOWELS 1) Start with a soft & bland diet. No spicy, greasy, or fried foods.  2) Avoid gluten/wheat or dairy products from diet to see if symptoms improve. 3) Miralax 17gm or flax seed mixed in 8oz. water or juice-daily. May use 2-4 times a day as needed. 4) Gas-X, Phazyme, etc. as needed for gas & bloating.  5) Prilosec (omeprazole) over-the-counter as needed 6)  Consider probiotics (Align, Activa, etc) to help calm the bowels down  Call your doctor if you are getting worse or not getting better.  Sometimes further testing (cultures, endoscopy, X-ray studies, CT scans, bloodwork, etc.) may be needed to help diagnose and treat the cause of the diarrhea. [facility] [address], [address], [place], [place]  [address] [phone] - Main.    [phone]  - Toll Free.   [phone] - Fax [email or web address]   ###############################   #######################################################  Ostomy Support Information  You've heard that people get along just fine with only one of their eyes, or one of their lungs, or one of their kidneys. But you also know that you have only one intestine and only one bladder, and that leaves you feeling awfully empty, both physically and emotionally: You think no other people go around without part of their intestine with the ends of their intestines sticking out through their abdominal walls.    YOU ARE NOT ALONE.  There are nearly three quarters of a million people in the [place] who have an ostomy; people who have had surgery to remove all or part of their colons or bladders.   There is even a national association, the [employer] with over 350 local affiliated support groups that are organized by volunteers who provide peer support and counseling. [employer] has a toll free telephone num-ber, 800-826-0826 and an educational, interactive website, www.ostomy.org   An ostomy is an opening in the belly (abdominal wall) made by surgery. Ostomates are people who have had this procedure. The opening (stoma) allows the kidney or bowel to grdischarge waste. An external pouch covers the stoma to collect waste. Pouches are are a simple bag and are odor free. Different companies have disposable or reusable pouches to fit one's lifestyle. An ostomy can either be temporary or permanent.   THERE ARE THREE MAIN TYPES OF OSTOMIES Colostomy. A colostomy is a surgically created opening in the large intestine (colon). Ileostomy. An ileostomy is a surgically created opening in the small intestine. Urostomy. A urostomy is a surgically created opening to divert urine away from the bladder.  OSTOMY Care  The following guidelines will make care of your colostomy easier. Keep this information close by for quick reference.  Helpful DIET hints Eat a well-balanced diet including vegetables and fresh fruits. Eat on a regular schedule.  Drink at least 6 to 8 glasses of fluids daily. Eat slowly in a relaxed atmosphere. Chew your food thoroughly. Avoid chewing gum, smoking, and drinking from a straw. This will help decrease the amount of air you swallow, which may help reduce gas. Eating yogurt or drinking buttermilk may help reduce gas.  To control gas at night, do not eat after 8 p.m. This will give your bowel time to quiet down before you go   to bed.  If gas is a problem, you can purchase  Beano. Sprinkle Beano on the first bite of food before eating to reduce gas. It has no flavor and should not change the taste of your food. You can buy Beano over the counter at your local drugstore.  Foods like fish, onions, garlic, broccoli, asparagus, and cabbage produce odor. Although your pouch is odor-proof, if you eat these foods you may notice a stronger odor when emptying your pouch. If this is a concern, you may want to limit these foods in your diet.  If you have an ileostomy, you will have chronic diarrhea & need to drink more liquids to avoid getting dehydrated.  Consider antidiarrheal medicine like imodium (loperamide) or Lomotil to help slow down bowel movements / diarrhea into your ileostomy bag.  GETTING TO GOOD BOWEL HEALTH WITH AN ILEOSTOMY    With the colon bypassed & not in use, you will have small bowel diarrhea.   It is important to thicken & slow your bowel movements down.   The goal: 4-6 small BOWEL MOVEMENTS A DAY It is important to drink plenty of liquids to avoid getting dehydrated  CONTROLLING ILEOSTOMY DIARRHEA  TAKE A FIBER SUPPLEMENT (FiberCon or Benefiner soluble fiber) twice a day - to thicken stools by absorbing excess fluid and retrain the intestines to act more normally.  Slowly increase the dose over a few weeks.  Too much fiber too soon can backfire and cause cramping & bloating.  TAKE AN IRON SUPPLEMENT twice a day to naturally constipate your bowels.  Usually ferrous sulfate 325mg twice a day)  TAKE ANTI-DIARRHEAL MEDICINES: Loperamide (Imodium) can slow down diarrhea.  Start with two tablets (= 4mg) first and then try one tablet every 6 hours.  Can go up to 2 pills four times day (8 pills of 2mg max) Avoid if you are having fevers or severe pain.  If you are not better or start feeling worse, stop all medicines and call your doctor for advice LoMotil (Diphenoxylate / Atropine) is another medicine that can constipate & slow down bowel moevements Pepto  Bismol (bismuth) can gently thicken bowels as well  If diarrhea is worse,: drink plenty of liquids and try simpler foods for a few days to avoid stressing your intestines further. Avoid dairy products (especially milk & ice cream) for a short time.  The intestines often can lose the ability to digest lactose when stressed. Avoid foods that cause gassiness or bloating.  Typical foods include beans and other legumes, cabbage, broccoli, and dairy foods.  Every person has some sensitivity to other foods, so listen to our body and avoid those foods that trigger problems for you.Call your doctor if you are getting worse or not better.  Sometimes further testing (cultures, endoscopy, X-ray studies, bloodwork, etc) may be needed to help diagnose and treat the cause of the diarrhea. Take extra anti-diarrheal medicines (maximum is 8 pills of 2mg loperamide a day)   Tips for POUCHING an OSTOMY   Changing Your Pouch The best time to change your pouch is in the morning, before eating or drinking anything. Your stoma can function at any time, but it will function more after eating or drinking.   Applying the pouching system  Place all your equipment close at hand before removing your pouch.  Wash your hands.  Stand or sit in front of a mirror. Use the position that works best for you. Remember that you must keep the skin around the stoma   wrinkle-free for a good seal.  Gently remove the used pouch (1-piece system) or the pouch and old wafer (2-piece system). Empty the pouch into the toilet. Save the closure clip to use again.  Wash the stoma itself and the skin around the stoma. Your stoma may bleed a little when being washed. This is normal. Rinse and pat dry. You may use a wash cloth or soft paper towels (like Bounty), mild soap (like Dial, Safeguard, or Ivory), and water. Avoid soaps that contain perfumes or lotions.  For a new pouch (1-piece system) or a new wafer (2-piece system), measure your  stoma using the stoma guide in each box of supplies.  Trace the shape of your stoma onto the back of the new pouch or the back of the new wafer. Cut out the opening. Remove the paper backing and set it aside.  Optional: Apply a skin barrier powder to surrounding skin if it is irritated (bare or weeping), and dust off the excess. Optional: Apply a skin-prep wipe (such as Skin Prep or All-Kare) to the skin around the stoma, and let it dry. Do not apply this solution if the skin is irritated (red, tender, or broken) or if you have shaved around the stoma. Optional: Apply a skin barrier paste (such as Stomahesive, Coloplast, or Premium) around the opening cut in the back of the pouch or wafer. Allow it to dry for 30 to 60 seconds.  Hold the pouch (1-piece system) or wafer (2-piece system) with the sticky side toward your body. Make sure the skin around the stoma is wrinkle-free. Center the opening on the stoma, then press firmly to your abdomen (Fig. 4). Look in the mirror to check if you are placing the pouch, or wafer, in the right position. For a 2-piece system, snap the pouch onto the wafer. Make sure it snaps into place securely.  Place your hand over the stoma and the pouch or wafer for about 30 seconds. The heat from your hand can help the pouch or wafer stick to your skin.  Add deodorant (such as Super Banish or Nullo) to your pouch. Other options include food extracts such as vanilla oil and peppermint extract. Add about 10 drops of the deodorant to the pouch. Then apply the closure clamp. Note: Do not use toxic  chemicals or commercial cleaning agents in your pouch. These substances may harm the stoma.  Optional: For extra seal, apply tape to all 4 sides around the pouch or wafer, as if you were framing a picture. You may use any brand of medical adhesive tape. Change your pouch every 5 to 7 days. Change it immediately if a leak occurs.  Wash your hands afterwards.  If you are wearing a  2-piece system, you may use 2 new pouches per week and alternate them. Rinse the pouch with mild soap and warm water and hang it to dry for the next day. Apply the fresh pouch. Alternate the 2 pouches like this for a week. After a week, change the wafer and begin with 2 new pouches. Place the old pouches in a plastic bag, and put them in the trash.   LIVING WITH AN OSTOMY  Emptying Your Pouch Empty your pouch when it is one-third full (of urine, stool, and/or gas). If you wait until your pouch is fuller than this, it will be more difficult to empty and more noticeable. When you empty your pouch, either put toilet paper in the toilet bowl first, or flush the   toilet while you empty the pouch. This will reduce splashing. You can empty the pouch between your legs or to one side while sitting, or while standing or stooping. If you have a 2-piece system, you can snap off the pouch to empty it. Remember that your stoma may function during this time. If you wish to rinse your pouch after you empty it, a turkey baster can be helpful. When using a baster, squirt water up into the pouch through the opening at the bottom. With a 2-piece system, you can snap off the pouch to rinse it. After rinsing  your pouch, empty it into the toilet. When rinsing your pouch at home, put a few granules of Dreft soap in the rinse water. This helps lubricate and freshen your pouch. The inside of your pouch can be sprayed with non-stick cooking oil (Pam spray). This may help reduce stool sticking to the inside of the pouch.  Bathing You may shower or bathe with your pouch on or off. Remember that your stoma may function during this time.  The materials you use to wash your stoma and the skin around it should be clean, but they do not need to be sterile.  Wearing Your Pouch During hot weather, or if you perspire a lot in general, wear a cover over your pouch. This may prevent a rash on your skin under the pouch. Pouch covers are  sold at ostomy supply stores. Wear the pouch inside your underwear for better support. Watch your weight. Any gain or loss of 10 to 15 pounds or more can change the way your pouch fits.  Going Away From Home A collapsible cup (like those that come in travel kits) or a soft plastic squirt bottle with a pull-up top (like a travel bottle for shampoo) can be used for rinsing your pouch when you are away from home. Tilt the opening of the pouch at an upward angle when using a cup to rinse.  Carry wet wipes or extra tissues to use in public bathrooms.  Carry an extra pouching system with you at all times.  Never keep ostomy supplies in the glove compartment of your car. Extreme heat or cold can damage the skin barriers and adhesive wafers on the pouch.  When you travel, carry your ostomy supplies with you at all times. Keep them within easy reach. Do not pack ostomy supplies in baggage that will be checked or otherwise separated from you, because your baggage might be lost. If you're traveling out of the country, it is helpful to have a letter stating that you are carrying ostomy supplies as a medical necessity.  If you need ostomy supplies while traveling, look in the yellow pages of the telephone book under "Surgical Supplies." Or call the local ostomy organization to find out where supplies are available.  Do not let your ostomy supplies get low. Always order new pouches before you use the last one.  Reducing Odor Limit foods such as broccoli, cabbage, onions, fish, and garlic in your diet to help reduce odor. Each time you empty your pouch, carefully clean the opening of the pouch, both inside and outside, with toilet paper. Rinse your pouch 1 or 2 times daily after you empty it (see directions for emptying your pouch and going away from home). Add deodorant (such as Super Banish or Nullo) to your pouch. Use air deodorizers in your bathroom. Do not add aspirin to your pouch. Even though  aspirin can help prevent odor, it   could cause ulcers on your stoma.  When to call the doctor Call the doctor if you have any of the following symptoms: Purple, black, or white stoma Severe cramps lasting more than 6 hours Severe watery discharge from the stoma lasting more than 6 hours No output from the colostomy for 3 days Excessive bleeding from your stoma Swelling of your stoma to more than 1/2-inch larger than usual Pulling inward of your stoma below skin level Severe skin irritation or deep ulcers Bulging or other changes in your abdomen  When to call your ostomy nurse Call your ostomy/enterostomal therapy (WOCN) nurse if any of the following occurs: Frequent leaking of your pouching system Change in size or appearance of your stoma, causing discomfort or problems with your pouch Skin rash or rawness Weight gain or loss that causes problems with your pouch     FREQUENTLY ASKED QUESTIONS   Why haven't you met any of these folks who have an ostomy?  Well, maybe you have! You just did not recognize them because an ostomy doesn't show. It can be kept secret if you wish. Why, maybe some of your best friends, office associates or neighbors have an ostomy ... you never can tell. People facing ostomy surgery have many quality-of-life questions like: Will you bulge? Smell? Make noises? Will you feel waste leaving your body? Will you be a captive of the toilet? Will you starve? Be a social outcast? Get/stay married? Have babies? Easily bathe, go swimming, bend over?  OK, let's look at what you can expect:   Will you bulge?  Remember, without part of the intestine or bladder, and its contents, you should have a flatter tummy than before. You can expect to wear, with little exception, what you wore before surgery ... and this in-cludes tight clothing and bathing suits.   Will you smell?  Today, thanks to modern odor proof pouching systems, you can walk into an ostomy support group  meeting and not smell anything that is foul or offensive. And, for those with an ileostomy or colostomy who are concerned about odor when emptying their pouch, there are in-pouch deodorants that can be used to eliminate any waste odors that may exist.   Will you make noises?  Everyone produces gas, especially if they are an air-swallower. But intestinal sounds that occur from time to time are no differ-ent than a gurgling tummy, and quite often your clothing will muffle any sounds.   Will you feel the waste discharges?  For those with a colostomy or ileostomy there might be a slight pressure when waste leaves your body, but understand that the intestines have no nerve endings, so there will be no unpleasant sensations. Those with a urostomy will probably be unaware of any kidney drainage.   Will you be a captive of the toilet?  Immediately post-op you will spend more time in the bathroom than you will after your body recovers from surgery. Every person is different, but on average those with an ileostomy or urostomy may empty their pouches 4 to 6 times a day; a little  less if you have a colostomy. The average wear time between pouch system changes is 3 to 5 days and the changing process should take less than 30 minutes.   Will I need to be on a special diet? Most people return to their normal diet when they have recovered from surgery. Be sure to chew your food well, eat a well-balanced diet and drink plenty of fluids. If   you experience problems with a certain food, wait a couple of weeks and try it again.  Will there be odor and noises? Pouching systems are designed to be odor-proof or odor-resistant. There are deodorants that can be used in the pouch. Medications are also available to help reduce odor. Limit gas-producing foods and carbonated beverages. You will experience less gas and fewer noises as you heal from surgery.  How much time will it take to care for my ostomy? At first, you may  spend a lot of time learning about your ostomy and how to take care of it. As you become more comfortable and skilled at changing the pouching system, it will take very little time to care for it.   Will I be able to return to work? People with ostomies can perform most jobs. As soon as you have healed from surgery, you should be able to return to work. Heavy lifting (more than 10 pounds) may be discouraged.   What about intimacy? Sexual relationships and intimacy are important and fulfilling aspects of your life. They should continue after ostomy surgery. Intimacy-related concerns should be discussed openly between you and your partner.   Can I wear regular clothing? You do not need to wear special clothing. Ostomy pouches are fairly flat and barely noticeable. Elastic undergarments will not hurt the stoma or prevent the ostomy from functioning.   Can I participate in sports? An ostomy should not limit your involvement in sports. Many people with ostomies are runners, skiers, swimmers or participate in other active lifestyles. Talk with your caregiver first before doing heavy physical activity.  Will you starve?  Not if you follow doctor's orders at each stage of your post-op adjustment. There is no such thing as an "ostomy diet". Some people with an ostomy will be able to eat and tolerate anything; others may find diffi-culty with some foods. Each person is an individual and must determine, by trial, what is best for them. A good practice for all is to drink plenty of water.   Will you be a social outcast?  Have you met anyone who has an ostomy and is a social outcast? Why should you be the first? Only your attitude and self image will effect how you are treated. No confi-dent person is an outcast.    PROFESSIONAL HELP   Resources are available if you need help or have questions about your ostomy.   Specially trained nurses called Wound, Ostomy Continence Nurses (WOCN) are available for  consultation in most major medical centers.  Consider getting an ostomy consult at an outpatient ostomy clinic.   [facility] has an Ostomy Clinic run by an WOCN ostomy nurse at the [facility].  [phone]. [facility] can help set up an appointment   The [employer] Ostomy Association (UOA) is a group made up of many local chapters throughout the [place]. These local groups hold meetings and provide support to prospective and existing ostomates. They sponsor educational events and have qualified visitors to make personal or telephone visits. Contact the UOA for the chapter nearest you and for other educational publications.  More detailed information can be found in Colostomy Guide, a publication of the United Ostomy Association (UOA). Contact UOA at 1-800-826-0826 or visit their web site at www.uoaa.org. The website contains links to other sites, suppliers and resources.  [facility] Secure Start Services: Start at the website to enlist for support.  Your Wound Ostomy (WOCN) nurse may have started this   process. [email or web address]/securestart Secure Start services are designed to support people as they live their lives with an ostomy or neurogenic bladder. Enrolling is easy and at no cost to the patient. We realize that each person's needs and life journey are different. Through Secure Start services, we want to help people live their life, their way.  #######################################################  

## 2023-06-09 NOTE — Plan of Care (Signed)
  Problem: Nutritional: Goal: Will attain and maintain optimal nutritional status will improve Outcome: Progressing   Problem: Health Behavior/Discharge Planning: Goal: Ability to manage health-related needs will improve Outcome: Progressing   Problem: Clinical Measurements: Goal: Ability to maintain clinical measurements within normal limits will improve Outcome: Progressing   Problem: Clinical Measurements: Goal: Diagnostic test results will improve Outcome: Progressing   Problem: Nutrition: Goal: Adequate nutrition will be maintained Outcome: Progressing

## 2023-06-09 NOTE — Plan of Care (Signed)
  Problem: Education: Goal: Understanding of discharge needs will improve Outcome: Progressing Goal: Verbalization of understanding of the causes of altered bowel function will improve Outcome: Progressing   Problem: Activity: Goal: Ability to tolerate increased activity will improve Outcome: Progressing

## 2023-06-09 NOTE — Evaluation (Signed)
Physical Therapy Evaluation Patient Details Name: [name] MRN: [id number] DOB: 09-20-1950 Today's Date: 06/09/2023  History of Present Illness  Pt is 72 yo female admitted on 06/08/23 with R colon mass and s/p robotic assisted partial colectomy on 06/08/23.  Pt with hx including but not limited to anemia, arthritis, COPD, HTN, HLD, breast CA, CAD, DM, CVA, CABG, back surgery  Clinical Impression  Pt admitted with above diagnosis. At baseline, pt is ambulatory with cane but fatigues in community.  She has home support but does live in 2 level condo. Today, pt able to perform STS with CGA and ambulated 250' with CGA and RW.  Noted in MD note that pt has upcoming outpt orthopedic procedure in a few weeks.  Will maintain on acute PT caseload to advance and for stair training but expect no PT needs at d/c.  Pt currently with functional limitations due to the deficits listed below (see PT Problem List). Pt will benefit from acute skilled PT to increase their independence and safety with mobility to allow discharge.  Discussed initial use of RW but could benefit from rollator in future to allow for rest breaks in community.         If plan is discharge home, recommend the following: A little help with walking and/or transfers;A little help with bathing/dressing/bathroom;Assistance with cooking/housework;Help with stairs or ramp for entrance   Can travel by private vehicle        Equipment Recommendations Rollator (4 wheels)  Recommendations for Other Services       Functional Status Assessment Patient has had a recent decline in their functional status and demonstrates the ability to make significant improvements in function in a reasonable and predictable amount of time.     Precautions / Restrictions Precautions Precautions: Fall      Mobility  Bed Mobility               General bed mobility comments: Pt OOB.  Reports nursing showed her how to do a log roll.  Did discuss  positions of comfort for sleep (recliner, supine with head elevated and pillow under knees)    Transfers Overall transfer level: Needs assistance Equipment used: Rolling walker (2 wheels) Transfers: Sit to/from Stand Sit to Stand: Supervision           General transfer comment: Increased time, educated on scooting forward and leaning forward, no assist to rise    Ambulation/Gait Ambulation/Gait assistance: Supervision Gait Distance (Feet): 250 Feet Assistive device: Rolling walker (2 wheels) Gait Pattern/deviations: Step-through pattern Gait velocity: decreased but functional     General Gait Details: Good stability with RW; took 2 standing rest breaks  Stairs            Wheelchair Mobility     Tilt Bed    Modified Rankin (Stroke Patients Only)       Balance Overall balance assessment: Needs assistance Sitting-balance support: No upper extremity supported Sitting balance-Leahy Scale: Good     Standing balance support: Bilateral upper extremity supported, No upper extremity supported Standing balance-Leahy Scale: Fair Standing balance comment: RW to ambulate but could stand without support                             Pertinent Vitals/Pain Pain Assessment Pain Assessment: 0-10 Pain Score: 4  Pain Location: abdomen Pain Descriptors / Indicators: Discomfort Pain Intervention(s): Limited activity within patient's tolerance, Monitored during session    Home Living  Family/patient expects to be discharged to:: Private residence Living Arrangements: Spouse/significant other Available Help at Discharge: Family;Available PRN/intermittently Type of Home: Apartment (condo) Home Access: Stairs to enter   [employer] of Steps: 2 (curb then 1 step) Alternate Level Stairs-Number of Steps: flight Home Layout: Two level;1/2 bath on main level;Bed/bath upstairs (can sleep in recliner on entry level) Home Equipment: BSC/3in1;Cane - single  point;Shower Counsellor (2 wheels)      Prior Function Prior Level of Function : Independent/Modified Independent             Mobility Comments: Reports independent with ambulation but did get tired in community; does use cane ADLs Comments: independent adls and iadls     Extremity/Trunk Assessment   Upper Extremity Assessment Upper Extremity Assessment: Overall WFL for tasks assessed    Lower Extremity Assessment Lower Extremity Assessment: Overall WFL for tasks assessed (MMT At least 3/5 but did not MMT due to abdominal sx)    Cervical / Trunk Assessment Cervical / Trunk Assessment: Normal  Communication      Cognition Arousal: Alert Behavior During Therapy: WFL for tasks assessed/performed Overall Cognitive Status: Within Functional Limits for tasks assessed                                          General Comments General comments (skin integrity, edema, etc.): Pt asking about rollator for community for rest breaks.  Discussed starting with RW initially as it offers more support and promote better posture after abdominal surgery but could transition to rollator.    Exercises     Assessment/Plan    PT Assessment Patient needs continued PT services  PT Problem List Decreased strength;Decreased range of motion;Decreased activity tolerance;Decreased balance;Decreased mobility;Decreased knowledge of use of DME;Pain       PT Treatment Interventions DME instruction;Therapeutic exercise;Gait training;Stair training;Functional mobility training;Therapeutic activities;Patient/family education;Modalities;Balance training;Neuromuscular re-education    PT Goals (Current goals can be found in the Care Plan section)  Acute Rehab PT Goals Patient Stated Goal: return home PT Goal Formulation: With patient Time For Goal Achievement: 06/23/23 Potential to Achieve Goals: Good    Frequency Min 1X/week     Co-evaluation               AM-PAC  PT "6 Clicks" Mobility  Outcome Measure Help needed turning from your back to your side while in a flat bed without using bedrails?: A Little Help needed moving from lying on your back to sitting on the side of a flat bed without using bedrails?: A Little Help needed moving to and from a bed to a chair (including a wheelchair)?: A Little Help needed standing up from a chair using your arms (e.g., wheelchair or bedside chair)?: A Little Help needed to walk in hospital room?: A Little Help needed climbing 3-5 steps with a railing? : A Little 6 Click Score: 18    End of Session Equipment Utilized During Treatment: Gait belt Activity Tolerance: Patient tolerated treatment well Patient left: in chair;with call bell/phone within reach;with family/visitor present Nurse Communication: Mobility status PT Visit Diagnosis: Other abnormalities of gait and mobility (R26.89);Muscle weakness (generalized) (M62.81)    Time: 1610-9604 PT Time Calculation (min) (ACUTE ONLY): 32 min   Charges:   PT Evaluation $PT Eval Low Complexity: 1 Low PT Treatments $Gait Training: 8-22 mins PT General Charges $$ ACUTE PT VISIT: 1 Visit  Anise Salvo, PT Acute Rehab [facility] [phone]   Rayetta Humphrey 06/09/2023, 5:24 PM

## 2023-06-09 NOTE — Inpatient Diabetes Management (Signed)
Inpatient Diabetes Program Recommendations  AACE/ADA: New Consensus Statement on Inpatient Glycemic Control (2015)  Target Ranges:  Prepandial:   less than 140 mg/dL      Peak postprandial:   less than 180 mg/dL (1-2 hours)      Critically ill patients:  140 - 180 mg/dL   Lab Results  Component Value Date   GLUCAP 135 (H) 06/09/2023   HGBA1C 7.3 (A) 03/14/2023    Review of Glycemic Control  Latest Reference Range & Units 06/08/23 06:11 06/08/23 10:40 06/08/23 12:26 06/08/23 16:38 06/08/23 21:02 06/09/23 07:54  Glucose-Capillary 70 - 99 mg/dL 621 (H) 308 (H) 657 (H) 293 (H) 254 (H) 135 (H)   Diabetes history: DM 2 Outpatient Diabetes medications: Farxiga 10 mg Daily, Lantus 10 units qhs Current orders for Inpatient glycemic control:  Semglee 10 units qhs Novolog 0-20 units tid  Full liquid diet Decadron 5 mg given yesterday during surgery  Note: Glucose trends increased throughout the day yesterday in response to the decadron given. Trends lower today, watch for now. If glucose trends increase after PO intake, may consider low dose Novolog meal coverage tid if pt eating >50% of meals.  Thanks,  Christena Deem RN, MSN, BC-ADM Inpatient Diabetes Coordinator Team Pager [phone] (8a-5p)

## 2023-06-10 LAB — BASIC METABOLIC PANEL
Anion gap: 6 (ref 5–15)
BUN: 35 mg/dL — ABNORMAL HIGH (ref 8–23)
CO2: 15 mmol/L — ABNORMAL LOW (ref 22–32)
Calcium: 8.1 mg/dL — ABNORMAL LOW (ref 8.9–10.3)
Chloride: 106 mmol/L (ref 98–111)
Creatinine, Ser: 2.75 mg/dL — ABNORMAL HIGH (ref 0.44–1.00)
GFR, Estimated: 18 mL/min — ABNORMAL LOW (ref >60)
Glucose, Bld: 144 mg/dL — ABNORMAL HIGH (ref 70–99)
Potassium: 5.3 mmol/L — ABNORMAL HIGH (ref 3.5–5.1)
Sodium: 127 mmol/L — ABNORMAL LOW (ref 135–145)

## 2023-06-10 LAB — CBC
HCT: 31.1 % — ABNORMAL LOW (ref 36.0–46.0)
Hemoglobin: 9 g/dL — ABNORMAL LOW (ref 12.0–15.0)
MCH: 24.7 pg — ABNORMAL LOW (ref 26.0–34.0)
MCHC: 28.9 g/dL — ABNORMAL LOW (ref 30.0–36.0)
MCV: 85.4 fL (ref 80.0–100.0)
Platelets: 178 10*3/uL (ref 150–400)
RBC: 3.64 MIL/uL — ABNORMAL LOW (ref 3.87–5.11)
RDW: 16.5 % — ABNORMAL HIGH (ref 11.5–15.5)
WBC: 7.1 10*3/uL (ref 4.0–10.5)
nRBC: 0 % (ref 0.0–0.2)

## 2023-06-10 LAB — GLUCOSE, CAPILLARY
Glucose-Capillary: 120 mg/dL — ABNORMAL HIGH (ref 70–99)
Glucose-Capillary: 164 mg/dL — ABNORMAL HIGH (ref 70–99)
Glucose-Capillary: 125 mg/dL — ABNORMAL HIGH (ref 70–99)
Glucose-Capillary: 135 mg/dL — ABNORMAL HIGH (ref 70–99)

## 2023-06-10 MED ORDER — SODIUM CHLORIDE 0.9 % IV SOLN: INTRAVENOUS | Status: AC

## 2023-06-10 NOTE — Progress Notes (Signed)
Mobility Specialist - Progress Note   06/10/23 1010  Mobility  Activity Ambulated with assistance in hallway;Ambulated with assistance to bathroom  Level of Assistance Standby assist, set-up cues, supervision of patient - no hands on  Assistive Device Front wheel walker  Distance Ambulated (ft) 275 ft  Activity Response Tolerated well  Mobility Referral Yes  Mobility visit 1 Mobility  Mobility Specialist Start Time (ACUTE ONLY) 0955  Mobility Specialist Stop Time (ACUTE ONLY) 1007  Mobility Specialist Time Calculation (min) (ACUTE ONLY) 12 min   Pt received in bed and agreeable to mobility. Pt took x2 standing rest breaks d/t soreness in abdomen. No complaints during session. Pt to bathroom after session with all needs met. Instructed pt to pull call bell when finished. NT made aware.    [facility]

## 2023-06-10 NOTE — Progress Notes (Signed)
2 Days Post-Op   Subjective/Chief Complaint: No complaints   Objective: Vital signs in last 24 hours: Temp:  [97.8 F (36.6 C)-98.8 F (37.1 C)] 97.8 F (36.6 C) (12/14 0550) Pulse Rate:  [63-72] 63 (12/14 0550) Resp:  [15-20] 15 (12/14 0550) BP: (103-142)/(47-61) 132/61 (12/14 0550) SpO2:  [92 %-99 %] 93 % (12/14 0550) Last BM Date : 06/07/23  Intake/Output from previous day: 12/13 0701 - 12/14 0700 In: 2823.9 [P.O.:1130; I.V.:1693.9] Out: 200 [Urine:200] Intake/Output this shift: Total I/O In: 240 [P.O.:240] Out: 450 [Urine:450]  General appearance: alert and cooperative Resp: clear to auscultation bilaterally Cardio: regular rate and rhythm GI: Soft, mild tenderness  Lab Results:  Recent Labs    06/09/23 0432 06/10/23 0628  WBC 8.0 7.1  HGB 9.9* 9.0*  HCT 31.8* 31.1*  PLT 203 178   BMET Recent Labs    06/09/23 0432 06/10/23 0628  NA 131* 127*  K 5.5* 5.3*  CL 106 106  CO2 17* 15*  GLUCOSE 183* 144*  BUN 32* 35*  CREATININE 2.28* 2.75*  CALCIUM 8.4* 8.1*   PT/INR No results for input(s): "LABPROT", "INR" in the last 72 hours. ABG No results for input(s): "PHART", "HCO3" in the last 72 hours.  Invalid input(s): "PCO2", "PO2"  Studies/Results: No results found.  Anti-infectives: Anti-infectives (From admission, onward)    Start     Dose/Rate Route Frequency Ordered Stop   06/08/23 2000  cefoTEtan (CEFOTAN) 2 g in sodium chloride 0.9 % 100 mL IVPB        2 g 200 mL/hr over 30 Minutes Intravenous Every 12 hours 06/08/23 1210 06/08/23 2031   06/08/23 0600  cefoTEtan (CEFOTAN) 2 g in sodium chloride 0.9 % 100 mL IVPB        2 g 200 mL/hr over 30 Minutes Intravenous On call to O.R. 06/08/23 0546 06/08/23 0756       Assessment/Plan: s/p Procedure(s): XI ROBOT ASSISTED LAPAROSCOPIC PARTIAL COLECTOMY (N/A) Advance diet Creatinine still going up.  Will increase IV fluids.  Recheck creatinine tomorrow Ambulate POD2  LOS: 2 days    Chevis Pretty III 06/10/2023

## 2023-06-10 NOTE — Plan of Care (Signed)
  Problem: Activity: Goal: Ability to tolerate increased activity will improve Outcome: Progressing   

## 2023-06-11 LAB — CBC
HCT: 29.6 % — ABNORMAL LOW (ref 36.0–46.0)
Hemoglobin: 8.9 g/dL — ABNORMAL LOW (ref 12.0–15.0)
MCH: 25.1 pg — ABNORMAL LOW (ref 26.0–34.0)
MCHC: 30.1 g/dL (ref 30.0–36.0)
MCV: 83.4 fL (ref 80.0–100.0)
Platelets: 158 10*3/uL (ref 150–400)
RBC: 3.55 MIL/uL — ABNORMAL LOW (ref 3.87–5.11)
RDW: 16.4 % — ABNORMAL HIGH (ref 11.5–15.5)
WBC: 7.9 10*3/uL (ref 4.0–10.5)
nRBC: 0 % (ref 0.0–0.2)

## 2023-06-11 LAB — BASIC METABOLIC PANEL
Anion gap: 5 (ref 5–15)
BUN: 34 mg/dL — ABNORMAL HIGH (ref 8–23)
CO2: 16 mmol/L — ABNORMAL LOW (ref 22–32)
Calcium: 7.9 mg/dL — ABNORMAL LOW (ref 8.9–10.3)
Chloride: 109 mmol/L (ref 98–111)
Creatinine, Ser: 2.18 mg/dL — ABNORMAL HIGH (ref 0.44–1.00)
GFR, Estimated: 23 mL/min — ABNORMAL LOW (ref >60)
Glucose, Bld: 110 mg/dL — ABNORMAL HIGH (ref 70–99)
Potassium: 5.2 mmol/L — ABNORMAL HIGH (ref 3.5–5.1)
Sodium: 130 mmol/L — ABNORMAL LOW (ref 135–145)

## 2023-06-11 LAB — GLUCOSE, CAPILLARY
Glucose-Capillary: 95 mg/dL (ref 70–99)
Glucose-Capillary: 159 mg/dL — ABNORMAL HIGH (ref 70–99)
Glucose-Capillary: 160 mg/dL — ABNORMAL HIGH (ref 70–99)
Glucose-Capillary: 154 mg/dL — ABNORMAL HIGH (ref 70–99)

## 2023-06-11 MED ORDER — HEPARIN SODIUM (PORCINE) 5000 UNIT/ML IJ SOLN
5000.0000 [IU] | Freq: Three times a day (TID) | INTRAMUSCULAR | Status: DC
  Administered 2023-06-11 – 2023-06-12 (×5): 5000 [IU] via SUBCUTANEOUS
  Filled 2023-06-11 (×5): qty 1

## 2023-06-11 NOTE — Plan of Care (Signed)
  Problem: Skin Integrity: Goal: Will show signs of wound healing Outcome: Progressing   Problem: Education: Goal: Knowledge of General Education information will improve Description: Including pain rating scale, medication(s)/side effects and non-pharmacologic comfort measures Outcome: Progressing   Problem: Health Behavior/Discharge Planning: Goal: Ability to manage health-related needs will improve Outcome: Progressing

## 2023-06-11 NOTE — Progress Notes (Signed)
3 Days Post-Op   Subjective/Chief Complaint: Still with trouble moving/standing, tol diet, having bms, pain controlled, voiding   Objective: Vital signs in last 24 hours: Temp:  [97.9 F (36.6 C)-98.9 F (37.2 C)] 98.6 F (37 C) (12/15 0532) Pulse Rate:  [72-80] 72 (12/15 0532) Resp:  [19-20] 20 (12/15 0532) BP: (135-163)/(59-65) 135/59 (12/15 0532) SpO2:  [94 %-95 %] 95 % (12/15 0532) Weight:  [123.6 kg] 123.6 kg (12/15 0500) Last BM Date : 06/07/23  Intake/Output from previous day: 12/14 0701 - 12/15 0700 In: 2708.5 [P.O.:930; I.V.:1778.5] Out: 950 [Urine:950] Intake/Output this shift: No intake/output data recorded.  General nad Cv regular Pulm effort normal Ab soft approp tender dressing dry  Lab Results:  Recent Labs    06/10/23 0628 06/11/23 0523  WBC 7.1 7.9  HGB 9.0* 8.9*  HCT 31.1* 29.6*  PLT 178 158   BMET Recent Labs    06/10/23 0628 06/11/23 0523  NA 127* 130*  K 5.3* 5.2*  CL 106 109  CO2 15* 16*  GLUCOSE 144* 110*  BUN 35* 34*  CREATININE 2.75* 2.18*  CALCIUM 8.1* 7.9*   PT/INR No results for input(s): "LABPROT", "INR" in the last 72 hours. ABG No results for input(s): "PHART", "HCO3" in the last 72 hours.  Invalid input(s): "PCO2", "PO2"  Studies/Results: No results found.  Anti-infectives: Anti-infectives (From admission, onward)    Start     Dose/Rate Route Frequency Ordered Stop   06/08/23 2000  cefoTEtan (CEFOTAN) 2 g in sodium chloride 0.9 % 100 mL IVPB        2 g 200 mL/hr over 30 Minutes Intravenous Every 12 hours 06/08/23 1210 06/08/23 2031   06/08/23 0600  cefoTEtan (CEFOTAN) 2 g in sodium chloride 0.9 % 100 mL IVPB        2 g 200 mL/hr over 30 Minutes Intravenous On call to O.R. 06/08/23 0546 06/08/23 0756       Assessment/Plan: POD 3 right colectomy -needs PT follow up- not sure she is able to go home yet -Cr better but still above baseline, will do more iv fluids today and recheck in am -regular diet -will  do sq heparin due to Cr -home meds  Emelia Loron 06/11/2023

## 2023-06-11 NOTE — Progress Notes (Signed)
Pharmacy Brief Note - Alvimopan (Entereg)  The standing order set for alvimopan (Entereg) now includes an automatic order to discontinue the drug after the patient has had a bowel movement. The change was approved by the Pharmacy & Therapeutics Committee and the Medical Executive Committee.   This patient has had bowel movements documented by nursing. Therefore, alvimopan has been discontinued. If there are questions, please contact the pharmacy at [phone].   Thank you-  Dorna Leitz, PharmD, BCPS 06/11/2023 10:25 AM

## 2023-06-12 ENCOUNTER — Other Ambulatory Visit (HOSPITAL_COMMUNITY): Payer: Self-pay

## 2023-06-12 LAB — BASIC METABOLIC PANEL
Anion gap: 6 (ref 5–15)
BUN: 26 mg/dL — ABNORMAL HIGH (ref 8–23)
CO2: 18 mmol/L — ABNORMAL LOW (ref 22–32)
Calcium: 8.5 mg/dL — ABNORMAL LOW (ref 8.9–10.3)
Chloride: 110 mmol/L (ref 98–111)
Creatinine, Ser: 1.56 mg/dL — ABNORMAL HIGH (ref 0.44–1.00)
GFR, Estimated: 35 mL/min — ABNORMAL LOW (ref >60)
Glucose, Bld: 89 mg/dL (ref 70–99)
Potassium: 4.8 mmol/L (ref 3.5–5.1)
Sodium: 134 mmol/L — ABNORMAL LOW (ref 135–145)

## 2023-06-12 LAB — SURGICAL PATHOLOGY

## 2023-06-12 LAB — GLUCOSE, CAPILLARY
Glucose-Capillary: 90 mg/dL (ref 70–99)
Glucose-Capillary: 213 mg/dL — ABNORMAL HIGH (ref 70–99)

## 2023-06-12 MED ORDER — HYDROCODONE-ACETAMINOPHEN 5-325 MG PO TABS
1.0000 | ORAL_TABLET | Freq: Four times a day (QID) | ORAL | 0 refills | Status: DC | PRN
  Filled 2023-06-12: qty 30, 8d supply, fill #0

## 2023-06-12 NOTE — Plan of Care (Signed)
  Problem: Bowel/Gastric: Goal: Gastrointestinal status for postoperative course will improve Outcome: Progressing   Problem: Education: Goal: Knowledge of General Education information will improve Description: Including pain rating scale, medication(s)/side effects and non-pharmacologic comfort measures Outcome: Progressing

## 2023-06-12 NOTE — Discharge Summary (Signed)
Physician Discharge Summary  Patient ID: [name] MRN: [id number] DOB/AGE: Apr 03, 1951 72 y.o.  Admit date: 06/08/2023 Discharge date: 06/12/2023  Admission Diagnoses: Colon mass  Discharge Diagnoses:  Principal Problem:   Colonic mass   Discharged Condition: good  Hospital Course: Patient was admitted to the med surg floor after surgery.  Diet was advanced as tolerated.  Patient began to have bowel function on postop day 2.  Her Cr was noted to be elevated most likely due to dehydration from bowel prep.  This began to resolve on POD 3.  By postop day 4, she was tolerating a solid diet and pain was controlled with oral medications.  She was urinating without difficulty and ambulating without assistance.  Patient was felt to be in stable condition for discharge to home.   Consults: None  Significant Diagnostic Studies: labs: cbc, bmet  Treatments: IV hydration, analgesia: Vicodin, and surgery: robotic R colectomy  Discharge Exam: Blood pressure (!) 154/61, pulse 80, temperature 98.4 F (36.9 C), temperature source Oral, resp. rate 18, height 5\' 8"  (1.727 m), weight 123.6 kg, SpO2 92%. General appearance: alert and cooperative GI: soft, non-distended Incision/Wound: clean, dry, intact  Disposition: Discharge disposition: 01-Home or Self Care        Allergies as of 06/12/2023   No Known Allergies      Medication List     TAKE these medications    albuterol 108 (90 Base) MCG/ACT inhaler Commonly known as: VENTOLIN HFA Inhale 2 puffs into the lungs every 6 (six) hours as needed for wheezing or shortness of breath.   amLODipine 10 MG tablet Commonly known as: NORVASC TAKE 1 TABLET EVERY DAY   aspirin EC 81 MG tablet Take 1 tablet (81 mg total) by mouth daily.   atorvastatin 80 MG tablet Commonly known as: LIPITOR TAKE 1 TABLET EVERY DAY.   buPROPion 150 MG 24 hr tablet Commonly known as: WELLBUTRIN XL TAKE 1 TABLET TWICE DAILY   carvedilol 25 MG  tablet Commonly known as: COREG TAKE 1 TABLET TWICE DAILY   celecoxib 50 MG capsule Commonly known as: CeleBREX Take 1 capsule (50 mg total) by mouth 2 (two) times daily as needed for pain.   diclofenac Sodium 1 % Gel Commonly known as: VOLTAREN APPLY 2 GRAMS TOPICALLY 4 (FOUR) TIMES DAILY AS NEEDED.   docusate sodium 100 MG capsule Commonly known as: COLACE Take 100 mg by mouth daily as needed for mild constipation.   DULoxetine 30 MG capsule Commonly known as: CYMBALTA Take 1 capsule (30 mg total) by mouth daily.   Farxiga 10 MG Tabs tablet Generic drug: dapagliflozin propanediol Take 10 mg by mouth daily.   FeroSul 325 (65 Fe) MG tablet Generic drug: ferrous sulfate Take 325 mg by mouth daily.   furosemide 20 MG tablet Commonly known as: LASIX Take 20 mg by mouth daily.   gabapentin 300 MG capsule Commonly known as: Neurontin Take 1 capsule (300 mg total) by mouth 3 (three) times daily.   hydrALAZINE 100 MG tablet Commonly known as: APRESOLINE TAKE 1 TABLET TWICE DAILY   HYDROcodone-acetaminophen 5-325 MG tablet Commonly known as: Norco Take 1 tablet by mouth every 6 (six) hours as needed for moderate pain (pain score 4-6). What changed: when to take this   insulin glargine 100 UNIT/ML Solostar Pen Commonly known as: LANTUS Inject 10 Units into the skin at bedtime.   letrozole 2.5 MG tablet Commonly known as: FEMARA TAKE 1 TABLET EVERY DAY   methocarbamol 500 MG  tablet Commonly known as: ROBAXIN TAKE 1 TABLET EVERY 8 HOURS AS NEEDED FOR MUSCLE SPASMS.   multivitamin with minerals Tabs tablet Take 1 tablet by mouth daily.   pantoprazole 40 MG tablet Commonly known as: PROTONIX Take 1 tablet (40 mg total) by mouth daily.   Pen Needles 31G X 8 MM Misc UAD   traMADol 50 MG tablet Commonly known as: ULTRAM Take 1 tablet (50 mg total) by mouth every 12 (twelve) hours as needed.   traZODone 50 MG tablet Commonly known as: DESYREL TAKE 1 TABLET AT  BEDTIME AS NEEDED FOR SLEEP   valsartan-hydrochlorothiazide 160-25 MG tablet Commonly known as: DIOVAN-HCT Take 1 tablet by mouth daily.               Durable Medical Equipment  (From admission, onward)           Start     Ordered   06/12/23 1019  For home use only DME 4 wheeled rolling walker with seat  Once       Question:  Patient needs a walker to treat with the following condition  Answer:  Post-operative pain   06/12/23 1019            Follow-up Information     Romie Levee, MD. Schedule an appointment as soon as possible for a visit in 2 week(s).   Specialties: General Surgery, Colon and Rectal Surgery Contact information: [address] [place] [address] [phone]         [facility] Follow up.   Specialty: Home Health Services Why: H Contact information: [address] [place] [address] [phone]                 Signed: Vanita Panda 06/12/2023, 10:25 AM

## 2023-06-12 NOTE — TOC Transition Note (Addendum)
Transition of Care [facility]) - Discharge Note   Patient Details  Name: [name] MRN: [id number] Date of Birth: 02-18-1951  Transition of Care [facility]) CM/SW Contact:  Darleene Cleaver, LCSW Phone Number: 06/12/2023, 10:45 AM   Clinical Narrative:     CSW spoke to patient and daughter regarding HH PT and DME needs.  Per patient's daughter [name], [phone], patient will need a bariatric rolling walker with a seat.  CSW was able to order the DME through [employer], and spoke to [place], he will order it and deliver to room prior to patient discharging.  CSW was asked to set up [facility] PT for patient, CSW spoke to daughter and patient, they did not have a preference for an agency.  CSW was able to set it up with Tresa Endo at [facility].  Per Tresa Endo they should be able to see patient within 24-48 hours.  TOC signing off, patient's family will transporting her home.   Final next level of care: Home w Home Health Services Barriers to Discharge: Barriers Resolved   Patient Goals and CMS Choice Patient states their goals for this hospitalization and ongoing recovery are:: To return back home with home health. CMS Medicare.gov Compare Post Acute Care list provided to:: Patient Represenative (must comment) (Patient's daughter.) Choice offered to / list presented to : Adult Children [place] ownership interest in [facility].provided to:: Adult Children    Discharge Placement                    Patient and family notified of of transfer: 06/12/23  Discharge Plan and Services Additional resources added to the After Visit Summary for                  DME Arranged: Walker rolling with seat DME Agency: [employer] Date DME Agency Contacted: 06/12/23 Time DME Agency Contacted: 1044 Representative spoke with at DME Agency: Marthann Schiller HH Arranged: PT HH Agency: [facility] Date [facility] Agency Contacted: 06/12/23 Time HH Agency Contacted: 1044 Representative spoke with at [facility]  Agency: Engineer, production  Social Drivers of Health (SDOH) Interventions SDOH Screenings   Food Insecurity: No Food Insecurity (06/08/2023)  Housing: Unknown (06/08/2023)  Transportation Needs: No Transportation Needs (06/08/2023)  Utilities: Not At Risk (06/08/2023)  Alcohol Screen: Low Risk  (01/24/2023)  Depression (PHQ2-9): Low Risk  (03/14/2023)  Financial Resource Strain: Low Risk  (01/24/2023)  Physical Activity: Insufficiently Active (01/24/2023)  Social Connections: Moderately Integrated (01/24/2023)  Stress: No Stress Concern Present (01/24/2023)  Tobacco Use: Medium Risk (06/08/2023)  Health Literacy: Adequate Health Literacy (01/24/2023)     Readmission Risk Interventions     No data to display

## 2023-06-12 NOTE — Progress Notes (Signed)
4 Days Post-Op Robotic R colectomy Subjective: Feels sore, no nausea, having BMs, tolerating reg diet, still having some trouble mobilizing due to joint pain  Objective: Vital signs in last 24 hours: Temp:  [98.3 F (36.8 C)-98.6 F (37 C)] 98.4 F (36.9 C) (12/16 0513) Pulse Rate:  [77-92] 80 (12/16 0513) Resp:  [18] 18 (12/16 0513) BP: (154-187)/(61-70) 154/61 (12/16 0513) SpO2:  [92 %-97 %] 92 % (12/16 0513)   Intake/Output from previous day: 12/15 0701 - 12/16 0700 In: 1260 [P.O.:1260] Out: 300 [Urine:300] Intake/Output this shift: No intake/output data recorded.   General appearance: alert and cooperative GI: normal findings: soft, non-distended, appropriately TTP  Incision: no significant drainage  Lab Results:  Recent Labs    06/10/23 0628 06/11/23 0523  WBC 7.1 7.9  HGB 9.0* 8.9*  HCT 31.1* 29.6*  PLT 178 158   BMET Recent Labs    06/10/23 0628 06/11/23 0523  NA 127* 130*  K 5.3* 5.2*  CL 106 109  CO2 15* 16*  GLUCOSE 144* 110*  BUN 35* 34*  CREATININE 2.75* 2.18*  CALCIUM 8.1* 7.9*   PT/INR No results for input(s): "LABPROT", "INR" in the last 72 hours. ABG No results for input(s): "PHART", "HCO3" in the last 72 hours.  Invalid input(s): "PCO2", "PO2"  MEDS, Scheduled  amLODipine  10 mg Oral Daily   atorvastatin  80 mg Oral Daily   buPROPion  150 mg Oral BID   carvedilol  25 mg Oral BID   DULoxetine  30 mg Oral Daily   feeding supplement  237 mL Oral BID BM   furosemide  20 mg Oral Daily   gabapentin  300 mg Oral BID   heparin injection (subcutaneous)  5,000 Units Subcutaneous Q8H   hydrALAZINE  100 mg Oral BID   hydrochlorothiazide  25 mg Oral Daily   insulin aspart  0-20 Units Subcutaneous TID WC   insulin glargine-yfgn  10 Units Subcutaneous QHS   pantoprazole  40 mg Oral Daily   saccharomyces boulardii  250 mg Oral BID    Studies/Results: No results found.  Assessment: s/p Procedure(s): XI ROBOT ASSISTED LAPAROSCOPIC  PARTIAL COLECTOMY Patient Active Problem List   Diagnosis Date Noted   Colonic mass 06/08/2023   Chronic right shoulder pain 03/28/2023   Neck pain 03/28/2023   Primary osteoarthritis of right shoulder 08/10/2022   CAP (community acquired pneumonia) 06/22/2022   Stage 3b chronic kidney disease (CKD) (HCC) 06/22/2022   Trigger finger, left index finger 12/02/2021   DM type 2 with diabetic peripheral neuropathy (HCC) 07/26/2021   Bilateral low back pain with bilateral sciatica 07/26/2021   Gait abnormality 07/26/2021   Controlled substance agreement signed 12/31/2020   Shoulder stiffness, left 07/07/2020   Status post lumbar spine surgery for decompression of spinal cord 06/09/2020   Lumbar stenosis 05/27/2020   Spinal stenosis of lumbar region 05/21/2019   Complete tear of left rotator cuff 05/16/2019   Uncontrolled type 2 diabetes mellitus with hyperglycemia (HCC) 12/19/2018   Microalbuminuria 04/08/2018   Genetic testing 09/25/2017   Family history of breast cancer    Family history of prostate cancer    Malignant neoplasm of upper-outer quadrant of right breast in female, estrogen receptor positive (HCC) 06/26/2017   Lumbar herniated disc 04/15/2015   Abnormal nuclear stress test 04/03/2015   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   CAD (coronary artery disease), native coronary artery 03/30/2015   S/P mitral valve repair 11/16/2010   Pure hypercholesterolemia 06/29/2010  Diabetes (HCC) 09/29/2006   ANEMIA-IRON DEFICIENCY 09/29/2006   Essential hypertension 09/29/2006    Expected post op course  Plan: Cont reg diet Recheck BMET today Cont PT eval for assistance getting up from sitting Possible d/c later today  LOS: 4 days     .Vanita Panda, MD [facility], [place]    06/12/2023 8:24 AM

## 2023-06-12 NOTE — Progress Notes (Signed)
Physical Therapy Treatment Patient Details Name: [name] MRN: [id number] DOB: Sep 02, 1950 Today's Date: 06/12/2023   History of Present Illness Pt is 72 yo female admitted on 06/08/23 with R colon mass and s/p robotic assisted partial colectomy on 06/08/23.  Pt with hx including but not limited to anemia, arthritis, COPD, HTN, HLD, breast CA, CAD, DM, CVA, CABG, back surgery    PT Comments  Pt  progressing well this session. Pt reports she plans to stay downstairs initially, will sleep in recliner and do sponge baths-1/2 bath downstairs.  Verbally reviewed log roll when return to bed, taking incr time with stairs to second level-havign assist for safety. Reviewed STS for LE strengthening and endurance and safety with rollator/hand breaks.   If plan is discharge home, recommend the following: A little help with walking and/or transfers;A little help with bathing/dressing/bathroom;Assistance with cooking/housework;Help with stairs or ramp for entrance   Can travel by private vehicle        Equipment Recommendations  Rollator (4 wheels)    Recommendations for Other Services       Precautions / Restrictions Precautions Precautions: Fall Restrictions Weight Bearing Restrictions Per Provider Order: No     Mobility  Bed Mobility               General bed mobility comments: received in recliner, verbalizes log roll technique    Transfers Overall transfer level: Needs assistance Equipment used: Rolling walker (2 wheels) Transfers: Sit to/from Stand Sit to Stand: Supervision           General transfer comment: Increased time, educated on scooting forward, anterior wt shift and power up with LEs, no assist to rise    Ambulation/Gait Ambulation/Gait assistance: Supervision Gait Distance (Feet): 180 Feet Assistive device: Rolling walker (2 wheels) Gait Pattern/deviations: Step-through pattern, Wide base of support Gait velocity: decreased but functional      General Gait Details: Good stability with RW; took 3 brief standing rest breaks. denies DOE. wide BOS with bil ELs in external rotation d/t body habitus   Stairs             Wheelchair Mobility     Tilt Bed    Modified Rankin (Stroke Patients Only)       Balance   Sitting-balance support: No upper extremity supported Sitting balance-Leahy Scale: Good     Standing balance support: Bilateral upper extremity supported, No upper extremity supported Standing balance-Leahy Scale: Fair Standing balance comment: RW to ambulate but could stand without support                            Cognition Arousal: Alert Behavior During Therapy: WFL for tasks assessed/performed Overall Cognitive Status: Within Functional Limits for tasks assessed                                          Exercises      General Comments        Pertinent Vitals/Pain Pain Assessment Pain Assessment: Faces Faces Pain Scale: Hurts little more Pain Location: abdomen Pain Descriptors / Indicators: Discomfort Pain Intervention(s): Limited activity within patient's tolerance, Monitored during session, Repositioned    Home Living                          Prior Function  PT Goals (current goals can now be found in the care plan section) Acute Rehab PT Goals Patient Stated Goal: return home PT Goal Formulation: With patient Time For Goal Achievement: 06/23/23 Potential to Achieve Goals: Good Progress towards PT goals: Progressing toward goals    Frequency    Min 1X/week      PT Plan      Co-evaluation              AM-PAC PT "6 Clicks" Mobility   Outcome Measure  Help needed turning from your back to your side while in a flat bed without using bedrails?: A Little Help needed moving from lying on your back to sitting on the side of a flat bed without using bedrails?: None Help needed moving to and from a bed to a chair  (including a wheelchair)?: None Help needed standing up from a chair using your arms (e.g., wheelchair or bedside chair)?: None Help needed to walk in hospital room?: A Little Help needed climbing 3-5 steps with a railing? : A Little 6 Click Score: 21    End of Session Equipment Utilized During Treatment: Gait belt Activity Tolerance: Patient tolerated treatment well Patient left: in chair;with call bell/phone within reach;with family/visitor present Nurse Communication: Mobility status PT Visit Diagnosis: Other abnormalities of gait and mobility (R26.89);Muscle weakness (generalized) (M62.81)     Time: 4403-4742 PT Time Calculation (min) (ACUTE ONLY): 16 min  Charges:    $Gait Training: 8-22 mins PT General Charges $$ ACUTE PT VISIT: 1 Visit                     Solstice Lastinger, PT  Acute Rehab Dept [facility]) [phone]  06/12/2023    [facility] 06/12/2023, 11:47 AM

## 2023-06-12 NOTE — Progress Notes (Signed)
Reviewed written discharge instructions with patient. All questions answered. Patient verbalized understanding. Patient discharged via wheelchair with all belongings. Patient in stable condition.

## 2023-06-12 NOTE — Care Management Important Message (Signed)
Important Message  Patient Details IM Letter given. Name: [name] MRN: [id number] Date of Birth: 11-01-1950   Important Message Given:  Yes - Medicare IM     Caren Macadam 06/12/2023, 12:08 PM

## 2023-06-13 ENCOUNTER — Telehealth: Payer: Self-pay

## 2023-06-13 NOTE — Transitions of Care (Post Inpatient/ED Visit) (Signed)
06/13/2023  Name: [name] MRN: [id number] DOB: 31-Oct-1950  Today's TOC FU Call Status: Today's TOC FU Call Status:: Successful TOC FU Call Completed TOC FU Call Complete Date: 06/13/23 Patient's Name and Date of Birth confirmed.  Transition Care Management Follow-up Telephone Call Date of Discharge: 06/12/23 Discharge Facility: [facility]) Type of Discharge: Inpatient Admission Primary Inpatient Discharge Diagnosis:: "Colonic Mass", underwent laparascopic partial colectomy on 06/08/23 How have you been since you were released from the hospital?: Better Any questions or concerns?: Yes Patient Questions/Concerns:: Patient has a beloved "teacup Yorkie" named Copy who brings her great comfort, he is a pampered indoor dog and does not roam wild outside, only goes out to "do his business" but patient explains that some (not all) of her relatives believe the dog should be separated from her while she recuperates for fear of "catching germs"/getting an infection from the house pet dog. Went over safety issues of being aware of where the little dog is when patient is up walking with the assistance of her 4-wheeled rolling walker, so that she won't trip and fall over the dog, and the dog does not get injured by her walker. Patient verified her dog does not bite and that she has her laparoscopic surgical wound covered at all times and that her dog does not interfere/lick or scratch at her wound. Reassured patient that her beloved teacup Yorkie Everardo All is a valuable source of healing therapy that probably is as effective as her as-needed pain medicine - the patient agreed that her dog Everardo All is her constant companion and provides her a lot of love, support, and distraction from post-op discomfort. Patient Questions/Concerns Addressed: Other: (see above)  Items Reviewed: Did you receive and understand the discharge instructions provided?: Yes Medications obtained,verified, and reconciled?:  Yes (Medications Reviewed) Any new allergies since your discharge?: No Dietary orders reviewed?: Yes Type of Diet Ordered:: light bland diet x 24 hours, drink lots of fluids, do not eat any UNCOOKED fruits or vegetables for next 2 weeks as colon heals, avoid getting constipated using fiber such as Miralax, and mild laxative such as Milk of Magnesia, or Prune juice as needed Do you have support at home?: Yes People in Home: spouse, friend(s), sibling(s), child(ren), adult  Medications Reviewed Today: Medications Reviewed Today     Reviewed by Marcos Eke, RN (Registered Nurse) on 06/13/23 at 1358  Med List Status: <None>   Medication Order Taking? Sig Documenting Provider Last Dose Status Informant  albuterol (VENTOLIN HFA) 108 (90 Base) MCG/ACT inhaler [id number] Yes Inhale 2 puffs into the lungs every 6 (six) hours as needed for wheezing or shortness of breath. Marcine Matar, MD Taking Active Self  amLODipine (NORVASC) 10 MG tablet [id number] Yes TAKE 1 TABLET EVERY DAY Marcine Matar, MD Taking Active Self  aspirin EC 81 MG tablet [id number] Yes Take 1 tablet (81 mg total) by mouth daily. Ambrose Finland, NP Taking Active Self  atorvastatin (LIPITOR) 80 MG tablet [id number] Yes TAKE 1 TABLET EVERY DAY. Marcine Matar, MD Taking Active Self  buPROPion (WELLBUTRIN XL) 150 MG 24 hr tablet [id number] Yes TAKE 1 TABLET TWICE DAILY Marcine Matar, MD Taking Active Self  carvedilol (COREG) 25 MG tablet [id number] Yes TAKE 1 TABLET TWICE DAILY Turner, Cornelious Bryant, MD Taking Active Self  celecoxib (CELEBREX) 50 MG capsule [id number] Yes Take 1 capsule (50 mg total) by mouth 2 (two) times daily as needed for pain. Levert Feinstein, MD Taking Active Self  diclofenac Sodium (VOLTAREN) 1 % GEL [id number] Yes APPLY 2 GRAMS TOPICALLY 4 (FOUR) TIMES DAILY AS NEEDED. Marcine Matar, MD Taking Active Self  docusate sodium (COLACE) 100 MG capsule [id number] Yes Take 100 mg by mouth daily as needed for  mild constipation. [provider] Taking Active Self  DULoxetine (CYMBALTA) 30 MG capsule [id number] Yes Take 1 capsule (30 mg total) by mouth daily. Levert Feinstein, MD Taking Active Self  FARXIGA 10 MG TABS tablet [id number] Yes Take 10 mg by mouth daily. [provider] Taking Active Self  FEROSUL 325 (65 Fe) MG tablet [id number] Yes Take 325 mg by mouth daily. [provider] Taking Active Self  furosemide (LASIX) 20 MG tablet [id number] Yes Take 20 mg by mouth daily. [provider] Taking Active Self  gabapentin (NEURONTIN) 300 MG capsule [id number] Yes Take 1 capsule (300 mg total) by mouth 3 (three) times daily. Levert Feinstein, MD Taking Active Self  hydrALAZINE (APRESOLINE) 100 MG tablet [id number] Yes TAKE 1 TABLET TWICE DAILY Marcine Matar, MD Taking Active Self  HYDROcodone-acetaminophen (NORCO) 5-325 MG tablet [id number] Yes Take 1 tablet by mouth every 6 (six) hours as needed for moderate pain (pain score 4-6). Romie Levee, MD Taking Active   insulin glargine (LANTUS) 100 UNIT/ML Solostar Pen [id number] Yes Inject 10 Units into the skin at bedtime. Marcine Matar, MD Taking Active Self  Insulin Pen Needle (PEN NEEDLES) 31G X 8 MM MISC [id number] Yes UAD Marcine Matar, MD Taking Active Self  letrozole [facility]) 2.5 MG tablet [id number] Yes TAKE 1 TABLET EVERY DAY Serena Croissant, MD Taking Active Self  methocarbamol (ROBAXIN) 500 MG tablet [id number] No TAKE 1 TABLET EVERY 8 HOURS AS NEEDED FOR MUSCLE SPASMS.  Patient not taking: Reported on 06/13/2023   Marcine Matar, MD Not Taking Active Self  Multiple Vitamin (MULTIVITAMIN WITH MINERALS) TABS tablet [id number] Yes Take 1 tablet by mouth daily. [provider] Taking Active Self  pantoprazole (PROTONIX) 40 MG tablet [id number] Yes Take 1 tablet (40 mg total) by mouth daily. Tereso Newcomer T, PA-C Taking Active   traMADol (ULTRAM) 50 MG tablet [id number] No Take 1 tablet (50 mg total) by mouth  every 12 (twelve) hours as needed.  Patient not taking: Reported on 06/13/2023   Marcine Matar, MD Not Taking Active Self  traZODone (DESYREL) 50 MG tablet [id number] Yes TAKE 1 TABLET AT BEDTIME AS NEEDED FOR SLEEP Marcine Matar, MD Taking Active Self  valsartan-hydrochlorothiazide (DIOVAN-HCT) 160-25 MG tablet [id number] Yes Take 1 tablet by mouth daily. Marcine Matar, MD Taking Active Self            Home Care and Equipment/Supplies: Were Home Health Services Ordered?: Yes Name of Home Health Agency:: [facility] Has Agency set up a time to come to your home?: No (Agency to call to set up first appointment) Any new equipment or medical supplies ordered?: Yes Name of Medical supply agency?: [facility] - 4-wheel rolling walker with seat was ordered for pt Do you have any questions related to the use of the equipment/supplies?: No  Functional Questionnaire: Do you need assistance with bathing/showering or dressing?: Yes (supportive family members available to help) Do you need assistance with meal preparation?: Yes (supportive family members available to help w/ meal preparation) Do you need assistance with eating?: No Do you have difficulty maintaining continence: No Do you need assistance with getting out of bed/getting out of a chair/moving?: Yes (supportive family  members available to help) Do you have difficulty managing or taking your medications?: No  Follow up appointments reviewed: PCP Follow-up appointment confirmed?: Yes Date of PCP follow-up appointment?: 07/17/23 Follow-up Provider: Dr. Jonah Blue Specialist [facility] Follow-up appointment confirmed?: Yes Date of Specialist follow-up appointment?: 07/04/23 Follow-Up Specialty Provider:: [facility] follow up/post op visit Do you need transportation to your follow-up appointment?: No Do you understand care options if your condition(s)  worsen?: Yes-patient verbalized understanding  SDOH Interventions Today    Flowsheet Row Most Recent Value  SDOH Interventions   Housing Interventions Intervention Not Indicated      Dear Cleotis Nipper,  Thanks for taking time to talk to me today regarding your recent discharge from [facility]. I will follow up with you again on 06/19/23 @1pm  to see how you are feeling and to check on how you are doing with Home Health Services' help.  Warm Regards,  Franki Monte, RN, BA, [facility], CRRN [facility] Population Health Care Management Coordinator, Transition of Care Ph # [phone]

## 2023-06-14 ENCOUNTER — Encounter: Payer: Medicare PPO | Admitting: Neurology

## 2023-06-14 DIAGNOSIS — K219 Gastro-esophageal reflux disease without esophagitis: Secondary | ICD-10-CM | POA: Diagnosis not present

## 2023-06-14 DIAGNOSIS — I129 Hypertensive chronic kidney disease with stage 1 through stage 4 chronic kidney disease, or unspecified chronic kidney disease: Secondary | ICD-10-CM | POA: Diagnosis not present

## 2023-06-14 DIAGNOSIS — Z48815 Encounter for surgical aftercare following surgery on the digestive system: Secondary | ICD-10-CM | POA: Diagnosis not present

## 2023-06-14 DIAGNOSIS — E1122 Type 2 diabetes mellitus with diabetic chronic kidney disease: Secondary | ICD-10-CM | POA: Diagnosis not present

## 2023-06-14 DIAGNOSIS — N1832 Chronic kidney disease, stage 3b: Secondary | ICD-10-CM | POA: Diagnosis not present

## 2023-06-14 DIAGNOSIS — E1142 Type 2 diabetes mellitus with diabetic polyneuropathy: Secondary | ICD-10-CM | POA: Diagnosis not present

## 2023-06-14 DIAGNOSIS — D631 Anemia in chronic kidney disease: Secondary | ICD-10-CM | POA: Diagnosis not present

## 2023-06-14 DIAGNOSIS — Z794 Long term (current) use of insulin: Secondary | ICD-10-CM | POA: Diagnosis not present

## 2023-06-14 DIAGNOSIS — J449 Chronic obstructive pulmonary disease, unspecified: Secondary | ICD-10-CM | POA: Diagnosis not present

## 2023-06-16 DIAGNOSIS — D631 Anemia in chronic kidney disease: Secondary | ICD-10-CM | POA: Diagnosis not present

## 2023-06-16 DIAGNOSIS — N1832 Chronic kidney disease, stage 3b: Secondary | ICD-10-CM | POA: Diagnosis not present

## 2023-06-16 DIAGNOSIS — Z794 Long term (current) use of insulin: Secondary | ICD-10-CM | POA: Diagnosis not present

## 2023-06-16 DIAGNOSIS — K219 Gastro-esophageal reflux disease without esophagitis: Secondary | ICD-10-CM | POA: Diagnosis not present

## 2023-06-16 DIAGNOSIS — I129 Hypertensive chronic kidney disease with stage 1 through stage 4 chronic kidney disease, or unspecified chronic kidney disease: Secondary | ICD-10-CM | POA: Diagnosis not present

## 2023-06-16 DIAGNOSIS — Z48815 Encounter for surgical aftercare following surgery on the digestive system: Secondary | ICD-10-CM | POA: Diagnosis not present

## 2023-06-16 DIAGNOSIS — J449 Chronic obstructive pulmonary disease, unspecified: Secondary | ICD-10-CM | POA: Diagnosis not present

## 2023-06-16 DIAGNOSIS — E1122 Type 2 diabetes mellitus with diabetic chronic kidney disease: Secondary | ICD-10-CM | POA: Diagnosis not present

## 2023-06-16 DIAGNOSIS — E1142 Type 2 diabetes mellitus with diabetic polyneuropathy: Secondary | ICD-10-CM | POA: Diagnosis not present

## 2023-06-19 ENCOUNTER — Other Ambulatory Visit: Payer: Self-pay

## 2023-06-19 ENCOUNTER — Telehealth: Payer: Self-pay

## 2023-06-19 NOTE — Patient Instructions (Signed)
Visit Information  Thank you for taking time to visit with me today. Please don't hesitate to contact me if I can be of assistance to you before our next scheduled telephone appointment.  Our next appointment is by telephone on 06/26/23 at 1pm  Following is a copy of your care plan:   Goals Addressed             This Visit's Progress    Transition of Care       Current Barriers:  Knowledge Deficits related to plan of care for management of post operative pain, recuperation needs & return to previous level of physical functioning after undergoing a laparoscopic partial colectomy to remove a colonic mass.  RNCM Clinical Goal(s):  Patient will work with the Care Management team over the next 30 days to address Transition of Care Barriers: Medication Management Diet/Nutrition/Food Resources Support at home Provider appointments Home Health services Equipment/DME Functional/Safety through collaboration with RN Care manager, provider, and care team.   Interventions: Evaluation of current treatment plan related to  self management and patient's adherence to plan as established by provider   Pain Interventions:  (Status:  Ongoing) Short Term Goal Pain assessment performed Medications reviewed Reviewed provider established plan for pain management Discussed importance of adherence to all scheduled medical appointments Advised patient to report to care team affect of pain on daily activities Discussed use of relaxation techniques and/or diversional activities to assist with pain reduction (distraction, imagery, relaxation, massage, acupressure, TENS, heat, and cold application Reviewed with patient prescribed pharmacological and nonpharmacological pain relief strategies Assessed social determinant of health barriers   Surgery (Laparoscopic partial colectomy):  (Status: Ongoing) Short Term Goal Evaluation of current treatment plan related to Colon surgery assessed patient/caregiver  understanding of surgical procedure   reviewed post-operative instructions with patient/caregiver addressed questions about post - surgical incision care  reviewed medications with patient and addressed questions reviewed scheduled provider appointments with patientHas Post operative follow up with Romie Levee on 07/04/23 @ 11:30am confirmed availability of transportation to all appointments Patient does not have any transportation challenges getting to appointments  Patient Goals/Self-Care Activities: Participate in Transition of Care Program/Attend TOC scheduled calls Take all medications as prescribed Attend all scheduled provider appointments Call pharmacy for medication refills 3-7 days in advance of running out of medications Perform all self care activities independently  Call provider office for new concerns or questions   Follow Up Plan:  The patient has been provided with contact information for the care management team and has been advised to call with any health related questions or concerns.          Patient verbalizes understanding of instructions and care plan provided today and agrees to view in MyChart. Active MyChart status and patient understanding of how to access instructions and care plan via MyChart confirmed with patient.     The patient has been provided with contact information for the care management team and has been advised to call with any health related questions or concerns.   Please call the care guide team at [phone] if you need to cancel or reschedule your appointment.   Please call 1-800-273-TALK (toll free, 24 hour hotline) if you are experiencing a Mental Health or Behavioral Health Crisis or need someone to talk to.  Alyse Low, RN, BA, [facility], CRRN [facility] Coordinator, Transition of Care Ph # [phone]

## 2023-06-19 NOTE — Patient Outreach (Signed)
Care Management  Transitions of Care Program Transitions of Care Post-discharge week 2   06/19/2023 Name: [name] MRN: [id number] DOB: 1950-10-17  Subjective: [name] is a 72 y.o. year old female who is a primary care patient of Marcine Matar, MD. The Care Management team Engaged with patient Engaged with patient by telephone to assess and address transitions of care needs.   Consent to Services:  Patient was given information about care management services, agreed to services, and gave verbal consent to participate.   Assessment:           SDOH Interventions    Flowsheet Row Telephone from 06/13/2023 in [place] POPULATION HEALTH DEPARTMENT Clinical Support from 01/24/2023 in [facility] Health Comm Health [place] - A Dept Of [place]. [facility] Telephone from 06/28/2022 in [facility] Community Care Coordination Nutrition from 05/24/2021 in [facility] Nutr Diab Ed  - A Dept Of [place]. [facility]  SDOH Interventions      Food Insecurity Interventions -- Intervention Not Indicated Intervention Not Indicated Intervention Not Indicated  Housing Interventions Intervention Not Indicated Intervention Not Indicated -- --  Transportation Interventions -- Intervention Not Indicated Intervention Not Indicated --  Utilities Interventions -- Intervention Not Indicated -- --  Alcohol Usage Interventions -- Intervention Not Indicated (Score <7) -- --  Financial Strain Interventions -- Intervention Not Indicated -- --  Physical Activity Interventions -- Intervention Not Indicated -- --  Stress Interventions -- Intervention Not Indicated -- --  Social Connections Interventions -- Intervention Not Indicated -- --  Health Literacy Interventions -- Intervention Not Indicated -- --        Goals Addressed             This Visit's Progress    Transition of Care       Current Barriers:  Knowledge Deficits related to plan of care for  management of post operative pain, recuperation needs & return to previous level of physical functioning after undergoing a laparoscopic partial colectomy to remove a colonic mass.  RNCM Clinical Goal(s):  Patient will work with the Care Management team over the next 30 days to address Transition of Care Barriers: Medication Management Diet/Nutrition/Food Resources Support at home Provider appointments Home Health services Equipment/DME Functional/Safety through collaboration with RN Care manager, provider, and care team.   Interventions: Evaluation of current treatment plan related to  self management and patient's adherence to plan as established by provider   Pain Interventions:  (Status:  Ongoing) Short Term Goal Pain assessment performed Medications reviewed Reviewed provider established plan for pain management Discussed importance of adherence to all scheduled medical appointments Advised patient to report to care team affect of pain on daily activities Discussed use of relaxation techniques and/or diversional activities to assist with pain reduction (distraction, imagery, relaxation, massage, acupressure, TENS, heat, and cold application Reviewed with patient prescribed pharmacological and nonpharmacological pain relief strategies Assessed social determinant of health barriers   Surgery (Laparoscopic partial colectomy):  (Status: Ongoing) Short Term Goal Evaluation of current treatment plan related to Colon surgery assessed patient/caregiver understanding of surgical procedure   reviewed post-operative instructions with patient/caregiver addressed questions about post - surgical incision care  reviewed medications with patient and addressed questions reviewed scheduled provider appointments with patientHas Post operative follow up with Romie Levee on 07/04/23 @ 11:30am confirmed availability of transportation to all appointments Patient does not have any transportation  challenges getting to appointments  Patient Goals/Self-Care Activities: Participate in Transition  of Care Program/Attend TOC scheduled calls Take all medications as prescribed Attend all scheduled provider appointments Call pharmacy for medication refills 3-7 days in advance of running out of medications Perform all self care activities independently  Call provider office for new concerns or questions   Follow Up Plan:  The patient has been provided with contact information for the care management team and has been advised to call with any health related questions or concerns.          Plan: The patient has been provided with contact information for the care management team and has been advised to call with any health related questions or concerns.   Alyse Low, RN, BA, [facility], CRRN [facility] Coordinator, Transition of Care Ph # [phone]

## 2023-06-20 DIAGNOSIS — E1142 Type 2 diabetes mellitus with diabetic polyneuropathy: Secondary | ICD-10-CM | POA: Diagnosis not present

## 2023-06-20 DIAGNOSIS — N1832 Chronic kidney disease, stage 3b: Secondary | ICD-10-CM | POA: Diagnosis not present

## 2023-06-20 DIAGNOSIS — Z48815 Encounter for surgical aftercare following surgery on the digestive system: Secondary | ICD-10-CM | POA: Diagnosis not present

## 2023-06-20 DIAGNOSIS — D631 Anemia in chronic kidney disease: Secondary | ICD-10-CM | POA: Diagnosis not present

## 2023-06-20 DIAGNOSIS — E1122 Type 2 diabetes mellitus with diabetic chronic kidney disease: Secondary | ICD-10-CM | POA: Diagnosis not present

## 2023-06-20 DIAGNOSIS — I129 Hypertensive chronic kidney disease with stage 1 through stage 4 chronic kidney disease, or unspecified chronic kidney disease: Secondary | ICD-10-CM | POA: Diagnosis not present

## 2023-06-20 DIAGNOSIS — J449 Chronic obstructive pulmonary disease, unspecified: Secondary | ICD-10-CM | POA: Diagnosis not present

## 2023-06-20 DIAGNOSIS — Z794 Long term (current) use of insulin: Secondary | ICD-10-CM | POA: Diagnosis not present

## 2023-06-20 DIAGNOSIS — K219 Gastro-esophageal reflux disease without esophagitis: Secondary | ICD-10-CM | POA: Diagnosis not present

## 2023-06-26 ENCOUNTER — Inpatient Hospital Stay (HOSPITAL_COMMUNITY)
Admission: EM | Admit: 2023-06-26 | Discharge: 2023-06-29 | DRG: 193 | Disposition: A | Discharge status: Home / Self Care | Payer: Medicare PPO | Attending: Student | Admitting: Student

## 2023-06-26 ENCOUNTER — Ambulatory Visit: Payer: Self-pay | Admitting: *Deleted

## 2023-06-26 ENCOUNTER — Other Ambulatory Visit: Payer: Self-pay

## 2023-06-26 ENCOUNTER — Emergency Department (HOSPITAL_COMMUNITY): Payer: Medicare PPO

## 2023-06-26 ENCOUNTER — Telehealth: Payer: Self-pay

## 2023-06-26 DIAGNOSIS — Z923 Personal history of irradiation: Secondary | ICD-10-CM

## 2023-06-26 DIAGNOSIS — R1030 Lower abdominal pain, unspecified: Secondary | ICD-10-CM | POA: Diagnosis present

## 2023-06-26 DIAGNOSIS — I2510 Atherosclerotic heart disease of native coronary artery without angina pectoris: Secondary | ICD-10-CM | POA: Diagnosis present

## 2023-06-26 DIAGNOSIS — D638 Anemia in other chronic diseases classified elsewhere: Secondary | ICD-10-CM | POA: Diagnosis present

## 2023-06-26 DIAGNOSIS — G4733 Obstructive sleep apnea (adult) (pediatric): Secondary | ICD-10-CM | POA: Diagnosis present

## 2023-06-26 DIAGNOSIS — E1165 Type 2 diabetes mellitus with hyperglycemia: Secondary | ICD-10-CM | POA: Diagnosis present

## 2023-06-26 DIAGNOSIS — Z860100 Personal history of colon polyps, unspecified: Secondary | ICD-10-CM

## 2023-06-26 DIAGNOSIS — E1122 Type 2 diabetes mellitus with diabetic chronic kidney disease: Secondary | ICD-10-CM | POA: Diagnosis present

## 2023-06-26 DIAGNOSIS — R0602 Shortness of breath: Secondary | ICD-10-CM | POA: Diagnosis not present

## 2023-06-26 DIAGNOSIS — N1832 Chronic kidney disease, stage 3b: Secondary | ICD-10-CM | POA: Diagnosis present

## 2023-06-26 DIAGNOSIS — R0902 Hypoxemia: Secondary | ICD-10-CM

## 2023-06-26 DIAGNOSIS — Z1152 Encounter for screening for COVID-19: Secondary | ICD-10-CM | POA: Diagnosis not present

## 2023-06-26 DIAGNOSIS — J9601 Acute respiratory failure with hypoxia: Secondary | ICD-10-CM | POA: Diagnosis not present

## 2023-06-26 DIAGNOSIS — Z803 Family history of malignant neoplasm of breast: Secondary | ICD-10-CM

## 2023-06-26 DIAGNOSIS — J189 Pneumonia, unspecified organism: Secondary | ICD-10-CM | POA: Diagnosis not present

## 2023-06-26 DIAGNOSIS — D631 Anemia in chronic kidney disease: Secondary | ICD-10-CM | POA: Diagnosis present

## 2023-06-26 DIAGNOSIS — Z800 Family history of malignant neoplasm of digestive organs: Secondary | ICD-10-CM

## 2023-06-26 DIAGNOSIS — Z8614 Personal history of Methicillin resistant Staphylococcus aureus infection: Secondary | ICD-10-CM

## 2023-06-26 DIAGNOSIS — Z90710 Acquired absence of both cervix and uterus: Secondary | ICD-10-CM

## 2023-06-26 DIAGNOSIS — Z8042 Family history of malignant neoplasm of prostate: Secondary | ICD-10-CM

## 2023-06-26 DIAGNOSIS — Z8379 Family history of other diseases of the digestive system: Secondary | ICD-10-CM

## 2023-06-26 DIAGNOSIS — Z823 Family history of stroke: Secondary | ICD-10-CM

## 2023-06-26 DIAGNOSIS — Z87891 Personal history of nicotine dependence: Secondary | ICD-10-CM | POA: Diagnosis not present

## 2023-06-26 DIAGNOSIS — K219 Gastro-esophageal reflux disease without esophagitis: Secondary | ICD-10-CM | POA: Diagnosis present

## 2023-06-26 DIAGNOSIS — Z951 Presence of aortocoronary bypass graft: Secondary | ICD-10-CM

## 2023-06-26 DIAGNOSIS — R188 Other ascites: Secondary | ICD-10-CM | POA: Diagnosis not present

## 2023-06-26 DIAGNOSIS — E785 Hyperlipidemia, unspecified: Secondary | ICD-10-CM | POA: Diagnosis present

## 2023-06-26 DIAGNOSIS — Z794 Long term (current) use of insulin: Secondary | ICD-10-CM

## 2023-06-26 DIAGNOSIS — R7989 Other specified abnormal findings of blood chemistry: Secondary | ICD-10-CM | POA: Diagnosis not present

## 2023-06-26 DIAGNOSIS — T8140XA Infection following a procedure, unspecified, initial encounter: Secondary | ICD-10-CM | POA: Diagnosis not present

## 2023-06-26 DIAGNOSIS — Z8349 Family history of other endocrine, nutritional and metabolic diseases: Secondary | ICD-10-CM

## 2023-06-26 DIAGNOSIS — J168 Pneumonia due to other specified infectious organisms: Secondary | ICD-10-CM | POA: Diagnosis not present

## 2023-06-26 DIAGNOSIS — I2489 Other forms of acute ischemic heart disease: Secondary | ICD-10-CM | POA: Diagnosis present

## 2023-06-26 DIAGNOSIS — M7989 Other specified soft tissue disorders: Secondary | ICD-10-CM | POA: Diagnosis not present

## 2023-06-26 DIAGNOSIS — Z7982 Long term (current) use of aspirin: Secondary | ICD-10-CM

## 2023-06-26 DIAGNOSIS — Z8673 Personal history of transient ischemic attack (TIA), and cerebral infarction without residual deficits: Secondary | ICD-10-CM

## 2023-06-26 DIAGNOSIS — D509 Iron deficiency anemia, unspecified: Secondary | ICD-10-CM | POA: Diagnosis present

## 2023-06-26 DIAGNOSIS — Z841 Family history of disorders of kidney and ureter: Secondary | ICD-10-CM

## 2023-06-26 DIAGNOSIS — I5032 Chronic diastolic (congestive) heart failure: Secondary | ICD-10-CM | POA: Diagnosis not present

## 2023-06-26 DIAGNOSIS — R109 Unspecified abdominal pain: Secondary | ICD-10-CM

## 2023-06-26 DIAGNOSIS — Z833 Family history of diabetes mellitus: Secondary | ICD-10-CM | POA: Diagnosis not present

## 2023-06-26 DIAGNOSIS — I130 Hypertensive heart and chronic kidney disease with heart failure and stage 1 through stage 4 chronic kidney disease, or unspecified chronic kidney disease: Secondary | ICD-10-CM | POA: Diagnosis present

## 2023-06-26 DIAGNOSIS — E1142 Type 2 diabetes mellitus with diabetic polyneuropathy: Secondary | ICD-10-CM | POA: Diagnosis present

## 2023-06-26 DIAGNOSIS — R918 Other nonspecific abnormal finding of lung field: Secondary | ICD-10-CM | POA: Diagnosis not present

## 2023-06-26 DIAGNOSIS — Z809 Family history of malignant neoplasm, unspecified: Secondary | ICD-10-CM

## 2023-06-26 DIAGNOSIS — Z91199 Patient's noncompliance with other medical treatment and regimen due to unspecified reason: Secondary | ICD-10-CM

## 2023-06-26 DIAGNOSIS — Z79899 Other long term (current) drug therapy: Secondary | ICD-10-CM

## 2023-06-26 DIAGNOSIS — I700 Atherosclerosis of aorta: Secondary | ICD-10-CM | POA: Diagnosis not present

## 2023-06-26 DIAGNOSIS — Z6839 Body mass index (BMI) 39.0-39.9, adult: Secondary | ICD-10-CM

## 2023-06-26 DIAGNOSIS — Z853 Personal history of malignant neoplasm of breast: Secondary | ICD-10-CM

## 2023-06-26 LAB — CBC
HCT: 28.8 % — ABNORMAL LOW (ref 36.0–46.0)
Hemoglobin: 8.6 g/dL — ABNORMAL LOW (ref 12.0–15.0)
MCH: 24.3 pg — ABNORMAL LOW (ref 26.0–34.0)
MCHC: 29.9 g/dL — ABNORMAL LOW (ref 30.0–36.0)
MCV: 81.4 fL (ref 80.0–100.0)
Platelets: 280 10*3/uL (ref 150–400)
RBC: 3.54 MIL/uL — ABNORMAL LOW (ref 3.87–5.11)
RDW: 16.5 % — ABNORMAL HIGH (ref 11.5–15.5)
WBC: 6.6 10*3/uL (ref 4.0–10.5)
nRBC: 0 % (ref 0.0–0.2)

## 2023-06-26 LAB — CBC WITH DIFFERENTIAL/PLATELET
Abs Immature Granulocytes: 0.04 10*3/uL (ref 0.00–0.07)
Basophils Absolute: 0.1 10*3/uL (ref 0.0–0.1)
Basophils Relative: 1 %
Eosinophils Absolute: 0 10*3/uL (ref 0.0–0.5)
Eosinophils Relative: 0 %
HCT: 31.7 % — ABNORMAL LOW (ref 36.0–46.0)
Hemoglobin: 9.7 g/dL — ABNORMAL LOW (ref 12.0–15.0)
Immature Granulocytes: 1 %
Lymphocytes Relative: 14 %
Lymphs Abs: 1 10*3/uL (ref 0.7–4.0)
MCH: 24.4 pg — ABNORMAL LOW (ref 26.0–34.0)
MCHC: 30.6 g/dL (ref 30.0–36.0)
MCV: 79.6 fL — ABNORMAL LOW (ref 80.0–100.0)
Monocytes Absolute: 0.5 10*3/uL (ref 0.1–1.0)
Monocytes Relative: 6 %
Neutro Abs: 5.8 10*3/uL (ref 1.7–7.7)
Neutrophils Relative %: 78 %
Platelets: 300 10*3/uL (ref 150–400)
RBC: 3.98 MIL/uL (ref 3.87–5.11)
RDW: 16.4 % — ABNORMAL HIGH (ref 11.5–15.5)
WBC: 7.4 10*3/uL (ref 4.0–10.5)
nRBC: 0 % (ref 0.0–0.2)

## 2023-06-26 LAB — BASIC METABOLIC PANEL
Anion gap: 8 (ref 5–15)
BUN: 19 mg/dL (ref 8–23)
CO2: 25 mmol/L (ref 22–32)
Calcium: 9.1 mg/dL (ref 8.9–10.3)
Chloride: 106 mmol/L (ref 98–111)
Creatinine, Ser: 1.41 mg/dL — ABNORMAL HIGH (ref 0.44–1.00)
GFR, Estimated: 39 mL/min — ABNORMAL LOW (ref >60)
Glucose, Bld: 103 mg/dL — ABNORMAL HIGH (ref 70–99)
Potassium: 4.4 mmol/L (ref 3.5–5.1)
Sodium: 139 mmol/L (ref 135–145)

## 2023-06-26 LAB — I-STAT CG4 LACTIC ACID, ED
Lactic Acid, Venous: 0.5 mmol/L (ref 0.5–1.9)
Lactic Acid, Venous: 0.7 mmol/L (ref 0.5–1.9)

## 2023-06-26 LAB — TROPONIN I (HIGH SENSITIVITY)
Troponin I (High Sensitivity): 57 ng/L — ABNORMAL HIGH (ref <18)
Troponin I (High Sensitivity): 49 ng/L — ABNORMAL HIGH (ref <18)

## 2023-06-26 LAB — CREATININE, SERUM
Creatinine, Ser: 1.57 mg/dL — ABNORMAL HIGH (ref 0.44–1.00)
GFR, Estimated: 34 mL/min — ABNORMAL LOW (ref >60)

## 2023-06-26 LAB — RESP PANEL BY RT-PCR (RSV, FLU A&B, COVID)  RVPGX2
Influenza A by PCR: NEGATIVE
Influenza B by PCR: NEGATIVE
Resp Syncytial Virus by PCR: NEGATIVE
SARS Coronavirus 2 by RT PCR: NEGATIVE

## 2023-06-26 MED ORDER — PIPERACILLIN-TAZOBACTAM 3.375 G IVPB 30 MIN
3.3750 g | Freq: Once | INTRAVENOUS | Status: AC
  Administered 2023-06-26: 3.375 g via INTRAVENOUS
  Filled 2023-06-26: qty 50

## 2023-06-26 MED ORDER — ENOXAPARIN SODIUM 60 MG/0.6ML IJ SOSY
60.0000 mg | PREFILLED_SYRINGE | INTRAMUSCULAR | Status: DC
  Administered 2023-06-27 – 2023-06-29 (×3): 60 mg via SUBCUTANEOUS
  Filled 2023-06-26 (×4): qty 0.6

## 2023-06-26 MED ORDER — POLYETHYLENE GLYCOL 3350 17 G PO PACK: 17.0000 g | PACK | Freq: Every day | ORAL | Status: DC | PRN

## 2023-06-26 MED ORDER — DIPHENHYDRAMINE HCL 50 MG/ML IJ SOLN
12.5000 mg | INTRAMUSCULAR | Status: AC
  Administered 2023-06-26: 12.5 mg via INTRAVENOUS
  Filled 2023-06-26: qty 1

## 2023-06-26 MED ORDER — PROCHLORPERAZINE EDISYLATE 10 MG/2ML IJ SOLN: 5.0000 mg | Freq: Four times a day (QID) | INTRAMUSCULAR | Status: DC | PRN

## 2023-06-26 MED ORDER — ACETAMINOPHEN 500 MG PO TABS: 500.0000 mg | ORAL_TABLET | Freq: Four times a day (QID) | ORAL | Status: DC | PRN

## 2023-06-26 MED ORDER — CARVEDILOL 25 MG PO TABS
25.0000 mg | ORAL_TABLET | Freq: Two times a day (BID) | ORAL | Status: DC
  Administered 2023-06-27 – 2023-06-29 (×6): 25 mg via ORAL
  Filled 2023-06-26: qty 2
  Filled 2023-06-26 (×2): qty 1
  Filled 2023-06-26: qty 2
  Filled 2023-06-26 (×2): qty 1

## 2023-06-26 MED ORDER — OXYCODONE HCL 5 MG PO TABS: 5.0000 mg | ORAL_TABLET | Freq: Four times a day (QID) | ORAL | Status: DC | PRN

## 2023-06-26 MED ORDER — METOCLOPRAMIDE HCL 5 MG/ML IJ SOLN
5.0000 mg | INTRAMUSCULAR | Status: AC
  Administered 2023-06-26: 5 mg via INTRAVENOUS
  Filled 2023-06-26: qty 2

## 2023-06-26 MED ORDER — GUAIFENESIN ER 600 MG PO TB12
600.0000 mg | ORAL_TABLET | Freq: Two times a day (BID) | ORAL | Status: DC
  Administered 2023-06-27 – 2023-06-28 (×4): 600 mg via ORAL
  Filled 2023-06-26 (×4): qty 1

## 2023-06-26 MED ORDER — MELATONIN 5 MG PO TABS: 5.0000 mg | ORAL_TABLET | Freq: Every evening | ORAL | Status: DC | PRN

## 2023-06-26 MED ORDER — KETOROLAC TROMETHAMINE 15 MG/ML IJ SOLN
15.0000 mg | INTRAMUSCULAR | Status: AC
  Administered 2023-06-26: 15 mg via INTRAVENOUS
  Filled 2023-06-26: qty 1

## 2023-06-26 MED ORDER — IPRATROPIUM-ALBUTEROL 0.5-2.5 (3) MG/3ML IN SOLN
3.0000 mL | Freq: Once | RESPIRATORY_TRACT | Status: AC
  Administered 2023-06-26: 3 mL via RESPIRATORY_TRACT
  Filled 2023-06-26: qty 3

## 2023-06-26 MED ORDER — IOHEXOL 300 MG/ML  SOLN
80.0000 mL | Freq: Once | INTRAMUSCULAR | Status: AC | PRN
  Administered 2023-06-26: 80 mL via INTRAVENOUS

## 2023-06-26 MED ORDER — METHYLPREDNISOLONE SODIUM SUCC 125 MG IJ SOLR
125.0000 mg | Freq: Once | INTRAMUSCULAR | Status: AC
  Administered 2023-06-26: 125 mg via INTRAVENOUS
  Filled 2023-06-26: qty 2

## 2023-06-26 MED ORDER — ALBUTEROL SULFATE HFA 108 (90 BASE) MCG/ACT IN AERS
2.0000 | INHALATION_SPRAY | RESPIRATORY_TRACT | Status: DC | PRN
  Administered 2023-06-26: 2 via RESPIRATORY_TRACT
  Filled 2023-06-26: qty 6.7

## 2023-06-26 NOTE — ED Provider Triage Note (Signed)
Emergency Medicine Provider Triage Evaluation Note  [name] , a 72 y.o. female  was evaluated in triage.  Pt complains of cough, shortness of breath, malaise, myalgias.  Present for 2 days.  Was told by urgent care she may have pneumonia.  No fevers.  Cough productive of yellow mucus.  Reports history of COPD  Review of Systems  Positive: As above Negative: As above  Physical Exam  BP (!) 168/103 (BP Location: Left Arm)   Pulse 94   Temp 98.3 F (36.8 C) (Oral)   Resp (!) 25   Ht 5\' 8"  (1.727 m)   Wt 117.9 kg   SpO2 94%   BMI 39.53 kg/m  Gen:   Awake, no distress   Resp:  Normal effort  MSK:   Moves extremities without difficulty  Other:    Medical Decision Making  Medically screening exam initiated at 2:34 PM.  Appropriate orders placed.  [name] was informed that the remainder of the evaluation will be completed by another provider, this initial triage assessment does not replace that evaluation, and the importance of remaining in the ED until their evaluation is complete.  workup initiated   Michelle Piper, Cordelia Poche 06/26/23 1435

## 2023-06-26 NOTE — ED Notes (Signed)
Pt o2 sat 84% upon rounding on pt, pt c/o headache denies shortness of breath, placed on 4L [place] to maintain sat >94%

## 2023-06-26 NOTE — Progress Notes (Signed)
ED Pharmacy Antibiotic Sign Off An antibiotic consult was received from an ED provider for IAI per pharmacy dosing for zosyn. A chart review was completed to assess appropriateness.   The following one time order(s) were placed:  Zosyn 3.375 gm IV x 1 dose over 30 minutes  Further antibiotic and/or antibiotic pharmacy consults should be ordered by the admitting provider if indicated.   Thank you for allowing pharmacy to be a part of this patient's care.   Herby Abraham, Pharm.D Use secure chat for questions 06/26/2023 8:29 PM Clinical Pharmacist 06/26/23 8:29 PM

## 2023-06-26 NOTE — Patient Outreach (Signed)
  Care Management  Transitions of Care Program Transitions of Care Post-discharge week 3   06/26/2023 Name: [name] MRN: [id number] DOB: 10-07-1950  Subjective: [name] is a 72 y.o. year old female who is a primary care patient of Marcine Matar, MD. The Care Management team Engaged with patient Engaged with patient by telephone to assess and address transitions of care needs.   Consent to Services:  Patient was given information about care management services, agreed to services, and gave verbal consent to participate.   Assessment:  Patient complaining of increased shortness of breath, increased fatigue and weakness, patient noted to have increased shortness of breath while describing symptoms to this nurse. She called her PCP's office who informed her no available appt until march 2025 and encouraged her to go to Urgent Care - went to Urgent Care but UC urged patient to seek care in Emergency Department.Spoke to both patient and her husband and urged them to have patient assessed at nearest hospital - husband presently getting ready to drive patient to [facility] ED and stated he would call me later. Will follow up if I do not hear from husband later.          Plan to follow up with patient/family later to see how patient is feeling, check on whether she was admitted or sent home.  Alyse Low, RN, BA, [facility], CRRN [facility] Coordinator, Transition of Care Ph # [phone]

## 2023-06-26 NOTE — ED Notes (Signed)
Pt ambulated to restroom with cane.

## 2023-06-26 NOTE — Telephone Encounter (Signed)
Agree with disposition. 

## 2023-06-26 NOTE — Telephone Encounter (Signed)
  Chief Complaint: SOB, cough, weakness Symptoms: cough, headache, weakness, shortness of breath unable to lay down hx COPD Frequency: last night  Pertinent Negatives: Patient denies chest pain  Disposition: [] ED /[x] Urgent Care (no appt availability in office) / [] Appointment(In office/virtual)/ []  [facility]/ [] Home Care/ [] Refused Recommended Disposition /[] [place] [facility]/ []  Follow-up with PCP Additional Notes:   Recommended UC or ED. No available appt. Until March       Reason for Disposition  [1] MILD difficulty breathing (e.g., minimal/no SOB at rest, SOB with walking, pulse <100) AND [2] NEW-onset or WORSE than normal  Answer Assessment - Initial Assessment Questions 1. RESPIRATORY STATUS: "Describe your breathing?" (e.g., wheezing, shortness of breath, unable to speak, severe coughing)      Shortness of breath , cough headache chills  2. ONSET: "When did this breathing problem begin?"      Last night  3. PATTERN "Does the difficult breathing come and go, or has it been constant since it started?"      Comes and goes  4. SEVERITY: "How bad is your breathing?" (e.g., mild, moderate, severe)    - MILD: No SOB at rest, mild SOB with walking, speaks normally in sentences, can lie down, no retractions, pulse < 100.    - MODERATE: SOB at rest, SOB with minimal exertion and prefers to sit, cannot lie down flat, speaks in phrases, mild retractions, audible wheezing, pulse 100-120.    - SEVERE: Very SOB at rest, speaks in single words, struggling to breathe, sitting hunched forward, retractions, pulse > 120      SOB with coughing unable to lay down due to SOB 5. RECURRENT SYMPTOM: "Have you had difficulty breathing before?" If Yes, ask: "When was the last time?" and "What happened that time?"      Na  6. CARDIAC HISTORY: "Do you have any history of heart disease?" (e.g., heart attack, angina, bypass surgery, angioplasty)     Hx heart surgery  7. LUNG HISTORY: "Do  you have any history of lung disease?"  (e.g., pulmonary embolus, asthma, emphysema)     Hx COPD 8. CAUSE: "What do you think is causing the breathing problem?"      na 9. OTHER SYMPTOMS: "Do you have any other symptoms? (e.g., dizziness, runny nose, cough, chest pain, fever)     Cough , SOB dizziness, headache, weakness 10. O2 SATURATION MONITOR:  "Do you use an oxygen saturation monitor (pulse oximeter) at home?" If Yes, ask: "What is your reading (oxygen level) today?" "What is your usual oxygen saturation reading?" (e.g., 95%)       na 11. PREGNANCY: "Is there any chance you are pregnant?" "When was your last menstrual period?"       na 12. TRAVEL: "Have you traveled out of the country in the last month?" (e.g., travel history, exposures)       na  Protocols used: Breathing Difficulty-A-AH

## 2023-06-26 NOTE — ED Triage Notes (Signed)
Patient brought in by POV from urgent care for possible pneumonia. States she has been having increased weakness and SOB, HX of COPD. Denies fever and nausea but voiced vomiting and diarrhea since yesterday.

## 2023-06-27 ENCOUNTER — Inpatient Hospital Stay (HOSPITAL_COMMUNITY): Payer: Medicare PPO

## 2023-06-27 ENCOUNTER — Encounter (HOSPITAL_COMMUNITY): Payer: Self-pay | Admitting: Internal Medicine

## 2023-06-27 ENCOUNTER — Telehealth: Payer: Self-pay

## 2023-06-27 DIAGNOSIS — J9601 Acute respiratory failure with hypoxia: Secondary | ICD-10-CM

## 2023-06-27 DIAGNOSIS — M7989 Other specified soft tissue disorders: Secondary | ICD-10-CM | POA: Diagnosis not present

## 2023-06-27 DIAGNOSIS — R7989 Other specified abnormal findings of blood chemistry: Secondary | ICD-10-CM

## 2023-06-27 DIAGNOSIS — I5032 Chronic diastolic (congestive) heart failure: Secondary | ICD-10-CM

## 2023-06-27 DIAGNOSIS — J189 Pneumonia, unspecified organism: Secondary | ICD-10-CM | POA: Diagnosis not present

## 2023-06-27 LAB — ECHOCARDIOGRAM COMPLETE
AR max vel: 2.3 cm2
AV Area VTI: 2.7 cm2
AV Area mean vel: 2.4 cm2
AV Mean grad: 7 mm[Hg]
AV Peak grad: 13.2 mm[Hg]
Ao pk vel: 1.82 m/s
Area-P 1/2: 3.02 cm2
Height: 68 in
MV M vel: 4.71 m/s
MV Peak grad: 88.7 mm[Hg]
MV VTI: 2.78 cm2
S' Lateral: 2.1 cm
Weight: 4160 [oz_av]

## 2023-06-27 LAB — BASIC METABOLIC PANEL
Anion gap: 10 (ref 5–15)
BUN: 25 mg/dL — ABNORMAL HIGH (ref 8–23)
CO2: 21 mmol/L — ABNORMAL LOW (ref 22–32)
Calcium: 8.9 mg/dL (ref 8.9–10.3)
Chloride: 107 mmol/L (ref 98–111)
Creatinine, Ser: 1.29 mg/dL — ABNORMAL HIGH (ref 0.44–1.00)
GFR, Estimated: 44 mL/min — ABNORMAL LOW (ref >60)
Glucose, Bld: 220 mg/dL — ABNORMAL HIGH (ref 70–99)
Potassium: 5 mmol/L (ref 3.5–5.1)
Sodium: 138 mmol/L (ref 135–145)

## 2023-06-27 LAB — MAGNESIUM: Magnesium: 2.5 mg/dL — ABNORMAL HIGH (ref 1.7–2.4)

## 2023-06-27 LAB — HEMOGLOBIN A1C
Hgb A1c MFr Bld: 8.5 % — ABNORMAL HIGH (ref 4.8–5.6)
Mean Plasma Glucose: 197 mg/dL

## 2023-06-27 LAB — CBG MONITORING, ED
Glucose-Capillary: 192 mg/dL — ABNORMAL HIGH (ref 70–99)
Glucose-Capillary: 192 mg/dL — ABNORMAL HIGH (ref 70–99)

## 2023-06-27 LAB — PHOSPHORUS: Phosphorus: 5 mg/dL — ABNORMAL HIGH (ref 2.5–4.6)

## 2023-06-27 LAB — CBC
HCT: 28.7 % — ABNORMAL LOW (ref 36.0–46.0)
Hemoglobin: 9.1 g/dL — ABNORMAL LOW (ref 12.0–15.0)
MCH: 25.3 pg — ABNORMAL LOW (ref 26.0–34.0)
MCHC: 31.7 g/dL (ref 30.0–36.0)
MCV: 79.7 fL — ABNORMAL LOW (ref 80.0–100.0)
Platelets: 272 10*3/uL (ref 150–400)
RBC: 3.6 MIL/uL — ABNORMAL LOW (ref 3.87–5.11)
RDW: 16.3 % — ABNORMAL HIGH (ref 11.5–15.5)
WBC: 5.5 10*3/uL (ref 4.0–10.5)
nRBC: 0 % (ref 0.0–0.2)

## 2023-06-27 LAB — GLUCOSE, CAPILLARY
Glucose-Capillary: 187 mg/dL — ABNORMAL HIGH (ref 70–99)
Glucose-Capillary: 188 mg/dL — ABNORMAL HIGH (ref 70–99)

## 2023-06-27 MED ORDER — AMLODIPINE BESYLATE 10 MG PO TABS
10.0000 mg | ORAL_TABLET | Freq: Every day | ORAL | Status: DC
  Administered 2023-06-28 – 2023-06-29 (×2): 10 mg via ORAL
  Filled 2023-06-27 (×2): qty 1

## 2023-06-27 MED ORDER — IPRATROPIUM-ALBUTEROL 0.5-2.5 (3) MG/3ML IN SOLN
3.0000 mL | Freq: Three times a day (TID) | RESPIRATORY_TRACT | Status: DC
  Administered 2023-06-27: 3 mL via RESPIRATORY_TRACT
  Filled 2023-06-27: qty 3

## 2023-06-27 MED ORDER — SODIUM CHLORIDE 0.9 % IV SOLN
2.0000 g | INTRAVENOUS | Status: DC
  Administered 2023-06-28: 2 g via INTRAVENOUS
  Filled 2023-06-27: qty 20

## 2023-06-27 MED ORDER — GABAPENTIN 100 MG PO CAPS
100.0000 mg | ORAL_CAPSULE | Freq: Two times a day (BID) | ORAL | Status: DC
  Administered 2023-06-27 – 2023-06-29 (×4): 100 mg via ORAL
  Filled 2023-06-27 (×4): qty 1

## 2023-06-27 MED ORDER — BUPROPION HCL ER (XL) 150 MG PO TB24
150.0000 mg | ORAL_TABLET | Freq: Two times a day (BID) | ORAL | Status: DC
Start: 2023-06-27 — End: 2023-06-29
  Administered 2023-06-27 – 2023-06-29 (×4): 150 mg via ORAL
  Filled 2023-06-27 (×4): qty 1

## 2023-06-27 MED ORDER — LABETALOL HCL 5 MG/ML IV SOLN
10.0000 mg | INTRAVENOUS | Status: DC | PRN
  Administered 2023-06-27: 10 mg via INTRAVENOUS
  Filled 2023-06-27: qty 4

## 2023-06-27 MED ORDER — IRBESARTAN 150 MG PO TABS
150.0000 mg | ORAL_TABLET | Freq: Every day | ORAL | Status: DC
  Administered 2023-06-27: 150 mg via ORAL
  Filled 2023-06-27: qty 1

## 2023-06-27 MED ORDER — INSULIN ASPART 100 UNIT/ML IJ SOLN
0.0000 [IU] | Freq: Three times a day (TID) | INTRAMUSCULAR | Status: DC
Start: 2023-06-27 — End: 2023-06-29
  Administered 2023-06-27: 3 [IU] via SUBCUTANEOUS
  Administered 2023-06-28: 2 [IU] via SUBCUTANEOUS
  Administered 2023-06-28: 5 [IU] via SUBCUTANEOUS
  Administered 2023-06-28: 2 [IU] via SUBCUTANEOUS
  Filled 2023-06-27: qty 0.15

## 2023-06-27 MED ORDER — PANTOPRAZOLE SODIUM 40 MG PO TBEC
40.0000 mg | DELAYED_RELEASE_TABLET | Freq: Every day | ORAL | Status: DC
  Administered 2023-06-27 – 2023-06-29 (×3): 40 mg via ORAL
  Filled 2023-06-27 (×3): qty 1

## 2023-06-27 MED ORDER — AZITHROMYCIN 250 MG PO TABS
500.0000 mg | ORAL_TABLET | Freq: Every day | ORAL | Status: DC
  Administered 2023-06-27 – 2023-06-29 (×3): 500 mg via ORAL
  Filled 2023-06-27 (×3): qty 2

## 2023-06-27 MED ORDER — DULOXETINE HCL 30 MG PO CPEP
30.0000 mg | ORAL_CAPSULE | Freq: Every day | ORAL | Status: DC
  Administered 2023-06-28: 30 mg via ORAL
  Filled 2023-06-27: qty 1

## 2023-06-27 MED ORDER — HYDRALAZINE HCL 50 MG PO TABS
50.0000 mg | ORAL_TABLET | Freq: Three times a day (TID) | ORAL | Status: DC
  Administered 2023-06-27 – 2023-06-29 (×6): 50 mg via ORAL
  Filled 2023-06-27 (×7): qty 1

## 2023-06-27 MED ORDER — PIPERACILLIN-TAZOBACTAM 3.375 G IVPB 30 MIN: 3.3750 g | Freq: Three times a day (TID) | INTRAVENOUS | Status: DC

## 2023-06-27 MED ORDER — LETROZOLE 2.5 MG PO TABS
2.5000 mg | ORAL_TABLET | Freq: Every day | ORAL | Status: DC
  Administered 2023-06-27 – 2023-06-29 (×3): 2.5 mg via ORAL
  Filled 2023-06-27 (×3): qty 1

## 2023-06-27 MED ORDER — INSULIN ASPART 100 UNIT/ML IJ SOLN
0.0000 [IU] | Freq: Every day | INTRAMUSCULAR | Status: DC
  Filled 2023-06-27: qty 0.05

## 2023-06-27 MED ORDER — PIPERACILLIN-TAZOBACTAM 3.375 G IVPB
3.3750 g | Freq: Three times a day (TID) | INTRAVENOUS | Status: DC
  Administered 2023-06-27: 3.375 g via INTRAVENOUS
  Filled 2023-06-27: qty 50

## 2023-06-27 NOTE — Progress Notes (Signed)
 Subjective: Pt having good bowel function and tolerating a diet.  Has not been dressing her wound at home  Objective: Vital signs in last 24 hours: Temp:  [98.1 F (36.7 C)-99.1 F (37.3 C)] 98.1 F (36.7 C) (12/31 0740) Pulse Rate:  [75-95] 75 (12/31 0740) Resp:  [18-31] 18 (12/31 0740) BP: (154-202)/(65-103) 193/67 (12/31 0740) SpO2:  [88 %-100 %] 98 % (12/31 0740) Weight:  [117.9 kg] 117.9 kg (12/30 1418)   Intake/Output from previous day: No intake/output data recorded. Intake/Output this shift: No intake/output data recorded.   General appearance: alert and cooperative GI: soft NT to palpation  Incision: no significant erythema or purulent drainage   Lab Results:  Recent Labs    06/26/23 2158 06/27/23 0623  WBC 6.6 5.5  HGB 8.6* 9.1*  HCT 28.8* 28.7*  PLT 280 272   BMET Recent Labs    06/26/23 1517 06/26/23 2158 06/27/23 0623  NA 139  --  138  K 4.4  --  5.0  CL 106  --  107  CO2 25  --  21*  GLUCOSE 103*  --  220*  BUN 19  --  25*  CREATININE 1.41* 1.57* 1.29*  CALCIUM  9.1  --  8.9   PT/INR No results for input(s): LABPROT, INR in the last 72 hours. ABG No results for input(s): PHART, HCO3 in the last 72 hours.  Invalid input(s): PCO2, PO2  MEDS, Scheduled  amLODipine   10 mg Oral Daily   buPROPion   150 mg Oral BID   carvedilol   25 mg Oral BID   DULoxetine   30 mg Oral Daily   enoxaparin  (LOVENOX ) injection  60 mg Subcutaneous Q24H   gabapentin   100 mg Oral BID   guaiFENesin   600 mg Oral BID   hydrALAZINE   50 mg Oral Q8H   irbesartan   150 mg Oral Daily   letrozole   2.5 mg Oral Daily   pantoprazole   40 mg Oral Daily    Studies/Results: CT CHEST ABDOMEN PELVIS W CONTRAST Result Date: 06/26/2023 CLINICAL DATA:  Concern for atypical pneumonia, potential postoperative abdominal infection, purulent drainage from the lower abdominal incision site, cough, shortness of breath, malaise, myalgia. Bowel surgery 06/08/2023 EXAM: CT  CHEST, ABDOMEN, AND PELVIS WITH CONTRAST TECHNIQUE: Multidetector CT imaging of the chest, abdomen and pelvis was performed following the standard protocol during bolus administration of intravenous contrast. RADIATION DOSE REDUCTION: This exam was performed according to the departmental dose-optimization program which includes automated exposure control, adjustment of the mA and/or kV according to patient size and/or use of iterative reconstruction technique. CONTRAST:  80mL OMNIPAQUE  IOHEXOL  300 MG/ML  SOLN COMPARISON:  Same day chest radiograph; CT abdomen and pelvis 03/08/2023; CTA chest 12/24/2013 FINDINGS: CT CHEST FINDINGS Cardiovascular: No pericardial effusion. Sternotomy and CABG. Coronary artery and aortic atherosclerotic calcification. Normal caliber thoracic aorta. Dilated main pulmonary artery measuring 39 mm. Mediastinum/Nodes: Trachea and esophagus are unremarkable. 1.2 cm pretracheal node on series 2/image 23 is favored reactive. Lungs/Pleura: Small bowel bilateral pleural effusions. Patchy ground-glass opacities throughout both lungs greatest in the right upper lobe posteriorly. Peribronchovascular atelectasis or consolidation in the lower lobes. No pneumothorax. Musculoskeletal: No acute fracture. CT ABDOMEN PELVIS FINDINGS Hepatobiliary: No acute abnormality. Pancreas: Unremarkable. Spleen: Unremarkable. Adrenals/Urinary Tract: Stable adrenal glands. Vascular calcifications in both renal hila. No urinary calculi or hydronephrosis. Unremarkable bladder. Stomach/Bowel: Interval postoperative change about the right colon with ileocolonic anastomosis in the right upper quadrant. Question poor enhancement of the neoterminal ileum (circa series 8/image 89). Mucosal  hyperenhancement about the colon at the hepatic flexure. Free fluid and stranding about the ileocolonic anastomosis. Free fluid extends inferiorly into the right pelvis. No organized fluid collection or abscess. No free air. Stomach is  within normal limits.  No bowel obstruction. Vascular/Lymphatic: Advanced atherosclerotic calcification of the aorta in its mesenteric branches. There is multifocal at least moderate and likely advanced narrowing in the SMA (circa series 2/image 66 and 2/73). No enlarged abdominal or pelvic lymph nodes. Reproductive: Hysterectomy.  No suspicious adnexal lesion. Other: No free intraperitoneal air. Musculoskeletal: No acute fracture. Edema and stranding about the rectus abdominis musculature. Stranding in the intraperitoneal fat deep to the rectus abdominis musculature as well as the subcutaneous fat. Fluid collection in the low midline anterior abdomen measures 1.5 x 5.8 cm. Mild peripheral hyperenhancement and stranding. IMPRESSION: 1. Interval postoperative change about the right colon with ileocolonic anastomosis in the right upper quadrant. 2. Question poor enhancement of the neoterminal ileum. Mucosal hyperenhancement within the proximal remnant colon. Free fluid and stranding about the distal ileum and ileocolonic anastomosis. Findings are nonspecific but can be seen with developing ischemia. Differential considerations include expected postoperative change or infectious/inflammatory enterocolitis. 3. Edema and stranding about the rectus abdominis musculature. Small fluid collection in the low midline anterior abdomen. Findings favor postoperative inflammation and edema with or without superimposed infection. 4. Patchy bilateral ground-glass opacities suggestive of atypical pneumonia. 5. Dilated main pulmonary artery measuring 39 mm, which can be seen in the setting of pulmonary hypertension. 6. Calcified plaque causes multifocal at least moderate and likely advanced narrowing in the SMA. Aortic Atherosclerosis (ICD10-I70.0). These results were called by telephone at the time of interpretation on 06/26/2023 at 8:17 pm to provider MATTHEW TRIFAN , who verbally acknowledged these results. Electronically Signed    By: Norman Gatlin M.D.   On: 06/26/2023 20:19   DG Chest 2 View Result Date: 06/26/2023 CLINICAL DATA:  Shortness of breath.  Evaluate for pneumonia. EXAM: CHEST - 2 VIEW COMPARISON:  Chest radiographs 06/22/2022 and 06/23/2021 FINDINGS: Status post median sternotomy and CABG. Cardiac silhouette is at the upper limits of normal size, unchanged. Mediastinal contours are within normal limits. Mild bilateral diffuse interstitial thickening is similar to prior. No focal airspace opacity to indicate pneumonia. No pleural effusion pneumothorax. Mild multilevel degenerative disc changes of the thoracic spine. IMPRESSION: 1. No focal airspace opacity to indicate pneumonia. 2. Mild bilateral diffuse interstitial thickening is similar to prior, likely chronic interstitial lung disease. Possible mild superimposed interstitial pulmonary edema. Electronically Signed   By: Tanda Lyons M.D.   On: 06/26/2023 16:01    Assessment: s/p  Patient Active Problem List   Diagnosis Date Noted   Pneumonia 06/26/2023   Colonic mass 06/08/2023   Chronic right shoulder pain 03/28/2023   Neck pain 03/28/2023   Primary osteoarthritis of right shoulder 08/10/2022   CAP (community acquired pneumonia) 06/22/2022   Stage 3b chronic kidney disease (CKD) (HCC) 06/22/2022   Trigger finger, left index finger 12/02/2021   DM type 2 with diabetic peripheral neuropathy (HCC) 07/26/2021   Bilateral low back pain with bilateral sciatica 07/26/2021   Gait abnormality 07/26/2021   Controlled substance agreement signed 12/31/2020   Shoulder stiffness, left 07/07/2020   Status post lumbar spine surgery for decompression of spinal cord 06/09/2020   Lumbar stenosis 05/27/2020   Spinal stenosis of lumbar region 05/21/2019   Complete tear of left rotator cuff 05/16/2019   Uncontrolled type 2 diabetes mellitus with hyperglycemia (HCC) 12/19/2018  Microalbuminuria 04/08/2018   Genetic testing 09/25/2017   Family history of breast  cancer    Family history of prostate cancer    Malignant neoplasm of upper-outer quadrant of right breast in female, estrogen receptor positive (HCC) 06/26/2017   Lumbar herniated disc 04/15/2015   Abnormal nuclear stress test 04/03/2015   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   CAD (coronary artery disease), native coronary artery 03/30/2015   S/P mitral valve repair 11/16/2010   Pure hypercholesterolemia 06/29/2010   Diabetes (HCC) 09/29/2006   ANEMIA-IRON  DEFICIENCY 09/29/2006   Essential hypertension 09/29/2006    Appears to be healing without any post op complications.  CT reviewed.  Combined with clinical exam, the RLQ stranding appears to be post surgical in nature.  There are no signs of ischemia.  Fluid collection in abd wall appears benign and wound shows no signs of infection.  Plan: Ok for diet Wound needs to be cleaned with soap and water and dressing applied  Will see PRN going forward   LOS: 1 day     .Bernarda JAYSON Ned, MD [facility], [place]    06/27/2023 8:31 AM

## 2023-06-27 NOTE — ED Notes (Signed)
 Nurse changed patient dressing on her IV

## 2023-06-27 NOTE — ED Notes (Signed)
Per pharmacy don't worry about administering gabapentin at this time

## 2023-06-27 NOTE — Plan of Care (Signed)

## 2023-06-27 NOTE — Progress Notes (Signed)
Lower extremity venous duplex completed. Please see CV Procedures for preliminary results.  Shona Simpson, RVT 06/27/23 1:15 PM

## 2023-06-27 NOTE — ED Notes (Signed)
Sent messages to pharmacy to verify Meds due

## 2023-06-27 NOTE — Progress Notes (Signed)
Echocardiogram 2D Echocardiogram has been performed.  [name] [name] 06/27/2023, 2:51 PM

## 2023-06-27 NOTE — Progress Notes (Signed)
 PROGRESS NOTE  ARDICE BOYAN FMW:[id number] DOB: Sep 08, 1950   PCP: Vicci Barnie NOVAK, MD  Patient is from: Home.  Lives with husband.  Uses cane at baseline.  DOA: 06/26/2023 LOS: 1  Chief complaints Chief Complaint  Patient presents with   Shortness of Breath     Brief Narrative / Interim history: 72 year old F with PMH of colon mass s/p robotic right colectomy on 12/12, CVA, DM-2, HTN, OSA not compliant with CPAP, breast cancer and papillary fibroelastoma s/p MVR and morbid obesity presenting with increased shortness of breath, fatigue and generalized weakness for about 2 days.  Initially presented to urgent care and referred to ED.  She was admitted with acute respiratory failure with hypoxia in the setting of pneumonia.    Patient was hypoxic to 88% on RA requiring 4 L.  Hypertensive to 180/71.  No leukocytosis.  CT c/a/p showed patchy bilateral GGO opacities, free fluid and stranding about the distal ileum, calcified plaque causing multifocal at least moderate and likely advanced narrowing in SMA, and ileocolonic anastomosis and dilated main PA measuring 39 mm.  General surgery consulted.  Started on IV Zosyn , and admitted.   The next day, evaluated by general surgery.  Antibiotic de-escalated to ceftriaxone .  Added Zithromax  for atypical coverage.  Subjective: Seen and examined earlier this morning.  No major events overnight of this morning.  Multiple complaints about care in ED. also concerned about her uncontrolled blood pressure given history of stroke.  Reports coughing and some shortness of breath.  No chest pain.  Patient's husband at bedside.  Daughter over the phone.  Objective: Vitals:   06/27/23 0230 06/27/23 0630 06/27/23 0740 06/27/23 1100  BP: (!) 158/65 (!) 185/72 (!) 193/67 (!) 197/78  Pulse: 77 76 75 78  Resp: 18  18 18   Temp: 98.2 F (36.8 C)  98.1 F (36.7 C)   TempSrc:   Oral   SpO2: 95% 100% 98% 96%  Weight:      Height:         Examination:  GENERAL: No apparent distress.  Nontoxic. HEENT: MMM.  Vision and hearing grossly intact.  NECK: Supple.  No apparent JVD.  RESP:  No IWOB.  Very rhonchorous. CVS:  RRR. Heart sounds normal.  ABD/GI/GU: BS+. Abd soft, NTND.  Small surgical wound with some moisture.  No apparent drainage.  No tenderness. MSK/EXT:  Moves extremities. No apparent deformity. No edema.  SKIN: no apparent skin lesion or wound NEURO: Awake, alert and oriented appropriately.  No apparent focal neuro deficit. PSYCH: Calm. Normal affect.   Procedures:  None  Microbiology summarized: COVID-19, influenza and RSV PCR nonreactive  Assessment and plan: Acute respiratory failure with hypoxia due to multifocal pneumonia: POA.  Desaturated to 88% but recovered to 90s on 4 L.  CT chest with patchy bilateral groundglass opacities.  Currently saturating in mid 90s on room air.  COVID-19, influenza and RSV PCR nonreactive.  Clinically low suspicion for aspiration. -De-escalate antibiotics to ceftriaxone , and add Zithromax  for atypical coverage. -Incentive spirometry, OOB/PT/OT   History of colonic mass -S/p robotic assisted partial right colectomy by general surgery Dr. Debby on 06/08/2023 previous hospitalization. -CT showed free fluid and stranding about the distal ileum and ileocolonic anastomosis.  -Evaluated by general surgery-recommended wound care -Wound care consulted  Uncontrolled hypertension: No chest pain or focal neurosymptoms. -Resumed home oral antihypertensives except HCTZ. -IV labetalol  as needed   Uncontrolled IDDM-2 with hyperglycemia: A1c 7.3% on 9/17. -Start CBG monitoring and SSI-moderate -Hold  home Farxiga   Chronic HFpEF: Appears euvolemic. -Hold home diuretics -Monitor fluid and respiratory status -Follow echocardiogram  History of papillary fibroelastoma: s/p mitral valve repair -Follow echocardiogram  Elevated troponin: Mild.  Likely demand ischemia in the  setting of uncontrolled hypertension and pneumonia -Continue home Lipitor , aspirin  and antihypertensive meds. -Follow echocardiogram   Diabetic polyneuropathy -Resume home gabapentin  and Cymbalta   History of OSA: Not compliant with CPAP -Encourage use  History of breast cancer -Resume home Femara    GERD -Continue PPI   Bilateral lower extremity edema: No asymmetry. -Follow lower extremity venous Doppler     Generalized weakness -PT/OT  Morbid obesity Body mass index is 39.53 kg/m. -Encourage lifestyle change to lose weight           DVT prophylaxis:  Subcu Lovenox   Code Status: Full code Family Communication: Updated patient's husband at bedside and daughter over the phone Level of care: Telemetry Status is: Inpatient Remains inpatient appropriate because: Community-acquired pneumonia, uncontrolled hypertension   Final disposition: Likely home once medically stable Consultants:  General surgery  55 minutes with more than 50% spent in reviewing records, counseling patient/family and coordinating care.   Sch Meds:  Scheduled Meds:  amLODipine   10 mg Oral Daily   azithromycin   500 mg Oral Daily   buPROPion   150 mg Oral BID   carvedilol   25 mg Oral BID   DULoxetine   30 mg Oral Daily   enoxaparin  (LOVENOX ) injection  60 mg Subcutaneous Q24H   gabapentin   100 mg Oral BID   guaiFENesin   600 mg Oral BID   hydrALAZINE   50 mg Oral Q8H   insulin  aspart  0-15 Units Subcutaneous TID WC   insulin  aspart  0-5 Units Subcutaneous QHS   irbesartan   150 mg Oral Daily   letrozole   2.5 mg Oral Daily   pantoprazole   40 mg Oral Daily   Continuous Infusions:  cefTRIAXone  (ROCEPHIN )  IV     PRN Meds:.acetaminophen , albuterol , labetalol , melatonin, oxyCODONE , polyethylene glycol, prochlorperazine   Antimicrobials: Anti-infectives (From admission, onward)    Start     Dose/Rate Route Frequency Ordered Stop   06/27/23 1215  cefTRIAXone  (ROCEPHIN ) 2 g in sodium chloride   0.9 % 100 mL IVPB        2 g 200 mL/hr over 30 Minutes Intravenous Every 24 hours 06/27/23 1202     06/27/23 1000  azithromycin  (ZITHROMAX ) tablet 500 mg        500 mg Oral Daily 06/27/23 0927     06/27/23 0400  piperacillin -tazobactam (ZOSYN ) IVPB 3.375 g  Status:  Discontinued        3.375 g 12.5 mL/hr over 240 Minutes Intravenous Every 8 hours 06/27/23 0105 06/27/23 1202   06/27/23 0115  piperacillin -tazobactam (ZOSYN ) IVPB 3.375 g  Status:  Discontinued        3.375 g 100 mL/hr over 30 Minutes Intravenous Every 8 hours 06/27/23 0102 06/27/23 0104   06/26/23 2030  piperacillin -tazobactam (ZOSYN ) IVPB 3.375 g        3.375 g 100 mL/hr over 30 Minutes Intravenous  Once 06/26/23 2029 06/26/23 2222        I have personally reviewed the following labs and images: CBC: Recent Labs  Lab 06/26/23 1517 06/26/23 2158 06/27/23 0623  WBC 7.4 6.6 5.5  NEUTROABS 5.8  --   --   HGB 9.7* 8.6* 9.1*  HCT 31.7* 28.8* 28.7*  MCV 79.6* 81.4 79.7*  PLT 300 280 272   BMP &GFR Recent Labs  Lab 06/26/23 1517  06/26/23 2158 06/27/23 0623  NA 139  --  138  K 4.4  --  5.0  CL 106  --  107  CO2 25  --  21*  GLUCOSE 103*  --  220*  BUN 19  --  25*  CREATININE 1.41* 1.57* 1.29*  CALCIUM  9.1  --  8.9  MG  --   --  2.5*  PHOS  --   --  5.0*   Estimated Creatinine Clearance: 51.6 mL/min (A) (by C-G formula based on SCr of 1.29 mg/dL (H)). Liver & Pancreas: No results for input(s): AST, ALT, ALKPHOS, BILITOT, PROT, ALBUMIN in the last 168 hours. No results for input(s): LIPASE, AMYLASE in the last 168 hours. No results for input(s): AMMONIA in the last 168 hours. Diabetic: No results for input(s): HGBA1C in the last 72 hours. Recent Labs  Lab 06/27/23 0009 06/27/23 1112  GLUCAP 192* 192*   Cardiac Enzymes: No results for input(s): CKTOTAL, CKMB, CKMBINDEX, TROPONINI in the last 168 hours. No results for input(s): PROBNP in the last 8760 hours. Coagulation  Profile: No results for input(s): INR, PROTIME in the last 168 hours. Thyroid  Function Tests: No results for input(s): TSH, T4TOTAL, FREET4, T3FREE, THYROIDAB in the last 72 hours. Lipid Profile: No results for input(s): CHOL, HDL, LDLCALC, TRIG, CHOLHDL, LDLDIRECT in the last 72 hours. Anemia Panel: No results for input(s): VITAMINB12, FOLATE, FERRITIN, TIBC, IRON , RETICCTPCT in the last 72 hours. Urine analysis:    Component Value Date/Time   COLORURINE YELLOW 04/15/2021 2000   APPEARANCEUR CLEAR 04/15/2021 2000   LABSPEC 1.010 04/15/2021 2000   PHURINE 6.0 04/15/2021 2000   GLUCOSEU NEGATIVE 04/15/2021 2000   GLUCOSEU NEGATIVE 10/14/2011 1440   HGBUR TRACE (A) 04/15/2021 2000   BILIRUBINUR NEGATIVE 04/15/2021 2000   KETONESUR NEGATIVE 04/15/2021 2000   PROTEINUR 30 (A) 04/15/2021 2000   UROBILINOGEN 0.2 10/14/2011 1440   NITRITE POSITIVE (A) 04/15/2021 2000   LEUKOCYTESUR NEGATIVE 04/15/2021 2000   Sepsis Labs: Invalid input(s): PROCALCITONIN, LACTICIDVEN  Microbiology: Recent Results (from the past 240 hours)  Resp panel by RT-PCR (RSV, Flu A&B, Covid) Anterior Nasal Swab     Status: None   Collection Time: 06/26/23  3:35 PM   Specimen: Anterior Nasal Swab  Result Value Ref Range Status   SARS Coronavirus 2 by RT PCR NEGATIVE NEGATIVE Final    Comment: (NOTE) SARS-CoV-2 target nucleic acids are NOT DETECTED.  The SARS-CoV-2 RNA is generally detectable in upper respiratory specimens during the acute phase of infection. The lowest concentration of SARS-CoV-2 viral copies this assay can detect is 138 copies/mL. A negative result does not preclude SARS-Cov-2 infection and should not be used as the sole basis for treatment or other patient management decisions. A negative result may occur with  improper specimen collection/handling, submission of specimen other than nasopharyngeal swab, presence of viral mutation(s) within  the areas targeted by this assay, and inadequate number of viral copies(<138 copies/mL). A negative result must be combined with clinical observations, patient history, and epidemiological information. The expected result is Negative.  Fact Sheet for Patients:  bloggercourse.com  Fact Sheet for Healthcare Providers:  seriousbroker.it  This test is no t yet approved or cleared by the [place]  FDA and  has been authorized for detection and/or diagnosis of SARS-CoV-2 by FDA under an Emergency Use Authorization (EUA). This EUA will remain  in effect (meaning this test can be used) for the duration of the COVID-19 declaration under Section 564(b)(1) of the Act,  21 U.S.C.section 360bbb-3(b)(1), unless the authorization is terminated  or revoked sooner.       Influenza A by PCR NEGATIVE NEGATIVE Final   Influenza B by PCR NEGATIVE NEGATIVE Final    Comment: (NOTE) The Xpert Xpress SARS-CoV-2/FLU/RSV plus assay is intended as an aid in the diagnosis of influenza from Nasopharyngeal swab specimens and should not be used as a sole basis for treatment. Nasal washings and aspirates are unacceptable for Xpert Xpress SARS-CoV-2/FLU/RSV testing.  Fact Sheet for Patients: bloggercourse.com  Fact Sheet for Healthcare Providers: seriousbroker.it  This test is not yet approved or cleared by the [place]  FDA and has been authorized for detection and/or diagnosis of SARS-CoV-2 by FDA under an Emergency Use Authorization (EUA). This EUA will remain in effect (meaning this test can be used) for the duration of the COVID-19 declaration under Section 564(b)(1) of the Act, 21 U.S.C. section 360bbb-3(b)(1), unless the authorization is terminated or revoked.     Resp Syncytial Virus by PCR NEGATIVE NEGATIVE Final    Comment: (NOTE) Fact Sheet for  Patients: bloggercourse.com  Fact Sheet for Healthcare Providers: seriousbroker.it  This test is not yet approved or cleared by the [place]  FDA and has been authorized for detection and/or diagnosis of SARS-CoV-2 by FDA under an Emergency Use Authorization (EUA). This EUA will remain in effect (meaning this test can be used) for the duration of the COVID-19 declaration under Section 564(b)(1) of the Act, 21 U.S.C. section 360bbb-3(b)(1), unless the authorization is terminated or revoked.  Performed at [facility], [address], [place], [place] [address]     Radiology Studies: CT CHEST ABDOMEN PELVIS W CONTRAST Result Date: 06/26/2023 CLINICAL DATA:  Concern for atypical pneumonia, potential postoperative abdominal infection, purulent drainage from the lower abdominal incision site, cough, shortness of breath, malaise, myalgia. Bowel surgery 06/08/2023 EXAM: CT CHEST, ABDOMEN, AND PELVIS WITH CONTRAST TECHNIQUE: Multidetector CT imaging of the chest, abdomen and pelvis was performed following the standard protocol during bolus administration of intravenous contrast. RADIATION DOSE REDUCTION: This exam was performed according to the departmental dose-optimization program which includes automated exposure control, adjustment of the mA and/or kV according to patient size and/or use of iterative reconstruction technique. CONTRAST:  80mL OMNIPAQUE  IOHEXOL  300 MG/ML  SOLN COMPARISON:  Same day chest radiograph; CT abdomen and pelvis 03/08/2023; CTA chest 12/24/2013 FINDINGS: CT CHEST FINDINGS Cardiovascular: No pericardial effusion. Sternotomy and CABG. Coronary artery and aortic atherosclerotic calcification. Normal caliber thoracic aorta. Dilated main pulmonary artery measuring 39 mm. Mediastinum/Nodes: Trachea and esophagus are unremarkable. 1.2 cm pretracheal node on series 2/image 23 is favored reactive. Lungs/Pleura:  Small bowel bilateral pleural effusions. Patchy ground-glass opacities throughout both lungs greatest in the right upper lobe posteriorly. Peribronchovascular atelectasis or consolidation in the lower lobes. No pneumothorax. Musculoskeletal: No acute fracture. CT ABDOMEN PELVIS FINDINGS Hepatobiliary: No acute abnormality. Pancreas: Unremarkable. Spleen: Unremarkable. Adrenals/Urinary Tract: Stable adrenal glands. Vascular calcifications in both renal hila. No urinary calculi or hydronephrosis. Unremarkable bladder. Stomach/Bowel: Interval postoperative change about the right colon with ileocolonic anastomosis in the right upper quadrant. Question poor enhancement of the neoterminal ileum (circa series 8/image 89). Mucosal hyperenhancement about the colon at the hepatic flexure. Free fluid and stranding about the ileocolonic anastomosis. Free fluid extends inferiorly into the right pelvis. No organized fluid collection or abscess. No free air. Stomach is within normal limits.  No bowel obstruction. Vascular/Lymphatic: Advanced atherosclerotic calcification of the aorta in its mesenteric branches. There is multifocal at least moderate  and likely advanced narrowing in the SMA (circa series 2/image 66 and 2/73). No enlarged abdominal or pelvic lymph nodes. Reproductive: Hysterectomy.  No suspicious adnexal lesion. Other: No free intraperitoneal air. Musculoskeletal: No acute fracture. Edema and stranding about the rectus abdominis musculature. Stranding in the intraperitoneal fat deep to the rectus abdominis musculature as well as the subcutaneous fat. Fluid collection in the low midline anterior abdomen measures 1.5 x 5.8 cm. Mild peripheral hyperenhancement and stranding. IMPRESSION: 1. Interval postoperative change about the right colon with ileocolonic anastomosis in the right upper quadrant. 2. Question poor enhancement of the neoterminal ileum. Mucosal hyperenhancement within the proximal remnant colon. Free  fluid and stranding about the distal ileum and ileocolonic anastomosis. Findings are nonspecific but can be seen with developing ischemia. Differential considerations include expected postoperative change or infectious/inflammatory enterocolitis. 3. Edema and stranding about the rectus abdominis musculature. Small fluid collection in the low midline anterior abdomen. Findings favor postoperative inflammation and edema with or without superimposed infection. 4. Patchy bilateral ground-glass opacities suggestive of atypical pneumonia. 5. Dilated main pulmonary artery measuring 39 mm, which can be seen in the setting of pulmonary hypertension. 6. Calcified plaque causes multifocal at least moderate and likely advanced narrowing in the SMA. Aortic Atherosclerosis (ICD10-I70.0). These results were called by telephone at the time of interpretation on 06/26/2023 at 8:17 pm to provider MATTHEW TRIFAN , who verbally acknowledged these results. Electronically Signed   By: Norman Gatlin M.D.   On: 06/26/2023 20:19   DG Chest 2 View Result Date: 06/26/2023 CLINICAL DATA:  Shortness of breath.  Evaluate for pneumonia. EXAM: CHEST - 2 VIEW COMPARISON:  Chest radiographs 06/22/2022 and 06/23/2021 FINDINGS: Status post median sternotomy and CABG. Cardiac silhouette is at the upper limits of normal size, unchanged. Mediastinal contours are within normal limits. Mild bilateral diffuse interstitial thickening is similar to prior. No focal airspace opacity to indicate pneumonia. No pleural effusion pneumothorax. Mild multilevel degenerative disc changes of the thoracic spine. IMPRESSION: 1. No focal airspace opacity to indicate pneumonia. 2. Mild bilateral diffuse interstitial thickening is similar to prior, likely chronic interstitial lung disease. Possible mild superimposed interstitial pulmonary edema. Electronically Signed   By: Tanda Lyons M.D.   On: 06/26/2023 16:01      Donnalee Cellucci T. Rhylen Shaheen [facility]  If  7PM-7AM, please contact night-coverage [email or web address] 06/27/2023, 12:12 PM

## 2023-06-27 NOTE — ED Notes (Signed)
Pharmacy stated they sent the lovenox to ER tube station but Im unable to locate it.

## 2023-06-27 NOTE — ED Notes (Signed)
 Messaged pharmacy unable to locate lovenox tubed to ER.

## 2023-06-27 NOTE — Patient Outreach (Signed)
  Care Management  Transitions of Care Program Transitions of Care Post-discharge week 3  06/27/2023 Name: [name] MRN: [id number] DOB: 1951-06-02  Subjective: [name] is a 72 y.o. year old female who is a primary care patient of Vicci Barnie NOVAK, MD. The Care Management team spoke with patient by telephone to assess and address transitions of care needs.   Plan: Additional outreach attempts will be made to reach the patient enrolled in the [facility] Program (Post Inpatient/ED Visit). Will call patient back later - patient is currently awaiting an inpatient bed at [facility], still in ED for c/o shortness of breath   Channing Larry, RN, BA, [facility], CRRN [facility] Care Management Coordinator, Transition of Care Ph # [phone]

## 2023-06-27 NOTE — ED Notes (Addendum)
 Still unable to locate heparin  that was tubed to ER. Per Poindexter RPH Can you ask Ariel if she has the Lovenox  for this patient? She asked for it around 00:30 and I tubed it to the ED then (it was originally tubed when I first arrived at 21:30). If she doesn't have it, let me know and I'll send it a third time.

## 2023-06-27 NOTE — Evaluation (Signed)
 Physical Therapy Evaluation Patient Details Name: [name] MRN: [id number] DOB: 12/20/1950 Today's Date: 06/27/2023  History of Present Illness  (P) 72 year old F with PMH of colon mass s/p robotic right colectomy on 12/12, CVA, DM-2, HTN, OSA not compliant with CPAP, breast cancer and papillary fibroelastoma s/p MVR and morbid obesity presenting with increased shortness of breath, fatigue and generalized weakness for about 2 days.  Initially presented to urgent care and referred to ED.  She was admitted with acute respiratory failure with hypoxia in the setting of pneumonia.  Clinical Impression   The patient reports up ad lib  in room, using cane(PT witnessed. ) \Patient ambulated  x 180' with SPC, gait slow, reports mild dizziness. BP 155/62 after ambulation, SPo2 95% , HR 80. Patient can  ambulate with floor staff and mobility Specialist. PT will sign off.       If plan is discharge home, recommend the following: A little help with bathing/dressing/bathroom;Assistance with cooking/housework;Help with stairs or ramp for entrance;Assist for transportation   Can travel by private vehicle        Equipment Recommendations None recommended by PT  Recommendations for Other Services       Functional Status Assessment Patient has not had a recent decline in their functional status     Precautions / Restrictions Precautions Precautions: Fall Precaution Comments: monitor sats Restrictions Weight Bearing Restrictions Per Provider Order: No      Mobility  Bed Mobility Overal bed mobility: Independent                  Transfers Overall transfer level: Modified independent Equipment used: Straight cane               General transfer comment: patient up ad lib to BR with cane    Ambulation/Gait Ambulation/Gait assistance: Contact guard assist Gait Distance (Feet): 180 Feet Assistive device: Straight cane Gait Pattern/deviations: Step-through pattern, Wide  base of support Gait velocity: decreased but functional     General Gait Details: stand rest break at 90'  Stairs            Wheelchair Mobility     Tilt Bed    Modified Rankin (Stroke Patients Only)       Balance Overall balance assessment: Mild deficits observed, not formally tested                                           Pertinent Vitals/Pain Pain Assessment Pain Score: 0-No pain    Home Living       Type of Home: Apartment Home Access: Stairs to enter   Entrance Stairs-Number of Steps: 2 (curb then 1 step) Alternate Level Stairs-Number of Steps: flight Home Layout: Two level;1/2 bath on main level;Bed/bath upstairs Home Equipment: BSC/3in1;Cane - single point;Shower Counsellor (2 wheels)      Prior Function Prior Level of Function : Independent/Modified Independent             Mobility Comments: Reports independent with ambulation but did get tired in community; does use cane ADLs Comments: independent adls and iadls     Extremity/Trunk Assessment   Upper Extremity Assessment Upper Extremity Assessment: Overall WFL for tasks assessed    Lower Extremity Assessment Lower Extremity Assessment: Generalized weakness    Cervical / Trunk Assessment Cervical / Trunk Assessment: Normal  Communication   Communication Communication: No apparent difficulties  Cognition Arousal: Alert Behavior During Therapy: WFL for tasks assessed/performed Overall Cognitive Status: Within Functional Limits for tasks assessed                                          General Comments      Exercises     Assessment/Plan    PT Assessment Patient does not need any further PT services (Mobility and nursing can ambulate)  PT Problem List Decreased activity tolerance       PT Treatment Interventions      PT Goals (Current goals can be found in the Care Plan section)  Acute Rehab PT Goals Patient Stated Goal:  return home PT Goal Formulation: All assessment and education complete, DC therapy    Frequency       Co-evaluation               AM-PAC PT 6 Clicks Mobility  Outcome Measure Help needed turning from your back to your side while in a flat bed without using bedrails?: None Help needed moving from lying on your back to sitting on the side of a flat bed without using bedrails?: None Help needed moving to and from a bed to a chair (including a wheelchair)?: None Help needed standing up from a chair using your arms (e.g., wheelchair or bedside chair)?: None Help needed to walk in hospital room?: None Help needed climbing 3-5 steps with a railing? : A Little 6 Click Score: 23    End of Session   Activity Tolerance: Patient tolerated treatment well Patient left: in bed;with call bell/phone within reach Nurse Communication: Mobility status PT Visit Diagnosis: Unsteadiness on feet (R26.81);Difficulty in walking, not elsewhere classified (R26.2)    Time: 8347-8284 PT Time Calculation (min) (ACUTE ONLY): 23 min   Charges:   PT Evaluation $PT Eval Low Complexity: 1 Low PT Treatments $Gait Training: 8-22 mins PT General Charges $$ ACUTE PT VISIT: 1 Visit         Darice Potters PT Acute Rehabilitation Services Office [phone] Weekend [phone]   Potters Darice Norris 06/27/2023, 5:21 PM

## 2023-06-27 NOTE — ED Notes (Signed)
Pt does not like where her IV is due to her being L handed and she uses that arm a lot.  As long as no fluids are running she is ok.  She states she wants IV moved if she needs fluids IV again.

## 2023-06-28 DIAGNOSIS — J189 Pneumonia, unspecified organism: Secondary | ICD-10-CM | POA: Diagnosis not present

## 2023-06-28 DIAGNOSIS — J9601 Acute respiratory failure with hypoxia: Secondary | ICD-10-CM | POA: Diagnosis not present

## 2023-06-28 LAB — RENAL FUNCTION PANEL
Albumin: 2.8 g/dL — ABNORMAL LOW (ref 3.5–5.0)
Anion gap: 8 (ref 5–15)
BUN: 33 mg/dL — ABNORMAL HIGH (ref 8–23)
CO2: 18 mmol/L — ABNORMAL LOW (ref 22–32)
Calcium: 8.1 mg/dL — ABNORMAL LOW (ref 8.9–10.3)
Chloride: 111 mmol/L (ref 98–111)
Creatinine, Ser: 1.63 mg/dL — ABNORMAL HIGH (ref 0.44–1.00)
GFR, Estimated: 33 mL/min — ABNORMAL LOW (ref >60)
Glucose, Bld: 174 mg/dL — ABNORMAL HIGH (ref 70–99)
Phosphorus: 3.4 mg/dL (ref 2.5–4.6)
Potassium: 4.4 mmol/L (ref 3.5–5.1)
Sodium: 137 mmol/L (ref 135–145)

## 2023-06-28 LAB — CBC
HCT: 27.6 % — ABNORMAL LOW (ref 36.0–46.0)
Hemoglobin: 8.6 g/dL — ABNORMAL LOW (ref 12.0–15.0)
MCH: 24.7 pg — ABNORMAL LOW (ref 26.0–34.0)
MCHC: 31.2 g/dL (ref 30.0–36.0)
MCV: 79.3 fL — ABNORMAL LOW (ref 80.0–100.0)
Platelets: 281 10*3/uL (ref 150–400)
RBC: 3.48 MIL/uL — ABNORMAL LOW (ref 3.87–5.11)
RDW: 16.4 % — ABNORMAL HIGH (ref 11.5–15.5)
WBC: 5.2 10*3/uL (ref 4.0–10.5)
nRBC: 0 % (ref 0.0–0.2)

## 2023-06-28 LAB — GLUCOSE, CAPILLARY
Glucose-Capillary: 134 mg/dL — ABNORMAL HIGH (ref 70–99)
Glucose-Capillary: 205 mg/dL — ABNORMAL HIGH (ref 70–99)
Glucose-Capillary: 141 mg/dL — ABNORMAL HIGH (ref 70–99)
Glucose-Capillary: 124 mg/dL — ABNORMAL HIGH (ref 70–99)

## 2023-06-28 LAB — BRAIN NATRIURETIC PEPTIDE: B Natriuretic Peptide: 225.9 pg/mL — ABNORMAL HIGH (ref 0.0–100.0)

## 2023-06-28 LAB — MAGNESIUM: Magnesium: 2.6 mg/dL — ABNORMAL HIGH (ref 1.7–2.4)

## 2023-06-28 MED ORDER — GUAIFENESIN-DM 100-10 MG/5ML PO SYRP: 5.0000 mL | ORAL_SOLUTION | ORAL | Status: DC | PRN

## 2023-06-28 MED ORDER — GUAIFENESIN ER 600 MG PO TB12
1200.0000 mg | ORAL_TABLET | Freq: Two times a day (BID) | ORAL | Status: AC
  Administered 2023-06-28 – 2023-06-29 (×2): 1200 mg via ORAL
  Filled 2023-06-28 (×2): qty 2

## 2023-06-28 MED ORDER — TRAZODONE HCL 50 MG PO TABS
25.0000 mg | ORAL_TABLET | Freq: Once | ORAL | Status: AC
Start: 2023-06-28 — End: 2023-06-28
  Administered 2023-06-28: 25 mg via ORAL
  Filled 2023-06-28: qty 1

## 2023-06-28 MED ORDER — INSULIN GLARGINE-YFGN 100 UNIT/ML ~~LOC~~ SOLN
10.0000 [IU] | Freq: Every day | SUBCUTANEOUS | Status: DC
  Administered 2023-06-28 – 2023-06-29 (×2): 10 [IU] via SUBCUTANEOUS
  Filled 2023-06-28 (×2): qty 0.1

## 2023-06-28 MED ORDER — INSULIN ASPART 100 UNIT/ML IJ SOLN
3.0000 [IU] | Freq: Three times a day (TID) | INTRAMUSCULAR | Status: DC
  Administered 2023-06-28 – 2023-06-29 (×2): 3 [IU] via SUBCUTANEOUS

## 2023-06-28 MED ORDER — IPRATROPIUM-ALBUTEROL 0.5-2.5 (3) MG/3ML IN SOLN
3.0000 mL | Freq: Three times a day (TID) | RESPIRATORY_TRACT | Status: DC
  Administered 2023-06-28 – 2023-06-29 (×4): 3 mL via RESPIRATORY_TRACT
  Filled 2023-06-28 (×4): qty 3

## 2023-06-28 MED ORDER — ALBUTEROL SULFATE (2.5 MG/3ML) 0.083% IN NEBU: 2.5000 mg | INHALATION_SOLUTION | RESPIRATORY_TRACT | Status: DC | PRN

## 2023-06-28 MED ORDER — IRBESARTAN 150 MG PO TABS
150.0000 mg | ORAL_TABLET | Freq: Every day | ORAL | Status: DC
  Administered 2023-06-28 – 2023-06-29 (×2): 150 mg via ORAL
  Filled 2023-06-28 (×2): qty 1

## 2023-06-28 MED ORDER — TRAZODONE HCL 50 MG PO TABS
50.0000 mg | ORAL_TABLET | Freq: Every day | ORAL | Status: DC
  Administered 2023-06-28: 50 mg via ORAL
  Filled 2023-06-28: qty 1

## 2023-06-28 NOTE — Plan of Care (Signed)

## 2023-06-28 NOTE — Progress Notes (Addendum)
 PROGRESS NOTE  [name] FMW:[id number] DOB: [birth year]   PCP: Vicci Barnie NOVAK, MD  Patient is from: Home.  Lives with husband.  Uses cane at baseline.  DOA: 06/26/2023 LOS: 2  Chief complaints Chief Complaint  Patient presents with   Shortness of Breath     Brief Narrative / Interim history: 73 year old F with PMH of colon mass s/p robotic right colectomy on 12/12, CVA, DM-2, HTN, OSA not compliant with CPAP, breast cancer and papillary fibroelastoma s/p MVR and morbid obesity presenting with increased shortness of breath, fatigue and generalized weakness for about 2 days.  Initially presented to urgent care and referred to ED.  She was admitted with acute respiratory failure with hypoxia in the setting of pneumonia.    Patient was hypoxic to 88% on RA requiring 4 L.  Hypertensive to 180/71.  No leukocytosis.  CT c/a/p showed patchy bilateral GGO opacities, free fluid and stranding about the distal ileum, calcified plaque causing multifocal at least moderate and likely advanced narrowing in SMA, and ileocolonic anastomosis and dilated main PA measuring 39 mm.  General surgery consulted.  Started on IV Zosyn , and admitted.   The next day, evaluated by general surgery.  Antibiotic de-escalated to ceftriaxone .  Zithromax  added for atypical coverage.  TTE and LE venous Doppler without significant finding.  Slowly improving.  Subjective: Seen and examined earlier this morning.  No major events overnight of this morning other than cough this morning.  Breathing has improved.  Blood pressure improved.  Objective: Vitals:   06/28/23 0249 06/28/23 0318 06/28/23 0814 06/28/23 1229  BP: (!) 154/61   (!) 152/69  Pulse: 70   71  Resp: 18   20  Temp: 98.3 F (36.8 C)   98.9 F (37.2 C)  TempSrc: Oral   Oral  SpO2: 96%  95% 97%  Weight:  117.9 kg    Height:        Examination:  GENERAL: No apparent distress.  Nontoxic. HEENT: MMM.  Vision and hearing grossly intact.  NECK:  Supple.  No apparent JVD.  RESP:  No IWOB.  Very rhonchorous. CVS:  RRR. Heart sounds normal.  ABD/GI/GU: BS+. Abd soft, NTND.  Small surgical wound with some moisture but no drainage.  No tenderness. MSK/EXT:  Moves extremities. No apparent deformity. No edema.  SKIN: no apparent skin lesion or wound NEURO: Awake, alert and oriented appropriately.  No apparent focal neuro deficit. PSYCH: Calm. Normal affect.   Procedures:  None  Microbiology summarized: COVID-19, influenza and RSV PCR nonreactive  Assessment and plan: Acute respiratory failure with hypoxia due to multifocal pneumonia: POA.  Desaturated to 88% but recovered to 90s on 4 L.  CT chest with patchy bilateral groundglass opacities.COVID-19, influenza and RSV PCR nonreactive.  Clinically low suspicion for aspiration.  Currently saturating in upper 90s on room air.  Continues to endorse significant cough -Continue ceftriaxone  and Zithromax  -Added DuoNeb every 8 hours with as needed albuterol . -Increase Mucinex  to 1200 mg twice daily -Add Delsym as needed -Continue holding diuretics and Farxiga . -Encourage oral hydration -Incentive spirometry, OOB/PT/OT   History of colonic mass -S/p robotic assisted partial right colectomy by general surgery Dr. Debby on 06/08/2023 previous hospitalization. -CT showed free fluid and stranding about the distal ileum and ileocolonic anastomosis.  -Evaluated by general surgery-recommended wound care -Wound care consulted  Uncontrolled hypertension: No chest pain or focal neurosymptoms. -Resumed home oral antihypertensives except HCTZ. -IV labetalol  as needed   Uncontrolled IDDM-2 with hyperglycemia: A1c  8.5% (was 7.3% on 9/17). Recent Labs  Lab 06/27/23 1112 06/27/23 1802 06/27/23 2140 06/28/23 0735 06/28/23 1122  GLUCAP 192* 187* 188* 134* 205*  -Continue SSI-moderate -Add NovoLog  3 units 3 times daily with meals -Add Semglee  10 units daily -Further adjustment as  appropriate -Hold Farxiga   Chronic HFpEF: Appears euvolemic.  TTE with LVEF of 60 to 65% and indeterminate DD.  BNP 225. -Continue holding home Farxiga  and Lasix . -Monitor fluid and respiratory status  History of papillary fibroelastoma: s/p mitral valve repair.  No significant finding on TTE.  Elevated troponin: Mild.  Likely demand ischemia in the setting of uncontrolled hypertension and pneumonia.  TTE reassuring. -Continue home Lipitor , aspirin  and antihypertensive meds.   Diabetic polyneuropathy -Continue home gabapentin  and Cymbalta   History of OSA: Not compliant with CPAP -Encourage use  History of breast cancer -Resume home Femara    GERD -Continue PPI   Bilateral lower extremity edema?  Did not appreciate on exam.  Lower extremity venous Doppler negative.   Generalized weakness -PT/OT-signed off.  Morbid obesity Body mass index is 39.52 kg/m. -Encourage lifestyle change to lose weight           DVT prophylaxis:  Subcu Lovenox   Code Status: Full code Family Communication: None at bedside. Level of care: Telemetry Status is: Inpatient Remains inpatient appropriate because: Community-acquired pneumonia with significant respiratory distress.   Final disposition: Likely home once medically stable Consultants:  General surgery  55 minutes with more than 50% spent in reviewing records, counseling patient/family and coordinating care.   Sch Meds:  Scheduled Meds:  amLODipine   10 mg Oral Daily   azithromycin   500 mg Oral Daily   buPROPion   150 mg Oral BID   carvedilol   25 mg Oral BID   DULoxetine   30 mg Oral Q lunch   enoxaparin  (LOVENOX ) injection  60 mg Subcutaneous Q24H   gabapentin   100 mg Oral BID   guaiFENesin   1,200 mg Oral BID   hydrALAZINE   50 mg Oral Q8H   insulin  aspart  0-15 Units Subcutaneous TID WC   insulin  aspart  0-5 Units Subcutaneous QHS   ipratropium-albuterol   3 mL Nebulization TID   irbesartan   150 mg Oral Daily   letrozole    2.5 mg Oral Daily   pantoprazole   40 mg Oral Daily   traZODone   50 mg Oral QHS   Continuous Infusions:  cefTRIAXone  (ROCEPHIN )  IV     PRN Meds:.acetaminophen , albuterol , guaiFENesin -dextromethorphan, labetalol , melatonin, oxyCODONE , polyethylene glycol, prochlorperazine   Antimicrobials: Anti-infectives (From admission, onward)    Start     Dose/Rate Route Frequency Ordered Stop   06/27/23 1215  cefTRIAXone  (ROCEPHIN ) 2 g in sodium chloride  0.9 % 100 mL IVPB        2 g 200 mL/hr over 30 Minutes Intravenous Every 24 hours 06/27/23 1202     06/27/23 1000  azithromycin  (ZITHROMAX ) tablet 500 mg        500 mg Oral Daily 06/27/23 0927     06/27/23 0400  piperacillin -tazobactam (ZOSYN ) IVPB 3.375 g  Status:  Discontinued        3.375 g 12.5 mL/hr over 240 Minutes Intravenous Every 8 hours 06/27/23 0105 06/27/23 1202   06/27/23 0115  piperacillin -tazobactam (ZOSYN ) IVPB 3.375 g  Status:  Discontinued        3.375 g 100 mL/hr over 30 Minutes Intravenous Every 8 hours 06/27/23 0102 06/27/23 0104   06/26/23 2030  piperacillin -tazobactam (ZOSYN ) IVPB 3.375 g        3.375  g 100 mL/hr over 30 Minutes Intravenous  Once 06/26/23 2029 06/26/23 2222        I have personally reviewed the following labs and images: CBC: Recent Labs  Lab 06/26/23 1517 06/26/23 2158 06/27/23 0623 06/28/23 0840  WBC 7.4 6.6 5.5 5.2  NEUTROABS 5.8  --   --   --   HGB 9.7* 8.6* 9.1* 8.6*  HCT 31.7* 28.8* 28.7* 27.6*  MCV 79.6* 81.4 79.7* 79.3*  PLT 300 280 272 281   BMP &GFR Recent Labs  Lab 06/26/23 1517 06/26/23 2158 06/27/23 0623 06/28/23 0537  NA 139  --  138 137  K 4.4  --  5.0 4.4  CL 106  --  107 111  CO2 25  --  21* 18*  GLUCOSE 103*  --  220* 174*  BUN 19  --  25* 33*  CREATININE 1.41* 1.57* 1.29* 1.63*  CALCIUM  9.1  --  8.9 8.1*  MG  --   --  2.5* 2.6*  PHOS  --   --  5.0* 3.4   Estimated Creatinine Clearance: 40.9 mL/min (A) (by C-G formula based on SCr of 1.63 mg/dL (H)). Liver &  Pancreas: Recent Labs  Lab 06/28/23 0537  ALBUMIN 2.8*   No results for input(s): LIPASE, AMYLASE in the last 168 hours. No results for input(s): AMMONIA in the last 168 hours. Diabetic: Recent Labs    06/27/23 1423  HGBA1C 8.5*   Recent Labs  Lab 06/27/23 1112 06/27/23 1802 06/27/23 2140 06/28/23 0735 06/28/23 1122  GLUCAP 192* 187* 188* 134* 205*   Cardiac Enzymes: No results for input(s): CKTOTAL, CKMB, CKMBINDEX, TROPONINI in the last 168 hours. No results for input(s): PROBNP in the last 8760 hours. Coagulation Profile: No results for input(s): INR, PROTIME in the last 168 hours. Thyroid  Function Tests: No results for input(s): TSH, T4TOTAL, FREET4, T3FREE, THYROIDAB in the last 72 hours. Lipid Profile: No results for input(s): CHOL, HDL, LDLCALC, TRIG, CHOLHDL, LDLDIRECT in the last 72 hours. Anemia Panel: No results for input(s): VITAMINB12, FOLATE, FERRITIN, TIBC, IRON , RETICCTPCT in the last 72 hours. Urine analysis:    Component Value Date/Time   COLORURINE YELLOW 04/15/2021 2000   APPEARANCEUR CLEAR 04/15/2021 2000   LABSPEC 1.010 04/15/2021 2000   PHURINE 6.0 04/15/2021 2000   GLUCOSEU NEGATIVE 04/15/2021 2000   GLUCOSEU NEGATIVE 10/14/2011 1440   HGBUR TRACE (A) 04/15/2021 2000   BILIRUBINUR NEGATIVE 04/15/2021 2000   KETONESUR NEGATIVE 04/15/2021 2000   PROTEINUR 30 (A) 04/15/2021 2000   UROBILINOGEN 0.2 10/14/2011 1440   NITRITE POSITIVE (A) 04/15/2021 2000   LEUKOCYTESUR NEGATIVE 04/15/2021 2000   Sepsis Labs: Invalid input(s): PROCALCITONIN, LACTICIDVEN  Microbiology: Recent Results (from the past 240 hours)  Resp panel by RT-PCR (RSV, Flu A&B, Covid) Anterior Nasal Swab     Status: None   Collection Time: 06/26/23  3:35 PM   Specimen: Anterior Nasal Swab  Result Value Ref Range Status   SARS Coronavirus 2 by RT PCR NEGATIVE NEGATIVE Final    Comment: (NOTE) SARS-CoV-2 target  nucleic acids are NOT DETECTED.  The SARS-CoV-2 RNA is generally detectable in upper respiratory specimens during the acute phase of infection. The lowest concentration of SARS-CoV-2 viral copies this assay can detect is 138 copies/mL. A negative result does not preclude SARS-Cov-2 infection and should not be used as the sole basis for treatment or other patient management decisions. A negative result may occur with  improper specimen collection/handling, submission of specimen other than nasopharyngeal  swab, presence of viral mutation(s) within the areas targeted by this assay, and inadequate number of viral copies(<138 copies/mL). A negative result must be combined with clinical observations, patient history, and epidemiological information. The expected result is Negative.  Fact Sheet for Patients:  bloggercourse.com  Fact Sheet for Healthcare Providers:  seriousbroker.it  This test is no t yet approved or cleared by the [place]  FDA and  has been authorized for detection and/or diagnosis of SARS-CoV-2 by FDA under an Emergency Use Authorization (EUA). This EUA will remain  in effect (meaning this test can be used) for the duration of the COVID-19 declaration under Section 564(b)(1) of the Act, 21 U.S.C.section 360bbb-3(b)(1), unless the authorization is terminated  or revoked sooner.       Influenza A by PCR NEGATIVE NEGATIVE Final   Influenza B by PCR NEGATIVE NEGATIVE Final    Comment: (NOTE) The Xpert Xpress SARS-CoV-2/FLU/RSV plus assay is intended as an aid in the diagnosis of influenza from Nasopharyngeal swab specimens and should not be used as a sole basis for treatment. Nasal washings and aspirates are unacceptable for Xpert Xpress SARS-CoV-2/FLU/RSV testing.  Fact Sheet for Patients: bloggercourse.com  Fact Sheet for Healthcare  Providers: seriousbroker.it  This test is not yet approved or cleared by the [place]  FDA and has been authorized for detection and/or diagnosis of SARS-CoV-2 by FDA under an Emergency Use Authorization (EUA). This EUA will remain in effect (meaning this test can be used) for the duration of the COVID-19 declaration under Section 564(b)(1) of the Act, 21 U.S.C. section 360bbb-3(b)(1), unless the authorization is terminated or revoked.     Resp Syncytial Virus by PCR NEGATIVE NEGATIVE Final    Comment: (NOTE) Fact Sheet for Patients: bloggercourse.com  Fact Sheet for Healthcare Providers: seriousbroker.it  This test is not yet approved or cleared by the [place]  FDA and has been authorized for detection and/or diagnosis of SARS-CoV-2 by FDA under an Emergency Use Authorization (EUA). This EUA will remain in effect (meaning this test can be used) for the duration of the COVID-19 declaration under Section 564(b)(1) of the Act, 21 U.S.C. section 360bbb-3(b)(1), unless the authorization is terminated or revoked.  Performed at [facility], [address] [place], [place] [address]     Radiology Studies: ECHOCARDIOGRAM COMPLETE Result Date: 06/27/2023    ECHOCARDIOGRAM REPORT   Patient Name:   [name] Date of Exam: 06/27/2023 Medical Rec #:  [id number]        Height:       68.0 in Accession #:    [id number]       Weight:       260.0 lb Date of Birth:  14-Jun-1951       BSA:          2.285 m Patient Age:    58 years         BP:           193/67 mmHg Patient Gender: F                HR:           74 bpm. Exam Location:  Inpatient Procedure: 2D Echo, Cardiac Doppler and Color Doppler Indications:    Elevated Troponin  History:        Patient has prior history of Echocardiogram examinations, most                 recent 03/03/2020. CAD; Risk Factors:Hypertension, Diabetes and  Former Smoker.                  Mitral Valve: valve is present in the mitral position.  Sonographer:    Ozell Free Referring Phys: [id number] CAROLE N HALL IMPRESSIONS  1. Left ventricular ejection fraction, by estimation, is 60 to 65%. The left ventricle has normal function. The left ventricle has no regional wall motion abnormalities. There is mild concentric left ventricular hypertrophy. Left ventricular diastolic parameters are indeterminate.  2. Right ventricular systolic function is normal. The right ventricular size is normal. There is moderately elevated pulmonary artery systolic pressure.  3. The mitral valve is normal in structure. Trivial mitral valve regurgitation. No evidence of mitral stenosis. There is a present in the mitral position.  4. The aortic valve is normal in structure. There is mild calcification of the aortic valve. Aortic valve regurgitation is not visualized. No aortic stenosis is present.  5. The inferior vena cava is normal in size with greater than 50% respiratory variability, suggesting right atrial pressure of 3 mmHg. FINDINGS  Left Ventricle: Left ventricular ejection fraction, by estimation, is 60 to 65%. The left ventricle has normal function. The left ventricle has no regional wall motion abnormalities. The left ventricular internal cavity size was normal in size. There is  mild concentric left ventricular hypertrophy. Left ventricular diastolic parameters are indeterminate. Right Ventricle: The right ventricular size is normal. No increase in right ventricular wall thickness. Right ventricular systolic function is normal. There is moderately elevated pulmonary artery systolic pressure. The tricuspid regurgitant velocity is 3.11 m/s, and with an assumed right atrial pressure of 8 mmHg, the estimated right ventricular systolic pressure is 46.7 mmHg. Left Atrium: Left atrial size was normal in size. Right Atrium: Right atrial size was normal in size. Pericardium: There is no  evidence of pericardial effusion. Mitral Valve: The mitral valve is normal in structure. Trivial mitral valve regurgitation. There is a present in the mitral position. No evidence of mitral valve stenosis. MV peak gradient, 7.7 mmHg. The mean mitral valve gradient is 4.0 mmHg. Tricuspid Valve: The tricuspid valve is normal in structure. Tricuspid valve regurgitation is not demonstrated. No evidence of tricuspid stenosis. Aortic Valve: The aortic valve is normal in structure. There is mild calcification of the aortic valve. Aortic valve regurgitation is not visualized. No aortic stenosis is present. Aortic valve mean gradient measures 7.0 mmHg. Aortic valve peak gradient measures 13.2 mmHg. Aortic valve area, by VTI measures 2.70 cm. Pulmonic Valve: The pulmonic valve was normal in structure. Pulmonic valve regurgitation is trivial. No evidence of pulmonic stenosis. Aorta: The aortic root is normal in size and structure. Venous: The inferior vena cava is normal in size with greater than 50% respiratory variability, suggesting right atrial pressure of 3 mmHg. IAS/Shunts: No atrial level shunt detected by color flow Doppler.  LEFT VENTRICLE PLAX 2D LVIDd:         4.50 cm   Diastology LVIDs:         2.10 cm   LV e' medial:    5.44 cm/s LV PW:         1.20 cm   LV E/e' medial:  23.3 LV IVS:        1.10 cm   LV e' lateral:   7.07 cm/s LVOT diam:     2.00 cm   LV E/e' lateral: 18.0 LV SV:         96 LV SV Index:   42 LVOT Area:  3.14 cm  RIGHT VENTRICLE            IVC RV Basal diam:  3.50 cm    IVC diam: 2.10 cm RV S prime:     6.96 cm/s TAPSE (M-mode): 2.0 cm LEFT ATRIUM             Index        RIGHT ATRIUM           Index LA diam:        4.80 cm 2.10 cm/m   RA Area:     17.10 cm LA Vol (A2C):   45.1 ml 19.74 ml/m  RA Volume:   47.80 ml  20.92 ml/m LA Vol (A4C):   30.7 ml 13.44 ml/m LA Biplane Vol: 37.2 ml 16.28 ml/m  AORTIC VALVE AV Area (Vmax):    2.30 cm AV Area (Vmean):   2.40 cm AV Area (VTI):     2.70  cm AV Vmax:           182.00 cm/s AV Vmean:          121.000 cm/s AV VTI:            0.355 m AV Peak Grad:      13.2 mmHg AV Mean Grad:      7.0 mmHg LVOT Vmax:         133.00 cm/s LVOT Vmean:        92.300 cm/s LVOT VTI:          0.305 m LVOT/AV VTI ratio: 0.86  AORTA Ao Root diam: 3.10 cm Ao Asc diam:  2.50 cm MITRAL VALVE                TRICUSPID VALVE MV Area (PHT): 3.02 cm     TR Peak grad:   38.7 mmHg MV Area VTI:   2.78 cm     TR Vmax:        311.00 cm/s MV Peak grad:  7.7 mmHg MV Mean grad:  4.0 mmHg     SHUNTS MV Vmax:       1.39 m/s     Systemic VTI:  0.30 m MV Vmean:      91.5 cm/s    Systemic Diam: 2.00 cm MV Decel Time: 251 msec MR Peak grad: 88.7 mmHg MR Vmax:      471.00 cm/s MV E velocity: 127.00 cm/s MV A velocity: 99.00 cm/s MV E/A ratio:  1.28 Aditya Sabharwal Electronically signed by Ria Commander Signature Date/Time: 06/27/2023/4:30:58 PM    Final       Delaina Fetsch T. Eryc Bodey [facility]  If 7PM-7AM, please contact night-coverage [email or web address] 06/28/2023, 1:34 PM

## 2023-06-28 NOTE — Plan of Care (Signed)
  Problem: Health Behavior/Discharge Planning: Goal: Ability to manage health-related needs will improve Outcome: Progressing   Problem: Education: Goal: Knowledge of General Education information will improve Description: Including pain rating scale, medication(s)/side effects and non-pharmacologic comfort measures Outcome: Progressing   Problem: Health Behavior/Discharge Planning: Goal: Ability to manage health-related needs will improve Outcome: Progressing   Problem: Clinical Measurements: Goal: Ability to maintain clinical measurements within normal limits will improve Outcome: Progressing Goal: Will remain free from infection Outcome: Progressing Goal: Diagnostic test results will improve Outcome: Progressing Goal: Respiratory complications will improve Outcome: Progressing

## 2023-06-28 NOTE — Progress Notes (Signed)
   06/28/23 0000  BiPAP/CPAP/SIPAP  Reason BIPAP/CPAP not in use Non-compliant   Not currently using CPAP/BiPAP.

## 2023-06-28 NOTE — Evaluation (Signed)
 Occupational Therapy Evaluation Patient Details Name: HALIA FRANEY MRN: [id number] DOB: 11-Jan-1951 Today's Date: 06/28/2023   History of Present Illness Pt is 73 yr old female admitted on 06-26-23 with fatigue and weakness. She was found to have PNA and acute hypoxic respiratory failure with hypoxia. She underwent a recent R colectomy on 06/08/23, due to a colon mass. PMH: anemia, arthritis, HTN, R breast CA, CAD s/p CABG, DM, CVA, CKD III, polyneuropathy, mitral valve repair, chronic heart failure   Clinical Impression   The pt performed all assessed tasks with distant supervision or better, including simulated lower body dressing, sit to stand and ambulating in the hall using a cane. She reported getting up to the bathroom without the need for assistance during this hospital stay. OT provided all needed education/recommendations during the session today and the pt does not require further OT services. OT will sign off and recommend she return home with her family at discharge.       If plan is discharge home, recommend the following: Assist for transportation;Assistance with cooking/housework    Functional Status Assessment  Patient has not had a recent decline in their functional status  Equipment Recommendations  None recommended by OT    Recommendations for Other Services       Precautions / Restrictions Restrictions Weight Bearing Restrictions Per Provider Order: No      Mobility Bed Mobility        General bed mobility comments: Pt was received seated in bedside chair    Transfers Overall transfer level: Modified independent Equipment used:  (hurrycane) Transfers: Sit to/from Stand Sit to Stand: Supervision                  Balance     Sitting balance-Leahy Scale: Good         Standing balance comment: Fair+ with cane         ADL either performed or assessed with clinical judgement   ADL Overall ADL's : At baseline    General ADL Comments:  The pt performed all assessed self-care tasks with distant supervision or better.      Pertinent Vitals/Pain Pain Assessment Pain Assessment: Faces Pain Score: 2  Pain Location: abdomen Pain Intervention(s): Limited activity within patient's tolerance, Monitored during session     Extremity/Trunk Assessment Upper Extremity Assessment Upper Extremity Assessment: Overall WFL for tasks assessed;Left hand dominant   Lower Extremity Assessment Lower Extremity Assessment: Overall WFL for tasks assessed       Communication Communication Communication: No apparent difficulties   Cognition Arousal: Alert Behavior During Therapy: WFL for tasks assessed/performed Overall Cognitive Status: Within Functional Limits for tasks assessed                       Home Living Family/patient expects to be discharged to:: Private residence Living Arrangements: Children;Spouse/significant other (spouse, son, and grandson) Available Help at Discharge: Family Type of Home: Other(Comment) (Town house) Home Access: Stairs to enter Secretary/administrator of Steps: 1   Home Layout: Two level;1/2 bath on main level Alternate Level Stairs-Number of Steps: full bathroom and bedroom is on upper level of home   Bathroom Shower/Tub: Tub/shower unit   Bathroom Toilet: Handicapped height     Home Equipment: Tub bench;Rolling Environmental Consultant (2 wheels);Rollator (4 wheels);Cane - single point          Prior Functioning/Environment Prior Level of Function : Independent/Modified Independent  Mobility Comments: She has been using a hurrycane for ambulation. ADLs Comments: Prior to recent surgery, she was modified independent to independent with ADLs.             OT Treatment/Interventions:   N/A      OT Frequency:  N/A       AM-PAC OT 6 Clicks Daily Activity     Outcome Measure Help from another person eating meals?: None Help from another person taking care of personal  grooming?: None Help from another person toileting, which includes using toliet, bedpan, or urinal?: None Help from another person bathing (including washing, rinsing, drying)?: A Little Help from another person to put on and taking off regular upper body clothing?: None Help from another person to put on and taking off regular lower body clothing?: None 6 Click Score: 23   End of Session Equipment Utilized During Treatment: Other (comment) (cane) Nurse Communication: Other (comment) (pt inquired about her current diet and whether or not wound care will be by to see her)  Activity Tolerance: Patient tolerated treatment well Patient left: in chair;with call bell/phone within reach  OT Visit Diagnosis: Muscle weakness (generalized) (M62.81)                Time: 8671-8647 OT Time Calculation (min): 24 min Charges:  OT General Charges $OT Visit: 1 Visit OT Evaluation $OT Eval Low Complexity: 1 Low    Adelard Sanon L Bristyl Mclees, OTR/L 06/28/2023, 2:09 PM

## 2023-06-28 NOTE — Progress Notes (Signed)
   06/28/23 2021  BiPAP/CPAP/SIPAP  BiPAP/CPAP/SIPAP Pt Type Adult  Reason BIPAP/CPAP not in use Non-compliant

## 2023-06-28 NOTE — Plan of Care (Signed)
  Problem: Education: Goal: Knowledge of General Education information will improve Description: Including pain rating scale, medication(s)/side effects and non-pharmacologic comfort measures Outcome: Progressing   Problem: Clinical Measurements: Goal: Diagnostic test results will improve Outcome: Progressing Goal: Respiratory complications will improve Outcome: Progressing   Problem: Activity: Goal: Risk for activity intolerance will decrease Outcome: Progressing   Problem: Safety: Goal: Ability to remain free from injury will improve Outcome: Progressing   Problem: Skin Integrity: Goal: Risk for impaired skin integrity will decrease Outcome: Progressing

## 2023-06-29 DIAGNOSIS — J189 Pneumonia, unspecified organism: Secondary | ICD-10-CM | POA: Diagnosis not present

## 2023-06-29 LAB — RENAL FUNCTION PANEL
Albumin: 2.6 g/dL — ABNORMAL LOW (ref 3.5–5.0)
Anion gap: 5 (ref 5–15)
BUN: 26 mg/dL — ABNORMAL HIGH (ref 8–23)
CO2: 19 mmol/L — ABNORMAL LOW (ref 22–32)
Calcium: 8 mg/dL — ABNORMAL LOW (ref 8.9–10.3)
Chloride: 109 mmol/L (ref 98–111)
Creatinine, Ser: 1.53 mg/dL — ABNORMAL HIGH (ref 0.44–1.00)
GFR, Estimated: 35 mL/min — ABNORMAL LOW (ref >60)
Glucose, Bld: 80 mg/dL (ref 70–99)
Phosphorus: 3.7 mg/dL (ref 2.5–4.6)
Potassium: 4.3 mmol/L (ref 3.5–5.1)
Sodium: 133 mmol/L — ABNORMAL LOW (ref 135–145)

## 2023-06-29 LAB — CBC
HCT: 27.2 % — ABNORMAL LOW (ref 36.0–46.0)
Hemoglobin: 8.3 g/dL — ABNORMAL LOW (ref 12.0–15.0)
MCH: 24.6 pg — ABNORMAL LOW (ref 26.0–34.0)
MCHC: 30.5 g/dL (ref 30.0–36.0)
MCV: 80.5 fL (ref 80.0–100.0)
Platelets: 263 10*3/uL (ref 150–400)
RBC: 3.38 MIL/uL — ABNORMAL LOW (ref 3.87–5.11)
RDW: 16.4 % — ABNORMAL HIGH (ref 11.5–15.5)
WBC: 4.7 10*3/uL (ref 4.0–10.5)
nRBC: 0 % (ref 0.0–0.2)

## 2023-06-29 LAB — GLUCOSE, CAPILLARY: Glucose-Capillary: 79 mg/dL (ref 70–99)

## 2023-06-29 LAB — MAGNESIUM: Magnesium: 2.5 mg/dL — ABNORMAL HIGH (ref 1.7–2.4)

## 2023-06-29 MED ORDER — GUAIFENESIN ER 600 MG PO TB12: 600.0000 mg | ORAL_TABLET | Freq: Two times a day (BID) | ORAL | 0 refills | Status: AC

## 2023-06-29 MED ORDER — HYDRALAZINE HCL 50 MG PO TABS: 100.0000 mg | ORAL_TABLET | Freq: Two times a day (BID) | ORAL | Status: DC

## 2023-06-29 MED ORDER — SODIUM CHLORIDE 0.9 % IV SOLN: 2.0000 g | INTRAVENOUS | Status: DC

## 2023-06-29 MED ORDER — AMOXICILLIN-POT CLAVULANATE 875-125 MG PO TABS: 1.0000 | ORAL_TABLET | Freq: Two times a day (BID) | ORAL | 0 refills | Status: AC

## 2023-06-29 MED ORDER — VALSARTAN 160 MG PO TABS
160.0000 mg | ORAL_TABLET | Freq: Every day | ORAL | 1 refills | Status: DC
Start: 2023-06-29 — End: 2023-09-28

## 2023-06-29 MED ORDER — AZITHROMYCIN 500 MG PO TABS: 500.0000 mg | ORAL_TABLET | Freq: Every day | ORAL | 0 refills | Status: AC

## 2023-06-29 NOTE — Consult Note (Signed)
 WOC Nurse Consult Note: Reason for Consult:Nonhealing surgical wound to abdomen. Located in abdominal pannus.  Patient has not been performing any wound care to this area at home. Is oozing effluent with musty odor.  Wound type: surgical wound, located in abdominal pannus.  Pressure Injury POA: NA Measurement: 0.6 cm open area in abdominal pannus Wound bed: pink and moist Drainage (amount, consistency, odor)  creamy effluent with musty odor.  Skin fold is moist Periwound: intact Dressing procedure/placement/frequency: Initiate Interdry to abdominal pannus.  Order Gerlean # [phone] Measure and cut length of InterDry to fit in skin folds that have skin breakdown Tuck InterDry fabric into skin folds in a single layer, allow for 2 inches of overhang from skin edges to allow for wicking to occur May remove to bathe; dry area thoroughly and then tuck into affected areas again Do not apply any creams or ointments when using InterDry DO NOT THROW AWAY FOR 5 DAYS unless soiled with stool DO NOT [facility] product, this will inactivate the silver in the material  New sheet of Interdry should be applied after 5 days of use if patient continues to have skin breakdown    Will not follow at this time.  Please re-consult if needed.  Darice Cooley MSN, RN, FNP-BC CWON Wound, Ostomy, Continence Nurse Outpatient [facility] [phone] Pager [phone]

## 2023-06-29 NOTE — Consult Note (Signed)
 [facility] Liaison Consult Note    06/29/2023  DEVA RON Sep 05, 1950 [id number]  [facility] Patient:  Active in 30 day [facility] program  Primary Care Provider:  Vicci Barnie NOVAK, MD with [facility], this provider is listed to provide the community transition of care follow up and TOC calls  This is a remote coverage review for patient admitted to [facility] . Call attempts unsuccessful to reach patient at hospital bedside phone due to request for home meal delivery information.  Insurance: [employer] Medicare Choice PPO  Patient is currently active with [facility] for care coordination services.  Patient has been engaged by a Nurse, [facility] team.  Patient will receive a post hospital call and will be evaluated for assessments and disease process education.   Currently for home with [facility] and wound care.   Plan: Continue to follow for any additional community care coordination needs for post hospital/community needs. Will update [facility] RN TOC of needs.   Of note, [facility] services does not replace or interfere with any services that are needed or arranged by inpatient [facility] care management team.   Richerd Fish, RN, BSN, CCM [place]  [facility], [facility] Liaison Direct Dial: [phone] or secure chat Email: [email or web address]

## 2023-06-29 NOTE — TOC Initial Note (Signed)
 Transition of Care [facility]) - Initial/Assessment Note    Patient Details  Name: [name] MRN: [id number] Date of Birth: 10/09/1950  Transition of [facility] CM/SW Contact:    Sonda Manuella Quill, RN Phone Number: 06/29/2023, 11:34 AM  Clinical Narrative:                 Spoke w/ pt in room; pt says she lives at home w/ her spouse and family; she plans to return at d/c; pt verified insurance/PCP; her sister will transport her home; pt identified POC dtr Adams Fireman and sister Flaxine Smith; she denies SDOH risks; pt says she has cane, rolator, walker, and shower chair; pt says she has HHPT w/ [facility]; she would like to con't this service; pt says she does not have home oxygen; pt requests resource for mobile meal delivery; resource for social services placed in d/c instructions; she will make her own appt for this service; contact info for [facility] placed in follow up provider section of d/c instructions; no TOC needs.  Expected Discharge Plan: Home w Home Health Services Barriers to Discharge: No Barriers Identified   Patient Goals and CMS Choice Patient states their goals for this hospitalization and ongoing recovery are:: home CMS Medicare.gov Compare Post Acute Care list provided to:: Patient Choice offered to / list presented to : Patient [place] ownership interest in [facility].provided to:: Patient    Expected Discharge Plan and Services   Discharge Planning Services: CM Consult Post Acute Care Choice: Resumption of Svcs/PTA Provider Living arrangements for the past 2 months: Single Family Home Expected Discharge Date: 06/29/23               DME Arranged: N/A DME Agency: NA       HH Arranged: PT HH Agency: [facility] Date HH Agency Contacted: 06/29/23 Time HH Agency Contacted: 1132 Representative spoke with at [facility] Agency: [place]  Prior Living Arrangements/Services Living arrangements for the past 2 months: Single Family Home Lives  with:: Spouse, Relatives Patient language and need for interpreter reviewed:: Yes Do you feel safe going back to the place where you live?: Yes      Need for Family Participation in Patient Care: Yes (Comment) Care giver support system in place?: Yes (comment) Current home services: DME (cane, walker, rolator, shower chair) Criminal Activity/Legal Involvement Pertinent to Current Situation/Hospitalization: Yes - Comment as needed  Activities of Daily Living   ADL Screening (condition at time of admission) Independently performs ADLs?: Yes (appropriate for developmental age) Is the patient deaf or have difficulty hearing?: No Does the patient have difficulty seeing, even when wearing glasses/contacts?: No Does the patient have difficulty concentrating, remembering, or making decisions?: No  Permission Sought/Granted Permission sought to share information with : Case Manager Permission granted to share information with : Yes, Verbal Permission Granted  Share Information with NAME: Case Manager     Permission granted to share info w Relationship: Adams Fireman (dtr) [phone] / Flaxine Smith (sister) [phone]     Emotional Assessment Appearance:: Appears stated age Attitude/Demeanor/Rapport: Gracious Affect (typically observed): Accepting Orientation: : Oriented to Self, Oriented to Place, Oriented to  Time, Oriented to Situation Alcohol  / Substance Use: Not Applicable Psych Involvement: No (comment)  Admission diagnosis:  Pneumonia [J18.9] Patient Active Problem List   Diagnosis Date Noted   Pneumonia 06/26/2023   Colonic mass 06/08/2023   Chronic right shoulder pain 03/28/2023   Neck pain 03/28/2023   Primary osteoarthritis of right shoulder 08/10/2022  CAP (community acquired pneumonia) 06/22/2022   Stage 3b chronic kidney disease (CKD) (HCC) 06/22/2022   Trigger finger, left index finger 12/02/2021   DM type 2 with diabetic peripheral neuropathy (HCC) 07/26/2021    Bilateral low back pain with bilateral sciatica 07/26/2021   Gait abnormality 07/26/2021   Controlled substance agreement signed 12/31/2020   Shoulder stiffness, left 07/07/2020   Status post lumbar spine surgery for decompression of spinal cord 06/09/2020   Lumbar stenosis 05/27/2020   Spinal stenosis of lumbar region 05/21/2019   Complete tear of left rotator cuff 05/16/2019   Uncontrolled type 2 diabetes mellitus with hyperglycemia (HCC) 12/19/2018   Microalbuminuria 04/08/2018   Genetic testing 09/25/2017   Family history of breast cancer    Family history of prostate cancer    Malignant neoplasm of upper-outer quadrant of right breast in female, estrogen receptor positive (HCC) 06/26/2017   Lumbar herniated disc 04/15/2015   Abnormal nuclear stress test 04/03/2015   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   CAD (coronary artery disease), native coronary artery 03/30/2015   S/P mitral valve repair 11/16/2010   Pure hypercholesterolemia 06/29/2010   Diabetes (HCC) 09/29/2006   ANEMIA-IRON  DEFICIENCY 09/29/2006   Essential hypertension 09/29/2006   PCP:  Vicci Barnie NOVAK, MD Pharmacy:   [facility] [address] [place], [place] - [address] [place] [address] Phone: [phone] Fax: [phone]     Social Drivers of Health (SDOH) Social History: SDOH Screenings   Food Insecurity: No Food Insecurity (06/29/2023)  Housing: Low Risk  (06/29/2023)  Transportation Needs: No Transportation Needs (06/29/2023)  Utilities: Not At Risk (06/27/2023)  Alcohol  Screen: Low Risk  (01/24/2023)  Depression (PHQ2-9): Low Risk  (03/14/2023)  Financial Resource Strain: Low Risk  (01/24/2023)  Physical Activity: Insufficiently Active (01/24/2023)  Social Connections: Moderately Integrated (06/27/2023)  Stress: No Stress Concern Present (01/24/2023)  Tobacco Use: Medium Risk (06/27/2023)  Health Literacy: Adequate Health Literacy (06/13/2023)   SDOH  Interventions: Food Insecurity Interventions: Intervention Not Indicated, Inpatient TOC Housing Interventions: Intervention Not Indicated, Inpatient TOC Transportation Interventions: Intervention Not Indicated, Inpatient TOC   Readmission Risk Interventions     No data to display

## 2023-06-29 NOTE — Discharge Summary (Signed)
 Physician Discharge Summary  [name] FMW:[id number] DOB: 1951-04-16 DOA: 06/26/2023  PCP: Vicci Barnie NOVAK, MD  Admit date: 06/26/2023 Discharge date: 06/29/2023 Admitted From: Home Disposition: Home Recommendations for Outpatient Follow-up:  Follow up with PCP in 1 to 2 weeks Outpatient follow-up with general surgery as previously planned Check glycemic control, blood pressure, CMP and CBC at follow-up Please follow up on the following pending results: None  Home Health: HH PT Equipment/Devices: None  Discharge Condition: Stable CODE STATUS: Full code  Follow-up Information     Vicci Barnie NOVAK, MD. Schedule an appointment as soon as possible for a visit in 1 week(s).   Specialty: Internal Medicine Contact information: [address] [place] [address] [phone]         [facility] Follow up.   Specialty: Home Health Services Contact information: [address] [place] [address] [phone]                 Hospital course 73 year old F with PMH of colon mass s/p robotic right colectomy on 12/12, CVA, DM-2, HTN, OSA not compliant with CPAP, breast cancer and papillary fibroelastoma s/p MVR and morbid obesity presenting with increased shortness of breath, fatigue and generalized weakness for about 2 days.  Initially presented to urgent care and referred to ED.  She was admitted with acute respiratory failure with hypoxia in the setting of pneumonia.     Patient was hypoxic to 88% on RA requiring 4 L.  Hypertensive to 180/71.  No leukocytosis.  CT c/a/p showed patchy bilateral GGO opacities, free fluid and stranding about the distal ileum, calcified plaque causing multifocal at least moderate and likely advanced narrowing in SMA, and ileocolonic anastomosis and dilated main PA measuring 39 mm.  General surgery consulted.  Started on IV Zosyn , and admitted.   The next day, evaluated by general surgery who gave  wound care instructions.  Antibiotic de-escalated to ceftriaxone .  Zithromax  added for atypical coverage.  TTE and LE venous Doppler without significant finding.    On the day of discharge, patient's respiratory symptoms improved.  She is discharged on p.o. Augmentin  and Zithromax  to complete treatment course for pneumonia.  Discontinued HCTZ given his renal function.  Advised to hold Lasix  for about 3 days until she recovers from acute illness.   See individual problem list below for more.   Problems addressed during this hospitalization Acute respiratory failure with hypoxia due to multifocal pneumonia: POA.  Desaturated to 88% but recovered to 90s on 4 L.  CT chest with patchy bilateral groundglass opacities.COVID-19, influenza and RSV PCR nonreactive.  Clinically low suspicion for aspiration.  Improved.  Maintain saturation in upper 90s. -IV Zosyn  12/30>> ceftriaxone  and Zithromax  12/31-1/2.  -Discharged on p.o. Augmentin  for 3 more days and Zithromax  for 2 more days. -Albuterol  as needed -Continue Mucinex  -Advised good hydration and holding diuretics until she recovers -Encourage oral hydration -HH PT   History of colonic mass -S/p robotic assisted partial right colectomy by general surgery Dr. Debby on 06/08/2023 previous hospitalization. -CT showed free fluid and stranding about the distal ileum and ileocolonic anastomosis.  -Evaluated by general surgery-recommended wound care.   -See wound care instruction by surgery and wound care nurse below.   Uncontrolled hypertension: No chest pain or focal neurosymptoms. -Continue with home medicine except HCTZ -Advised to hold Lasix  for 3 to 4 days.   Uncontrolled IDDM-2 with hyperglycemia: A1c 8.5% (was 7.3% on 9/17).  CBG  within good range. -Continue home Lantus  and Farxiga   CKD-3B: Stable Recent Labs    05/09/23 1430 06/01/23 1426 06/09/23 0432 06/10/23 0628 06/11/23 0523 06/12/23 0857 06/26/23 1517 06/26/23 2158  06/27/23 0623 06/28/23 0537 06/29/23 0537  BUN 33* 29* 32* 35* 34* 26* 19  --  25* 33* 26*  CREATININE 1.94* 1.51* 2.28* 2.75* 2.18* 1.56* 1.41* 1.57* 1.29* 1.63* 1.53*  -Discontinued HCTZ -Recheck renal function in 1 week  Anemia of renal disease: H&H relatively stable.  No overt bleeding. Recent Labs    11/07/22 1606 06/01/23 1426 06/09/23 0432 06/10/23 0628 06/11/23 0523 06/26/23 1517 06/26/23 2158 06/27/23 0623 06/28/23 0840 06/29/23 0537  HGB 10.0* 11.0* 9.9* 9.0* 8.9* 9.7* 8.6* 9.1* 8.6* 8.3*  -Recheck CBC at follow-up  Chronic HFpEF: Appears euvolemic.  TTE with LVEF of 60 to 65% and indeterminate DD.  BNP 225. -Continue home meds.  -Advised to hold Lasix  for 3 more days   History of papillary fibroelastoma: s/p mitral valve repair.  No significant finding on TTE.   Elevated troponin: Mild.  Likely demand ischemia in the setting of uncontrolled hypertension and pneumonia.  TTE reassuring. -Continue home Lipitor , aspirin  and antihypertensive meds.   Diabetic polyneuropathy -Continue home gabapentin  and Cymbalta    History of OSA: Not compliant with CPAP -Encourage use   History of breast cancer -Resume home Femara    GERD -Continue PPI   Bilateral lower extremity edema?  Did not appreciate on exam.  Lower extremity venous Doppler negative.   Generalized weakness -PT/OT-HH PT   Morbid obesity: Elevated BMI with comorbidity as above. Body mass index is 39.55 kg/m. -Encourage lifestyle change to lose weight              Time spent 35 minutes  Vital signs Vitals:   06/28/23 2149 06/29/23 0419 06/29/23 0500 06/29/23 0836  BP: (!) 162/62 (!) 161/68    Pulse:  68    Temp:  98.2 F (36.8 C)    Resp:  18    Height:      Weight:   118 kg   SpO2:  97%  97%  TempSrc:  Oral    BMI (Calculated):   39.56      Discharge exam  GENERAL: No apparent distress.  Nontoxic. HEENT: MMM.  Vision and hearing grossly intact.  NECK: Supple.  No apparent  JVD.  RESP:  No IWOB.  Fair aeration bilaterally. CVS:  RRR. Heart sounds normal.  ABD/GI/GU: BS+. Abd soft, NTND.  MSK/EXT:  Moves extremities. No apparent deformity. No edema.  SKIN: Suprapubic skin wound from prior laparoscopic site.  No signs of infection.  See pictures in the media NEURO: Awake and alert. Oriented appropriately.  No apparent focal neuro deficit. PSYCH: Calm. Normal affect.   Discharge Instructions Discharge Instructions     Diet - low sodium heart healthy   Complete by: As directed    Diet Carb Modified   Complete by: As directed    Discharge instructions   Complete by: As directed    It has been a pleasure taking care of you!  You were hospitalized due to pneumonia for which you have been treated with antibiotics.  Your symptoms improved.  We are discharging you more antibiotics to complete treatment course.  Maintain good hydration.  Take your medications as prescribed.  Please review your new medication list and the directions on your medications before you take them.  Follow-up with your primary care doctor in 1 to 2 weeks or sooner  if needed.  Follow-up with general surgery per the recommendation.  Follow-up with your cardiologist as previously planned.   Take care,   Discharge wound care:   Complete by: As directed    Suprapubic wound.  Wash with soap and water and cover with gauze and tape Wound care  Daily      Comments: Initiate Interdry to abdominal pannus.  Order Gerlean # [phone] Measure and cut length of InterDry to fit in skin folds that have skin breakdown Tuck InterDry fabric into skin folds in a single layer, allow for 2 inches of overhang from skin edges to allow for wicking to occur May remove to bathe; dry area thoroughly and then tuck into affected areas again Do not apply any creams or ointments when using InterDry DO NOT THROW AWAY FOR 5 DAYS unless soiled with stool DO NOT [facility] product, this will inactivate the silver in the material  New  sheet of Interdry should be applied after 5 days of use if patient continues to have skin breakdown   Increase activity slowly   Complete by: As directed       Allergies as of 06/29/2023   No Known Allergies      Medication List     PAUSE taking these medications    furosemide  20 MG tablet Wait to take this until: July 03, 2023 Commonly known as: LASIX  Take 20 mg by mouth in the morning.       STOP taking these medications    valsartan -hydrochlorothiazide  160-25 MG tablet Commonly known as: DIOVAN -HCT       TAKE these medications    albuterol  108 (90 Base) MCG/ACT inhaler Commonly known as: VENTOLIN  HFA Inhale 2 puffs into the lungs every 6 (six) hours as needed for wheezing or shortness of breath.   amLODipine  10 MG tablet Commonly known as: NORVASC  TAKE 1 TABLET EVERY DAY What changed: when to take this   amoxicillin -clavulanate 875-125 MG tablet Commonly known as: AUGMENTIN  Take 1 tablet by mouth 2 (two) times daily for 3 days. Start taking on: June 30, 2023   aspirin  EC 81 MG tablet Take 1 tablet (81 mg total) by mouth daily.   atorvastatin  80 MG tablet Commonly known as: LIPITOR  TAKE 1 TABLET EVERY DAY.   azithromycin  500 MG tablet Commonly known as: Zithromax  Take 1 tablet (500 mg total) by mouth daily for 2 days. Start taking on: June 30, 2023   buPROPion  150 MG 24 hr tablet Commonly known as: WELLBUTRIN  XL TAKE 1 TABLET TWICE DAILY What changed: when to take this   carvedilol  25 MG tablet Commonly known as: COREG  TAKE 1 TABLET TWICE DAILY What changed: when to take this   celecoxib  50 MG capsule Commonly known as: CeleBREX  Take 1 capsule (50 mg total) by mouth 2 (two) times daily as needed for pain.   diclofenac  Sodium 1 % Gel Commonly known as: VOLTAREN  APPLY 2 GRAMS TOPICALLY 4 (FOUR) TIMES DAILY AS NEEDED. What changed:  how much to take how to take this when to take this reasons to take this additional instructions    docusate sodium  100 MG capsule Commonly known as: COLACE Take 100 mg by mouth daily as needed for mild constipation.   DULoxetine  30 MG capsule Commonly known as: CYMBALTA  Take 1 capsule (30 mg total) by mouth daily. What changed: when to take this   Farxiga  10 MG Tabs tablet Generic drug: dapagliflozin  propanediol Take 10 mg by mouth daily with lunch.   FeroSul 325 (  65 Fe) MG tablet Generic drug: ferrous sulfate  Take 325 mg by mouth daily with breakfast.   gabapentin  300 MG capsule Commonly known as: Neurontin  Take 1 capsule (300 mg total) by mouth 3 (three) times daily. What changed:  when to take this reasons to take this   guaiFENesin  600 MG 12 hr tablet Commonly known as: Mucinex  Take 1 tablet (600 mg total) by mouth 2 (two) times daily for 5 days.   hydrALAZINE  100 MG tablet Commonly known as: APRESOLINE  TAKE 1 TABLET TWICE DAILY What changed:  how much to take how to take this when to take this additional instructions   HYDROcodone -acetaminophen  5-325 MG tablet Commonly known as: Norco Take 1 tablet by mouth every 6 (six) hours as needed for moderate pain (pain score 4-6).   insulin  glargine 100 UNIT/ML Solostar Pen Commonly known as: LANTUS  Inject 10 Units into the skin at bedtime.   letrozole  2.5 MG tablet Commonly known as: FEMARA  TAKE 1 TABLET EVERY DAY What changed:  how much to take how to take this when to take this additional instructions   methocarbamol  500 MG tablet Commonly known as: ROBAXIN  TAKE 1 TABLET EVERY 8 HOURS AS NEEDED FOR MUSCLE SPASMS. What changed: See the new instructions.   multivitamin with minerals Tabs tablet Take 1 tablet by mouth daily with breakfast.   pantoprazole  40 MG tablet Commonly known as: PROTONIX  Take 1 tablet (40 mg total) by mouth daily. What changed: when to take this   Pen Needles 31G X 8 MM Misc UAD   polyethylene glycol powder 17 GM/SCOOP powder Commonly known as: GLYCOLAX /MIRALAX  Take 17 g  by mouth daily as needed for mild constipation.   traMADol  50 MG tablet Commonly known as: ULTRAM  Take 1 tablet (50 mg total) by mouth every 12 (twelve) hours as needed. What changed: reasons to take this   traZODone  50 MG tablet Commonly known as: DESYREL  TAKE 1 TABLET AT BEDTIME AS NEEDED FOR SLEEP What changed:  when to take this additional instructions   valsartan  160 MG tablet Commonly known as: Diovan  Take 1 tablet (160 mg total) by mouth daily.               Discharge Care Instructions  (From admission, onward)           Start     Ordered   06/29/23 0000  Discharge wound care:       Comments: Suprapubic wound.  Wash with soap and water and cover with gauze and tape Wound care  Daily      Comments: Initiate Interdry to abdominal pannus.  Order Gerlean # [phone] Measure and cut length of InterDry to fit in skin folds that have skin breakdown Tuck InterDry fabric into skin folds in a single layer, allow for 2 inches of overhang from skin edges to allow for wicking to occur May remove to bathe; dry area thoroughly and then tuck into affected areas again Do not apply any creams or ointments when using InterDry DO NOT THROW AWAY FOR 5 DAYS unless soiled with stool DO NOT [facility] product, this will inactivate the silver in the material  New sheet of Interdry should be applied after 5 days of use if patient continues to have skin breakdown   06/29/23 1022            Consultations: General surgery  Procedures/Studies:   VAS US  LOWER EXTREMITY VENOUS (DVT) Result Date: 06/28/2023  Lower Venous DVT Study Patient Name:  [name]  Date of Exam:   06/27/2023 Medical Rec #: [id number]         Accession #:    [id number] Date of Birth: 03-Jul-1950        Patient Gender: F Patient Age:   36 years Exam Location:  [facility] Procedure:      VAS US  LOWER EXTREMITY VENOUS (DVT) Referring Phys: TERRY HALL  --------------------------------------------------------------------------------  Indications: Swelling, and Edema.  Risk Factors: Cancer Breast Surgery Partial Colectomy 06/08/23 past pregnancy and obesity. Comparison Study: None. Performing Technologist: Garnette Rockers  Examination Guidelines: A complete evaluation includes B-mode imaging, spectral Doppler, color Doppler, and power Doppler as needed of all accessible portions of each vessel. Bilateral testing is considered an integral part of a complete examination. Limited examinations for reoccurring indications may be performed as noted. The reflux portion of the exam is performed with the patient in reverse Trendelenburg.  +---------+---------------+---------+-----------+----------+--------------+ RIGHT    CompressibilityPhasicitySpontaneityPropertiesThrombus Aging +---------+---------------+---------+-----------+----------+--------------+ CFV      Full           Yes      Yes                                 +---------+---------------+---------+-----------+----------+--------------+ SFJ      Full                                                        +---------+---------------+---------+-----------+----------+--------------+ FV Prox  Full                                                        +---------+---------------+---------+-----------+----------+--------------+ FV Mid   Full                                                        +---------+---------------+---------+-----------+----------+--------------+ FV DistalFull                                                        +---------+---------------+---------+-----------+----------+--------------+ PFV      Full                                                        +---------+---------------+---------+-----------+----------+--------------+ POP      Full           Yes      Yes                                  +---------+---------------+---------+-----------+----------+--------------+ PTV      Full                                                        +---------+---------------+---------+-----------+----------+--------------+  PERO     Full                                                        +---------+---------------+---------+-----------+----------+--------------+   +---------+---------------+---------+-----------+----------+--------------+ LEFT     CompressibilityPhasicitySpontaneityPropertiesThrombus Aging +---------+---------------+---------+-----------+----------+--------------+ CFV      Full           Yes      Yes                                 +---------+---------------+---------+-----------+----------+--------------+ SFJ      Full                                                        +---------+---------------+---------+-----------+----------+--------------+ FV Prox  Full                                                        +---------+---------------+---------+-----------+----------+--------------+ FV Mid   Full                                                        +---------+---------------+---------+-----------+----------+--------------+ FV DistalFull                                                        +---------+---------------+---------+-----------+----------+--------------+ PFV      Full                                                        +---------+---------------+---------+-----------+----------+--------------+ POP      Full           Yes      Yes                                 +---------+---------------+---------+-----------+----------+--------------+ PTV      Full                                                        +---------+---------------+---------+-----------+----------+--------------+ PERO     Full                                                         +---------+---------------+---------+-----------+----------+--------------+  Summary: BILATERAL: - No evidence of deep vein thrombosis seen in the lower extremities, bilaterally. -No evidence of popliteal cyst, bilaterally.   *See table(s) above for measurements and observations. Electronically signed by Penne Colorado MD on 06/28/2023 at 10:30:18 AM.    Final    ECHOCARDIOGRAM COMPLETE Result Date: 06/27/2023    ECHOCARDIOGRAM REPORT   Patient Name:   [name] Date of Exam: 06/27/2023 Medical Rec #:  [id number]        Height:       68.0 in Accession #:    [id number]       Weight:       260.0 lb Date of Birth:  1950-12-02       BSA:          2.285 m Patient Age:    14 years         BP:           193/67 mmHg Patient Gender: F                HR:           74 bpm. Exam Location:  Inpatient Procedure: 2D Echo, Cardiac Doppler and Color Doppler Indications:    Elevated Troponin  History:        Patient has prior history of Echocardiogram examinations, most                 recent 03/03/2020. CAD; Risk Factors:Hypertension, Diabetes and                 Former Smoker.                  Mitral Valve: valve is present in the mitral position.  Sonographer:    Ozell Free Referring Phys: [id number] CAROLE N HALL IMPRESSIONS  1. Left ventricular ejection fraction, by estimation, is 60 to 65%. The left ventricle has normal function. The left ventricle has no regional wall motion abnormalities. There is mild concentric left ventricular hypertrophy. Left ventricular diastolic parameters are indeterminate.  2. Right ventricular systolic function is normal. The right ventricular size is normal. There is moderately elevated pulmonary artery systolic pressure.  3. The mitral valve is normal in structure. Trivial mitral valve regurgitation. No evidence of mitral stenosis. There is a present in the mitral position.  4. The aortic valve is normal in structure. There is mild calcification of the aortic valve. Aortic valve  regurgitation is not visualized. No aortic stenosis is present.  5. The inferior vena cava is normal in size with greater than 50% respiratory variability, suggesting right atrial pressure of 3 mmHg. FINDINGS  Left Ventricle: Left ventricular ejection fraction, by estimation, is 60 to 65%. The left ventricle has normal function. The left ventricle has no regional wall motion abnormalities. The left ventricular internal cavity size was normal in size. There is  mild concentric left ventricular hypertrophy. Left ventricular diastolic parameters are indeterminate. Right Ventricle: The right ventricular size is normal. No increase in right ventricular wall thickness. Right ventricular systolic function is normal. There is moderately elevated pulmonary artery systolic pressure. The tricuspid regurgitant velocity is 3.11 m/s, and with an assumed right atrial pressure of 8 mmHg, the estimated right ventricular systolic pressure is 46.7 mmHg. Left Atrium: Left atrial size was normal in size. Right Atrium: Right atrial size was normal in size. Pericardium: There is no evidence of pericardial effusion. Mitral Valve: The mitral valve is normal in structure. Trivial mitral valve regurgitation. There is  a present in the mitral position. No evidence of mitral valve stenosis. MV peak gradient, 7.7 mmHg. The mean mitral valve gradient is 4.0 mmHg. Tricuspid Valve: The tricuspid valve is normal in structure. Tricuspid valve regurgitation is not demonstrated. No evidence of tricuspid stenosis. Aortic Valve: The aortic valve is normal in structure. There is mild calcification of the aortic valve. Aortic valve regurgitation is not visualized. No aortic stenosis is present. Aortic valve mean gradient measures 7.0 mmHg. Aortic valve peak gradient measures 13.2 mmHg. Aortic valve area, by VTI measures 2.70 cm. Pulmonic Valve: The pulmonic valve was normal in structure. Pulmonic valve regurgitation is trivial. No evidence of pulmonic  stenosis. Aorta: The aortic root is normal in size and structure. Venous: The inferior vena cava is normal in size with greater than 50% respiratory variability, suggesting right atrial pressure of 3 mmHg. IAS/Shunts: No atrial level shunt detected by color flow Doppler.  LEFT VENTRICLE PLAX 2D LVIDd:         4.50 cm   Diastology LVIDs:         2.10 cm   LV e' medial:    5.44 cm/s LV PW:         1.20 cm   LV E/e' medial:  23.3 LV IVS:        1.10 cm   LV e' lateral:   7.07 cm/s LVOT diam:     2.00 cm   LV E/e' lateral: 18.0 LV SV:         96 LV SV Index:   42 LVOT Area:     3.14 cm  RIGHT VENTRICLE            IVC RV Basal diam:  3.50 cm    IVC diam: 2.10 cm RV S prime:     6.96 cm/s TAPSE (M-mode): 2.0 cm LEFT ATRIUM             Index        RIGHT ATRIUM           Index LA diam:        4.80 cm 2.10 cm/m   RA Area:     17.10 cm LA Vol (A2C):   45.1 ml 19.74 ml/m  RA Volume:   47.80 ml  20.92 ml/m LA Vol (A4C):   30.7 ml 13.44 ml/m LA Biplane Vol: 37.2 ml 16.28 ml/m  AORTIC VALVE AV Area (Vmax):    2.30 cm AV Area (Vmean):   2.40 cm AV Area (VTI):     2.70 cm AV Vmax:           182.00 cm/s AV Vmean:          121.000 cm/s AV VTI:            0.355 m AV Peak Grad:      13.2 mmHg AV Mean Grad:      7.0 mmHg LVOT Vmax:         133.00 cm/s LVOT Vmean:        92.300 cm/s LVOT VTI:          0.305 m LVOT/AV VTI ratio: 0.86  AORTA Ao Root diam: 3.10 cm Ao Asc diam:  2.50 cm MITRAL VALVE                TRICUSPID VALVE MV Area (PHT): 3.02 cm     TR Peak grad:   38.7 mmHg MV Area VTI:   2.78 cm     TR Vmax:  311.00 cm/s MV Peak grad:  7.7 mmHg MV Mean grad:  4.0 mmHg     SHUNTS MV Vmax:       1.39 m/s     Systemic VTI:  0.30 m MV Vmean:      91.5 cm/s    Systemic Diam: 2.00 cm MV Decel Time: 251 msec MR Peak grad: 88.7 mmHg MR Vmax:      471.00 cm/s MV E velocity: 127.00 cm/s MV A velocity: 99.00 cm/s MV E/A ratio:  1.28 Aditya Sabharwal Electronically signed by Ria Commander Signature Date/Time:  06/27/2023/4:30:58 PM    Final    CT CHEST ABDOMEN PELVIS W CONTRAST Result Date: 06/26/2023 CLINICAL DATA:  Concern for atypical pneumonia, potential postoperative abdominal infection, purulent drainage from the lower abdominal incision site, cough, shortness of breath, malaise, myalgia. Bowel surgery 06/08/2023 EXAM: CT CHEST, ABDOMEN, AND PELVIS WITH CONTRAST TECHNIQUE: Multidetector CT imaging of the chest, abdomen and pelvis was performed following the standard protocol during bolus administration of intravenous contrast. RADIATION DOSE REDUCTION: This exam was performed according to the departmental dose-optimization program which includes automated exposure control, adjustment of the mA and/or kV according to patient size and/or use of iterative reconstruction technique. CONTRAST:  80mL OMNIPAQUE  IOHEXOL  300 MG/ML  SOLN COMPARISON:  Same day chest radiograph; CT abdomen and pelvis 03/08/2023; CTA chest 12/24/2013 FINDINGS: CT CHEST FINDINGS Cardiovascular: No pericardial effusion. Sternotomy and CABG. Coronary artery and aortic atherosclerotic calcification. Normal caliber thoracic aorta. Dilated main pulmonary artery measuring 39 mm. Mediastinum/Nodes: Trachea and esophagus are unremarkable. 1.2 cm pretracheal node on series 2/image 23 is favored reactive. Lungs/Pleura: Small bowel bilateral pleural effusions. Patchy ground-glass opacities throughout both lungs greatest in the right upper lobe posteriorly. Peribronchovascular atelectasis or consolidation in the lower lobes. No pneumothorax. Musculoskeletal: No acute fracture. CT ABDOMEN PELVIS FINDINGS Hepatobiliary: No acute abnormality. Pancreas: Unremarkable. Spleen: Unremarkable. Adrenals/Urinary Tract: Stable adrenal glands. Vascular calcifications in both renal hila. No urinary calculi or hydronephrosis. Unremarkable bladder. Stomach/Bowel: Interval postoperative change about the right colon with ileocolonic anastomosis in the right upper quadrant.  Question poor enhancement of the neoterminal ileum (circa series 8/image 89). Mucosal hyperenhancement about the colon at the hepatic flexure. Free fluid and stranding about the ileocolonic anastomosis. Free fluid extends inferiorly into the right pelvis. No organized fluid collection or abscess. No free air. Stomach is within normal limits.  No bowel obstruction. Vascular/Lymphatic: Advanced atherosclerotic calcification of the aorta in its mesenteric branches. There is multifocal at least moderate and likely advanced narrowing in the SMA (circa series 2/image 66 and 2/73). No enlarged abdominal or pelvic lymph nodes. Reproductive: Hysterectomy.  No suspicious adnexal lesion. Other: No free intraperitoneal air. Musculoskeletal: No acute fracture. Edema and stranding about the rectus abdominis musculature. Stranding in the intraperitoneal fat deep to the rectus abdominis musculature as well as the subcutaneous fat. Fluid collection in the low midline anterior abdomen measures 1.5 x 5.8 cm. Mild peripheral hyperenhancement and stranding. IMPRESSION: 1. Interval postoperative change about the right colon with ileocolonic anastomosis in the right upper quadrant. 2. Question poor enhancement of the neoterminal ileum. Mucosal hyperenhancement within the proximal remnant colon. Free fluid and stranding about the distal ileum and ileocolonic anastomosis. Findings are nonspecific but can be seen with developing ischemia. Differential considerations include expected postoperative change or infectious/inflammatory enterocolitis. 3. Edema and stranding about the rectus abdominis musculature. Small fluid collection in the low midline anterior abdomen. Findings favor postoperative inflammation and edema with or without superimposed infection. 4. Patchy bilateral  ground-glass opacities suggestive of atypical pneumonia. 5. Dilated main pulmonary artery measuring 39 mm, which can be seen in the setting of pulmonary hypertension. 6.  Calcified plaque causes multifocal at least moderate and likely advanced narrowing in the SMA. Aortic Atherosclerosis (ICD10-I70.0). These results were called by telephone at the time of interpretation on 06/26/2023 at 8:17 pm to provider MATTHEW TRIFAN , who verbally acknowledged these results. Electronically Signed   By: Norman Gatlin M.D.   On: 06/26/2023 20:19   DG Chest 2 View Result Date: 06/26/2023 CLINICAL DATA:  Shortness of breath.  Evaluate for pneumonia. EXAM: CHEST - 2 VIEW COMPARISON:  Chest radiographs 06/22/2022 and 06/23/2021 FINDINGS: Status post median sternotomy and CABG. Cardiac silhouette is at the upper limits of normal size, unchanged. Mediastinal contours are within normal limits. Mild bilateral diffuse interstitial thickening is similar to prior. No focal airspace opacity to indicate pneumonia. No pleural effusion pneumothorax. Mild multilevel degenerative disc changes of the thoracic spine. IMPRESSION: 1. No focal airspace opacity to indicate pneumonia. 2. Mild bilateral diffuse interstitial thickening is similar to prior, likely chronic interstitial lung disease. Possible mild superimposed interstitial pulmonary edema. Electronically Signed   By: Tanda Lyons M.D.   On: 06/26/2023 16:01       The results of significant diagnostics from this hospitalization (including imaging, microbiology, ancillary and laboratory) are listed below for reference.     Microbiology: Recent Results (from the past 240 hours)  Resp panel by RT-PCR (RSV, Flu A&B, Covid) Anterior Nasal Swab     Status: None   Collection Time: 06/26/23  3:35 PM   Specimen: Anterior Nasal Swab  Result Value Ref Range Status   SARS Coronavirus 2 by RT PCR NEGATIVE NEGATIVE Final    Comment: (NOTE) SARS-CoV-2 target nucleic acids are NOT DETECTED.  The SARS-CoV-2 RNA is generally detectable in upper respiratory specimens during the acute phase of infection. The lowest concentration of SARS-CoV-2 viral  copies this assay can detect is 138 copies/mL. A negative result does not preclude SARS-Cov-2 infection and should not be used as the sole basis for treatment or other patient management decisions. A negative result may occur with  improper specimen collection/handling, submission of specimen other than nasopharyngeal swab, presence of viral mutation(s) within the areas targeted by this assay, and inadequate number of viral copies(<138 copies/mL). A negative result must be combined with clinical observations, patient history, and epidemiological information. The expected result is Negative.  Fact Sheet for Patients:  bloggercourse.com  Fact Sheet for Healthcare Providers:  seriousbroker.it  This test is no t yet approved or cleared by the [place]  FDA and  has been authorized for detection and/or diagnosis of SARS-CoV-2 by FDA under an Emergency Use Authorization (EUA). This EUA will remain  in effect (meaning this test can be used) for the duration of the COVID-19 declaration under Section 564(b)(1) of the Act, 21 U.S.C.section 360bbb-3(b)(1), unless the authorization is terminated  or revoked sooner.       Influenza A by PCR NEGATIVE NEGATIVE Final   Influenza B by PCR NEGATIVE NEGATIVE Final    Comment: (NOTE) The Xpert Xpress SARS-CoV-2/FLU/RSV plus assay is intended as an aid in the diagnosis of influenza from Nasopharyngeal swab specimens and should not be used as a sole basis for treatment. Nasal washings and aspirates are unacceptable for Xpert Xpress SARS-CoV-2/FLU/RSV testing.  Fact Sheet for Patients: bloggercourse.com  Fact Sheet for Healthcare Providers: seriousbroker.it  This test is not yet approved or cleared by the [place]  [place] FDA and has been authorized for detection and/or diagnosis of SARS-CoV-2 by FDA under an Emergency Use Authorization (EUA). This  EUA will remain in effect (meaning this test can be used) for the duration of the COVID-19 declaration under Section 564(b)(1) of the Act, 21 U.S.C. section 360bbb-3(b)(1), unless the authorization is terminated or revoked.     Resp Syncytial Virus by PCR NEGATIVE NEGATIVE Final    Comment: (NOTE) Fact Sheet for Patients: bloggercourse.com  Fact Sheet for Healthcare Providers: seriousbroker.it  This test is not yet approved or cleared by the [place]  FDA and has been authorized for detection and/or diagnosis of SARS-CoV-2 by FDA under an Emergency Use Authorization (EUA). This EUA will remain in effect (meaning this test can be used) for the duration of the COVID-19 declaration under Section 564(b)(1) of the Act, 21 U.S.C. section 360bbb-3(b)(1), unless the authorization is terminated or revoked.  Performed at [facility], [address] [place], [place] [address]      Labs:  CBC: Recent Labs  Lab 06/26/23 1517 06/26/23 2158 06/27/23 0623 06/28/23 0840 06/29/23 0537  WBC 7.4 6.6 5.5 5.2 4.7  NEUTROABS 5.8  --   --   --   --   HGB 9.7* 8.6* 9.1* 8.6* 8.3*  HCT 31.7* 28.8* 28.7* 27.6* 27.2*  MCV 79.6* 81.4 79.7* 79.3* 80.5  PLT 300 280 272 281 263   BMP &GFR Recent Labs  Lab 06/26/23 1517 06/26/23 2158 06/27/23 0623 06/28/23 0537 06/29/23 0537  NA 139  --  138 137 133*  K 4.4  --  5.0 4.4 4.3  CL 106  --  107 111 109  CO2 25  --  21* 18* 19*  GLUCOSE 103*  --  220* 174* 80  BUN 19  --  25* 33* 26*  CREATININE 1.41* 1.57* 1.29* 1.63* 1.53*  CALCIUM  9.1  --  8.9 8.1* 8.0*  MG  --   --  2.5* 2.6* 2.5*  PHOS  --   --  5.0* 3.4 3.7   Estimated Creatinine Clearance: 43.5 mL/min (A) (by C-G formula based on SCr of 1.53 mg/dL (H)). Liver & Pancreas: Recent Labs  Lab 06/28/23 0537 06/29/23 0537  ALBUMIN 2.8* 2.6*   No results for input(s): LIPASE, AMYLASE in the last 168  hours. No results for input(s): AMMONIA in the last 168 hours. Diabetic: Recent Labs    06/27/23 1423  HGBA1C 8.5*   Recent Labs  Lab 06/28/23 0735 06/28/23 1122 06/28/23 1643 06/28/23 2017 06/29/23 0744  GLUCAP 134* 205* 141* 124* 79   Cardiac Enzymes: No results for input(s): CKTOTAL, CKMB, CKMBINDEX, TROPONINI in the last 168 hours. No results for input(s): PROBNP in the last 8760 hours. Coagulation Profile: No results for input(s): INR, PROTIME in the last 168 hours. Thyroid  Function Tests: No results for input(s): TSH, T4TOTAL, FREET4, T3FREE, THYROIDAB in the last 72 hours. Lipid Profile: No results for input(s): CHOL, HDL, LDLCALC, TRIG, CHOLHDL, LDLDIRECT in the last 72 hours. Anemia Panel: No results for input(s): VITAMINB12, FOLATE, FERRITIN, TIBC, IRON , RETICCTPCT in the last 72 hours. Urine analysis:    Component Value Date/Time   COLORURINE YELLOW 04/15/2021 2000   APPEARANCEUR CLEAR 04/15/2021 2000   LABSPEC 1.010 04/15/2021 2000   PHURINE 6.0 04/15/2021 2000   GLUCOSEU NEGATIVE 04/15/2021 2000   GLUCOSEU NEGATIVE 10/14/2011 1440   HGBUR TRACE (A) 04/15/2021 2000   BILIRUBINUR NEGATIVE 04/15/2021 2000   KETONESUR NEGATIVE 04/15/2021 2000   PROTEINUR 30 (A)  04/15/2021 2000   UROBILINOGEN 0.2 10/14/2011 1440   NITRITE POSITIVE (A) 04/15/2021 2000   LEUKOCYTESUR NEGATIVE 04/15/2021 2000   Sepsis Labs: Invalid input(s): PROCALCITONIN, LACTICIDVEN   SIGNED:  Brandan Glauber T Loris Seelye, MD  [facility] 06/29/2023, 4:44 PM

## 2023-06-30 ENCOUNTER — Other Ambulatory Visit: Payer: Self-pay

## 2023-06-30 ENCOUNTER — Telehealth: Payer: Self-pay

## 2023-06-30 ENCOUNTER — Telehealth: Payer: Self-pay | Admitting: Internal Medicine

## 2023-06-30 ENCOUNTER — Other Ambulatory Visit: Payer: Self-pay | Admitting: Internal Medicine

## 2023-06-30 NOTE — Patient Outreach (Signed)
  Care Management  Transitions of Care Program Transitions of Care Post-discharge Re-enrollment d/t re-adm to hospital 12/30-1/2  06/30/2023 Name: [name] MRN: [id number] DOB: [birth year]  Subjective: [name] is a 73 y.o. year old female who is a primary care patient of Vicci Barnie NOVAK, MD. The Care Management team was unable to reach the patient by phone to assess and address transitions of care needs.   Plan: Additional outreach attempts will be made to reach the patient enrolled in the [facility] Program (Post Inpatient/ED Visit).  Channing Larry, RN, BA, [facility], CRRN [facility] Coordinator, Transition of Care Ph # [phone]

## 2023-06-30 NOTE — Telephone Encounter (Signed)
 Pt. Calling to verify that she is to stop Diovan as instructed at discharge from hospital. Verified with discharge instructions with pt. Verbalizes understanding.

## 2023-06-30 NOTE — Patient Outreach (Signed)
  Care Management  Transitions of Care Program Transitions of Care Post-discharge TOC30d re-enrollment  06/30/2023 Name: [name] MRN: [id number] DOB: 1950/07/07  Subjective: [name] is a 73 y.o. year old female who is a primary care patient of Vicci Barnie NOVAK, MD. The Care Management team was unable to reach the patient by phone to assess and address transitions of care needs.   Plan: Additional outreach attempts will be made to reach the patient enrolled in the [facility] Program (Post Inpatient/ED Visit).  Channing Larry, RN, BA, [facility], CRRN [facility] Coordinator, Transition of Care Ph # [phone]

## 2023-07-01 ENCOUNTER — Telehealth: Payer: Self-pay | Admitting: Internal Medicine

## 2023-07-01 DIAGNOSIS — J449 Chronic obstructive pulmonary disease, unspecified: Secondary | ICD-10-CM | POA: Diagnosis not present

## 2023-07-01 DIAGNOSIS — E1142 Type 2 diabetes mellitus with diabetic polyneuropathy: Secondary | ICD-10-CM | POA: Diagnosis not present

## 2023-07-01 DIAGNOSIS — I129 Hypertensive chronic kidney disease with stage 1 through stage 4 chronic kidney disease, or unspecified chronic kidney disease: Secondary | ICD-10-CM | POA: Diagnosis not present

## 2023-07-01 DIAGNOSIS — E1122 Type 2 diabetes mellitus with diabetic chronic kidney disease: Secondary | ICD-10-CM | POA: Diagnosis not present

## 2023-07-01 DIAGNOSIS — N1832 Chronic kidney disease, stage 3b: Secondary | ICD-10-CM | POA: Diagnosis not present

## 2023-07-01 DIAGNOSIS — D631 Anemia in chronic kidney disease: Secondary | ICD-10-CM | POA: Diagnosis not present

## 2023-07-01 DIAGNOSIS — K219 Gastro-esophageal reflux disease without esophagitis: Secondary | ICD-10-CM | POA: Diagnosis not present

## 2023-07-01 DIAGNOSIS — Z794 Long term (current) use of insulin: Secondary | ICD-10-CM | POA: Diagnosis not present

## 2023-07-01 DIAGNOSIS — Z48815 Encounter for surgical aftercare following surgery on the digestive system: Secondary | ICD-10-CM | POA: Diagnosis not present

## 2023-07-01 NOTE — Telephone Encounter (Signed)
 Stop Celebrex given her kidney function.

## 2023-07-03 ENCOUNTER — Telehealth: Payer: Self-pay

## 2023-07-03 ENCOUNTER — Other Ambulatory Visit: Payer: Self-pay

## 2023-07-03 ENCOUNTER — Telehealth: Payer: Self-pay | Admitting: Internal Medicine

## 2023-07-03 NOTE — Telephone Encounter (Signed)
 PT Called said she wanted Dr. Laural Benes to call her regarding OZEMPIC  she needs to approved

## 2023-07-03 NOTE — Transitions of Care (Post Inpatient/ED Visit) (Signed)
   07/03/2023  Name: [name] MRN: [id number] DOB: 14-Sep-1950  Today's TOC FU Call Status: Today's TOC FU Call Status:: Successful TOC FU Call Completed TOC FU Call Complete Date: 07/03/23 Patient's Name and Date of Birth confirmed.  Transition Care Management Follow-up Telephone Call Date of Discharge: 06/29/23 Discharge Facility: [facility]) Type of Discharge: Inpatient Admission Primary Inpatient Discharge Diagnosis:: pneumonia How have you been since you were released from the hospital?: Better (She said she is breathing a lot better.) Any questions or concerns?: Yes Patient Questions/Concerns:: She would like to know if she can start back on the ozempic  Patient Questions/Concerns Addressed: Notified Provider of Patient Questions/Concerns  Items Reviewed: Did you receive and understand the discharge instructions provided?: Yes Medications obtained,verified, and reconciled?: Partial Review Completed Reason for Partial Mediation Review: She said she has all of her medications and did not have any questions about the med regime and did not need to review the med list. She also has a glucometer Any new allergies since your discharge?: No Dietary orders reviewed?: Yes Type of Diet Ordered:: heart healthy, low sodium, carb modified Do you have support at home?: Yes People in Home: child(ren), adult, spouse Name of Support/Comfort Primary Source: her husband, son and grandson help  her as needed  Medications Reviewed Today: Medications Reviewed Today   Medications were not reviewed in this encounter     Home Care and Equipment/Supplies: Were Home Health Services Ordered?: Yes Name of Home Health Agency:: [facility] Has Agency set up a time to come to your home?: Yes First Home Health Visit Date: 07/02/23 Any new equipment or medical supplies ordered?: No  Functional Questionnaire: Do you need assistance with bathing/showering or dressing?: No (She stated that the  abdominal dressing is intact and she has been keeping it dry. She plans to follow up with the surgeon to have the area assessed.) Do you need assistance with meal preparation?: Yes (her family assists) Do you need assistance with eating?: No Do you have difficulty maintaining continence: No Do you need assistance with getting out of bed/getting out of a chair/moving?: Yes (She ambulates with a cane but also has a walker, rollator to use if needed) Do you have difficulty managing or taking your medications?: No  Follow up appointments reviewed: PCP Follow-up appointment confirmed?: Yes Date of PCP follow-up appointment?: 07/17/23 Follow-up Provider: [facility] Follow-up appointment confirmed?: Yes Date of Specialist follow-up appointment?: 07/04/23 Follow-Up Specialty Provider:: surgeon; but she thinks she may call and re-schedule due to the bad weather. 08/09/2023-neurology; 09/21/2023- cardiology Do you need transportation to your follow-up appointment?: No Do you understand care options if your condition(s) worsen?: Yes-patient verbalized understanding    SIGNATURE. Slater Diesel, RN

## 2023-07-03 NOTE — Patient Instructions (Signed)
 Visit Information  Thank you for taking time to visit with me today. Please don't hesitate to contact me if I can be of assistance to you before our next scheduled telephone appointment.  Our next appointment is by telephone on 07/10/23 at 1pm.  Following is a copy of your care plan:   Goals Addressed             This Visit's Progress    Transition of Care       Current Barriers:  Knowledge Deficits related to plan of care for management of post operative pain, recuperation needs & return to previous level of physical functioning after undergoing a laparoscopic partial colectomy to remove a colonic mass.  RNCM Clinical Goal(s):  Patient will work with the Care Management team over the next 30 days to address Transition of Care Barriers: Medication Management Diet/Nutrition/Food Resources Support at home Provider appointments Home Health services Equipment/DME Functional/Safety through collaboration with RN Care manager, provider, and care team.   Interventions: Evaluation of current treatment plan related to  self management and patient's adherence to plan as established by provider   Pain Interventions:  (Status:  Ongoing) Short Term Goal Pain assessment performed Medications reviewed Reviewed provider established plan for pain management Discussed importance of adherence to all scheduled medical appointments Advised patient to report to care team affect of pain on daily activities Discussed use of relaxation techniques and/or diversional activities to assist with pain reduction (distraction, imagery, relaxation, massage, acupressure, TENS, heat, and cold application Reviewed with patient prescribed pharmacological and nonpharmacological pain relief strategies Assessed social determinant of health barriers   Surgery (Laparoscopic partial colectomy):  (Status: Ongoing) Short Term Goal Evaluation of current treatment plan related to Colon surgery assessed patient/caregiver  understanding of surgical procedure   reviewed post-operative instructions with patient/caregiver addressed questions about post - surgical incision care  reviewed medications with patient and addressed questions reviewed scheduled provider appointments with patientHas Post operative follow up with Bernarda Ned on 07/04/23 @ 11:30am 07/03/23 Update - Patient was readmitted to [facility] on 06/26/23-06/29/23 for pneumonia and re-enrolled in TOC30d program on 1/6. Patient has several follow-ups including 07/17/23 with PCP Dr. Barnie Louder, and 2/12 appt with Neurology Duwaine Ahle for nerve conduction study and EMG confirmed availability of transportation to all appointments Patient does not have any transportation challenges getting to appointments  Patient Goals/Self-Care Activities: Participate in Transition of Care Program/Attend TOC scheduled calls Take all medications as prescribed Attend all scheduled provider appointments Call pharmacy for medication refills 3-7 days in advance of running out of medications Perform all self care activities independently  Call provider office for new concerns or questions   Follow Up Plan:  The patient has been provided with contact information for the care management team and has been advised to call with any health related questions or concerns.          Patient verbalizes understanding of instructions and care plan provided today and agrees to view in MyChart. Active MyChart status and patient understanding of how to access instructions and care plan via MyChart confirmed with patient.     The patient has been provided with contact information for the care management team and has been advised to call with any health related questions or concerns.   Please call the care guide team at [phone] if you need to cancel or reschedule your appointment.   Please call 1-800-273-TALK (toll free, 24 hour hotline) if you are experiencing a Mental  Health  or Behavioral Health Crisis or need someone to talk to.  Channing Larry, RN, BA, [facility], CRRN [facility] Coordinator, Transition of Care Ph # [phone]

## 2023-07-03 NOTE — Telephone Encounter (Signed)
 Dr Laural Benes -  From [facility] call:  She would like to know when she can start back on the ozempic.   Her appointment with you is 120/2025.

## 2023-07-03 NOTE — Telephone Encounter (Signed)
 Patient aware to discontinue Celebrex. Did not express additional questions.

## 2023-07-03 NOTE — Patient Outreach (Signed)
 Care Management  Transitions of Care Program Transitions of Care Post-discharge week 2   07/03/2023 Name: [name] MRN: [id number] DOB: [birth year]  Subjective: [name] is a 73 y.o. year old female who is a primary care patient of Vicci Barnie NOVAK, MD. The Care Management team Engaged with patient by telephone to assess and address transitions of care needs.   Consent to Services:  Patient was given information about care management services, agreed to services, and gave verbal consent to participate.   Assessment:           SDOH Interventions    Flowsheet Row ED to Hosp-Admission (Discharged) from 06/26/2023 in [facility] 5 EAST MEDICAL UNIT Telephone from 06/13/2023 in [place] POPULATION HEALTH DEPARTMENT Clinical Support from 01/24/2023 in [facility] Health Comm Health [place] - A Dept Of [place]. [facility] Telephone from 06/28/2022 in [facility] Community Care Coordination Nutrition from 05/24/2021 in [place] [facility] Nutr Diab Ed  - A Dept Of [place]. [facility]  SDOH Interventions       Food Insecurity Interventions Intervention Not Indicated, Inpatient TOC -- Intervention Not Indicated Intervention Not Indicated Intervention Not Indicated  Housing Interventions Intervention Not Indicated, Inpatient TOC Intervention Not Indicated Intervention Not Indicated -- --  Transportation Interventions Intervention Not Indicated, Inpatient TOC -- Intervention Not Indicated Intervention Not Indicated --  Utilities Interventions -- -- Intervention Not Indicated -- --  Alcohol  Usage Interventions -- -- Intervention Not Indicated (Score <7) -- --  Financial Strain Interventions -- -- Intervention Not Indicated -- --  Physical Activity Interventions -- -- Intervention Not Indicated -- --  Stress Interventions -- -- Intervention Not Indicated -- --  Social Connections Interventions -- -- Intervention Not Indicated -- --  Health  Literacy Interventions -- -- Intervention Not Indicated -- --        Goals Addressed             This Visit's Progress    Transition of Care       Current Barriers:  Knowledge Deficits related to plan of care for management of post operative pain, recuperation needs & return to previous level of physical functioning after undergoing a laparoscopic partial colectomy to remove a colonic mass.  RNCM Clinical Goal(s):  Patient will work with the Care Management team over the next 30 days to address Transition of Care Barriers: Medication Management Diet/Nutrition/Food Resources Support at home Provider appointments Home Health services Equipment/DME Functional/Safety through collaboration with RN Care manager, provider, and care team.   Interventions: Evaluation of current treatment plan related to  self management and patient's adherence to plan as established by provider   Pain Interventions:  (Status:  Ongoing) Short Term Goal Pain assessment performed Medications reviewed Reviewed provider established plan for pain management Discussed importance of adherence to all scheduled medical appointments Advised patient to report to care team affect of pain on daily activities Discussed use of relaxation techniques and/or diversional activities to assist with pain reduction (distraction, imagery, relaxation, massage, acupressure, TENS, heat, and cold application Reviewed with patient prescribed pharmacological and nonpharmacological pain relief strategies Assessed social determinant of health barriers   Surgery (Laparoscopic partial colectomy):  (Status: Ongoing) Short Term Goal Evaluation of current treatment plan related to Colon surgery assessed patient/caregiver understanding of surgical procedure   reviewed post-operative instructions with patient/caregiver addressed questions about post - surgical incision care  reviewed medications with patient and addressed  questions reviewed scheduled provider appointments with patientHas  Post operative follow up with Bernarda Ned on 07/04/23 @ 11:30am 07/03/23 Update - Patient was readmitted to [facility] on 06/26/23-06/29/23 for pneumonia and re-enrolled in TOC30d program on 1/6. Patient has several follow-ups including 07/17/23 with PCP Dr. Barnie Louder, and 2/12 appt with Neurology Duwaine Ahle for nerve conduction study and EMG confirmed availability of transportation to all appointments Patient does not have any transportation challenges getting to appointments  Patient Goals/Self-Care Activities: Participate in Transition of Care Program/Attend TOC scheduled calls Take all medications as prescribed Attend all scheduled provider appointments Call pharmacy for medication refills 3-7 days in advance of running out of medications Perform all self care activities independently  Call provider office for new concerns or questions   Follow Up Plan:  The patient has been provided with contact information for the care management team and has been advised to call with any health related questions or concerns.          Plan: The patient has been provided with contact information for the care management team and has been advised to call with any health related questions or concerns.   Channing Larry, RN, BA, [facility], CRRN [facility] Coordinator, Transition of Care Ph # [phone]

## 2023-07-04 ENCOUNTER — Other Ambulatory Visit: Payer: Self-pay

## 2023-07-04 NOTE — Telephone Encounter (Signed)
 I called the patient and explained Dr Henriette Combs response regarding the ozempic.  The patient said [name] understood and will discuss with Dr Laural Benes at the appointment on 07/17/2023.

## 2023-07-04 NOTE — Telephone Encounter (Addendum)
 Patient has an appointment January 20 at 3:10p.  Please advise on response for Ozempic.is needed before appt.

## 2023-07-05 ENCOUNTER — Other Ambulatory Visit: Payer: Self-pay | Admitting: Pharmacist

## 2023-07-05 DIAGNOSIS — E782 Mixed hyperlipidemia: Secondary | ICD-10-CM

## 2023-07-05 MED ORDER — TRAZODONE HCL 50 MG PO TABS: 50.0000 mg | ORAL_TABLET | Freq: Every evening | ORAL | 1 refills | Status: DC | PRN

## 2023-07-05 MED ORDER — ATORVASTATIN CALCIUM 80 MG PO TABS: ORAL_TABLET | ORAL | 1 refills | Status: DC

## 2023-07-10 ENCOUNTER — Telehealth: Payer: Self-pay

## 2023-07-10 ENCOUNTER — Other Ambulatory Visit: Payer: Self-pay | Admitting: Cardiology

## 2023-07-10 ENCOUNTER — Other Ambulatory Visit: Payer: Self-pay

## 2023-07-10 NOTE — Patient Instructions (Addendum)
 Visit Information  Thank you for taking time to visit with me today. Please don't hesitate to contact me if I can be of assistance to you before our next scheduled telephone appointment.  Our next appointment is by telephone on 07/18/23 at 1pm  Following is a copy of your care plan:   Goals Addressed             This Visit's Progress    Transition of Care       Current Barriers:  Knowledge Deficits related to plan of care for management of post operative pain, recuperation needs & return to previous level of physical functioning after undergoing a laparoscopic partial colectomy to remove a colonic mass.  RNCM Clinical Goal(s):  Patient will work with the Care Management team over the next 30 days to address Transition of Care Barriers: Medication Management Diet/Nutrition/Food Resources Support at home Provider appointments Home Health services Equipment/DME Functional/Safety through collaboration with RN Care manager, provider, and care team.   Interventions: Evaluation of current treatment plan related to  self management and patient's adherence to plan as established by provider   Pain Interventions:  (Status:  Ongoing) Short Term Goal Pain assessment performed Medications reviewed Reviewed provider established plan for pain management Discussed importance of adherence to all scheduled medical appointments Advised patient to report to care team affect of pain on daily activities Discussed use of relaxation techniques and/or diversional activities to assist with pain reduction (distraction, imagery, relaxation, massage, acupressure, TENS, heat, and cold application Reviewed with patient prescribed pharmacological and nonpharmacological pain relief strategies Assessed social determinant of health barriers   Surgery (Laparoscopic partial colectomy):  (Status: Ongoing) Short Term Goal Evaluation of current treatment plan related to Colon surgery assessed patient/caregiver  understanding of surgical procedure   reviewed post-operative instructions with patient/caregiver addressed questions about post - surgical incision care  reviewed medications with patient and addressed questions reviewed scheduled provider appointments with patientHas Post operative follow up with Bernarda Ned on 07/04/23 @ 11:30am 07/03/23 Update - Patient was readmitted to [facility] on 06/26/23-06/29/23 for pneumonia and re-enrolled in TOC30d program on 1/6. Patient has several follow-ups including 07/17/23 with PCP Dr. Barnie Louder, and 2/12 appt with Neurology Duwaine Ahle for nerve conduction study and EMG confirmed availability of transportation to all appointments Patient does not have any transportation challenges getting to appointments  Reamission for Pneumonia 06/26/23. Additional Goal of Optimizing respiratory function and effective breathing (Status Ongoing) Short Term Goal Medications reviewed  Discussed importance of adherence to all scheduled medical appointments Advised patient to report to care team increased difficulty breathing Discussed with the patient the use of Mucinex  (guaifenesin ) and its effectiveness of clearing the mucus in the air passages by thinning it out and making it easier for the patient to cough up Reviewed with patient the continued need to use her Incentive Spirometer to exercise her lungs encouraging use every 1-2 hours while awake, taking slow, deep breaths, increasing lung capacity(reviewed again w/ patient on 07/10/23) Assessed social determinant of health barriers     Patient Goals/Self-Care Activities: Participate in Transition of Care Program/Attend TOC scheduled calls Take all medications as prescribed Attend all scheduled provider appointments Call pharmacy for medication refills 3-7 days in advance of running out of medications Perform all self care activities independently  Call provider office for new concerns or questions   Follow Up  Plan:  The patient has been provided with contact information for the care management team and has been advised to  call with any health related questions or concerns.          Patient verbalizes understanding of instructions and care plan provided today and agrees to view in MyChart. Active MyChart status and patient understanding of how to access instructions and care plan via MyChart confirmed with patient.     The patient has been provided with contact information for the care management team and has been advised to call with any health related questions or concerns.   Please call the care guide team at [phone] if you need to cancel or reschedule your appointment.   Please call 1-800-273-TALK (toll free, 24 hour hotline) if you are experiencing a Mental Health or Behavioral Health Crisis or need someone to talk to.  Channing Larry, RN, BA, [facility], CRRN [facility] Coordinator, Transition of Care Ph # [phone]

## 2023-07-10 NOTE — Patient Outreach (Signed)
 Care Management  Transitions of Care Program Transitions of Care Post-discharge week 2   07/10/2023 Name: [name] MRN: [id number] DOB: 21-Aug-1950  Subjective: [name] is a 73 y.o. year old female who is a primary care patient of Vicci Barnie NOVAK, MD. The Care Management team Engaged with patient Engaged with patient by telephone to assess and address transitions of care needs.   Consent to Services:  Patient was given information about care management services, agreed to services, and gave verbal consent to participate.   Assessment:           SDOH Interventions    Flowsheet Row ED to Hosp-Admission (Discharged) from 06/26/2023 in [facility] 5 EAST MEDICAL UNIT Telephone from 06/13/2023 in [place] POPULATION HEALTH DEPARTMENT Clinical Support from 01/24/2023 in [facility] Health Comm Health [place] - A Dept Of [place]. [facility] Telephone from 06/28/2022 in [facility] Community Care Coordination Nutrition from 05/24/2021 in [place] [facility] Nutr Diab Ed  - A Dept Of [place]. [facility]  SDOH Interventions       Food Insecurity Interventions Intervention Not Indicated, Inpatient TOC -- Intervention Not Indicated Intervention Not Indicated Intervention Not Indicated  Housing Interventions Intervention Not Indicated, Inpatient TOC Intervention Not Indicated Intervention Not Indicated -- --  Transportation Interventions Intervention Not Indicated, Inpatient TOC -- Intervention Not Indicated Intervention Not Indicated --  Utilities Interventions -- -- Intervention Not Indicated -- --  Alcohol  Usage Interventions -- -- Intervention Not Indicated (Score <7) -- --  Financial Strain Interventions -- -- Intervention Not Indicated -- --  Physical Activity Interventions -- -- Intervention Not Indicated -- --  Stress Interventions -- -- Intervention Not Indicated -- --  Social Connections Interventions -- -- Intervention Not  Indicated -- --  Health Literacy Interventions -- -- Intervention Not Indicated -- --        Goals Addressed             This Visit's Progress    Transition of Care       Current Barriers:  Knowledge Deficits related to plan of care for management of post operative pain, recuperation needs & return to previous level of physical functioning after undergoing a laparoscopic partial colectomy to remove a colonic mass.  RNCM Clinical Goal(s):  Patient will work with the Care Management team over the next 30 days to address Transition of Care Barriers: Medication Management Diet/Nutrition/Food Resources Support at home Provider appointments Home Health services Equipment/DME Functional/Safety through collaboration with RN Care manager, provider, and care team.   Interventions: Evaluation of current treatment plan related to  self management and patient's adherence to plan as established by provider   Pain Interventions:  (Status:  Ongoing) Short Term Goal Pain assessment performed Medications reviewed Reviewed provider established plan for pain management Discussed importance of adherence to all scheduled medical appointments Advised patient to report to care team affect of pain on daily activities Discussed use of relaxation techniques and/or diversional activities to assist with pain reduction (distraction, imagery, relaxation, massage, acupressure, TENS, heat, and cold application Reviewed with patient prescribed pharmacological and nonpharmacological pain relief strategies Assessed social determinant of health barriers   Surgery (Laparoscopic partial colectomy):  (Status: Ongoing) Short Term Goal Evaluation of current treatment plan related to Colon surgery assessed patient/caregiver understanding of surgical procedure   reviewed post-operative instructions with patient/caregiver addressed questions about post - surgical incision care  reviewed medications with patient and  addressed questions reviewed scheduled provider  appointments with patientHas Post operative follow up with Bernarda Ned on 07/04/23 @ 11:30am 07/03/23 Update - Patient was readmitted to [facility] on 06/26/23-06/29/23 for pneumonia and re-enrolled in TOC30d program on 1/6. Patient has several follow-ups including 07/17/23 with PCP Dr. Barnie Louder, and 2/12 appt with Neurology Duwaine Ahle for nerve conduction study and EMG confirmed availability of transportation to all appointments Patient does not have any transportation challenges getting to appointments  Reamission for Pneumonia 06/26/23. Additional Goal of Optimizing respiratory function and effective breathing (Status Ongoing) Short Term Goal Medications reviewed  Discussed importance of adherence to all scheduled medical appointments Advised patient to report to care team increased difficulty breathing Discussed with the patient the use of Mucinex  (guaifenesin ) and its effectiveness of clearing the mucus in the air passages by thinning it out and making it easier for the patient to cough up Reviewed with patient the continued need to use her Incentive Spirometer to exercise her lungs encouraging use every 1-2 hours while awake, taking slow, deep breaths, increasing lung capacity(reviewed again w/ patient on 07/10/23) Assessed social determinant of health barriers     Patient Goals/Self-Care Activities: Participate in Transition of Care Program/Attend TOC scheduled calls Take all medications as prescribed Attend all scheduled provider appointments Call pharmacy for medication refills 3-7 days in advance of running out of medications Perform all self care activities independently  Call provider office for new concerns or questions   Follow Up Plan:  The patient has been provided with contact information for the care management team and has been advised to call with any health related questions or concerns.          Plan: The  patient has been provided with contact information for the care management team and has been advised to call with any health related questions or concerns.   Channing Larry, RN, BA, [facility], CRRN [facility] Coordinator, Transition of Care Ph # [phone]

## 2023-07-17 ENCOUNTER — Ambulatory Visit: Payer: Medicare HMO | Attending: Internal Medicine | Admitting: Internal Medicine

## 2023-07-17 ENCOUNTER — Encounter: Payer: Self-pay | Admitting: Internal Medicine

## 2023-07-17 DIAGNOSIS — I152 Hypertension secondary to endocrine disorders: Secondary | ICD-10-CM

## 2023-07-17 DIAGNOSIS — I25810 Atherosclerosis of coronary artery bypass graft(s) without angina pectoris: Secondary | ICD-10-CM

## 2023-07-17 DIAGNOSIS — N1832 Chronic kidney disease, stage 3b: Secondary | ICD-10-CM | POA: Diagnosis not present

## 2023-07-17 DIAGNOSIS — E1159 Type 2 diabetes mellitus with other circulatory complications: Secondary | ICD-10-CM

## 2023-07-17 DIAGNOSIS — E66813 Obesity, class 3: Secondary | ICD-10-CM | POA: Diagnosis not present

## 2023-07-17 DIAGNOSIS — D649 Anemia, unspecified: Secondary | ICD-10-CM

## 2023-07-17 DIAGNOSIS — E1169 Type 2 diabetes mellitus with other specified complication: Secondary | ICD-10-CM | POA: Diagnosis not present

## 2023-07-17 DIAGNOSIS — Z6841 Body Mass Index (BMI) 40.0 and over, adult: Secondary | ICD-10-CM | POA: Diagnosis not present

## 2023-07-17 DIAGNOSIS — Z794 Long term (current) use of insulin: Secondary | ICD-10-CM

## 2023-07-17 DIAGNOSIS — Z7984 Long term (current) use of oral hypoglycemic drugs: Secondary | ICD-10-CM

## 2023-07-17 DIAGNOSIS — E119 Type 2 diabetes mellitus without complications: Secondary | ICD-10-CM

## 2023-07-17 MED ORDER — SEMAGLUTIDE(0.25 OR 0.5MG/DOS) 2 MG/3ML ~~LOC~~ SOPN: 0.2500 mg | PEN_INJECTOR | SUBCUTANEOUS | 1 refills | Status: DC

## 2023-07-17 NOTE — Progress Notes (Signed)
Patient ID: [name], female    DOB: 03/03/1951  MRN: [id number]  CC: Diabetes (DM f/u. Carlyn Reichert, slight wheezing but reports feeling better - discuss pneumonia vax/Already received flu vax)   Subjective: [name] is a 73 y.o. female who presents for chronic ds management. Her concerns today include:  Pt with hx of HTN, DM, HL, depression, chronic back pain, RT breast CA (intraductal CA, ER/PR+, lumpectomy and XRT - completed 08/2017), CKD stage 3, CVA 2011 (residual weakness on LT side), CAD s/p CABG and MVR/resection of fibroelastoma, IDA.    Discussed the use of AI scribe software for clinical note transcription with the patient, who gave verbal consent to proceed.  History of Present Illness   She was hospitalized at the end of last month and discharged on the second of January due to BL pneumonia. She was sent home with antibiotics and Mucinex, but reports that she ran out of medication while still experiencing congestion. She reports feeling significantly better now, with only a small amount of rumbling in her chest and occasional coughing up of white phlegm.  Since last visit, she had right colectomy on 06/08/2023 for colonic mass.  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  It was negative for invasive carcinoma.  24 lymph nodes were sampled and were also negative for cancer.  She is due to follow up with her surgeon in a few days.  She is moving her bowels well.  DM: recent A1c of 8.5. She is currently on Lantus 10 units daily and Farxiga 5 mg.  She would like to get back on Ozempic that she was on last year but had to stop when it became cost prohibitive.  She states that her insurance will now cover it. She checks her blood sugar daily, with readings ranging from 98 to 108 before breakfast. She feels she is doing okay with her eating habits.  Weight is up 8 pounds since I last saw her in September. -She is due for  an eye exam and is seeking a provider in [place], [place] lives.      HTN/CAD: Should be on carvedilol 25 mg twice a day, hydralazine 100 mg in the mornings and evenings and 50 mg at noon, amlodipine 10 mg daily and valsartan 160 mg daily.  She has not taken her second dose of hydralazine or dose of amlodipine and valsartan as yet for today.  She plans to take when she returns home.  Checks her blood pressure once a day.  No CP/SOB/LE edema  She has been managing her chronic anemia with daily iron supplements, and her cholesterol with atorvastatin.   Hb usually between 10-11.  However most recent on hospital discharge was 8.3/27.2  CKD 3B: She states that she has an upcoming appointment with nephrology.  GFR has ranged between 34-44 with most recent being 35.     Patient Active Problem List   Diagnosis Date Noted   Pneumonia 06/26/2023   Colonic mass 06/08/2023   Chronic right shoulder pain 03/28/2023   Neck pain 03/28/2023   Primary osteoarthritis of right shoulder 08/10/2022   CAP (community acquired pneumonia) 06/22/2022   Stage 3b chronic kidney disease (CKD) (HCC) 06/22/2022   Trigger finger, left index finger 12/02/2021   DM type 2 with diabetic peripheral neuropathy (HCC) 07/26/2021   Bilateral low back pain with bilateral sciatica 07/26/2021   Gait abnormality 07/26/2021   Controlled substance  agreement signed 12/31/2020   Shoulder stiffness, left 07/07/2020   Status post lumbar spine surgery for decompression of spinal cord 06/09/2020   Lumbar stenosis 05/27/2020   Spinal stenosis of lumbar region 05/21/2019   Complete tear of left rotator cuff 05/16/2019   Uncontrolled type 2 diabetes mellitus with hyperglycemia (HCC) 12/19/2018   Microalbuminuria 04/08/2018   Genetic testing 09/25/2017   Family history of breast cancer    Family history of prostate cancer    Malignant neoplasm of upper-outer quadrant of right breast in female, estrogen receptor positive (HCC)  06/26/2017   Lumbar herniated disc 04/15/2015   Abnormal nuclear stress test 04/03/2015   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   CAD (coronary artery disease), native coronary artery 03/30/2015   S/P mitral valve repair 11/16/2010   Pure hypercholesterolemia 06/29/2010   Anemia of renal disease 09/29/2006   Essential hypertension 09/29/2006     Current Outpatient Medications on File Prior to Visit  Medication Sig Dispense Refill   amLODipine (NORVASC) 10 MG tablet TAKE 1 TABLET EVERY DAY (Patient taking differently: Take 10 mg by mouth daily with lunch.) 90 tablet 2   aspirin EC 81 MG tablet Take 1 tablet (81 mg total) by mouth daily. 30 tablet 5   atorvastatin (LIPITOR) 80 MG tablet TAKE 1 TABLET EVERY DAY. 90 tablet 1   buPROPion (WELLBUTRIN XL) 150 MG 24 hr tablet TAKE 1 TABLET TWICE DAILY (Patient taking differently: Take 150 mg by mouth in the morning and at bedtime.) 180 tablet 1   carvedilol (COREG) 25 MG tablet TAKE 1 TABLET TWICE DAILY 180 tablet 0   diclofenac Sodium (VOLTAREN) 1 % GEL APPLY 2 GRAMS TOPICALLY 4 (FOUR) TIMES DAILY AS NEEDED. (Patient taking differently: Apply 2 g topically 4 (four) times daily as needed (for pain).) 100 g 0   docusate sodium (COLACE) 100 MG capsule Take 100 mg by mouth daily as needed for mild constipation.     DULoxetine (CYMBALTA) 30 MG capsule Take 1 capsule (30 mg total) by mouth daily. (Patient taking differently: Take 30 mg by mouth daily with lunch.) 180 capsule 1   FARXIGA 10 MG TABS tablet Take 10 mg by mouth daily with lunch.     FEROSUL 325 (65 Fe) MG tablet Take 325 mg by mouth daily with breakfast.     furosemide (LASIX) 20 MG tablet Take 20 mg by mouth in the morning.     gabapentin (NEURONTIN) 300 MG capsule Take 1 capsule (300 mg total) by mouth 3 (three) times daily. (Patient taking differently: Take 300 mg by mouth 3 (three) times daily as needed (for pain).) 90 capsule 5   hydrALAZINE (APRESOLINE) 100 MG tablet TAKE 1  TABLET TWICE DAILY (Patient taking differently: Take 50-100 mg by mouth See admin instructions. Take 100 mg by mouth in the morning & at bedtime and 50 mg with lunch) 180 tablet 2   insulin glargine (LANTUS) 100 UNIT/ML Solostar Pen Inject 10 Units into the skin at bedtime. 15 mL 3   Insulin Pen Needle (PEN NEEDLES) 31G X 8 MM MISC UAD 100 each 6   letrozole (FEMARA) 2.5 MG tablet TAKE 1 TABLET EVERY DAY (Patient taking differently: Take 2.5 mg by mouth daily with lunch.) 90 tablet 3   methocarbamol (ROBAXIN) 500 MG tablet TAKE 1 TABLET EVERY 8 HOURS AS NEEDED FOR MUSCLE SPASMS. (Patient taking differently: Take 500 mg by mouth every 8 (eight) hours as needed for muscle spasms.) 270 tablet 0   Multiple Vitamin (  MULTIVITAMIN WITH MINERALS) TABS tablet Take 1 tablet by mouth daily with breakfast.     pantoprazole (PROTONIX) 40 MG tablet Take 1 tablet (40 mg total) by mouth daily. (Patient taking differently: Take 40 mg by mouth daily before breakfast.) 90 tablet 0   polyethylene glycol powder (GLYCOLAX/MIRALAX) 17 GM/SCOOP powder Take 17 g by mouth daily as needed for mild constipation.     traMADol (ULTRAM) 50 MG tablet Take 1 tablet (50 mg total) by mouth every 12 (twelve) hours as needed. (Patient taking differently: Take 50 mg by mouth every 12 (twelve) hours as needed (for pain).) 40 tablet 0   traZODone (DESYREL) 50 MG tablet Take 1 tablet (50 mg total) by mouth at bedtime as needed. for sleep 90 tablet 1   valsartan (DIOVAN) 160 MG tablet Take 1 tablet (160 mg total) by mouth daily. 90 tablet 1   albuterol (VENTOLIN HFA) 108 (90 Base) MCG/ACT inhaler Inhale 2 puffs into the lungs every 6 (six) hours as needed for wheezing or shortness of breath. 1 each 0   celecoxib (CELEBREX) 50 MG capsule Take 1 capsule (50 mg total) by mouth 2 (two) times daily as needed for pain. (Patient not taking: Reported on 07/17/2023) 60 capsule 3   No current facility-administered medications on file prior to visit.     Not on File  Social History   Socioeconomic History   Marital status: Married    Spouse name: Not on file   Number of children: 3   Years of education: some college   Highest education level: Not on file  Occupational History    Employer: UNEMPLOYED  Tobacco Use   Smoking status: Former    Current packs/day: 0.00    Types: Cigarettes    Quit date: 10/14/2010    Years since quitting: 12.7    Passive exposure: Never   Smokeless tobacco: Never  Vaping Use   Vaping status: Never Used  Substance and Sexual Activity   Alcohol use: Yes    Comment: social   Drug use: Not Currently    Types: Marijuana   Sexual activity: Not Currently  Other Topics Concern   Not on file  Social History Narrative   Lives with husband, stay at home, uses cane occasionally, still active/ambulatory.    Left-handed.   No daily caffeine use.   Social Drivers of Corporate investment banker Strain: Low Risk  (07/17/2023)   Overall Financial Resource Strain (CARDIA)    Difficulty of Paying Living Expenses: Not hard at all  Food Insecurity: No Food Insecurity (07/17/2023)   Hunger Vital Sign    Worried About Running Out of Food in the Last Year: Never true    Ran Out of Food in the Last Year: Never true  Transportation Needs: No Transportation Needs (07/17/2023)   PRAPARE - Administrator, Civil Service (Medical): No    Lack of Transportation (Non-Medical): No  Physical Activity: Inactive (07/17/2023)   Exercise Vital Sign    Days of Exercise per Week: 0 days    Minutes of Exercise per Session: 0 min  Stress: No Stress Concern Present (07/17/2023)   [employer] of Occupational Health - Occupational Stress Questionnaire    Feeling of Stress : Not at all  Social Connections: Socially Integrated (07/17/2023)   Social Connection and Isolation Panel [NHANES]    Frequency of Communication with Friends and Family: More than three times a week    Frequency of Social Gatherings with  Friends  and Family: Three times a week    Attends Religious Services: More than 4 times per year    Active Member of Clubs or Organizations: Yes    Attends Banker Meetings: More than 4 times per year    Marital Status: Married  Catering manager Violence: Not At Risk (07/17/2023)   Humiliation, Afraid, Rape, and Kick questionnaire    Fear of Current or Ex-Partner: No    Emotionally Abused: No    Physically Abused: No    Sexually Abused: No    Family History  Problem Relation Age of Onset   Colon cancer Mother        dx 2s; deceased 68   Irritable bowel syndrome Mother    Prostate cancer Father        deceased 31s   Cirrhosis Sister        died of GI bleed associated with cirrhosis of the liver   Stroke Brother    Prostate cancer Brother 7       deceased 78   Diabetes Brother        x3   Kidney disease Brother    Cancer Brother 94       unk. type   Breast cancer Cousin        several maternal 1st and 2nd cousins with breast cancer   Rectal cancer Neg Hx    Stomach cancer Neg Hx    Esophageal cancer Neg Hx     Past Surgical History:  Procedure Laterality Date   BREAST BIOPSY Right 08/26/2019   benign   BREAST LUMPECTOMY Right    BREAST LUMPECTOMY WITH RADIOACTIVE SEED AND SENTINEL LYMPH NODE BIOPSY Right 07/20/2017   Procedure: RIGHT BREAST LUMPECTOMY WITH RADIOACTIVE SEED AND SENTINEL LYMPH NODE BIOPSY;  Surgeon: Ovidio Kin, MD;  Location: [facility] OR;  Service: General;  Laterality: Right;   CARDIAC CATHETERIZATION  04/03/2015   Procedure: Left Heart Cath and Cors/Grafts Angiography;  Surgeon: Peter M Swaziland, MD;  Location: [facility] INVASIVE CV LAB;  Service: Cardiovascular;;   CORONARY ARTERY BYPASS GRAFT  1/12   history of ankle fractures requiring surgery     LUMBAR LAMINECTOMY/DECOMPRESSION MICRODISCECTOMY Left 04/15/2015   Procedure: LUMBAR LAMINECTOMY/DECOMPRESSION MICRODISCECTOMY 1 LEVEL;  Surgeon: Tressie Stalker, MD;  Location: [facility]URO ORS;  Service:  Neurosurgery;  Laterality: Left;  Left L23 microdiskectomy   LUMBAR LAMINECTOMY/DECOMPRESSION MICRODISCECTOMY N/A 05/27/2020   Procedure: L2-3, L3-4  decompression;  Surgeon: Eldred Manges, MD;  Location: [facility] OR;  Service: Orthopedics;  Laterality: N/A;   MV repair and resection of mass  1/12   RESECTION DISTAL CLAVICAL Left 05/16/2019   Procedure: RESECTION DISTAL CLAVICAL;  Surgeon: Kathryne Hitch, MD;  Location: [facility];  Service: Orthopedics;  Laterality: Left;   SHOULDER ARTHROSCOPY WITH ROTATOR CUFF REPAIR AND SUBACROMIAL DECOMPRESSION Left 05/16/2019   Procedure: SHOULDER ARTHROSCOPY WITH ROTATOR CUFF REPAIR AND SUBACROMIAL DECOMPRESSION;  Surgeon: Kathryne Hitch, MD;  Location: [facility];  Service: Orthopedics;  Laterality: Left;   TONSILLECTOMY  1971   TOTAL ABDOMINAL HYSTERECTOMY  1978   TRIGGER FINGER RELEASE Right 12/02/2021   Procedure: RIGHT RING FINGER A-1 PULLEY RELEASE;  Surgeon: Kathryne Hitch, MD;  Location: [facility];  Service: Orthopedics;  Laterality: Right;   TRIGGER FINGER RELEASE Left 05/11/2023   Procedure: LEFT INDEX FINGER RELEASE TRIGGER FINGER;  Surgeon: Kathryne Hitch, MD;  Location: [facility];  Service: Orthopedics;  Laterality: Left;  VESICOVAGINAL FISTULA CLOSURE W/ TAH      ROS: Review of Systems Negative except as stated above  PHYSICAL EXAM: BP (!) 148/71 (BP Location: Left Arm, Patient Position: Sitting, Cuff Size: Large)   Pulse 72   Temp (!) 97.5 F (36.4 C) (Oral)   Ht 5\' 8"  (1.727 m)   Wt 264 lb (119.7 kg)   SpO2 99%   BMI 40.14 kg/m   Wt Readings from Last 3 Encounters:  07/17/23 264 lb (119.7 kg)  06/29/23 260 lb 2.3 oz (118 kg)  06/11/23 272 lb 7.8 oz (123.6 kg)    Physical Exam  General appearance - alert, well appearing, older African-American female and in no distress Mental status - normal mood, behavior, speech, dress, motor  activity, and thought processes Neck - supple, no significant adenopathy Chest - clear to auscultation, no wheezes, rales or rhonchi, symmetric air entry Heart - normal rate, regular rhythm, normal S1, S2, no murmurs, rubs, clicks or gallops Extremities - peripheral pulses normal, no pedal edema, no clubbing or cyanosis      Latest Ref Rng & Units 06/29/2023    5:37 AM 06/28/2023    5:37 AM 06/27/2023    6:23 AM  CMP  Glucose 70 - 99 mg/dL 80  161  096   BUN 8 - 23 mg/dL 26  33  25   Creatinine 0.44 - 1.00 mg/dL 0.45  4.09  8.11   Sodium 135 - 145 mmol/L 133  137  138   Potassium 3.5 - 5.1 mmol/L 4.3  4.4  5.0   Chloride 98 - 111 mmol/L 109  111  107   CO2 22 - 32 mmol/L 19  18  21    Calcium 8.9 - 10.3 mg/dL 8.0  8.1  8.9    Lipid Panel     Component Value Date/Time   CHOL 204 (H) 11/07/2022 1606   TRIG 114 11/07/2022 1606   HDL 98 11/07/2022 1606   CHOLHDL 2.1 11/07/2022 1606   CHOLHDL 2.8 04/28/2016 1028   VLDL 19 04/28/2016 1028   LDLCALC 87 11/07/2022 1606   LDLDIRECT 101.7 07/18/2011 0901    CBC    Component Value Date/Time   WBC 4.7 06/29/2023 0537   RBC 3.38 (L) 06/29/2023 0537   HGB 8.3 (L) 06/29/2023 0537   HGB 10.0 (L) 11/07/2022 1606   HCT 27.2 (L) 06/29/2023 0537   HCT 32.2 (L) 11/07/2022 1606   PLT 263 06/29/2023 0537   PLT 259 11/07/2022 1606   MCV 80.5 06/29/2023 0537   MCV 80 11/07/2022 1606   MCH 24.6 (L) 06/29/2023 0537   MCHC 30.5 06/29/2023 0537   RDW 16.4 (H) 06/29/2023 0537   RDW 15.3 11/07/2022 1606   LYMPHSABS 1.0 06/26/2023 1517   MONOABS 0.5 06/26/2023 1517   EOSABS 0.0 06/26/2023 1517   BASOSABS 0.1 06/26/2023 1517    ASSESSMENT AND PLAN:  1. Type 2 diabetes mellitus with morbid obesity (HCC) (Primary) Not at goal.  Patient will continue Lantus insulin 10 units daily, Farxiga 10 mg daily.  Restart Ozempic.  I went over with her possible side effects of the medication including nausea, vomiting, abdominal pain, severe  diarrhea/constipation.  If she develops any vomiting, abdominal pain, severe diarrhea/constipation or abdominal pain with vomiting or vomiting, abdominal pain and not able to pass her bowels, she should be seen immediately.  We will start her on the lowest dose which is 0.25 mg once a week.  After being on this for  a month, if she is tolerating it okay, we can increase the dose to the 0.5 mg. - Ambulatory referral to Ophthalmology - Semaglutide,0.25 or 0.5MG /DOS, 2 MG/3ML SOPN; Inject 0.25 mg into the skin once a week.  Dispense: 3 mL; Refill: 1  2. Insulin long-term use (HCC) 3. Diabetes mellitus treated with oral medication (HCC) See #1 above.  4. Hypertension associated with diabetes (HCC) Not at goal.  She has not taken 2 of her blood pressure medications and the second dose of hydralazine as yet for today.  Advised to take those medicines as soon as she returns home.  Continue to monitor blood pressure daily with goal being 130/80 or lower. - CBC  5. Stage 3b chronic kidney disease (HCC) Continue to avoid NSAIDs.  Keep appointment with nephrology.  6. CAD of autologous artery bypass graft without angina Stable.  Continue carvedilol, atorvastatin and aspirin.  7. Chronic anemia - CBC - Iron, TIBC and Ferritin Panel     There are no diagnoses linked to this encounter.   Patient was given the opportunity to ask questions.  Patient verbalized understanding of the plan and was able to repeat key elements of the plan.   This documentation was completed using Paediatric nurse.  Any transcriptional errors are unintentional.  Orders Placed This Encounter  Procedures   CBC   Iron, TIBC and Ferritin Panel   Ambulatory referral to Ophthalmology     Requested Prescriptions   Signed Prescriptions Disp Refills   Semaglutide,0.25 or 0.5MG /DOS, 2 MG/3ML SOPN 3 mL 1    Sig: Inject 0.25 mg into the skin once a week.    Return in about 4 months (around 11/14/2023)  for Appt with [facility] in 4 wks for BP check.  Jonah Blue, MD, FACP

## 2023-07-17 NOTE — Patient Instructions (Signed)
Start Ozempic 0.25 mg once a week.  Please let us know if you develop any vomiting, abdominal pain, severe diarrhea or constipation. Blood pressure is elevated.  Please take your medicines when you return home.  Continue to monitor blood pressure daily.  Our goal for your blood pressure is 130/80 or lower.

## 2023-07-18 ENCOUNTER — Telehealth: Payer: Self-pay

## 2023-07-18 ENCOUNTER — Other Ambulatory Visit: Payer: Self-pay

## 2023-07-18 LAB — IRON,TIBC AND FERRITIN PANEL
Ferritin: 72 ng/mL (ref 15–150)
Iron Saturation: 27 % (ref 15–55)
Iron: 89 ug/dL (ref 27–139)
Total Iron Binding Capacity: 327 ug/dL (ref 250–450)
UIBC: 238 ug/dL (ref 118–369)

## 2023-07-18 LAB — CBC
Hematocrit: 33 % — ABNORMAL LOW (ref 34.0–46.6)
Hemoglobin: 9.8 g/dL — ABNORMAL LOW (ref 11.1–15.9)
MCH: 23.7 pg — ABNORMAL LOW (ref 26.6–33.0)
MCHC: 29.7 g/dL — ABNORMAL LOW (ref 31.5–35.7)
MCV: 80 fL (ref 79–97)
Platelets: 253 10*3/uL (ref 150–450)
RBC: 4.13 x10E6/uL (ref 3.77–5.28)
RDW: 15.2 % (ref 11.7–15.4)
WBC: 7.6 10*3/uL (ref 3.4–10.8)

## 2023-07-18 NOTE — Progress Notes (Signed)
Still anemic but has improved since last checked prior to hospital discharge.  Iron levels have also improved.  Continue taking iron supplement daily for the next 2 to 3 weeks and then after that take it at least 4 to 5 days a week.

## 2023-07-18 NOTE — Patient Instructions (Signed)
Visit Information  Dear Gigi Gin (and Everardo All),  Thank you for taking time to visit with me today. Please don't hesitate to contact me if I can be of assistance to you before our next scheduled telephone appointment.  Our next appointment is by telephone on 1/28 at 1pm.  Following is a copy of your care plan:   Goals Addressed             This Visit's Progress    Transition of Care       Current Barriers:  Knowledge Deficits related to plan of care for management of post operative pain, recuperation needs & return to previous level of physical functioning after undergoing a laparoscopic partial colectomy to remove a colonic mass.  RNCM Clinical Goal(s):  Patient will work with the Care Management team over the next 30 days to address Transition of Care Barriers: Medication Management Diet/Nutrition/Food Resources Support at home Provider appointments Home Health services Equipment/DME Functional/Safety through collaboration with RN Care manager, provider, and care team.   Interventions: Evaluation of current treatment plan related to  self management and patient's adherence to plan as established by provider   Pain Interventions:  (Status:  Ongoing) Short Term Goal Pain assessment performed Medications reviewed Reviewed provider established plan for pain management Discussed importance of adherence to all scheduled medical appointments Advised patient to report to care team affect of pain on daily activities Discussed use of relaxation techniques and/or diversional activities to assist with pain reduction (distraction, imagery, relaxation, massage, acupressure, TENS, heat, and cold application Reviewed with patient prescribed pharmacological and nonpharmacological pain relief strategies Assessed social determinant of health barriers   Surgery (Laparoscopic partial colectomy):  (Status: Ongoing) Short Term Goal Evaluation of current treatment plan related to Colon  surgery assessed patient/caregiver understanding of surgical procedure   reviewed post-operative instructions with patient/caregiver addressed questions about post - surgical incision care  reviewed medications with patient and addressed questions reviewed scheduled provider appointments with patientHas Post operative follow up with Romie Levee on 07/04/23 @ 11:30am 07/03/23 Update - Patient was readmitted to [facility] on 06/26/23-06/29/23 for pneumonia and re-enrolled in TOC30d program on 1/6. Patient completed post-hospitalization follow up with PCP Dr. Jonah Blue on 1/20 and stated "everything looked good" except she remains anemic and does need to continue daily iron intake and have follow up lab in 2 weeks to check hemaglobin. She also has a 2/12 appt with Neurology Christophe Louis for Nerve conduction study & EMG due to her complaint of increased involuntary muscle twitches. confirmed availability of transportation to all appointments Patient does not have any transportation challenges getting to appointments  Reamission for Pneumonia 06/26/23. Additional Goal of Optimizing respiratory function and effective breathing (Status Ongoing) Short Term Goal Medications reviewed  Discussed importance of adherence to all scheduled medical appointments Advised patient to report to care team increased difficulty breathing Discussed with the patient the use of Mucinex (guaifenesin) and its effectiveness of clearing the mucus in the air passages by thinning it out and making it easier for the patient to cough up Reviewed with patient the continued need to use her Incentive Spirometer to exercise her lungs encouraging use every 1-2 hours while awake, taking slow, deep breaths, increasing lung capacity(reviewed again w/ patient on 07/10/23) Assessed social determinant of health barriers     Patient Goals/Self-Care Activities: Participate in Transition of Care Program/Attend TOC scheduled calls Take  all medications as prescribed Attend all scheduled provider appointments Call pharmacy for medication refills 3-7 days  in advance of running out of medications Perform all self care activities independently  Call provider office for new concerns or questions   Follow Up Plan:  The patient has been provided with contact information for the care management team and has been advised to call with any health related questions or concerns.          Patient verbalizes understanding of instructions and care plan provided today and agrees to view in MyChart. Active MyChart status and patient understanding of how to access instructions and care plan via MyChart confirmed with patient.     The patient has been provided with contact information for the care management team and has been advised to call with any health related questions or concerns.   Please call the care guide team at [phone] if you need to cancel or reschedule your appointment.   Please call [phone]-TALK (toll free, 24 hour hotline) if you are experiencing a Mental Health or Behavioral Health Crisis or need someone to talk to.  Alyse Low, RN, BA, [facility], CRRN [facility] Coordinator, Transition of Care Ph # [phone]

## 2023-07-18 NOTE — Patient Outreach (Signed)
Care Management  Transitions of Care Program Transitions of Care Post-discharge week 3   07/18/2023 Name: [name] MRN: [id number] DOB: [birth year]  Subjective: [name] is a 73 y.o. year old female who is a primary care patient of Marcine Matar, MD. The Care Management team Engaged with patient Engaged with patient by telephone to assess and address transitions of care needs.   Consent to Services:  Patient was given information about care management services, agreed to services, and gave verbal consent to participate.   Assessment:           SDOH Interventions    Flowsheet Row Office Visit from 07/17/2023 in [facility] Health Comm Health [place] - A Dept Of [place]. [facility] ED to Hosp-Admission (Discharged) from 06/26/2023 in [facility] 5 EAST MEDICAL UNIT Telephone from 06/13/2023 in [place] POPULATION HEALTH DEPARTMENT Clinical Support from 01/24/2023 in [facility] Health Comm Health [place] - A Dept Of [place]. [facility] Telephone from 06/28/2022 in [facility] Community Care Coordination Nutrition from 05/24/2021 in [place] [facility] Nutr Diab Ed  - A Dept Of [place]. [facility]  SDOH Interventions        Food Insecurity Interventions Intervention Not Indicated Intervention Not Indicated, Inpatient TOC -- Intervention Not Indicated Intervention Not Indicated Intervention Not Indicated  Housing Interventions Intervention Not Indicated Intervention Not Indicated, Inpatient TOC Intervention Not Indicated Intervention Not Indicated -- --  Transportation Interventions Intervention Not Indicated Intervention Not Indicated, Inpatient TOC -- Intervention Not Indicated Intervention Not Indicated --  Utilities Interventions Intervention Not Indicated -- -- Intervention Not Indicated -- --  Alcohol Usage Interventions Intervention Not Indicated (Score <7) -- -- Intervention Not Indicated (Score <7) -- --  Financial  Strain Interventions Intervention Not Indicated -- -- Intervention Not Indicated -- --  Physical Activity Interventions [employer] -- -- Intervention Not Indicated -- --  Stress Interventions Intervention Not Indicated -- -- Intervention Not Indicated -- --  Social Connections Interventions Intervention Not Indicated -- -- Intervention Not Indicated -- --  Health Literacy Interventions Intervention Not Indicated -- -- Intervention Not Indicated -- --        Goals Addressed             This Visit's Progress    Transition of Care       Current Barriers:  Knowledge Deficits related to plan of care for management of post operative pain, recuperation needs & return to previous level of physical functioning after undergoing a laparoscopic partial colectomy to remove a colonic mass.  RNCM Clinical Goal(s):  Patient will work with the Care Management team over the next 30 days to address Transition of Care Barriers: Medication Management Diet/Nutrition/Food Resources Support at home Provider appointments Home Health services Equipment/DME Functional/Safety through collaboration with RN Care manager, provider, and care team.   Interventions: Evaluation of current treatment plan related to  self management and patient's adherence to plan as established by provider   Pain Interventions:  (Status:  Ongoing) Short Term Goal Pain assessment performed Medications reviewed Reviewed provider established plan for pain management Discussed importance of adherence to all scheduled medical appointments Advised patient to report to care team affect of pain on daily activities Discussed use of relaxation techniques and/or diversional activities to assist with pain reduction (distraction, imagery, relaxation, massage, acupressure, TENS, heat, and cold application Reviewed with patient prescribed pharmacological and nonpharmacological pain relief strategies Assessed social determinant of health  barriers  Surgery (Laparoscopic partial colectomy):  (Status: Ongoing) Short Term Goal Evaluation of current treatment plan related to Colon surgery assessed patient/caregiver understanding of surgical procedure   reviewed post-operative instructions with patient/caregiver addressed questions about post - surgical incision care  reviewed medications with patient and addressed questions reviewed scheduled provider appointments with patientHas Post operative follow up with Romie Levee on 07/04/23 @ 11:30am 07/03/23 Update - Patient was readmitted to [facility] on 06/26/23-06/29/23 for pneumonia and re-enrolled in TOC30d program on 1/6. Patient completed post-hospitalization follow up with PCP Dr. Jonah Blue on 1/20 and stated "everything looked good" except she remains anemic and does need to continue daily iron intake and have follow up lab in 2 weeks to check hemaglobin. She also has a 2/12 appt with Neurology Christophe Louis for Nerve conduction study & EMG due to her complaint of increased involuntary muscle twitches. confirmed availability of transportation to all appointments Patient does not have any transportation challenges getting to appointments  Reamission for Pneumonia 06/26/23. Additional Goal of Optimizing respiratory function and effective breathing (Status Ongoing) Short Term Goal Medications reviewed  Discussed importance of adherence to all scheduled medical appointments Advised patient to report to care team increased difficulty breathing Discussed with the patient the use of Mucinex (guaifenesin) and its effectiveness of clearing the mucus in the air passages by thinning it out and making it easier for the patient to cough up Reviewed with patient the continued need to use her Incentive Spirometer to exercise her lungs encouraging use every 1-2 hours while awake, taking slow, deep breaths, increasing lung capacity(reviewed again w/ patient on 07/10/23) Assessed social  determinant of health barriers     Patient Goals/Self-Care Activities: Participate in Transition of Care Program/Attend TOC scheduled calls Take all medications as prescribed Attend all scheduled provider appointments Call pharmacy for medication refills 3-7 days in advance of running out of medications Perform all self care activities independently  Call provider office for new concerns or questions   Follow Up Plan:  The patient has been provided with contact information for the care management team and has been advised to call with any health related questions or concerns.          Plan: The patient has been provided with contact information for the care management team and has been advised to call with any health related questions or concerns.   Alyse Low, RN, BA, [facility], CRRN [facility] Coordinator, Transition of Care Ph # [phone]

## 2023-07-25 ENCOUNTER — Other Ambulatory Visit: Payer: Self-pay

## 2023-07-25 ENCOUNTER — Telehealth: Payer: Self-pay

## 2023-07-26 NOTE — Patient Instructions (Signed)
Visit Information  Hi Miss [name],  Thank you for taking time to visit with me today. Please don't hesitate to contact me if I can be of assistance to you before our next scheduled telephone appointment.  Our next, and final post-hospitalization appointment is by telephone on 07/31/2023 at 2pm.  Following is a copy of your care plan:   Goals Addressed             This Visit's Progress    Transition of Care       Current Barriers:  Knowledge Deficits related to plan of care for management of post operative pain, recuperation needs & return to previous level of physical functioning after undergoing a laparoscopic partial colectomy to remove a colonic mass.  RNCM Clinical Goal(s):  Patient will work with the Care Management team over the next 30 days to address Transition of Care Barriers: Medication Management Diet/Nutrition/Food Resources Support at home Provider appointments Home Health services Equipment/DME Functional/Safety through collaboration with RN Care manager, provider, and care team.   Interventions: Evaluation of current treatment plan related to  self management and patient's adherence to plan as established by provider   Pain Interventions:  (Status:  Ongoing) Short Term Goal Pain assessment performed Medications reviewed Reviewed provider established plan for pain management Discussed importance of adherence to all scheduled medical appointments Advised patient to report to care team affect of pain on daily activities Discussed use of relaxation techniques and/or diversional activities to assist with pain reduction (distraction, imagery, relaxation, massage, acupressure, TENS, heat, and cold application Reviewed with patient prescribed pharmacological and nonpharmacological pain relief strategies Assessed social determinant of health barriers   Surgery (Laparoscopic partial colectomy):  (Status: Ongoing) Short Term Goal Evaluation of current treatment plan  related to Colon surgery assessed patient/caregiver understanding of surgical procedure   reviewed post-operative instructions with patient/caregiver addressed questions about post - surgical incision care  reviewed medications with patient and addressed questions reviewed scheduled provider appointments with patientHas Post operative follow up with [name] on 07/04/23 @ 11:30am 07/03/23 Update - Patient was readmitted to [facility] on 06/26/23-06/29/23 for pneumonia and re-enrolled in TOC30d program on 1/6. Patient completed post-hospitalization follow up with PCP Dr. Jonah Blue on 1/20 and stated "everything looked good" except she remains anemic and does need to continue daily iron intake and have follow up lab in 2 weeks to check hemaglobin. She also has a 2/12 appt with Neurology Christophe Louis for Nerve conduction study & EMG due to her complaint of increased involuntary muscle twitches. confirmed availability of transportation to all appointments Patient does not have any transportation challenges getting to appointments  Reamission for Pneumonia 06/26/23. Additional Goal of Optimizing respiratory function and effective breathing (Status Ongoing) Short Term Goal Medications reviewed  Discussed importance of adherence to all scheduled medical appointments Advised patient to report to care team increased difficulty breathing Discussed with the patient the use of Mucinex (guaifenesin) and its effectiveness of clearing the mucus in the air passages by thinning it out and making it easier for the patient to cough up Reviewed with patient the continued need to use her Incentive Spirometer to exercise her lungs encouraging use every 1-2 hours while awake, taking slow, deep breaths, increasing lung capacity(reviewed again w/ patient on 07/10/23) Assessed social determinant of health barriers     Patient Goals/Self-Care Activities: Participate in Transition of Care Program/Attend TOC  scheduled calls Take all medications as prescribed Attend all scheduled provider appointments Call pharmacy for medication refills  3-7 days in advance of running out of medications Perform all self care activities independently  Call provider office for new concerns or questions   Follow Up Plan:  The patient has been provided with contact information for the care management team and has been advised to call with any health related questions or concerns.   Next appointment with RN Case Manager Elnita Maxwell is scheduled via telephone on 07/31/23 @ 2pm.        Patient verbalizes understanding of instructions and care plan provided today and agrees to view in MyChart. Active MyChart status and patient understanding of how to access instructions and care plan via MyChart confirmed with patient.     The patient has been provided with contact information for the care management team and has been advised to call with any health related questions or concerns.   Please call the care guide team at [phone] if you need to cancel or reschedule your appointment.   Please call 1-800-273-TALK (toll free, 24 hour hotline) if you are experiencing a Mental Health or Behavioral Health Crisis or need someone to talk to.  Alyse Low, RN, BA, [facility], CRRN [facility] Coordinator, Transition of Care Ph # [phone]

## 2023-07-26 NOTE — Patient Outreach (Signed)
Care Management  Transitions of Care Program Transitions of Care Post-discharge week 3   07/26/2023 Name: [name] MRN: [id number] DOB: Nov 20, 1950  Subjective: [name] is a 73 y.o. year old female who is a primary care patient of Marcine Matar, MD. The Care Management team Engaged with patient Engaged with patient by telephone to assess and address transitions of care needs.   Consent to Services:  Patient was given information about care management services, agreed to services, and gave verbal consent to participate.   Assessment:           SDOH Interventions    Flowsheet Row Office Visit from 07/17/2023 in [facility] Health Comm Health [place] - A Dept Of [place]. [facility] ED to Hosp-Admission (Discharged) from 06/26/2023 in [facility] 5 EAST MEDICAL UNIT Telephone from 06/13/2023 in [place] POPULATION HEALTH DEPARTMENT Clinical Support from 01/24/2023 in [facility] Health Comm Health [place] - A Dept Of [place]. [facility] Telephone from 06/28/2022 in [facility] Community Care Coordination Nutrition from 05/24/2021 in [place] [facility] Nutr Diab Ed  - A Dept Of . [facility]  SDOH Interventions        Food Insecurity Interventions Intervention Not Indicated Intervention Not Indicated, Inpatient TOC -- Intervention Not Indicated Intervention Not Indicated Intervention Not Indicated  Housing Interventions Intervention Not Indicated Intervention Not Indicated, Inpatient TOC Intervention Not Indicated Intervention Not Indicated -- --  Transportation Interventions Intervention Not Indicated Intervention Not Indicated, Inpatient TOC -- Intervention Not Indicated Intervention Not Indicated --  Utilities Interventions Intervention Not Indicated -- -- Intervention Not Indicated -- --  Alcohol Usage Interventions Intervention Not Indicated (Score <7) -- -- Intervention Not Indicated (Score <7) -- --  Financial  Strain Interventions Intervention Not Indicated -- -- Intervention Not Indicated -- --  Physical Activity Interventions [employer] -- -- Intervention Not Indicated -- --  Stress Interventions Intervention Not Indicated -- -- Intervention Not Indicated -- --  Social Connections Interventions Intervention Not Indicated -- -- Intervention Not Indicated -- --  Health Literacy Interventions Intervention Not Indicated -- -- Intervention Not Indicated -- --        Goals Addressed             This Visit's Progress    Transition of Care       Current Barriers:  Knowledge Deficits related to plan of care for management of post operative pain, recuperation needs & return to previous level of physical functioning after undergoing a laparoscopic partial colectomy to remove a colonic mass.  RNCM Clinical Goal(s):  Patient will work with the Care Management team over the next 30 days to address Transition of Care Barriers: Medication Management Diet/Nutrition/Food Resources Support at home Provider appointments Home Health services Equipment/DME Functional/Safety through collaboration with RN Care manager, provider, and care team.   Interventions: Evaluation of current treatment plan related to  self management and patient's adherence to plan as established by provider   Pain Interventions:  (Status:  Ongoing) Short Term Goal Pain assessment performed Medications reviewed Reviewed provider established plan for pain management Discussed importance of adherence to all scheduled medical appointments Advised patient to report to care team affect of pain on daily activities Discussed use of relaxation techniques and/or diversional activities to assist with pain reduction (distraction, imagery, relaxation, massage, acupressure, TENS, heat, and cold application Reviewed with patient prescribed pharmacological and nonpharmacological pain relief strategies Assessed social determinant of health  barriers  Surgery (Laparoscopic partial colectomy):  (Status: Ongoing) Short Term Goal Evaluation of current treatment plan related to Colon surgery assessed patient/caregiver understanding of surgical procedure   reviewed post-operative instructions with patient/caregiver addressed questions about post - surgical incision care  reviewed medications with patient and addressed questions reviewed scheduled provider appointments with patientHas Post operative follow up with Romie Levee on 07/04/23 @ 11:30am 07/03/23 Update - Patient was readmitted to [facility] on 06/26/23-06/29/23 for pneumonia and re-enrolled in TOC30d program on 1/6. Patient completed post-hospitalization follow up with PCP Dr. Jonah Blue on 1/20 and stated "everything looked good" except she remains anemic and does need to continue daily iron intake and have follow up lab in 2 weeks to check hemaglobin. She also has a 2/12 appt with Neurology Christophe Louis for Nerve conduction study & EMG due to her complaint of increased involuntary muscle twitches. confirmed availability of transportation to all appointments Patient does not have any transportation challenges getting to appointments  Reamission for Pneumonia 06/26/23. Additional Goal of Optimizing respiratory function and effective breathing (Status Ongoing) Short Term Goal Medications reviewed  Discussed importance of adherence to all scheduled medical appointments Advised patient to report to care team increased difficulty breathing Discussed with the patient the use of Mucinex (guaifenesin) and its effectiveness of clearing the mucus in the air passages by thinning it out and making it easier for the patient to cough up Reviewed with patient the continued need to use her Incentive Spirometer to exercise her lungs encouraging use every 1-2 hours while awake, taking slow, deep breaths, increasing lung capacity(reviewed again w/ patient on 07/10/23) Assessed social  determinant of health barriers     Patient Goals/Self-Care Activities: Participate in Transition of Care Program/Attend TOC scheduled calls Take all medications as prescribed Attend all scheduled provider appointments Call pharmacy for medication refills 3-7 days in advance of running out of medications Perform all self care activities independently  Call provider office for new concerns or questions   Follow Up Plan:  The patient has been provided with contact information for the care management team and has been advised to call with any health related questions or concerns.   Next appointment with RN Case Manager Elnita Maxwell is scheduled via telephone on 07/31/23 @ 2pm.        Plan: The patient has been provided with contact information for the care management team and has been advised to call with any health related questions or concerns.   Alyse Low, RN, BA, [facility], CRRN [facility] Coordinator, Transition of Care Ph # [phone]

## 2023-07-31 ENCOUNTER — Other Ambulatory Visit: Payer: Self-pay

## 2023-07-31 ENCOUNTER — Telehealth: Payer: Self-pay

## 2023-07-31 NOTE — Patient Outreach (Signed)
  Care Management  Transitions of Care Program Transitions of Care Post-discharge week 4  07/31/2023 Name: [name] MRN: [id number] DOB: [birth year]  Subjective: [name] is a 73 y.o. year old female who is a primary care patient of Marcine Matar, MD. The Care Management team spoke with patient by telephone to assess and address transitions of care needs but patient was unable to talk at the time due to driving her husband to a medical appointment..   Plan: Additional outreach attempts will be made to reach the patient enrolled in the [facility] Program (Post Inpatient/ED Visit). Telephone appointment changed to 08/01/23 @ 1pm.   Alyse Low, RN, BA, [facility], CRRN [facility] Population Health Care Management Coordinator, Transition of Care Ph # [phone]

## 2023-08-01 ENCOUNTER — Telehealth: Payer: Self-pay

## 2023-08-01 ENCOUNTER — Other Ambulatory Visit: Payer: Self-pay

## 2023-08-02 ENCOUNTER — Encounter: Payer: Self-pay | Admitting: Internal Medicine

## 2023-08-02 NOTE — Progress Notes (Signed)
 We received no list from [facility] on 07/25/2023 informing us  that they called the patient to schedule her diabetic eye exam.  Patient declined stating that her husband recently had knee surgery so she is unable to schedule at this time but will call back later to schedule.

## 2023-08-04 NOTE — Patient Instructions (Signed)
 Visit Information  Dear [name] (and your little dog Buster who rarely leaves your side),  Thank you for taking time to visit with me today. I'm sad that this was our final Transition of Care/post-hospitalization engagement because it has been a pleasure working with you, but I am also thrilled to know that you are successfully managing your own health very well and even assisting your husband recuperate from recent knee surgery.   Please don't hesitate to contact me if I can be of assistance to you in the future.   Warm Regards,  Channing    Following is a copy of your care plan:   Goals Addressed             This Visit's Progress    COMPLETED: Transition of Care       Current Barriers:  Knowledge Deficits related to plan of care for management of post operative pain, recuperation needs & return to previous level of physical functioning after undergoing a laparoscopic partial colectomy to remove a colonic mass.  RNCM Clinical Goal(s):  Patient will work with the Care Management team over the next 30 days to address Transition of Care Barriers: Medication Management Diet/Nutrition/Food Resources Support at home Provider appointments Home Health services Equipment/DME Functional/Safety through collaboration with RN Care manager, provider, and care team.   Interventions: Evaluation of current treatment plan related to  self management and patient's adherence to plan as established by provider   Pain Interventions:  (Status:  Ongoing) Short Term Goal Pain assessment performed Medications reviewed Reviewed provider established plan for pain management Discussed importance of adherence to all scheduled medical appointments Advised patient to report to care team affect of pain on daily activities Discussed use of relaxation techniques and/or diversional activities to assist with pain reduction (distraction, imagery, relaxation, massage, acupressure, TENS, heat, and cold  application Reviewed with patient prescribed pharmacological and nonpharmacological pain relief strategies Assessed social determinant of health barriers   Surgery (Laparoscopic partial colectomy):  (Status: Ongoing) Short Term Goal Evaluation of current treatment plan related to Colon surgery assessed patient/caregiver understanding of surgical procedure   reviewed post-operative instructions with patient/caregiver addressed questions about post - surgical incision care  reviewed medications with patient and addressed questions reviewed scheduled provider appointments with patientHas Post operative follow up with Bernarda Ned on 07/04/23 @ 11:30am 07/03/23 Update - Patient was readmitted to [facility] on 06/26/23-06/29/23 for pneumonia and re-enrolled in TOC30d program on 1/6. Patient completed post-hospitalization follow up with PCP Dr. Barnie Louder on 1/20 and stated everything looked good except she remains anemic and does need to continue daily iron  intake and have follow up lab in 2 weeks to check hemaglobin. She also has a 2/12 appt with Neurology Duwaine Ahle for Nerve conduction study & EMG due to her complaint of increased involuntary muscle twitches. confirmed availability of transportation to all appointments Patient does not have any transportation challenges getting to appointments  Reamission for Pneumonia 06/26/23. Additional Goal of Optimizing respiratory function and effective breathing (Status Ongoing) Short Term Goal Medications reviewed  Discussed importance of adherence to all scheduled medical appointments Advised patient to report to care team increased difficulty breathing Discussed with the patient the use of Mucinex  (guaifenesin ) and its effectiveness of clearing the mucus in the air passages by thinning it out and making it easier for the patient to cough up Reviewed with patient the continued need to use her Incentive Spirometer to exercise her lungs  encouraging use every 1-2 hours  while awake, taking slow, deep breaths, increasing lung capacity(reviewed again w/ patient on 07/10/23) Assessed social determinant of health barriers     Patient Goals/Self-Care Activities: Participate in Transition of Care Program/Attend TOC scheduled calls Take all medications as prescribed Attend all scheduled provider appointments Call pharmacy for medication refills 3-7 days in advance of running out of medications Perform all self care activities independently  Call provider office for new concerns or questions   Follow Up Plan:  The patient has been provided with contact information for the care management team and has been advised to call with any health related questions or concerns.           Patient verbalizes understanding of instructions and care plan provided today and agrees to view in MyChart. Active MyChart status and patient understanding of how to access instructions and care plan via MyChart confirmed with patient.     The patient has been provided with contact information for the care management team and has been advised to call with any health related questions or concerns.   Please call the care guide team at [phone] if you need to cancel or reschedule your appointment.   Please call 1-800-273-TALK (toll free, 24 hour hotline) if you are experiencing a Mental Health or Behavioral Health Crisis or need someone to talk to.  Channing Larry, RN, BA, [facility], CRRN [facility] Coordinator, Transition of Care Ph # [phone]

## 2023-08-04 NOTE — Patient Outreach (Signed)
 Care Management  Transitions of Care Program Transitions of Care Post-discharge week 4   08/01/2023 Name: [name] MRN: [id number] DOB: September 14, 1950  Subjective: [name] is a 73 y.o. year old female who is a primary care patient of Vicci Barnie NOVAK, MD. The Care Management team Engaged with patient Engaged with patient by telephone to assess and address transitions of care needs.   Consent to Services:  Patient was given information about care management services, agreed to services, and gave verbal consent to participate.   Assessment:           SDOH Interventions    Flowsheet Row Office Visit from 07/17/2023 in [facility] [place] [facility] Of [place] ED to Hosp-Admission (Discharged) from 06/26/2023 in [facility] EAST MEDICAL UNIT Telephone from 06/13/2023 in [facility]ATION [facility] DEPARTMENT Clinical Support from 01/24/2023 in [facility] [place] [facility] Of [place]. [facility] Telephone from 06/28/2022 in [facility] Community Care Coordination Nutrition from 05/24/2021 in [facility] Nutr Diab Ed  - A Dept Of . [facility]  SDOH Interventions        Food Insecurity Interventions Intervention Not Indicated Intervention Not Indicated, Inpatient TOC -- Intervention Not Indicated Intervention Not Indicated Intervention Not Indicated  Housing Interventions Intervention Not Indicated Intervention Not Indicated, Inpatient TOC Intervention Not Indicated Intervention Not Indicated -- --  Transportation Interventions Intervention Not Indicated Intervention Not Indicated, Inpatient TOC -- Intervention Not Indicated Intervention Not Indicated --  Utilities Interventions Intervention Not Indicated -- -- Intervention Not Indicated -- --  Alcohol  Usage Interventions Intervention Not Indicated (Score <7) -- -- Intervention Not Indicated (Score <7) -- --  Financial  Strain Interventions Intervention Not Indicated -- -- Intervention Not Indicated -- --  Physical Activity Interventions [employer] -- -- Intervention Not Indicated -- --  Stress Interventions Intervention Not Indicated -- -- Intervention Not Indicated -- --  Social Connections Interventions Intervention Not Indicated -- -- Intervention Not Indicated -- --  Health Literacy Interventions Intervention Not Indicated -- -- Intervention Not Indicated -- --        Goals Addressed             This Visit's Progress    COMPLETED: Transition of Care       Current Barriers:  Knowledge Deficits related to plan of care for management of post operative pain, recuperation needs & return to previous level of physical functioning after undergoing a laparoscopic partial colectomy to remove a colonic mass.  RNCM Clinical Goal(s):  Patient will work with the Care Management team over the next 30 days to address Transition of Care Barriers: Medication Management Diet/Nutrition/Food Resources Support at home Provider appointments Home Health services Equipment/DME Functional/Safety through collaboration with RN Care manager, provider, and care team.   Interventions: Evaluation of current treatment plan related to  self management and patient's adherence to plan as established by provider   Pain Interventions:  (Status:  Ongoing) Short Term Goal Pain assessment performed Medications reviewed Reviewed provider established plan for pain management Discussed importance of adherence to all scheduled medical appointments Advised patient to report to care team affect of pain on daily activities Discussed use of relaxation techniques and/or diversional activities to assist with pain reduction (distraction, imagery, relaxation, massage, acupressure, TENS, heat, and cold application Reviewed with patient prescribed pharmacological and nonpharmacological pain relief strategies Assessed social determinant of  health barriers  Surgery (Laparoscopic partial colectomy):  (Status: Ongoing) Short Term Goal Evaluation of current treatment plan related to Colon surgery assessed patient/caregiver understanding of surgical procedure   reviewed post-operative instructions with patient/caregiver addressed questions about post - surgical incision care  reviewed medications with patient and addressed questions reviewed scheduled provider appointments with patientHas Post operative follow up with Bernarda Ned on 07/04/23 @ 11:30am 07/03/23 Update - Patient was readmitted to [facility] on 06/26/23-06/29/23 for pneumonia and re-enrolled in TOC30d program on 1/6. Patient completed post-hospitalization follow up with PCP Dr. Barnie Louder on 1/20 and stated everything looked good except she remains anemic and does need to continue daily iron  intake and have follow up lab in 2 weeks to check hemaglobin. She also has a 2/12 appt with Neurology Duwaine Ahle for Nerve conduction study & EMG due to her complaint of increased involuntary muscle twitches. confirmed availability of transportation to all appointments Patient does not have any transportation challenges getting to appointments  Reamission for Pneumonia 06/26/23. Additional Goal of Optimizing respiratory function and effective breathing (Status Ongoing) Short Term Goal Medications reviewed  Discussed importance of adherence to all scheduled medical appointments Advised patient to report to care team increased difficulty breathing Discussed with the patient the use of Mucinex  (guaifenesin ) and its effectiveness of clearing the mucus in the air passages by thinning it out and making it easier for the patient to cough up Reviewed with patient the continued need to use her Incentive Spirometer to exercise her lungs encouraging use every 1-2 hours while awake, taking slow, deep breaths, increasing lung capacity(reviewed again w/ patient on 07/10/23) Assessed  social determinant of health barriers     Patient Goals/Self-Care Activities: Participate in Transition of Care Program/Attend TOC scheduled calls Take all medications as prescribed Attend all scheduled provider appointments Call pharmacy for medication refills 3-7 days in advance of running out of medications Perform all self care activities independently  Call provider office for new concerns or questions   Follow Up Plan:  The patient has been provided with contact information for the care management team and has been advised to call with any health related questions or concerns.           Plan: The patient has been provided with contact information for the care management team and has been advised to call with any health related questions or concerns.   Channing Larry, RN, BA, [facility], CRRN [facility] Coordinator, Transition of Care Ph # [phone]

## 2023-08-09 ENCOUNTER — Telehealth: Payer: Self-pay | Admitting: Neurology

## 2023-08-09 ENCOUNTER — Ambulatory Visit: Payer: Self-pay | Admitting: Neurology

## 2023-08-09 ENCOUNTER — Ambulatory Visit (INDEPENDENT_AMBULATORY_CARE_PROVIDER_SITE_OTHER): Payer: Medicare HMO | Admitting: Neurology

## 2023-08-09 DIAGNOSIS — M542 Cervicalgia: Secondary | ICD-10-CM | POA: Diagnosis not present

## 2023-08-09 DIAGNOSIS — R269 Unspecified abnormalities of gait and mobility: Secondary | ICD-10-CM

## 2023-08-09 DIAGNOSIS — Z0289 Encounter for other administrative examinations: Secondary | ICD-10-CM

## 2023-08-09 DIAGNOSIS — G8929 Other chronic pain: Secondary | ICD-10-CM

## 2023-08-09 NOTE — Procedures (Signed)
Full Name: [name] Gender: Female MRN #: [id number] Date of Birth: 09-07-1950    Visit Date: 08/09/2023 10:32 Age: 73 Years Examining Physician: Dr. Levert Feinstein Referring Physician: Dr. Levert Feinstein Height: 5 feet 8 inch History: 73 year old female with history of diabetes, chronic neck pain, bilateral shoulder pain, upper extremity paresthesia,  Summary of the test: Nerve conduction study: Left superficial peroneal sensory responses were absent.  Left sural sensory responses show significantly decreased snap amplitude.  Bilateral ulnar, median sensory responses showed mildly prolonged peak latency, with mildly decreased or low normal snap amplitude.  Bilateral median and ulnar mixed responses were within normal limit.  Bilateral median, ulnar motor responses were within normal limit.  Left tibial motor responses showed normal CMAP amplitude at distal stimulation site, suboptimal stimulations at proximal stimulation site.  Left peroneal to EDB motor response showed mildly decreased CMAP amplitude.  Electromyography: Selected needle examination of right upper extremity muscles were normal.  She was not able to tolerate more extensive needle examination.   Conclusion: This is a mild abnormal study.  There is electrodiagnostic evidence of mild length-dependent axonal peripheral neuropathy, there was no evidence of active right cervical radiculopathy.    Levert Feinstein. M.D. Ph.D.   [facility] [address] [place], [place] [address] Tel: [phone] Fax: [phone]  Verbal informed consent was obtained from the patient, patient was informed of potential risk of procedure, including bruising, bleeding, hematoma formation, infection, muscle weakness, muscle pain, numbness, among others.        MNC    Nerve / Sites Muscle Latency Ref. Amplitude Ref. Rel Amp Segments Distance Velocity Ref. Area    ms ms mV mV %  cm m/s m/s mVms  L Median  - APB     Wrist APB 3.8 <=4.4 7.0 >=4.0 100 Wrist - APB 7   26.8     Upper arm APB 8.7  6.1  86.9 Upper arm - Wrist 25 51 >=49 23.2  R Median - APB     Wrist APB 4.1 <=4.4 6.7 >=4.0 100 Wrist - APB 7   22.8     Upper arm APB 9.0  5.0  74.2 Upper arm - Wrist 26 54 >=49 19.2  L Ulnar - ADM     Wrist ADM 3.3 <=3.3 8.9 >=6.0 100 Wrist - ADM 7   24.0     B.Elbow ADM 5.1  9.2  104 B.Elbow - Wrist 10 53 >=49 25.0     A.Elbow ADM 9.3  7.4  80.3 A.Elbow - B.Elbow 21 50 >=49 22.8  R Ulnar - ADM     Wrist ADM 3.0 <=3.3 9.0 >=6.0 100 Wrist - ADM 7   28.7     B.Elbow ADM 5.3  8.2  90.4 B.Elbow - Wrist 12 54 >=49 28.3     A.Elbow ADM 8.8  7.7  94.6 A.Elbow - B.Elbow 19 53 >=49 27.6  L Peroneal - EDB     Ankle EDB 4.7 <=6.5 1.4 >=2.0 100 Ankle - EDB 9   4.4     Fib head EDB 12.9  1.1  76.2 Fib head - Ankle 32 39 >=44 3.3     Pop fossa EDB      Pop fossa - Fib head   >=44          Pop fossa - Ankle      L Tibial - AH     Ankle AH 4.9 <=5.8 5.7 >=  4.0 100 Ankle - AH 9   12.4     Pop fossa AH 15.5  0.7  12.7 Pop fossa - Ankle 38 36 >=41 1.0                 SNC    Nerve / Sites Rec. Site Peak Lat Ref.  Amp Ref. Segments Distance Peak Diff Ref.    ms ms V V  cm ms ms  L Sural - Ankle (Calf)     Calf Ankle 3.0 <=4.4 2 >=6 Calf - Ankle 14    L Superficial peroneal - Ankle     Lat leg Ankle NR <=4.4 NR >=6 Lat leg - Ankle 14    R Median, Ulnar - Transcarpal comparison     Median Palm Wrist 2.4 <=2.2 47 >=35 Median Palm - Wrist 8       Ulnar Palm Wrist 2.3 <=2.2 12 >=12 Ulnar Palm - Wrist 8          Median Palm - Ulnar Palm  0.1 <=0.4  L Median, Ulnar - Transcarpal comparison     Median Palm Wrist 2.5 <=2.2 35 >=35 Median Palm - Wrist 8       Ulnar Palm Wrist 2.4 <=2.2 24 >=12 Ulnar Palm - Wrist 8          Median Palm - Ulnar Palm  0.1 <=0.4  R Median - Orthodromic (Dig II, Mid palm)     Dig II Wrist 3.6 <=3.4 9 >=10 Dig II - Wrist 13    L Median - Orthodromic (Dig II, Mid palm)     Dig II Wrist  3.5 <=3.4 12 >=10 Dig II - Wrist 13    R Ulnar - Orthodromic, (Dig V, Mid palm)     Dig V Wrist 3.3 <=3.1 6 >=5 Dig V - Wrist 11    L Ulnar - Orthodromic, (Dig V, Mid palm)     Dig V Wrist 3.5 <=3.1 6 >=5 Dig V - Wrist 82                       F  Wave    Nerve F Lat Ref.   ms ms  L Tibial - AH 62.3 <=56.0  L Ulnar - ADM 32.6 <=32.0  R Ulnar - ADM 33.1 <=32.0           EMG Summary Table    Spontaneous MUAP Recruitment  Muscle IA Fib PSW Fasc Other Amp Dur. Poly Pattern  R. First dorsal interosseous Normal None None None _______ Normal Normal Normal Normal  R. Pronator teres Normal None None None _______ Normal Normal Normal Normal  R. Biceps brachii Normal None None None _______ Normal Normal Normal Normal  R. Deltoid Normal None None None _______ Normal Normal Normal Normal  R. Triceps brachii Normal None None None _______ Normal Normal Normal Normal  R. Cervical paraspinals Normal None None None _______ Normal Normal Normal Normal

## 2023-08-09 NOTE — Telephone Encounter (Signed)
Error

## 2023-08-10 NOTE — Progress Notes (Signed)
EMG nerve conduction study report is under procedure tab

## 2023-08-15 ENCOUNTER — Encounter: Payer: Self-pay | Admitting: Pharmacist

## 2023-08-15 ENCOUNTER — Ambulatory Visit: Payer: Medicare HMO | Attending: Family Medicine | Admitting: Pharmacist

## 2023-08-15 VITALS — BP 134/65 | HR 83

## 2023-08-15 DIAGNOSIS — I10 Essential (primary) hypertension: Secondary | ICD-10-CM

## 2023-08-15 NOTE — Progress Notes (Signed)
    S:     No chief complaint on file.  73 y.o. female who presents for diabetes evaluation, education, and management. Patient arrives in good spirits and presents without  any assistance.   Patient was referred and last seen by Primary Care Provider, Dr. Laural Benes, on 07/17/23. BP was 148/71, however, she had not taken her antihypertensives prior to that appt. Additionally, regarding her DM, pt was restarted Ozempic 0.25 mg once weekly.  PMH is significant for  HTN, DM, HL, depression, chronic back pain, RT breast CA (intraductal CA, ER/PR+, lumpectomy and XRT - completed 08/2017), CKD stage 3, CVA 2011 (residual weakness on LT side), CAD s/p CABG and MVR/resection of fibroelastoma, IDA.  [name]   Today, she reports taking all meds. Trying to restart Ozempic, but is in the process of applying for patient assistance. No symptoms or concerns to report today.   Family/Social History:  -Fhx: colon cancer, IBS, prostate cancer, cirrhosis, stroke, prostate cancer, diabetes, kidney disease, cancer -Smoking: none  Current hypertension medications include:  -amlodipine 10 mg daily -carvedilol 25 mg daily -hydralazine 100 mg in the morning and at bedtime with 50 mg with lunch -valsartan 160 mg daily -furosemide 20 mg daily  Insurance coverage: [employer]  Patient denies hypoglycemic events.  Reported home fasting blood sugars: 122 (today morning)   Patient reported dietary habits: none reported  Patient-reported exercise habits: walking with help from therapist.  O:  Lab Results  Component Value Date   HGBA1C 8.5 (H) 06/27/2023   Vitals:   08/15/23 1508  BP: 134/65  Pulse: 83   Lipid Panel     Component Value Date/Time   CHOL 204 (H) 11/07/2022 1606   TRIG 114 11/07/2022 1606   HDL 98 11/07/2022 1606   CHOLHDL 2.1 11/07/2022 1606   CHOLHDL 2.8 04/28/2016 1028   VLDL 19 04/28/2016 1028   LDLCALC 87 11/07/2022 1606   LDLDIRECT 101.7 07/18/2011 0901    Clinical Atherosclerotic  Cardiovascular Disease (ASCVD): Yes  The ASCVD Risk score (Arnett DK, et al., 2019) failed to calculate for the following reasons:   Risk score cannot be calculated because patient has a medical history suggesting prior/existing ASCVD   A/P: Hypertension longstanding currently uncontrolled. Blood pressure goal of <130/80 mmHg.  -Continued amlodipine 10 mg daily -Continued carvedilol 25 mg daily -Continued hydralazine 100-50 mg BID -Continued valsartan 160 mg daily -Continued furosemide 20 mg daily  Written patient instructions provided. Patient verbalized understanding of treatment plan.  Total time in face to face counseling 30 minutes.    Follow-up:  Pharmacist 09/14/2023 PCP clinic visit in 11/14/2023  Patient seen with  Haywood Filler, PharmD Candidate [employer]  Class of 2027  Butch Penny, PharmD, [place], CPP Clinical Pharmacist [facility] [phone]

## 2023-08-17 ENCOUNTER — Telehealth: Payer: Self-pay | Admitting: Internal Medicine

## 2023-08-17 NOTE — Telephone Encounter (Signed)
Phone call placed to patient today to go over results of EMG that was done by neurologist Dr. Terrace Arabia.  I inquired from patient who had ordered the study.  She states it was the neurologist.  She had seen her in the past and the study was recommended but she just had it done.  I advised patient that it shows that she has some mild peripheral neuropathy which I am thinking is most likely due to diabetes.  Patient is already on gabapentin.

## 2023-08-21 ENCOUNTER — Other Ambulatory Visit: Payer: Self-pay | Admitting: Internal Medicine

## 2023-08-21 DIAGNOSIS — I152 Hypertension secondary to endocrine disorders: Secondary | ICD-10-CM

## 2023-08-21 DIAGNOSIS — F32A Depression, unspecified: Secondary | ICD-10-CM

## 2023-08-21 MED ORDER — BUPROPION HCL ER (XL) 150 MG PO TB24: 150.0000 mg | ORAL_TABLET | Freq: Two times a day (BID) | ORAL | 1 refills | Status: DC

## 2023-08-21 MED ORDER — INSULIN GLARGINE 100 UNIT/ML SOLOSTAR PEN: 10.0000 [IU] | PEN_INJECTOR | Freq: Every day | SUBCUTANEOUS | 3 refills | Status: DC

## 2023-08-21 MED ORDER — HYDRALAZINE HCL 100 MG PO TABS: ORAL_TABLET | ORAL | 1 refills | Status: DC

## 2023-08-21 NOTE — Telephone Encounter (Signed)
 Last Fill: Lantus: 03/09/23     Wellbutrin: 04/11/23     Hydralazine: 04/11/23  Last OV: 08/15/23 Next OV: 09/14/23  Routing to provider for review/authorization.

## 2023-08-21 NOTE — Telephone Encounter (Signed)
 Copied from CRM [id number]. Topic: Clinical - Medication Refill >> Aug 21, 2023 12:50 PM Hector Shade B wrote: Most Recent Primary Care Visit:  Provider: Yehuda Savannah L  Department: [facility]  Visit Type: OFFICE VISIT  Date: 08/15/2023  Medication: insulin glargine  (LANTUS) 100 UNIT/ML Solostar Pen buPROPion (WELLBUTRIN XL) 150 MG 24 hr tablet hydrALAZINE (APRESOLINE) 100 MG tablet  Has the patient contacted their pharmacy? Yes (Agent: If no, request that the patient contact the pharmacy for the refill. If patient does not wish to contact the pharmacy document the reason why and proceed with request.) (Agent: If yes, when and what did the pharmacy advise?)  Is this the correct pharmacy for this prescription? Yes If no, delete pharmacy and type the correct one.  This is the patient's preferred pharmacy:  [facility] [address] [place], [place] - [address] [place] [address] Phone: [phone] Fax: [phone]  [facility] - [place], [place] - [address] [place] [address] Phone: [phone] Fax: [phone]   Has the prescription been filled recently? Yes  Is the patient out of the medication? Yes  Has the patient been seen for an appointment in the last year OR does the patient have an upcoming appointment? Yes  Can we respond through MyChart? Yes  Agent: Please be advised that Rx refills may take up to 3 business days. We ask that you follow-up with your pharmacy.

## 2023-09-12 ENCOUNTER — Ambulatory Visit: Payer: Self-pay | Admitting: Internal Medicine

## 2023-09-12 DIAGNOSIS — I6782 Cerebral ischemia: Secondary | ICD-10-CM | POA: Diagnosis not present

## 2023-09-12 DIAGNOSIS — R059 Cough, unspecified: Secondary | ICD-10-CM | POA: Diagnosis not present

## 2023-09-12 DIAGNOSIS — R0789 Other chest pain: Secondary | ICD-10-CM | POA: Diagnosis not present

## 2023-09-12 DIAGNOSIS — R61 Generalized hyperhidrosis: Secondary | ICD-10-CM | POA: Diagnosis not present

## 2023-09-12 DIAGNOSIS — I499 Cardiac arrhythmia, unspecified: Secondary | ICD-10-CM | POA: Diagnosis not present

## 2023-09-12 DIAGNOSIS — D509 Iron deficiency anemia, unspecified: Secondary | ICD-10-CM | POA: Diagnosis not present

## 2023-09-12 DIAGNOSIS — Z951 Presence of aortocoronary bypass graft: Secondary | ICD-10-CM | POA: Diagnosis not present

## 2023-09-12 DIAGNOSIS — I4891 Unspecified atrial fibrillation: Secondary | ICD-10-CM | POA: Diagnosis not present

## 2023-09-12 DIAGNOSIS — E1122 Type 2 diabetes mellitus with diabetic chronic kidney disease: Secondary | ICD-10-CM | POA: Diagnosis not present

## 2023-09-12 DIAGNOSIS — R531 Weakness: Secondary | ICD-10-CM | POA: Diagnosis not present

## 2023-09-12 DIAGNOSIS — J449 Chronic obstructive pulmonary disease, unspecified: Secondary | ICD-10-CM | POA: Diagnosis not present

## 2023-09-12 DIAGNOSIS — R9431 Abnormal electrocardiogram [ECG] [EKG]: Secondary | ICD-10-CM | POA: Diagnosis not present

## 2023-09-12 DIAGNOSIS — N289 Disorder of kidney and ureter, unspecified: Secondary | ICD-10-CM | POA: Diagnosis not present

## 2023-09-12 DIAGNOSIS — N179 Acute kidney failure, unspecified: Secondary | ICD-10-CM | POA: Diagnosis not present

## 2023-09-12 DIAGNOSIS — E875 Hyperkalemia: Secondary | ICD-10-CM | POA: Diagnosis not present

## 2023-09-12 DIAGNOSIS — R519 Headache, unspecified: Secondary | ICD-10-CM | POA: Diagnosis not present

## 2023-09-12 DIAGNOSIS — R001 Bradycardia, unspecified: Secondary | ICD-10-CM | POA: Diagnosis not present

## 2023-09-12 DIAGNOSIS — I129 Hypertensive chronic kidney disease with stage 1 through stage 4 chronic kidney disease, or unspecified chronic kidney disease: Secondary | ICD-10-CM | POA: Diagnosis not present

## 2023-09-12 DIAGNOSIS — R5383 Other fatigue: Secondary | ICD-10-CM | POA: Diagnosis not present

## 2023-09-12 DIAGNOSIS — Z20822 Contact with and (suspected) exposure to covid-19: Secondary | ICD-10-CM | POA: Diagnosis not present

## 2023-09-12 NOTE — Telephone Encounter (Signed)
  Chief Complaint: chest congestion  Symptoms: fatigue, little cough Frequency: 1 week  Disposition: [] ED /[x] Urgent Care (no appt availability in office) / [] Appointment(In office/virtual)/ []  [facility]/ [] Home Care/ [] Refused Recommended Disposition /[] [place] [facility]/ []  Follow-up with PCP Additional Notes: Pt called with chest congestion that has been bothersome for a week. Pt has had to use inhaler and stated that hlped a lot. Pt said there is wheezing at times, but denies any SOB. Pt has some coughing and can be productive with clear mucus.  Pt feels really run down/fatigued. No fever. No provider appts until April. Pt didn't want to see another office PCP. RN advised pt go to urgent care. Pt stated she won't go today but will take more OTC medication and go tomorrow if not feeling any better. RN gave care advice and pt verbalized understanding.            Copied from CRM [id number]. Topic: Clinical - Red Word Triage >> Sep 12, 2023 12:57 PM Geroge Baseman wrote: Red Word that prompted transfer to Nurse Triage: wheezing, congestion in chest, headache, feeling very fatigued. mucous is light green when it comes up. Soreness/pain in the chest. Reason for Disposition  [1] Sinus congestion (pressure, fullness) AND [2] present > 10 days  Answer Assessment - Initial Assessment Questions 1. LOCATION: "Where does it hurt?"      chest 2. ONSET: "When did the sinus pain start?"  (e.g., hours, days)      This week 3. SEVERITY: "How bad is the pain?"   (Scale 1-10; mild, moderate or severe)   - MILD (1-3): doesn't interfere with normal activities    - MODERATE (4-7): interferes with normal activities (e.g., work or school) or awakens from sleep   - SEVERE (8-10): excruciating pain and patient unable to do any normal activities        Moderate  4. RECURRENT SYMPTOM: "Have you ever had sinus problems before?" If Yes, ask: "When was the last time?" and "What happened that time?"       Yes- had pneumonia in December  5. NASAL CONGESTION: "Is the nose blocked?" If Yes, ask: "Can you open it or must you breathe through your mouth?"     no 6. NASAL DISCHARGE: "Do you have discharge from your nose?" If so ask, "What color?"     Light green  7. FEVER: "Do you have a fever?" If Yes, ask: "What is it, how was it measured, and when did it start?"      Denies  8. OTHER SYMPTOMS: "Do you have any other symptoms?" (e.g., sore throat, cough, earache, difficulty breathing)     Headache, fatigued  Protocols used: Sinus Pain or Congestion-A-AH

## 2023-09-13 DIAGNOSIS — I422 Other hypertrophic cardiomyopathy: Secondary | ICD-10-CM | POA: Diagnosis not present

## 2023-09-13 DIAGNOSIS — R001 Bradycardia, unspecified: Secondary | ICD-10-CM | POA: Diagnosis not present

## 2023-09-14 ENCOUNTER — Ambulatory Visit: Payer: Medicare HMO | Admitting: Pharmacist

## 2023-09-14 DIAGNOSIS — R001 Bradycardia, unspecified: Secondary | ICD-10-CM | POA: Diagnosis not present

## 2023-09-15 DIAGNOSIS — R001 Bradycardia, unspecified: Secondary | ICD-10-CM | POA: Diagnosis not present

## 2023-09-17 ENCOUNTER — Other Ambulatory Visit: Payer: Self-pay | Admitting: Hematology and Oncology

## 2023-09-17 ENCOUNTER — Other Ambulatory Visit: Payer: Self-pay | Admitting: Cardiology

## 2023-09-19 ENCOUNTER — Telehealth: Payer: Self-pay

## 2023-09-19 NOTE — Transitions of Care (Post Inpatient/ED Visit) (Signed)
 09/19/2023  Name: [name] MRN: [id number] DOB: 1951-06-21  Today's TOC FU Call Status: Today's TOC FU Call Status:: Successful TOC FU Call Completed TOC FU Call Complete Date: 09/19/23 Patient's Name and Date of Birth confirmed.  Transition Care Management Follow-up Telephone Call Date of Discharge: 09/15/23 Discharge Facility: Other [facility]) Name of Other (Non-[facility]) Discharge Facility: [facility] Type of Discharge: Inpatient Admission Primary Inpatient Discharge Diagnosis:: symptomatic bradycardia How have you been since you were released from the hospital?: Better (patient states she feels 100% better once it was found that her Carvedilol med was responsible for her bradycardia, after the med was d'cd, heart rate returned WNL and more importantly, patient states she feels like she has so much more energy.) Any questions or concerns?: No  Items Reviewed: Did you receive and understand the discharge instructions provided?: Yes Medications obtained,verified, and reconciled?: Yes (Medications Reviewed) Any new allergies since your discharge?: No Dietary orders reviewed?: Yes Type of Diet Ordered:: Low sodium, Heart healthy, Carbohydrate controlled (diabetic) diet Do you have support at home?: Yes People in Home: spouse Name of Support/Comfort Primary Source: Patient lives with husband and her sidekick "Buster" a teacup Yorkie who is her constant companion  Medications Reviewed Today: Medications Reviewed Today     Reviewed by Marcos Eke, RN (Registered Nurse) on 09/19/23 at 1722  Med List Status: <None>   Medication Order Taking? Sig Documenting Provider Last Dose Status Informant  albuterol (VENTOLIN HFA) 108 (90 Base) MCG/ACT inhaler [id number] Yes Inhale 2 puffs into the lungs every 6 (six) hours as needed for wheezing or shortness of breath. Marcine Matar, MD Taking Active Self  amLODipine (NORVASC) 10  MG tablet [id number] Yes TAKE 1 TABLET EVERY DAY  Patient taking differently: Take 10 mg by mouth daily with lunch.   Marcine Matar, MD Taking Active Self  aspirin EC 81 MG tablet [id number] Yes Take 1 tablet (81 mg total) by mouth daily. Ambrose Finland, NP Taking Active Self  atorvastatin (LIPITOR) 80 MG tablet [id number] Yes TAKE 1 TABLET EVERY DAY. Marcine Matar, MD Taking Active   buPROPion (WELLBUTRIN XL) 150 MG 24 hr tablet [id number] Yes Take 1 tablet (150 mg total) by mouth 2 (two) times daily. Marcine Matar, MD Taking Active   carvedilol (COREG) 25 MG tablet [id number] Yes TAKE 1 TABLET TWICE DAILY Turner, Cornelious Bryant, MD Taking Active   celecoxib (CELEBREX) 50 MG capsule [id number] Yes Take 1 capsule (50 mg total) by mouth 2 (two) times daily as needed for pain. Levert Feinstein, MD Taking Active Self  diclofenac Sodium (VOLTAREN) 1 % GEL [id number] Yes APPLY 2 GRAMS TOPICALLY 4 (FOUR) TIMES DAILY AS NEEDED.  Patient taking differently: Apply 2 g topically 4 (four) times daily as needed (for pain).   Marcine Matar, MD Taking Active Self  docusate sodium (COLACE) 100 MG capsule [id number] Yes Take 100 mg by mouth daily as needed for mild constipation. [provider] Taking Active Self  DULoxetine (CYMBALTA) 30 MG capsule [id number] Yes Take 1 capsule (30 mg total) by mouth daily.  Patient taking differently: Take 30 mg by mouth daily with lunch.   Levert Feinstein, MD Taking Active Self  FARXIGA 10 MG TABS tablet [id number] Yes Take 10 mg by mouth daily with lunch. [provider] Taking Active Self  FEROSUL 325 (65 Fe) MG tablet [id number] Yes Take 325 mg by mouth daily with breakfast. [provider]  Taking Active Self  furosemide (LASIX) 20 MG tablet [id number] Yes Take 20 mg by mouth in the morning. [provider] Taking Active Self  gabapentin (NEURONTIN) 300 MG capsule [id number] Yes Take 1 capsule (300 mg total) by mouth 3 (three) times daily.  Patient  taking differently: Take 300 mg by mouth 3 (three) times daily as needed (for pain).   Levert Feinstein, MD Taking Active Self  hydrALAZINE (APRESOLINE) 100 MG tablet [id number] Yes Take 100 mg by mouth in the morning & at bedtime and 50 mg with lunch Marcine Matar, MD Taking Active   insulin glargine (LANTUS) 100 UNIT/ML Solostar Pen [id number] Yes Inject 10 Units into the skin at bedtime. Marcine Matar, MD Taking Active   Insulin Pen Needle (PEN NEEDLES) 31G X 8 MM MISC [id number] Yes UAD Marcine Matar, MD Taking Active   letrozole [facility]) 2.5 MG tablet [id number] Yes Take 1 tablet (2.5 mg total) by mouth daily with lunch. Serena Croissant, MD Taking Active   methocarbamol (ROBAXIN) 500 MG tablet [id number] Yes TAKE 1 TABLET EVERY 8 HOURS AS NEEDED FOR MUSCLE SPASMS.  Patient taking differently: Take 500 mg by mouth every 8 (eight) hours as needed for muscle spasms.   Marcine Matar, MD Taking Active Self  Multiple Vitamin (MULTIVITAMIN WITH MINERALS) TABS tablet [id number] Yes Take 1 tablet by mouth daily with breakfast. [provider] Taking Active Self  pantoprazole (PROTONIX) 40 MG tablet [id number] Yes Take 1 tablet (40 mg total) by mouth daily.  Patient taking differently: Take 40 mg by mouth daily before breakfast.   Tereso Newcomer T, PA-C Taking Active Self  polyethylene glycol powder (GLYCOLAX/MIRALAX) 17 GM/SCOOP powder [id number] Yes Take 17 g by mouth daily as needed for mild constipation. [provider] Taking Active Self  Semaglutide,0.25 or 0.5MG /DOS, 2 MG/3ML SOPN [id number] Yes Inject 0.25 mg into the skin once a week. Marcine Matar, MD Taking Active   traMADol Janean Sark) 50 MG tablet [id number] Yes Take 1 tablet (50 mg total) by mouth every 12 (twelve) hours as needed.  Patient taking differently: Take 50 mg by mouth every 12 (twelve) hours as needed (for pain).   Marcine Matar, MD Taking Active Self  traZODone (DESYREL) 50 MG tablet [id number] Yes  Take 1 tablet (50 mg total) by mouth at bedtime as needed. for sleep Marcine Matar, MD Taking Active   valsartan (DIOVAN) 160 MG tablet [id number] Yes Take 1 tablet (160 mg total) by mouth daily. Almon Hercules, MD Taking Active             Home Care and Equipment/Supplies: Were Home Health Services Ordered?: NA Any new equipment or medical supplies ordered?: NA  Functional Questionnaire: Do you need assistance with bathing/showering or dressing?: No Do you need assistance with meal preparation?: No Do you need assistance with eating?: No Do you have difficulty maintaining continence: No Do you need assistance with getting out of bed/getting out of a chair/moving?: No Do you have difficulty managing or taking your medications?: No  Follow up appointments reviewed: PCP Follow-up appointment confirmed?: Yes (Pt does NOT want to schedule an earlier visit w/ PCP as she has an in-person follow up appt on 4/15 w/ Emilia Beck Ausdell Clinical Pharmacist Practitoner (CPP) Adm to hosp due to symptomatic bradycardia found to be d/t carvedilol which med was stopped.) Date of PCP follow-up appointment?: 11/14/23 Follow-up Provider: Dr. Jonah Blue 5/20; Has 4/15 OV with Dr. Henriette Combs [facility]'s CPP Emilia Beck Ausdell  Specialist Hospital Follow-up appointment confirmed?: Yes Date of Specialist follow-up appointment?: 09/28/23 Follow-Up Specialty Provider:: Cardiology, to see Armanda Magic Do you need transportation to your follow-up appointment?: No Do you understand care options if your condition(s) worsen?: Yes-patient verbalized understanding    Wright Gravely A. Mliss Fritz RN, BA, [facility], CRRN [facility] RN Care Manager, Transition of Care [phone]

## 2023-09-19 NOTE — Transitions of Care (Post Inpatient/ED Visit) (Signed)
   09/19/2023  Name: [name] MRN: [id number] DOB: [birth year]  Today's TOC FU Call Status: Today's TOC FU Call Status:: Unsuccessful Call (1st Attempt) Unsuccessful Call (1st Attempt) Date: 09/19/23  Attempted to reach the patient regarding the most recent Inpatient/ED visit.  Follow Up Plan: Additional outreach attempts will be made to reach the patient to complete the Transitions of Care (Post Inpatient/ED visit) call.   Stassi Fadely A. Mliss Fritz RN, BA, [facility], CRRN [facility] RN Care Manager, Transition of Care [phone]

## 2023-09-21 ENCOUNTER — Ambulatory Visit: Payer: Medicare PPO | Admitting: Cardiology

## 2023-09-26 ENCOUNTER — Inpatient Hospital Stay: Admitting: Nurse Practitioner

## 2023-09-26 ENCOUNTER — Telehealth: Payer: Self-pay

## 2023-09-26 ENCOUNTER — Other Ambulatory Visit: Payer: Self-pay

## 2023-09-26 NOTE — Patient Outreach (Signed)
 Care Management  Transitions of Care Program Transitions of Care Post-discharge week 2  09/26/2023 Name: [name] MRN: [id number] DOB: [birth year]  Subjective: [name] is a 73 y.o. year old female who is a primary care patient of Marcine Matar, MD. The Care Management team was asked to reschedule appointment for another day this week to assess and address transitions of care needs.   Plan: Additional outreach attempts will be made to reach the patient enrolled in the [facility] (Post Inpatient/ED Visit). New appointment time is Thursday, 09/28/23 @ 1:30pm.  Elnita Maxwell A. Mliss Fritz RN, BA, [facility], CRRN [facility] RN Care Manager, Transition of Care [phone]

## 2023-09-28 ENCOUNTER — Encounter: Payer: Self-pay | Admitting: Cardiology

## 2023-09-28 ENCOUNTER — Ambulatory Visit: Payer: Self-pay | Attending: Cardiology | Admitting: Cardiology

## 2023-09-28 ENCOUNTER — Other Ambulatory Visit: Payer: Self-pay

## 2023-09-28 ENCOUNTER — Telehealth: Payer: Self-pay | Admitting: *Deleted

## 2023-09-28 VITALS — BP 180/80 | HR 78 | Resp 14 | Ht 68.0 in | Wt 266.8 lb

## 2023-09-28 DIAGNOSIS — M9903 Segmental and somatic dysfunction of lumbar region: Secondary | ICD-10-CM | POA: Diagnosis not present

## 2023-09-28 DIAGNOSIS — I350 Nonrheumatic aortic (valve) stenosis: Secondary | ICD-10-CM | POA: Diagnosis not present

## 2023-09-28 DIAGNOSIS — I10 Essential (primary) hypertension: Secondary | ICD-10-CM | POA: Diagnosis not present

## 2023-09-28 DIAGNOSIS — Z9889 Other specified postprocedural states: Secondary | ICD-10-CM

## 2023-09-28 DIAGNOSIS — I2510 Atherosclerotic heart disease of native coronary artery without angina pectoris: Secondary | ICD-10-CM | POA: Diagnosis not present

## 2023-09-28 DIAGNOSIS — I272 Pulmonary hypertension, unspecified: Secondary | ICD-10-CM

## 2023-09-28 DIAGNOSIS — M25552 Pain in left hip: Secondary | ICD-10-CM | POA: Diagnosis not present

## 2023-09-28 DIAGNOSIS — M62830 Muscle spasm of back: Secondary | ICD-10-CM | POA: Diagnosis not present

## 2023-09-28 DIAGNOSIS — M25551 Pain in right hip: Secondary | ICD-10-CM | POA: Diagnosis not present

## 2023-09-28 DIAGNOSIS — E7800 Pure hypercholesterolemia, unspecified: Secondary | ICD-10-CM

## 2023-09-28 DIAGNOSIS — M25561 Pain in right knee: Secondary | ICD-10-CM | POA: Diagnosis not present

## 2023-09-28 DIAGNOSIS — M9901 Segmental and somatic dysfunction of cervical region: Secondary | ICD-10-CM | POA: Diagnosis not present

## 2023-09-28 DIAGNOSIS — M25562 Pain in left knee: Secondary | ICD-10-CM | POA: Diagnosis not present

## 2023-09-28 DIAGNOSIS — M5417 Radiculopathy, lumbosacral region: Secondary | ICD-10-CM | POA: Diagnosis not present

## 2023-09-28 MED ORDER — HYDRALAZINE HCL 100 MG PO TABS: 100.0000 mg | ORAL_TABLET | Freq: Three times a day (TID) | ORAL | 3 refills | Status: DC

## 2023-09-28 NOTE — Telephone Encounter (Signed)
 Patient agreement reviewed and signed on 09/28/2023.  WatchPAT issued to patient on 09/28/2023 by Danielle Rankin, CMA. Patient aware to not open the WatchPAT box until contacted with the activation PIN. Patient profile initialized in CloudPAT on 09/28/2023 by Danielle Rankin, CMA. Device serial number: [id number]  Please list Reason for Call as Advice Only and type "WatchPAT issued to patient" in the comment box.

## 2023-09-28 NOTE — Patient Instructions (Signed)
 Medication Instructions:  Your physician has recommended you make the following change in your medication:   1) INCREASE hydralazine to 100 mg three times daily  *If you need a refill on your cardiac medications before your next appointment, please call your pharmacy*  Lab Work: TODAY: FLP, ALT If you have labs (blood work) drawn today and your tests are completely normal, you will receive your results only by: MyChart Message (if you have MyChart) OR A paper copy in the mail If you have any lab test that is abnormal or we need to change your treatment, we will call you to review the results.  Testing/Procedures: Your physician has recommended that you have a home sleep study. This test records several body functions during sleep, including: brain activity, eye movement, oxygen and carbon dioxide blood levels, heart rate and rhythm, breathing rate and rhythm, the flow of air through your mouth and nose, snoring, body muscle movements, and chest and belly movement.   Your physician has requested that you have an echocardiogram in December 2026. Echocardiography is a painless test that uses sound waves to create images of your heart. It provides your doctor with information about the size and shape of your heart and how well your heart's chambers and valves are working. This procedure takes approximately one hour. There are no restrictions for this procedure. Please do NOT wear cologne, perfume, aftershave, or lotions (deodorant is allowed). Please arrive 15 minutes prior to your appointment time.  Please note: We ask at that you not bring children with you during ultrasound (echo/ vascular) testing. Due to room size and safety concerns, children are not allowed in the ultrasound rooms during exams. Our front office staff cannot provide observation of children in our lobby area while testing is being conducted. An adult accompanying a patient to their appointment will only be allowed in the  ultrasound room at the discretion of the ultrasound technician under special circumstances. We apologize for any inconvenience.  Follow-Up: At [facility], you and your health needs are our priority.  As part of our continuing mission to provide you with exceptional heart care, we have created designated Provider Care Teams.  These Care Teams include your primary Cardiologist (physician) and Advanced Practice Providers (APPs -  Physician Assistants and Nurse Practitioners) who all work together to provide you with the care you need, when you need it.  We recommend signing up for the patient portal called "MyChart".  Sign up information is provided on this After Visit Summary.  MyChart is used to connect with patients for Virtual Visits (Telemedicine).  Patients are able to view lab/test results, encounter notes, upcoming appointments, etc.  Non-urgent messages can be sent to your provider as well.   To learn more about what you can do with MyChart, go to [email or web address].    Your next appointment:   1 year(s)  The format for your next appointment:   In Person  Provider:   Armanda Magic, MD {  Other Instructions   1st Floor: - Lobby - Registration  - Pharmacy  - Lab - Cafe  2nd Floor: - PV Lab - Diagnostic Testing (echo, CT, nuclear med)  3rd Floor: - Vacant  4th Floor: - TCTS (cardiothoracic surgery) - AFib Clinic - Structural Heart Clinic - Vascular Surgery  - Vascular Ultrasound  5th Floor: - [facility]Care Cardiology (general and EP) - Clinical Pharmacy for coumadin, hypertension, lipid, weight-loss medications, and med management appointments    Valet parking services will be available  as well.

## 2023-09-28 NOTE — Addendum Note (Signed)
 Addended by: [name] on: 09/28/2023 09:17 AM   Modules accepted: Orders

## 2023-09-28 NOTE — Progress Notes (Signed)
 Cardiology Office Note   Date:  09/28/2023   ID:  [name], [name] [birth year], MRN [id number]  PCP:  Marcine Matar, MD    No chief complaint on file.     History of Present Illness: [name] is a 73 y.o. 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 Cornelius Moras in 2012.  She has a history of HTN, DM, dyslipidemia and TIA.  Her last cath showed severe 2 vessel CAD with patent SVG>PDA and SVG>D2 with normal LVF.    She was admitted to [facility] a few weeks ago due to severe fatigue and flu like symptoms/AKI/UTI and anemia and was found to be very bradycardic.  Her Carvedilol was stopped and her bradycardia resolved. 2D echo at that time reported as normal LVF and normal valves.   She is here today for followup and is doing well.  She is feeling much better off the Carvedilol.  She denies any chest pain or pressure, SOB, DOE, PND, orthopnea,  dizziness, palpitations or syncope. She has been having a lot of LE edema. She is compliant with her meds and is tolerating meds with no SE.       Past Medical History:  Diagnosis Date   Abscess in epidural space of L2-L5 lumbar spine 03/2006   Anemia, iron deficiency    Arthritis    Breast cancer (HCC) 06/12/2017   right breast   CAD (coronary artery disease), native coronary artery 03/30/2015   Cataract    Colon polyp    a. Multiple colonic polyps status post colonoscopy in October 2007, consistent with tubular adenoma, tubulovillous adenoma with no high-grade dysplasia or malignancy identified.    Constipation    Coronary artery disease    a. s/p CABG 06/2010: S-D2; S-PDA (at time of MV surgery).   Depression    Diabetes mellitus    type II   Family history of breast cancer    Family history of prostate cancer    Genetic testing 09/25/2017   Multi-Cancer panel (83 genes) @ [facility] - No pathogenic  mutations detected   GERD (gastroesophageal reflux disease)    Hx of transient ischemic attack (TIA)    a. See stroke section.   Hyperlipidemia    Hypertension    Hypoxia    a. Has history of acute hypoxic respiratory failure in the setting of bronchitis/PNA or prior admissions.   MRSA infection    a. History of recurrent skin infection and soft tissue abscesses, with MRSA positive in the past.   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 mitral valve - s/p resection and MV repair 06/2010 Dr. Cornelius Moras.   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 Distal R MCA.   Stroke [facility])    a. 11/2009: mitral mass diagnosed at this time, also has distal R MCA stenosis, tx with coumadin. b. Readmitted 05/2010 with TIA symptoms - had not been taking Coumadin. s/p MV surgery 06/2010. Coumadin stopped 2013 after review of chart by Dr. Jens Som since mass was removed (stroke felt possibly related to this).     Past Surgical History:  Procedure Laterality Date   BREAST BIOPSY Right 08/26/2019   benign   BREAST LUMPECTOMY Right    BREAST LUMPECTOMY WITH RADIOACTIVE SEED AND SENTINEL LYMPH NODE BIOPSY Right 07/20/2017   Procedure: RIGHT BREAST LUMPECTOMY WITH RADIOACTIVE SEED AND SENTINEL LYMPH NODE BIOPSY;  Surgeon: Ovidio Kin, MD;  Location: [facility] OR;  Service: General;  Laterality: Right;   CARDIAC CATHETERIZATION  04/03/2015   Procedure: Left Heart Cath and Cors/Grafts Angiography;  Surgeon: Peter M Swaziland, MD;  Location: [facility] INVASIVE CV LAB;  Service: Cardiovascular;;   CORONARY ARTERY BYPASS GRAFT  1/12   history of ankle fractures requiring surgery     LUMBAR LAMINECTOMY/DECOMPRESSION MICRODISCECTOMY Left 04/15/2015   Procedure: LUMBAR LAMINECTOMY/DECOMPRESSION MICRODISCECTOMY 1 LEVEL;  Surgeon: Tressie Stalker, MD;  Location: [facility] NEURO ORS;  Service: Neurosurgery;  Laterality: Left;   Left L23 microdiskectomy   LUMBAR LAMINECTOMY/DECOMPRESSION MICRODISCECTOMY N/A 05/27/2020   Procedure: L2-3, L3-4  decompression;  Surgeon: Eldred Manges, MD;  Location: [facility] OR;  Service: Orthopedics;  Laterality: N/A;   MV repair and resection of mass  1/12   RESECTION DISTAL CLAVICAL Left 05/16/2019   Procedure: RESECTION DISTAL CLAVICAL;  Surgeon: Kathryne Hitch, MD;  Location: [facility];  Service: Orthopedics;  Laterality: Left;   SHOULDER ARTHROSCOPY WITH ROTATOR CUFF REPAIR AND SUBACROMIAL DECOMPRESSION Left 05/16/2019   Procedure: SHOULDER ARTHROSCOPY WITH ROTATOR CUFF REPAIR AND SUBACROMIAL DECOMPRESSION;  Surgeon: Kathryne Hitch, MD;  Location: [facility];  Service: Orthopedics;  Laterality: Left;   TONSILLECTOMY  1971   TOTAL ABDOMINAL HYSTERECTOMY  1978   TRIGGER FINGER RELEASE Right 12/02/2021   Procedure: RIGHT RING FINGER A-1 PULLEY RELEASE;  Surgeon: Kathryne Hitch, MD;  Location: [facility];  Service: Orthopedics;  Laterality: Right;   TRIGGER FINGER RELEASE Left 05/11/2023   Procedure: LEFT INDEX FINGER RELEASE TRIGGER FINGER;  Surgeon: Kathryne Hitch, MD;  Location: [facility];  Service: Orthopedics;  Laterality: Left;   VESICOVAGINAL FISTULA CLOSURE W/ TAH       Current Outpatient Medications  Medication Sig Dispense Refill   albuterol (VENTOLIN HFA) 108 (90 Base) MCG/ACT inhaler Inhale 2 puffs into the lungs every 6 (six) hours as needed for wheezing or shortness of breath. 1 each 0   amLODipine (NORVASC) 10 MG tablet TAKE 1 TABLET EVERY DAY 90 tablet 2   aspirin EC 81 MG tablet Take 1 tablet (81 mg total) by mouth daily. 30 tablet 5   atorvastatin (LIPITOR) 80 MG tablet TAKE 1 TABLET EVERY DAY. 90 tablet 1   buPROPion (WELLBUTRIN XL) 150 MG 24 hr tablet Take 1 tablet (150 mg total) by mouth 2 (two) times daily. 180 tablet 1   diclofenac Sodium (VOLTAREN) 1 % GEL APPLY 2 GRAMS  TOPICALLY 4 (FOUR) TIMES DAILY AS NEEDED. (Patient taking differently: Apply 2 g topically 4 (four) times daily as needed (for pain).) 100 g 0   docusate sodium (COLACE) 100 MG capsule Take 100 mg by mouth daily as needed for mild constipation.     DULoxetine (CYMBALTA) 30 MG capsule Take 1 capsule (30 mg total) by mouth daily. 180 capsule 1   FARXIGA 10 MG TABS tablet Take 10 mg by mouth daily with lunch.     FEROSUL 325 (65 Fe) MG tablet Take 325 mg by mouth daily with breakfast.     furosemide (LASIX) 20 MG tablet Take 20 mg by mouth in the morning.     gabapentin (NEURONTIN) 300 MG  capsule Take 1 capsule (300 mg total) by mouth 3 (three) times daily. 90 capsule 5   hydrALAZINE (APRESOLINE) 100 MG tablet Take 100 mg by mouth in the morning & at bedtime and 50 mg with lunch 135 tablet 1   insulin glargine (LANTUS) 100 UNIT/ML Solostar Pen Inject 10 Units into the skin at bedtime. 15 mL 3   Insulin Pen Needle (PEN NEEDLES) 31G X 8 MM MISC UAD 100 each 6   letrozole (FEMARA) 2.5 MG tablet Take 1 tablet (2.5 mg total) by mouth daily with lunch. 90 tablet 3   methocarbamol (ROBAXIN) 500 MG tablet TAKE 1 TABLET EVERY 8 HOURS AS NEEDED FOR MUSCLE SPASMS. (Patient taking differently: Take 500 mg by mouth every 8 (eight) hours as needed for muscle spasms.) 270 tablet 0   Multiple Vitamin (MULTIVITAMIN WITH MINERALS) TABS tablet Take 1 tablet by mouth daily with breakfast.     pantoprazole (PROTONIX) 40 MG tablet Take 40 mg by mouth daily.     polyethylene glycol powder (GLYCOLAX/MIRALAX) 17 GM/SCOOP powder Take 17 g by mouth daily as needed for mild constipation.     traMADol (ULTRAM) 50 MG tablet Take 1 tablet (50 mg total) by mouth every 12 (twelve) hours as needed. (Patient taking differently: Take 50 mg by mouth every 12 (twelve) hours as needed (for pain).) 40 tablet 0   traZODone (DESYREL) 50 MG tablet Take 1 tablet (50 mg total) by mouth at bedtime as needed. for sleep 90 tablet 1   valsartan  (DIOVAN) 160 MG tablet Take 160 mg by mouth 2 (two) times daily.     No current facility-administered medications for this visit.    Allergies:   Patient has no known allergies.    Social History:  The patient  reports that she quit smoking about 12 years ago. Her smoking use included cigarettes. She has never been exposed to tobacco smoke. She has never used smokeless tobacco. She reports current alcohol use. She reports that she does not currently use drugs after having used the following drugs: Marijuana.   Family History:  The patient's family history includes Breast cancer in her cousin; Cancer (age of onset: 37) in her brother; Cirrhosis in her sister; Colon cancer in her mother; Diabetes in her brother; Irritable bowel syndrome in her mother; Kidney disease in her brother; Prostate cancer in her father; Prostate cancer (age of onset: 68) in her brother; Stroke in her brother.    ROS:  Please see the history of present illness.   Otherwise, review of systems are positive for none.   All other systems are reviewed and negative.    PHYSICAL EXAM: VS: 160/77mmHg and HR 68 GEN: Well nourished, well developed in no acute distress HEENT: Normal NECK: No JVD; No carotid bruits LYMPHATICS: No lymphadenopathy CARDIAC:RRR, no murmurs, rubs, gallops RESPIRATORY:  Clear to auscultation without rales, wheezing or rhonchi  ABDOMEN: Soft, non-tender, non-distended MUSCULOSKELETAL:  No edema; No deformity  SKIN: Warm and dry NEUROLOGIC:  Alert and oriented x 3 PSYCHIATRIC:  Normal affect    Recent Labs: 06/01/2023: ALT 13 06/28/2023: B Natriuretic Peptide 225.9 06/29/2023: BUN 26; Creatinine, Ser 1.53; Magnesium 2.5; Potassium 4.3; Sodium 133 07/17/2023: Hemoglobin 9.8; Platelets 253    Lipid Panel    Component Value Date/Time   CHOL 204 (H) 11/07/2022 1606   TRIG 114 11/07/2022 1606   HDL 98 11/07/2022 1606   CHOLHDL 2.1 11/07/2022 1606   CHOLHDL 2.8 04/28/2016 1028   VLDL 19  04/28/2016  1028   LDLCALC 87 11/07/2022 1606   LDLDIRECT 101.7 07/18/2011 0901      Wt Readings from Last 3 Encounters:  09/28/23 266 lb 12.8 oz (121 kg)  07/17/23 264 lb (119.7 kg)  06/29/23 260 lb 2.3 oz (118 kg)      ASSESSMENT AND PLAN:  1. ASCAD  -s/p CABG with SVG to D2 and SVG to PDA with no angina.   -She has not had any chest pain or shortness of breath since I saw her last -Continue drug management with aspirin 81 mg daily and atorvastatin 80 mg daily with as needed refills -no BB due to hx of bradycardia  4.  Papillary fibroelastoma of the MV  -s/p remote resection and MV repair.  -2D echo 03/03/2020 showed normal LV function with EF 60 to 65% with grade 2 diastolic dysfunction and stable mitral valve repair with no significant stenosis or regurgitation -2D echo 06/16/2023 showed EF 60 to 65% with mild LVH, trivial MR and mild pulmonary hypertension  3.  HTN  -BP is poorly controlled on exam today>>she has not taken any of her BP meds this am -Continue prescription drug management with amlodipine 10 mg daily and valsartan 320 mg daily with as needed refills -increase Hydralazine to 100mg  TID -I have personally reviewed and interpreted outside labs performed by patient's PCP which showed serum creatinine 1.53 and potassium 4.3 on 06/29/2023 -Cannot use BB due to hx of severe bradycardia -encouraged her to be compliant in taking her BP meds first thing when she wakes up -followup with PharmD in 2 weeks for up titration of BP mneds  4.  Aortic stenosis -mild by echo in 2016 -echo 02-2020 showed no aortic stenosis -2D echo 06/16/2023 showed no aortic stenosis  5.  Hyperlipidemia -LDL goal less than 70 -Check FLP and ALT -Continue prescription drug management with atorvastatin 80 mg daily with as needed refills  6.  Pulmonary hypertension -Mild to moderate by 2D echo 06/16/2023 with PASP estimated at 40 mmHg -Repeat echo in 1 year -Will get a home sleep study to rule  out sleep apnea  Current medicines are reviewed at length with the patient today.  The patient does not have concerns regarding medicines.  The following changes have been made:  no change  Labs/ tests ordered today: See above Assessment and Plan No orders of the defined types were placed in this encounter.   Disposition:   FU in 6 months  Signed, Armanda Magic, MD  09/28/2023 9:09 AM    [facility] [address] [place], [place], [place]  [address] Phone: [phone]; Fax: [phone]

## 2023-09-29 ENCOUNTER — Other Ambulatory Visit: Payer: Self-pay | Admitting: Internal Medicine

## 2023-09-29 ENCOUNTER — Other Ambulatory Visit: Payer: Self-pay

## 2023-09-29 ENCOUNTER — Telehealth: Payer: Self-pay

## 2023-09-29 DIAGNOSIS — G8929 Other chronic pain: Secondary | ICD-10-CM

## 2023-09-29 LAB — LIPID PANEL
Chol/HDL Ratio: 1.8 ratio (ref 0.0–4.4)
Cholesterol, Total: 175 mg/dL (ref 100–199)
HDL: 100 mg/dL (ref >39)
LDL Chol Calc (NIH): 62 mg/dL (ref 0–99)
Triglycerides: 72 mg/dL (ref 0–149)
VLDL Cholesterol Cal: 13 mg/dL (ref 5–40)

## 2023-09-29 LAB — ALT: ALT: 12 IU/L (ref 0–32)

## 2023-09-29 NOTE — Patient Outreach (Signed)
 Care Management  Transitions of Care Program Transitions of Care Post-discharge week 2   09/29/2023 Name: FELITA BUMP MRN: [id number] DOB: 1951/05/20  Subjective: SUEANNE MANIACI is a 73 y.o. year old female who is a primary care patient of Marcine Matar, MD. The Care Management team Engaged with patient Engaged with patient by telephone to assess and address transitions of care needs.   Consent to Services:  Patient was given information about care management services, agreed to services, and gave verbal consent to participate.   Assessment:           SDOH Interventions    Flowsheet Row Office Visit from 07/17/2023 in [facility] [place] - A Dept Of [place]. [facility] ED to Hosp-Admission (Discharged) from 06/26/2023 in [facility]ICAL UNIT Telephone from 06/13/2023 in [place] [facility]ULATION [facility] DEPARTMENT Clinical Support from 01/24/2023 in [facility] [place] - A Dept Of [place]. [facility] Telephone from 06/28/2022 in [facility] Community Care Coordination Nutrition from 05/24/2021 in [facility] Nutr Diab Ed  - A Dept Of [place]. [facility]  SDOH Interventions        Food Insecurity Interventions Intervention Not Indicated Intervention Not Indicated, Inpatient TOC -- Intervention Not Indicated Intervention Not Indicated Intervention Not Indicated  Housing Interventions Intervention Not Indicated Intervention Not Indicated, Inpatient TOC Intervention Not Indicated Intervention Not Indicated -- --  Transportation Interventions Intervention Not Indicated Intervention Not Indicated, Inpatient TOC -- Intervention Not Indicated Intervention Not Indicated --  Utilities Interventions Intervention Not Indicated -- -- Intervention Not Indicated -- --  Alcohol Usage Interventions Intervention Not Indicated (Score <7) -- -- Intervention Not Indicated (Score <7) -- --  Financial Strain  Interventions Intervention Not Indicated -- -- Intervention Not Indicated -- --  Physical Activity Interventions [employer] -- -- Intervention Not Indicated -- --  Stress Interventions Intervention Not Indicated -- -- Intervention Not Indicated -- --  Social Connections Interventions Intervention Not Indicated -- -- Intervention Not Indicated -- --  Health Literacy Interventions Intervention Not Indicated -- -- Intervention Not Indicated -- --        Goals Addressed             This Visit's Progress    Transition of Care plan       Current Barriers:  Medication management due to recent admission to [facility] 3/17-3/21/25 for symptomatic bradycardia diagnosed due to Coreg (carvedilol), last dose 3/17, discontinued on admission and not resumed at discharge.  RNCM Clinical Goal(s):  Patient will work with the Care Management team over the next 30 days to address Transition of Care Barriers: Medication Management work with pharmacist to address possible need to adjust antihypertensive  related toHTN as evidenced by review or EMR and patient or pharmacist report through collaboration with RN Care manager, provider, and care team.   Interventions: Evaluation of current treatment plan related to  self management and patient's adherence to plan as established by provider   Hypertension Interventions:  (Status:  Ongoing) Short Term Goal Last practice recorded BP readings:  BP Readings from Last 3 Encounters:  08/15/23 134/65  07/17/23 (!) 148/71  06/29/23 (!) 161/68   **Most recent BPs taken pre-discharge from [facility] on 3/22 = 130/66 - 142/49. Discharging Hospitalist wrote "adjustments to antihypertensive medication regimen were made . . . . defer future adjustments to patient's  primary care physician"   Most  recent eGFR/CrCl:  Lab Results  Component Value Date   EGFR 38.0 10/04/2022    No components found for: "CRCL"  Evaluation of current  treatment plan related to hypertension self management and patient's adherence to plan as established by provider Reviewed medications with patient and discussed importance of compliance Discussed plans with patient for ongoing care management follow up and provided patient with direct contact information for care management team Advised patient, providing education and rationale, to monitor blood pressure daily and record, calling PCP for findings outside established parameters Reviewed scheduled/upcoming provider appointments including:  Advised patient to discuss recent hospitalization and adjustments made to medication regimen with CPP Keith Rake and  with provider Provided education on prescribed diet Low Sodium, Heart Healthy, Carbohydrate modified (diabetic) diet Screening for signs and symptoms of depression related to chronic disease state  Assessed social determinant of health barriers Patient followed up with her Cardiologist on 4/3 and had her cardiac meds adjusted again due to BP remaining high (180/80 in the office). Since being taken off Coreg, patient's heart rate has been stable (78 in office)  Patient Goals/Self-Care Activities: Participate in Transition of Care Program/Attend TOC scheduled calls Take all medications as prescribed Attend all scheduled provider appointments Call pharmacy for medication refills 3-7 days in advance of running out of medications Attend church or other social activities Perform all self care activities independently  Call provider office for new concerns or questions  keep a blood pressure log take medications for blood pressure exactly as prescribed begin an exercise program report new symptoms to your doctor eat more whole grains, fruits and vegetables, lean meats and healthy fats limit salt intake to 2300mg /day which is about the amount of sodium in one teaspoon of salt. Continue to utilize constant companion Research officer, political party" (patient's  beloved teacup Yorkie) as adjunct & supportive "pet therapy" to lower blood pressure  Follow Up Plan:  Telephone follow up appointment with care management team member scheduled for:  4/11 at 1pm. As we discussed, your new RNCM is Estanislado Emms and having had the pleasure & privilege of getting to know you over the last couple of months, I know you and [name] will be a great match to collaborate on your health & wellbeing. The patient has been provided with contact information for the care management team and has been advised to call with any health related questions or concerns.          Plan: The patient has been provided with contact information for the care management team and has been advised to call with any health related questions or concerns.   Kelen Laura A. Mliss Fritz RN, BA, [facility], CRRN [place]  [facility] Population Health RN Care Manager Direct Dial: [phone]  Fax: [phone]

## 2023-09-30 NOTE — Patient Instructions (Signed)
 Visit Information  Dear [name] & faithful furry companion "[name]",  Thank you for taking time to visit with me today. Please don't hesitate to contact me if I can be of assistance to you before your next scheduled telephone appointment.   As we discussed, your new RNCM is Estanislado Emms and having had the pleasure & privilege of getting to know you over the last couple of months, I know you and [name] will be a perfectly matched team to continue to collaborate on your health & wellbeing.  Your next appointment with Estanislado Emms is by telephone on October 06, 2023 at 1pm.  Warmest Regards,  Elnita Maxwell  Following is a copy of your care plan:   Goals Addressed             This Visit's Progress    Transition of Care plan       Current Barriers:  Medication management due to recent admission to [facility] 3/17-3/21/25 for symptomatic bradycardia diagnosed due to Coreg (carvedilol), last dose 3/17, discontinued on admission and not resumed at discharge.  RNCM Clinical Goal(s):  Patient will work with the Care Management team over the next 30 days to address Transition of Care Barriers: Medication Management work with pharmacist to address possible need to adjust antihypertensive  related toHTN as evidenced by review or EMR and patient or pharmacist report through collaboration with RN Care manager, provider, and care team.   Interventions: Evaluation of current treatment plan related to  self management and patient's adherence to plan as established by provider   Hypertension Interventions:  (Status:  Ongoing) Short Term Goal Last practice recorded BP readings:  BP Readings from Last 3 Encounters:  08/15/23 134/65  07/17/23 (!) 148/71  06/29/23 (!) 161/68   **Most recent BPs taken pre-discharge from [facility] on 3/22 = 130/66 - 142/49. Discharging Hospitalist wrote "adjustments to antihypertensive medication regimen were made . . . . defer future adjustments  to patient's  primary care physician"   Most recent eGFR/CrCl:  Lab Results  Component Value Date   EGFR 38.0 10/04/2022    No components found for: "CRCL"  Evaluation of current treatment plan related to hypertension self management and patient's adherence to plan as established by provider Reviewed medications with patient and discussed importance of compliance Discussed plans with patient for ongoing care management follow up and provided patient with direct contact information for care management team Advised patient, providing education and rationale, to monitor blood pressure daily and record, calling PCP for findings outside established parameters Reviewed scheduled/upcoming provider appointments including:  Advised patient to discuss recent hospitalization and adjustments made to medication regimen with CPP Keith Rake and  with provider Provided education on prescribed diet Low Sodium, Heart Healthy, Carbohydrate modified (diabetic) diet Screening for signs and symptoms of depression related to chronic disease state  Assessed social determinant of health barriers Patient followed up with her Cardiologist on 4/3 and had her cardiac meds adjusted again due to BP remaining high (180/80 in the office). Since being taken off Coreg, patient's heart rate has been stable (78 in office)  Patient Goals/Self-Care Activities: Participate in Transition of Care Program/Attend TOC scheduled calls Take all medications as prescribed Attend all scheduled provider appointments Call pharmacy for medication refills 3-7 days in advance of running out of medications Attend church or other social activities Perform all self care activities independently  Call provider office for new concerns or questions  keep a blood pressure log take  medications for blood pressure exactly as prescribed begin an exercise program report new symptoms to your doctor eat more whole grains, fruits and  vegetables, lean meats and healthy fats limit salt intake to 2300mg /day which is about the amount of sodium in one teaspoon of salt. Continue to utilize constant companion Research officer, political party" (patient's beloved teacup Yorkie) as adjunct & supportive "pet therapy" to lower blood pressure  Follow Up Plan:  Telephone follow up appointment with care management team member scheduled for:  4/11 at 1pm. As we discussed, your new RNCM is Estanislado Emms and having had the pleasure & privilege of getting to know you over the last couple of months, I know you and [name] will be a great match to collaborate on your health & wellbeing. The patient has been provided with contact information for the care management team and has been advised to call with any health related questions or concerns.          Patient verbalizes understanding of instructions and care plan provided today and agrees to view in MyChart. Active MyChart status and patient understanding of how to access instructions and care plan via MyChart confirmed with patient.     The patient has been provided with contact information for the care management team and has been advised to call with any health related questions or concerns.   Please call the care guide team at [phone] if you need to cancel or reschedule your appointment.   Please call 1-800-273-TALK (toll free, 24 hour hotline) if you are experiencing a Mental Health or Behavioral Health Crisis or need someone to talk to.  Vivaan Helseth A. Mliss Fritz RN, BA, [facility], CRRN [place]  [facility] Population Health RN Care Manager Direct Dial: [phone]  Fax: [phone]

## 2023-09-30 NOTE — Patient Outreach (Signed)
 09/29/2023  Mistaken duplicate telephone call entry  Townsend Cudworth A. Mliss Fritz RN, BA, [facility], CRRN [place]  [facility] Population Health RN Care Manager Direct Dial: [phone]  Fax: [phone]

## 2023-10-06 ENCOUNTER — Other Ambulatory Visit: Payer: Self-pay | Admitting: *Deleted

## 2023-10-06 ENCOUNTER — Telehealth: Payer: Self-pay | Admitting: *Deleted

## 2023-10-06 NOTE — Transitions of Care (Post Inpatient/ED Visit) (Signed)
 Care Management  Transitions of Care Program Transitions of Care Post-discharge week 3  10/06/2023 Name: NELISSA BOLDUC MRN: [id number] DOB: March 13, 1951  Subjective: [name] is a 73 y.o. year old female who is a primary care patient of Marcine Matar, MD. The Care Management team was unable to reach the patient by phone to assess and address transitions of care needs.   Plan: Additional outreach attempts will be made to reach the patient enrolled in the [facility] Program (Post Inpatient/ED Visit).  Estanislado Emms RN, BSN [place]  Value-Based Care Institute [facility] Health RN Care Manager [phone]

## 2023-10-06 NOTE — Transitions of Care (Post Inpatient/ED Visit) (Signed)
   10/06/2023  Name: [name] MRN: [id number] DOB: 09-Oct-1950   Patient returning call to [facility]. Patient was at a funeral today and missed telephone outreach from [facility] today. Patient request to reschedule. A new telephone appointment was made on 10/09/23 at 10am. Patient agreed to new date and time.  Estanislado Emms RN, BSN [place]  Value-Based Care Institute [facility] Health RN Care Manager [phone]

## 2023-10-09 ENCOUNTER — Other Ambulatory Visit: Payer: Self-pay | Admitting: *Deleted

## 2023-10-09 ENCOUNTER — Telehealth: Payer: Self-pay | Admitting: *Deleted

## 2023-10-09 NOTE — Patient Instructions (Signed)
 Visit Information  Thank you for taking time to visit with me today. Please don't hesitate to contact me if I can be of assistance to you before our next scheduled telephone appointment.  Our next appointment is by telephone on 10/18/23 at 11am  Following is a copy of your care plan:   Goals Addressed             This Visit's Progress    Transition of Care plan       Current Barriers:  Medication management due to recent admission to [facility] 3/17-3/21/25 for symptomatic bradycardia diagnosed due to Coreg (carvedilol), last dose 3/17, discontinued on admission and not resumed at discharge.  RNCM Clinical Goal(s):  Patient will work with the Care Management team over the next 30 days to address Transition of Care Barriers: Medication Management work with pharmacist to address possible need to adjust antihypertensive  related toHTN as evidenced by review or EMR and patient or pharmacist report through collaboration with RN Care manager, provider, and care team.   Interventions: Evaluation of current treatment plan related to  self management and patient's adherence to plan as established by provider   Hypertension Interventions:  (Status:  Ongoing) Short Term Goal Last practice recorded BP readings:  BP Readings from Last 3 Encounters:  09/28/23 (!) 180/80  08/15/23 134/65  07/17/23 (!) 148/71   **Most recent BPs taken pre-discharge from [facility] on 3/22 = 130/66 - 142/49. Discharging Hospitalist wrote "adjustments to antihypertensive medication regimen were made . . . . defer future adjustments to patient's  primary care physician"   Most recent eGFR/CrCl:  Lab Results  Component Value Date   EGFR 38.0 10/04/2022    No components found for: "CRCL"  Evaluation of current treatment plan related to hypertension self management and patient's adherence to plan as established by provider Reviewed medications with patient and discussed importance of  compliance Discussed plans with patient for ongoing care management follow up and provided patient with direct contact information for care management team Advised patient, providing education and rationale, to monitor blood pressure daily and record, calling PCP for findings outside established parameters Reviewed scheduled/upcoming provider appointments including:  Provided education on prescribed diet Low Sodium, Heart Healthy, Carbohydrate modified (diabetic) diet Discussed complications of poorly controlled blood pressure such as heart disease, stroke, circulatory complications, vision complications, kidney impairment, sexual dysfunction Screening for signs and symptoms of depression related to chronic disease state  Assessed social determinant of health barriers Advised patient to check BP today and write down readings, take readings to provider appointment on 10/10/23 Patient checking BP irregularly, has not checked BP today and unable to recall last reading Ensured patient has transportation to scheduled visit on 10/10/23   Patient Goals/Self-Care Activities: Participate in Transition of Care Program/Attend TOC scheduled calls Take all medications as prescribed Attend all scheduled provider appointments Call pharmacy for medication refills 3-7 days in advance of running out of medications Attend church or other social activities Perform all self care activities independently  Call provider office for new concerns or questions  keep a blood pressure log take medications for blood pressure exactly as prescribed begin an exercise program report new symptoms to your doctor eat more whole grains, fruits and vegetables, lean meats and healthy fats limit salt intake to 2300mg /day which is about the amount of sodium in one teaspoon of salt. Continue to utilize constant companion Research officer, political party" (patient's beloved teacup Yorkie) as adjunct & supportive "pet therapy" to lower  blood pressure  Follow  Up Plan:  Telephone follow up appointment with care management team member scheduled for:  10/18/23 at 11am          Patient verbalizes understanding of instructions and care plan provided today and agrees to view in MyChart. Active MyChart status and patient understanding of how to access instructions and care plan via MyChart confirmed with patient.     Telephone follow up appointment with care management team member scheduled for:  Please call the care guide team at [phone] if you need to cancel or reschedule your appointment.   Please call 1-800-273-TALK (toll free, 24 hour hotline) call 911 if you are experiencing a Mental Health or Behavioral Health Crisis or need someone to talk to.  Arna Better RN, BSN [place]  Value-Based Care Institute [facility] Health RN Care Manager [phone]

## 2023-10-09 NOTE — Transitions of Care (Post Inpatient/ED Visit) (Signed)
 Transition of Care week 3  Visit Note  10/09/2023  Name: [name] MRN: [id number]          DOB: [birth year]  Situation: Patient enrolled in [facility] 30-day program. Visit completed with Ms. Zwart by telephone.   Background:   Initial Transition Care Management Follow-up Telephone Call    Past Medical History:  Diagnosis Date   Abscess in epidural space of L2-L5 lumbar spine 03/2006   Anemia, iron deficiency    Arthritis    Breast cancer (HCC) 06/12/2017   right breast   CAD (coronary artery disease), native coronary artery 03/30/2015   Cataract    Colon polyp    a. Multiple colonic polyps status post colonoscopy in October 2007, consistent with tubular adenoma, tubulovillous adenoma with no high-grade dysplasia or malignancy identified.    Constipation    Coronary artery disease    a. s/p CABG 06/2010: S-D2; S-PDA (at time of MV surgery).   Depression    Diabetes mellitus    type II   Family history of breast cancer    Family history of prostate cancer    Genetic testing 09/25/2017   Multi-Cancer panel (83 genes) @ [facility] - No pathogenic mutations detected   GERD (gastroesophageal reflux disease)    Hx of transient ischemic attack (TIA)    a. See stroke section.   Hyperlipidemia    Hypertension    Hypoxia    a. Has history of acute hypoxic respiratory failure in the setting of bronchitis/PNA or prior admissions.   MRSA infection    a. History of recurrent skin infection and soft tissue abscesses, with MRSA positive in the past.   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 mitral valve - s/p resection and MV repair 06/2010 Dr. Cornelius Moras.   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 Distal R MCA.   Stroke [facility])    a. 11/2009: mitral mass diagnosed at this time, also has distal R MCA stenosis, tx with coumadin. b. Readmitted 05/2010 with TIA  symptoms - had not been taking Coumadin. s/p MV surgery 06/2010. Coumadin stopped 2013 after review of chart by Dr. Jens Som since mass was removed (stroke felt possibly related to this).     Assessment: Patient Reported Symptoms: Cognitive Cognitive Status: Alert and oriented to person, place, and time, Insightful and able to interpret abstract concepts, Normal speech and language skills   Health Maintenance Behaviors: Annual physical exam, Healthy diet Healing Pattern: Average Health Facilitated by: Healthy diet, Rest  Neurological  No symptoms reported    HEENT HEENT Symptoms Reported: No symptoms reported      Cardiovascular Cardiovascular Symptoms Reported: No symptoms reported Does patient have uncontrolled Hypertension?: Yes Is patient checking Blood Pressure at home?: Yes Patient's Recent BP reading at home: Patient checked BP two days ago, unable to recall reading Weight: 271 lb 9.6 oz (123.2 kg) (home scale) Cardiovascular Self-Management Outcome: 4 (good) Cardiovascular Comment: Patient reports feeling like a different person since discontinuing Carvediolol. She has more energy and feels so much better.  Respiratory Respiratory Symptoms Reported: No symptoms reported    Endocrine Patient reports the following symptoms related to hypoglycemia or hyperglycemia : No symptoms reported Is patient diabetic?: Yes Is patient checking blood sugars at home?: Yes Endocrine Conditions: Diabetes Endocrine Management Strategies: Medication therapy, Routine screening, Medical device,  Diet modification Endocrine Self-Management Outcome: 4 (good)  Gastrointestinal Gastrointestinal Symptoms Reported: No symptoms reported      Genitourinary    No symptoms reported  Integumentary Integumentary Symptoms Reported: No symptoms reported    Musculoskeletal Musculoskelatal Symptoms Reviewed: Difficulty walking Additional Musculoskeletal Details: Utilizes cane when outside of the  house Musculoskeletal Conditions: Joint pain Musculoskeletal Management Strategies: Routine screening, Medication therapy, Medical device Musculoskeletal Self-Management Outcome: 2 (bad) Musculoskeletal Comment: Patient planning to schedule with Orthopedic for knee and shoulder pain Falls in the past year?: No    Psychosocial Psychosocial Symptoms Reported: No symptoms reported         There were no vitals filed for this visit.  Medications Reviewed Today     Reviewed by Aura Leeds, RN (Registered Nurse) on 10/09/23 at 1027  Med List Status: <None>   Medication Order Taking? Sig Documenting Provider Last Dose Status Informant  albuterol (VENTOLIN HFA) 108 (90 Base) MCG/ACT inhaler [id number] Yes Inhale 2 puffs into the lungs every 6 (six) hours as needed for wheezing or shortness of breath. Lawrance Presume, MD 10/08/2023 Active Self  amLODipine (NORVASC) 10 MG tablet [id number] Yes TAKE 1 TABLET EVERY DAY Lawrance Presume, MD 10/08/2023 Active Self  aspirin EC 81 MG tablet [id number] Yes Take 1 tablet (81 mg total) by mouth daily. Pleas Brill, NP 10/08/2023 Active Self  atorvastatin (LIPITOR) 80 MG tablet [id number] Yes TAKE 1 TABLET EVERY DAY. Lawrance Presume, MD 10/08/2023 Active   buPROPion (WELLBUTRIN XL) 150 MG 24 hr tablet [id number] Yes Take 1 tablet (150 mg total) by mouth 2 (two) times daily. Lawrance Presume, MD 10/08/2023 Active   diclofenac Sodium (VOLTAREN) 1 % GEL [id number] No APPLY 2 GRAMS TOPICALLY 4 (FOUR) TIMES DAILY AS NEEDED.  Patient not taking: Reported on 10/09/2023   Lawrance Presume, MD Not Taking Active Self           Med Note (Delorise Hunkele A   Mon Oct 09, 2023 10:22 AM) Needs refill  docusate sodium (COLACE) 100 MG capsule [id number] Yes Take 100 mg by mouth daily as needed for mild constipation. [provider] Unknown Active Self  DULoxetine (CYMBALTA) 30 MG capsule [id number] Yes Take 1 capsule (30 mg total) by mouth daily. Phebe Brasil, MD  10/08/2023 Active Self  FARXIGA 10 MG TABS tablet [id number] Yes Take 10 mg by mouth daily with lunch. [provider] 10/08/2023 Active Self  FEROSUL 325 (65 Fe) MG tablet [id number] Yes Take 325 mg by mouth daily with breakfast. [provider] 10/08/2023 Active Self  furosemide (LASIX) 20 MG tablet [id number] Yes Take 20 mg by mouth in the morning. [provider] 10/08/2023 Active Self  gabapentin (NEURONTIN) 300 MG capsule [id number] Yes Take 1 capsule (300 mg total) by mouth 3 (three) times daily. Phebe Brasil, MD 10/08/2023 Active Self  hydrALAZINE (APRESOLINE) 100 MG tablet [id number] Yes Take 1 tablet (100 mg total) by mouth 3 (three) times daily. Jacqueline Matsu, MD 10/08/2023 Active   insulin glargine (LANTUS) 100 UNIT/ML Solostar Pen [id number] Yes Inject 10 Units into the skin at bedtime. Lawrance Presume, MD 10/08/2023 Active   Insulin Pen Needle (PEN NEEDLES) 31G X 8 MM MISC [id number] Yes UAD Lawrance Presume, MD Taking Active   letrozole (FEMARA) 2.5 MG tablet [id number] Yes Take 1 tablet (2.5 mg total) by mouth daily with lunch. Gudena, Vinay, MD 10/08/2023 Active   methocarbamol (ROBAXIN) 500 MG tablet [id number] Yes TAKE 1 TABLET EVERY 8  HOURS AS NEEDED FOR MUSCLE SPASMS. Lawrance Presume, MD 10/08/2023 Active   Multiple Vitamin (MULTIVITAMIN WITH MINERALS) TABS tablet [id number] Yes Take 1 tablet by mouth daily with breakfast. [provider] 10/08/2023 Active Self  pantoprazole (PROTONIX) 40 MG tablet [id number] Yes Take 40 mg by mouth daily. [provider] 10/08/2023 Active   polyethylene glycol powder (GLYCOLAX/MIRALAX) 17 GM/SCOOP powder [id number] No Take 17 g by mouth daily as needed for mild constipation.  Patient not taking: Reported on 10/09/2023   [provider] Not Taking Active Self  traMADol (ULTRAM) 50 MG tablet [id number] Yes Take 1 tablet (50 mg total) by mouth every 12 (twelve) hours as needed.  Patient taking differently: Take  50 mg by mouth every 12 (twelve) hours as needed (for pain).   Lawrance Presume, MD Unknown Active Self  traZODone (DESYREL) 50 MG tablet [id number] Yes Take 1 tablet (50 mg total) by mouth at bedtime as needed. for sleep Lawrance Presume, MD 10/08/2023 Active            Med Note Saverio Curling, CHERYL A   Tue Sep 19, 2023  5:34 PM)    valsartan (DIOVAN) 160 MG tablet [id number] Yes Take 160 mg by mouth 2 (two) times daily. [provider] 10/08/2023 Active             Recommendation:   Advised patient to follow up with Orthopedic for shoulder and knee pain  Follow Up Plan:   Telephone follow-up in 1 week  Arna Better RN, BSN [place]  Value-Based Care Institute [facility] Health RN Care Manager [phone]

## 2023-10-09 NOTE — Transitions of Care (Post Inpatient/ED Visit) (Signed)
 Care Management  Transitions of Care Program Transitions of Care Post-discharge week 3  10/09/2023 Name: [name] MRN: [id number] DOB: 13-Jun-1951  Subjective: [name] is a 73 y.o. year old female who is a primary care patient of Lawrance Presume, MD. The Care Management team was unable to reach the patient by phone to assess and address transitions of care needs.   Plan: Additional outreach attempts will be made to reach the patient enrolled in the [facility] Program (Post Inpatient/ED Visit).  Arna Better RN, BSN [place]  Value-Based Care Institute [facility] Health RN Care Manager [phone]

## 2023-10-10 ENCOUNTER — Other Ambulatory Visit: Payer: Self-pay

## 2023-10-10 ENCOUNTER — Ambulatory Visit: Attending: Internal Medicine | Admitting: Pharmacist

## 2023-10-10 ENCOUNTER — Encounter: Payer: Self-pay | Admitting: Pharmacist

## 2023-10-10 VITALS — BP 150/72 | HR 80

## 2023-10-10 DIAGNOSIS — I10 Essential (primary) hypertension: Secondary | ICD-10-CM | POA: Diagnosis not present

## 2023-10-10 NOTE — Progress Notes (Cosign Needed)
    S:     No chief complaint on file.  73 y.o. female who presents for diabetes evaluation, education, and management. Patient arrives in good spirits and presents without  any assistance.   Patient was referred and last seen by Primary Care Provider, Dr. Laural Benes, on 07/17/23. BP was 148/71, however, she had not taken her antihypertensives prior to that appt. I saw her 08/15/2023 and BP was 134/65 mmHg. We did not make any changes.  PMH is significant for  HTN, DM, HL, depression, chronic back pain, RT breast CA (intraductal CA, ER/PR+, lumpectomy and XRT - completed 08/2017), CKD stage 3 (recent AKI during hospitalization 09/12/23), CVA 2011 (residual weakness on LT side), CAD s/p CABG and MVR/resection of fibroelastoma, IDA. [name]   Today, she reports doing much better since her hospitalization last month. Briefly, she was admitted to [facility] 3/18-3/21/2025 with symptomatic bradycardia and AKI. Hospital team stopped patient's carvedilol and her symptoms improved. Admit creatinine was 2.4, down to 1.6 (near baseline on discharge). Since that visit, patient was seen by Cardiology 09/28/2023 with an offive BP of 180/80. Her valsartan was increased to 160 mg BID and her hydralazine to 100 mg TID. Carvedilol was not restarted.   Family/Social History:  -Fhx: colon cancer, IBS, prostate cancer, cirrhosis, stroke, prostate cancer, diabetes, kidney disease, cancer -Smoking: none  Current hypertension medications include:  -amlodipine 10 mg daily -hydralazine 100 mg TID -valsartan 160 mg BID -furosemide 20 mg daily  Insurance coverage: [employer]  Patient reported dietary habits: none reported  Patient-reported exercise habits: walking with help from therapist.  O:  Lab Results  Component Value Date   HGBA1C 8.5 (H) 06/27/2023   There were no vitals filed for this visit.  Lipid Panel     Component Value Date/Time   CHOL 175 09/28/2023 1009    TRIG 72 09/28/2023 1009   HDL 100 09/28/2023 1009   CHOLHDL 1.8 09/28/2023 1009   CHOLHDL 2.8 04/28/2016 1028   VLDL 19 04/28/2016 1028   LDLCALC 62 09/28/2023 1009   LDLDIRECT 101.7 07/18/2011 0901    Clinical Atherosclerotic Cardiovascular Disease (ASCVD): Yes  The ASCVD Risk score (Arnett DK, et al., 2019) failed to calculate for the following reasons:   Risk score cannot be calculated because patient has a medical history suggesting prior/existing ASCVD   A/P: Hypertension longstanding currently uncontrolled. Blood pressure goal of <130/80 mmHg. Medication adherence reported. BP today is above goal but improved since seeing Cardiology earlier this month. Will hold off on additional changes and get labs today.   -Continued amlodipine 10 mg daily -Continued hydralazine 100 mg TID -Continued valsartan 160 mg daily -Continued furosemide 20 mg daily -F/u labs ordered - CMP14+eGFR -Counseled on lifestyle modifications for blood pressure control including reduced dietary sodium, increased exercise, adequate sleep. -Encouraged patient to check BP at home and bring log of readings to next visit. Counseled on proper use of home BP cuff.   Results reviewed and written information provided.    Written patient instructions provided. Patient verbalized understanding of treatment plan.  Total time in face to face counseling 20 minutes.    Follow-up:  Pharmacist prn. PCP clinic visit 11/14/2023.   Butch Penny, PharmD, Patsy Baltimore, CPP Clinical Pharmacist [facility] [phone]

## 2023-10-11 ENCOUNTER — Other Ambulatory Visit: Payer: Self-pay

## 2023-10-11 ENCOUNTER — Telehealth: Payer: Self-pay | Admitting: Internal Medicine

## 2023-10-11 DIAGNOSIS — N184 Chronic kidney disease, stage 4 (severe): Secondary | ICD-10-CM

## 2023-10-11 DIAGNOSIS — E875 Hyperkalemia: Secondary | ICD-10-CM

## 2023-10-11 LAB — CMP14+EGFR
ALT: 12 IU/L (ref 0–32)
AST: 17 IU/L (ref 0–40)
Albumin: 4.2 g/dL (ref 3.8–4.8)
Alkaline Phosphatase: 72 IU/L (ref 44–121)
BUN/Creatinine Ratio: 14 (ref 12–28)
BUN: 27 mg/dL (ref 8–27)
Bilirubin Total: 0.3 mg/dL (ref 0.0–1.2)
CO2: 20 mmol/L (ref 20–29)
Calcium: 9.2 mg/dL (ref 8.7–10.3)
Chloride: 107 mmol/L — ABNORMAL HIGH (ref 96–106)
Creatinine, Ser: 1.94 mg/dL — ABNORMAL HIGH (ref 0.57–1.00)
Globulin, Total: 2.2 g/dL (ref 1.5–4.5)
Glucose: 104 mg/dL — ABNORMAL HIGH (ref 70–99)
Potassium: 6.2 mmol/L — ABNORMAL HIGH (ref 3.5–5.2)
Sodium: 141 mmol/L (ref 134–144)
Total Protein: 6.4 g/dL (ref 6.0–8.5)
eGFR: 27 mL/min/{1.73_m2} — ABNORMAL LOW (ref >59)

## 2023-10-11 MED ORDER — SEMAGLUTIDE(0.25 OR 0.5MG/DOS) 2 MG/3ML ~~LOC~~ SOPN
0.2500 mg | PEN_INJECTOR | SUBCUTANEOUS | 1 refills | Status: DC
Start: 2023-10-11 — End: 2023-11-24
  Filled 2023-10-11 – 2023-10-12 (×2): qty 3, 28d supply, fill #0
  Filled 2023-10-13: qty 3, 56d supply, fill #0

## 2023-10-11 MED ORDER — GABAPENTIN 300 MG PO CAPS: 300.0000 mg | ORAL_CAPSULE | Freq: Three times a day (TID) | ORAL | Status: DC

## 2023-10-11 MED ORDER — DAPAGLIFLOZIN PROPANEDIOL 5 MG PO TABS: 5.0000 mg | ORAL_TABLET | Freq: Every day | ORAL | Status: DC

## 2023-10-11 MED ORDER — VALSARTAN 160 MG PO TABS: 160.0000 mg | ORAL_TABLET | Freq: Every day | ORAL | Status: DC

## 2023-10-11 MED ORDER — LOKELMA 5 G PO PACK
PACK | ORAL | 0 refills | Status: DC
Start: 2023-10-11 — End: 2023-10-29

## 2023-10-11 NOTE — Telephone Encounter (Signed)
 PC placed to pt today to discuss BMP results. Patient with decrease in GFR to 27 and creatinine elevated at 1.94.  Calcium level elevated at 6.2.  I note that she was hospitalized at [facility] in [place] last month with bradycardia.  Carvedilol was discontinued.  Valsartan was increased from 160 mg daily to twice a day.  She is also on Farxiga 10 mg daily.  Last GFR and creatinine prior to hospital discharge was done on 09/14/2023.  Creatinine was 1.6 and GFR was 33. Saw cardiology Dr. Mayford Knife on the third of this month.  Hydralazine was increased from 100 mg in the morning/50 mg in the evening to 100 mg 3 times a day.  Inform patient that this  elevated potassium level can cause heart to go into abnormal rhythm.  She denies any palpitations at this time.  I recommend the following: - Hold off on taking valsartan today.  Starting tomorrow decrease frequency to once a day. - Hold off on taking Farxiga 10 mg today.  Starting tomorrow, decrease to half a tablet daily. - Decrease gabapentin from 300 mg 3 times a day to 300 mg twice a day. - I will send prescription for [facility] to her [facility] pharmacy in [facility]  Advised to pick it up today and take it.  You - Return to the lab tomorrow afternoon for repeat chemistry check - Advised patient to call her nephrologist Dr. Valentino Nose and schedule a f/u appt ASAP due to worsening kidney function.  She tells me she missed appointment with him last month because she was hospitalized at the time Patient wrote down the information and was able to repeat instructions back to me.  Advised that if she develops any feeling of heart racing, she should be seen in the emergency room immediately.  Patient then told me that after I saw her in January and prescribed the Ozempic, she was not able to get it due to co-pay of $140 a month.  States that she spoke with her caseworker who sent her a form for patient assistance program which she misplaced.  She saw our  clinical pharmacist yesterday and he recommended that she go downstairs to our pharmacy to fill out form for patient assistance program but states she was told she would need the prescription to be sent by me.  I told her that I will send a prescription today. Results for orders placed or performed in visit on 10/10/23  CMP14+EGFR   Collection Time: 10/10/23 10:30 AM  Result Value Ref Range   Glucose 104 (H) 70 - 99 mg/dL   BUN 27 8 - 27 mg/dL   Creatinine, Ser 6.21 (H) 0.57 - 1.00 mg/dL   eGFR 27 (L) >30 QM/VHQ/4.69   BUN/Creatinine Ratio 14 12 - 28   Sodium 141 134 - 144 mmol/L   Potassium 6.2 (H) 3.5 - 5.2 mmol/L   Chloride 107 (H) 96 - 106 mmol/L   CO2 20 20 - 29 mmol/L   Calcium 9.2 8.7 - 10.3 mg/dL   Total Protein 6.4 6.0 - 8.5 g/dL   Albumin 4.2 3.8 - 4.8 g/dL   Globulin, Total 2.2 1.5 - 4.5 g/dL   Bilirubin Total 0.3 0.0 - 1.2 mg/dL   Alkaline Phosphatase 72 44 - 121 IU/L   AST 17 0 - 40 IU/L   ALT 12 0 - 32 IU/L

## 2023-10-12 ENCOUNTER — Other Ambulatory Visit: Payer: Self-pay

## 2023-10-12 ENCOUNTER — Ambulatory Visit: Attending: Internal Medicine

## 2023-10-12 DIAGNOSIS — E875 Hyperkalemia: Secondary | ICD-10-CM

## 2023-10-13 ENCOUNTER — Telehealth: Payer: Self-pay | Admitting: Internal Medicine

## 2023-10-13 ENCOUNTER — Other Ambulatory Visit: Payer: Self-pay

## 2023-10-13 ENCOUNTER — Encounter: Payer: Self-pay | Admitting: Internal Medicine

## 2023-10-13 DIAGNOSIS — E875 Hyperkalemia: Secondary | ICD-10-CM

## 2023-10-13 LAB — BASIC METABOLIC PANEL WITH GFR
BUN/Creatinine Ratio: 16 (ref 12–28)
BUN: 26 mg/dL (ref 8–27)
CO2: 18 mmol/L — ABNORMAL LOW (ref 20–29)
Calcium: 9.4 mg/dL (ref 8.7–10.3)
Chloride: 105 mmol/L (ref 96–106)
Creatinine, Ser: 1.63 mg/dL — ABNORMAL HIGH (ref 0.57–1.00)
Glucose: 132 mg/dL — ABNORMAL HIGH (ref 70–99)
Potassium: 5.4 mmol/L — ABNORMAL HIGH (ref 3.5–5.2)
Sodium: 140 mmol/L (ref 134–144)
eGFR: 33 mL/min/{1.73_m2} — ABNORMAL LOW (ref >59)

## 2023-10-13 NOTE — Telephone Encounter (Signed)
 Phone call placed to patient this morning.  I left a message on her voicemail informing of who I am and that I was calling to go over her lab results.  I left the following message: Your potassium level has come down but still mildly elevated.  I recommend cutting back on potassium rich foods like bananas, oranges and orange juice.  I also recommend cutting the dose of the valsartan  in half.  We left you on valsartan  160 mg once a day.  I now want you to do half a tablet once a day.  Please return to the lab on Wednesday or Thursday of next week to have potassium level rechecked. Message also sent to pt via Mychart.

## 2023-10-17 ENCOUNTER — Inpatient Hospital Stay: Payer: Medicare PPO | Attending: Adult Health | Admitting: Adult Health

## 2023-10-17 ENCOUNTER — Other Ambulatory Visit: Payer: Self-pay

## 2023-10-17 VITALS — BP 146/53 | HR 86 | Temp 98.5°F | Resp 18 | Ht 68.0 in | Wt 267.0 lb

## 2023-10-17 DIAGNOSIS — C50411 Malignant neoplasm of upper-outer quadrant of right female breast: Secondary | ICD-10-CM | POA: Diagnosis not present

## 2023-10-17 DIAGNOSIS — Z87891 Personal history of nicotine dependence: Secondary | ICD-10-CM | POA: Insufficient documentation

## 2023-10-17 DIAGNOSIS — Z79811 Long term (current) use of aromatase inhibitors: Secondary | ICD-10-CM | POA: Diagnosis not present

## 2023-10-17 DIAGNOSIS — Z170 Estrogen receptor positive status [ER+]: Secondary | ICD-10-CM | POA: Diagnosis not present

## 2023-10-17 NOTE — Progress Notes (Signed)
 [facility] Cancer Follow up:    Stacy Presume, MD [address] [place] [address]   DIAGNOSIS:  Cancer Staging  Malignant neoplasm of upper-outer quadrant of right breast in female, estrogen receptor positive (HCC) Staging form: Breast, AJCC 8th Edition - Clinical: Stage IA (cT1c, cN0, cM0, G2, ER+, PR+, HER2-) - Unsigned Histologic grading system: 3 grade system - Pathologic: Stage IA (pT1c, pN0, cM0, G2, ER+, PR+, HER2-) - Unsigned Histologic grading system: 3 grade system    SUMMARY OF ONCOLOGIC HISTORY: Oncology History  Malignant neoplasm of upper-outer quadrant of right breast in female, estrogen receptor positive (HCC)  06/12/2017 Initial Diagnosis   Right breast biopsy 10:00: IDC grade 2, ER 95%, PR 30%, Ki-67 15%, HER-2 negative ratio 1.36, 1.7 cm mass in the right upper outer quadrant, T1CN0 stage I a clinical stage   07/21/2017 Surgery   Rt Lumpectomy: Grade 2 IDC 1.7 cm, 0/2 LN Neg, Margins Neg, ER 95%, PR 30%, Ki-67 15%, HER-2 negative ratio 1.36 T1cN0 Stage 1A   08/22/2017 - 09/15/2017 Radiation Therapy   Adjuvant radiation therapy   08/23/2017 Oncotype testing   Oncotype DX recurrence score 14: Risk of distant recurrence of 9 years: 4%, low risk   10/19/2017 -  Anti-estrogen oral therapy   Letrozole  daily     CURRENT THERAPY: Letrozole   INTERVAL HISTORY:  Discussed the use of AI scribe software for clinical note transcription with the patient, who gave verbal consent to proceed.  [name] 73 y.o. female with a history of breast cancer, ischemic bowel disease, and trigger finger, presents for a follow-up after a recent hospitalization. She had a series of health issues including pneumonia, hand surgery for trigger finger, and bowel surgery. Following these events, she experienced a significant decrease in energy and restlessness. She was admitted to the hospital with a heart rate of 20 and blood pressure in the 20s. The  patient attributes her survival to divine intervention and the cessation of carvedilol , which she believes was causing her symptoms. She reports feeling like a new person since stopping the medication.  The patient has been on letrozole  for her breast cancer for nearly seven years and is considering stopping the medication next year. She reports some coughing but manages it with an inhaler. She has limited mobility due to knee issues and is scheduled to see an orthopedist. She also reports back issues following a previous surgery.   Patient Active Problem List   Diagnosis Date Noted   Pneumonia 06/26/2023   Colonic mass 06/08/2023   Chronic right shoulder pain 03/28/2023   Neck pain 03/28/2023   Primary osteoarthritis of right shoulder 08/10/2022   CAP (community acquired pneumonia) 06/22/2022   Stage 3b chronic kidney disease (CKD) (HCC) 06/22/2022   Trigger finger, left index finger 12/02/2021   DM type 2 with diabetic peripheral neuropathy (HCC) 07/26/2021   Bilateral low back pain with bilateral sciatica 07/26/2021   Gait abnormality 07/26/2021   Controlled substance agreement signed 12/31/2020   Shoulder stiffness, left 07/07/2020   Status post lumbar spine surgery for decompression of spinal cord 06/09/2020   Lumbar stenosis 05/27/2020   Spinal stenosis of lumbar region 05/21/2019   Complete tear of left rotator cuff 05/16/2019   Uncontrolled type 2 diabetes mellitus with hyperglycemia (HCC) 12/19/2018   Microalbuminuria 04/08/2018   Genetic testing 09/25/2017   Family history of breast cancer    Family history of prostate cancer    Malignant neoplasm  of upper-outer quadrant of right breast in female, estrogen receptor positive (HCC) 06/26/2017   Lumbar herniated disc 04/15/2015   Abnormal nuclear stress test 04/03/2015   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  MEDICAL HISTORY: Past Medical History:  Diagnosis Date   Abscess in epidural space of L2-L5 lumbar spine 03/2006   Anemia, iron  deficiency    Arthritis    Breast cancer (HCC) 06/12/2017   right breast   CAD (coronary artery disease), native coronary artery 03/30/2015   Cataract    Colon polyp    a. Multiple colonic polyps status post colonoscopy in October 2007, consistent with tubular adenoma, tubulovillous adenoma with no high-grade dysplasia or malignancy identified.    Constipation    Coronary artery disease    a. s/p CABG 06/2010: S-D2; S-PDA (at time of MV surgery).   Depression    Diabetes mellitus    type II   Family history of breast cancer    Family history of prostate cancer    Genetic testing 09/25/2017   Multi-Cancer panel (83 genes) @ [facility] - No pathogenic mutations detected   GERD (gastroesophageal reflux disease)    Hx of transient ischemic attack (TIA)    a. See stroke section.   Hyperlipidemia    Hypertension    Hypoxia    a. Has history of acute hypoxic respiratory failure in the setting of bronchitis/PNA or prior admissions.   MRSA infection    a. History of recurrent skin infection and soft tissue abscesses, with MRSA positive in the past.   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 mitral valve - s/p resection and MV repair 06/2010 Dr. Alva Jewels.   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 Distal R MCA.   Stroke [facility])    a. 11/2009: mitral mass diagnosed at this time, also has distal R MCA stenosis, tx with coumadin . b. Readmitted 05/2010 with TIA symptoms - had not been taking Coumadin . s/p MV surgery 06/2010. Coumadin  stopped 2013 after review of chart by Dr. Audery Greene since mass was removed (stroke felt  possibly related to this).     SURGICAL HISTORY: Past Surgical History:  Procedure Laterality Date   BREAST BIOPSY Right 08/26/2019   benign   BREAST LUMPECTOMY Right    BREAST LUMPECTOMY WITH RADIOACTIVE SEED AND SENTINEL LYMPH NODE BIOPSY Right 07/20/2017   Procedure: RIGHT BREAST LUMPECTOMY WITH RADIOACTIVE SEED AND SENTINEL LYMPH NODE BIOPSY;  Surgeon: Juanita Norlander, MD;  Location: [facility] OR;  Service: General;  Laterality: Right;   CARDIAC CATHETERIZATION  04/03/2015   Procedure: Left Heart Cath and Cors/Grafts Angiography;  Surgeon: Peter M Swaziland, MD;  Location: [facility] INVASIVE CV LAB;  Service: Cardiovascular;;   CORONARY ARTERY BYPASS GRAFT  1/12   history of ankle fractures requiring surgery     LUMBAR LAMINECTOMY/DECOMPRESSION MICRODISCECTOMY Left 04/15/2015   Procedure: LUMBAR LAMINECTOMY/DECOMPRESSION MICRODISCECTOMY 1 LEVEL;  Surgeon: Garry Kansas, MD;  Location: [facility] NEURO ORS;  Service: Neurosurgery;  Laterality: Left;  Left L23 microdiskectomy   LUMBAR LAMINECTOMY/DECOMPRESSION MICRODISCECTOMY N/A 05/27/2020   Procedure: L2-3, L3-4  decompression;  Surgeon: Adah Acron,  MD;  Location: [facility] OR;  Service: Orthopedics;  Laterality: N/A;   MV repair and resection of mass  1/12   RESECTION DISTAL CLAVICAL Left 05/16/2019   Procedure: RESECTION DISTAL CLAVICAL;  Surgeon: Arnie Lao, MD;  Location: [facility];  Service: Orthopedics;  Laterality: Left;   SHOULDER ARTHROSCOPY WITH ROTATOR CUFF REPAIR AND SUBACROMIAL DECOMPRESSION Left 05/16/2019   Procedure: SHOULDER ARTHROSCOPY WITH ROTATOR CUFF REPAIR AND SUBACROMIAL DECOMPRESSION;  Surgeon: Arnie Lao, MD;  Location: [facility];  Service: Orthopedics;  Laterality: Left;   TONSILLECTOMY  1971   TOTAL ABDOMINAL HYSTERECTOMY  1978   TRIGGER FINGER RELEASE Right 12/02/2021   Procedure: RIGHT RING FINGER A-1 PULLEY RELEASE;  Surgeon: Arnie Lao, MD;  Location: [facility];  Service: Orthopedics;  Laterality: Right;   TRIGGER FINGER RELEASE Left 05/11/2023   Procedure: LEFT INDEX FINGER RELEASE TRIGGER FINGER;  Surgeon: Arnie Lao, MD;  Location: [facility];  Service: Orthopedics;  Laterality: Left;   VESICOVAGINAL FISTULA CLOSURE W/ TAH      SOCIAL HISTORY: Social History   Socioeconomic History   Marital status: Married    Spouse name: Not on file   Number of children: 3   Years of education: some college   Highest education level: Not on file  Occupational History    Employer: UNEMPLOYED  Tobacco Use   Smoking status: Former    Current packs/day: 0.00    Types: Cigarettes    Quit date: 10/14/2010    Years since quitting: 13.0    Passive exposure: Never   Smokeless tobacco: Never  Vaping Use   Vaping status: Never Used  Substance and Sexual Activity   Alcohol use: Yes    Comment: social   Drug use: Not Currently    Types: Marijuana   Sexual activity: Not Currently  Other Topics Concern   Not on file  Social History Narrative   Lives with husband, stay at home, uses cane occasionally, still active/ambulatory.    Left-handed.   No daily caffeine use.   Social Drivers of Corporate investment banker Strain: Low Risk  (09/29/2023)   Overall Financial Resource Strain (CARDIA)    Difficulty of Paying Living Expenses: Not hard at all  Food Insecurity: No Food Insecurity (09/29/2023)   Hunger Vital Sign    Worried About Running Out of Food in the Last Year: Never true    Ran Out of Food in the Last Year: Never true  Transportation Needs: No Transportation Needs (10/09/2023)   PRAPARE - Administrator, Civil Service (Medical): No    Lack of Transportation (Non-Medical): No  Physical Activity: Inactive (07/17/2023)   Exercise Vital Sign    Days of Exercise per Week: 0 days    Minutes of Exercise per Session: 0 min  Stress: No Stress Concern Present (07/17/2023)   [employer] of  Occupational Health - Occupational Stress Questionnaire    Feeling of Stress : Not at all  Social Connections: Socially Integrated (09/19/2023)   Social Connection and Isolation Panel [NHANES]    Frequency of Communication with Friends and Family: More than three times a week    Frequency of Social Gatherings with Friends and Family: Three times a week    Attends Religious Services: More than 4 times per year    Active Member of Clubs or Organizations: Yes    Attends Banker Meetings: More than 4 times per year  Marital Status: Married  Catering manager Violence: Not At Risk (09/29/2023)   Humiliation, Afraid, Rape, and Kick questionnaire    Fear of Current or Ex-Partner: No    Emotionally Abused: No    Physically Abused: No    Sexually Abused: No    FAMILY HISTORY: Family History  Problem Relation Age of Onset   Colon cancer Mother        dx 20s; deceased 59   Irritable bowel syndrome Mother    Prostate cancer Father        deceased 60s   Cirrhosis Sister        died of GI bleed associated with cirrhosis of the liver   Stroke Brother    Prostate cancer Brother 28       deceased 54   Diabetes Brother        x3   Kidney disease Brother    Cancer Brother 24       unk. type   Breast cancer Cousin        several maternal 1st and 2nd cousins with breast cancer   Rectal cancer Neg Hx    Stomach cancer Neg Hx    Esophageal cancer Neg Hx     Review of Systems  Constitutional:  Negative for appetite change, chills, fatigue, fever and unexpected weight change.  HENT:   Negative for hearing loss, lump/mass and trouble swallowing.   Eyes:  Negative for eye problems and icterus.  Respiratory:  Negative for chest tightness, cough and shortness of breath.   Cardiovascular:  Negative for chest pain, leg swelling and palpitations.  Gastrointestinal:  Negative for abdominal distention, abdominal pain, constipation, diarrhea, nausea and vomiting.  Endocrine: Negative for  hot flashes.  Genitourinary:  Negative for difficulty urinating.   Musculoskeletal:  Negative for arthralgias.  Skin:  Negative for itching and rash.  Neurological:  Negative for dizziness, extremity weakness, headaches and numbness.  Hematological:  Negative for adenopathy. Does not bruise/bleed easily.  Psychiatric/Behavioral:  Negative for depression. The patient is not nervous/anxious.       PHYSICAL EXAMINATION    Vitals:   10/17/23 1024 10/17/23 1026  BP: (!) 175/73 (!) 146/53  Pulse: 86   Resp: 18   Temp: 98.5 F (36.9 C)   SpO2: 95%     Physical Exam Constitutional:      General: She is not in acute distress.    Appearance: Normal appearance. She is not toxic-appearing.  HENT:     Head: Normocephalic and atraumatic.     Mouth/Throat:     Mouth: Mucous membranes are moist.     Pharynx: Oropharynx is clear. No oropharyngeal exudate or posterior oropharyngeal erythema.  Eyes:     General: No scleral icterus. Cardiovascular:     Rate and Rhythm: Normal rate and regular rhythm.     Pulses: Normal pulses.     Heart sounds: Normal heart sounds.  Pulmonary:     Effort: Pulmonary effort is normal.     Breath sounds: Normal breath sounds.  Chest:     Comments: Right breast s/p lumpectomy and radiation, no sign of local recurrence, left breast benign Abdominal:     General: Abdomen is flat. Bowel sounds are normal. There is no distension.     Palpations: Abdomen is soft.     Tenderness: There is no abdominal tenderness.  Musculoskeletal:        General: No swelling.     Cervical back: Neck supple.  Lymphadenopathy:  Cervical: No cervical adenopathy.     Upper Body:     Right upper body: No supraclavicular or axillary adenopathy.     Left upper body: No supraclavicular or axillary adenopathy.  Skin:    General: Skin is warm and dry.     Findings: No rash.  Neurological:     General: No focal deficit present.     Mental Status: She is alert.  Psychiatric:         Mood and Affect: Mood normal.        Behavior: Behavior normal.      ASSESSMENT and THERAPY PLAN:   Malignant neoplasm of upper-outer quadrant of right breast in female, estrogen receptor positive (HCC) 07/21/17: Rt Lumpectomy: Grade 2 IDC 1.7 cm, 0/2 LN Neg, Margins Neg, ER 95%, PR 30%, Ki-67 15%, HER-2 negative ratio 1.36 T1cN0 Stage 1A   Pathology counseling: I discussed the final pathology report of the patient provided  a copy of this report. I discussed the margins as well as lymph node surgeries. We also discussed the final staging along with previously performed ER/PR and HER-2/neu testing. Oncotype DX recurrence score 14: Risk of distant recurrence of 9 years: 4%, low risk Adjuvant radiation 08/22/2017-09/18/2017 Adjuvant Radiation: 08/22/2017-09/15/2017 Adjuvant antiestrogen therapy with letrozole  2.5 mg daily started on 10/19/2017 _____________________________________________________________________________  Breast cancer, history of On letrozole  since April 2019, nearing 7-year completion. May 2024 mammogram normal. Discussed benefits of completing 7-year letrozole  course. Introduced Guardant Reveal blood testing for early detection of circulating breast cancer cells - Continue letrozole  therapy until April 2026. - Order mammogram for May 2025. - She will decide on Guardant Reveal testing and inform via call or MyChart message.  Ischemic colitis Previously required bowel resection. No current symptoms or concerns.  Pneumonia Pneumonia in December 2024. No current symptoms. Uses inhaler for occasional cough.  Hypotension Severe hypotension due to carvedilol . Carvedilol  discontinued with significant improvement.  Osteopenia Bone density testing showed mild osteopenia with T-score of -2.1 in left femur.  Knee pain Affecting mobility. Plans to see orthopedist for evaluation.  Trigger finger Post-surgical status. No current issues.  Follow-up Follow-up care to  monitor for breast changes or concerns. - Schedule follow-up appointment in April 2026. - Continue f/u with PCP for general health care needs/preventative care - Report any breast changes or concerns immediately.  All questions were answered. The patient knows to call the clinic with any problems, questions or concerns. We can certainly see the patient much sooner if necessary.  Total encounter time:20 minutes*in face-to-face visit time, chart review, lab review, care coordination, order entry, and documentation of the encounter time.    Alwin Baars, NP 10/22/23 3:06 PM Medical Oncology and Hematology  [facility] [address] [place], [place] [address] Tel. [phone]    Fax. [phone]  *Total Encounter Time as defined by the Centers for Medicare and Medicaid Services includes, in addition to the face-to-face time of a patient visit (documented in the note above) non-face-to-face time: obtaining and reviewing outside history, ordering and reviewing medications, tests or procedures, care coordination (communications with other health care professionals or caregivers) and documentation in the medical record.

## 2023-10-18 ENCOUNTER — Ambulatory Visit (INDEPENDENT_AMBULATORY_CARE_PROVIDER_SITE_OTHER): Admitting: Sports Medicine

## 2023-10-18 ENCOUNTER — Other Ambulatory Visit: Payer: Self-pay | Admitting: *Deleted

## 2023-10-18 ENCOUNTER — Ambulatory Visit (INDEPENDENT_AMBULATORY_CARE_PROVIDER_SITE_OTHER): Payer: Self-pay

## 2023-10-18 ENCOUNTER — Encounter: Payer: Self-pay | Admitting: Sports Medicine

## 2023-10-18 DIAGNOSIS — M25511 Pain in right shoulder: Secondary | ICD-10-CM | POA: Diagnosis not present

## 2023-10-18 DIAGNOSIS — M25562 Pain in left knee: Secondary | ICD-10-CM | POA: Diagnosis not present

## 2023-10-18 DIAGNOSIS — M25561 Pain in right knee: Secondary | ICD-10-CM

## 2023-10-18 DIAGNOSIS — G8929 Other chronic pain: Secondary | ICD-10-CM

## 2023-10-18 DIAGNOSIS — E1142 Type 2 diabetes mellitus with diabetic polyneuropathy: Secondary | ICD-10-CM

## 2023-10-18 DIAGNOSIS — M170 Bilateral primary osteoarthritis of knee: Secondary | ICD-10-CM

## 2023-10-18 IMAGING — DX DG CHEST 2V
2 series · 2 of 2 positions shown · non-contrast
Comparison: 04/29/2020

CLINICAL DATA: Cough and shortness of breath.

EXAM:
CHEST - 2 VIEW

[chest pa]
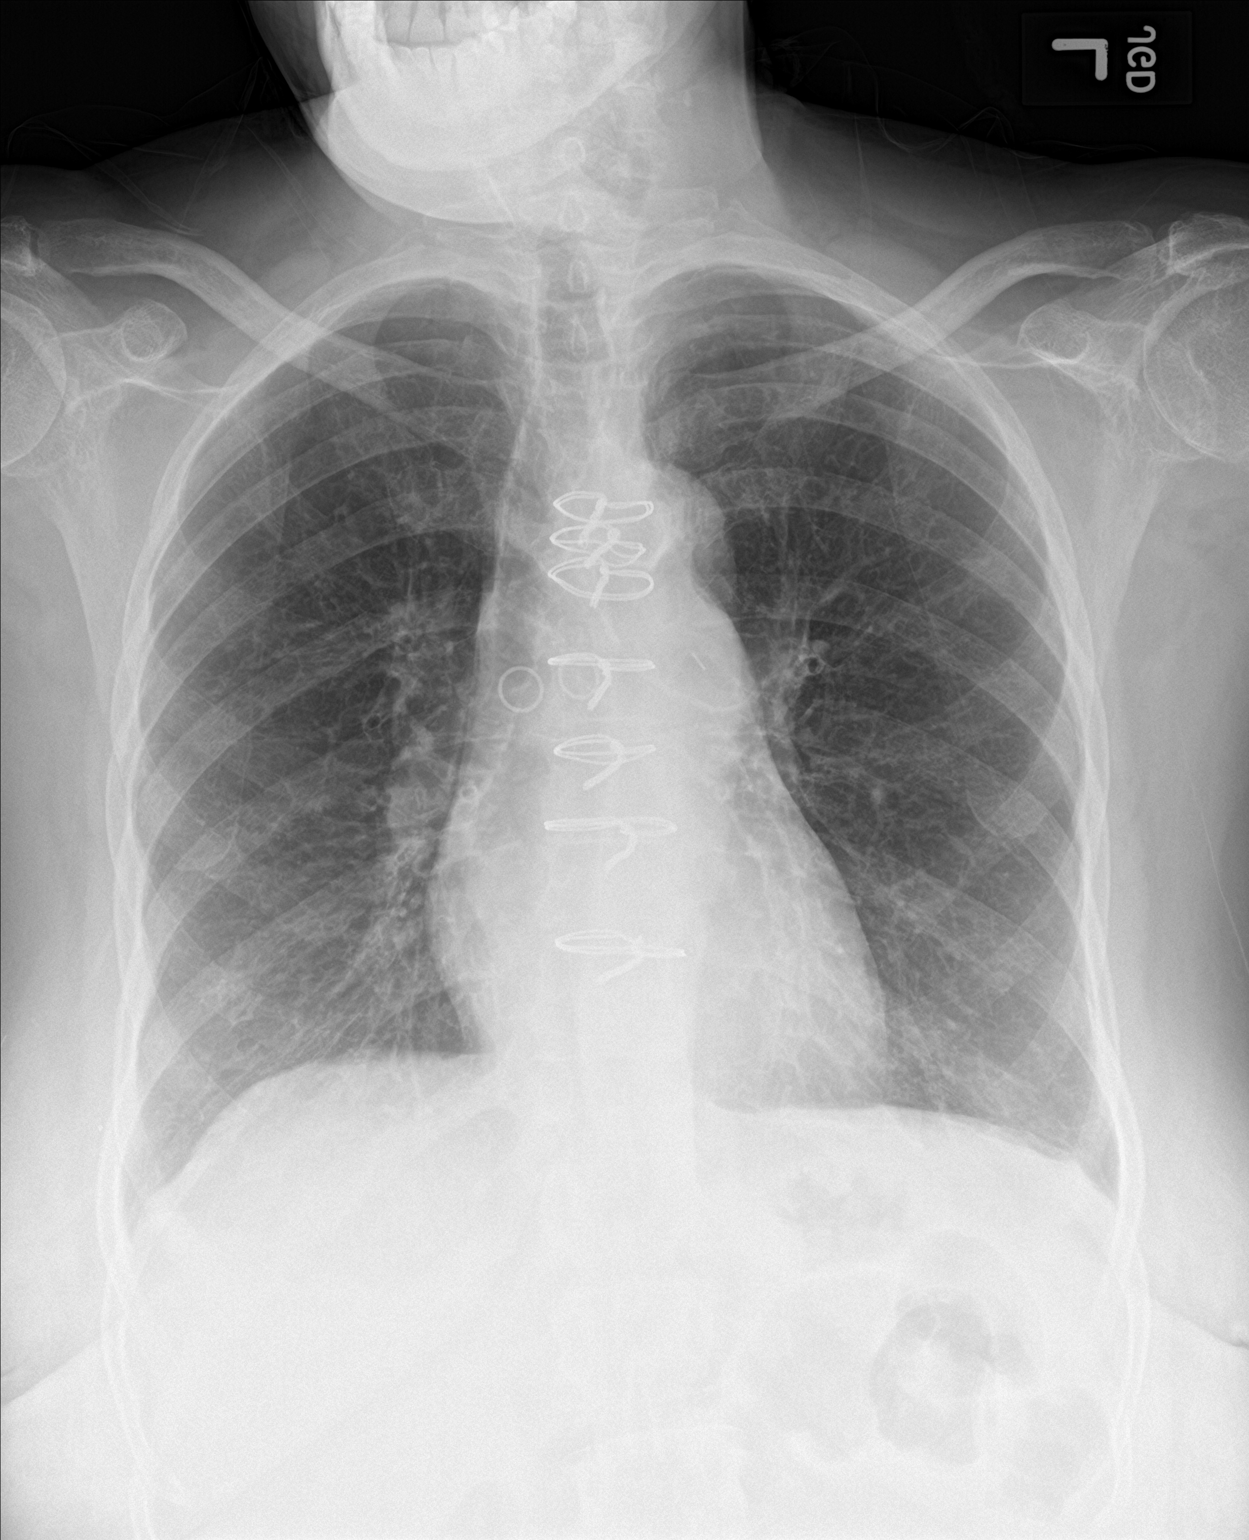

[chest lat]
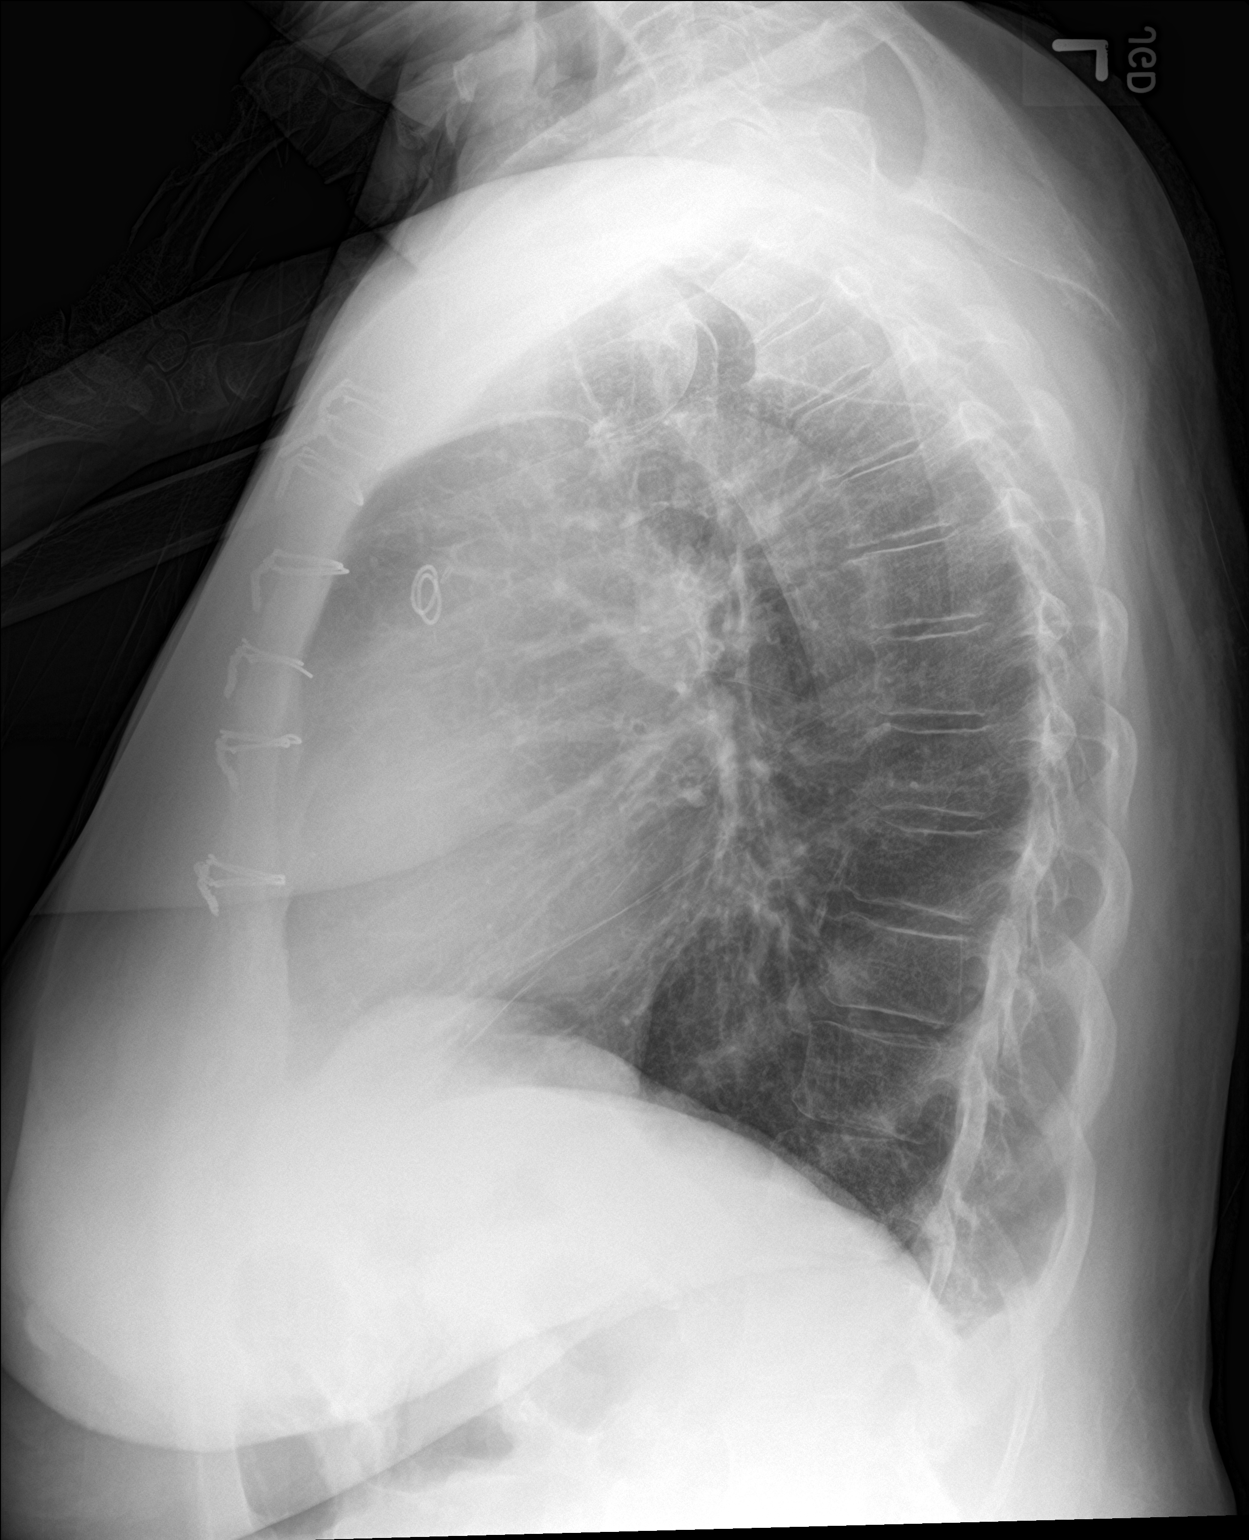

[2 of 2 positions shown; findings below may reference images not displayed]

FINDINGS: And CABG procedure. T previous median sternotomy he heart size and
mediastinal contours are within normal limits. Both lungs are clear.
The visualized skeletal structures are unremarkable.
IMPRESSION: No active cardiopulmonary disease.

## 2023-10-18 MED ORDER — METHYLPREDNISOLONE ACETATE 40 MG/ML IJ SUSP
40.0000 mg | INTRAMUSCULAR | Status: AC | PRN
  Administered 2023-10-18: 40 mg via INTRA_ARTICULAR

## 2023-10-18 MED ORDER — LIDOCAINE HCL 1 % IJ SOLN
2.0000 mL | INTRAMUSCULAR | Status: AC | PRN
Start: 2023-10-18 — End: 2023-10-18
  Administered 2023-10-18: 2 mL

## 2023-10-18 MED ORDER — BUPIVACAINE HCL 0.25 % IJ SOLN
2.0000 mL | INTRAMUSCULAR | Status: AC | PRN
  Administered 2023-10-18: 2 mL via INTRA_ARTICULAR

## 2023-10-18 MED ORDER — LIDOCAINE HCL 1 % IJ SOLN
2.0000 mL | INTRAMUSCULAR | Status: AC | PRN
  Administered 2023-10-18: 2 mL

## 2023-10-18 NOTE — Progress Notes (Signed)
 Patient says that she has been having bilateral knee pain for awhile now that has worsened over the last month or so. She says that the right knee is worse than the left, and she will also have occasional sharp pain in the medial aspect of the right knee. Both knees hurt her most when she goes from seated to standing, and she is having trouble walking due to pain. She is unable to go up the stairs by leading with the right leg. She denies any falls or injuries.  Patient says that she got about 6-8 weeks of relief from the injection for her shoulder. She says that she has gradually had return of pain that is worse at night. She describes her pain as throbbing and aching. She has not had any new injury to the shoulder.

## 2023-10-18 NOTE — Transitions of Care (Post Inpatient/ED Visit) (Signed)
 Transition of Care week 4  Visit Note  10/18/2023  Name: [name] MRN: [id number]          DOB: [birth year]  Situation: Patient enrolled in [facility] 30-day program. Visit completed with Ms. Salas by telephone.   Background:   Initial Transition Care Management Follow-up Telephone Call Date of Discharge: 09/15/23 Discharge Facility: Other [facility]) Name of Other ([facility]) Discharge Facility: [facility] Type of Discharge: Inpatient Admission  Past Medical History:  Diagnosis Date   Abscess in epidural space of L2-L5 lumbar spine 03/2006   Anemia, iron  deficiency    Arthritis    Breast cancer (HCC) 06/12/2017   right breast   CAD (coronary artery disease), native coronary artery 03/30/2015   Cataract    Colon polyp    a. Multiple colonic polyps status post colonoscopy in October 2007, consistent with tubular adenoma, tubulovillous adenoma with no high-grade dysplasia or malignancy identified.    Constipation    Coronary artery disease    a. s/p CABG 06/2010: S-D2; S-PDA (at time of MV surgery).   Depression    Diabetes mellitus    type II   Family history of breast cancer    Family history of prostate cancer    Genetic testing 09/25/2017   Multi-Cancer panel (83 genes) @ [facility] - No pathogenic mutations detected   GERD (gastroesophageal reflux disease)    Hx of transient ischemic attack (TIA)    a. See stroke section.   Hyperlipidemia    Hypertension    Hypoxia    a. Has history of acute hypoxic respiratory failure in the setting of bronchitis/PNA or prior admissions.   MRSA infection    a. History of recurrent skin infection and soft tissue abscesses, with MRSA positive in the past.   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 mitral valve - s/p resection and MV repair 06/2010 Dr. Alva Jewels.   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 Distal R MCA.   Stroke [facility])    a. 11/2009: mitral mass diagnosed at this time, also has distal R MCA stenosis, tx with coumadin . b. Readmitted 05/2010 with TIA symptoms - had not been taking Coumadin . s/p MV surgery 06/2010. Coumadin  stopped 2013 after review of chart by Dr. Audery Blazing since mass was removed (stroke felt possibly related to this).     Assessment: Patient Reported Symptoms: Cognitive Cognitive Status: Alert and oriented to person, place, and time, Insightful and able to interpret abstract concepts, Normal speech and language skills, Able to follow simple commands      Neurological Neurological Review of Symptoms: No symptoms reported    HEENT HEENT Symptoms Reported: No symptoms reported      Cardiovascular Cardiovascular Symptoms Reported: No symptoms reported Cardiovascular Comment: Patient reports BP today 149/73, she has not taken her daily medications due to an early office appointment. Will take medications when she gets home.  Respiratory Respiratory Symptoms Reported: No symptoms reported    Endocrine Patient reports the following symptoms related to hypoglycemia or hyperglycemia : No symptoms reported Is patient diabetic?: Yes Is patient checking blood sugars at home?: Yes (daily) Endocrine Conditions: Diabetes Endocrine Self-Management Outcome: 4 (good)  Gastrointestinal Gastrointestinal Symptoms Reported: No symptoms reported      Genitourinary Genitourinary Symptoms Reported: No symptoms reported Genitourinary  Self-Management Outcome: 3 (uncertain) Genitourinary Comment: Schedule with Nephrology on 11/06/23.  Integumentary Integumentary Symptoms Reported: No symptoms reported    Musculoskeletal Musculoskelatal Symptoms Reviewed: Difficulty walking Additional Musculoskeletal Details: Utilizes cane when outside of the house Musculoskeletal Conditions: Joint pain Musculoskeletal Self-Management Outcome: 3  (uncertain) Musculoskeletal Comment: Orthopedic appointment today, injections bilat knees, planning to start PT next week Falls in the past year?: No Patient at Risk for Falls Due to: Impaired mobility Fall risk Follow up: Falls evaluation completed, Falls prevention discussed  Psychosocial Psychosocial Symptoms Reported: No symptoms reported         Vitals:   10/18/23 1140  BP: (!) 149/73    Medications Reviewed Today     Reviewed by Aura Leeds, RN (Registered Nurse) on 10/18/23 at 1129  Med List Status: <None>   Medication Order Taking? Sig Documenting Provider Last Dose Status Informant  albuterol  (VENTOLIN  HFA) 108 (90 Base) MCG/ACT inhaler [id number] Yes Inhale 2 puffs into the lungs every 6 (six) hours as needed for wheezing or shortness of breath. Lawrance Presume, MD 10/17/2023 Active Self  amLODipine  (NORVASC ) 10 MG tablet [id number] Yes TAKE 1 TABLET EVERY DAY Lawrance Presume, MD 10/17/2023 Active Self  aspirin  EC 81 MG tablet [id number] Yes Take 1 tablet (81 mg total) by mouth daily. Pleas Brill, NP 10/17/2023 Active Self  atorvastatin  (LIPITOR ) 80 MG tablet [id number] Yes TAKE 1 TABLET EVERY DAY. Lawrance Presume, MD 10/17/2023 Active   buPROPion  (WELLBUTRIN  XL) 150 MG 24 hr tablet [id number] Yes Take 1 tablet (150 mg total) by mouth 2 (two) times daily. Lawrance Presume, MD 10/17/2023 Active   dapagliflozin  propanediol (FARXIGA ) 5 MG TABS tablet [id number] Yes Take 1 tablet (5 mg total) by mouth daily before breakfast. Lawrance Presume, MD 10/17/2023 Active   diclofenac  Sodium (VOLTAREN ) 1 % GEL [id number] No APPLY 2 GRAMS TOPICALLY 4 (FOUR) TIMES DAILY AS NEEDED.  Patient not taking: Reported on 10/09/2023   Lawrance Presume, MD Not Taking Active Self           Med Note (Eithen Castiglia A   Mon Oct 09, 2023 10:22 AM) Needs refill  docusate sodium  (COLACE) 100 MG capsule [id number] No Take 100 mg by mouth daily as needed for mild constipation.  Patient not taking:  Reported on 10/18/2023   [provider] Not Taking Active Self  DULoxetine  (CYMBALTA ) 30 MG capsule [id number] Yes Take 1 capsule (30 mg total) by mouth daily. Phebe Brasil, MD 10/17/2023 Active Self  FEROSUL 325 (65 Fe) MG tablet [id number] Yes Take 325 mg by mouth daily with breakfast. [provider] 10/17/2023 Active Self  furosemide  (LASIX ) 20 MG tablet [id number] Yes Take 20 mg by mouth in the morning. [provider] 10/17/2023 Active Self  gabapentin  (NEURONTIN ) 300 MG capsule [id number] Yes Take 1 capsule (300 mg total) by mouth 3 (three) times daily. Lawrance Presume, MD 10/17/2023 Active   hydrALAZINE  (APRESOLINE ) 100 MG tablet [id number] Yes Take 1 tablet (100 mg total) by mouth 3 (three) times daily. Jacqueline Matsu, MD 10/17/2023 Active   insulin  glargine (LANTUS ) 100 UNIT/ML Solostar Pen [id number] Yes Inject 10 Units into the skin at bedtime. Lawrance Presume, MD 10/17/2023 Active   Insulin  Pen Needle (PEN NEEDLES) 31G X 8 MM MISC [id number] Yes UAD Lawrance Presume, MD Taking Active   letrozole  (FEMARA ) 2.5 MG tablet [id number] Yes Take 1 tablet (2.5 mg total) by mouth daily with lunch. Cameron Cea, MD 10/17/2023 Active  methocarbamol  (ROBAXIN ) 500 MG tablet [id number] Yes TAKE 1 TABLET EVERY 8 HOURS AS NEEDED FOR MUSCLE SPASMS. Lawrance Presume, MD 10/17/2023 Active   Multiple Vitamin (MULTIVITAMIN WITH MINERALS) TABS tablet [id number] Yes Take 1 tablet by mouth daily with breakfast. [provider] 10/17/2023 Active Self  pantoprazole  (PROTONIX ) 40 MG tablet [id number] Yes Take 40 mg by mouth daily. [provider] 10/17/2023 Active   polyethylene glycol powder (GLYCOLAX /MIRALAX ) 17 GM/SCOOP powder [id number] No Take 17 g by mouth daily as needed for mild constipation.  Patient not taking: Reported on 10/09/2023   [provider] Not Taking Active Self  Semaglutide ,0.25 or 0.5MG /DOS, 2 MG/3ML SOPN [id number] Yes Inject 0.25 mg into the skin  once a week. Lawrance Presume, MD Taking Active   sodium zirconium cyclosilicate  (LOKELMA ) 5 g packet [id number] No Mix 1 packet with 3 tablespoonful of water and drink it.  Patient not taking: Reported on 10/18/2023   Lawrance Presume, MD Not Taking Active            Med Note (Bernal Luhman A   Wed Oct 18, 2023 11:29 AM) [employer] one time   traMADol  (ULTRAM ) 50 MG tablet [id number] Yes Take 1 tablet (50 mg total) by mouth every 12 (twelve) hours as needed.  Patient taking differently: Take 50 mg by mouth every 12 (twelve) hours as needed (for pain).   Lawrance Presume, MD Taking Active Self  traZODone  (DESYREL ) 50 MG tablet [id number] Yes Take 1 tablet (50 mg total) by mouth at bedtime as needed. for sleep Lawrance Presume, MD 10/17/2023 Active            Med Note Saverio Curling, CHERYL A   Tue Sep 19, 2023  5:34 PM)    valsartan  (DIOVAN ) 160 MG tablet [id number] Yes Take 1 tablet (160 mg total) by mouth daily. Lawrance Presume, MD 10/17/2023 Active             Recommendation:   Continue to work with PCP for medication management of HTN  Follow Up Plan:   Patient has met all care management goals. Care Management case will be closed. Patient has been provided contact information should new needs arise.   Arna Better RN, BSN [place]  [facility] RN Care Manager [phone]

## 2023-10-18 NOTE — Progress Notes (Addendum)
 [name] - 73 y.o. female MRN [id number]  Date of birth: 03-15-1951  Office Visit Note: Visit Date: 10/18/2023 PCP: Lawrance Presume, MD Referred by: Lawrance Presume, MD  Subjective: Chief Complaint  Patient presents with   Right Knee - Pain   Left Knee - Pain   HPI: [name] is a pleasant 73 y.o. female who presents today for evaluation of bilateral knee pain, also follow-up on right shoulder.  Bilateral knees -she has a chronic history of bilateral knee pain, the right knee is worse than the left.  Her pain has been exacerbated over the last month or so with prolonged standing or walking.  She does use a cane when she is out and about only, not usually at home.  Her pain is more so over the medial aspect of the right knee.  Has pain with transferring from sitting to standing and going up and down steps.  She does report some swelling in the legs at times, this improves with more sedentary.  Right shoulder -back in October we did perform glenohumeral joint injection which gave her good relief for at least 6-8 weeks although her pain is slowly started to return.  For pain control she is using gabapentin  300 mg twice to 3 times daily.  Also uses intermittent Robaxin  500 mg as needed.  She is a type-II diabetic. Her last A1c was: Lab Results  Component Value Date   HGBA1C 8.5 (H) 06/27/2023   Pertinent ROS were reviewed with the patient and found to be negative unless otherwise specified above in HPI.   Assessment & Plan: Visit Diagnoses:  1. Bilateral primary osteoarthritis of knee   2. Chronic pain of right knee   3. Chronic pain of left knee   4. Chronic right shoulder pain   5. DM type 2 with diabetic peripheral neuropathy (HCC)    Plan: Impression is chronic bilateral knee osteoarthritis with more progression of acute exacerbation of each of her knees.  This is in the setting of proximal leg muscle weakness which is making transferring and seated to  standing difficult for her.  Through shared decision-making, we did proceed with bilateral knee corticosteroid injection, patient tolerated well.  Advised on postinjection protocol, may use ice/heat or Tylenol  for pain control. For her pain as well as her neuropathy, she will continue her gabapentin  300 mg 2-3 times daily as needed.  I would like to get her started in formalized physical therapy for her knee OA as well as working on stability and strengthening with her gait and transferring given her proximal muscle weakness.  We will see how she does after these injections and with the above therapy and she will follow-up after this.  In terms of her right shoulder, if this persist we could consider 1 additional ultrasound-guided injection versus further evaluation with MRI.  Follow-up: Return in about 6 weeks (around 11/29/2023), or if symptoms worsen or fail to improve.   Meds & Orders: No orders of the defined types were placed in this encounter.   Orders Placed This Encounter  Procedures   Large Joint Inj: R knee   Large Joint Inj: L knee   XR Knee Complete 4 Views Right   XR Knee Complete 4 Views Left   Ambulatory referral to Physical Therapy     Procedures: Large Joint Inj: R knee on 10/18/2023 9:26 AM Indications: pain Details: 22 G 1.5 in needle, anteromedial approach Medications: 2 mL lidocaine  1 %; 2  mL bupivacaine  0.25 %; 40 mg methylPREDNISolone  acetate 40 MG/ML Outcome: tolerated well, no immediate complications  Knee Injection, Right: After discussion on risks/benefits/indications, informed verbal consent was obtained and a timeout was performed, patient was seated on exam table. The patient's knee was prepped with Betadine and alcohol swab and utilizing anteromedial approach, the patient's knee was injected intraarticularly with 2:2:1 lidocaine  1%:bupivicaine 0.25%:depomedrol. Patient tolerated the procedure well without immediate complications.  Procedure, treatment  alternatives, risks and benefits explained, specific risks discussed. Consent was given by the patient. Patient was prepped and draped in the usual sterile fashion.    Large Joint Inj: L knee on 10/18/2023 9:26 AM Indications: pain Details: 22 G 1.5 in needle, anterolateral approach Medications: 2 mL lidocaine  1 %; 2 mL bupivacaine  0.25 %; 40 mg methylPREDNISolone  acetate 40 MG/ML Outcome: tolerated well, no immediate complications  Knee Injection, Left: After discussion on risks/benefits/indications, informed verbal consent was obtained and a timeout was performed, patient was seated on exam table. The patient's knee was prepped with Betadine and alcohol swab and utilizing anterolateral approach, the patient's knee was injected intraarticularly with 2:2:1 lidocaine  1%:bupivicaine 0.25%:depomedrol. Patient tolerated the procedure well without immediate complications.  Procedure, treatment alternatives, risks and benefits explained, specific risks discussed. Consent was given by the patient. Patient was prepped and draped in the usual sterile fashion.          Clinical History: No specialty comments available.  She reports that she quit smoking about 13 years ago. Her smoking use included cigarettes. She has never been exposed to tobacco smoke. She has never used smokeless tobacco.  Recent Labs    11/07/22 1516 03/14/23 1424 06/27/23 1423  HGBA1C 6.4 7.3* 8.5*    Objective:    Physical Exam  Gen: Well-appearing, in no acute distress; non-toxic CV: Well-perfused. Warm.  Resp: Breathing unlabored on room air; no wheezing. Psych: Fluid speech in conversation; appropriate affect; normal thought process  Ortho Exam - Bilateral knees: Positive TTP over the medial joint line, right knee greater than left.  There is a small degree of swelling on the right knee, none on the left.  Range of motion is somewhat limited from 0-115-120 degrees.  There is weakness of the proximal muscle legs as  she requires a lot of upper extremity assistance to transfer from sitting to standing.  Imaging: XR Knee Complete 4 Views Left Result Date: 10/18/2023 4 views of bilateral knees including standing AP, Rosenberg, lateral and sunrise views were ordered and reviewed by myself.  There is mild to moderate tricompartmental arthritic change with more narrowing in the medial tibiofemoral joint space.  There is a small spur over the medial femoral condyle of the left knee.  There is vascular calcification noted of both legs.  No acute fracture or otherwise acute bony abnormality noted.  XR Knee Complete 4 Views Right Result Date: 10/18/2023 4 views of bilateral knees including standing AP, Rosenberg, lateral and sunrise views were ordered and reviewed by myself.  There is mild to moderate tricompartmental arthritic change with more narrowing in the medial tibiofemoral joint space.  There is a small spur over the medial femoral condyle of the left knee.  There is vascular calcification noted of both legs.  No acute fracture or otherwise acute bony abnormality noted.   Past Medical/Family/Surgical/Social History: Medications & Allergies reviewed per EMR, new medications updated. Patient Active Problem List   Diagnosis Date Noted   Pneumonia 06/26/2023   Colonic mass 06/08/2023   Chronic right shoulder  pain 03/28/2023   Neck pain 03/28/2023   Primary osteoarthritis of right shoulder 08/10/2022   CAP (community acquired pneumonia) 06/22/2022   Stage 3b chronic kidney disease (CKD) (HCC) 06/22/2022   Trigger finger, left index finger 12/02/2021   DM type 2 with diabetic peripheral neuropathy (HCC) 07/26/2021   Bilateral low back pain with bilateral sciatica 07/26/2021   Gait abnormality 07/26/2021   Controlled substance agreement signed 12/31/2020   Shoulder stiffness, left 07/07/2020   Status post lumbar spine surgery for decompression of spinal cord 06/09/2020   Lumbar stenosis 05/27/2020   Spinal  stenosis of lumbar region 05/21/2019   Complete tear of left rotator cuff 05/16/2019   Uncontrolled type 2 diabetes mellitus with hyperglycemia (HCC) 12/19/2018   Microalbuminuria 04/08/2018   Genetic testing 09/25/2017   Family history of breast cancer    Family history of prostate cancer    Malignant neoplasm of upper-outer quadrant of right breast in female, estrogen receptor positive (HCC) 06/26/2017   Lumbar herniated disc 04/15/2015   Abnormal nuclear stress test 04/03/2015   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 Multiple colonic polyps status post colonoscopy in October 2007, consistent with tubular adenoma, tubulovillous adenoma with no high-grade dysplasia or malignancy identified.    Constipation    Coronary artery disease    a. s/p CABG 06/2010: S-D2; S-PDA (at time of MV surgery).   Depression    Diabetes mellitus    type II   Family history of breast cancer    Family history of prostate cancer    Genetic testing 09/25/2017   Multi-Cancer panel (83 genes) @ [facility] - No pathogenic mutations detected   GERD (gastroesophageal reflux disease)    Hx of transient ischemic attack (TIA)    a. See stroke section.   Hyperlipidemia    Hypertension    Hypoxia    a. Has history of acute hypoxic respiratory failure in the setting of bronchitis/PNA or prior admissions.   MRSA infection    a. History of recurrent skin infection and soft tissue abscesses, with MRSA positive in  the past.   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 mitral valve - s/p resection and MV repair 06/2010 Dr. Alva Jewels.   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 Distal R MCA.   Stroke [facility])    a. 11/2009: mitral mass diagnosed at this time, also has distal R MCA stenosis, tx with coumadin . b. Readmitted 05/2010 with TIA symptoms - had not been taking Coumadin . s/p MV surgery 06/2010. Coumadin  stopped 2013 after review of chart by Dr. Audery Blazing since mass was removed (stroke felt possibly related to this).    Family History  Problem Relation Age of Onset   Colon cancer Mother        dx 82s; deceased 58  Irritable bowel syndrome Mother    Prostate cancer Father        deceased 7s   Cirrhosis Sister        died of GI bleed associated with cirrhosis of the liver   Stroke Brother    Prostate cancer Brother 60       deceased 26   Diabetes Brother        x3   Kidney disease Brother    Cancer Brother 21       unk. type   Breast cancer Cousin        several maternal 1st and 2nd cousins with breast cancer   Rectal cancer Neg Hx    Stomach cancer Neg Hx    Esophageal cancer Neg Hx    Past Surgical History:  Procedure Laterality Date   BREAST BIOPSY Right 08/26/2019   benign   BREAST LUMPECTOMY Right    BREAST LUMPECTOMY WITH RADIOACTIVE SEED AND SENTINEL LYMPH NODE BIOPSY Right 07/20/2017   Procedure: RIGHT BREAST LUMPECTOMY WITH RADIOACTIVE SEED AND SENTINEL LYMPH NODE BIOPSY;  Surgeon: Juanita Norlander, MD;  Location: [facility] OR;  Service: General;  Laterality: Right;   CARDIAC CATHETERIZATION  04/03/2015   Procedure: Left Heart Cath and Cors/Grafts Angiography;  Surgeon: Peter M Swaziland, MD;  Location: [facility] INVASIVE CV LAB;  Service: Cardiovascular;;   CORONARY ARTERY BYPASS GRAFT  1/12   history of ankle fractures requiring surgery     LUMBAR LAMINECTOMY/DECOMPRESSION  MICRODISCECTOMY Left 04/15/2015   Procedure: LUMBAR LAMINECTOMY/DECOMPRESSION MICRODISCECTOMY 1 LEVEL;  Surgeon: Garry Kansas, MD;  Location: [facility] NEURO ORS;  Service: Neurosurgery;  Laterality: Left;  Left L23 microdiskectomy   LUMBAR LAMINECTOMY/DECOMPRESSION MICRODISCECTOMY N/A 05/27/2020   Procedure: L2-3, L3-4  decompression;  Surgeon: Adah Acron, MD;  Location: [facility] OR;  Service: Orthopedics;  Laterality: N/A;   MV repair and resection of mass  1/12   RESECTION DISTAL CLAVICAL Left 05/16/2019   Procedure: RESECTION DISTAL CLAVICAL;  Surgeon: Arnie Lao, MD;  Location: [facility];  Service: Orthopedics;  Laterality: Left;   SHOULDER ARTHROSCOPY WITH ROTATOR CUFF REPAIR AND SUBACROMIAL DECOMPRESSION Left 05/16/2019   Procedure: SHOULDER ARTHROSCOPY WITH ROTATOR CUFF REPAIR AND SUBACROMIAL DECOMPRESSION;  Surgeon: Arnie Lao, MD;  Location: [facility];  Service: Orthopedics;  Laterality: Left;   TONSILLECTOMY  1971   TOTAL ABDOMINAL HYSTERECTOMY  1978   TRIGGER FINGER RELEASE Right 12/02/2021   Procedure: RIGHT RING FINGER A-1 PULLEY RELEASE;  Surgeon: Arnie Lao, MD;  Location: [facility];  Service: Orthopedics;  Laterality: Right;   TRIGGER FINGER RELEASE Left 05/11/2023   Procedure: LEFT INDEX FINGER RELEASE TRIGGER FINGER;  Surgeon: Arnie Lao, MD;  Location: [facility];  Service: Orthopedics;  Laterality: Left;   VESICOVAGINAL FISTULA CLOSURE W/ TAH     Social History   Occupational History    Employer: UNEMPLOYED  Tobacco Use   Smoking status: Former    Current packs/day: 0.00    Types: Cigarettes    Quit date: 10/14/2010    Years since quitting: 13.0    Passive exposure: Never   Smokeless tobacco: Never  Vaping Use   Vaping status: Never Used  Substance and Sexual Activity   Alcohol use: Yes    Comment: social   Drug use: Not Currently    Types: Marijuana    Sexual activity: Not Currently

## 2023-10-18 NOTE — Patient Instructions (Signed)
 Visit Information  Thank you for taking time to visit with me today. Please don't hesitate to contact me if I can be of assistance to you before our next scheduled telephone appointment.    Patient verbalizes understanding of instructions and care plan provided today and agrees to view in MyChart. Active MyChart status and patient understanding of how to access instructions and care plan via MyChart confirmed with patient.     No further follow up required:    Please call the care guide team at [phone] if you need to cancel or reschedule your appointment.   Please call 1-800-273-TALK (toll free, 24 hour hotline) go to [facility] [address], [place] [phone]) call 911 if you are experiencing a Mental Health or Behavioral Health Crisis or need someone to talk to.  Arna Better RN, BSN [place]  [facility] RN Care Manager [phone]

## 2023-10-19 ENCOUNTER — Telehealth: Payer: Self-pay | Admitting: Internal Medicine

## 2023-10-19 NOTE — Telephone Encounter (Signed)
 Please advise.  Copied from CRM [id number]. Topic: Clinical - Request for Lab/Test Order  >> Oct 18, 2023  4:36 PM Donald Frost wrote:  Reason for CRM: The patient called in stating she spoke with someone earlier from her providers office who told her to go to [facility] on [address] in [place] for more lab work. She went and they told her they did not get an order. She is confused on if she is supposed to get more blood work done as she just got some down last week. Please assist patient further

## 2023-10-21 NOTE — Telephone Encounter (Signed)
 Please refer to my telephone note where I called the patient on 10/13/2023.  Yes, she does need to return to our lab to have this repeat blood test done to recheck potassium level.

## 2023-10-22 ENCOUNTER — Encounter: Payer: Self-pay | Admitting: Adult Health

## 2023-10-22 NOTE — Assessment & Plan Note (Signed)
 07/21/17: Rt Lumpectomy: Grade 2 IDC 1.7 cm, 0/2 LN Neg, Margins Neg, ER 95%, PR 30%, Ki-67 15%, HER-2 negative ratio 1.36 T1cN0 Stage 1A   Pathology counseling: I discussed the final pathology report of the patient provided  a copy of this report. I discussed the margins as well as lymph node surgeries. We also discussed the final staging along with previously performed ER/PR and HER-2/neu testing. Oncotype DX recurrence score 14: Risk of distant recurrence of 9 years: 4%, low risk Adjuvant radiation 08/22/2017-09/18/2017 Adjuvant Radiation: 08/22/2017-09/15/2017 Adjuvant antiestrogen therapy with letrozole  2.5 mg daily started on 10/19/2017 _____________________________________________________________________________  Breast cancer, history of On letrozole  since April 2019, nearing 7-year completion. May 2024 mammogram normal. Discussed benefits of completing 7-year letrozole  course. Introduced Guardant Reveal blood testing for early detection of circulating breast cancer cells - Continue letrozole  therapy until April 2026. - Order mammogram for May 2025. - She will decide on Guardant Reveal testing and inform via call or MyChart message.  Ischemic colitis Previously required bowel resection. No current symptoms or concerns.  Pneumonia Pneumonia in December 2024. No current symptoms. Uses inhaler for occasional cough.  Hypotension Severe hypotension due to carvedilol . Carvedilol  discontinued with significant improvement.  Osteopenia Bone density testing showed mild osteopenia with T-score of -2.1 in left femur.  Knee pain Affecting mobility. Plans to see orthopedist for evaluation.  Trigger finger Post-surgical status. No current issues.  Follow-up Follow-up care to monitor for breast changes or concerns. - Schedule follow-up appointment in April 2026. - Continue f/u with PCP for general health care needs/preventative care - Report any breast changes or concerns immediately.

## 2023-10-23 NOTE — Telephone Encounter (Signed)
 Called & spoke to the patient. Verified name & DOB. Informed patient that lab work is needed and will need patient to come in to our office. Patient will call back to schedule an appointment due to currently driving. Informed patient that she is able to walk in during open lab hours. Patient expressed verbal understanding.

## 2023-10-27 ENCOUNTER — Ambulatory Visit: Attending: Internal Medicine

## 2023-10-27 DIAGNOSIS — E875 Hyperkalemia: Secondary | ICD-10-CM

## 2023-10-28 LAB — BASIC METABOLIC PANEL WITH GFR
BUN/Creatinine Ratio: 15 (ref 12–28)
BUN: 25 mg/dL (ref 8–27)
CO2: 19 mmol/L — ABNORMAL LOW (ref 20–29)
Calcium: 8.8 mg/dL (ref 8.7–10.3)
Chloride: 109 mmol/L — ABNORMAL HIGH (ref 96–106)
Creatinine, Ser: 1.7 mg/dL — ABNORMAL HIGH (ref 0.57–1.00)
Glucose: 199 mg/dL — ABNORMAL HIGH (ref 70–99)
Potassium: 5.4 mmol/L — ABNORMAL HIGH (ref 3.5–5.2)
Sodium: 145 mmol/L — ABNORMAL HIGH (ref 134–144)
eGFR: 31 mL/min/{1.73_m2} — ABNORMAL LOW (ref >59)

## 2023-10-29 ENCOUNTER — Telehealth: Payer: Self-pay | Admitting: Internal Medicine

## 2023-10-29 DIAGNOSIS — I10 Essential (primary) hypertension: Secondary | ICD-10-CM

## 2023-10-29 MED ORDER — FUROSEMIDE 20 MG PO TABS: 30.0000 mg | ORAL_TABLET | Freq: Every morning | ORAL | 1 refills | Status: DC

## 2023-10-29 MED ORDER — VALSARTAN 40 MG PO TABS: 40.0000 mg | ORAL_TABLET | Freq: Every day | ORAL | 1 refills | Status: DC

## 2023-10-29 NOTE — Telephone Encounter (Signed)
 Phone call placed to patient today to go over lab results.  Patient informed that her potassium level remains slightly elevated.  She confirms that she is currently on hydralazine  100 mg 3 times a day, valsartan  160 mg 1/2 tablet daily and furosemide  20 mg daily.  I wired whether she has been checking blood pressure.  She confirms that blood pressure readings at home have been in the 140s over 70s.  Recommend that we decrease the valsartan  to 40 mg daily and increase the furosemide  to 20 mg 1.5 tabs daily.  We will plan to recheck potassium level and kidney function on upcoming visit later this month.  Patient expressed understanding.  Updated prescriptions have been sent to her pharmacy for valsartan  and furosemide 

## 2023-10-30 ENCOUNTER — Other Ambulatory Visit: Payer: Self-pay

## 2023-11-01 NOTE — Therapy (Addendum)
 OUTPATIENT PHYSICAL THERAPY LOWER EXTREMITY EVALUATION / DISCHARGE SUMMARY   Patient Name: [name] MRN: [id number] DOB:08-07-1950, 73 y.o., female Today's Date: 11/09/2023   END OF SESSION:  PT End of Session - 11/09/23 1443     Visit Number 1    Date for PT Re-Evaluation 02/01/24    Authorization Type [employer]    Authorization Time Period Auth pending    Authorization - Visit Number 1    Progress Note Due on Visit 10    PT Start Time 1443    PT Stop Time 1536    PT Time Calculation (min) 53 min    Activity Tolerance Patient tolerated treatment well    Behavior During Therapy [facility] for tasks assessed/performed             Past Medical History:  Diagnosis Date   Abscess in epidural space of L2-L5 lumbar spine 03/2006   Anemia, iron  deficiency    Arthritis    Breast cancer (HCC) 06/12/2017   right breast   CAD (coronary artery disease), native coronary artery 03/30/2015   Cataract    Colon polyp    a. Multiple colonic polyps status post colonoscopy in October 2007, consistent with tubular adenoma, tubulovillous adenoma with no high-grade dysplasia or malignancy identified.    Constipation    Coronary artery disease    a. s/p CABG 06/2010: S-D2; S-PDA (at time of MV surgery).   Depression    Diabetes mellitus    type II   Family history of breast cancer    Family history of prostate cancer    Genetic testing 09/25/2017   Multi-Cancer panel (83 genes) @ [facility] - No pathogenic mutations detected   GERD (gastroesophageal reflux disease)    Hx of transient ischemic attack (TIA)    a. See stroke section.   Hyperlipidemia    Hypertension    Hypoxia    a. Has history of acute hypoxic respiratory failure in the setting of bronchitis/PNA or prior admissions.   MRSA infection    a. History of recurrent skin infection and soft tissue abscesses, with MRSA positive in the past.   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 mitral valve - s/p resection and MV  repair 06/2010 Dr. Dusty.   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 Distal R MCA.   Stroke [facility])    a. 11/2009: mitral mass diagnosed at this time, also has distal R MCA stenosis, tx with coumadin . b. Readmitted 05/2010 with TIA symptoms - had not been taking Coumadin . s/p MV surgery 06/2010. Coumadin  stopped 2013 after review of chart by Dr. Pietro since mass was removed (stroke felt possibly related to this).    Past Surgical History:  Procedure Laterality Date   BREAST BIOPSY Right 08/26/2019   benign   BREAST LUMPECTOMY Right    BREAST LUMPECTOMY WITH RADIOACTIVE SEED AND SENTINEL LYMPH NODE BIOPSY Right 07/20/2017   Procedure: RIGHT BREAST LUMPECTOMY WITH RADIOACTIVE SEED AND SENTINEL LYMPH NODE BIOPSY;  Surgeon: Ethyl Lenis, MD;  Location: [facility] OR;  Service: General;  Laterality: Right;   CARDIAC CATHETERIZATION  04/03/2015   Procedure: Left Heart Cath and Cors/Grafts Angiography;  Surgeon: Peter M Swaziland, MD;  Location: [facility] INVASIVE CV LAB;  Service: Cardiovascular;;   CORONARY ARTERY BYPASS GRAFT  1/12   history of ankle fractures  requiring surgery     LUMBAR LAMINECTOMY/DECOMPRESSION MICRODISCECTOMY Left 04/15/2015   Procedure: LUMBAR LAMINECTOMY/DECOMPRESSION MICRODISCECTOMY 1 LEVEL;  Surgeon: Reyes Budge, MD;  Location: [facility] NEURO ORS;  Service: Neurosurgery;  Laterality: Left;  Left L23 microdiskectomy   LUMBAR LAMINECTOMY/DECOMPRESSION MICRODISCECTOMY N/A 05/27/2020   Procedure: L2-3, L3-4  decompression;  Surgeon: Barbarann Oneil BROCKS, MD;  Location: [facility] OR;  Service: Orthopedics;  Laterality: N/A;   MV repair and resection of mass  1/12   RESECTION DISTAL CLAVICAL Left 05/16/2019   Procedure: RESECTION DISTAL CLAVICAL;  Surgeon: Vernetta Lonni GRADE, MD;  Location: [facility];  Service: Orthopedics;  Laterality: Left;   SHOULDER ARTHROSCOPY WITH  ROTATOR CUFF REPAIR AND SUBACROMIAL DECOMPRESSION Left 05/16/2019   Procedure: SHOULDER ARTHROSCOPY WITH ROTATOR CUFF REPAIR AND SUBACROMIAL DECOMPRESSION;  Surgeon: Vernetta Lonni GRADE, MD;  Location: [facility];  Service: Orthopedics;  Laterality: Left;   TONSILLECTOMY  1971   TOTAL ABDOMINAL HYSTERECTOMY  1978   TRIGGER FINGER RELEASE Right 12/02/2021   Procedure: RIGHT RING FINGER A-1 PULLEY RELEASE;  Surgeon: Vernetta Lonni GRADE, MD;  Location: [facility];  Service: Orthopedics;  Laterality: Right;   TRIGGER FINGER RELEASE Left 05/11/2023   Procedure: LEFT INDEX FINGER RELEASE TRIGGER FINGER;  Surgeon: Vernetta Lonni GRADE, MD;  Location: [facility];  Service: Orthopedics;  Laterality: Left;   VESICOVAGINAL FISTULA CLOSURE W/ TAH     Patient Active Problem List   Diagnosis Date Noted   Pneumonia 06/26/2023   Colonic mass 06/08/2023   Chronic right shoulder pain 03/28/2023   Neck pain 03/28/2023   Primary osteoarthritis of right shoulder 08/10/2022   CAP (community acquired pneumonia) 06/22/2022   Stage 3b chronic kidney disease (CKD) (HCC) 06/22/2022   Trigger finger, left index finger 12/02/2021   DM type 2 with diabetic peripheral neuropathy (HCC) 07/26/2021   Bilateral low back pain with bilateral sciatica 07/26/2021   Gait abnormality 07/26/2021   Controlled substance agreement signed 12/31/2020   Shoulder stiffness, left 07/07/2020   Status post lumbar spine surgery for decompression of spinal cord 06/09/2020   Lumbar stenosis 05/27/2020   Spinal stenosis of lumbar region 05/21/2019   Complete tear of left rotator cuff 05/16/2019   Uncontrolled type 2 diabetes mellitus with hyperglycemia (HCC) 12/19/2018   Microalbuminuria 04/08/2018   Genetic testing 09/25/2017   Family history of breast cancer    Family history of prostate cancer    Malignant neoplasm of upper-outer quadrant of right breast in female, estrogen  receptor positive (HCC) 06/26/2017   Lumbar herniated disc 04/15/2015   Abnormal nuclear stress test 04/03/2015   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   CAD (coronary artery disease), native coronary artery 03/30/2015   S/P mitral valve repair 11/16/2010   Pure hypercholesterolemia 06/29/2010   Anemia of renal disease 09/29/2006   Essential hypertension 09/29/2006    PCP: Vicci Barnie NOVAK, MD   REFERRING PROVIDER: Burnetta Brunet, DO   REFERRING DIAG:  [phone] (ICD-10-CM) - Chronic pain of right knee  M25.562,G89.29 (ICD-10-CM) - Chronic pain of left knee  M17.0 (ICD-10-CM) - Bilateral primary osteoarthritis of knee   THERAPY DIAG:  Chronic pain of both knees  Muscle weakness (generalized)  Other abnormalities of gait and mobility  Localized edema  RATIONALE FOR EVALUATION AND TREATMENT: Rehabilitation  ONSET DATE: 3-4 years, worst over the past year  NEXT MD VISIT: None scheduled    SUBJECTIVE:  SUBJECTIVE STATEMENT: Pt reports she was having excruciating pain in the back on her knees - better since injections.  Feels like she needs support when she stands up.  Also has severe back pain.  Pain prevents her from standing to do HH chores or from walking to go shopping (has to use motorized cart).  Feels like she has to push herself up out of the car, then needs to to take a moment to steady herself. She denies any falls.  PAIN: Are you having pain? Yes: NPRS scale: R 4/10, L 0/10 currently, R & L up to 8/10 at worst recently  Pain location: B medial and posterior knees  Pain description: aching, throbbing  Aggravating factors: standing any length of time, walking, stairs, sit to<from stand  Relieving factors: injections, pain meds - gabapentin , muscle relaxants,  topical analgesics   PERTINENT HISTORY:  Anemia, arthritis, CAD s/p CABG & MVR 2012, depression, DM-II, GERD, TIA, HTN, MCA stenosis, CVA (patient reports residual weakness but unsure which side was affected), TIA, breast cancer, multilevel lumbar laminectomy, B LBP with B sciatica, lumbar stenosis, L RCR with DCE 04/2019, R shoulder OA, obesity  PRECAUTIONS: None  RED FLAGS: None  WEIGHT BEARING RESTRICTIONS: No  FALLS:  Has patient fallen in last 6 months? No  LIVING ENVIRONMENT: Lives with: lives with their spouse and lives with their son Lives in: House/apartment Stairs: Yes: Internal: 15 steps; on right going up and External: 1 steps; none Has following equipment at home: Single point cane, Walker - 4 wheeled, shower chair, bed side commode, and None  OCCUPATION: Retired  PLOF: Independent and Leisure: church booster  PATIENT GOALS:  To be able to stand with less pain.   OBJECTIVE: (objective measures completed at initial evaluation unless otherwise dated)  DIAGNOSTIC FINDINGS:  10/18/23 - XR B knees: There is mild to moderate tricompartmental arthritic change with more narrowing in the medial tibiofemoral joint space.  There is a small spur over the medial femoral condyle of the left knee.  There is vascular calcification noted of both legs.  No acute fracture or otherwise acute bony abnormality  noted.   PATIENT SURVEYS:  LEFS 27 / 80 = 33.8 %, moderate functional limitation  COGNITION: Overall cognitive status: Within functional limits for tasks assessed    SENSATION: WFL  EDEMA:  Near constant edema, worse after she has been on her feet a longer time  POSTURE:  flexed trunk, anterior pelvic tilt, L>R genu recurvatum and valgum, B hip ER with toeing out  PALPATION: Decreased B VMO contraction with quad activation  MUSCLE LENGTH: Hamstrings: mild/mod tight R>L ITB: mild/mod tight R>L Piriformis: mild/mod tight R>L Hip flexors: mild/mod tight R>L Quads:  mod tight R>L Heelcord:   LOWER EXTREMITY ROM:  Active ROM Right eval Left eval  Hip flexion    Hip extension    Hip abduction    Hip adduction    Hip internal rotation    Hip external rotation    Knee flexion 111 115  Knee extension 0 / 0 0 / -2  Ankle dorsiflexion    Ankle plantarflexion    Ankle inversion    Ankle eversion    (Blank rows = not tested)  LOWER EXTREMITY MMT:  MMT Right eval Left eval  Hip flexion 4- 3+ *  Hip extension 2+ 2+  Hip abduction 4- 3+  Hip adduction 4 4  Hip internal rotation 4 4-  Hip external rotation 4 4-  Knee flexion 4+ 4  Knee extension 4 ^ 4  Ankle dorsiflexion 4 4-  Ankle plantarflexion    Ankle inversion    Ankle eversion     (Blank rows = not tested, ^ = pain)  *Unable to initiate SLR on left  FUNCTIONAL TESTS: (TBA next visit) 5 times sit to stand:   30 seconds chair stand test Berg Balance Scale:   Functional gait assessment:    GAIT: Distance walked: Clinic distances Assistive device utilized: Single point cane Level of assistance: SBA Gait pattern: decreased stride length, decreased hip/knee flexion- Right, decreased hip/knee flexion- Left, decreased ankle dorsiflexion- Right, decreased ankle dorsiflexion- Left, wide BOS, poor foot clearance- Right, and poor foot clearance- Left Comments: B hip ER with toed-out gait   TODAY'S TREATMENT:   11/09/2023  SELF CARE:  Reviewed eval findings and role of PT in addressing identified deficits as well as instruction in initial HEP (see below).    PATIENT EDUCATION:  Education details: PT eval findings, anticipated POC, and initial HEP  Person educated: Patient Education method: Explanation, Demonstration, Tactile cues, Verbal cues, and Handouts Education comprehension: verbalized understanding, returned demonstration, verbal cues required, tactile cues required, and needs further education  HOME EXERCISE PROGRAM: Access Code: [email or web address] URL:  [email or web address] Date: 11/09/2023 Prepared by: Elijah Hidden  Exercises - Supine Quad Set on Towel Roll  - 2 x daily - 7 x weekly - 2 sets - 10 reps - 5 sec hold - Hooklying Gluteal Sets  - 2 x daily - 7 x weekly - 2 sets - 10 reps - 5 sec hold - Supine Hip Adduction Isometric with Ball  - 2 x daily - 7 x weekly - 2 sets - 10 reps - 5 sec hold   ASSESSMENT:  CLINICAL IMPRESSION: [name] is a 73 y.o. female who was referred to physical therapy for evaluation and treatment for chronic B knee pain secondary to osteoarthritis.  Patient reports onset of B knee pain beginning ~3-4, worsening over the past year. Pain is worse with any standing activity of any duration.  Patient also reports chronic low back pain limiting standing and activity tolerance.  Patient has deficits in R>L LE flexibility, L>R LE strength, and abnormal posture which are interfering with ADLs and are impacting quality of life.  On LEFS patient scored 27/80 demonstrating moderate functional limitation.  [name] will benefit from skilled PT to address above deficits to improve mobility and activity tolerance with decreased pain interference.  OBJECTIVE IMPAIRMENTS: Abnormal gait, decreased activity tolerance, decreased balance, decreased endurance, decreased knowledge of condition, decreased knowledge of use of DME, decreased mobility, difficulty walking, decreased ROM, decreased strength, decreased safety awareness, increased edema, increased fascial restrictions, impaired perceived functional ability, increased muscle spasms, impaired flexibility, improper body mechanics, postural dysfunction, and pain.   ACTIVITY LIMITATIONS: carrying, lifting, bending, sitting, standing, squatting, stairs, transfers, bed mobility, and locomotion level  PARTICIPATION LIMITATIONS: meal prep, cleaning, laundry, shopping, community activity, and church  PERSONAL FACTORS: Age, Fitness, Past/current experiences, Time since  onset of injury/illness/exacerbation, and 3+ comorbidities: Anemia, arthritis, CAD s/p CABG & MVR 2012, depression, DM-II, GERD, TIA, HTN, MCA stenosis, CVA (patient reports residual weakness but unsure which side was affected), TIA, breast cancer, multilevel lumbar laminectomy, B LBP with B sciatica, lumbar stenosis, L RCR with DCE 04/2019, R shoulder OA, obesity are also affecting patient's functional outcome.   REHAB POTENTIAL: Good  CLINICAL DECISION MAKING: Evolving/moderate complexity  EVALUATION COMPLEXITY: Moderate   GOALS: Goals reviewed with patient? Yes  SHORT TERM  GOALS: Target date: 12/21/2023   Patient will be independent with initial HEP. Baseline:  Goal status: INITIAL  2.  Complete standardized balance assessment with 5xSTS vs 30 seconds sit to stand test, Berg and FGA, and update LTG's accordingly. Baseline: TBA Goal status: INITIAL  3.  Patient will report at least 25% improvement in B knee pain to improve QOL. Baseline: R 4/10, L 0/10 on eval, R & L up to 8/10 at worst Goal status: INITIAL   LONG TERM GOALS: Target date: 02/01/2024  Patient will be independent with advanced/ongoing HEP to improve outcomes and carryover.  Baseline:  Goal status: INITIAL  2.  Patient will report at least 75% improvement in B knee pain to improve QOL. Baseline: R 4/10, L 0/10 on eval, R & L up to 8/10 at worst Goal status: INITIAL  3.  Patient will demonstrate improved B LE strength to >/= 4 to 4+/5 for improved stability and ease of mobility. Baseline: Refer to above LE MMT table Goal status: INITIAL  4.  Patient will be able to ambulate 600' with LRAD and normalized gait pattern without increased pain to access community.  Baseline: Ambulates with SPC with wide BOS, B hip ER, decreased hip and knee flexion and decreased foot clearance Goal status: INITIAL  5. Patient will be able to ascend/descend stairs with 1 HR and reciprocal step pattern safely to access home and  community.  Baseline: NT Goal status: INITIAL  6.  Patient will report >/= 36/80 on LEFS (MCID = 9 pts) to demonstrate improved functional ability. Baseline: 27 / 80 = 33.8 % Goal status: INITIAL  7.  Patient will demonstrate at least 19/30 on FGA to decrease risk of falls. Baseline: TBA Goal status: INITIAL    PLAN:  PT FREQUENCY: 2x/week  PT DURATION: 12 weeks  PLANNED INTERVENTIONS: 97164- PT Re-evaluation, 97750- Physical Performance Testing, 97110-Therapeutic exercises, 97530- Therapeutic activity, [id number]- Neuromuscular re-education, 97535- Self Care, [id number]- Manual therapy, [phone]- Gait training, [phone]- Aquatic Therapy, [phone]- Vasopneumatic device, Patient/Family education, Balance training, Stair training, Taping, Dry Needling, Joint mobilization, DME instructions, Cryotherapy, and Moist heat  PLAN FOR NEXT SESSION: Complete standardized balance testing - 5xSTS vs 30 seconds sit to stand test, Berg and FGA; Review initial HEP; stretching to address proximal LE tightness & progress LE strengthening - update HEP accordingly    Elijah CHRISTELLA Hidden, PT 11/09/2023, 4:36 PM     PHYSICAL THERAPY DISCHARGE SUMMARY  Visits from Start of Care: 1  Current functional level related to goals / functional outcomes: Refer to above clinical impression and goal assessment for status as of eval visit on 11/09/2023. Patient did not schedule any f/u visits and has not returned to PT in >30 days, therefore will proceed with discharge from PT for this episode.     Remaining deficits: As above. Patient did not return to PT.    Education / Equipment: Initial HEP   Patient agrees to discharge. Patient goals were not met. Patient is being discharged due to not returning since the last visit.  Elijah EMERSON Hidden, PT 01/31/2024, 12:36 PM  [facility] [address]  [address] [place], [place], [address] Phone: [phone]   Fax:  [phone]

## 2023-11-09 ENCOUNTER — Other Ambulatory Visit: Payer: Self-pay

## 2023-11-09 ENCOUNTER — Encounter: Payer: Self-pay | Admitting: Physical Therapy

## 2023-11-09 ENCOUNTER — Ambulatory Visit: Attending: Sports Medicine | Admitting: Physical Therapy

## 2023-11-09 DIAGNOSIS — M170 Bilateral primary osteoarthritis of knee: Secondary | ICD-10-CM | POA: Diagnosis not present

## 2023-11-09 DIAGNOSIS — R600 Localized edema: Secondary | ICD-10-CM | POA: Insufficient documentation

## 2023-11-09 DIAGNOSIS — M25561 Pain in right knee: Secondary | ICD-10-CM | POA: Insufficient documentation

## 2023-11-09 DIAGNOSIS — M25562 Pain in left knee: Secondary | ICD-10-CM | POA: Diagnosis not present

## 2023-11-09 DIAGNOSIS — M6281 Muscle weakness (generalized): Secondary | ICD-10-CM | POA: Diagnosis not present

## 2023-11-09 DIAGNOSIS — G8929 Other chronic pain: Secondary | ICD-10-CM | POA: Diagnosis not present

## 2023-11-09 DIAGNOSIS — R2689 Other abnormalities of gait and mobility: Secondary | ICD-10-CM | POA: Diagnosis not present

## 2023-11-13 DIAGNOSIS — Z794 Long term (current) use of insulin: Secondary | ICD-10-CM | POA: Diagnosis not present

## 2023-11-13 DIAGNOSIS — H04123 Dry eye syndrome of bilateral lacrimal glands: Secondary | ICD-10-CM | POA: Diagnosis not present

## 2023-11-13 DIAGNOSIS — H26493 Other secondary cataract, bilateral: Secondary | ICD-10-CM | POA: Diagnosis not present

## 2023-11-13 DIAGNOSIS — H35033 Hypertensive retinopathy, bilateral: Secondary | ICD-10-CM | POA: Diagnosis not present

## 2023-11-13 DIAGNOSIS — H35372 Puckering of macula, left eye: Secondary | ICD-10-CM | POA: Diagnosis not present

## 2023-11-13 DIAGNOSIS — E119 Type 2 diabetes mellitus without complications: Secondary | ICD-10-CM | POA: Diagnosis not present

## 2023-11-13 LAB — HM DIABETES EYE EXAM

## 2023-11-14 ENCOUNTER — Ambulatory Visit: Payer: Medicare HMO | Admitting: Internal Medicine

## 2023-11-24 ENCOUNTER — Ambulatory Visit: Attending: Internal Medicine | Admitting: Internal Medicine

## 2023-11-24 ENCOUNTER — Other Ambulatory Visit: Payer: Self-pay

## 2023-11-24 ENCOUNTER — Encounter: Payer: Self-pay | Admitting: Internal Medicine

## 2023-11-24 DIAGNOSIS — Z794 Long term (current) use of insulin: Secondary | ICD-10-CM

## 2023-11-24 DIAGNOSIS — E1159 Type 2 diabetes mellitus with other circulatory complications: Secondary | ICD-10-CM

## 2023-11-24 DIAGNOSIS — E1169 Type 2 diabetes mellitus with other specified complication: Secondary | ICD-10-CM | POA: Diagnosis not present

## 2023-11-24 DIAGNOSIS — E119 Type 2 diabetes mellitus without complications: Secondary | ICD-10-CM | POA: Diagnosis not present

## 2023-11-24 DIAGNOSIS — E875 Hyperkalemia: Secondary | ICD-10-CM

## 2023-11-24 DIAGNOSIS — N1832 Chronic kidney disease, stage 3b: Secondary | ICD-10-CM | POA: Diagnosis not present

## 2023-11-24 DIAGNOSIS — Z6839 Body mass index (BMI) 39.0-39.9, adult: Secondary | ICD-10-CM | POA: Diagnosis not present

## 2023-11-24 DIAGNOSIS — Z7985 Long-term (current) use of injectable non-insulin antidiabetic drugs: Secondary | ICD-10-CM | POA: Diagnosis not present

## 2023-11-24 DIAGNOSIS — I129 Hypertensive chronic kidney disease with stage 1 through stage 4 chronic kidney disease, or unspecified chronic kidney disease: Secondary | ICD-10-CM | POA: Diagnosis not present

## 2023-11-24 MED ORDER — SEMAGLUTIDE(0.25 OR 0.5MG/DOS) 2 MG/3ML ~~LOC~~ SOPN
0.5000 mg | PEN_INJECTOR | SUBCUTANEOUS | 3 refills | Status: DC
  Filled 2023-11-24: qty 9, 84d supply, fill #0

## 2023-11-24 NOTE — Patient Instructions (Signed)
 VISIT SUMMARY:  Today, you came in for a follow-up on your blood pressure and diabetes management. We discussed your current medications, recent blood pressure and blood sugar readings, and made some adjustments to your treatment plan. You also mentioned missing a nephrology appointment, and we addressed your elevated potassium levels.  YOUR PLAN:  -TYPE 2 DIABETES MELLITUS WITH HYPERGLYCEMIA: Type 2 diabetes is a condition where your body does not use insulin  properly, leading to high blood sugar levels. Your A1c is currently 8.3%, and our target is below 7%. Your daily blood sugar readings are good, mostly in the 90s. We will increase your Ozempic  dose to 0.5 mg and monitor your blood sugar closely. If your blood sugar falls below 80, you should stop taking Lantus  insulin . A new prescription for Ozempic  0.5 mg will be sent to your pharmacy.  -HYPERTENSION: Hypertension is high blood pressure, which can lead to serious health problems if not managed. Your blood pressure is well-controlled at 126/63 mmHg, but you have some variability in your home readings. Continue taking valsartan  40 mg daily, furosemide  30 mg daily, hydralazine  100 mg three times daily, and amlodipine  10 mg daily.  -ELEVATED POTASSIUM: Elevated potassium levels can affect heart function and need to be managed carefully. You missed your nephrology appointment, so please reschedule it. We will also recheck your potassium levels with a lab test.  INSTRUCTIONS:  Please reschedule your nephrology appointment as soon as possible. Continue monitoring your blood sugar and blood pressure at home. If your blood sugar falls below 80, stop taking Lantus  insulin . A lab test has been ordered to recheck your potassium levels.

## 2023-11-24 NOTE — Progress Notes (Signed)
 Patient ID: [name], female    DOB: 1951-03-17  MRN: [id number]  CC: Medical Management of Chronic Issues (Chest Congestion)   Subjective: [name] is a 73 y.o. female who presents for chronic ds management. Her concerns today include:  Pt with hx of HTN, DM, HL, depression, chronic back pain, RT breast CA (intraductal CA, ER/PR+, lumpectomy and XRT - completed 08/2017), CKD stage 3, CVA 2011 (residual weakness on LT side), CAD s/p CABG and MVR/resection of fibroelastoma, IDA.   Discussed the use of AI scribe software for clinical note transcription with the patient, who gave verbal consent to proceed.  History of Present Illness [name] is a 73 year old female with hypertension and diabetes who presents for blood pressure management and diabetes follow-up.  HTN/Hyperkalemia: Her blood pressure management includes valsartan  40 mg, furosemide  30 mg (1.5 tablets daily), hydralazine  100 mg three times a day (reports taking 100 mg/150 mg/100 mg), and amlodipine  10 mg daily. Home blood pressure readings show variability, with occasional high readings of 176 mmHg and differences between arms. She is reducing salt intake by avoiding salty foods.  DM: For diabetes management, she is on Farxiga  5 mg, Lantus  insulin  10 units, and Ozempic  0.25 mg. Her last A1c in March was 8.3%, down from 8.5% in January. Checks BS 1-2 times a day but always before BF. Daily blood sugar readings are in the 90s. She has experienced a slight decrease in appetite with Ozempic  and her weight has decreased from 264 lbs in January to 262 lbs today.  CKD/HyperK+: She missed a nephrology appointment due to traffic and plans to reschedule. She has a history of elevated potassium levels and will have her potassium rechecked today.   HM: She recently had an eye exam and received a new prescription. She experiences sensitivity in her feet, feeling even small objects underfoot.    Patient Active Problem  List   Diagnosis Date Noted   Pneumonia 06/26/2023   Colonic mass 06/08/2023   Chronic right shoulder pain 03/28/2023   Neck pain 03/28/2023   Primary osteoarthritis of right shoulder 08/10/2022   CAP (community acquired pneumonia) 06/22/2022   Stage 3b chronic kidney disease (CKD) (HCC) 06/22/2022   Trigger finger, left index finger 12/02/2021   DM type 2 with diabetic peripheral neuropathy (HCC) 07/26/2021   Bilateral low back pain with bilateral sciatica 07/26/2021   Gait abnormality 07/26/2021   Controlled substance agreement signed 12/31/2020   Shoulder stiffness, left 07/07/2020   Status post lumbar spine surgery for decompression of spinal cord 06/09/2020   Lumbar stenosis 05/27/2020   Spinal stenosis of lumbar region 05/21/2019   Complete tear of left rotator cuff 05/16/2019   Uncontrolled type 2 diabetes mellitus with hyperglycemia (HCC) 12/19/2018   Microalbuminuria 04/08/2018   Genetic testing 09/25/2017   Family history of breast cancer    Family history of prostate cancer    Malignant neoplasm of upper-outer quadrant of right breast in female, estrogen receptor positive (HCC) 06/26/2017   Lumbar herniated disc 04/15/2015   Abnormal nuclear stress test 04/03/2015   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   CAD (coronary artery disease), native coronary artery 03/30/2015   S/P mitral valve repair 11/16/2010   Pure hypercholesterolemia 06/29/2010   Anemia of renal disease 09/29/2006   Essential hypertension 09/29/2006     Current Outpatient Medications on File Prior to Visit  Medication Sig Dispense Refill   albuterol  (VENTOLIN  HFA) 108 (90 Base) MCG/ACT inhaler Inhale 2  puffs into the lungs every 6 (six) hours as needed for wheezing or shortness of breath. 1 each 0   amLODipine  (NORVASC ) 10 MG tablet TAKE 1 TABLET EVERY DAY 90 tablet 2   aspirin  EC 81 MG tablet Take 1 tablet (81 mg total) by mouth daily. 30 tablet 5   atorvastatin  (LIPITOR ) 80 MG tablet TAKE 1  TABLET EVERY DAY. 90 tablet 1   buPROPion  (WELLBUTRIN  XL) 150 MG 24 hr tablet Take 1 tablet (150 mg total) by mouth 2 (two) times daily. 180 tablet 1   dapagliflozin  propanediol (FARXIGA ) 5 MG TABS tablet Take 1 tablet (5 mg total) by mouth daily before breakfast.     diclofenac  Sodium (VOLTAREN ) 1 % GEL APPLY 2 GRAMS TOPICALLY 4 (FOUR) TIMES DAILY AS NEEDED. 100 g 0   DULoxetine  (CYMBALTA ) 30 MG capsule Take 1 capsule (30 mg total) by mouth daily. 180 capsule 1   FEROSUL 325 (65 Fe) MG tablet Take 325 mg by mouth daily with breakfast.     furosemide  (LASIX ) 20 MG tablet Take 1.5 tablets (30 mg total) by mouth in the morning. 135 tablet 1   gabapentin  (NEURONTIN ) 300 MG capsule Take 1 capsule (300 mg total) by mouth 3 (three) times daily.     hydrALAZINE  (APRESOLINE ) 100 MG tablet Take 1 tablet (100 mg total) by mouth 3 (three) times daily. 270 tablet 3   insulin  glargine (LANTUS ) 100 UNIT/ML Solostar Pen Inject 10 Units into the skin at bedtime. 15 mL 3   Insulin  Pen Needle (PEN NEEDLES) 31G X 8 MM MISC UAD 100 each 6   letrozole  (FEMARA ) 2.5 MG tablet Take 1 tablet (2.5 mg total) by mouth daily with lunch. 90 tablet 3   methocarbamol  (ROBAXIN ) 500 MG tablet TAKE 1 TABLET EVERY 8 HOURS AS NEEDED FOR MUSCLE SPASMS. 270 tablet 1   Multiple Vitamin (MULTIVITAMIN WITH MINERALS) TABS tablet Take 1 tablet by mouth daily with breakfast.     pantoprazole  (PROTONIX ) 40 MG tablet Take 40 mg by mouth daily.     Semaglutide ,0.25 or 0.5MG /DOS, 2 MG/3ML SOPN Inject 0.25 mg into the skin once a week. 3 mL 1   traMADol  (ULTRAM ) 50 MG tablet Take 1 tablet (50 mg total) by mouth every 12 (twelve) hours as needed. (Patient taking differently: Take 50 mg by mouth every 12 (twelve) hours as needed (for pain).) 40 tablet 0   traZODone  (DESYREL ) 50 MG tablet Take 1 tablet (50 mg total) by mouth at bedtime as needed. for sleep 90 tablet 1   valsartan  (DIOVAN ) 40 MG tablet Take 1 tablet (40 mg total) by mouth daily. 90  tablet 1   docusate sodium  (COLACE) 100 MG capsule Take 100 mg by mouth daily as needed for mild constipation. (Patient not taking: Reported on 11/24/2023)     polyethylene glycol powder (GLYCOLAX /MIRALAX ) 17 GM/SCOOP powder Take 17 g by mouth daily as needed for mild constipation. (Patient not taking: Reported on 10/09/2023)     No current facility-administered medications on file prior to visit.    Allergies  Allergen Reactions   Coreg  [Carvedilol ]     Bradycardia    Social History   Socioeconomic History   Marital status: Married    Spouse name: Not on file   Number of children: 3   Years of education: some college   Highest education level: Not on file  Occupational History    Employer: UNEMPLOYED  Tobacco Use   Smoking status: Former    Current packs/day:  0.00    Types: Cigarettes    Quit date: 10/14/2010    Years since quitting: 13.1    Passive exposure: Never   Smokeless tobacco: Never  Vaping Use   Vaping status: Never Used  Substance and Sexual Activity   Alcohol use: Yes    Comment: social   Drug use: Not Currently    Types: Marijuana   Sexual activity: Not Currently  Other Topics Concern   Not on file  Social History Narrative   Lives with husband, stay at home, uses cane occasionally, still active/ambulatory.    Left-handed.   No daily caffeine use.   Social Drivers of Corporate investment banker Strain: Low Risk  (09/29/2023)   Overall Financial Resource Strain (CARDIA)    Difficulty of Paying Living Expenses: Not hard at all  Food Insecurity: No Food Insecurity (09/29/2023)   Hunger Vital Sign    Worried About Running Out of Food in the Last Year: Never true    Ran Out of Food in the Last Year: Never true  Transportation Needs: No Transportation Needs (10/09/2023)   PRAPARE - Administrator, Civil Service (Medical): No    Lack of Transportation (Non-Medical): No  Physical Activity: Inactive (07/17/2023)   Exercise Vital Sign    Days of  Exercise per Week: 0 days    Minutes of Exercise per Session: 0 min  Stress: No Stress Concern Present (07/17/2023)   [employer] of Occupational Health - Occupational Stress Questionnaire    Feeling of Stress : Not at all  Social Connections: Socially Integrated (09/19/2023)   Social Connection and Isolation Panel [NHANES]    Frequency of Communication with Friends and Family: More than three times a week    Frequency of Social Gatherings with Friends and Family: Three times a week    Attends Religious Services: More than 4 times per year    Active Member of Clubs or Organizations: Yes    Attends Banker Meetings: More than 4 times per year    Marital Status: Married  Catering manager Violence: Not At Risk (09/29/2023)   Humiliation, Afraid, Rape, and Kick questionnaire    Fear of Current or Ex-Partner: No    Emotionally Abused: No    Physically Abused: No    Sexually Abused: No    Family History  Problem Relation Age of Onset   Colon cancer Mother        dx 72s; deceased 54   Irritable bowel syndrome Mother    Prostate cancer Father        deceased 73s   Cirrhosis Sister        died of GI bleed associated with cirrhosis of the liver   Stroke Brother    Prostate cancer Brother 13       deceased 48   Diabetes Brother        x3   Kidney disease Brother    Cancer Brother 49       unk. type   Breast cancer Cousin        several maternal 1st and 2nd cousins with breast cancer   Rectal cancer Neg Hx    Stomach cancer Neg Hx    Esophageal cancer Neg Hx     Past Surgical History:  Procedure Laterality Date   BREAST BIOPSY Right 08/26/2019   benign   BREAST LUMPECTOMY Right    BREAST LUMPECTOMY WITH RADIOACTIVE SEED AND SENTINEL LYMPH NODE BIOPSY Right 07/20/2017  Procedure: RIGHT BREAST LUMPECTOMY WITH RADIOACTIVE SEED AND SENTINEL LYMPH NODE BIOPSY;  Surgeon: Juanita Norlander, MD;  Location[facility];  Service: General;  Laterality: Right;   CARDIAC  CATHETERIZATION  04/03/2015   Procedure: Left Heart Cath and Cors/Grafts Angiography;  Surgeon: Peter M Swaziland, MD;  Location: [facility] INVASIVE CV LAB;  Service: Cardiovascular;;   CORONARY ARTERY BYPASS GRAFT  1/12   history of ankle fractures requiring surgery     LUMBAR LAMINECTOMY/DECOMPRESSION MICRODISCECTOMY Left 04/15/2015   Procedure: LUMBAR LAMINECTOMY/DECOMPRESSION MICRODISCECTOMY 1 LEVEL;  Surgeon: Garry Kansas, MD;  Location: [facility] NEURO ORS;  Service: Neurosurgery;  Laterality: Left;  Left L23 microdiskectomy   LUMBAR LAMINECTOMY/DECOMPRESSION MICRODISCECTOMY N/A 05/27/2020   Procedure: L2-3, L3-4  decompression;  Surgeon: Adah Acron, MD;  Location: [facility] OR;  Service: Orthopedics;  Laterality: N/A;   MV repair and resection of mass  1/12   RESECTION DISTAL CLAVICAL Left 05/16/2019   Procedure: RESECTION DISTAL CLAVICAL;  Surgeon: Arnie Lao, MD;  Location: [facility];  Service: Orthopedics;  Laterality: Left;   SHOULDER ARTHROSCOPY WITH ROTATOR CUFF REPAIR AND SUBACROMIAL DECOMPRESSION Left 05/16/2019   Procedure: SHOULDER ARTHROSCOPY WITH ROTATOR CUFF REPAIR AND SUBACROMIAL DECOMPRESSION;  Surgeon: Arnie Lao, MD;  Location: [facility];  Service: Orthopedics;  Laterality: Left;   TONSILLECTOMY  1971   TOTAL ABDOMINAL HYSTERECTOMY  1978   TRIGGER FINGER RELEASE Right 12/02/2021   Procedure: RIGHT RING FINGER A-1 PULLEY RELEASE;  Surgeon: Arnie Lao, MD;  Location: [facility];  Service: Orthopedics;  Laterality: Right;   TRIGGER FINGER RELEASE Left 05/11/2023   Procedure: LEFT INDEX FINGER RELEASE TRIGGER FINGER;  Surgeon: Arnie Lao, MD;  Location: [facility];  Service: Orthopedics;  Laterality: Left;   VESICOVAGINAL FISTULA CLOSURE W/ TAH      ROS: Review of Systems Negative except as stated above  PHYSICAL EXAM: BP 126/63   Pulse (!) 104   Ht 5\' 8"  (1.727 m)   Wt 262 lb  12.8 oz (119.2 kg)   SpO2 99%   BMI 39.96 kg/m   Wt Readings from Last 3 Encounters:  11/24/23 262 lb 12.8 oz (119.2 kg)  10/17/23 267 lb (121.1 kg)  10/09/23 271 lb 9.6 oz (123.2 kg)    Physical Exam  General appearance - alert, well appearing, and in no distress Mental status - normal mood, behavior, speech, dress, motor activity, and thought processes Neck - supple, no significant adenopathy Chest - clear to auscultation, no wheezes, rales or rhonchi, symmetric air entry Heart -regular rate rhythm, 2/6 soft systolic ejection murmur upper sternal borders. Musculoskeletal -patient ambulates with a cane. Extremities -no lower extremity edema      Latest Ref Rng & Units 10/27/2023   12:02 PM 10/12/2023    8:47 AM 10/10/2023   10:30 AM  CMP  Glucose 70 - 99 mg/dL 865  784  696   BUN 8 - 27 mg/dL 25  26  27    Creatinine 0.57 - 1.00 mg/dL 2.95  2.84  1.32   Sodium 134 - 144 mmol/L 145  140  141   Potassium 3.5 - 5.2 mmol/L 5.4  5.4  6.2   Chloride 96 - 106 mmol/L 109  105  107   CO2 20 - 29 mmol/L 19  18  20    Calcium  8.7 - 10.3 mg/dL 8.8  9.4  9.2   Total Protein 6.0 - 8.5 g/dL   6.4   Total  Bilirubin 0.0 - 1.2 mg/dL   0.3   Alkaline Phos 44 - 121 IU/L   72   AST 0 - 40 IU/L   17   ALT 0 - 32 IU/L   12    Lipid Panel     Component Value Date/Time   CHOL 175 09/28/2023 1009   TRIG 72 09/28/2023 1009   HDL 100 09/28/2023 1009   CHOLHDL 1.8 09/28/2023 1009   CHOLHDL 2.8 04/28/2016 1028   VLDL 19 04/28/2016 1028   LDLCALC 62 09/28/2023 1009   LDLDIRECT 101.7 07/18/2011 0901    CBC    Component Value Date/Time   WBC 7.6 07/17/2023 1614   WBC 4.7 06/29/2023 0537   RBC 4.13 07/17/2023 1614   RBC 3.38 (L) 06/29/2023 0537   HGB 9.8 (L) 07/17/2023 1614   HCT 33.0 (L) 07/17/2023 1614   PLT 253 07/17/2023 1614   MCV 80 07/17/2023 1614   MCH 23.7 (L) 07/17/2023 1614   MCH 24.6 (L) 06/29/2023 0537   MCHC 29.7 (L) 07/17/2023 1614   MCHC 30.5 06/29/2023 0537   RDW 15.2  07/17/2023 1614   LYMPHSABS 1.0 06/26/2023 1517   MONOABS 0.5 06/26/2023 1517   EOSABS 0.0 06/26/2023 1517   BASOSABS 0.1 06/26/2023 1517    ASSESSMENT AND PLAN: 1. Type 2 diabetes mellitus associated with morbid obesity (HCC) (Primary) A1c at 8.3%, target <7%. Blood glucose well-controlled, primarily in the 90s. Decreased appetite with Ozempic  but no significant weight change. - Increase Ozempic  to 0.5 mg.  Advised to watch out for potential side effects. - Monitor blood glucose closely with Ozempic  dose increase. - Discontinue Lantus  insulin  if blood glucose falls below 80. - Semaglutide ,0.25 or 0.5MG /DOS, 2 MG/3ML SOPN; Inject 0.5 mg into the skin once a week.  Dispense: 3 mL; Refill: 3 - Microalbumin / creatinine urine ratio  2. Insulin  long-term use (HCC) 3. Diabetes mellitus treated with oral medication (HCC) 4. Long-term (current) use of injectable non-insulin  antidiabetic drugs See #1 above.  5. Hypertension associated with diabetes (HCC) Blood pressure well-controlled at 126/63 mmHg. Home monitoring variability noted. Adjustments made for elevated potassium. - Continue valsartan  40 mg daily. - Continue furosemide  30 mg daily. - Adjust hydralazine  to 100 mg three times daily. - Continue amlodipine  10 mg daily.  6. Stage 3b chronic kidney disease (HCC) Advised patient to call [facility] and reschedule her appointment.  It is very important that she is seen. - Basic Metabolic Panel  7. Hyperkalemia Due to CKD plus ARB. we have decreased the dose of valsartan  over the past month from 160 mg down to 40 mg daily. Recheck chem today - Basic Metabolic Panel    Patient was given the opportunity to ask questions.  Patient verbalized understanding of the plan and was able to repeat key elements of the plan.   This documentation was completed using Paediatric nurse.  Any transcriptional errors are unintentional.  No orders of the defined types  were placed in this encounter.    Requested Prescriptions    No prescriptions requested or ordered in this encounter    No follow-ups on file.  Concetta Dee, MD, FACP

## 2023-11-25 ENCOUNTER — Ambulatory Visit: Payer: Self-pay | Admitting: Internal Medicine

## 2023-11-25 ENCOUNTER — Other Ambulatory Visit: Payer: Self-pay | Admitting: Internal Medicine

## 2023-11-26 LAB — MICROALBUMIN / CREATININE URINE RATIO
Creatinine, Urine: 46.7 mg/dL
Microalb/Creat Ratio: 35 mg/g{creat} — ABNORMAL HIGH (ref 0–29)
Microalbumin, Urine: 16.5 ug/mL

## 2023-11-26 LAB — BASIC METABOLIC PANEL WITH GFR
BUN/Creatinine Ratio: 16 (ref 12–28)
BUN: 32 mg/dL — ABNORMAL HIGH (ref 8–27)
CO2: 19 mmol/L — ABNORMAL LOW (ref 20–29)
Calcium: 9.4 mg/dL (ref 8.7–10.3)
Chloride: 103 mmol/L (ref 96–106)
Creatinine, Ser: 2.02 mg/dL — ABNORMAL HIGH (ref 0.57–1.00)
Glucose: 119 mg/dL — ABNORMAL HIGH (ref 70–99)
Potassium: 5.2 mmol/L (ref 3.5–5.2)
Sodium: 140 mmol/L (ref 134–144)
eGFR: 25 mL/min/{1.73_m2} — ABNORMAL LOW (ref >59)

## 2023-11-27 ENCOUNTER — Other Ambulatory Visit: Payer: Self-pay

## 2023-11-29 ENCOUNTER — Other Ambulatory Visit: Payer: Self-pay | Admitting: Internal Medicine

## 2023-11-29 DIAGNOSIS — M19011 Primary osteoarthritis, right shoulder: Secondary | ICD-10-CM

## 2023-12-03 ENCOUNTER — Telehealth: Payer: Self-pay | Admitting: Internal Medicine

## 2023-12-03 MED ORDER — DAPAGLIFLOZIN PROPANEDIOL 5 MG PO TABS: 5.0000 mg | ORAL_TABLET | Freq: Every day | ORAL | Status: AC

## 2023-12-03 NOTE — Telephone Encounter (Signed)
-----   Message from Levorn Reason sent at 11/30/2023 10:30 AM EDT ----- Regarding: [facility] patient Hi Dr Lincoln Renshaw,  Mrs Helton reached out to me today. She said you had recommended stopping her farxiga  and she reached out asking for guidance. Looks like proteinuria was way down. Doing well on ozempic . Creatinine on the higher side for her. Her creatinine does tend to fluctuate. I think an SGLT2i is a good med for her kidneys long term. I told her I'd recommend staying on for now and I'll check a BMP in 2 weeks to be on the safe side. If there was another concern please let me know and I can adjust the plan.  Best,  Kennith Peak

## 2023-12-03 NOTE — Telephone Encounter (Signed)
 Let pt know that the nephrologist whom she sees, Dr. Cindra Cree sent me a message.  He recommends that she remains on the Farxiga  for now to help with kidney function.  She should resume the Farxiga  5 mg daily.  Let me know if she needs a new prescription sent and to which pharmacy.

## 2023-12-04 ENCOUNTER — Encounter: Payer: Self-pay | Admitting: Pharmacist

## 2023-12-04 ENCOUNTER — Other Ambulatory Visit (HOSPITAL_BASED_OUTPATIENT_CLINIC_OR_DEPARTMENT_OTHER): Admitting: Pharmacist

## 2023-12-04 DIAGNOSIS — Z794 Long term (current) use of insulin: Secondary | ICD-10-CM

## 2023-12-04 DIAGNOSIS — E119 Type 2 diabetes mellitus without complications: Secondary | ICD-10-CM

## 2023-12-04 DIAGNOSIS — Z7984 Long term (current) use of oral hypoglycemic drugs: Secondary | ICD-10-CM

## 2023-12-04 DIAGNOSIS — Z7985 Long-term (current) use of injectable non-insulin antidiabetic drugs: Secondary | ICD-10-CM

## 2023-12-04 NOTE — Progress Notes (Signed)
 Pharmacy TNM Diabetes Measure Review  S:  Patient was identified in a report as being at risk for failing the True [place] Metric of A1c control (<8%) . Last A1c was 8.3 in March. Last PCP visit was last month. She is due for a repeat A1c this month.  Call placed to patient to discuss diabetes control and medication management. Patient has been seen by the clinical pharmacist with most recent visit on 10/10/23 for BP management.  Current diabetes medications include: Farxiga  5 mg daily, Lantus  10 units daily, Ozempic  0.5 mg weekly Patient reports adherence to taking all medications as prescribed. She was recently increased to Ozempic  0.5 mg weekly when she saw Dr. Lincoln Renshaw 11/24/2023. Denies any NV, abdominal pain.   Insurance coverage: [employer]  Patient denies hypoglycemic events.  Reported home fasting blood sugars: 90s   O:   Lab Results  Component Value Date   HGBA1C 8.5 (H) 06/27/2023   There were no vitals filed for this visit.  Lipid Panel     Component Value Date/Time   CHOL 175 09/28/2023 1009   TRIG 72 09/28/2023 1009   HDL 100 09/28/2023 1009   CHOLHDL 1.8 09/28/2023 1009   CHOLHDL 2.8 04/28/2016 1028   VLDL 19 04/28/2016 1028   LDLCALC 62 09/28/2023 1009   LDLDIRECT 101.7 07/18/2011 0901    Clinical Atherosclerotic Cardiovascular Disease (ASCVD): Yes  The ASCVD Risk score (Arnett DK, et al., 2019) failed to calculate for the following reasons:   Risk score cannot be calculated because patient has a medical history suggesting prior/existing ASCVD   Patient is participating in a Managed Medicaid Plan: No   A/P: Diabetes longstanding currently uncontrolled based on A1c but reported home sugars are at goal. Commended her for this! Patient is able to verbalize appropriate hypoglycemia management plan. Medication adherence appears to be optimal. Scheduled her for an appt with me in July for A1c check. -Continued current regimen. -Patient educated on purpose,  proper use, and potential adverse effects of Ozempic .  -Extensively discussed pathophysiology of diabetes, recommended lifestyle interventions, dietary effects on blood sugar control.  -Counseled on s/sx of and management of hypoglycemia.  -Next A1c anticipated 01/02/2024.   Follow-up:  Pharmacist 01/02/2024  Marene Shape, PharmD, BCACP, CPP Clinical Pharmacist [facility] [phone]

## 2023-12-05 NOTE — Telephone Encounter (Signed)
 Called and spoke to the patient. Verified name & DOB. Informed patient that Dr.Johnson received a message from her nephrologist recommending for her to remain on her Farxiga  5 mg daily. Patient stated that [facility]  sends the medication to her regularly free of cost. She has completed a financial assistance application and was approved to receive medication from them. No need to send a prescription to the pharmacy at this time.

## 2023-12-06 ENCOUNTER — Telehealth: Payer: Self-pay

## 2023-12-06 NOTE — Telephone Encounter (Signed)
 Ordering provider: Dr. Micael Adas Associated diagnoses: I27 ( Pulmonary hypertension) D15.1 (papillary fibroelastoma of the mv) I25.10( ASCAD) WatchPAT PA obtained on 12/06/2023 by Lodema Rimes, CMA. Authorization:  Patient notified of PIN ([id number]) on 12/06/2023 via Notification Method: phone.  Phone note routed to covering staff for follow-up.

## 2023-12-12 ENCOUNTER — Ambulatory Visit: Attending: Cardiology | Admitting: Pharmacist

## 2023-12-12 VITALS — BP 136/75 | HR 84

## 2023-12-12 DIAGNOSIS — I10 Essential (primary) hypertension: Secondary | ICD-10-CM | POA: Diagnosis not present

## 2023-12-12 DIAGNOSIS — E1142 Type 2 diabetes mellitus with diabetic polyneuropathy: Secondary | ICD-10-CM | POA: Diagnosis not present

## 2023-12-12 DIAGNOSIS — N1832 Chronic kidney disease, stage 3b: Secondary | ICD-10-CM | POA: Diagnosis not present

## 2023-12-12 NOTE — Patient Instructions (Signed)
 It was nice meeting you today  We would like your blood pressure to be less than 130/80. It is a little higher than that today but you have not yet taken your medications  Please continue: Amlodipine  10mg  daily Valsartan  40mg  daily Hydralazine  100mg  three times a day  Continue to avoid salt and sugars and use the low potassium diet handout I have printed for you  Let us  know if you have any questions  Joelene Murrain, PharmD, BCACP, CDCES, CPP [facility] [address], [place], [place] [address] Phone: [phone]; Fax: [phone] 12/12/2023 9:18 AM

## 2023-12-12 NOTE — Progress Notes (Signed)
 Patient ID: [name]                 DOB: 1951/02/04                      MRN: [id number]     HPI: [name] is a 73 y.o. female referred by Dr. Shlomo to HTN clinic. PMH is significant for HTN, CAD, COPD, MVR, breast cancer, CKD, and T2DM.  Patient has nephrology appointment next month. Recent eGFR 25, Scr 2.02 on 11/24/23.  Presents today to discuss HTN management. Currently on amlodipine , hydralazine , furosemide , and valsartan . Reports compliance and no adverse effects.  Has not taken her medications yet today. Had a salty lunch at seafood restaurant.  Is concerned regarding potassium. Was recommended to begin a low potassium diet but is not sure where to start. Is already avoiding salt and sugars.  Current HTN meds:  Valsartan  40mg  daily Hydralazine  100mg  TID Amlodipine  10mg  daily  BP goal: <130/80, feasibly <140/90 due to renal function   Wt Readings from Last 3 Encounters:  11/24/23 262 lb 12.8 oz (119.2 kg)  10/17/23 267 lb (121.1 kg)  10/09/23 271 lb 9.6 oz (123.2 kg)   BP Readings from Last 3 Encounters:  11/24/23 126/63  10/18/23 (!) 149/73  10/17/23 (!) 146/53   Pulse Readings from Last 3 Encounters:  11/24/23 (!) 104  10/17/23 86  10/10/23 80    Renal function: CrCl cannot be calculated (Unknown ideal weight.).  Past Medical History:  Diagnosis Date   Abscess in epidural space of L2-L5 lumbar spine 03/2006   Anemia, iron  deficiency    Arthritis    Breast cancer (HCC) 06/12/2017   right breast   CAD (coronary artery disease), native coronary artery 03/30/2015   Cataract    Colon polyp    a. Multiple colonic polyps status post colonoscopy in October 2007, consistent with tubular adenoma, tubulovillous adenoma with no high-grade dysplasia or malignancy identified.    Constipation    Coronary artery disease    a. s/p CABG 06/2010: S-D2; S-PDA (at time of MV surgery).   Depression    Diabetes mellitus    type II   Family history of breast  cancer    Family history of prostate cancer    Genetic testing 09/25/2017   Multi-Cancer panel (83 genes) @ [facility] - No pathogenic mutations detected   GERD (gastroesophageal reflux disease)    Hx of transient ischemic attack (TIA)    a. See stroke section.   Hyperlipidemia    Hypertension    Hypoxia    a. Has history of acute hypoxic respiratory failure in the setting of bronchitis/PNA or prior admissions.   MRSA infection    a. History of recurrent skin infection and soft tissue abscesses, with MRSA positive in the past.   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 mitral valve - s/p resection and MV repair 06/2010 Dr. Dusty.   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 Distal R MCA.   Stroke [facility])    a. 11/2009: mitral mass diagnosed at this time, also has distal R MCA stenosis, tx with coumadin . b. Readmitted 05/2010 with TIA symptoms - had not been taking Coumadin . s/p MV surgery 06/2010. Coumadin  stopped 2013 after review of chart by Dr. Pietro since mass was  removed (stroke felt possibly related to this).     Current Outpatient Medications on File Prior to Visit  Medication Sig Dispense Refill   carvedilol  (COREG ) 25 MG tablet Take 25 mg by mouth 2 (two) times daily.     albuterol  (VENTOLIN  HFA) 108 (90 Base) MCG/ACT inhaler Inhale 2 puffs into the lungs every 6 (six) hours as needed for wheezing or shortness of breath. 1 each 0   amLODipine  (NORVASC ) 10 MG tablet TAKE 1 TABLET EVERY DAY 90 tablet 2   aspirin  EC 81 MG tablet Take 1 tablet (81 mg total) by mouth daily. 30 tablet 5   atorvastatin  (LIPITOR ) 80 MG tablet TAKE 1 TABLET EVERY DAY. 90 tablet 1   buPROPion  (WELLBUTRIN  XL) 150 MG 24 hr tablet Take 1 tablet (150 mg total) by mouth 2 (two) times daily. 180 tablet 1   dapagliflozin  propanediol (FARXIGA ) 5 MG TABS tablet Take 1 tablet (5 mg total) by  mouth daily before breakfast.     diclofenac  Sodium (VOLTAREN ) 1 % GEL APPLY 2 GRAMS TOPICALLY 4 (FOUR) TIMES DAILY AS NEEDED. 100 g 0   docusate sodium  (COLACE) 100 MG capsule Take 100 mg by mouth daily as needed for mild constipation. (Patient not taking: Reported on 11/24/2023)     DULoxetine  (CYMBALTA ) 30 MG capsule Take 1 capsule (30 mg total) by mouth daily. 180 capsule 1   FEROSUL 325 (65 Fe) MG tablet Take 325 mg by mouth daily with breakfast.     furosemide  (LASIX ) 20 MG tablet Take 1.5 tablets (30 mg total) by mouth in the morning. 135 tablet 1   gabapentin  (NEURONTIN ) 300 MG capsule Take 1 capsule (300 mg total) by mouth 3 (three) times daily.     hydrALAZINE  (APRESOLINE ) 100 MG tablet Take 1 tablet (100 mg total) by mouth 3 (three) times daily. 270 tablet 3   insulin  glargine (LANTUS ) 100 UNIT/ML Solostar Pen Inject 10 Units into the skin at bedtime. 15 mL 3   Insulin  Pen Needle (PEN NEEDLES) 31G X 8 MM MISC UAD 100 each 6   letrozole  (FEMARA ) 2.5 MG tablet Take 1 tablet (2.5 mg total) by mouth daily with lunch. 90 tablet 3   methocarbamol  (ROBAXIN ) 500 MG tablet TAKE 1 TABLET EVERY 8 HOURS AS NEEDED FOR MUSCLE SPASMS. 270 tablet 1   Multiple Vitamin (MULTIVITAMIN WITH MINERALS) TABS tablet Take 1 tablet by mouth daily with breakfast.     pantoprazole  (PROTONIX ) 40 MG tablet Take 40 mg by mouth daily.     polyethylene glycol powder (GLYCOLAX /MIRALAX ) 17 GM/SCOOP powder Take 17 g by mouth daily as needed for mild constipation. (Patient not taking: Reported on 10/09/2023)     Semaglutide ,0.25 or 0.5MG /DOS, 2 MG/3ML SOPN Inject 0.5 mg into the skin once a week. 3 mL 3   traMADol  (ULTRAM ) 50 MG tablet Take 1 tablet (50 mg total) by mouth every 12 (twelve) hours as needed (for pain). 40 tablet 0   traZODone  (DESYREL ) 50 MG tablet Take 1 tablet (50 mg total) by mouth at bedtime as needed. for sleep 90 tablet 1   valsartan  (DIOVAN ) 40 MG tablet Take 1 tablet (40 mg total) by mouth daily. 90  tablet 1   No current facility-administered medications on file prior to visit.    Allergies  Allergen Reactions   Coreg  [Carvedilol ]     Bradycardia     Assessment/Plan:  1. Hypertension -  Patient BP in room 136/75 which is slightly above more stringent goal of <130/80  but she has not taken her medications yet today.  Tolerating medications well, no medication changes needed at this time.   Taught patient where to find recommendations from national kidney organizations. Printed and explained diet handouts regarding low potassium foods. Encouraged her to continue restricting salt and sugars. Patient voiced understanding.  Continue: Valsartan  40mg  daily Hydralazine  100mg  TID Amlodipine  10mg  daily  Medford Bolk, PharmD, BCACP, CDCES, CPP [facility] [address] [place], [place] [address] Phone: [phone]; Fax: [phone] 12/26/2023 4:47 PM

## 2023-12-13 ENCOUNTER — Other Ambulatory Visit: Payer: Self-pay | Admitting: Internal Medicine

## 2023-12-13 DIAGNOSIS — E782 Mixed hyperlipidemia: Secondary | ICD-10-CM

## 2023-12-13 NOTE — Telephone Encounter (Signed)
 Requested Prescriptions  Pending Prescriptions Disp Refills   atorvastatin  (LIPITOR ) 80 MG tablet [Pharmacy Med Name: Atorvastatin  Calcium  Oral Tablet 80 MG] 90 tablet 1    Sig: TAKE 1 TABLET EVERY DAY     Cardiovascular:  Antilipid - Statins Failed - 12/13/2023 11:07 AM      Failed - Lipid Panel in normal range within the last 12 months    Cholesterol, Total  Date Value Ref Range Status  09/28/2023 175 100 - 199 mg/dL Final   LDL Chol Calc (NIH)  Date Value Ref Range Status  09/28/2023 62 0 - 99 mg/dL Final   Direct LDL  Date Value Ref Range Status  07/18/2011 101.7 mg/dL Final    Comment:    Optimal:  <100 mg/dLNear or Above Optimal:  100-129 mg/dLBorderline High:  130-159 mg/dLHigh:  160-189 mg/dLVery High:  >190 mg/dL   HDL  Date Value Ref Range Status  09/28/2023 100 >39 mg/dL Final   Triglycerides  Date Value Ref Range Status  09/28/2023 72 0 - 149 mg/dL Final         Passed - Patient is not pregnant      Passed - Valid encounter within last 12 months    Recent Outpatient Visits           2 weeks ago Type 2 diabetes mellitus associated with morbid obesity (HCC)   [facility] [place]. [facility] Lawrance Presume, MD   2 months ago Essential hypertension   [facility] - [facility] Dept Of [place]. [facility] Freada Jacobs, Jonathon Neighbors, RPH-CPP   4 months ago Essential hypertension   [facility] [place] - [facility] Dept Of [place]. [facility] Freada Jacobs, Arnold L, RPH-CPP   4 months ago Type 2 diabetes mellitus with morbid obesity [facility])   [place] - [facility] Dept Of [place]. [facility] Concetta Dee B, MD   9 months ago Type 2 diabetes mellitus with obesity [facility])   [place] [facility] - A Dept Of [place]. [facility] Lawrance Presume, MD       Future Appointments             In 2 weeks Freada Jacobs, Jonathon Neighbors, RPH-CPP [facility] [place] - A Dept Of [place] [facility]. [facility]   In 3 months Lincoln Renshaw, Rexine Cater, MD [facility] [place] - A Dept Of [place]. [facility]

## 2023-12-26 ENCOUNTER — Encounter: Payer: Self-pay | Admitting: Pharmacist

## 2024-01-02 ENCOUNTER — Other Ambulatory Visit: Payer: Self-pay

## 2024-01-02 ENCOUNTER — Ambulatory Visit: Attending: Internal Medicine | Admitting: Pharmacist

## 2024-01-02 ENCOUNTER — Encounter: Payer: Self-pay | Admitting: Pharmacist

## 2024-01-02 DIAGNOSIS — Z794 Long term (current) use of insulin: Secondary | ICD-10-CM

## 2024-01-02 DIAGNOSIS — Z7984 Long term (current) use of oral hypoglycemic drugs: Secondary | ICD-10-CM | POA: Diagnosis not present

## 2024-01-02 DIAGNOSIS — Z7985 Long-term (current) use of injectable non-insulin antidiabetic drugs: Secondary | ICD-10-CM | POA: Diagnosis not present

## 2024-01-02 DIAGNOSIS — E1165 Type 2 diabetes mellitus with hyperglycemia: Secondary | ICD-10-CM

## 2024-01-02 LAB — POCT GLYCOSYLATED HEMOGLOBIN (HGB A1C): HbA1c, POC (controlled diabetic range): 7.5 % — AB (ref 0.0–7.0)

## 2024-01-02 MED ORDER — SEMAGLUTIDE (1 MG/DOSE) 4 MG/3ML ~~LOC~~ SOPN
1.0000 mg | PEN_INJECTOR | SUBCUTANEOUS | 1 refills | Status: DC
  Filled 2024-01-02 (×2): qty 3, 28d supply, fill #0
  Filled 2024-01-25 (×2): qty 3, 28d supply, fill #1

## 2024-01-02 NOTE — Progress Notes (Signed)
 Pharmacy TNM Diabetes Measure Review  S:  Patient was identified in a report as being at risk for failing the True [place] Metric of A1c control (<8%) . Last A1c was 8.3 in March of this year. Last PCP visit 11/24/23. I called her for last month to schedule this visit today. She is due for a repeat A1c today.  73 y.o. female who presents for diabetes evaluation, education, and management. Patient arrives in good spirits and presents without any assistance.  Patient was referred and last seen by Primary Care Provider, Dr. Vicci, on 11/24/23.   PMH is significant for HTN, DM, HL, depression, chronic back pain, RT breast CA (intraductal CA, ER/PR+, lumpectomy and XRT - completed 08/2017), CKD stage 3 (recent AKI during hospitalization 09/12/23), CVA 2011 (residual weakness on LT side), CAD s/p CABG and MVR/resection of fibroelastoma, IDA.   Patient reports Diabetes is longstanding. She is doing better since adding Ozempic . Pharmacy is assisting with MAP for this. She denies any NV, abdominal pain, or changes in vision since starting. She is adherent to Ozempic , Lantus  and Farxiga  as prescribed.  Family/Social History:  -Fhx: colon cancer, IBS, prostate cancer, cirrhosis, stroke, prostate cancer, diabetes, kidney disease, cancer -Smoking: none  Current diabetes medications include: Farxiga  5 mg daily, Lantus  10 units daily, Ozempic  0.5 mg weekly Patient reports adherence to taking all medications as prescribed.  Insurance coverage: [employer]  Patient denies hypoglycemic events. Reported blood sugars: 90s - low 100s.   Patient denies polyuria. Patient reports neuropathy (nerve pain). Patient denies visual changes. Patient reports self foot exams.   Patient reported dietary habits: Eats 1-2 meals/day -Will often eat a smaller breakfast/snack in the morning.  -Eats dinner -Notes a decreased appetite  O:   Lab Results  Component Value Date   HGBA1C 7.5 (A) 01/02/2024   There were no vitals  filed for this visit.  Lipid Panel     Component Value Date/Time   CHOL 175 09/28/2023 1009   TRIG 72 09/28/2023 1009   HDL 100 09/28/2023 1009   CHOLHDL 1.8 09/28/2023 1009   CHOLHDL 2.8 04/28/2016 1028   VLDL 19 04/28/2016 1028   LDLCALC 62 09/28/2023 1009   LDLDIRECT 101.7 07/18/2011 0901    Clinical Atherosclerotic Cardiovascular Disease (ASCVD): Yes  The ASCVD Risk score (Arnett DK, et al., 2019) failed to calculate for the following reasons:   Risk score cannot be calculated because patient has a medical history suggesting prior/existing ASCVD   Patient is participating in a Managed Medicaid Plan: No   A/P: Diabetes longstanding currently close to goal based on A1c of 7.5% today. Commended her for this! Reported home sugars are at goal. Commended her for this! Patient is not symptomatic at this time but is able to verbalize appropriate hypoglycemia management plan. Medication adherence appears to be optimal. -Increase Ozempic  to 1 mg weekly. -Decrease Lantus  to 5 units daily. Advised to stop if hypoglycemia occurs on the 5u dose.  -Continue Farxiga  5 mg once daily.  -Patient educated on purpose, proper use, and potential adverse effects of Ozempic .  -Extensively discussed pathophysiology of diabetes, recommended lifestyle interventions, dietary effects on blood sugar control.  -Counseled on s/sx of and management of hypoglycemia.  -Next A1c anticipated 03/2024.   Follow-up:  Pharmacist in 4 weeks. PCP: 04/02/2024  Herlene Fleeta Morris, PharmD, BCACP, CPP Clinical Pharmacist [facility] [phone]

## 2024-01-11 ENCOUNTER — Ambulatory Visit: Admitting: Sports Medicine

## 2024-01-11 ENCOUNTER — Other Ambulatory Visit: Payer: Self-pay

## 2024-01-12 ENCOUNTER — Other Ambulatory Visit (INDEPENDENT_AMBULATORY_CARE_PROVIDER_SITE_OTHER): Payer: Self-pay

## 2024-01-12 ENCOUNTER — Ambulatory Visit (INDEPENDENT_AMBULATORY_CARE_PROVIDER_SITE_OTHER): Admitting: Sports Medicine

## 2024-01-12 ENCOUNTER — Encounter: Payer: Self-pay | Admitting: Sports Medicine

## 2024-01-12 DIAGNOSIS — M1711 Unilateral primary osteoarthritis, right knee: Secondary | ICD-10-CM | POA: Diagnosis not present

## 2024-01-12 DIAGNOSIS — M170 Bilateral primary osteoarthritis of knee: Secondary | ICD-10-CM

## 2024-01-12 DIAGNOSIS — E1142 Type 2 diabetes mellitus with diabetic polyneuropathy: Secondary | ICD-10-CM | POA: Diagnosis not present

## 2024-01-12 DIAGNOSIS — M25561 Pain in right knee: Secondary | ICD-10-CM

## 2024-01-12 DIAGNOSIS — M1712 Unilateral primary osteoarthritis, left knee: Secondary | ICD-10-CM

## 2024-01-12 DIAGNOSIS — G8929 Other chronic pain: Secondary | ICD-10-CM | POA: Diagnosis not present

## 2024-01-12 DIAGNOSIS — M25461 Effusion, right knee: Secondary | ICD-10-CM | POA: Diagnosis not present

## 2024-01-12 NOTE — Progress Notes (Signed)
 [name] - 73 y.o. female MRN [id number]  Date of birth: 1951/01/23  Office Visit Note: Visit Date: 01/12/2024 PCP: Vicci Barnie NOVAK, MD Referred by: Vicci Barnie NOVAK, MD  Subjective: Chief Complaint  Patient presents with   Right Knee - Pain   HPI: [name] is a pleasant 73 y.o. female who presents today for acute on chronic right knee pain - twisting injury x 1 week.  About 1 week ago on Saturday she was ambulating and she started to fall but was able to catch herself but twisted that her right knee.  She denied hearing or feeling any audible pop.  But following this she started having knee pain.  She does feel some swelling on the knee.  Initially thought the knee was start to get better but it is almost worse today than it was last week.  She has tried various over-the-counter topical treatments without relief.  Ibuprofen  is not helping either.  She does take gabapentin  300 mg 3 times daily for her diabetes.  She has used tramadol  50 mg in the past which she gets from her PCP, but she just ran out of this.  She has worked hard at reducing her A1c, Lab Results  Component Value Date   HGBA1C 7.5 (A) 01/02/2024    Pertinent ROS were reviewed with the patient and found to be negative unless otherwise specified above in HPI.   Assessment & Plan: Visit Diagnoses:  1. Chronic pain of right knee   2. Bilateral primary osteoarthritis of knee   3. Effusion, right knee   4. DM type 2 with diabetic peripheral neuropathy (HCC)    Plan: Impression is bilateral knee osteoarthritis with an acute flare of her right knee with associated effusion after a twisting injury 1 week ago.  Bedside ultrasound shows a moderate to large knee effusion which is likely driving her pain given her OA flare.  Through shared decision making, we did proceed with ultrasound-guided knee aspiration, which yielded approximately 43 cc of clear yellow synovial fluid.  This was then subsequently  injected with corticosteroid.  She felt significantly better minutes after this procedure.  She will use ice, ibuprofen  as needed for post-injection pain. Also recommended neoprene compression knee sleeve to help with swelling and with her pain.  She will monitor her blood glucose given the corticosteroid but is well-controlled so I do not think this will be an issue.  She may continue her gabapentin  300 mg 3 times daily for pain as well as her diabetic related neuropathy.  In the past she has received tramadol  from her PCP, hopefully she will not need this given the knee procedure today, but she may request refill and take tramadol  50 mg as needed for breakthrough pain only if needed.  Follow-up: Return if symptoms worsen or fail to improve.   Meds & Orders: No orders of the defined types were placed in this encounter.   Orders Placed This Encounter  Procedures   Large Joint Inj: R knee   XR Knee Complete 4 Views Right   US  Extrem Low Right Ltd     Procedures: Large Joint Inj: R knee on 01/12/2024 5:35 PM Indications: joint swelling and pain Details: 18 G 1.5 in needle, ultrasound-guided superolateral approach Medications: 5 mL lidocaine  1 %; 2 mL bupivacaine  0.25 %; 40 mg methylPREDNISolone  acetate 40 MG/ML Aspirate: 43 mL clear and yellow Outcome: tolerated well, no immediate complications  US -guided Knee Aspiration, Right: After discussion on risks/benefits/indications  was provided, informed verbal consent was obtained and a timeout was performed, patient was lying supine on exam table with knee bolster pillow in place. The knee was prepped with Chloraprep and multiple alcohol swabs. Utilizing superolateral approach, approximately 5 mL of lidocaine  1% was used for local anesthesia. Then using ultrasound guidance via a transverse-axis and in-plane approach, an 18g, 1.5 needle was inserted and 43 mL of clear straw-colored fluid was aspirated from the knee joint. Utilizing the same portal and  a sterile syringe swap, the knee joint was then injected under ultrasound guidance with 2:1 bupivicaine:depomedrol with flow of the injectate visualized going into the knee joint.  Patient tolerated procedure well without immediate complications.  Procedure, treatment alternatives, risks and benefits explained, specific risks discussed. Consent was given by the patient. Immediately prior to procedure a time out was called to verify the correct patient, procedure, equipment, support staff and site/side marked as required. Patient was prepped and draped in the usual sterile fashion.          Clinical History: No specialty comments available.  She reports that she quit smoking about 13 years ago. Her smoking use included cigarettes. She has never been exposed to tobacco smoke. She has never used smokeless tobacco.  Recent Labs    03/14/23 1424 06/27/23 1423 01/02/24 1426  HGBA1C 7.3* 8.5* 7.5*    Objective:    Physical Exam  Gen: Well-appearing, in no acute distress; non-toxic CV: Well-perfused. Warm.  Resp: Breathing unlabored on room air; no wheezing. Psych: Fluid speech in conversation; appropriate affect; normal thought process  Ortho Exam - Right knee: There is some tenderness both over the medial and lateral joint line.  There is at least a moderate effusion on the knee with some warmth although no redness.  Range of motion from 0-125 degrees but some pain with end range of motion.  There is no ecchymosis noted.  Imaging: XR Knee Complete 4 Views Right Result Date: 01/13/2024 4 view x-ray of the right knee including standing AP, Rosenberg, lateral and sunrise view were ordered and reviewed by myself.  There is moderate tricompartmental arthritic change with slightly advanced lateral joint line narrowing.  There is a small spur off the medial femoral condyle.  There is vascular calcification noted.  No acute fracture noted.  Likely knee effusion present, best seen on lateral  view.  US  Extrem Low Right Ltd Result Date: 01/12/2024 Limited ultrasound showed moderate to large effusion of the right knee. *Procedurally successful ultrasound-guided right knee aspiration and subsequent injection.   Past Medical/Family/Surgical/Social History: Medications & Allergies reviewed per EMR, new medications updated. Patient Active Problem List   Diagnosis Date Noted   Pneumonia 06/26/2023   Colonic mass 06/08/2023   Chronic right shoulder pain 03/28/2023   Neck pain 03/28/2023   Primary osteoarthritis of right shoulder 08/10/2022   CAP (community acquired pneumonia) 06/22/2022   Stage 3b chronic kidney disease (CKD) (HCC) 06/22/2022   Trigger finger, left index finger 12/02/2021   DM type 2 with diabetic peripheral neuropathy (HCC) 07/26/2021   Bilateral low back pain with bilateral sciatica 07/26/2021   Gait abnormality 07/26/2021   Controlled substance agreement signed 12/31/2020   Shoulder stiffness, left 07/07/2020   Status post lumbar spine surgery for decompression of spinal cord 06/09/2020   Lumbar stenosis 05/27/2020   Spinal stenosis of lumbar region 05/21/2019   Complete tear of left rotator cuff 05/16/2019   Uncontrolled type 2 diabetes mellitus with hyperglycemia (HCC) 12/19/2018   Microalbuminuria 04/08/2018  Genetic testing 09/25/2017   Family history of breast cancer    Family history of prostate cancer    Malignant neoplasm of upper-outer quadrant of right breast in female, estrogen receptor positive (HCC) 06/26/2017   Lumbar herniated disc 04/15/2015   Abnormal nuclear stress test 04/03/2015   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   CAD (coronary artery disease), native coronary artery 03/30/2015   COPD (chronic obstructive pulmonary disease) (HCC) 12/24/2013   S/P mitral valve repair 11/16/2010   Pure hypercholesterolemia 06/29/2010   Anemia of renal disease 09/29/2006   Essential hypertension 09/29/2006   Diabetes mellitus without  complication (HCC) 09/29/2006   Past Medical History:  Diagnosis Date   Abscess in epidural space of L2-L5 lumbar spine 03/2006   Anemia, iron  deficiency    Arthritis    Breast cancer (HCC) 06/12/2017   right breast   CAD (coronary artery disease), native coronary artery 03/30/2015   Cataract    Colon polyp    a. Multiple colonic polyps status post colonoscopy in October 2007, consistent with tubular adenoma, tubulovillous adenoma with no high-grade dysplasia or malignancy identified.    Constipation    Coronary artery disease    a. s/p CABG 06/2010: S-D2; S-PDA (at time of MV surgery).   Depression    Diabetes mellitus    type II   Family history of breast cancer    Family history of prostate cancer    Genetic testing 09/25/2017   Multi-Cancer panel (83 genes) @ [facility] - No pathogenic mutations detected   GERD (gastroesophageal reflux disease)    Hx of transient ischemic attack (TIA)    a. See stroke section.   Hyperlipidemia    Hypertension    Hypoxia    a. Has history of acute hypoxic respiratory failure in the setting of bronchitis/PNA or prior admissions.   MRSA infection    a. History of recurrent skin infection and soft tissue abscesses, with MRSA positive in the past.   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 mitral valve - s/p resection and MV repair 06/2010 Dr. Dusty.   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 Distal R MCA.   Stroke [facility])    a. 11/2009: mitral mass diagnosed at this time, also has distal R MCA stenosis, tx with coumadin . b. Readmitted 05/2010 with TIA symptoms - had not been taking Coumadin . s/p MV surgery 06/2010. Coumadin  stopped 2013 after review of chart by Dr. Pietro since mass was removed (stroke felt possibly related to this).    Family History  Problem Relation Age of Onset   Colon cancer Mother        dx 30s;  deceased 73   Irritable bowel syndrome Mother    Prostate cancer Father        deceased 43s   Cirrhosis Sister        died of GI bleed associated with cirrhosis of the liver   Stroke Brother    Prostate cancer Brother 39       deceased 58   Diabetes Brother        x3   Kidney disease Brother    Cancer Brother 34       unk. type   Breast cancer Cousin        several maternal 1st and 2nd cousins with breast cancer  Rectal cancer Neg Hx    Stomach cancer Neg Hx    Esophageal cancer Neg Hx    Past Surgical History:  Procedure Laterality Date   BREAST BIOPSY Right 08/26/2019   benign   BREAST LUMPECTOMY Right    BREAST LUMPECTOMY WITH RADIOACTIVE SEED AND SENTINEL LYMPH NODE BIOPSY Right 07/20/2017   Procedure: RIGHT BREAST LUMPECTOMY WITH RADIOACTIVE SEED AND SENTINEL LYMPH NODE BIOPSY;  Surgeon: Ethyl Lenis, MD;  Location: [facility] OR;  Service: General;  Laterality: Right;   CARDIAC CATHETERIZATION  04/03/2015   Procedure: Left Heart Cath and Cors/Grafts Angiography;  Surgeon: Peter M Swaz[place]and, MD;  Location: [facility] INVASIVE CV LAB;  Service: Cardiovascular;;   CORONARY ARTERY BYPASS GRAFT  1/12   history of ankle fractures requiring surgery     LUMBAR LAMINECTOMY/DECOMPRESSION MICRODISCECTOMY Left 04/15/2015   Procedure: LUMBAR LAMINECTOMY/DECOMPRESSION MICRODISCECTOMY 1 LEVEL;  Surgeon: Reyes Budge, MD;  Location: [facility] NEURO ORS;  Service: Neurosurgery;  Laterality: Left;  Left L23 microdiskectomy   LUMBAR LAMINECTOMY/DECOMPRESSION MICRODISCECTOMY N/A 05/27/2020   Procedure: L2-3, L3-4  decompression;  Surgeon: Barbarann Oneil BROCKS, MD;  Location: [facility] OR;  Service: Orthopedics;  Laterality: N/A;   MV repair and resection of mass  1/12   RESECTION DISTAL CLAVICAL Left 05/16/2019   Procedure: RESECTION DISTAL CLAVICAL;  Surgeon: Vernetta Lonni GRADE, MD;  Location: [facility];  Service: Orthopedics;  Laterality: Left;   SHOULDER ARTHROSCOPY WITH ROTATOR CUFF REPAIR AND SUBACROMIAL  DECOMPRESSION Left 05/16/2019   Procedure: SHOULDER ARTHROSCOPY WITH ROTATOR CUFF REPAIR AND SUBACROMIAL DECOMPRESSION;  Surgeon: Vernetta Lonni GRADE, MD;  Location: [facility];  Service: Orthopedics;  Laterality: Left;   TONSILLECTOMY  1971   TOTAL ABDOMINAL HYSTERECTOMY  1978   TRIGGER FINGER RELEASE Right 12/02/2021   Procedure: RIGHT RING FINGER A-1 PULLEY RELEASE;  Surgeon: Vernetta Lonni GRADE, MD;  Location: [facility];  Service: Orthopedics;  Laterality: Right;   TRIGGER FINGER RELEASE Left 05/11/2023   Procedure: LEFT INDEX FINGER RELEASE TRIGGER FINGER;  Surgeon: Vernetta Lonni GRADE, MD;  Location:  SURGERY CENTER;  Service: Orthopedics;  Laterality: Left;   VESICOVAGINAL FISTULA CLOSURE W/ TAH     Social History   Occupational History    Employer: UNEMPLOYED  Tobacco Use   Smoking status: Former    Current packs/day: 0.00    Types: Cigarettes    Quit date: 10/14/2010    Years since quitting: 13.2    Passive exposure: Never   Smokeless tobacco: Never  Vaping Use   Vaping status: Never Used  Substance and Sexual Activity   Alcohol use: Yes    Comment: social   Drug use: Not Currently    Types: Marijuana   Sexual activity: Not Currently

## 2024-01-12 NOTE — Progress Notes (Signed)
 Patient says that she twisted her knee on Saturday. She denies feeling or hearing a pop. She says that it feels worse today than when it happened and is having difficulty walking. She has tried various OTC topical treatments with very little temporary relief, and has taken Ibuprofen  with little relief.

## 2024-01-13 ENCOUNTER — Encounter: Payer: Self-pay | Admitting: Sports Medicine

## 2024-01-13 MED ORDER — METHYLPREDNISOLONE ACETATE 40 MG/ML IJ SUSP
40.0000 mg | INTRAMUSCULAR | Status: AC | PRN
  Administered 2024-01-12: 40 mg via INTRA_ARTICULAR

## 2024-01-13 MED ORDER — BUPIVACAINE HCL 0.25 % IJ SOLN
2.0000 mL | INTRAMUSCULAR | Status: AC | PRN
  Administered 2024-01-12: 2 mL via INTRA_ARTICULAR

## 2024-01-13 MED ORDER — LIDOCAINE HCL 1 % IJ SOLN
5.0000 mL | INTRAMUSCULAR | Status: AC | PRN
  Administered 2024-01-12: 5 mL

## 2024-01-17 DIAGNOSIS — I1A0 Resistant hypertension: Secondary | ICD-10-CM | POA: Diagnosis not present

## 2024-01-17 DIAGNOSIS — N1832 Chronic kidney disease, stage 3b: Secondary | ICD-10-CM | POA: Diagnosis not present

## 2024-01-17 DIAGNOSIS — E669 Obesity, unspecified: Secondary | ICD-10-CM | POA: Diagnosis not present

## 2024-01-17 DIAGNOSIS — I639 Cerebral infarction, unspecified: Secondary | ICD-10-CM | POA: Diagnosis not present

## 2024-01-17 DIAGNOSIS — I2510 Atherosclerotic heart disease of native coronary artery without angina pectoris: Secondary | ICD-10-CM | POA: Diagnosis not present

## 2024-01-17 DIAGNOSIS — E1122 Type 2 diabetes mellitus with diabetic chronic kidney disease: Secondary | ICD-10-CM | POA: Diagnosis not present

## 2024-01-18 ENCOUNTER — Other Ambulatory Visit: Payer: Self-pay | Admitting: Nurse Practitioner

## 2024-01-18 ENCOUNTER — Other Ambulatory Visit: Payer: Self-pay | Admitting: Neurology

## 2024-01-18 DIAGNOSIS — M19011 Primary osteoarthritis, right shoulder: Secondary | ICD-10-CM

## 2024-01-19 ENCOUNTER — Other Ambulatory Visit: Payer: Self-pay | Admitting: Internal Medicine

## 2024-01-19 DIAGNOSIS — I152 Hypertension secondary to endocrine disorders: Secondary | ICD-10-CM

## 2024-01-23 ENCOUNTER — Other Ambulatory Visit: Payer: Self-pay

## 2024-01-23 MED ORDER — GABAPENTIN 300 MG PO CAPS: 300.0000 mg | ORAL_CAPSULE | Freq: Three times a day (TID) | ORAL | 11 refills | Status: AC

## 2024-01-25 ENCOUNTER — Other Ambulatory Visit: Payer: Self-pay

## 2024-01-25 ENCOUNTER — Other Ambulatory Visit (HOSPITAL_COMMUNITY): Payer: Self-pay

## 2024-01-26 ENCOUNTER — Telehealth: Payer: Self-pay | Admitting: Sports Medicine

## 2024-01-26 NOTE — Telephone Encounter (Signed)
 Patient called. Says she is still having a lot of pain in her knee. Would like a call.

## 2024-01-29 ENCOUNTER — Other Ambulatory Visit: Payer: Self-pay

## 2024-01-30 ENCOUNTER — Ambulatory Visit: Payer: Medicare PPO

## 2024-02-02 ENCOUNTER — Telehealth: Payer: Self-pay | Admitting: Pharmacist

## 2024-02-02 NOTE — Telephone Encounter (Signed)
 Called pt to confirm appt. Pt will be present.

## 2024-02-05 ENCOUNTER — Other Ambulatory Visit (HOSPITAL_COMMUNITY): Payer: Self-pay

## 2024-02-05 ENCOUNTER — Ambulatory Visit: Attending: Family Medicine | Admitting: Pharmacist

## 2024-02-05 ENCOUNTER — Encounter: Payer: Self-pay | Admitting: Pharmacist

## 2024-02-05 ENCOUNTER — Other Ambulatory Visit: Payer: Self-pay

## 2024-02-05 DIAGNOSIS — Z7985 Long-term (current) use of injectable non-insulin antidiabetic drugs: Secondary | ICD-10-CM

## 2024-02-05 DIAGNOSIS — Z7984 Long term (current) use of oral hypoglycemic drugs: Secondary | ICD-10-CM

## 2024-02-05 DIAGNOSIS — Z794 Long term (current) use of insulin: Secondary | ICD-10-CM

## 2024-02-05 DIAGNOSIS — E1142 Type 2 diabetes mellitus with diabetic polyneuropathy: Secondary | ICD-10-CM | POA: Diagnosis not present

## 2024-02-05 MED ORDER — OZEMPIC (2 MG/DOSE) 8 MG/3ML ~~LOC~~ SOPN
2.0000 mg | PEN_INJECTOR | SUBCUTANEOUS | 2 refills | Status: AC
  Filled 2024-02-05 – 2024-02-28 (×3): qty 3, 28d supply, fill #0
  Filled 2024-03-22: qty 3, 28d supply, fill #1
  Filled 2024-04-02: qty 9, 84d supply, fill #1

## 2024-02-05 NOTE — Progress Notes (Signed)
 Pharmacy TNM Diabetes Measure Review  S:  Patient was identified in a report as being at risk for failing the True [place] Metric of A1c control (<8%) . Last A1c was 7.5% last month. Last PCP visit 11/24/23.  73 y.o. female who presents for diabetes evaluation, education, and management. Patient arrives in good spirits and presents without any assistance. Patient was referred and last seen by Primary Care Provider, Dr. Vicci, on 11/24/23.   PMH is significant for HTN, DM, HL, depression, chronic back pain, RT breast CA (intraductal CA, ER/PR+, lumpectomy and XRT - completed 08/2017), CKD stage 3 (recent AKI during hospitalization 09/12/23), CVA 2011 (residual weakness on LT side), CAD s/p CABG and MVR/resection of fibroelastoma, IDA.   Patient reports Diabetes is longstanding. She is doing better since adding Ozempic . Pharmacy is assisting with MAP for this. She denies any NV, abdominal pain, or changes in vision since increasing to the 1 mg weekly dose. She is adherent to Ozempic , Lantus  and Farxiga  as prescribed.  Family/Social History:  -Fhx: colon cancer, IBS, prostate cancer, cirrhosis, stroke, prostate cancer, diabetes, kidney disease, cancer -Smoking: none  Current diabetes medications include: Farxiga  5 mg daily, Lantus  6 units daily, Ozempic  1 mg weekly Patient reports adherence to taking all medications as prescribed.  Insurance coverage: [employer]  Patient denies hypoglycemic events. Reported blood sugars: 90s - low 100s.   Patient denies polyuria. Patient reports neuropathy (nerve pain). Patient denies visual changes. Patient reports self foot exams.   Patient reported dietary habits: Eats 1-2 meals/day -Will often eat a smaller breakfast/snack in the morning.  -Eats dinner -Notes a decreased appetite  O:   Lab Results  Component Value Date   HGBA1C 7.5 (A) 01/02/2024   There were no vitals filed for this visit.  Lipid Panel     Component Value Date/Time   CHOL 175  09/28/2023 1009   TRIG 72 09/28/2023 1009   HDL 100 09/28/2023 1009   CHOLHDL 1.8 09/28/2023 1009   CHOLHDL 2.8 04/28/2016 1028   VLDL 19 04/28/2016 1028   LDLCALC 62 09/28/2023 1009   LDLDIRECT 101.7 07/18/2011 0901    Clinical Atherosclerotic Cardiovascular Disease (ASCVD): Yes  The ASCVD Risk score (Arnett DK, et al., 2019) failed to calculate for the following reasons:   Risk score cannot be calculated because patient has a medical history suggesting prior/existing ASCVD   Patient is participating in a Managed Medicaid Plan: No   A/P: Diabetes longstanding currently close to goal based on A1c of 7.5% last month. Commended her for this! Reported home sugars are at goal. Commended her for this! Patient is not symptomatic at this time but is able to verbalize appropriate hypoglycemia management plan. Medication adherence appears to be optimal. -Increase Ozempic  to 2 mg weekly. -Stop Lantus .  -Continue Farxiga  5 mg once daily.  -Patient educated on purpose, proper use, and potential adverse effects of Ozempic .  -Extensively discussed pathophysiology of diabetes, recommended lifestyle interventions, dietary effects on blood sugar control.  -Counseled on s/sx of and management of hypoglycemia.  -Next A1c anticipated 03/2024.   Follow-up:  Pharmacist in 4 weeks. PCP: 04/02/2024  Herlene Fleeta Morris, PharmD, BCACP, CPP Clinical Pharmacist [facility] [phone]

## 2024-02-06 ENCOUNTER — Ambulatory Visit: Attending: Internal Medicine

## 2024-02-06 ENCOUNTER — Other Ambulatory Visit: Payer: Self-pay

## 2024-02-06 VITALS — Ht 68.0 in | Wt 260.0 lb

## 2024-02-06 DIAGNOSIS — Z0000 Encounter for general adult medical examination without abnormal findings: Secondary | ICD-10-CM | POA: Diagnosis not present

## 2024-02-06 NOTE — Patient Instructions (Signed)
 [name] , Thank you for taking time out of your busy schedule to complete your Annual Wellness Visit with me. I enjoyed our conversation and look forward to speaking with you again next year. I, as well as your care team,  appreciate your ongoing commitment to your health goals. Please review the following plan we discussed and let me know if I can assist you in the future. Your Game plan/ To Do List    Referrals: If you haven't heard from the office you've been referred to, please reach out to them at the phone provided.   Follow up Visits: We will see or speak with you next year for your Next Medicare AWV with our clinical staff Have you seen your provider in the last 6 months (3 months if uncontrolled diabetes)? Yes  Clinician Recommendations:  Aim for 30 minutes of exercise or brisk walking, 6-8 glasses of water, and 5 servings of fruits and vegetables each day.       This is a list of the screenings recommended for you:  Health Maintenance  Topic Date Due   COVID-19 Vaccine (3 - Pfizer risk series) 09/03/2019   Flu Shot  01/26/2024   Colon Cancer Screening  02/21/2024   DTaP/Tdap/Td vaccine (2 - Td or Tdap) 03/06/2024   Complete foot exam   03/13/2024   Hemoglobin A1C  07/04/2024   Mammogram  10/31/2024   Eye exam for diabetics  11/12/2024   Yearly kidney function blood test for diabetes  11/23/2024   Yearly kidney health urinalysis for diabetes  11/23/2024   Medicare Annual Wellness Visit  02/05/2025   Pneumococcal Vaccine for age over 109  Completed   DEXA scan (bone density measurement)  Completed   Hepatitis C Screening  Completed   Zoster (Shingles) Vaccine  Completed   Hepatitis B Vaccine  Aged Out   HPV Vaccine  Aged Out   Meningitis B Vaccine  Aged Out    Advanced directives: (Declined) Advance directive discussed with you today. Even though you declined this today, please call our office should you change your mind, and we can give you the proper paperwork for  you to fill out. Advance Care Planning is important because it:  [x]  Makes sure you receive the medical care that is consistent with your values, goals, and preferences  [x]  It provides guidance to your family and loved ones and reduces their decisional burden about whether or not they are making the right decisions based on your wishes.  Follow the link provided in your after visit summary or read over the paperwork we have mailed to you to help you started getting your Advance Directives in place. If you need assistance in completing these, please reach out to us  so that we can help you!  See attachments for Preventive Care and Fall Prevention Tips.

## 2024-02-06 NOTE — Progress Notes (Signed)
 Because this visit was a virtual/telehealth visit,  certain criteria was not obtained, such a blood pressure, CBG if applicable, and timed get up and go. Any medications not marked as taking were not mentioned during the medication reconciliation part of the visit. Any vitals not documented were not able to be obtained due to this being a telehealth visit or patient was unable to self-report a recent blood pressure reading due to a lack of equipment at home via telehealth. Vitals that have been documented are verbally provided by the patient.   Subjective:   [name] is a 73 y.o. who presents for a Medicare Wellness preventive visit.  As a reminder, Annual Wellness Visits don't include a physical exam, and some assessments may be limited, especially if this visit is performed virtually. We may recommend an in-person follow-up visit with your provider if needed.  Visit Complete: Virtual I connected with  [name] on 02/06/24 by a audio enabled telemedicine application and verified that I am speaking with the correct person using two identifiers.  Patient Location: Home  Provider Location: Home Office  I discussed the limitations of evaluation and management by telemedicine. The patient expressed understanding and agreed to proceed.  Vital Signs: Because this visit was a virtual/telehealth visit, some criteria may be missing or patient reported. Any vitals not documented were not able to be obtained and vitals that have been documented are patient reported.  VideoDeclined- This patient declined Librarian, academic. Therefore the visit was completed with audio only.  Persons Participating in Visit: Patient.  AWV Questionnaire: No: Patient Medicare AWV questionnaire was not completed prior to this visit.  Cardiac Risk Factors include: advanced age (>49men, >65 women);sedentary lifestyle;diabetes mellitus;hypertension;obesity (BMI >30kg/m2);family  history of premature cardiovascular disease     Objective:    Today's Vitals   02/06/24 1618 02/06/24 1622  Weight: 260 lb (117.9 kg)   Height: 5' 8 (1.727 m)   PainSc: 8  8   PainLoc: Knee    Body mass index is 39.53 kg/m.     02/06/2024    4:20 PM 11/09/2023    2:47 PM 06/26/2023    2:19 PM 06/08/2023    5:48 AM 06/01/2023    1:59 PM 05/11/2023    6:30 AM 01/24/2023    1:21 PM  Advanced Directives  Does Patient Have a Medical Advance Directive? No No No No No No No  Would patient like information on creating a medical advance directive? No - Patient declined No - Patient declined No - Patient declined No - Patient declined  No - Patient declined Yes (MAU/Ambulatory/Procedural Areas - Information given)    Current Medications (verified) Outpatient Encounter Medications as of 02/06/2024  Medication Sig   albuterol  (VENTOLIN  HFA) 108 (90 Base) MCG/ACT inhaler Inhale 2 puffs into the lungs every 6 (six) hours as needed for wheezing or shortness of breath.   amLODipine  (NORVASC ) 10 MG tablet TAKE 1 TABLET EVERY DAY   aspirin  EC 81 MG tablet Take 1 tablet (81 mg total) by mouth daily.   atorvastatin  (LIPITOR ) 80 MG tablet TAKE 1 TABLET EVERY DAY   buPROPion  (WELLBUTRIN  XL) 150 MG 24 hr tablet Take 1 tablet (150 mg total) by mouth 2 (two) times daily.   dapagliflozin  propanediol (FARXIGA ) 5 MG TABS tablet Take 1 tablet (5 mg total) by mouth daily before breakfast.   diclofenac  Sodium (VOLTAREN ) 1 % GEL APPLY 2 GRAMS TOPICALLY 4 (FOUR) TIMES DAILY AS NEEDED.  docusate sodium  (COLACE) 100 MG capsule Take 100 mg by mouth daily as needed for mild constipation. (Patient not taking: Reported on 11/24/2023)   DULoxetine  (CYMBALTA ) 30 MG capsule TAKE 1 CAPSULE EVERY DAY   FEROSUL 325 (65 Fe) MG tablet Take 325 mg by mouth daily with breakfast.   furosemide  (LASIX ) 20 MG tablet Take 1.5 tablets (30 mg total) by mouth in the morning.   gabapentin  (NEURONTIN ) 300 MG capsule Take 1 capsule (300  mg total) by mouth 3 (three) times daily.   hydrALAZINE  (APRESOLINE ) 100 MG tablet Take 1 tablet (100 mg total) by mouth 3 (three) times daily.   Insulin  Pen Needle (PEN NEEDLES) 31G X 8 MM MISC UAD   letrozole  (FEMARA ) 2.5 MG tablet Take 1 tablet (2.5 mg total) by mouth daily with lunch.   methocarbamol  (ROBAXIN ) 500 MG tablet TAKE 1 TABLET EVERY 8 HOURS AS NEEDED FOR MUSCLE SPASMS.   Multiple Vitamin (MULTIVITAMIN WITH MINERALS) TABS tablet Take 1 tablet by mouth daily with breakfast.   pantoprazole  (PROTONIX ) 40 MG tablet Take 40 mg by mouth daily.   polyethylene glycol powder (GLYCOLAX /MIRALAX ) 17 GM/SCOOP powder Take 17 g by mouth daily as needed for mild constipation. (Patient not taking: Reported on 10/09/2023)   Semaglutide , 2 MG/DOSE, (OZEMPIC , 2 MG/DOSE,) 8 MG/3ML SOPN Inject 2 mg into the skin once a week.   traMADol  (ULTRAM ) 50 MG tablet Take 1 tablet (50 mg total) by mouth every 12 (twelve) hours as needed (for pain).   traZODone  (DESYREL ) 50 MG tablet Take 1 tablet (50 mg total) by mouth at bedtime as needed. for sleep   valsartan  (DIOVAN ) 40 MG tablet Take 1 tablet (40 mg total) by mouth daily.   [DISCONTINUED] amLODipine  (NORVASC ) 10 MG tablet TAKE 1 TABLET EVERY DAY   [DISCONTINUED] DULoxetine  (CYMBALTA ) 30 MG capsule Take 1 capsule (30 mg total) by mouth daily.   [DISCONTINUED] gabapentin  (NEURONTIN ) 300 MG capsule Take 1 capsule (300 mg total) by mouth 3 (three) times daily.   [DISCONTINUED] insulin  glargine (LANTUS ) 100 UNIT/ML Solostar Pen Inject 10 Units into the skin at bedtime.   [DISCONTINUED] Semaglutide , 1 MG/DOSE, 4 MG/3ML SOPN Inject 1 mg as directed once a week.   No facility-administered encounter medications on file as of 02/06/2024.    Allergies (verified) Coreg  [carvedilol ]   History: Past Medical History:  Diagnosis Date   Abscess in epidural space of L2-L5 lumbar spine 03/2006   Anemia, iron  deficiency    Arthritis    Breast cancer (HCC) 06/12/2017    right breast   CAD (coronary artery disease), native coronary artery 03/30/2015   Cataract    Colon polyp    a. Multiple colonic polyps status post colonoscopy in October 2007, consistent with tubular adenoma, tubulovillous adenoma with no high-grade dysplasia or malignancy identified.    Constipation    Coronary artery disease    a. s/p CABG 06/2010: S-D2; S-PDA (at time of MV surgery).   Depression    Diabetes mellitus    type II   Family history of breast cancer    Family history of prostate cancer    Genetic testing 09/25/2017   Multi-Cancer panel (83 genes) @ [facility] - No pathogenic mutations detected   GERD (gastroesophageal reflux disease)    Hx of transient ischemic attack (TIA)    a. See stroke section.   Hyperlipidemia    Hypertension    Hypoxia    a. Has history of acute hypoxic respiratory failure in the setting of  bronchitis/PNA or prior admissions.   MRSA infection    a. History of recurrent skin infection and soft tissue abscesses, with MRSA positive in the past.   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 mitral valve - s/p resection and MV repair 06/2010 Dr. Dusty.   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 Distal R MCA.   Stroke [facility])    a. 11/2009: mitral mass diagnosed at this time, also has distal R MCA stenosis, tx with coumadin . b. Readmitted 05/2010 with TIA symptoms - had not been taking Coumadin . s/p MV surgery 06/2010. Coumadin  stopped 2013 after review of chart by Dr. Pietro since mass was removed (stroke felt possibly related to this).    Past Surgical History:  Procedure Laterality Date   BREAST BIOPSY Right 08/26/2019   benign   BREAST LUMPECTOMY Right    BREAST LUMPECTOMY WITH RADIOACTIVE SEED AND SENTINEL LYMPH NODE BIOPSY Right 07/20/2017   Procedure: RIGHT BREAST LUMPECTOMY WITH RADIOACTIVE SEED AND SENTINEL LYMPH NODE  BIOPSY;  Surgeon: Ethyl Lenis, MD;  Location: [facility] OR;  Service: General;  Laterality: Right;   CARDIAC CATHETERIZATION  04/03/2015   Procedure: Left Heart Cath and Cors/Grafts Angiography;  Surgeon: Peter M Swaziland, MD;  Location: [facility] INVASIVE CV LAB;  Service: Cardiovascular;;   CORONARY ARTERY BYPASS GRAFT  1/12   history of ankle fractures requiring surgery     LUMBAR LAMINECTOMY/DECOMPRESSION MICRODISCECTOMY Left 04/15/2015   Procedure: LUMBAR LAMINECTOMY/DECOMPRESSION MICRODISCECTOMY 1 LEVEL;  Surgeon: Reyes Budge, MD;  Location: [facility] NEURO ORS;  Service: Neurosurgery;  Laterality: Left;  Left L23 microdiskectomy   LUMBAR LAMINECTOMY/DECOMPRESSION MICRODISCECTOMY N/A 05/27/2020   Procedure: L2-3, L3-4  decompression;  Surgeon: Barbarann Oneil BROCKS, MD;  Location: [facility] OR;  Service: Orthopedics;  Laterality: N/A;   MV repair and resection of mass  1/12   RESECTION DISTAL CLAVICAL Left 05/16/2019   Procedure: RESECTION DISTAL CLAVICAL;  Surgeon: Vernetta Lonni GRADE, MD;  Location: [facility];  Service: Orthopedics;  Laterality: Left;   SHOULDER ARTHROSCOPY WITH ROTATOR CUFF REPAIR AND SUBACROMIAL DECOMPRESSION Left 05/16/2019   Procedure: SHOULDER ARTHROSCOPY WITH ROTATOR CUFF REPAIR AND SUBACROMIAL DECOMPRESSION;  Surgeon: Vernetta Lonni GRADE, MD;  Location: [facility];  Service: Orthopedics;  Laterality: Left;   TONSILLECTOMY  1971   TOTAL ABDOMINAL HYSTERECTOMY  1978   TRIGGER FINGER RELEASE Right 12/02/2021   Procedure: RIGHT RING FINGER A-1 PULLEY RELEASE;  Surgeon: Vernetta Lonni GRADE, MD;  Location: [facility];  Service: Orthopedics;  Laterality: Right;   TRIGGER FINGER RELEASE Left 05/11/2023   Procedure: LEFT INDEX FINGER RELEASE TRIGGER FINGER;  Surgeon: Vernetta Lonni GRADE, MD;  Location: [facility];  Service: Orthopedics;  Laterality: Left;   VESICOVAGINAL FISTULA CLOSURE W/ TAH     Family History  Problem Relation  Age of Onset   Colon cancer Mother        dx 103s; deceased 42   Irritable bowel syndrome Mother    Prostate cancer Father        deceased 16s   Cirrhosis Sister        died of GI bleed associated with cirrhosis of the liver   Stroke Brother    Prostate cancer Brother 12       deceased 33   Diabetes Brother  x3   Kidney disease Brother    Cancer Brother 21       unk. type   Breast cancer Cousin        several maternal 1st and 2nd cousins with breast cancer   Rectal cancer Neg Hx    Stomach cancer Neg Hx    Esophageal cancer Neg Hx    Social History   Socioeconomic History   Marital status: Married    Spouse name: Not on file   Number of children: 3   Years of education: some college   Highest education level: Not on file  Occupational History    Employer: UNEMPLOYED  Tobacco Use   Smoking status: Former    Current packs/day: 0.00    Types: Cigarettes    Quit date: 10/14/2010    Years since quitting: 13.3    Passive exposure: Never   Smokeless tobacco: Never  Vaping Use   Vaping status: Never Used  Substance and Sexual Activity   Alcohol use: Yes    Comment: social   Drug use: Not Currently    Types: Marijuana   Sexual activity: Not Currently  Other Topics Concern   Not on file  Social History Narrative   Lives with husband, stay at home, uses cane occasionally, still active/ambulatory.    Left-handed.   No daily caffeine use.   Social Drivers of Corporate investment banker Strain: Low Risk  (02/06/2024)   Overall Financial Resource Strain (CARDIA)    Difficulty of Paying Living Expenses: Not hard at all  Food Insecurity: No Food Insecurity (02/06/2024)   Hunger Vital Sign    Worried About Running Out of Food in the Last Year: Never true    Ran Out of Food in the Last Year: Never true  Transportation Needs: No Transportation Needs (02/06/2024)   PRAPARE - Administrator, Civil Service (Medical): No    Lack of Transportation (Non-Medical):  No  Physical Activity: Inactive (02/06/2024)   Exercise Vital Sign    Days of Exercise per Week: 0 days    Minutes of Exercise per Session: 0 min  Stress: No Stress Concern Present (02/06/2024)   [employer] of Occupational Health - Occupational Stress Questionnaire    Feeling of Stress: Not at all  Social Connections: Socially Integrated (02/06/2024)   Social Connection and Isolation Panel    Frequency of Communication with Friends and Family: More than three times a week    Frequency of Social Gatherings with Friends and Family: Three times a week    Attends Religious Services: More than 4 times per year    Active Member of Clubs or Organizations: Yes    Attends Engineer, structural: More than 4 times per year    Marital Status: Married    Tobacco Counseling Counseling given: Not Answered    Clinical Intake:  Pre-visit preparation completed: Yes  Pain : 0-10 Pain Score: 8  Pain Type: Acute pain Pain Location: Knee Pain Orientation: Right Pain Relieving Factors: BIOFREEZE, ARTHRITIS TYLENOL , GABAPENTIN  300MG , MUSCLE RELAXER  Pain Relieving Factors: BIOFREEZE, ARTHRITIS TYLENOL , GABAPENTIN  300MG , MUSCLE RELAXER  BMI - recorded: 39.53 Nutritional Risks: None Diabetes: Yes CBG done?: No Did pt. bring in CBG monitor from home?: No  Lab Results  Component Value Date   HGBA1C 7.5 (A) 01/02/2024   HGBA1C 8.5 (H) 06/27/2023   HGBA1C 7.3 (A) 03/14/2023     How often do you need to have someone help you when you read  instructions, pamphlets, or other written materials from your doctor or pharmacy?: 1 - Never  Interpreter Needed?: No  Information entered by :: Roz Fuller, LPN.   Activities of Daily Living     02/06/2024    4:28 PM 06/27/2023    7:00 PM  In your present state of health, do you have any difficulty performing the following activities:  Hearing? 0 0  Vision? 0 0  Difficulty concentrating or making decisions? 0 0  Comment BSE: AVID  READER   Walking or climbing stairs? 0   Dressing or bathing? 0   Doing errands, shopping? 0 0  Preparing Food and eating ? N   Using the Toilet? N   In the past six months, have you accidently leaked urine? N   Do you have problems with loss of bowel control? N   Managing your Medications? N   Managing your Finances? N   Housekeeping or managing your Housekeeping? N     Patient Care Team: Vicci Barnie NOVAK, MD as PCP - General (Internal Medicine) Shlomo Wilbert SAUNDERS, MD as PCP - Cardiology (Cardiology) Odean Potts, MD as Consulting Physician (Hematology and Oncology) Dewey Rush, MD as Consulting Physician (Radiation Oncology) Crawford, Morna Pickle, NP as Nurse Practitioner (Hematology and Oncology) Macel Jayson PARAS, MD as Consulting Physician (Internal Medicine) Onita Duos, MD as Consulting Physician (Neurology) [facility] as Consulting Physician (Ophthalmology)  I have updated your Care Teams any recent Medical Services you may have received from other providers in the past year.     Assessment:   This is a routine wellness examination for Janya.  Hearing/Vision screen Hearing Screening - Comments:: Denies hearing difficulties.  Vision Screening - Comments:: Wears rx glasses - up to date with routine eye exams with [facility] Clem [facility]    Goals Addressed             This Visit's Progress    02/06/24: To lose weight, increase physical activity and get off some of these medications.         Depression Screen     02/06/2024    4:32 PM 11/24/2023    3:35 PM 10/09/2023   10:41 AM 07/17/2023    3:20 PM 03/14/2023    2:52 PM 01/24/2023    1:18 PM 11/07/2022    3:06 PM  PHQ 2/9 Scores  PHQ - 2 Score 0 0 0 0 0 0 0  PHQ- 9 Score 0 1  2 3  2     Fall Risk     02/06/2024    4:28 PM 11/24/2023    3:35 PM 10/18/2023   11:22 AM 10/09/2023   10:32 AM 01/24/2023    1:20 PM  Fall Risk   Falls in the past year? 0 0 0 0 0  Number falls in  past yr: 0 0   0  Injury with Fall? 0 0   0  Risk for fall due to : No Fall Risks No Fall Risks Impaired mobility  Impaired mobility  Follow up Falls evaluation completed Falls evaluation completed Falls evaluation completed;Falls prevention discussed  Falls prevention discussed;Education provided;Falls evaluation completed    MEDICARE RISK AT HOME:  Medicare Risk at Home Any stairs in or around the home?: Yes (15 STEPS) If so, are there any without handrails?: No Home free of loose throw rugs in walkways, pet beds, electrical cords, etc?: Yes Adequate lighting in your home to reduce risk of falls?: Yes Life alert?:  No Use of a cane, walker or w/c?: Yes Grab bars in the bathroom?: No Shower chair or bench in shower?: Yes Elevated toilet seat or a handicapped toilet?: Yes (RISER ON TOLIET, GRAB NEAR)  TIMED UP AND GO:  Was the test performed?  No  Cognitive Function: Declined/Normal: No cognitive concerns noted by patient or family. Patient alert, oriented, able to answer questions appropriately and recall recent events. No signs of memory loss or confusion.    02/06/2024    4:21 PM 03/29/2021   10:02 AM 01/20/2020    4:53 PM  MMSE - Mini Mental State Exam  Not completed: Unable to complete    Orientation to time  5 5  Orientation to Place  5 5  Registration  3 3  Attention/ Calculation  0 5  Recall  3 2  Language- name 2 objects  2 2  Language- repeat  1 1  Language- follow 3 step command  3 3  Language- read & follow direction  1 1  Write a sentence  0 1  Copy design  0 1  Total score  23 29        02/06/2024    4:21 PM 01/24/2023    1:21 PM 04/15/2022   12:10 PM  6CIT Screen  What Year? 0 points 0 points 0 points  What month? 0 points 0 points 0 points  What time? 0 points 0 points 0 points  Count back from 20 0 points 0 points 0 points  Months in reverse 0 points 0 points 0 points  Repeat phrase 0 points 0 points 0 points  Total Score 0 points 0 points 0 points     Immunizations Immunization History  Administered Date(s) Administered   Fluad Quad(high Dose 65+) 04/11/2022   Fluad Trivalent(High Dose 65+) 03/14/2023   Influenza,inj,Quad PF,6+ Mos 06/26/2014, 03/26/2015, 04/24/2017, 04/06/2018, 04/19/2019, 03/16/2020, 03/29/2021   PFIZER(Purple Top)SARS-COV-2 Vaccination 07/16/2019, 08/06/2019   Pneumococcal Conjugate-13 04/24/2017   Pneumococcal Polysaccharide-23 08/10/2018   Tdap 03/06/2014   Zoster Recombinant(Shingrix) 01/20/2020, 03/23/2020    Screening Tests Health Maintenance  Topic Date Due   COVID-19 Vaccine (3 - Pfizer risk series) 09/03/2019   INFLUENZA VACCINE  01/26/2024   Colonoscopy  02/21/2024   DTaP/Tdap/Td (2 - Td or Tdap) 03/06/2024   FOOT EXAM  03/13/2024   HEMOGLOBIN A1C  07/04/2024   MAMMOGRAM  10/31/2024   OPHTHALMOLOGY EXAM  11/12/2024   Diabetic kidney evaluation - eGFR measurement  11/23/2024   Diabetic kidney evaluation - Urine ACR  11/23/2024   Medicare Annual Wellness (AWV)  02/05/2025   Pneumococcal Vaccine: 50+ Years  Completed   DEXA SCAN  Completed   Hepatitis C Screening  Completed   Zoster Vaccines- Shingrix  Completed   Hepatitis B Vaccines  Aged Out   HPV VACCINES  Aged Out   Meningococcal B Vaccine  Aged Out    Health Maintenance  Health Maintenance Due  Topic Date Due   COVID-19 Vaccine (3 - Pfizer risk series) 09/03/2019   INFLUENZA VACCINE  01/26/2024   Colonoscopy  02/21/2024   Health Maintenance Items Addressed: Yes  Patient is due for the following care gaps: Colonoscopy, Covid-19 and Flu vaccines.   Additional Screening:  Vision Screening: Recommended annual ophthalmology exams for early detection of glaucoma and other disorders of the eye. Would you like a referral to an eye doctor? No    Dental Screening: Recommended annual dental exams for proper oral hygiene  Community Resource Referral / Chronic  Care Management: CRR required this visit?  No   CCM required this  visit?  No   Plan:    I have personally reviewed and noted the following in the patient's chart:   Medical and social history Use of alcohol, tobacco or illicit drugs  Current medications and supplements including opioid prescriptions. Patient is not currently taking opioid prescriptions. Functional ability and status Nutritional status Physical activity Advanced directives List of other physicians Hospitalizations, surgeries, and ER visits in previous 12 months Vitals Screenings to include cognitive, depression, and falls Referrals and appointments  In addition, I have reviewed and discussed with patient certain preventive protocols, quality metrics, and best practice recommendations. A written personalized care plan for preventive services as well as general preventive health recommendations were provided to patient.   Roz LOISE Fuller, LPN   [id number]   After Visit Summary: (MyChart) Due to this being a telephonic visit, the after visit summary with patients personalized plan was offered to patient via MyChart   Notes: Patient would like to discuss colonoscopy with provider.

## 2024-02-08 ENCOUNTER — Ambulatory Visit: Admitting: Sports Medicine

## 2024-02-08 ENCOUNTER — Encounter: Payer: Self-pay | Admitting: Sports Medicine

## 2024-02-08 ENCOUNTER — Other Ambulatory Visit: Payer: Self-pay

## 2024-02-08 DIAGNOSIS — M25561 Pain in right knee: Secondary | ICD-10-CM

## 2024-02-08 DIAGNOSIS — M23300 Other meniscus derangements, unspecified lateral meniscus, right knee: Secondary | ICD-10-CM | POA: Diagnosis not present

## 2024-02-08 DIAGNOSIS — M1711 Unilateral primary osteoarthritis, right knee: Secondary | ICD-10-CM

## 2024-02-08 DIAGNOSIS — M25461 Effusion, right knee: Secondary | ICD-10-CM | POA: Diagnosis not present

## 2024-02-08 DIAGNOSIS — G8929 Other chronic pain: Secondary | ICD-10-CM

## 2024-02-08 DIAGNOSIS — E1142 Type 2 diabetes mellitus with diabetic polyneuropathy: Secondary | ICD-10-CM | POA: Diagnosis not present

## 2024-02-08 MED ORDER — TRAMADOL HCL 50 MG PO TABS: 50.0000 mg | ORAL_TABLET | Freq: Four times a day (QID) | ORAL | 0 refills | Status: DC | PRN

## 2024-02-08 NOTE — Progress Notes (Signed)
 Patient says that she did not get any relief from the aspiration/injection. She says that she is unable to rest well due to her pain, and she is unable to go from seated to standing without pain. She also says that she has to take her time when turning and walking due to the pain in her knee. She has tried using her compression sleeve and wrap, although her knee has gotten so swollen at times that the sleeve will not fit over it. She says that she puts a pillow under the knee when she is resting, and that seems to give her a small amount of relief.

## 2024-02-08 NOTE — Progress Notes (Addendum)
 [name] - 73 y.o. female MRN [id number]  Date of birth: January 24, 1951  Office Visit Note: Visit Date: 02/08/2024 PCP: Stacy Barnie NOVAK, MD Referred by: Stacy Barnie NOVAK, MD  Subjective: Chief Complaint  Patient presents with   Right Knee - Follow-up   HPI: [name] is a pleasant 73 y.o. female who presents today for acute on chronic right knee pain.  [name] presents with acute on chronic right knee pain.  I saw her back 1 month ago as she had a large effusion on the knee after a twisting injury.  We did proceed with ultrasound-guided aspiration and subsequent injection.  [name] tells me today that she had only mild relief for the first few days but her pain certainly never went away and now it is back just as bad if not even worse.  She does have pain that continues over the lateral aspect of the knee joint.  Her pain is interfering with her activities of daily living and keeping her up at night from sleeping.  She has tried a compression sleeve wrap as well as gabapentin  300 mg, muscle relaxer and Tylenol  without much relief.  She does ambulate with the assistance of a cane.  She is unable to take NSAIDs given her CKD as well as coronary artery disease.  She is a diabetic and does check her sugars, needs to limit prednisone  based medications.  In the past, her primary physician did write her for a short prescription of tramadol  which did help her overall joint pains, last written on 11/29/2023.  She currently does not have any of this medication.  Lab Results  Component Value Date   HGBA1C 7.5 (A) 01/02/2024   Pertinent ROS were reviewed with the patient and found to be negative unless otherwise specified above in HPI.   Assessment & Plan: Visit Diagnoses:  1. Chronic pain of right knee   2. Unilateral primary osteoarthritis, right knee   3. Degenerative tear of lateral meniscus of right knee   4. Effusion, right knee   5. DM type 2 with diabetic peripheral neuropathy  (HCC)    Plan: Impression is acute on chronic right knee pain with recurrent joint effusion.  I do think her pain is multifactorial as she does have rather advanced lateral joint line arthritic change and narrowing, but her symptoms as well as bedside ultrasound do show likely a lateral as well as likely medial meniscus tear with extrusion.  I am a little surprised she did not receive further relief from previous ultrasound-guided knee aspiration and corticosteroid injection.  Given this, I do have a concern for possible insufficiency fracture versus extruded/displaced meniscal tear.  Given this, I would like to move forward with MRI of the knee to further evaluate.  She is in quite considerable pain, and we are limited on medication options given her CAD and unable to take NSAIDs as well as her diabetes with prednisone .  Given this, I am okay with putting her on a short course of tramadol  50 mg to take as needed for her breakthrough pain.  She is aware this is not a long-term medication.  She may continue her gabapentin  300 mg 3 times daily and her muscle relaxer/Tylenol  as needed in addition.  Once MRI results, I will review this and reach out to her to discuss possible next steps.  She is interested in considering gel injection, we will keep this in consideration and decide further once her MRI returns.  Follow-up: Return  for will mychart message once MRI returns to discuss next steps.   Meds & Orders:  Meds ordered this encounter  Medications   traMADol  (ULTRAM ) 50 MG tablet    Sig: Take 1 tablet (50 mg total) by mouth every 6 (six) hours as needed.    Dispense:  25 tablet    Refill:  0    Orders Placed This Encounter  Procedures   US  Extrem Low Right Ltd   MR Knee Right w/o contrast     Procedures: No procedures performed      Clinical History: No specialty comments available.  She reports that she quit smoking about 13 years ago. Her smoking use included cigarettes. She has never  been exposed to tobacco smoke. She has never used smokeless tobacco.  Recent Labs    03/14/23 1424 06/27/23 1423 01/02/24 1426  HGBA1C 7.3* 8.5* 7.5*    Objective:    Physical Exam  Gen: Well-appearing, in no acute distress; non-toxic CV: Well-perfused. Warm.  Resp: Breathing unlabored on room air; no wheezing. Psych: Fluid speech in conversation; appropriate affect; normal thought process  Ortho Exam - Right knee: There is an antalgic gait.  The right knee has a mild to moderate effusion present without significant warmth.  There is lateral joint line tenderness to palpation.  There is pain with endrange flexion.  There is a degree of horizontal extremity/instability and pseudo instability with varus/valgus stress testing.   Imaging: No results found.  Past Medical/Family/Surgical/Social History: Medications & Allergies reviewed per EMR, new medications updated. Patient Active Problem List   Diagnosis Date Noted   Pneumonia 06/26/2023   Colonic mass 06/08/2023   Chronic right shoulder pain 03/28/2023   Neck pain 03/28/2023   Primary osteoarthritis of right shoulder 08/10/2022   CAP (community acquired pneumonia) 06/22/2022   Stage 3b chronic kidney disease (CKD) (HCC) 06/22/2022   Trigger finger, left index finger 12/02/2021   DM type 2 with diabetic peripheral neuropathy (HCC) 07/26/2021   Bilateral low back pain with bilateral sciatica 07/26/2021   Gait abnormality 07/26/2021   Controlled substance agreement signed 12/31/2020   Shoulder stiffness, left 07/07/2020   Status post lumbar spine surgery for decompression of spinal cord 06/09/2020   Lumbar stenosis 05/27/2020   Spinal stenosis of lumbar region 05/21/2019   Complete tear of left rotator cuff 05/16/2019   Uncontrolled type 2 diabetes mellitus with hyperglycemia (HCC) 12/19/2018   Microalbuminuria 04/08/2018   Genetic testing 09/25/2017   Family history of breast cancer    Family history of prostate cancer     Malignant neoplasm of upper-outer quadrant of right breast in female, estrogen receptor positive (HCC) 06/26/2017   Lumbar herniated disc 04/15/2015   Abnormal nuclear stress test 04/03/2015   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   CAD (coronary artery disease), native coronary artery 03/30/2015   COPD (chronic obstructive pulmonary disease) (HCC) 12/24/2013   S/P mitral valve repair 11/16/2010   Pure hypercholesterolemia 06/29/2010   Anemia of renal disease 09/29/2006   Essential hypertension 09/29/2006   Diabetes mellitus without complication (HCC) 09/29/2006   Past Medical History:  Diagnosis Date   Abscess in epidural space of L2-L5 lumbar spine 03/2006   Anemia, iron  deficiency    Arthritis    Breast cancer (HCC) 06/12/2017   right breast   CAD (coronary artery disease), native coronary artery 03/30/2015   Cataract    Colon polyp    a. Multiple colonic polyps status post colonoscopy in October 2007, consistent with tubular  adenoma, tubulovillous adenoma with no high-grade dysplasia or malignancy identified.    Constipation    Coronary artery disease    a. s/p CABG 06/2010: S-D2; S-PDA (at time of MV surgery).   Depression    Diabetes mellitus    type II   Family history of breast cancer    Family history of prostate cancer    Genetic testing 09/25/2017   Multi-Cancer panel (83 genes) @ [facility] - No pathogenic mutations detected   GERD (gastroesophageal reflux disease)    Hx of transient ischemic attack (TIA)    a. See stroke section.   Hyperlipidemia    Hypertension    Hypoxia    a. Has history of acute hypoxic respiratory failure in the setting of bronchitis/PNA or prior admissions.   MRSA infection    a. History of recurrent skin infection and soft tissue abscesses, with MRSA positive in the past.   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 mitral valve - s/p resection and MV repair 06/2010 Dr. Dusty.   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 Distal R MCA.   Stroke [facility])    a. 11/2009: mitral mass diagnosed at this time, also has distal R MCA stenosis, tx with coumadin . b. Readmitted 05/2010 with TIA symptoms - had not been taking Coumadin . s/p MV surgery 06/2010. Coumadin  stopped 2013 after review of chart by Dr. Pietro since mass was removed (stroke felt possibly related to this).    Family History  Problem Relation Age of Onset   Colon cancer Mother        dx 57s; deceased 85   Irritable bowel syndrome Mother    Prostate cancer Father        deceased 29s   Cirrhosis Sister        died of GI bleed associated with cirrhosis of the liver   Stroke Brother    Prostate cancer Brother 28       deceased 51   Diabetes Brother        x3   Kidney disease Brother    Cancer Brother 43       unk. type   Breast cancer Cousin        several maternal 1st and 2nd cousins with breast cancer   Rectal cancer Neg Hx    Stomach cancer Neg Hx    Esophageal cancer Neg Hx    Past Surgical History:  Procedure Laterality Date   BREAST BIOPSY Right 08/26/2019   benign   BREAST LUMPECTOMY Right    BREAST LUMPECTOMY WITH RADIOACTIVE SEED AND SENTINEL LYMPH NODE BIOPSY Right 07/20/2017   Procedure: RIGHT BREAST LUMPECTOMY WITH RADIOACTIVE SEED AND SENTINEL LYMPH NODE BIOPSY;  Surgeon: Ethyl Lenis, MD;  Location: [facility] OR;  Service: General;  Laterality: Right;   CARDIAC CATHETERIZATION  04/03/2015   Procedure: Left Heart Cath and Cors/Grafts Angiography;  Surgeon: Peter M Swaziland, MD;  Location: [facility];  Service: Cardiovascular;;   CORONARY ARTERY BYPASS GRAFT  1/12   history of ankle fractures requiring surgery     LUMBAR LAMINECTOMY/DECOMPRESSION MICRODISCECTOMY Left 04/15/2015   Procedure: LUMBAR LAMINECTOMY/DECOMPRESSION MICRODISCECTOMY 1 LEVEL;  Surgeon: Reyes Budge, MD;  Location: [facility];  Service:  Neurosurgery;  Laterality: Left;  Left L23 microdiskectomy   LUMBAR LAMINECTOMY/DECOMPRESSION MICRODISCECTOMY N/A 05/27/2020   Procedure:  L2-3, L3-4  decompression;  Surgeon: Barbarann Oneil BROCKS, MD;  Location: [facility] OR;  Service: Orthopedics;  Laterality: N/A;   MV repair and resection of mass  1/12   RESECTION DISTAL CLAVICAL Left 05/16/2019   Procedure: RESECTION DISTAL CLAVICAL;  Surgeon: Vernetta Lonni GRADE, MD;  Location: [facility];  Service: Orthopedics;  Laterality: Left;   SHOULDER ARTHROSCOPY WITH ROTATOR CUFF REPAIR AND SUBACROMIAL DECOMPRESSION Left 05/16/2019   Procedure: SHOULDER ARTHROSCOPY WITH ROTATOR CUFF REPAIR AND SUBACROMIAL DECOMPRESSION;  Surgeon: Vernetta Lonni GRADE, MD;  Location: [facility];  Service: Orthopedics;  Laterality: Left;   TONSILLECTOMY  1971   TOTAL ABDOMINAL HYSTERECTOMY  1978   TRIGGER FINGER RELEASE Right 12/02/2021   Procedure: RIGHT RING FINGER A-1 PULLEY RELEASE;  Surgeon: Vernetta Lonni GRADE, MD;  Location: [facility];  Service: Orthopedics;  Laterality: Right;   TRIGGER FINGER RELEASE Left 05/11/2023   Procedure: LEFT INDEX FINGER RELEASE TRIGGER FINGER;  Surgeon: Vernetta Lonni GRADE, MD;  Location:  SURGERY CENTER;  Service: Orthopedics;  Laterality: Left;   VESICOVAGINAL FISTULA CLOSURE W/ TAH     Social History   Occupational History    Employer: UNEMPLOYED  Tobacco Use   Smoking status: Former    Current packs/day: 0.00    Types: Cigarettes    Quit date: 10/14/2010    Years since quitting: 13.3    Passive exposure: Never   Smokeless tobacco: Never  Vaping Use   Vaping status: Never Used  Substance and Sexual Activity   Alcohol use: Yes    Comment: social   Drug use: Not Currently    Types: Marijuana   Sexual activity: Not Currently

## 2024-02-14 ENCOUNTER — Encounter: Payer: Self-pay | Admitting: Sports Medicine

## 2024-02-15 ENCOUNTER — Other Ambulatory Visit: Payer: Self-pay

## 2024-02-16 ENCOUNTER — Ambulatory Visit
Admission: RE | Admit: 2024-02-16 | Discharge: 2024-02-16 | Disposition: A | Discharge status: Home / Self Care | Source: Ambulatory Visit | Attending: Sports Medicine | Admitting: Sports Medicine

## 2024-02-16 DIAGNOSIS — G8929 Other chronic pain: Secondary | ICD-10-CM

## 2024-02-16 DIAGNOSIS — M23251 Derangement of posterior horn of lateral meniscus due to old tear or injury, right knee: Secondary | ICD-10-CM | POA: Diagnosis not present

## 2024-02-16 DIAGNOSIS — M25461 Effusion, right knee: Secondary | ICD-10-CM

## 2024-02-16 DIAGNOSIS — M23300 Other meniscus derangements, unspecified lateral meniscus, right knee: Secondary | ICD-10-CM

## 2024-02-16 DIAGNOSIS — M1711 Unilateral primary osteoarthritis, right knee: Secondary | ICD-10-CM | POA: Diagnosis not present

## 2024-02-19 ENCOUNTER — Other Ambulatory Visit: Payer: Self-pay

## 2024-02-19 ENCOUNTER — Telehealth: Payer: Self-pay

## 2024-02-19 NOTE — Telephone Encounter (Signed)
 Patient called wanting someone to call her and go over the MRI results with her. Stated she read the results but didn't understand due to the medical terminology.  Please call and advise at [phone]

## 2024-02-21 ENCOUNTER — Telehealth: Payer: Self-pay | Admitting: Sports Medicine

## 2024-02-21 NOTE — Telephone Encounter (Signed)
 Patient called. She would like something stronger for pain. Says the tramadol  is not working

## 2024-02-22 ENCOUNTER — Other Ambulatory Visit: Payer: Self-pay | Admitting: Sports Medicine

## 2024-02-22 MED ORDER — HYDROCODONE-ACETAMINOPHEN 5-325 MG PO TABS: 1.0000 | ORAL_TABLET | Freq: Four times a day (QID) | ORAL | 0 refills | Status: DC | PRN

## 2024-02-23 NOTE — Telephone Encounter (Signed)
 Called patient to tell her that prescription was sent to [facility] in [facility]

## 2024-02-28 ENCOUNTER — Other Ambulatory Visit: Payer: Self-pay

## 2024-02-29 ENCOUNTER — Ambulatory Visit: Admitting: Sports Medicine

## 2024-02-29 ENCOUNTER — Encounter: Payer: Self-pay | Admitting: Sports Medicine

## 2024-02-29 DIAGNOSIS — M23300 Other meniscus derangements, unspecified lateral meniscus, right knee: Secondary | ICD-10-CM

## 2024-02-29 DIAGNOSIS — M1711 Unilateral primary osteoarthritis, right knee: Secondary | ICD-10-CM | POA: Diagnosis not present

## 2024-02-29 DIAGNOSIS — M23203 Derangement of unspecified medial meniscus due to old tear or injury, right knee: Secondary | ICD-10-CM | POA: Diagnosis not present

## 2024-02-29 DIAGNOSIS — M25461 Effusion, right knee: Secondary | ICD-10-CM

## 2024-02-29 DIAGNOSIS — G8929 Other chronic pain: Secondary | ICD-10-CM | POA: Diagnosis not present

## 2024-02-29 DIAGNOSIS — M25561 Pain in right knee: Secondary | ICD-10-CM | POA: Diagnosis not present

## 2024-02-29 MED ORDER — HYDROCODONE-ACETAMINOPHEN 5-325 MG PO TABS: 1.0000 | ORAL_TABLET | Freq: Four times a day (QID) | ORAL | 0 refills | Status: DC | PRN

## 2024-02-29 NOTE — Progress Notes (Signed)
 [name] - 73 y.o. female MRN [id number]  Date of birth: 1951-01-01  Office Visit Note: Visit Date: 02/29/2024 PCP: Vicci Barnie NOVAK, MD Referred by: Vicci Barnie NOVAK, MD  Subjective: Chief Complaint  Patient presents with   Right Knee - Follow-up   HPI: [name] is a pleasant 73 y.o. female who presents today for follow-up of acute on chronic right knee pain, also here for MRI review.  [name] is still having rather bothersome pain as well as swelling within the right knee joint.  She does have advanced arthritic change but over 1 month ago had a twisting injury with reactive effusion.  She did only receive temporary relief from the aspiration and corticosteroid injection.  We did obtain an MRI, see findings below.  She is interested in nonsurgical treatments, would like to avoid surgery if at all possible.  She is finding relief with Norco taking it as needed for breakthrough pain, but still having significant discomfort and difficulty with physical activity/walking within the right knee.   Meds: Tylenol , Gabapentin , Norco   She did call her son, [name]), on the phone today during the visit - I did answer his questions and discuss plan with him per permission from patient.  Pertinent ROS were reviewed with the patient and found to be negative unless otherwise specified above in HPI.   Assessment & Plan: Visit Diagnoses:  1. Unilateral primary osteoarthritis, right knee   2. Chronic pain of right knee   3. Degenerative tear of lateral meniscus of right knee   4. Degenerative tear of medial meniscus of right knee   5. Effusion, right knee    Plan: Impression is acute on chronic right knee pain which is multifactorial in nature.  She does have near complete collapse of the lateral joint line with advanced arthritic change.  MRI does confirm bony edema in the lateral femoral condyle, suspicious for subchondral insufficiency fx. she did have an acute to  subacute twisting injury about 6 weeks ago with MRI confirmed medial and lateral degenerative, complex meniscal tears.  We discussed all treatment options for her.  Given her degree of cartilage loss, I do not think that knee arthroscopy with meniscal repair/meniscectomy would provide her long-lasting relief.  If anything, this would accelerate her eventual need for total knee arthroplasty.  Given the edema within the condyle, we discussed relative offloading, safe exercises and considering viscosupplementation to help cushion and lubricate the knee joint.  She is interested in viscosupplementation, we will send off for authorization for the right knee.  She also had a family member who had Ortho biologic therapy such as viscosupplementation and PRP, we did have a discussion regarding PRP, but given the out-of-pocket cost, she would like to start with viscosupplementation.  Given her valgus instability and collapse of the lateral > medial joint line, I do think she would benefit from a customized lateral unloader knee brace.  Given body habitus, I do think this would need to be a custom knee brace, I did send a message to our [employer] rep, Aleck who will reach out to her about measuring and getting her set up for this.  Likely consider formal PT status post viscosupplementation going forward.  Evaluation from one of my knee surgeons could be considered, but she would like to hold on this for now.  This patient is diagnosed with osteoarthritis of the right knee.    Radiographs show evidence of joint space narrowing, osteophytes, subchondral sclerosis and/or  subchondral cysts.  This patient has knee pain which interferes with functional and activities of daily living.    This patient has experienced inadequate response, adverse effects and/or intolerance with conservative treatments such as acetaminophen , NSAIDS, topical creams, physical therapy or regular exercise, knee bracing and/or weight loss.   This  patient has experienced inadequate response or has a contraindication to intra articular steroid injections for at least 3 months.   This patient is not scheduled to have a total knee replacement within 6 months of starting treatment with viscosupplementation.  Follow-up: Return for will call once visco injection improved (will plan for US -guided inj, poss asp).   Meds & Orders:  Meds ordered this encounter  Medications   HYDROcodone -acetaminophen  (NORCO/VICODIN) 5-325 MG tablet    Sig: Take 1 tablet by mouth every 6 (six) hours as needed for moderate pain (pain score 4-6).    Dispense:  25 tablet    Refill:  0    Orders Placed This Encounter  Procedures   Ambulatory request for injection medication     Procedures: No procedures performed      Clinical History: No specialty comments available.  She reports that she quit smoking about 13 years ago. Her smoking use included cigarettes. She has never been exposed to tobacco smoke. She has never used smokeless tobacco.  Recent Labs    03/14/23 1424 06/27/23 1423 01/02/24 1426  HGBA1C 7.3* 8.5* 7.5*  *We did review A1c's today  Objective:    Physical Exam  Gen: Well-appearing, in no acute distress; non-toxic CV: Well-perfused. Warm.  Resp: Breathing unlabored on room air; no wheezing. Psych: Fluid speech in conversation; appropriate affect; normal thought process  Ortho Exam - Right knee: There is a mild to moderate effusion present with warmth but no redness or cellulitis.  There is tenderness more so over the lateral joint line but some tenderness on the medial side.  The knee does fall into a slightly valgus fashion with a degree of pseudo instability with valgus stress testing.  There is an abnormal quad to calf ratio with larger circumference of the distal quad and adipose tissue.  There is pain with hyperflexion with mild limitation in endrange flexion as well as pain with McMurray's testing lateral >  medial.  Imaging:  MR Knee Right w/o contrast MR KNEE WITHOUT IV CONTRAST RIGHT  COMPARISON: None.  CLINICAL HISTORY: Chronic knee pain.  PULSE SEQUENCES: Ax PD FS, Sag T2 ACL, Sag PD FS, Cor PD FS & COR T1  FINDINGS: Bones: There is moderate tricompartmental osteoarthrosis with moderate chondromalacia most notably the lateral compartment. There is mild subchondral reactive edema in the lateral femoral condyle. There is a moderate size reactive joint effusion and leaking Baker's cyst. Denser mechanism is intact.  Ligaments: There is thickening and increased signal in the ACL likely reflective of mucoid degeneration. The PCL is somewhat irregular centrally suggesting a partial tear. There are intact fibers suspected. Correlation for PCL stability. There is chronic thickening of the femoral origin of the MCL consistent with a chronic sprain. There are intact fibers. The fibular collateral ligament is intact.  Menisci: There is a radial tear at the root of the medial meniscus with a diminutive body and posterior horn. No displaced meniscal flap is identified. There is likely degenerative tear of the body of the lateral meniscus. No displaced meniscal tear is present.  IMPRESSION: Moderate tricompartmental osteoarthrosis with significant chondromalacia and mild subchondral reactive edema in the lateral femoral condyle. There is a  small reactive joint effusion and leaking Baker's cyst.  Thickening and increased signal in the ACL likely reflective of mucoid degeneration. There is irregularity in a possible partial tear of the PCL. Correlation for PCL stability.  Complex degenerative tears of the body of the lateral meniscus and medial meniscus. There is a diminutive posterior horn of the medial meniscus. No displaced meniscal flap is appreciated.  Electronically signed by: Norleen Satchel MD 02/17/2024 11:28 AM EDT RP Workstation: [id number]   Past  Medical/Family/Surgical/Social History: Medications & Allergies reviewed per EMR, new medications updated. Patient Active Problem List   Diagnosis Date Noted   Pneumonia 06/26/2023   Colonic mass 06/08/2023   Chronic right shoulder pain 03/28/2023   Neck pain 03/28/2023   Primary osteoarthritis of right shoulder 08/10/2022   CAP (community acquired pneumonia) 06/22/2022   Stage 3b chronic kidney disease (CKD) (HCC) 06/22/2022   Trigger finger, left index finger 12/02/2021   DM type 2 with diabetic peripheral neuropathy (HCC) 07/26/2021   Bilateral low back pain with bilateral sciatica 07/26/2021   Gait abnormality 07/26/2021   Controlled substance agreement signed 12/31/2020   Shoulder stiffness, left 07/07/2020   Status post lumbar spine surgery for decompression of spinal cord 06/09/2020   Lumbar stenosis 05/27/2020   Spinal stenosis of lumbar region 05/21/2019   Complete tear of left rotator cuff 05/16/2019   Uncontrolled type 2 diabetes mellitus with hyperglycemia (HCC) 12/19/2018   Microalbuminuria 04/08/2018   Genetic testing 09/25/2017   Family history of breast cancer    Family history of prostate cancer    Malignant neoplasm of upper-outer quadrant of right breast in female, estrogen receptor positive (HCC) 06/26/2017   Lumbar herniated disc 04/15/2015   Abnormal nuclear stress test 04/03/2015   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   CAD (coronary artery disease), native coronary artery 03/30/2015   COPD (chronic obstructive pulmonary disease) (HCC) 12/24/2013   S/P mitral valve repair 11/16/2010   Pure hypercholesterolemia 06/29/2010   Anemia of renal disease 09/29/2006   Essential hypertension 09/29/2006   Diabetes mellitus without complication (HCC) 09/29/2006   Past Medical History:  Diagnosis Date   Abscess in epidural space of L2-L5 lumbar spine 03/2006   Anemia, iron  deficiency    Arthritis    Breast cancer (HCC) 06/12/2017   right breast   CAD  (coronary artery disease), native coronary artery 03/30/2015   Cataract    Colon polyp    a. Multiple colonic polyps status post colonoscopy in October 2007, consistent with tubular adenoma, tubulovillous adenoma with no high-grade dysplasia or malignancy identified.    Constipation    Coronary artery disease    a. s/p CABG 06/2010: S-D2; S-PDA (at time of MV surgery).   Depression    Diabetes mellitus    type II   Family history of breast cancer    Family history of prostate cancer    Genetic testing 09/25/2017   Multi-Cancer panel (83 genes) @ [facility] - No pathogenic mutations detected   GERD (gastroesophageal reflux disease)    Hx of transient ischemic attack (TIA)    a. See stroke section.   Hyperlipidemia    Hypertension    Hypoxia    a. Has history of acute hypoxic respiratory failure in the setting of bronchitis/PNA or prior admissions.   MRSA infection    a. History of recurrent skin infection and soft tissue abscesses, with MRSA positive in the past.   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 mitral valve - s/p  resection and MV repair 06/2010 Dr. Dusty.   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 Distal R MCA.   Stroke [facility])    a. 11/2009: mitral mass diagnosed at this time, also has distal R MCA stenosis, tx with coumadin . b. Readmitted 05/2010 with TIA symptoms - had not been taking Coumadin . s/p MV surgery 06/2010. Coumadin  stopped 2013 after review of chart by Dr. Pietro since mass was removed (stroke felt possibly related to this).    Family History  Problem Relation Age of Onset   Colon cancer Mother        dx 72s; deceased 44   Irritable bowel syndrome Mother    Prostate cancer Father        deceased 31s   Cirrhosis Sister        died of GI bleed associated with cirrhosis of the liver   Stroke Brother    Prostate cancer Brother 46        deceased 87   Diabetes Brother        x3   Kidney disease Brother    Cancer Brother 50       unk. type   Breast cancer Cousin        several maternal 1st and 2nd cousins with breast cancer   Rectal cancer Neg Hx    Stomach cancer Neg Hx    Esophageal cancer Neg Hx    Past Surgical History:  Procedure Laterality Date   BREAST BIOPSY Right 08/26/2019   benign   BREAST LUMPECTOMY Right    BREAST LUMPECTOMY WITH RADIOACTIVE SEED AND SENTINEL LYMPH NODE BIOPSY Right 07/20/2017   Procedure: RIGHT BREAST LUMPECTOMY WITH RADIOACTIVE SEED AND SENTINEL LYMPH NODE BIOPSY;  Surgeon: Ethyl Lenis, MD;  [facility];  Service: General;  Laterality: Right;   CARDIAC CATHETERIZATION  04/03/2015   Procedure: Left Heart Cath and Cors/Grafts Angiography;  Surgeon: Peter M Swaziland, MD;  Location: [facility];  Service: Cardiovascular;;   CORONARY ARTERY BYPASS GRAFT  1/12   history of ankle fractures requiring surgery     LUMBAR LAMINECTOMY/DECOMPRESSION MICRODISCECTOMY Left 04/15/2015   Procedure: LUMBAR LAMINECTOMY/DECOMPRESSION MICRODISCECTOMY 1 LEVEL;  Surgeon: Reyes Budge, MD;  Location: [facility];  Service: Neurosurgery;  Laterality: Left;  Left L23 microdiskectomy   LUMBAR LAMINECTOMY/DECOMPRESSION MICRODISCECTOMY N/A 05/27/2020   Procedure: L2-3, L3-4  decompression;  Surgeon: Barbarann Oneil BROCKS, MD;  Location: [facility];  Service: Orthopedics;  Laterality: N/A;   MV repair and resection of mass  1/12   RESECTION DISTAL CLAVICAL Left 05/16/2019   Procedure: RESECTION DISTAL CLAVICAL;  Surgeon: Vernetta Lonni GRADE, MD;  Location: [facility];  Service: Orthopedics;  Laterality: Left;   SHOULDER ARTHROSCOPY WITH ROTATOR CUFF REPAIR AND SUBACROMIAL DECOMPRESSION Left 05/16/2019   Procedure: SHOULDER ARTHROSCOPY WITH ROTATOR CUFF REPAIR AND SUBACROMIAL DECOMPRESSION;  Surgeon: Vernetta Lonni GRADE, MD;  Location: [facility];  Service: Orthopedics;  Laterality:  Left;   TONSILLECTOMY  1971   TOTAL ABDOMINAL HYSTERECTOMY  1978   TRIGGER FINGER RELEASE Right 12/02/2021   Procedure: RIGHT RING FINGER A-1 PULLEY RELEASE;  Surgeon: Vernetta Lonni GRADE, MD;  Location: [facility];  Service: Orthopedics;  Laterality: Right;   TRIGGER FINGER RELEASE Left 05/11/2023   Procedure: LEFT INDEX FINGER RELEASE TRIGGER FINGER;  Surgeon: Vernetta Lonni  Y, MD;  Location: [facility];  Service: Orthopedics;  Laterality: Left;   VESICOVAGINAL FISTULA CLOSURE W/ TAH     Social History   Occupational History    Employer: UNEMPLOYED  Tobacco Use   Smoking status: Former    Current packs/day: 0.00    Types: Cigarettes    Quit date: 10/14/2010    Years since quitting: 13.3    Passive exposure: Never   Smokeless tobacco: Never  Vaping Use   Vaping status: Never Used  Substance and Sexual Activity   Alcohol use: Yes    Comment: social   Drug use: Not Currently    Types: Marijuana   Sexual activity: Not Currently   I spent 42 minutes in the care of the patient today including face-to-face time, preparation to see the patient, as well as time spent with independent review of knee MRI, review of MRI and discussion on treatment options with the patient, time spent on the phone with patient's son, Stacy, describing MRI findings, diagnoses and treatments (both conservative and possible surgical treatment options), discussion on short-term pain medication, coordination with [employer] rep for custom knee brace for the above diagnoses.   Lonell Sprang, DO Primary Care Sports Medicine Physician  [facility] Orthoped[facility]  This note was dictated using Dragon naturally speaking software and may contain errors in syntax, spelling, or content which have not been identified prior to signing this note.

## 2024-02-29 NOTE — Progress Notes (Signed)
 Patient is here to review MRI of her right knee. She says that she has had more trouble with the knee, and points over the lateral side when describing her pain.

## 2024-03-04 ENCOUNTER — Other Ambulatory Visit: Payer: Self-pay | Admitting: Internal Medicine

## 2024-03-04 NOTE — Telephone Encounter (Unsigned)
 Copied from CRM [id number]. Topic: Clinical - Medication Refill >> Mar 04, 2024  2:59 PM Amy B wrote: Medication: Ozempic   Has the patient contacted their pharmacy? Yes (Agent: If no, request that the patient contact the pharmacy for the refill. If patient does not wish to contact the pharmacy document the reason why and proceed with request.) (Agent: If yes, when and what did the pharmacy advise?)  This is the patient's preferred pharmacy:   [facility] [address] [place] [address] Phone: [phone] Fax: [phone]  Is this the correct pharmacy for this prescription? Yes If no, delete pharmacy and type the correct one.   Has the prescription been filled recently? No  Is the patient out of the medication? Yes  Has the patient been seen for an appointment in the last year OR does the patient have an upcoming appointment? Yes  Can we respond through MyChart? Yes  Agent: Please be advised that Rx refills may take up to 3 business days. We ask that you follow-up with your pharmacy.

## 2024-03-05 ENCOUNTER — Telehealth: Payer: Self-pay | Admitting: Sports Medicine

## 2024-03-05 NOTE — Telephone Encounter (Signed)
 Patient called and said she was returning a call from you. CB#[phone]

## 2024-03-05 NOTE — Telephone Encounter (Signed)
 Yes I did. Will call

## 2024-03-06 ENCOUNTER — Other Ambulatory Visit: Payer: Self-pay

## 2024-03-12 ENCOUNTER — Other Ambulatory Visit: Payer: Self-pay | Admitting: Internal Medicine

## 2024-03-12 DIAGNOSIS — F32A Depression, unspecified: Secondary | ICD-10-CM

## 2024-03-15 ENCOUNTER — Telehealth: Payer: Self-pay | Admitting: Internal Medicine

## 2024-03-15 NOTE — Telephone Encounter (Signed)
Confirmed appt for 9/22

## 2024-03-18 ENCOUNTER — Other Ambulatory Visit: Payer: Self-pay

## 2024-03-18 ENCOUNTER — Telehealth: Payer: Self-pay | Admitting: Orthopedic Surgery

## 2024-03-18 ENCOUNTER — Encounter: Payer: Self-pay | Admitting: Pharmacist

## 2024-03-18 ENCOUNTER — Ambulatory Visit: Attending: Internal Medicine | Admitting: Pharmacist

## 2024-03-18 DIAGNOSIS — E1165 Type 2 diabetes mellitus with hyperglycemia: Secondary | ICD-10-CM

## 2024-03-18 DIAGNOSIS — I10 Essential (primary) hypertension: Secondary | ICD-10-CM

## 2024-03-18 DIAGNOSIS — Z7985 Long-term (current) use of injectable non-insulin antidiabetic drugs: Secondary | ICD-10-CM

## 2024-03-18 DIAGNOSIS — Z7984 Long term (current) use of oral hypoglycemic drugs: Secondary | ICD-10-CM

## 2024-03-18 MED ORDER — BLOOD PRESSURE CUFF MISC: 0 refills | Status: DC

## 2024-03-18 MED ORDER — ACCU-CHEK GUIDE TEST VI STRP: ORAL_STRIP | 6 refills | Status: AC

## 2024-03-18 MED ORDER — ACCU-CHEK GUIDE W/DEVICE KIT: PACK | 0 refills | Status: DC

## 2024-03-18 MED ORDER — ACCU-CHEK SOFTCLIX LANCETS MISC: 6 refills | Status: AC

## 2024-03-18 MED ORDER — ALCOHOL PREP 70 % PADS: MEDICATED_PAD | 6 refills | Status: AC

## 2024-03-18 MED ORDER — PEN NEEDLES 31G X 8 MM MISC: 1 refills | Status: AC

## 2024-03-18 NOTE — Progress Notes (Signed)
 Pharmacy TNM Diabetes Measure Review  I connected with  [name] on 03/18/24 by a video enabled telemedicine application and verified that I am speaking with the correct person using two identifiers.   I discussed the limitations of evaluation and management by telemedicine. The patient expressed understanding and agreed to proceed.  Location of Patient: home   Location of myself: clinic office   Persons participating in the call: the patient and myself  S:  Patient was identified in a report as being at risk for failing the True [place] Metric of A1c control (<8%) . Last A1c was 7.5% in July. Last PCP visit 11/24/23 - has an appt upcoming with Dr. Vicci on 04/02/2024.  73 y.o. female who presents for diabetes evaluation, education, and management. Patient is in good spirits and presents without any assistance. Last pharmacy visit was on 02/05/2024. We increased Ozempic  to 2 mg weekly and stopped basal insulin .  PMH is significant for HTN, T2DM, hyperlipidemia, depression, chronic back pain, OA (managed by Dr. Burnetta), RT breast CA (intraductal CA, ER/PR+, lumpectomy and XRT - completed 08/2017), CKD stage 3 (most recent eGFR in our system was 39, last [facility] visit was 01/17/24 w/ Dr. Macel - sees him 03/20/24), CVA 2011 (residual weakness on LT side), CAD s/p CABG and MVR/resection of fibroelastoma, IDA.   Patient reports Diabetes is longstanding. She is doing better since adding Ozempic . Pharmacy is assisting with MAP for this. She denies any NV, abdominal pain, or changes in vision since increasing to the 2 mg weekly dose. She is adherent to Ozempic  and Farxiga  as prescribed.  Family/Social History:  -Fhx: colon cancer, IBS, prostate cancer, cirrhosis, stroke, prostate cancer, diabetes, kidney disease, cancer -Smoking: none  Current diabetes medications include: Farxiga  5 mg daily, Ozempic  2 mg weekly Patient reports adherence to taking all medications as  prescribed.  Insurance coverage: [employer]  Patient denies hypoglycemic events. Reported blood sugars: 89 - 121 mg/dL.   Patient denies polyuria. Patient reports neuropathy (nerve pain). Patient denies visual changes. Patient reports self foot exams.   Patient reported dietary habits: Eats 1-2 meals/day -Will often eat a smaller breakfast/snack in the morning.  -Eats dinner -Notes a decreased appetite  O:   Lab Results  Component Value Date   HGBA1C 7.5 (A) 01/02/2024   There were no vitals filed for this visit.  Lipid Panel     Component Value Date/Time   CHOL 175 09/28/2023 1009   TRIG 72 09/28/2023 1009   HDL 100 09/28/2023 1009   CHOLHDL 1.8 09/28/2023 1009   CHOLHDL 2.8 04/28/2016 1028   VLDL 19 04/28/2016 1028   LDLCALC 62 09/28/2023 1009   LDLDIRECT 101.7 07/18/2011 0901    Clinical Atherosclerotic Cardiovascular Disease (ASCVD): Yes  The ASCVD Risk score (Arnett DK, et al., 2019) failed to calculate for the following reasons:   Risk score cannot be calculated because patient has a medical history suggesting prior/existing ASCVD   Patient is participating in a Managed Medicaid Plan: No   A/P: Diabetes longstanding currently close to goal based on A1c of 7.5% last month. Commended her for this! Reported home sugars are at goal. Commended her for this! Patient is not symptomatic at this time but is able to verbalize appropriate hypoglycemia management plan. Medication adherence appears to be optimal. -Continue Ozempic  to 2 mg weekly. -Continue Farxiga  5 mg once daily.  -Patient educated on purpose, proper use, and potential adverse effects of Ozempic .  -Extensively discussed pathophysiology of diabetes, recommended  lifestyle interventions, dietary effects on blood sugar control.  -Counseled on s/sx of and management of hypoglycemia.  -Next A1c anticipated 03/2024.   Follow-up:  Pharmacist: prn PCP: 04/02/2024  Herlene Fleeta Morris, PharmD, BCACP, CPP Clinical  Pharmacist [facility] [phone]

## 2024-03-18 NOTE — Telephone Encounter (Signed)
 Ms. Benninger called in to request a refill of hydrocodone .  She uses [facility], [facility] in [place].  Call back # is [phone].

## 2024-03-19 ENCOUNTER — Other Ambulatory Visit: Payer: Self-pay | Admitting: Sports Medicine

## 2024-03-19 ENCOUNTER — Telehealth: Payer: Self-pay | Admitting: Sports Medicine

## 2024-03-19 MED ORDER — HYDROCODONE-ACETAMINOPHEN 5-325 MG PO TABS: 1.0000 | ORAL_TABLET | Freq: Four times a day (QID) | ORAL | 0 refills | Status: DC | PRN

## 2024-03-19 NOTE — Telephone Encounter (Signed)
 Patient called again and asked if you were going to be able to fill her pain medication for her.  If you all could let her know, thanks.

## 2024-03-22 ENCOUNTER — Other Ambulatory Visit: Payer: Self-pay

## 2024-03-26 ENCOUNTER — Encounter: Payer: Self-pay | Admitting: Sports Medicine

## 2024-03-26 ENCOUNTER — Other Ambulatory Visit: Payer: Self-pay

## 2024-03-26 ENCOUNTER — Ambulatory Visit: Admitting: Sports Medicine

## 2024-03-26 DIAGNOSIS — G8929 Other chronic pain: Secondary | ICD-10-CM

## 2024-03-26 DIAGNOSIS — M25561 Pain in right knee: Secondary | ICD-10-CM

## 2024-03-26 DIAGNOSIS — M25461 Effusion, right knee: Secondary | ICD-10-CM | POA: Diagnosis not present

## 2024-03-26 DIAGNOSIS — M1711 Unilateral primary osteoarthritis, right knee: Secondary | ICD-10-CM

## 2024-03-26 MED ORDER — HYALURONAN 88 MG/4ML IX SOSY
88.0000 mg | PREFILLED_SYRINGE | INTRA_ARTICULAR | Status: AC | PRN
  Administered 2024-03-26: 88 mg via INTRA_ARTICULAR

## 2024-03-26 MED ORDER — LIDOCAINE HCL 1 % IJ SOLN
6.0000 mL | INTRAMUSCULAR | Status: AC | PRN
  Administered 2024-03-26: 6 mL

## 2024-03-26 NOTE — Progress Notes (Signed)
 Office & Procedure Note  Patient: [name]             Date of Birth: 1950/11/02           MRN: [id number]             Visit Date: 03/26/2024  HPI: [name] is a pleasant 73 year old female who presents with acute on chronic right knee pain.  She did receive very temporary relief from previous corticosteroid injection.  She presents today for viscosupplementation.  She also has questions regarding her customized brace and other treatment options.  Since her last visit, I did write her a prescription for Norco 5-325 mg to take for breakthrough pain only, she is using this at least once a day, sometimes twice depending on her pain.  PE: - Right knee: Patient has antalgic gait favoring the right knee, they present into the office in a wheelchair.  She has at least a moderate effusion recurrent on the right knee.  There is both lateral and medial joint line tenderness to palpation.   Imaging: MR Knee Right w/o contrast MR KNEE WITHOUT IV CONTRAST RIGHT  COMPARISON: None.  CLINICAL HISTORY: Chronic knee pain.  PULSE SEQUENCES: Ax PD FS, Sag T2 ACL, Sag PD FS, Cor PD FS & COR T1  FINDINGS: Bones: There is moderate tricompartmental osteoarthrosis with moderate chondromalacia most notably the lateral compartment. There is mild subchondral reactive edema in the lateral femoral condyle. There is a moderate size reactive joint effusion and leaking Baker's cyst. Denser mechanism is intact.  Ligaments: There is thickening and increased signal in the ACL likely reflective of mucoid degeneration. The PCL is somewhat irregular centrally suggesting a partial tear. There are intact fibers suspected. Correlation for PCL stability. There is chronic thickening of the femoral origin of the MCL consistent with a chronic sprain. There are intact fibers. The fibular collateral ligament is intact.  Menisci: There is a radial tear at the root of the medial meniscus with a diminutive body and  posterior horn. No displaced meniscal flap is identified. There is likely degenerative tear of the body of the lateral meniscus. No displaced meniscal tear is present.  IMPRESSION: Moderate tricompartmental osteoarthrosis with significant chondromalacia and mild subchondral reactive edema in the lateral femoral condyle. There is a small reactive joint effusion and leaking Baker's cyst.  Thickening and increased signal in the ACL likely reflective of mucoid degeneration. There is irregularity in a possible partial tear of the PCL. Correlation for PCL stability.  Complex degenerative tears of the body of the lateral meniscus and medial meniscus. There is a diminutive posterior horn of the medial meniscus. No displaced meniscal flap is appreciated.  Electronically signed by: Norleen Satchel MD 02/17/2024 11:28 AM EDT RP Workstation: [id number]  Visit Diagnoses:  1. Unilateral primary osteoarthritis, right knee   2. Chronic pain of right knee   3. Effusion, right knee    Procedures:  Large Joint Inj: R knee on 03/26/2024 1:50 PM Indications: joint swelling and pain Details: 18 G 1.5 in needle, ultrasound-guided superolateral approach Medications: 6 mL lidocaine  1 %; 88 mg Hyaluronan 88 MG/4ML Aspirate: 30 mL clear, yellow and blood-tinged; sent for lab analysis Outcome: tolerated well, no immediate complications  *Procedurally successful ultrasound-guided knee aspiration and subsequent injection with Monovisc, Right knee Procedure, treatment alternatives, risks and benefits explained, specific risks discussed. Consent was given by the patient. Immediately prior to procedure a time out was called to verify the correct patient, procedure, equipment, support  staff and site/side marked as required. Patient was prepped and draped in the usual sterile fashion.      Plan: -Did discuss her x-ray and MRI findings with advanced knee arthritic change, effusion and degenerative meniscal  tearing. - Shared decision making, did proceed with ultrasound-guided knee aspiration, yielding 30 cc as well as injection with Monovisc (hyaluronic acid).  Post aspiration/injection protocol discussed - I did reach out to our [employer] rep, [name], who will contact the patient tomorrow regarding her custom lateral knee unloader brace - [name] will keep me updated over the next few weeks to 1 month regarding her improvement.  Did discuss if she is not receiving good relief from the above treatment, next step would be sending her to Dr. Vernetta for consideration of surgical options, likely total knee arthroplasty - She may continue her Percocet 5-325 mg as needed for breakthrough pain in the interim, did discuss I can fill this in the short-term but not long-term.  Long-term management would be through pain management. She is understanding.  Stacy Sprang, DO Primary Care Sports Medicine Physician  [facility] - Orthopedics  This note was dictated using Dragon naturally speaking software and may contain errors in syntax, spelling, or content which have not been identified prior to signing this note.

## 2024-04-01 DIAGNOSIS — E1122 Type 2 diabetes mellitus with diabetic chronic kidney disease: Secondary | ICD-10-CM | POA: Diagnosis not present

## 2024-04-01 DIAGNOSIS — I1A0 Resistant hypertension: Secondary | ICD-10-CM | POA: Diagnosis not present

## 2024-04-01 DIAGNOSIS — D509 Iron deficiency anemia, unspecified: Secondary | ICD-10-CM | POA: Diagnosis not present

## 2024-04-01 DIAGNOSIS — N390 Urinary tract infection, site not specified: Secondary | ICD-10-CM | POA: Diagnosis not present

## 2024-04-01 DIAGNOSIS — N1832 Chronic kidney disease, stage 3b: Secondary | ICD-10-CM | POA: Diagnosis not present

## 2024-04-02 ENCOUNTER — Encounter: Payer: Self-pay | Admitting: Internal Medicine

## 2024-04-02 ENCOUNTER — Other Ambulatory Visit: Payer: Self-pay | Admitting: Pharmacist

## 2024-04-02 ENCOUNTER — Telehealth: Payer: Self-pay | Admitting: Sports Medicine

## 2024-04-02 ENCOUNTER — Other Ambulatory Visit: Payer: Self-pay

## 2024-04-02 ENCOUNTER — Other Ambulatory Visit: Payer: Self-pay | Admitting: Sports Medicine

## 2024-04-02 ENCOUNTER — Ambulatory Visit: Attending: Internal Medicine | Admitting: Internal Medicine

## 2024-04-02 DIAGNOSIS — Z7985 Long-term (current) use of injectable non-insulin antidiabetic drugs: Secondary | ICD-10-CM

## 2024-04-02 DIAGNOSIS — I129 Hypertensive chronic kidney disease with stage 1 through stage 4 chronic kidney disease, or unspecified chronic kidney disease: Secondary | ICD-10-CM

## 2024-04-02 DIAGNOSIS — Z7984 Long term (current) use of oral hypoglycemic drugs: Secondary | ICD-10-CM | POA: Diagnosis not present

## 2024-04-02 DIAGNOSIS — Z1231 Encounter for screening mammogram for malignant neoplasm of breast: Secondary | ICD-10-CM

## 2024-04-02 DIAGNOSIS — E1169 Type 2 diabetes mellitus with other specified complication: Secondary | ICD-10-CM | POA: Diagnosis not present

## 2024-04-02 DIAGNOSIS — E119 Type 2 diabetes mellitus without complications: Secondary | ICD-10-CM

## 2024-04-02 DIAGNOSIS — Z23 Encounter for immunization: Secondary | ICD-10-CM

## 2024-04-02 DIAGNOSIS — E1159 Type 2 diabetes mellitus with other circulatory complications: Secondary | ICD-10-CM

## 2024-04-02 DIAGNOSIS — J40 Bronchitis, not specified as acute or chronic: Secondary | ICD-10-CM

## 2024-04-02 DIAGNOSIS — Z1211 Encounter for screening for malignant neoplasm of colon: Secondary | ICD-10-CM

## 2024-04-02 DIAGNOSIS — E1165 Type 2 diabetes mellitus with hyperglycemia: Secondary | ICD-10-CM

## 2024-04-02 DIAGNOSIS — N184 Chronic kidney disease, stage 4 (severe): Secondary | ICD-10-CM

## 2024-04-02 LAB — POCT GLYCOSYLATED HEMOGLOBIN (HGB A1C): HbA1c, POC (controlled diabetic range): 6.7 % (ref 0.0–7.0)

## 2024-04-02 LAB — GLUCOSE, POCT (MANUAL RESULT ENTRY): POC Glucose: 107 mg/dL — AB (ref 70–99)

## 2024-04-02 MED ORDER — ALBUTEROL SULFATE HFA 108 (90 BASE) MCG/ACT IN AERS
2.0000 | INHALATION_SPRAY | Freq: Four times a day (QID) | RESPIRATORY_TRACT | 0 refills | Status: AC | PRN
  Filled 2024-04-02: qty 6.7, 25d supply, fill #0
  Filled 2024-04-02: qty 6.7, 30d supply, fill #0

## 2024-04-02 MED ORDER — HYDROCODONE-ACETAMINOPHEN 5-325 MG PO TABS: 1.0000 | ORAL_TABLET | Freq: Four times a day (QID) | ORAL | 0 refills | Status: DC | PRN

## 2024-04-02 MED ORDER — TETANUS-DIPHTH-ACELL PERTUSSIS 5-2-15.5 LF-MCG/0.5 IM SUSP: 0.5000 mL | Freq: Once | INTRAMUSCULAR | 0 refills | Status: AC

## 2024-04-02 MED ORDER — AZITHROMYCIN 250 MG PO TABS
ORAL_TABLET | ORAL | 0 refills | Status: AC
  Filled 2024-04-02: qty 6, 5d supply, fill #0
  Filled 2024-04-02: qty 6, fill #0

## 2024-04-02 NOTE — Progress Notes (Signed)
 Patient ID: [name], female    DOB: [birth year]  MRN: [id number]  CC: Diabetes (DM f/u. Layvonne medication to be mailed Janiece to mammogram. Flu vax administered on 04/01/24 - C.A.)   Subjective: [name] is a 73 y.o. female who presents for chronic ds management. Her concerns today include:  Pt with hx of HTN, DM, HL, depression, chronic back pain, RT breast CA (intraductal CA, ER/PR+, lumpectomy and XRT - completed 08/2017), CKD stage 3, CVA 2011 (residual weakness on LT side), CAD s/p CABG and MVR/resection of fibroelastoma, IDA.   Discussed the use of AI scribe software for clinical note transcription with the patient, who gave verbal consent to proceed.  History of Present Illness ALTAGRACIA Greene is a 73 year old female with diabetes and osteoarthritis who presents for follow-up and management of her chronic conditions.  DM: Results for orders placed or performed in visit on 04/02/24  POCT glucose (manual entry)   Collection Time: 04/02/24  3:30 PM  Result Value Ref Range   POC Glucose 107 (A) 70 - 99 mg/dl  POCT glycosylated hemoglobin (Hb A1C)   Collection Time: 04/02/24  3:32 PM  Result Value Ref Range   Hemoglobin A1C     HbA1c POC (<> result, manual entry)     HbA1c, POC (prediabetic range)     HbA1c, POC (controlled diabetic range) 6.7 0.0 - 7.0 %   *Note: Due to a large number of results and/or encounters for the requested time period, some results have not been displayed. A complete set of results can be found in Results Review.  She has type 2 diabetes managed with Ozempic  2 mg once weekly and Farxiga  5 mg daily. Her blood sugar readings range from 97 to 121 mg/dL. She has lost 11 pounds since May, now weighing 251 pounds, due to decreased appetite.  Tolerating Ozempic . She avoids sugary drinks. She eats a lot of fruits including  watermelon, nectarines, plums, grapes, cantaloupe, pears, and apples.   She has osteoarthritis in her right knee and  recently received a gel injection and fluid aspiration by Dr. Burnetta. She has meniscus tears in both knees but is not pursuing surgery, considering weight loss before potential knee replacement surgery.   HTN: Her blood pressure today was 153/79 mmHg. She did not take her medications this morning due to a busy morning. At home, her blood pressure is typically around 130/72 mmHg. She is on hydralazine  100 mg three times daily, valsartan  40 mg daily, lidocaine  10 mg daily, and furosemide  30 mg daily (20 mg, one and a half tablets).  CKD3-4: recently saw a nephrologist yesterday who conducted lab work. She is awaiting results. On Farxiga  5 mg daily.  She has a persistent cough with thick green mucus for over a month, more pronounced in the mornings and throughout the day. No fever or shortness of breath. She has a history of COPD diagnosed when she was a smoker, which she quit in 2012. She uses over-the-counter medications for congestion.    Patient Active Problem List   Diagnosis Date Noted   Pneumonia 06/26/2023   Colonic mass 06/08/2023   Chronic right shoulder pain 03/28/2023   Neck pain 03/28/2023   Primary osteoarthritis of right shoulder 08/10/2022   CAP (community acquired pneumonia) 06/22/2022   Stage 3b chronic kidney disease (CKD) (HCC) 06/22/2022   Trigger finger, left index finger 12/02/2021   DM type 2 with diabetic peripheral neuropathy (HCC) 07/26/2021   Bilateral low  back pain with bilateral sciatica 07/26/2021   Gait abnormality 07/26/2021   Controlled substance agreement signed 12/31/2020   Shoulder stiffness, left 07/07/2020   Status post lumbar spine surgery for decompression of spinal cord 06/09/2020   Lumbar stenosis 05/27/2020   Spinal stenosis of lumbar region 05/21/2019   Complete tear of left rotator cuff 05/16/2019   Uncontrolled type 2 diabetes mellitus with hyperglycemia (HCC) 12/19/2018   Microalbuminuria 04/08/2018   Genetic testing 09/25/2017   Family  history of breast cancer    Family history of prostate cancer    Malignant neoplasm of upper-outer quadrant of right breast in female, estrogen receptor positive (HCC) 06/26/2017   Lumbar herniated disc 04/15/2015   Abnormal nuclear stress test 04/03/2015   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 100 each 6   Alcohol  Swabs (ALCOHOL  PREP) 70 % PADS Use to prep skin to check blood sugar three times daily. 100 each 6   amLODipine  (NORVASC ) 10 MG tablet TAKE 1 TABLET EVERY DAY 90 tablet 3   aspirin  EC 81 MG tablet Take 1 tablet (81 mg total) by mouth daily. 30 tablet 5   atorvastatin  (LIPITOR ) 80 MG tablet TAKE 1 TABLET EVERY DAY 90 tablet 1   Blood Glucose Monitoring Suppl (ACCU-CHEK GUIDE) w/Device KIT Use to check blood sugar 3 times daily. 1 kit 0   Blood Pressure Monitoring (BLOOD PRESSURE CUFF) MISC Use to check blood pressure daily. 1 each 0   buPROPion  (WELLBUTRIN  XL) 150 MG 24 hr tablet Take 1 tablet by mouth twice daily 180 tablet 2   dapagliflozin  propanediol (FARXIGA ) 5 MG TABS tablet Take 1 tablet (5 mg total) by mouth daily before breakfast.     diclofenac  Sodium (VOLTAREN ) 1 % GEL APPLY 2 GRAMS TOPICALLY 4 (FOUR) TIMES DAILY AS NEEDED. 100 g 0   DULoxetine  (CYMBALTA ) 30 MG capsule TAKE 1 CAPSULE EVERY DAY 90 capsule 3   FEROSUL 325 (65 Fe) MG tablet Take 325 mg by mouth daily with breakfast.     furosemide  (LASIX ) 20 MG tablet Take 1.5 tablets (30 mg total) by mouth in the  morning. 135 tablet 1   gabapentin  (NEURONTIN ) 300 MG capsule Take 1 capsule (300 mg total) by mouth 3 (three) times daily. 90 capsule 11   glucose blood (ACCU-CHEK GUIDE TEST) test strip Use to check blood sugar 3 times daily. 100 each 6   hydrALAZINE  (APRESOLINE ) 100 MG tablet Take 1 tablet (100 mg total) by mouth 3 (three) times daily. 270 tablet 3   Insulin  Pen Needle (PEN NEEDLES) 31G X 8 MM MISC Use to inject Ozempic . 100 each 1   letrozole  (FEMARA ) 2.5 MG tablet Take 1 tablet (2.5 mg total) by mouth daily with lunch. 90 tablet 3   methocarbamol  (ROBAXIN ) 500 MG tablet TAKE 1 TABLET EVERY 8 HOURS AS NEEDED FOR MUSCLE SPASMS. 270 tablet 1   Multiple Vitamin (MULTIVITAMIN WITH MINERALS) TABS tablet Take 1 tablet by mouth daily with breakfast.     pantoprazole  (PROTONIX ) 40 MG tablet Take 40  mg by mouth daily.     Semaglutide , 2 MG/DOSE, (OZEMPIC , 2 MG/DOSE,) 8 MG/3ML SOPN Inject 2 mg into the skin once a week. 9 mL 2   traZODone  (DESYREL ) 50 MG tablet TAKE 1 TABLET AT BEDTIME AS NEEDED FOR SLEEP 90 tablet 1   valsartan  (DIOVAN ) 40 MG tablet Take 1 tablet (40 mg total) by mouth daily. 90 tablet 1   docusate sodium  (COLACE) 100 MG capsule Take 100 mg by mouth daily as needed for mild constipation. (Patient not taking: Reported on 04/02/2024)     polyethylene glycol powder (GLYCOLAX /MIRALAX ) 17 GM/SCOOP powder Take 17 g by mouth daily as needed for mild constipation. (Patient not taking: Reported on 04/02/2024)     No current facility-administered medications on file prior to visit.    Allergies  Allergen Reactions   Coreg  [Carvedilol ]     Bradycardia    Social History   Socioeconomic History   Marital status: Married    Spouse name: Not on file   Number of children: 3   Years of education: some college   Highest education level: Not on file  Occupational History    Employer: UNEMPLOYED  Tobacco Use   Smoking status: Former    Current packs/day: 0.00    Types: Cigarettes    Quit  date: 10/14/2010    Years since quitting: 13.4    Passive exposure: Never   Smokeless tobacco: Never  Vaping Use   Vaping status: Never Used  Substance and Sexual Activity   Alcohol  use: Yes    Comment: social   Drug use: Not Currently    Types: Marijuana   Sexual activity: Not Currently  Other Topics Concern   Not on file  Social History Narrative   Lives with husband, stay at home, uses cane occasionally, still active/ambulatory.    Left-handed.   No daily caffeine use.   Social Drivers of Corporate investment banker Strain: Low Risk  (02/06/2024)   Overall Financial Resource Strain (CARDIA)    Difficulty of Paying Living Expenses: Not hard at all  Food Insecurity: No Food Insecurity (02/06/2024)   Hunger Vital Sign    Worried About Running Out of Food in the Last Year: Never true    Ran Out of Food in the Last Year: Never true  Transportation Needs: No Transportation Needs (02/06/2024)   PRAPARE - Administrator, Civil Service (Medical): No    Lack of Transportation (Non-Medical): No  Physical Activity: Inactive (02/06/2024)   Exercise Vital Sign    Days of Exercise per Week: 0 days    Minutes of Exercise per Session: 0 min  Stress: No Stress Concern Present (02/06/2024)   [employer] of Occupational Health - Occupational Stress Questionnaire    Feeling of Stress: Not at all  Social Connections: Socially Integrated (02/06/2024)   Social Connection and Isolation Panel    Frequency of Communication with Friends and Family: More than three times a week    Frequency of Social Gatherings with Friends and Family: Three times a week    Attends Religious Services: More than 4 times per year    Active Member of Clubs or Organizations: Yes    Attends Banker Meetings: More than 4 times per year    Marital Status: Married  Catering manager Violence: Not At Risk (02/06/2024)   Humiliation, Afraid, Rape, and Kick questionnaire    Fear of Current or  Ex-Partner: No    Emotionally Abused: No  Physically Abused: No    Sexually Abused: No    Family History  Problem Relation Age of Onset   Colon cancer Mother        dx 33s; deceased 79   Irritable bowel syndrome Mother    Prostate cancer Father        deceased 107s   Cirrhosis Sister        died of GI bleed associated with cirrhosis of the liver   Stroke Brother    Prostate cancer Brother 81       deceased 63   Diabetes Brother        x3   Kidney disease Brother    Cancer Brother 64       unk. type   Breast cancer Cousin        several maternal 1st and 2nd cousins with breast cancer   Rectal cancer Neg Hx    Stomach cancer Neg Hx    Esophageal cancer Neg Hx     Past Surgical History:  Procedure Laterality Date   BREAST BIOPSY Right 08/26/2019   benign   BREAST LUMPECTOMY Right    BREAST LUMPECTOMY WITH RADIOACTIVE SEED AND SENTINEL LYMPH NODE BIOPSY Right 07/20/2017   Procedure: RIGHT BREAST LUMPECTOMY WITH RADIOACTIVE SEED AND SENTINEL LYMPH NODE BIOPSY;  Surgeon: Ethyl Lenis, MD;  Location: [facility] OR;  Service: General;  Laterality: Right;   CARDIAC CATHETERIZATION  04/03/2015   Procedure: Left Heart Cath and Cors/Grafts Angiography;  Surgeon: Peter M Swaziland, MD;  Location: [facility] INVASIVE CV LAB;  Service: Cardiovascular;;   CORONARY ARTERY BYPASS GRAFT  1/12   history of ankle fractures requiring surgery     LUMBAR LAMINECTOMY/DECOMPRESSION MICRODISCECTOMY Left 04/15/2015   Procedure: LUMBAR LAMINECTOMY/DECOMPRESSION MICRODISCECTOMY 1 LEVEL;  Surgeon: Reyes Budge, MD;  Location: [facility] NEURO ORS;  Service: Neurosurgery;  Laterality: Left;  Left L23 microdiskectomy   LUMBAR LAMINECTOMY/DECOMPRESSION MICRODISCECTOMY N/A 05/27/2020   Procedure: L2-3, L3-4  decompression;  Surgeon: Barbarann Oneil BROCKS, MD;  Location: [facility] OR;  Service: Orthopedics;  Laterality: N/A;   MV repair and resection of mass  1/12   RESECTION DISTAL CLAVICAL Left 05/16/2019   Procedure: RESECTION DISTAL  CLAVICAL;  Surgeon: Vernetta Lonni GRADE, MD;  Location: [facility];  Service: Orthopedics;  Laterality: Left;   SHOULDER ARTHROSCOPY WITH ROTATOR CUFF REPAIR AND SUBACROMIAL DECOMPRESSION Left 05/16/2019   Procedure: SHOULDER ARTHROSCOPY WITH ROTATOR CUFF REPAIR AND SUBACROMIAL DECOMPRESSION;  Surgeon: Vernetta Lonni GRADE, MD;  Location: [facility];  Service: Orthopedics;  Laterality: Left;   TONSILLECTOMY  1971   TOTAL ABDOMINAL HYSTERECTOMY  1978   TRIGGER FINGER RELEASE Right 12/02/2021   Procedure: RIGHT RING FINGER A-1 PULLEY RELEASE;  Surgeon: Vernetta Lonni GRADE, MD;  Location: [facility];  Service: Orthopedics;  Laterality: Right;   TRIGGER FINGER RELEASE Left 05/11/2023   Procedure: LEFT INDEX FINGER RELEASE TRIGGER FINGER;  Surgeon: Vernetta Lonni GRADE, MD;  Location: [facility];  Service: Orthopedics;  Laterality: Left;   VESICOVAGINAL FISTULA CLOSURE W/ TAH      ROS: Review of Systems Negative except as stated above  PHYSICAL EXAM: BP (!) 153/79 (BP Location: Left Arm, Patient Position: Sitting, Cuff Size: Large)   Pulse 89   Temp 97.8 F (36.6 C) (Oral)   Ht 5' 8 (1.727 m)   Wt 251 lb (113.9 kg)   SpO2 100%   BMI 38.16 kg/m   Physical Exam  General appearance - alert, well appearing, and  in no distress Mental status - normal mood, behavior, speech, dress, motor activity, and thought processes Neck - supple, no significant adenopathy Chest - clear to auscultation, no wheezes, rales or rhonchi, symmetric air entry Heart - normal rate, regular rhythm, normal S1, S2, no murmurs, rubs, clicks or gallops Extremities - peripheral pulses normal, no pedal edema, no clubbing or cyanosis      Latest Ref Rng & Units 11/24/2023    4:40 PM 10/27/2023   12:02 PM 10/12/2023    8:47 AM  CMP  Glucose 70 - 99 mg/dL 880  800  867   BUN 8 - 27 mg/dL 32  25  26   Creatinine 0.57 - 1.00 mg/dL 7.97  8.29  8.36    Sodium 134 - 144 mmol/L 140  145  140   Potassium 3.5 - 5.2 mmol/L 5.2  5.4  5.4   Chloride 96 - 106 mmol/L 103  109  105   CO2 20 - 29 mmol/L 19  19  18    Calcium  8.7 - 10.3 mg/dL 9.4  8.8  9.4    Lipid Panel     Component Value Date/Time   CHOL 175 09/28/2023 1009   TRIG 72 09/28/2023 1009   HDL 100 09/28/2023 1009   CHOLHDL 1.8 09/28/2023 1009   CHOLHDL 2.8 04/28/2016 1028   VLDL 19 04/28/2016 1028   LDLCALC 62 09/28/2023 1009   LDLDIRECT 101.7 07/18/2011 0901    CBC    Component Value Date/Time   WBC 7.6 07/17/2023 1614   WBC 4.7 06/29/2023 0537   RBC 4.13 07/17/2023 1614   RBC 3.38 (L) 06/29/2023 0537   HGB 9.8 (L) 07/17/2023 1614   HCT 33.0 (L) 07/17/2023 1614   PLT 253 07/17/2023 1614   MCV 80 07/17/2023 1614   MCH 23.7 (L) 07/17/2023 1614   MCH 24.6 (L) 06/29/2023 0537   MCHC 29.7 (L) 07/17/2023 1614   MCHC 30.5 06/29/2023 0537   RDW 15.2 07/17/2023 1614   LYMPHSABS 1.0 06/26/2023 1517   MONOABS 0.5 06/26/2023 1517   EOSABS 0.0 06/26/2023 1517   BASOSABS 0.1 06/26/2023 1517    ASSESSMENT AND PLAN: 1. Type 2 diabetes mellitus associated with morbid obesity (HCC) (Primary) At goal based on A1c. She has responded well to Ozempic  so far having lost 11 pounds.  She will continue Ozempic  2 mg once a week and Farxiga  daily.  Dietary counseling given. - POCT glucose (manual entry) - POCT glycosylated hemoglobin (Hb A1C)  2. Long-term (current) use of injectable non-insulin  antidiabetic drugs 3. Diabetes mellitus treated with oral medication (HCC) See #1 above.  4. Hypertension associated with diabetes (HCC) Not at goal.  She has not taken her medicines as yet for today.  Advised of the importance of taking medications daily as prescribed. Continue valsartan  40 mg daily, hydralazine  100 mg 3 times a day, amlodipine  10 mg daily and furosemide  30 mg daily.  Continue home blood pressure monitoring with goal being 130/80 or lower.  5. Bronchitis - azithromycin   (ZITHROMAX  Z-PAK) 250 MG tablet; Take 2 tablets (500 mg total) by mouth daily for 1 day, THEN 1 tablet (250 mg total) daily for 4 days.  Dispense: 6 tablet; Refill: 0 - albuterol  (VENTOLIN  HFA) 108 (90 Base) MCG/ACT inhaler; Inhale 2 puffs into the lungs every 6 (six) hours as needed for wheezing or shortness of breath.  Dispense: 6.7 g; Refill: 0  6. CKD (chronic kidney disease) stage 4, GFR 15-29 ml/min [facility])  Await nephrology  lab results for updated kidney function and potassium levels. Good BP and DM control is important  7. Encounter for screening mammogram for malignant neoplasm of breast - MM 3D SCREENING MAMMOGRAM BILATERAL BREAST; Future  8. Need for influenza vaccination Given today  9. Need for Tdap vaccination Rxn given for her to take to pharmacy - Tdap (ADACEL) 10-26-13.5 LF-MCG/0.5 injection; Inject 0.5 mLs into the muscle once for 1 dose.  Dispense: 0.5 mL; Refill: 0  10. Screening for colon cancer Due for repeat c-scope with Dr. Lovenia - Ambulatory referral to Gastroenterology  Patient was given the opportunity to ask questions.  Patient verbalized understanding of the plan and was able to repeat key elements of the plan.   This documentation was completed using Paediatric nurse.  Any transcriptional errors are unintentional.  Orders Placed This Encounter  Procedures   MM 3D SCREENING MAMMOGRAM BILATERAL BREAST   Flu vaccine HIGH DOSE PF(Fluzone Trivalent)   Ambulatory referral to Gastroenterology   POCT glucose (manual entry)   POCT glycosylated hemoglobin (Hb A1C)     Requested Prescriptions   Signed Prescriptions Disp Refills   Tdap (ADACEL) 10-26-13.5 LF-MCG/0.5 injection 0.5 mL 0    Sig: Inject 0.5 mLs into the muscle once for 1 dose.   azithromycin  (ZITHROMAX  Z-PAK) 250 MG tablet 6 tablet 0    Sig: Take 2 tablets (500 mg total) by mouth daily for 1 day, THEN 1 tablet (250 mg total) daily for 4 days.   albuterol  (VENTOLIN  HFA) 108 (90  Base) MCG/ACT inhaler 6.7 g 0    Sig: Inhale 2 puffs into the lungs every 6 (six) hours as needed for wheezing or shortness of breath.    No follow-ups on file.  Barnie Louder, MD, FACP

## 2024-04-02 NOTE — Telephone Encounter (Signed)
 Pt called requesting a refill of hydrcodone from [place]. Please send to pharmacy on file. Pt phone number is [phone]

## 2024-04-02 NOTE — Patient Instructions (Signed)
  VISIT SUMMARY: Today, you came in for a follow-up visit to manage your chronic conditions, including diabetes, osteoarthritis, chronic kidney disease, and hypertension. We also addressed your persistent cough and discussed your recent weight loss and blood pressure readings.  YOUR PLAN: -ACUTE BRONCHITIS: Acute bronchitis is an inflammation of the bronchial tubes in the lungs, often causing a productive cough with green mucus. We have prescribed antibiotics to treat the infection and an albuterol  inhaler to help with your symptoms.  -TYPE 2 DIABETES MELLITUS WITH DIABETIC POLYNEUROPATHY: Type 2 diabetes is a condition where your body does not use insulin  properly, leading to high blood sugar levels. Your diabetes is well-controlled with your current medications, Ozempic  and Farxiga . Continue taking these medications as prescribed, monitor your blood sugar levels daily, and follow dietary recommendations to manage your blood sugar.  -CHRONIC KIDNEY DISEASE STAGE 3 TO STAGE 4: Chronic kidney disease is a condition where your kidneys gradually lose function. We are awaiting lab results from your recent nephrology visit to assess your kidney function and potassium levels.  -HYPERTENSION: Hypertension, or high blood pressure, is a condition where the force of the blood against your artery walls is too high. Your blood pressure was elevated today, likely due to pain and a missed medication dose. Ensure you take your blood pressure medications as prescribed and recheck your blood pressure today.    INSTRUCTIONS: Please recheck your blood pressure today and take any missed doses of your medications as soon as possible. Continue monitoring your blood sugar levels daily and follow dietary recommendations. Await the lab results from your nephrology visit to assess your kidney function and potassium levels.                      Contains text generated by Abridge.                                  Contains text generated by Abridge.

## 2024-04-08 ENCOUNTER — Ambulatory Visit: Admitting: Podiatry

## 2024-04-08 DIAGNOSIS — B351 Tinea unguium: Secondary | ICD-10-CM

## 2024-04-08 DIAGNOSIS — E1142 Type 2 diabetes mellitus with diabetic polyneuropathy: Secondary | ICD-10-CM

## 2024-04-08 DIAGNOSIS — M79674 Pain in right toe(s): Secondary | ICD-10-CM

## 2024-04-08 DIAGNOSIS — M79675 Pain in left toe(s): Secondary | ICD-10-CM

## 2024-04-08 DIAGNOSIS — N1832 Chronic kidney disease, stage 3b: Secondary | ICD-10-CM

## 2024-04-08 NOTE — Progress Notes (Signed)
 This patient returns to my office for at risk foot care.  This patient requires this care by a professional since this patient will be at risk due to having type 2 diabetes and CKD. This patient is unable to cut nails herself since the patient cannot reach her nails.These nails are painful walking and wearing shoes.  This patient presents for at risk foot care today.  General Appearance  Alert, conversant and in no acute stress.  Vascular  Dorsalis pedis and posterior tibial  pulses are palpable  bilaterally.  Capillary return is within normal limits  bilaterally. Temperature is within normal limits  bilaterally.  Neurologic  Senn-Weinstein monofilament wire test within normal limits  bilaterally. Muscle power within normal limits bilaterally.  Nails Thick disfigured discolored nails with subungual debris  from hallux to fifth toes bilaterally. No evidence of bacterial infection or drainage bilaterally. Malformed right hallux toenail.  Orthopedic  No limitations of motion  feet .  No crepitus or effusions noted.  No bony pathology or digital deformities noted.  Skin  normotropic skin with no porokeratosis noted bilaterally.  No signs of infections or ulcers noted.     Onychomycosis  Pain in right toes  Pain in left toes  Consent was obtained for treatment procedures.   Mechanical debridement of nails 1-5  bilaterally performed with a nail nipper.  Filed with dremel without incident.    Return office visit    prn                 Told patient to return for periodic foot care and evaluation due to potential at risk complications.   Cordella Bold DPM

## 2024-04-10 ENCOUNTER — Other Ambulatory Visit: Payer: Self-pay | Admitting: Nurse Practitioner

## 2024-04-10 DIAGNOSIS — M19011 Primary osteoarthritis, right shoulder: Secondary | ICD-10-CM

## 2024-04-16 ENCOUNTER — Ambulatory Visit
Admission: RE | Admit: 2024-04-16 | Discharge: 2024-04-16 | Disposition: A | Discharge status: Home / Self Care | Source: Ambulatory Visit | Attending: Internal Medicine | Admitting: Internal Medicine

## 2024-04-16 ENCOUNTER — Other Ambulatory Visit: Payer: Self-pay | Admitting: Internal Medicine

## 2024-04-16 DIAGNOSIS — Z1231 Encounter for screening mammogram for malignant neoplasm of breast: Secondary | ICD-10-CM | POA: Diagnosis not present

## 2024-04-20 ENCOUNTER — Ambulatory Visit: Payer: Self-pay | Admitting: Internal Medicine

## 2024-04-23 ENCOUNTER — Ambulatory Visit: Payer: Self-pay | Admitting: Internal Medicine

## 2024-04-23 ENCOUNTER — Telehealth: Admitting: Physician Assistant

## 2024-04-23 DIAGNOSIS — J440 Chronic obstructive pulmonary disease with acute lower respiratory infection: Secondary | ICD-10-CM | POA: Diagnosis not present

## 2024-04-23 DIAGNOSIS — J209 Acute bronchitis, unspecified: Secondary | ICD-10-CM | POA: Diagnosis not present

## 2024-04-23 MED ORDER — BUDESONIDE-FORMOTEROL FUMARATE 160-4.5 MCG/ACT IN AERO: 2.0000 | INHALATION_SPRAY | Freq: Two times a day (BID) | RESPIRATORY_TRACT | 0 refills | Status: AC

## 2024-04-23 MED ORDER — BENZONATATE 100 MG PO CAPS: 100.0000 mg | ORAL_CAPSULE | Freq: Three times a day (TID) | ORAL | 0 refills | Status: DC | PRN

## 2024-04-23 NOTE — Patient Instructions (Signed)
 [name], thank you for joining [name], PA-C for today's virtual visit.  While this provider is not your primary care provider (PCP), if your PCP is located in our provider database this encounter information will be shared with them immediately following your visit.   A [facility] MyChart account gives you access to today's visit and all your visits, tests, and labs performed at [facility]  click here if you don't have a [facility] MyChart account or go to [email or web address]  Consent: (Patient) [name] provided verbal consent for this virtual visit at the beginning of the encounter.  Current Medications:  Current Outpatient Medications:    benzonatate  (TESSALON ) 100 MG capsule, Take 1 capsule (100 mg total) by mouth 3 (three) times daily as needed., Disp: 30 capsule, Rfl: 0   budesonide-formoterol (SYMBICORT) 160-4.5 MCG/ACT inhaler, Inhale 2 puffs into the lungs in the morning and at bedtime., Disp: 10.2 g, Rfl: 0   Accu-Chek Softclix Lancets lancets, Use to check blood sugar 3 times daily., Disp: 100 each, Rfl: 6   albuterol  (VENTOLIN  HFA) 108 (90 Base) MCG/ACT inhaler, Inhale 2 puffs into the lungs every 6 (six) hours as needed for wheezing or shortness of breath., Disp: 6.7 g, Rfl: 0   Alcohol  Swabs (ALCOHOL  PREP) 70 % PADS, Use to prep skin to check blood sugar three times daily., Disp: 100 each, Rfl: 6   amLODipine  (NORVASC ) 10 MG tablet, TAKE 1 TABLET EVERY DAY, Disp: 90 tablet, Rfl: 3   aspirin  EC 81 MG tablet, Take 1 tablet (81 mg total) by mouth daily., Disp: 30 tablet, Rfl: 5   atorvastatin  (LIPITOR ) 80 MG tablet, TAKE 1 TABLET EVERY DAY, Disp: 90 tablet, Rfl: 1   Blood Glucose Monitoring Suppl (ACCU-CHEK GUIDE) w/Device KIT, Use to check blood sugar 3 times daily., Disp: 1 kit, Rfl: 0   Blood Pressure Monitoring (BLOOD PRESSURE CUFF) MISC, Use to check blood pressure daily., Disp: 1 each, Rfl: 0   buPROPion  (WELLBUTRIN  XL) 150 MG  24 hr tablet, Take 1 tablet by mouth twice daily, Disp: 180 tablet, Rfl: 2   dapagliflozin  propanediol (FARXIGA ) 5 MG TABS tablet, Take 1 tablet (5 mg total) by mouth daily before breakfast., Disp: , Rfl:    diclofenac  Sodium (VOLTAREN ) 1 % GEL, APPLY 2 GRAMS TOPICALLY 4 (FOUR) TIMES DAILY AS NEEDED., Disp: 100 g, Rfl: 0   DULoxetine  (CYMBALTA ) 30 MG capsule, TAKE 1 CAPSULE EVERY DAY, Disp: 90 capsule, Rfl: 3   FEROSUL 325 (65 Fe) MG tablet, Take 325 mg by mouth daily with breakfast., Disp: , Rfl:    furosemide  (LASIX ) 20 MG tablet, Take 1.5 tablets (30 mg total) by mouth in the morning., Disp: 135 tablet, Rfl: 1   gabapentin  (NEURONTIN ) 300 MG capsule, Take 1 capsule (300 mg total) by mouth 3 (three) times daily., Disp: 90 capsule, Rfl: 11   glucose blood (ACCU-CHEK GUIDE TEST) test strip, Use to check blood sugar 3 times daily., Disp: 100 each, Rfl: 6   hydrALAZINE  (APRESOLINE ) 100 MG tablet, Take 1 tablet (100 mg total) by mouth 3 (three) times daily., Disp: 270 tablet, Rfl: 1   Insulin  Pen Needle (PEN NEEDLES) 31G X 8 MM MISC, Use to inject Ozempic ., Disp: 100 each, Rfl: 1   letrozole  (FEMARA ) 2.5 MG tablet, Take 1 tablet (2.5 mg total) by mouth daily with lunch., Disp: 90 tablet, Rfl: 3   methocarbamol  (ROBAXIN ) 500 MG tablet, TAKE 1 TABLET EVERY 8 HOURS AS NEEDED FOR MUSCLE  SPASMS., Disp: 270 tablet, Rfl: 1   Multiple Vitamin (MULTIVITAMIN WITH MINERALS) TABS tablet, Take 1 tablet by mouth daily with breakfast., Disp: , Rfl:    pantoprazole  (PROTONIX ) 40 MG tablet, Take 40 mg by mouth daily., Disp: , Rfl:    Semaglutide , 2 MG/DOSE, (OZEMPIC , 2 MG/DOSE,) 8 MG/3ML SOPN, Inject 2 mg into the skin once a week., Disp: 9 mL, Rfl: 2   traZODone  (DESYREL ) 50 MG tablet, TAKE 1 TABLET AT BEDTIME AS NEEDED FOR SLEEP, Disp: 90 tablet, Rfl: 1   valsartan  (DIOVAN ) 40 MG tablet, Take 1 tablet (40 mg total) by mouth daily., Disp: 90 tablet, Rfl: 1   Medications ordered in this encounter:  Meds ordered this  encounter  Medications   budesonide-formoterol (SYMBICORT) 160-4.5 MCG/ACT inhaler    Sig: Inhale 2 puffs into the lungs in the morning and at bedtime.    Dispense:  10.2 g    Refill:  0    Supervising Provider:   BLAISE ALEENE KIDD [id number]   benzonatate  (TESSALON ) 100 MG capsule    Sig: Take 1 capsule (100 mg total) by mouth 3 (three) times daily as needed.    Dispense:  30 capsule    Refill:  0    Supervising Provider:   BLAISE ALEENE KIDD [[id number]]     *If you need refills on other medications prior to your next appointment, please contact your pharmacy*  Follow-Up: Call back or seek an in-person evaluation if the symptoms worsen or if the condition fails to improve as anticipated.  [facility] [phone]  Other Instructions  Budesonide; Formoterol Metered Dose Inhaler (MDI) What is this medication? BUDESONIDE; FORMOTEROL (byoo DES oh nide; for MOH te rol) treats asthma and chronic obstructive pulmonary disease (COPD). It works by opening the airways of the lungs, making it easier to breathe. It is a combination of an inhaled steroid and a bronchodilator. This medicine may be used for other purposes; ask your health care provider or pharmacist if you have questions. COMMON BRAND NAME(S): Breyna, Symbicort What should I tell my care team before I take this medication? They need to know if you have any of these conditions: Diabetes Eye disease, such as glaucoma, cataracts, or blurred vision Heart disease High blood pressure Immune system problems Infection Irregular heartbeat or rhythm Liver disease Osteoporosis, weak bones Pheochromocytoma Seizures Thyroid  disease An unusual or allergic reaction to budesonide, formoterol, other medications, foods, dyes, or preservatives Pregnant or trying to get pregnant Breastfeeding How should I use this medication? This medication is inhaled through the mouth. Shake well before using. Rinse your mouth with water  after use. Make sure not to swallow the water. Take it as directed on the prescription label. Do not use it more often than directed. Keep taking it unless your care team tells you to stop. This medication comes with INSTRUCTIONS FOR USE. Ask your pharmacist for directions on how to use this medication. Read the information carefully. Talk to your pharmacist or care team if you have questions. Talk to your care team about the use of this medication in children. While it may be prescribed to children as young as 6 years for selected conditions, precautions do apply. Overdosage: If you think you have taken too much of this medicine contact a poison control center or emergency room at once. NOTE: This medicine is only for you. Do not share this medicine with others. What if I miss a dose? If you miss a dose, use it  as soon as you can. If it is almost time for your next dose, use only that dose. Do not use double or extra doses. What may interact with this medication? Do not take the medication with any of the following: Cisapride Dronedarone Other medications that contain long-acting beta agonists (LABAs), such as arformoterol, formoterol, indacaterol, olodaterol, salmeterol, vilanterol Pimozide Thioridazine This medication may also interact with the following: Certain antibiotics, such as clarithromycin or telithromycin Certain antivirals for HIV or hepatitis Certain heart medications, such as atenolol or metoprolol  Certain medications for blood pressure, heart disease, irregular heartbeat Certain medications for depression, anxiety, or other mental health conditions Certain medications for fungal infections, such as ketoconazole or itraconazole Diuretics Grapefruit juice MAOIs, such as Marplan, Nardil, and Parnate Mifepristone Other medications that cause heart rhythm changes Some vaccines Steroid medications, such as prednisone  or cortisone Stimulant medications for attention disorders,  weight loss, or staying awake Theophylline This list may not describe all possible interactions. Give your health care provider a list of all the medicines, herbs, non-prescription drugs, or dietary supplements you use. Also tell them if you smoke, drink alcohol , or use illegal drugs. Some items may interact with your medicine. What should I watch for while using this medication? Visit your care team for regular checks on your progress. Tell your care team if your symptoms do not start to get better or if they get worse. Follow the plan from your care team for treating an acute asthma attack or bronchospasm (wheezing). If your symptoms get worse or do not get better, call your care team right away. If you have asthma, you and your care team should develop an Asthma Action Plan that is just for you. Be sure to know what to do if you are in the yellow (asthma is getting worse) or red (medical alert) zones. Do not treat yourself for coughs, colds or allergies without asking your care team for advice. Some nonprescription medications can affect this one. This medication may increase your risk of getting an infection. Call your care team for advice if you get a fever, chills, sore throat, or other symptoms of a cold or flu. Do not treat yourself. Try to avoid being around people who are sick. If you have not had the measles or chickenpox vaccines, tell your care team right away if you are around someone with these viruses. Using this medication for a long time may weaken your bones. The risk of bone fractures may be increased. Talk to your care team about your bone health. This medication may slow your child's growth if it is taken for a long time at high doses. Your care team will monitor your child's growth. What side effects may I notice from receiving this medication? Side effects that you should report to your care team as soon as possible: Allergic reactions--skin rash, itching, hives, swelling of the  face, lips, tongue, or throat Flu-like symptoms--fever, chills, muscle pain, cough, headache, fatigue Heart rhythm changes--fast or irregular heartbeat, dizziness, feeling faint or lightheaded, chest pain, trouble breathing Increase in blood pressure Low adrenal gland function--nausea, vomiting, loss of appetite, unusual weakness or fatigue, dizziness Muscle pain or cramps Pain, tingling, or numbness in the hands or feet Sinus pain or pressure around the face or forehead Thrush--white patches in the mouth Wheezing or trouble breathing that is worse after use Side effects that usually do not require medical attention (report these to your care team if they continue or are bothersome): Change in taste Cough  Dry mouth Headache Hoarseness Sore throat Tremors or shaking Trouble sleeping This list may not describe all possible side effects. Call your doctor for medical advice about side effects. You may report side effects to FDA at 1-800-FDA-1088. Where should I keep my medication? Keep out of the reach of children and pets. Store at room temperature between 20 and 25 degrees C (68 and 77 degrees F). Keep inhaler away from extreme heat, cold or humidity. This medication is flammable. Avoid exposure to heat, fire, flame, and smoking. Throw away 3 months after removing it from the foil pouch, when the dose counter reads 0 or after the expiration date, whichever is first. To get rid of medications that are no longer needed or have expired: Take the medication to a medication take-back program. Check with your pharmacy or law enforcement to find a location. If you cannot return the medication, ask your pharmacist or care team how to get rid of this medication safely. NOTE: This sheet is a summary. It may not cover all possible information. If you have questions about this medicine, talk to your doctor, pharmacist, or health care provider.  2025 Elsevier/Gold Standard (2023-08-28  00:00:00)   If you have been instructed to have an in-person evaluation today at a local Urgent Care facility, please use the link below. It will take you to a list of all of our available [facility], including address, phone number and hours of operation. Please do not delay care.  [facility]  If you or a family member do not have a primary care provider, use the link below to schedule a visit and establish care. When you choose a [facility] primary care physician or advanced practice provider, you gain a long-term partner in health. Find a Primary Care Provider  Learn more about [facility] in-office and virtual care options: [facility] - Get Care Now

## 2024-04-23 NOTE — Progress Notes (Signed)
 Virtual Visit Consent   [name], you are scheduled for a virtual visit with a [facility] provider today. Just as with appointments in the office, your consent must be obtained to participate. Your consent will be active for this visit and any virtual visit you may have with one of our providers in the next 365 days. If you have a MyChart account, a copy of this consent can be sent to you electronically.  As this is a virtual visit, video technology does not allow for your provider to perform a traditional examination. This may limit your provider's ability to fully assess your condition. If your provider identifies any concerns that need to be evaluated in person or the need to arrange testing (such as labs, EKG, etc.), we will make arrangements to do so. Although advances in technology are sophisticated, we cannot ensure that it will always work on either your end or our end. If the connection with a video visit is poor, the visit may have to be switched to a telephone visit. With either a video or telephone visit, we are not always able to ensure that we have a secure connection.  By engaging in this virtual visit, you consent to the provision of healthcare and authorize for your insurance to be billed (if applicable) for the services provided during this visit. Depending on your insurance coverage, you may receive a charge related to this service.  I need to obtain your verbal consent now. Are you willing to proceed with your visit today? [name] has provided verbal consent on 04/23/2024 for a virtual visit (video or telephone). [name], PA-C  Date: 04/23/2024 6:15 PM   Virtual Visit via Video Note   I, [name], connected with  [name]  ([id number], 1950-09-18) on 04/23/24 at  6:00 PM EDT by a video-enabled telemedicine application and verified that I am speaking with the correct person using two identifiers.  Location: Patient: Virtual Visit  Location Patient: Home Provider: Virtual Visit Location Provider: Home Office   I discussed the limitations of evaluation and management by telemedicine and the availability of in person appointments. The patient expressed understanding and agreed to proceed.    History of Present Illness: [name] is a 73 y.o. who identifies as a female who was assigned female at birth, and is being seen today for chronic cough.  HPI: Cough This is a chronic problem. The current episode started more than 1 month ago (Had acute bronchitis on chronic bronchitis/COPD flare a couple months ago, given Zpack. Had improvement of mucus discoloration and thickness, but cough remains). The problem has been unchanged. The problem occurs every few hours. The cough is Productive of sputum (clear sputum). Associated symptoms include shortness of breath and wheezing. Pertinent negatives include no chest pain, chills, fever, nasal congestion, postnasal drip or rhinorrhea. The symptoms are aggravated by lying down and cold air. She has tried a beta-agonist inhaler for the symptoms. The treatment provided mild relief. Her past medical history is significant for bronchitis and COPD.     Problems:  Patient Active Problem List   Diagnosis Date Noted   Pneumonia 06/26/2023   Colonic mass 06/08/2023   Chronic right shoulder pain 03/28/2023   Neck pain 03/28/2023   Primary osteoarthritis of right shoulder 08/10/2022   CAP (community acquired pneumonia) 06/22/2022   Stage 3b chronic kidney disease (CKD) (HCC) 06/22/2022   Trigger finger, left index finger 12/02/2021   DM type 2  with diabetic peripheral neuropathy (HCC) 07/26/2021   Bilateral low back pain with bilateral sciatica 07/26/2021   Gait abnormality 07/26/2021   Controlled substance agreement signed 12/31/2020   Shoulder stiffness, left 07/07/2020   Status post lumbar spine surgery for decompression of spinal cord 06/09/2020   Lumbar stenosis 05/27/2020    Spinal stenosis of lumbar region 05/21/2019   Complete tear of left rotator cuff 05/16/2019   Uncontrolled type 2 diabetes mellitus with hyperglycemia (HCC) 12/19/2018   Microalbuminuria 04/08/2018   Genetic testing 09/25/2017   Family history of breast cancer    Family history of prostate cancer    Malignant neoplasm of upper-outer quadrant of right breast in female, estrogen receptor positive (HCC) 06/26/2017   Lumbar herniated disc 04/15/2015   Abnormal nuclear stress test 04/03/2015   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   CAD (coronary artery disease), native coronary artery 03/30/2015   COPD (chronic obstructive pulmonary disease) (HCC) 12/24/2013   S/P mitral valve repair 11/16/2010   Pure hypercholesterolemia 06/29/2010   Anemia of renal disease 09/29/2006   Essential hypertension 09/29/2006   Diabetes mellitus without complication (HCC) 09/29/2006    Allergies:  Allergies  Allergen Reactions   Coreg  [Carvedilol ]     Bradycardia   Medications:  Current Outpatient Medications:    benzonatate  (TESSALON ) 100 MG capsule, Take 1 capsule (100 mg total) by mouth 3 (three) times daily as needed., Disp: 30 capsule, Rfl: 0   budesonide-formoterol (SYMBICORT) 160-4.5 MCG/ACT inhaler, Inhale 2 puffs into the lungs in the morning and at bedtime., Disp: 10.2 g, Rfl: 0   Accu-Chek Softclix Lancets lancets, Use to check blood sugar 3 times daily., Disp: 100 each, Rfl: 6   albuterol  (VENTOLIN  HFA) 108 (90 Base) MCG/ACT inhaler, Inhale 2 puffs into the lungs every 6 (six) hours as needed for wheezing or shortness of breath., Disp: 6.7 g, Rfl: 0   Alcohol  Swabs (ALCOHOL  PREP) 70 % PADS, Use to prep skin to check blood sugar three times daily., Disp: 100 each, Rfl: 6   amLODipine  (NORVASC ) 10 MG tablet, TAKE 1 TABLET EVERY DAY, Disp: 90 tablet, Rfl: 3   aspirin  EC 81 MG tablet, Take 1 tablet (81 mg total) by mouth daily., Disp: 30 tablet, Rfl: 5   atorvastatin  (LIPITOR ) 80 MG tablet,  TAKE 1 TABLET EVERY DAY, Disp: 90 tablet, Rfl: 1   Blood Glucose Monitoring Suppl (ACCU-CHEK GUIDE) w/Device KIT, Use to check blood sugar 3 times daily., Disp: 1 kit, Rfl: 0   Blood Pressure Monitoring (BLOOD PRESSURE CUFF) MISC, Use to check blood pressure daily., Disp: 1 each, Rfl: 0   buPROPion  (WELLBUTRIN  XL) 150 MG 24 hr tablet, Take 1 tablet by mouth twice daily, Disp: 180 tablet, Rfl: 2   dapagliflozin  propanediol (FARXIGA ) 5 MG TABS tablet, Take 1 tablet (5 mg total) by mouth daily before breakfast., Disp: , Rfl:    diclofenac  Sodium (VOLTAREN ) 1 % GEL, APPLY 2 GRAMS TOPICALLY 4 (FOUR) TIMES DAILY AS NEEDED., Disp: 100 g, Rfl: 0   DULoxetine  (CYMBALTA ) 30 MG capsule, TAKE 1 CAPSULE EVERY DAY, Disp: 90 capsule, Rfl: 3   FEROSUL 325 (65 Fe) MG tablet, Take 325 mg by mouth daily with breakfast., Disp: , Rfl:    furosemide  (LASIX ) 20 MG tablet, Take 1.5 tablets (30 mg total) by mouth in the morning., Disp: 135 tablet, Rfl: 1   gabapentin  (NEURONTIN ) 300 MG capsule, Take 1 capsule (300 mg total) by mouth 3 (three) times daily., Disp: 90 capsule, Rfl: 11  glucose blood (ACCU-CHEK GUIDE TEST) test strip, Use to check blood sugar 3 times daily., Disp: 100 each, Rfl: 6   hydrALAZINE  (APRESOLINE ) 100 MG tablet, Take 1 tablet (100 mg total) by mouth 3 (three) times daily., Disp: 270 tablet, Rfl: 1   Insulin  Pen Needle (PEN NEEDLES) 31G X 8 MM MISC, Use to inject Ozempic ., Disp: 100 each, Rfl: 1   letrozole  (FEMARA ) 2.5 MG tablet, Take 1 tablet (2.5 mg total) by mouth daily with lunch., Disp: 90 tablet, Rfl: 3   methocarbamol  (ROBAXIN ) 500 MG tablet, TAKE 1 TABLET EVERY 8 HOURS AS NEEDED FOR MUSCLE SPASMS., Disp: 270 tablet, Rfl: 1   Multiple Vitamin (MULTIVITAMIN WITH MINERALS) TABS tablet, Take 1 tablet by mouth daily with breakfast., Disp: , Rfl:    pantoprazole  (PROTONIX ) 40 MG tablet, Take 40 mg by mouth daily., Disp: , Rfl:    Semaglutide , 2 MG/DOSE, (OZEMPIC , 2 MG/DOSE,) 8 MG/3ML SOPN, Inject 2  mg into the skin once a week., Disp: 9 mL, Rfl: 2   traZODone  (DESYREL ) 50 MG tablet, TAKE 1 TABLET AT BEDTIME AS NEEDED FOR SLEEP, Disp: 90 tablet, Rfl: 1   valsartan  (DIOVAN ) 40 MG tablet, Take 1 tablet (40 mg total) by mouth daily., Disp: 90 tablet, Rfl: 1  Observations/Objective: Patient is well-developed, well-nourished in no acute distress.  Resting comfortably at home.  Head is normocephalic, atraumatic.  No labored breathing.  Speech is clear and coherent with logical content.  Patient is alert and oriented at baseline.    Assessment and Plan: 1. Acute bronchitis with COPD (HCC) (Primary) - budesonide-formoterol (SYMBICORT) 160-4.5 MCG/ACT inhaler; Inhale 2 puffs into the lungs in the morning and at bedtime.  Dispense: 10.2 g; Refill: 0 - benzonatate  (TESSALON ) 100 MG capsule; Take 1 capsule (100 mg total) by mouth 3 (three) times daily as needed.  Dispense: 30 capsule; Refill: 0  - Chronic cough worsened over last few months following an acute bronchitis episode - Has been using Albuterol  without full relief - Add Symbicort  - Tessalon  perles for acute cough as needed - Continue Albuterol  as needed for chest tightness, shortness of breath, or wheezing - Follow up with PCP for further evaluation of COPD progression  Follow Up Instructions: I discussed the assessment and treatment plan with the patient. The patient was provided an opportunity to ask questions and all were answered. The patient agreed with the plan and demonstrated an understanding of the instructions.  A copy of instructions were sent to the patient via MyChart unless otherwise noted below.    The patient was advised to call back or seek an in-person evaluation if the symptoms worsen or if the condition fails to improve as anticipated.    [name], PA-C

## 2024-04-23 NOTE — Telephone Encounter (Signed)
 FYI Only or Action Required?: FYI only for provider.  Patient was last seen in primary care on 04/02/2024 by Vicci Barnie NOVAK, MD.  Called Nurse Triage reporting Cough.  Symptoms began several months ago.  Interventions attempted: Prescription medications: azithromycin  (ZITHROMAX  Z-PAK) 250 MG tablet.  Symptoms are: unchanged.  Triage Disposition: See HCP Within 4 Hours (Or PCP Triage)  Patient/caregiver understands and will follow disposition?: Yes            Copied from CRM [id number]. Topic: Clinical - Red Word Triage >> Apr 23, 2024  2:14 PM Willma R wrote: Red Word that prompted transfer to Nurse Triage: Patient states she has a rattling in her chest when she breathes, wheezing, and a productive cough. Says she has had these symptoms on a off for years but they have recently gotten worse and it causes her fatigue having to deal with them. Reason for Disposition  Wheezing is present  Answer Assessment - Initial Assessment Questions This RN scheduled pt for a virtual urgent care visit today. This RN educated pt on new-worsening symptoms and when to call back. Pt verbalized understanding and agrees to plan.    Productive cough- was thick and green; pt was given an antibiotic by Dr. Vicci beginning of Oct; now is clear in color but still coughing Pt states she has a hx of COPD Rattling in chest with inhalation  Pt was to be prescribed a nebulizer with liquid albuterol  as pt states the inhaler is not really working  ONSET: When did the cough begin?      Constant for a few years during the winter  Protocols used: Cough - Acute Productive-A-AH

## 2024-04-29 ENCOUNTER — Other Ambulatory Visit (INDEPENDENT_AMBULATORY_CARE_PROVIDER_SITE_OTHER): Payer: Self-pay

## 2024-04-29 ENCOUNTER — Encounter: Payer: Self-pay | Admitting: Radiology

## 2024-04-29 ENCOUNTER — Ambulatory Visit: Admitting: Sports Medicine

## 2024-04-29 ENCOUNTER — Encounter: Payer: Self-pay | Admitting: Sports Medicine

## 2024-04-29 DIAGNOSIS — E66812 Obesity, class 2: Secondary | ICD-10-CM

## 2024-04-29 DIAGNOSIS — M1711 Unilateral primary osteoarthritis, right knee: Secondary | ICD-10-CM

## 2024-04-29 DIAGNOSIS — M23203 Derangement of unspecified medial meniscus due to old tear or injury, right knee: Secondary | ICD-10-CM | POA: Diagnosis not present

## 2024-04-29 DIAGNOSIS — M23300 Other meniscus derangements, unspecified lateral meniscus, right knee: Secondary | ICD-10-CM

## 2024-04-29 DIAGNOSIS — M25461 Effusion, right knee: Secondary | ICD-10-CM

## 2024-04-29 DIAGNOSIS — Z6839 Body mass index (BMI) 39.0-39.9, adult: Secondary | ICD-10-CM | POA: Diagnosis not present

## 2024-04-29 MED ORDER — LIDOCAINE HCL 1 % IJ SOLN
5.0000 mL | INTRAMUSCULAR | Status: AC | PRN
  Administered 2024-04-29: 5 mL

## 2024-04-29 MED ORDER — METHYLPREDNISOLONE ACETATE 40 MG/ML IJ SUSP
40.0000 mg | INTRAMUSCULAR | Status: AC | PRN
  Administered 2024-04-29: 40 mg via INTRA_ARTICULAR

## 2024-04-29 MED ORDER — BUPIVACAINE HCL 0.25 % IJ SOLN
3.0000 mL | INTRAMUSCULAR | Status: AC | PRN
  Administered 2024-04-29: 3 mL via INTRA_ARTICULAR

## 2024-04-29 NOTE — Progress Notes (Signed)
 Patient says that she has been having more pain and swelling in the right knee. She is asking about how much the torn meniscus may be contributing to her pain. She did have her appointment and was fitted for a brace, but has not gotten the brace yet.

## 2024-04-29 NOTE — Progress Notes (Signed)
 [name] - 73 y.o. female MRN [id number]  Date of birth: 1951-02-01  Office Visit Note: Visit Date: 04/29/2024 PCP: Vicci Barnie NOVAK, MD Referred by: Vicci Barnie NOVAK, MD  Subjective: Chief Complaint  Patient presents with   Right Knee - Follow-up   HPI: [name] is a pleasant 73 y.o. female who presents today for acute on chronic right knee.  Betsi continues to have trouble with the right knee.  She is ambulating today with a cane and not in a wheelchair which is promising.  We have aspirated the knee on separate occasions but her swelling returns.  She is having more pain over the lateral aspect of the knee.  She did meet with our [employer] rep, Aleck, for a custom lateral knee unloader to get fitted, but has not had brace placed yet.  For her pain she is using gabapentin  300 mg 3 times daily, as well as Robaxin  500 mg once to twice daily which is helpful somewhat.  Last weight was 258 pounds, BMI 39.23  Lab Results  Component Value Date   HGBA1C 6.7 04/02/2024   Treatments tried since July 2025: - Physical therapy - Over-the-counter medications; other medications including Robaxin  and gabapentin  - Knee corticosteroid injections, knee aspirations, viscosupplementation injection  Pertinent ROS were reviewed with the patient and found to be negative unless otherwise specified above in HPI.   Assessment & Plan: Visit Diagnoses:  1. Unilateral primary osteoarthritis, right knee   2. Effusion, right knee   3. Degenerative tear of lateral meniscus of right knee   4. Degenerative tear of medial meniscus of right knee    Plan: Impression is acute on chronic right knee pain with recurrent effusion.  Does have advanced tricompartmental arthritic change with lateral joint line collapse instability.  MRI does confirm degenerative meniscal tearing both medially and laterally as well.  She has only received temporary relief from oral medications, over-the-counter  anti-inflammatories, corticosteroid injection, viscosupplementation.  For her acute pain and effusion, we did perform ultrasound-guided aspiration and subsequent injection, per patient request.  She has had rather bothersome pain since July and at this point we are looking for a more permanent solution.  I do think she is at the point where she would like to move forward with knee replacement discussion.  I will have her make an appointment with Dr. [name] in 1 month.  I did encourage her to continue losing weight to meet BMI criteria, she has done so and will work on this.  For pain control, she can continue her Robaxin  500 mg once to twice daily as needed as well as her gabapentin  300 mg BID-TID.  Follow-up: Return in about 1 month (around 05/29/2024) for Evaluation R-knee with Dr. [name].   Meds & Orders: No orders of the defined types were placed in this encounter.   Orders Placed This Encounter  Procedures   Large Joint Inj   US  Extrem Low Right Ltd     Procedures: Large Joint Inj: R knee on 04/29/2024 10:57 AM Indications: joint swelling and pain Details: 18 G 1.5 in needle, ultrasound-guided superolateral approach Medications: 5 mL lidocaine  1 %; 3 mL bupivacaine  0.25 %; 40 mg methylPREDNISolone  acetate 40 MG/ML Aspirate: clear and yellow; sent for lab analysis Outcome: tolerated well, no immediate complications  US -guided Knee Aspiration, Right: After discussion on risks/benefits/indications was provided, informed verbal consent was obtained and a timeout was performed, patient was lying supine on exam table with knee bolster pillow in  place. The knee was prepped with Chloraprep and multiple alcohol  swabs. Utilizing superolateral approach, approximately 5 mL of lidocaine  1% was used for local anesthesia. Then using ultrasound guidance via a transverse-axis and in-plane approach, an 18g, 1.5 needle was inserted and 29 mL of clear straw-colored fluid was aspirated from the knee joint. Utilizing  the same portal and a sterile syringe swap, the knee joint was then  injected under ultrasound guidance with 3:1 bupivicaine:depomedrol with flow of the injectate visualized going into the knee joint.  Patient tolerated procedure well without immediate complications.  Procedure, treatment alternatives, risks and benefits explained, specific risks discussed. Consent was given by the patient. Immediately prior to procedure a time out was called to verify the correct patient, procedure, equipment, support staff and site/side marked as required. Patient was prepped and draped in the usual sterile fashion.          Clinical History: No specialty comments available.  She reports that she quit smoking about 13 years ago. Her smoking use included cigarettes. She has never been exposed to tobacco smoke. She has never used smokeless tobacco.  Recent Labs    06/27/23 1423 01/02/24 1426 04/02/24 1532  HGBA1C 8.5* 7.5* 6.7    Objective:    Physical Exam  Gen: Well-appearing, in no acute distress; non-toxic CV: Well-perfused. Warm.  Resp: Breathing unlabored on room air; no wheezing. Psych: Fluid speech in conversation; appropriate affect; normal thought process  Ortho Exam - Right knee: There is a moderate effusion present on the knee with mild warmth of the knee although no redness.  Range of motion is restricted from about 3-115 degrees compared to 0-125 degrees of the contralateral knee.  Imaging:  Narrative & Impression  MR KNEE WITHOUT IV CONTRAST RIGHT   COMPARISON: None.   CLINICAL HISTORY: Chronic knee pain.   PULSE SEQUENCES: Ax PD FS, Sag T2 ACL, Sag PD FS, Cor PD FS & COR T1   FINDINGS: Bones: There is moderate tricompartmental osteoarthrosis with moderate chondromalacia most notably the lateral compartment. There is mild subchondral reactive edema in the lateral femoral condyle. There is a moderate size reactive joint effusion and leaking Baker's cyst. Denser mechanism  is intact.   Ligaments: There is thickening and increased signal in the ACL likely reflective of mucoid degeneration. The PCL is somewhat irregular centrally suggesting a partial tear. There are intact fibers suspected. Correlation for PCL stability. There is chronic thickening of the femoral origin of the MCL consistent with a chronic sprain. There are intact fibers. The fibular collateral ligament is intact.   Menisci: There is a radial tear at the root of the medial meniscus with a diminutive body and posterior horn. No displaced meniscal flap is identified. There is likely degenerative tear of the body of the lateral meniscus. No displaced meniscal tear is present.   IMPRESSION: Moderate tricompartmental osteoarthrosis with significant chondromalacia and mild subchondral reactive edema in the lateral femoral condyle. There is a small reactive joint effusion and leaking Baker's cyst.   Thickening and increased signal in the ACL likely reflective of mucoid degeneration. There is irregularity in a possible partial tear of the PCL. Correlation for PCL stability.   Complex degenerative tears of the body of the lateral meniscus and medial meniscus. There is a diminutive posterior horn of the medial meniscus. No displaced meniscal flap is appreciated.   Electronically signed by: Norleen Satchel MD 02/17/2024 11:28 AM EDT RP Workstation: [id number]    Past Medical/Family/Surgical/Social History: Medications & Allergies reviewed  per EMR, new medications updated. Patient Active Problem List   Diagnosis Date Noted   Pneumonia 06/26/2023   Colonic mass 06/08/2023   Chronic right shoulder pain 03/28/2023   Neck pain 03/28/2023   Primary osteoarthritis of right shoulder 08/10/2022   CAP (community acquired pneumonia) 06/22/2022   Stage 3b chronic kidney disease (CKD) (HCC) 06/22/2022   Trigger finger, left index finger 12/02/2021   DM type 2 with diabetic peripheral neuropathy (HCC)  07/26/2021   Bilateral low back pain with bilateral sciatica 07/26/2021   Gait abnormality 07/26/2021   Controlled substance agreement signed 12/31/2020   Shoulder stiffness, left 07/07/2020   Status post lumbar spine surgery for decompression of spinal cord 06/09/2020   Lumbar stenosis 05/27/2020   Spinal stenosis of lumbar region 05/21/2019   Complete tear of left rotator cuff 05/16/2019   Uncontrolled type 2 diabetes mellitus with hyperglycemia (HCC) 12/19/2018   Microalbuminuria 04/08/2018   Genetic testing 09/25/2017   Family history of breast cancer    Family history of prostate cancer    Malignant neoplasm of upper-outer quadrant of right breast in female, estrogen receptor positive (HCC) 06/26/2017   Lumbar herniated disc 04/15/2015   Abnormal nuclear stress test 04/03/2015   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   CAD (coronary artery disease), native coronary artery 03/30/2015   COPD (chronic obstructive pulmonary disease) (HCC) 12/24/2013   S/P mitral valve repair 11/16/2010   Pure hypercholesterolemia 06/29/2010   Anemia of renal disease 09/29/2006   Essential hypertension 09/29/2006   Diabetes mellitus without complication (HCC) 09/29/2006   Past Medical History:  Diagnosis Date   Abscess in epidural space of L2-L5 lumbar spine 03/2006   Anemia, iron  deficiency    Arthritis    Breast cancer (HCC) 06/12/2017   right breast   CAD (coronary artery disease), native coronary artery 03/30/2015   Cataract    Colon polyp    a. Multiple colonic polyps status post colonoscopy in October 2007, consistent with tubular adenoma, tubulovillous adenoma with no high-grade dysplasia or malignancy identified.    Constipation    Coronary artery disease    a. s/p CABG 06/2010: S-D2; S-PDA (at time of MV surgery).   Depression    Diabetes mellitus    type II   Family history of breast cancer    Family history of prostate cancer    Genetic testing 09/25/2017   Multi-Cancer  panel (83 genes) @ [facility] - No pathogenic mutations detected   GERD (gastroesophageal reflux disease)    Hx of transient ischemic attack (TIA)    a. See stroke section.   Hyperlipidemia    Hypertension    Hypoxia    a. Has history of acute hypoxic respiratory failure in the setting of bronchitis/PNA or prior admissions.   MRSA infection    a. History of recurrent skin infection and soft tissue abscesses, with MRSA positive in the past.   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 mitral valve - s/p resection and MV repair 06/2010 Dr. Dusty.   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 Distal R MCA.   Stroke [facility])    a. 11/2009: mitral mass diagnosed at this time, also has distal R MCA stenosis, tx with coumadin . b. Readmitted 05/2010 with TIA symptoms - had not been taking Coumadin . s/p MV surgery 06/2010. Coumadin  stopped  2013 after review of chart by Dr. Pietro since mass was removed (stroke felt possibly related to this).    Family History  Problem Relation Age of Onset   Colon cancer Mother        dx 30s; deceased 13   Irritable bowel syndrome Mother    Prostate cancer Father        deceased 2s   Cirrhosis Sister        died of GI bleed associated with cirrhosis of the liver   Stroke Brother    Prostate cancer Brother 32       deceased 28   Diabetes Brother        x3   Kidney disease Brother    Cancer Brother 43       unk. type   Breast cancer Cousin        several maternal 1st and 2nd cousins with breast cancer   Rectal cancer Neg Hx    Stomach cancer Neg Hx    Esophageal cancer Neg Hx    Past Surgical History:  Procedure Laterality Date   BREAST BIOPSY Right 08/26/2019   benign   BREAST LUMPECTOMY Right    BREAST LUMPECTOMY WITH RADIOACTIVE SEED AND SENTINEL LYMPH NODE BIOPSY Right 07/20/2017   Procedure: RIGHT BREAST LUMPECTOMY WITH  RADIOACTIVE SEED AND SENTINEL LYMPH NODE BIOPSY;  Surgeon: Ethyl Lenis, MD;  Location: [facility] OR;  Service: General;  Laterality: Right;   CARDIAC CATHETERIZATION  04/03/2015   Procedure: Left Heart Cath and Cors/Grafts Angiography;  Surgeon: Peter M Jordan, MD;  Location: [facility];  Service: Cardiovascular;;   CORONARY ARTERY BYPASS GRAFT  1/12   history of ankle fractures requiring surgery     LUMBAR LAMINECTOMY/DECOMPRESSION MICRODISCECTOMY Left 04/15/2015   Procedure: LUMBAR LAMINECTOMY/DECOMPRESSION MICRODISCECTOMY 1 LEVEL;  Surgeon: Reyes Budge, MD;  Location: [facility];  Service: Neurosurgery;  Laterality: Left;  Left L23 microdiskectomy   LUMBAR LAMINECTOMY/DECOMPRESSION MICRODISCECTOMY N/A 05/27/2020   Procedure: L2-3, L3-4  decompression;  Surgeon: Barbarann Oneil BROCKS, MD;  Location: [facility] OR;  Service: Orthopedics;  Laterality: N/A;   MV repair and resection of mass  1/12   RESECTION DISTAL CLAVICAL Left 05/16/2019   Procedure: RESECTION DISTAL CLAVICAL;  Surgeon: Vernetta Lonni GRADE, MD;  Location: [facility];  Service: Orthopedics;  Laterality: Left;   SHOULDER ARTHROSCOPY WITH ROTATOR CUFF REPAIR AND SUBACROMIAL DECOMPRESSION Left 05/16/2019   Procedure: SHOULDER ARTHROSCOPY WITH ROTATOR CUFF REPAIR AND SUBACROMIAL DECOMPRESSION;  Surgeon: Vernetta Lonni GRADE, MD;  Location: [facility];  Service: Orthopedics;  Laterality: Left;   TONSILLECTOMY  1971   TOTAL ABDOMINAL HYSTERECTOMY  1978   TRIGGER FINGER RELEASE Right 12/02/2021   Procedure: RIGHT RING FINGER A-1 PULLEY RELEASE;  Surgeon: Vernetta Lonni GRADE, MD;  Location: [facility];  Service: Orthopedics;  Laterality: Right;   TRIGGER FINGER RELEASE Left 05/11/2023   Procedure: LEFT INDEX FINGER RELEASE TRIGGER FINGER;  Surgeon: Vernetta Lonni GRADE, MD;  Location: [facility];  Service: Orthopedics;  Laterality: Left;   VESICOVAGINAL FISTULA CLOSURE W/ TAH      Social History   Occupational History    Employer: UNEMPLOYED  Tobacco Use   Smoking status: Former    Current packs/day: 0.00    Types: Cigarettes    Quit date: 10/14/2010    Years since quitting: 13.5    Passive exposure: Never   Smokeless tobacco: Never  Vaping Use   Vaping status: Never Used  Substance and Sexual Activity   Alcohol  use: Yes    Comment: social   Drug use: Not Currently    Types: Marijuana   Sexual activity: Not Currently

## 2024-05-01 ENCOUNTER — Other Ambulatory Visit: Payer: Self-pay

## 2024-05-06 ENCOUNTER — Other Ambulatory Visit: Payer: Self-pay | Admitting: Internal Medicine

## 2024-05-06 DIAGNOSIS — I2510 Atherosclerotic heart disease of native coronary artery without angina pectoris: Secondary | ICD-10-CM | POA: Diagnosis not present

## 2024-05-06 DIAGNOSIS — Z9989 Dependence on other enabling machines and devices: Secondary | ICD-10-CM | POA: Diagnosis not present

## 2024-05-06 DIAGNOSIS — Z79811 Long term (current) use of aromatase inhibitors: Secondary | ICD-10-CM | POA: Diagnosis not present

## 2024-05-06 DIAGNOSIS — J449 Chronic obstructive pulmonary disease, unspecified: Secondary | ICD-10-CM | POA: Diagnosis not present

## 2024-05-06 DIAGNOSIS — I129 Hypertensive chronic kidney disease with stage 1 through stage 4 chronic kidney disease, or unspecified chronic kidney disease: Secondary | ICD-10-CM | POA: Diagnosis not present

## 2024-05-06 DIAGNOSIS — R001 Bradycardia, unspecified: Secondary | ICD-10-CM | POA: Diagnosis not present

## 2024-05-06 DIAGNOSIS — F419 Anxiety disorder, unspecified: Secondary | ICD-10-CM | POA: Diagnosis not present

## 2024-05-06 DIAGNOSIS — M1990 Unspecified osteoarthritis, unspecified site: Secondary | ICD-10-CM | POA: Diagnosis not present

## 2024-05-06 DIAGNOSIS — E1122 Type 2 diabetes mellitus with diabetic chronic kidney disease: Secondary | ICD-10-CM | POA: Diagnosis not present

## 2024-05-06 DIAGNOSIS — G4700 Insomnia, unspecified: Secondary | ICD-10-CM | POA: Diagnosis not present

## 2024-05-06 DIAGNOSIS — I272 Pulmonary hypertension, unspecified: Secondary | ICD-10-CM | POA: Diagnosis not present

## 2024-05-06 DIAGNOSIS — E782 Mixed hyperlipidemia: Secondary | ICD-10-CM

## 2024-05-06 DIAGNOSIS — I700 Atherosclerosis of aorta: Secondary | ICD-10-CM | POA: Diagnosis not present

## 2024-05-06 DIAGNOSIS — G2581 Restless legs syndrome: Secondary | ICD-10-CM | POA: Diagnosis not present

## 2024-05-06 DIAGNOSIS — I422 Other hypertrophic cardiomyopathy: Secondary | ICD-10-CM | POA: Diagnosis not present

## 2024-05-06 DIAGNOSIS — D509 Iron deficiency anemia, unspecified: Secondary | ICD-10-CM | POA: Diagnosis not present

## 2024-05-06 DIAGNOSIS — M8580 Other specified disorders of bone density and structure, unspecified site: Secondary | ICD-10-CM | POA: Diagnosis not present

## 2024-05-06 DIAGNOSIS — Z7985 Long-term (current) use of injectable non-insulin antidiabetic drugs: Secondary | ICD-10-CM | POA: Diagnosis not present

## 2024-05-06 DIAGNOSIS — N189 Chronic kidney disease, unspecified: Secondary | ICD-10-CM | POA: Diagnosis not present

## 2024-05-06 DIAGNOSIS — E1142 Type 2 diabetes mellitus with diabetic polyneuropathy: Secondary | ICD-10-CM | POA: Diagnosis not present

## 2024-05-06 DIAGNOSIS — I358 Other nonrheumatic aortic valve disorders: Secondary | ICD-10-CM | POA: Diagnosis not present

## 2024-05-06 DIAGNOSIS — E785 Hyperlipidemia, unspecified: Secondary | ICD-10-CM | POA: Diagnosis not present

## 2024-05-06 DIAGNOSIS — Z7982 Long term (current) use of aspirin: Secondary | ICD-10-CM | POA: Diagnosis not present

## 2024-05-06 DIAGNOSIS — F325 Major depressive disorder, single episode, in full remission: Secondary | ICD-10-CM | POA: Diagnosis not present

## 2024-05-06 DIAGNOSIS — Z6836 Body mass index (BMI) 36.0-36.9, adult: Secondary | ICD-10-CM | POA: Diagnosis not present

## 2024-05-06 DIAGNOSIS — Z87891 Personal history of nicotine dependence: Secondary | ICD-10-CM | POA: Diagnosis not present

## 2024-05-07 ENCOUNTER — Other Ambulatory Visit: Payer: Self-pay

## 2024-05-07 DIAGNOSIS — I10 Essential (primary) hypertension: Secondary | ICD-10-CM

## 2024-05-07 MED ORDER — VALSARTAN 40 MG PO TABS: 40.0000 mg | ORAL_TABLET | Freq: Every day | ORAL | 1 refills | Status: DC

## 2024-05-09 DIAGNOSIS — M1711 Unilateral primary osteoarthritis, right knee: Secondary | ICD-10-CM | POA: Diagnosis not present

## 2024-05-28 ENCOUNTER — Other Ambulatory Visit: Payer: Self-pay

## 2024-05-28 ENCOUNTER — Ambulatory Visit: Admitting: Orthopaedic Surgery

## 2024-05-28 ENCOUNTER — Telehealth: Payer: Self-pay

## 2024-05-28 ENCOUNTER — Telehealth: Payer: Self-pay | Admitting: Internal Medicine

## 2024-05-28 VITALS — Ht 67.5 in | Wt 254.0 lb

## 2024-05-28 DIAGNOSIS — M1711 Unilateral primary osteoarthritis, right knee: Secondary | ICD-10-CM | POA: Diagnosis not present

## 2024-05-28 NOTE — Telephone Encounter (Signed)
 Patient given surgical clearance forms for cardiology and PCP. Aware that we must receive clearance forms back before being able to proceed with scheduling surgery.

## 2024-05-28 NOTE — Progress Notes (Signed)
 Office Visit Note   Patient: [name]           Date of Birth: [birth year]           MRN: [id number] Visit Date: 05/28/2024              Requested by: Vicci Barnie NOVAK, MD [address] [place],  [place] [address] PCP: Vicci Barnie NOVAK, MD   Assessment & Plan: Visit Diagnoses:  1. Primary osteoarthritis of right knee     Plan: History of Present Illness [name] is a 73 year old female with severe right knee arthritis who presents with persistent knee pain and swelling. She is accompanied by her daughter. She was referred by Dr. Burnetta for evaluation of her knee condition.  She has ongoing knee pain and swelling that have not improved with prior treatments. She received a cortisone injection on November 3 and has had knee fluid aspirated three times, with recurrent effusions. She was fitted with a knee brace but is unsure how to use it.  She has untreated meniscal tears on both sides of the knee, causing significant pain and limiting daily activities.  She takes gabapentin  for pain without benefit and Tylenol  offers minimal relief. She is not on blood thinners and has no history of blood clots.  She has diabetes with a recent A1c of 6.7 and is working on weight control in preparation for surgery.  Physical Exam MUSCULOSKELETAL: Mild valgus alignment.  Lateral joint line tenderness.  Pain with range of motion.  Results LABS HbA1c: 6.7 (November 2025)  Assessment and Plan Right knee severe OA Right total knee arthroplasty planned due to severe osteoarthritis and meniscal tear. Surgery necessary as nonsurgical options exhausted. Explained procedure and recovery expectations. Surgery cannot occur for 90 days post-cortisone injection due to infection risk. - Will schedule right total knee arthroplasty for early February. - Will obtain preoperative clearances from primary care doctor and cardiologist. - Provided handout on knee replacement surgery. -  Plan for outpatient physical therapy post-surgery. - Discussed in-home therapy option if transportation is an issue.  Impression is severe right knee degenerative joint disease secondary to Osteoarthritis.  Patient has attempted conservative treatment for at least 6 consecutive weeks within the past 12 weeks, including but not limited to physical therapy, home exercise program, NSAIDs, activity modification, and/or corticosteroid injections. Despite these efforts, symptoms have not improved or have worsened. Conservative measures have been deemed unsuccessful at this time. After a detailed discussion covering diagnosis and treatment options--including the risks, benefits, alternatives, and potential complications of surgical and nonsurgical management--the patient elected to proceed with surgery  Anticoagulants: No antithrombotic Postop anticoagulation: Eliquis Diabetic: Yes  Nickel allergy: No Prior DVT/PE: No Tobacco use: No Clearances needed for surgery: PCP and cardiology Anticipated discharge dispo: Home   Follow-Up Instructions: No follow-ups on file.   Orders:  No orders of the defined types were placed in this encounter.  No orders of the defined types were placed in this encounter.     Procedures: No procedures performed   Clinical Data: No additional findings.   Subjective: Chief Complaint  Patient presents with   Right Knee - Pain    HPI  Review of Systems  Constitutional: Negative.   HENT: Negative.    Eyes: Negative.   Respiratory: Negative.    Cardiovascular: Negative.   Endocrine: Negative.   Musculoskeletal: Negative.   Neurological: Negative.   Hematological: Negative.   Psychiatric/Behavioral: Negative.  All other systems reviewed and are negative.    Objective: Vital Signs: Ht 5' 7.5 (1.715 m)   Wt 254 lb (115.2 kg)   BMI 39.19 kg/m   Physical Exam Vitals and nursing note reviewed.  Constitutional:      Appearance: She is  well-developed.  HENT:     Head: Atraumatic.     Nose: Nose normal.  Eyes:     Extraocular Movements: Extraocular movements intact.  Cardiovascular:     Pulses: Normal pulses.  Pulmonary:     Effort: Pulmonary effort is normal.  Abdominal:     Palpations: Abdomen is soft.  Musculoskeletal:     Cervical back: Neck supple.  Skin:    General: Skin is warm.     Capillary Refill: Capillary refill takes less than 2 seconds.  Neurological:     Mental Status: She is alert. Mental status is at baseline.  Psychiatric:        Behavior: Behavior normal.        Thought Content: Thought content normal.        Judgment: Judgment normal.     Ortho Exam  Specialty Comments:  No specialty comments available.  Imaging: No results found.   PMFS History: Patient Active Problem List   Diagnosis Date Noted   Primary osteoarthritis of right knee 05/28/2024   Pneumonia 06/26/2023   Colonic mass 06/08/2023   Chronic right shoulder pain 03/28/2023   Neck pain 03/28/2023   Primary osteoarthritis of right shoulder 08/10/2022   CAP (community acquired pneumonia) 06/22/2022   Stage 3b chronic kidney disease (CKD) (HCC) 06/22/2022   Trigger finger, left index finger 12/02/2021   DM type 2 with diabetic peripheral neuropathy (HCC) 07/26/2021   Bilateral low back pain with bilateral sciatica 07/26/2021   Gait abnormality 07/26/2021   Controlled substance agreement signed 12/31/2020   Shoulder stiffness, left 07/07/2020   Status post lumbar spine surgery for decompression of spinal cord 06/09/2020   Lumbar stenosis 05/27/2020   Spinal stenosis of lumbar region 05/21/2019   Complete tear of left rotator cuff 05/16/2019   Uncontrolled type 2 diabetes mellitus with hyperglycemia (HCC) 12/19/2018   Microalbuminuria 04/08/2018   Genetic testing 09/25/2017   Family history of breast cancer    Family history of prostate cancer    Malignant neoplasm of upper-outer quadrant of right breast in  female, estrogen receptor positive (HCC) 06/26/2017   Lumbar herniated disc 04/15/2015   Abnormal nuclear stress test 04/03/2015   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   CAD (coronary artery disease), native coronary artery 03/30/2015   COPD (chronic obstructive pulmonary disease) (HCC) 12/24/2013   S/P mitral valve repair 11/16/2010   Pure hypercholesterolemia 06/29/2010   Anemia of renal disease 09/29/2006   Essential hypertension 09/29/2006   Diabetes mellitus without complication (HCC) 09/29/2006   Past Medical History:  Diagnosis Date   Abscess in epidural space of L2-L5 lumbar spine 03/2006   Anemia, iron  deficiency    Arthritis    Breast cancer (HCC) 06/12/2017   right breast   CAD (coronary artery disease), native coronary artery 03/30/2015   Cataract    Colon polyp    a. Multiple colonic polyps status post colonoscopy in October 2007, consistent with tubular adenoma, tubulovillous adenoma with no high-grade dysplasia or malignancy identified.    Constipation    Coronary artery disease    a. s/p CABG 06/2010: S-D2; S-PDA (at time of MV surgery).   Depression    Diabetes mellitus  type II   Family history of breast cancer    Family history of prostate cancer    Genetic testing 09/25/2017   Multi-Cancer panel (83 genes) @ [facility] - No pathogenic mutations detected   GERD (gastroesophageal reflux disease)    Hx of transient ischemic attack (TIA)    a. See stroke section.   Hyperlipidemia    Hypertension    Hypoxia    a. Has history of acute hypoxic respiratory failure in the setting of bronchitis/PNA or prior admissions.   MRSA infection    a. History of recurrent skin infection and soft tissue abscesses, with MRSA positive in the past.   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 mitral valve - s/p resection and MV repair 06/2010 Dr. Dusty.   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 Distal R MCA.   Stroke [facility])    a. 11/2009: mitral mass diagnosed at this time, also has distal R MCA stenosis, tx with coumadin . b. Readmitted 05/2010 with TIA symptoms - had not been taking Coumadin . s/p MV surgery 06/2010. Coumadin  stopped 2013 after review of chart by Dr. Pietro since mass was removed (stroke felt possibly related to this).     Family History  Problem Relation Age of Onset   Colon cancer Mother        dx 63s; deceased 86   Irritable bowel syndrome Mother    Prostate cancer Father        deceased 41s   Cirrhosis Sister        died of GI bleed associated with cirrhosis of the liver   Stroke Brother    Prostate cancer Brother 44       deceased 45   Diabetes Brother        x3   Kidney disease Brother    Cancer Brother 16       unk. type   Breast cancer Cousin        several maternal 1st and 2nd cousins with breast cancer   Rectal cancer Neg Hx    Stomach cancer Neg Hx    Esophageal cancer Neg Hx     Past Surgical History:  Procedure Laterality Date   BREAST BIOPSY Right 08/26/2019   benign   BREAST LUMPECTOMY Right    BREAST LUMPECTOMY WITH RADIOACTIVE SEED AND SENTINEL LYMPH NODE BIOPSY Right 07/20/2017   Procedure: RIGHT BREAST LUMPECTOMY WITH RADIOACTIVE SEED AND SENTINEL LYMPH NODE BIOPSY;  Surgeon: Ethyl Lenis, MD;  Location: [facility] OR;  Service: General;  Laterality: Right;   CARDIAC CATHETERIZATION  04/03/2015   Procedure: Left Heart Cath and Cors/Grafts Angiography;  Surgeon: Peter M Jordan, MD;  Location: [facility] INVASIVE CV LAB;  Service: Cardiovascular;;   CORONARY ARTERY BYPASS GRAFT  1/12   history of ankle fractures requiring surgery     LUMBAR LAMINECTOMY/DECOMPRESSION MICRODISCECTOMY Left 04/15/2015   Procedure: LUMBAR LAMINECTOMY/DECOMPRESSION MICRODISCECTOMY 1 LEVEL;  Surgeon: Reyes Budge, MD;  Location: [facility]URO ORS;  Service: Neurosurgery;  Laterality: Left;  Left L23 microdiskectomy   LUMBAR  LAMINECTOMY/DECOMPRESSION MICRODISCECTOMY N/A 05/27/2020   Procedure: L2-3, L3-4  decompression;  Surgeon: Barbarann Oneil BROCKS, MD;  Location[facility];  Service: Orthopedics;  Laterality: N/A;   MV repair and resection of mass  1/12   RESECTION DISTAL CLAVICAL Left 05/16/2019   Procedure: RESECTION DISTAL CLAVICAL;  Surgeon: Vernetta Lonni GRADE, MD;  Location: [facility];  Service: Orthopedics;  Laterality: Left;   SHOULDER ARTHROSCOPY WITH ROTATOR CUFF REPAIR AND SUBACROMIAL DECOMPRESSION Left 05/16/2019   Procedure: SHOULDER ARTHROSCOPY WITH ROTATOR CUFF REPAIR AND SUBACROMIAL DECOMPRESSION;  Surgeon: Vernetta Lonni GRADE, MD;  Location: [facility];  Service: Orthopedics;  Laterality: Left;   TONSILLECTOMY  1971   TOTAL ABDOMINAL HYSTERECTOMY  1978   TRIGGER FINGER RELEASE Right 12/02/2021   Procedure: RIGHT RING FINGER A-1 PULLEY RELEASE;  Surgeon: Vernetta Lonni GRADE, MD;  Location: [facility];  Service: Orthopedics;  Laterality: Right;   TRIGGER FINGER RELEASE Left 05/11/2023   Procedure: LEFT INDEX FINGER RELEASE TRIGGER FINGER;  Surgeon: Vernetta Lonni GRADE, MD;  Location: [facility];  Service: Orthopedics;  Laterality: Left;   VESICOVAGINAL FISTULA CLOSURE W/ TAH     Social History   Occupational History    Employer: UNEMPLOYED  Tobacco Use   Smoking status: Former    Current packs/day: 0.00    Types: Cigarettes    Quit date: 10/14/2010    Years since quitting: 13.6    Passive exposure: Never   Smokeless tobacco: Never  Vaping Use   Vaping status: Never Used  Substance and Sexual Activity   Alcohol  use: Yes    Comment: social   Drug use: Not Currently    Types: Marijuana   Sexual activity: Not Currently

## 2024-05-28 NOTE — Telephone Encounter (Addendum)
 Patient's spouse Kitttrell,David dropped off surgical clearance form for cardiology. Form placed in PCP mailbox.

## 2024-05-29 ENCOUNTER — Telehealth: Payer: Self-pay | Admitting: Cardiology

## 2024-05-29 ENCOUNTER — Other Ambulatory Visit: Payer: Self-pay

## 2024-05-29 NOTE — Telephone Encounter (Signed)
 Noted! Thank you

## 2024-05-29 NOTE — Telephone Encounter (Signed)
 Will await for clearance request to be received in our office.

## 2024-05-29 NOTE — Telephone Encounter (Signed)
 Patient called to follow-up on paperwork dropped off to get clearance for knee surgery at [facility], Dr. [name] in February 2026.

## 2024-05-29 NOTE — Telephone Encounter (Signed)
 According to Dr. [name] note, her ortho specialist, surgery planned for early Feb 2026. So she needs pre-op physical with me around the 2nd or 3rd wk of January. Form she dropped off is for her cardiologist. She will need to see her cardiologist sometime in Jan to get pre-op evaluation by her as well. She needs to call and get appt scheduled with them.

## 2024-05-29 NOTE — Telephone Encounter (Addendum)
 Patient has been contacted and everything was explained regarding the need for both the PCP pre-op and cardiology pre-op in January. Patient has been scheduled for pre-op with PCP in the recommend timeframe.07/09/2024 at 2:10 pm. Patient acknowledged appointment.

## 2024-06-03 NOTE — Telephone Encounter (Signed)
 I have sent a secure chat to the surgery scheduler that we have yet to receive a clearance request yet for the pt. Looks like the pt is having surgery with Dr.Xu

## 2024-06-04 NOTE — Telephone Encounter (Signed)
   Pre-operative Risk Assessment    Patient Name: [name]  DOB: 1951-01-15 MRN: [id number]   Date of last office visit: 09/28/23 DR. TURNER Date of next office visit: NONE   Request for Surgical Clearance    Procedure:  RIGHT TOTAL KNEE ARTHROPLASTY  Date of Surgery:  Clearance TBD (SOMETIME 07/2024)                                Surgeon:  DR. KAY OZELL Cummins Surgeon's Group or Practice Name:  [facility]  Phone number:  [phone] Fax number:  [phone]   Type of Clearance Requested:   - Medical  - Pharmacy:  Hold Aspirin      Type of Anesthesia:  Not Indicated ( I reached out to the surgeon office for the type of anesthesia to be used)   Additional requests/questions:    SignedNiels Jest   06/04/2024, 7:52 AM

## 2024-06-04 NOTE — Telephone Encounter (Signed)
 Left message for the pt to call the office and schedule an appt in office for preop clearance.

## 2024-06-04 NOTE — Telephone Encounter (Signed)
   Name: KORTLYNN POUST  DOB: [birth year]  MRN: [id number]  Primary Cardiologist: Wilbert Bihari, MD  Chart reviewed as part of pre-operative protocol coverage. Because of Annastacia C Pfeifle's past medical history and time since last visit, she will require a follow-up in-office visit in order to better assess preoperative cardiovascular risk.  Patient is overdue for follow up visit, will need in office visit for preoperative cardiac evaluation.   Pre-op covering staff: - Please schedule appointment and call patient to inform them. If patient already had an upcoming appointment within acceptable timeframe, please add pre-op clearance to the appointment notes so provider is aware. - Please contact requesting surgeon's office via preferred method (i.e, phone, fax) to inform them of need for appointment prior to surgery.  Regarding ASA therapy, we recommend continuation of ASA throughout the perioperative period.  However, if the surgeon feels that cessation of ASA is required in the perioperative period, it may be stopped 5-7 days prior to surgery with a plan to resume it as soon as felt to be feasible from a surgical standpoint in the post-operative period.   Albirta Rhinehart D Aydrien Froman, NP  06/04/2024, 8:14 AM

## 2024-06-05 NOTE — Telephone Encounter (Signed)
 Called patient to schedule an office appointment for a pre-op clearance on 07/15/24 @ 1:55 with Orren Fabry PA

## 2024-06-10 ENCOUNTER — Other Ambulatory Visit: Payer: Self-pay

## 2024-06-12 ENCOUNTER — Other Ambulatory Visit: Payer: Self-pay | Admitting: Internal Medicine

## 2024-06-12 DIAGNOSIS — I10 Essential (primary) hypertension: Secondary | ICD-10-CM

## 2024-06-13 ENCOUNTER — Telehealth: Payer: Self-pay | Admitting: Orthopaedic Surgery

## 2024-06-24 NOTE — Telephone Encounter (Signed)
 Returning patient's call regarding a surgery date for right total knee with Dr [name].  Patient will need medical and cardiac clearance prior to surgery. Patient has an appointment with PCP 07/09/24 and Cardiologist 07/15/2024.

## 2024-06-25 ENCOUNTER — Telehealth: Payer: Self-pay | Admitting: Orthopaedic Surgery

## 2024-06-25 NOTE — Telephone Encounter (Signed)
 Returning patient's call again.  She has left several messages regarding surgery date for right total knee arthroplasty.  Call went straight to voicemail.  Patient's medical and cardiac clearance are pending appointments for 07/09/24 and 07/15/24.   I left my name and direct number for patient to call back.

## 2024-07-04 ENCOUNTER — Encounter: Payer: Self-pay | Admitting: Internal Medicine

## 2024-07-09 ENCOUNTER — Ambulatory Visit: Admitting: Internal Medicine

## 2024-07-09 ENCOUNTER — Encounter: Payer: Self-pay | Admitting: Internal Medicine

## 2024-07-09 ENCOUNTER — Ambulatory Visit: Attending: Internal Medicine | Admitting: Internal Medicine

## 2024-07-09 VITALS — BP 133/71 | HR 90 | Temp 98.2°F | Ht 67.0 in | Wt 256.0 lb

## 2024-07-09 DIAGNOSIS — Z7985 Long-term (current) use of injectable non-insulin antidiabetic drugs: Secondary | ICD-10-CM

## 2024-07-09 DIAGNOSIS — I10 Essential (primary) hypertension: Secondary | ICD-10-CM

## 2024-07-09 DIAGNOSIS — J40 Bronchitis, not specified as acute or chronic: Secondary | ICD-10-CM | POA: Diagnosis not present

## 2024-07-09 DIAGNOSIS — I152 Hypertension secondary to endocrine disorders: Secondary | ICD-10-CM

## 2024-07-09 DIAGNOSIS — M1711 Unilateral primary osteoarthritis, right knee: Secondary | ICD-10-CM

## 2024-07-09 DIAGNOSIS — N184 Chronic kidney disease, stage 4 (severe): Secondary | ICD-10-CM

## 2024-07-09 DIAGNOSIS — Z01818 Encounter for other preprocedural examination: Secondary | ICD-10-CM | POA: Diagnosis not present

## 2024-07-09 DIAGNOSIS — Z6841 Body Mass Index (BMI) 40.0 and over, adult: Secondary | ICD-10-CM

## 2024-07-09 DIAGNOSIS — I2510 Atherosclerotic heart disease of native coronary artery without angina pectoris: Secondary | ICD-10-CM

## 2024-07-09 DIAGNOSIS — E1159 Type 2 diabetes mellitus with other circulatory complications: Secondary | ICD-10-CM

## 2024-07-09 DIAGNOSIS — E1169 Type 2 diabetes mellitus with other specified complication: Secondary | ICD-10-CM

## 2024-07-09 LAB — POCT GLYCOSYLATED HEMOGLOBIN (HGB A1C): HbA1c, POC (controlled diabetic range): 7.3 % — AB (ref 0.0–7.0)

## 2024-07-09 MED ORDER — BENZONATATE 100 MG PO CAPS: 100.0000 mg | ORAL_CAPSULE | Freq: Three times a day (TID) | ORAL | 0 refills | Status: AC | PRN

## 2024-07-09 NOTE — Progress Notes (Signed)
 "   Patient ID: [name], female    DOB: 05/07/1951  MRN: [id number]  CC: Pre-op Exam (Pre-op exam - Rt knee replacement sx. Layvonne tessalon  refill for chronic cough/Already received flu vax)   Subjective: [name] is a 74 y.o. female who presents for pre-op eval. Her chronic medical issues include:  Pt with hx of HTN, DM, HL, depression, chronic back pain, RT breast CA (intraductal CA, ER/PR+, lumpectomy and XRT - completed 08/2017), CKD stage 3, CVA 2011 (residual weakness on LT side), CAD s/p CABG and MVR/resection of fibroelastoma, IDA.   Discussed the use of AI scribe software for clinical note transcription with the patient, who gave verbal consent to proceed.  History of Present Illness [name] is a 74 year old female with diabetes and hypertension who presents for a preoperative evaluation for a right total knee replacement.  She is scheduled for a right total knee replacement  by Dr. [name] and needs a preoperative evaluation. She has had previous surgeries under general anesthesia without complications. No recent chest pain, shortness of breath at rest or exertion, or swelling in the lower legs, except for her knee. No shortness of breath when lying down. Has upcoming appt with cardiology on January 19th for pre-op eval from them as well. Not able to walk as much due to her RT knee.   DM: Results for orders placed or performed in visit on 07/09/24  POCT glycosylated hemoglobin (Hb A1C)   Collection Time: 07/09/24  2:48 PM  Result Value Ref Range   Hemoglobin A1C     HbA1c POC (<> result, manual entry)     HbA1c, POC (prediabetic range)     HbA1c, POC (controlled diabetic range) 7.3 (A) 0.0 - 7.0 %   *Note: Due to a large number of results and/or encounters for the requested time period, some results have not been displayed. A complete set of results can be found in Results Review.   Her diabetes management includes checking blood sugars daily, with  recent readings between 97 and 119 mg/dL. She reports good dietary habits, aided by Ozempic , which has reduced her appetite. She is on Ozempic  2 mg weekly and Farxiga  5 mg daily. Her last A1c was 7.3%, up from 6.7% three months ago.  CAD/HTN: Hypertension is managed with valsartan  40 mg daily, amlodipine  10 mg daily, and furosemide  30 mg daily. Coreg  d/c in past due to symptomatic bradycardia. She checks her blood pressure at home, with readings around 130/75 mmHg. She is limiting salt intake, which she finds challenging.  She has chronic kidney disease and last saw her kidney specialist in December. She is on Farxiga . GFR has range from low 30s to the 20s.   She uses a Symbicort  inhaler twice daily for a cough that is worse at night and reports it helps significantly. No fever or productive cough. Request RF on Tesslon perles.    Patient Active Problem List   Diagnosis Date Noted   Primary osteoarthritis of right knee 05/28/2024   Pneumonia 06/26/2023   Colonic mass 06/08/2023   Chronic right shoulder pain 03/28/2023   Neck pain 03/28/2023   Primary osteoarthritis of right shoulder 08/10/2022   CAP (community acquired pneumonia) 06/22/2022   Stage 3b chronic kidney disease (CKD) (HCC) 06/22/2022   Trigger finger, left index finger 12/02/2021   DM type 2 with diabetic peripheral neuropathy (HCC) 07/26/2021   Bilateral low back pain with bilateral sciatica 07/26/2021   Gait abnormality 07/26/2021  Controlled substance agreement signed 12/31/2020   Shoulder stiffness, left 07/07/2020   Status post lumbar spine surgery for decompression of spinal cord 06/09/2020   Lumbar stenosis 05/27/2020   Spinal stenosis of lumbar region 05/21/2019   Complete tear of left rotator cuff 05/16/2019   Uncontrolled type 2 diabetes mellitus with hyperglycemia (HCC) 12/19/2018   Microalbuminuria 04/08/2018   Genetic testing 09/25/2017   Family history of breast cancer    Family history of prostate cancer     Malignant neoplasm of upper-outer quadrant of right breast in female, estrogen receptor positive (HCC) 06/26/2017   Lumbar herniated disc 04/15/2015   Abnormal nuclear stress test 04/03/2015   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   CAD (coronary artery disease), native coronary artery 03/30/2015   COPD (chronic obstructive pulmonary disease) (HCC) 12/24/2013   S/P mitral valve repair 11/16/2010   Pure hypercholesterolemia 06/29/2010   Anemia of renal disease 09/29/2006   Essential hypertension 09/29/2006   Diabetes mellitus without complication (HCC) 09/29/2006     Medications Ordered Prior to Encounter[1]  Allergies[2]  Social History   Socioeconomic History   Marital status: Married    Spouse name: Not on file   Number of children: 3   Years of education: some college   Highest education level: Not on file  Occupational History    Employer: UNEMPLOYED  Tobacco Use   Smoking status: Former    Current packs/day: 0.00    Types: Cigarettes    Quit date: 10/14/2010    Years since quitting: 13.7    Passive exposure: Never   Smokeless tobacco: Never  Vaping Use   Vaping status: Never Used  Substance and Sexual Activity   Alcohol  use: Yes    Comment: social   Drug use: Not Currently    Types: Marijuana   Sexual activity: Not Currently  Other Topics Concern   Not on file  Social History Narrative   Lives with husband, stay at home, uses cane occasionally, still active/ambulatory.    Left-handed.   No daily caffeine use.   Social Drivers of Health   Tobacco Use: Medium Risk (04/29/2024)   Patient History    Smoking Tobacco Use: Former    Smokeless Tobacco Use: Never    Passive Exposure: Never  Physicist, Medical Strain: Low Risk (02/06/2024)   Overall Financial Resource Strain (CARDIA)    Difficulty of Paying Living Expenses: Not hard at all  Food Insecurity: No Food Insecurity (02/06/2024)   Epic    Worried About Programme Researcher, Broadcasting/film/video in the Last Year: Never  true    Ran Out of Food in the Last Year: Never true  Transportation Needs: No Transportation Needs (02/06/2024)   Epic    Lack of Transportation (Medical): No    Lack of Transportation (Non-Medical): No  Physical Activity: Inactive (02/06/2024)   Exercise Vital Sign    Days of Exercise per Week: 0 days    Minutes of Exercise per Session: 0 min  Stress: No Stress Concern Present (02/06/2024)   [employer] of Occupational Health - Occupational Stress Questionnaire    Feeling of Stress: Not at all  Social Connections: Socially Integrated (02/06/2024)   Social Connection and Isolation Panel    Frequency of Communication with Friends and Family: More than three times a week    Frequency of Social Gatherings with Friends and Family: Three times a week    Attends Religious Services: More than 4 times per year    Active Member of Clubs or  Organizations: Yes    Attends Engineer, Structural: More than 4 times per year    Marital Status: Married  Catering Manager Violence: Not At Risk (02/06/2024)   Epic    Fear of Current or Ex-Partner: No    Emotionally Abused: No    Physically Abused: No    Sexually Abused: No  Depression (PHQ2-9): Low Risk (04/02/2024)   Depression (PHQ2-9)    PHQ-2 Score: 0  Alcohol  Screen: Low Risk (02/06/2024)   Alcohol  Screen    Last Alcohol  Screening Score (AUDIT): 2  Housing: Low Risk (02/06/2024)   Epic    Unable to Pay for Housing in the Last Year: No    Number of Times Moved in the Last Year: 0    Homeless in the Last Year: No  Utilities: Not At Risk (02/06/2024)   Epic    Threatened with loss of utilities: No  Health Literacy: Adequate Health Literacy (02/06/2024)   B1300 Health Literacy    Frequency of need for help with medical instructions: Never    Family History  Problem Relation Age of Onset   Colon cancer Mother        dx 49s; deceased 12   Irritable bowel syndrome Mother    Prostate cancer Father        deceased 19s   Cirrhosis  Sister        died of GI bleed associated with cirrhosis of the liver   Stroke Brother    Prostate cancer Brother 70       deceased 76   Diabetes Brother        x3   Kidney disease Brother    Cancer Brother 64       unk. type   Breast cancer Cousin        several maternal 1st and 2nd cousins with breast cancer   Rectal cancer Neg Hx    Stomach cancer Neg Hx    Esophageal cancer Neg Hx     Past Surgical History:  Procedure Laterality Date   BREAST BIOPSY Right 08/26/2019   benign   BREAST LUMPECTOMY Right    BREAST LUMPECTOMY WITH RADIOACTIVE SEED AND SENTINEL LYMPH NODE BIOPSY Right 07/20/2017   Procedure: RIGHT BREAST LUMPECTOMY WITH RADIOACTIVE SEED AND SENTINEL LYMPH NODE BIOPSY;  Surgeon: Ethyl Lenis, MD;  Location: [facility] OR;  Service: General;  Laterality: Right;   CARDIAC CATHETERIZATION  04/03/2015   Procedure: Left Heart Cath and Cors/Grafts Angiography;  Surgeon: Peter M Jordan, MD;  Location: [facility];  Service: Cardiovascular;;   CORONARY ARTERY BYPASS GRAFT  1/12   history of ankle fractures requiring surgery     LUMBAR LAMINECTOMY/DECOMPRESSION MICRODISCECTOMY Left 04/15/2015   Procedure: LUMBAR LAMINECTOMY/DECOMPRESSION MICRODISCECTOMY 1 LEVEL;  Surgeon: Reyes Budge, MD;  Location: [facility];  Service: Neurosurgery;  Laterality: Left;  Left L23 microdiskectomy   LUMBAR LAMINECTOMY/DECOMPRESSION MICRODISCECTOMY N/A 05/27/2020   Procedure: L2-3, L3-4  decompression;  Surgeon: Barbarann Oneil BROCKS, MD;  Location: [facility] OR;  Service: Orthopedics;  Laterality: N/A;   MV repair and resection of mass  1/12   RESECTION DISTAL CLAVICAL Left 05/16/2019   Procedure: RESECTION DISTAL CLAVICAL;  Surgeon: Vernetta Lonni GRADE, MD;  Location: [facility];  Service: Orthopedics;  Laterality: Left;   SHOULDER ARTHROSCOPY WITH ROTATOR CUFF REPAIR AND SUBACROMIAL DECOMPRESSION Left 05/16/2019   Procedure: SHOULDER ARTHROSCOPY WITH ROTATOR CUFF REPAIR AND SUBACROMIAL  DECOMPRESSION;  Surgeon: Vernetta Lonni GRADE, MD;  Location: [facility];  Service: Orthopedics;  Laterality: Left;   TONSILLECTOMY  1971   TOTAL ABDOMINAL HYSTERECTOMY  1978   TRIGGER FINGER RELEASE Right 12/02/2021   Procedure: RIGHT RING FINGER A-1 PULLEY RELEASE;  Surgeon: Vernetta Lonni GRADE, MD;  Location: [facility];  Service: Orthopedics;  Laterality: Right;   TRIGGER FINGER RELEASE Left 05/11/2023   Procedure: LEFT INDEX FINGER RELEASE TRIGGER FINGER;  Surgeon: Vernetta Lonni GRADE, MD;  Location:  SURGERY CENTER;  Service: Orthopedics;  Laterality: Left;   VESICOVAGINAL FISTULA CLOSURE W/ TAH      ROS: Review of Systems Negative except as stated above  PHYSICAL EXAM: BP 133/71 (BP Location: Left Arm, Patient Position: Sitting, Cuff Size: Large)   Pulse 90   Temp 98.2 F (36.8 C) (Oral)   Ht 5' 7 (1.702 m)   Wt 256 lb (116.1 kg)   SpO2 97%   BMI 40.10 kg/m   Wt Readings from Last 3 Encounters:  07/09/24 256 lb (116.1 kg)  05/28/24 254 lb (115.2 kg)  04/02/24 251 lb (113.9 kg)    Physical Exam  General appearance - alert, well appearing, obese elderly AAF and in no distress Mental status - normal mood, behavior, speech, dress, motor activity, and thought processes Mouth - mucous membranes moist, pharynx normal without lesions Neck - supple, no significant adenopathy Lymphatics - no palpable lymphadenopathy, no hepatosplenomegaly Chest - clear to auscultation, no wheezes, rales or rhonchi, symmetric air entry Heart - normal rate, regular rhythm, normal S1, S2, no murmurs, rubs, clicks or gallops Abdomen - soft, nontender, nondistended, no masses or organomegaly Musculoskeletal - ambulates with cane Extremities - no LE edema      Latest Ref Rng & Units 11/24/2023    4:40 PM 10/27/2023   12:02 PM 10/12/2023    8:47 AM  CMP  Glucose 70 - 99 mg/dL 880  800  867   BUN 8 - 27 mg/dL 32  25  26   Creatinine 0.57 - 1.00  mg/dL 7.97  8.29  8.36   Sodium 134 - 144 mmol/L 140  145  140   Potassium 3.5 - 5.2 mmol/L 5.2  5.4  5.4   Chloride 96 - 106 mmol/L 103  109  105   CO2 20 - 29 mmol/L 19  19  18    Calcium  8.7 - 10.3 mg/dL 9.4  8.8  9.4    Lipid Panel     Component Value Date/Time   CHOL 175 09/28/2023 1009   TRIG 72 09/28/2023 1009   HDL 100 09/28/2023 1009   CHOLHDL 1.8 09/28/2023 1009   CHOLHDL 2.8 04/28/2016 1028   VLDL 19 04/28/2016 1028   LDLCALC 62 09/28/2023 1009   LDLDIRECT 101.7 07/18/2011 0901    CBC    Component Value Date/Time   WBC 7.6 07/17/2023 1614   WBC 4.7 06/29/2023 0537   RBC 4.13 07/17/2023 1614   RBC 3.38 (L) 06/29/2023 0537   HGB 9.8 (L) 07/17/2023 1614   HCT 33.0 (L) 07/17/2023 1614   PLT 253 07/17/2023 1614   MCV 80 07/17/2023 1614   MCH 23.7 (L) 07/17/2023 1614   MCH 24.6 (L) 06/29/2023 0537   MCHC 29.7 (L) 07/17/2023 1614   MCHC 30.5 06/29/2023 0537   RDW 15.2 07/17/2023 1614   LYMPHSABS 1.0 06/26/2023 1517   MONOABS 0.5 06/26/2023 1517   EOSABS 0.0 06/26/2023 1517   BASOSABS 0.1 06/26/2023 1517    ASSESSMENT AND PLAN: 1. Preoperative evaluation to rule out  surgical contraindication (Primary) Pt at moderate risk given hx of CAD, CKD and previous CVA. However, her chronic conditions are stable  though A1c has increased it is still below 7.5. pt will see cardiology next week to get pre-op clearance from cardiologist as well. Advised to hold Ozempic  for 1 full week prior to surgery.  2. Primary osteoarthritis of right knee Plan for TKR RT  3. Type 2 diabetes mellitus associated with morbid obesity (HCC) A1C slightly above goal. Pt to continue Farxiga  and Ozempic . Continue healthy eating habits - POCT glycosylated hemoglobin (Hb A1C) - CBC - Comprehensive metabolic panel with GFR - Lipid panel  4. Hypertension associated with diabetes (HCC) Slightly above goal.   5. Stage 4 chronic kidney disease (HCC) Pt stage 3-4. Recheck chem today  6.  Coronary artery disease involving native coronary artery of native heart without angina pectoris Clinically stable Continue ASA and Lipitor  80 mg daily. Keep upcoming appt with cardiology  7. Bronchitis Continue Symbicort . RF Tessalon  Perles.  - benzonatate  (TESSALON ) 100 MG capsule; Take 1 capsule (100 mg total) by mouth 3 (three) times daily as needed.  Dispense: 30 capsule; Refill: 0     Patient was given the opportunity to ask questions.  Patient verbalized understanding of the plan and was able to repeat key elements of the plan.   This documentation was completed using Paediatric nurse.  Any transcriptional errors are unintentional.  No orders of the defined types were placed in this encounter.    Requested Prescriptions   Pending Prescriptions Disp Refills   benzonatate  (TESSALON ) 100 MG capsule 30 capsule 0    Sig: Take 1 capsule (100 mg total) by mouth 3 (three) times daily as needed.    No follow-ups on file.  Barnie Louder, MD, FACP     [1]  Current Outpatient Medications on File Prior to Visit  Medication Sig Dispense Refill   Accu-Chek Softclix Lancets lancets Use to check blood sugar 3 times daily. 100 each 6   albuterol  (VENTOLIN  HFA) 108 (90 Base) MCG/ACT inhaler Inhale 2 puffs into the lungs every 6 (six) hours as needed for wheezing or shortness of breath. 6.7 g 0   Alcohol  Swabs (ALCOHOL  PREP) 70 % PADS Use to prep skin to check blood sugar three times daily. 100 each 6   amLODipine  (NORVASC ) 10 MG tablet TAKE 1 TABLET EVERY DAY 90 tablet 3   aspirin  EC 81 MG tablet Take 1 tablet (81 mg total) by mouth daily. 30 tablet 5   atorvastatin  (LIPITOR ) 80 MG tablet TAKE 1 TABLET EVERY DAY 90 tablet 3   benzonatate  (TESSALON ) 100 MG capsule Take 1 capsule (100 mg total) by mouth 3 (three) times daily as needed. 30 capsule 0   Blood Glucose Monitoring Suppl (ACCU-CHEK GUIDE) w/Device KIT Use to check blood sugar 3 times daily. 1 kit 0   Blood  Pressure Monitoring (BLOOD PRESSURE CUFF) MISC Use to check blood pressure daily. 1 each 0   budesonide -formoterol  (SYMBICORT ) 160-4.5 MCG/ACT inhaler Inhale 2 puffs into the lungs in the morning and at bedtime. 10.2 g 0   buPROPion  (WELLBUTRIN  XL) 150 MG 24 hr tablet Take 1 tablet by mouth twice daily 180 tablet 2   dapagliflozin  propanediol (FARXIGA ) 5 MG TABS tablet Take 1 tablet (5 mg total) by mouth daily before breakfast.     diclofenac  Sodium (VOLTAREN ) 1 % GEL APPLY 2 GRAMS TOPICALLY 4 (FOUR) TIMES DAILY AS NEEDED. 100 g 0   DULoxetine  (CYMBALTA )  30 MG capsule TAKE 1 CAPSULE EVERY DAY 90 capsule 3   FEROSUL 325 (65 Fe) MG tablet Take 325 mg by mouth daily with breakfast.     furosemide  (LASIX ) 20 MG tablet TAKE 1 & 1/2 (ONE & ONE-HALF) TABLETS BY MOUTH IN THE MORNING 135 tablet 0   gabapentin  (NEURONTIN ) 300 MG capsule Take 1 capsule (300 mg total) by mouth 3 (three) times daily. 90 capsule 11   glucose blood (ACCU-CHEK GUIDE TEST) test strip Use to check blood sugar 3 times daily. 100 each 6   hydrALAZINE  (APRESOLINE ) 100 MG tablet Take 1 tablet (100 mg total) by mouth 3 (three) times daily. 270 tablet 1   letrozole  (FEMARA ) 2.5 MG tablet Take 1 tablet (2.5 mg total) by mouth daily with lunch. 90 tablet 3   methocarbamol  (ROBAXIN ) 500 MG tablet TAKE 1 TABLET EVERY 8 HOURS AS NEEDED FOR MUSCLE SPASMS. 270 tablet 1   Multiple Vitamin (MULTIVITAMIN WITH MINERALS) TABS tablet Take 1 tablet by mouth daily with breakfast.     pantoprazole  (PROTONIX ) 40 MG tablet TAKE 1 TABLET EVERY DAY 90 tablet 3   Semaglutide , 2 MG/DOSE, (OZEMPIC , 2 MG/DOSE,) 8 MG/3ML SOPN Inject 2 mg into the skin once a week. 9 mL 2   traZODone  (DESYREL ) 50 MG tablet TAKE 1 TABLET AT BEDTIME AS NEEDED FOR SLEEP 90 tablet 1   valsartan  (DIOVAN ) 40 MG tablet Take 1 tablet by mouth once daily 90 tablet 0   Insulin  Pen Needle (PEN NEEDLES) 31G X 8 MM MISC Use to inject Ozempic . 100 each 1   No current facility-administered  medications on file prior to visit.  [2]  Allergies Allergen Reactions   Coreg  [Carvedilol ]     Bradycardia   "

## 2024-07-09 NOTE — Patient Instructions (Signed)
" °  VISIT SUMMARY: Today, you were seen for a preoperative evaluation for your upcoming right total knee replacement. We reviewed your medical history, including your diabetes, hypertension, chronic kidney disease, and other conditions. We discussed your current medications and made some adjustments to ensure your safety during surgery.  YOUR PLAN: -PREOPERATIVE EVALUATION FOR RIGHT TOTAL KNEE REPLACEMENT: You are scheduled for a right total knee replacement under general anesthesia. We will need to hold your Ozempic  one full week before surgery and your Farxiga  the night before surgery. Please ensure you get clearance from your cardiologist before the surgery.  -TYPE 2 DIABETES MELLITUS ASSOCIATED WITH MORBID OBESITY: Type 2 diabetes is a condition where your body does not use insulin  properly, leading to high blood sugar levels. Your A1c has increased to 7.3%, and your blood sugars range from 97 to 119 mg/dL. Continue taking Ozempic  2 mg weekly and Farxiga  5 mg daily. We encourage you to make dietary modifications to help reduce your A1c. Remember to hold Ozempic  one full week before surgery and Farxiga  the night before surgery.  -CHRONIC KIDNEY DISEASE: Chronic kidney disease means your kidneys are not working as well as they should. Continue taking Farxiga  5 mg daily. We have ordered blood tests to check your kidney function.  -HYPERTENSION: Hypertension is high blood pressure. Your blood pressure readings are within the target range. Continue your current medications and monitor your blood pressure at home.  -ATHEROSCLEROTIC HEART DISEASE: Atherosclerotic heart disease is a condition where the blood vessels supplying your heart are narrowed or blocked. Continue taking aspirin  and atorvastatin . Follow up with your cardiologist on January 19th.  BRONCHITIS:  Continue using your Symbicort  inhaler twice daily. We have refilled your Tessalon  Perles for your cough.  INSTRUCTIONS: Please follow up with  your cardiologist on January 19th for clearance before your surgery. Hold Ozempic  one full week before surgery and Farxiga  the night before surgery. Continue monitoring your blood pressure at home and make dietary modifications to help reduce your A1c. We have ordered blood tests to check your kidney function.                      Contains text generated by [employer]bridge.                                 Contains text generated by Abridge.   "

## 2024-07-10 ENCOUNTER — Ambulatory Visit: Payer: Self-pay | Admitting: Internal Medicine

## 2024-07-10 ENCOUNTER — Other Ambulatory Visit: Payer: Self-pay | Admitting: Internal Medicine

## 2024-07-10 DIAGNOSIS — G8929 Other chronic pain: Secondary | ICD-10-CM

## 2024-07-10 LAB — COMPREHENSIVE METABOLIC PANEL WITH GFR
ALT: 21 IU/L (ref 0–32)
AST: 25 IU/L (ref 0–40)
Albumin: 4.3 g/dL (ref 3.8–4.8)
Alkaline Phosphatase: 72 IU/L (ref 49–135)
BUN/Creatinine Ratio: 13 (ref 12–28)
BUN: 23 mg/dL (ref 8–27)
Bilirubin Total: 0.3 mg/dL (ref 0.0–1.2)
CO2: 22 mmol/L (ref 20–29)
Calcium: 9.4 mg/dL (ref 8.7–10.3)
Chloride: 103 mmol/L (ref 96–106)
Creatinine, Ser: 1.74 mg/dL — ABNORMAL HIGH (ref 0.57–1.00)
Globulin, Total: 2.5 g/dL (ref 1.5–4.5)
Glucose: 105 mg/dL — ABNORMAL HIGH (ref 70–99)
Potassium: 5.2 mmol/L (ref 3.5–5.2)
Sodium: 140 mmol/L (ref 134–144)
Total Protein: 6.8 g/dL (ref 6.0–8.5)
eGFR: 30 mL/min/1.73 — ABNORMAL LOW

## 2024-07-10 LAB — CBC
Hematocrit: 39.1 % (ref 34.0–46.6)
Hemoglobin: 12.2 g/dL (ref 11.1–15.9)
MCH: 25.6 pg — ABNORMAL LOW (ref 26.6–33.0)
MCHC: 31.2 g/dL — ABNORMAL LOW (ref 31.5–35.7)
MCV: 82 fL (ref 79–97)
Platelets: 304 x10E3/uL (ref 150–450)
RBC: 4.76 x10E6/uL (ref 3.77–5.28)
RDW: 14.5 % (ref 11.7–15.4)
WBC: 7.7 x10E3/uL (ref 3.4–10.8)

## 2024-07-10 LAB — LIPID PANEL
Chol/HDL Ratio: 1.9 ratio (ref 0.0–4.4)
Cholesterol, Total: 148 mg/dL (ref 100–199)
HDL: 76 mg/dL
LDL Chol Calc (NIH): 49 mg/dL (ref 0–99)
Triglycerides: 136 mg/dL (ref 0–149)
VLDL Cholesterol Cal: 23 mg/dL (ref 5–40)

## 2024-07-12 NOTE — Progress Notes (Unsigned)
 " Cardiology Office Note   Date:  07/15/2024  ID:  [name], DOB [birth year], MRN [id number] PCP: Vicci Barnie NOVAK, MD  [facility] Providers Cardiologist:  Wilbert Bihari, MD   History of Present Illness [name] is a 74 y.o. female with a past medical history of CAD and papillary fibroblastoma of the MV, status post CABG with SVG to D2 and SVG to PDA as well as resection of fibroelastoma and MV repair by Dr. Dusty in 2012.  She has a history of hypertension, diabetes mellitus, dyslipidemia and TIA.  Last cath showed severe two-vessel CAD with patent SVG to PDA and SVG to D2 with normal LVEF.  Was admitted to [facility] a few weeks prior to her appointment due to severe fatigue and flulike symptoms/AKI/UTI and anemia and was found to be very bradycardic.  Carvedilol  was stopped and her bradycardia resolved.  2D echo at that time reported as normal LVEF and normal valves.  At her last follow-up appointment she was doing well.  Feeling much better off the carvedilol .  Denied any chest pain or pressure, SOB, DOE, PND, orthopnea, dizziness, palpitations, or syncope.  Had been having a lot of lower extremity edema.  Compliance with medications and tolerating without side effects.  Today, she presents with a hx of aortic stenosis for pre-operative clearance for knee replacement surgery. She lives with her husband, who assists with household tasks and ensures she does not overexert herself.  She is awaiting knee replacement surgery but cannot proceed until she receives medical clearance.  She has aortic stenosis and previously had marked hypotension and fatigue on Covadarol that required hospitalization and urgent care. Since the drug was stopped she has had no further low blood pressure or syncope. She takes aspirin  81 mg as her only blood thinner.  She can walk from room to room and climb stairs one step at a time using a breathing technique. She cannot walk 1 to 2 blocks on level  ground and does not do yard work or recreational activities. Her husband performs most household tasks.  She had sudden onset excruciating shoulder pain that raised her blood pressure during the episode. She is taking gabapentin  for pain and is not using ibuprofen  or other additional pain medications  Reports no shortness of breath nor dyspnea on exertion. Reports no chest pain, pressure, or tightness. No edema, orthopnea, PND. Reports no palpitations.   Discussed the use of AI scribe software for clinical note transcription with the patient, who gave verbal consent to proceed.  ROS: pertinent ROS in HPI  Studies Reviewed EKG Interpretation Date/Time:  Monday July 15 2024 14:35:08 EST Ventricular Rate:  91 PR Interval:  196 QRS Duration:  82 QT Interval:  364 QTC Calculation: 447 R Axis:   -5  Text Interpretation: Normal sinus rhythm T wave abnormality, consider lateral ischemia When compared with ECG of 26-Jun-2023 15:56, PREVIOUS ECG IS PRESENT Confirmed by Lucien Blanc [phone]) on 07/15/2024 3:10:07 PM   Echo 06/27/23  IMPRESSIONS     1. Left ventricular ejection fraction, by estimation, is 60 to 65%. The  left ventricle has normal function. The left ventricle has no regional  wall motion abnormalities. There is mild concentric left ventricular  hypertrophy. Left ventricular diastolic  parameters are indeterminate.   2. Right ventricular systolic function is normal. The right ventricular  size is normal. There is moderately elevated pulmonary artery systolic  pressure.   3. The mitral valve is normal in structure. Trivial  mitral valve  regurgitation. No evidence of mitral stenosis. There is a present in the  mitral position.   4. The aortic valve is normal in structure. There is mild calcification  of the aortic valve. Aortic valve regurgitation is not visualized. No  aortic stenosis is present.   5. The inferior vena cava is normal in size with greater than 50%   respiratory variability, suggesting right atrial pressure of 3 mmHg.   Physical Exam VS:  BP (!) 162/62   Pulse 91   Ht 5' 7 (1.702 m)   Wt 256 lb (116.1 kg)   SpO2 95%   BMI 40.10 kg/m      ** She has not taken her BP medications today  Wt Readings from Last 3 Encounters:  07/15/24 256 lb (116.1 kg)  07/09/24 256 lb (116.1 kg)  05/28/24 254 lb (115.2 kg)    GEN: Well nourished, well developed in no acute distress NECK: No JVD; No carotid bruits CARDIAC: RRR, no murmurs, rubs, gallops RESPIRATORY:  Clear to auscultation without rales, wheezing or rhonchi  ABDOMEN: Soft, non-tender, non-distended EXTREMITIES:  No edema; No deformity   ASSESSMENT AND PLAN  Preop clearance  [name] perioperative risk of a major cardiac event is 11% according to the Revised Cardiac Risk Index (RCRI).  Therefore, she is at high risk for perioperative complications.   Her functional capacity is fair at 4.73 METs according to the Duke Activity Status Index (DASI). Recommendations: According to ACC/AHA guidelines, no further cardiovascular testing needed.  The patient may proceed to surgery at acceptable risk.   Antiplatelet and/or Anticoagulation Recommendations: Aspirin  can be held for 5-7 days prior to her surgery.  Please resume Aspirin  post operatively when it is felt to be safe from a bleeding standpoint.    Nonrheumatic aortic valve stenosis No current symptoms. Blood pressure 152/62, slightly elevated. - Rechecked blood pressure.  Coronary artery disease No current symptoms. Blood pressure 152/62, slightly elevated. - Rechecked blood pressure.  Essential hypertension Blood pressure 152/62, slightly elevated. Pain may contribute to elevation. Home readings in 130s. She also has not taken her BP medication today. - Rechecked blood pressure. - Monitor blood pressure at home.   Dispo: She can return in 1 year with MD  Signed, Orren LOISE Fabry, PA-C   "

## 2024-07-15 ENCOUNTER — Ambulatory Visit: Payer: Self-pay | Attending: Physician Assistant | Admitting: Physician Assistant

## 2024-07-15 ENCOUNTER — Encounter: Payer: Self-pay | Admitting: Physician Assistant

## 2024-07-15 VITALS — BP 162/62 | HR 91 | Ht 67.0 in | Wt 256.0 lb

## 2024-07-15 DIAGNOSIS — I10 Essential (primary) hypertension: Secondary | ICD-10-CM

## 2024-07-15 DIAGNOSIS — Z9889 Other specified postprocedural states: Secondary | ICD-10-CM

## 2024-07-15 DIAGNOSIS — E1142 Type 2 diabetes mellitus with diabetic polyneuropathy: Secondary | ICD-10-CM

## 2024-07-15 DIAGNOSIS — I2510 Atherosclerotic heart disease of native coronary artery without angina pectoris: Secondary | ICD-10-CM | POA: Diagnosis not present

## 2024-07-15 DIAGNOSIS — I350 Nonrheumatic aortic (valve) stenosis: Secondary | ICD-10-CM | POA: Diagnosis not present

## 2024-07-15 DIAGNOSIS — I272 Pulmonary hypertension, unspecified: Secondary | ICD-10-CM

## 2024-07-15 NOTE — Patient Instructions (Signed)
 Medication Instructions:  None  *If you need a refill on your cardiac medications before your next appointment, please call your pharmacy*  Lab Work: None  If you have labs (blood work) drawn today and your tests are completely normal, you will receive your results only by: MyChart Message (if you have MyChart) OR A paper copy in the mail If you have any lab test that is abnormal or we need to change your treatment, we will call you to review the results.  Testing/Procedures: None   Follow-Up: At [facility], you and your health needs are our priority.  As part of our continuing mission to provide you with exceptional heart care, our providers are all part of one team.  This team includes your primary Cardiologist (physician) and Advanced Practice Providers or APPs (Physician Assistants and Nurse Practitioners) who all work together to provide you with the care you need, when you need it.  Your next appointment:   1 year(s)  Provider:   Wilbert Bihari, MD    We recommend signing up for the patient portal called MyChart.  Sign up information is provided on this After Visit Summary.  MyChart is used to connect with patients for Virtual Visits (Telemedicine).  Patients are able to view lab/test results, encounter notes, upcoming appointments, etc.  Non-urgent messages can be sent to your provider as well.   To learn more about what you can do with MyChart, go to [email or web address].   Other Instructions None

## 2024-07-30 ENCOUNTER — Other Ambulatory Visit: Payer: Self-pay | Admitting: Physician Assistant

## 2024-07-30 MED ORDER — ONDANSETRON HCL 4 MG PO TABS: 4.0000 mg | ORAL_TABLET | Freq: Three times a day (TID) | ORAL | 0 refills | Status: AC | PRN

## 2024-07-30 MED ORDER — METHOCARBAMOL 500 MG PO TABS: 500.0000 mg | ORAL_TABLET | Freq: Two times a day (BID) | ORAL | 2 refills | Status: DC | PRN

## 2024-07-30 MED ORDER — APIXABAN 2.5 MG PO TABS: ORAL_TABLET | ORAL | 0 refills | Status: AC

## 2024-07-30 MED ORDER — DOCUSATE SODIUM 100 MG PO CAPS: 100.0000 mg | ORAL_CAPSULE | Freq: Every day | ORAL | 2 refills | Status: AC | PRN

## 2024-07-30 MED ORDER — DOXYCYCLINE HYCLATE 100 MG PO TABS: 100.0000 mg | ORAL_TABLET | Freq: Two times a day (BID) | ORAL | 0 refills | Status: AC

## 2024-07-30 MED ORDER — OXYCODONE-ACETAMINOPHEN 5-325 MG PO TABS: 1.0000 | ORAL_TABLET | Freq: Three times a day (TID) | ORAL | 0 refills | Status: AC | PRN

## 2024-08-01 NOTE — Progress Notes (Signed)
 Date of any COVID positive Test in last 90 days:  PCP -  Barnie Louder, MD clearance scanned to [facility] 07-11-24 Cardiologist - Wilbert Bihari, MD, Orren Fabry, [place] cardiac clearance 07-15-2024 Nephrology-  Jayson Player, MD at [facility] Oncology- Mackey Chad, MD   Chest x-ray - 06-26-2023  2v EKG -07-15-2024     Stress Test - Lexiscan   03-03-2020 ECHO - 06-27-2023  Cardiac Cath - 04-03-2015 [facility] / [facility] by Dr. Jordan CT Coronary Calcium  score:  ZIO monitor-   Pacemaker / ICD device [x]  No []  Yes   Spinal Cord Stimulator:[x]  No []  Yes       History of Sleep Apnea? []  No [x]  Yes   CPAP used?- [x]  No []  Yes    Medication on DOS: Amlodipine  (NORVASC ), hydralazine  (APRESOLINE ), Pantoprazole  (PROTONIX ), letrozole  (FEMARA ), duloxetine  (CYMBALTA ), bupropion  (WELLBUTRIN ), Gabapentin  (NEURONTIN ), budesonide -formoterol  (SYMBICORT ) inhaler, albuterol  (VENTOLIN ) inhaler,   Hold DOS: Valsartan  (DIOVAN ), furosemide  (LASIX )  Patient has: []  NO Hx DM   []  Pre-DM   []  DM1  [x]   DM2 Does the patient monitor blood sugar?   []  N/A   []  No [x]  Yes  Last A1c was:  7.3  on   07-09-2024   Does patient have a [employer] or Dexcom? []  No []  Yes   Fasting Blood Sugar Ranges-  Checks Blood Sugar _____ times a day  Semaglutide  (OZEMPIC ):  Dapagliflozin  (FARXIGA )   Blood Thinner / Instructions: Aspirin  Instructions:  ASA 81 mg  hold x 5-7 days  Activity level: Able to walk up 2 flights of stairs without becoming significantly short of breath or having chest pain?   []    Yes   []  No,  would have:  Patient can perform ADLs without assistance.  []   Yes    []  No   Comments:   Anesthesia review: CAD s/p CABG / MVR 2012, hx TIA, OSA- no CPAP, CKD3b, Resistant HTN, COPD, GERD,   Patient denies any S&S of respiratory illness or Covid - no shortness of breath, fever, cough or chest pain at PAT appointment.  Patient verbalized understanding and agreement to the Pre-Surgical Instructions that were given to  them at this PAT appointment. Patient was also educated of the need to review these PAT instructions again prior to her surgery.I reviewed the appropriate phone numbers to call if they have any and questions or concerns.

## 2024-08-01 NOTE — Patient Instructions (Signed)
 SURGICAL WAITING ROOM VISITATION Patients having surgery or a procedure may have no more than 2 support people in the waiting area - these visitors may rotate in the visitor waiting room.   If the patient needs to stay at the hospital during part of their recovery, the visitor guidelines for inpatient rooms apply.  PRE-OP VISITATION  Pre-op nurse will coordinate an appropriate time for 1 support person to accompany the patient in pre-op.  This support person may not rotate.  This visitor will be contacted when the time is appropriate for the visitor to come back in the pre-op area.  Please refer to the [facility] website for the visitor guidelines for Inpatients (after your surgery is over and you are in a regular room).  Temporary Visitor Restrictions  Children ages 79 and under will not be able to visit patients in [facility] under most circumstances. Visitation is not restricted outside of hospitals unless noted otherwise in the [facility] and Location Specific Visitation Guidelines at :      [email or web address] Visitors with respiratory illnesses are discouraged from visiting and should remain at home.  You are not required to quarantine at this time prior to your surgery. However, you must do this: Hand Hygiene often Do NOT share personal items Notify your provider if you are in close contact with someone who has COVID or you develop fever 100.4 or greater, new onset of sneezing, cough, sore throat, shortness of breath or body aches.  If you test positive for Covid or have been in contact with anyone that has tested positive in the last 10 days please notify you surgeon.    Your procedure is scheduled on:  FRIDAY  08-09-2024  Report to [facility] Main Entrance: [address] entrance where the [employer] is available.   Report to admitting at:  05:15   AM  Call this number if you have any questions or problems the morning of surgery [phone]  Do not eat food  after Midnight the night prior to your surgery/procedure.  After Midnight you may have the following liquids until   04:30  AM  DAY OF SURGERY  Clear Liquid Diet Water Black Coffee (sugar ok, NO MILK/CREAM OR CREAMERS)  Tea (sugar ok, NO MILK/CREAM OR CREAMERS) regular and decaf                             Plain Jell-O  with no fruit (NO RED)                                           Fruit ices (not with fruit pulp, NO RED)                                     Popsicles (NO RED)                                                                  Juice: NO CITRUS JUICES: only apple, WHITE grape, WHITE cranberry Sports drinks like Gatorade or Powerade (NO  RED)               FOLLOW ANY ADDITIONAL PRE OP INSTRUCTIONS YOU RECEIVED FROM YOUR SURGEON'S OFFICE!!!   Oral Hygiene is also important to reduce your risk of infection.        Remember - BRUSH YOUR TEETH THE MORNING OF SURGERY WITH YOUR REGULAR TOOTHPASTE  Do NOT smoke after Midnight the night before surgery.  Semaglutide  (OZEMPIC ):   stop taking 7-10- days before your surgery.  Last injection will be on:  Dapagliflozin  (FARXIGA ) stop taking 72 hours before surgery. Last dose will be taken on Monday 08-05-2024  Blood Thinner / Instructions: Aspirin  Instructions:  ASA 81 mg  hold x 5-7 days   STOP TAKING all Vitamins, Herbs and supplements 1 week before your surgery.   Take ONLY these medicines the morning of surgery with A SIP OF WATER:  Amlodipine  (NORVASC ), hydralazine  (APRESOLINE ), Pantoprazole  (PROTONIX ), letrozole  (FEMARA ), duloxetine  (CYMBALTA ), bupropion  (WELLBUTRIN ), Gabapentin  (NEURONTIN ), budesonide -formoterol  (SYMBICORT ) inhaler, albuterol  (VENTOLIN ) inhaler,   DO NOT TAKE  Valsartan  (DIOVAN ), furosemide  (LASIX ) the morning of your surgery.   You may not have any metal on your body including hair pins, jewelry, and body piercing  Do not wear make-up, lotions, powders, perfumes or deodorant  Do not wear nail polish  including gel and S&S, artificial / acrylic nails, or any other type of covering on natural nails including finger and toenails. If you have artificial nails, gel coating, etc., that needs to be removed by a nail salon, Please have this removed prior to surgery. Not doing so may mean that your surgery could be cancelled or delayed if the Surgeon or anesthesia staff feels like they are unable to monitor you safely.   Do not shave 48 hours prior to surgery to avoid nicks in your skin which may contribute to postoperative infections.   Contacts, Hearing Aids, dentures or bridgework may not be worn into surgery. DENTURES WILL BE REMOVED PRIOR TO SURGERY PLEASE DO NOT APPLY Poly grip OR ADHESIVES!!!  You may bring a small overnight bag with you on the day of surgery, only pack items that are not valuable. [facility] IS NOT RESPONSIBLE   FOR VALUABLES THAT ARE LOST OR STOLEN.   Do not bring your home medications to the hospital. The Pharmacy will dispense medications listed on your medication list to you during your admission in the Hospital.  Special Instructions: Bring a copy of your healthcare power of attorney and living will documents the day of surgery, if you wish to have them scanned into your [facility] Records- EPIC  Please read over the following fact sheets you were given: IF YOU HAVE QUESTIONS ABOUT YOUR PRE-OP INSTRUCTIONS, PLEASE CALL [phone].      Pre-operative 4 CHG Bath Instructions   You can play a key role in reducing the risk of infection after surgery. Your skin needs to be as free of germs as possible. You can reduce the number of germs on your skin by washing with CHG (chlorhexidine  gluconate) soap before surgery. CHG is an antiseptic soap that kills germs and continues to kill germs even after washing.   DO NOT use if you have an allergy to chlorhexidine /CHG or antibacterial soaps. If your skin becomes reddened or irritated, stop using the CHG and notify one  of our RNs at [phone]  Please shower with the CHG soap starting 4 days before surgery using the following schedule:   [facility]  08-05-2024     Do NOT use  CHG soap                                                                                                                      the morning of your                                                                                                                                 surgery.         Please keep in mind the following:  DO NOT shave, including legs and underarms, starting the day of your first shower.   You may shave your face at any point before/day of surgery.  Place clean sheets on your bed the day you start using CHG soap. Use a clean washcloth (not used since being washed) for each shower. DO NOT sleep with pets once you start using the CHG.  CHG Shower Instructions:  If you choose to wash your hair and private area, wash first with your normal shampoo/soap.  After you use shampoo/soap, rinse your hair and body thoroughly to remove shampoo/soap residue.  Turn the water OFF and apply about 3 tablespoons (45 ml) of CHG soap to a CLEAN washcloth.  Apply CHG soap ONLY FROM YOUR NECK DOWN TO YOUR TOES (washing for 3-5 minutes)  DO NOT use CHG soap on face, private areas, open wounds, or sores.  Pay special attention to the area where your surgery is being performed.  If you are having back surgery, having someone wash your back for you may be helpful. Wait 2 minutes after CHG soap is applied, then you may rinse off the CHG soap.  Pat dry with a clean towel  Put on clean clothes/pajamas   If you choose to wear lotion, please use ONLY the CHG-compatible lotions on the back of this paper.     Additional instructions for the day of surgery: DO NOT APPLY any CHG Soap,  lotions, deodorants, cologne, or perfumes on the day of surgery  Put on clean/comfortable clothes.  Brush your teeth.  Ask your nurse before applying any prescription  medications to the skin.   CHG Compatible Lotions   Aveeno Moisturizing lotion  Cetaphil Moisturizing Cream  Cetaphil Moisturizing Lotion  Clairol Herbal Essence Moisturizing Lotion, Dry Skin  Clairol Herbal Essence Moisturizing Lotion, Extra Dry Skin  Clairol Herbal Essence Moisturizing Lotion, Normal Skin  Curel Age Defying Therapeutic Moisturizing Lotion with Alpha Hydroxy  Curel Extreme  Care Body Lotion  Curel Soothing Hands Moisturizing Hand Lotion  Curel Therapeutic Moisturizing Cream, Fragrance-Free  Curel Therapeutic Moisturizing Lotion, Fragrance-Free  Curel Therapeutic Moisturizing Lotion, Original Formula  Eucerin Daily Replenishing Lotion  Eucerin Dry Skin Therapy Plus Alpha Hydroxy Crme  Eucerin Dry Skin Therapy Plus Alpha Hydroxy Lotion  Eucerin Original Crme  Eucerin Original Lotion  Eucerin Plus Crme Eucerin Plus Lotion  Eucerin TriLipid Replenishing Lotion  Keri Anti-Bacterial Hand Lotion  Keri Deep Conditioning Original Lotion Dry Skin Formula Softly Scented  Keri Deep Conditioning Original Lotion, Fragrance Free Sensitive Skin Formula  Keri Lotion Fast Absorbing Fragrance Free Sensitive Skin Formula  Keri Lotion Fast Absorbing Softly Scented Dry Skin Formula  Keri Original Lotion  Keri Skin Renewal Lotion Keri Silky Smooth Lotion  Keri Silky Smooth Sensitive Skin Lotion  Nivea Body Creamy Conditioning Oil  Nivea Body Extra Enriched Lotion  Nivea Body Original Lotion  Nivea Body Sheer Moisturizing Lotion Nivea Crme  Nivea Skin Firming Lotion  NutraDerm 30 Skin Lotion  NutraDerm Skin Lotion  NutraDerm Therapeutic Skin Cream  NutraDerm Therapeutic Skin Lotion  ProShield Protective Hand Cream  Provon moisturizing lotion   FAILURE TO FOLLOW THESE INSTRUCTIONS MAY RESULT IN THE CANCELLATION OF YOUR SURGERY  PATIENT SIGNATURE_________________________________  NURSE  SIGNATURE__________________________________  ________________________________________________________________________         Nasario Exon    An incentive spirometer is a tool that can help keep your lungs clear and active. This tool measures how well you are filling your lungs with each breath. Taking long deep breaths may help reverse or decrease the chance of developing breathing (pulmonary) problems (especially infection) following: A long period of time when you are unable to move or be active. BEFORE THE PROCEDURE  If the spirometer includes an indicator to show your best effort, your nurse or respiratory therapist will set it to a desired goal. If possible, sit up straight or lean slightly forward. Try not to slouch. Hold the incentive spirometer in an upright position. INSTRUCTIONS FOR USE  Sit on the edge of your bed if possible, or sit up as far as you can in bed or on a chair. Hold the incentive spirometer in an upright position. Breathe out normally. Place the mouthpiece in your mouth and seal your lips tightly around it. Breathe in slowly and as deeply as possible, raising the piston or the ball toward the top of the column. Hold your breath for 3-5 seconds or for as long as possible. Allow the piston or ball to fall to the bottom of the column. Remove the mouthpiece from your mouth and breathe out normally. Rest for a few seconds and repeat Steps 1 through 7 at least 10 times every 1-2 hours when you are awake. Take your time and take a few normal breaths between deep breaths. The spirometer may include an indicator to show your best effort. Use the indicator as a goal to work toward during each repetition. After each set of 10 deep breaths, practice coughing to be sure your lungs are clear. If you have an incision (the cut made at the time of surgery), support your incision when coughing by placing a pillow or rolled up towels firmly against it. Once you are able  to get out of bed, walk around indoors and cough well. You may stop using the incentive spirometer when instructed by your caregiver.  RISKS AND COMPLICATIONS Take your time so you do not get dizzy or light-headed. If you are in pain, you  may need to take or ask for pain medication before doing incentive spirometry. It is harder to take a deep breath if you are having pain. AFTER USE Rest and breathe slowly and easily. It can be helpful to keep track of a log of your progress. Your caregiver can provide you with a simple table to help with this. If you are using the spirometer at home, follow these instructions: SEEK MEDICAL CARE IF:  You are having difficultly using the spirometer. You have trouble using the spirometer as often as instructed. Your pain medication is not giving enough relief while using the spirometer. You develop fever of 100.5 F (38.1 C) or higher.                                                                                                    SEEK IMMEDIATE MEDICAL CARE IF:  You cough up bloody sputum that had not been present before. You develop fever of 102 F (38.9 C) or greater. You develop worsening pain at or near the incision site. MAKE SURE YOU:  Understand these instructions. Will watch your condition. Will get help right away if you are not doing well or get worse. Document Released: 10/24/2006 Document Revised: 09/05/2011 Document Reviewed: 12/25/2006 [facility] Patient Information 2014 [place], [place].        If you would like to see a video about joint replacement:   [email or web address]

## 2024-08-02 ENCOUNTER — Other Ambulatory Visit: Payer: Self-pay

## 2024-08-02 ENCOUNTER — Encounter (HOSPITAL_COMMUNITY): Payer: Self-pay

## 2024-08-02 ENCOUNTER — Encounter (HOSPITAL_COMMUNITY): Admission: RE | Admit: 2024-08-02 | Source: Ambulatory Visit

## 2024-08-02 VITALS — BP 162/70 | HR 88 | Temp 98.5°F | Resp 22 | Ht 68.0 in | Wt 252.0 lb

## 2024-08-02 DIAGNOSIS — M1711 Unilateral primary osteoarthritis, right knee: Secondary | ICD-10-CM

## 2024-08-02 DIAGNOSIS — Z79899 Other long term (current) drug therapy: Secondary | ICD-10-CM

## 2024-08-02 DIAGNOSIS — E1165 Type 2 diabetes mellitus with hyperglycemia: Secondary | ICD-10-CM

## 2024-08-02 DIAGNOSIS — Z01818 Encounter for other preprocedural examination: Secondary | ICD-10-CM

## 2024-08-02 HISTORY — DX: Chronic kidney disease, unspecified: N18.9

## 2024-08-02 HISTORY — DX: Pneumonia, unspecified organism: J18.9

## 2024-08-02 LAB — CBC
HCT: 42.5 % (ref 36.0–46.0)
Hemoglobin: 12.9 g/dL (ref 12.0–15.0)
MCH: 25.7 pg — ABNORMAL LOW (ref 26.0–34.0)
MCHC: 30.4 g/dL (ref 30.0–36.0)
MCV: 84.7 fL (ref 80.0–100.0)
Platelets: 320 10*3/uL (ref 150–400)
RBC: 5.02 MIL/uL (ref 3.87–5.11)
RDW: 16.3 % — ABNORMAL HIGH (ref 11.5–15.5)
WBC: 7.2 10*3/uL (ref 4.0–10.5)
nRBC: 0 % (ref 0.0–0.2)

## 2024-08-02 LAB — COMPREHENSIVE METABOLIC PANEL WITH GFR
ALT: 28 U/L (ref 0–44)
AST: 34 U/L (ref 15–41)
Albumin: 4.1 g/dL (ref 3.5–5.0)
Alkaline Phosphatase: 71 U/L (ref 38–126)
Anion gap: 10 (ref 5–15)
BUN: 25 mg/dL — ABNORMAL HIGH (ref 8–23)
CO2: 25 mmol/L (ref 22–32)
Calcium: 9.8 mg/dL (ref 8.9–10.3)
Chloride: 105 mmol/L (ref 98–111)
Creatinine, Ser: 1.68 mg/dL — ABNORMAL HIGH (ref 0.44–1.00)
GFR, Estimated: 31 mL/min — ABNORMAL LOW
Glucose, Bld: 150 mg/dL — ABNORMAL HIGH (ref 70–99)
Potassium: 5.2 mmol/L — ABNORMAL HIGH (ref 3.5–5.1)
Sodium: 140 mmol/L (ref 135–145)
Total Bilirubin: 0.4 mg/dL (ref 0.0–1.2)
Total Protein: 7.5 g/dL (ref 6.5–8.1)

## 2024-08-02 LAB — SURGICAL PCR SCREEN
MRSA, PCR: NEGATIVE
Staphylococcus aureus: NEGATIVE

## 2024-08-02 LAB — GLUCOSE, CAPILLARY: Glucose-Capillary: 152 mg/dL — ABNORMAL HIGH (ref 70–99)

## 2024-08-09 ENCOUNTER — Encounter (HOSPITAL_COMMUNITY): Admission: RE | Payer: Self-pay | Source: Home / Self Care

## 2024-08-09 ENCOUNTER — Ambulatory Visit (HOSPITAL_COMMUNITY): Admission: RE | Admit: 2024-08-09 | Admitting: Orthopaedic Surgery

## 2024-08-09 DIAGNOSIS — M1711 Unilateral primary osteoarthritis, right knee: Secondary | ICD-10-CM

## 2024-08-09 SURGERY — ARTHROPLASTY, KNEE, TOTAL
Anesthesia: Spinal | Site: Knee | Laterality: Right

## 2024-08-16 ENCOUNTER — Encounter: Payer: Self-pay | Admitting: Internal Medicine

## 2024-08-23 ENCOUNTER — Encounter: Admitting: Physician Assistant

## 2024-10-07 ENCOUNTER — Ambulatory Visit: Payer: Self-pay | Admitting: Internal Medicine

## 2025-02-11 ENCOUNTER — Ambulatory Visit

## 2025-06-02 ENCOUNTER — Other Ambulatory Visit (HOSPITAL_COMMUNITY)
